# Patient Record
Sex: Female | Born: 1935 | Race: White | Hispanic: No | Smoking: Former smoker
Health system: Southern US, Community
[De-identification: ages and names within clinical notes are randomized; demographics above are authoritative.]

## PROBLEM LIST (undated history)

## (undated) DIAGNOSIS — K5730 Diverticulosis of large intestine without perforation or abscess without bleeding: Secondary | ICD-10-CM

## (undated) DIAGNOSIS — T4145XA Adverse effect of unspecified anesthetic, initial encounter: Secondary | ICD-10-CM

## (undated) DIAGNOSIS — E785 Hyperlipidemia, unspecified: Secondary | ICD-10-CM

## (undated) DIAGNOSIS — L719 Rosacea, unspecified: Secondary | ICD-10-CM

## (undated) DIAGNOSIS — C50911 Malignant neoplasm of unspecified site of right female breast: Secondary | ICD-10-CM

## (undated) DIAGNOSIS — G479 Sleep disorder, unspecified: Secondary | ICD-10-CM

## (undated) DIAGNOSIS — Z9889 Other specified postprocedural states: Secondary | ICD-10-CM

## (undated) DIAGNOSIS — K317 Polyp of stomach and duodenum: Secondary | ICD-10-CM

## (undated) DIAGNOSIS — M8580 Other specified disorders of bone density and structure, unspecified site: Secondary | ICD-10-CM

## (undated) DIAGNOSIS — K219 Gastro-esophageal reflux disease without esophagitis: Secondary | ICD-10-CM

## (undated) DIAGNOSIS — C439 Malignant melanoma of skin, unspecified: Secondary | ICD-10-CM

## (undated) DIAGNOSIS — K6389 Other specified diseases of intestine: Secondary | ICD-10-CM

## (undated) DIAGNOSIS — M818 Other osteoporosis without current pathological fracture: Secondary | ICD-10-CM

## (undated) DIAGNOSIS — D125 Benign neoplasm of sigmoid colon: Secondary | ICD-10-CM

## (undated) DIAGNOSIS — K5900 Constipation, unspecified: Secondary | ICD-10-CM

## (undated) DIAGNOSIS — T8859XA Other complications of anesthesia, initial encounter: Secondary | ICD-10-CM

## (undated) DIAGNOSIS — K5790 Diverticulosis of intestine, part unspecified, without perforation or abscess without bleeding: Secondary | ICD-10-CM

## (undated) DIAGNOSIS — Z170 Estrogen receptor positive status [ER+]: Secondary | ICD-10-CM

## (undated) DIAGNOSIS — G4730 Sleep apnea, unspecified: Secondary | ICD-10-CM

## (undated) DIAGNOSIS — M1990 Unspecified osteoarthritis, unspecified site: Secondary | ICD-10-CM

## (undated) DIAGNOSIS — T386X5A Adverse effect of antigonadotrophins, antiestrogens, antiandrogens, not elsewhere classified, initial encounter: Secondary | ICD-10-CM

## (undated) DIAGNOSIS — R12 Heartburn: Secondary | ICD-10-CM

## (undated) DIAGNOSIS — C801 Malignant (primary) neoplasm, unspecified: Secondary | ICD-10-CM

## (undated) DIAGNOSIS — G629 Polyneuropathy, unspecified: Secondary | ICD-10-CM

## (undated) DIAGNOSIS — R112 Nausea with vomiting, unspecified: Secondary | ICD-10-CM

## (undated) DIAGNOSIS — I10 Essential (primary) hypertension: Secondary | ICD-10-CM

## (undated) DIAGNOSIS — R351 Nocturia: Secondary | ICD-10-CM

## (undated) DIAGNOSIS — S72009A Fracture of unspecified part of neck of unspecified femur, initial encounter for closed fracture: Secondary | ICD-10-CM

## (undated) DIAGNOSIS — C186 Malignant neoplasm of descending colon: Secondary | ICD-10-CM

## (undated) DIAGNOSIS — C189 Malignant neoplasm of colon, unspecified: Secondary | ICD-10-CM

## (undated) HISTORY — DX: Adverse effect of antigonadotrophins, antiestrogens, antiandrogens, not elsewhere classified, initial encounter: T38.6X5A

## (undated) HISTORY — DX: Heartburn: R12

## (undated) HISTORY — DX: Benign neoplasm of sigmoid colon: D12.5

## (undated) HISTORY — DX: Malignant melanoma of skin, unspecified: C43.9

## (undated) HISTORY — DX: Malignant neoplasm of descending colon: C18.6

## (undated) HISTORY — PX: CHOLECYSTECTOMY: SHX55

## (undated) HISTORY — DX: Malignant neoplasm of colon, unspecified: C18.9

## (undated) HISTORY — DX: Constipation, unspecified: K59.00

## (undated) HISTORY — DX: Diverticulosis of intestine, part unspecified, without perforation or abscess without bleeding: K57.90

## (undated) HISTORY — DX: Diverticulosis of large intestine without perforation or abscess without bleeding: K57.30

## (undated) HISTORY — PX: BREAST SURGERY: SHX581

## (undated) HISTORY — PX: JOINT REPLACEMENT: SHX530

## (undated) HISTORY — DX: Other specified diseases of intestine: K63.89

## (undated) HISTORY — DX: Polyp of stomach and duodenum: K31.7

## (undated) HISTORY — DX: Estrogen receptor positive status (ER+): Z17.0

## (undated) HISTORY — DX: Malignant neoplasm of unspecified site of right female breast: C50.911

## (undated) HISTORY — DX: Other osteoporosis without current pathological fracture: M81.8

## (undated) HISTORY — DX: Essential (primary) hypertension: I10

## (undated) HISTORY — DX: Polyneuropathy, unspecified: G62.9

---
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
## 1971-06-01 HISTORY — PX: BACK SURGERY: SHX140

## 1996-05-31 HISTORY — PX: MASTECTOMY: SHX3

## 1999-06-01 HISTORY — PX: TOTAL KNEE ARTHROPLASTY: SHX125

## 2007-06-19 ENCOUNTER — Encounter: Admission: RE | Admit: 2007-06-19 | Discharge: 2007-06-19 | Payer: Self-pay | Admitting: Orthopedic Surgery

## 2007-12-18 ENCOUNTER — Encounter: Admission: RE | Admit: 2007-12-18 | Discharge: 2007-12-18 | Payer: Self-pay | Admitting: Orthopedic Surgery

## 2008-04-01 ENCOUNTER — Encounter: Admission: RE | Admit: 2008-04-01 | Discharge: 2008-04-01 | Payer: Self-pay | Admitting: Orthopedic Surgery

## 2008-05-31 HISTORY — PX: ABDOMINAL HYSTERECTOMY: SHX81

## 2008-05-31 HISTORY — PX: TOTAL HIP ARTHROPLASTY: SHX124

## 2008-08-12 ENCOUNTER — Inpatient Hospital Stay (HOSPITAL_COMMUNITY): Admission: RE | Admit: 2008-08-12 | Discharge: 2008-08-15 | Payer: Self-pay | Admitting: Orthopedic Surgery

## 2009-05-31 HISTORY — PX: CATARACT EXTRACTION: SUR2

## 2010-04-30 ENCOUNTER — Encounter: Payer: Self-pay | Admitting: Emergency Medicine

## 2010-04-30 ENCOUNTER — Inpatient Hospital Stay (HOSPITAL_COMMUNITY)
Admission: EM | Admit: 2010-04-30 | Discharge: 2010-05-01 | Payer: Self-pay | Source: Home / Self Care | Admitting: Emergency Medicine

## 2010-04-30 ENCOUNTER — Encounter (INDEPENDENT_AMBULATORY_CARE_PROVIDER_SITE_OTHER): Payer: Self-pay | Admitting: General Surgery

## 2010-05-31 HISTORY — PX: TUMOR REMOVAL: SHX12

## 2010-08-11 LAB — DIFFERENTIAL
Basophils Relative: 3 % — ABNORMAL HIGH (ref 0–1)
Lymphs Abs: 2.1 10*3/uL (ref 0.7–4.0)
Monocytes Absolute: 0.4 10*3/uL (ref 0.1–1.0)
Monocytes Relative: 7 % (ref 3–12)
Neutro Abs: 2.9 10*3/uL (ref 1.7–7.7)

## 2010-08-11 LAB — CBC
MCH: 28.3 pg (ref 26.0–34.0)
MCHC: 32.6 g/dL (ref 30.0–36.0)
MCHC: 33.7 g/dL (ref 30.0–36.0)
Platelets: 134 10*3/uL — ABNORMAL LOW (ref 150–400)
Platelets: 160 10*3/uL (ref 150–400)
RBC: 4.45 MIL/uL (ref 3.87–5.11)
RDW: 12.1 % (ref 11.5–15.5)
RDW: 13 % (ref 11.5–15.5)
WBC: 5.9 10*3/uL (ref 4.0–10.5)

## 2010-08-11 LAB — HEPATIC FUNCTION PANEL
Alkaline Phosphatase: 77 U/L (ref 39–117)
Bilirubin, Direct: 0.1 mg/dL (ref 0.0–0.3)
Indirect Bilirubin: 0.4 mg/dL (ref 0.3–0.9)
Total Bilirubin: 0.5 mg/dL (ref 0.3–1.2)
Total Protein: 6.4 g/dL (ref 6.0–8.3)

## 2010-08-11 LAB — URINALYSIS, ROUTINE W REFLEX MICROSCOPIC
Glucose, UA: NEGATIVE mg/dL
Nitrite: NEGATIVE
Specific Gravity, Urine: 1.02 (ref 1.005–1.030)
pH: 6 (ref 5.0–8.0)

## 2010-08-11 LAB — COMPREHENSIVE METABOLIC PANEL
Albumin: 4.4 g/dL (ref 3.5–5.2)
Alkaline Phosphatase: 118 U/L — ABNORMAL HIGH (ref 39–117)
BUN: 11 mg/dL (ref 6–23)
Calcium: 9.6 mg/dL (ref 8.4–10.5)
Potassium: 4.2 mEq/L (ref 3.5–5.1)
Sodium: 143 mEq/L (ref 135–145)
Total Protein: 7.9 g/dL (ref 6.0–8.3)

## 2010-08-11 LAB — URINE MICROSCOPIC-ADD ON

## 2010-08-11 LAB — CREATININE, SERUM: GFR calc Af Amer: >60 mL/min (ref >60)

## 2010-08-11 LAB — POTASSIUM: Potassium: 3.5 mEq/L (ref 3.5–5.1)

## 2010-09-10 LAB — BASIC METABOLIC PANEL
BUN: 6 mg/dL (ref 6–23)
CO2: 26 mEq/L (ref 19–32)
CO2: 27 mEq/L (ref 19–32)
Calcium: 8 mg/dL — ABNORMAL LOW (ref 8.4–10.5)
Calcium: 8.5 mg/dL (ref 8.4–10.5)
Chloride: 103 mEq/L (ref 96–112)
Chloride: 103 mEq/L (ref 96–112)
Chloride: 104 mEq/L (ref 96–112)
Creatinine, Ser: 0.61 mg/dL (ref 0.4–1.2)
Creatinine, Ser: 0.61 mg/dL (ref 0.4–1.2)
GFR calc Af Amer: >60 mL/min (ref >60)
GFR calc Af Amer: >60 mL/min (ref >60)
Glucose, Bld: 127 mg/dL — ABNORMAL HIGH (ref 70–99)
Glucose, Bld: 129 mg/dL — ABNORMAL HIGH (ref 70–99)
Glucose, Bld: 143 mg/dL — ABNORMAL HIGH (ref 70–99)
Potassium: 4 mEq/L (ref 3.5–5.1)
Potassium: 4.2 mEq/L (ref 3.5–5.1)
Sodium: 136 mEq/L (ref 135–145)

## 2010-09-10 LAB — URINALYSIS, ROUTINE W REFLEX MICROSCOPIC
Bilirubin Urine: NEGATIVE
Glucose, UA: NEGATIVE mg/dL
Hgb urine dipstick: NEGATIVE
Hgb urine dipstick: NEGATIVE
Ketones, ur: NEGATIVE mg/dL
Nitrite: NEGATIVE
Protein, ur: NEGATIVE mg/dL
Protein, ur: NEGATIVE mg/dL
Specific Gravity, Urine: 1.03 (ref 1.005–1.030)
Urobilinogen, UA: 1 mg/dL (ref 0.0–1.0)

## 2010-09-10 LAB — CBC
HCT: 25.1 % — ABNORMAL LOW (ref 36.0–46.0)
HCT: 31 % — ABNORMAL LOW (ref 36.0–46.0)
Hemoglobin: 10.5 g/dL — ABNORMAL LOW (ref 12.0–15.0)
Hemoglobin: 9 g/dL — ABNORMAL LOW (ref 12.0–15.0)
MCHC: 33.7 g/dL (ref 30.0–36.0)
MCHC: 33.8 g/dL (ref 30.0–36.0)
MCHC: 33.8 g/dL (ref 30.0–36.0)
MCV: 85.7 fL (ref 78.0–100.0)
MCV: 86.4 fL (ref 78.0–100.0)
MCV: 86.8 fL (ref 78.0–100.0)
Platelets: 129 10*3/uL — ABNORMAL LOW (ref 150–400)
Platelets: 149 10*3/uL — ABNORMAL LOW (ref 150–400)
RBC: 3.07 MIL/uL — ABNORMAL LOW (ref 3.87–5.11)
RBC: 3.61 MIL/uL — ABNORMAL LOW (ref 3.87–5.11)
RDW: 13.1 % (ref 11.5–15.5)
RDW: 13.4 % (ref 11.5–15.5)
RDW: 13.5 % (ref 11.5–15.5)
RDW: 13.1 % (ref 11.5–15.5)
WBC: 6.3 10*3/uL (ref 4.0–10.5)

## 2010-09-10 LAB — COMPREHENSIVE METABOLIC PANEL
ALT: 13 U/L (ref 0–35)
AST: 16 U/L (ref 0–37)
Albumin: 4 g/dL (ref 3.5–5.2)
Alkaline Phosphatase: 87 U/L (ref 39–117)
Chloride: 105 mEq/L (ref 96–112)
Potassium: 4 mEq/L (ref 3.5–5.1)
Sodium: 140 mEq/L (ref 135–145)
Total Protein: 7 g/dL (ref 6.0–8.3)

## 2010-09-10 LAB — ABO/RH: ABO/RH(D): O POS

## 2010-09-10 LAB — URINE MICROSCOPIC-ADD ON

## 2010-09-10 LAB — URINE CULTURE
Colony Count: NO GROWTH
Culture: NO GROWTH

## 2010-09-10 LAB — TYPE AND SCREEN: Antibody Screen: NEGATIVE

## 2010-09-10 LAB — PROTIME-INR: Prothrombin Time: 15.3 seconds — ABNORMAL HIGH (ref 11.6–15.2)

## 2010-10-13 NOTE — Discharge Summary (Signed)
Crystal Lee, Crystal Lee                ACCOUNT NO.:  [id number]   MEDICAL RECORD NO.:  [id number]          PATIENT TYPE:  INP   LOCATION:  1615                         FACILITY:  [facility]   PHYSICIAN:  Ollen Gross, M.D.    DATE OF BIRTH:  August 28, 1933   DATE OF ADMISSION:  08/12/2008  DATE OF DISCHARGE:  08/15/2008                               DISCHARGE SUMMARY   ADMITTING DIAGNOSES.:  1. Osteoarthritis right hip.  2. Shingles.  3. Cataracts.  4. Hypercholesterolemia.  5. Reflux disease.  6. History of urinary tract infections.  7. History of breast cancer.  8. Postmenopausal.  9. Childhood illnesses measles, mumps.   DISCHARGE DIAGNOSES:  1. Osteoarthritis right hip status post right total hip replacement      arthroplasty.  2. Postop acute blood loss anemia did not require transfusion.  3. Mild postop hyponatremia.  4. Shingles.  5. Cataracts.  6. Hypercholesterolemia.  7. Reflux disease.  8. History of urinary tract infections.  9. History of breast cancer.  10.Postmenopausal.  11.Childhood illnesses measles, mumps.   PROCEDURE:  On August 12, 2008, right total hip.  Surgeon Dr. Lequita Halt,  assistant Avel Peace PA-C.   ANESTHESIA:  General.   CONSULTATIONS:  None.   BRIEF HISTORY:  Crystal Lee is a 77 year old female with end-stage  arthritis right hip, progressive worsening pain dysfunction, failed  nonoperative management, now presents for a total hip arthroplasty.   LABORATORY DATA:  Preop CBC showed hemoglobin 13.9, hematocrit 41.3,  white cell count 6.3, platelets 149.  PT/INR 13.1 and 1.0 with PTT of  30.  Chem panel on admission, slightly elevated glucose of 126.  Remaining Chem panel all within normal limits.  Preop UA did show  moderate leukocytes, few epithelials, 7-10 white cells, few bacteria.  Serial CBCs were followed.  Hemoglobin dropped down to 10.5 then 9, last  H&H 8.5 and 25.1.  Serial protimes followed per Coumadin protocol.  Last  PT/INR 15.3 and  1.2.  Serial B mets followed.  Sodium did drop from 140-  134, came back up to 137.  Follow-up UA just showed trace leukocytes,  few squamous, only three to six white cells and few bacteria.  Culture  negative.  No growth.   X-RAYS:  Two-view chest preop August 06, 2008, no active disease right hip  film pelvis film on August 12, 2008, right hip replacement without  complicating feature.   EKG March 2010, normal sinus rhythm, left axis deviation, minimal  voltage criteria for LVH, unconfirmed.   HOSPITAL COURSE:  The patient was admitted to the [facility],  taken to OR, underwent above-stated procedure without complication.  The  patient tolerated seizure well, later transferred to the orthopedic  floor, started on PCA and p.o. analgesic pain control following surgery.  Given 12 hours postop IV antibiotics.  Had fair amount of pain through  the night, doing a little bit better on the morning of day one.  Encouraged p.o. medications.  Her main issues was nausea postop.  Could  have been the anesthesia or the PCA.  We discontinued the PCA  and  treated her symptoms of nausea that did resolve.  Likely it was from the  narcotic.  Hemoglobin looked good.  Started getting up, partial  weightbearing due to a total hip.  She had a borderline UTI, but we  rechecked a cath specimen postop.  Urine culture did prove to be  negative on that.  Started back on home medications.  She got up and  walked about 15-30 feet on day #1.  By day #2, she was doing a little  bit better, pain was under a little bit better control.  Nausea was  improved.  Hemoglobin was down to 9.  Started on some Nu-Iron.  Sodium  was a little low.  Felt to be due to a dilutional component.  Discontinue the fluids in her sodium came back up by postop day #3 to  137.  She was progressing well, walking over 100 feet on day #2 and by  day #3, she was doing well, tolerating her medications.  Hemoglobin was  low but she was  asymptomatic with this she was placed on iron  supplements and wanted to go home.   DISCHARGE/PLAN:  1. Discharged home on August 15, 2008.  2. Discharge diagnoses, please see above.  3. Discharge medications:  Nu-Iron, Coumadin, Lovenox for an      additional day at home, Vicodin for pain, Robaxin for spasm.  4. Diet as tolerated, low cholesterol.  5. Activity:  She is partial weightbearing to the right lower      extremity, 25-50% hip precautions, total hip protocol.  Home health      PT, home health nursing.  May start showering.  However, do not      submerge incision under water.  6. Follow-up in 2 weeks with Dr. Lequita Halt in the office.   DISPOSITION:  Home.   CONDITION ON DISCHARGE:  Slowly improving.      Alexzandrew L. Perkins, P.A.C.      Ollen Gross, M.D.  Electronically Signed    ALP/MEDQ  D:  08/15/2008  T:  08/15/2008  Job:  [id number]   cc:   Ollen Gross, M.D.  Fax: [phone]

## 2010-10-13 NOTE — Op Note (Signed)
NAMEERLA, BACCHI                ACCOUNT NO.:  [id number]   MEDICAL RECORD NO.:  [id number]          PATIENT TYPE:  INP   LOCATION:  0009                         FACILITY:  [facility]   PHYSICIAN:  Ollen Gross, M.D.    DATE OF BIRTH:  January 08, 1934   DATE OF PROCEDURE:  08/12/2008  DATE OF DISCHARGE:                               OPERATIVE REPORT   PREOPERATIVE DIAGNOSIS:  Osteoarthritis, right hip.   POSTOPERATIVE DIAGNOSIS:  Osteoarthritis, right hip.   PROCEDURE:  Right total hip arthroplasty.   SURGEON:  Ollen Gross, M.D.   ASSISTANT:  Avel Peace, PA-C.   ANESTHESIA:  General.   ESTIMATED BLOOD LOSS:  400.   DRAINS:  Hemovac x1.   COMPLICATIONS:  None.   CONDITION:  Stable to recovery.   BRIEF CLINICAL NOTE:  Ms. Vinluan is a 77 year old female who has end-  stage arthritis of the right hip with progressively worsening pain and  dysfunction.  She has failed nonoperative management and presents now  for right total hip arthroplasty.   PROCEDURE IN DETAIL:  After the successful administration of general  anesthetic, the patient was placed in the left lateral decubitus  position with the right side up and held with the hip positioner.  The  right lower extremity was isolated from her perineum with plastic drapes  and prepped and draped in the usual sterile fashion.  Extremity was  wrapped in Esmarch, knee flexed, tourniquet inflated to 300 mmHg.  Midline incision was made with a 10-blade through subcutaneous tissue to  the level of the fascia lata, which was incised in line with the skin  incision.  The sciatic nerve was palpated and protected and the short  rotators isolated from the femur.  Capsulectomy is performed and the hip  is dislocated.  The center of the femoral head is marked and a trial  prosthesis is placed, such that the center of the trial head corresponds  to the center of the native femoral head.  Osteotomy line is marked with  the femoral neck and  osteotomy made with an oscillating saw.  The  femoral head was removed and then the femur retracted anteriorly to gain  acetabular exposure.   Acetabular retractors were placed and labrum and osteophytes removed.  She had a very large hypertrophic torn labrum.  Once the labrum was  removed, then reaming starts at 45 mm, coursing increments of 2 to 55  mm.  A 56-mm Pinnacle acetabular shell is placed in anatomic position  and is not transfixed with any dome screws.  We had excellent purchase  with the cup itself.  A trial 36-mm neutral +4 liner was placed.   The femur was prepared with the canal finder and irrigation.  Axial  reaming is performed to 17.5 mm, proximal reaming with the 92F, and the  sleeve machined to a large.  A 92F large sleeve is placed with a 22 x 17  stem and a 36 +8 neck matching native anteversion.  A 36 +0 head is  placed, but it was a little bit too much laxity with that,  so I went to  a 36 +6 which had a nice stable reduction, with excellent stability  throughout.  She is stable to full extension, external rotation, 70  degrees flexion, 40 degrees adduction, 90 degrees internal rotation, and  90 degrees of flexion and 70 degrees of internal rotation.  By placing  the right leg on top of the left, it felt as though the leg lengths were  equal.  The trials were then removed and the permanent apex hole  eliminator was placed in the acetabular shell.  Permanent 36-mm neutral  +4 Marathon liner was placed.  On the femoral side I placed the  permanent 53F large sleeve with 22 by 17 stem and 36 +8 neck, matching  native anteversion.  A 36 +6 head is placed and the hip is reduced with  the same stability parameters.  By placing the right leg on top of the  left, I felt as though the leg lengths were equal.  The wound was then  copiously irrigated with saline solution and the short rotators  reattached to the femur through drill holes.  Fascia lata was closed  over a  Hemovac drain with interrupted #1 Vicryl, subcu closed with #1  and then #2-0 Vicryl and subcuticular running 4-0 Monocryl.  The  incision was then cleaned and dried and Steri-Strips and a bulky sterile  dressing applied.  A drain was hooked to suction and she was placed into  a knee immobilizer, awakened and transported to recovery in stable  condition.      Ollen Gross, M.D.  Electronically Signed     FA/MEDQ  D:  08/12/2008  T:  08/12/2008  Job:  [id number]

## 2010-10-16 NOTE — H&P (Signed)
Crystal Lee, Crystal Lee                ACCOUNT NO.:  [id number]   MEDICAL RECORD NO.:  [id number]          PATIENT TYPE:  INP   LOCATION:  NA                           FACILITY:  [facility]   PHYSICIAN:  Ollen Gross, M.D.    DATE OF BIRTH:  13-Apr-1934   DATE OF ADMISSION:  08/12/2008  DATE OF DISCHARGE:                              HISTORY & PHYSICAL   CHIEF COMPLAINT:  Right hip pain.   HISTORY OF PRESENT ILLNESS:  Patient is a 77 year old female who has  been seen by Dr. Lequita Halt for ongoing progressive right hip pain.  She is  known to have significant arthritis with progressive worsening pain and  dysfunction.  It was felt she would benefit from undergoing surgery.  Risks and benefits have been discussed and she elected to proceed with  surgery.   ALLERGIES:  NO KNOWN DRUG ALLERGIES.   INTOLERANCES:  1. DARVON CAUSES NAUSEA AND VOMITING.  2. MORPHINE CAUSES SICKNESS.  3. ADHESIVE BAND-AIDS CAUSE A LOCAL REACTION (PAPER TAPE IS OKAY).   CURRENT MEDICATIONS:  1. Pravastatin.  2. Patanol eye drops.  3. MetroGel.   PAST MEDICAL HISTORY:  1. Shingles.  2. Cataracts.  3. Hypercholesterolemia.  4. Reflux disease which she takes over-the-counter Nexium or Prilosec.  5. Urinary tract infections.  6. Breast cancer.  7. Postmenopausal.  8. Childhood illnesses of measles and mumps.   PAST SURGICAL HISTORY:  1. Appendectomy in 1947.  2. Back surgery, 1973.  3. Hysterectomy in 1999.  4. Breast cancer surgery which was a double mastectomy in 1998.  5. Knee replacement, 2001, was done in [facility].   SOCIAL HISTORY:  Married.  Retired.  Past smoker, quit in 1978.  No  alcohol.  Three children.  Does have a caregiver lined up.  She does  have a living will, healthcare power of attorney.  Two steps entering at  1-level home.   FAMILY HISTORY:  Son who died in an accident.   REVIEW OF SYSTEMS:  GENERAL:  No fevers, chills, or night sweats.  NEURO:  No seizure, syncope, or paralysis.   RESPIRATORY:  No shortness  of breath, productive cough, or hemoptysis.  CARDIOVASCULAR:  No chest  pain, angina, or orthopnea.  GI:  A little bit of constipation.  No  nausea, vomiting, or diarrhea.  GU:  A little bit of nocturia.  No  dysuria or hematuria.  Musculoskeletal:  Right hip.   PHYSICAL EXAM:  VITAL SIGNS:  Pulse 92.  Respirations 16.  She has had a  bilateral mastectomy so it is recommended that she use a leg cuff which  I do not have.  We will check her blood pressure on admission.  GENERAL:  A 74-year white female, well nourished, well developed,  overweight, obese, alert, oriented, and cooperative.  HEENT:  Normocephalic, atraumatic.  Pupils equal, round, and reactive.  EOMs intact.  NECK:  Supple.  CHEST:  Clear.  HEART:  Regular rate and rhythm.  No murmur.  S1 and S2 noted.  ABDOMEN:  Soft, protuberant abdomen.  Bowel sounds present.  RECTAL:  Not  done, not pertinent to present illness.  BREAST:  Not done, not pertinent to present illness.  GENITALIA:  Not done, not pertinent to present illness.  EXTREMITIES:  Right hip flexion 95, internal rotation 0, external  rotation 20, abduction 20.   IMPRESSION:  Osteoarthritis, right hip.   PLAN:  Patient admitted to [facility] to undergo a right total  hip arthroplasty.  Surgery will be performed by Dr. Ollen Gross.      Alexzandrew L. Perkins, P.A.C.      Ollen Gross, M.D.  Electronically Signed    ALP/MEDQ  D:  08/08/2008  T:  08/08/2008  Job:  [id number]

## 2012-11-15 ENCOUNTER — Emergency Department (HOSPITAL_BASED_OUTPATIENT_CLINIC_OR_DEPARTMENT_OTHER)
Admission: EM | Admit: 2012-11-15 | Discharge: 2012-11-15 | Disposition: A | Discharge status: Home / Self Care | Payer: Medicare Other | Attending: Emergency Medicine | Admitting: Emergency Medicine

## 2012-11-15 ENCOUNTER — Encounter (HOSPITAL_BASED_OUTPATIENT_CLINIC_OR_DEPARTMENT_OTHER): Payer: Self-pay | Admitting: *Deleted

## 2012-11-15 ENCOUNTER — Emergency Department (HOSPITAL_BASED_OUTPATIENT_CLINIC_OR_DEPARTMENT_OTHER): Payer: Medicare Other

## 2012-11-15 DIAGNOSIS — N390 Urinary tract infection, site not specified: Secondary | ICD-10-CM | POA: Insufficient documentation

## 2012-11-15 DIAGNOSIS — Z79899 Other long term (current) drug therapy: Secondary | ICD-10-CM | POA: Insufficient documentation

## 2012-11-15 DIAGNOSIS — Z9089 Acquired absence of other organs: Secondary | ICD-10-CM | POA: Insufficient documentation

## 2012-11-15 DIAGNOSIS — R109 Unspecified abdominal pain: Secondary | ICD-10-CM | POA: Insufficient documentation

## 2012-11-15 DIAGNOSIS — R1110 Vomiting, unspecified: Secondary | ICD-10-CM | POA: Insufficient documentation

## 2012-11-15 DIAGNOSIS — Z8739 Personal history of other diseases of the musculoskeletal system and connective tissue: Secondary | ICD-10-CM | POA: Insufficient documentation

## 2012-11-15 DIAGNOSIS — E785 Hyperlipidemia, unspecified: Secondary | ICD-10-CM | POA: Insufficient documentation

## 2012-11-15 DIAGNOSIS — R6883 Chills (without fever): Secondary | ICD-10-CM | POA: Insufficient documentation

## 2012-11-15 HISTORY — DX: Hyperlipidemia, unspecified: E78.5

## 2012-11-15 HISTORY — DX: Unspecified osteoarthritis, unspecified site: M19.90

## 2012-11-15 LAB — URINALYSIS, ROUTINE W REFLEX MICROSCOPIC
Hgb urine dipstick: NEGATIVE
Nitrite: NEGATIVE
Protein, ur: NEGATIVE mg/dL
Urobilinogen, UA: 0.2 mg/dL (ref 0.0–1.0)

## 2012-11-15 LAB — BASIC METABOLIC PANEL
Calcium: 10.2 mg/dL (ref 8.4–10.5)
Creatinine, Ser: 0.7 mg/dL (ref 0.50–1.10)
GFR calc Af Amer: >90 mL/min (ref >90)
GFR calc non Af Amer: 81 mL/min — ABNORMAL LOW (ref >90)

## 2012-11-15 LAB — CBC WITH DIFFERENTIAL/PLATELET
Basophils Absolute: 0 10*3/uL (ref 0.0–0.1)
Basophils Relative: 0 % (ref 0–1)
Eosinophils Relative: 6 % — ABNORMAL HIGH (ref 0–5)
HCT: 44.1 % (ref 36.0–46.0)
MCHC: 34 g/dL (ref 30.0–36.0)
MCV: 87.5 fL (ref 78.0–100.0)
Monocytes Absolute: 0.5 10*3/uL (ref 0.1–1.0)
Neutro Abs: 4 10*3/uL (ref 1.7–7.7)
RDW: 13 % (ref 11.5–15.5)

## 2012-11-15 LAB — URINE MICROSCOPIC-ADD ON

## 2012-11-15 LAB — TROPONIN I: Troponin I: <0.3 ng/mL (ref <0.30)

## 2012-11-15 MED ORDER — CEPHALEXIN 500 MG PO CAPS: 500.0000 mg | ORAL_CAPSULE | Freq: Two times a day (BID) | ORAL | Status: DC

## 2012-11-15 MED ORDER — SODIUM CHLORIDE 0.9 % IV BOLUS (SEPSIS)
250.0000 mL | Freq: Once | INTRAVENOUS | Status: AC
  Administered 2012-11-15: 250 mL via INTRAVENOUS

## 2012-11-15 MED ORDER — CEPHALEXIN 250 MG PO CAPS
500.0000 mg | ORAL_CAPSULE | Freq: Once | ORAL | Status: AC
  Administered 2012-11-15: 500 mg via ORAL
  Filled 2012-11-15: qty 2

## 2012-11-15 NOTE — ED Provider Notes (Signed)
History     CSN: [id number]  Arrival date & time 11/15/12  1131   First MD Initiated Contact with Patient 11/15/12 1205      Chief Complaint  Patient presents with  . Urinary Frequency  . Abdominal Pain    (Consider location/radiation/quality/duration/timing/severity/associated sxs/prior treatment) HPI Comments: Pt states that about one week ago her had multiple episodes of vomiting for about 24 hours:pt states that since then she has been having urinary symptoms and feeling run down:pt c/o lower abdominal pain consistent with previous uti  Patient is a 79 y.o. female presenting with frequency. The history is provided by the patient. No language interpreter was used.  Urinary Frequency This is a new problem. The current episode started today. The problem occurs constantly. The problem has been unchanged. Associated symptoms include abdominal pain, chills and vomiting. Nothing aggravates the symptoms. She has tried nothing for the symptoms.    Past Medical History  Diagnosis Date  . Arthritis   . Hyperlipidemia     Past Surgical History  Procedure Laterality Date  . Breast surgery    . Cholecystectomy    . Total hip arthroplasty    . Total knee revision      History reviewed. No pertinent family history.  History  Substance Use Topics  . Smoking status: Never Smoker   . Smokeless tobacco: Not on file  . Alcohol Use: No    OB History   Grav Para Term Preterm Abortions TAB SAB Ect Mult Living                  Review of Systems  Constitutional: Positive for chills.  Respiratory: Negative.   Gastrointestinal: Positive for vomiting and abdominal pain.  Genitourinary: Positive for frequency.    Allergies  Review of patient's allergies indicates no known allergies.  Home Medications   Current Outpatient Rx  Name  Route  Sig  Dispense  Refill  . simvastatin (ZOCOR) 10 MG tablet   Oral   Take 10 mg by mouth at bedtime.           BP 158/74  Pulse 91   Temp(Src) 97.8 F (36.6 C) (Oral)  Resp 24  SpO2 96%  Physical Exam  Nursing note and vitals reviewed. Constitutional: She is oriented to person, place, and time. She appears well-developed and well-nourished.  HENT:  Head: Normocephalic and atraumatic.  Eyes: Conjunctivae and EOM are normal.  Neck: Neck supple.  Cardiovascular: Normal rate and regular rhythm.   Pulmonary/Chest: Effort normal and breath sounds normal.  Abdominal: Soft. Bowel sounds are normal.  Suprapubic tenderness  Musculoskeletal: Normal range of motion.  Neurological: She is alert and oriented to person, place, and time.  Skin: Skin is warm and dry.  Psychiatric: She has a normal mood and affect.    ED Course  Procedures (including critical care time)  Labs Reviewed  URINALYSIS, ROUTINE W REFLEX MICROSCOPIC - Abnormal; Notable for the following:    APPearance CLOUDY (*)    Leukocytes, UA MODERATE (*)    All other components within normal limits  URINE MICROSCOPIC-ADD ON - Abnormal; Notable for the following:    Bacteria, UA FEW (*)    All other components within normal limits  CBC WITH DIFFERENTIAL - Abnormal; Notable for the following:    Eosinophils Relative 6 (*)    All other components within normal limits  BASIC METABOLIC PANEL - Abnormal; Notable for the following:    Glucose, Bld 110 (*)  GFR calc non Af Amer 81 (*)    All other components within normal limits  URINE CULTURE  TROPONIN I   Dg Chest 2 View  11/15/2012   *RADIOLOGY REPORT*  Clinical Data: Shortness of breath  CHEST - 2 VIEW  Comparison: 04/30/2010  Findings: The cardiac shadow is stable.  The lungs are well aerated without focal infiltrate or sizable effusion.  No bony abnormality is noted.  IMPRESSION: No acute abnormalities seen.   Original Report Authenticated By: Alcide Clever, M.D.    Date: 11/15/2012  Rate: 70  Rhythm: normal sinus rhythm  QRS Axis: normal  Intervals: normal  ST/T Wave abnormalities: normal   Conduction Disutrbances:none  Narrative Interpretation:   Old EKG Reviewed: unchanged    1. UTI (lower urinary tract infection)       MDM  Pt vitals stable:pt is okay to follow up for worsening symptoms:will treat for uti        Teressa Lower, NP 11/15/12 1408

## 2012-11-15 NOTE — ED Provider Notes (Signed)
Medical screening examination/treatment/procedure(s) were performed by non-physician practitioner and as supervising physician I was immediately available for consultation/collaboration.  Queenie Aufiero, MD 11/15/12 2055 

## 2012-11-15 NOTE — ED Notes (Signed)
From out of town lives in [place] has been feeling like she has a urinary infection frequent urination and feels washed out

## 2012-11-16 LAB — URINE CULTURE: Colony Count: NO GROWTH

## 2013-05-31 HISTORY — PX: GALLBLADDER SURGERY: SHX652

## 2014-05-31 HISTORY — PX: SKIN CANCER EXCISION: SHX779

## 2015-03-24 ENCOUNTER — Encounter: Payer: Self-pay | Admitting: Gastroenterology

## 2015-04-22 ENCOUNTER — Ambulatory Visit: Payer: Self-pay | Admitting: Orthopedic Surgery

## 2015-04-22 NOTE — Progress Notes (Signed)
Preoperative surgical orders have been place into the Epic hospital system for [facility] on 04/22/2015, 5:16 PM  by Mickel Crow for surgery on 05-09-2015.  Preop Total Hip - Anterior Approach orders including IV Tylenol, and IV Decadron as long as there are no contraindications to the above medications. Arlee Muslim, PA-C

## 2015-04-28 NOTE — Progress Notes (Signed)
Late entry for 04/23/15-  Unable to leave message at [phone] and rings-  [phone] states voice mail has not been set up      PST  [facility] Aima Mcwhirt 04/28/15  1130-  Same as the above Verified phone number with Evelena Asa at Dr Tresa Moore office.  Again tried  [phone] and voice mail had not been set up.  I notified Evelena Asa who stated patient has pre op with Dian Situ day before surgery and that patient lives in [place] and may have same day labs.

## 2015-05-01 NOTE — Progress Notes (Signed)
Late entry for 04/30/15  Nurse Gillian Shields was able to get through to pt who stated she was in [place] at the beauty shop and could come anytime for pre op labs and appt, just call her back to set this up.  This nurse tried x 3 on 04/30/15 afternoon and got same problem as before  Voice mail has not been set up.  I have tried x 4 today, beginning at 1000 and can not get past the same recording.  I just called W caton at [place] and informed her that we can not get it touch with patient to set up labs, and left message looks like will have to be same day labs as we cannot reach patient

## 2015-05-05 ENCOUNTER — Encounter (HOSPITAL_COMMUNITY): Payer: Self-pay

## 2015-05-05 ENCOUNTER — Encounter (HOSPITAL_COMMUNITY)
Admission: RE | Admit: 2015-05-05 | Discharge: 2015-05-05 | Disposition: A | Discharge status: Home / Self Care | Payer: Medicare Other | Source: Ambulatory Visit | Attending: Orthopedic Surgery | Admitting: Orthopedic Surgery

## 2015-05-05 DIAGNOSIS — Z01818 Encounter for other preprocedural examination: Secondary | ICD-10-CM | POA: Diagnosis not present

## 2015-05-05 DIAGNOSIS — Z7901 Long term (current) use of anticoagulants: Secondary | ICD-10-CM | POA: Diagnosis not present

## 2015-05-05 HISTORY — DX: Sleep apnea, unspecified: G47.30

## 2015-05-05 HISTORY — DX: Nocturia: R35.1

## 2015-05-05 HISTORY — DX: Sleep disorder, unspecified: G47.9

## 2015-05-05 HISTORY — DX: Other complications of anesthesia, initial encounter: T88.59XA

## 2015-05-05 HISTORY — DX: Nausea with vomiting, unspecified: R11.2

## 2015-05-05 HISTORY — DX: Rosacea, unspecified: L71.9

## 2015-05-05 HISTORY — DX: Malignant (primary) neoplasm, unspecified: C80.1

## 2015-05-05 HISTORY — DX: Other specified disorders of bone density and structure, unspecified site: M85.80

## 2015-05-05 HISTORY — DX: Adverse effect of unspecified anesthetic, initial encounter: T41.45XA

## 2015-05-05 HISTORY — DX: Gastro-esophageal reflux disease without esophagitis: K21.9

## 2015-05-05 HISTORY — DX: Fracture of unspecified part of neck of unspecified femur, initial encounter for closed fracture: S72.009A

## 2015-05-05 HISTORY — DX: Nausea with vomiting, unspecified: Z98.890

## 2015-05-05 LAB — URINALYSIS, ROUTINE W REFLEX MICROSCOPIC
Glucose, UA: NEGATIVE mg/dL
HGB URINE DIPSTICK: NEGATIVE
Ketones, ur: NEGATIVE mg/dL
NITRITE: NEGATIVE
PROTEIN: NEGATIVE mg/dL
SPECIFIC GRAVITY, URINE: 1.023 (ref 1.005–1.030)
pH: 5.5 (ref 5.0–8.0)

## 2015-05-05 LAB — COMPREHENSIVE METABOLIC PANEL
ALBUMIN: 3.9 g/dL (ref 3.5–5.0)
ALK PHOS: 146 U/L — AB (ref 38–126)
ALT: 25 U/L (ref 14–54)
AST: 30 U/L (ref 15–41)
Anion gap: 7 (ref 5–15)
BUN: 12 mg/dL (ref 6–20)
CALCIUM: 9.3 mg/dL (ref 8.9–10.3)
CO2: 29 mmol/L (ref 22–32)
CREATININE: 0.6 mg/dL (ref 0.44–1.00)
Chloride: 100 mmol/L — ABNORMAL LOW (ref 101–111)
GFR calc Af Amer: >60 mL/min (ref >60)
GFR calc non Af Amer: >60 mL/min (ref >60)
GLUCOSE: 91 mg/dL (ref 65–99)
Potassium: 3.9 mmol/L (ref 3.5–5.1)
SODIUM: 136 mmol/L (ref 135–145)
TOTAL PROTEIN: 7.5 g/dL (ref 6.5–8.1)
Total Bilirubin: 0.7 mg/dL (ref 0.3–1.2)

## 2015-05-05 LAB — CBC
HCT: 42.2 % (ref 36.0–46.0)
HEMOGLOBIN: 13.6 g/dL (ref 12.0–15.0)
MCH: 28.8 pg (ref 26.0–34.0)
MCHC: 32.2 g/dL (ref 30.0–36.0)
MCV: 89.2 fL (ref 78.0–100.0)
Platelets: 157 10*3/uL (ref 150–400)
RBC: 4.73 MIL/uL (ref 3.87–5.11)
RDW: 13.3 % (ref 11.5–15.5)
WBC: 6.5 10*3/uL (ref 4.0–10.5)

## 2015-05-05 LAB — SURGICAL PCR SCREEN
MRSA, PCR: NEGATIVE
STAPHYLOCOCCUS AUREUS: NEGATIVE

## 2015-05-05 LAB — URINE MICROSCOPIC-ADD ON: RBC / HPF: NONE SEEN RBC/hpf (ref 0–5)

## 2015-05-05 LAB — PROTIME-INR
INR: 0.99 (ref 0.00–1.49)
Prothrombin Time: 13.3 seconds (ref 11.6–15.2)

## 2015-05-05 LAB — APTT: APTT: 32 s (ref 24–37)

## 2015-05-05 NOTE — Patient Instructions (Addendum)
YOUR PROCEDURE IS SCHEDULED ON : 05/08/14  REPORT TO [facility] MAIN ENTRANCE FOLLOW SIGNS TO EAST ELEVATOR - GO TO 3rd FLOOR CHECK IN AT 3 EAST NURSES STATION (SHORT STAY) AT:  1:45 PM  CALL THIS NUMBER IF YOU HAVE PROBLEMS THE MORNING OF SURGERY [phone]  REMEMBER:ONLY 1 PER PERSON MAY GO TO SHORT STAY WITH YOU TO GET READY THE MORNING OF YOUR SURGERY  DO NOT EAT FOOD  AFTER MIDNIGHT  MAY HAVE CLEAR LIQUIDS UNTIL 9:45 AM  TAKE THESE MEDICINES THE MORNING OF SURGERY:  LIPITOR / MACROBID / HYDROCODONE  CLEAR LIQUID DIET  Foods Allowed                                                                     Foods Excluded  Coffee and tea, regular and decaf                             liquids that you cannot  Plain Jell-O in any flavor                                             see through such as: Fruit ices (not with fruit pulp)                                     milk, soups, orange juice  Iced Popsicles                                                           All solid food Carbonated beverages, regular and diet                                    Cranberry, grape and apple juices Sports drinks like Gatorade Lightly seasoned clear broth or consume(fat free) Sugar, honey syrup  _____________________________________________________________________    YOU MAY NOT HAVE ANY METAL ON YOUR BODY INCLUDING HAIR PINS AND PIERCING'S. DO NOT WEAR JEWELRY, MAKEUP, LOTIONS, POWDERS OR PERFUMES. DO NOT WEAR NAIL POLISH. DO NOT SHAVE 48 HRS PRIOR TO SURGERY. MEN MAY SHAVE FACE AND NECK.  DO NOT [facility]. [facility] IS NOT RESPONSIBLE FOR VALUABLES.  CONTACTS, DENTURES OR PARTIALS MAY NOT BE WORN TO SURGERY. LEAVE SUITCASE IN CAR. CAN BE BROUGHT TO ROOM AFTER SURGERY.  PATIENTS DISCHARGED THE DAY OF SURGERY WILL NOT BE ALLOWED TO DRIVE HOME.  PLEASE READ OVER THE FOLLOWING INSTRUCTION  SHEETS _________________________________________________________________________________                                          [facility] - PREPARING FOR SURGERY  Before surgery,  you can play an important role.  Because skin is not sterile, your skin needs to be as free of germs as possible.  You can reduce the number of germs on your skin by washing with CHG (chlorahexidine gluconate) soap before surgery.  CHG is an antiseptic cleaner which kills germs and bonds with the skin to continue killing germs even after washing. Please DO NOT use if you have an allergy to CHG or antibacterial soaps.  If your skin becomes reddened/irritated stop using the CHG and inform your nurse when you arrive at Short Stay. Do not shave (including legs and underarms) for at least 48 hours prior to the first CHG shower.  You may shave your face. Please follow these instructions carefully:   1.  Shower with CHG Soap the night before surgery and the  morning of Surgery.   2.  If you choose to wash your hair, wash your hair first as usual with your  normal  Shampoo.   3.  After you shampoo, rinse your hair and body thoroughly to remove the  shampoo.                                         4.  Use CHG as you would any other liquid soap.  You can apply chg directly  to the skin and wash . Gently wash with scrungie or clean wascloth    5.  Apply the CHG Soap to your body ONLY FROM THE NECK DOWN.   Do not use on open                           Wound or open sores. Avoid contact with eyes, ears mouth and genitals (private parts).                        Genitals (private parts) with your normal soap.              6.  Wash thoroughly, paying special attention to the area where your surgery  will be performed.   7.  Thoroughly rinse your body with warm water from the neck down.   8.  DO NOT shower/wash with your normal soap after using and rinsing off  the CHG Soap .                9.  Pat yourself dry with a clean  towel.             10.  Wear clean night clothes to bed after shower             11.  Place clean sheets on your bed the night of your first shower and do not  sleep with pets.  Day of Surgery : Do not apply any lotions/deodorants the morning of surgery.  Please wear clean clothes to the hospital/surgery center.  FAILURE TO FOLLOW THESE INSTRUCTIONS MAY RESULT IN THE CANCELLATION OF YOUR SURGERY    PATIENT SIGNATURE_________________________________  ______________________________________________________________________     Crystal Lee  An incentive spirometer is a tool that can help keep your lungs clear and active. This tool measures how well you are filling your lungs with each breath. Taking long deep breaths may help reverse or decrease the chance of developing breathing (pulmonary) problems (especially infection) following:  A long period of  time when you are unable to move or be active. BEFORE THE PROCEDURE   If the spirometer includes an indicator to show your best effort, your nurse or respiratory therapist will set it to a desired goal.  If possible, sit up straight or lean slightly forward. Try not to slouch.  Hold the incentive spirometer in an upright position. INSTRUCTIONS FOR USE   Sit on the edge of your bed if possible, or sit up as far as you can in bed or on a chair.  Hold the incentive spirometer in an upright position.  Breathe out normally.  Place the mouthpiece in your mouth and seal your lips tightly around it.  Breathe in slowly and as deeply as possible, raising the piston or the ball toward the top of the column.  Hold your breath for 3-5 seconds or for as long as possible. Allow the piston or ball to fall to the bottom of the column.  Remove the mouthpiece from your mouth and breathe out normally.  Rest for a few seconds and repeat Steps 1 through 7 at least 10 times every 1-2 hours when you are awake. Take your time and take a few  normal breaths between deep breaths.  The spirometer may include an indicator to show your best effort. Use the indicator as a goal to work toward during each repetition.  After each set of 10 deep breaths, practice coughing to be sure your lungs are clear. If you have an incision (the cut made at the time of surgery), support your incision when coughing by placing a pillow or rolled up towels firmly against it. Once you are able to get out of bed, walk around indoors and cough well. You may stop using the incentive spirometer when instructed by your caregiver.  RISKS AND COMPLICATIONS  Take your time so you do not get dizzy or light-headed.  If you are in pain, you may need to take or ask for pain medication before doing incentive spirometry. It is harder to take a deep breath if you are having pain. AFTER USE  Rest and breathe slowly and easily.  It can be helpful to keep track of a log of your progress. Your caregiver can provide you with a simple table to help with this. If you are using the spirometer at home, follow these instructions: [place] IF:   You are having difficultly using the spirometer.  You have trouble using the spirometer as often as instructed.  Your pain medication is not giving enough relief while using the spirometer.  You develop fever of 100.5 F (38.1 C) or higher. SEEK IMMEDIATE MEDICAL CARE IF:   You cough up bloody sputum that had not been present before.  You develop fever of 102 F (38.9 C) or greater.  You develop worsening pain at or near the incision site. MAKE SURE YOU:   Understand these instructions.  Will watch your condition.  Will get help right away if you are not doing well or get worse. Document Released: 09/27/2006 Document Revised: 08/09/2011 Document Reviewed: 11/28/2006 Exit[employer] Patient Information 2014 Exit[employer], [place].   ________________________________________________________________________  WHAT IS A BLOOD  TRANSFUSION? Blood Transfusion Information  A transfusion is the replacement of blood or some of its parts. Blood is made up of multiple cells which provide different functions.  Red blood cells carry oxygen and are used for blood loss replacement.  White blood cells fight against infection.  Platelets control bleeding.  Plasma helps clot blood.  Other blood products are available for specialized needs, such as hemophilia or other clotting disorders. BEFORE THE TRANSFUSION  Who gives blood for transfusions?   Healthy volunteers who are fully evaluated to make sure their blood is safe. This is blood bank blood. Transfusion therapy is the safest it has ever been in the practice of medicine. Before blood is taken from a donor, a complete history is taken to make sure that person has no history of diseases nor engages in risky social behavior (examples are intravenous drug use or sexual activity with multiple partners). The donor's travel history is screened to minimize risk of transmitting infections, such as malaria. The donated blood is tested for signs of infectious diseases, such as HIV and hepatitis. The blood is then tested to be sure it is compatible with you in order to minimize the chance of a transfusion reaction. If you or a relative donates blood, this is often done in anticipation of surgery and is not appropriate for emergency situations. It takes many days to process the donated blood. RISKS AND COMPLICATIONS Although transfusion therapy is very safe and saves many lives, the main dangers of transfusion include:   Getting an infectious disease.  Developing a transfusion reaction. This is an allergic reaction to something in the blood you were given. Every precaution is taken to prevent this. The decision to have a blood transfusion has been considered carefully by your caregiver before blood is given. Blood is not given unless the benefits outweigh the risks. AFTER THE  TRANSFUSION  Right after receiving a blood transfusion, you will usually feel much better and more energetic. This is especially true if your red blood cells have gotten low (anemic). The transfusion raises the level of the red blood cells which carry oxygen, and this usually causes an energy increase.  The nurse administering the transfusion will monitor you carefully for complications. HOME CARE INSTRUCTIONS  No special instructions are needed after a transfusion. You may find your energy is better. Speak with your caregiver about any limitations on activity for underlying diseases you may have. SEEK MEDICAL CARE IF:   Your condition is not improving after your transfusion.  You develop redness or irritation at the intravenous (IV) site. SEEK IMMEDIATE MEDICAL CARE IF:  Any of the following symptoms occur over the next 12 hours:  Shaking chills.  You have a temperature by mouth above 102 F (38.9 C), not controlled by medicine.  Chest, back, or muscle pain.  People around you feel you are not acting correctly or are confused.  Shortness of breath or difficulty breathing.  Dizziness and fainting.  You get a rash or develop hives.  You have a decrease in urine output.  Your urine turns a dark color or changes to pink, red, or brown. Any of the following symptoms occur over the next 10 days:  You have a temperature by mouth above 102 F (38.9 C), not controlled by medicine.  Shortness of breath.  Weakness after normal activity.  The white part of the eye turns yellow (jaundice).  You have a decrease in the amount of urine or are urinating less often.  Your urine turns a dark color or changes to pink, red, or brown. Document Released: 05/14/2000 Document Revised: 08/09/2011 Document Reviewed: 01/01/2008 [facility] Patient Information 2014 [place], [place].  _______________________________________________________________________

## 2015-05-05 NOTE — Progress Notes (Signed)
Abnormal UA faxed to Dr.Aluisio 

## 2015-05-08 ENCOUNTER — Ambulatory Visit: Payer: Self-pay | Admitting: Orthopedic Surgery

## 2015-05-08 NOTE — H&P (Signed)
Crystal Lee DOB: July 27, 1933 Married / Language: English / Race: White Female Date of Admission:  05/09/2015 CC:  Left Hip Pain History of Present Illness The patient is a 81 year old female who comes in for a preoperative History and Physical. The patient is scheduled for a left total hip arthroplasty (anterior) to be performed by Dr. Dione Plover. Aluisio, MD at [facility] on 05-09-2015. The patient is a 81 year old female who presented for follow up of their hip. The patient is being followed for their left hip pain and osteoarthritis. She was on vacation a while back and fell down on the toliet due to it sitting low. She has lateral hip and groin pain. Unable to sleep on lateral side. Symptoms reported include: pain (severe). The patient feels that they are doing poorly and report their pain level to be severe. Current treatment includes: pain medications. The following medication has been used for pain control: antiinflammatory medication and Hydrocodone. Due to continued pain, she was sent for an MRI.  She was found to have a real significant stress reaction in femoral head and femoral neck. She continued with significant pain despite conservative measures. She has got a bad problem going on with that big stress reaction and with the degenerative changes. She was heading toward a hip replacement. She wanted to go ahead and get the hip replaced. She is subsequently admitted for surgery. They have been treated conservatively in the past for the above stated problem and despite conservative measures, they continue to have progressive pain and severe functional limitations and dysfunction. They have failed non-operative management including home exercise, medications. It is felt that they would benefit from undergoing total joint replacement. Risks and benefits of the procedure have been discussed with the patient and they elect to proceed with surgery. There are no active contraindications to  surgery such as ongoing infection or rapidly progressive neurological disease.  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 father  Social History Living situation  live with spouse Illicit drug use  no Tobacco / smoke exposure  no Marital status  married Exercise  Exercises rarely; does running / walking Current work status  retired Children  3 Drug/Alcohol Rehab (Previously)  no Drug/Alcohol Rehab (Currently)  no Tobacco use  Former smoker. former smoker; smoke(d) less than 1/2 pack(s) per day Alcohol use  never consumed alcohol  Medication History Atorvastatin Calcium (20MG  Tablet, Oral) Active. Cyanocobalamin (1000MCG Tablet, Oral) Active. Fosamax Plus D (70-5600MG -UNIT Tablet, Oral) Active. Ointment for Rosacea Active. (unsure of name) Vitamin D (50000UNIT Capsule, Oral) Active. Hydrocodone-Acetaminophen (5-325MG  Tablet, Oral) Active. (only on occasion)  Past Surgical History Total Knee Replacement  right Total Hip Replacement  right Hysterectomy  partial (cancerous) Gallbladder Surgery  open Spinal Surgery  Mastectomy  left and right Cataract Surgery  left and right Arthroscopy of Knee  bilateral Colon Polyp Removal - Open  Colon Polyp Removal - Colonoscopy  Appendectomy  Review of Systems) General Not Present- Chills,  Fatigue, Fever, Memory Loss, Night Sweats, Weight Gain and Weight Loss. Skin Not Present- Eczema, Hives, Itching, Lesions and Rash. HEENT Not Present- Dentures, Double Vision, Headache, Hearing Loss, Tinnitus and Visual Loss. Respiratory Not Present- Allergies, Chronic Cough, Coughing up blood, Shortness of breath at rest and Shortness of breath with exertion. Cardiovascular Not Present- Chest Pain, Difficulty Breathing Lying Down, Murmur, Palpitations, Racing/skipping heartbeats and Swelling. Gastrointestinal Not Present- Abdominal Pain, Bloody Stool, Constipation, Diarrhea, Difficulty Swallowing, Heartburn, Jaundice, Loss of appetitie, Nausea and Vomiting. Female Genitourinary Present- Incontinence and Urinary frequency. Not Present- Blood in Urine, Discharge, Flank Pain, Painful Urination, Urgency, Urinary Retention, Urinating at Night and Weak urinary stream. Musculoskeletal Not Present- Back Pain, Joint Pain, Joint Swelling, Morning Stiffness, Muscle Pain, Muscle Weakness and Spasms. Neurological Not Present- Blackout spells, Difficulty with balance, Dizziness, Paralysis, Tremor and Weakness. Psychiatric Not Present- Insomnia.  Vitals Weight: 230 lb Height: 66in Weight was reported by patient. Height was reported by patient. Body Surface Area: 2.12 m Body Mass Index: 37.12 kg/m  BP: 148/78 (Sitting, Left Arm, Standard)  Physical Exam General Mental Status -Alert, cooperative and good historian. General Appearance-pleasant, Not in acute distress. Orientation-Oriented X3. Build & Nutrition-Well nourished and Well developed.  Head and Neck Head-normocephalic, atraumatic . Neck Global Assessment - supple, no bruit auscultated on the right, no bruit auscultated on the left.  Eye Vision-Wears corrective lenses. Pupil - Bilateral-Regular and Round. Motion - Bilateral-EOMI.  Chest and Lung Exam Auscultation Breath sounds - clear at anterior chest wall and  clear at posterior chest wall. Adventitious sounds - No Adventitious sounds.  Cardiovascular Auscultation Rhythm - Regular rate and rhythm. Heart Sounds - S1 WNL and S2 WNL. Murmurs & Other Heart Sounds - Auscultation of the heart reveals - No Murmurs.  Abdomen Palpation/Percussion Tenderness - Abdomen is non-tender to palpation. Rigidity (guarding) - Abdomen is soft. Auscultation Auscultation of the abdomen reveals - Bowel sounds normal.  Female Genitourinary Note: Not done, not pertinent to present illness   Musculoskeletal Note: On exam, she is in no distress. Evaluation of her left hip shows hip motion - full extension, further flexion to 95 degrees with pain in the anterior groin, internal rotation of 10 degrees with pain in the anterior groin, external rotation of 25 degrees with pain in the anterior groin. Sensation and circulation are intact.  IMAGING X-rays show osteoarthritic change in the hip. No overt evidence of fracture though with her weightbearing pain. Pain when she rolls over; I am concerned about a stress fracture.  Assessment & Plan Primary osteoarthritis of left hip (M16.12)  Note:Surgical Plans: Left Total Hip Replacement - Anterior Approach  Disposition: Home  PCP: Dr. Marcello Moores  Topical TXA - History of Breast Cancer  Anesthesia Issues: None  Comments: AVOID THE RIGHT ARM FOR BP'S AND IV'S  Signed electronically by Joelene Millin, III PA-C

## 2015-05-09 ENCOUNTER — Encounter (HOSPITAL_COMMUNITY): Payer: Self-pay | Admitting: *Deleted

## 2015-05-09 ENCOUNTER — Inpatient Hospital Stay (HOSPITAL_COMMUNITY): Payer: Medicare Other | Admitting: Anesthesiology

## 2015-05-09 ENCOUNTER — Inpatient Hospital Stay (HOSPITAL_COMMUNITY): Payer: Medicare Other

## 2015-05-09 ENCOUNTER — Encounter (HOSPITAL_COMMUNITY)
Admission: RE | Disposition: A | Discharge status: Home Health | Payer: Self-pay | Source: Ambulatory Visit | Attending: Orthopedic Surgery

## 2015-05-09 ENCOUNTER — Inpatient Hospital Stay (HOSPITAL_COMMUNITY)
Admission: RE | Admit: 2015-05-09 | Discharge: 2015-05-11 | DRG: 470 | Disposition: A | Discharge status: Home Health | Payer: Medicare Other | Source: Ambulatory Visit | Attending: Orthopedic Surgery | Admitting: Orthopedic Surgery

## 2015-05-09 DIAGNOSIS — Z853 Personal history of malignant neoplasm of breast: Secondary | ICD-10-CM | POA: Diagnosis not present

## 2015-05-09 DIAGNOSIS — Z96649 Presence of unspecified artificial hip joint: Secondary | ICD-10-CM

## 2015-05-09 DIAGNOSIS — M810 Age-related osteoporosis without current pathological fracture: Secondary | ICD-10-CM | POA: Diagnosis present

## 2015-05-09 DIAGNOSIS — M1612 Unilateral primary osteoarthritis, left hip: Principal | ICD-10-CM | POA: Diagnosis present

## 2015-05-09 DIAGNOSIS — Z01812 Encounter for preprocedural laboratory examination: Secondary | ICD-10-CM

## 2015-05-09 DIAGNOSIS — Z96641 Presence of right artificial hip joint: Secondary | ICD-10-CM | POA: Diagnosis present

## 2015-05-09 DIAGNOSIS — Z87891 Personal history of nicotine dependence: Secondary | ICD-10-CM

## 2015-05-09 DIAGNOSIS — E785 Hyperlipidemia, unspecified: Secondary | ICD-10-CM | POA: Diagnosis present

## 2015-05-09 DIAGNOSIS — K219 Gastro-esophageal reflux disease without esophagitis: Secondary | ICD-10-CM | POA: Diagnosis present

## 2015-05-09 DIAGNOSIS — R32 Unspecified urinary incontinence: Secondary | ICD-10-CM | POA: Diagnosis present

## 2015-05-09 DIAGNOSIS — Z96651 Presence of right artificial knee joint: Secondary | ICD-10-CM | POA: Diagnosis present

## 2015-05-09 DIAGNOSIS — G8929 Other chronic pain: Secondary | ICD-10-CM | POA: Diagnosis present

## 2015-05-09 DIAGNOSIS — M25552 Pain in left hip: Secondary | ICD-10-CM | POA: Diagnosis present

## 2015-05-09 DIAGNOSIS — Z79899 Other long term (current) drug therapy: Secondary | ICD-10-CM | POA: Diagnosis not present

## 2015-05-09 DIAGNOSIS — M169 Osteoarthritis of hip, unspecified: Secondary | ICD-10-CM

## 2015-05-09 DIAGNOSIS — G4730 Sleep apnea, unspecified: Secondary | ICD-10-CM | POA: Diagnosis present

## 2015-05-09 HISTORY — PX: TOTAL HIP ARTHROPLASTY: SHX124

## 2015-05-09 HISTORY — DX: Osteoarthritis of hip, unspecified: M16.9

## 2015-05-09 SURGERY — ARTHROPLASTY, HIP, TOTAL, ANTERIOR APPROACH
Anesthesia: Spinal | Site: Hip | Laterality: Left

## 2015-05-09 MED ORDER — PROPOFOL 10 MG/ML IV BOLUS
INTRAVENOUS | Status: DC | PRN
  Administered 2015-05-09 (×2): 20 mg via INTRAVENOUS

## 2015-05-09 MED ORDER — PROPOFOL 500 MG/50ML IV EMUL
INTRAVENOUS | Status: DC | PRN
  Administered 2015-05-09: 100 ug/kg/min via INTRAVENOUS

## 2015-05-09 MED ORDER — FLEET ENEMA 7-19 GM/118ML RE ENEM: 1.0000 | ENEMA | Freq: Once | RECTAL | Status: DC | PRN

## 2015-05-09 MED ORDER — TRAMADOL HCL 50 MG PO TABS
50.0000 mg | ORAL_TABLET | Freq: Four times a day (QID) | ORAL | Status: DC | PRN
Start: 2015-05-09 — End: 2015-05-11

## 2015-05-09 MED ORDER — ACETAMINOPHEN 500 MG PO TABS
1000.0000 mg | ORAL_TABLET | Freq: Four times a day (QID) | ORAL | Status: AC
  Administered 2015-05-09 – 2015-05-10 (×4): 1000 mg via ORAL
  Filled 2015-05-09 (×4): qty 2

## 2015-05-09 MED ORDER — PROPOFOL 10 MG/ML IV BOLUS
INTRAVENOUS | Status: AC
  Filled 2015-05-09: qty 20

## 2015-05-09 MED ORDER — ATORVASTATIN CALCIUM 20 MG PO TABS
20.0000 mg | ORAL_TABLET | Freq: Every day | ORAL | Status: DC
  Administered 2015-05-10 – 2015-05-11 (×2): 20 mg via ORAL
  Filled 2015-05-09 (×2): qty 1

## 2015-05-09 MED ORDER — ONDANSETRON HCL 4 MG/2ML IJ SOLN: 4.0000 mg | Freq: Four times a day (QID) | INTRAMUSCULAR | Status: DC | PRN

## 2015-05-09 MED ORDER — MIDAZOLAM HCL 2 MG/2ML IJ SOLN
INTRAMUSCULAR | Status: AC
  Filled 2015-05-09: qty 2

## 2015-05-09 MED ORDER — BISACODYL 10 MG RE SUPP: 10.0000 mg | Freq: Every day | RECTAL | Status: DC | PRN

## 2015-05-09 MED ORDER — MEPERIDINE HCL 50 MG/ML IJ SOLN
6.2500 mg | INTRAMUSCULAR | Status: DC | PRN
Start: 2015-05-09 — End: 2015-05-09

## 2015-05-09 MED ORDER — DEXAMETHASONE SODIUM PHOSPHATE 10 MG/ML IJ SOLN
10.0000 mg | Freq: Once | INTRAMUSCULAR | Status: AC
  Administered 2015-05-09: 10 mg via INTRAVENOUS

## 2015-05-09 MED ORDER — STERILE WATER FOR IRRIGATION IR SOLN
Status: DC | PRN
  Administered 2015-05-09: 1000 mL

## 2015-05-09 MED ORDER — PROMETHAZINE HCL 25 MG/ML IJ SOLN: 6.2500 mg | INTRAMUSCULAR | Status: DC | PRN

## 2015-05-09 MED ORDER — KCL IN DEXTROSE-NACL 20-5-0.9 MEQ/L-%-% IV SOLN
INTRAVENOUS | Status: DC
  Administered 2015-05-09: 20:00:00 via INTRAVENOUS
  Filled 2015-05-09 (×4): qty 1000

## 2015-05-09 MED ORDER — METHOCARBAMOL 1000 MG/10ML IJ SOLN
500.0000 mg | Freq: Four times a day (QID) | INTRAVENOUS | Status: DC | PRN
  Administered 2015-05-09: 500 mg via INTRAVENOUS
  Filled 2015-05-09 (×2): qty 5

## 2015-05-09 MED ORDER — METHOCARBAMOL 500 MG PO TABS
500.0000 mg | ORAL_TABLET | Freq: Four times a day (QID) | ORAL | Status: DC | PRN
  Administered 2015-05-10 (×2): 500 mg via ORAL
  Filled 2015-05-09 (×2): qty 1

## 2015-05-09 MED ORDER — RIVAROXABAN 10 MG PO TABS
10.0000 mg | ORAL_TABLET | Freq: Every day | ORAL | Status: DC
  Administered 2015-05-10 – 2015-05-11 (×2): 10 mg via ORAL
  Filled 2015-05-09 (×4): qty 1

## 2015-05-09 MED ORDER — PHENOL 1.4 % MT LIQD: 1.0000 | OROMUCOSAL | Status: DC | PRN

## 2015-05-09 MED ORDER — BUPIVACAINE HCL (PF) 0.25 % IJ SOLN
INTRAMUSCULAR | Status: AC
  Filled 2015-05-09: qty 30

## 2015-05-09 MED ORDER — DEXAMETHASONE SODIUM PHOSPHATE 10 MG/ML IJ SOLN
10.0000 mg | Freq: Once | INTRAMUSCULAR | Status: AC
  Administered 2015-05-10: 10 mg via INTRAVENOUS
  Filled 2015-05-09: qty 1

## 2015-05-09 MED ORDER — METOCLOPRAMIDE HCL 5 MG/ML IJ SOLN: 5.0000 mg | Freq: Three times a day (TID) | INTRAMUSCULAR | Status: DC | PRN

## 2015-05-09 MED ORDER — TRANEXAMIC ACID 1000 MG/10ML IV SOLN
2000.0000 mg | INTRAVENOUS | Status: DC | PRN
  Administered 2015-05-09: 2000 mg via TOPICAL

## 2015-05-09 MED ORDER — CEFAZOLIN SODIUM-DEXTROSE 2-3 GM-% IV SOLR
2.0000 g | Freq: Four times a day (QID) | INTRAVENOUS | Status: AC
  Administered 2015-05-09 – 2015-05-10 (×2): 2 g via INTRAVENOUS
  Filled 2015-05-09 (×3): qty 50

## 2015-05-09 MED ORDER — METOCLOPRAMIDE HCL 10 MG PO TABS: 5.0000 mg | ORAL_TABLET | Freq: Three times a day (TID) | ORAL | Status: DC | PRN

## 2015-05-09 MED ORDER — PROPOFOL 10 MG/ML IV BOLUS
INTRAVENOUS | Status: AC
  Filled 2015-05-09: qty 40

## 2015-05-09 MED ORDER — HYDROMORPHONE HCL 1 MG/ML IJ SOLN
INTRAMUSCULAR | Status: AC
Start: 2015-05-09 — End: 2015-05-10
  Filled 2015-05-09: qty 1

## 2015-05-09 MED ORDER — ONDANSETRON HCL 4 MG/2ML IJ SOLN
INTRAMUSCULAR | Status: DC | PRN
  Administered 2015-05-09: 4 mg via INTRAVENOUS

## 2015-05-09 MED ORDER — ONDANSETRON HCL 4 MG/2ML IJ SOLN
INTRAMUSCULAR | Status: AC
  Filled 2015-05-09: qty 2

## 2015-05-09 MED ORDER — ACETAMINOPHEN 325 MG PO TABS: 650.0000 mg | ORAL_TABLET | Freq: Four times a day (QID) | ORAL | Status: DC | PRN

## 2015-05-09 MED ORDER — 0.9 % SODIUM CHLORIDE (POUR BTL) OPTIME
TOPICAL | Status: DC | PRN
  Administered 2015-05-09: 1000 mL

## 2015-05-09 MED ORDER — HYDROMORPHONE HCL 1 MG/ML IJ SOLN
0.2500 mg | INTRAMUSCULAR | Status: DC | PRN
  Administered 2015-05-09 (×4): 0.5 mg via INTRAVENOUS

## 2015-05-09 MED ORDER — DIPHENHYDRAMINE HCL 12.5 MG/5ML PO ELIX: 12.5000 mg | ORAL_SOLUTION | ORAL | Status: DC | PRN

## 2015-05-09 MED ORDER — LACTATED RINGERS IV SOLN
INTRAVENOUS | Status: DC
  Administered 2015-05-09: 17:00:00 via INTRAVENOUS
  Administered 2015-05-09: 1000 mL via INTRAVENOUS

## 2015-05-09 MED ORDER — FENTANYL CITRATE (PF) 100 MCG/2ML IJ SOLN
INTRAMUSCULAR | Status: DC | PRN
  Administered 2015-05-09 (×2): 50 ug via INTRAVENOUS

## 2015-05-09 MED ORDER — FENTANYL CITRATE (PF) 100 MCG/2ML IJ SOLN
INTRAMUSCULAR | Status: AC
  Filled 2015-05-09: qty 2

## 2015-05-09 MED ORDER — PHENYLEPHRINE HCL 10 MG/ML IJ SOLN
INTRAMUSCULAR | Status: AC
  Filled 2015-05-09: qty 1

## 2015-05-09 MED ORDER — CEFAZOLIN SODIUM-DEXTROSE 2-3 GM-% IV SOLR
INTRAVENOUS | Status: AC
  Filled 2015-05-09: qty 50

## 2015-05-09 MED ORDER — MORPHINE SULFATE (PF) 2 MG/ML IV SOLN: 1.0000 mg | INTRAVENOUS | Status: DC | PRN

## 2015-05-09 MED ORDER — TRANEXAMIC ACID 1000 MG/10ML IV SOLN
2000.0000 mg | Freq: Once | INTRAVENOUS | Status: DC
  Filled 2015-05-09: qty 20

## 2015-05-09 MED ORDER — CHLORHEXIDINE GLUCONATE 4 % EX LIQD: 60.0000 mL | Freq: Once | CUTANEOUS | Status: DC

## 2015-05-09 MED ORDER — SODIUM CHLORIDE 0.9 % IV SOLN
10.0000 mg | INTRAVENOUS | Status: DC | PRN
  Administered 2015-05-09: 50 ug/min via INTRAVENOUS

## 2015-05-09 MED ORDER — OXYCODONE HCL 5 MG PO TABS
5.0000 mg | ORAL_TABLET | ORAL | Status: DC | PRN
  Administered 2015-05-09 – 2015-05-11 (×8): 10 mg via ORAL
  Filled 2015-05-09 (×8): qty 2

## 2015-05-09 MED ORDER — BUPIVACAINE HCL (PF) 0.25 % IJ SOLN
INTRAMUSCULAR | Status: DC | PRN
Start: 2015-05-09 — End: 2015-05-09
  Administered 2015-05-09: 20 mL

## 2015-05-09 MED ORDER — ONDANSETRON HCL 4 MG PO TABS: 4.0000 mg | ORAL_TABLET | Freq: Four times a day (QID) | ORAL | Status: DC | PRN

## 2015-05-09 MED ORDER — FENTANYL CITRATE (PF) 100 MCG/2ML IJ SOLN: 25.0000 ug | INTRAMUSCULAR | Status: DC | PRN

## 2015-05-09 MED ORDER — BUPIVACAINE IN DEXTROSE 0.75-8.25 % IT SOLN
INTRATHECAL | Status: DC | PRN
  Administered 2015-05-09: 2 mL via INTRATHECAL

## 2015-05-09 MED ORDER — ACETAMINOPHEN 10 MG/ML IV SOLN
INTRAVENOUS | Status: AC
  Filled 2015-05-09: qty 100

## 2015-05-09 MED ORDER — ACETAMINOPHEN 10 MG/ML IV SOLN
1000.0000 mg | Freq: Once | INTRAVENOUS | Status: AC
  Administered 2015-05-09: 1000 mg via INTRAVENOUS
  Filled 2015-05-09: qty 100

## 2015-05-09 MED ORDER — SODIUM CHLORIDE 0.9 % IV SOLN
INTRAVENOUS | Status: DC
Start: 2015-05-09 — End: 2015-05-09

## 2015-05-09 MED ORDER — MIDAZOLAM HCL 5 MG/5ML IJ SOLN
INTRAMUSCULAR | Status: DC | PRN
  Administered 2015-05-09: 2 mg via INTRAVENOUS

## 2015-05-09 MED ORDER — ACETAMINOPHEN 650 MG RE SUPP: 650.0000 mg | Freq: Four times a day (QID) | RECTAL | Status: DC | PRN

## 2015-05-09 MED ORDER — CEFAZOLIN SODIUM-DEXTROSE 2-3 GM-% IV SOLR
2.0000 g | INTRAVENOUS | Status: AC
Start: 2015-05-10 — End: 2015-05-09
  Administered 2015-05-09: 2 g via INTRAVENOUS

## 2015-05-09 MED ORDER — MENTHOL 3 MG MT LOZG: 1.0000 | LOZENGE | OROMUCOSAL | Status: DC | PRN

## 2015-05-09 MED ORDER — EPHEDRINE SULFATE 50 MG/ML IJ SOLN
INTRAMUSCULAR | Status: DC | PRN
  Administered 2015-05-09 (×2): 5 mg via INTRAVENOUS

## 2015-05-09 MED ORDER — DOCUSATE SODIUM 100 MG PO CAPS
100.0000 mg | ORAL_CAPSULE | Freq: Two times a day (BID) | ORAL | Status: DC
  Administered 2015-05-09 – 2015-05-11 (×4): 100 mg via ORAL

## 2015-05-09 MED ORDER — POLYETHYLENE GLYCOL 3350 17 G PO PACK: 17.0000 g | PACK | Freq: Every day | ORAL | Status: DC | PRN

## 2015-05-09 SURGICAL SUPPLY — 34 items
BAG DECANTER FOR FLEXI CONT (MISCELLANEOUS) ×3 IMPLANT
BAG ZIPLOCK 12X15 (MISCELLANEOUS) IMPLANT
BLADE SAG 18X100X1.27 (BLADE) ×3 IMPLANT
CAPT HIP TOTAL 2 ×3 IMPLANT
CLOSURE WOUND 1/2 X4 (GAUZE/BANDAGES/DRESSINGS) ×1
CLOTH BEACON ORANGE TIMEOUT ST (SAFETY) ×3 IMPLANT
COVER PERINEAL POST (MISCELLANEOUS) ×3 IMPLANT
DECANTER SPIKE VIAL GLASS SM (MISCELLANEOUS) ×3 IMPLANT
DRAPE STERI IOBAN 125X83 (DRAPES) ×3 IMPLANT
DRAPE U-SHAPE 47X51 STRL (DRAPES) ×6 IMPLANT
DRSG ADAPTIC 3X8 NADH LF (GAUZE/BANDAGES/DRESSINGS) ×3 IMPLANT
DRSG MEPILEX BORDER 4X4 (GAUZE/BANDAGES/DRESSINGS) ×3 IMPLANT
DRSG MEPILEX BORDER 4X8 (GAUZE/BANDAGES/DRESSINGS) ×3 IMPLANT
DURAPREP 26ML APPLICATOR (WOUND CARE) ×3 IMPLANT
ELECT REM PT RETURN 9FT ADLT (ELECTROSURGICAL) ×3
ELECTRODE REM PT RTRN 9FT ADLT (ELECTROSURGICAL) ×1 IMPLANT
EVACUATOR 1/8 PVC DRAIN (DRAIN) ×3 IMPLANT
GLOVE BIO SURGEON STRL SZ7.5 (GLOVE) ×3 IMPLANT
GLOVE BIO SURGEON STRL SZ8 (GLOVE) ×6 IMPLANT
GLOVE BIOGEL PI IND STRL 8 (GLOVE) ×2 IMPLANT
GLOVE BIOGEL PI INDICATOR 8 (GLOVE) ×4
GOWN STRL REUS W/TWL LRG LVL3 (GOWN DISPOSABLE) ×3 IMPLANT
GOWN STRL REUS W/TWL XL LVL3 (GOWN DISPOSABLE) ×3 IMPLANT
PACK ANTERIOR HIP CUSTOM (KITS) ×3 IMPLANT
STRIP CLOSURE SKIN 1/2X4 (GAUZE/BANDAGES/DRESSINGS) ×2 IMPLANT
SUT ETHIBOND NAB CT1 #1 30IN (SUTURE) ×3 IMPLANT
SUT MNCRL AB 4-0 PS2 18 (SUTURE) ×3 IMPLANT
SUT VIC AB 2-0 CT1 27 (SUTURE) ×4
SUT VIC AB 2-0 CT1 TAPERPNT 27 (SUTURE) ×2 IMPLANT
SUT VLOC 180 0 24IN GS25 (SUTURE) ×3 IMPLANT
SYR 50ML LL SCALE MARK (SYRINGE) ×3 IMPLANT
TRAY FOLEY W/METER SILVER 14FR (SET/KITS/TRAYS/PACK) ×3 IMPLANT
TRAY FOLEY W/METER SILVER 16FR (SET/KITS/TRAYS/PACK) IMPLANT
YANKAUER SUCT BULB TIP 10FT TU (MISCELLANEOUS) ×3 IMPLANT

## 2015-05-09 NOTE — Anesthesia Procedure Notes (Signed)
Spinal  Patient location during procedure: OR Staffing Anesthesiologist: Klay Sobotka Performed by: anesthesiologist  Preanesthetic Checklist Completed: patient identified, site marked, surgical consent, pre-op evaluation, timeout performed, IV checked, risks and benefits discussed and monitors and equipment checked Spinal Block Patient position: sitting Prep: Betadine Patient monitoring: heart rate, continuous pulse ox and blood pressure Approach: right paramedian Location: L2-3 Injection technique: single-shot Needle Needle type: Spinocan  Needle gauge: 22 G Needle length: 9 cm Additional Notes Expiration date of kit checked and confirmed. Patient tolerated procedure well, without complications.     

## 2015-05-09 NOTE — Transfer of Care (Signed)
Immediate Anesthesia Transfer of Care Note  Patient: Crystal Lee  Procedure(s) Performed: Procedure(s): LEFT TOTAL HIP ARTHROPLASTY ANTERIOR APPROACH (Left)  Patient Location: PACU  Anesthesia Type:Spinal  Level of Consciousness: awake and alert   Airway & Oxygen Therapy: Patient Spontanous Breathing and Patient connected to face mask oxygen  Post-op Assessment: Report given to RN and Post -op Vital signs reviewed and stable  Post vital signs: Reviewed and stable  Last Vitals:  Filed Vitals:   05/09/15 1344  BP: 142/67  Pulse: 73  Temp: 36.4 C  Resp: 18    Complications: No apparent anesthesia complications

## 2015-05-09 NOTE — Anesthesia Preprocedure Evaluation (Signed)
Anesthesia Evaluation  Patient identified by MRN, date of birth, ID band Patient awake    Reviewed: Allergy & Precautions, NPO status , Patient's Chart, lab work & pertinent test results  History of Anesthesia Complications (+) PONV  Airway Mallampati: II  TM Distance: >3 FB Neck ROM: Full    Dental no notable dental hx.    Pulmonary sleep apnea , former smoker,    Pulmonary exam normal breath sounds clear to auscultation       Cardiovascular negative cardio ROS Normal cardiovascular exam Rhythm:Regular Rate:Normal     Neuro/Psych negative neurological ROS  negative psych ROS   GI/Hepatic negative GI ROS, Neg liver ROS,   Endo/Other  negative endocrine ROS  Renal/GU negative Renal ROS  negative genitourinary   Musculoskeletal negative musculoskeletal ROS (+)   Abdominal   Peds negative pediatric ROS (+)  Hematology negative hematology ROS (+)   Anesthesia Other Findings   Reproductive/Obstetrics negative OB ROS                             Anesthesia Physical Anesthesia Plan  ASA: II  Anesthesia Plan: Spinal   Post-op Pain Management:    Induction:   Airway Management Planned: Simple Face Mask  Additional Equipment:   Intra-op Plan:   Post-operative Plan:   Informed Consent: I have reviewed the patients History and Physical, chart, labs and discussed the procedure including the risks, benefits and alternatives for the proposed anesthesia with the patient or authorized representative who has indicated his/her understanding and acceptance.   Dental advisory given  Plan Discussed with: CRNA  Anesthesia Plan Comments:         Anesthesia Quick Evaluation

## 2015-05-09 NOTE — Op Note (Signed)
OPERATIVE REPORT  PREOPERATIVE DIAGNOSIS: Osteoarthritis of the Left hip.   POSTOPERATIVE DIAGNOSIS: Osteoarthritis of the Left  hip.   PROCEDURE: Left total hip arthroplasty, anterior approach.   SURGEON: Gaynelle Arabian, MD   ASSISTANT: Arlee Muslim, PA-C  ANESTHESIA:  Spinal  ESTIMATED BLOOD LOSS:-550 ml    DRAINS: Hemovac x1.   COMPLICATIONS: None   CONDITION: PACU - hemodynamically stable.   BRIEF CLINICAL NOTE: Crystal Lee is a 81 y.o. female who has advanced end-  stage arthritis of her Left  hip with progressively worsening pain and  dysfunction.The patient has failed nonoperative management and presents for  total hip arthroplasty.   PROCEDURE IN DETAIL: After successful administration of spinal  anesthetic, the traction boots for the [facility] bed were placed on both  feet and the patient was placed onto the [facility] bed, boots placed into the leg  holders. The Left hip was then isolated from the perineum with plastic  drapes and prepped and draped in the usual sterile fashion. ASIS and  greater trochanter were marked and a oblique incision was made, starting  at about 1 cm lateral and 2 cm distal to the ASIS and coursing towards  the anterior cortex of the femur. The skin was cut with a 10 blade  through subcutaneous tissue to the level of the fascia overlying the  tensor fascia lata muscle. The fascia was then incised in line with the  incision at the junction of the anterior third and posterior 2/3rd. The  muscle was teased off the fascia and then the interval between the TFL  and the rectus was developed. The Hohmann retractor was then placed at  the top of the femoral neck over the capsule. The vessels overlying the  capsule were cauterized and the fat on top of the capsule was removed.  A Hohmann retractor was then placed anterior underneath the rectus  femoris to give exposure to the entire anterior capsule. A T-shaped  capsulotomy was performed. The  edges were tagged and the femoral head  was identified.       Osteophytes are removed off the superior acetabulum.  The femoral neck was then cut in situ with an oscillating saw. Traction  was then applied to the left lower extremity utilizing the [facility]  traction. The femoral head was then removed. Retractors were placed  around the acetabulum and then circumferential removal of the labrum was  performed. Osteophytes were also removed. Reaming starts at 47 mm to  medialize and  Increased in 2 mm increments to 53 mm. We reamed in  approximately 40 degrees of abduction, 20 degrees anteversion. A 54 mm  pinnacle acetabular shell was then impacted in anatomic position under  fluoroscopic guidance with excellent purchase. We did not need to place  any additional dome screws. A 36 mm neutral + 4 marathon liner was then  placed into the acetabular shell.       The femoral lift was then placed along the lateral aspect of the femur  just distal to the vastus ridge. The leg was  externally rotated and capsule  was stripped off the inferior aspect of the femoral neck down to the  level of the lesser trochanter, this was done with electrocautery. The femur was lifted after this was performed. The  leg was then placed and extended in adducted position to essentially delivering the femur. We also removed the capsule superiorly and the  piriformis from the piriformis  fossa to gain excellent exposure of the  proximal femur. Rongeur was used to remove some cancellous bone to get  into the lateral portion of the proximal femur for placement of the  initial starter reamer. The starter broaches was placed  the starter broach  and was shown to go down the center of the canal. Broaching  with the  Corail system was then performed starting at size 8, coursing  Up to size 15. A size 15 had excellent torsional and rotational  and axial stability. The trial high offset neck was then placed  with a 36 + 5 trial head.  The hip was then reduced. We confirmed that  the stem was in the canal both on AP and lateral x-rays. It also has excellent sizing. The hip was reduced with outstanding stability through full extension, full external rotation,  and then flexion in adduction internal rotation. AP pelvis was taken  and the leg lengths were measured and found to be exactly equal. Hip  was then dislocated again and the femoral head and neck removed. The  femoral broach was removed. Size 15 Corail stem with a high offset  neck was then impacted into the femur following native anteversion. Has  excellent purchase in the canal. Excellent torsional and rotational and  axial stability. It is confirmed to be in the canal on AP and lateral  fluoroscopic views. The 36 + 5 ceramic head was placed and the hip  reduced with outstanding stability. Again AP pelvis was taken and it  confirmed that the leg lengths were equal. The wound was then copiously  irrigated with saline solution and the capsule reattached and repaired  with Ethibond suture. 30 ml of .25% Bupivicaine injected into the capsule and into the edge of the tensor fascia lata as well as subcutaneous tissue. The fascia overlying the tensor fascia lata was  then closed with a running #1 V-Loc. Subcu was closed with interrupted  2-0 Vicryl and subcuticular running 4-0 Monocryl. Incision was cleaned  and dried. Steri-Strips and a bulky sterile dressing applied. Hemovac  drain was hooked to suction and then he was awakened and transported to  recovery in stable condition.        Please note that a surgical assistant was a medical necessity for this procedure to perform it in a safe and expeditious manner. Assistant was necessary to provide appropriate retraction of vital neurovascular structures and to prevent femoral fracture and allow for anatomic placement of the prosthesis.  Gaynelle Arabian, M.D.

## 2015-05-09 NOTE — Interval H&P Note (Signed)
History and Physical Interval Note:  05/09/2015 3:24 PM  Crystal Lee  has presented today for surgery, with the diagnosis of OA LEFT KNEE   The various methods of treatment have been discussed with the patient and family. After consideration of risks, benefits and other options for treatment, the patient has consented to  Procedure(s): LEFT TOTAL HIP ARTHROPLASTY ANTERIOR APPROACH (Left) as a surgical intervention .  The patient's history has been reviewed, patient examined, no change in status, stable for surgery.  I have reviewed the patient's chart and labs.  Questions were answered to the patient's satisfaction.     Gearlean Alf

## 2015-05-09 NOTE — Anesthesia Postprocedure Evaluation (Signed)
Anesthesia Post Note  Patient: Crystal Lee  Procedure(s) Performed: Procedure(s) (LRB): LEFT TOTAL HIP ARTHROPLASTY ANTERIOR APPROACH (Left)  Patient location during evaluation: PACU Anesthesia Type: Spinal Level of consciousness: oriented and awake and alert Pain management: pain level controlled Vital Signs Assessment: post-procedure vital signs reviewed and stable Respiratory status: spontaneous breathing, respiratory function stable and patient connected to nasal cannula oxygen Cardiovascular status: blood pressure returned to baseline and stable Postop Assessment: no headache and no backache Anesthetic complications: no    Last Vitals:  Filed Vitals:   05/09/15 1827 05/09/15 2008  BP: 123/40 121/61  Pulse: 69 69  Temp: 36.7 C 36.4 C  Resp: 15 16    Last Pain:  Filed Vitals:   05/09/15 2009  PainSc: 2                  Montez Hageman

## 2015-05-09 NOTE — H&P (View-Only) (Signed)
Crystal Lee DOB: Jun 05, 1933 Married / Language: English / Race: White Female Date of Admission:  05/09/2015 CC:  Left Hip Pain History of Present Illness The patient is a 81 year old female who comes in for a preoperative History and Physical. The patient is scheduled for a left total hip arthroplasty (anterior) to be performed by Dr. Dione Plover. Aluisio, MD at [facility] on 05-09-2015. The patient is a 81 year old female who presented for follow up of their hip. The patient is being followed for their left hip pain and osteoarthritis. She was on vacation a while back and fell down on the toliet due to it sitting low. She has lateral hip and groin pain. Unable to sleep on lateral side. Symptoms reported include: pain (severe). The patient feels that they are doing poorly and report their pain level to be severe. Current treatment includes: pain medications. The following medication has been used for pain control: antiinflammatory medication and Hydrocodone. Due to continued pain, she was sent for an MRI.  She was found to have a real significant stress reaction in femoral head and femoral neck. She continued with significant pain despite conservative measures. She has got a bad problem going on with that big stress reaction and with the degenerative changes. She was heading toward a hip replacement. She wanted to go ahead and get the hip replaced. She is subsequently admitted for surgery. They have been treated conservatively in the past for the above stated problem and despite conservative measures, they continue to have progressive pain and severe functional limitations and dysfunction. They have failed non-operative management including home exercise, medications. It is felt that they would benefit from undergoing total joint replacement. Risks and benefits of the procedure have been discussed with the patient and they elect to proceed with surgery. There are no active contraindications to  surgery such as ongoing infection or rapidly progressive neurological disease.  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 father  Social History Living situation  live with spouse Illicit drug use  no Tobacco / smoke exposure  no Marital status  married Exercise  Exercises rarely; does running / walking Current work status  retired Children  3 Drug/Alcohol Rehab (Previously)  no Drug/Alcohol Rehab (Currently)  no Tobacco use  Former smoker. former smoker; smoke(d) less than 1/2 pack(s) per day Alcohol use  never consumed alcohol  Medication History Atorvastatin Calcium (20MG  Tablet, Oral) Active. Cyanocobalamin (1000MCG Tablet, Oral) Active. Fosamax Plus D (70-5600MG -UNIT Tablet, Oral) Active. Ointment for Rosacea Active. (unsure of name) Vitamin D (50000UNIT Capsule, Oral) Active. Hydrocodone-Acetaminophen (5-325MG  Tablet, Oral) Active. (only on occasion)  Past Surgical History Total Knee Replacement  right Total Hip Replacement  right Hysterectomy  partial (cancerous) Gallbladder Surgery  open Spinal Surgery  Mastectomy  left and right Cataract Surgery  left and right Arthroscopy of Knee  bilateral Colon Polyp Removal - Open  Colon Polyp Removal - Colonoscopy  Appendectomy  Review of Systems) General Not Present- Chills,  Fatigue, Fever, Memory Loss, Night Sweats, Weight Gain and Weight Loss. Skin Not Present- Eczema, Hives, Itching, Lesions and Rash. HEENT Not Present- Dentures, Double Vision, Headache, Hearing Loss, Tinnitus and Visual Loss. Respiratory Not Present- Allergies, Chronic Cough, Coughing up blood, Shortness of breath at rest and Shortness of breath with exertion. Cardiovascular Not Present- Chest Pain, Difficulty Breathing Lying Down, Murmur, Palpitations, Racing/skipping heartbeats and Swelling. Gastrointestinal Not Present- Abdominal Pain, Bloody Stool, Constipation, Diarrhea, Difficulty Swallowing, Heartburn, Jaundice, Loss of appetitie, Nausea and Vomiting. Female Genitourinary Present- Incontinence and Urinary frequency. Not Present- Blood in Urine, Discharge, Flank Pain, Painful Urination, Urgency, Urinary Retention, Urinating at Night and Weak urinary stream. Musculoskeletal Not Present- Back Pain, Joint Pain, Joint Swelling, Morning Stiffness, Muscle Pain, Muscle Weakness and Spasms. Neurological Not Present- Blackout spells, Difficulty with balance, Dizziness, Paralysis, Tremor and Weakness. Psychiatric Not Present- Insomnia.  Vitals Weight: 230 lb Height: 66in Weight was reported by patient. Height was reported by patient. Body Surface Area: 2.12 m Body Mass Index: 37.12 kg/m  BP: 148/78 (Sitting, Left Arm, Standard)  Physical Exam General Mental Status -Alert, cooperative and good historian. General Appearance-pleasant, Not in acute distress. Orientation-Oriented X3. Build & Nutrition-Well nourished and Well developed.  Head and Neck Head-normocephalic, atraumatic . Neck Global Assessment - supple, no bruit auscultated on the right, no bruit auscultated on the left.  Eye Vision-Wears corrective lenses. Pupil - Bilateral-Regular and Round. Motion - Bilateral-EOMI.  Chest and Lung Exam Auscultation Breath sounds - clear at anterior chest wall and  clear at posterior chest wall. Adventitious sounds - No Adventitious sounds.  Cardiovascular Auscultation Rhythm - Regular rate and rhythm. Heart Sounds - S1 WNL and S2 WNL. Murmurs & Other Heart Sounds - Auscultation of the heart reveals - No Murmurs.  Abdomen Palpation/Percussion Tenderness - Abdomen is non-tender to palpation. Rigidity (guarding) - Abdomen is soft. Auscultation Auscultation of the abdomen reveals - Bowel sounds normal.  Female Genitourinary Note: Not done, not pertinent to present illness   Musculoskeletal Note: On exam, she is in no distress. Evaluation of her left hip shows hip motion - full extension, further flexion to 95 degrees with pain in the anterior groin, internal rotation of 10 degrees with pain in the anterior groin, external rotation of 25 degrees with pain in the anterior groin. Sensation and circulation are intact.  IMAGING X-rays show osteoarthritic change in the hip. No overt evidence of fracture though with her weightbearing pain. Pain when she rolls over; I am concerned about a stress fracture.  Assessment & Plan Primary osteoarthritis of left hip (M16.12)  Note:Surgical Plans: Left Total Hip Replacement - Anterior Approach  Disposition: Home  PCP: Dr. Marcello Moores  Topical TXA - History of Breast Cancer  Anesthesia Issues: None  Comments: AVOID THE RIGHT ARM FOR BP'S AND IV'S  Signed electronically by Joelene Millin, III PA-C

## 2015-05-10 LAB — BASIC METABOLIC PANEL
Anion gap: 7 (ref 5–15)
BUN: 12 mg/dL (ref 6–20)
CALCIUM: 8.5 mg/dL — AB (ref 8.9–10.3)
CO2: 24 mmol/L (ref 22–32)
CREATININE: 0.64 mg/dL (ref 0.44–1.00)
Chloride: 105 mmol/L (ref 101–111)
GFR calc non Af Amer: >60 mL/min (ref >60)
GLUCOSE: 156 mg/dL — AB (ref 65–99)
Potassium: 4.5 mmol/L (ref 3.5–5.1)
Sodium: 136 mmol/L (ref 135–145)

## 2015-05-10 LAB — CBC
HEMATOCRIT: 33.3 % — AB (ref 36.0–46.0)
Hemoglobin: 10.9 g/dL — ABNORMAL LOW (ref 12.0–15.0)
MCH: 28.5 pg (ref 26.0–34.0)
MCHC: 32.7 g/dL (ref 30.0–36.0)
MCV: 87.2 fL (ref 78.0–100.0)
Platelets: 134 10*3/uL — ABNORMAL LOW (ref 150–400)
RBC: 3.82 MIL/uL — ABNORMAL LOW (ref 3.87–5.11)
RDW: 13 % (ref 11.5–15.5)
WBC: 10.6 10*3/uL — ABNORMAL HIGH (ref 4.0–10.5)

## 2015-05-10 MED ORDER — RIVAROXABAN 10 MG PO TABS: 10.0000 mg | ORAL_TABLET | Freq: Every day | ORAL | Status: DC

## 2015-05-10 MED ORDER — METHOCARBAMOL 500 MG PO TABS: 500.0000 mg | ORAL_TABLET | Freq: Four times a day (QID) | ORAL | Status: DC | PRN

## 2015-05-10 MED ORDER — OXYCODONE HCL 5 MG PO TABS: 5.0000 mg | ORAL_TABLET | ORAL | Status: DC | PRN

## 2015-05-10 MED ORDER — TRAMADOL HCL 50 MG PO TABS: 50.0000 mg | ORAL_TABLET | Freq: Four times a day (QID) | ORAL | Status: DC | PRN

## 2015-05-10 NOTE — Progress Notes (Signed)
Physical Therapy Treatment Patient Details Name: Crystal Lee MRN: [id number] DOB: [birth year] Today's Date: 05-23-15    History of Present Illness 81 yo female s/p left total hip arthroplasty, anterior approach    PT Comments    Pt very motivated and making steady progress with mobility.    Follow Up Recommendations  Home health PT     Equipment Recommendations  None recommended by PT    Recommendations for Other Services OT consult     Precautions / Restrictions Precautions Precautions: Fall Restrictions Weight Bearing Restrictions: No LLE Weight Bearing: Weight bearing as tolerated    Mobility  Bed Mobility Overal bed mobility: Needs Assistance Bed Mobility: Sit to Supine       Sit to supine: Min assist;Mod assist   General bed mobility comments: cues for sequence and use of R LE to self assist  Transfers Overall transfer level: Needs assistance Equipment used: Rolling walker (2 wheeled) Transfers: Sit to/from Stand Sit to Stand: Min guard         General transfer comment: cues for safety, hand placement, and LLE management  Ambulation/Gait Ambulation/Gait assistance: Min assist;Min guard Ambulation Distance (Feet): 200 Feet Assistive device: Rolling walker (2 wheeled) Gait Pattern/deviations: Step-to pattern;Step-through pattern;Decreased step length - right;Decreased step length - left;Shuffle;Trunk flexed Gait velocity: decr Gait velocity interpretation: Below normal speed for age/gender General Gait Details: cues for sequence, posture and position from [employer]            Wheelchair Mobility    Modified Rankin (Stroke Patients Only)       Balance                                    Cognition Arousal/Alertness: Awake/alert Behavior During Therapy: WFL for tasks assessed/performed Overall Cognitive Status: Within Functional Limits for tasks assessed                      Exercises      General  Comments        Pertinent Vitals/Pain Pain Assessment: 0-10 Pain Score: 7  Pain Location: L hip Pain Descriptors / Indicators: Aching;Sore Pain Intervention(s): Limited activity within patient's tolerance;Monitored during session;Premedicated before session;Ice applied    Home Living                      Prior Function            PT Goals (current goals can now be found in the care plan section) Acute Rehab PT Goals Patient Stated Goal: be able to walk to bathroom on my own PT Goal Formulation: With patient Time For Goal Achievement: 05/14/15 Potential to Achieve Goals: Good Progress towards PT goals: Progressing toward goals    Frequency  7X/week    PT Plan Current plan remains appropriate    Co-evaluation             End of Session Equipment Utilized During Treatment: Gait belt Activity Tolerance: Patient tolerated treatment well Patient left: in bed;with call bell/phone within reach;with family/visitor present     Time: 1446-1510 PT Time Calculation (min) (ACUTE ONLY): 24 min  Charges:  $Gait Training: 23-37 mins                    G Codes:      Ozzy Bohlken 2015/05/23, 5:10 PM

## 2015-05-10 NOTE — Discharge Instructions (Signed)
° °Dr. Frank Aluisio °Total Joint Specialist °[facility] °[address] °[place], [place] [address] °[phone] ° °ANTERIOR APPROACH TOTAL HIP REPLACEMENT POSTOPERATIVE DIRECTIONS ° ° °Hip Rehabilitation, Guidelines Following Surgery  °The results of a hip operation are greatly improved after range of motion and muscle strengthening exercises. Follow all safety measures which are given to protect your hip. If any of these exercises cause increased pain or swelling in your joint, decrease the amount until you are comfortable again. Then slowly increase the exercises. Call your caregiver if you have problems or questions.  ° °HOME CARE INSTRUCTIONS  °Remove items at home which could result in a fall. This includes throw rugs or furniture in walking pathways.  °· ICE to the affected hip every three hours for 30 minutes at a time and then as needed for pain and swelling.  Continue to use ice on the hip for pain and swelling from surgery. You may notice swelling that will progress down to the foot and ankle.  This is normal after surgery.  Elevate the leg when you are not up walking on it.   °· Continue to use the breathing machine which will help keep your temperature down.  It is common for your temperature to cycle up and down following surgery, especially at night when you are not up moving around and exerting yourself.  The breathing machine keeps your lungs expanded and your temperature down. ° ° °DIET °You may resume your previous home diet once your are discharged from the hospital. ° °DRESSING / WOUND CARE / SHOWERING °You may start showering once you are discharged home but do not submerge the incision under water. Just pat the incision dry and apply a dry gauze dressing on daily. °Change the surgical dressing daily and reapply a dry dressing each time. ° °ACTIVITY °Walk with your walker as instructed. °Use walker as long as suggested by your caregivers. °Avoid periods of inactivity  such as sitting longer than an hour when not asleep. This helps prevent blood clots.  °You may resume a sexual relationship in one month or when given the OK by your doctor.  °You may return to work once you are cleared by your doctor.  °Do not drive a car for 6 weeks or until released by you surgeon.  °Do not drive while taking narcotics. ° °WEIGHT BEARING °Weight bearing as tolerated with assist device (walker, cane, etc) as directed, use it as long as suggested by your surgeon or therapist, typically at least 4-6 weeks. ° °POSTOPERATIVE CONSTIPATION PROTOCOL °Constipation - defined medically as fewer than three stools per week and severe constipation as less than one stool per week. ° °One of the most common issues patients have following surgery is constipation.  Even if you have a regular bowel pattern at home, your normal regimen is likely to be disrupted due to multiple reasons following surgery.  Combination of anesthesia, postoperative narcotics, change in appetite and fluid intake all can affect your bowels.  In order to avoid complications following surgery, here are some recommendations in order to help you during your recovery period. ° °Colace (docusate) - Pick up an over-the-counter form of Colace or another stool softener and take twice a day as long as you are requiring postoperative pain medications.  Take with a full glass of water daily.  If you experience loose stools or diarrhea, hold the colace until you stool forms back up.  If your symptoms do not get better within 1   week or if they get worse, check with your doctor. ° °Dulcolax (bisacodyl) - Pick up over-the-counter and take as directed by the product packaging as needed to assist with the movement of your bowels.  Take with a full glass of water.  Use this product as needed if not relieved by Colace only.  ° °MiraLax (polyethylene glycol) - Pick up over-the-counter to have on hand.  MiraLax is a solution that will increase the amount of  water in your bowels to assist with bowel movements.  Take as directed and can mix with a glass of water, juice, soda, coffee, or tea.  Take if you go more than two days without a movement. °Do not use MiraLax more than once per day. Call your doctor if you are still constipated or irregular after using this medication for 7 days in a row. ° °If you continue to have problems with postoperative constipation, please contact the office for further assistance and recommendations.  If you experience "the worst abdominal pain ever" or develop nausea or vomiting, please contact the office immediatly for further recommendations for treatment. ° °ITCHING ° If you experience itching with your medications, try taking only a single pain pill, or even half a pain pill at a time.  You can also use Benadryl over the counter for itching or also to help with sleep.  ° °TED HOSE STOCKINGS °Wear the elastic stockings on both legs for three weeks following surgery during the day but you may remove then at night for sleeping. ° °MEDICATIONS °See your medication summary on the “After Visit Summary” that the nursing staff will review with you prior to discharge.  You may have some home medications which will be placed on hold until you complete the course of blood thinner medication.  It is important for you to complete the blood thinner medication as prescribed by your surgeon.  Continue your approved medications as instructed at time of discharge. ° °PRECAUTIONS °If you experience chest pain or shortness of breath - call 911 immediately for transfer to the hospital emergency department.  °If you develop a fever greater that 101 F, purulent drainage from wound, increased redness or drainage from wound, foul odor from the wound/dressing, or calf pain - CONTACT YOUR SURGEON.   °                                                °FOLLOW-UP APPOINTMENTS °Make sure you keep all of your appointments after your operation with your surgeon and  caregivers. You should call the office at the above phone number and make an appointment for approximately two weeks after the date of your surgery or on the date instructed by your surgeon outlined in the "After Visit Summary". ° °RANGE OF MOTION AND STRENGTHENING EXERCISES  °These exercises are designed to help you keep full movement of your hip joint. Follow your caregiver's or physical therapist's instructions. Perform all exercises about fifteen times, three times per day or as directed. Exercise both hips, even if you have had only one joint replacement. These exercises can be done on a training (exercise) mat, on the floor, on a table or on a bed. Use whatever works the best and is most comfortable for you. Use music or television while you are exercising so that the exercises are a pleasant break in your day. This   will make your life better with the exercises acting as a break in routine you can look forward to.  °Lying on your back, slowly slide your foot toward your buttocks, raising your knee up off the floor. Then slowly slide your foot back down until your leg is straight again.  °Lying on your back spread your legs as far apart as you can without causing discomfort.  °Lying on your side, raise your upper leg and foot straight up from the floor as far as is comfortable. Slowly lower the leg and repeat.  °Lying on your back, tighten up the muscle in the front of your thigh (quadriceps muscles). You can do this by keeping your leg straight and trying to raise your heel off the floor. This helps strengthen the largest muscle supporting your knee.  °Lying on your back, tighten up the muscles of your buttocks both with the legs straight and with the knee bent at a comfortable angle while keeping your heel on the floor.  ° °IF YOU ARE TRANSFERRED TO A SKILLED REHAB FACILITY °If the patient is transferred to a skilled rehab facility following release from the hospital, a list of the current medications will be  sent to the facility for the patient to continue.  When discharged from the skilled rehab facility, please have the facility set up the patient's Home Health Physical Therapy prior to being released. Also, the skilled facility will be responsible for providing the patient with their medications at time of release from the facility to include their pain medication, the muscle relaxants, and their blood thinner medication. If the patient is still at the rehab facility at time of the two week follow up appointment, the skilled rehab facility will also need to assist the patient in arranging follow up appointment in our office and any transportation needs. ° °MAKE SURE YOU:  °Understand these instructions.  °Get help right away if you are not doing well or get worse.  ° ° °Pick up stool softner and laxative for home use following surgery while on pain medications. °Do not submerge incision under water. °Please use good hand washing techniques while changing dressing each day. °May shower starting three days after surgery. °Please use a clean towel to pat the incision dry following showers. °Continue to use ice for pain and swelling after surgery. °Do not use any lotions or creams on the incision until instructed by your surgeon. ° °Information on my medicine - XARELTO® (Rivaroxaban) ° °This medication education was reviewed with me or my healthcare representative as part of my discharge preparation.  The pharmacist that spoke with me during my hospital stay was:  Zariya Minner A, RPH ° °Why was Xarelto® prescribed for you? °Xarelto® was prescribed for you to reduce the risk of blood clots forming after orthopedic surgery. The medical term for these abnormal blood clots is venous thromboembolism (VTE). ° °What do you need to know about xarelto® ? °Take your Xarelto® ONCE DAILY at the same time every day. °You may take it either with or without food. ° °If you have difficulty swallowing the tablet whole, you may crush it  and mix in applesauce just prior to taking your dose. ° °Take Xarelto® exactly as prescribed by your doctor and DO NOT stop taking Xarelto® without talking to the doctor who prescribed the medication.  Stopping without other VTE prevention medication to take the place of Xarelto® may increase your risk of developing a clot. ° °After discharge, you should have regular   check-up appointments with your healthcare provider that is prescribing your Xarelto®.   ° °What do you do if you miss a dose? °If you miss a dose, take it as soon as you remember on the same day then continue your regularly scheduled once daily regimen the next day. Do not take two doses of Xarelto® on the same day.  ° °Important Safety Information °A possible side effect of Xarelto® is bleeding. You should call your healthcare provider right away if you experience any of the following: °? Bleeding from an injury or your nose that does not stop. °? Unusual colored urine (red or dark brown) or unusual colored stools (red or black). °? Unusual bruising for unknown reasons. °? A serious fall or if you hit your head (even if there is no bleeding). ° °Some medicines may interact with Xarelto® and might increase your risk of bleeding while on Xarelto®. To help avoid this, consult your healthcare provider or pharmacist prior to using any new prescription or non-prescription medications, including herbals, vitamins, non-steroidal anti-inflammatory drugs (NSAIDs) and supplements. ° °This website has more information on Xarelto®: www.xarelto.com. ° ° ° °

## 2015-05-10 NOTE — Care Management Note (Signed)
Case Management Note  Patient Details  Name: Crystal Lee MRN: [id number] Date of Birth: November 20, 1933  Subjective/Objective:      LEFT TOTAL HIP ARTHROPLASTY               Action/Plan: NCM spoke to pt and states Outpt PT has been arranged preoperatively by her physician. She has RW at home. Husband at home to assist with her care.   Expected Discharge Date:  05/11/2015               Expected Discharge Plan:  Home/Self Care  In-House Referral:  NA  Discharge planning Services  CM Consult  Post Acute Care Choice:  NA Choice offered to:  NA  DME Arranged:    DME Agency:  NA  HH Arranged:  NA HH Agency:     Status of Service:  Completed, signed off  Medicare Important Message Given:    Date Medicare IM Given:    Medicare IM give by:    Date Additional Medicare IM Given:    Additional Medicare Important Message give by:     If discussed at [place] of Stay Meetings, dates discussed:    Additional Comments:  Erenest Rasher, RN 05/10/2015, 6:09 PM

## 2015-05-10 NOTE — Evaluation (Signed)
Occupational Therapy Evaluation Patient Details Name: Crystal Lee MRN: [id number] DOB: 03-21-1934 Today's Date: 05/10/2015    History of Present Illness 81 yo female s/p left total hip arthroplasty, anterior approach   Clinical Impression   Patient presenting with decreased ADL and functional mobility independence. Patient independent to mod I PTA. Patient currently functioning at an overall supervision to mod assist level. Patient will benefit from acute OT to increase overall independence in the areas of ADLs, functional mobility, and overall safety in order to safely discharge home with husband.     Follow Up Recommendations  No OT follow up;Supervision - Intermittent    Equipment Recommendations  None recommended by OT    Recommendations for Other Services  None at this time    Precautions / Restrictions Precautions Precautions: Fall Restrictions Weight Bearing Restrictions: Yes LLE Weight Bearing: Weight bearing as tolerated    Mobility Bed Mobility General bed mobility comments: Pt found seated in recliner upon OT entering/exiting room  Transfers Overall transfer level: Needs assistance Equipment used: Rolling walker (2 wheeled) Transfers: Sit to/from Stand Sit to Stand: Min guard General transfer comment: cues for safety, hand placement, and LLE management    Balance Overall balance assessment: Needs assistance Sitting-balance support: No upper extremity supported;Feet supported Sitting balance-Leahy Scale: Good     Standing balance support: Bilateral upper extremity supported;During functional activity Standing balance-Leahy Scale: Fair    ADL Overall ADL's : Needs assistance/impaired Eating/Feeding: Set up;Sitting   Grooming: Set up;Sitting   Upper Body Bathing: Set up;Sitting   Lower Body Bathing: Moderate assistance;Sit to/from stand   Upper Body Dressing : Set up;Sitting   Lower Body Dressing: Moderate assistance;Sit to/from stand   Toilet  Transfer: Min guard;RW;Comfort height toilet;Ambulation       Tub/ Shower Transfer: Min guard;Shower seat;Ambulation;Anterior/posterior;Rolling walker;Walk-in shower   Functional mobility during ADLs: Min guard;Rolling walker;Cueing for safety       Vision Vision Assessment?: No apparent visual deficits          Pertinent Vitals/Pain Pain Assessment: Faces Faces Pain Scale: Hurts even more Pain Location: left hip Pain Descriptors / Indicators: Aching;Grimacing;Guarding;Sore Pain Intervention(s): Limited activity within patient's tolerance;Monitored during session;Repositioned     Hand Dominance Right   Extremity/Trunk Assessment Upper Extremity Assessment Upper Extremity Assessment: Overall WFL for tasks assessed   Lower Extremity Assessment Lower Extremity Assessment: Defer to PT evaluation   Cervical / Trunk Assessment Cervical / Trunk Assessment: Normal   Communication Communication Communication: No difficulties   Cognition Arousal/Alertness: Awake/alert Behavior During Therapy: WFL for tasks assessed/performed Overall Cognitive Status: Within Functional Limits for tasks assessed              Home Living Family/patient expects to be discharged to:: Private residence Living Arrangements: Spouse/significant other Available Help at Discharge: Family;Available 24 hours/day Type of Home: House Home Access: Stairs to enter [employer] of Steps: 2 Entrance Stairs-Rails: None Home Layout: One level     Bathroom Shower/Tub: Walk-in shower;Door   Bathroom Toilet: Handicapped height     Home Equipment: Environmental consultant - 2 wheels;Shower seat   Prior Functioning/Environment Level of Independence: Independent     OT Diagnosis: Generalized weakness;Acute pain   OT Problem List: Decreased strength;Decreased range of motion;Decreased activity tolerance;Impaired balance (sitting and/or standing);Decreased safety awareness;Decreased knowledge of use of DME or  AE;Pain   OT Treatment/Interventions: Self-care/ADL training;Therapeutic exercise;Energy conservation;DME and/or AE instruction;Therapeutic activities;Patient/family education;Balance training    OT Goals(Current goals can be found in the care plan section) Acute Rehab OT  Goals Patient Stated Goal: be able to walk to bathroom on my own OT Goal Formulation: With patient Time For Goal Achievement: 05/24/15 Potential to Achieve Goals: Good ADL Goals Pt Will Perform Grooming: with modified independence;standing Pt Will Perform Lower Body Bathing: with modified independence;sit to/from stand Pt Will Perform Lower Body Dressing: with modified independence;sit to/from stand Pt Will Transfer to Toilet: with modified independence;ambulating;bedside commode Pt Will Perform Tub/Shower Transfer: Shower transfer;ambulating;rolling walker;shower seat;with modified independence Additional ADL Goal #1: Pt will be mod I with functional mobility using RW  OT Frequency: Min 2X/week   Barriers to D/C: none known at this time   End of Session Equipment Utilized During Treatment: Gait belt;Rolling walker Nurse Communication: Mobility status  Activity Tolerance: Patient tolerated treatment well Patient left: in chair;with call bell/phone within reach;with chair alarm set   Time: [id number] OT Time Calculation (min): 23 min Charges:  OT General Charges $OT Visit: 1 Procedure OT Evaluation $Initial OT Evaluation Tier I: 1 Procedure OT Treatments $Self Care/Home Management : 8-22 mins  Preeti Winegardner , MS, OTR/L, CLT Pager: [phone]  05/10/2015, 11:34 AM

## 2015-05-10 NOTE — Progress Notes (Signed)
Patient continues to receive PRN analgesic therapy for post-operative site pain, localizable to the left lower extremity. Patient was encouraged routine use of the Incentive Spirometer device along with deep breathing exercises. Patient was instructed to use the device every  hour while awake at 10 breaths per session and coached to optimal performance. Subsequently, patient demonstrated proficiency in the use of the device. Patient adamantly declined urinary catheter removal this morning, despite explanation of the risks involved, opting instead to wait for the physical therapy evaluation prior to removal. Dr. Wynelle Link made aware of patient's declination at 07:10. No new orders received.

## 2015-05-10 NOTE — Progress Notes (Signed)
   Subjective: 1 Day Post-Op Procedure(s) (LRB): LEFT TOTAL HIP ARTHROPLASTY ANTERIOR APPROACH (Left) Patient reports pain as mild.   We will start therapy today.  Plan is to go Home after hospital stay.  Objective: Vital signs in last 24 hours: Temp:  [97.6 F (36.4 C)-98.3 F (36.8 C)] 97.9 F (36.6 C) (12/10 0657) Pulse Rate:  [62-81] 64 (12/10 0657) Resp:  [14-22] 14 (12/10 0657) BP: (107-142)/(40-74) 113/55 mmHg (12/10 0657) SpO2:  [95 %-100 %] 95 % (12/10 0657) Weight:  [106.595 kg (235 lb)] 106.595 kg (235 lb) (12/09 1421)  Intake/Output from previous day:  Intake/Output Summary (Last 24 hours) at 05/10/15 0728 Last data filed at 05/10/15 [id number]  Gross per 24 hour  Intake 2591.67 ml  Output   1930 ml  Net 661.67 ml    Intake/Output this shift: Total I/O In: 120 [P.O.:120] Out: -   Labs:  Recent Labs  05/10/15 0550  HGB 10.9*    Recent Labs  05/10/15 0550  WBC 10.6*  RBC 3.82*  HCT 33.3*  PLT 134*    Recent Labs  05/10/15 0550  NA 136  K 4.5  CL 105  CO2 24  BUN 12  CREATININE 0.64  GLUCOSE 156*  CALCIUM 8.5*   No results for input(s): LABPT, INR in the last 72 hours.  EXAM General - Patient is Alert, Appropriate and Disorganized Extremity - Neurologically intact Neurovascular intact No cellulitis present Compartment soft Dressing - dressing C/D/I Motor Function - intact, moving foot and toes well on exam.  Hemovac pulled without difficulty.  Past Medical History  Diagnosis Date  . Arthritis   . Hyperlipidemia   . Complication of anesthesia     N/V WITH MORPHINE  . PONV (postoperative nausea and vomiting)     PT STATES MORPHINE CAUSED N/V  . Osteopenia   . Fractured hip ([place])     LEFT - AUG 2016  . Rosacea   . GERD (gastroesophageal reflux disease)   . Nocturia   . Sleep apnea   . Difficulty sleeping   . Cancer ([place])     HX BREAST CANCER/ SKIN CANCER    Assessment/Plan: 1 Day Post-Op Procedure(s) (LRB): LEFT TOTAL  HIP ARTHROPLASTY ANTERIOR APPROACH (Left) Principal Problem:   OA (osteoarthritis) of hip   Advance diet Up with therapy D/C IV fluids Plan for discharge tomorrow  Will begin outpatient PT on Monday  DVT Prophylaxis - Xarelto Weight Bearing As Tolerated left Leg Hemovac Pulled Begin Therapy   Gearlean Alf

## 2015-05-10 NOTE — Evaluation (Signed)
Physical Therapy Evaluation Patient Details Name: Crystal Lee MRN: [id number] DOB: March 22, 1934 Today's Date: 05/10/2015   History of Present Illness  81 yo female s/p left total hip arthroplasty, anterior approach  Clinical Impression  Pt s/p L THR presents with decreased L LE strength/ROM and post op pain limiting functional mobility.  Pt should progress to dc home with family assist and HHPT follow up.    Follow Up Recommendations Home health PT    Equipment Recommendations  None recommended by PT    Recommendations for Other Services OT consult     Precautions / Restrictions Precautions Precautions: Fall Restrictions Weight Bearing Restrictions: No LLE Weight Bearing: Weight bearing as tolerated      Mobility  Bed Mobility Overal bed mobility: Needs Assistance Bed Mobility: Supine to Sit     Supine to sit: Min assist     General bed mobility comments: cues for sequence and use of R LE to self assist  Transfers Overall transfer level: Needs assistance Equipment used: Rolling walker (2 wheeled) Transfers: Sit to/from Stand Sit to Stand: Min assist         General transfer comment: cues for safety, hand placement, and LLE management  Ambulation/Gait Ambulation/Gait assistance: Min assist Ambulation Distance (Feet): 200 Feet Assistive device: Rolling walker (2 wheeled) Gait Pattern/deviations: Step-to pattern;Step-through pattern;Decreased step length - right;Decreased step length - left;Shuffle;Trunk flexed     General Gait Details: cues for sequence, posture and position from [employer]            Wheelchair Mobility    Modified Rankin (Stroke Patients Only)       Balance Overall balance assessment: Needs assistance Sitting-balance support: No upper extremity supported;Feet supported Sitting balance-Leahy Scale: Good     Standing balance support: Bilateral upper extremity supported;During functional activity Standing balance-Leahy  Scale: Fair                               Pertinent Vitals/Pain Pain Assessment: 0-10 Pain Score: 8  Faces Pain Scale: Hurts even more Pain Location: L hip Pain Descriptors / Indicators: Aching;Sore Pain Intervention(s): Limited activity within patient's tolerance;Monitored during session;Premedicated before session;Ice applied    Home Living Family/patient expects to be discharged to:: Private residence Living Arrangements: Spouse/significant other Available Help at Discharge: Family;Available 24 hours/day Type of Home: House Home Access: Stairs to enter Entrance Stairs-Rails: None Entrance Stairs-Number of Steps: 2 Home Layout: One level Home Equipment: Walker - 2 wheels;Shower seat      Prior Function Level of Independence: Independent;Independent with assistive device(s)               Hand Dominance   Dominant Hand: Right    Extremity/Trunk Assessment   Upper Extremity Assessment: Overall WFL for tasks assessed           Lower Extremity Assessment: LLE deficits/detail   LLE Deficits / Details: strength at hip 2+/5 with AAROM at hip to 80 flex and 15 abd  Cervical / Trunk Assessment: Normal  Communication   Communication: No difficulties  Cognition Arousal/Alertness: Awake/alert Behavior During Therapy: WFL for tasks assessed/performed Overall Cognitive Status: Within Functional Limits for tasks assessed                      General Comments      Exercises Total Joint Exercises Ankle Circles/Pumps: AROM;Both;15 reps;Supine Quad Sets: AROM;Both;10 reps;Supine Heel Slides: AAROM;Right;15 reps;Supine Hip ABduction/ADduction: AAROM;Right;10 reps;Supine  Assessment/Plan    PT Assessment Patient needs continued PT services  PT Diagnosis Difficulty walking   PT Problem List Decreased strength;Decreased range of motion;Decreased activity tolerance;Decreased mobility;Pain;Decreased knowledge of use of DME;Obesity  PT  Treatment Interventions DME instruction;Gait training;Stair training;Functional mobility training;Therapeutic activities;Therapeutic exercise;Patient/family education   PT Goals (Current goals can be found in the Care Plan section) Acute Rehab PT Goals Patient Stated Goal: be able to walk to bathroom on my own PT Goal Formulation: With patient Time For Goal Achievement: 05/14/15 Potential to Achieve Goals: Good    Frequency 7X/week   Barriers to discharge        Co-evaluation               End of Session Equipment Utilized During Treatment: Gait belt Activity Tolerance: Patient tolerated treatment well Patient left: in chair;with call bell/phone within reach Nurse Communication: Mobility status         Time: [id number] PT Time Calculation (min) (ACUTE ONLY): 27 min   Charges:   PT Evaluation $Initial PT Evaluation Tier I: 1 Procedure PT Treatments $Therapeutic Exercise: 8-22 mins   PT G Codes:        Finnley Lewis 05/22/15, 1:38 PM

## 2015-05-11 LAB — BASIC METABOLIC PANEL
Anion gap: 4 — ABNORMAL LOW (ref 5–15)
BUN: 10 mg/dL (ref 6–20)
CHLORIDE: 107 mmol/L (ref 101–111)
CO2: 26 mmol/L (ref 22–32)
CREATININE: 0.57 mg/dL (ref 0.44–1.00)
Calcium: 8.7 mg/dL — ABNORMAL LOW (ref 8.9–10.3)
Glucose, Bld: 114 mg/dL — ABNORMAL HIGH (ref 65–99)
POTASSIUM: 4.2 mmol/L (ref 3.5–5.1)
SODIUM: 137 mmol/L (ref 135–145)

## 2015-05-11 LAB — CBC
HCT: 31.6 % — ABNORMAL LOW (ref 36.0–46.0)
HEMOGLOBIN: 10.2 g/dL — AB (ref 12.0–15.0)
MCH: 28.3 pg (ref 26.0–34.0)
MCHC: 32.3 g/dL (ref 30.0–36.0)
MCV: 87.8 fL (ref 78.0–100.0)
PLATELETS: 147 10*3/uL — AB (ref 150–400)
RBC: 3.6 MIL/uL — AB (ref 3.87–5.11)
RDW: 13.1 % (ref 11.5–15.5)
WBC: 16.7 10*3/uL — ABNORMAL HIGH (ref 4.0–10.5)

## 2015-05-11 NOTE — Progress Notes (Cosign Needed)
Patient ID: Crystal Lee, female   DOB: 01-22-1934, 81 y.o.   MRN: [id number]    Subjective: 2 Days Post-Op Procedure(s) (LRB): LEFT TOTAL HIP ARTHROPLASTY ANTERIOR APPROACH (Left) Patient reports pain as 4 on 0-10 scale.   Denies CP or SOB.  Voiding without difficulty. Positive flatus. Objective: Vital signs in last 24 hours: Temp:  [98 F (36.7 C)-98.1 F (36.7 C)] 98.1 F (36.7 C) (12/11 0507) Pulse Rate:  [56-66] 65 (12/11 0507) Resp:  [16] 16 (12/11 0507) BP: (116-132)/(37-58) 116/52 mmHg (12/11 0507) SpO2:  [97 %] 97 % (12/11 0507)  Intake/Output from previous day: 12/10 0701 - 12/11 0700 In: 2160 [P.O.:840; I.V.:1320] Out: 650 [Urine:650] Intake/Output this shift:    Labs:  Recent Labs  05/10/15 0550 05/11/15 0532  HGB 10.9* 10.2*    Recent Labs  05/10/15 0550 05/11/15 0532  WBC 10.6* 16.7*  RBC 3.82* 3.60*  HCT 33.3* 31.6*  PLT 134* 147*    Recent Labs  05/10/15 0550 05/11/15 0532  NA 136 137  K 4.5 4.2  CL 105 107  CO2 24 26  BUN 12 10  CREATININE 0.64 0.57  GLUCOSE 156* 114*  CALCIUM 8.5* 8.7*   No results for input(s): LABPT, INR in the last 72 hours.  Physical Exam: Neurologically intact ABD soft Sensation intact distally Incision: no drainage Compartment soft  Assessment/Plan: 2 Days Post-Op Procedure(s) (LRB): LEFT TOTAL HIP ARTHROPLASTY ANTERIOR APPROACH (Left) Advance diet Up with therapy  After cleared by Pt consider DC  Ozie Lupe, Darla Lesches for Dr. Melina Schools [facility] [phone] 05/11/2015, 8:42 AM

## 2015-05-11 NOTE — Progress Notes (Signed)
Utilization review completed.  

## 2015-05-11 NOTE — Progress Notes (Signed)
Discharged from floor via w/c, spouse & belongings with pt. No changes in assessment. Terrisa Curfman  

## 2015-05-11 NOTE — Progress Notes (Signed)
Physical Therapy Treatment Patient Details Name: Crystal Lee MRN: [id number] DOB: 24-Sep-1933 Today's Date: 05/11/2015    History of Present Illness 81 yo female s/p left total hip arthroplasty, anterior approach    PT Comments    Pt progressing well with mobility and eager for dc home.  Reviewed stairs, therex and car transfers.  Follow Up Recommendations  Home health PT     Equipment Recommendations  None recommended by PT    Recommendations for Other Services OT consult     Precautions / Restrictions Precautions Precautions: Fall Restrictions Weight Bearing Restrictions: No LLE Weight Bearing: Weight bearing as tolerated    Mobility  Bed Mobility Overal bed mobility: Needs Assistance Bed Mobility: Supine to Sit     Supine to sit: Min guard     General bed mobility comments: cues for sequence and use of R LE to self assist  Transfers Overall transfer level: Needs assistance Equipment used: Rolling walker (2 wheeled) Transfers: Sit to/from Stand Sit to Stand: Min guard         General transfer comment: cues for safety, hand placement, and LLE management  Ambulation/Gait Ambulation/Gait assistance: Min guard;Supervision Ambulation Distance (Feet): 200 Feet Assistive device: Rolling walker (2 wheeled) Gait Pattern/deviations: Step-to pattern;Step-through pattern;Decreased step length - right;Decreased step length - left;Shuffle;Trunk flexed Gait velocity: decr   General Gait Details: min cues for, posture and position from RW   Stairs Stairs: Yes Stairs assistance: Min assist Stair Management: No rails;Step to pattern;Forwards;With walker Number of Stairs: 1 General stair comments: Single step 3x with RW and cues for placement of RW and feet  Wheelchair Mobility    Modified Rankin (Stroke Patients Only)       Balance                                    Cognition Arousal/Alertness: Awake/alert Behavior During Therapy: WFL  for tasks assessed/performed Overall Cognitive Status: Within Functional Limits for tasks assessed                      Exercises Total Joint Exercises Ankle Circles/Pumps: AROM;Both;15 reps;Supine Quad Sets: AROM;Both;10 reps;Supine Heel Slides: AAROM;Right;Supine;20 reps Hip ABduction/ADduction: AAROM;Right;Supine;15 reps    General Comments        Pertinent Vitals/Pain Pain Assessment: 0-10 Pain Score: 6  Pain Location: L hip and knee Pain Descriptors / Indicators: Aching;Sore Pain Intervention(s): Limited activity within patient's tolerance;Monitored during session;Premedicated before session;Ice applied    Home Living                      Prior Function            PT Goals (current goals can now be found in the care plan section) Acute Rehab PT Goals Patient Stated Goal: be able to walk to bathroom on my own PT Goal Formulation: With patient Time For Goal Achievement: 05/14/15 Potential to Achieve Goals: Good Progress towards PT goals: Progressing toward goals    Frequency  7X/week    PT Plan Current plan remains appropriate    Co-evaluation             End of Session   Activity Tolerance: Patient tolerated treatment well Patient left: in chair;with call bell/phone within reach     Time: 1005-1043 PT Time Calculation (min) (ACUTE ONLY): 38 min  Charges:  $Gait Training: 23-37 mins $Therapeutic Exercise: 8-22 mins  G Codes:      Crystal Lee 06/03/2015, 1:07 PM

## 2015-05-12 ENCOUNTER — Encounter (HOSPITAL_COMMUNITY): Payer: Self-pay | Admitting: Orthopedic Surgery

## 2015-05-12 LAB — TYPE AND SCREEN
ABO/RH(D): O POS
Antibody Screen: NEGATIVE

## 2015-05-12 NOTE — Discharge Summary (Signed)
Physician Discharge Summary  Patient ID: Crystal Lee MRN: [id number] DOB/AGE: Oct 01, 1933 81 y.o.  Admit date: 05/09/2015 Discharge date: 05/12/2015  Admission Diagnoses:  OA (osteoarthritis) of hip  Discharge Diagnoses:  Principal Problem:   OA (osteoarthritis) of hip   Past Medical History  Diagnosis Date  . Arthritis   . Hyperlipidemia   . Complication of anesthesia     N/V WITH MORPHINE  . PONV (postoperative nausea and vomiting)     PT STATES MORPHINE CAUSED N/V  . Osteopenia   . Fractured hip ([place])     LEFT - AUG 2016  . Rosacea   . GERD (gastroesophageal reflux disease)   . Nocturia   . Sleep apnea   . Difficulty sleeping   . Cancer ([place])     HX BREAST CANCER/ SKIN CANCER    Surgeries: Procedure(s): LEFT TOTAL HIP ARTHROPLASTY ANTERIOR APPROACH on 05/09/2015   Consultants (if any):    Discharged Condition: Improved  Hospital Course: Crystal Lee is an 81 y.o. female who was admitted 05/09/2015 with a diagnosis of OA (osteoarthritis) of hip and went to the operating room on 05/09/2015 and underwent the above named procedures.  Pt discharged on 05/11/15.   She was given perioperative antibiotics:  Anti-infectives    Start     Dose/Rate Route Frequency Ordered Stop   05/10/15 0600  ceFAZolin (ANCEF) IVPB 2 g/50 mL premix     2 g 100 mL/hr over 30 Minutes Intravenous On call to O.R. 05/09/15 1344 05/09/15 1551   05/09/15 2100  ceFAZolin (ANCEF) IVPB 2 g/50 mL premix     2 g 100 mL/hr over 30 Minutes Intravenous Every 6 hours 05/09/15 1830 05/10/15 0241    .  She was given sequential compression devices, early ambulation, and TED for DVT prophylaxis.  She benefited maximally from the hospital stay and there were no complications.    Recent vital signs:  Filed Vitals:   05/10/15 2330 05/11/15 0507  BP: 125/44 116/52  Pulse: 56 65  Temp:  98.1 F (36.7 C)  Resp:  16    Recent laboratory studies:  Lab Results  Component Value Date   HGB 10.2*  05/11/2015   HGB 10.9* 05/10/2015   HGB 13.6 05/05/2015   Lab Results  Component Value Date   WBC 16.7* 05/11/2015   PLT 147* 05/11/2015   Lab Results  Component Value Date   INR 0.99 05/05/2015   Lab Results  Component Value Date   NA 137 05/11/2015   K 4.2 05/11/2015   CL 107 05/11/2015   CO2 26 05/11/2015   BUN 10 05/11/2015   CREATININE 0.57 05/11/2015   GLUCOSE 114* 05/11/2015    Discharge Medications:     Medication List    STOP taking these medications        alendronate 70 MG tablet  Commonly known as:  FOSAMAX     HYDROcodone-acetaminophen 7.5-325 MG tablet  Commonly known as:  NORCO     vitamin B-12 1000 MCG tablet  Commonly known as:  CYANOCOBALAMIN     Vitamin D (Ergocalciferol) 50000 UNITS Caps capsule  Commonly known as:  DRISDOL      TAKE these medications        atorvastatin 20 MG tablet  Commonly known as:  LIPITOR  Take 20 mg by mouth daily.     methocarbamol 500 MG tablet  Commonly known as:  ROBAXIN  Take 1 tablet (500 mg total) by mouth every 6 (six) hours as needed  for muscle spasms.     nitrofurantoin (macrocrystal-monohydrate) 100 MG capsule  Commonly known as:  MACROBID  Take 100 mg by mouth daily.     oxyCODONE 5 MG immediate release tablet  Commonly known as:  Oxy IR/ROXICODONE  Take 1-2 tablets (5-10 mg total) by mouth every 3 (three) hours as needed for breakthrough pain.     rivaroxaban 10 MG Tabs tablet  Commonly known as:  XARELTO  Take 1 tablet (10 mg total) by mouth daily with breakfast.     traMADol 50 MG tablet  Commonly known as:  ULTRAM  Take 1-2 tablets (50-100 mg total) by mouth every 6 (six) hours as needed for moderate pain.        Diagnostic Studies: Dg Pelvis Portable  05/09/2015  CLINICAL DATA:  Postop left anterior hip replacement. EXAM: PORTABLE PELVIS 1-2 VIEWS COMPARISON:  None. FINDINGS: Single AP view demonstrates total left hip arthroplasty in expected alignment. No periprosthetic lucency.  Recent postsurgical change includes air in the soft tissues and drain in place. A right hip arthroplasty is partially included. IMPRESSION: Left hip arthroplasty without immediate postoperative complication. Electronically Signed   By: Jeb Levering M.D.   On: 05/09/2015 18:47   Dg C-arm 1-60 Min-no Report  05/09/2015  CLINICAL DATA: hip C-ARM 1-60 MINUTES Fluoroscopy was utilized by the requesting physician.  No radiographic interpretation.    Disposition: 06-Home-Health Care Svc        Follow-up Information    Follow up with Gearlean Alf, MD. Schedule an appointment as soon as possible for a visit on 05/23/2015.   Specialty:  Orthopedic Surgery   Why:  Call [phone] Monday to make the appointment   Contact information:   [address] [place] [address] [id number]        Signed: Valinda Hoar 05/12/2015, 9:21 AM

## 2015-06-03 DIAGNOSIS — M1612 Unilateral primary osteoarthritis, left hip: Secondary | ICD-10-CM | POA: Diagnosis not present

## 2015-06-05 DIAGNOSIS — M1612 Unilateral primary osteoarthritis, left hip: Secondary | ICD-10-CM | POA: Diagnosis not present

## 2015-06-09 DIAGNOSIS — M1612 Unilateral primary osteoarthritis, left hip: Secondary | ICD-10-CM | POA: Diagnosis not present

## 2015-06-11 DIAGNOSIS — M1612 Unilateral primary osteoarthritis, left hip: Secondary | ICD-10-CM | POA: Diagnosis not present

## 2015-06-13 DIAGNOSIS — M1612 Unilateral primary osteoarthritis, left hip: Secondary | ICD-10-CM | POA: Diagnosis not present

## 2015-06-16 DIAGNOSIS — M1612 Unilateral primary osteoarthritis, left hip: Secondary | ICD-10-CM | POA: Diagnosis not present

## 2015-06-17 DIAGNOSIS — Z471 Aftercare following joint replacement surgery: Secondary | ICD-10-CM | POA: Diagnosis not present

## 2015-06-17 DIAGNOSIS — Z96642 Presence of left artificial hip joint: Secondary | ICD-10-CM | POA: Diagnosis not present

## 2015-06-18 DIAGNOSIS — M1612 Unilateral primary osteoarthritis, left hip: Secondary | ICD-10-CM | POA: Diagnosis not present

## 2015-08-05 DIAGNOSIS — Z96642 Presence of left artificial hip joint: Secondary | ICD-10-CM | POA: Diagnosis not present

## 2015-08-05 DIAGNOSIS — Z471 Aftercare following joint replacement surgery: Secondary | ICD-10-CM | POA: Diagnosis not present

## 2015-08-06 ENCOUNTER — Encounter (HOSPITAL_COMMUNITY): Payer: Self-pay

## 2015-08-06 ENCOUNTER — Emergency Department (HOSPITAL_COMMUNITY)
Admission: EM | Admit: 2015-08-06 | Discharge: 2015-08-07 | Disposition: A | Discharge status: Home / Self Care | Payer: Medicare Other | Attending: Emergency Medicine | Admitting: Emergency Medicine

## 2015-08-06 ENCOUNTER — Emergency Department (HOSPITAL_COMMUNITY): Payer: Medicare Other

## 2015-08-06 DIAGNOSIS — Z8781 Personal history of (healed) traumatic fracture: Secondary | ICD-10-CM | POA: Diagnosis not present

## 2015-08-06 DIAGNOSIS — R0602 Shortness of breath: Secondary | ICD-10-CM | POA: Diagnosis not present

## 2015-08-06 DIAGNOSIS — Z4789 Encounter for other orthopedic aftercare: Secondary | ICD-10-CM | POA: Diagnosis not present

## 2015-08-06 DIAGNOSIS — J111 Influenza due to unidentified influenza virus with other respiratory manifestations: Secondary | ICD-10-CM

## 2015-08-06 DIAGNOSIS — K219 Gastro-esophageal reflux disease without esophagitis: Secondary | ICD-10-CM | POA: Diagnosis not present

## 2015-08-06 DIAGNOSIS — Z87891 Personal history of nicotine dependence: Secondary | ICD-10-CM | POA: Insufficient documentation

## 2015-08-06 DIAGNOSIS — M1990 Unspecified osteoarthritis, unspecified site: Secondary | ICD-10-CM | POA: Diagnosis not present

## 2015-08-06 DIAGNOSIS — Z872 Personal history of diseases of the skin and subcutaneous tissue: Secondary | ICD-10-CM | POA: Diagnosis not present

## 2015-08-06 DIAGNOSIS — Z79899 Other long term (current) drug therapy: Secondary | ICD-10-CM | POA: Diagnosis not present

## 2015-08-06 DIAGNOSIS — Z8669 Personal history of other diseases of the nervous system and sense organs: Secondary | ICD-10-CM | POA: Insufficient documentation

## 2015-08-06 DIAGNOSIS — E785 Hyperlipidemia, unspecified: Secondary | ICD-10-CM | POA: Diagnosis not present

## 2015-08-06 DIAGNOSIS — Z853 Personal history of malignant neoplasm of breast: Secondary | ICD-10-CM | POA: Insufficient documentation

## 2015-08-06 DIAGNOSIS — J09X1 Influenza due to identified novel influenza A virus with pneumonia: Secondary | ICD-10-CM | POA: Diagnosis not present

## 2015-08-06 DIAGNOSIS — R509 Fever, unspecified: Secondary | ICD-10-CM | POA: Diagnosis not present

## 2015-08-06 DIAGNOSIS — R05 Cough: Secondary | ICD-10-CM | POA: Diagnosis not present

## 2015-08-06 DIAGNOSIS — M65331 Trigger finger, right middle finger: Secondary | ICD-10-CM | POA: Diagnosis not present

## 2015-08-06 DIAGNOSIS — M65321 Trigger finger, right index finger: Secondary | ICD-10-CM | POA: Diagnosis not present

## 2015-08-06 LAB — CBC WITH DIFFERENTIAL/PLATELET
Basophils Absolute: 0 10*3/uL (ref 0.0–0.1)
Basophils Relative: 0 %
EOS PCT: 0 %
Eosinophils Absolute: 0 10*3/uL (ref 0.0–0.7)
HEMATOCRIT: 40.2 % (ref 36.0–46.0)
Hemoglobin: 13 g/dL (ref 12.0–15.0)
LYMPHS ABS: 2 10*3/uL (ref 0.7–4.0)
LYMPHS PCT: 43 %
MCH: 27.4 pg (ref 26.0–34.0)
MCHC: 32.3 g/dL (ref 30.0–36.0)
MCV: 84.8 fL (ref 78.0–100.0)
MONO ABS: 0.5 10*3/uL (ref 0.1–1.0)
MONOS PCT: 12 %
NEUTROS ABS: 2 10*3/uL (ref 1.7–7.7)
Neutrophils Relative %: 45 %
PLATELETS: 124 10*3/uL — AB (ref 150–400)
RBC: 4.74 MIL/uL (ref 3.87–5.11)
RDW: 13.7 % (ref 11.5–15.5)
WBC: 4.6 10*3/uL (ref 4.0–10.5)

## 2015-08-06 LAB — I-STAT CG4 LACTIC ACID, ED: Lactic Acid, Venous: 0.84 mmol/L (ref 0.5–2.0)

## 2015-08-06 LAB — BASIC METABOLIC PANEL
ANION GAP: 11 (ref 5–15)
BUN: 15 mg/dL (ref 6–20)
CHLORIDE: 105 mmol/L (ref 101–111)
CO2: 22 mmol/L (ref 22–32)
Calcium: 8.6 mg/dL — ABNORMAL LOW (ref 8.9–10.3)
Creatinine, Ser: 0.67 mg/dL (ref 0.44–1.00)
GFR calc Af Amer: >60 mL/min (ref >60)
GFR calc non Af Amer: >60 mL/min (ref >60)
GLUCOSE: 100 mg/dL — AB (ref 65–99)
Potassium: 3.6 mmol/L (ref 3.5–5.1)
Sodium: 138 mmol/L (ref 135–145)

## 2015-08-06 MED ORDER — OSELTAMIVIR PHOSPHATE 75 MG PO CAPS
75.0000 mg | ORAL_CAPSULE | Freq: Once | ORAL | Status: AC
  Administered 2015-08-06: 75 mg via ORAL
  Filled 2015-08-06: qty 1

## 2015-08-06 MED ORDER — SODIUM CHLORIDE 0.9 % IV BOLUS (SEPSIS)
500.0000 mL | Freq: Once | INTRAVENOUS | Status: AC
  Administered 2015-08-06: 500 mL via INTRAVENOUS

## 2015-08-06 MED ORDER — SODIUM CHLORIDE 0.9 % IV SOLN: INTRAVENOUS | Status: DC

## 2015-08-06 NOTE — ED Provider Notes (Signed)
CSN: [id number]     Arrival date & time 08/06/15  1615 History   First MD Ini Influenza and low risk, elderly female. No concerning influenzatiated Contact with Patient 08/06/15 2143     Chief Complaint  Patient presents with  . Cough  . Generalized Body Aches     (Consider location/radiation/quality/duration/timing/severity/associated sxs/prior Treatment) HPI   Crystal Lee is a 82 y.o. femaleWho presents for evaluation of cough, nonproductive, myalgia, fever, headache, nausea and vomiting, for several days. She was seen at an urgent care today, and diagnosed with pneumonia and tested positive for influenza A and B. She denies dizziness, weakness persistent headache, blurred vision, or near-syncope. There are no other known modifying factors.   Past Medical History  Diagnosis Date  . Arthritis   . Hyperlipidemia   . Complication of anesthesia     N/V WITH MORPHINE  . PONV (postoperative nausea and vomiting)     PT STATES MORPHINE CAUSED N/V  . Osteopenia   . Fractured hip ([name])     LEFT - AUG 2016  . Rosacea   . GERD (gastroesophageal reflux disease)   . Nocturia   . Sleep apnea   . Difficulty sleeping   . Cancer ([place])     HX BREAST CANCER/ SKIN CANCER   Past Surgical History  Procedure Laterality Date  . Breast surgery    . Cholecystectomy    . Total hip arthroplasty  2010    RIGHT  . Mastectomy  1998    BILATERAL   . Joint replacement      RT TOTAL HIP / RT TOTAL KNEE  . Total knee arthroplasty  2001  . Back surgery  1973  . Skin cancer excision  2016    RT SIDE OF NOSE  . Appendectomy  1949  . Tumor removal  2012    ABDOMINAL - NON CANCEROUS  . Total hip arthroplasty Left 05/09/2015    Procedure: LEFT TOTAL HIP ARTHROPLASTY ANTERIOR APPROACH;  Surgeon: Gaynelle Arabian, MD;  Location: [facility];  Service: Orthopedics;  Laterality: Left;   History reviewed. No pertinent family history. Social History  Substance Use Topics  . Smoking status: Former Smoker    Quit  date: 02/03/1976  . Smokeless tobacco: None  . Alcohol Use: No   OB History    No data available     Review of Systems  All other systems reviewed and are negative.     Allergies  Review of patient's allergies indicates no known allergies.  Home Medications   Prior to Admission medications   Medication Sig Start Date End Date Taking? Authorizing Provider  alendronate (FOSAMAX) 70 MG tablet Take 70 mg by mouth every Wednesday. 07/05/15  Yes Historical Provider, MD  atorvastatin (LIPITOR) 20 MG tablet Take 20 mg by mouth daily.   Yes Historical Provider, MD  HYDROcodone-acetaminophen (NORCO) 7.5-325 MG tablet Take 1 tablet by mouth every 6 (six) hours as needed. pain 07/29/15  Yes Historical Provider, MD  NEXIUM 40 MG capsule Take 40 mg by mouth daily as needed. Acid reflux/ heartburn 07/05/15  Yes Historical Provider, MD  nitrofurantoin, macrocrystal-monohydrate, (MACROBID) 100 MG capsule Take 100 mg by mouth daily as needed (prevent uti/ burning/ irritation of vaginal area).    Yes Historical Provider, MD  traMADol (ULTRAM) 50 MG tablet Take 1-2 tablets (50-100 mg total) by mouth every 6 (six) hours as needed for moderate pain. 05/10/15  Yes Gaynelle Arabian, MD  Vitamin D, Ergocalciferol, (DRISDOL) 50000 units CAPS capsule  Take 1 capsule by mouth every Tuesday. 07/05/15  Yes Historical Provider, MD  methocarbamol (ROBAXIN) 500 MG tablet Take 1 tablet (500 mg total) by mouth every 6 (six) hours as needed for muscle spasms. Patient not taking: Reported on 08/06/2015 05/10/15   Gaynelle Arabian, MD  oseltamivir (TAMIFLU) 75 MG capsule Take 1 capsule (75 mg total) by mouth every 12 (twelve) hours. 08/07/15   Daleen Bo, MD  oxyCODONE (OXY IR/ROXICODONE) 5 MG immediate release tablet Take 1-2 tablets (5-10 mg total) by mouth every 3 (three) hours as needed for breakthrough pain. Patient not taking: Reported on 08/06/2015 05/10/15   Gaynelle Arabian, MD  rivaroxaban (XARELTO) 10 MG TABS tablet Take 1 tablet  (10 mg total) by mouth daily with breakfast. Patient not taking: Reported on 08/06/2015 05/10/15   Gaynelle Arabian, MD   BP 138/74 mmHg  Pulse 82  Temp(Src) 98.9 F (37.2 C) (Oral)  Resp 18  SpO2 98% Physical Exam  Constitutional: She is oriented to person, place, and time. She appears well-developed and well-nourished.  HENT:  Head: Normocephalic and atraumatic.  Right Ear: External ear normal.  Left Ear: External ear normal.  Eyes: Conjunctivae and EOM are normal. Pupils are equal, round, and reactive to light.  Neck: Normal range of motion and phonation normal. Neck supple.  Cardiovascular: Normal rate, regular rhythm and normal heart sounds.   Pulmonary/Chest: Effort normal and breath sounds normal. She exhibits no bony tenderness.  Abdominal: Soft. There is no tenderness.  Musculoskeletal: Normal range of motion.  Neurological: She is alert and oriented to person, place, and time. No cranial nerve deficit or sensory deficit. She exhibits normal muscle tone. Coordination normal.  Skin: Skin is warm, dry and intact.  Psychiatric: She has a normal mood and affect. Her behavior is normal. Judgment and thought content normal.  Nursing note and vitals reviewed.   ED Course  Procedures (including critical care time)  Medications  sodium chloride 0.9 % bolus 500 mL (0 mLs Intravenous Stopped 08/07/15 0052)  oseltamivir (TAMIFLU) capsule 75 mg (75 mg Oral Given 08/06/15 2302)    No data found.   At D/C Reevaluation with update and discussion. After initial assessment and treatment, an updated evaluation reveals no further complaints. Findings discussed with patient, all questions answered. Mohsen Odenthal L    Labs Review Labs Reviewed  BASIC METABOLIC PANEL - Abnormal; Notable for the following:    Glucose, Bld 100 (*)    Calcium 8.6 (*)    All other components within normal limits  CBC WITH DIFFERENTIAL/PLATELET - Abnormal; Notable for the following:    Platelets 124 (*)    All  other components within normal limits  I-STAT CG4 LACTIC ACID, ED    Imaging Review No results found. I have personally reviewed and evaluated these images and lab results as part of my medical decision-making.   EKG Interpretation None      MDM   Final diagnoses:  Influenza    Influenza in low risk elderly patient. No concerning markers for impending vascular collapse or SBI.  Nursing Notes Reviewed/ Care Coordinated Applicable Imaging Reviewed Interpretation of Laboratory Data incorporated into ED treatment  The patient appears reasonably screened and/or stabilized for discharge and I doubt any other medical condition or other [facility] requiring further screening, evaluation, or treatment in the ED at this time prior to discharge.  Plan: Home Medications- Tamiflu; Home Treatments- rest, fluids; return here if the recommended treatment, does not improve the symptoms; Recommended follow up- PCP  prn     Daleen Bo, MD 08/09/15 2136

## 2015-08-06 NOTE — ED Notes (Addendum)
Pt with cough, body aches, ? Fever, headache, emesis,  Since Monday.  Went to a urgent care first today and sent here for dx pneumonia.

## 2015-08-06 NOTE — ED Notes (Addendum)
Pt unable to give specimen, given water and ginger ale.

## 2015-08-07 DIAGNOSIS — J111 Influenza due to unidentified influenza virus with other respiratory manifestations: Secondary | ICD-10-CM | POA: Diagnosis not present

## 2015-08-07 MED ORDER — OSELTAMIVIR PHOSPHATE 75 MG PO CAPS: 75.0000 mg | ORAL_CAPSULE | Freq: Two times a day (BID) | ORAL | Status: DC

## 2015-08-07 NOTE — Discharge Instructions (Signed)
Get plenty of rest, drink a lot of fluids. Use Tylenol or Motrin for pain and fever. Return here if needed, for problems.   Influenza, Adult Influenza ("the flu") is a viral infection of the respiratory tract. It occurs more often in winter months because people spend more time in close contact with one another. Influenza can make you feel very sick. Influenza easily spreads from person to person (contagious). CAUSES  Influenza is caused by a virus that infects the respiratory tract. You can catch the virus by breathing in droplets from an infected person's cough or sneeze. You can also catch the virus by touching something that was recently contaminated with the virus and then touching your mouth, nose, or eyes. RISKS AND COMPLICATIONS You may be at risk for a more severe case of influenza if you smoke cigarettes, have diabetes, have chronic heart disease (such as heart failure) or lung disease (such as asthma), or if you have a weakened immune system. Elderly people and pregnant women are also at risk for more serious infections. The most common problem of influenza is a lung infection (pneumonia). Sometimes, this problem can require emergency medical care and may be life threatening. SIGNS AND SYMPTOMS  Symptoms typically last 4 to 10 days and may include:  Fever.  Chills.  Headache, body aches, and muscle aches.  Sore throat.  Chest discomfort and cough.  Poor appetite.  Weakness or feeling tired.  Dizziness.  Nausea or vomiting. DIAGNOSIS  Diagnosis of influenza is often made based on your history and a physical exam. A nose or throat swab test can be done to confirm the diagnosis. TREATMENT  In mild cases, influenza goes away on its own. Treatment is directed at relieving symptoms. For more severe cases, your health care provider may prescribe antiviral medicines to shorten the sickness. Antibiotic medicines are not effective because the infection is caused by a virus, not by  bacteria. HOME CARE INSTRUCTIONS  Take medicines only as directed by your health care provider.  Use a cool mist humidifier to make breathing easier.  Get plenty of rest until your temperature returns to normal. This usually takes 3 to 4 days.  Drink enough fluid to keep your urine clear or pale yellow.  Cover yourmouth and nosewhen coughing or sneezing,and wash your handswellto prevent thevirusfrom spreading.  Stay homefromwork orschool untilthe fever is gonefor at least 9full day. PREVENTION  An annual influenza vaccination (flu shot) is the best way to avoid getting influenza. An annual flu shot is now routinely recommended for all adults in the [place] IF:  You experiencechest pain, yourcough worsens,or you producemore mucus.  Youhave nausea,vomiting, ordiarrhea.  Your fever returns or gets worse. SEEK IMMEDIATE MEDICAL CARE IF:  You havetrouble breathing, you become short of breath,or your skin ornails becomebluish.  You have severe painor stiffnessin the neck.  You develop a sudden headache, or pain in the face or ear.  You have nausea or vomiting that you cannot control. MAKE SURE YOU:   Understand these instructions.  Will watch your condition.  Will get help right away if you are not doing well or get worse.   This information is not intended to replace advice given to you by your health care provider. Make sure you discuss any questions you have with your health care provider.   Document Released: 05/14/2000 Document Revised: 06/07/2014 Document Reviewed: 08/16/2011 Elsevier Interactive Patient Education [employer].

## 2015-08-11 DIAGNOSIS — R05 Cough: Secondary | ICD-10-CM | POA: Diagnosis not present

## 2015-08-21 DIAGNOSIS — R05 Cough: Secondary | ICD-10-CM | POA: Diagnosis not present

## 2015-09-09 DIAGNOSIS — L309 Dermatitis, unspecified: Secondary | ICD-10-CM | POA: Diagnosis not present

## 2015-09-09 DIAGNOSIS — R1310 Dysphagia, unspecified: Secondary | ICD-10-CM | POA: Diagnosis not present

## 2015-09-09 DIAGNOSIS — L905 Scar conditions and fibrosis of skin: Secondary | ICD-10-CM | POA: Diagnosis not present

## 2015-09-09 DIAGNOSIS — L728 Other follicular cysts of the skin and subcutaneous tissue: Secondary | ICD-10-CM | POA: Diagnosis not present

## 2015-09-09 DIAGNOSIS — D485 Neoplasm of uncertain behavior of skin: Secondary | ICD-10-CM | POA: Diagnosis not present

## 2015-09-09 DIAGNOSIS — L570 Actinic keratosis: Secondary | ICD-10-CM | POA: Diagnosis not present

## 2015-09-22 DIAGNOSIS — L570 Actinic keratosis: Secondary | ICD-10-CM | POA: Diagnosis not present

## 2015-09-22 DIAGNOSIS — C44311 Basal cell carcinoma of skin of nose: Secondary | ICD-10-CM | POA: Diagnosis not present

## 2015-09-30 DIAGNOSIS — K2970 Gastritis, unspecified, without bleeding: Secondary | ICD-10-CM | POA: Diagnosis not present

## 2015-09-30 DIAGNOSIS — E784 Other hyperlipidemia: Secondary | ICD-10-CM | POA: Diagnosis not present

## 2015-09-30 DIAGNOSIS — M810 Age-related osteoporosis without current pathological fracture: Secondary | ICD-10-CM | POA: Diagnosis not present

## 2015-09-30 DIAGNOSIS — L821 Other seborrheic keratosis: Secondary | ICD-10-CM | POA: Diagnosis not present

## 2015-09-30 DIAGNOSIS — R300 Dysuria: Secondary | ICD-10-CM | POA: Diagnosis not present

## 2015-09-30 DIAGNOSIS — L820 Inflamed seborrheic keratosis: Secondary | ICD-10-CM | POA: Diagnosis not present

## 2015-10-02 DIAGNOSIS — Z86010 Personal history of colonic polyps: Secondary | ICD-10-CM | POA: Diagnosis not present

## 2015-10-02 DIAGNOSIS — K635 Polyp of colon: Secondary | ICD-10-CM | POA: Diagnosis not present

## 2015-10-02 DIAGNOSIS — D123 Benign neoplasm of transverse colon: Secondary | ICD-10-CM | POA: Diagnosis not present

## 2015-10-02 DIAGNOSIS — D124 Benign neoplasm of descending colon: Secondary | ICD-10-CM | POA: Diagnosis not present

## 2015-10-14 DIAGNOSIS — M65331 Trigger finger, right middle finger: Secondary | ICD-10-CM | POA: Diagnosis not present

## 2015-10-14 DIAGNOSIS — M65321 Trigger finger, right index finger: Secondary | ICD-10-CM | POA: Diagnosis not present

## 2015-10-19 DIAGNOSIS — R109 Unspecified abdominal pain: Secondary | ICD-10-CM | POA: Diagnosis not present

## 2015-10-19 DIAGNOSIS — R350 Frequency of micturition: Secondary | ICD-10-CM | POA: Diagnosis not present

## 2015-10-19 DIAGNOSIS — R300 Dysuria: Secondary | ICD-10-CM | POA: Diagnosis not present

## 2015-10-22 DIAGNOSIS — Z4789 Encounter for other orthopedic aftercare: Secondary | ICD-10-CM | POA: Diagnosis not present

## 2015-10-28 DIAGNOSIS — M65321 Trigger finger, right index finger: Secondary | ICD-10-CM | POA: Diagnosis not present

## 2015-10-28 DIAGNOSIS — M65331 Trigger finger, right middle finger: Secondary | ICD-10-CM | POA: Diagnosis not present

## 2015-10-31 DIAGNOSIS — Z961 Presence of intraocular lens: Secondary | ICD-10-CM | POA: Diagnosis not present

## 2015-10-31 DIAGNOSIS — H524 Presbyopia: Secondary | ICD-10-CM | POA: Diagnosis not present

## 2015-11-03 DIAGNOSIS — M65331 Trigger finger, right middle finger: Secondary | ICD-10-CM | POA: Diagnosis not present

## 2015-11-13 DIAGNOSIS — M65331 Trigger finger, right middle finger: Secondary | ICD-10-CM | POA: Diagnosis not present

## 2015-11-14 DIAGNOSIS — Z4789 Encounter for other orthopedic aftercare: Secondary | ICD-10-CM | POA: Diagnosis not present

## 2015-11-21 DIAGNOSIS — Z96642 Presence of left artificial hip joint: Secondary | ICD-10-CM | POA: Diagnosis not present

## 2015-11-21 DIAGNOSIS — M25552 Pain in left hip: Secondary | ICD-10-CM | POA: Diagnosis not present

## 2015-11-21 DIAGNOSIS — M7062 Trochanteric bursitis, left hip: Secondary | ICD-10-CM | POA: Diagnosis not present

## 2015-11-21 DIAGNOSIS — Z471 Aftercare following joint replacement surgery: Secondary | ICD-10-CM | POA: Diagnosis not present

## 2015-12-11 DIAGNOSIS — M4720 Other spondylosis with radiculopathy, site unspecified: Secondary | ICD-10-CM | POA: Diagnosis not present

## 2015-12-19 DIAGNOSIS — M4806 Spinal stenosis, lumbar region: Secondary | ICD-10-CM | POA: Diagnosis not present

## 2015-12-19 DIAGNOSIS — M5416 Radiculopathy, lumbar region: Secondary | ICD-10-CM | POA: Diagnosis not present

## 2015-12-19 DIAGNOSIS — M4720 Other spondylosis with radiculopathy, site unspecified: Secondary | ICD-10-CM | POA: Diagnosis not present

## 2015-12-19 DIAGNOSIS — M47816 Spondylosis without myelopathy or radiculopathy, lumbar region: Secondary | ICD-10-CM | POA: Diagnosis not present

## 2015-12-19 DIAGNOSIS — M5116 Intervertebral disc disorders with radiculopathy, lumbar region: Secondary | ICD-10-CM | POA: Diagnosis not present

## 2015-12-19 DIAGNOSIS — M6281 Muscle weakness (generalized): Secondary | ICD-10-CM | POA: Diagnosis not present

## 2015-12-19 DIAGNOSIS — R2689 Other abnormalities of gait and mobility: Secondary | ICD-10-CM | POA: Diagnosis not present

## 2015-12-19 DIAGNOSIS — M545 Low back pain: Secondary | ICD-10-CM | POA: Diagnosis not present

## 2015-12-23 DIAGNOSIS — M4720 Other spondylosis with radiculopathy, site unspecified: Secondary | ICD-10-CM | POA: Diagnosis not present

## 2015-12-23 DIAGNOSIS — M6281 Muscle weakness (generalized): Secondary | ICD-10-CM | POA: Diagnosis not present

## 2015-12-23 DIAGNOSIS — M545 Low back pain: Secondary | ICD-10-CM | POA: Diagnosis not present

## 2015-12-23 DIAGNOSIS — R2689 Other abnormalities of gait and mobility: Secondary | ICD-10-CM | POA: Diagnosis not present

## 2015-12-25 DIAGNOSIS — M4720 Other spondylosis with radiculopathy, site unspecified: Secondary | ICD-10-CM | POA: Diagnosis not present

## 2015-12-25 DIAGNOSIS — M545 Low back pain: Secondary | ICD-10-CM | POA: Diagnosis not present

## 2015-12-25 DIAGNOSIS — M6281 Muscle weakness (generalized): Secondary | ICD-10-CM | POA: Diagnosis not present

## 2015-12-25 DIAGNOSIS — R2689 Other abnormalities of gait and mobility: Secondary | ICD-10-CM | POA: Diagnosis not present

## 2015-12-29 DIAGNOSIS — M6281 Muscle weakness (generalized): Secondary | ICD-10-CM | POA: Diagnosis not present

## 2015-12-29 DIAGNOSIS — M4720 Other spondylosis with radiculopathy, site unspecified: Secondary | ICD-10-CM | POA: Diagnosis not present

## 2015-12-29 DIAGNOSIS — M545 Low back pain: Secondary | ICD-10-CM | POA: Diagnosis not present

## 2015-12-29 DIAGNOSIS — R2689 Other abnormalities of gait and mobility: Secondary | ICD-10-CM | POA: Diagnosis not present

## 2015-12-31 DIAGNOSIS — R2689 Other abnormalities of gait and mobility: Secondary | ICD-10-CM | POA: Diagnosis not present

## 2015-12-31 DIAGNOSIS — M4720 Other spondylosis with radiculopathy, site unspecified: Secondary | ICD-10-CM | POA: Diagnosis not present

## 2015-12-31 DIAGNOSIS — M545 Low back pain: Secondary | ICD-10-CM | POA: Diagnosis not present

## 2015-12-31 DIAGNOSIS — M6281 Muscle weakness (generalized): Secondary | ICD-10-CM | POA: Diagnosis not present

## 2016-01-05 DIAGNOSIS — M6281 Muscle weakness (generalized): Secondary | ICD-10-CM | POA: Diagnosis not present

## 2016-01-05 DIAGNOSIS — M4720 Other spondylosis with radiculopathy, site unspecified: Secondary | ICD-10-CM | POA: Diagnosis not present

## 2016-01-05 DIAGNOSIS — M545 Low back pain: Secondary | ICD-10-CM | POA: Diagnosis not present

## 2016-01-05 DIAGNOSIS — R2689 Other abnormalities of gait and mobility: Secondary | ICD-10-CM | POA: Diagnosis not present

## 2016-01-07 DIAGNOSIS — M545 Low back pain: Secondary | ICD-10-CM | POA: Diagnosis not present

## 2016-01-07 DIAGNOSIS — M4720 Other spondylosis with radiculopathy, site unspecified: Secondary | ICD-10-CM | POA: Diagnosis not present

## 2016-01-07 DIAGNOSIS — R2689 Other abnormalities of gait and mobility: Secondary | ICD-10-CM | POA: Diagnosis not present

## 2016-01-07 DIAGNOSIS — M6281 Muscle weakness (generalized): Secondary | ICD-10-CM | POA: Diagnosis not present

## 2016-01-12 DIAGNOSIS — M6281 Muscle weakness (generalized): Secondary | ICD-10-CM | POA: Diagnosis not present

## 2016-01-12 DIAGNOSIS — M545 Low back pain: Secondary | ICD-10-CM | POA: Diagnosis not present

## 2016-01-12 DIAGNOSIS — R2689 Other abnormalities of gait and mobility: Secondary | ICD-10-CM | POA: Diagnosis not present

## 2016-01-12 DIAGNOSIS — M4720 Other spondylosis with radiculopathy, site unspecified: Secondary | ICD-10-CM | POA: Diagnosis not present

## 2016-01-14 DIAGNOSIS — M6281 Muscle weakness (generalized): Secondary | ICD-10-CM | POA: Diagnosis not present

## 2016-01-14 DIAGNOSIS — M545 Low back pain: Secondary | ICD-10-CM | POA: Diagnosis not present

## 2016-01-14 DIAGNOSIS — R2689 Other abnormalities of gait and mobility: Secondary | ICD-10-CM | POA: Diagnosis not present

## 2016-01-14 DIAGNOSIS — M4720 Other spondylosis with radiculopathy, site unspecified: Secondary | ICD-10-CM | POA: Diagnosis not present

## 2016-03-09 DIAGNOSIS — L570 Actinic keratosis: Secondary | ICD-10-CM | POA: Diagnosis not present

## 2016-03-09 DIAGNOSIS — L814 Other melanin hyperpigmentation: Secondary | ICD-10-CM | POA: Diagnosis not present

## 2016-03-09 DIAGNOSIS — D485 Neoplasm of uncertain behavior of skin: Secondary | ICD-10-CM | POA: Diagnosis not present

## 2016-03-09 DIAGNOSIS — L720 Epidermal cyst: Secondary | ICD-10-CM | POA: Diagnosis not present

## 2016-03-09 DIAGNOSIS — Z85828 Personal history of other malignant neoplasm of skin: Secondary | ICD-10-CM | POA: Diagnosis not present

## 2016-03-09 DIAGNOSIS — L821 Other seborrheic keratosis: Secondary | ICD-10-CM | POA: Diagnosis not present

## 2016-03-10 DIAGNOSIS — C50919 Malignant neoplasm of unspecified site of unspecified female breast: Secondary | ICD-10-CM | POA: Diagnosis not present

## 2016-03-25 DIAGNOSIS — R935 Abnormal findings on diagnostic imaging of other abdominal regions, including retroperitoneum: Secondary | ICD-10-CM | POA: Diagnosis not present

## 2016-03-25 DIAGNOSIS — K5730 Diverticulosis of large intestine without perforation or abscess without bleeding: Secondary | ICD-10-CM | POA: Diagnosis not present

## 2016-03-25 DIAGNOSIS — K30 Functional dyspepsia: Secondary | ICD-10-CM | POA: Diagnosis not present

## 2016-04-01 DIAGNOSIS — L570 Actinic keratosis: Secondary | ICD-10-CM | POA: Diagnosis not present

## 2016-04-13 DIAGNOSIS — R935 Abnormal findings on diagnostic imaging of other abdominal regions, including retroperitoneum: Secondary | ICD-10-CM | POA: Diagnosis not present

## 2016-04-13 DIAGNOSIS — K30 Functional dyspepsia: Secondary | ICD-10-CM | POA: Diagnosis not present

## 2016-04-13 DIAGNOSIS — K5730 Diverticulosis of large intestine without perforation or abscess without bleeding: Secondary | ICD-10-CM | POA: Diagnosis not present

## 2016-04-20 DIAGNOSIS — K2950 Unspecified chronic gastritis without bleeding: Secondary | ICD-10-CM | POA: Diagnosis not present

## 2016-04-20 DIAGNOSIS — K449 Diaphragmatic hernia without obstruction or gangrene: Secondary | ICD-10-CM | POA: Diagnosis not present

## 2016-04-20 DIAGNOSIS — K219 Gastro-esophageal reflux disease without esophagitis: Secondary | ICD-10-CM | POA: Diagnosis not present

## 2016-05-13 DIAGNOSIS — Z471 Aftercare following joint replacement surgery: Secondary | ICD-10-CM | POA: Diagnosis not present

## 2016-05-13 DIAGNOSIS — Z96642 Presence of left artificial hip joint: Secondary | ICD-10-CM | POA: Diagnosis not present

## 2016-05-13 DIAGNOSIS — M25562 Pain in left knee: Secondary | ICD-10-CM | POA: Diagnosis not present

## 2016-05-13 DIAGNOSIS — G8929 Other chronic pain: Secondary | ICD-10-CM | POA: Diagnosis not present

## 2016-05-13 DIAGNOSIS — M7062 Trochanteric bursitis, left hip: Secondary | ICD-10-CM | POA: Diagnosis not present

## 2016-06-01 DIAGNOSIS — R935 Abnormal findings on diagnostic imaging of other abdominal regions, including retroperitoneum: Secondary | ICD-10-CM | POA: Diagnosis not present

## 2016-06-01 DIAGNOSIS — K30 Functional dyspepsia: Secondary | ICD-10-CM | POA: Diagnosis not present

## 2016-06-01 DIAGNOSIS — K5730 Diverticulosis of large intestine without perforation or abscess without bleeding: Secondary | ICD-10-CM | POA: Diagnosis not present

## 2016-07-05 DIAGNOSIS — R935 Abnormal findings on diagnostic imaging of other abdominal regions, including retroperitoneum: Secondary | ICD-10-CM | POA: Diagnosis not present

## 2016-07-05 DIAGNOSIS — K219 Gastro-esophageal reflux disease without esophagitis: Secondary | ICD-10-CM | POA: Diagnosis not present

## 2016-07-05 DIAGNOSIS — K5730 Diverticulosis of large intestine without perforation or abscess without bleeding: Secondary | ICD-10-CM | POA: Diagnosis not present

## 2016-07-05 DIAGNOSIS — K635 Polyp of colon: Secondary | ICD-10-CM | POA: Diagnosis not present

## 2016-08-20 DIAGNOSIS — D692 Other nonthrombocytopenic purpura: Secondary | ICD-10-CM | POA: Diagnosis not present

## 2016-08-20 DIAGNOSIS — L820 Inflamed seborrheic keratosis: Secondary | ICD-10-CM | POA: Diagnosis not present

## 2016-08-20 DIAGNOSIS — D2339 Other benign neoplasm of skin of other parts of face: Secondary | ICD-10-CM | POA: Diagnosis not present

## 2016-08-20 DIAGNOSIS — L814 Other melanin hyperpigmentation: Secondary | ICD-10-CM | POA: Diagnosis not present

## 2016-08-20 DIAGNOSIS — L821 Other seborrheic keratosis: Secondary | ICD-10-CM | POA: Diagnosis not present

## 2016-08-20 DIAGNOSIS — D485 Neoplasm of uncertain behavior of skin: Secondary | ICD-10-CM | POA: Diagnosis not present

## 2016-10-15 DIAGNOSIS — M1712 Unilateral primary osteoarthritis, left knee: Secondary | ICD-10-CM | POA: Diagnosis not present

## 2016-11-01 DIAGNOSIS — Z961 Presence of intraocular lens: Secondary | ICD-10-CM | POA: Diagnosis not present

## 2016-11-01 DIAGNOSIS — H52203 Unspecified astigmatism, bilateral: Secondary | ICD-10-CM | POA: Diagnosis not present

## 2016-11-19 DIAGNOSIS — M1712 Unilateral primary osteoarthritis, left knee: Secondary | ICD-10-CM | POA: Diagnosis not present

## 2016-11-26 DIAGNOSIS — M1712 Unilateral primary osteoarthritis, left knee: Secondary | ICD-10-CM | POA: Diagnosis not present

## 2016-12-03 DIAGNOSIS — M1712 Unilateral primary osteoarthritis, left knee: Secondary | ICD-10-CM | POA: Diagnosis not present

## 2016-12-31 DIAGNOSIS — M5416 Radiculopathy, lumbar region: Secondary | ICD-10-CM | POA: Diagnosis not present

## 2016-12-31 DIAGNOSIS — M48061 Spinal stenosis, lumbar region without neurogenic claudication: Secondary | ICD-10-CM | POA: Diagnosis not present

## 2016-12-31 DIAGNOSIS — M5136 Other intervertebral disc degeneration, lumbar region: Secondary | ICD-10-CM | POA: Diagnosis not present

## 2016-12-31 DIAGNOSIS — M4726 Other spondylosis with radiculopathy, lumbar region: Secondary | ICD-10-CM | POA: Diagnosis not present

## 2017-01-21 DIAGNOSIS — M1712 Unilateral primary osteoarthritis, left knee: Secondary | ICD-10-CM | POA: Diagnosis not present

## 2017-02-09 ENCOUNTER — Ambulatory Visit (INDEPENDENT_AMBULATORY_CARE_PROVIDER_SITE_OTHER): Payer: Medicare Other | Admitting: Neurology

## 2017-02-09 ENCOUNTER — Encounter: Payer: Self-pay | Admitting: Neurology

## 2017-02-09 DIAGNOSIS — M545 Low back pain, unspecified: Secondary | ICD-10-CM | POA: Insufficient documentation

## 2017-02-09 DIAGNOSIS — R269 Unspecified abnormalities of gait and mobility: Secondary | ICD-10-CM | POA: Diagnosis not present

## 2017-02-09 DIAGNOSIS — R202 Paresthesia of skin: Secondary | ICD-10-CM | POA: Diagnosis not present

## 2017-02-09 DIAGNOSIS — E559 Vitamin D deficiency, unspecified: Secondary | ICD-10-CM | POA: Diagnosis not present

## 2017-02-09 DIAGNOSIS — R799 Abnormal finding of blood chemistry, unspecified: Secondary | ICD-10-CM | POA: Diagnosis not present

## 2017-02-09 DIAGNOSIS — G8929 Other chronic pain: Secondary | ICD-10-CM | POA: Diagnosis not present

## 2017-02-09 HISTORY — DX: Unspecified abnormalities of gait and mobility: R26.9

## 2017-02-09 HISTORY — DX: Low back pain, unspecified: M54.50

## 2017-02-09 HISTORY — DX: Paresthesia of skin: R20.2

## 2017-02-09 MED ORDER — PREGABALIN 50 MG PO CAPS: 50.0000 mg | ORAL_CAPSULE | Freq: Four times a day (QID) | ORAL | 5 refills | Status: DC | PRN

## 2017-02-09 NOTE — Progress Notes (Signed)
PATIENT: Crystal Lee DOB: January 08, 1934  Chief Complaint  Patient presents with  . Peripheral Neuropathy    Reports burning sensations, pain and weakness in her bilateral legs,  Her symptoms have been present for 2-3 years and cause her difficulty when walking.  Gabapentin did not help.  She has been on hydrocodone and Lyrica in the past which was helpful.  She stopped the medications because the prescribing physician closed his office.  Marland Kitchen PCP    Hollace Kinnier, NP     HISTORICAL  Crystal Lee is 83 years old female, seen in refer by her primary care nurse practitioner Hollace Kinnier for evaluation of paresthesia at bilateral lower extremity.  I reviewed and summarized the referring note, she had a history of hypertension, breast cancer,s/p double mastectomy in 1998, but did not require chemotherapy, only took tamoxifen until 2001, hyperlipidemia, vitamin D deficiency, osteoarthritis, chronic constipation.  She began to notice bilateral plantar feet burning pain since 2015, gradual onset, slowly progressive, now she complains of burning sensation below knee level, getting worse bearing weight, walking for short distance, she also has chronic low back pain, gradually worsening mild unsteady gait,  She has chronic constipation, now with worsening urinary urgency, occasionally incontinence,  She moved from [place] to [place] in May 2018, previously was getting prescription of Lyrica, hydrocodone low dose, which has helped her symptoms, tried gabapentin was not effective  REVIEW OF SYSTEMS: Full 14 system review of systems performed and notable only for fatigue, swelling in legs, hearing loss, loss of vision, shortness of breath, incontinence, constipation, incontinence, feeling hot, joint pain, joint swelling, cramps, achy muscles, numbness, weakness, restless leg, decreased energy  ALLERGIES: No Known Allergies  HOME MEDICATIONS: Current Outpatient  Prescriptions  Medication Sig Dispense Refill  . atorvastatin (LIPITOR) 20 MG tablet Take 20 mg by mouth daily.    Marland Kitchen CALCIUM PO Take 1,200 mg by mouth daily.    Marland Kitchen LINZESS 290 MCG CAPS capsule Take 1 capsule by mouth daily.    Marland Kitchen losartan (COZAAR) 25 MG tablet Take 25 mg by mouth daily.    . nitrofurantoin, macrocrystal-monohydrate, (MACROBID) 100 MG capsule Take 100 mg by mouth daily as needed (prevent uti/ burning/ irritation of vaginal area).     . ondansetron (ZOFRAN-ODT) 4 MG disintegrating tablet as needed.    . pantoprazole (PROTONIX) 40 MG tablet Take 40 mg by mouth daily.    . Vitamin D, Ergocalciferol, (DRISDOL) 50000 units CAPS capsule Take 1 capsule by mouth every 7 (seven) days.      No current facility-administered medications for this visit.     PAST MEDICAL HISTORY: Past Medical History:  Diagnosis Date  . Arthritis   . Cancer (HCC)    HX BREAST CANCER/ SKIN CANCER  . Complication of anesthesia    N/V WITH MORPHINE  . Difficulty sleeping   . Fractured hip ([place])    LEFT - AUG 2016  . GERD (gastroesophageal reflux disease)   . Hyperlipidemia   . Hypertension   . Melanoma ([place])   . Neuropathy   . Nocturia   . Osteopenia   . PONV (postoperative nausea and vomiting)    PT STATES MORPHINE CAUSED N/V  . Rosacea   . Sleep apnea     PAST SURGICAL HISTORY: Past Surgical History:  Procedure Laterality Date  . APPENDECTOMY  1949  . [place]  . BREAST SURGERY    . CHOLECYSTECTOMY    . JOINT REPLACEMENT  RT TOTAL HIP / RT TOTAL KNEE  . MASTECTOMY  1998   BILATERAL   . SKIN CANCER EXCISION  2016   RT SIDE OF NOSE  . TOTAL HIP ARTHROPLASTY  2010   RIGHT  . TOTAL HIP ARTHROPLASTY Left 05/09/2015   Procedure: LEFT TOTAL HIP ARTHROPLASTY ANTERIOR APPROACH;  Surgeon: Gaynelle Arabian, MD;  Location: [facility];  Service: Orthopedics;  Laterality: Left;  . TOTAL KNEE ARTHROPLASTY  2001  . TUMOR REMOVAL  2012   ABDOMINAL - NON CANCEROUS    FAMILY  HISTORY: Family History  Problem Relation Age of Onset  . Other Mother        complications from flu  . Other Father        unsure of cause    SOCIAL HISTORY:  Social History   Social History  . Marital status: Married    Spouse name: N/A  . Number of children: 3  . Years of education: 16 years   Occupational History  . Retired    Social History Main Topics  . Smoking status: Former Smoker    Quit date: 02/03/1976  . Smokeless tobacco: Never Used  . Alcohol use No  . Drug use: No  . Sexual activity: Yes    Birth control/ protection: Post-menopausal   Other Topics Concern  . Not on file   Social History Narrative   Lives at home with husband.   Right-handed.   No caffeine use.     PHYSICAL EXAM   Vitals:   02/09/17 0949  BP: 124/87  Pulse: (!) 104  Weight: 236 lb 12 oz (107.4 kg)  Height: 5\' 6"  (1.676 m)    Not recorded      Body mass index is 38.21 kg/m.  PHYSICAL EXAMNIATION:  Gen: NAD, conversant, well nourised, obese, well groomed                     Cardiovascular: Regular rate rhythm, no peripheral edema, warm, nontender. Eyes: Conjunctivae clear without exudates or hemorrhage Neck: Supple, no carotid bruits. Pulmonary: Clear to auscultation bilaterally   NEUROLOGICAL EXAM:  MENTAL STATUS: Speech:    Speech is normal; fluent and spontaneous with normal comprehension.  Cognition:     Orientation to time, place and person     Normal recent and remote memory     Normal Attention span and concentration     Normal Language, naming, repeating,spontaneous speech     Fund of knowledge   CRANIAL NERVES: CN II: Visual fields are full to confrontation. Fundoscopic exam is normal with sharp discs and no vascular changes. Pupils are round equal and briskly reactive to light. CN III, IV, VI: extraocular movement are normal. No ptosis. CN V: Facial sensation is intact to pinprick in all 3 divisions bilaterally. Corneal responses are intact.  CN  VII: Face is symmetric with normal eye closure and smile. CN VIII: Hearing is normal to rubbing fingers CN IX, X: Palate elevates symmetrically. Phonation is normal. CN XI: Head turning and shoulder shrug are intact CN XII: Tongue is midline with normal movements and no atrophy.  MOTOR: There is no pronator drift of out-stretched arms. Muscle bulk and tone are normal. Muscle strength is normal.  REFLEXES: Reflexes are 2+ and symmetric at the biceps, triceps, knees, and Absent at ankles. Plantar responses are flexor.  SENSORY: Length dependent decreased to light touch, pinprick, and vibratory sensation at ankle level  COORDINATION: Rapid alternating movements and fine finger movements are intact.  There is no dysmetria on finger-to-nose and heel-knee-shin.    GAIT/STANCE: She needs assistance to get up from seated position, wide based, cautious, mildly unsteady Romberg is absent.   DIAGNOSTIC DATA (LABS, IMAGING, TESTING) - I reviewed patient records, labs, notes, testing and imaging myself where available.   ASSESSMENT AND PLAN  Crystal Lee is a 83 y.o. female   Bilateral lower extremity paresthesia, chronic low back pain, gait abnormality  Length dependent sensory changes, absent ankle reflexes  Differentiation diagnosis including peripheral neuropathy, lumbar radiculopathy  Laboratory evaluation for etiology  EMG nerve conduction study  MRI of lumbar    Marcial Pacas, M.D. Ph.D.  [facility] [address] [place], [place] [address] Ph: [phone] Fax: [phone]  CC: Hollace Kinnier, NP

## 2017-02-11 LAB — ANA W/REFLEX IF POSITIVE
Anti JO-1: <0.2 AI (ref 0.0–0.9)
Anti Nuclear Antibody(ANA): POSITIVE — AB
Chromatin Ab SerPl-aCnc: <0.2 AI (ref 0.0–0.9)
ENA SSA (RO) Ab: >8 AI — ABNORMAL HIGH (ref 0.0–0.9)
dsDNA Ab: 3 IU/mL (ref 0–9)

## 2017-02-11 LAB — IMMUNOFIXATION ELECTROPHORESIS
IgA/Immunoglobulin A, Serum: 95 mg/dL (ref 64–422)
IgG (Immunoglobin G), Serum: 1085 mg/dL (ref 700–1600)
IgM (Immunoglobulin M), Srm: 336 mg/dL — ABNORMAL HIGH (ref 26–217)
TOTAL PROTEIN: 6.8 g/dL (ref 6.0–8.5)

## 2017-02-11 LAB — VITAMIN B12: Vitamin B-12: 490 pg/mL (ref 232–1245)

## 2017-02-11 LAB — COMPREHENSIVE METABOLIC PANEL
A/G RATIO: 1.5 (ref 1.2–2.2)
ALBUMIN: 4.1 g/dL (ref 3.5–4.7)
ALT: 27 IU/L (ref 0–32)
AST: 27 IU/L (ref 0–40)
Alkaline Phosphatase: 80 IU/L (ref 39–117)
BILIRUBIN TOTAL: 0.6 mg/dL (ref 0.0–1.2)
BUN / CREAT RATIO: 20 (ref 12–28)
BUN: 13 mg/dL (ref 8–27)
CALCIUM: 9.5 mg/dL (ref 8.7–10.3)
CHLORIDE: 103 mmol/L (ref 96–106)
CO2: 25 mmol/L (ref 20–29)
Creatinine, Ser: 0.66 mg/dL (ref 0.57–1.00)
GFR, EST AFRICAN AMERICAN: 96 mL/min/{1.73_m2} (ref >59)
GFR, EST NON AFRICAN AMERICAN: 83 mL/min/{1.73_m2} (ref >59)
GLOBULIN, TOTAL: 2.7 g/dL (ref 1.5–4.5)
Glucose: 97 mg/dL (ref 65–99)
POTASSIUM: 4.3 mmol/L (ref 3.5–5.2)
SODIUM: 142 mmol/L (ref 134–144)

## 2017-02-11 LAB — CBC WITH DIFFERENTIAL/PLATELET
BASOS ABS: 0 10*3/uL (ref 0.0–0.2)
BASOS: 0 %
EOS (ABSOLUTE): 0.2 10*3/uL (ref 0.0–0.4)
EOS: 3 %
Hematocrit: 41.5 % (ref 34.0–46.6)
Hemoglobin: 13.6 g/dL (ref 11.1–15.9)
IMMATURE GRANS (ABS): 0 10*3/uL (ref 0.0–0.1)
Immature Granulocytes: 0 %
Lymphocytes Absolute: 1.9 10*3/uL (ref 0.7–3.1)
Lymphs: 33 %
MCH: 29.5 pg (ref 26.6–33.0)
MCHC: 32.8 g/dL (ref 31.5–35.7)
MCV: 90 fL (ref 79–97)
MONOCYTES: 9 %
MONOS ABS: 0.5 10*3/uL (ref 0.1–0.9)
Neutrophils Absolute: 3.1 10*3/uL (ref 1.4–7.0)
Neutrophils: 55 %
PLATELETS: 172 10*3/uL (ref 150–379)
RBC: 4.61 x10E6/uL (ref 3.77–5.28)
RDW: 14.4 % (ref 12.3–15.4)
WBC: 5.7 10*3/uL (ref 3.4–10.8)

## 2017-02-11 LAB — FERRITIN: Ferritin: 131 ng/mL (ref 15–150)

## 2017-02-11 LAB — VITAMIN D 25 HYDROXY (VIT D DEFICIENCY, FRACTURES): Vit D, 25-Hydroxy: 36.7 ng/mL (ref 30.0–100.0)

## 2017-02-11 LAB — TSH: TSH: 1.97 u[IU]/mL (ref 0.450–4.500)

## 2017-02-11 LAB — C-REACTIVE PROTEIN: CRP: 2.1 mg/L (ref 0.0–4.9)

## 2017-02-11 LAB — SEDIMENTATION RATE: Sed Rate: 6 mm/hr (ref 0–40)

## 2017-02-11 LAB — HGB A1C W/O EAG: Hgb A1c MFr Bld: 5.4 % (ref 4.8–5.6)

## 2017-02-11 LAB — FOLATE: Folate: 15.8 ng/mL (ref >3.0)

## 2017-02-11 LAB — CK: Total CK: 53 U/L (ref 24–173)

## 2017-02-11 LAB — RPR: RPR: NONREACTIVE

## 2017-02-14 ENCOUNTER — Telehealth: Payer: Self-pay | Admitting: Neurology

## 2017-02-14 NOTE — Telephone Encounter (Signed)
Attempted to reach on listed home number - mailbox full and unable to leave message.  Attempted to reach on mobile number - left message requesting a return call.  Per vo by Dr. Krista Blue, if patient is symptomatic w/ dry eyes/mouth, offer referral to rheumatology.

## 2017-02-14 NOTE — Telephone Encounter (Signed)
Please call patient laboratory evaluation showed evidence of positive ANA, with elevated SSA, above findings are consistent with Sjogren's disease, patient usually presented with dry eye, dry mouth, occasionally peripheral neuropathy.  There is also evidence of elevated IgM, An apparent polyclonal gammopathy: IgM. Kappa and lambda typing  appear increased.  Rest of the laboratory evaluation showed no significant abnormality  I will repeat some of the laboratory evaluation at her next follow-up visit.

## 2017-02-14 NOTE — Telephone Encounter (Signed)
Spoke to patient - she is aware of results and denies symptoms of dry eyes or dry mouth.  Per vo by Dr. Krista Blue, she will discuss further at her follow up.  She has a pending appt on 02/25/17.

## 2017-02-15 ENCOUNTER — Telehealth: Payer: Self-pay | Admitting: Neurology

## 2017-02-15 ENCOUNTER — Other Ambulatory Visit: Payer: Self-pay | Admitting: *Deleted

## 2017-02-15 DIAGNOSIS — R682 Dry mouth, unspecified: Secondary | ICD-10-CM

## 2017-02-15 DIAGNOSIS — R768 Other specified abnormal immunological findings in serum: Secondary | ICD-10-CM

## 2017-02-15 MED ORDER — DULOXETINE HCL 60 MG PO CPEP: 60.0000 mg | ORAL_CAPSULE | Freq: Every day | ORAL | 3 refills | Status: DC

## 2017-02-15 NOTE — Telephone Encounter (Signed)
Patient presented to the office dropping off prior auth forms for her Rx that need to be filled out. I will leave them on Michelle's desk. Also - patient stated she DOES have dry mouth in the evening. Best call back is [phone]

## 2017-02-15 NOTE — Telephone Encounter (Addendum)
Spoke to patient again - states she actually does have problems with dry mouth to the point that is causes her difficulty swallowing.  She would like to proceed with referral to rheumatology.  Referral placed in Epic.  Also, her insurance would not cover Lyrica without her trying gabapentin and duloxetine first.  She has failed gabapentin already.  Per vo by Dr. Krista Blue, offer duloxetine 60mg , daily to replace Lyrica.  Patient is agreeable to this plan.  She would like to pick up the prescription from our office because she is making a trip to [facility] and can get the medication filled for free.  The prescription has been placed up front for pick up.

## 2017-02-15 NOTE — Telephone Encounter (Signed)
See note on 02/14/17 for further information concerning her medication authorization.

## 2017-02-25 ENCOUNTER — Ambulatory Visit (INDEPENDENT_AMBULATORY_CARE_PROVIDER_SITE_OTHER): Payer: Medicare Other | Admitting: Neurology

## 2017-02-25 DIAGNOSIS — R269 Unspecified abnormalities of gait and mobility: Secondary | ICD-10-CM

## 2017-02-25 DIAGNOSIS — M545 Low back pain, unspecified: Secondary | ICD-10-CM

## 2017-02-25 DIAGNOSIS — R202 Paresthesia of skin: Secondary | ICD-10-CM

## 2017-02-25 DIAGNOSIS — G8929 Other chronic pain: Secondary | ICD-10-CM | POA: Diagnosis not present

## 2017-02-25 MED ORDER — DULOXETINE HCL 60 MG PO CPEP: 60.0000 mg | ORAL_CAPSULE | Freq: Every day | ORAL | 11 refills | Status: DC

## 2017-02-25 NOTE — Progress Notes (Signed)
PATIENT: Crystal Lee DOB: [birth year]  No chief complaint on file.    HISTORICAL  Haile Toppins is 83 years old female, seen in refer by her primary care nurse practitioner Hollace Kinnier for evaluation of paresthesia at bilateral lower extremity.  I reviewed and summarized the referring note, she had a history of hypertension, breast cancer,s/p double mastectomy in 1998, but did not require chemotherapy, only took tamoxifen until 2001, hyperlipidemia, vitamin D deficiency, osteoarthritis, chronic constipation.  She began to notice bilateral plantar feet burning pain since 2015, gradual onset, slowly progressive, now she complains of burning sensation below knee level, getting worse bearing weight, walking for short distance, she also has chronic low back pain, gradually worsening mild unsteady gait,  She has chronic constipation, now with worsening urinary urgency, occasionally incontinence,  She moved from [place] to [place] in May 2018, previously was getting prescription of Lyrica, hydrocodone low dose, which has helped her symptoms, tried gabapentin was not effective  Update September 20 eighth 2018: We have reviewed extensive laboratory evaluations, positive ANA, SSA, normal negative ferritin, X7D 5.4, CPK, folic acid, RPR, Z32 992, C-reactive protein, TSH, ferritin level 131, vitamin D 36.7, normal CBC, mild elevated IgM, polyclonal gammopathy, kappa and lambda typing appear increased, normal CMP  Insurance would not cover Lyrica, she has failed gabapentin in the past, and have called in Cymbalta,  MRI is pending, she will have rheumatology appointment soon  EMG nerve conduction study on February 25 2017 showed no significant large fiber peripheral neuropathy, but could not rule out possibility of small fiber neuropathy  REVIEW OF SYSTEMS: Full 14 system review of systems performed and notable only for fatigue, swelling in legs, hearing loss, loss of vision,  shortness of breath, incontinence, constipation, incontinence, feeling hot, joint pain, joint swelling, cramps, achy muscles, numbness, weakness, restless leg, decreased energy  ALLERGIES: No Known Allergies  HOME MEDICATIONS: Current Outpatient Prescriptions  Medication Sig Dispense Refill  . atorvastatin (LIPITOR) 20 MG tablet Take 20 mg by mouth daily.    Marland Kitchen CALCIUM PO Take 1,200 mg by mouth daily.    . DULoxetine (CYMBALTA) 60 MG capsule Take 1 capsule (60 mg total) by mouth daily. 90 capsule 3  . LINZESS 290 MCG CAPS capsule Take 1 capsule by mouth daily.    Marland Kitchen losartan (COZAAR) 25 MG tablet Take 25 mg by mouth daily.    . nitrofurantoin, macrocrystal-monohydrate, (MACROBID) 100 MG capsule Take 100 mg by mouth daily as needed (prevent uti/ burning/ irritation of vaginal area).     . ondansetron (ZOFRAN-ODT) 4 MG disintegrating tablet as needed.    . pantoprazole (PROTONIX) 40 MG tablet Take 40 mg by mouth daily.    . Vitamin D, Ergocalciferol, (DRISDOL) 50000 units CAPS capsule Take 1 capsule by mouth every 7 (seven) days.      No current facility-administered medications for this visit.     PAST MEDICAL HISTORY: Past Medical History:  Diagnosis Date  . Arthritis   . Cancer (HCC)    HX BREAST CANCER/ SKIN CANCER  . Complication of anesthesia    N/V WITH MORPHINE  . Difficulty sleeping   . Fractured hip ([place])    LEFT - AUG 2016  . GERD (gastroesophageal reflux disease)   . Hyperlipidemia   . Hypertension   . Melanoma ([place])   . Neuropathy   . Nocturia   . Osteopenia   . PONV (postoperative nausea and vomiting)    PT STATES MORPHINE CAUSED N/V  .  Rosacea   . Sleep apnea     PAST SURGICAL HISTORY: Past Surgical History:  Procedure Laterality Date  . APPENDECTOMY  1949  . [place]  . BREAST SURGERY    . CHOLECYSTECTOMY    . JOINT REPLACEMENT     RT TOTAL HIP / RT TOTAL KNEE  . MASTECTOMY  1998   BILATERAL   . SKIN CANCER EXCISION  2016   RT SIDE OF  NOSE  . TOTAL HIP ARTHROPLASTY  2010   RIGHT  . TOTAL HIP ARTHROPLASTY Left 05/09/2015   Procedure: LEFT TOTAL HIP ARTHROPLASTY ANTERIOR APPROACH;  Surgeon: Gaynelle Arabian, MD;  Location: [facility];  Service: Orthopedics;  Laterality: Left;  . TOTAL KNEE ARTHROPLASTY  2001  . TUMOR REMOVAL  2012   ABDOMINAL - NON CANCEROUS    FAMILY HISTORY: Family History  Problem Relation Age of Onset  . Other Mother        complications from flu  . Other Father        unsure of cause    SOCIAL HISTORY:  Social History   Social History  . Marital status: Married    Spouse name: N/A  . Number of children: 3  . Years of education: 16 years   Occupational History  . Retired    Social History Main Topics  . Smoking status: Former Smoker    Quit date: 02/03/1976  . Smokeless tobacco: Never Used  . Alcohol use No  . Drug use: No  . Sexual activity: Yes    Birth control/ protection: Post-menopausal   Other Topics Concern  . Not on file   Social History Narrative   Lives at home with husband.   Right-handed.   No caffeine use.     PHYSICAL EXAM   There were no vitals filed for this visit.  Not recorded      There is no height or weight on file to calculate BMI.  PHYSICAL EXAMNIATION:  Gen: NAD, conversant, well nourised, obese, well groomed                     Cardiovascular: Regular rate rhythm, no peripheral edema, warm, nontender. Eyes: Conjunctivae clear without exudates or hemorrhage Neck: Supple, no carotid bruits. Pulmonary: Clear to auscultation bilaterally   NEUROLOGICAL EXAM:  MENTAL STATUS: Speech:    Speech is normal; fluent and spontaneous with normal comprehension.  Cognition:     Orientation to time, place and person     Normal recent and remote memory     Normal Attention span and concentration     Normal Language, naming, repeating,spontaneous speech     Fund of knowledge   CRANIAL NERVES: CN II: Visual fields are full to confrontation. Fundoscopic  exam is normal with sharp discs and no vascular changes. Pupils are round equal and briskly reactive to light. CN III, IV, VI: extraocular movement are normal. No ptosis. CN V: Facial sensation is intact to pinprick in all 3 divisions bilaterally. Corneal responses are intact.  CN VII: Face is symmetric with normal eye closure and smile. CN VIII: Hearing is normal to rubbing fingers CN IX, X: Palate elevates symmetrically. Phonation is normal. CN XI: Head turning and shoulder shrug are intact CN XII: Tongue is midline with normal movements and no atrophy.  MOTOR: There is no pronator drift of out-stretched arms. Muscle bulk and tone are normal. Muscle strength is normal.  REFLEXES: Reflexes are 2+ and symmetric at the biceps, triceps,  knees, and Absent at ankles. Plantar responses are flexor.  SENSORY: Length dependent decreased to light touch, pinprick, and vibratory sensation at ankle level  COORDINATION: Rapid alternating movements and fine finger movements are intact. There is no dysmetria on finger-to-nose and heel-knee-shin.    GAIT/STANCE: She needs assistance to get up from seated position, wide based, cautious, mildly unsteady Romberg is absent.   DIAGNOSTIC DATA (LABS, IMAGING, TESTING) - I reviewed patient records, labs, notes, testing and imaging myself where available.   ASSESSMENT AND PLAN  Zelda Reames is a 83 y.o. female   Bilateral lower extremity paresthesia, chronic low back pain, gait abnormality  Length dependent sensory changes, absent ankle reflexes  No evidence of large fiber peripheral neuropathy on electrodiagnostic study on February 25 2017  Laboratory evaluation showed positive ANA, SSA, suggestive of Sjogren's disease, also elevated IgM,  MRI of lumbar is pending  Will try Cymbalta 60 mg daily,  She will receive rheumatologist,  If she remains symptomatic, will consider skin biopsy for evaluation of possible small fiber neuropathy    Marcial Pacas, M.D. Ph.D.  [facility] [address] [place], [place] [address] Ph: [phone] Fax: [phone]  CC: Hollace Kinnier, NP

## 2017-02-25 NOTE — Procedures (Signed)
Full Name: Crystal Lee Gender: Female MRN #: [id number] Date of Birth: [birth year]    Visit Date: 02/25/2017 08:03 Age: 83 Years 57 Months Old Examining Physician: Marcial Pacas, MD  Referring Physician: Krista Blue, MD History: 83 years old female, with few years history of progressive bilateral feet burning pain,  Summary of the test:  Nerve conduction study: Bilateral superficial peroneal sensory responses showed mildly decreased snap amplitude. Bilateral sural sensory responses were normal. Bilateral peroneal to EDB and tibial motor responses were normal.   Electromyography: Selective needle examinations were performed at bilateral lower extremity muscles and bilateral lumbar sacral paraspinal muscles. There is no significant abnormality found.   Conclusion: This is a normal study. There is no electrodiagnostic evidence of large fiber peripheral neuropathy, or lumbosacral radiculopathy.      ------------------------------- Marcial Pacas, M.D.  [facility] [place], [place] [address] Tel: [phone] Fax: [phone]        [facility]    Nerve / Sites Muscle Latency Ref. Amplitude Ref. Rel Amp Segments Distance Velocity Ref. Area    ms ms mV mV %  cm m/s m/s mVms  L Peroneal - EDB     Ankle EDB 4.6 ?6.5 3.7 ?2.0 100 Ankle - EDB 9   16.1     Fib head EDB 10.9  3.5  94.7 Fib head - Ankle 29 46 ?44 15.7     Pop fossa EDB 12.8  3.7  106 Pop fossa - Fib head 9 47 ?44 15.6         Pop fossa - Ankle      R Peroneal - EDB     Ankle EDB 4.7 ?6.5 3.3 ?2.0 100 Ankle - EDB 9   12.0     Fib head EDB 11.8  3.0  91.9 Fib head - Ankle 30 43 ?44 10.5     Pop fossa EDB 13.8  3.2  104 Pop fossa - Fib head 9 44 ?44 10.9         Pop fossa - Ankle      L Tibial - AH     Ankle AH 3.9 ?5.8 8.5 ?4.0 100 Ankle - AH 9   22.4     Pop fossa AH 13.1  5.6  66.5 Pop fossa - Ankle 37 40 ?41 17.2  R Tibial - AH     Ankle AH 3.6 ?5.8 6.0 ?4.0 100 Ankle - AH 9   18.4     Pop fossa  AH 12.9  2.1  34.3 Pop fossa - Ankle 37 40 ?41 6.6             SNC    Nerve / Sites Rec. Site Peak Lat Ref.  Amp Ref. Segments Distance    ms ms V V  cm  L Sural - Ankle (Calf)     Calf Ankle 3.9 ?4.4 6 ?6 Calf - Ankle 14  R Sural - Ankle (Calf)     Calf Ankle 3.6 ?4.4 7 ?6 Calf - Ankle 14  R Superficial peroneal - Ankle     Lat leg Ankle 3.8 ?4.4 3 ?6 Lat leg - Ankle 14  L Superficial peroneal - Ankle     Lat leg Ankle 4.3 ?4.4 4 ?6 Lat leg - Ankle 14             F  Wave    Nerve F Lat Ref.   ms ms  L Tibial - AH  52.1 ?56.0  R Tibial - AH 55.2 ?56.0         EMG full       EMG Summary Table    Spontaneous MUAP Recruitment  Muscle IA Fib PSW Fasc Other Amp Dur. Poly Pattern  L. Tibialis anterior Normal None None None _______ Normal Normal Normal Normal  L. Tibialis posterior Normal None None None _______ Normal Normal Normal Normal  L. Gastrocnemius (Medial head) Normal None None None _______ Normal Normal Normal Normal  L. Peroneus longus Normal None None None _______ Normal Normal Normal Normal  L. Abductor hallucis Normal None None None _______ Normal Normal Normal Normal  L. Vastus lateralis Normal None None None _______ Normal Normal Normal Normal  R. Tibialis anterior Normal None None None _______ Normal Normal Normal Normal  R. Tibialis posterior Normal None None None _______ Normal Normal Normal Normal  R. Gastrocnemius (Medial head) Normal None None None _______ Normal Normal Normal Normal  R. Vastus lateralis Normal None None None _______ Normal Normal Normal Normal  R. Lumbar paraspinals (mid) Normal None None None _______ Normal Normal Normal Normal  R. Lumbar paraspinals (low) Normal None None None _______ Normal Normal Normal Normal  L. Lumbar paraspinals (mid) Normal None None None _______ Normal Normal Normal Normal  L. Lumbar paraspinals (low) Normal None None None _______ Normal Normal Normal Normal

## 2017-03-01 ENCOUNTER — Ambulatory Visit
Admission: RE | Admit: 2017-03-01 | Discharge: 2017-03-01 | Disposition: A | Discharge status: Home / Self Care | Payer: Medicare Other | Source: Ambulatory Visit | Attending: Neurology | Admitting: Neurology

## 2017-03-01 ENCOUNTER — Other Ambulatory Visit: Payer: Self-pay | Admitting: Neurology

## 2017-03-01 DIAGNOSIS — Z135 Encounter for screening for eye and ear disorders: Secondary | ICD-10-CM | POA: Diagnosis not present

## 2017-03-01 DIAGNOSIS — M545 Low back pain: Secondary | ICD-10-CM

## 2017-03-01 DIAGNOSIS — R202 Paresthesia of skin: Secondary | ICD-10-CM

## 2017-03-01 DIAGNOSIS — Z77018 Contact with and (suspected) exposure to other hazardous metals: Secondary | ICD-10-CM

## 2017-03-01 DIAGNOSIS — R269 Unspecified abnormalities of gait and mobility: Secondary | ICD-10-CM

## 2017-03-01 DIAGNOSIS — G8929 Other chronic pain: Secondary | ICD-10-CM

## 2017-03-03 ENCOUNTER — Telehealth: Payer: Self-pay | Admitting: Neurology

## 2017-03-03 NOTE — Telephone Encounter (Signed)
Please call patient, MRI of the lumbar showed evidence of previous right laminectomy at L4-5, with facet hypertrophy, there was no evidence of spinal stenosis or foraminal narrowing  Evidence of bilateral SI joint arthropathy   IMPRESSION:  Abnormal MRI lumbar spine (without) demonstrating: 1. At L4-5: right laminectomy, facet hypertrophy, with no spinal stenosis or foraminal narrowing. 2. Lower lumbar paraspinal muscle atrophy. 3. SI joint arthropathy.

## 2017-03-03 NOTE — Telephone Encounter (Signed)
Spoke with patient and informed her that her MRI of the lumbar showed evidence of the previous right laminectomy at L4-5. Advised her there was no evidence of spinal stenosis or foraminal narrowing, good news. Advised her there is evidence of bilateral first sacral joint arthropathy or arthritis. She stated "in other words, I have old age changes". This RN confirmed that she does. She verbalized understanding, appreciation of call.

## 2017-03-21 DIAGNOSIS — L820 Inflamed seborrheic keratosis: Secondary | ICD-10-CM | POA: Diagnosis not present

## 2017-03-21 DIAGNOSIS — D1801 Hemangioma of skin and subcutaneous tissue: Secondary | ICD-10-CM | POA: Diagnosis not present

## 2017-03-21 DIAGNOSIS — L821 Other seborrheic keratosis: Secondary | ICD-10-CM | POA: Diagnosis not present

## 2017-03-21 DIAGNOSIS — D225 Melanocytic nevi of trunk: Secondary | ICD-10-CM | POA: Diagnosis not present

## 2017-04-19 DIAGNOSIS — E669 Obesity, unspecified: Secondary | ICD-10-CM | POA: Diagnosis not present

## 2017-04-19 DIAGNOSIS — Z6839 Body mass index (BMI) 39.0-39.9, adult: Secondary | ICD-10-CM | POA: Diagnosis not present

## 2017-04-19 DIAGNOSIS — R768 Other specified abnormal immunological findings in serum: Secondary | ICD-10-CM | POA: Diagnosis not present

## 2017-05-03 ENCOUNTER — Ambulatory Visit (INDEPENDENT_AMBULATORY_CARE_PROVIDER_SITE_OTHER): Payer: Medicare Other | Admitting: Neurology

## 2017-05-03 ENCOUNTER — Encounter: Payer: Self-pay | Admitting: Neurology

## 2017-05-03 VITALS — BP 137/72 | HR 81 | Ht 66.0 in | Wt 239.0 lb

## 2017-05-03 DIAGNOSIS — R202 Paresthesia of skin: Secondary | ICD-10-CM

## 2017-05-03 MED ORDER — PREGABALIN 50 MG PO CAPS: 50.0000 mg | ORAL_CAPSULE | Freq: Three times a day (TID) | ORAL | 5 refills | Status: DC

## 2017-05-03 NOTE — Progress Notes (Signed)
PATIENT: Crystal Lee DOB: May 24, 1934  Chief Complaint  Patient presents with  . Peripheral Neuropathy    She is currently taking duloxetine 60mg  daily with only mild improvement in symptoms. She would like to further review her MRI and NCV/EMG results.     HISTORICAL  Crystal Lee is 83 years old female, seen in refer by her primary care nurse practitioner Hollace Kinnier for evaluation of paresthesia at bilateral lower extremity.  I reviewed and summarized the referring note, she had a history of hypertension, breast cancer,s/p double mastectomy in 1998, but did not require chemotherapy, only took tamoxifen until 2001, hyperlipidemia, vitamin D deficiency, osteoarthritis, chronic constipation.  She began to notice bilateral plantar feet burning pain since 2015, gradual onset, slowly progressive, now she complains of burning sensation below knee level, getting worse bearing weight, walking for short distance, she also has chronic low back pain, gradually worsening mild unsteady gait,  She has chronic constipation, now with worsening urinary urgency, occasionally incontinence,  She moved from [place] to [place] in May 2018, previously was getting prescription of Lyrica, hydrocodone low dose, which has helped her symptoms, tried gabapentin was not effective  Update September 20 eighth 2018: We have reviewed extensive laboratory evaluations, positive ANA, SSA, normal negative ferritin, D7A 5.4, CPK, folic acid, RPR, J28 786, C-reactive protein, TSH, ferritin level 131, vitamin D 36.7, normal CBC, mild elevated IgM, polyclonal gammopathy, kappa and lambda typing appear increased, normal CMP  Insurance would not cover Lyrica, she has failed gabapentin in the past, and have called in Cymbalta,  MRI is pending, she will have rheumatology appointment soon  EMG nerve conduction study on February 25 2017 showed no significant large fiber peripheral neuropathy, but could not  rule out possibility of small fiber neuropathy  UPDATE May 03 2017: She now complains of worsening bilateral feet paresthesia, plantar surface,  left forearm intermittent deep achy pain at volar surface, she denies neck pain, gait difficulty due to feet pain, Cymbalta 60 mg every morning causing drowsiness  We have personally reviewed MRI of lumbar spine, multilevel degenerative changes no significant canal foraminal stenosis.  REVIEW OF SYSTEMS: Full 14 system review of systems performed and notable only for fatigue, swelling in legs, insomnia, frequent awakening, heat intolerance, incontinence of bladder, joint pain, back pain achy muscle walking difficulty  ALLERGIES: No Known Allergies  HOME MEDICATIONS: Current Outpatient Medications  Medication Sig Dispense Refill  . atorvastatin (LIPITOR) 20 MG tablet Take 20 mg by mouth daily.    Marland Kitchen CALCIUM PO Take 1,200 mg by mouth daily.    . DULoxetine (CYMBALTA) 60 MG capsule Take 1 capsule (60 mg total) by mouth daily. 30 capsule 11  . LINZESS 290 MCG CAPS capsule Take 1 capsule by mouth daily.    Marland Kitchen losartan (COZAAR) 25 MG tablet Take 25 mg by mouth daily.    . nitrofurantoin, macrocrystal-monohydrate, (MACROBID) 100 MG capsule Take 100 mg by mouth daily as needed (prevent uti/ burning/ irritation of vaginal area).     . ondansetron (ZOFRAN-ODT) 4 MG disintegrating tablet as needed.    . pantoprazole (PROTONIX) 40 MG tablet Take 40 mg by mouth daily.    . Vitamin D, Ergocalciferol, (DRISDOL) 50000 units CAPS capsule Take 1 capsule by mouth every 7 (seven) days.      No current facility-administered medications for this visit.     PAST MEDICAL HISTORY: Past Medical History:  Diagnosis Date  . Arthritis   . Cancer ([place])  HX BREAST CANCER/ SKIN CANCER  . Complication of anesthesia    N/V WITH MORPHINE  . Difficulty sleeping   . Fractured hip ([place])    LEFT - AUG 2016  . GERD (gastroesophageal reflux disease)   . Hyperlipidemia   .  Hypertension   . Melanoma ([place])   . Neuropathy   . Nocturia   . Osteopenia   . PONV (postoperative nausea and vomiting)    PT STATES MORPHINE CAUSED N/V  . Rosacea   . Sleep apnea     PAST SURGICAL HISTORY: Past Surgical History:  Procedure Laterality Date  . APPENDECTOMY  1949  . [place]  . BREAST SURGERY    . CHOLECYSTECTOMY    . JOINT REPLACEMENT     RT TOTAL HIP / RT TOTAL KNEE  . MASTECTOMY  1998   BILATERAL   . SKIN CANCER EXCISION  2016   RT SIDE OF NOSE  . TOTAL HIP ARTHROPLASTY  2010   RIGHT  . TOTAL HIP ARTHROPLASTY Left 05/09/2015   Procedure: LEFT TOTAL HIP ARTHROPLASTY ANTERIOR APPROACH;  Surgeon: Gaynelle Arabian, MD;  Location: [facility];  Service: Orthopedics;  Laterality: Left;  . TOTAL KNEE ARTHROPLASTY  2001  . TUMOR REMOVAL  2012   ABDOMINAL - NON CANCEROUS    FAMILY HISTORY: Family History  Problem Relation Age of Onset  . Other Mother        complications from flu  . Other Father        unsure of cause    SOCIAL HISTORY:  Social History   Socioeconomic History  . Marital status: Married    Spouse name: Not on file  . Number of children: 3  . Years of education: 16 years  . Highest education level: Not on file  Social Needs  . Financial resource strain: Not on file  . Food insecurity - worry: Not on file  . Food insecurity - inability: Not on file  . Transportation needs - medical: Not on file  . Transportation needs - non-medical: Not on file  Occupational History  . Occupation: Retired  Tobacco Use  . Smoking status: Former Smoker    Last attempt to quit: 02/03/1976    Years since quitting: 41.2  . Smokeless tobacco: Never Used  Substance and Sexual Activity  . Alcohol use: No  . Drug use: No  . Sexual activity: Yes    Birth control/protection: Post-menopausal  Other Topics Concern  . Not on file  Social History Narrative   Lives at home with husband.   Right-handed.   No caffeine use.     PHYSICAL EXAM     Vitals:   05/03/17 1128  BP: 137/72  Pulse: 81  Weight: 239 lb (108.4 kg)  Height: 5\' 6"  (1.676 m)    Not recorded      Body mass index is 38.58 kg/m.  PHYSICAL EXAMNIATION:  Gen: NAD, conversant, well nourised, obese, well groomed                     Cardiovascular: Regular rate rhythm, no peripheral edema, warm, nontender. Eyes: Conjunctivae clear without exudates or hemorrhage Neck: Supple, no carotid bruits. Pulmonary: Clear to auscultation bilaterally   NEUROLOGICAL EXAM:  MENTAL STATUS: Speech:    Speech is normal; fluent and spontaneous with normal comprehension.  Cognition:     Orientation to time, place and person     Normal recent and remote memory     Normal  Attention span and concentration     Normal Language, naming, repeating,spontaneous speech     Fund of knowledge   CRANIAL NERVES: CN II: Visual fields are full to confrontation. Fundoscopic exam is normal with sharp discs and no vascular changes. Pupils are round equal and briskly reactive to light. CN III, IV, VI: extraocular movement are normal. No ptosis. CN V: Facial sensation is intact to pinprick in all 3 divisions bilaterally. Corneal responses are intact.  CN VII: Face is symmetric with normal eye closure and smile. CN VIII: Hearing is normal to rubbing fingers CN IX, X: Palate elevates symmetrically. Phonation is normal. CN XI: Head turning and shoulder shrug are intact CN XII: Tongue is midline with normal movements and no atrophy.  MOTOR: There is no pronator drift of out-stretched arms. Muscle bulk and tone are normal. Muscle strength is normal.  REFLEXES: Reflexes are 2+ and symmetric at the biceps, triceps, knees, and Absent at ankles. Plantar responses are flexor.  SENSORY: Length dependent decreased to light touch, pinprick, and vibratory sensation at ankle level  COORDINATION: Rapid alternating movements and fine finger movements are intact. There is no dysmetria on  finger-to-nose and heel-knee-shin.    GAIT/STANCE: She needs push-up to get up from seated position, wide based, cautious, mildly unsteady Romberg is absent.   DIAGNOSTIC DATA (LABS, IMAGING, TESTING) - I reviewed patient records, labs, notes, testing and imaging myself where available.   ASSESSMENT AND PLAN  Crystal Lee is a 83 y.o. female   Bilateral lower extremity paresthesia, chronic low back pain, gait abnormality  Length dependent sensory changes, absent ankle reflexes  No evidence of large fiber peripheral neuropathy on electrodiagnostic study on February 25 2017  Laboratory evaluation showed positive ANA, SSA, suggestive of Sjogren's disease, she was seen by rheumatologist, no autoimmune disease based on current test,  I ordered a skin biopsy to rule out small fiber neuropathy   Cymbalta 60 mg daily, only provide mild symptoms relief,  She was on titrating dose of gabapentin in the past, without helping her symptoms,  Lyrica 50 mg 3 times daily works better for her  The other options are Trileptal, nortriptyline,   Marcial Pacas, M.D. Ph.D.  [facility] [address], [place], [place] [address] Ph: [phone] Fax: [phone]  CC: Hollace Kinnier, NP

## 2017-05-12 DIAGNOSIS — R109 Unspecified abdominal pain: Secondary | ICD-10-CM | POA: Diagnosis not present

## 2017-05-12 DIAGNOSIS — Q6100 Congenital renal cyst, unspecified: Secondary | ICD-10-CM | POA: Diagnosis not present

## 2017-05-12 DIAGNOSIS — K219 Gastro-esophageal reflux disease without esophagitis: Secondary | ICD-10-CM | POA: Diagnosis not present

## 2017-05-12 DIAGNOSIS — Z86010 Personal history of colonic polyps: Secondary | ICD-10-CM | POA: Diagnosis not present

## 2017-05-12 DIAGNOSIS — K5900 Constipation, unspecified: Secondary | ICD-10-CM | POA: Diagnosis not present

## 2017-05-16 DIAGNOSIS — M65331 Trigger finger, right middle finger: Secondary | ICD-10-CM | POA: Diagnosis not present

## 2017-05-16 DIAGNOSIS — M25541 Pain in joints of right hand: Secondary | ICD-10-CM | POA: Diagnosis not present

## 2017-06-10 DIAGNOSIS — M79641 Pain in right hand: Secondary | ICD-10-CM

## 2017-06-10 DIAGNOSIS — M6530 Trigger finger, unspecified finger: Secondary | ICD-10-CM | POA: Insufficient documentation

## 2017-06-10 DIAGNOSIS — M65341 Trigger finger, right ring finger: Secondary | ICD-10-CM | POA: Diagnosis not present

## 2017-06-10 DIAGNOSIS — M65331 Trigger finger, right middle finger: Secondary | ICD-10-CM | POA: Diagnosis not present

## 2017-06-10 HISTORY — DX: Trigger finger, unspecified finger: M65.30

## 2017-06-10 HISTORY — DX: Pain in right hand: M79.641

## 2017-06-18 ENCOUNTER — Emergency Department (HOSPITAL_BASED_OUTPATIENT_CLINIC_OR_DEPARTMENT_OTHER): Payer: Medicare Other

## 2017-06-18 ENCOUNTER — Other Ambulatory Visit: Payer: Self-pay

## 2017-06-18 ENCOUNTER — Emergency Department (HOSPITAL_BASED_OUTPATIENT_CLINIC_OR_DEPARTMENT_OTHER)
Admission: EM | Admit: 2017-06-18 | Discharge: 2017-06-18 | Disposition: A | Discharge status: Home / Self Care | Payer: Medicare Other | Attending: Emergency Medicine | Admitting: Emergency Medicine

## 2017-06-18 ENCOUNTER — Encounter (HOSPITAL_BASED_OUTPATIENT_CLINIC_OR_DEPARTMENT_OTHER): Payer: Self-pay | Admitting: *Deleted

## 2017-06-18 DIAGNOSIS — Z853 Personal history of malignant neoplasm of breast: Secondary | ICD-10-CM | POA: Diagnosis not present

## 2017-06-18 DIAGNOSIS — R531 Weakness: Secondary | ICD-10-CM | POA: Insufficient documentation

## 2017-06-18 DIAGNOSIS — R590 Localized enlarged lymph nodes: Secondary | ICD-10-CM | POA: Insufficient documentation

## 2017-06-18 DIAGNOSIS — Z87891 Personal history of nicotine dependence: Secondary | ICD-10-CM | POA: Insufficient documentation

## 2017-06-18 DIAGNOSIS — R5383 Other fatigue: Secondary | ICD-10-CM | POA: Insufficient documentation

## 2017-06-18 DIAGNOSIS — R079 Chest pain, unspecified: Secondary | ICD-10-CM | POA: Diagnosis not present

## 2017-06-18 DIAGNOSIS — R072 Precordial pain: Secondary | ICD-10-CM | POA: Insufficient documentation

## 2017-06-18 DIAGNOSIS — I10 Essential (primary) hypertension: Secondary | ICD-10-CM | POA: Insufficient documentation

## 2017-06-18 DIAGNOSIS — Z79899 Other long term (current) drug therapy: Secondary | ICD-10-CM | POA: Insufficient documentation

## 2017-06-18 DIAGNOSIS — Z96651 Presence of right artificial knee joint: Secondary | ICD-10-CM | POA: Diagnosis not present

## 2017-06-18 DIAGNOSIS — R0602 Shortness of breath: Secondary | ICD-10-CM | POA: Diagnosis not present

## 2017-06-18 DIAGNOSIS — Z96641 Presence of right artificial hip joint: Secondary | ICD-10-CM | POA: Diagnosis not present

## 2017-06-18 DIAGNOSIS — Z96642 Presence of left artificial hip joint: Secondary | ICD-10-CM | POA: Insufficient documentation

## 2017-06-18 DIAGNOSIS — I700 Atherosclerosis of aorta: Secondary | ICD-10-CM | POA: Diagnosis not present

## 2017-06-18 LAB — TROPONIN I

## 2017-06-18 LAB — CBC WITH DIFFERENTIAL/PLATELET
BASOS ABS: 0 10*3/uL (ref 0.0–0.1)
Basophils Relative: 0 %
EOS PCT: 6 %
Eosinophils Absolute: 0.3 10*3/uL (ref 0.0–0.7)
HEMATOCRIT: 42.7 % (ref 36.0–46.0)
Hemoglobin: 14.1 g/dL (ref 12.0–15.0)
LYMPHS ABS: 2.2 10*3/uL (ref 0.7–4.0)
LYMPHS PCT: 36 %
MCH: 28.9 pg (ref 26.0–34.0)
MCHC: 33 g/dL (ref 30.0–36.0)
MCV: 87.5 fL (ref 78.0–100.0)
Monocytes Absolute: 0.6 10*3/uL (ref 0.1–1.0)
Monocytes Relative: 9 %
NEUTROS ABS: 3 10*3/uL (ref 1.7–7.7)
Neutrophils Relative %: 49 %
PLATELETS: 160 10*3/uL (ref 150–400)
RBC: 4.88 MIL/uL (ref 3.87–5.11)
RDW: 13.5 % (ref 11.5–15.5)
WBC: 6 10*3/uL (ref 4.0–10.5)

## 2017-06-18 LAB — COMPREHENSIVE METABOLIC PANEL
ALK PHOS: 105 U/L (ref 38–126)
ALT: 17 U/L (ref 14–54)
AST: 25 U/L (ref 15–41)
Albumin: 4 g/dL (ref 3.5–5.0)
Anion gap: 8 (ref 5–15)
BILIRUBIN TOTAL: 0.8 mg/dL (ref 0.3–1.2)
BUN: 14 mg/dL (ref 6–20)
CALCIUM: 9.2 mg/dL (ref 8.9–10.3)
CO2: 27 mmol/L (ref 22–32)
CREATININE: 0.68 mg/dL (ref 0.44–1.00)
Chloride: 103 mmol/L (ref 101–111)
Glucose, Bld: 100 mg/dL — ABNORMAL HIGH (ref 65–99)
Potassium: 4 mmol/L (ref 3.5–5.1)
Sodium: 138 mmol/L (ref 135–145)
TOTAL PROTEIN: 7.8 g/dL (ref 6.5–8.1)

## 2017-06-18 MED ORDER — ONDANSETRON HCL 4 MG/2ML IJ SOLN
4.0000 mg | Freq: Once | INTRAMUSCULAR | Status: AC
  Administered 2017-06-18: 4 mg via INTRAVENOUS
  Filled 2017-06-18: qty 2

## 2017-06-18 MED ORDER — IOPAMIDOL (ISOVUE-370) INJECTION 76%
125.0000 mL | Freq: Once | INTRAVENOUS | Status: AC | PRN
  Administered 2017-06-18: 100 mL via INTRAVENOUS

## 2017-06-18 MED ORDER — SODIUM CHLORIDE 0.9 % IV SOLN
INTRAVENOUS | Status: DC
  Administered 2017-06-18: 09:00:00 via INTRAVENOUS

## 2017-06-18 NOTE — ED Notes (Signed)
ED Provider at bedside. 

## 2017-06-18 NOTE — Discharge Instructions (Signed)
Make an appointment to follow-up with hematology oncology for your past history of breast cancer and further follow-up.  And for further evaluation of the lymph nodes in the chest area.  Today's workup showed no acute cardiac problem or any acute pulmonary problem.  Continue to work on trying to find a primary care doctor in the area.

## 2017-06-18 NOTE — ED Provider Notes (Signed)
[facility] EMERGENCY DEPARTMENT Provider Note   CSN: [id number] Arrival date & time: 06/18/17  0753     History   Chief Complaint Chief Complaint  Patient presents with  . Chest Pain    HPI Crystal Lee is a 84 y.o. female.  Patient with complaint of left-sided chest pain and shortness of breath since yesterday.  Patient though has had some shortness of breath and feeling weak and tired now for several weeks.  Patient's had a past history of bilateral mastectomies due to breast cancer.  But has been cancer free.  Patient recently moved here from the [place] area.  Retired Nature conservation officer.  Has been getting care at [facility] she is been unable to establish a primary care provider here.      Past Medical History:  Diagnosis Date  . Arthritis   . Cancer (HCC)    HX BREAST CANCER/ SKIN CANCER  . Complication of anesthesia    N/V WITH MORPHINE  . Difficulty sleeping   . Fractured hip ([place])    LEFT - AUG 2016  . GERD (gastroesophageal reflux disease)   . Hyperlipidemia   . Hypertension   . Melanoma ([place])   . Neuropathy   . Nocturia   . Osteopenia   . PONV (postoperative nausea and vomiting)    PT STATES MORPHINE CAUSED N/V  . Rosacea   . Sleep apnea     Patient Active Problem List   Diagnosis Date Noted  . Paresthesia 02/09/2017  . Low back pain 02/09/2017  . Gait abnormality 02/09/2017  . OA (osteoarthritis) of hip 05/09/2015    Past Surgical History:  Procedure Laterality Date  . APPENDECTOMY  1949  . [place]  . BREAST SURGERY    . CHOLECYSTECTOMY    . JOINT REPLACEMENT     RT TOTAL HIP / RT TOTAL KNEE  . MASTECTOMY  1998   BILATERAL   . SKIN CANCER EXCISION  2016   RT SIDE OF NOSE  . TOTAL HIP ARTHROPLASTY  2010   RIGHT  . TOTAL HIP ARTHROPLASTY Left 05/09/2015   Procedure: LEFT TOTAL HIP ARTHROPLASTY ANTERIOR APPROACH;  Surgeon: Gaynelle Arabian, MD;  Location: [facility];  Service: Orthopedics;  Laterality: Left;  . TOTAL KNEE ARTHROPLASTY   2001  . TUMOR REMOVAL  2012   ABDOMINAL - NON CANCEROUS    OB History    No data available       Home Medications    Prior to Admission medications   Medication Sig Start Date End Date Taking? Authorizing Provider  atorvastatin (LIPITOR) 20 MG tablet Take 20 mg by mouth daily.    [provider]  CALCIUM PO Take 1,200 mg by mouth daily.    [provider]  DULoxetine (CYMBALTA) 60 MG capsule Take 1 capsule (60 mg total) by mouth daily. 02/25/17   Marcial Pacas, MD  LINZESS 290 MCG CAPS capsule Take 1 capsule by mouth daily. 01/01/17   [provider]  losartan (COZAAR) 25 MG tablet Take 25 mg by mouth daily. 12/26/16   [provider]  nitrofurantoin, macrocrystal-monohydrate, (MACROBID) 100 MG capsule Take 100 mg by mouth daily as needed (prevent uti/ burning/ irritation of vaginal area).     [provider]  ondansetron (ZOFRAN-ODT) 4 MG disintegrating tablet as needed. 01/01/17   [provider]  pantoprazole (PROTONIX) 40 MG tablet Take 40 mg by mouth daily. 12/26/16   [provider]  pregabalin (LYRICA) 50 MG capsule  Take 1 capsule (50 mg total) by mouth 3 (three) times daily. 05/03/17   Marcial Pacas, MD  Vitamin D, Ergocalciferol, (DRISDOL) 50000 units CAPS capsule Take 1 capsule by mouth every 7 (seven) days.  07/05/15   [provider]    Family History Family History  Problem Relation Age of Onset  . Other Mother        complications from flu  . Other Father        unsure of cause    Social History Social History   Tobacco Use  . Smoking status: Former Smoker    Last attempt to quit: 02/03/1976    Years since quitting: 41.4  . Smokeless tobacco: Never Used  Substance Use Topics  . Alcohol use: No  . Drug use: No     Allergies   Morphine and related   Review of Systems Review of Systems  Constitutional: Positive for fatigue. Negative for appetite change and fever.  HENT: Negative for congestion.     Eyes: Negative for redness.  Respiratory: Positive for shortness of breath.   Cardiovascular: Positive for chest pain and leg swelling.  Gastrointestinal: Negative for abdominal pain, diarrhea and vomiting.  Genitourinary: Negative for dysuria.  Musculoskeletal: Negative for back pain.  Skin: Negative for rash.  Neurological: Positive for weakness. Negative for syncope.  Hematological: Does not bruise/bleed easily.  Psychiatric/Behavioral: Negative for confusion.     Physical Exam Updated Vital Signs BP (!) 156/65 (BP Location: Left Arm)   Pulse 88   Temp 98 F (36.7 C)   Resp 18   Ht 1.676 m (5\' 6" )   Wt 106.6 kg (235 lb)   SpO2 100%   BMI 37.93 kg/m   Physical Exam  Constitutional: She is oriented to person, place, and time. She appears well-developed and well-nourished. No distress.  HENT:  Head: Normocephalic and atraumatic.  Mouth/Throat: Oropharynx is clear and moist.  Eyes: Conjunctivae and EOM are normal. Pupils are equal, round, and reactive to light.  Neck: Normal range of motion.  Cardiovascular: Normal rate and normal heart sounds.  Pulmonary/Chest: Effort normal and breath sounds normal. No respiratory distress. She has no wheezes. She exhibits no tenderness.  Abdominal: Soft. Bowel sounds are normal. There is no tenderness.  Musculoskeletal: Normal range of motion. She exhibits edema.  Neurological: She is alert and oriented to person, place, and time. No cranial nerve deficit or sensory deficit. She exhibits normal muscle tone. Coordination normal.  Skin: Skin is warm.  Nursing note and vitals reviewed.    ED Treatments / Results  Labs (all labs ordered are listed, but only abnormal results are displayed) Labs Reviewed  COMPREHENSIVE METABOLIC PANEL - Abnormal; Notable for the following components:      Result Value   Glucose, Bld 100 (*)    All other components within normal limits  CBC WITH DIFFERENTIAL/PLATELET  TROPONIN I  TROPONIN I     EKG  EKG Interpretation  Date/Time:  Saturday June 18 2017 08:05:17 EST Ventricular Rate:  83 PR Interval:    QRS Duration: 97 QT Interval:  397 QTC Calculation: 467 R Axis:   -18 Text Interpretation:  Sinus rhythm Borderline left axis deviation Borderline T wave abnormalities No significant change since last tracing Confirmed by Fredia Sorrow [id number]) on 06/18/2017 8:21:06 AM Also confirmed by Fredia Sorrow [phone]), editor Philomena Doheny [phone])  on 06/18/2017 8:31:15 AM       Radiology Dg Chest 2 View  Result Date: 06/18/2017 CLINICAL DATA:  Left-sided chest pain with shortness of breath EXAM: CHEST  2 VIEW COMPARISON:  08/06/2015 FINDINGS: History of bilateral mastectomy with postoperative axillae. There is no edema, consolidation, effusion, or pneumothorax. Normal heart size and mediastinal contours. IMPRESSION: No evidence of active disease. Electronically Signed   By: Monte Fantasia M.D.   On: 06/18/2017 08:42   Ct Angio Chest Pe W/cm &/or Wo Cm  Result Date: 06/18/2017 CLINICAL DATA:  Left anterior and lateral chest pain with shortness of breath and nausea for 1 week. History of breast cancer. Evaluate for pulmonary embolism. EXAM: CT ANGIOGRAPHY CHEST WITH CONTRAST TECHNIQUE: Multidetector CT imaging of the chest was performed using the standard protocol during bolus administration of intravenous contrast. Multiplanar CT image reconstructions and MIPs were obtained to evaluate the vascular anatomy. CONTRAST:  179mL ISOVUE-370 IOPAMIDOL (ISOVUE-370) INJECTION 76% COMPARISON:  Chest radiographs same date. FINDINGS: Cardiovascular: The pulmonary arteries are well opacified with contrast to the level of the subsegmental branches. There is no evidence of acute pulmonary embolism. There atherosclerosis of the aorta, great vessels and coronary arteries. There is limited opacification of the systemic vessels. No acute vascular findings are seen. The heart size is normal. There is no  pericardial effusion. Mediastinum/Nodes: Patient is status post right mastectomy and bilateral axillary node dissection. There are no enlarged axillary lymph nodes. However, there is an enlarged internal mammary node on the right, measuring 8 mm on image 39 of series 4. There are enlarged right pericardiac nodes measuring 10 mm on image 70 and a 13 mm on image 61. There is an 8 mm left paraesophageal node on image 58.No other enlarged mediastinal or hilar lymph nodes. The thyroid gland, trachea and esophagus demonstrate no significant findings. Lungs/Pleura: No pleural effusion or pneumothorax. Mild dependent atelectasis in both lungs. No confluent airspace opacity or suspicious pulmonary nodule. Upper abdomen: No highly suspicious findings within the visualized upper abdomen. There are mildly prominent lymph nodes in the porta hepatis, including a 14 mm portacaval node on image 99. Musculoskeletal/Chest wall: There is no chest wall mass or suspicious osseous finding. Review of the MIP images confirms the above findings. IMPRESSION: 1. No evidence of acute pulmonary embolism or other acute chest process. 2. Indeterminate prominent right internal mammary, mediastinal and upper abdominal lymph nodes. In this patient with a history of right breast cancer, nodal metastases cannot be excluded. Correlation with prior imaging would be helpful. In the absence of prior imaging, chest CT follow-up in 6 months recommended. 3. Mild dependent atelectasis at both lung bases. 4.  Aortic Atherosclerosis (ICD10-I70.0). Electronically Signed   By: Richardean Sale M.D.   On: 06/18/2017 10:19    Procedures Procedures (including critical care time)  Medications Ordered in ED Medications  0.9 %  sodium chloride infusion ( Intravenous Stopped 06/18/17 1153)  ondansetron (ZOFRAN) injection 4 mg (4 mg Intravenous Given 06/18/17 0909)  iopamidol (ISOVUE-370) 76 % injection 125 mL (100 mLs Intravenous Contrast Given 06/18/17 0941)      Initial Impression / Assessment and Plan / ED Course  I have reviewed the triage vital signs and the nursing notes.  Pertinent labs & imaging results that were available during my care of the patient were reviewed by me and considered in my medical decision making (see chart for details).    Extensive workup for patient's complaints without any acute findings.  CT angios chest no evidence of pulmonary embolus.  Troponins x2 were negative.  CT did show evidence of some mediastinal adenopathy and some  adenopathy in the upper part of the abdomen.  Follow-up for this will be important.  Patient given referral to hematology oncology here locally she needs follow-up for her history of breast cancer either way.  Patient also given some information on trying to down 5 primary care provider.  Patient reassured.  Patient stable for discharge home.   Final Clinical Impressions(s) / ED Diagnoses   Final diagnoses:  Precordial pain  SOB (shortness of breath)  Mediastinal adenopathy    ED Discharge Orders    None       Fredia Sorrow, MD 06/18/17 1558

## 2017-06-18 NOTE — ED Triage Notes (Signed)
Pt c/o left sided chest pain , SOb, nausea x 1 week

## 2017-06-20 ENCOUNTER — Encounter: Payer: Self-pay | Admitting: Hematology & Oncology

## 2017-07-04 ENCOUNTER — Encounter: Payer: Self-pay | Admitting: Hematology & Oncology

## 2017-07-04 ENCOUNTER — Inpatient Hospital Stay: Payer: Medicare Other | Attending: Hematology & Oncology | Admitting: Hematology & Oncology

## 2017-07-04 ENCOUNTER — Other Ambulatory Visit: Payer: Self-pay

## 2017-07-04 ENCOUNTER — Inpatient Hospital Stay: Payer: Medicare Other

## 2017-07-04 DIAGNOSIS — Z853 Personal history of malignant neoplasm of breast: Secondary | ICD-10-CM

## 2017-07-04 DIAGNOSIS — R590 Localized enlarged lymph nodes: Secondary | ICD-10-CM

## 2017-07-04 DIAGNOSIS — Z87891 Personal history of nicotine dependence: Secondary | ICD-10-CM

## 2017-07-04 DIAGNOSIS — T386X5A Adverse effect of antigonadotrophins, antiestrogens, antiandrogens, not elsewhere classified, initial encounter: Secondary | ICD-10-CM

## 2017-07-04 DIAGNOSIS — Z170 Estrogen receptor positive status [ER+]: Secondary | ICD-10-CM

## 2017-07-04 DIAGNOSIS — M818 Other osteoporosis without current pathological fracture: Secondary | ICD-10-CM

## 2017-07-04 DIAGNOSIS — C50911 Malignant neoplasm of unspecified site of right female breast: Secondary | ICD-10-CM

## 2017-07-04 HISTORY — DX: Malignant neoplasm of unspecified site of right female breast: C50.911

## 2017-07-04 HISTORY — DX: Other osteoporosis without current pathological fracture: M81.8

## 2017-07-04 LAB — CMP (CANCER CENTER ONLY)
ALBUMIN: 3.7 g/dL (ref 3.5–5.0)
ALK PHOS: 110 U/L (ref 40–150)
ALT: 19 U/L (ref 0–55)
AST: 28 U/L (ref 5–34)
Anion gap: 9 (ref 3–11)
BUN: 11 mg/dL (ref 7–26)
CALCIUM: 9.4 mg/dL (ref 8.4–10.4)
CHLORIDE: 103 mmol/L (ref 98–109)
CO2: 27 mmol/L (ref 22–29)
CREATININE: 0.77 mg/dL (ref 0.60–1.10)
GFR, Est AFR Am: >60 mL/min (ref >60)
GFR, Estimated: >60 mL/min (ref >60)
GLUCOSE: 108 mg/dL (ref 70–140)
Potassium: 4.3 mmol/L (ref 3.5–5.1)
SODIUM: 139 mmol/L (ref 136–145)
Total Bilirubin: 0.7 mg/dL (ref 0.2–1.2)
Total Protein: 7.4 g/dL (ref 6.4–8.3)

## 2017-07-04 LAB — CBC WITH DIFFERENTIAL (CANCER CENTER ONLY)
BASOS ABS: 0 10*3/uL (ref 0.0–0.1)
BASOS PCT: 0 %
EOS ABS: 0.3 10*3/uL (ref 0.0–0.5)
EOS PCT: 6 %
HCT: 41.4 % (ref 34.8–46.6)
HEMOGLOBIN: 13.4 g/dL (ref 11.6–15.9)
LYMPHS ABS: 2.2 10*3/uL (ref 0.9–3.3)
Lymphocytes Relative: 40 %
MCH: 28.7 pg (ref 26.0–34.0)
MCHC: 32.4 g/dL (ref 32.0–36.0)
MCV: 88.7 fL (ref 81.0–101.0)
Monocytes Absolute: 0.4 10*3/uL (ref 0.1–0.9)
Monocytes Relative: 8 %
NEUTROS PCT: 46 %
Neutro Abs: 2.5 10*3/uL (ref 1.5–6.5)
PLATELETS: 137 10*3/uL — AB (ref 145–400)
RBC: 4.67 MIL/uL (ref 3.70–5.32)
RDW: 13.5 % (ref 11.1–15.7)
WBC: 5.4 10*3/uL (ref 3.9–10.0)

## 2017-07-04 NOTE — Progress Notes (Signed)
Referral MD  Reason for Referral: Remote history of bilateral breast cancer-1998; incidentally found thoracic adenopathy  Chief Complaint  Patient presents with  . New Patient (Initial Visit)  : I was told that my cancer came back.  HPI: Ms. Hoerner is a very charming 84 year old gentleman female.  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  She and he had lived in many places.  They recently moved from [facility].  Apparently, back in 1998, she had breast cancer which was in the right breast.  She elected to undergo bilateral mastectomies.  She has had that they found breast cancer in both breasts.  From the notes from the [place] treatment center, she had stage IA (T1bN0M0) disease.  She had ER positive and PR positive tumor.  HER-2 status was unknown.  She was placed on tamoxifen.  She apparently was on this for less than 5 years.  She said that she was taken off it when she had knee surgery.  She never got put back on it.  She has been doing well.  Of note, she was a former smoker.  She smoked not even 20 pack years.  She stopped back in 1977.  She apparently was having some chest discomfort.  She went to the emergency room locally.  She had a CT angiogram done.  This showed incidentally found small thoracic lymph nodes.  The largest lymph node was in the right paracardiac area and measured 13 mm.  All the lymph nodes measured 8 mm, 10 mm, and there was a 14 mm portacaval node.  Unfortunately, there was no prior CT scan for comparison.  She has had no swollen lymph nodes.  She has had no weight loss or weight gain.  She has had no rashes.  She has had no change in bowel or bladder habits.  She has had no headache.  She was referred to the [place] [facility] for an evaluation.  There is no obvious history of cancer in her family.  Currently, her performance status is [name].   Past Medical History:  Diagnosis Date  . Arthritis   . Cancer (HCC)    HX BREAST CANCER/ SKIN  CANCER  . Complication of anesthesia    N/V WITH MORPHINE  . Difficulty sleeping   . Fractured hip ([place])    LEFT - AUG 2016  . GERD (gastroesophageal reflux disease)   . Hyperlipidemia   . Hypertension   . Melanoma ([place])   . Neuropathy   . Nocturia   . Osteopenia   . 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 PONV (postoperative nausea and vomiting)    PT STATES MORPHINE CAUSED N/V  . Rosacea   . Sleep apnea   . Stage 1 breast cancer, ER+, right ([place]) 07/04/2017  :  Past Surgical History:  Procedure Laterality Date  . APPENDECTOMY  1949  . [place]  . BREAST SURGERY    . CHOLECYSTECTOMY    . JOINT REPLACEMENT     RT TOTAL HIP / RT TOTAL KNEE  . MASTECTOMY  1998   BILATERAL   . SKIN CANCER EXCISION  2016   RT SIDE OF NOSE  . TOTAL HIP ARTHROPLASTY  2010   RIGHT  . TOTAL HIP ARTHROPLASTY Left 05/09/2015   Procedure: LEFT TOTAL HIP ARTHROPLASTY ANTERIOR APPROACH;  Surgeon: Gaynelle Arabian, MD;  Location: [facility];  Service: Orthopedics;  Laterality: Left;  . TOTAL KNEE ARTHROPLASTY  2001  .  TUMOR REMOVAL  2012   ABDOMINAL - NON CANCEROUS  :   Current Outpatient Medications:  .  atorvastatin (LIPITOR) 20 MG tablet, Take 20 mg by mouth daily., Disp: , Rfl:  .  CALCIUM PO, Take 1,200 mg by mouth daily., Disp: , Rfl:  .  Cholecalciferol (VITAMIN D) 2000 units tablet, Take 2,000 Units by mouth., Disp: , Rfl:  .  conjugated estrogens (PREMARIN) vaginal cream, Place 0.625 mg vaginally daily., Disp: , Rfl:  .  LINZESS 290 MCG CAPS capsule, Take 1 capsule by mouth daily., Disp: , Rfl:  .  losartan (COZAAR) 25 MG tablet, Take 25 mg by mouth daily., Disp: , Rfl:  .  mirabegron ER (MYRBETRIQ) 50 MG TB24 tablet, Take 50 mg by mouth daily., Disp: , Rfl:  .  ondansetron (ZOFRAN-ODT) 4 MG disintegrating tablet, as needed., Disp: , Rfl:  .  pantoprazole (PROTONIX) 40 MG tablet, Take 40 mg by mouth daily., Disp: , Rfl:  .  pregabalin (LYRICA) 50 MG capsule, Take 1 capsule  (50 mg total) by mouth 3 (three) times daily., Disp: 90 capsule, Rfl: 5:  :  Allergies  Allergen Reactions  . Morphine And Related Nausea And Vomiting  :  Family History  Problem Relation Age of Onset  . Other Mother        complications from flu  . Other Father        unsure of cause  :  Social History   Socioeconomic History  . Marital status: Married    Spouse name: Not on file  . Number of children: 3  . Years of education: 16 years  . Highest education level: Not on file  Social Needs  . Financial resource strain: Not on file  . Food insecurity - worry: Not on file  . Food insecurity - inability: Not on file  . Transportation needs - medical: Not on file  . Transportation needs - non-medical: Not on file  Occupational History  . Occupation: Retired  Tobacco Use  . Smoking status: Former Smoker    Last attempt to quit: 02/03/1976    Years since quitting: 41.4  . Smokeless tobacco: Never Used  Substance and Sexual Activity  . Alcohol use: No  . Drug use: No  . Sexual activity: Yes    Birth control/protection: Post-menopausal  Other Topics Concern  . Not on file  Social History Narrative   Lives at home with husband.   Right-handed.   No caffeine use.  :  Review of Systems  Constitutional: Negative.   HENT: Negative.   Eyes: Negative.   Respiratory: Negative.   Cardiovascular: Positive for chest pain.  Gastrointestinal: Negative.   Genitourinary: Negative.   Musculoskeletal: Negative.   Skin: Negative.   Neurological: Negative.   Endo/Heme/Allergies: Negative.   Psychiatric/Behavioral: Negative.      Exam: Well-developed and well-nourished white female in no obvious distress.  Vital signs show temperature of 98.1.  Pulse 98.  Blood pressure 122/61.  Weight is 239 pounds.  Head neck exam shows no ocular or oral lesions.  There are no palpable cervical or supraclavicular lymph nodes.  Lungs are clear bilaterally.  Cardiac exam regular rate and rhythm  with no murmurs, rubs or bruits.  Axillary exam shows no bilateral axillary adenopathy.  Chest wall exam shows bilateral mastectomies.  No chest wall nodules are noted.  There is no erythema or warmth on the chest wall.  Abdomen is soft.  She has good bowel sounds.  There is  no fluid wave.  There is no palpable liver or spleen tip.  Back exam shows the lumbar laminectomy scar.  She has no tenderness over the spine, ribs or hips.  Extremities shows no clubbing, cyanosis or edema.  Skin exam shows no rashes, ecchymoses or petechia.  Neurological exam shows no focal neurological deficits. '@IPVITALS'$ @   Recent Labs    07/04/17 1345  WBC 5.4  HCT 41.4  PLT 137*   Recent Labs    07/04/17 1345  NA 139  K 4.3  CL 103  CO2 27  GLUCOSE 108  BUN 11  CALCIUM 9.4    Blood smear review: None  Pathology: None    Assessment and Plan: Ms. Cuoco is a very charming 84 year old white female.  She has a remote history of stage I breast cancer.  I would think that the chance of this recurring would be less than 5%.  I would do the CT scan.  These lymph nodes are certainly quite small.  It is unknown how long she has had these.  It is possible these this might be chronic.  I think the best thing we can do is do a follow-up in 3 months.  I think these nodes are too small for a PET scan right now.  I spent about 45 minutes with Ms. Wickizer.  Over half the time was spent face-to-face.  I reviewed the CT scan with her.  I reviewed her labs with her.  I answered her questions.  I tried to reassure her.  We will get the CT scan the same day that I see her.  If these lymph nodes are larger, that she will clearly need to have undergo a biopsy so we can see what is going on.

## 2017-07-05 LAB — VITAMIN D 25 HYDROXY (VIT D DEFICIENCY, FRACTURES): Vit D, 25-Hydroxy: 34.3 ng/mL (ref 30.0–100.0)

## 2017-07-05 LAB — CANCER ANTIGEN 27.29: CAN 27.29: 16.9 U/mL (ref 0.0–38.6)

## 2017-08-02 ENCOUNTER — Ambulatory Visit (INDEPENDENT_AMBULATORY_CARE_PROVIDER_SITE_OTHER): Payer: Medicare Other | Admitting: Neurology

## 2017-08-02 ENCOUNTER — Encounter (INDEPENDENT_AMBULATORY_CARE_PROVIDER_SITE_OTHER): Payer: Self-pay

## 2017-08-02 ENCOUNTER — Encounter: Payer: Self-pay | Admitting: Neurology

## 2017-08-02 VITALS — BP 143/81 | HR 80 | Ht 66.0 in | Wt 241.0 lb

## 2017-08-02 DIAGNOSIS — M79604 Pain in right leg: Secondary | ICD-10-CM

## 2017-08-02 DIAGNOSIS — M79605 Pain in left leg: Secondary | ICD-10-CM | POA: Diagnosis not present

## 2017-08-02 HISTORY — DX: Pain in right leg: M79.604

## 2017-08-02 MED ORDER — HYDROCODONE-ACETAMINOPHEN 7.5-325 MG PO TABS: 1.0000 | ORAL_TABLET | Freq: Four times a day (QID) | ORAL | 0 refills | Status: DC | PRN

## 2017-08-02 MED ORDER — PREGABALIN 100 MG PO CAPS: 100.0000 mg | ORAL_CAPSULE | Freq: Three times a day (TID) | ORAL | 5 refills | Status: DC

## 2017-08-02 MED ORDER — NORTRIPTYLINE HCL 25 MG PO CAPS: 100.0000 mg | ORAL_CAPSULE | Freq: Every day | ORAL | 6 refills | Status: DC

## 2017-08-02 MED ORDER — NORTRIPTYLINE HCL 25 MG PO CAPS: 100.0000 mg | ORAL_CAPSULE | Freq: Every day | ORAL | 11 refills | Status: DC

## 2017-08-02 NOTE — Progress Notes (Signed)
PATIENT: Crystal Lee DOB: May 26, 1934  Chief Complaint  Patient presents with  . Numbness    Referring/PCP: Hollace Kinnier, NP. Pt is still having pain in her lower legs. Pt's husband had a stroke recently.     HISTORICAL  Crystal Lee is 84 years old female, seen in refer by her primary care nurse practitioner Hollace Kinnier for evaluation of paresthesia at bilateral lower extremity.  I reviewed and summarized the referring note, she had a history of hypertension, breast cancer,s/p double mastectomy in 1998, but did not require chemotherapy, only took tamoxifen until 2001, hyperlipidemia, vitamin D deficiency, osteoarthritis, chronic constipation.  She began to notice bilateral plantar feet burning pain since 2015, gradual onset, slowly progressive, now she complains of burning sensation below knee level, getting worse bearing weight, walking for short distance, she also has chronic low back pain, gradually worsening mild unsteady gait,  She has chronic constipation, now with worsening urinary urgency, occasionally incontinence,  She moved from [place] to [place] in May 2018, previously was getting prescription of Lyrica, hydrocodone low dose, which has helped her symptoms, tried gabapentin was not effective  Update September 20 eighth 2018: We have reviewed extensive laboratory evaluations, positive ANA, SSA, normal negative ferritin, Y8X 5.4, CPK, folic acid, RPR, K48 185, C-reactive protein, TSH, ferritin level 131, vitamin D 36.7, normal CBC, mild elevated IgM, polyclonal gammopathy, kappa and lambda typing appear increased, normal CMP  Insurance would not cover Lyrica, she has failed gabapentin in the past, and have called in Cymbalta,  MRI is pending, she will have rheumatology appointment soon  EMG nerve conduction study on February 25 2017 showed no significant large fiber peripheral neuropathy, but could not rule out possibility of small fiber  neuropathy  UPDATE May 03 2017: She now complains of worsening bilateral feet paresthesia, plantar surface,  left forearm intermittent deep achy pain at volar surface, she denies neck pain, gait difficulty due to feet pain, Cymbalta 60 mg every morning causing drowsiness  We have personally reviewed MRI of lumbar spine, multilevel degenerative changes no significant canal foraminal stenosis.  UPDATE August 02 2017: She complains of bilateral feet, leg pain, " as if my feet are going to break into two", especially after bearing weight,lyding down was helpful.  Her husband suffered a stroke has been in hospital since February 2019, she is depressed, has difficulty sleeping, she took her leftover hydrocodone 7.5 mg every night, which seems to help her, in addition, she is taking Lyrica 50 milligrams 3 times a day, with limited help  Lab evaluation showed normal Vit D 34, CMP, CBC  REVIEW OF SYSTEMS: Full 14 system review of systems performed and notable only for fatigue, swelling in legs, insomnia, frequent awakening, heat intolerance, incontinence of bladder, joint pain, back pain achy muscle walking difficulty  ALLERGIES: Allergies  Allergen Reactions  . Morphine And Related Nausea And Vomiting    HOME MEDICATIONS: Current Outpatient Medications  Medication Sig Dispense Refill  . atorvastatin (LIPITOR) 20 MG tablet Take 20 mg by mouth daily.    Marland Kitchen CALCIUM PO Take 1,200 mg by mouth daily.    . Cholecalciferol (VITAMIN D) 2000 units tablet Take 2,000 Units by mouth.    . conjugated estrogens (PREMARIN) vaginal cream Place 0.625 mg vaginally daily.    Marland Kitchen LINZESS 290 MCG CAPS capsule Take 1 capsule by mouth daily.    Marland Kitchen losartan (COZAAR) 25 MG tablet Take 25 mg by mouth daily.    . mirabegron ER (  MYRBETRIQ) 50 MG TB24 tablet Take 50 mg by mouth daily.    . ondansetron (ZOFRAN-ODT) 4 MG disintegrating tablet as needed.    . pantoprazole (PROTONIX) 40 MG tablet Take 40 mg by mouth daily.    .  pregabalin (LYRICA) 50 MG capsule Take 1 capsule (50 mg total) by mouth 3 (three) times daily. 90 capsule 5   No current facility-administered medications for this visit.     PAST MEDICAL HISTORY: Past Medical History:  Diagnosis Date  . Arthritis   . Cancer (HCC)    HX BREAST CANCER/ SKIN CANCER  . Complication of anesthesia    N/V WITH MORPHINE  . Difficulty sleeping   . Fractured hip ([place])    LEFT - AUG 2016  . GERD (gastroesophageal reflux disease)   . Hyperlipidemia   . Hypertension   . Melanoma ([place])   . Neuropathy   . Nocturia   . Osteopenia   . 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 PONV (postoperative nausea and vomiting)    PT STATES MORPHINE CAUSED N/V  . Rosacea   . Sleep apnea   . Stage 1 breast cancer, ER+, right ([name]) 07/04/2017    PAST SURGICAL HISTORY: Past Surgical History:  Procedure Laterality Date  . APPENDECTOMY  1949  . [place]  . BREAST SURGERY    . CHOLECYSTECTOMY    . JOINT REPLACEMENT     RT TOTAL HIP / RT TOTAL KNEE  . MASTECTOMY  1998   BILATERAL   . SKIN CANCER EXCISION  2016   RT SIDE OF NOSE  . TOTAL HIP ARTHROPLASTY  2010   RIGHT  . TOTAL HIP ARTHROPLASTY Left 05/09/2015   Procedure: LEFT TOTAL HIP ARTHROPLASTY ANTERIOR APPROACH;  Surgeon: Gaynelle Arabian, MD;  Location: [facility];  Service: Orthopedics;  Laterality: Left;  . TOTAL KNEE ARTHROPLASTY  2001  . TUMOR REMOVAL  2012   ABDOMINAL - NON CANCEROUS    FAMILY HISTORY: Family History  Problem Relation Age of Onset  . Other Mother        complications from flu  . Other Father        unsure of cause    SOCIAL HISTORY:  Social History   Socioeconomic History  . Marital status: Married    Spouse name: Not on file  . Number of children: 3  . Years of education: 16 years  . Highest education level: Not on file  Social Needs  . Financial resource strain: Not on file  . Food insecurity - worry: Not on file  . Food insecurity - inability: Not on  file  . Transportation needs - medical: Not on file  . Transportation needs - non-medical: Not on file  Occupational History  . Occupation: Retired  Tobacco Use  . Smoking status: Former Smoker    Last attempt to quit: 02/03/1976    Years since quitting: 41.5  . Smokeless tobacco: Never Used  Substance and Sexual Activity  . Alcohol use: No  . Drug use: No  . Sexual activity: Yes    Birth control/protection: Post-menopausal  Other Topics Concern  . Not on file  Social History Narrative   Lives at home with husband.   Right-handed.   No caffeine use.     PHYSICAL EXAM   Vitals:   08/02/17 1152  BP: (!) 143/81  Pulse: 80  Weight: 241 lb (109.3 kg)  Height: 5\' 6"  (1.676 m)    Not recorded  Body mass index is 38.9 kg/m.  PHYSICAL EXAMNIATION:  Gen: NAD, conversant, well nourised, obese, well groomed                     Cardiovascular: Regular rate rhythm, no peripheral edema, warm, nontender. Eyes: Conjunctivae clear without exudates or hemorrhage Neck: Supple, no carotid bruits. Pulmonary: Clear to auscultation bilaterally  Musculoskeletal: Tenderness of metatarsal joints upon deep palpitation  NEUROLOGICAL EXAM:  MENTAL STATUS: Speech:    Speech is normal; fluent and spontaneous with normal comprehension.  Cognition:     Orientation to time, place and person     Normal recent and remote memory     Normal Attention span and concentration     Normal Language, naming, repeating,spontaneous speech     Fund of knowledge   CRANIAL NERVES: CN II: Visual fields are full to confrontation. Fundoscopic exam is normal with sharp discs and no vascular changes. Pupils are round equal and briskly reactive to light. CN III, IV, VI: extraocular movement are normal. No ptosis. CN V: Facial sensation is intact to pinprick in all 3 divisions bilaterally. Corneal responses are intact.  CN VII: Face is symmetric with normal eye closure and smile. CN VIII: Hearing is normal  to rubbing fingers CN IX, X: Palate elevates symmetrically. Phonation is normal. CN XI: Head turning and shoulder shrug are intact CN XII: Tongue is midline with normal movements and no atrophy.  MOTOR: There is no pronator drift of out-stretched arms. Muscle bulk and tone are normal. Muscle strength is normal.  REFLEXES: Reflexes are 2+ and symmetric at the biceps, triceps, knees, and Absent at ankles. Plantar responses are flexor.  SENSORY: Length dependent decreased to light touch, pinprick, and vibratory sensation at ankle level  COORDINATION: Rapid alternating movements and fine finger movements are intact. There is no dysmetria on finger-to-nose and heel-knee-shin.    GAIT/STANCE: She needs push-up to get up from seated position, wide based, cautious, mildly unsteady Romberg is absent.   DIAGNOSTIC DATA (LABS, IMAGING, TESTING) - I reviewed patient records, labs, notes, testing and imaging myself where available.   ASSESSMENT AND PLAN  Kailei Cowens is a 84 y.o. female   Bilateral lower extremity paresthesia, chronic low back pain, gait abnormality  Length dependent sensory changes, absent ankle reflexes  Likely a combination of small fiber neuropathy, a component of musculoskeletal pain, she does have tenderness of metatarsal joints upon deep palpation,  No evidence of large fiber peripheral neuropathy on electrodiagnostic study on February 25 2017  Laboratory evaluation showed positive ANA, SSA, suggestive of Sjogren's disease, she was seen by rheumatologist, no autoimmune disease based on current test,  Cymbalta 60 mg daily, only provide mild symptoms relief,  She was on titrating dose of gabapentin in the past, without helping her symptoms,  Lyrica 50 mg 3 times daily works better for her, will increase Lyrica to 100 mg 3 times a day,  Add on nortriptyline to 25 mg, titrating slowly to 4 tablets every night  Also given a prescription of Vicodin 7.5/325 mg as needed, 60  tablets, refer her to pain management     Marcial Pacas, M.D. Ph.D.  [facility] [address] [place], [place] [address] Ph: [phone] Fax: [phone]  CC: Hollace Kinnier, NP

## 2017-08-19 DIAGNOSIS — M65331 Trigger finger, right middle finger: Secondary | ICD-10-CM | POA: Diagnosis not present

## 2017-08-19 DIAGNOSIS — M79641 Pain in right hand: Secondary | ICD-10-CM | POA: Diagnosis not present

## 2017-08-19 DIAGNOSIS — M65341 Trigger finger, right ring finger: Secondary | ICD-10-CM | POA: Diagnosis not present

## 2017-09-22 ENCOUNTER — Encounter (HOSPITAL_BASED_OUTPATIENT_CLINIC_OR_DEPARTMENT_OTHER): Payer: Self-pay

## 2017-09-22 ENCOUNTER — Inpatient Hospital Stay: Payer: Medicare Other

## 2017-09-22 ENCOUNTER — Inpatient Hospital Stay: Payer: Medicare Other | Attending: Hematology & Oncology | Admitting: Hematology & Oncology

## 2017-09-22 ENCOUNTER — Ambulatory Visit (HOSPITAL_BASED_OUTPATIENT_CLINIC_OR_DEPARTMENT_OTHER)
Admission: RE | Admit: 2017-09-22 | Discharge: 2017-09-22 | Disposition: A | Discharge status: Home / Self Care | Payer: Medicare Other | Source: Ambulatory Visit | Attending: Hematology & Oncology | Admitting: Hematology & Oncology

## 2017-09-22 VITALS — BP 125/54 | HR 72 | Temp 97.9°F | Resp 18

## 2017-09-22 DIAGNOSIS — Z170 Estrogen receptor positive status [ER+]: Secondary | ICD-10-CM

## 2017-09-22 DIAGNOSIS — I2510 Atherosclerotic heart disease of native coronary artery without angina pectoris: Secondary | ICD-10-CM | POA: Insufficient documentation

## 2017-09-22 DIAGNOSIS — M791 Myalgia, unspecified site: Secondary | ICD-10-CM | POA: Insufficient documentation

## 2017-09-22 DIAGNOSIS — Z9013 Acquired absence of bilateral breasts and nipples: Secondary | ICD-10-CM | POA: Diagnosis not present

## 2017-09-22 DIAGNOSIS — R590 Localized enlarged lymph nodes: Secondary | ICD-10-CM | POA: Insufficient documentation

## 2017-09-22 DIAGNOSIS — M7062 Trochanteric bursitis, left hip: Secondary | ICD-10-CM | POA: Diagnosis not present

## 2017-09-22 DIAGNOSIS — C50911 Malignant neoplasm of unspecified site of right female breast: Secondary | ICD-10-CM

## 2017-09-22 DIAGNOSIS — Z853 Personal history of malignant neoplasm of breast: Secondary | ICD-10-CM | POA: Insufficient documentation

## 2017-09-22 DIAGNOSIS — M25552 Pain in left hip: Secondary | ICD-10-CM | POA: Diagnosis not present

## 2017-09-22 DIAGNOSIS — R918 Other nonspecific abnormal finding of lung field: Secondary | ICD-10-CM | POA: Diagnosis not present

## 2017-09-22 DIAGNOSIS — I700 Atherosclerosis of aorta: Secondary | ICD-10-CM | POA: Diagnosis not present

## 2017-09-22 LAB — CMP (CANCER CENTER ONLY)
ALBUMIN: 3.5 g/dL (ref 3.5–5.0)
ALT: 45 U/L (ref 10–47)
AST: 47 U/L — AB (ref 11–38)
Alkaline Phosphatase: 109 U/L — ABNORMAL HIGH (ref 26–84)
Anion gap: 8 (ref 5–15)
BUN: 20 mg/dL (ref 7–22)
CO2: 32 mmol/L (ref 18–33)
Calcium: 9.9 mg/dL (ref 8.0–10.3)
Chloride: 105 mmol/L (ref 98–108)
Creatinine: 0.6 mg/dL (ref 0.60–1.20)
Glucose, Bld: 96 mg/dL (ref 73–118)
Potassium: 3.9 mmol/L (ref 3.3–4.7)
Sodium: 145 mmol/L (ref 128–145)
TOTAL PROTEIN: 7.4 g/dL (ref 6.4–8.1)
Total Bilirubin: 0.7 mg/dL (ref 0.2–1.6)

## 2017-09-22 LAB — CBC WITH DIFFERENTIAL (CANCER CENTER ONLY)
Basophils Absolute: 0 10*3/uL (ref 0.0–0.1)
Basophils Relative: 0 %
EOS ABS: 0.2 10*3/uL (ref 0.0–0.5)
EOS PCT: 4 %
HCT: 41.6 % (ref 34.8–46.6)
Hemoglobin: 13.5 g/dL (ref 11.6–15.9)
LYMPHS ABS: 1.6 10*3/uL (ref 0.9–3.3)
LYMPHS PCT: 33 %
MCH: 29.2 pg (ref 26.0–34.0)
MCHC: 32.5 g/dL (ref 32.0–36.0)
MCV: 90 fL (ref 81.0–101.0)
MONOS PCT: 8 %
Monocytes Absolute: 0.4 10*3/uL (ref 0.1–0.9)
Neutro Abs: 2.7 10*3/uL (ref 1.5–6.5)
Neutrophils Relative %: 55 %
PLATELETS: 152 10*3/uL (ref 145–400)
RBC: 4.62 MIL/uL (ref 3.70–5.32)
RDW: 13.4 % (ref 11.1–15.7)
WBC Count: 4.9 10*3/uL (ref 3.9–10.0)

## 2017-09-22 MED ORDER — IOPAMIDOL (ISOVUE-300) INJECTION 61%
100.0000 mL | Freq: Once | INTRAVENOUS | Status: AC | PRN
  Administered 2017-09-22: 80 mL via INTRAVENOUS

## 2017-09-22 NOTE — Progress Notes (Signed)
Hematology and Oncology Follow Up Visit  Crystal Lee [id number] 1934/01/01 84 y.o. 09/22/2017   Principle Diagnosis:   Bilateral stage Ia breast cancer-1998; thoracic adenopathy of undetermined significance  Current Therapy:    Surveillance     Interim History:  Crystal Lee is back for her second office visit.  We first saw her back in early February.  At that time, she was doing well.  However, she said that her husband had a stroke about a week or so later.  He was at [facility].  He sounds like he is paralyzed on the left side.  She is having a lot of pain in the left hip.  I think she has had this replaced before.  She does see a orthopedist.  She really needs to go back to see the orthopedist.  She has had no cough.  Is been no chest wall pain.  We did go ahead and do another CT scan of her chest.  This was done today.  Thankfully, the CT scan did not show any change in the thoracic and upper abdominal adenopathy.  Her last CA 27.29 was stable and normal at 17.  Her problem clearly is her left hip.  As although she is going to need to have surgery for this.  Her appetite is doing okay.  She has had no nausea or vomiting.  She has had no bleeding.  There is been no cough.  She has had no fever.  Overall, her performance status is [name].  Medications:  Current Outpatient Medications:  .  atorvastatin (LIPITOR) 20 MG tablet, Take 20 mg by mouth daily., Disp: , Rfl:  .  CALCIUM PO, Take 1,200 mg by mouth daily., Disp: , Rfl:  .  Cholecalciferol (VITAMIN D) 2000 units tablet, Take 2,000 Units by mouth., Disp: , Rfl:  .  conjugated estrogens (PREMARIN) vaginal cream, Place 0.625 mg vaginally daily., Disp: , Rfl:  .  LINZESS 290 MCG CAPS capsule, Take 1 capsule by mouth daily., Disp: , Rfl:  .  losartan (COZAAR) 25 MG tablet, Take 25 mg by mouth daily., Disp: , Rfl:  .  mirabegron ER (MYRBETRIQ) 50 MG TB24 tablet, Take 50 mg by mouth daily., Disp: , Rfl:  .  nortriptyline  (PAMELOR) 25 MG capsule, Take 4 capsules (100 mg total) by mouth at bedtime., Disp: 120 capsule, Rfl: 11 .  ondansetron (ZOFRAN-ODT) 4 MG disintegrating tablet, as needed., Disp: , Rfl:  .  pantoprazole (PROTONIX) 40 MG tablet, Take 40 mg by mouth daily., Disp: , Rfl:  .  pregabalin (LYRICA) 100 MG capsule, Take 1 capsule (100 mg total) by mouth 3 (three) times daily., Disp: 90 capsule, Rfl: 5  Allergies:  Allergies  Allergen Reactions  . Morphine And Related Nausea And Vomiting    Past Medical History, Surgical history, Social history, and Family History were reviewed and updated.  Review of Systems: Review of Systems  Constitutional: Negative.   HENT:  Negative.   Eyes: Negative.   Respiratory: Negative.   Cardiovascular: Negative.   Gastrointestinal: Negative.   Endocrine: Negative.   Genitourinary: Negative.    Musculoskeletal: Positive for arthralgias and myalgias.  Skin: Negative.   Neurological: Negative.   Hematological: Negative.   Psychiatric/Behavioral: Negative.     Physical Exam:  oral temperature is 97.9 F (36.6 C). Her blood pressure is 125/54 (abnormal) and her pulse is 72. Her respiration is 18 and oxygen saturation is 96%.   Wt Readings from Last 3 Encounters:  08/02/17 241 lb (109.3 kg)  07/04/17 239 lb (108.4 kg)  06/18/17 235 lb (106.6 kg)    Physical Exam  Constitutional: She is oriented to person, place, and time.  Her chest wall exam shows bilateral mastectomies.  They are well healed.  She has no erythema or warmth.  She has no bilateral axillary adenopathy.  HENT:  Head: Normocephalic and atraumatic.  Mouth/Throat: Oropharynx is clear and moist.  Eyes: Pupils are equal, round, and reactive to light. EOM are normal.  Neck: Normal range of motion.  Cardiovascular: Normal rate, regular rhythm and normal heart sounds.  Pulmonary/Chest: Effort normal and breath sounds normal.  Abdominal: Soft. Bowel sounds are normal.  Musculoskeletal: Normal  range of motion. She exhibits no edema, tenderness or deformity.  She has decreased range of motion of her hips.  Lymphadenopathy:    She has no cervical adenopathy.  Neurological: She is alert and oriented to person, place, and time.  Skin: Skin is warm and dry. No rash noted. No erythema.  Psychiatric: She has a normal mood and affect. Her behavior is normal. Judgment and thought content normal.  Vitals reviewed.    Lab Results  Component Value Date   WBC 4.9 09/22/2017   HGB 13.5 09/22/2017   HCT 41.6 09/22/2017   MCV 90.0 09/22/2017   PLT 152 09/22/2017     Chemistry      Component Value Date/Time   NA 145 09/22/2017 0926   NA 142 02/09/2017 1039   K 3.9 09/22/2017 0926   CL 105 09/22/2017 0926   CO2 32 09/22/2017 0926   BUN 20 09/22/2017 0926   BUN 13 02/09/2017 1039   CREATININE 0.60 09/22/2017 0926      Component Value Date/Time   CALCIUM 9.9 09/22/2017 0926   ALKPHOS 109 (H) 09/22/2017 0926   AST 47 (H) 09/22/2017 0926   ALT 45 09/22/2017 0926   BILITOT 0.7 09/22/2017 0926         Impression and Plan: Crystal Lee is an 84 year old white female with a past history of bilateral stage Ia breast cancer.  This was 20 years ago.  I just still cannot believe that there is any issues with breast cancer recurrence.  I cannot see anything on her CT scan that looks suspicious.  Nothing is growing.  The lymph nodes are small.  I think we can probably move her out to 3 months.  We will do another CT scan when we see her back.  If she needs surgery for her hips, I do not see a problem with this from an oncologic point of view.  Hopefully, her poor husband will get better.  She needs a family doctor.  We will see about making a referral down to Dr. Janett Billow [place].   [name], MD 4/25/201911:42 AM

## 2017-09-23 LAB — CANCER ANTIGEN 27.29: CA 27.29: 20.3 U/mL (ref 0.0–38.6)

## 2017-09-26 DIAGNOSIS — M65331 Trigger finger, right middle finger: Secondary | ICD-10-CM | POA: Diagnosis not present

## 2017-09-26 DIAGNOSIS — M79641 Pain in right hand: Secondary | ICD-10-CM | POA: Diagnosis not present

## 2017-09-26 DIAGNOSIS — M65341 Trigger finger, right ring finger: Secondary | ICD-10-CM | POA: Diagnosis not present

## 2017-09-30 ENCOUNTER — Telehealth: Payer: Self-pay | Admitting: *Deleted

## 2017-09-30 NOTE — Telephone Encounter (Signed)
Dr Marin Olp requesting that I give patient information about establishing care with a PCP, Dr Lorelei Pont.  [facility], in the same building as this office, confirmed that Dr Lorelei Pont is taking new patients.  Called patient and gave her the name of physician, the office phone number and their location. Patient was receptive to the information and stated that she would call.

## 2017-10-10 ENCOUNTER — Emergency Department (HOSPITAL_BASED_OUTPATIENT_CLINIC_OR_DEPARTMENT_OTHER): Payer: Medicare Other

## 2017-10-10 ENCOUNTER — Encounter (HOSPITAL_BASED_OUTPATIENT_CLINIC_OR_DEPARTMENT_OTHER): Payer: Self-pay | Admitting: Emergency Medicine

## 2017-10-10 ENCOUNTER — Other Ambulatory Visit: Payer: Self-pay

## 2017-10-10 ENCOUNTER — Emergency Department (HOSPITAL_BASED_OUTPATIENT_CLINIC_OR_DEPARTMENT_OTHER)
Admission: EM | Admit: 2017-10-10 | Discharge: 2017-10-10 | Disposition: A | Discharge status: Home / Self Care | Payer: Medicare Other | Attending: Emergency Medicine | Admitting: Emergency Medicine

## 2017-10-10 DIAGNOSIS — Z79899 Other long term (current) drug therapy: Secondary | ICD-10-CM | POA: Diagnosis not present

## 2017-10-10 DIAGNOSIS — M7989 Other specified soft tissue disorders: Secondary | ICD-10-CM | POA: Diagnosis not present

## 2017-10-10 DIAGNOSIS — Z853 Personal history of malignant neoplasm of breast: Secondary | ICD-10-CM | POA: Diagnosis not present

## 2017-10-10 DIAGNOSIS — S99912A Unspecified injury of left ankle, initial encounter: Secondary | ICD-10-CM | POA: Diagnosis not present

## 2017-10-10 DIAGNOSIS — M25572 Pain in left ankle and joints of left foot: Secondary | ICD-10-CM | POA: Diagnosis not present

## 2017-10-10 DIAGNOSIS — E785 Hyperlipidemia, unspecified: Secondary | ICD-10-CM | POA: Diagnosis not present

## 2017-10-10 DIAGNOSIS — M79672 Pain in left foot: Secondary | ICD-10-CM | POA: Diagnosis not present

## 2017-10-10 DIAGNOSIS — I10 Essential (primary) hypertension: Secondary | ICD-10-CM | POA: Diagnosis not present

## 2017-10-10 DIAGNOSIS — Z87891 Personal history of nicotine dependence: Secondary | ICD-10-CM | POA: Diagnosis not present

## 2017-10-10 MED ORDER — TRAMADOL HCL 50 MG PO TABS: 50.0000 mg | ORAL_TABLET | Freq: Two times a day (BID) | ORAL | 0 refills | Status: DC | PRN

## 2017-10-10 MED FILL — traMADol HCL 50 MG TABS: 50 | 4 days supply | Qty: 8 | Fill #0
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
## 2017-10-17 ENCOUNTER — Encounter: Payer: Medicare Other | Attending: Physical Medicine & Rehabilitation | Admitting: Physical Medicine & Rehabilitation

## 2017-10-17 ENCOUNTER — Other Ambulatory Visit: Payer: Self-pay

## 2017-10-17 ENCOUNTER — Encounter: Payer: Self-pay | Admitting: Physical Medicine & Rehabilitation

## 2017-10-17 VITALS — Ht 65.5 in | Wt 241.0 lb

## 2017-10-17 DIAGNOSIS — M79605 Pain in left leg: Secondary | ICD-10-CM | POA: Insufficient documentation

## 2017-10-17 DIAGNOSIS — Z8489 Family history of other specified conditions: Secondary | ICD-10-CM | POA: Insufficient documentation

## 2017-10-17 DIAGNOSIS — Z8673 Personal history of transient ischemic attack (TIA), and cerebral infarction without residual deficits: Secondary | ICD-10-CM | POA: Diagnosis not present

## 2017-10-17 DIAGNOSIS — G8929 Other chronic pain: Secondary | ICD-10-CM | POA: Diagnosis not present

## 2017-10-17 DIAGNOSIS — Z96643 Presence of artificial hip joint, bilateral: Secondary | ICD-10-CM | POA: Diagnosis not present

## 2017-10-17 DIAGNOSIS — E785 Hyperlipidemia, unspecified: Secondary | ICD-10-CM | POA: Insufficient documentation

## 2017-10-17 DIAGNOSIS — Z85820 Personal history of malignant melanoma of skin: Secondary | ICD-10-CM | POA: Diagnosis not present

## 2017-10-17 DIAGNOSIS — Z96651 Presence of right artificial knee joint: Secondary | ICD-10-CM

## 2017-10-17 DIAGNOSIS — M7918 Myalgia, other site: Secondary | ICD-10-CM | POA: Insufficient documentation

## 2017-10-17 DIAGNOSIS — Z853 Personal history of malignant neoplasm of breast: Secondary | ICD-10-CM | POA: Insufficient documentation

## 2017-10-17 DIAGNOSIS — M8580 Other specified disorders of bone density and structure, unspecified site: Secondary | ICD-10-CM | POA: Diagnosis not present

## 2017-10-17 DIAGNOSIS — Z9013 Acquired absence of bilateral breasts and nipples: Secondary | ICD-10-CM | POA: Insufficient documentation

## 2017-10-17 DIAGNOSIS — Z87891 Personal history of nicotine dependence: Secondary | ICD-10-CM | POA: Diagnosis not present

## 2017-10-17 DIAGNOSIS — G4730 Sleep apnea, unspecified: Secondary | ICD-10-CM | POA: Insufficient documentation

## 2017-10-17 DIAGNOSIS — M1712 Unilateral primary osteoarthritis, left knee: Secondary | ICD-10-CM

## 2017-10-17 DIAGNOSIS — Z9889 Other specified postprocedural states: Secondary | ICD-10-CM | POA: Insufficient documentation

## 2017-10-17 DIAGNOSIS — M79604 Pain in right leg: Secondary | ICD-10-CM | POA: Diagnosis not present

## 2017-10-17 DIAGNOSIS — R202 Paresthesia of skin: Secondary | ICD-10-CM | POA: Diagnosis not present

## 2017-10-17 DIAGNOSIS — I10 Essential (primary) hypertension: Secondary | ICD-10-CM | POA: Diagnosis not present

## 2017-10-17 DIAGNOSIS — Z9049 Acquired absence of other specified parts of digestive tract: Secondary | ICD-10-CM | POA: Diagnosis not present

## 2017-10-17 DIAGNOSIS — K219 Gastro-esophageal reflux disease without esophagitis: Secondary | ICD-10-CM | POA: Diagnosis not present

## 2017-10-17 HISTORY — DX: Presence of right artificial knee joint: Z96.651

## 2017-10-17 HISTORY — DX: Unilateral primary osteoarthritis, left knee: M17.12

## 2017-10-17 MED ORDER — DICLOFENAC SODIUM 1 % TD GEL: 1.0000 "application " | Freq: Three times a day (TID) | TRANSDERMAL | 4 refills | Status: DC

## 2017-10-17 MED ORDER — BACLOFEN 10 MG PO TABS: 5.0000 mg | ORAL_TABLET | Freq: Every evening | ORAL | 3 refills | Status: DC | PRN

## 2017-10-17 NOTE — Progress Notes (Signed)
Subjective:    Patient ID: Crystal Lee, female    DOB: 23-Dec-1933, 84 y.o.   MRN: [id number]  HPI   This is an initial visit for Crystal Lee.  She is a pleasant 84 year old female who moved to [place] from [place] in 2018, with a history of breast cancer followed by Dr. Burney Gauze.  Patient had a double mastectomy in 1998 but did not require chemotherapy.  She took tamoxifen up until 2001. She has a remote history of lumbar laminectomy from the 1970's as well.  She has had bilateral plantar foot pain since 2015 which is been slowly progressing to the point where she is having pain up to the knees now.  It has affected her ability to stand as well as walk.  The patient has been followed by Dr. Krista Blue, neurology, who felt that her dysesthesias were related to a combination of small fiber neuropathy musculoskeletal pain.  There is no evidence of large fiber peripheral neuropathy on nerve conduction studies performed in September of last year.  Autoimmune work-up has been negative.  Patient has been on Lyrica which is currently at 100 mg 3 times daily which provided some relief.  Additionally she has used nortriptyline up to 100 mg at nighttime but she stopped due to ineffectiveness.  She also recently was started on hydrocodone 7.5/325 4 for severe breakthrough pain which she took just at night.  Over the last 4 or 5 weeks she has developed cramping at night which may a last a few hours. Rubbing and walking seem to help the cramps.  An MRI from 03/01/2017 of lumbar spine was read as the following:   T12-L1: no spinal stenosis or foraminal narrowing  L1-2: no spinal stenosis or foraminal narrowing  L2-3: facet hypertrophy with no spinal stenosis or foraminal narrowing  L3-4: facet hypertrophy with no spinal stenosis or foraminal narrowing  L4-5: right laminectomy, facet hypertrophy, with no spinal stenosis or foraminal narrowing  L5-S1: no spinal stenosis or foraminal narrowing   She  also has a history of a right knee replacement and end-stage osteoarthritis of the left knee.    Last week her husband, who recently had a stroke, caught her left foot with his w/c which caused a soft tissue injury to her left foot. She has been using a lidoderm patch to help with the pain.  Pain Inventory Average Pain 9 Pain Right Now 8 My pain is sharp, burning and tingling  In the last 24 hours, has pain interfered with the following? General activity 7 Relation with others 0 Enjoyment of life 0 What TIME of day is your pain at its worst? n/a Sleep (in general) Poor  Pain is worse with: walking Pain improves with: rest Relief from Meds: n/a  Mobility walk with assistance use a cane use a walker ability to climb steps?  no do you drive?  yes  Function not employed: date last employed n/a I need assistance with the following:  household duties  Neuro/Psych bladder control problems weakness trouble walking  Prior Studies Any changes since last visit?  yes  Physicians involved in your care Any changes since last visit?  yes   Family History  Problem Relation Age of Onset  . Other Mother        complications from flu  . Other Father        unsure of cause   Social History   Socioeconomic History  . Marital status: Married    Spouse  name: Not on file  . Number of children: 3  . Years of education: 16 years  . Highest education level: Not on file  Occupational History  . Occupation: Retired  Scientific laboratory technician  . Financial resource strain: Not on file  . Food insecurity:    Worry: Not on file    Inability: Not on file  . Transportation needs:    Medical: Not on file    Non-medical: Not on file  Tobacco Use  . Smoking status: Former Smoker    Last attempt to quit: 02/03/1976    Years since quitting: 41.7  . Smokeless tobacco: Never Used  Substance and Sexual Activity  . Alcohol use: No  . Drug use: No  . Sexual activity: Yes    Birth  control/protection: Post-menopausal  Lifestyle  . Physical activity:    Days per week: Not on file    Minutes per session: Not on file  . Stress: Not on file  Relationships  . Social connections:    Talks on phone: Not on file    Gets together: Not on file    Attends religious service: Not on file    Active member of club or organization: Not on file    Attends meetings of clubs or organizations: Not on file    Relationship status: Not on file  Other Topics Concern  . Not on file  Social History Narrative   Lives at home with husband.   Right-handed.   No caffeine use.   Past Surgical History:  Procedure Laterality Date  . APPENDECTOMY  1949  . [place]  . BREAST SURGERY    . CHOLECYSTECTOMY    . JOINT REPLACEMENT     RT TOTAL HIP / RT TOTAL KNEE  . MASTECTOMY  1998   BILATERAL   . SKIN CANCER EXCISION  2016   RT SIDE OF NOSE  . TOTAL HIP ARTHROPLASTY  2010   RIGHT  . TOTAL HIP ARTHROPLASTY Left 05/09/2015   Procedure: LEFT TOTAL HIP ARTHROPLASTY ANTERIOR APPROACH;  Surgeon: Gaynelle Arabian, MD;  Location: [facility];  Service: Orthopedics;  Laterality: Left;  . TOTAL KNEE ARTHROPLASTY  2001  . TUMOR REMOVAL  2012   ABDOMINAL - NON CANCEROUS   Past Medical History:  Diagnosis Date  . Arthritis   . Cancer (HCC)    HX BREAST CANCER/ SKIN CANCER  . Complication of anesthesia    N/V WITH MORPHINE  . Difficulty sleeping   . Fractured hip ([place])    LEFT - AUG 2016  . GERD (gastroesophageal reflux disease)   . Hyperlipidemia   . Hypertension   . Melanoma ([place])   . Neuropathy   . Nocturia   . Osteopenia   . 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 PONV (postoperative nausea and vomiting)    PT STATES MORPHINE CAUSED N/V  . Rosacea   . Sleep apnea   . Stage 1 breast cancer, ER+, right ([place]) 07/04/2017   Ht 5' 5.5" (1.664 m)   Wt 241 lb (109.3 kg)   BMI 39.49 kg/m    Opioid Risk Score:   Fall Risk Score:  `1  Depression screen PHQ 2/9  No  flowsheet data found.  Review of Systems  Constitutional: Negative.   HENT: Negative.   Eyes: Negative.   Respiratory: Negative.   Cardiovascular: Negative.   Gastrointestinal: Positive for constipation.  Endocrine: Negative.   Genitourinary: Negative.   Musculoskeletal: Negative.   Skin: Negative.  Allergic/Immunologic: Negative.   Neurological: Negative.   Hematological: Negative.   Psychiatric/Behavioral: Negative.   All other systems reviewed and are negative.      Objective:   Physical Exam   General: Alert and oriented x 3, No apparent distress.  Obese HEENT: Head is normocephalic, atraumatic, PERRLA, EOMI, sclera anicteric, oral mucosa pink and moist, dentition intact, ext ear canals clear,  Neck: Supple without JVD or lymphadenopathy Heart: Reg rate and rhythm. No murmurs rubs or gallops Chest: CTA bilaterally without wheezes, rales, or rhonchi; no distress Abdomen: Soft, non-tender, non-distended, bowel sounds positive. Extremities: No clubbing, cyanosis. Pulses are 2+ at both the dorsalis pedis and tibialis posterior.  Skin is warm with normal color. Skin: Clean and intact without signs of breakdown Neuro: Pt is cognitively appropriate with normal insight, memory, and awareness. Cranial nerves 2-12 are intact. Sensory exam is notable for decreased light touch below the knees.  There was no discernible difference in sensation from the calfs to the feet.. Reflexes are 2+ in all 4's. Fine motor coordination is intact. No tremors. Motor function is grossly 5/5.  She ambulated with a rolling walker using a slightly wide-based, slow gait.  She appeared stable on her feet. Musculoskeletal: Patient has trace edema in either lower extremity left, more than right.  Mild left ankle pain with passive range of motion and weightbearing today.  She also has noticeable antalgia with weightbearing through the right knee.  Left knee with crepitus during range of motion.  No obvious back  pain with range of motion or ambulation.  Gross fairly easily from the exam table. Psych: Pt's affect is appropriate. Pt is cooperative        Assessment & Plan:  1. Chronic bilateral leg pain.  This appears to be primarily neuropathic in nature but I am fairly certain that there are other components to her pain including her knees.  She is already had one knee replacement.  The left knee as she states is "bone-on-bone".  I suspect she is dealing with some osteoarthritis in her ankles and feet as well.  I see no signs of vascular abnormality.   Plan: 1.  Trial of Voltaren gel to knees legs and feet 3 times daily 2.  Begin trial of baclofen 5 to 10 mg nightly for cramping, spasms and sleep. 3.  Consider trial of narcotic as well if the above prove ineffective. 4.  Continue Lyrica as prescribed. 5.  I will see the patient back in about 1 month's time.  Over 45 minutes was spent today with the patient in direct examination and counseling.

## 2017-10-17 NOTE — Patient Instructions (Signed)
PLEASE FEEL FREE TO CALL OUR OFFICE WITH ANY PROBLEMS OR QUESTIONS ([phone])      

## 2017-11-01 DIAGNOSIS — Z961 Presence of intraocular lens: Secondary | ICD-10-CM | POA: Diagnosis not present

## 2017-11-01 DIAGNOSIS — H52203 Unspecified astigmatism, bilateral: Secondary | ICD-10-CM | POA: Diagnosis not present

## 2017-11-10 DIAGNOSIS — S82392A Other fracture of lower end of left tibia, initial encounter for closed fracture: Secondary | ICD-10-CM | POA: Diagnosis not present

## 2017-11-15 ENCOUNTER — Ambulatory Visit: Payer: Medicare Other | Admitting: Physical Medicine & Rehabilitation

## 2017-11-16 ENCOUNTER — Encounter: Payer: Medicare Other | Admitting: Physical Medicine & Rehabilitation

## 2017-11-17 DIAGNOSIS — T148XXD Other injury of unspecified body region, subsequent encounter: Secondary | ICD-10-CM | POA: Diagnosis not present

## 2017-11-23 ENCOUNTER — Encounter: Payer: Medicare Other | Attending: Physical Medicine & Rehabilitation | Admitting: Physical Medicine & Rehabilitation

## 2017-11-23 ENCOUNTER — Encounter: Payer: Self-pay | Admitting: Physical Medicine & Rehabilitation

## 2017-11-23 VITALS — BP 138/79 | HR 85 | Ht 65.0 in | Wt 245.0 lb

## 2017-11-23 DIAGNOSIS — R202 Paresthesia of skin: Secondary | ICD-10-CM | POA: Diagnosis not present

## 2017-11-23 DIAGNOSIS — M7918 Myalgia, other site: Secondary | ICD-10-CM | POA: Diagnosis not present

## 2017-11-23 DIAGNOSIS — M8580 Other specified disorders of bone density and structure, unspecified site: Secondary | ICD-10-CM | POA: Diagnosis not present

## 2017-11-23 DIAGNOSIS — Z96651 Presence of right artificial knee joint: Secondary | ICD-10-CM | POA: Insufficient documentation

## 2017-11-23 DIAGNOSIS — G4730 Sleep apnea, unspecified: Secondary | ICD-10-CM | POA: Diagnosis not present

## 2017-11-23 DIAGNOSIS — G8929 Other chronic pain: Secondary | ICD-10-CM | POA: Diagnosis not present

## 2017-11-23 DIAGNOSIS — Z9013 Acquired absence of bilateral breasts and nipples: Secondary | ICD-10-CM | POA: Diagnosis not present

## 2017-11-23 DIAGNOSIS — Z87891 Personal history of nicotine dependence: Secondary | ICD-10-CM | POA: Insufficient documentation

## 2017-11-23 DIAGNOSIS — Z96643 Presence of artificial hip joint, bilateral: Secondary | ICD-10-CM | POA: Insufficient documentation

## 2017-11-23 DIAGNOSIS — Z853 Personal history of malignant neoplasm of breast: Secondary | ICD-10-CM | POA: Insufficient documentation

## 2017-11-23 DIAGNOSIS — I10 Essential (primary) hypertension: Secondary | ICD-10-CM | POA: Insufficient documentation

## 2017-11-23 DIAGNOSIS — Z85820 Personal history of malignant melanoma of skin: Secondary | ICD-10-CM | POA: Diagnosis not present

## 2017-11-23 DIAGNOSIS — M1712 Unilateral primary osteoarthritis, left knee: Secondary | ICD-10-CM

## 2017-11-23 DIAGNOSIS — M79605 Pain in left leg: Secondary | ICD-10-CM | POA: Insufficient documentation

## 2017-11-23 DIAGNOSIS — Z9049 Acquired absence of other specified parts of digestive tract: Secondary | ICD-10-CM | POA: Diagnosis not present

## 2017-11-23 DIAGNOSIS — Z9889 Other specified postprocedural states: Secondary | ICD-10-CM | POA: Diagnosis not present

## 2017-11-23 DIAGNOSIS — K219 Gastro-esophageal reflux disease without esophagitis: Secondary | ICD-10-CM | POA: Diagnosis not present

## 2017-11-23 DIAGNOSIS — M79604 Pain in right leg: Secondary | ICD-10-CM | POA: Diagnosis not present

## 2017-11-23 DIAGNOSIS — Z8673 Personal history of transient ischemic attack (TIA), and cerebral infarction without residual deficits: Secondary | ICD-10-CM | POA: Insufficient documentation

## 2017-11-23 DIAGNOSIS — E785 Hyperlipidemia, unspecified: Secondary | ICD-10-CM | POA: Diagnosis not present

## 2017-11-23 DIAGNOSIS — Z8489 Family history of other specified conditions: Secondary | ICD-10-CM | POA: Diagnosis not present

## 2017-11-23 MED ORDER — HYDROCODONE-ACETAMINOPHEN 5-325 MG PO TABS: 1.0000 | ORAL_TABLET | Freq: Three times a day (TID) | ORAL | 0 refills | Status: DC | PRN

## 2017-11-23 NOTE — Patient Instructions (Signed)
PLEASE FEEL FREE TO CALL OUR OFFICE WITH ANY PROBLEMS OR QUESTIONS ([phone])      

## 2017-11-23 NOTE — Progress Notes (Signed)
Subjective:    Patient ID: Crystal Lee, female    DOB: 26-Oct-1933, 84 y.o.   MRN: [id number]  HPI   Crystal Lee is here today in follow-up of her chronic pain.  At her last visit we initiated baclofen 5 to 10 mg as needed for cramps and spasms and she did find this helpful.  She is used the Voltaren gel on her legs but has not found a big change with her nerve pain.  She did find that it helped her left knee somewhat.  Her husband came home from the nursing home and ran over her foot with his wheelchair and apparently she sustained a fracture to her left ankle and now is in a walking boot.  The fracture combined with the stress of her husband being home after his stroke has been difficult for her.  Pain Inventory Average Pain 8 Pain Right Now 8 My pain is burning and aching  In the last 24 hours, has pain interfered with the following? General activity 7 Relation with others 0 Enjoyment of life 7 What TIME of day is your pain at its worst? evening Sleep (in general) Fair  Pain is worse with: walking, bending, sitting, inactivity and standing Pain improves with: rest Relief from Meds: .  Mobility use a cane use a walker do you drive?  yes  Function I need assistance with the following:  household duties  Neuro/Psych No problems in this area  Prior Studies Any changes since last visit?  no  Physicians involved in your care Any changes since last visit?  no   Family History  Problem Relation Age of Onset  . Other Mother        complications from flu  . Other Father        unsure of cause   Social History   Socioeconomic History  . Marital status: Married    Spouse name: Not on file  . Number of children: 3  . Years of education: 16 years  . Highest education level: Not on file  Occupational History  . Occupation: Retired  Scientific laboratory technician  . Financial resource strain: Not on file  . Food insecurity:    Worry: Not on file    Inability: Not on file  .  Transportation needs:    Medical: Not on file    Non-medical: Not on file  Tobacco Use  . Smoking status: Former Smoker    Last attempt to quit: 02/03/1976    Years since quitting: 41.8  . Smokeless tobacco: Never Used  Substance and Sexual Activity  . Alcohol use: No  . Drug use: No  . Sexual activity: Yes    Birth control/protection: Post-menopausal  Lifestyle  . Physical activity:    Days per week: Not on file    Minutes per session: Not on file  . Stress: Not on file  Relationships  . Social connections:    Talks on phone: Not on file    Gets together: Not on file    Attends religious service: Not on file    Active member of club or organization: Not on file    Attends meetings of clubs or organizations: Not on file    Relationship status: Not on file  Other Topics Concern  . Not on file  Social History Narrative   Lives at home with husband.   Right-handed.   No caffeine use.   Past Surgical History:  Procedure Laterality Date  . APPENDECTOMY  1949  .  [place]  . BREAST SURGERY    . CHOLECYSTECTOMY    . JOINT REPLACEMENT     RT TOTAL HIP / RT TOTAL KNEE  . MASTECTOMY  1998   BILATERAL   . SKIN CANCER EXCISION  2016   RT SIDE OF NOSE  . TOTAL HIP ARTHROPLASTY  2010   RIGHT  . TOTAL HIP ARTHROPLASTY Left 05/09/2015   Procedure: LEFT TOTAL HIP ARTHROPLASTY ANTERIOR APPROACH;  Surgeon: Crystal Arabian, MD;  Location: [facility];  Service: Orthopedics;  Laterality: Left;  . TOTAL KNEE ARTHROPLASTY  2001  . TUMOR REMOVAL  2012   ABDOMINAL - NON CANCEROUS   Past Medical History:  Diagnosis Date  . Arthritis   . Cancer (HCC)    HX BREAST CANCER/ SKIN CANCER  . Complication of anesthesia    N/V WITH MORPHINE  . Difficulty sleeping   . Fractured hip ([place])    LEFT - AUG 2016  . GERD (gastroesophageal reflux disease)   . Hyperlipidemia   . Hypertension   . Melanoma ([place])   . Neuropathy   . Nocturia   . Osteopenia   . Osteoporosis due to aromatase  inhibitor 07/04/2017  . PONV (postoperative nausea and vomiting)    PT STATES MORPHINE CAUSED N/V  . Rosacea   . Sleep apnea   . Stage 1 breast cancer, ER+, right ([place]) 07/04/2017   There were no vitals taken for this visit.  Opioid Risk Score:   Fall Risk Score:  `1  Depression screen PHQ 2/9  Depression screen PHQ 2/9 10/17/2017  Decreased Interest 0  Down, Depressed, Hopeless 0  PHQ - 2 Score 0  Altered sleeping 3  Tired, decreased energy 3  Change in appetite 0  Feeling bad or failure about yourself  0  Trouble concentrating 0  Moving slowly or fidgety/restless 0  Suicidal thoughts 0  PHQ-9 Score 6  Difficult doing work/chores Somewhat difficult     Review of Systems  Constitutional: Negative.   HENT: Negative.   Eyes: Negative.   Respiratory: Negative.   Cardiovascular: Positive for leg swelling.  Gastrointestinal: Positive for constipation.  Endocrine: Negative.   Genitourinary: Negative.   Musculoskeletal: Positive for arthralgias, gait problem and myalgias.  Skin: Negative.   Allergic/Immunologic: Negative.   Hematological: Negative.   Psychiatric/Behavioral: Negative.   All other systems reviewed and are negative.      Objective:   Physical Exam  General: No acute distress HEENT: EOMI, oral membranes moist Cards: reg rate  Chest: normal effort Abdomen: Soft, NT, ND Skin: dry, intact Extremities: no edema Neuro:  Decreased light touch from the toes to the calfs.  Motor exam eventually intact except for pain inhibition.  Cognitively appropriate. Musculoskeletal:  Ongoing antalgia in the left knee.  Patient with walking boot on left foot as well today.  Did not remove the boot. Psych:  Affect is appropriate        Assessment & Plan:  1. Chronic bilateral leg pain.  This appears to be primarily neuropathic in nature but I am fairly certain that there are other components to her pain including her knees.  She is already had one knee replacement.  The  left knee as she states is "bone-on-bone".  I suspect she is dealing with some osteoarthritis in her ankles and feet as well.  I see no signs of vascular abnormality. 2. Recent left foot fracture?  Plan: 1.  Continue Voltaren gel to knees legs and feet 3 times  daily 2.  Continue 10 mg nightly for cramping, spasms and sleep. 3.    Begin hydrocodone 5/325 one every 8 hours as needed #90.  CSA was signed today.  She has been on hydrocodone before and tolerated well.  I told her that we will attempt to keep this medication at a minimum moving forward. 4.  Continue Lyrica as prescribed.  Consider slight titration upward 5.    Nurse practitioner will see the patient back in about 1 month's time.    15 minutes was spent today with the patient in counseling and direct contact.

## 2017-11-30 DIAGNOSIS — S82392D Other fracture of lower end of left tibia, subsequent encounter for closed fracture with routine healing: Secondary | ICD-10-CM | POA: Diagnosis not present

## 2017-12-13 ENCOUNTER — Ambulatory Visit: Payer: Medicare Other | Admitting: Physical Medicine & Rehabilitation

## 2017-12-13 ENCOUNTER — Encounter

## 2017-12-14 DIAGNOSIS — S82392D Other fracture of lower end of left tibia, subsequent encounter for closed fracture with routine healing: Secondary | ICD-10-CM | POA: Diagnosis not present

## 2017-12-20 ENCOUNTER — Ambulatory Visit (HOSPITAL_BASED_OUTPATIENT_CLINIC_OR_DEPARTMENT_OTHER)
Admission: RE | Admit: 2017-12-20 | Discharge: 2017-12-20 | Disposition: A | Discharge status: Home / Self Care | Payer: Medicare Other | Source: Ambulatory Visit | Attending: Hematology & Oncology | Admitting: Hematology & Oncology

## 2017-12-20 ENCOUNTER — Encounter: Payer: Self-pay | Admitting: Family

## 2017-12-20 ENCOUNTER — Inpatient Hospital Stay: Payer: Medicare Other | Attending: Hematology & Oncology | Admitting: Family

## 2017-12-20 ENCOUNTER — Encounter (HOSPITAL_BASED_OUTPATIENT_CLINIC_OR_DEPARTMENT_OTHER): Payer: Self-pay

## 2017-12-20 ENCOUNTER — Inpatient Hospital Stay: Payer: Medicare Other

## 2017-12-20 ENCOUNTER — Other Ambulatory Visit: Payer: Self-pay

## 2017-12-20 VITALS — BP 147/51 | HR 74 | Temp 97.8°F | Resp 18 | Wt 238.8 lb

## 2017-12-20 DIAGNOSIS — C50911 Malignant neoplasm of unspecified site of right female breast: Secondary | ICD-10-CM

## 2017-12-20 DIAGNOSIS — I700 Atherosclerosis of aorta: Secondary | ICD-10-CM | POA: Insufficient documentation

## 2017-12-20 DIAGNOSIS — R590 Localized enlarged lymph nodes: Secondary | ICD-10-CM

## 2017-12-20 DIAGNOSIS — Z170 Estrogen receptor positive status [ER+]: Secondary | ICD-10-CM

## 2017-12-20 DIAGNOSIS — Z853 Personal history of malignant neoplasm of breast: Secondary | ICD-10-CM

## 2017-12-20 DIAGNOSIS — I2510 Atherosclerotic heart disease of native coronary artery without angina pectoris: Secondary | ICD-10-CM | POA: Insufficient documentation

## 2017-12-20 LAB — CMP (CANCER CENTER ONLY)
ALK PHOS: 108 U/L — AB (ref 26–84)
ALT: 61 U/L — AB (ref 10–47)
AST: 69 U/L — ABNORMAL HIGH (ref 11–38)
Albumin: 3.7 g/dL (ref 3.5–5.0)
Anion gap: 5 (ref 5–15)
BILIRUBIN TOTAL: 0.7 mg/dL (ref 0.2–1.6)
BUN: 15 mg/dL (ref 7–22)
CO2: 31 mmol/L (ref 18–33)
Calcium: 9.3 mg/dL (ref 8.0–10.3)
Chloride: 104 mmol/L (ref 98–108)
Creatinine: 0.7 mg/dL (ref 0.60–1.20)
Glucose, Bld: 105 mg/dL (ref 73–118)
POTASSIUM: 4.3 mmol/L (ref 3.3–4.7)
Sodium: 140 mmol/L (ref 128–145)
TOTAL PROTEIN: 7.4 g/dL (ref 6.4–8.1)

## 2017-12-20 LAB — CBC WITH DIFFERENTIAL (CANCER CENTER ONLY)
Basophils Absolute: 0 10*3/uL (ref 0.0–0.1)
Basophils Relative: 0 %
Eosinophils Absolute: 0.2 10*3/uL (ref 0.0–0.5)
Eosinophils Relative: 3 %
HEMATOCRIT: 42.8 % (ref 34.8–46.6)
Hemoglobin: 13.7 g/dL (ref 11.6–15.9)
LYMPHS ABS: 1.6 10*3/uL (ref 0.9–3.3)
LYMPHS PCT: 34 %
MCH: 28.7 pg (ref 26.0–34.0)
MCHC: 32 g/dL (ref 32.0–36.0)
MCV: 89.7 fL (ref 81.0–101.0)
MONOS PCT: 8 %
Monocytes Absolute: 0.4 10*3/uL (ref 0.1–0.9)
NEUTROS ABS: 2.5 10*3/uL (ref 1.5–6.5)
NEUTROS PCT: 55 %
Platelet Count: 137 10*3/uL — ABNORMAL LOW (ref 145–400)
RBC: 4.77 MIL/uL (ref 3.70–5.32)
RDW: 13.5 % (ref 11.1–15.7)
WBC Count: 4.6 10*3/uL (ref 3.9–10.0)

## 2017-12-20 MED ORDER — IOPAMIDOL (ISOVUE-300) INJECTION 61%
100.0000 mL | Freq: Once | INTRAVENOUS | Status: AC | PRN
  Administered 2017-12-20: 100 mL via INTRAVENOUS

## 2017-12-21 LAB — CANCER ANTIGEN 27.29: CA 27.29: 21.9 U/mL (ref 0.0–38.6)

## 2017-12-21 NOTE — Progress Notes (Signed)
Hematology and Oncology Follow Up Visit  Crystal Lee [id number] 26-Jun-1933 84 y.o. 12/21/2017   Principle Diagnosis:  Bilateral stage Ia breast cancer-1998; thoracic adenopathy of undetermined significance  Current Therapy:   Observation   Interim History:  Crystal Lee is here today for follow-up. CT today showed stable lower thoracic and upper abdominal denopathy as well as possible mild cirrhosis.  CA 27.29 is 21.9. Patient continues to do self breast checks at home and has not noted any changes.  No fever, chills, n/v, cough, rash, dizziness, SOB, chest pain, palpitations, abdominal pain or changes in bowel or bladder habits.  She still has some intermittent constipation and takes stool softeners as needed.  No swelling in her extremities.  She states that she has seen Dr. Ellene Route with ortho in the past and plans to follow-up with them regarding her back pain and numbness and tingling in her hands and feet. Her husband is in poor health and she has to do a lot of lifting and pulling to help him move. She thinks this may have hurt something in her back.  She states that with her husbands poor health she has gained a lot of weight due to stress eating.  She is staying well hydrated.  She has a new patient appointment with Dr. Edilia Bo in September.   ECOG Performance Status: 1 - Symptomatic but completely ambulatory  Medications:  Allergies as of 12/20/2017      Reactions   Morphine And Related Nausea And Vomiting      Medication List        Accurate as of 12/20/17 11:59 PM. Always use your most recent med list.          atorvastatin 20 MG tablet Commonly known as:  LIPITOR Take 20 mg by mouth daily.   baclofen 10 MG tablet Commonly known as:  LIORESAL Take 0.5-1 tablets (5-10 mg total) by mouth at bedtime as needed for muscle spasms.   CALCIUM PO Take 1,200 mg by mouth daily.   diclofenac sodium 1 % Gel Commonly known as:  VOLTAREN Apply 1 application topically 3  (three) times daily. To feet, ankles, and knees.   HYDROcodone-acetaminophen 5-325 MG tablet Commonly known as:  NORCO/VICODIN Take 1 tablet by mouth every 8 (eight) hours as needed for moderate pain.   LINZESS 290 MCG Caps capsule Generic drug:  linaclotide Take 1 capsule by mouth daily.   losartan 25 MG tablet Commonly known as:  COZAAR Take 25 mg by mouth daily.   ondansetron 4 MG disintegrating tablet Commonly known as:  ZOFRAN-ODT as needed.   pantoprazole 40 MG tablet Commonly known as:  PROTONIX Take 40 mg by mouth daily.   pregabalin 100 MG capsule Commonly known as:  LYRICA Take 1 capsule (100 mg total) by mouth 3 (three) times daily.   PREMARIN vaginal cream Generic drug:  conjugated estrogens Place 0.625 mg vaginally daily.   Vitamin D 2000 units tablet Take 2,000 Units by mouth.       Allergies:  Allergies  Allergen Reactions  . Morphine And Related Nausea And Vomiting    Past Medical History, Surgical history, Social history, and Family History were reviewed and updated.  Review of Systems: All other 10 point review of systems is negative.   Physical Exam:  weight is 238 lb 12.8 oz (108.3 kg). Her oral temperature is 97.8 F (36.6 C). Her blood pressure is 147/51 (abnormal) and her pulse is 74. Her respiration is 18 and oxygen saturation is  0% (abnormal).   Wt Readings from Last 3 Encounters:  12/20/17 238 lb 12.8 oz (108.3 kg)  11/23/17 245 lb (111.1 kg)  10/17/17 241 lb (109.3 kg)    Ocular: Sclerae unicteric, pupils equal, round and reactive to light Ear-nose-throat: Oropharynx clear, dentition fair Lymphatic: No cervical, supraclavicular or axillary adenopathy Lungs no rales or rhonchi, good excursion bilaterally Heart regular rate and rhythm, no murmur appreciated Abd soft, nontender, positive bowel sounds, no liver or spleen tip palpated on exam, no fluid wave  MSK no focal spinal tenderness, no joint edema Neuro: non-focal,  well-oriented, appropriate affect Breasts: Deferred this visit, no changes per patient  Lab Results  Component Value Date   WBC 4.6 12/20/2017   HGB 13.7 12/20/2017   HCT 42.8 12/20/2017   MCV 89.7 12/20/2017   PLT 137 (L) 12/20/2017   Lab Results  Component Value Date   FERRITIN 131 02/09/2017   Lab Results  Component Value Date   RBC 4.77 12/20/2017   No results found for: [name], [facility] Lab Results  Component Value Date   IGGSERUM 1,085 02/09/2017   IGMSERUM 336 (H) 02/09/2017   No results found for: [name], SPEI   Chemistry      Component Value Date/Time   NA 140 12/20/2017 0856   NA 142 02/09/2017 1039   K 4.3 12/20/2017 0856   CL 104 12/20/2017 0856   CO2 31 12/20/2017 0856   BUN 15 12/20/2017 0856   BUN 13 02/09/2017 1039   CREATININE 0.70 12/20/2017 0856      Component Value Date/Time   CALCIUM 9.3 12/20/2017 0856   ALKPHOS 108 (H) 12/20/2017 0856   AST 69 (H) 12/20/2017 0856   ALT 61 (H) 12/20/2017 0856   BILITOT 0.7 12/20/2017 0856      Impression and Plan: Crystal Lee is a very pleasant 84 yo caucasian female with history of bilateral stage Ia breast cancer diagnosed back in 1998. So far she has done well and there has been no evidence of recurrence.  She also has thoracic adenopathy which chest CT today noted as stable.  We will continue to follow along with her and see her back in another 3 months for follow-up, scan and lab.  She will contact our office with any questions or concerns. We can certainly see her sooner if need be.   Laverna Peace, NP 7/24/201910:13 AM

## 2017-12-23 ENCOUNTER — Encounter: Payer: Self-pay | Admitting: Registered Nurse

## 2017-12-23 ENCOUNTER — Encounter: Payer: Medicare Other | Attending: Physical Medicine & Rehabilitation | Admitting: Registered Nurse

## 2017-12-23 VITALS — BP 127/77 | HR 82 | Ht 66.0 in | Wt 236.0 lb

## 2017-12-23 DIAGNOSIS — Z853 Personal history of malignant neoplasm of breast: Secondary | ICD-10-CM | POA: Insufficient documentation

## 2017-12-23 DIAGNOSIS — E785 Hyperlipidemia, unspecified: Secondary | ICD-10-CM | POA: Insufficient documentation

## 2017-12-23 DIAGNOSIS — Z87891 Personal history of nicotine dependence: Secondary | ICD-10-CM | POA: Diagnosis not present

## 2017-12-23 DIAGNOSIS — Z8673 Personal history of transient ischemic attack (TIA), and cerebral infarction without residual deficits: Secondary | ICD-10-CM | POA: Diagnosis not present

## 2017-12-23 DIAGNOSIS — M7918 Myalgia, other site: Secondary | ICD-10-CM | POA: Insufficient documentation

## 2017-12-23 DIAGNOSIS — Z8489 Family history of other specified conditions: Secondary | ICD-10-CM | POA: Insufficient documentation

## 2017-12-23 DIAGNOSIS — G8929 Other chronic pain: Secondary | ICD-10-CM | POA: Diagnosis not present

## 2017-12-23 DIAGNOSIS — Z79899 Other long term (current) drug therapy: Secondary | ICD-10-CM | POA: Diagnosis not present

## 2017-12-23 DIAGNOSIS — Z9013 Acquired absence of bilateral breasts and nipples: Secondary | ICD-10-CM | POA: Insufficient documentation

## 2017-12-23 DIAGNOSIS — I10 Essential (primary) hypertension: Secondary | ICD-10-CM | POA: Diagnosis not present

## 2017-12-23 DIAGNOSIS — R202 Paresthesia of skin: Secondary | ICD-10-CM | POA: Diagnosis not present

## 2017-12-23 DIAGNOSIS — M8580 Other specified disorders of bone density and structure, unspecified site: Secondary | ICD-10-CM | POA: Insufficient documentation

## 2017-12-23 DIAGNOSIS — M1712 Unilateral primary osteoarthritis, left knee: Secondary | ICD-10-CM | POA: Diagnosis not present

## 2017-12-23 DIAGNOSIS — Z9049 Acquired absence of other specified parts of digestive tract: Secondary | ICD-10-CM | POA: Diagnosis not present

## 2017-12-23 DIAGNOSIS — M25552 Pain in left hip: Secondary | ICD-10-CM | POA: Diagnosis not present

## 2017-12-23 DIAGNOSIS — Z85820 Personal history of malignant melanoma of skin: Secondary | ICD-10-CM | POA: Diagnosis not present

## 2017-12-23 DIAGNOSIS — M2550 Pain in unspecified joint: Secondary | ICD-10-CM

## 2017-12-23 DIAGNOSIS — G4730 Sleep apnea, unspecified: Secondary | ICD-10-CM | POA: Diagnosis not present

## 2017-12-23 DIAGNOSIS — K219 Gastro-esophageal reflux disease without esophagitis: Secondary | ICD-10-CM | POA: Insufficient documentation

## 2017-12-23 DIAGNOSIS — Z96651 Presence of right artificial knee joint: Secondary | ICD-10-CM | POA: Insufficient documentation

## 2017-12-23 DIAGNOSIS — Z5181 Encounter for therapeutic drug level monitoring: Secondary | ICD-10-CM | POA: Diagnosis not present

## 2017-12-23 DIAGNOSIS — M79605 Pain in left leg: Secondary | ICD-10-CM

## 2017-12-23 DIAGNOSIS — Z9889 Other specified postprocedural states: Secondary | ICD-10-CM | POA: Diagnosis not present

## 2017-12-23 DIAGNOSIS — M7062 Trochanteric bursitis, left hip: Secondary | ICD-10-CM | POA: Diagnosis not present

## 2017-12-23 DIAGNOSIS — M79604 Pain in right leg: Secondary | ICD-10-CM | POA: Diagnosis not present

## 2017-12-23 DIAGNOSIS — Z96643 Presence of artificial hip joint, bilateral: Secondary | ICD-10-CM | POA: Insufficient documentation

## 2017-12-23 HISTORY — DX: Trochanteric bursitis, left hip: M70.62

## 2017-12-23 MED ORDER — HYDROCODONE-ACETAMINOPHEN 5-325 MG PO TABS: 1.0000 | ORAL_TABLET | Freq: Four times a day (QID) | ORAL | 0 refills | Status: DC | PRN

## 2017-12-23 NOTE — Progress Notes (Signed)
Subjective:    Patient ID: Crystal Lee, female    DOB: 06-Jun-1933, 84 y.o.   MRN: [id number]  HPI: [name] is a 84 year old female who returns for follow up appointment for chronic pain and medication refill. She states her pain is located in her left hip and lower extremities with tingling an burning. She rates her pain 8. Also reports increase intensity of hip pain, will increase hydrocodone tablets based on assessment she verbalizes understanding. Her current exercise regime is walking with walker.   Ms. Marton Morphine equivalent is 15.00 MME. Oral swab ordered today.   Pain Inventory Average Pain 8 Pain Right Now 8 My pain is burning  In the last 24 hours, has pain interfered with the following? General activity 7 Relation with others 7 Enjoyment of life 7 What TIME of day is your pain at its worst? evening Sleep (in general) Fair  Pain is worse with: walking, standing and some activites Pain improves with: rest and medication Relief from Meds: 7  Mobility walk with assistance use a cane use a walker ability to climb steps?  yes do you drive?  yes  Function retired  Neuro/Psych weakness tremor trouble walking  Prior Studies CT/MRI  Physicians involved in your care Any changes since last visit?  no   Family History  Problem Relation Age of Onset  . Other Mother        complications from flu  . Other Father        unsure of cause   Social History   Socioeconomic History  . Marital status: Married    Spouse name: Not on file  . Number of children: 3  . Years of education: 16 years  . Highest education level: Not on file  Occupational History  . Occupation: Retired  Scientific laboratory technician  . Financial resource strain: Not on file  . Food insecurity:    Worry: Not on file    Inability: Not on file  . Transportation needs:    Medical: Not on file    Non-medical: Not on file  Tobacco Use  . Smoking status: Former Smoker    Last attempt to  quit: 02/03/1976    Years since quitting: 41.9  . Smokeless tobacco: Never Used  Substance and Sexual Activity  . Alcohol use: No  . Drug use: No  . Sexual activity: Yes    Birth control/protection: Post-menopausal  Lifestyle  . Physical activity:    Days per week: Not on file    Minutes per session: Not on file  . Stress: Not on file  Relationships  . Social connections:    Talks on phone: Not on file    Gets together: Not on file    Attends religious service: Not on file    Active member of club or organization: Not on file    Attends meetings of clubs or organizations: Not on file    Relationship status: Not on file  Other Topics Concern  . Not on file  Social History Narrative   Lives at home with husband.   Right-handed.   No caffeine use.   Past Surgical History:  Procedure Laterality Date  . APPENDECTOMY  1949  . [place]  . BREAST SURGERY    . CHOLECYSTECTOMY    . JOINT REPLACEMENT     RT TOTAL HIP / RT TOTAL KNEE  . MASTECTOMY  1998   BILATERAL   . SKIN CANCER EXCISION  2016  RT SIDE OF NOSE  . TOTAL HIP ARTHROPLASTY  2010   RIGHT  . TOTAL HIP ARTHROPLASTY Left 05/09/2015   Procedure: LEFT TOTAL HIP ARTHROPLASTY ANTERIOR APPROACH;  Surgeon: Gaynelle Arabian, MD;  Location: [facility];  Service: Orthopedics;  Laterality: Left;  . TOTAL KNEE ARTHROPLASTY  2001  . TUMOR REMOVAL  2012   ABDOMINAL - NON CANCEROUS   Past Medical History:  Diagnosis Date  . Arthritis   . Cancer (HCC)    HX BREAST CANCER/ SKIN CANCER  . Complication of anesthesia    N/V WITH MORPHINE  . Difficulty sleeping   . Fractured hip ([place])    LEFT - AUG 2016  . GERD (gastroesophageal reflux disease)   . Hyperlipidemia   . Hypertension   . Melanoma ([place])   . Neuropathy   . Nocturia   . Osteopenia   . 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 PONV (postoperative nausea and vomiting)    PT STATES MORPHINE CAUSED N/V  . Rosacea   . Sleep apnea   . Stage 1 breast  cancer, ER+, right (HCC) 07/04/2017   BP 127/77   Pulse 82   Ht 5\' 6"  (1.676 m)   Wt 236 lb (107 kg)   SpO2 96%   BMI 38.09 kg/m   Opioid Risk Score:   Fall Risk Score:  `1  Depression screen PHQ 2/9  Depression screen PHQ 2/9 10/17/2017  Decreased Interest 0  Down, Depressed, Hopeless 0  PHQ - 2 Score 0  Altered sleeping 3  Tired, decreased energy 3  Change in appetite 0  Feeling bad or failure about yourself  0  Trouble concentrating 0  Moving slowly or fidgety/restless 0  Suicidal thoughts 0  PHQ-9 Score 6  Difficult doing work/chores Somewhat difficult     Review of Systems  Constitutional: Negative.   HENT: Negative.   Eyes: Negative.   Respiratory: Negative.   Cardiovascular: Negative.   Gastrointestinal: Negative.   Endocrine: Negative.   Genitourinary: Negative.   Musculoskeletal: Positive for arthralgias, gait problem, joint swelling and myalgias.  Skin: Negative.   Allergic/Immunologic: Negative.   Neurological: Positive for tremors and weakness.  Hematological: Negative.   Psychiatric/Behavioral: Negative.   All other systems reviewed and are negative.      Objective:   Physical Exam  Constitutional: She is oriented to person, place, and time. She appears well-developed and well-nourished.  HENT:  Head: Normocephalic and atraumatic.  Neck: Normal range of motion. Neck supple.  Cardiovascular: Normal rate and regular rhythm.  Pulmonary/Chest: Effort normal and breath sounds normal.  Musculoskeletal:  Normal Muscle Bulk and Muscle Testing Reveals: Upper Extremities: Full ROM and Muscle Strength 5/5 Left Greater Trochanter Tenderness Lower Extremities: Full ROM and Muscle Strength 5/5 Bilateral Lower Extremities Flexion Produces Pain into Bilateral Patella's Arises from Table slowly using walker for support Antalgic gait  Neurological: She is alert and oriented to person, place, and time.  Skin: Skin is warm and dry.  Psychiatric: She has a  normal mood and affect. Her behavior is normal.  Nursing note and vitals reviewed.         Assessment & Plan:  1. Chronic Bilateral Leg pain: Paresthesia: Continue Lyrica. Continue to Monitor.  2. Chronic Left Hip Pain: Continue current medication regimenm. Continue to monitor 3. Left Knee OA: Continue Voltaren Gel. Continue to monitor.  4. Polyarthralgia: Continue to alternate with heat and ice therapy. Continue current medication regime. Continue to monitor.  5. Chronic Pain Syndrome:  Refilled: Increased: Hydrocodone 5/325 mg one tablet every 6 hours as needed for moderate pain #120.   30 minutes of face to face patient care time was spent during this visit. All questions were encouraged and answered.  F/U in 1 month

## 2017-12-27 LAB — DRUG TOX MONITOR 1 W/CONF, ORAL FLD
AMPHETAMINES: NEGATIVE ng/mL (ref <10)
BARBITURATES: NEGATIVE ng/mL (ref <10)
BENZODIAZEPINES: NEGATIVE ng/mL (ref <0.50)
BUPRENORPHINE: NEGATIVE ng/mL (ref <0.10)
Cocaine: NEGATIVE ng/mL (ref <5.0)
Codeine: NEGATIVE ng/mL (ref <2.5)
Dihydrocodeine: 3.3 ng/mL — ABNORMAL HIGH (ref <2.5)
Fentanyl: NEGATIVE ng/mL (ref <0.10)
HYDROMORPHONE: NEGATIVE ng/mL (ref <2.5)
Heroin Metabolite: NEGATIVE ng/mL (ref <1.0)
Hydrocodone: 20.2 ng/mL — ABNORMAL HIGH (ref <2.5)
MARIJUANA: NEGATIVE ng/mL (ref <2.5)
MDMA: NEGATIVE ng/mL (ref <10)
MORPHINE: NEGATIVE ng/mL (ref <2.5)
Meprobamate: NEGATIVE ng/mL (ref <2.5)
Methadone: NEGATIVE ng/mL (ref <5.0)
NORHYDROCODONE: NEGATIVE ng/mL (ref <2.5)
Nicotine Metabolite: NEGATIVE ng/mL (ref <5.0)
Noroxycodone: NEGATIVE ng/mL (ref <2.5)
Opiates: POSITIVE ng/mL — AB (ref <2.5)
Oxycodone: NEGATIVE ng/mL (ref <2.5)
Oxymorphone: NEGATIVE ng/mL (ref <2.5)
Phencyclidine: NEGATIVE ng/mL (ref <10)
Tapentadol: NEGATIVE ng/mL (ref <5.0)
Tramadol: NEGATIVE ng/mL (ref <5.0)
Zolpidem: NEGATIVE ng/mL (ref <5.0)

## 2017-12-27 LAB — DRUG TOX ALC METAB W/CON, ORAL FLD: Alcohol Metabolite: NEGATIVE ng/mL (ref <25)

## 2017-12-28 ENCOUNTER — Telehealth: Payer: Self-pay | Admitting: *Deleted

## 2017-12-28 NOTE — Telephone Encounter (Signed)
Oral swab drug screen was consistent for prescribed medications.  ?

## 2018-01-05 DIAGNOSIS — G959 Disease of spinal cord, unspecified: Secondary | ICD-10-CM | POA: Diagnosis not present

## 2018-01-05 DIAGNOSIS — Z6838 Body mass index (BMI) 38.0-38.9, adult: Secondary | ICD-10-CM | POA: Diagnosis not present

## 2018-01-05 DIAGNOSIS — R030 Elevated blood-pressure reading, without diagnosis of hypertension: Secondary | ICD-10-CM | POA: Diagnosis not present

## 2018-01-09 ENCOUNTER — Other Ambulatory Visit: Payer: Self-pay | Admitting: Neurological Surgery

## 2018-01-09 DIAGNOSIS — G959 Disease of spinal cord, unspecified: Secondary | ICD-10-CM

## 2018-01-15 ENCOUNTER — Ambulatory Visit
Admission: RE | Admit: 2018-01-15 | Discharge: 2018-01-15 | Disposition: A | Discharge status: Home / Self Care | Payer: Medicare Other | Source: Ambulatory Visit | Attending: Neurological Surgery | Admitting: Neurological Surgery

## 2018-01-15 DIAGNOSIS — G959 Disease of spinal cord, unspecified: Secondary | ICD-10-CM

## 2018-01-15 DIAGNOSIS — M4802 Spinal stenosis, cervical region: Secondary | ICD-10-CM | POA: Diagnosis not present

## 2018-01-18 DIAGNOSIS — Z6837 Body mass index (BMI) 37.0-37.9, adult: Secondary | ICD-10-CM | POA: Diagnosis not present

## 2018-01-18 DIAGNOSIS — R030 Elevated blood-pressure reading, without diagnosis of hypertension: Secondary | ICD-10-CM | POA: Diagnosis not present

## 2018-01-18 DIAGNOSIS — R200 Anesthesia of skin: Secondary | ICD-10-CM | POA: Diagnosis not present

## 2018-01-20 ENCOUNTER — Encounter: Payer: Self-pay | Admitting: Registered Nurse

## 2018-01-20 ENCOUNTER — Encounter: Payer: Medicare Other | Attending: Physical Medicine & Rehabilitation | Admitting: Registered Nurse

## 2018-01-20 VITALS — BP 130/79 | HR 79 | Ht 66.0 in | Wt 235.0 lb

## 2018-01-20 DIAGNOSIS — Z853 Personal history of malignant neoplasm of breast: Secondary | ICD-10-CM | POA: Insufficient documentation

## 2018-01-20 DIAGNOSIS — R202 Paresthesia of skin: Secondary | ICD-10-CM | POA: Diagnosis not present

## 2018-01-20 DIAGNOSIS — M79605 Pain in left leg: Secondary | ICD-10-CM | POA: Diagnosis not present

## 2018-01-20 DIAGNOSIS — M1712 Unilateral primary osteoarthritis, left knee: Secondary | ICD-10-CM

## 2018-01-20 DIAGNOSIS — Z8489 Family history of other specified conditions: Secondary | ICD-10-CM | POA: Diagnosis not present

## 2018-01-20 DIAGNOSIS — Z9049 Acquired absence of other specified parts of digestive tract: Secondary | ICD-10-CM | POA: Insufficient documentation

## 2018-01-20 DIAGNOSIS — Z87891 Personal history of nicotine dependence: Secondary | ICD-10-CM | POA: Insufficient documentation

## 2018-01-20 DIAGNOSIS — Z96643 Presence of artificial hip joint, bilateral: Secondary | ICD-10-CM | POA: Insufficient documentation

## 2018-01-20 DIAGNOSIS — G4730 Sleep apnea, unspecified: Secondary | ICD-10-CM | POA: Diagnosis not present

## 2018-01-20 DIAGNOSIS — M8580 Other specified disorders of bone density and structure, unspecified site: Secondary | ICD-10-CM | POA: Diagnosis not present

## 2018-01-20 DIAGNOSIS — M7918 Myalgia, other site: Secondary | ICD-10-CM | POA: Insufficient documentation

## 2018-01-20 DIAGNOSIS — Z96642 Presence of left artificial hip joint: Secondary | ICD-10-CM | POA: Diagnosis not present

## 2018-01-20 DIAGNOSIS — Z8673 Personal history of transient ischemic attack (TIA), and cerebral infarction without residual deficits: Secondary | ICD-10-CM | POA: Diagnosis not present

## 2018-01-20 DIAGNOSIS — Z9889 Other specified postprocedural states: Secondary | ICD-10-CM | POA: Diagnosis not present

## 2018-01-20 DIAGNOSIS — Z96651 Presence of right artificial knee joint: Secondary | ICD-10-CM | POA: Insufficient documentation

## 2018-01-20 DIAGNOSIS — Z5181 Encounter for therapeutic drug level monitoring: Secondary | ICD-10-CM | POA: Diagnosis not present

## 2018-01-20 DIAGNOSIS — Z85820 Personal history of malignant melanoma of skin: Secondary | ICD-10-CM | POA: Insufficient documentation

## 2018-01-20 DIAGNOSIS — M25552 Pain in left hip: Secondary | ICD-10-CM | POA: Diagnosis not present

## 2018-01-20 DIAGNOSIS — K219 Gastro-esophageal reflux disease without esophagitis: Secondary | ICD-10-CM | POA: Insufficient documentation

## 2018-01-20 DIAGNOSIS — I10 Essential (primary) hypertension: Secondary | ICD-10-CM | POA: Diagnosis not present

## 2018-01-20 DIAGNOSIS — G8929 Other chronic pain: Secondary | ICD-10-CM

## 2018-01-20 DIAGNOSIS — Z9013 Acquired absence of bilateral breasts and nipples: Secondary | ICD-10-CM | POA: Insufficient documentation

## 2018-01-20 DIAGNOSIS — Z79899 Other long term (current) drug therapy: Secondary | ICD-10-CM | POA: Diagnosis not present

## 2018-01-20 DIAGNOSIS — M7062 Trochanteric bursitis, left hip: Secondary | ICD-10-CM | POA: Diagnosis not present

## 2018-01-20 DIAGNOSIS — E785 Hyperlipidemia, unspecified: Secondary | ICD-10-CM | POA: Insufficient documentation

## 2018-01-20 DIAGNOSIS — M79604 Pain in right leg: Secondary | ICD-10-CM

## 2018-01-20 MED ORDER — HYDROCODONE-ACETAMINOPHEN 5-325 MG PO TABS: 1.0000 | ORAL_TABLET | Freq: Four times a day (QID) | ORAL | 0 refills | Status: DC | PRN

## 2018-01-20 MED ORDER — BACLOFEN 10 MG PO TABS: 5.0000 mg | ORAL_TABLET | Freq: Every evening | ORAL | 3 refills | Status: DC | PRN

## 2018-01-20 NOTE — Progress Notes (Signed)
Subjective:    Patient ID: Crystal Lee, female    DOB: 1934/02/11, 84 y.o.   MRN: [id number]  HPI: [name] is a 84 year old female who returns for follow up appointment for chronic pain and medication refill. She states her pain is located in her lower back, left hip status post injection by Dr. Wynelle Link, and left knee pain. She rates her pain 8. Her current exercise regime is walking with walker or cane in her home she reports.   Ms. Swalley Morphine Equivalent is 20.00 MME. Last Oral Swab was Performed on 12/23/2017, it was consistent.   Pain Inventory Average Pain 6 Pain Right Now 8 My pain is burning  In the last 24 hours, has pain interfered with the following? General activity 7 Relation with others 7 Enjoyment of life 7 What TIME of day is your pain at its worst? evening Sleep (in general) Poor  Pain is worse with: walking and standing Pain improves with: rest and medication Relief from Meds: 6  Mobility use a cane use a walker do you drive?  no  Function retired  Neuro/Psych numbness trouble walking  Prior Studies Any changes since last visit?  no  Physicians involved in your care Any changes since last visit?  no   Family History  Problem Relation Age of Onset  . Other Mother        complications from flu  . Other Father        unsure of cause   Social History   Socioeconomic History  . Marital status: Married    Spouse name: Not on file  . Number of children: 3  . Years of education: 16 years  . Highest education level: Not on file  Occupational History  . Occupation: Retired  Scientific laboratory technician  . Financial resource strain: Not on file  . Food insecurity:    Worry: Not on file    Inability: Not on file  . Transportation needs:    Medical: Not on file    Non-medical: Not on file  Tobacco Use  . Smoking status: Former Smoker    Last attempt to quit: 02/03/1976    Years since quitting: 41.9  . Smokeless tobacco: Never Used  Substance  and Sexual Activity  . Alcohol use: No  . Drug use: No  . Sexual activity: Yes    Birth control/protection: Post-menopausal  Lifestyle  . Physical activity:    Days per week: Not on file    Minutes per session: Not on file  . Stress: Not on file  Relationships  . Social connections:    Talks on phone: Not on file    Gets together: Not on file    Attends religious service: Not on file    Active member of club or organization: Not on file    Attends meetings of clubs or organizations: Not on file    Relationship status: Not on file  Other Topics Concern  . Not on file  Social History Narrative   Lives at home with husband.   Right-handed.   No caffeine use.   Past Surgical History:  Procedure Laterality Date  . APPENDECTOMY  1949  . [place]  . BREAST SURGERY    . CHOLECYSTECTOMY    . JOINT REPLACEMENT     RT TOTAL HIP / RT TOTAL KNEE  . MASTECTOMY  1998   BILATERAL   . SKIN CANCER EXCISION  2016   RT SIDE OF  NOSE  . TOTAL HIP ARTHROPLASTY  2010   RIGHT  . TOTAL HIP ARTHROPLASTY Left 05/09/2015   Procedure: LEFT TOTAL HIP ARTHROPLASTY ANTERIOR APPROACH;  Surgeon: Gaynelle Arabian, MD;  Location: [facility];  Service: Orthopedics;  Laterality: Left;  . TOTAL KNEE ARTHROPLASTY  2001  . TUMOR REMOVAL  2012   ABDOMINAL - NON CANCEROUS   Past Medical History:  Diagnosis Date  . Arthritis   . Cancer (HCC)    HX BREAST CANCER/ SKIN CANCER  . Complication of anesthesia    N/V WITH MORPHINE  . Difficulty sleeping   . Fractured hip ([place])    LEFT - AUG 2016  . GERD (gastroesophageal reflux disease)   . Hyperlipidemia   . Hypertension   . Melanoma ([place])   . Neuropathy   . Nocturia   . Osteopenia   . 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 PONV (postoperative nausea and vomiting)    PT STATES MORPHINE CAUSED N/V  . Rosacea   . Sleep apnea   . Stage 1 breast cancer, ER+, right (HCC) 07/04/2017   BP 130/79   Pulse 79   Ht 5\' 6"  (1.676 m)   Wt 235 lb  (106.6 kg)   SpO2 95%   BMI 37.93 kg/m   Opioid Risk Score:   Fall Risk Score:  `1  Depression screen PHQ 2/9  Depression screen PHQ 2/9 10/17/2017  Decreased Interest 0  Down, Depressed, Hopeless 0  PHQ - 2 Score 0  Altered sleeping 3  Tired, decreased energy 3  Change in appetite 0  Feeling bad or failure about yourself  0  Trouble concentrating 0  Moving slowly or fidgety/restless 0  Suicidal thoughts 0  PHQ-9 Score 6  Difficult doing work/chores Somewhat difficult     Review of Systems  Constitutional: Negative.   HENT: Negative.   Eyes: Negative.   Respiratory: Negative.   Cardiovascular: Negative.   Gastrointestinal: Negative.   Endocrine: Negative.   Genitourinary: Negative.   Musculoskeletal: Positive for arthralgias, gait problem and myalgias.  Skin: Negative.   Allergic/Immunologic: Negative.   Neurological: Positive for numbness.  Hematological: Negative.   Psychiatric/Behavioral: Negative.   All other systems reviewed and are negative.      Objective:   Physical Exam  Constitutional: She is oriented to person, place, and time. She appears well-developed and well-nourished.  HENT:  Head: Normocephalic and atraumatic.  Neck: Normal range of motion. Neck supple.  Cardiovascular: Normal rate and regular rhythm.  Pulmonary/Chest: Effort normal and breath sounds normal.  Musculoskeletal:  Normal Muscle Bulk and Muscle Testing Reveals: Upper Extremities: Full ROM and Muscle Strength 5/5 Bilateral AC Joint Tenderness Left Greater Trochanter Tenderness Lower Extremities Decreased ROM and Muscle Strength 4/5 Bilateral Lower Extremities Flexion Produces Pain into Bilateral Lower Extremities Arises from Table Slowly using walker for support Narrow Based Gait  Neurological: She is alert and oriented to person, place, and time.  Skin: Skin is warm and dry.  Psychiatric: She has a normal mood and affect. Her behavior is normal.  Nursing note and vitals  reviewed.         Assessment & Plan:  1. Chronic Bilateral Leg pain: Paresthesia: Continue Lyrica. Continue to Monitor. 01/20/2018 2. Chronic Left Hip Pain: S/P Injection by Dr. Wynelle Link  She reports.Continue current medication regimenm. Continue to monitor. 01/20/2018 3. Left Knee OA: Continue Voltaren Gel. Continue to monitor. 01/20/2018. 4. Polyarthralgia: Continue to alternate with heat and ice therapy. Continue current medication regime. Continue  to monitor. 01/20/2018. 5. Chronic Pain Syndrome: Refilled:  Hydrocodone 5/325 mg one tablet every 6 hours as needed for moderate pain #120. 01/20/2018.  30 minutes of face to face patient care time was spent during this visit. All questions were encouraged and answered.  F/U in 1 month

## 2018-01-31 NOTE — Progress Notes (Addendum)
[facility] [place], [place], [place] [address] [phone]  Date:  02/02/2018   Name:  Crystal Lee   DOB:  01-12-1934   MRN:  [id number]  PCP:  Darreld Mclean, MD    Chief Complaint: New Patient (Initial Visit) (moved from [facility], moved here 1 year ago, would like to establish care ) and medication Refills (3 month supply on all medications, would like refill on GI cocktail)   History of Present Illness:  Crystal Lee is a 84 y.o. very pleasant female patient who presents with the following:  Here today as a new patient to establish primary care History of breast cancer, osteoporosis. S/p bilateral mastectomy  History of HTN and hyperlipidemia She cannot recall a recent dexa scan She has her immunization records at home and will bring these for me   They moved to this area from [facility] a couple of years ago [place] and they do have some more distant family here  Her husband was Nature conservation officer and they moved around quite a bit over his long career. They lived overseas in [place], [place], [place], [place], [place], [place]. She stayed at home and raised the children They had 3 children but one passed away in an MVA at age 22yo 39. They do have 5 grands.   Referred to see [place] by Dr. Marin Lee who is seeing her for breast cancer and LAD noted in her chest on CT scan- per their most recent note from July:  Impression and Plan: [name] is a very pleasant 84 yo caucasian female with history of bilateral stage Ia breast cancer diagnosed back in 1998. So far she has done well and there has been no evidence of recurrence.  She also has thoracic adenopathy which chest CT today noted as stable.  We will continue to follow along with her and see her back in another 3 months for follow-up, scan and lab.   Labs done in July Crystal Lee is her orthopedist  Her husband suffered a severe stroke  in February- they go to the [facility] in [place] for much of his care He is not doing that well   She was told that her ANA was high in the past - she is not quite sure if this was followed up or what it meant.  Will repeat for her today   She has peripheral neuropathy for years- her feet will burn and hurt She takes lyrica and also hydrocodone for her neuropathy  lyrica is 100 mg tid She is going to pain management for this med  However she still has a hard time sleeping - wonders what she might take for this   Reviewed NCCSR:  01/20/2018  1  01/20/2018  Hydrocodone-Acetamin 5-325 Mg  120.00 30 Crystal Lee  [id number]  [facility] ([id number])  1/1 20.00 MME Comm Ins  [place]  12/23/2017  1  12/23/2017  Hydrocodone-Acetamin 5-325 Mg  120.00 30 Crystal Lee  [id number]  [facility] ([id number])  1/1 20.00 MME Comm Ins  [place]  11/24/2017  1  11/23/2017  Hydrocodone-Acetamin 5-325 Mg  90.00 30 Crystal Lee  [id number]  Wal ([id number])  1/1 15.00 MME Comm Ins    10/10/2017  1  10/10/2017  Tramadol Hcl 50 Mg Tablet  8.00 4 Crystal Lee  [id number]  Med [phone])  1/1       She does get heartburn and has a  large bottle of GI cocktail with her that she would like for me to refill  She actually takes a dose of this med most nights She did try protonix but had SE, stopped using it  She gets good relief from the GI cocktail and I don't think it will be harmful for her to continue to use this  Patient Active Problem List   Diagnosis Date Noted  . Primary osteoarthritis of left knee 10/17/2017  . History of right knee joint replacement 10/17/2017  . Pain in both lower extremities 08/02/2017  . Stage 1 breast cancer, ER+, right ([place]) 07/04/2017  . 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 Paresthesia 02/09/2017  . Low back pain 02/09/2017  . Gait abnormality 02/09/2017  . OA (osteoarthritis) of hip 05/09/2015    Past Medical History:  Diagnosis Date  . Arthritis   . Cancer (HCC)    HX BREAST CANCER/ SKIN CANCER  . Complication of anesthesia    N/V WITH  MORPHINE  . Difficulty sleeping   . Fractured hip ([place])    LEFT - AUG 2016  . GERD (gastroesophageal reflux disease)   . Hyperlipidemia   . Hypertension   . Melanoma ([place])   . Neuropathy   . Nocturia   . Osteopenia   . 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 PONV (postoperative nausea and vomiting)    PT STATES MORPHINE CAUSED N/V  . Rosacea   . Sleep apnea   . Stage 1 breast cancer, ER+, right ([place]) 07/04/2017    Past Surgical History:  Procedure Laterality Date  . ABDOMINAL HYSTERECTOMY  2010  . APPENDECTOMY  1949  . [facility]  . BREAST SURGERY    . CATARACT EXTRACTION Bilateral 2011  . CHOLECYSTECTOMY    . GALLBLADDER SURGERY  2015  . JOINT REPLACEMENT     RT TOTAL HIP / RT TOTAL KNEE  . MASTECTOMY  1998   BILATERAL   . SKIN CANCER EXCISION  2016   RT SIDE OF NOSE  . TONSILLECTOMY  1953  . TOTAL HIP ARTHROPLASTY  2010   RIGHT  . TOTAL HIP ARTHROPLASTY Left 05/09/2015   Procedure: LEFT TOTAL HIP ARTHROPLASTY ANTERIOR APPROACH;  Surgeon: Crystal Arabian, MD;  Location: [facility];  Service: Orthopedics;  Laterality: Left;  . TOTAL KNEE ARTHROPLASTY  2001  . TUMOR REMOVAL  2012   ABDOMINAL - NON CANCEROUS    Social History   Tobacco Use  . Smoking status: Former Smoker    Last attempt to quit: 02/03/1976    Years since quitting: 42.0  . Smokeless tobacco: Never Used  Substance Use Topics  . Alcohol use: No  . Drug use: No    Family History  Problem Relation Age of Onset  . Other Mother        complications from flu  . Other Father        unsure of cause    Allergies  Allergen Reactions  . Morphine And Related Nausea And Vomiting    Medication list has been reviewed and updated.  Current Outpatient Medications on File Prior to Visit  Medication Sig Dispense Refill  . atorvastatin (LIPITOR) 20 MG tablet Take 20 mg by mouth daily.    . baclofen (LIORESAL) 10 MG tablet Take 0.5-1 tablets (5-10 mg total) by mouth at bedtime as needed for  muscle spasms. 90 tablet 3  . CALCIUM PO Take 1,200 mg by mouth daily.    . Cholecalciferol (VITAMIN  D) 2000 units tablet Take 2,000 Units by mouth.    . conjugated estrogens (PREMARIN) vaginal cream Place 0.625 mg vaginally daily.    . diclofenac sodium (VOLTAREN) 1 % GEL Apply 1 application topically 3 (three) times daily. To feet, ankles, and knees. 3 Tube 4  . HYDROcodone-acetaminophen (NORCO/VICODIN) 5-325 MG tablet Take 1 tablet by mouth every 6 (six) hours as needed for moderate pain. 120 tablet 0  . LINZESS 290 MCG CAPS capsule Take 1 capsule by mouth daily.    Marland Kitchen losartan (COZAAR) 25 MG tablet Take 25 mg by mouth daily.    . pregabalin (LYRICA) 100 MG capsule Take 1 capsule (100 mg total) by mouth 3 (three) times daily. 90 capsule 5  . ondansetron (ZOFRAN-ODT) 4 MG disintegrating tablet as needed.    . pantoprazole (PROTONIX) 40 MG tablet Take 40 mg by mouth daily.     No current facility-administered medications on file prior to visit.     Review of Systems:  As per HPI- otherwise negative.  Physical Examination: Vitals:   02/02/18 1100  BP: 120/62  Pulse: 72  Resp: 16  Temp: 98 F (36.7 C)  SpO2: 97%   Vitals:   02/02/18 1100  Weight: 234 lb (106.1 kg)  Height: 5\' 6"  (1.676 m)   Body mass index is 37.77 kg/m. Ideal Body Weight: Weight in (lb) to have BMI = 25: 154.6  GEN: WDWN, NAD, Non-toxic, A & O x 3, obese, looks well  HEENT: Atraumatic, Normocephalic. Neck supple. No masses, No LAD. Ears and Nose: No external deformity. CV: RRR, No M/G/R. No JVD. No thrill. No extra heart sounds. PULM: CTA B, no wheezes, crackles, rhonchi. No retractions. No resp. distress. No accessory muscle use. EXTR: No c/c/e NEURO Normal gait for pt, uses a rolling walker  PSYCH: Normally interactive. Conversant. Not depressed or anxious appearing.  Calm demeanor.    Assessment and Plan: Need for influenza vaccination - Plan: Flu vaccine HIGH DOSE PF (Fluzone High  dose)  Dyslipidemia - Plan: Lipid panel  Elevated antinuclear antibody (ANA) level - Plan: ANA  Gastroesophageal reflux disease without esophagitis - Plan: Alum & Mag Hydroxide-Simeth (GI COCKTAIL) SUSP suspension, DISCONTINUED: Alum & Mag Hydroxide-Simeth (GI COCKTAIL) SUSP suspension  Estrogen deficiency - Plan: DG Bone Density  Screening for osteoporosis - Plan: DG Bone Density  Establishing care today Lipid and ANA ordred for her today  Refilled her GI cocktail- she may take 10 ml up to BID.  She requests a 90 day supply from mail away pharm  Ordered bone density Flu shot given today   Signed Lamar Blinks, MD  Received her labs 9/9-  Results for orders placed or performed in visit on 02/02/18  Lipid panel  Result Value Ref Range   Cholesterol 121 0 - 200 mg/dL   Triglycerides 101.0 0.0 - 149.0 mg/dL   HDL 43.20 >39.00 mg/dL   VLDL 20.2 0.0 - 40.0 mg/dL   LDL Cholesterol 58 0 - 99 mg/dL   Total CHOL/HDL Ratio 3    NonHDL 78.18   ANA  Result Value Ref Range   Anti Nuclear Antibody(ANA) NEGATIVE NEGATIVE   Letter to pt

## 2018-02-02 ENCOUNTER — Ambulatory Visit (INDEPENDENT_AMBULATORY_CARE_PROVIDER_SITE_OTHER): Payer: Medicare Other | Admitting: Family Medicine

## 2018-02-02 ENCOUNTER — Encounter: Payer: Self-pay | Admitting: Family Medicine

## 2018-02-02 VITALS — BP 120/62 | HR 72 | Temp 98.0°F | Resp 16 | Ht 66.0 in | Wt 234.0 lb

## 2018-02-02 DIAGNOSIS — E785 Hyperlipidemia, unspecified: Secondary | ICD-10-CM

## 2018-02-02 DIAGNOSIS — Z13820 Encounter for screening for osteoporosis: Secondary | ICD-10-CM

## 2018-02-02 DIAGNOSIS — Z23 Encounter for immunization: Secondary | ICD-10-CM | POA: Diagnosis not present

## 2018-02-02 DIAGNOSIS — E2839 Other primary ovarian failure: Secondary | ICD-10-CM

## 2018-02-02 DIAGNOSIS — K219 Gastro-esophageal reflux disease without esophagitis: Secondary | ICD-10-CM

## 2018-02-02 DIAGNOSIS — R768 Other specified abnormal immunological findings in serum: Secondary | ICD-10-CM

## 2018-02-02 HISTORY — DX: Hyperlipidemia, unspecified: E78.5

## 2018-02-02 LAB — LIPID PANEL
CHOLESTEROL: 121 mg/dL (ref 0–200)
HDL: 43.2 mg/dL (ref >39.00)
LDL CALC: 58 mg/dL (ref 0–99)
NONHDL: 78.18
Total CHOL/HDL Ratio: 3
Triglycerides: 101 mg/dL (ref 0.0–149.0)
VLDL: 20.2 mg/dL (ref 0.0–40.0)

## 2018-02-02 MED ORDER — GI COCKTAIL ~~LOC~~: ORAL | 1 refills | Status: DC

## 2018-02-02 MED ORDER — GI COCKTAIL ~~LOC~~: 10.0000 mL | Freq: Two times a day (BID) | ORAL | 2 refills | Status: DC | PRN

## 2018-02-02 NOTE — Patient Instructions (Signed)
It was nice to see you today!  You got your flu shot  I will be in touch with your labs We sent in an rx for [facility]   I ordered a bone density scan for you to check for osteoporosis- you will get a call about this appointment  Try some OTC melatonin for sleep

## 2018-02-05 LAB — ANA: ANA: NEGATIVE

## 2018-02-06 DIAGNOSIS — M4802 Spinal stenosis, cervical region: Secondary | ICD-10-CM | POA: Diagnosis not present

## 2018-02-06 DIAGNOSIS — G5603 Carpal tunnel syndrome, bilateral upper limbs: Secondary | ICD-10-CM | POA: Diagnosis not present

## 2018-02-09 DIAGNOSIS — G5602 Carpal tunnel syndrome, left upper limb: Secondary | ICD-10-CM | POA: Diagnosis not present

## 2018-02-15 DIAGNOSIS — L600 Ingrowing nail: Secondary | ICD-10-CM | POA: Diagnosis not present

## 2018-02-15 DIAGNOSIS — M79675 Pain in left toe(s): Secondary | ICD-10-CM | POA: Diagnosis not present

## 2018-02-15 DIAGNOSIS — L603 Nail dystrophy: Secondary | ICD-10-CM | POA: Diagnosis not present

## 2018-02-17 DIAGNOSIS — M7062 Trochanteric bursitis, left hip: Secondary | ICD-10-CM | POA: Diagnosis not present

## 2018-02-20 ENCOUNTER — Encounter: Payer: Self-pay | Admitting: Registered Nurse

## 2018-02-20 ENCOUNTER — Other Ambulatory Visit: Payer: Self-pay

## 2018-02-20 ENCOUNTER — Encounter: Payer: Medicare Other | Attending: Physical Medicine & Rehabilitation | Admitting: Registered Nurse

## 2018-02-20 VITALS — BP 132/75 | HR 69 | Ht 66.0 in | Wt 238.0 lb

## 2018-02-20 DIAGNOSIS — M79604 Pain in right leg: Secondary | ICD-10-CM | POA: Diagnosis not present

## 2018-02-20 DIAGNOSIS — Z96651 Presence of right artificial knee joint: Secondary | ICD-10-CM | POA: Diagnosis not present

## 2018-02-20 DIAGNOSIS — Z9889 Other specified postprocedural states: Secondary | ICD-10-CM | POA: Diagnosis not present

## 2018-02-20 DIAGNOSIS — G894 Chronic pain syndrome: Secondary | ICD-10-CM | POA: Diagnosis not present

## 2018-02-20 DIAGNOSIS — M79605 Pain in left leg: Secondary | ICD-10-CM | POA: Insufficient documentation

## 2018-02-20 DIAGNOSIS — M7918 Myalgia, other site: Secondary | ICD-10-CM | POA: Diagnosis not present

## 2018-02-20 DIAGNOSIS — M25552 Pain in left hip: Secondary | ICD-10-CM

## 2018-02-20 DIAGNOSIS — R202 Paresthesia of skin: Secondary | ICD-10-CM

## 2018-02-20 DIAGNOSIS — Z85820 Personal history of malignant melanoma of skin: Secondary | ICD-10-CM | POA: Insufficient documentation

## 2018-02-20 DIAGNOSIS — M2550 Pain in unspecified joint: Secondary | ICD-10-CM | POA: Diagnosis not present

## 2018-02-20 DIAGNOSIS — Z87891 Personal history of nicotine dependence: Secondary | ICD-10-CM | POA: Insufficient documentation

## 2018-02-20 DIAGNOSIS — M8580 Other specified disorders of bone density and structure, unspecified site: Secondary | ICD-10-CM | POA: Diagnosis not present

## 2018-02-20 DIAGNOSIS — Z9049 Acquired absence of other specified parts of digestive tract: Secondary | ICD-10-CM | POA: Diagnosis not present

## 2018-02-20 DIAGNOSIS — Z5181 Encounter for therapeutic drug level monitoring: Secondary | ICD-10-CM | POA: Diagnosis not present

## 2018-02-20 DIAGNOSIS — E785 Hyperlipidemia, unspecified: Secondary | ICD-10-CM | POA: Diagnosis not present

## 2018-02-20 DIAGNOSIS — Z79899 Other long term (current) drug therapy: Secondary | ICD-10-CM | POA: Diagnosis not present

## 2018-02-20 DIAGNOSIS — K219 Gastro-esophageal reflux disease without esophagitis: Secondary | ICD-10-CM | POA: Diagnosis not present

## 2018-02-20 DIAGNOSIS — Z8673 Personal history of transient ischemic attack (TIA), and cerebral infarction without residual deficits: Secondary | ICD-10-CM | POA: Insufficient documentation

## 2018-02-20 DIAGNOSIS — Z853 Personal history of malignant neoplasm of breast: Secondary | ICD-10-CM | POA: Insufficient documentation

## 2018-02-20 DIAGNOSIS — M1712 Unilateral primary osteoarthritis, left knee: Secondary | ICD-10-CM | POA: Insufficient documentation

## 2018-02-20 DIAGNOSIS — G4730 Sleep apnea, unspecified: Secondary | ICD-10-CM | POA: Diagnosis not present

## 2018-02-20 DIAGNOSIS — M62838 Other muscle spasm: Secondary | ICD-10-CM

## 2018-02-20 DIAGNOSIS — Z8489 Family history of other specified conditions: Secondary | ICD-10-CM | POA: Diagnosis not present

## 2018-02-20 DIAGNOSIS — Z96643 Presence of artificial hip joint, bilateral: Secondary | ICD-10-CM | POA: Insufficient documentation

## 2018-02-20 DIAGNOSIS — M25531 Pain in right wrist: Secondary | ICD-10-CM | POA: Diagnosis not present

## 2018-02-20 DIAGNOSIS — G8929 Other chronic pain: Secondary | ICD-10-CM | POA: Diagnosis not present

## 2018-02-20 DIAGNOSIS — M25532 Pain in left wrist: Secondary | ICD-10-CM | POA: Diagnosis not present

## 2018-02-20 DIAGNOSIS — Z9013 Acquired absence of bilateral breasts and nipples: Secondary | ICD-10-CM | POA: Insufficient documentation

## 2018-02-20 DIAGNOSIS — I10 Essential (primary) hypertension: Secondary | ICD-10-CM | POA: Insufficient documentation

## 2018-02-20 MED ORDER — HYDROCODONE-ACETAMINOPHEN 5-325 MG PO TABS: 1.0000 | ORAL_TABLET | Freq: Four times a day (QID) | ORAL | 0 refills | Status: DC | PRN

## 2018-02-20 MED ORDER — PREGABALIN 100 MG PO CAPS: 100.0000 mg | ORAL_CAPSULE | Freq: Three times a day (TID) | ORAL | 1 refills | Status: DC

## 2018-02-20 MED ORDER — PREGABALIN 100 MG PO CAPS: 100.0000 mg | ORAL_CAPSULE | Freq: Three times a day (TID) | ORAL | 5 refills | Status: DC

## 2018-02-20 NOTE — Progress Notes (Signed)
Subjective:    Patient ID: Crystal Lee, female    DOB: 1933/06/06, 84 y.o.   MRN: [id number]  HPI: [name] is a 84 year old female who returns for follow up appointment for chronic pain and medication refill. She states her pain is locate in her bilateral wrist L>R reports she has Carpal Tunnel and will be having surgery with Dr. Ellene Route, lower back pain radiating into her lower extremities. Also reports generalized pain all over. She rates her pain 8. Her current exercise regime is walking and house hold chores.   [name] Equivalent is 20.00 MME. Last Oral Swab was Performed on 12/23/2017, it was consistent.    Pain Inventory Average Pain 7 Pain Right Now 8 My pain is burning and aching  In the last 24 hours, has pain interfered with the following? General activity 6 Relation with others 6 Enjoyment of life 6 What TIME of day is your pain at its worst? evening night Sleep (in general) Fair  Pain is worse with: walking and standing Pain improves with: rest and medication Relief from Meds: 6  Mobility walk with assistance use a cane use a walker ability to climb steps?  no do you drive?  yes Do you have any goals in this area?  yes  Function not employed: date last employed n/a retired I need assistance with the following:  household duties and shopping  Neuro/Psych numbness trouble walking  Prior Studies Any changes since last visit?  no  Physicians involved in your care Any changes since last visit?  no   Family History  Problem Relation Age of Onset  . Other Mother        complications from flu  . Other Father        unsure of cause   Social History   Socioeconomic History  . Marital status: Married    Spouse name: Not on file  . Number of children: 3  . Years of education: 16 years  . Highest education level: Not on file  Occupational History  . Occupation: Retired  Scientific laboratory technician  . Financial resource strain: Not on file  .  Food insecurity:    Worry: Not on file    Inability: Not on file  . Transportation needs:    Medical: Not on file    Non-medical: Not on file  Tobacco Use  . Smoking status: Former Smoker    Last attempt to quit: 02/03/1976    Years since quitting: 42.0  . Smokeless tobacco: Never Used  Substance and Sexual Activity  . Alcohol use: No  . Drug use: No  . Sexual activity: Yes    Birth control/protection: Post-menopausal  Lifestyle  . Physical activity:    Days per week: Not on file    Minutes per session: Not on file  . Stress: Not on file  Relationships  . Social connections:    Talks on phone: Not on file    Gets together: Not on file    Attends religious service: Not on file    Active member of club or organization: Not on file    Attends meetings of clubs or organizations: Not on file    Relationship status: Not on file  Other Topics Concern  . Not on file  Social History Narrative   Lives at home with husband.   Right-handed.   No caffeine use.   Past Surgical History:  Procedure Laterality Date  . ABDOMINAL HYSTERECTOMY  2010  .  APPENDECTOMY  1949  . [place]  . BREAST SURGERY    . CATARACT EXTRACTION Bilateral 2011  . CHOLECYSTECTOMY    . GALLBLADDER SURGERY  2015  . JOINT REPLACEMENT     RT TOTAL HIP / RT TOTAL KNEE  . MASTECTOMY  1998   BILATERAL   . SKIN CANCER EXCISION  2016   RT SIDE OF NOSE  . TONSILLECTOMY  1953  . TOTAL HIP ARTHROPLASTY  2010   RIGHT  . TOTAL HIP ARTHROPLASTY Left 05/09/2015   Procedure: LEFT TOTAL HIP ARTHROPLASTY ANTERIOR APPROACH;  Surgeon: Gaynelle Arabian, MD;  Location: [facility];  Service: Orthopedics;  Laterality: Left;  . TOTAL KNEE ARTHROPLASTY  2001  . TUMOR REMOVAL  2012   ABDOMINAL - NON CANCEROUS   Past Medical History:  Diagnosis Date  . Arthritis   . Cancer (HCC)    HX BREAST CANCER/ SKIN CANCER  . Complication of anesthesia    N/V WITH Lee  . Difficulty sleeping   . Fractured hip ([place])    LEFT -  AUG 2016  . GERD (gastroesophageal reflux disease)   . Hyperlipidemia   . Hypertension   . Melanoma ([place])   . Neuropathy   . Nocturia   . Osteopenia   . 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 PONV (postoperative nausea and vomiting)    PT STATES Lee CAUSED N/V  . Rosacea   . Sleep apnea   . Stage 1 breast cancer, ER+, right (HCC) 07/04/2017   BP 132/75   Pulse 69   Ht 5\' 6"  (1.676 m)   Wt 238 lb (108 kg)   SpO2 93%   BMI 38.41 kg/m   Opioid Risk Score:   Fall Risk Score:  `1  Depression screen PHQ 2/9  Depression screen [facility] 2/9 02/20/2018 10/17/2017  Decreased Interest 0 0  Down, Depressed, Hopeless 0 0  PHQ - 2 Score 0 0  Altered sleeping - 3  Tired, decreased energy - 3  Change in appetite - 0  Feeling bad or failure about yourself  - 0  Trouble concentrating - 0  Moving slowly or fidgety/restless - 0  Suicidal thoughts - 0  PHQ-9 Score - 6  Difficult doing work/chores - Somewhat difficult     Review of Systems  Constitutional: Negative.   HENT: Negative.   Eyes: Negative.   Respiratory: Negative.   Cardiovascular: Negative.   Gastrointestinal: Positive for constipation.  Endocrine: Negative.   Genitourinary: Negative.   Musculoskeletal: Negative.   Skin: Negative.   Allergic/Immunologic: Negative.   Neurological: Negative.   Hematological: Negative.   Psychiatric/Behavioral: Negative.   All other systems reviewed and are negative.      Objective:   Physical Exam  Constitutional: She is oriented to person, place, and time. She appears well-developed and well-nourished.  HENT:  Head: Normocephalic and atraumatic.  Neck: Normal range of motion. Neck supple.  Cardiovascular: Normal rate and regular rhythm.  Pulmonary/Chest: Effort normal and breath sounds normal.  Musculoskeletal:  Normal Muscle Bulk and Muscle Testing Reveals: Upper Extremities: Full ROM and Muscle Strength 4/5 Thoracic and Lumbar Hypersensitivity Lower  Extremities: Right: Full ROM and Muscle Strength 5/5 Left: Decreased ROM and Muscle Strength 5/5 Arises from chair slowly using walker for support Narrow Based Gait  Neurological: She is alert and oriented to person, place, and time.  Skin: Skin is warm and dry.  Psychiatric: She has a normal mood and affect. Her behavior is normal.  Nursing note and vitals reviewed.         Assessment & Plan:  1. Chronic Bilateral Leg pain: Paresthesia: Continue Lyrica. Continue to Monitor. 02/20/2018 2. Chronic Left Hip Pain: S/P Injection by Dr. Wynelle Link. Continue current medication regimenm. Continue to monitor. 02/20/2018 3. Left Knee OA: Continue Voltaren Gel. Continue to monitor. 02/20/2018. 4. Polyarthralgia: Continue to alternate with heat and ice therapy. Continue current medication regime. Continue to monitor. 02/20/2018. 5. Chronic Pain Syndrome: Refilled:  Hydrocodone 5/325 mg one tablet every 6 hours as needed for moderate pain #120. 02/20/2018.  30 minutesof face to face patient care time was spent during this visit. All questions were encouraged and answered.  F/U in 1 month

## 2018-02-27 DIAGNOSIS — L600 Ingrowing nail: Secondary | ICD-10-CM | POA: Diagnosis not present

## 2018-02-27 DIAGNOSIS — L603 Nail dystrophy: Secondary | ICD-10-CM | POA: Diagnosis not present

## 2018-02-27 DIAGNOSIS — M79675 Pain in left toe(s): Secondary | ICD-10-CM | POA: Diagnosis not present

## 2018-03-02 DIAGNOSIS — M1612 Unilateral primary osteoarthritis, left hip: Secondary | ICD-10-CM | POA: Diagnosis not present

## 2018-03-03 DIAGNOSIS — G5602 Carpal tunnel syndrome, left upper limb: Secondary | ICD-10-CM | POA: Diagnosis not present

## 2018-03-09 DIAGNOSIS — M7062 Trochanteric bursitis, left hip: Secondary | ICD-10-CM | POA: Diagnosis not present

## 2018-03-09 DIAGNOSIS — S32512A Fracture of superior rim of left pubis, initial encounter for closed fracture: Secondary | ICD-10-CM | POA: Diagnosis not present

## 2018-03-13 ENCOUNTER — Inpatient Hospital Stay: Payer: Medicare Other | Admitting: Hematology & Oncology

## 2018-03-13 ENCOUNTER — Inpatient Hospital Stay: Payer: Medicare Other | Attending: Hematology & Oncology

## 2018-03-13 ENCOUNTER — Encounter (HOSPITAL_BASED_OUTPATIENT_CLINIC_OR_DEPARTMENT_OTHER): Payer: Self-pay

## 2018-03-13 ENCOUNTER — Ambulatory Visit (HOSPITAL_BASED_OUTPATIENT_CLINIC_OR_DEPARTMENT_OTHER)
Admission: RE | Admit: 2018-03-13 | Discharge: 2018-03-13 | Disposition: A | Discharge status: Home / Self Care | Payer: Medicare Other | Source: Ambulatory Visit | Attending: Family | Admitting: Family

## 2018-03-13 DIAGNOSIS — I2510 Atherosclerotic heart disease of native coronary artery without angina pectoris: Secondary | ICD-10-CM | POA: Insufficient documentation

## 2018-03-13 DIAGNOSIS — I700 Atherosclerosis of aorta: Secondary | ICD-10-CM | POA: Insufficient documentation

## 2018-03-13 DIAGNOSIS — C50911 Malignant neoplasm of unspecified site of right female breast: Secondary | ICD-10-CM | POA: Insufficient documentation

## 2018-03-13 DIAGNOSIS — R599 Enlarged lymph nodes, unspecified: Secondary | ICD-10-CM | POA: Diagnosis not present

## 2018-03-13 DIAGNOSIS — Z170 Estrogen receptor positive status [ER+]: Secondary | ICD-10-CM | POA: Diagnosis not present

## 2018-03-13 DIAGNOSIS — R590 Localized enlarged lymph nodes: Secondary | ICD-10-CM | POA: Diagnosis not present

## 2018-03-13 DIAGNOSIS — Z853 Personal history of malignant neoplasm of breast: Secondary | ICD-10-CM | POA: Insufficient documentation

## 2018-03-13 LAB — CBC WITH DIFFERENTIAL (CANCER CENTER ONLY)
ABS IMMATURE GRANULOCYTES: 0.03 10*3/uL (ref 0.00–0.07)
BASOS ABS: 0 10*3/uL (ref 0.0–0.1)
BASOS PCT: 0 %
EOS ABS: 0.2 10*3/uL (ref 0.0–0.5)
Eosinophils Relative: 3 %
HCT: 41.5 % (ref 36.0–46.0)
Hemoglobin: 12.9 g/dL (ref 12.0–15.0)
IMMATURE GRANULOCYTES: 0 %
Lymphocytes Relative: 29 %
Lymphs Abs: 2.1 10*3/uL (ref 0.7–4.0)
MCH: 27.7 pg (ref 26.0–34.0)
MCHC: 31.1 g/dL (ref 30.0–36.0)
MCV: 89.2 fL (ref 80.0–100.0)
MONOS PCT: 7 %
Monocytes Absolute: 0.5 10*3/uL (ref 0.1–1.0)
NEUTROS ABS: 4.3 10*3/uL (ref 1.7–7.7)
NEUTROS PCT: 61 %
NRBC: 0 % (ref 0.0–0.2)
Platelet Count: 145 10*3/uL — ABNORMAL LOW (ref 150–400)
RBC: 4.65 MIL/uL (ref 3.87–5.11)
RDW: 13.7 % (ref 11.5–15.5)
WBC: 7.1 10*3/uL (ref 4.0–10.5)

## 2018-03-13 LAB — CMP (CANCER CENTER ONLY)
ALT: 47 U/L (ref 10–47)
ANION GAP: 0 — AB (ref 5–15)
AST: 39 U/L — AB (ref 11–38)
Albumin: 3.5 g/dL (ref 3.5–5.0)
Alkaline Phosphatase: 130 U/L — ABNORMAL HIGH (ref 26–84)
BUN: 18 mg/dL (ref 7–22)
CALCIUM: 9.8 mg/dL (ref 8.0–10.3)
CO2: 30 mmol/L (ref 18–33)
Chloride: 108 mmol/L (ref 98–108)
Creatinine: 0.5 mg/dL — ABNORMAL LOW (ref 0.60–1.20)
Glucose, Bld: 108 mg/dL (ref 73–118)
Potassium: 4 mmol/L (ref 3.3–4.7)
Sodium: 135 mmol/L (ref 128–145)
TOTAL PROTEIN: 6.8 g/dL (ref 6.4–8.1)
Total Bilirubin: 0.7 mg/dL (ref 0.2–1.6)

## 2018-03-13 MED ORDER — IOPAMIDOL (ISOVUE-300) INJECTION 61%
80.0000 mL | Freq: Once | INTRAVENOUS | Status: AC | PRN
  Administered 2018-03-13: 80 mL via INTRAVENOUS

## 2018-03-14 ENCOUNTER — Other Ambulatory Visit: Payer: Medicare Other

## 2018-03-14 ENCOUNTER — Ambulatory Visit: Payer: Medicare Other | Admitting: Hematology & Oncology

## 2018-03-14 LAB — CANCER ANTIGEN 27.29: CAN 27.29: 17.8 U/mL (ref 0.0–38.6)

## 2018-03-16 ENCOUNTER — Encounter: Payer: Medicare Other | Attending: Physical Medicine & Rehabilitation | Admitting: Registered Nurse

## 2018-03-16 ENCOUNTER — Encounter: Payer: Self-pay | Admitting: Registered Nurse

## 2018-03-16 VITALS — BP 137/79 | HR 68 | Ht 66.0 in | Wt 238.0 lb

## 2018-03-16 DIAGNOSIS — Z96651 Presence of right artificial knee joint: Secondary | ICD-10-CM | POA: Insufficient documentation

## 2018-03-16 DIAGNOSIS — Z85820 Personal history of malignant melanoma of skin: Secondary | ICD-10-CM | POA: Insufficient documentation

## 2018-03-16 DIAGNOSIS — Z9049 Acquired absence of other specified parts of digestive tract: Secondary | ICD-10-CM | POA: Diagnosis not present

## 2018-03-16 DIAGNOSIS — M7918 Myalgia, other site: Secondary | ICD-10-CM | POA: Insufficient documentation

## 2018-03-16 DIAGNOSIS — Z87891 Personal history of nicotine dependence: Secondary | ICD-10-CM | POA: Diagnosis not present

## 2018-03-16 DIAGNOSIS — Z853 Personal history of malignant neoplasm of breast: Secondary | ICD-10-CM | POA: Insufficient documentation

## 2018-03-16 DIAGNOSIS — M25532 Pain in left wrist: Secondary | ICD-10-CM

## 2018-03-16 DIAGNOSIS — M7061 Trochanteric bursitis, right hip: Secondary | ICD-10-CM | POA: Diagnosis not present

## 2018-03-16 DIAGNOSIS — I10 Essential (primary) hypertension: Secondary | ICD-10-CM | POA: Diagnosis not present

## 2018-03-16 DIAGNOSIS — Z9889 Other specified postprocedural states: Secondary | ICD-10-CM | POA: Insufficient documentation

## 2018-03-16 DIAGNOSIS — G4730 Sleep apnea, unspecified: Secondary | ICD-10-CM | POA: Diagnosis not present

## 2018-03-16 DIAGNOSIS — Z96643 Presence of artificial hip joint, bilateral: Secondary | ICD-10-CM | POA: Insufficient documentation

## 2018-03-16 DIAGNOSIS — M79604 Pain in right leg: Secondary | ICD-10-CM | POA: Insufficient documentation

## 2018-03-16 DIAGNOSIS — M5416 Radiculopathy, lumbar region: Secondary | ICD-10-CM

## 2018-03-16 DIAGNOSIS — Z9013 Acquired absence of bilateral breasts and nipples: Secondary | ICD-10-CM | POA: Diagnosis not present

## 2018-03-16 DIAGNOSIS — M8580 Other specified disorders of bone density and structure, unspecified site: Secondary | ICD-10-CM | POA: Diagnosis not present

## 2018-03-16 DIAGNOSIS — G8929 Other chronic pain: Secondary | ICD-10-CM | POA: Diagnosis not present

## 2018-03-16 DIAGNOSIS — K219 Gastro-esophageal reflux disease without esophagitis: Secondary | ICD-10-CM | POA: Insufficient documentation

## 2018-03-16 DIAGNOSIS — Z8489 Family history of other specified conditions: Secondary | ICD-10-CM | POA: Insufficient documentation

## 2018-03-16 DIAGNOSIS — M7062 Trochanteric bursitis, left hip: Secondary | ICD-10-CM | POA: Diagnosis not present

## 2018-03-16 DIAGNOSIS — R202 Paresthesia of skin: Secondary | ICD-10-CM | POA: Diagnosis not present

## 2018-03-16 DIAGNOSIS — E785 Hyperlipidemia, unspecified: Secondary | ICD-10-CM | POA: Insufficient documentation

## 2018-03-16 DIAGNOSIS — Z8673 Personal history of transient ischemic attack (TIA), and cerebral infarction without residual deficits: Secondary | ICD-10-CM | POA: Insufficient documentation

## 2018-03-16 DIAGNOSIS — M79605 Pain in left leg: Secondary | ICD-10-CM | POA: Insufficient documentation

## 2018-03-16 DIAGNOSIS — M1712 Unilateral primary osteoarthritis, left knee: Secondary | ICD-10-CM | POA: Diagnosis not present

## 2018-03-16 MED ORDER — HYDROCODONE-ACETAMINOPHEN 5-325 MG PO TABS: 1.0000 | ORAL_TABLET | Freq: Four times a day (QID) | ORAL | 0 refills | Status: DC | PRN

## 2018-03-16 NOTE — Progress Notes (Signed)
Subjective:    Patient ID: Crystal Lee, female    DOB: Sep 17, 1933, 84 y.o.   MRN: [id number]  HPI: Crystal Lee is a 84 year old female who returns for follow up appointment for chronic pain and medication refill. She states her pain is located in her left wrist, lower back radiating into her bilateral groin and bilateral lower extremities, also reports bilateral hip pain, left knee and left great toe pain.   She also states she was recently diagnosed with stress fracture, according to Dr. Wynelle Link note she was diagnosed with fracture of superior pubic ramus.  She rates her pain 7. Her current exercise regime is walking.   Also states on 03/03/2018 she had Carpal Tunnel Release Surgery on 03/03/2018 by Dr. Ellene Route. Dr. Ellene Route had wrote a prescription for [facility] 5mg /355 one tablet every 4-6 hrs #40. She has been using the hydrocodone every 4 hours, she brought in Dr. Ellene Route prescription, she was educated on the Narcotic Policy and call the office prior to any change to her current prescription. She verbalizes understanding. She will resume her current dose every 6 hours, she verbalizes understanding.   This provider placed a call to [facility] and spoke with Pharmacist regarding the above and instructed to fill prescription today.   Crystal Lee Morphine Equivalent is 20.00 MME. Last oral swab was performed on 12/23/2017, it was consistent.    Pain Inventory Average Pain 7 Pain Right Now 7 My pain is burning  In the last 24 hours, has pain interfered with the following? General activity 0 Relation with others 0 Enjoyment of life 0 What TIME of day is your pain at its worst? daytime, evening  Sleep (in general) Poor  Pain is worse with: walking, standing and some activites Pain improves with: rest and medication Relief from Meds: 6  Mobility walk with assistance use a cane use a walker ability to climb steps?  yes do you drive?  yes  Function not employed: date last employed  . I need assistance with the following:  household duties  Neuro/Psych bladder control problems numbness trouble walking  Prior Studies Any changes since last visit?  no  Physicians involved in your care Any changes since last visit?  no   Family History  Problem Relation Age of Onset  . Other Mother        complications from flu  . Other Father        unsure of cause   Social History   Socioeconomic History  . Marital status: Married    Spouse name: Not on file  . Number of children: 3  . Years of education: 16 years  . Highest education level: Not on file  Occupational History  . Occupation: Retired  Scientific laboratory technician  . Financial resource strain: Not on file  . Food insecurity:    Worry: Not on file    Inability: Not on file  . Transportation needs:    Medical: Not on file    Non-medical: Not on file  Tobacco Use  . Smoking status: Former Smoker    Last attempt to quit: 02/03/1976    Years since quitting: 42.1  . Smokeless tobacco: Never Used  Substance and Sexual Activity  . Alcohol use: No  . Drug use: No  . Sexual activity: Yes    Birth control/protection: Post-menopausal  Lifestyle  . Physical activity:    Days per week: Not on file    Minutes per session: Not on file  .  Stress: Not on file  Relationships  . Social connections:    Talks on phone: Not on file    Gets together: Not on file    Attends religious service: Not on file    Active member of club or organization: Not on file    Attends meetings of clubs or organizations: Not on file    Relationship status: Not on file  Other Topics Concern  . Not on file  Social History Narrative   Lives at home with husband.   Right-handed.   No caffeine use.   Past Surgical History:  Procedure Laterality Date  . ABDOMINAL HYSTERECTOMY  2010  . APPENDECTOMY  1949  . [place]  . BREAST SURGERY    . CATARACT EXTRACTION Bilateral 2011  . CHOLECYSTECTOMY    . GALLBLADDER SURGERY  2015  .  JOINT REPLACEMENT     RT TOTAL HIP / RT TOTAL KNEE  . MASTECTOMY  1998   BILATERAL   . SKIN CANCER EXCISION  2016   RT SIDE OF NOSE  . TONSILLECTOMY  1953  . TOTAL HIP ARTHROPLASTY  2010   RIGHT  . TOTAL HIP ARTHROPLASTY Left 05/09/2015   Procedure: LEFT TOTAL HIP ARTHROPLASTY ANTERIOR APPROACH;  Surgeon: Gaynelle Arabian, MD;  Location[facility];  Service: Orthopedics;  Laterality: Left;  . TOTAL KNEE ARTHROPLASTY  2001  . TUMOR REMOVAL  2012   ABDOMINAL - NON CANCEROUS   Past Medical History:  Diagnosis Date  . Arthritis   . Cancer (HCC)    HX BREAST CANCER/ SKIN CANCER  . Complication of anesthesia    N/V WITH MORPHINE  . Difficulty sleeping   . Fractured hip ([place])    LEFT - AUG 2016  . GERD (gastroesophageal reflux disease)   . Hyperlipidemia   . Hypertension   . Melanoma ([place])   . Neuropathy   . Nocturia   . Osteopenia   . 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 PONV (postoperative nausea and vomiting)    PT STATES MORPHINE CAUSED N/V  . Rosacea   . Sleep apnea   . Stage 1 breast cancer, ER+, right (HCC) 07/04/2017   BP 137/79   Pulse 68   Ht 5\' 6"  (1.676 m)   Wt 238 lb (108 kg)   SpO2 93%   BMI 38.41 kg/m   Opioid Risk Score:   Fall Risk Score:  `1  Depression screen PHQ 2/9  Depression screen [facility] 2/9 02/20/2018 10/17/2017  Decreased Interest 0 0  Down, Depressed, Hopeless 0 0  PHQ - 2 Score 0 0  Altered sleeping - 3  Tired, decreased energy - 3  Change in appetite - 0  Feeling bad or failure about yourself  - 0  Trouble concentrating - 0  Moving slowly or fidgety/restless - 0  Suicidal thoughts - 0  PHQ-9 Score - 6  Difficult doing work/chores - Somewhat difficult    Review of Systems  Constitutional: Negative.   HENT: Negative.   Eyes: Negative.   Respiratory: Negative.   Cardiovascular: Positive for leg swelling.  Gastrointestinal: Negative.   Endocrine: Negative.   Genitourinary: Positive for difficulty urinating.   Musculoskeletal: Positive for back pain.  Allergic/Immunologic: Negative.   Neurological: Positive for numbness.  Hematological: Negative.        Objective:   Physical Exam  Constitutional: She is oriented to person, place, and time. She appears well-developed and well-nourished.  HENT:  Head: Normocephalic and atraumatic.  Neck:  Normal range of motion. Neck supple.  Cardiovascular: Normal rate and regular rhythm.  Pulmonary/Chest: Effort normal and breath sounds normal.  Musculoskeletal:  Normal Muscle Bulk and Muscle Testing Reveals: Upper Extremities: Full ROM and Muscle Strength 5/5 Lumbar Paraspinal Tenderness: L-3-L-5 Bilateral Greater Trochanter tenderness Lower Extremities: Full ROM and Muscle Strength 5/5 Arises from chair slowly using walker for support Antalgic gait  Neurological: She is alert and oriented to person, place, and time.  Skin: Skin is warm and dry.  Psychiatric: She has a normal mood and affect. Her behavior is normal.  Nursing note and vitals reviewed.         Assessment & Plan:  1. Chronic Bilateral Leg pain: Paresthesia: Continue Lyrica. Continue to Monitor. 03/16/2018 2. Lumbar Radiculitis: Continue Lyrica. 03/16/2018 3. Acute Pain of Left Wrist/ S/P Carpal Tunnel Release on 03/03/2018 by Dr. Ellene Route. Dr. Ellene Route Following. 03/13/2018. 4. Fracture of superior pubic ramus.  Dr. Wynelle Link Following.  Continue to monitor. 03/16/2018. 3. Left Knee OA: Continue Voltaren Gel. Continue to monitor. 03/16/2018. 4. Polyarthralgia: Continue to alternate with heat and ice therapy. Continue current medication regime. Continue to monitor. 03/16/2018. 5. Chronic Pain Syndrome: Refilled:  Hydrocodone 5/325 mg one tablet every 6 hours as needed for moderate pain #120. 03/16/2018.  40 minutesof face to face patient care time was spent during this visit. All questions were encouraged and answered.  F/U in 1 month

## 2018-03-21 DIAGNOSIS — C44311 Basal cell carcinoma of skin of nose: Secondary | ICD-10-CM | POA: Diagnosis not present

## 2018-03-21 DIAGNOSIS — D225 Melanocytic nevi of trunk: Secondary | ICD-10-CM | POA: Diagnosis not present

## 2018-03-21 DIAGNOSIS — L821 Other seborrheic keratosis: Secondary | ICD-10-CM | POA: Diagnosis not present

## 2018-03-21 DIAGNOSIS — D1801 Hemangioma of skin and subcutaneous tissue: Secondary | ICD-10-CM | POA: Diagnosis not present

## 2018-03-21 DIAGNOSIS — D485 Neoplasm of uncertain behavior of skin: Secondary | ICD-10-CM | POA: Diagnosis not present

## 2018-03-22 ENCOUNTER — Ambulatory Visit: Payer: Medicare Other | Admitting: Registered Nurse

## 2018-04-10 ENCOUNTER — Ambulatory Visit: Payer: Medicare Other | Admitting: Hematology & Oncology

## 2018-04-10 ENCOUNTER — Other Ambulatory Visit: Payer: Self-pay

## 2018-04-10 ENCOUNTER — Inpatient Hospital Stay: Payer: Medicare Other

## 2018-04-10 ENCOUNTER — Encounter: Payer: Self-pay | Admitting: Registered Nurse

## 2018-04-10 ENCOUNTER — Encounter: Payer: Medicare Other | Attending: Physical Medicine & Rehabilitation | Admitting: Registered Nurse

## 2018-04-10 VITALS — BP 147/81 | HR 94 | Ht 66.0 in | Wt 241.6 lb

## 2018-04-10 DIAGNOSIS — K219 Gastro-esophageal reflux disease without esophagitis: Secondary | ICD-10-CM | POA: Insufficient documentation

## 2018-04-10 DIAGNOSIS — G894 Chronic pain syndrome: Secondary | ICD-10-CM | POA: Diagnosis not present

## 2018-04-10 DIAGNOSIS — Z5181 Encounter for therapeutic drug level monitoring: Secondary | ICD-10-CM

## 2018-04-10 DIAGNOSIS — M1712 Unilateral primary osteoarthritis, left knee: Secondary | ICD-10-CM

## 2018-04-10 DIAGNOSIS — Z85820 Personal history of malignant melanoma of skin: Secondary | ICD-10-CM | POA: Diagnosis not present

## 2018-04-10 DIAGNOSIS — Z96643 Presence of artificial hip joint, bilateral: Secondary | ICD-10-CM | POA: Diagnosis not present

## 2018-04-10 DIAGNOSIS — M79605 Pain in left leg: Secondary | ICD-10-CM | POA: Diagnosis not present

## 2018-04-10 DIAGNOSIS — M8580 Other specified disorders of bone density and structure, unspecified site: Secondary | ICD-10-CM | POA: Diagnosis not present

## 2018-04-10 DIAGNOSIS — Z8673 Personal history of transient ischemic attack (TIA), and cerebral infarction without residual deficits: Secondary | ICD-10-CM | POA: Insufficient documentation

## 2018-04-10 DIAGNOSIS — Z8489 Family history of other specified conditions: Secondary | ICD-10-CM | POA: Insufficient documentation

## 2018-04-10 DIAGNOSIS — Z853 Personal history of malignant neoplasm of breast: Secondary | ICD-10-CM | POA: Insufficient documentation

## 2018-04-10 DIAGNOSIS — Z87891 Personal history of nicotine dependence: Secondary | ICD-10-CM | POA: Diagnosis not present

## 2018-04-10 DIAGNOSIS — Z9013 Acquired absence of bilateral breasts and nipples: Secondary | ICD-10-CM | POA: Diagnosis not present

## 2018-04-10 DIAGNOSIS — I10 Essential (primary) hypertension: Secondary | ICD-10-CM | POA: Insufficient documentation

## 2018-04-10 DIAGNOSIS — M7061 Trochanteric bursitis, right hip: Secondary | ICD-10-CM | POA: Diagnosis not present

## 2018-04-10 DIAGNOSIS — M7062 Trochanteric bursitis, left hip: Secondary | ICD-10-CM

## 2018-04-10 DIAGNOSIS — G8929 Other chronic pain: Secondary | ICD-10-CM | POA: Diagnosis not present

## 2018-04-10 DIAGNOSIS — G4730 Sleep apnea, unspecified: Secondary | ICD-10-CM | POA: Diagnosis not present

## 2018-04-10 DIAGNOSIS — Z9889 Other specified postprocedural states: Secondary | ICD-10-CM | POA: Insufficient documentation

## 2018-04-10 DIAGNOSIS — Z79899 Other long term (current) drug therapy: Secondary | ICD-10-CM

## 2018-04-10 DIAGNOSIS — E785 Hyperlipidemia, unspecified: Secondary | ICD-10-CM | POA: Insufficient documentation

## 2018-04-10 DIAGNOSIS — M5416 Radiculopathy, lumbar region: Secondary | ICD-10-CM

## 2018-04-10 DIAGNOSIS — M62838 Other muscle spasm: Secondary | ICD-10-CM | POA: Diagnosis not present

## 2018-04-10 DIAGNOSIS — M2550 Pain in unspecified joint: Secondary | ICD-10-CM

## 2018-04-10 DIAGNOSIS — R202 Paresthesia of skin: Secondary | ICD-10-CM | POA: Diagnosis not present

## 2018-04-10 DIAGNOSIS — Z9049 Acquired absence of other specified parts of digestive tract: Secondary | ICD-10-CM | POA: Insufficient documentation

## 2018-04-10 DIAGNOSIS — M7918 Myalgia, other site: Secondary | ICD-10-CM | POA: Insufficient documentation

## 2018-04-10 DIAGNOSIS — M79604 Pain in right leg: Secondary | ICD-10-CM | POA: Insufficient documentation

## 2018-04-10 DIAGNOSIS — Z96651 Presence of right artificial knee joint: Secondary | ICD-10-CM | POA: Diagnosis not present

## 2018-04-10 MED ORDER — HYDROCODONE-ACETAMINOPHEN 7.5-325 MG PO TABS: 1.0000 | ORAL_TABLET | Freq: Four times a day (QID) | ORAL | 0 refills | Status: DC | PRN

## 2018-04-10 NOTE — Progress Notes (Signed)
Subjective:    Patient ID: Crystal Lee, female    DOB: 19-Apr-1934, 84 y.o.   MRN: [id number]  HPI: [name] is a 84 year old female who returns for follow up appointment for chronic pain and medication refill. She states she has been experiencing increase intensity of hip pain that radiating into her groins L>R, and bilateral knee pain. Also reports burning and tingling into her bilateral lower extremities. 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 This has caused her increase intensity of pain, she has spoken  with [facility] regarding providing an aide for her husband, she's still waiting she states.  [name] reports with the increase intensity of hip pain the hydrocodone only offers 4 hours of relief of her pain. Educated on the narcotic policy, she admits she had taken an extra hydrocodone when the pain was severe, she didn't call the office. Educated on self medicating, she realizes if this occurs again she will be discharged and verbalizes understanding. Also instructed to call office with increase intensity of pain, she verbalizes understanding. She has an appointment with her orthopedist Dr. Wynelle Lee she reports.   [name] Morphine Equivalent is 20.00 MME. Last Oral Swab was Performed on 12/23/2017 it was consistent.   Pain Inventory Average Pain 8 Pain Right Now 8 My pain is constant  In the last 24 hours, has pain interfered with the following? General activity 10 Relation with others 10 Enjoyment of life 10 What TIME of day is your pain at its worst? all Sleep (in general) Poor  Pain is worse with: walking and standing Pain improves with: rest and medication Relief from Meds: 5  Mobility use a walker ability to climb steps?  no do you drive?  yes  Function retired I need assistance with the following:  household duties and shopping  Neuro/Psych bladder control problems  Prior Studies Any changes since last visit?   no  Physicians involved in your care Any changes since last visit?  no Orthopedist Frank Alluisio   Family History  Problem Relation Age of Onset  . Other Mother        complications from flu  . Other Father        unsure of cause   Social History   Socioeconomic History  . Marital status: Married    Spouse name: Not on file  . Number of children: 3  . Years of education: 16 years  . Highest education level: Not on file  Occupational History  . Occupation: Retired  Scientific laboratory technician  . Financial resource strain: Not on file  . Food insecurity:    Worry: Not on file    Inability: Not on file  . Transportation needs:    Medical: Not on file    Non-medical: Not on file  Tobacco Use  . Smoking status: Former Smoker    Last attempt to quit: 02/03/1976    Years since quitting: 42.2  . Smokeless tobacco: Never Used  Substance and Sexual Activity  . Alcohol use: No  . Drug use: No  . Sexual activity: Yes    Birth control/protection: Post-menopausal  Lifestyle  . Physical activity:    Days per week: Not on file    Minutes per session: Not on file  . Stress: Not on file  Relationships  . Social connections:    Talks on phone: Not on file    Gets together: Not on file    Attends religious  service: Not on file    Active member of club or organization: Not on file    Attends meetings of clubs or organizations: Not on file    Relationship status: Not on file  Other Topics Concern  . Not on file  Social History Narrative   Lives at home with husband.   Right-handed.   No caffeine use.   Past Surgical History:  Procedure Laterality Date  . ABDOMINAL HYSTERECTOMY  2010  . APPENDECTOMY  1949  . [place]  . BREAST SURGERY    . CATARACT EXTRACTION Bilateral 2011  . CHOLECYSTECTOMY    . GALLBLADDER SURGERY  2015  . JOINT REPLACEMENT     RT TOTAL HIP / RT TOTAL KNEE  . MASTECTOMY  1998   BILATERAL   . SKIN CANCER EXCISION  2016   RT SIDE OF NOSE  .  TONSILLECTOMY  1953  . TOTAL HIP ARTHROPLASTY  2010   RIGHT  . TOTAL HIP ARTHROPLASTY Left 05/09/2015   Procedure: LEFT TOTAL HIP ARTHROPLASTY ANTERIOR APPROACH;  Surgeon: Gaynelle Arabian, MD;  Location: [facility];  Service: Orthopedics;  Laterality: Left;  . TOTAL KNEE ARTHROPLASTY  2001  . TUMOR REMOVAL  2012   ABDOMINAL - NON CANCEROUS   Past Medical History:  Diagnosis Date  . Arthritis   . Cancer (HCC)    HX BREAST CANCER/ SKIN CANCER  . Complication of anesthesia    N/V WITH MORPHINE  . Difficulty sleeping   . Fractured hip ([place])    LEFT - AUG 2016  . GERD (gastroesophageal reflux disease)   . Hyperlipidemia   . Hypertension   . Melanoma ([place])   . Neuropathy   . Nocturia   . Osteopenia   . 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 PONV (postoperative nausea and vomiting)    PT STATES MORPHINE CAUSED N/V  . Rosacea   . Sleep apnea   . Stage 1 breast cancer, ER+, right ([place]) 07/04/2017   BP (!) 147/81   Pulse 94   Ht 5\' 6"  (1.676 m) Comment: reported  Wt 241 lb 9.6 oz (109.6 kg)   SpO2 93%   BMI 39.00 kg/m   Opioid Risk Score:   Fall Risk Score:  `1  Depression screen PHQ 2/9  Depression screen [facility] 2/9 04/10/2018 02/20/2018 10/17/2017  Decreased Interest 0 0 0  Down, Depressed, Hopeless 0 0 0  PHQ - 2 Score 0 0 0  Altered sleeping - - 3  Tired, decreased energy - - 3  Change in appetite - - 0  Feeling bad or failure about yourself  - - 0  Trouble concentrating - - 0  Moving slowly or fidgety/restless - - 0  Suicidal thoughts - - 0  PHQ-9 Score - - 6  Difficult doing work/chores - - Somewhat difficult    Review of Systems  Constitutional: Negative.   HENT: Negative.   Eyes: Negative.   Respiratory: Negative.   Cardiovascular: Negative.   Gastrointestinal: Positive for constipation.  Endocrine: Negative.   Genitourinary:       Bladder control  Musculoskeletal: Negative.   Skin: Negative.   Allergic/Immunologic: Negative.   Neurological:  Negative.   Hematological: Negative.   Psychiatric/Behavioral: Negative.        Objective:   Physical Exam  Constitutional: She is oriented to person, place, and time. She appears well-developed and well-nourished.  HENT:  Head: Normocephalic and atraumatic.  Neck: Normal range of motion. Neck supple.  Cardiovascular: Normal rate and regular rhythm.  Pulmonary/Chest: Effort normal and breath sounds normal.  Musculoskeletal:  Normal Muscle Bulk and Muscle Testing Reveals:  Upper Extremities: Full ROM and Muscle Strength 5/5 Lumbar Paraspinal Tenderness: L-3-L-5 Bilateral Greater Trochanter Tenderness Lower Extremities: Decreased ROM and Muscle Strength 4/5 Bilateral Lower Extremities Flexion Produces Pain into Bilateral Lower Extremities Arises from chair slowly using walker for support Antalgic gait  Neurological: She is alert and oriented to person, place, and time.  Skin: Skin is warm and dry.  Psychiatric: She has a normal mood and affect. Her behavior is normal.  Nursing note and vitals reviewed.         Assessment & Plan:  1. Chronic Bilateral Leg pain: Paresthesia: Continue Lyrica. Continue to Monitor.04/10/2018 2. Lumbar Radiculitis: Continue Lyrica. 04/10/2018 3. Acute Pain of Left Wrist/ : No complaints Today: S/P Carpal Tunnel Release on 06/03/2017 by Dr. Ellene Route. Dr. Ellene Route Following. 04/10/2018. 4. Fracture of superior pubic ramus.  Dr. Wynelle Lee Following.  Continue to monitor. 04/10/2018. 3. Left Knee OA: Continue Voltaren Gel. No complaints today. Continue to monitor.04/10/2018. 4. Polyarthralgia: Continue to alternate with heat and ice therapy. Continue current medication regime. Continue to monitor.04/10/2018. 5. Chronic Pain Syndrome: Refilled: Increased: Hydrocodone 7.5/325 mg one tablet every 6 hours as needed for moderate pain #120.04/10/2018.  30 minutesof face to face patient care time was spent during this visit. All questions were encouraged and  answered.  F/U in 1 month

## 2018-04-10 NOTE — Patient Instructions (Addendum)
Hydrocodone Dosage was increased today: Only Take your medication 4 times a day ONLY!!!!!!!! If you have any problems or increase intensity of Pain Call Office: Don't take more medication without calling office.   Call Office Next week to evaluate medication change:   [phone]

## 2018-04-11 ENCOUNTER — Telehealth: Payer: Self-pay | Admitting: *Deleted

## 2018-04-14 ENCOUNTER — Ambulatory Visit (HOSPITAL_BASED_OUTPATIENT_CLINIC_OR_DEPARTMENT_OTHER)
Admission: RE | Admit: 2018-04-14 | Discharge: 2018-04-14 | Disposition: A | Discharge status: Home / Self Care | Payer: Medicare Other | Source: Ambulatory Visit | Attending: Family Medicine | Admitting: Family Medicine

## 2018-04-14 DIAGNOSIS — E2839 Other primary ovarian failure: Secondary | ICD-10-CM | POA: Insufficient documentation

## 2018-04-14 DIAGNOSIS — M8589 Other specified disorders of bone density and structure, multiple sites: Secondary | ICD-10-CM | POA: Insufficient documentation

## 2018-04-14 DIAGNOSIS — Z13820 Encounter for screening for osteoporosis: Secondary | ICD-10-CM | POA: Insufficient documentation

## 2018-04-14 DIAGNOSIS — Z780 Asymptomatic menopausal state: Secondary | ICD-10-CM | POA: Diagnosis not present

## 2018-04-15 ENCOUNTER — Encounter: Payer: Self-pay | Admitting: Family Medicine

## 2018-04-15 DIAGNOSIS — M8580 Other specified disorders of bone density and structure, unspecified site: Secondary | ICD-10-CM | POA: Insufficient documentation

## 2018-04-17 ENCOUNTER — Ambulatory Visit: Payer: Medicare Other | Admitting: Registered Nurse

## 2018-04-19 ENCOUNTER — Other Ambulatory Visit: Payer: Self-pay

## 2018-04-19 ENCOUNTER — Encounter: Payer: Self-pay | Admitting: Hematology & Oncology

## 2018-04-19 ENCOUNTER — Telehealth: Payer: Self-pay | Admitting: Hematology & Oncology

## 2018-04-19 ENCOUNTER — Inpatient Hospital Stay: Payer: Medicare Other | Attending: Hematology & Oncology | Admitting: Hematology & Oncology

## 2018-04-19 VITALS — BP 136/55 | HR 73 | Temp 98.1°F | Resp 17 | Wt 242.0 lb

## 2018-04-19 DIAGNOSIS — K219 Gastro-esophageal reflux disease without esophagitis: Secondary | ICD-10-CM

## 2018-04-19 DIAGNOSIS — C50911 Malignant neoplasm of unspecified site of right female breast: Secondary | ICD-10-CM

## 2018-04-19 DIAGNOSIS — M818 Other osteoporosis without current pathological fracture: Secondary | ICD-10-CM

## 2018-04-19 DIAGNOSIS — Z170 Estrogen receptor positive status [ER+]: Secondary | ICD-10-CM

## 2018-04-19 DIAGNOSIS — T386X5A Adverse effect of antigonadotrophins, antiestrogens, antiandrogens, not elsewhere classified, initial encounter: Secondary | ICD-10-CM

## 2018-04-19 DIAGNOSIS — Z853 Personal history of malignant neoplasm of breast: Secondary | ICD-10-CM | POA: Diagnosis not present

## 2018-04-19 MED ORDER — ERGOCALCIFEROL 1.25 MG (50000 UT) PO CAPS: 50000.0000 [IU] | ORAL_CAPSULE | ORAL | 6 refills | Status: DC

## 2018-04-19 NOTE — Progress Notes (Signed)
Hematology and Oncology Follow Up Visit  Crystal Lee [id number] 11-20-1933 84 y.o. 04/19/2018   Principle Diagnosis:  Bilateral stage Ia breast cancer-1998; thoracic adenopathy of undetermined significance  Current Therapy:   Observation   Interim History:  Crystal Lee is here today for follow-up.  She seems to be doing fairly well.  She now has a new family doctor that she is happy with.  She recently had a bone density test done.  I looked at the results.  It looks like it might be some osteopenia.  She is not taking vitamin D.  I will go ahead and send in weekly vitamin D for her.  She has been taking the weekly 50,000 unit dose but had been changed to a daily dose which she was not happy with.  More importantly, is that we did a CT scan.  This, thankfully, did not show any evidence of growth of lymph nodes.  There is no new areas of lymphadenopathy.  She apparently has renal cysts.  She apparently has seen urology in the past for this.  She really does not want to go back to [facility].  She wants to have a urologist up here.  I told her that I am sure that her family doctor will look into this.  She also needs to see a gastroenterologist.  Somehow, she thinks that I will be the one to refer her to all of these doctors.  I will see about referring her to the new gastroenterologist in our building.  She still has some back issues.  There is been no rashes.  She has had no fever.  Her husband still is not doing all that well.  Overall, her performance status is [name].     Medications:  Allergies as of 04/19/2018      Reactions   Morphine And Related Nausea And Vomiting      Medication List        Accurate as of 04/19/18  2:35 PM. Always use your most recent med list.          atorvastatin 20 MG tablet Commonly known as:  LIPITOR Take 20 mg by mouth daily.   baclofen 10 MG tablet Commonly known as:  LIORESAL Take 0.5-1 tablets (5-10 mg total) by mouth at  bedtime as needed for muscle spasms.   CALCIUM PO Take 1,200 mg by mouth daily.   diclofenac sodium 1 % Gel Commonly known as:  VOLTAREN Apply 1 application topically 3 (three) times daily. To feet, ankles, and knees.   gi cocktail Susp suspension Take 10 mLs by mouth 2 (two) times daily as needed for indigestion. Shake well.   HYDROcodone-acetaminophen 7.5-325 MG tablet Commonly known as:  NORCO Take 1 tablet by mouth every 6 (six) hours as needed for moderate pain. Change in Dosage: Please fill today   LINZESS 290 MCG Caps capsule Generic drug:  linaclotide Take 1 capsule by mouth daily.   losartan 25 MG tablet Commonly known as:  COZAAR Take 25 mg by mouth daily.   ondansetron 4 MG disintegrating tablet Commonly known as:  ZOFRAN-ODT as needed.   pregabalin 100 MG capsule Commonly known as:  LYRICA Take 1 capsule (100 mg total) by mouth 3 (three) times daily.   PREMARIN vaginal cream Generic drug:  conjugated estrogens Place 0.625 mg vaginally daily.   Vitamin D 50 MCG (2000 UT) tablet Take 2,000 Units by mouth.       Allergies:  Allergies  Allergen Reactions  .  Morphine And Related Nausea And Vomiting    Past Medical History, Surgical history, Social history, and Family History were reviewed and updated.  Review of Systems: All other 10 point review of systems is negative.   Physical Exam:  weight is 242 lb (109.8 kg). Her oral temperature is 98.1 F (36.7 C). Her blood pressure is 136/55 (abnormal) and her pulse is 73. Her respiration is 17 and oxygen saturation is 99%.   Wt Readings from Last 3 Encounters:  04/19/18 242 lb (109.8 kg)  04/10/18 241 lb 9.6 oz (109.6 kg)  03/16/18 238 lb (108 kg)    Ocular: Sclerae unicteric, pupils equal, round and reactive to light Ear-nose-throat: Oropharynx clear, dentition fair Lymphatic: No cervical, supraclavicular or axillary adenopathy Lungs no rales or rhonchi, good excursion bilaterally Heart regular  rate and rhythm, no murmur appreciated Abd soft, nontender, positive bowel sounds, no liver or spleen tip palpated on exam, no fluid wave  MSK no focal spinal tenderness, no joint edema Neuro: non-focal, well-oriented, appropriate affect Breasts: Deferred this visit, no changes per patient  Lab Results  Component Value Date   WBC 7.1 03/13/2018   HGB 12.9 03/13/2018   HCT 41.5 03/13/2018   MCV 89.2 03/13/2018   PLT 145 (L) 03/13/2018   Lab Results  Component Value Date   FERRITIN 131 02/09/2017   Lab Results  Component Value Date   RBC 4.65 03/13/2018   No results found for: [name], [facility] Lab Results  Component Value Date   IGGSERUM 1,085 02/09/2017   IGMSERUM 336 (H) 02/09/2017   No results found for: [name], SPEI   Chemistry      Component Value Date/Time   NA 135 03/13/2018 1358   NA 142 02/09/2017 1039   K 4.0 03/13/2018 1358   CL 108 03/13/2018 1358   CO2 30 03/13/2018 1358   BUN 18 03/13/2018 1358   BUN 13 02/09/2017 1039   CREATININE 0.50 (L) 03/13/2018 1358      Component Value Date/Time   CALCIUM 9.8 03/13/2018 1358   ALKPHOS 130 (H) 03/13/2018 1358   AST 39 (H) 03/13/2018 1358   ALT 47 03/13/2018 1358   BILITOT 0.7 03/13/2018 1358      Impression and Plan: Crystal Lee is a very pleasant 84 yo caucasian female with history of bilateral stage Ia breast cancer diagnosed back in 1998.   So far she has done well and there has been no evidence of recurrence.   I will set up another CT scan in 6 months.  I will make sure that we do both the chest and the abdomen and pelvis so that we will get the kidneys in.  Hopefully, the vitamin D will help her.  We will see about the gastroenterology referral.  I just do not see an issue with these lymph nodes.  I think that they are reactive.  They may be autoimmune.  I do not believe that they reflect breast cancer.  Her last CA  27.29 in October was 18.   [name], MD 11/20/20192:35 PM

## 2018-04-19 NOTE — Telephone Encounter (Signed)
Referral place through [place].  [facility] will contact patient to schedule appointment

## 2018-04-20 ENCOUNTER — Telehealth: Payer: Self-pay

## 2018-04-20 ENCOUNTER — Telehealth: Payer: Self-pay | Admitting: Hematology & Oncology

## 2018-04-20 ENCOUNTER — Ambulatory Visit: Payer: Medicare Other | Admitting: Registered Nurse

## 2018-04-20 DIAGNOSIS — M79642 Pain in left hand: Secondary | ICD-10-CM | POA: Insufficient documentation

## 2018-04-20 HISTORY — DX: Pain in left hand: M79.642

## 2018-04-20 NOTE — Telephone Encounter (Signed)
Appointments scheduled unable to [facility] for patient with change in her schedule for 09/2018. Letter/calendar mailed per 11/20 los

## 2018-04-20 NOTE — Telephone Encounter (Signed)
Patient called today for Crystal Lee in reguards to a medication check 1 week after previous appointment.  Call Office Next week to evaluate medication change:   [phone]

## 2018-04-20 NOTE — Telephone Encounter (Signed)
Return Crystal Lee call, no answer. Voicemail not set up, will await her return call.

## 2018-04-21 NOTE — Telephone Encounter (Signed)
Crystal Lee called  Today.  Returning phone call from yesterday. I contacted her. She reports medication change is helping a little better but not quit as good as she hoped.  She is taking as prescribed.  She reports Dr. Wynelle Link appt is December 5th at 9 AM. She follows up with Danella Sensing, ANP at 1 PM on the same day

## 2018-04-24 NOTE — Telephone Encounter (Signed)
Formal warning letter written and mailed for taking more medication than prescribed without permission from provider.

## 2018-04-26 ENCOUNTER — Encounter: Payer: Self-pay | Admitting: Gastroenterology

## 2018-05-02 ENCOUNTER — Ambulatory Visit: Payer: Medicare Other | Admitting: Gastroenterology

## 2018-05-04 ENCOUNTER — Encounter: Payer: Self-pay | Admitting: Registered Nurse

## 2018-05-04 ENCOUNTER — Encounter: Payer: Medicare Other | Attending: Physical Medicine & Rehabilitation | Admitting: Registered Nurse

## 2018-05-04 VITALS — BP 137/81 | HR 92 | Ht 66.0 in | Wt 235.0 lb

## 2018-05-04 DIAGNOSIS — G894 Chronic pain syndrome: Secondary | ICD-10-CM | POA: Diagnosis not present

## 2018-05-04 DIAGNOSIS — Z96651 Presence of right artificial knee joint: Secondary | ICD-10-CM | POA: Insufficient documentation

## 2018-05-04 DIAGNOSIS — M8580 Other specified disorders of bone density and structure, unspecified site: Secondary | ICD-10-CM | POA: Diagnosis not present

## 2018-05-04 DIAGNOSIS — Z79899 Other long term (current) drug therapy: Secondary | ICD-10-CM

## 2018-05-04 DIAGNOSIS — Z9013 Acquired absence of bilateral breasts and nipples: Secondary | ICD-10-CM | POA: Diagnosis not present

## 2018-05-04 DIAGNOSIS — Z8489 Family history of other specified conditions: Secondary | ICD-10-CM | POA: Diagnosis not present

## 2018-05-04 DIAGNOSIS — E785 Hyperlipidemia, unspecified: Secondary | ICD-10-CM | POA: Insufficient documentation

## 2018-05-04 DIAGNOSIS — M7918 Myalgia, other site: Secondary | ICD-10-CM | POA: Diagnosis not present

## 2018-05-04 DIAGNOSIS — Z9889 Other specified postprocedural states: Secondary | ICD-10-CM | POA: Diagnosis not present

## 2018-05-04 DIAGNOSIS — Z853 Personal history of malignant neoplasm of breast: Secondary | ICD-10-CM | POA: Diagnosis not present

## 2018-05-04 DIAGNOSIS — M62838 Other muscle spasm: Secondary | ICD-10-CM | POA: Diagnosis not present

## 2018-05-04 DIAGNOSIS — Z9049 Acquired absence of other specified parts of digestive tract: Secondary | ICD-10-CM | POA: Insufficient documentation

## 2018-05-04 DIAGNOSIS — M79605 Pain in left leg: Secondary | ICD-10-CM | POA: Diagnosis not present

## 2018-05-04 DIAGNOSIS — K219 Gastro-esophageal reflux disease without esophagitis: Secondary | ICD-10-CM | POA: Diagnosis not present

## 2018-05-04 DIAGNOSIS — Z85820 Personal history of malignant melanoma of skin: Secondary | ICD-10-CM | POA: Diagnosis not present

## 2018-05-04 DIAGNOSIS — G8929 Other chronic pain: Secondary | ICD-10-CM | POA: Insufficient documentation

## 2018-05-04 DIAGNOSIS — I10 Essential (primary) hypertension: Secondary | ICD-10-CM | POA: Diagnosis not present

## 2018-05-04 DIAGNOSIS — Z5181 Encounter for therapeutic drug level monitoring: Secondary | ICD-10-CM

## 2018-05-04 DIAGNOSIS — Z96643 Presence of artificial hip joint, bilateral: Secondary | ICD-10-CM | POA: Insufficient documentation

## 2018-05-04 DIAGNOSIS — Z8673 Personal history of transient ischemic attack (TIA), and cerebral infarction without residual deficits: Secondary | ICD-10-CM | POA: Diagnosis not present

## 2018-05-04 DIAGNOSIS — M5416 Radiculopathy, lumbar region: Secondary | ICD-10-CM | POA: Diagnosis not present

## 2018-05-04 DIAGNOSIS — R202 Paresthesia of skin: Secondary | ICD-10-CM

## 2018-05-04 DIAGNOSIS — G4730 Sleep apnea, unspecified: Secondary | ICD-10-CM | POA: Diagnosis not present

## 2018-05-04 DIAGNOSIS — M1712 Unilateral primary osteoarthritis, left knee: Secondary | ICD-10-CM

## 2018-05-04 DIAGNOSIS — Z87891 Personal history of nicotine dependence: Secondary | ICD-10-CM | POA: Insufficient documentation

## 2018-05-04 DIAGNOSIS — M79604 Pain in right leg: Secondary | ICD-10-CM | POA: Insufficient documentation

## 2018-05-04 DIAGNOSIS — S32512D Fracture of superior rim of left pubis, subsequent encounter for fracture with routine healing: Secondary | ICD-10-CM | POA: Diagnosis not present

## 2018-05-04 MED ORDER — HYDROCODONE-ACETAMINOPHEN 7.5-325 MG PO TABS: 1.0000 | ORAL_TABLET | Freq: Four times a day (QID) | ORAL | 0 refills | Status: DC | PRN

## 2018-05-04 MED ORDER — CYCLOBENZAPRINE HCL 5 MG PO TABS: ORAL_TABLET | ORAL | 0 refills | Status: DC

## 2018-05-04 NOTE — Progress Notes (Signed)
Subjective:    Patient ID: Crystal Lee, female    DOB: April 06, 1934, 84 y.o.   MRN: [id number]  HPI: Crystal Lee is a 84 y.o. female who returns for follow up appointment for chronic pain and medication refill. She states her pain is located in her lower back radiating into her bilateral hips and bilateral lower extremities with tingling and burning. She rates her pain 7. Her current exercise regime is walking.   Crystal Lee reports her husband has personal care aides and sitters who assist her with his care.She noticed on 04/30/18 when one of the sitter's left her home she couldn't find her hydrocodone bottle. She didn't call police or file a police report. She states she didn't have proof. We discuss the narcotic policy and if this occurs again to call the police, she was instructed to purchased a lock box. She verbalizes understanding. We will prescribe a  bridge prescription today, she verbalizes understanding.   Crystal Lee Morphine equivalent is 30.00 MME.  Last Oral Swab was Performed on 12/23/2017, it was consistent.   Pain Inventory Average Pain 7 Pain Right Now 7 My pain is sharp, burning and aching  In the last 24 hours, has pain interfered with the following? General activity 7 Relation with others 7 Enjoyment of life 5 What TIME of day is your pain at its worst? daytime Sleep (in general) Fair  Pain is worse with: walking, bending and standing Pain improves with: rest and medication Relief from Meds: 5  Mobility use a cane use a walker ability to climb steps?  no do you drive?  yes  Function retired  Neuro/Psych bladder control problems trouble walking  Prior Studies Any changes since last visit?  no  Physicians involved in your care Any changes since last visit?  no   Family History  Problem Relation Age of Onset  . Other Mother        complications from flu  . Other Father        unsure of cause   Social History   Socioeconomic History  .  Marital status: Married    Spouse name: Not on file  . Number of children: 3  . Years of education: 16 years  . Highest education level: Not on file  Occupational History  . Occupation: Retired  Scientific laboratory technician  . Financial resource strain: Not on file  . Food insecurity:    Worry: Not on file    Inability: Not on file  . Transportation needs:    Medical: Not on file    Non-medical: Not on file  Tobacco Use  . Smoking status: Former Smoker    Last attempt to quit: 02/03/1976    Years since quitting: 42.2  . Smokeless tobacco: Never Used  Substance and Sexual Activity  . Alcohol use: No  . Drug use: No  . Sexual activity: Yes    Birth control/protection: Post-menopausal  Lifestyle  . Physical activity:    Days per week: Not on file    Minutes per session: Not on file  . Stress: Not on file  Relationships  . Social connections:    Talks on phone: Not on file    Gets together: Not on file    Attends religious service: Not on file    Active member of club or organization: Not on file    Attends meetings of clubs or organizations: Not on file    Relationship status: Not on file  Other Topics Concern  .  Not on file  Social History Narrative   Lives at home with husband.   Right-handed.   No caffeine use.   Past Surgical History:  Procedure Laterality Date  . ABDOMINAL HYSTERECTOMY  2010  . APPENDECTOMY  1949  . [place]  . BREAST SURGERY    . CATARACT EXTRACTION Bilateral 2011  . CHOLECYSTECTOMY    . GALLBLADDER SURGERY  2015  . JOINT REPLACEMENT     RT TOTAL HIP / RT TOTAL KNEE  . MASTECTOMY  1998   BILATERAL   . SKIN CANCER EXCISION  2016   RT SIDE OF NOSE  . TONSILLECTOMY  1953  . TOTAL HIP ARTHROPLASTY  2010   RIGHT  . TOTAL HIP ARTHROPLASTY Left 05/09/2015   Procedure: LEFT TOTAL HIP ARTHROPLASTY ANTERIOR APPROACH;  Surgeon: Gaynelle Arabian, MD;  Location: [facility];  Service: Orthopedics;  Laterality: Left;  . TOTAL KNEE ARTHROPLASTY  2001  . TUMOR  REMOVAL  2012   ABDOMINAL - NON CANCEROUS   Past Medical History:  Diagnosis Date  . Arthritis   . Cancer (HCC)    HX BREAST CANCER/ SKIN CANCER  . Complication of anesthesia    N/V WITH MORPHINE  . Difficulty sleeping   . Fractured hip ([place])    LEFT - AUG 2016  . GERD (gastroesophageal reflux disease)   . Hyperlipidemia   . Hypertension   . Melanoma ([place])   . Neuropathy   . Nocturia   . Osteopenia   . 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 PONV (postoperative nausea and vomiting)    PT STATES MORPHINE CAUSED N/V  . Rosacea   . Sleep apnea   . Stage 1 breast cancer, ER+, right ([place]) 07/04/2017   There were no vitals taken for this visit.  Opioid Risk Score:   Fall Risk Score:  `1  Depression screen PHQ 2/9  Depression screen [facility] 2/9 04/10/2018 02/20/2018 10/17/2017  Decreased Interest 0 0 0  Down, Depressed, Hopeless 0 0 0  PHQ - 2 Score 0 0 0  Altered sleeping - - 3  Tired, decreased energy - - 3  Change in appetite - - 0  Feeling bad or failure about yourself  - - 0  Trouble concentrating - - 0  Moving slowly or fidgety/restless - - 0  Suicidal thoughts - - 0  PHQ-9 Score - - 6  Difficult doing work/chores - - Somewhat difficult     Review of Systems  Constitutional: Negative.   HENT: Negative.   Eyes: Negative.   Respiratory: Negative.   Cardiovascular: Negative.   Gastrointestinal: Positive for constipation.  Endocrine: Negative.   Genitourinary: Negative.   Musculoskeletal: Positive for arthralgias, gait problem and myalgias.  Skin: Negative.   Allergic/Immunologic: Negative.   Hematological: Negative.   Psychiatric/Behavioral: Negative.   All other systems reviewed and are negative.      Objective:   Physical Exam  Constitutional: She is oriented to person, place, and time. She appears well-developed and well-nourished.  HENT:  Head: Normocephalic and atraumatic.  Neck: Normal range of motion. Neck supple.  Cardiovascular:  Normal rate and regular rhythm.  Pulmonary/Chest: Effort normal and breath sounds normal.  Musculoskeletal:  Normal Muscle Bulk and Muscle Testing Reveals:  Upper Extremities: Full ROM and Muscle Strength 4/5  Lumbar Paraspinal Tenderness : L-3-L-5 Bilateral Greater Trochanter Tenderness L>R Lower Extremities: Decreased ROM and Muscle Strength 4/5  Left Lower Extremity Flexion Produces Pain into Left Lower Extremity Arises from  chair slowly using walker for support Antalgic Gait  Neurological: She is alert and oriented to person, place, and time.  Skin: Skin is warm and dry.  Psychiatric: She has a normal mood and affect. Her behavior is normal.  Nursing note and vitals reviewed.         Assessment & Plan:  1. Chronic Bilateral Leg pain: Paresthesia: Continue Lyrica. Continue to Monitor.05/04/2018 2. Lumbar Radiculitis: Continue Lyrica. 05/04/2018 3. Acute Pain of Left Wrist/ : No complaints Today: S/P Carpal Tunnel Release on 06/03/2017 by Dr. Ellene Route. Dr. Ellene Route Following. 05/04/2018. 4. Fracture of superior pubic ramus.Dr. AluisioFollowing.Continue to monitor. 05/04/2018. 3. Left Knee OA: Continue Voltaren Gel. No complaints today. Continue to monitor.05/04/2018. 4. Polyarthralgia: Continue to alternate with heat and ice therapy. Continue current medication regime. Continue to monitor.05/04/2018. 5. Chronic Pain Syndrome: Refilled:  Hydrocodone 7.5/325 mg one tablet every 6 hours as needed for moderate pain #20.05/04/2018.  30 minutesof face to face patient care time was spent during this visit. All questions were encouraged and answered.  F/U in 1 month

## 2018-05-08 ENCOUNTER — Encounter: Payer: Self-pay | Admitting: Gastroenterology

## 2018-05-08 ENCOUNTER — Ambulatory Visit (INDEPENDENT_AMBULATORY_CARE_PROVIDER_SITE_OTHER): Payer: Medicare Other | Admitting: Gastroenterology

## 2018-05-08 VITALS — BP 134/82 | HR 76 | Ht 66.0 in | Wt 241.0 lb

## 2018-05-08 DIAGNOSIS — Z860100 Personal history of colon polyps, unspecified: Secondary | ICD-10-CM

## 2018-05-08 DIAGNOSIS — R748 Abnormal levels of other serum enzymes: Secondary | ICD-10-CM

## 2018-05-08 DIAGNOSIS — K5900 Constipation, unspecified: Secondary | ICD-10-CM

## 2018-05-08 DIAGNOSIS — K219 Gastro-esophageal reflux disease without esophagitis: Secondary | ICD-10-CM | POA: Diagnosis not present

## 2018-05-08 MED ORDER — AMBULATORY NON FORMULARY MEDICATION: 10.0000 mL | 3 refills | Status: DC | PRN

## 2018-05-08 NOTE — Progress Notes (Signed)
Chief Complaint: GERD, Constipation   Referring Provider:     Lamar Blinks, MD    HPI:     Crystal Lee is a 84 y.o. female referred to the Gastroenterology Clinic for evaluation of reflux, constipation.  Recently moved from [place], [place] to [place], [place] approx 1 year ago. Previously followed by [facility] in [place] then by a few Psychiatric nurse in [place]. Last colonoscopy was approx 1 year ago and notable for a single polyp with recommendation to repeat in 5 years.   1) GERD: Index sxs of belching along with NCCP.  Has been present for over 20 years.  Has been evaluated in ER several times for these sxs with negative cardiac w/u. Had an EGD in the past (approx 2.5 years ago), but cannot recall results aside from hiatal hernia. Treated with Protonix and GI cocktail. She stopped Protonix due to lack of efficacy and treated with prn GI cocktail alone, which resolves sxs. Has also trialed omeprazole, esomeprazole, Zantac all without any improvement in sxs. No dysphagia or odynophagia. Sxs occur 2-3 times/week. Tried bland diet diet with some improvement. Worse with coffee, chocolate, spicy foods, tomato based sauces.   2) Constipation: Long standing history of constipation but exacerbated after recently started on calcium supplements after being treated for hypocalcemia at [facility]. Has been taking Linzess periodically for years, which she will take if sxs worsen.  Otherwise, can generally maintain regular bowel habits with dietary modifications.  Currently taking Linzess this week. Calcium now 9.8.  Unsure what it was at [facility] when started on calcium supplementation but lowest calcium noted in EMR was 8.0 in 2010 at 8.5 in 2016 otherwise has been generally mid nines since 2018.  Vit D 34 in Feb 2019.  She does have a history of osteoporosis along with recent pelvic fracture.  Started on vitamin D last month.   Past Medical History:    Diagnosis Date  . Arthritis   . Cancer (HCC)    HX BREAST CANCER/ SKIN CANCER  . Complication of anesthesia    N/V WITH MORPHINE  . Difficulty sleeping   . Fractured hip ([place])    LEFT - AUG 2016  . GERD (gastroesophageal reflux disease)   . Hyperlipidemia   . Hypertension   . Melanoma ([place])   . Neuropathy   . Nocturia   . Osteopenia   . 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 PONV (postoperative nausea and vomiting)    PT STATES MORPHINE CAUSED N/V  . Rosacea   . Sleep apnea   . Stage 1 breast cancer, ER+, right ([place]) 07/04/2017     Past Surgical History:  Procedure Laterality Date  . ABDOMINAL HYSTERECTOMY  2010  . APPENDECTOMY  1949  . [place]  . BREAST SURGERY    . CATARACT EXTRACTION Bilateral 2011  . CHOLECYSTECTOMY    . GALLBLADDER SURGERY  2015  . JOINT REPLACEMENT     RT TOTAL HIP / RT TOTAL KNEE  . MASTECTOMY  1998   BILATERAL   . SKIN CANCER EXCISION  2016   RT SIDE OF NOSE  . TONSILLECTOMY  1953  . TOTAL HIP ARTHROPLASTY  2010   RIGHT  . TOTAL HIP ARTHROPLASTY Left 05/09/2015   Procedure: LEFT TOTAL HIP ARTHROPLASTY ANTERIOR APPROACH;  Surgeon: Gaynelle Arabian, MD;  Location: [facility];  Service: Orthopedics;  Laterality:  Left;  . TOTAL KNEE ARTHROPLASTY  2001  . TUMOR REMOVAL  2012   ABDOMINAL - NON CANCEROUS   Family History  Problem Relation Age of Onset  . Other Mother        complications from flu  . Other Father        unsure of cause  . Colon cancer Neg Hx    Social History   Tobacco Use  . Smoking status: Former Smoker    Last attempt to quit: 02/03/1976    Years since quitting: 42.2  . Smokeless tobacco: Never Used  Substance Use Topics  . Alcohol use: No  . Drug use: No   Current Outpatient Medications  Medication Sig Dispense Refill  . Alum & Mag Hydroxide-Simeth (GI COCKTAIL) SUSP suspension Take 10 mLs by mouth 2 (two) times daily as needed for indigestion. Shake well. 900 mL 2  . atorvastatin (LIPITOR) 20  MG tablet Take 20 mg by mouth daily.    . baclofen (LIORESAL) 10 MG tablet Take 0.5-1 tablets (5-10 mg total) by mouth at bedtime as needed for muscle spasms. 90 tablet 3  . CALCIUM PO Take 1,200 mg by mouth daily.    . Cholecalciferol (VITAMIN D) 2000 units tablet Take 2,000 Units by mouth.    . conjugated estrogens (PREMARIN) vaginal cream Place 0.625 mg vaginally daily.    . cyclobenzaprine (FLEXERIL) 5 MG tablet 0.5  Tablet at bedtime as needed for muscle spasm 15 tablet 0  . diclofenac sodium (VOLTAREN) 1 % GEL Apply 1 application topically 3 (three) times daily. To feet, ankles, and knees. 3 Tube 4  . ergocalciferol (VITAMIN D2) 1.25 MG (50000 UT) capsule Take 1 capsule (50,000 Units total) by mouth once a week. 12 capsule 6  . HYDROcodone-acetaminophen (NORCO) 7.5-325 MG tablet Take 1 tablet by mouth every 6 (six) hours as needed for moderate pain. Change in Dosage: Please fill today 20 tablet 0  . LINZESS 290 MCG CAPS capsule Take 1 capsule by mouth daily.    Marland Kitchen losartan (COZAAR) 25 MG tablet Take 25 mg by mouth daily.    . ondansetron (ZOFRAN-ODT) 4 MG disintegrating tablet as needed.    . pregabalin (LYRICA) 100 MG capsule Take 1 capsule (100 mg total) by mouth 3 (three) times daily. 270 capsule 1   No current facility-administered medications for this visit.    Allergies  Allergen Reactions  . Morphine And Related Nausea And Vomiting     Review of Systems: All systems reviewed and negative except where noted in HPI.     Physical Exam:    Wt Readings from Last 3 Encounters:  05/08/18 241 lb (109.3 kg)  05/04/18 235 lb (106.6 kg)  04/19/18 242 lb (109.8 kg)    BP 134/82   Pulse 76   Ht '5\' 6"'$  (1.676 m)   Wt 241 lb (109.3 kg)   BMI 38.90 kg/m  Constitutional:  Pleasant, in no acute distress. Psychiatric: Normal mood and affect. Behavior is normal. EENT: Pupils normal.  Conjunctivae are normal. No scleral icterus. Neck supple. No cervical LAD. Cardiovascular: Normal  rate, regular rhythm. No edema Pulmonary/chest: Effort normal and breath sounds normal. No wheezing, rales or rhonchi. Abdominal: Soft, nondistended, nontender. Bowel sounds active throughout. There are no masses palpable. No hepatomegaly. Neurological: Alert and oriented to person place and time. Skin: Skin is warm and dry. No rashes noted.   ASSESSMENT AND PLAN;   Crystal Lee is a 84 y.o. female presenting with  1)  GERD: Patient with a longstanding history of reflux symptoms incompletely controlled with acid suppression therapy in the past.  Discussed trial of alternate acid suppression medication (i.e. Dexilant) versus further evaluation she opted for the latter. - Refer for esophageal manometry and pH/impedance testing off PPI therapy to differentiate reflux versus esophageal hypersensitivity versus functional chest pain  2) Constipation: Longstanding history of chronic constipation which seems to be generally well controlled with dietary modifications along with intermittent use of Linzess.  Was recently prescribed Linzess and currently using it without medication ADR.  Possible that her recent calcium supplementation has led to her more recent exacerbation in her chronic symptoms and will decrease. - Resume Linzess - Resume high fiber diet and increased fluid intake - Decrease Calcium supplement to triweekly. Repeat BMP in 2 weeks. If still normal, can consider d/c altogether with [facility] approval  3) Elevated ALP: 130 on recent panel in the setting of active/recent pelvic fractures. Otherwise normal TBili.  On review of prior labs, was 105 in 05/2017, 146 in 05/2015 and otherwise in the 80s. - Repeat in 6 months.  If still elevated, can consider GGT, alk phos fractionization and/or 5' nucleotidase and if elevated then MRCP to rule out biliary etiology  4) Hypocalcemia: Reports history of hypocalcemia diagnosed at [address] [place] and started on calcium supplementation.  Do not have access to his  labs currently.  Given recent normal calcium level will decrease supplementation given possible med ADR and will ultimately defer to [facility] for ongoing calcium.  Otherwise prescribed ergocalciferol by her Oncologist.  5) History of colon polyp: Reports history of polyp last colonoscopy with recommendation to repeat in 5 years. - Requested that she bring in copy of her prior records which she thinks she has at home for my review.  I spent a total of 45 minutes of face-to-face time with the patient. Greater than 50% of the time was spent counseling and coordinating care.    Lavena Bullion, DO, FACG  05/08/2018, 2:56 PM   Ennever, Rudell Cobb, MD

## 2018-05-08 NOTE — Patient Instructions (Addendum)
If you are age 82 or older, your body mass index should be between 23-30. Your Body mass index is 38.9 kg/m. If this is out of the aforementioned range listed, please consider follow up with your Primary Care Provider.  If you are age 58 or younger, your body mass index should be between 19-25. Your Body mass index is 38.9 kg/m. If this is out of the aformentioned range listed, please consider follow up with your Primary Care Provider.   Decrease calcium supplement to Monday-Wednesday-Friday  We have sent the following medications to your pharmacy for you to pick up at your convenience: GI Cocktail take 63mL every 4 hours as needed.  Stop taking your PPI medications 7 days prior to Manometry.  You have been scheduled for an esophageal manometry at [facility] on 06/12/2018 at 10:30am. Please arrive 30 minutes prior to your procedure for registration. You will need to go to outpatient registration (1st floor of the hospital) first. Make certain to bring your insurance cards as well as a complete list of medications.  Please remember the following:  1) Do not take any muscle relaxants, xanax (alprazolam) or ativan for 1 day prior to your test as well as the day of the test.  2) Nothing to eat or drink for 4 hours before your test.  3) Hold all diabetic medications/insulin the morning of the test. You may eat and take your medications after the test.  It will take at least 2 weeks to receive the results of this test from your physician. ------------------------------------------ ABOUT ESOPHAGEAL MANOMETRY Esophageal manometry (muh-NOM-uh-tree) is a test that gauges how well your esophagus works. Your esophagus is the long, muscular tube that connects your throat to your stomach. Esophageal manometry measures the rhythmic muscle contractions (peristalsis) that occur in your esophagus when you swallow. Esophageal manometry also measures the coordination and force exerted by the muscles  of your esophagus.  During esophageal manometry, a thin, flexible tube (catheter) that contains sensors is passed through your nose, down your esophagus and into your stomach. Esophageal manometry can be helpful in diagnosing some mostly uncommon disorders that affect your esophagus.  Why it's done Esophageal manometry is used to evaluate the movement (motility) of food through the esophagus and into the stomach. The test measures how well the circular bands of muscle (sphincters) at the top and bottom of your esophagus open and close, as well as the pressure, strength and pattern of the wave of esophageal muscle contractions that moves food along.  What you can expect Esophageal manometry is an outpatient procedure done without sedation. Most people tolerate it well. You may be asked to change into a hospital gown before the test starts.  During esophageal manometry  . While you are sitting up, a member of your health care team sprays your throat with a numbing medication or puts numbing gel in your nose or both.  . A catheter is guided through your nose into your esophagus. The catheter may be sheathed in a water-filled sleeve. It doesn't interfere with your breathing. However, your eyes may water, and you may gag. You may have a slight nosebleed from irritation.  . After the catheter is in place, you may be asked to lie on your back on an exam table, or you may be asked to remain seated.  . You then swallow small sips of water. As you do, a computer connected to the catheter records the pressure, strength and pattern of your esophageal muscle contractions.  Marland Kitchen  During the test, you'll be asked to breathe slowly and smoothly, remain as still as possible, and swallow only when you're asked to do so.  . A member of your health care team may move the catheter down into your stomach while the catheter continues its measurements.  . The catheter then is slowly withdrawn. The test usually lasts 20 to 30  minutes.  After esophageal manometry  When your esophageal manometry is complete, you may return to your normal activities  This test typically takes 30-45 minutes to complete. ________________________________________________________________________________

## 2018-05-09 ENCOUNTER — Telehealth: Payer: Self-pay | Admitting: Registered Nurse

## 2018-05-09 LAB — TOXASSURE SELECT,+ANTIDEPR,UR

## 2018-05-09 MED ORDER — HYDROCODONE-ACETAMINOPHEN 7.5-325 MG PO TABS: 1.0000 | ORAL_TABLET | Freq: Four times a day (QID) | ORAL | 0 refills | Status: DC | PRN

## 2018-05-09 NOTE — Telephone Encounter (Signed)
Hydrocodone prescription sent to pharmacy, the remainder of December tablets.  [name] was called on 05/08/2018, she is aware of the above.

## 2018-05-10 ENCOUNTER — Ambulatory Visit: Payer: Medicare Other | Admitting: Registered Nurse

## 2018-05-10 ENCOUNTER — Telehealth: Payer: Self-pay | Admitting: *Deleted
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
## 2018-05-10 NOTE — Telephone Encounter (Signed)
Urine drug screen is inconsistent. Her prescribed hydrocodone is present, but metabolites of oxycodone are present as well.  Because there is noroxycodone as well as the oxymorphone metabolites, this is a result of taking oxycodone. If there were no noroxycodone, then I would suspect she has taken Opana (oxymorphone).

## 2018-05-16 DIAGNOSIS — M654 Radial styloid tenosynovitis [de Quervain]: Secondary | ICD-10-CM

## 2018-05-16 HISTORY — DX: Radial styloid tenosynovitis (de quervain): M65.4

## 2018-05-17 ENCOUNTER — Ambulatory Visit: Payer: Medicare Other | Admitting: Registered Nurse

## 2018-05-17 DIAGNOSIS — M79642 Pain in left hand: Secondary | ICD-10-CM | POA: Diagnosis not present

## 2018-05-17 DIAGNOSIS — M654 Radial styloid tenosynovitis [de Quervain]: Secondary | ICD-10-CM | POA: Diagnosis not present

## 2018-05-19 ENCOUNTER — Ambulatory Visit: Payer: Medicare Other | Admitting: Registered Nurse

## 2018-05-22 ENCOUNTER — Other Ambulatory Visit: Payer: Medicare Other

## 2018-05-23 ENCOUNTER — Other Ambulatory Visit (INDEPENDENT_AMBULATORY_CARE_PROVIDER_SITE_OTHER): Payer: Medicare Other

## 2018-05-23 DIAGNOSIS — K219 Gastro-esophageal reflux disease without esophagitis: Secondary | ICD-10-CM

## 2018-05-23 DIAGNOSIS — K5900 Constipation, unspecified: Secondary | ICD-10-CM

## 2018-05-23 DIAGNOSIS — R748 Abnormal levels of other serum enzymes: Secondary | ICD-10-CM | POA: Diagnosis not present

## 2018-05-23 LAB — BASIC METABOLIC PANEL
BUN: 15 mg/dL (ref 6–23)
CO2: 29 meq/L (ref 19–32)
Calcium: 9.3 mg/dL (ref 8.4–10.5)
Chloride: 104 mEq/L (ref 96–112)
Creatinine, Ser: 0.56 mg/dL (ref 0.40–1.20)
GFR: 109.99 mL/min (ref >60.00)
Glucose, Bld: 100 mg/dL — ABNORMAL HIGH (ref 70–99)
Potassium: 4.4 mEq/L (ref 3.5–5.1)
Sodium: 139 mEq/L (ref 135–145)

## 2018-05-29 DIAGNOSIS — C44311 Basal cell carcinoma of skin of nose: Secondary | ICD-10-CM | POA: Diagnosis not present

## 2018-06-02 ENCOUNTER — Encounter: Payer: Self-pay | Admitting: Registered Nurse

## 2018-06-02 ENCOUNTER — Encounter: Payer: Medicare Other | Attending: Physical Medicine & Rehabilitation | Admitting: Registered Nurse

## 2018-06-02 VITALS — BP 121/77 | HR 89 | Ht 66.0 in | Wt 241.0 lb

## 2018-06-02 DIAGNOSIS — M1712 Unilateral primary osteoarthritis, left knee: Secondary | ICD-10-CM | POA: Diagnosis not present

## 2018-06-02 DIAGNOSIS — Z79891 Long term (current) use of opiate analgesic: Secondary | ICD-10-CM | POA: Diagnosis not present

## 2018-06-02 DIAGNOSIS — E785 Hyperlipidemia, unspecified: Secondary | ICD-10-CM | POA: Diagnosis not present

## 2018-06-02 DIAGNOSIS — K219 Gastro-esophageal reflux disease without esophagitis: Secondary | ICD-10-CM | POA: Insufficient documentation

## 2018-06-02 DIAGNOSIS — M7062 Trochanteric bursitis, left hip: Secondary | ICD-10-CM | POA: Diagnosis not present

## 2018-06-02 DIAGNOSIS — Z853 Personal history of malignant neoplasm of breast: Secondary | ICD-10-CM | POA: Diagnosis not present

## 2018-06-02 DIAGNOSIS — Z96643 Presence of artificial hip joint, bilateral: Secondary | ICD-10-CM | POA: Diagnosis not present

## 2018-06-02 DIAGNOSIS — Z9013 Acquired absence of bilateral breasts and nipples: Secondary | ICD-10-CM | POA: Insufficient documentation

## 2018-06-02 DIAGNOSIS — M7061 Trochanteric bursitis, right hip: Secondary | ICD-10-CM

## 2018-06-02 DIAGNOSIS — M8580 Other specified disorders of bone density and structure, unspecified site: Secondary | ICD-10-CM | POA: Insufficient documentation

## 2018-06-02 DIAGNOSIS — Z9889 Other specified postprocedural states: Secondary | ICD-10-CM | POA: Diagnosis not present

## 2018-06-02 DIAGNOSIS — Z8673 Personal history of transient ischemic attack (TIA), and cerebral infarction without residual deficits: Secondary | ICD-10-CM | POA: Diagnosis not present

## 2018-06-02 DIAGNOSIS — G894 Chronic pain syndrome: Secondary | ICD-10-CM

## 2018-06-02 DIAGNOSIS — Z8489 Family history of other specified conditions: Secondary | ICD-10-CM | POA: Diagnosis not present

## 2018-06-02 DIAGNOSIS — M7918 Myalgia, other site: Secondary | ICD-10-CM | POA: Diagnosis not present

## 2018-06-02 DIAGNOSIS — M79605 Pain in left leg: Secondary | ICD-10-CM | POA: Diagnosis not present

## 2018-06-02 DIAGNOSIS — M5416 Radiculopathy, lumbar region: Secondary | ICD-10-CM

## 2018-06-02 DIAGNOSIS — R202 Paresthesia of skin: Secondary | ICD-10-CM | POA: Diagnosis not present

## 2018-06-02 DIAGNOSIS — Z9049 Acquired absence of other specified parts of digestive tract: Secondary | ICD-10-CM | POA: Insufficient documentation

## 2018-06-02 DIAGNOSIS — M62838 Other muscle spasm: Secondary | ICD-10-CM

## 2018-06-02 DIAGNOSIS — M2550 Pain in unspecified joint: Secondary | ICD-10-CM | POA: Diagnosis not present

## 2018-06-02 DIAGNOSIS — Z5181 Encounter for therapeutic drug level monitoring: Secondary | ICD-10-CM

## 2018-06-02 DIAGNOSIS — Z96651 Presence of right artificial knee joint: Secondary | ICD-10-CM | POA: Diagnosis not present

## 2018-06-02 DIAGNOSIS — G4730 Sleep apnea, unspecified: Secondary | ICD-10-CM | POA: Diagnosis not present

## 2018-06-02 DIAGNOSIS — M79604 Pain in right leg: Secondary | ICD-10-CM | POA: Insufficient documentation

## 2018-06-02 DIAGNOSIS — I10 Essential (primary) hypertension: Secondary | ICD-10-CM | POA: Diagnosis not present

## 2018-06-02 DIAGNOSIS — G8929 Other chronic pain: Secondary | ICD-10-CM | POA: Insufficient documentation

## 2018-06-02 DIAGNOSIS — Z87891 Personal history of nicotine dependence: Secondary | ICD-10-CM | POA: Insufficient documentation

## 2018-06-02 DIAGNOSIS — Z85820 Personal history of malignant melanoma of skin: Secondary | ICD-10-CM | POA: Diagnosis not present

## 2018-06-02 LAB — UNLABELED: Test Ordered On Req: 93976

## 2018-06-02 MED ORDER — CYCLOBENZAPRINE HCL 5 MG PO TABS: ORAL_TABLET | ORAL | 2 refills | Status: DC

## 2018-06-02 MED ORDER — HYDROCODONE-ACETAMINOPHEN 7.5-325 MG PO TABS: 1.0000 | ORAL_TABLET | Freq: Four times a day (QID) | ORAL | 0 refills | Status: DC | PRN

## 2018-06-02 NOTE — Progress Notes (Signed)
Subjective:    Patient ID: Crystal Lee, female    DOB: 1933/06/16, 85 y.o.   MRN: [id number]  HPI: Crystal Lee is a 85 y.o. female who returns for follow up appointment for chronic pain and medication refill. She states herpain is located in her lower back radiating into her bilateral hips, bilateral lower extremities and left knee pain. She rates her pain 5.  Her current exercise regime is walking.  Ms. Rawles Morphine equivalent is 30.00 MME.  Also reports she left some of her Hydrocodone at home, she's visiting her husband at the rehabilitation Center. She was instructed to bring all medication to her scheduled appointments, she verbalizes understanding.   Last UDS was Performed on 05/14/2018, see note for detail. Oral Swab was Performed today.  Pain Inventory Average Pain 5 Pain Right Now 5 My pain is constant, burning, tingling and aching  In the last 24 hours, has pain interfered with the following? General activity 6 Relation with others 6 Enjoyment of life 6 What TIME of day is your pain at its worst? all Sleep (in general) Poor  Pain is worse with: walking, bending, standing and some activites Pain improves with: rest and medication Relief from Meds: 5  Mobility walk with assistance use a cane use a walker how many minutes can you walk? 10 ability to climb steps?  no do you drive?  yes  Function retired I need assistance with the following:  dressing and shopping  Neuro/Psych bladder control problems tingling  Prior Studies Any changes since last visit?  no bone scan x-rays CT/MRI  Physicians involved in your care Any changes since last visit?  no   Family History  Problem Relation Age of Onset  . Other Mother        complications from flu  . Other Father        unsure of cause  . Colon cancer Neg Hx    Social History   Socioeconomic History  . Marital status: Married    Spouse name: Not on file  . Number of children: 3  . Years of  education: 16 years  . Highest education level: Not on file  Occupational History  . Occupation: Retired  Scientific laboratory technician  . Financial resource strain: Not on file  . Food insecurity:    Worry: Not on file    Inability: Not on file  . Transportation needs:    Medical: Not on file    Non-medical: Not on file  Tobacco Use  . Smoking status: Former Smoker    Last attempt to quit: 02/03/1976    Years since quitting: 42.3  . Smokeless tobacco: Never Used  Substance and Sexual Activity  . Alcohol use: No  . Drug use: No  . Sexual activity: Yes    Birth control/protection: Post-menopausal  Lifestyle  . Physical activity:    Days per week: Not on file    Minutes per session: Not on file  . Stress: Not on file  Relationships  . Social connections:    Talks on phone: Not on file    Gets together: Not on file    Attends religious service: Not on file    Active member of club or organization: Not on file    Attends meetings of clubs or organizations: Not on file    Relationship status: Not on file  Other Topics Concern  . Not on file  Social History Narrative   Lives at home with husband.   Right-handed.  No caffeine use.   Past Surgical History:  Procedure Laterality Date  . ABDOMINAL HYSTERECTOMY  2010  . APPENDECTOMY  1949  . [place]  . BREAST SURGERY    . CATARACT EXTRACTION Bilateral 2011  . CHOLECYSTECTOMY    . GALLBLADDER SURGERY  2015  . JOINT REPLACEMENT     RT TOTAL HIP / RT TOTAL KNEE  . MASTECTOMY  1998   BILATERAL   . SKIN CANCER EXCISION  2016   RT SIDE OF NOSE  . TONSILLECTOMY  1953  . TOTAL HIP ARTHROPLASTY  2010   RIGHT  . TOTAL HIP ARTHROPLASTY Left 05/09/2015   Procedure: LEFT TOTAL HIP ARTHROPLASTY ANTERIOR APPROACH;  Surgeon: Gaynelle Arabian, MD;  Location: [facility];  Service: Orthopedics;  Laterality: Left;  . TOTAL KNEE ARTHROPLASTY  2001  . TUMOR REMOVAL  2012   ABDOMINAL - NON CANCEROUS   Past Medical History:  Diagnosis Date  .  Arthritis   . Cancer (HCC)    HX BREAST CANCER/ SKIN CANCER  . Complication of anesthesia    N/V WITH MORPHINE  . Difficulty sleeping   . Fractured hip ([place])    LEFT - AUG 2016  . GERD (gastroesophageal reflux disease)   . Hyperlipidemia   . Hypertension   . Melanoma ([place])   . Neuropathy   . Nocturia   . Osteopenia   . 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 PONV (postoperative nausea and vomiting)    PT STATES MORPHINE CAUSED N/V  . Rosacea   . Sleep apnea   . Stage 1 breast cancer, ER+, right ([place]) 07/04/2017   There were no vitals taken for this visit.  Opioid Risk Score:   Fall Risk Score:  `1  Depression screen PHQ 2/9  Depression screen [facility] 2/9 04/10/2018 02/20/2018 10/17/2017  Decreased Interest 0 0 0  Down, Depressed, Hopeless 0 0 0  PHQ - 2 Score 0 0 0  Altered sleeping - - 3  Tired, decreased energy - - 3  Change in appetite - - 0  Feeling bad or failure about yourself  - - 0  Trouble concentrating - - 0  Moving slowly or fidgety/restless - - 0  Suicidal thoughts - - 0  PHQ-9 Score - - 6  Difficult doing work/chores - - Somewhat difficult    Review of Systems  Constitutional: Negative.   HENT: Negative.   Eyes: Negative.   Respiratory: Negative.   Cardiovascular: Negative.   Gastrointestinal: Positive for constipation.  Endocrine: Negative.   Genitourinary: Positive for dyspareunia and dysuria.  Musculoskeletal: Negative.   Skin: Negative.   Allergic/Immunologic: Negative.   Neurological: Negative.   Hematological: Negative.   Psychiatric/Behavioral: Negative.   All other systems reviewed and are negative.      Objective:   Physical Exam Vitals signs and nursing note reviewed.  Constitutional:      Appearance: Normal appearance.  Neck:     Musculoskeletal: Normal range of motion and neck supple.  Cardiovascular:     Rate and Rhythm: Normal rate and regular rhythm.  Pulmonary:     Effort: Pulmonary effort is normal.     Breath  sounds: Normal breath sounds.  Musculoskeletal:     Comments: Normal Muscle Bulk and Muscle Testing Reveals:  Upper Extremities: Full ROM and Muscle Strength 5/5  Lumbar Paraspinal Tenderness: L-4-L-5 Bilateral Greater Trochanter Tenderness Lower Extremities: Full ROM and Muscle Strength 5/5 Arises from chair slowly using walker for support Narrow Based  Gait   Skin:    General: Skin is warm and dry.  Neurological:     Mental Status: She is alert and oriented to person, place, and time.  Psychiatric:        Mood and Affect: Mood normal.        Behavior: Behavior normal.           Assessment & Plan:  1. Chronic Bilateral Leg pain: Paresthesia: Continue Lyrica. Continue to Monitor.06/02/2018 2. Lumbar Radiculitis: Continue Lyrica. 06/02/2018 3. Acute Pain of Left Wrist/: No complaints Today:S/P Carpal Tunnel Release on 06/03/2017 by Dr. Ellene Route. Dr. Ellene Route Following. 06/02/2018. 4. Fracture of superior pubic ramus.Dr. AluisioFollowing.Continue to monitor. 06/02/2018. 3. Left Knee OA: Continue Voltaren Gel.Continue to monitor. Orthopedist following. 06/02/2018. 4. Polyarthralgia: Continue to alternate with heat and ice therapy. Continue current medication regime. Continue to monitor.06/02/2018. 5. Chronic Pain Syndrome: Refilled: Hydrocodone7.5/325 mg one tablet every 6 hours as needed for moderate pain #120.06/02/2018.  20 minutesof face to face patient care time was spent during this visit. All questions were encouraged and answered.  F/U in 1 month

## 2018-06-05 ENCOUNTER — Telehealth: Payer: Self-pay | Admitting: *Deleted

## 2018-06-05 NOTE — Telephone Encounter (Signed)
Calling Lysle Morales about an issue with the oral swab submitted on 06/02/18.  Use reference [id number]

## 2018-06-06 DIAGNOSIS — L570 Actinic keratosis: Secondary | ICD-10-CM | POA: Diagnosis not present

## 2018-06-06 DIAGNOSIS — Z85828 Personal history of other malignant neoplasm of skin: Secondary | ICD-10-CM | POA: Diagnosis not present

## 2018-06-06 NOTE — Telephone Encounter (Signed)
Called [facility] and faxed back proper information to test the oral swab taken.

## 2018-06-12 LAB — DRUG TOX MONITOR 1 W/CONF, ORAL FLD
Amphetamines: NEGATIVE ng/mL (ref <10)
Barbiturates: NEGATIVE ng/mL (ref <10)
Benzodiazepines: NEGATIVE ng/mL (ref <0.50)
Buprenorphine: NEGATIVE ng/mL (ref <0.10)
CODEINE: NEGATIVE ng/mL (ref <2.5)
Cocaine: NEGATIVE ng/mL (ref <5.0)
DIHYDROCODEINE: 13.1 ng/mL — AB (ref <2.5)
Fentanyl: NEGATIVE ng/mL (ref <0.10)
Heroin Metabolite: NEGATIVE ng/mL (ref <1.0)
Hydrocodone: 197.3 ng/mL — ABNORMAL HIGH (ref <2.5)
Hydromorphone: NEGATIVE ng/mL (ref <2.5)
MARIJUANA: NEGATIVE ng/mL (ref <2.5)
MDMA: NEGATIVE ng/mL (ref <10)
MEPROBAMATE: NEGATIVE ng/mL (ref <2.5)
Methadone: NEGATIVE ng/mL (ref <5.0)
Morphine: NEGATIVE ng/mL (ref <2.5)
Nicotine Metabolite: NEGATIVE ng/mL (ref <5.0)
Norhydrocodone: 7.3 ng/mL — ABNORMAL HIGH (ref <2.5)
Noroxycodone: NEGATIVE ng/mL (ref <2.5)
Opiates: POSITIVE ng/mL — AB (ref <2.5)
Oxycodone: NEGATIVE ng/mL (ref <2.5)
Oxymorphone: NEGATIVE ng/mL (ref <2.5)
Phencyclidine: NEGATIVE ng/mL (ref <10)
TRAMADOL: NEGATIVE ng/mL (ref <5.0)
Tapentadol: NEGATIVE ng/mL (ref <5.0)
Zolpidem: NEGATIVE ng/mL (ref <5.0)

## 2018-06-12 LAB — DRUG TOX ALC METAB W/CON, ORAL FLD: Alcohol Metabolite: NEGATIVE ng/mL (ref <25)

## 2018-06-12 LAB — PAT ID TIQ DOC

## 2018-06-13 ENCOUNTER — Telehealth: Payer: Self-pay | Admitting: *Deleted

## 2018-06-13 NOTE — Telephone Encounter (Signed)
Oral swab drug screen was consistent for prescribed medications.  ?

## 2018-06-14 DIAGNOSIS — G5603 Carpal tunnel syndrome, bilateral upper limbs: Secondary | ICD-10-CM | POA: Diagnosis not present

## 2018-06-14 DIAGNOSIS — M654 Radial styloid tenosynovitis [de Quervain]: Secondary | ICD-10-CM | POA: Diagnosis not present

## 2018-06-16 DIAGNOSIS — S32512D Fracture of superior rim of left pubis, subsequent encounter for fracture with routine healing: Secondary | ICD-10-CM | POA: Diagnosis not present

## 2018-06-16 DIAGNOSIS — M5137 Other intervertebral disc degeneration, lumbosacral region: Secondary | ICD-10-CM | POA: Diagnosis not present

## 2018-06-16 DIAGNOSIS — M7062 Trochanteric bursitis, left hip: Secondary | ICD-10-CM | POA: Diagnosis not present

## 2018-06-16 DIAGNOSIS — S32519A Fracture of superior rim of unspecified pubis, initial encounter for closed fracture: Secondary | ICD-10-CM | POA: Insufficient documentation

## 2018-06-16 HISTORY — DX: Fracture of superior rim of unspecified pubis, initial encounter for closed fracture: S32.519A

## 2018-06-20 ENCOUNTER — Telehealth: Payer: Self-pay

## 2018-06-20 NOTE — Telephone Encounter (Signed)
Pt called stating Dr. Reynaldo Minium put her on Methocarbamol

## 2018-06-21 ENCOUNTER — Ambulatory Visit (HOSPITAL_COMMUNITY)
Admission: RE | Admit: 2018-06-21 | Discharge: 2018-06-21 | Disposition: A | Discharge status: Home / Self Care | Payer: Medicare Other | Attending: Gastroenterology | Admitting: Gastroenterology

## 2018-06-21 ENCOUNTER — Encounter (HOSPITAL_COMMUNITY)
Admission: RE | Disposition: A | Discharge status: Home / Self Care | Payer: Self-pay | Source: Home / Self Care | Attending: Gastroenterology

## 2018-06-21 ENCOUNTER — Encounter (HOSPITAL_COMMUNITY): Payer: Self-pay | Admitting: Gastroenterology

## 2018-06-21 DIAGNOSIS — R079 Chest pain, unspecified: Secondary | ICD-10-CM | POA: Insufficient documentation

## 2018-06-21 DIAGNOSIS — R05 Cough: Secondary | ICD-10-CM | POA: Insufficient documentation

## 2018-06-21 DIAGNOSIS — R12 Heartburn: Secondary | ICD-10-CM | POA: Diagnosis not present

## 2018-06-21 DIAGNOSIS — K219 Gastro-esophageal reflux disease without esophagitis: Secondary | ICD-10-CM | POA: Diagnosis not present

## 2018-06-21 HISTORY — PX: ESOPHAGEAL MANOMETRY: SHX5429

## 2018-06-21 HISTORY — PX: PH IMPEDANCE STUDY: SHX5565

## 2018-06-21 HISTORY — PX: 24 HOUR PH STUDY: SHX5419

## 2018-06-21 SURGERY — MANOMETRY, ESOPHAGUS

## 2018-06-21 MED ORDER — LIDOCAINE VISCOUS HCL 2 % MT SOLN
OROMUCOSAL | Status: AC
  Filled 2018-06-21: qty 15

## 2018-06-21 SURGICAL SUPPLY — 2 items
FACESHIELD LNG OPTICON STERILE (SAFETY) IMPLANT
GLOVE BIO SURGEON STRL SZ8 (GLOVE) ×8 IMPLANT

## 2018-06-21 NOTE — Progress Notes (Signed)
Esophageal manometry performed per protocol.  Patient tolerated procedure without complications.  PH probe placed per protocol at 38cm.  Patient tolerated placement without complications.  Patient given instructions on how to use monitor and journal.  Patient verbalized understanding.  Patient to return tomorrow around 11:30.  Dr. Silverio Decamp to interpret results.

## 2018-06-23 DIAGNOSIS — H02102 Unspecified ectropion of right lower eyelid: Secondary | ICD-10-CM | POA: Diagnosis not present

## 2018-06-26 ENCOUNTER — Encounter (HOSPITAL_BASED_OUTPATIENT_CLINIC_OR_DEPARTMENT_OTHER): Payer: Self-pay | Admitting: Emergency Medicine

## 2018-06-26 ENCOUNTER — Emergency Department (HOSPITAL_BASED_OUTPATIENT_CLINIC_OR_DEPARTMENT_OTHER)
Admission: EM | Admit: 2018-06-26 | Discharge: 2018-06-26 | Disposition: A | Discharge status: Home / Self Care | Payer: Medicare Other | Attending: Emergency Medicine | Admitting: Emergency Medicine

## 2018-06-26 ENCOUNTER — Other Ambulatory Visit: Payer: Self-pay

## 2018-06-26 ENCOUNTER — Emergency Department (HOSPITAL_BASED_OUTPATIENT_CLINIC_OR_DEPARTMENT_OTHER): Payer: Medicare Other

## 2018-06-26 DIAGNOSIS — Z853 Personal history of malignant neoplasm of breast: Secondary | ICD-10-CM | POA: Diagnosis not present

## 2018-06-26 DIAGNOSIS — Z96651 Presence of right artificial knee joint: Secondary | ICD-10-CM | POA: Diagnosis not present

## 2018-06-26 DIAGNOSIS — Z79899 Other long term (current) drug therapy: Secondary | ICD-10-CM | POA: Diagnosis not present

## 2018-06-26 DIAGNOSIS — Z85828 Personal history of other malignant neoplasm of skin: Secondary | ICD-10-CM | POA: Diagnosis not present

## 2018-06-26 DIAGNOSIS — Z87891 Personal history of nicotine dependence: Secondary | ICD-10-CM | POA: Insufficient documentation

## 2018-06-26 DIAGNOSIS — R102 Pelvic and perineal pain: Secondary | ICD-10-CM

## 2018-06-26 DIAGNOSIS — I10 Essential (primary) hypertension: Secondary | ICD-10-CM | POA: Diagnosis not present

## 2018-06-26 DIAGNOSIS — Z96643 Presence of artificial hip joint, bilateral: Secondary | ICD-10-CM | POA: Diagnosis not present

## 2018-06-26 DIAGNOSIS — Z471 Aftercare following joint replacement surgery: Secondary | ICD-10-CM | POA: Diagnosis not present

## 2018-06-26 MED ORDER — HYDROMORPHONE HCL 1 MG/ML IJ SOLN
0.5000 mg | Freq: Once | INTRAMUSCULAR | Status: AC
  Administered 2018-06-26: 0.5 mg via INTRAMUSCULAR
  Filled 2018-06-26: qty 1

## 2018-06-26 NOTE — ED Notes (Signed)
ED Provider at bedside. 

## 2018-06-26 NOTE — ED Notes (Signed)
Patient transported to X-ray 

## 2018-06-26 NOTE — ED Provider Notes (Signed)
[facility] EMERGENCY DEPARTMENT Provider Note   CSN: [id number] Arrival date & time: 06/26/18  0859     History   Chief Complaint Chief Complaint  Patient presents with  . Hip Pain    HPI Crystal Lee is a 85 y.o. female.  Patient is a 85 year old female who presents with pelvic pain.  She reports that in June of this past year she was diagnosed with pelvic fractures.  She denies any fall at that time.  She was having trouble walking and had imaging which revealed pelvic fracture.  She is followed by Dr. Elmyra Ricks with [facility].  She reports that she did see him last week and was told that the fracture still have not healed.  She has chronic back pain as well as chronic pelvic pain.  She has chronic knee pain as well.  She is followed by pain management and takes hydrocodone 7.5 mg tablets for pain.  She also uses Voltaren gel and Lyrica.  She states that today she just does not feel like her pain is well managed.  She told me that she did contact her pain management doctor who advised her that she needs to follow-up with her doctor.  When I asked her who that office wanted her to follow-up with, she is unclear.  She denies any new injuries.  No recent falls.  She ambulates at home with a walker and sometimes a cane.  She drove herself to the emergency room today.     Past Medical History:  Diagnosis Date  . Arthritis   . Cancer (HCC)    HX BREAST CANCER/ SKIN CANCER  . Complication of anesthesia    N/V WITH MORPHINE  . Difficulty sleeping   . Fractured hip ([place])    LEFT - AUG 2016  . GERD (gastroesophageal reflux disease)   . Hyperlipidemia   . Hypertension   . Melanoma ([place])   . Neuropathy   . Nocturia   . Osteopenia   . 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 PONV (postoperative nausea and vomiting)    PT STATES MORPHINE CAUSED N/V  . Rosacea   . Sleep apnea   . Stage 1 breast cancer, ER+, right ([place]) 07/04/2017    Patient Active Problem List     Diagnosis Date Noted  . Osteopenia 04/15/2018  . Dyslipidemia 02/02/2018  . Gastroesophageal reflux disease without esophagitis 02/02/2018  . Primary osteoarthritis of left knee 10/17/2017  . History of right knee joint replacement 10/17/2017  . Pain in both lower extremities 08/02/2017  . Stage 1 breast cancer, ER+, right ([place]) 07/04/2017  . 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 Paresthesia 02/09/2017  . Low back pain 02/09/2017  . Gait abnormality 02/09/2017  . OA (osteoarthritis) of hip 05/09/2015    Past Surgical History:  Procedure Laterality Date  . [place] STUDY N/A 06/21/2018   Procedure: [place] STUDY;  Surgeon: Lavena Bullion, DO;  Location: [facility];  Service: Gastroenterology;  Laterality: N/A;  with impedance  . ABDOMINAL HYSTERECTOMY  2010  . APPENDECTOMY  1949  . [place]  . BREAST SURGERY    . CATARACT EXTRACTION Bilateral 2011  . CHOLECYSTECTOMY    . ESOPHAGEAL MANOMETRY N/A 06/21/2018   Procedure: ESOPHAGEAL MANOMETRY (EM);  Surgeon: Lavena Bullion, DO;  Location: [facility];  Service: Gastroenterology;  Laterality: N/A;  . GALLBLADDER SURGERY  2015  . JOINT REPLACEMENT     RT TOTAL HIP /  RT TOTAL KNEE  . MASTECTOMY  1998   BILATERAL   . [place] IMPEDANCE STUDY  06/21/2018   Procedure: [place] IMPEDANCE STUDY;  Surgeon: Lavena Bullion, DO;  Location: [facility];  Service: Gastroenterology;;  . SKIN CANCER EXCISION  2016   RT SIDE OF NOSE  . TONSILLECTOMY  1953  . TOTAL HIP ARTHROPLASTY  2010   RIGHT  . TOTAL HIP ARTHROPLASTY Left 05/09/2015   Procedure: LEFT TOTAL HIP ARTHROPLASTY ANTERIOR APPROACH;  Surgeon: Gaynelle Arabian, MD;  Location: [facility];  Service: Orthopedics;  Laterality: Left;  . TOTAL KNEE ARTHROPLASTY  2001  . TUMOR REMOVAL  2012   ABDOMINAL - NON CANCEROUS     OB History   No obstetric history on file.      Home Medications    Prior to Admission medications   Medication Sig Start Date End  Date Taking? Authorizing Provider  Alum & Mag Hydroxide-Simeth (GI COCKTAIL) SUSP suspension Take 10 mLs by mouth 2 (two) times daily as needed for indigestion. Shake well. 02/02/18   Copland, Gay Filler, MD  AMBULATORY NON FORMULARY MEDICATION Take 10 mLs by mouth every 4 (four) hours as needed. Medication Name: Viscous Lidocaine, Bentyl, Maalox (equal parts) Take 48mL by mouth everey 4 hours as needed. 05/08/18   Cirigliano, Vito V, DO  atorvastatin (LIPITOR) 20 MG tablet Take 20 mg by mouth daily.    [provider]  baclofen (LIORESAL) 10 MG tablet Take 0.5-1 tablets (5-10 mg total) by mouth at bedtime as needed for muscle spasms. 01/20/18   Bayard Hugger, NP  CALCIUM PO Take 1,200 mg by mouth daily.    [provider]  Cholecalciferol (VITAMIN D) 2000 units tablet Take 2,000 Units by mouth. 06/02/17   [provider]  conjugated estrogens (PREMARIN) vaginal cream Place 0.625 mg vaginally daily.    [provider]  cyclobenzaprine (FLEXERIL) 5 MG tablet 0.5  Tablet at bedtime as needed for muscle spasm 06/02/18   Danella Sensing L, NP  diclofenac sodium (VOLTAREN) 1 % GEL Apply 1 application topically 3 (three) times daily. To feet, ankles, and knees. 10/17/17   Meredith Staggers, MD  ergocalciferol (VITAMIN D2) 1.25 MG (50000 UT) capsule Take 1 capsule (50,000 Units total) by mouth once a week. 04/19/18   [name], MD  HYDROcodone-acetaminophen (NORCO) 7.5-325 MG tablet Take 1 tablet by mouth every 6 (six) hours as needed for moderate pain. 06/02/18   Bayard Hugger, NP  LINZESS 290 MCG CAPS capsule Take 1 capsule by mouth daily. 01/01/17   [provider]  losartan (COZAAR) 25 MG tablet Take 25 mg by mouth daily. 12/26/16   [provider]  ondansetron (ZOFRAN-ODT) 4 MG disintegrating tablet as needed. 01/01/17   [provider]  pregabalin (LYRICA) 100 MG capsule Take 1 capsule (100 mg total) by mouth 3 (three) times daily. 02/20/18    Bayard Hugger, NP    Family History Family History  Problem Relation Age of Onset  . Other Mother        complications from flu  . Other Father        unsure of cause  . Colon cancer Neg Hx     Social History Social History   Tobacco Use  . Smoking status: Former Smoker    Last attempt to quit: 02/03/1976    Years since quitting: 42.4  . Smokeless tobacco: Never Used  Substance Use Topics  . Alcohol use: No  .  Drug use: No     Allergies   Morphine and related   Review of Systems Review of Systems  Constitutional: Negative for fever.  Gastrointestinal: Negative for nausea and vomiting.  Musculoskeletal: Positive for arthralgias. Negative for back pain, joint swelling and neck pain.       Pelvic pain  Skin: Negative for wound.  Neurological: Negative for weakness, numbness and headaches.     Physical Exam Updated Vital Signs BP (!) 141/61 (BP Location: Right Arm)   Pulse 82   Temp 97.7 F (36.5 C) (Oral)   Resp 18   Ht 5\' 6"  (1.676 m)   Wt 106.6 kg   SpO2 96%   BMI 37.93 kg/m   Physical Exam Constitutional:      Appearance: She is well-developed.  HENT:     Head: Normocephalic and atraumatic.  Neck:     Musculoskeletal: Normal range of motion and neck supple.  Cardiovascular:     Rate and Rhythm: Normal rate.  Pulmonary:     Effort: Pulmonary effort is normal.  Musculoskeletal:        General: Tenderness present.     Comments: Patient has tenderness on palpation of the lower lumbar region as well as the pelvis.  She has nonpitting edema in her lower extremities.  No joint effusions are noted.  She has normal sensation and motor function in her lower extremities.  Pedal pulses are intact.  Skin:    General: Skin is warm and dry.  Neurological:     Mental Status: She is alert and oriented to person, place, and time.      ED Treatments / Results  Labs (all labs ordered are listed, but only abnormal results are displayed) Labs Reviewed - No  data to display  EKG None  Radiology Dg Pelvis 1-2 Views  Result Date: 06/26/2018 CLINICAL DATA:  Previous pelvic fracture EXAM: PELVIS - 1-2 VIEW COMPARISON:  05/09/2015 FINDINGS: Bilateral total hip arthroplasty are anatomically aligned. No breakage or loosening of the hardware. No acute fracture or dislocation. Degenerative changes in the lower lumbar spine. IMPRESSION: No acute bony pathology. Postoperative changes and degenerative changes are noted. Electronically Signed   By: Marybelle Killings M.D.   On: 06/26/2018 10:09    Procedures Procedures (including critical care time)  Medications Ordered in ED Medications  HYDROmorphone (DILAUDID) injection 0.5 mg (0.5 mg Intramuscular Given 06/26/18 0952)     Initial Impression / Assessment and Plan / ED Course  I have reviewed the triage vital signs and the nursing notes.  Pertinent labs & imaging results that were available during my care of the patient were reviewed by me and considered in my medical decision making (see chart for details).     Patient is a 85 year old female who presents with pain in her pelvic bones.  She currently is being treated for superior pubic ramus fracture.  She is on chronic pain medications and is followed by pain management.  She says that her pain got worse over the weekend.  She is feeling much better after an injection of Dilaudid.  Her x-ray does not show any acute findings.  She is neurologically intact.  She was discharged home in good condition.  She was encouraged to follow-up with her pain management doctor.  Return precautions were given.  Final Clinical Impressions(s) / ED Diagnoses   Final diagnoses:  Pelvic pain    ED Discharge Orders    None       Malvin Johns, MD  06/26/18 1043  

## 2018-06-26 NOTE — ED Triage Notes (Signed)
Reports pelvic fractures on July 23 of last year.  States she continues to have pain and was seen by Dr. Maureen Ralphs 1 week ago and fractures have not healed.  Reports pain worsened over the weekend.  C/o bilateral hip and pelvic pain.

## 2018-06-27 DIAGNOSIS — R12 Heartburn: Secondary | ICD-10-CM

## 2018-06-29 ENCOUNTER — Encounter: Payer: Self-pay | Admitting: Registered Nurse

## 2018-06-29 ENCOUNTER — Encounter (HOSPITAL_BASED_OUTPATIENT_CLINIC_OR_DEPARTMENT_OTHER): Payer: Medicare Other | Admitting: Registered Nurse

## 2018-06-29 VITALS — BP 134/78 | HR 67 | Ht 66.0 in | Wt 240.0 lb

## 2018-06-29 DIAGNOSIS — Z79899 Other long term (current) drug therapy: Secondary | ICD-10-CM

## 2018-06-29 DIAGNOSIS — M2550 Pain in unspecified joint: Secondary | ICD-10-CM

## 2018-06-29 DIAGNOSIS — Z5181 Encounter for therapeutic drug level monitoring: Secondary | ICD-10-CM

## 2018-06-29 DIAGNOSIS — M7918 Myalgia, other site: Secondary | ICD-10-CM | POA: Diagnosis not present

## 2018-06-29 DIAGNOSIS — Z96651 Presence of right artificial knee joint: Secondary | ICD-10-CM | POA: Diagnosis not present

## 2018-06-29 DIAGNOSIS — M79605 Pain in left leg: Secondary | ICD-10-CM | POA: Diagnosis not present

## 2018-06-29 DIAGNOSIS — M7062 Trochanteric bursitis, left hip: Secondary | ICD-10-CM | POA: Diagnosis not present

## 2018-06-29 DIAGNOSIS — Z853 Personal history of malignant neoplasm of breast: Secondary | ICD-10-CM | POA: Diagnosis not present

## 2018-06-29 DIAGNOSIS — M62838 Other muscle spasm: Secondary | ICD-10-CM | POA: Diagnosis not present

## 2018-06-29 DIAGNOSIS — M1712 Unilateral primary osteoarthritis, left knee: Secondary | ICD-10-CM | POA: Diagnosis not present

## 2018-06-29 DIAGNOSIS — M7061 Trochanteric bursitis, right hip: Secondary | ICD-10-CM

## 2018-06-29 DIAGNOSIS — M5416 Radiculopathy, lumbar region: Secondary | ICD-10-CM | POA: Diagnosis not present

## 2018-06-29 DIAGNOSIS — R202 Paresthesia of skin: Secondary | ICD-10-CM | POA: Diagnosis not present

## 2018-06-29 DIAGNOSIS — G8929 Other chronic pain: Secondary | ICD-10-CM | POA: Diagnosis not present

## 2018-06-29 DIAGNOSIS — M79604 Pain in right leg: Secondary | ICD-10-CM | POA: Diagnosis not present

## 2018-06-29 DIAGNOSIS — G894 Chronic pain syndrome: Secondary | ICD-10-CM

## 2018-06-29 NOTE — Progress Notes (Signed)
Subjective:    Patient ID: Crystal Lee, female    DOB: 29-Aug-1933, 85 y.o.   MRN: [id number]  HPI: Crystal Lee is a 85 y.o. female who returns for follow up appointment for chronic pain and medication refill.She states her  pain is located in her lower back radiating into her bilateral hips and bilateral lower extremities. She rates her pain 6. Her  current exercise regime is walking.   Crystal [facility] on 06/26/2018, she received Hydromorphone injection. Note was reviewed. .  Crystal Lee Morphine equivalent is 30.00  MME. Last Oral Swab was Performed on 06/02/2018, it was consistent.    Pain Inventory Average Pain 6 Pain Right Now 6 My pain is burning, tingling and aching  In the last 24 hours, has pain interfered with the following? General activity 7 Relation with others 7 Enjoyment of life 7 What TIME of day is your pain at its worst? evening Sleep (in general) Poor  Pain is worse with: walking, bending, standing and some activites Pain improves with: rest, heat/ice and medication Relief from Meds: 5  Mobility walk with assistance use a cane use a walker ability to climb steps?  no do you drive?  yes Do you have any goals in this area?  yes  Function not employed: date last employed . retired I need assistance with the following:  dressing and bathing Do you have any goals in this area?  yes  Neuro/Psych tingling trouble walking  Prior Studies Any changes since last visit?  no  Physicians involved in your care Any changes since last visit?  no   Family History  Problem Relation Age of Onset  . Other Mother        complications from flu  . Other Father        unsure of cause  . Colon cancer Neg Hx    Social History   Socioeconomic History  . Marital status: Married    Spouse name: Not on file  . Number of children: 3  . Years of education: 16 years  . Highest education level: Not on file  Occupational History   . Occupation: Retired  Scientific laboratory technician  . Financial resource strain: Not on file  . Food insecurity:    Worry: Not on file    Inability: Not on file  . Transportation needs:    Medical: Not on file    Non-medical: Not on file  Tobacco Use  . Smoking status: Former Smoker    Last attempt to quit: 02/03/1976    Years since quitting: 42.4  . Smokeless tobacco: Never Used  Substance and Sexual Activity  . Alcohol use: No  . Drug use: No  . Sexual activity: Yes    Birth control/protection: Post-menopausal  Lifestyle  . Physical activity:    Days per week: Not on file    Minutes per session: Not on file  . Stress: Not on file  Relationships  . Social connections:    Talks on phone: Not on file    Gets together: Not on file    Attends religious service: Not on file    Active member of club or organization: Not on file    Attends meetings of clubs or organizations: Not on file    Relationship status: Not on file  Other Topics Concern  . Not on file  Social History Narrative   Lives at home with husband.   Right-handed.   No caffeine  use.   Past Surgical History:  Procedure Laterality Date  . [place] STUDY N/A 06/21/2018   Procedure: [place] STUDY;  Surgeon: Lavena Bullion, DO;  Location: [facility];  Service: Gastroenterology;  Laterality: N/A;  with impedance  . ABDOMINAL HYSTERECTOMY  2010  . APPENDECTOMY  1949  . [place]  . BREAST SURGERY    . CATARACT EXTRACTION Bilateral 2011  . CHOLECYSTECTOMY    . ESOPHAGEAL MANOMETRY N/A 06/21/2018   Procedure: ESOPHAGEAL MANOMETRY (EM);  Surgeon: Lavena Bullion, DO;  Location: [facility];  Service: Gastroenterology;  Laterality: N/A;  . GALLBLADDER SURGERY  2015  . JOINT REPLACEMENT     RT TOTAL HIP / RT TOTAL KNEE  . MASTECTOMY  1998   BILATERAL   . [place] IMPEDANCE STUDY  06/21/2018   Procedure: [place] IMPEDANCE STUDY;  Surgeon: Lavena Bullion, DO;  Location: [facility];  Service: Gastroenterology;;  .  SKIN CANCER EXCISION  2016   RT SIDE OF NOSE  . TONSILLECTOMY  1953  . TOTAL HIP ARTHROPLASTY  2010   RIGHT  . TOTAL HIP ARTHROPLASTY Left 05/09/2015   Procedure: LEFT TOTAL HIP ARTHROPLASTY ANTERIOR APPROACH;  Surgeon: Gaynelle Arabian, MD;  Location: [facility];  Service: Orthopedics;  Laterality: Left;  . TOTAL KNEE ARTHROPLASTY  2001  . TUMOR REMOVAL  2012   ABDOMINAL - NON CANCEROUS   Past Medical History:  Diagnosis Date  . Arthritis   . Cancer (HCC)    HX BREAST CANCER/ SKIN CANCER  . Complication of anesthesia    N/V WITH MORPHINE  . Difficulty sleeping   . Fractured hip ([place])    LEFT - AUG 2016  . GERD (gastroesophageal reflux disease)   . Hyperlipidemia   . Hypertension   . Melanoma ([place])   . Neuropathy   . Nocturia   . Osteopenia   . 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 PONV (postoperative nausea and vomiting)    PT STATES MORPHINE CAUSED N/V  . Rosacea   . Sleep apnea   . Stage 1 breast cancer, ER+, right (HCC) 07/04/2017   BP 134/78   Pulse 67   Ht 5\' 6"  (1.676 m)   Wt 240 lb (108.9 kg)   SpO2 94%   BMI 38.74 kg/m   Opioid Risk Score:   Fall Risk Score:  `1  Depression screen PHQ 2/9  Depression screen [facility] 2/9 06/02/2018 04/10/2018 02/20/2018 10/17/2017  Decreased Interest 0 0 0 0  Down, Depressed, Hopeless 0 0 0 0  PHQ - 2 Score 0 0 0 0  Altered sleeping - - - 3  Tired, decreased energy - - - 3  Change in appetite - - - 0  Feeling bad or failure about yourself  - - - 0  Trouble concentrating - - - 0  Moving slowly or fidgety/restless - - - 0  Suicidal thoughts - - - 0  PHQ-9 Score - - - 6  Difficult doing work/chores - - - Somewhat difficult    Review of Systems  Constitutional: Negative.   HENT: Negative.   Eyes: Negative.   Respiratory: Negative.   Cardiovascular: Positive for leg swelling.  Gastrointestinal: Positive for constipation.  Endocrine: Negative.   Genitourinary: Negative.   Musculoskeletal: Positive for back pain and  gait problem.  Skin: Negative.   Allergic/Immunologic: Negative.   Neurological:       Tingling  Hematological: Negative.   Psychiatric/Behavioral: Negative.  Objective:   Physical Exam Vitals signs and nursing note reviewed.  Constitutional:      Appearance: Normal appearance.  Neck:     Musculoskeletal: Normal range of motion and neck supple.  Cardiovascular:     Rate and Rhythm: Normal rate and regular rhythm.     Pulses: Normal pulses.     Heart sounds: Normal heart sounds.  Pulmonary:     Effort: Pulmonary effort is normal.     Breath sounds: Normal breath sounds.  Musculoskeletal:     Comments: Normal Muscle Bulk and Muscle Testing Reveals:  Upper Extremities: Full ROM and Muscle Strength 5/5 , Lumbar Hypersensitivity Bilateral Greater Trochanter Tenderness  Lower Extremities: Full ROM and Muscle Strength 5/5 Arises from chair slowly using walker for support Narrow Based Gait   Skin:    General: Skin is warm and dry.  Neurological:     Mental Status: She is alert and oriented to person, place, and time.  Psychiatric:        Mood and Affect: Mood normal.        Behavior: Behavior normal.           Assessment & Plan:  1. Chronic Bilateral Leg pain: Paresthesia: Continue Lyrica. Continue to Monitor.06/29/2018 2. Lumbar Radiculitis: Continue Lyrica. 06/29/2018 3. Acute Pain of Left Wrist/: No complaints Today:S/P Carpal Tunnel Release on 06/03/2017 by Dr. Ellene Route. Dr. Ellene Route Following. 06/29/2018. 4. Fracture of superior pubic ramus.Dr. AluisioFollowing.Continue to monitor. 06/29/2018. 3. Left Knee OA: Continue Voltaren Gel.Continue to monitor. Orthopedist following. 06/29/2018. 4. Polyarthralgia: Continue to alternate with heat and ice therapy. Continue current medication regime. Continue to monitor.06/29/2018. 5. Chronic Pain Syndrome: Continue Hydrocodone7.5/325 mg one tablet every 6 hours as needed for moderate pain #120.06/02/2018.  20  minutesof face to face patient care time was spent during this visit. All questions were encouraged and answered.  F/U in 1 month

## 2018-06-30 ENCOUNTER — Encounter: Payer: Medicare Other | Admitting: Registered Nurse

## 2018-07-03 ENCOUNTER — Telehealth: Payer: Self-pay | Admitting: Registered Nurse

## 2018-07-03 ENCOUNTER — Encounter: Payer: Medicare Other | Admitting: Physical Medicine & Rehabilitation

## 2018-07-03 MED ORDER — HYDROCODONE-ACETAMINOPHEN 7.5-325 MG PO TABS: 1.0000 | ORAL_TABLET | Freq: Four times a day (QID) | ORAL | 0 refills | Status: DC | PRN

## 2018-07-03 NOTE — Telephone Encounter (Signed)
Hydrocodone e-scribed.

## 2018-07-04 ENCOUNTER — Encounter: Payer: Medicare Other | Admitting: Physical Medicine & Rehabilitation

## 2018-07-11 ENCOUNTER — Ambulatory Visit: Payer: Medicare Other | Admitting: Gastroenterology

## 2018-07-11 ENCOUNTER — Ambulatory Visit (INDEPENDENT_AMBULATORY_CARE_PROVIDER_SITE_OTHER): Payer: Medicare Other | Admitting: Gastroenterology

## 2018-07-11 ENCOUNTER — Encounter: Payer: Self-pay | Admitting: Gastroenterology

## 2018-07-11 ENCOUNTER — Other Ambulatory Visit (INDEPENDENT_AMBULATORY_CARE_PROVIDER_SITE_OTHER): Payer: Medicare Other

## 2018-07-11 VITALS — BP 142/74 | HR 88 | Ht 66.0 in | Wt 238.5 lb

## 2018-07-11 DIAGNOSIS — K5900 Constipation, unspecified: Secondary | ICD-10-CM | POA: Diagnosis not present

## 2018-07-11 DIAGNOSIS — K219 Gastro-esophageal reflux disease without esophagitis: Secondary | ICD-10-CM | POA: Diagnosis not present

## 2018-07-11 DIAGNOSIS — R1032 Left lower quadrant pain: Secondary | ICD-10-CM | POA: Diagnosis not present

## 2018-07-11 DIAGNOSIS — R748 Abnormal levels of other serum enzymes: Secondary | ICD-10-CM | POA: Diagnosis not present

## 2018-07-11 DIAGNOSIS — Z86010 Personal history of colonic polyps: Secondary | ICD-10-CM

## 2018-07-11 LAB — CBC WITH DIFFERENTIAL/PLATELET
Basophils Absolute: 0 10*3/uL (ref 0.0–0.1)
Basophils Relative: 0.2 % (ref 0.0–3.0)
EOS ABS: 0.1 10*3/uL (ref 0.0–0.7)
Eosinophils Relative: 2 % (ref 0.0–5.0)
HCT: 39 % (ref 36.0–46.0)
Hemoglobin: 12.8 g/dL (ref 12.0–15.0)
Lymphocytes Relative: 27.9 % (ref 12.0–46.0)
Lymphs Abs: 1.6 10*3/uL (ref 0.7–4.0)
MCHC: 32.8 g/dL (ref 30.0–36.0)
MCV: 87.4 fl (ref 78.0–100.0)
Monocytes Absolute: 0.5 10*3/uL (ref 0.1–1.0)
Monocytes Relative: 8.6 % (ref 3.0–12.0)
Neutro Abs: 3.5 10*3/uL (ref 1.4–7.7)
Neutrophils Relative %: 61.3 % (ref 43.0–77.0)
Platelets: 145 10*3/uL — ABNORMAL LOW (ref 150.0–400.0)
RBC: 4.47 Mil/uL (ref 3.87–5.11)
RDW: 14.1 % (ref 11.5–15.5)
WBC: 5.7 10*3/uL (ref 4.0–10.5)

## 2018-07-11 LAB — BASIC METABOLIC PANEL
BUN: 12 mg/dL (ref 6–23)
CO2: 29 mEq/L (ref 19–32)
Calcium: 9.3 mg/dL (ref 8.4–10.5)
Chloride: 102 mEq/L (ref 96–112)
Creatinine, Ser: 0.56 mg/dL (ref 0.40–1.20)
GFR: 103.45 mL/min (ref >60.00)
Glucose, Bld: 95 mg/dL (ref 70–99)
POTASSIUM: 4.1 meq/L (ref 3.5–5.1)
Sodium: 138 mEq/L (ref 135–145)

## 2018-07-11 NOTE — Patient Instructions (Signed)
If you are age 83 or older, your body mass index should be between 23-30. Your Body mass index is 38.49 kg/m. If this is out of the aforementioned range listed, please consider follow up with your Primary Care Provider.  If you are age 66 or younger, your body mass index should be between 19-25. Your Body mass index is 38.49 kg/m. If this is out of the aformentioned range listed, please consider follow up with your Primary Care Provider.   You have been scheduled [facility] CT [facility] [place] [place] [address] [address]   You are scheduled on 07/17/18 at 9:30am. You should arrive 15 minutes prior to your appointment time for registration. Please follow the written instructions below on the day of your exam:  WARNING: IF YOU ARE ALLERGIC TO IODINE/X-RAY DYE, PLEASE NOTIFY RADIOLOGY IMMEDIATELY AT [phone]! YOU WILL BE GIVEN A 13 HOUR PREMEDICATION PREP.  1) Do not eat or drink anything after 5:30am (4 hours prior to your test) 2) You have been given 2 bottles of oral contrast to drink. The solution may taste better if refrigerated, but do NOT add ice or any other liquid to this solution. Shake well before drinking.    Drink 1 bottle of contrast @ 7:30am (2 hours prior to your exam)  Drink 1 bottle of contrast @ 8:30am (1 hour prior to your exam)  You may take any medications as prescribed with a small amount of water, if necessary. If you take any of the following medications: METFORMIN, GLUCOPHAGE, GLUCOVANCE, AVANDAMET, RIOMET, FORTAMET, [place] MET, JANUMET, GLUMETZA or METAGLIP, you MAY be asked to HOLD this medication 48 hours AFTER the exam.  The purpose of you drinking the oral contrast is to aid in the visualization of your intestinal tract. The contrast solution may cause some diarrhea. Depending on your individual set of symptoms, you may also receive an intravenous injection of x-ray contrast/dye. Plan on  being at [facility] for 30 minutes or longer, depending on the type of exam you are having performed.  This test typically takes 30-45 minutes to complete.  If you have any questions regarding your exam or if you need to reschedule, you may call the CT department at [phone] between the hours of 8:00 am and 5:00 pm, Monday-Friday.  ________________________________________________________________________  Please call our office at [phone] to set up your 3 month follow up visit.  Please go to the lab on the 2nd floor [address] before you leave the office today.   It was a pleasure to see you today!  Vito Cirigliano, D.O.

## 2018-07-11 NOTE — Progress Notes (Signed)
P  Chief Complaint:    LLQ pain, GERD, chronic constipation  GI History:  1) GERD: Index sxs of belching along with NCCP.  Has been present for over 20 years.  Has been evaluated in ER several times for these sxs with negative cardiac w/u.  Previously treated with Protonix and GI cocktail. She stopped Protonix due to lack of efficacy and treated with prn GI cocktail alone, which resolves sxs. Has also trialed omeprazole, esomeprazole, Zantac all without any improvement in sxs. No dysphagia or odynophagia. Sxs occur 2-3 times/week. Tried bland diet diet with some improvement. Worse with coffee, chocolate, spicy foods, tomato based sauces.  EM and pH/impedance in 05/2018 both normal with JD score of 4.  2) Constipation: Long standing history of constipation. Has been taking Linzess periodically for years, which she will take if sxs worsen.  Otherwise, can generally maintain regular bowel habits with dietary modifications. Vit D 34 in Feb 2019.  She does have a history of osteoporosis along with recent pelvic fracture.  Started on vitamin D last month.  Constipation worsened with prior calcium supplement; has since stopped.  Endoscopic history: - Colonoscopy (09/2015, [place], [place]): 2 subcentimeter tubular adenomas.  Recommended repeat in 5 years for ongoing surveillance. -EGD (03/2015, [place], [place]): Non-H. pylori gastritis, gastric polyp.  HPI:     Patient is a 85 y.o. female presenting to the Gastroenterology Clinic for follow-up.  She was initially seen by me on 05/08/2018 for GERD and constipation.  GERD symptoms otherwise well controlled on current therapy as above.  Constipation controlled as well.  Today she states she has low back pain with radiation into LLQ, starting 3 weeks ago. Was evaluated in ER on 06/26/18 with unrevealing w/u. Xray negative for fx. No labs.  Was sent home with pain medication. Followed with PM&R on 1/30, with no change in therapy or evaluation.  Additionally, having  fecal urgency with "floating stools" over the last 2 to 3 weeks.  No BRBPR or melena. No f/c/n/v.  No fecal seepage or incontinence.  Reviewed rcent EM and pH/MII study results with her: EM normal. PH/MII normal with JD score of 4 and time pH <4 was 0.8% only. She states that she avoided eating during the study due to gag reflex from probe sitting in place. Since that time has had recurrence of index reflux sxs.   Review of systems:     No chest pain, no SOB, no fevers, no urinary sx   Past Medical History:  Diagnosis Date  . Arthritis   . Cancer (HCC)    HX BREAST CANCER/ SKIN CANCER  . Complication of anesthesia    N/V WITH MORPHINE  . Difficulty sleeping   . Fractured hip ([place])    LEFT - AUG 2016  . GERD (gastroesophageal reflux disease)   . Hyperlipidemia   . Hypertension   . Melanoma ([place])   . Neuropathy   . Nocturia   . Osteopenia   . 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 PONV (postoperative nausea and vomiting)    PT STATES MORPHINE CAUSED N/V  . Rosacea   . Sleep apnea   . Stage 1 breast cancer, ER+, right ([place]) 07/04/2017    Patient's surgical history, family medical history, social history, medications and allergies were all reviewed in Epic    Current Outpatient Medications  Medication Sig Dispense Refill  . Alum & Mag Hydroxide-Simeth (GI COCKTAIL) SUSP suspension Take 10 mLs by mouth 2 (two)  times daily as needed for indigestion. Shake well. 900 mL 2  . AMBULATORY NON FORMULARY MEDICATION Take 10 mLs by mouth every 4 (four) hours as needed. Medication Name: Viscous Lidocaine, Bentyl, Maalox (equal parts) Take 96m by mouth everey 4 hours as needed. 120 mL 3  . atorvastatin (LIPITOR) 20 MG tablet Take 20 mg by mouth daily.    . baclofen (LIORESAL) 10 MG tablet Take 0.5-1 tablets (5-10 mg total) by mouth at bedtime as needed for muscle spasms. 90 tablet 3  . CALCIUM PO Take 1,200 mg by mouth daily.    . Cholecalciferol (VITAMIN D) 2000 units tablet  Take 2,000 Units by mouth.    . cyclobenzaprine (FLEXERIL) 5 MG tablet 0.5  Tablet at bedtime as needed for muscle spasm 20 tablet 2  . diclofenac sodium (VOLTAREN) 1 % GEL Apply 1 application topically 3 (three) times daily. To feet, ankles, and knees. 3 Tube 4  . ergocalciferol (VITAMIN D2) 1.25 MG (50000 UT) capsule Take 1 capsule (50,000 Units total) by mouth once a week. 12 capsule 6  . HYDROcodone-acetaminophen (NORCO) 7.5-325 MG tablet Take 1 tablet by mouth every 6 (six) hours as needed for moderate pain. 120 tablet 0  . LINZESS 290 MCG CAPS capsule Take 1 capsule by mouth daily.    .Marland Kitchenlosartan (COZAAR) 25 MG tablet Take 25 mg by mouth daily.    . ondansetron (ZOFRAN-ODT) 4 MG disintegrating tablet as needed.    . pregabalin (LYRICA) 100 MG capsule Take 1 capsule (100 mg total) by mouth 3 (three) times daily. 270 capsule 1  . tobramycin-dexamethasone (TOBRADEX) ophthalmic solution tobramycin 0.3 %-dexamethasone 0.1 % eye drops,suspension  INSTILL 1 DROP INTO RIGHT EYE TWICE DAILY FOR 2 WEEKS     No current facility-administered medications for this visit.     Physical Exam:     BP (!) 142/74   Pulse 88   Ht '5\' 6"'$  (1.676 m)   Wt 238 lb 8 oz (108.2 kg)   BMI 38.49 kg/m   GENERAL:  Pleasant female in NAD PSYCH: : Cooperative, normal affect EENT:  conjunctiva pink, mucous membranes moist, neck supple without masses CARDIAC:  RRR, no murmur heard, no peripheral edema PULM: Normal respiratory effort, lungs CTA bilaterally, no wheezing ABDOMEN: Mild TTP in LLQ without rebound or guarding.  Nondistended, soft. No obvious masses, no hepatomegaly,  normal bowel sounds SKIN:  turgor, no lesions seen Musculoskeletal:  Normal muscle tone, normal strength NEURO: Alert and oriented x 3, no focal neurologic deficits   IMPRESSION and PLAN:    #1.  GERD: Index reflux symptoms currently controlled.  Had previously discussed trialing alternate acid suppression medication (i.e. Dexilant).   She like to hold off on new medication in favor of treating the more acute issues as below.  Otherwise recent pH/impedance study was normal with GAD score 4.  She states that she hardly ate over the duration of the study, which could certainly impact the quality of study.  Otherwise EM was normal.     #2.  LLQ pain: Recent onset lower back pain with radiation to the LLQ.  Does have some change in bowel habits associated with this.  Evaluate further as below:  - CT abdomen/pelvis with contrast - UA, CBC, BMP - For associated change in bowel habits, will check fecal elastase, fecal fat - If unrevealing, recommend close f/u with PCM - If GU etiology, referral to urology as appropriate  #3.  Constipation: Chronic constipation generally well controlled with  dietary modifications and intermittent use of Linzess.  -Resume Linzess -Resume high-fiber diet and increased fluid intake along with dietary modifications  #4. Elevated ALP: 130 on recent panel in the setting of active/recent pelvic fractures. Otherwise normal TBili.  On review of prior labs, was 105 in 05/2017, 146 in 05/2015 and otherwise in the 80s. - Repeat in 6 months.  If still elevated, can consider GGT, alk phos fractionization and/or 5' nucleotidase and if elevated then MRCP to rule out biliary etiology  #5.  History of Tubular Adenomas: Patient brought previous medical records with her today for review.  Colonoscopy in 2017 notable for 2 subcentimeter tubular adenomas with recommendation repeat 5 years for ongoing surveillance.  Can discuss utility of repeat colonoscopy for ongoing surveillance in 2022.  RTC in 3 months or sooner as needed  I spent a total of 25 minutes of face-to-face time with the patient. Greater than 50% of the time was spent counseling and coordinating care.        Lavena Bullion ,DO, FACG 07/11/2018, 10:37 AM

## 2018-07-12 ENCOUNTER — Other Ambulatory Visit: Payer: Medicare Other

## 2018-07-12 DIAGNOSIS — K219 Gastro-esophageal reflux disease without esophagitis: Secondary | ICD-10-CM

## 2018-07-12 DIAGNOSIS — R1032 Left lower quadrant pain: Secondary | ICD-10-CM | POA: Diagnosis not present

## 2018-07-12 DIAGNOSIS — K5900 Constipation, unspecified: Secondary | ICD-10-CM

## 2018-07-13 ENCOUNTER — Other Ambulatory Visit: Payer: Medicare Other

## 2018-07-13 DIAGNOSIS — R1032 Left lower quadrant pain: Secondary | ICD-10-CM | POA: Diagnosis not present

## 2018-07-13 DIAGNOSIS — K5900 Constipation, unspecified: Secondary | ICD-10-CM | POA: Diagnosis not present

## 2018-07-13 DIAGNOSIS — K219 Gastro-esophageal reflux disease without esophagitis: Secondary | ICD-10-CM

## 2018-07-13 LAB — URINALYSIS, ROUTINE W REFLEX MICROSCOPIC
Bilirubin Urine: NEGATIVE
Ketones, ur: NEGATIVE
Nitrite: POSITIVE — AB
Specific Gravity, Urine: 1.015 (ref 1.000–1.030)
Urine Glucose: NEGATIVE
Urobilinogen, UA: 0.2 (ref 0.0–1.0)
pH: 6 (ref 5.0–8.0)

## 2018-07-17 ENCOUNTER — Ambulatory Visit (HOSPITAL_BASED_OUTPATIENT_CLINIC_OR_DEPARTMENT_OTHER)
Admission: RE | Admit: 2018-07-17 | Discharge: 2018-07-17 | Disposition: A | Discharge status: Home / Self Care | Payer: Medicare Other | Source: Ambulatory Visit | Attending: Gastroenterology | Admitting: Gastroenterology

## 2018-07-17 ENCOUNTER — Encounter (HOSPITAL_BASED_OUTPATIENT_CLINIC_OR_DEPARTMENT_OTHER): Payer: Self-pay

## 2018-07-17 DIAGNOSIS — K5730 Diverticulosis of large intestine without perforation or abscess without bleeding: Secondary | ICD-10-CM | POA: Diagnosis not present

## 2018-07-17 DIAGNOSIS — R1032 Left lower quadrant pain: Secondary | ICD-10-CM | POA: Diagnosis not present

## 2018-07-17 DIAGNOSIS — K219 Gastro-esophageal reflux disease without esophagitis: Secondary | ICD-10-CM | POA: Diagnosis not present

## 2018-07-17 DIAGNOSIS — K5900 Constipation, unspecified: Secondary | ICD-10-CM

## 2018-07-17 MED ORDER — IOPAMIDOL (ISOVUE-300) INJECTION 61%
100.0000 mL | Freq: Once | INTRAVENOUS | Status: AC | PRN
  Administered 2018-07-17: 100 mL via INTRAVENOUS

## 2018-07-18 LAB — FECAL FAT, QUALITATIVE: FECAL FAT, QUALITATIVE: NORMAL

## 2018-07-20 LAB — PANCREATIC ELASTASE, FECAL: Pancreatic Elastase-1, Stool: 431 mcg/g

## 2018-07-21 ENCOUNTER — Other Ambulatory Visit: Payer: Self-pay

## 2018-07-21 MED ORDER — NITROFURANTOIN MONOHYD MACRO 100 MG PO CAPS: 100.0000 mg | ORAL_CAPSULE | Freq: Two times a day (BID) | ORAL | 0 refills | Status: DC

## 2018-07-21 NOTE — Progress Notes (Signed)
Notified patient of lab results. Sent in ABX to the pharmacy. Patient verbalized understanding of results and new order.

## 2018-07-31 ENCOUNTER — Encounter: Payer: Self-pay | Admitting: Physical Medicine & Rehabilitation

## 2018-07-31 ENCOUNTER — Encounter: Payer: Medicare Other | Attending: Physical Medicine & Rehabilitation | Admitting: Physical Medicine & Rehabilitation

## 2018-07-31 VITALS — BP 137/72 | HR 94 | Ht 66.0 in | Wt 237.0 lb

## 2018-07-31 DIAGNOSIS — K219 Gastro-esophageal reflux disease without esophagitis: Secondary | ICD-10-CM | POA: Diagnosis not present

## 2018-07-31 DIAGNOSIS — Z87891 Personal history of nicotine dependence: Secondary | ICD-10-CM | POA: Diagnosis not present

## 2018-07-31 DIAGNOSIS — M79604 Pain in right leg: Secondary | ICD-10-CM | POA: Insufficient documentation

## 2018-07-31 DIAGNOSIS — Z96651 Presence of right artificial knee joint: Secondary | ICD-10-CM | POA: Diagnosis not present

## 2018-07-31 DIAGNOSIS — I10 Essential (primary) hypertension: Secondary | ICD-10-CM | POA: Insufficient documentation

## 2018-07-31 DIAGNOSIS — Z8489 Family history of other specified conditions: Secondary | ICD-10-CM | POA: Insufficient documentation

## 2018-07-31 DIAGNOSIS — Z9013 Acquired absence of bilateral breasts and nipples: Secondary | ICD-10-CM | POA: Diagnosis not present

## 2018-07-31 DIAGNOSIS — Z8673 Personal history of transient ischemic attack (TIA), and cerebral infarction without residual deficits: Secondary | ICD-10-CM | POA: Diagnosis not present

## 2018-07-31 DIAGNOSIS — M1712 Unilateral primary osteoarthritis, left knee: Secondary | ICD-10-CM

## 2018-07-31 DIAGNOSIS — Z96643 Presence of artificial hip joint, bilateral: Secondary | ICD-10-CM | POA: Diagnosis not present

## 2018-07-31 DIAGNOSIS — G4730 Sleep apnea, unspecified: Secondary | ICD-10-CM | POA: Insufficient documentation

## 2018-07-31 DIAGNOSIS — F5101 Primary insomnia: Secondary | ICD-10-CM

## 2018-07-31 DIAGNOSIS — Z9889 Other specified postprocedural states: Secondary | ICD-10-CM | POA: Insufficient documentation

## 2018-07-31 DIAGNOSIS — M961 Postlaminectomy syndrome, not elsewhere classified: Secondary | ICD-10-CM | POA: Diagnosis not present

## 2018-07-31 DIAGNOSIS — E785 Hyperlipidemia, unspecified: Secondary | ICD-10-CM | POA: Insufficient documentation

## 2018-07-31 DIAGNOSIS — M79605 Pain in left leg: Secondary | ICD-10-CM | POA: Diagnosis not present

## 2018-07-31 DIAGNOSIS — Z9049 Acquired absence of other specified parts of digestive tract: Secondary | ICD-10-CM | POA: Diagnosis not present

## 2018-07-31 DIAGNOSIS — Z853 Personal history of malignant neoplasm of breast: Secondary | ICD-10-CM | POA: Insufficient documentation

## 2018-07-31 DIAGNOSIS — Z85820 Personal history of malignant melanoma of skin: Secondary | ICD-10-CM | POA: Insufficient documentation

## 2018-07-31 DIAGNOSIS — M8580 Other specified disorders of bone density and structure, unspecified site: Secondary | ICD-10-CM | POA: Insufficient documentation

## 2018-07-31 DIAGNOSIS — M7918 Myalgia, other site: Secondary | ICD-10-CM | POA: Diagnosis not present

## 2018-07-31 DIAGNOSIS — M5416 Radiculopathy, lumbar region: Secondary | ICD-10-CM

## 2018-07-31 DIAGNOSIS — G8929 Other chronic pain: Secondary | ICD-10-CM | POA: Insufficient documentation

## 2018-07-31 HISTORY — DX: Postlaminectomy syndrome, not elsewhere classified: M96.1

## 2018-07-31 MED ORDER — HYDROCODONE-ACETAMINOPHEN 7.5-325 MG PO TABS: 1.0000 | ORAL_TABLET | Freq: Four times a day (QID) | ORAL | 0 refills | Status: DC | PRN

## 2018-07-31 MED ORDER — TRAZODONE HCL 50 MG PO TABS: 25.0000 mg | ORAL_TABLET | Freq: Every day | ORAL | 2 refills | Status: DC

## 2018-07-31 NOTE — Patient Instructions (Signed)
PLEASE FEEL FREE TO CALL OUR OFFICE WITH ANY PROBLEMS OR QUESTIONS ([phone])      

## 2018-07-31 NOTE — Progress Notes (Signed)
Subjective:    Patient ID: Crystal Lee, female    DOB: 1933-07-26, 85 y.o.   MRN: [id number]  HPI   Crystal Lee is here in follow up of her chronic pain. Her husband just passed away in 08-13-22, and she is still grieving quite a bit. He had been ill for some time and had been in and out of the hospital.   Her pain levels have been an ongoing issue over this time and her sleep has been a problem as well. She can't seem to find a comfortable position in bed and ends up taking a lot of "naps" in her recliner. The baclofen helps with her night time cramps and somewhat with sleep, but doesn't do enough.   She maintains on norco 7.5 for pain control as well as lyrica 100mg  TID.   Pain Inventory Average Pain 6 Pain Right Now 6 My pain is sharp and burning  In the last 24 hours, has pain interfered with the following? General activity 7 Relation with others 0 Enjoyment of life 7 What TIME of day is your pain at its worst? all Sleep (in general) Poor  Pain is worse with: walking, bending and standing Pain improves with: rest, heat/ice and medication Relief from Meds: 4  Mobility use a cane use a walker ability to climb steps?  no do you drive?  yes use a wheelchair  Function retired I need assistance with the following:  dressing, household duties and shopping  Neuro/Psych trouble walking  Prior Studies Any changes since last visit?  no  Physicians involved in your care Any changes since last visit?  no   Family History  Problem Relation Age of Onset  . Other Mother        complications from flu  . Other Father        unsure of cause  . Colon cancer Neg Hx    Social History   Socioeconomic History  . Marital status: Married    Spouse name: Not on file  . Number of children: 3  . Years of education: 16 years  . Highest education level: Not on file  Occupational History  . Occupation: Retired  Scientific laboratory technician  . Financial resource strain: Not on file  . Food  insecurity:    Worry: Not on file    Inability: Not on file  . Transportation needs:    Medical: Not on file    Non-medical: Not on file  Tobacco Use  . Smoking status: Former Smoker    Last attempt to quit: 02/03/1976    Years since quitting: 42.5  . Smokeless tobacco: Never Used  Substance and Sexual Activity  . Alcohol use: No  . Drug use: No  . Sexual activity: Yes    Birth control/protection: Post-menopausal  Lifestyle  . Physical activity:    Days per week: Not on file    Minutes per session: Not on file  . Stress: Not on file  Relationships  . Social connections:    Talks on phone: Not on file    Gets together: Not on file    Attends religious service: Not on file    Active member of club or organization: Not on file    Attends meetings of clubs or organizations: Not on file    Relationship status: Not on file  Other Topics Concern  . Not on file  Social History Narrative   Lives at home with husband.   Right-handed.   No caffeine  use.   Past Surgical History:  Procedure Laterality Date  . [place] STUDY N/A 06/21/2018   Procedure: [place] STUDY;  Surgeon: Lavena Bullion, DO;  Location: [facility];  Service: Gastroenterology;  Laterality: N/A;  with impedance  . ABDOMINAL HYSTERECTOMY  2010  . APPENDECTOMY  1949  . [place]  . BREAST SURGERY    . CATARACT EXTRACTION Bilateral 2011  . CHOLECYSTECTOMY    . ESOPHAGEAL MANOMETRY N/A 06/21/2018   Procedure: ESOPHAGEAL MANOMETRY (EM);  Surgeon: Lavena Bullion, DO;  Location: [facility];  Service: Gastroenterology;  Laterality: N/A;  . GALLBLADDER SURGERY  2015  . JOINT REPLACEMENT     RT TOTAL HIP / RT TOTAL KNEE  . MASTECTOMY  1998   BILATERAL   . [place] IMPEDANCE STUDY  06/21/2018   Procedure: [place] IMPEDANCE STUDY;  Surgeon: Lavena Bullion, DO;  Location: [facility];  Service: Gastroenterology;;  . SKIN CANCER EXCISION  2016   RT SIDE OF NOSE  . TONSILLECTOMY  1953  . TOTAL HIP  ARTHROPLASTY  2010   RIGHT  . TOTAL HIP ARTHROPLASTY Left 05/09/2015   Procedure: LEFT TOTAL HIP ARTHROPLASTY ANTERIOR APPROACH;  Surgeon: Gaynelle Arabian, MD;  Location: [facility];  Service: Orthopedics;  Laterality: Left;  . TOTAL KNEE ARTHROPLASTY  2001  . TUMOR REMOVAL  2012   ABDOMINAL - NON CANCEROUS   Past Medical History:  Diagnosis Date  . Arthritis   . Cancer (HCC)    HX BREAST CANCER/ SKIN CANCER  . Complication of anesthesia    N/V WITH MORPHINE  . Difficulty sleeping   . Fractured hip ([place])    LEFT - AUG 2016  . GERD (gastroesophageal reflux disease)   . Hyperlipidemia   . Hypertension   . Melanoma ([place])   . Neuropathy   . Nocturia   . Osteopenia   . 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 PONV (postoperative nausea and vomiting)    PT STATES MORPHINE CAUSED N/V  . Rosacea   . Sleep apnea   . Stage 1 breast cancer, ER+, right (HCC) 07/04/2017   BP 137/72   Pulse 94   Ht 5\' 6"  (1.676 m)   Wt 237 lb (107.5 kg)   SpO2 93%   BMI 38.25 kg/m   Opioid Risk Score:   Fall Risk Score:  `1  Depression screen PHQ 2/9  Depression screen [facility] 2/9 06/02/2018 04/10/2018 02/20/2018 10/17/2017  Decreased Interest 0 0 0 0  Down, Depressed, Hopeless 0 0 0 0  PHQ - 2 Score 0 0 0 0  Altered sleeping - - - 3  Tired, decreased energy - - - 3  Change in appetite - - - 0  Feeling bad or failure about yourself  - - - 0  Trouble concentrating - - - 0  Moving slowly or fidgety/restless - - - 0  Suicidal thoughts - - - 0  PHQ-9 Score - - - 6  Difficult doing work/chores - - - Somewhat difficult    Review of Systems  Constitutional: Negative.   HENT: Negative.   Eyes: Negative.   Respiratory: Negative.   Cardiovascular: Negative.   Gastrointestinal: Negative.   Endocrine: Negative.   Genitourinary: Negative.   Musculoskeletal: Positive for arthralgias, back pain, gait problem and myalgias.  Skin: Negative.   Allergic/Immunologic: Negative.   Hematological:  Negative.   Psychiatric/Behavioral: Negative.   All other systems reviewed and are negative.  Objective:   Physical Exam General: No acute distress HEENT: EOMI, oral membranes moist Cards: reg rate  Chest: normal effort Abdomen: Soft, NT, ND Skin: dry, intact Extremities: no edema Neuro: Decreased light touch from the toes to the calfs.  Motor exam eventually intact except for pain inhibition.  Cognitively appropriate. Musculoskeletal: low back TTP, muscles taut on right more than left lumbar paraspinals. Very limited in ROM in all planes. Old surgical scar noted on back.  Psych: flat, tearful at times     Assessment & Plan:  1. Chronic bilateral leg pain. This appears to be primarily neuropathic in nature but I am fairly certain that there are other components to her pain including her knees. She is already had one knee replacement. The left knee as she states is "bone-on-bone". I suspect she is dealing with some osteoarthritis in her ankles and feet as well. I see no signs of vascular abnormality. 2. Lumbar post-laminectomy syndrome 3. Grieving reaction after the loss of her son.  Plan: 1. Continue Voltaren gel to knees legs and feet 3 times daily 2. Baclofen 10 mg nightly for cramping, spasms and sleep. 3.   Continue hydrocodone 5/325 one every 8 hours as needed #90.  CSA was signed today.  She has been on hydrocodone before and tolerated well.  I told her that we will attempt to keep this medication at a minimum moving forward. 4. Continue Lyrica as prescribed.    5.  For sleep, will begin low dose trazodone, 25-50mg  qhs. Hopefully this will help with her mood and pain. I did offer help with her grieving if she should choose.   I discussed with Crystal Tiner about how the stress/grief of her husband's illness and now his death can impact her from a pain level. I also reviewed that we have to be very careful regarding the doses and types of medications that we  use. She expressed an understanding. WE also discussed safety at home, and I suggested a tub seat might be a safer option for her in the shower.   Nurse practitioner will see the patient back in about 1 month's time.   15 minutes was spent today with the patient in counseling and direct contact.

## 2018-08-08 DIAGNOSIS — M659 Synovitis and tenosynovitis, unspecified: Secondary | ICD-10-CM | POA: Diagnosis not present

## 2018-08-08 DIAGNOSIS — M65842 Other synovitis and tenosynovitis, left hand: Secondary | ICD-10-CM | POA: Diagnosis not present

## 2018-08-17 DIAGNOSIS — Z5189 Encounter for other specified aftercare: Secondary | ICD-10-CM

## 2018-08-17 HISTORY — DX: Encounter for other specified aftercare: Z51.89

## 2018-08-31 ENCOUNTER — Other Ambulatory Visit: Payer: Self-pay

## 2018-08-31 ENCOUNTER — Encounter: Payer: Self-pay | Admitting: Registered Nurse

## 2018-08-31 ENCOUNTER — Encounter: Payer: Medicare Other | Attending: Physical Medicine & Rehabilitation | Admitting: Registered Nurse

## 2018-08-31 VITALS — Ht 66.0 in | Wt 237.0 lb

## 2018-08-31 DIAGNOSIS — M62838 Other muscle spasm: Secondary | ICD-10-CM

## 2018-08-31 DIAGNOSIS — M79605 Pain in left leg: Secondary | ICD-10-CM | POA: Insufficient documentation

## 2018-08-31 DIAGNOSIS — M961 Postlaminectomy syndrome, not elsewhere classified: Secondary | ICD-10-CM

## 2018-08-31 DIAGNOSIS — G894 Chronic pain syndrome: Secondary | ICD-10-CM | POA: Diagnosis not present

## 2018-08-31 DIAGNOSIS — R202 Paresthesia of skin: Secondary | ICD-10-CM | POA: Diagnosis not present

## 2018-08-31 DIAGNOSIS — M7918 Myalgia, other site: Secondary | ICD-10-CM | POA: Insufficient documentation

## 2018-08-31 DIAGNOSIS — Z5181 Encounter for therapeutic drug level monitoring: Secondary | ICD-10-CM

## 2018-08-31 DIAGNOSIS — M7061 Trochanteric bursitis, right hip: Secondary | ICD-10-CM | POA: Diagnosis not present

## 2018-08-31 DIAGNOSIS — M7062 Trochanteric bursitis, left hip: Secondary | ICD-10-CM | POA: Diagnosis not present

## 2018-08-31 DIAGNOSIS — I10 Essential (primary) hypertension: Secondary | ICD-10-CM | POA: Insufficient documentation

## 2018-08-31 DIAGNOSIS — G4730 Sleep apnea, unspecified: Secondary | ICD-10-CM | POA: Insufficient documentation

## 2018-08-31 DIAGNOSIS — K219 Gastro-esophageal reflux disease without esophagitis: Secondary | ICD-10-CM | POA: Insufficient documentation

## 2018-08-31 DIAGNOSIS — Z87891 Personal history of nicotine dependence: Secondary | ICD-10-CM | POA: Insufficient documentation

## 2018-08-31 DIAGNOSIS — Z79899 Other long term (current) drug therapy: Secondary | ICD-10-CM

## 2018-08-31 DIAGNOSIS — Z8489 Family history of other specified conditions: Secondary | ICD-10-CM | POA: Insufficient documentation

## 2018-08-31 DIAGNOSIS — M5416 Radiculopathy, lumbar region: Secondary | ICD-10-CM

## 2018-08-31 DIAGNOSIS — M2550 Pain in unspecified joint: Secondary | ICD-10-CM

## 2018-08-31 DIAGNOSIS — G8929 Other chronic pain: Secondary | ICD-10-CM | POA: Insufficient documentation

## 2018-08-31 DIAGNOSIS — Z9889 Other specified postprocedural states: Secondary | ICD-10-CM | POA: Insufficient documentation

## 2018-08-31 DIAGNOSIS — Z853 Personal history of malignant neoplasm of breast: Secondary | ICD-10-CM | POA: Insufficient documentation

## 2018-08-31 DIAGNOSIS — Z85820 Personal history of malignant melanoma of skin: Secondary | ICD-10-CM | POA: Insufficient documentation

## 2018-08-31 DIAGNOSIS — E785 Hyperlipidemia, unspecified: Secondary | ICD-10-CM | POA: Insufficient documentation

## 2018-08-31 DIAGNOSIS — M8580 Other specified disorders of bone density and structure, unspecified site: Secondary | ICD-10-CM | POA: Insufficient documentation

## 2018-08-31 DIAGNOSIS — M1712 Unilateral primary osteoarthritis, left knee: Secondary | ICD-10-CM | POA: Diagnosis not present

## 2018-08-31 DIAGNOSIS — Z8673 Personal history of transient ischemic attack (TIA), and cerebral infarction without residual deficits: Secondary | ICD-10-CM | POA: Insufficient documentation

## 2018-08-31 DIAGNOSIS — M79604 Pain in right leg: Secondary | ICD-10-CM | POA: Insufficient documentation

## 2018-08-31 DIAGNOSIS — Z9013 Acquired absence of bilateral breasts and nipples: Secondary | ICD-10-CM | POA: Insufficient documentation

## 2018-08-31 DIAGNOSIS — Z96651 Presence of right artificial knee joint: Secondary | ICD-10-CM | POA: Insufficient documentation

## 2018-08-31 DIAGNOSIS — Z9049 Acquired absence of other specified parts of digestive tract: Secondary | ICD-10-CM | POA: Insufficient documentation

## 2018-08-31 DIAGNOSIS — Z96643 Presence of artificial hip joint, bilateral: Secondary | ICD-10-CM | POA: Insufficient documentation

## 2018-08-31 MED ORDER — CYCLOBENZAPRINE HCL 5 MG PO TABS: ORAL_TABLET | ORAL | 2 refills | Status: DC

## 2018-08-31 MED ORDER — HYDROCODONE-ACETAMINOPHEN 7.5-325 MG PO TABS: 1.0000 | ORAL_TABLET | Freq: Four times a day (QID) | ORAL | 0 refills | Status: DC | PRN

## 2018-08-31 NOTE — Progress Notes (Signed)
Subjective:    Patient ID: Crystal Lee, female    DOB: 10/10/1933, 85 y.o.   MRN: [id number]  HPI: Crystal Lee is a 85 y.o. female her appointment was changed, due to national recommendations of social distancing due to [place] 19, an audio/video telehealth visit is felt to be most appropriate for this patient at this time.  See Chart message from today for the patient's consent to telehealth from [place].    She states her pain is located in her bilateral hands, lower back radiating into her lower extremities, bilateral hipa and also reports generalized pain all over. She rates her pain 5. Her current exercise regime is walking.   Crystal Lee Morphine equivalent is 30.00 MME. Last Oral Swab was Performed on 06/02/2018, it was consistent.   Crystal Lee asked the Health and History Questions.  I Crystal Lee verified that I am speaking with the correct person using two identifiers.  Pain Inventory Average Pain 5 Pain Right Now 5 My pain is constant, tingling and aching  In the last 24 hours, has pain interfered with the following? General activity 8 Relation with others 0 Enjoyment of life 0 What TIME of day is your pain at its worst? varies with activity Sleep (in general) Poor  Pain is worse with: walking and standing Pain improves with: medication Relief from Meds: 2  Mobility walk with assistance  Function retired  Neuro/Psych trouble walking  Prior Studies Any changes since last visit?  no  Physicians involved in your care Any changes since last visit?  no   Family History  Problem Relation Age of Onset  . Other Mother        complications from flu  . Other Father        unsure of cause  . Colon cancer Neg Hx    Social History   Socioeconomic History  . Marital status: Married    Spouse name: Not on file  . Number of children: 3  . Years of education: 16 years  . Highest education level: Not on file   Occupational History  . Occupation: Retired  Scientific laboratory technician  . Financial resource strain: Not on file  . Food insecurity:    Worry: Not on file    Inability: Not on file  . Transportation needs:    Medical: Not on file    Non-medical: Not on file  Tobacco Use  . Smoking status: Former Smoker    Last attempt to quit: 02/03/1976    Years since quitting: 42.6  . Smokeless tobacco: Never Used  Substance and Sexual Activity  . Alcohol use: No  . Drug use: No  . Sexual activity: Yes    Birth control/protection: Post-menopausal  Lifestyle  . Physical activity:    Days per week: Not on file    Minutes per session: Not on file  . Stress: Not on file  Relationships  . Social connections:    Talks on phone: Not on file    Gets together: Not on file    Attends religious service: Not on file    Active member of club or organization: Not on file    Attends meetings of clubs or organizations: Not on file    Relationship status: Not on file  Other Topics Concern  . Not on file  Social History Narrative   Lives at home with husband.   Right-handed.   No caffeine use.   Past Surgical History:  Procedure Laterality Date  . [place] STUDY N/A 06/21/2018   Procedure: [place] STUDY;  Surgeon: Crystal Bullion, DO;  [facility];  Service: Gastroenterology;  Laterality: N/A;  with impedance  . ABDOMINAL HYSTERECTOMY  2010  . APPENDECTOMY  1949  . [place]  . BREAST SURGERY    . CATARACT EXTRACTION Bilateral 2011  . CHOLECYSTECTOMY    . ESOPHAGEAL MANOMETRY N/A 06/21/2018   Procedure: ESOPHAGEAL MANOMETRY (EM);  Surgeon: Crystal Bullion, DO;  [facility];  Service: Gastroenterology;  Laterality: N/A;  . GALLBLADDER SURGERY  2015  . JOINT REPLACEMENT     RT TOTAL HIP / RT TOTAL KNEE  . MASTECTOMY  1998   BILATERAL   . [place] IMPEDANCE STUDY  06/21/2018   Procedure: [place] IMPEDANCE STUDY;  Surgeon: Crystal Bullion, DO;  [facility];  Service:  Gastroenterology;;  . SKIN CANCER EXCISION  2016   RT SIDE OF NOSE  . TONSILLECTOMY  1953  . TOTAL HIP ARTHROPLASTY  2010   RIGHT  . TOTAL HIP ARTHROPLASTY Left 05/09/2015   Procedure: LEFT TOTAL HIP ARTHROPLASTY ANTERIOR APPROACH;  Surgeon: Crystal Arabian, MD;  [facility];  Service: Orthopedics;  Laterality: Left;  . TOTAL KNEE ARTHROPLASTY  2001  . TUMOR REMOVAL  2012   ABDOMINAL - NON CANCEROUS   Past Medical History:  Diagnosis Date  . Arthritis   . Cancer (HCC)    HX BREAST CANCER/ SKIN CANCER  . Complication of anesthesia    N/V WITH MORPHINE  . Difficulty sleeping   . Fractured hip ([place])    LEFT - AUG 2016  . GERD (gastroesophageal reflux disease)   . Hyperlipidemia   . Hypertension   . Melanoma ([place])   . Neuropathy   . Nocturia   . Osteopenia   . 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 PONV (postoperative nausea and vomiting)    PT STATES MORPHINE CAUSED N/V  . Rosacea   . Sleep apnea   . Stage 1 breast cancer, ER+, right ([place]) 07/04/2017   There were no vitals taken for this visit.  Opioid Risk Score:   Fall Risk Score:  `1  Depression screen PHQ 2/9  Depression screen [facility] 2/9 06/02/2018 04/10/2018 02/20/2018 10/17/2017  Decreased Interest 0 0 0 0  Down, Depressed, Hopeless 0 0 0 0  PHQ - 2 Score 0 0 0 0  Altered sleeping - - - 3  Tired, decreased energy - - - 3  Change in appetite - - - 0  Feeling bad or failure about yourself  - - - 0  Trouble concentrating - - - 0  Moving slowly or fidgety/restless - - - 0  Suicidal thoughts - - - 0  PHQ-9 Score - - - 6  Difficult doing work/chores - - - Somewhat difficult    Review of Systems  Constitutional: Negative.   HENT: Negative.   Eyes: Negative.   Respiratory: Negative.   Cardiovascular: Negative.   Gastrointestinal: Negative.   Endocrine: Negative.   Genitourinary: Negative.   Musculoskeletal: Positive for back pain and gait problem.  Skin: Negative.   Allergic/Immunologic:  Negative.   Hematological: Negative.        Objective:   Physical Exam Vitals signs and nursing note reviewed.  Neurological:     Mental Status: She is oriented to person, place, and time.           Assessment & Plan:  1. Chronic Bilateral Leg pain: Paresthesia: Continue Lyrica. Continue to Monitor.08/31/2018 2. Lumbar Radiculitis: Continue Lyrica.08/31/2018 3. Pain of Left Wrist/: No complaints Today:S/P Carpal Tunnel Release on 06/03/2017 by Dr. Ellene Route. Dr. Ellene Route Following.08/31/2018. 4. Fracture of superior pubic ramus.Dr. AluisioFollowing.Continue to monitor. 08/31/2018. 3. Left Knee OA: Continue Voltaren Gel.Continue to monitor. Orthopedist following.08/31/2018. 4. Polyarthralgia: Continue to alternate with heat and ice therapy. Continue current medication regime. Continue to monitor.08/31/2018. 5. Chronic Pain Syndrome: Refilled:Hydrocodone7.5/325 mg one tablet every 6 hours as needed for moderate pain #120.08/31/2018.  Location of patient: Home Location of provider: Office  F/U in 1 month

## 2018-09-21 ENCOUNTER — Telehealth: Payer: Self-pay | Admitting: Family Medicine

## 2018-09-21 ENCOUNTER — Other Ambulatory Visit: Payer: Self-pay

## 2018-09-21 ENCOUNTER — Telehealth: Payer: Self-pay | Admitting: *Deleted

## 2018-09-21 MED ORDER — LOSARTAN POTASSIUM 25 MG PO TABS: 25.0000 mg | ORAL_TABLET | Freq: Every day | ORAL | 1 refills | Status: DC

## 2018-09-21 MED ORDER — ATORVASTATIN CALCIUM 20 MG PO TABS: 20.0000 mg | ORAL_TABLET | Freq: Every day | ORAL | 1 refills | Status: DC

## 2018-09-21 MED ORDER — PREGABALIN 100 MG PO CAPS: 100.0000 mg | ORAL_CAPSULE | Freq: Three times a day (TID) | ORAL | 1 refills | Status: DC

## 2018-09-21 NOTE — Telephone Encounter (Signed)
Copied from [place] [id number]. Topic: Quick Communication - Rx Refill/Question >> Sep 21, 2018 12:38 PM Leward Quan A wrote: Medication: losartan (COZAAR) 25 MG tablet, pregabalin (LYRICA) 100 MG capsule  Has the patient contacted their pharmacy? Yes.   (Agent: If no, request that the patient contact the pharmacy for the refill.) (Agent: If yes, when and what did the pharmacy advise?)  Preferred Pharmacy (with phone number or street name): [place], [place] [phone] (Phone) [phone] (Fax)    Agent: Please be advised that RX refills may take up to 3 business days. We ask that you follow-up with your pharmacy.

## 2018-09-21 NOTE — Telephone Encounter (Signed)
Refills have been sent, patient scheduled for Monday via telephone visit.

## 2018-09-21 NOTE — Telephone Encounter (Signed)
Patient calling to move her appointment up. She states she is having problems with flank pain related to her kidneys. I explained to the patient that this office treats her history of breast cancer and not anything related to her kidneys. When reviewing the last note by Dr Marin Olp, he recommends that the patient follow up with her PCP for a referral to urology due to renal cysts. Patient did not remember this, but agrees to call her PCP. She states she needs to see her anyway for medication refills. At this time will keep currently scheduled appointment with this office for 5/20 and patient will follow up with PCP.

## 2018-09-21 NOTE — Telephone Encounter (Signed)
Lets call her and set up a virtual or phone visit.  Maybe can do face time if she has an iphone?

## 2018-09-22 ENCOUNTER — Other Ambulatory Visit: Payer: Self-pay | Admitting: Family Medicine

## 2018-09-22 MED ORDER — PREGABALIN 100 MG PO CAPS: 100.0000 mg | ORAL_CAPSULE | Freq: Three times a day (TID) | ORAL | 1 refills | Status: DC

## 2018-09-24 NOTE — Progress Notes (Signed)
[place] at [facility] [address] [place], [place] [address] [phone]  Date:  09/25/2018   Name:  Crystal Lee   DOB:  1933-06-27   MRN:  [id number]  PCP:  Darreld Mclean, MD    Chief Complaint: No chief complaint on file.   History of Present Illness:  Crystal Lee is a 85 y.o. very pleasant female patient who presents with the following:  Telephone visit today for concern of back pain- I last saw this pt in September of 2019 to establish care Pt location is home Provider location is office Pt ID confirmed with name and DOB, consent given for virtual visit today   Moved from [place] a few years ago  History of breast cancer s/p bilateral mastectomy, HTN, hyperlipidemia, osteoporosis  Breast cancer dx 1998- she follows up with oncology and there has been no evidence of recurrence although she has had some LAD followed on serial CT scans Most recent CT 07/2018: IMPRESSION: 1. Sigmoid diverticula. Evaluation limited by streak artifact from hip replacements. No obvious changes of diverticulitis. 2. Narrowing of portions of the sigmoid colon and rectosigmoid region. This may be related to under distension however can not exclude underlying mass. 3. Similar appearance of upper abdominal and lower mediastinal adenopathy of indeterminate etiology. 4. Subtle changes of cirrhosis may be present as previously questioned. Appearance unchanged. Stable 3 mm hypodensity left lobe liver. 5.  Aortic Atherosclerosis (ICD10-I70.0). 6. New from 03/01/2017 MR is an L2 inferior endplate compression fracture with 30% loss of height. Superimposed prominent L2 inferior endplate Schmorl's node deformity. Also new from prior MR is a small Schmorl's node deformity superior endplate L4. Prominent degenerative changes L2-3 through L5-S1. Prior hip replacement bilaterally.  Takes lyrica and hydrocodone for peripheral neuropathy  Sees pain  management - Dr. Oval Linsey is the managing physician, Danella Sensing NP is her usual prescriber   Her husband died on Aug 11, 2022- this loss has been really hard on her   She notes that she is continuing to have pain in her back over the last year or so.  I reviewed pain management note from 3/2 - they did discuss her back pain a bit, she was noted to have lower back tenderness to palpation.  Thought to have post- laminectomy syndrome She has not been sick with a cough or fever   Patient Active Problem List   Diagnosis Date Noted  . Lumbar post-laminectomy syndrome 07/31/2018  . Heartburn   . Osteopenia 04/15/2018  . Dyslipidemia 02/02/2018  . Gastroesophageal reflux disease 02/02/2018  . Primary osteoarthritis of left knee 10/17/2017  . History of right knee joint replacement 10/17/2017  . Pain in both lower extremities 08/02/2017  . Stage 1 breast cancer, ER+, right ([place]) 07/04/2017  . 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 Paresthesia 02/09/2017  . Low back pain 02/09/2017  . Gait abnormality 02/09/2017  . OA (osteoarthritis) of hip 05/09/2015    Past Medical History:  Diagnosis Date  . Arthritis   . Cancer (HCC)    HX BREAST CANCER/ SKIN CANCER  . Complication of anesthesia    N/V WITH MORPHINE  . Difficulty sleeping   . Fractured hip ([place])    LEFT - AUG 2016  . GERD (gastroesophageal reflux disease)   . Hyperlipidemia   . Hypertension   . Melanoma ([place])   . Neuropathy   . Nocturia   . Osteopenia   . Osteoporosis due  to aromatase inhibitor 07/04/2017  . PONV (postoperative nausea and vomiting)    PT STATES MORPHINE CAUSED N/V  . Rosacea   . Sleep apnea   . Stage 1 breast cancer, ER+, right ([place]) 07/04/2017    Past Surgical History:  Procedure Laterality Date  . [place] STUDY N/A 06/21/2018   Procedure: [place] STUDY;  Surgeon: Lavena Bullion, DO;  Location: [facility];  Service: Gastroenterology;  Laterality: N/A;  with impedance  .  ABDOMINAL HYSTERECTOMY  2010  . APPENDECTOMY  1949  . [place]  . BREAST SURGERY    . CATARACT EXTRACTION Bilateral 2011  . CHOLECYSTECTOMY    . ESOPHAGEAL MANOMETRY N/A 06/21/2018   Procedure: ESOPHAGEAL MANOMETRY (EM);  Surgeon: Lavena Bullion, DO;  Location: [facility];  Service: Gastroenterology;  Laterality: N/A;  . GALLBLADDER SURGERY  2015  . JOINT REPLACEMENT     RT TOTAL HIP / RT TOTAL KNEE  . MASTECTOMY  1998   BILATERAL   . [place] IMPEDANCE STUDY  06/21/2018   Procedure: [place] IMPEDANCE STUDY;  Surgeon: Lavena Bullion, DO;  Location: [facility];  Service: Gastroenterology;;  . SKIN CANCER EXCISION  2016   RT SIDE OF NOSE  . TONSILLECTOMY  1953  . TOTAL HIP ARTHROPLASTY  2010   RIGHT  . TOTAL HIP ARTHROPLASTY Left 05/09/2015   Procedure: LEFT TOTAL HIP ARTHROPLASTY ANTERIOR APPROACH;  Surgeon: Gaynelle Arabian, MD;  Location: [facility];  Service: Orthopedics;  Laterality: Left;  . TOTAL KNEE ARTHROPLASTY  2001  . TUMOR REMOVAL  2012   ABDOMINAL - NON CANCEROUS    Social History   Tobacco Use  . Smoking status: Former Smoker    Last attempt to quit: 02/03/1976    Years since quitting: 42.6  . Smokeless tobacco: Never Used  Substance Use Topics  . Alcohol use: No  . Drug use: No    Family History  Problem Relation Age of Onset  . Other Mother        complications from flu  . Other Father        unsure of cause  . Colon cancer Neg Hx     Allergies  Allergen Reactions  . Morphine And Related Nausea And Vomiting    Medication list has been reviewed and updated.  Current Outpatient Medications on File Prior to Visit  Medication Sig Dispense Refill  . Alum & Mag Hydroxide-Simeth (GI COCKTAIL) SUSP suspension Take 10 mLs by mouth 2 (two) times daily as needed for indigestion. Shake well. 900 mL 2  . AMBULATORY NON FORMULARY MEDICATION Take 10 mLs by mouth every 4 (four) hours as needed. Medication Name: Viscous Lidocaine, Bentyl, Maalox (equal  parts) Take 44mL by mouth everey 4 hours as needed. 120 mL 3  . atorvastatin (LIPITOR) 20 MG tablet Take 1 tablet (20 mg total) by mouth daily. 90 tablet 1  . CALCIUM PO Take 1,200 mg by mouth daily.    . Cholecalciferol (VITAMIN D) 2000 units tablet Take 2,000 Units by mouth.    . cyclobenzaprine (FLEXERIL) 5 MG tablet 0.5  Tablet at bedtime as needed for muscle spasm 20 tablet 2  . diclofenac sodium (VOLTAREN) 1 % GEL Apply 1 application topically 3 (three) times daily. To feet, ankles, and knees. 3 Tube 4  . ergocalciferol (VITAMIN D2) 1.25 MG (50000 UT) capsule Take 1 capsule (50,000 Units total) by mouth once a week. 12 capsule 6  . HYDROcodone-acetaminophen (NORCO) 7.5-325 MG  tablet Take 1 tablet by mouth every 6 (six) hours as needed for moderate pain. 120 tablet 0  . LINZESS 290 MCG CAPS capsule Take 1 capsule by mouth daily.    Marland Kitchen losartan (COZAAR) 25 MG tablet Take 1 tablet (25 mg total) by mouth daily. 90 tablet 1  . ondansetron (ZOFRAN-ODT) 4 MG disintegrating tablet as needed.    . pregabalin (LYRICA) 100 MG capsule Take 1 capsule (100 mg total) by mouth 3 (three) times daily. 270 capsule 1  . tobramycin-dexamethasone (TOBRADEX) ophthalmic solution tobramycin 0.3 %-dexamethasone 0.1 % eye drops,suspension  INSTILL 1 DROP INTO RIGHT EYE TWICE DAILY FOR 2 WEEKS    . traZODone (DESYREL) 50 MG tablet Take 0.5-1 tablets (25-50 mg total) by mouth at bedtime. 30 tablet 2   No current facility-administered medications on file prior to visit.     Review of Systems:  As per HPI- otherwise negative.   Physical Examination: There were no vitals filed for this visit. There were no vitals filed for this visit. There is no height or weight on file to calculate BMI. Ideal Body Weight:    Spoke with pt- sounds her normal self  No cough, wheeze or distress is noted   Assessment and Plan: Lumbar post-laminectomy syndrome  Chronic low back pain, unspecified back pain laterality,  unspecified whether sciatica present  Spoke with pt for 8:30 today She is having chronic lower back pain Admits that she worries about breast cancer spreading to her bones She is not sick, and is willing to come in the office We planned to see her in person this coming Wednesday to further evaluate this issue   Signed Lamar Blinks, MD

## 2018-09-25 ENCOUNTER — Other Ambulatory Visit: Payer: Self-pay

## 2018-09-25 ENCOUNTER — Ambulatory Visit (INDEPENDENT_AMBULATORY_CARE_PROVIDER_SITE_OTHER): Payer: Medicare Other | Admitting: Family Medicine

## 2018-09-25 ENCOUNTER — Encounter: Payer: Self-pay | Admitting: Family Medicine

## 2018-09-25 DIAGNOSIS — M545 Low back pain, unspecified: Secondary | ICD-10-CM

## 2018-09-25 DIAGNOSIS — G8929 Other chronic pain: Secondary | ICD-10-CM | POA: Diagnosis not present

## 2018-09-25 DIAGNOSIS — M961 Postlaminectomy syndrome, not elsewhere classified: Secondary | ICD-10-CM | POA: Diagnosis not present

## 2018-09-26 NOTE — Progress Notes (Addendum)
[place] at [facility] [address] [place], [place] [address] [phone]  Date:  09/27/2018   Name:  Crystal Lee   DOB:  Nov 16, 1933   MRN:  [id number]  PCP:  Darreld Mclean, MD    Chief Complaint: Back Pain   History of Present Illness:  Crystal Lee is a 85 y.o. very pleasant female patient who presents with the following:  In person visit today for concern of back pain.  I spoke with her 2 days ago, and we planned to have her come in due to her concerns I last saw her in the office in September of last year, to establish care History of breast cancer status post bilateral mastectomy, osteoporosis, hypertension, hyperlipidemia. She is seeing Dr. Marin Olp for breast cancer and also lymphadenopathy noted on CT of her chest.  She has peripheral neuropathy, uses Lyrica and hydrocodone for this issue per pain management  She was seen at pain management on April 2 She was noted to have pain in her legs, which is thought to be primarily neuropathic but also due to end-stage knee arthritis She has lumbar postlaminectomy syndrome She notes that she saw her neurosurgeon, Dr. Ellene Route, last year.  I reviewed his note-however at that time they were really interested in a cervical issue.  They did not spend time on her lumbar spine  She also recently lost her husband-he died on 07/22/22 They had been married nearly 51 years Obviously his loss has been really tough on her  CT scan from February mentions a new since 2018 compression fracture at L2 and severe degenerative changes in the lumbar spine.  She also has several mentions of Schmorl's nodes  She notes pain in her back from the waist down-this is midline and also worse on the right She has had long term back pain She had back surgery (laminectomy we think) in 1973- did well following her operation for a while, then 20 years or so again it got worse again The pain is not  running down her legs Her legs do not feel weak or numb- but they do burn and tingle  No change in her bowel or bladder function She does tend to be constipated and to have frequent UTI   She would like referral to a urologist for her frequent UTI.  She would also like to consult with a cardiologist regarding her blood pressure management    Patient Active Problem List   Diagnosis Date Noted  . Lumbar post-laminectomy syndrome 07/31/2018  . Heartburn   . Osteopenia 04/15/2018  . Dyslipidemia 02/02/2018  . Gastroesophageal reflux disease 02/02/2018  . Primary osteoarthritis of left knee 10/17/2017  . History of right knee joint replacement 10/17/2017  . Pain in both lower extremities 08/02/2017  . Stage 1 breast cancer, ER+, right ([place]) 07/04/2017  . 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 Paresthesia 02/09/2017  . Low back pain 02/09/2017  . Gait abnormality 02/09/2017  . OA (osteoarthritis) of hip 05/09/2015    Past Medical History:  Diagnosis Date  . Arthritis   . Cancer (HCC)    HX BREAST CANCER/ SKIN CANCER  . Complication of anesthesia    N/V WITH MORPHINE  . Difficulty sleeping   . Fractured hip ([place])    LEFT - AUG 2016  . GERD (gastroesophageal reflux disease)   . Hyperlipidemia   . Hypertension   . Melanoma ([place])   . Neuropathy   .  Nocturia   . Osteopenia   . 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 PONV (postoperative nausea and vomiting)    PT STATES MORPHINE CAUSED N/V  . Rosacea   . Sleep apnea   . Stage 1 breast cancer, ER+, right ([place]) 07/04/2017    Past Surgical History:  Procedure Laterality Date  . [place] STUDY N/A 06/21/2018   Procedure: [place] STUDY;  Surgeon: Lavena Bullion, DO;  [facility];  Service: Gastroenterology;  Laterality: N/A;  with impedance  . ABDOMINAL HYSTERECTOMY  2010  . APPENDECTOMY  1949  . [place]  . BREAST SURGERY    . CATARACT EXTRACTION Bilateral 2011  .  CHOLECYSTECTOMY    . ESOPHAGEAL MANOMETRY N/A 06/21/2018   Procedure: ESOPHAGEAL MANOMETRY (EM);  Surgeon: Lavena Bullion, DO;  [facility];  Service: Gastroenterology;  Laterality: N/A;  . GALLBLADDER SURGERY  2015  . JOINT REPLACEMENT     RT TOTAL HIP / RT TOTAL KNEE  . MASTECTOMY  1998   BILATERAL   . [place] IMPEDANCE STUDY  06/21/2018   Procedure: [place] IMPEDANCE STUDY;  Surgeon: Lavena Bullion, DO;  [facility];  Service: Gastroenterology;;  . SKIN CANCER EXCISION  2016   RT SIDE OF NOSE  . TONSILLECTOMY  1953  . TOTAL HIP ARTHROPLASTY  2010   RIGHT  . TOTAL HIP ARTHROPLASTY Left 05/09/2015   Procedure: LEFT TOTAL HIP ARTHROPLASTY ANTERIOR APPROACH;  Surgeon: Gaynelle Arabian, MD;  Location: [facility];  Service: Orthopedics;  Laterality: Left;  . TOTAL KNEE ARTHROPLASTY  2001  . TUMOR REMOVAL  2012   ABDOMINAL - NON CANCEROUS    Social History   Tobacco Use  . Smoking status: Former Smoker    Last attempt to quit: 02/03/1976    Years since quitting: 42.6  . Smokeless tobacco: Never Used  Substance Use Topics  . Alcohol use: No  . Drug use: No    Family History  Problem Relation Age of Onset  . Other Mother        complications from flu  . Other Father        unsure of cause  . Colon cancer Neg Hx     Allergies  Allergen Reactions  . Morphine And Related Nausea And Vomiting    Medication list has been reviewed and updated.  Current Outpatient Medications on File Prior to Visit  Medication Sig Dispense Refill  . Alum & Mag Hydroxide-Simeth (GI COCKTAIL) SUSP suspension Take 10 mLs by mouth 2 (two) times daily as needed for indigestion. Shake well. 900 mL 2  . AMBULATORY NON FORMULARY MEDICATION Take 10 mLs by mouth every 4 (four) hours as needed. Medication Name: Viscous Lidocaine, Bentyl, Maalox (equal parts) Take 70mL by mouth everey 4 hours as needed. 120 mL 3  . atorvastatin (LIPITOR) 20 MG tablet Take 1 tablet (20 mg total) by mouth daily. 90  tablet 1  . CALCIUM PO Take 1,200 mg by mouth daily.    . Cholecalciferol (VITAMIN D) 2000 units tablet Take 2,000 Units by mouth.    . cyclobenzaprine (FLEXERIL) 5 MG tablet 0.5  Tablet at bedtime as needed for muscle spasm 20 tablet 2  . diclofenac sodium (VOLTAREN) 1 % GEL Apply 1 application topically 3 (three) times daily. To feet, ankles, and knees. 3 Tube 4  . ergocalciferol (VITAMIN D2) 1.25 MG (50000 UT) capsule Take 1 capsule (50,000 Units total) by mouth once a  week. 12 capsule 6  . HYDROcodone-acetaminophen (NORCO) 7.5-325 MG tablet Take 1 tablet by mouth every 6 (six) hours as needed for moderate pain. 120 tablet 0  . LINZESS 290 MCG CAPS capsule Take 1 capsule by mouth daily.    Marland Kitchen losartan (COZAAR) 25 MG tablet Take 1 tablet (25 mg total) by mouth daily. 90 tablet 1  . ondansetron (ZOFRAN-ODT) 4 MG disintegrating tablet as needed.    . pregabalin (LYRICA) 100 MG capsule Take 1 capsule (100 mg total) by mouth 3 (three) times daily. 270 capsule 1  . tobramycin-dexamethasone (TOBRADEX) ophthalmic solution tobramycin 0.3 %-dexamethasone 0.1 % eye drops,suspension  INSTILL 1 DROP INTO RIGHT EYE TWICE DAILY FOR 2 WEEKS    . traZODone (DESYREL) 50 MG tablet Take 0.5-1 tablets (25-50 mg total) by mouth at bedtime. 30 tablet 2   No current facility-administered medications on file prior to visit.     Review of Systems:  As per HPI- otherwise negative. No fever or chills, no chest pain or shortness of breath  Physical Examination: Vitals:   09/27/18 1310  BP: 128/74  Pulse: 68  Resp: 18  Temp: 97.7 F (36.5 C)  SpO2: 97%   Vitals:   09/27/18 1310  Weight: 232 lb (105.2 kg)  Height: 5\' 6"  (1.676 m)   Body mass index is 37.45 kg/m. Ideal Body Weight: Weight in (lb) to have BMI = 25: 154.6  GEN: WDWN, NAD, Non-toxic, A & O x 3, obese, looks well HEENT: Atraumatic, Normocephalic. Neck supple. No masses, No LAD. Ears and Nose: No external deformity. CV: RRR, No M/G/R. No  JVD. No thrill. No extra heart sounds. PULM: CTA B, no wheezes, crackles, rhonchi. No retractions. No resp. distress. No accessory muscle use. EXTR: No c/c/e NEURO normal gait for patient-slow, uses a cane PSYCH: Normally interactive. Conversant. Not depressed or anxious appearing.  Calm demeanor.  She notes nonspecific, diffuse tenderness throughout her lumbar back.  No point bony tenderness is appreciated.  Did not test lumbar flexion or extension due to concerns about patient balance.  While seated her bilateral leg strength is normal and equal, normal patellar DTR  Assessment and Plan: Chronic midline low back pain without sciatica - Plan: DG Lumbar Spine Complete  Closed compression fracture of L2 lumbar vertebra with delayed healing, subsequent encounter - Plan: DG Lumbar Spine Complete  Frequent UTI - Plan: Urine Culture, Ambulatory referral to Urology, CANCELED: Urine Culture  Essential hypertension - Plan: Ambulatory referral to Cardiology  Concern about worsening back pain.  She was noted to have a ?new L2 compression fracture on last CT scan.  I will get plain films of her back today, and will contact her pain management team about her care  She is going to bring in a urine sample for culture later- she is not able to urinate away from home per her report  Placed referral to urology and cardiology per her request  Signed Lamar Blinks, MD   I spoke with Stevenson Clinch, her NP at [facility] today about her back pain Let her know that I am getting x-ray of her spine to check on her lumbar compression fracture  They actually have a visit tomorrow already planned and will discuss her pain control at that time   Addendum 5/4-received her preliminary urine culture    MICRO NUMBER: [id number] P  SPECIMEN QUALITY: Adequate P  Sample Source URINE P  STATUS: PRELIMINARY P  ISOLATE 1: Pseudomonas aeruginosaAbnormal  P  Comment: Greater than 100,000  CFU/mL of Pseudomonas aeruginosa Isolate  failed to grow on susceptibility, test being repeated.    It does show Pseudomonas, although it failed to grow on preliminary sensitivity.  Culture is being repeated  We will start antibiotics- 3rd generation ceph, omnicef rx 300 BID for one week  Called to let her know but no answer, VM is not set up   addnd 5/5- urine culture did not grow on sensitivity profile Called and was able to connect She picked up abx Unusual urine culture result She will bring in a repeat culture in about 10 days to make sure cleared up   Dg Lumbar Spine Complete  Result Date: 09/27/2018 CLINICAL DATA:  Midline low back pain. Closed compression fracture L2 EXAM: LUMBAR SPINE - COMPLETE 4+ VIEW COMPARISON:  Lumbar MRI 03/01/2017, CT abdomen pelvis 07/17/2018 FINDINGS: Osteopenia. Mild dextroscoliosis. Normal alignment with straightening of the lumbar lordosis Compression fracture inferior endplate of L2 was not present in 2018 but is unchanged from the prior CT. This has a sclerotic margin and appears to be a healed fracture. Mild compression fracture superior endplate of L3 was not present on either prior study and has occurred since the CT of 07/17/2018. No other fracture. Disc degeneration and spurring L1-2, L2-3, L3-4, L4-5, and L5-S1. IMPRESSION: Mild fracture inferior endplate of L2 unchanged from the prior CT. Mild compression fracture superior endplate of L3 has occurred since the prior CT of 07/17/2018. Scoliosis and lumbar degenerative change. Electronically Signed   By: Franchot Gallo M.D.   On: 09/27/2018 14:25   Received her spine films as above-stable compression fracture at L2, but she now has a new compression fracture at L3 as well Call patient but no answer.  Will send her a copy of her x-rays with notes in the mail  I will send a message to Ms. Marcello Moores about her x-ray findings

## 2018-09-27 ENCOUNTER — Telehealth: Payer: Self-pay | Admitting: Registered Nurse

## 2018-09-27 ENCOUNTER — Encounter: Payer: Self-pay | Admitting: Family Medicine

## 2018-09-27 ENCOUNTER — Ambulatory Visit (INDEPENDENT_AMBULATORY_CARE_PROVIDER_SITE_OTHER): Payer: Medicare Other | Admitting: Family Medicine

## 2018-09-27 ENCOUNTER — Other Ambulatory Visit: Payer: Self-pay

## 2018-09-27 ENCOUNTER — Ambulatory Visit (HOSPITAL_BASED_OUTPATIENT_CLINIC_OR_DEPARTMENT_OTHER)
Admission: RE | Admit: 2018-09-27 | Discharge: 2018-09-27 | Disposition: A | Discharge status: Home / Self Care | Payer: Medicare Other | Source: Ambulatory Visit | Attending: Family Medicine | Admitting: Family Medicine

## 2018-09-27 VITALS — BP 128/74 | HR 68 | Temp 97.7°F | Resp 18 | Ht 66.0 in | Wt 232.0 lb

## 2018-09-27 DIAGNOSIS — I10 Essential (primary) hypertension: Secondary | ICD-10-CM

## 2018-09-27 DIAGNOSIS — S32020G Wedge compression fracture of second lumbar vertebra, subsequent encounter for fracture with delayed healing: Secondary | ICD-10-CM | POA: Diagnosis not present

## 2018-09-27 DIAGNOSIS — N390 Urinary tract infection, site not specified: Secondary | ICD-10-CM | POA: Diagnosis not present

## 2018-09-27 DIAGNOSIS — M545 Low back pain, unspecified: Secondary | ICD-10-CM

## 2018-09-27 DIAGNOSIS — G8929 Other chronic pain: Secondary | ICD-10-CM

## 2018-09-27 NOTE — Patient Instructions (Addendum)
You have a compression type fracture in your lumbar back.  This may be why your pain has been more intense. Please go to the ground floor, so we can do plain x-rays of your back.  I am waiting on a return call from your pain management doctor.  However, I think you actually have a phone visit with them tomorrow.  You pain regimen may need to be altered while your fracture heals

## 2018-09-27 NOTE — Telephone Encounter (Signed)
Return Dr. Lorelei Pont call, she states she seen  Crystal Lee today. Crystal Lee was complaining of increase intensity of lower back pain, she reviewed  CT Scan from 07/2018 it revealed : A new  Lumbar Compression fracture 30%. Today she order X-ray. Crystal Lee is scheduled for a telephone visit on 09/28/2018,  With this provider we will adjust her medication this month and evaluate monthly. She verbalizes understanding.

## 2018-09-28 ENCOUNTER — Encounter: Payer: Self-pay | Admitting: Registered Nurse

## 2018-09-28 ENCOUNTER — Encounter (HOSPITAL_BASED_OUTPATIENT_CLINIC_OR_DEPARTMENT_OTHER): Payer: Medicare Other | Admitting: Registered Nurse

## 2018-09-28 ENCOUNTER — Other Ambulatory Visit: Payer: Medicare Other

## 2018-09-28 VITALS — BP 128/74 | HR 68 | Ht 66.0 in | Wt 232.0 lb

## 2018-09-28 DIAGNOSIS — M7062 Trochanteric bursitis, left hip: Secondary | ICD-10-CM

## 2018-09-28 DIAGNOSIS — M2550 Pain in unspecified joint: Secondary | ICD-10-CM | POA: Diagnosis not present

## 2018-09-28 DIAGNOSIS — Z79899 Other long term (current) drug therapy: Secondary | ICD-10-CM

## 2018-09-28 DIAGNOSIS — M7061 Trochanteric bursitis, right hip: Secondary | ICD-10-CM

## 2018-09-28 DIAGNOSIS — M5416 Radiculopathy, lumbar region: Secondary | ICD-10-CM | POA: Diagnosis not present

## 2018-09-28 DIAGNOSIS — M1712 Unilateral primary osteoarthritis, left knee: Secondary | ICD-10-CM

## 2018-09-28 DIAGNOSIS — G894 Chronic pain syndrome: Secondary | ICD-10-CM | POA: Diagnosis not present

## 2018-09-28 DIAGNOSIS — M62838 Other muscle spasm: Secondary | ICD-10-CM | POA: Diagnosis not present

## 2018-09-28 DIAGNOSIS — S32000S Wedge compression fracture of unspecified lumbar vertebra, sequela: Secondary | ICD-10-CM

## 2018-09-28 DIAGNOSIS — Z5181 Encounter for therapeutic drug level monitoring: Secondary | ICD-10-CM

## 2018-09-28 DIAGNOSIS — M961 Postlaminectomy syndrome, not elsewhere classified: Secondary | ICD-10-CM | POA: Diagnosis not present

## 2018-09-28 DIAGNOSIS — R202 Paresthesia of skin: Secondary | ICD-10-CM | POA: Diagnosis not present

## 2018-09-28 DIAGNOSIS — N390 Urinary tract infection, site not specified: Secondary | ICD-10-CM

## 2018-09-28 MED ORDER — HYDROCODONE-ACETAMINOPHEN 10-325 MG PO TABS: 1.0000 | ORAL_TABLET | Freq: Four times a day (QID) | ORAL | 0 refills | Status: DC | PRN

## 2018-09-28 NOTE — Progress Notes (Signed)
Subjective:    Patient ID: Crystal Lee, female    DOB: [birth year], 85 y.o.   MRN: [id number]  HPI: Crystal Lee is a 85 y.o. female her appointment was changed, due to national recommendations of social distancing due to [place] 19, an audio/video telehealth visit is felt to be most appropriate for this patient at this time.  See Chart message from today for the patient's consent to telehealth from [place].     She states her pain is located in her mid- lower back, also reports her  lower back pain  Is radiating upward into her  Mid bacc (thoracic) mainly left side. Crystal Lee was seen by Dr. Lorelei Pont yesterday, Dr. Lorelei Pont called this provider regarding her compression fracture. Dr. Lorelei Pont ordered lumbar X-ray, results as follows  LUMBAR SPINE - COMPLETE 4+ VIEW  COMPARISON:  Lumbar MRI 03/01/2017, CT abdomen pelvis 07/17/2018  FINDINGS: Osteopenia. Mild dextroscoliosis. Normal alignment with straightening of the lumbar lordosis  Compression fracture inferior endplate of L2 was not present in 2018 but is unchanged from the prior CT. This has a sclerotic margin and appears to be a healed fracture.  Mild compression fracture superior endplate of L3 was not present on either prior study and has occurred since the CT of 07/17/2018. No other fracture.  Disc degeneration and spurring L1-2, L2-3, L3-4, L4-5, and L5-S1.  IMPRESSION: Mild fracture inferior endplate of L2 unchanged from the prior CT.  Mild compression fracture superior endplate of L3 has occurred since the prior CT of 07/17/2018.  Scoliosis and lumbar degenerative change.  We will increase Crystal Lee Hydrocodone to 10/325 mg one tablet every 6 hours #120, she verbalizes understanding. . Crystal Lee was instructed to rest and alternate with Heat and Ice Therapy, she prefers heat she states. Also had a back brace in the past she will try to find her back brace, she was  instructed to call office in two weeks to evaluate her pain and  medication management, she verbalizes understanding.   She rates her pain 7. Her current exercise regime is walking and performing stretching exercises.  Crystal Lee Morphine equivalent is 30.00 MME.  Last Oral Swab was Performed on 06/02/2018, it was consistent .   Crystal Lee CMA asked the Health and History Questions. This provider and Kennon Rounds verified that I am speaking with the correct person using two identifiers.   Pain Inventory Average Pain 8 Pain Right Now 7 My pain is intermittent, burning, tingling and aching  In the last 24 hours, has pain interfered with the following? General activity 7 Relation with others 7 Enjoyment of life 7 What TIME of day is your pain at its worst? varies Sleep (in general) Poor  Pain is worse with: walking, bending, standing and some activites Pain improves with: rest and medication Relief from Meds: 5  Mobility use a cane use a walker how many minutes can you walk? 5 ability to climb steps?  no do you drive?  yes  Function disabled: date disabled na I need assistance with the following:  meal prep, household duties and shopping  Neuro/Psych weakness tingling trouble walking  Prior Studies Any changes since last visit?  no x-rays  Physicians involved in your care Primary care Dr. Janett Billow Copland   Family History  Problem Relation Age of Onset   Other Mother        complications from flu   Other Father        unsure  of cause   Colon cancer Neg Hx    Social History   Socioeconomic History   Marital status: Married    Spouse name: Not on file   Number of children: 3   Years of education: 16 years   Highest education level: Not on file  Occupational History   Occupation: Retired  Scientist, product/process development strain: Not on file   Food insecurity:    Worry: Not on file    Inability: Not on file   Transportation needs:     Medical: Not on file    Non-medical: Not on file  Tobacco Use   Smoking status: Former Smoker    Last attempt to quit: 02/03/1976    Years since quitting: 42.6   Smokeless tobacco: Never Used  Substance and Sexual Activity   Alcohol use: No   Drug use: No   Sexual activity: Yes    Birth control/protection: Post-menopausal  Lifestyle   Physical activity:    Days per week: Not on file    Minutes per session: Not on file   Stress: Not on file  Relationships   Social connections:    Talks on phone: Not on file    Gets together: Not on file    Attends religious service: Not on file    Active member of club or organization: Not on file    Attends meetings of clubs or organizations: Not on file    Relationship status: Not on file  Other Topics Concern   Not on file  Social History Narrative   Lives at home with husband.   Right-handed.   No caffeine use.   Past Surgical History:  Procedure Laterality Date   75 HOUR [place] STUDY N/A 06/21/2018   Procedure: 24 HOUR [place] STUDY;  Surgeon: Lavena Bullion, DO;  [facility]: [facility];  Service: Gastroenterology;  Laterality: N/A;  with impedance   ABDOMINAL HYSTERECTOMY  2010   APPENDECTOMY  1949   BACK SURGERY  1973   BREAST SURGERY     CATARACT EXTRACTION Bilateral 2011   CHOLECYSTECTOMY     ESOPHAGEAL MANOMETRY N/A 06/21/2018   Procedure: ESOPHAGEAL MANOMETRY (EM);  Surgeon: Lavena Bullion, DO;  [facility];  Service: Gastroenterology;  Laterality: N/A;   GALLBLADDER SURGERY  2015   JOINT REPLACEMENT     RT TOTAL HIP / RT TOTAL KNEE   MASTECTOMY  1998   BILATERAL    [place] IMPEDANCE STUDY  06/21/2018   Procedure: [place] IMPEDANCE STUDY;  Surgeon: Lavena Bullion, DO;  [facility]: [facility];  Service: Gastroenterology;;   SKIN CANCER EXCISION  2016   RT SIDE OF NOSE   TONSILLECTOMY  1953   TOTAL HIP ARTHROPLASTY  2010   RIGHT   TOTAL HIP ARTHROPLASTY Left 05/09/2015   Procedure: LEFT TOTAL HIP  ARTHROPLASTY ANTERIOR APPROACH;  Surgeon: Gaynelle Arabian, MD;  [facility]: [facility];  Service: Orthopedics;  Laterality: Left;   TOTAL KNEE ARTHROPLASTY  2001   TUMOR REMOVAL  2012   ABDOMINAL - NON CANCEROUS   Past Medical History:  Diagnosis Date   Arthritis    Cancer ([place])    HX BREAST CANCER/ SKIN CANCER   Complication of anesthesia    N/V WITH MORPHINE   Difficulty sleeping    Fractured hip ([place])    LEFT - AUG 2016   GERD (gastroesophageal reflux disease)    Hyperlipidemia    Hypertension    Melanoma ([place])    Neuropathy  Nocturia    Osteopenia    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   PONV (postoperative nausea and vomiting)    PT STATES MORPHINE CAUSED N/V   Rosacea    Sleep apnea    Stage 1 breast cancer, ER+, right ([place]) 07/04/2017   BP 128/74 Comment: pt reported, virtual visit   Pulse 68 Comment: pt reported, virtual visit   Ht 5\' 6"  (1.676 m) Comment: pt reported, virtual visit   Wt 232 lb (105.2 kg) Comment: pt reported, virtual visit   BMI 37.45 kg/m   Opioid Risk Score:   Fall Risk Score:  `1  Depression screen PHQ 2/9  Depression screen [facility] 2/9 09/28/2018 06/02/2018 04/10/2018 02/20/2018 10/17/2017  Decreased Interest 0 0 0 0 0  Down, Depressed, Hopeless 0 0 0 0 0  PHQ - 2 Score 0 0 0 0 0  Altered sleeping - - - - 3  Tired, decreased energy - - - - 3  Change in appetite - - - - 0  Feeling bad or failure about yourself  - - - - 0  Trouble concentrating - - - - 0  Moving slowly or fidgety/restless - - - - 0  Suicidal thoughts - - - - 0  PHQ-9 Score - - - - 6  Difficult doing work/chores - - - - Somewhat difficult    Review of Systems  Constitutional: Negative.   HENT: Negative.   Eyes: Negative.   Respiratory: Negative.   Cardiovascular: Negative.   Gastrointestinal: Positive for constipation.  Endocrine: Negative.   Genitourinary: Negative.   Musculoskeletal: Positive for arthralgias and back pain.       Leg pain Foot  pain  Skin: Negative.   Allergic/Immunologic: Negative.   Neurological: Positive for weakness.  Hematological: Negative.   Psychiatric/Behavioral: Negative.   All other systems reviewed and are negative.      Objective:   Physical Exam Vitals signs and nursing note reviewed.  Musculoskeletal:     Comments: No Physical Exam: Virtual Visit  Neurological:     Mental Status: She is oriented to person, place, and time.           Assessment & Plan:  1. Chronic Bilateral Leg pain: Paresthesia: Continue Lyrica. Continue to Monitor.09/28/2018 2. Lumbar Radiculitis: Continue Lyrica.09/28/2018 3. Pain of Left Wrist/: No complaints Today:S/P Carpal Tunnel Release on 06/03/2017 by Dr. Ellene Route. Dr. Ellene Route Following.09/28/2018. 4. Fracture of superior pubic ramus.Dr. AluisioFollowing.Continue to monitor. 09/28/2018. 3. Left Knee OA: Continue Voltaren Gel.Continue to monitor. Orthopedist following.09/01/2018. 4. Polyarthralgia: Continue to alternate with heat and ice therapy. Continue current medication regime. Continue to monitor.09/28/2018. 5. Chronic Pain Syndrome: Refilled:Increase:Hydrocodone10/325 mg one tablet every 6 hours as needed for moderate pain #120.09/28/2018. 6. Lumbar Compression Fracture L2 and L3: PCP ordered X-rays. Continue with rest/ heat therapy, also instructed to wear her back brace if she's able to find it. She will call office if she's not able to find her back brace. Outpatient Physical therapy has restricted visits at this time. We will continue the above and will await Ms. Stai return call in two weeks. Medication Change: Hydrocodone to 10/325 mg one tablet every 6 hours. Instructed to call off ice in two weeks for evaluation. She verbalizes understanding.   Telephone Call  Location of patient: Home Location of provider: Office Total Time Spent: 15 Minutes   F/U in 1 month

## 2018-10-02 MED ORDER — CEFDINIR 300 MG PO CAPS: 300.0000 mg | ORAL_CAPSULE | Freq: Two times a day (BID) | ORAL | 0 refills | Status: DC

## 2018-10-02 NOTE — Addendum Note (Signed)
Addended by: Lamar Blinks C on: 10/02/2018 04:53 PM   Modules accepted: Orders

## 2018-10-03 LAB — URINE CULTURE
MICRO NUMBER:: 435504
SPECIMEN QUALITY:: ADEQUATE

## 2018-10-03 NOTE — Addendum Note (Signed)
Addended by: Lamar Blinks C on: 10/03/2018 01:06 PM   Modules accepted: Orders

## 2018-10-05 ENCOUNTER — Ambulatory Visit (INDEPENDENT_AMBULATORY_CARE_PROVIDER_SITE_OTHER): Payer: Medicare Other | Admitting: Family Medicine

## 2018-10-05 ENCOUNTER — Other Ambulatory Visit: Payer: Self-pay

## 2018-10-05 DIAGNOSIS — R198 Other specified symptoms and signs involving the digestive system and abdomen: Secondary | ICD-10-CM

## 2018-10-05 DIAGNOSIS — R1310 Dysphagia, unspecified: Secondary | ICD-10-CM

## 2018-10-05 DIAGNOSIS — R300 Dysuria: Secondary | ICD-10-CM

## 2018-10-05 MED ORDER — PHENAZOPYRIDINE HCL 100 MG PO TABS: 100.0000 mg | ORAL_TABLET | Freq: Three times a day (TID) | ORAL | 0 refills | Status: DC | PRN

## 2018-10-05 NOTE — Progress Notes (Signed)
[place] at [facility] [address], [place], [place] [address] [phone]  Date:  10/05/2018   Name:  Staley Budzinski   DOB:  1933/09/20   MRN:  [id number]  PCP:  Darreld Mclean, MD    Chief Complaint: No chief complaint on file.   History of Present Illness:  Valena Ivanov is a 85 y.o. very pleasant female patient who presents with the following:  Telephone visit today due to concern of throat irritation Patient location is home Provider location is office  I saw this patient on April 29 in the office, with concern of back pain We did a urine culture due to concern for frequent UTI.  It ended up growing Pseudomonas, so I prescribed Omnicef by mouth for 1 week However she is having a hard time swallowing the capsule as they are large.  Advised that she can open the capsule and put in applesauce, etc.  She did not know this, will try it   My nurse called her to today to ask about lyrica her Lyrica prescription- I got a form that this is not covered by her insurance.  However she does not want to continue to take this anyway.  She feels like it does not really help her She has been taking this for over a year.  I advised her to taper off this medication by taking it BID for one week and then daily for one week, then she can stop    Patient Active Problem List   Diagnosis Date Noted  . Lumbar post-laminectomy syndrome 07/31/2018  . Heartburn   . Osteopenia 04/15/2018  . Dyslipidemia 02/02/2018  . Gastroesophageal reflux disease 02/02/2018  . Primary osteoarthritis of left knee 10/17/2017  . History of right knee joint replacement 10/17/2017  . Pain in both lower extremities 08/02/2017  . Stage 1 breast cancer, ER+, right ([place]) 07/04/2017  . 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 Paresthesia 02/09/2017  . Low back pain 02/09/2017  . Gait abnormality 02/09/2017  . OA (osteoarthritis) of hip 05/09/2015     Past Medical History:  Diagnosis Date  . Arthritis   . Cancer ([facility])    HX BREAST CANCER/ SKIN CANCER  . Complication of anesthesia    N/V WITH MORPHINE  . Difficulty sleeping   . Fractured hip ([place])    LEFT - AUG 2016  . GERD (gastroesophageal reflux disease)   . Hyperlipidemia   . Hypertension   . Melanoma ([place])   . Neuropathy   . Nocturia   . Osteopenia   . 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 PONV (postoperative nausea and vomiting)    PT STATES MORPHINE CAUSED N/V  . Rosacea   . Sleep apnea   . Stage 1 breast cancer, ER+, right ([place]) 07/04/2017    Past Surgical History:  Procedure Laterality Date  . [place] STUDY N/A 06/21/2018   Procedure: [place] STUDY;  Surgeon: Lavena Bullion, DO;  [facility];  Service: Gastroenterology;  Laterality: N/A;  with impedance  . ABDOMINAL HYSTERECTOMY  2010  . APPENDECTOMY  1949  . [place]  . BREAST SURGERY    . CATARACT EXTRACTION Bilateral 2011  . CHOLECYSTECTOMY    . ESOPHAGEAL MANOMETRY N/A 06/21/2018   Procedure: ESOPHAGEAL MANOMETRY (EM);  Surgeon: Lavena Bullion, DO;  [facility];  Service: Gastroenterology;  Laterality: N/A;  . GALLBLADDER SURGERY  2015  . JOINT REPLACEMENT     RT TOTAL HIP / RT TOTAL KNEE  . MASTECTOMY  1998   BILATERAL   . [place] IMPEDANCE STUDY  06/21/2018   Procedure: [place] IMPEDANCE STUDY;  Surgeon: Lavena Bullion, DO;  [facility];  Service: Gastroenterology;;  . SKIN CANCER EXCISION  2016   RT SIDE OF NOSE  . TONSILLECTOMY  1953  . TOTAL HIP ARTHROPLASTY  2010   RIGHT  . TOTAL HIP ARTHROPLASTY Left 05/09/2015   Procedure: LEFT TOTAL HIP ARTHROPLASTY ANTERIOR APPROACH;  Surgeon: Gaynelle Arabian, MD;  [facility];  Service: Orthopedics;  Laterality: Left;  . TOTAL KNEE ARTHROPLASTY  2001  . TUMOR REMOVAL  2012   ABDOMINAL - NON CANCEROUS    Social History   Tobacco Use  . Smoking status: Former Smoker    Last attempt  to quit: 02/03/1976    Years since quitting: 42.6  . Smokeless tobacco: Never Used  Substance Use Topics  . Alcohol use: No  . Drug use: No    Family History  Problem Relation Age of Onset  . Other Mother        complications from flu  . Other Father        unsure of cause  . Colon cancer Neg Hx     Allergies  Allergen Reactions  . Morphine And Related Nausea And Vomiting    Medication list has been reviewed and updated.  Current Outpatient Medications on File Prior to Visit  Medication Sig Dispense Refill  . Alum & Mag Hydroxide-Simeth (GI COCKTAIL) SUSP suspension Take 10 mLs by mouth 2 (two) times daily as needed for indigestion. Shake well. 900 mL 2  . AMBULATORY NON FORMULARY MEDICATION Take 10 mLs by mouth every 4 (four) hours as needed. Medication Name: Viscous Lidocaine, Bentyl, Maalox (equal parts) Take 67mL by mouth everey 4 hours as needed. 120 mL 3  . atorvastatin (LIPITOR) 20 MG tablet Take 1 tablet (20 mg total) by mouth daily. 90 tablet 1  . CALCIUM PO Take 1,200 mg by mouth daily.    . cefdinir (OMNICEF) 300 MG capsule Take 1 capsule (300 mg total) by mouth 2 (two) times daily. 14 capsule 0  . Cholecalciferol (VITAMIN D) 2000 units tablet Take 2,000 Units by mouth.    . cyclobenzaprine (FLEXERIL) 5 MG tablet 0.5  Tablet at bedtime as needed for muscle spasm 20 tablet 2  . diclofenac sodium (VOLTAREN) 1 % GEL Apply 1 application topically 3 (three) times daily. To feet, ankles, and knees. 3 Tube 4  . ergocalciferol (VITAMIN D2) 1.25 MG (50000 UT) capsule Take 1 capsule (50,000 Units total) by mouth once a week. 12 capsule 6  . HYDROcodone-acetaminophen (NORCO) 10-325 MG tablet Take 1 tablet by mouth every 6 (six) hours as needed. 120 tablet 0  . LINZESS 290 MCG CAPS capsule Take 1 capsule by mouth daily.    Marland Kitchen losartan (COZAAR) 25 MG tablet Take 1 tablet (25 mg total) by mouth daily. 90 tablet 1  . ondansetron (ZOFRAN-ODT) 4 MG disintegrating tablet as needed.    .  pregabalin (LYRICA) 100 MG capsule Take 1 capsule (100 mg total) by mouth 3 (three) times daily. 270 capsule 1  . tobramycin-dexamethasone (TOBRADEX) ophthalmic solution tobramycin 0.3 %-dexamethasone 0.1 % eye drops,suspension  INSTILL 1 DROP INTO RIGHT EYE TWICE DAILY FOR 2 WEEKS    . traZODone (DESYREL) 50 MG tablet Take 0.5-1 tablets (25-50 mg total) by mouth at bedtime.  30 tablet 2   No current facility-administered medications on file prior to visit.     Review of Systems:  No fever chills  Physical Examination: There were no vitals filed for this visit. There were no vitals filed for this visit. There is no height or weight on file to calculate BMI. Ideal Body Weight:    Spoke with patient on phone, she sounds her normal self, no cough, wheezing, distress  Assessment and Plan: Difficulty swallowing pills  Dysuria - Plan: phenazopyridine (PYRIDIUM) 100 MG tablet  Telephone visit today for a couple of concerns Spoke with pt for 6 minutes She wishes to stop Lyrica, went over how to taper this medication Advised her that she may open her Omnicef capsules, since she has a hard time swallowing them She requests an rx for pyridium- she feels that the rx works better than OTC.  Sent in this Rx for her  She will let me know if she needs anything further Signed Lamar Blinks, MD

## 2018-10-11 ENCOUNTER — Inpatient Hospital Stay: Payer: Medicare Other | Attending: Hematology & Oncology

## 2018-10-11 ENCOUNTER — Other Ambulatory Visit: Payer: Medicare Other

## 2018-10-11 ENCOUNTER — Telehealth: Payer: Self-pay | Admitting: *Deleted

## 2018-10-11 ENCOUNTER — Other Ambulatory Visit: Payer: Self-pay

## 2018-10-11 ENCOUNTER — Ambulatory Visit (HOSPITAL_BASED_OUTPATIENT_CLINIC_OR_DEPARTMENT_OTHER)
Admission: RE | Admit: 2018-10-11 | Discharge: 2018-10-11 | Disposition: A | Discharge status: Home / Self Care | Payer: Medicare Other | Source: Ambulatory Visit | Attending: Hematology & Oncology | Admitting: Hematology & Oncology

## 2018-10-11 ENCOUNTER — Encounter (HOSPITAL_BASED_OUTPATIENT_CLINIC_OR_DEPARTMENT_OTHER): Payer: Self-pay

## 2018-10-11 DIAGNOSIS — Z170 Estrogen receptor positive status [ER+]: Secondary | ICD-10-CM | POA: Diagnosis not present

## 2018-10-11 DIAGNOSIS — M818 Other osteoporosis without current pathological fracture: Secondary | ICD-10-CM

## 2018-10-11 DIAGNOSIS — T386X5A Adverse effect of antigonadotrophins, antiestrogens, antiandrogens, not elsewhere classified, initial encounter: Secondary | ICD-10-CM | POA: Insufficient documentation

## 2018-10-11 DIAGNOSIS — C50911 Malignant neoplasm of unspecified site of right female breast: Secondary | ICD-10-CM | POA: Diagnosis not present

## 2018-10-11 DIAGNOSIS — S3210XA Unspecified fracture of sacrum, initial encounter for closed fracture: Secondary | ICD-10-CM | POA: Diagnosis not present

## 2018-10-11 DIAGNOSIS — Z853 Personal history of malignant neoplasm of breast: Secondary | ICD-10-CM | POA: Insufficient documentation

## 2018-10-11 DIAGNOSIS — N390 Urinary tract infection, site not specified: Secondary | ICD-10-CM | POA: Diagnosis not present

## 2018-10-11 LAB — CMP (CANCER CENTER ONLY)
ALT: 56 U/L — ABNORMAL HIGH (ref 0–44)
AST: 62 U/L — ABNORMAL HIGH (ref 15–41)
Albumin: 4.1 g/dL (ref 3.5–5.0)
Alkaline Phosphatase: 156 U/L — ABNORMAL HIGH (ref 38–126)
Anion gap: 7 (ref 5–15)
BUN: 16 mg/dL (ref 8–23)
CO2: 29 mmol/L (ref 22–32)
Calcium: 9.9 mg/dL (ref 8.9–10.3)
Chloride: 102 mmol/L (ref 98–111)
Creatinine: 0.77 mg/dL (ref 0.44–1.00)
GFR, Est AFR Am: >60 mL/min (ref >60)
GFR, Estimated: >60 mL/min (ref >60)
Glucose, Bld: 102 mg/dL — ABNORMAL HIGH (ref 70–99)
Potassium: 4.1 mmol/L (ref 3.5–5.1)
Sodium: 138 mmol/L (ref 135–145)
Total Bilirubin: 0.7 mg/dL (ref 0.3–1.2)
Total Protein: 7.4 g/dL (ref 6.5–8.1)

## 2018-10-11 LAB — CBC WITH DIFFERENTIAL (CANCER CENTER ONLY)
Abs Immature Granulocytes: 0.01 10*3/uL (ref 0.00–0.07)
Basophils Absolute: 0 10*3/uL (ref 0.0–0.1)
Basophils Relative: 0 %
Eosinophils Absolute: 0.3 10*3/uL (ref 0.0–0.5)
Eosinophils Relative: 4 %
HCT: 42.8 % (ref 36.0–46.0)
Hemoglobin: 13.5 g/dL (ref 12.0–15.0)
Immature Granulocytes: 0 %
Lymphocytes Relative: 34 %
Lymphs Abs: 2.3 10*3/uL (ref 0.7–4.0)
MCH: 28.6 pg (ref 26.0–34.0)
MCHC: 31.5 g/dL (ref 30.0–36.0)
MCV: 90.7 fL (ref 80.0–100.0)
Monocytes Absolute: 0.6 10*3/uL (ref 0.1–1.0)
Monocytes Relative: 8 %
Neutro Abs: 3.8 10*3/uL (ref 1.7–7.7)
Neutrophils Relative %: 54 %
Platelet Count: 148 10*3/uL — ABNORMAL LOW (ref 150–400)
RBC: 4.72 MIL/uL (ref 3.87–5.11)
RDW: 13.7 % (ref 11.5–15.5)
WBC Count: 7 10*3/uL (ref 4.0–10.5)
nRBC: 0 % (ref 0.0–0.2)

## 2018-10-11 MED ORDER — IOHEXOL 300 MG/ML  SOLN
100.0000 mL | Freq: Once | INTRAMUSCULAR | Status: AC | PRN
  Administered 2018-10-11: 13:00:00 100 mL via INTRAVENOUS

## 2018-10-11 NOTE — Telephone Encounter (Addendum)
Called patient with below information  ----- Message from [name], MD sent at 10/11/2018  2:52 PM EDT ----- Call - the CT scan looks pretty stable!!!  Laurey Arrow

## 2018-10-15 MED ORDER — CEPHALEXIN 500 MG PO CAPS: 500.0000 mg | ORAL_CAPSULE | Freq: Two times a day (BID) | ORAL | 0 refills | Status: DC

## 2018-10-16 ENCOUNTER — Telehealth: Payer: Self-pay | Admitting: Family Medicine

## 2018-10-16 DIAGNOSIS — N390 Urinary tract infection, site not specified: Secondary | ICD-10-CM

## 2018-10-16 LAB — URINE CULTURE
MICRO NUMBER:: 470679
SPECIMEN QUALITY:: ADEQUATE

## 2018-10-16 MED ORDER — CIPROFLOXACIN HCL 250 MG PO TABS: 250.0000 mg | ORAL_TABLET | Freq: Two times a day (BID) | ORAL | 0 refills | Status: DC

## 2018-10-16 NOTE — Telephone Encounter (Signed)
Called pt as urine culture final came back- pseudomonas  Will need to change antibiotic therapy to cipro  Her renal function is good Rx for Cipro 250 twice daily for 7 days Patient is aware, understands  Meds ordered this encounter  Medications  . ciprofloxacin (CIPRO) 250 MG tablet    Sig: Take 1 tablet (250 mg total) by mouth 2 (two) times daily.    Dispense:  14 tablet    Refill:  0

## 2018-10-18 ENCOUNTER — Ambulatory Visit: Payer: Medicare Other | Admitting: Hematology & Oncology

## 2018-10-18 ENCOUNTER — Inpatient Hospital Stay: Payer: Medicare Other | Admitting: Family

## 2018-10-18 ENCOUNTER — Other Ambulatory Visit: Payer: Medicare Other

## 2018-10-20 ENCOUNTER — Encounter: Payer: Medicare Other | Attending: Physical Medicine & Rehabilitation | Admitting: Registered Nurse

## 2018-10-20 ENCOUNTER — Encounter: Payer: Self-pay | Admitting: Registered Nurse

## 2018-10-20 ENCOUNTER — Other Ambulatory Visit: Payer: Self-pay

## 2018-10-20 VITALS — Ht 60.0 in | Wt 232.0 lb

## 2018-10-20 DIAGNOSIS — M8580 Other specified disorders of bone density and structure, unspecified site: Secondary | ICD-10-CM | POA: Insufficient documentation

## 2018-10-20 DIAGNOSIS — M5416 Radiculopathy, lumbar region: Secondary | ICD-10-CM | POA: Diagnosis not present

## 2018-10-20 DIAGNOSIS — S32000S Wedge compression fracture of unspecified lumbar vertebra, sequela: Secondary | ICD-10-CM

## 2018-10-20 DIAGNOSIS — Z9049 Acquired absence of other specified parts of digestive tract: Secondary | ICD-10-CM | POA: Insufficient documentation

## 2018-10-20 DIAGNOSIS — G894 Chronic pain syndrome: Secondary | ICD-10-CM

## 2018-10-20 DIAGNOSIS — Z853 Personal history of malignant neoplasm of breast: Secondary | ICD-10-CM | POA: Insufficient documentation

## 2018-10-20 DIAGNOSIS — M961 Postlaminectomy syndrome, not elsewhere classified: Secondary | ICD-10-CM | POA: Diagnosis not present

## 2018-10-20 DIAGNOSIS — M62838 Other muscle spasm: Secondary | ICD-10-CM

## 2018-10-20 DIAGNOSIS — M1712 Unilateral primary osteoarthritis, left knee: Secondary | ICD-10-CM | POA: Insufficient documentation

## 2018-10-20 DIAGNOSIS — R202 Paresthesia of skin: Secondary | ICD-10-CM | POA: Diagnosis not present

## 2018-10-20 DIAGNOSIS — G8929 Other chronic pain: Secondary | ICD-10-CM | POA: Insufficient documentation

## 2018-10-20 DIAGNOSIS — Z87891 Personal history of nicotine dependence: Secondary | ICD-10-CM | POA: Insufficient documentation

## 2018-10-20 DIAGNOSIS — M7061 Trochanteric bursitis, right hip: Secondary | ICD-10-CM | POA: Diagnosis not present

## 2018-10-20 DIAGNOSIS — M2550 Pain in unspecified joint: Secondary | ICD-10-CM

## 2018-10-20 DIAGNOSIS — Z8489 Family history of other specified conditions: Secondary | ICD-10-CM | POA: Insufficient documentation

## 2018-10-20 DIAGNOSIS — Z5181 Encounter for therapeutic drug level monitoring: Secondary | ICD-10-CM

## 2018-10-20 DIAGNOSIS — K219 Gastro-esophageal reflux disease without esophagitis: Secondary | ICD-10-CM | POA: Insufficient documentation

## 2018-10-20 DIAGNOSIS — Z9889 Other specified postprocedural states: Secondary | ICD-10-CM | POA: Insufficient documentation

## 2018-10-20 DIAGNOSIS — Z9013 Acquired absence of bilateral breasts and nipples: Secondary | ICD-10-CM | POA: Insufficient documentation

## 2018-10-20 DIAGNOSIS — Z8673 Personal history of transient ischemic attack (TIA), and cerebral infarction without residual deficits: Secondary | ICD-10-CM | POA: Insufficient documentation

## 2018-10-20 DIAGNOSIS — M7918 Myalgia, other site: Secondary | ICD-10-CM | POA: Insufficient documentation

## 2018-10-20 DIAGNOSIS — M79604 Pain in right leg: Secondary | ICD-10-CM | POA: Insufficient documentation

## 2018-10-20 DIAGNOSIS — M7062 Trochanteric bursitis, left hip: Secondary | ICD-10-CM

## 2018-10-20 DIAGNOSIS — Z96643 Presence of artificial hip joint, bilateral: Secondary | ICD-10-CM | POA: Insufficient documentation

## 2018-10-20 DIAGNOSIS — I10 Essential (primary) hypertension: Secondary | ICD-10-CM | POA: Insufficient documentation

## 2018-10-20 DIAGNOSIS — Z79899 Other long term (current) drug therapy: Secondary | ICD-10-CM

## 2018-10-20 DIAGNOSIS — E785 Hyperlipidemia, unspecified: Secondary | ICD-10-CM | POA: Insufficient documentation

## 2018-10-20 DIAGNOSIS — Z85820 Personal history of malignant melanoma of skin: Secondary | ICD-10-CM | POA: Insufficient documentation

## 2018-10-20 DIAGNOSIS — Z96651 Presence of right artificial knee joint: Secondary | ICD-10-CM | POA: Insufficient documentation

## 2018-10-20 DIAGNOSIS — M79605 Pain in left leg: Secondary | ICD-10-CM | POA: Insufficient documentation

## 2018-10-20 DIAGNOSIS — G4730 Sleep apnea, unspecified: Secondary | ICD-10-CM | POA: Insufficient documentation

## 2018-10-20 MED ORDER — CYCLOBENZAPRINE HCL 5 MG PO TABS: 5.0000 mg | ORAL_TABLET | Freq: Every evening | ORAL | 2 refills | Status: DC | PRN

## 2018-10-20 MED ORDER — HYDROCODONE-ACETAMINOPHEN 10-325 MG PO TABS: 1.0000 | ORAL_TABLET | Freq: Four times a day (QID) | ORAL | 0 refills | Status: DC | PRN

## 2018-10-20 NOTE — Progress Notes (Signed)
Subjective:    Patient ID: Crystal Lee, female    DOB: Mar 12, 1934, 85 y.o.   MRN: [id number]  HPI: Crystal Lee is a 85 y.o. female her appointment was changed, due to national recommendations of social distancing due to [place] 19, an audio/video telehealth visit is felt to be most appropriate for this patient at this time.  See Chart message from today for the patient's consent to telehealth from [place].     She states her pain is located in her lower back radiating into her lower extremities. She rates her pain 7. Her current exercise regime is walking.  Crystal Lee states she had a bout of nausea and vomiting last night and this morning around 10 am, she refuse to be evaluated at ED or urgent care, she was instructed to go to Urgent care or ED for evaluation if she develops N/V, she verbalizes understanding. She states she will take a Zofran today and denies any changes with her diet.   Crystal Lee Morphine equivalent is 40.00 MME. Last oral swab was performed on 06/02/2018, it was consistent.   Geryl Rankins CMA asked the Health and History Questions. This provider and Mancel Parsons  verified we were speaking with the correct person using two identifiers.   Pain Inventory Average Pain 7 Pain Right Now 7 My pain is intermittent, burning, tingling and aching  In the last 24 hours, has pain interfered with the following? General activity 7 Relation with others 7 Enjoyment of life 7 What TIME of day is your pain at its worst? varies Sleep (in general) Poor  Pain is worse with: walking, bending, standing and some activites Pain improves with: rest and medication Relief from Meds: 5  Mobility walk with assistance use a cane use a walker how many minutes can you walk? 5 ability to climb steps?  no do you drive?  no  Function disabled: date disabled .  Neuro/Psych weakness tingling trouble walking  Prior Studies Any changes  since last visit?  no  Physicians involved in your care Any changes since last visit?  no   Family History  Problem Relation Age of Onset  . Other Mother        complications from flu  . Other Father        unsure of cause  . Colon cancer Neg Hx    Social History   Socioeconomic History  . Marital status: Married    Spouse name: Not on file  . Number of children: 3  . Years of education: 16 years  . Highest education level: Not on file  Occupational History  . Occupation: Retired  Scientific laboratory technician  . Financial resource strain: Not on file  . Food insecurity:    Worry: Not on file    Inability: Not on file  . Transportation needs:    Medical: Not on file    Non-medical: Not on file  Tobacco Use  . Smoking status: Former Smoker    Last attempt to quit: 02/03/1976    Years since quitting: 42.7  . Smokeless tobacco: Never Used  Substance and Sexual Activity  . Alcohol use: No  . Drug use: No  . Sexual activity: Yes    Birth control/protection: Post-menopausal  Lifestyle  . Physical activity:    Days per week: Not on file    Minutes per session: Not on file  . Stress: Not on file  Relationships  . Social connections:  Talks on phone: Not on file    Gets together: Not on file    Attends religious service: Not on file    Active member of club or organization: Not on file    Attends meetings of clubs or organizations: Not on file    Relationship status: Not on file  Other Topics Concern  . Not on file  Social History Narrative   Lives at home with husband.   Right-handed.   No caffeine use.   Past Surgical History:  Procedure Laterality Date  . [place] STUDY N/A 06/21/2018   Procedure: [place] STUDY;  Surgeon: Lavena Bullion, DO;  [facility];  Service: Gastroenterology;  Laterality: N/A;  with impedance  . ABDOMINAL HYSTERECTOMY  2010  . APPENDECTOMY  1949  . [place]  . BREAST SURGERY    . CATARACT EXTRACTION Bilateral 2011  .  CHOLECYSTECTOMY    . ESOPHAGEAL MANOMETRY N/A 06/21/2018   Procedure: ESOPHAGEAL MANOMETRY (EM);  Surgeon: Lavena Bullion, DO;  [facility];  Service: Gastroenterology;  Laterality: N/A;  . GALLBLADDER SURGERY  2015  . JOINT REPLACEMENT     RT TOTAL HIP / RT TOTAL KNEE  . MASTECTOMY  1998   BILATERAL   . [place] IMPEDANCE STUDY  06/21/2018   Procedure: [place] IMPEDANCE STUDY;  Surgeon: Lavena Bullion, DO;  [facility];  Service: Gastroenterology;;  . SKIN CANCER EXCISION  2016   RT SIDE OF NOSE  . TONSILLECTOMY  1953  . TOTAL HIP ARTHROPLASTY  2010   RIGHT  . TOTAL HIP ARTHROPLASTY Left 05/09/2015   Procedure: LEFT TOTAL HIP ARTHROPLASTY ANTERIOR APPROACH;  Surgeon: Gaynelle Arabian, MD;  Location: [facility];  Service: Orthopedics;  Laterality: Left;  . TOTAL KNEE ARTHROPLASTY  2001  . TUMOR REMOVAL  2012   ABDOMINAL - NON CANCEROUS   Past Medical History:  Diagnosis Date  . Arthritis   . Cancer (HCC)    HX BREAST CANCER/ SKIN CANCER  . Complication of anesthesia    N/V WITH MORPHINE  . Difficulty sleeping   . Fractured hip ([place])    LEFT - AUG 2016  . GERD (gastroesophageal reflux disease)   . Hyperlipidemia   . Hypertension   . Melanoma ([place])   . Neuropathy   . Nocturia   . Osteopenia   . 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 PONV (postoperative nausea and vomiting)    PT STATES MORPHINE CAUSED N/V  . Rosacea   . Sleep apnea   . Stage 1 breast cancer, ER+, right ([place]) 07/04/2017   There were no vitals taken for this visit.  Opioid Risk Score:   Fall Risk Score:  `1  Depression screen PHQ 2/9  Depression screen [facility] 2/9 09/28/2018 06/02/2018 04/10/2018 02/20/2018 10/17/2017  Decreased Interest 0 0 0 0 0  Down, Depressed, Hopeless 0 0 0 0 0  PHQ - 2 Score 0 0 0 0 0  Altered sleeping - - - - 3  Tired, decreased energy - - - - 3  Change in appetite - - - - 0  Feeling bad or failure about yourself  - - - - 0  Trouble concentrating - - - - 0   Moving slowly or fidgety/restless - - - - 0  Suicidal thoughts - - - - 0  PHQ-9 Score - - - - 6  Difficult doing work/chores - - - - Somewhat difficult    Review  of Systems  Constitutional: Negative.   HENT: Negative.   Eyes: Negative.   Respiratory: Negative.   Cardiovascular: Negative.   Gastrointestinal: Negative.   Endocrine: Negative.   Genitourinary: Negative.   Musculoskeletal: Positive for back pain and gait problem.  Skin: Negative.   Allergic/Immunologic: Negative.   Neurological: Positive for weakness.       Tingling  Hematological: Negative.   Psychiatric/Behavioral: Negative.        Objective:   Physical Exam Vitals signs and nursing note reviewed.  Musculoskeletal:     Comments: No Physical Exam Performed: Virtual Visit  Neurological:     Mental Status: She is oriented to person, place, and time.           Assessment & Plan:  1. Chronic Bilateral Leg pain: Paresthesia: Continue Lyrica. Continue to Monitor.10/20/2018 2. Lumbar Radiculitis: Continue Lyrica.10/20/2018 3. Pain of Left Wrist/: No complaints Today:S/P Carpal Tunnel Release on 06/03/2017 by Dr. Ellene Route. Dr. Ellene Route Following.10/20/2018. 4. Fracture of superior pubic ramus.Dr. AluisioFollowing.Continue to monitor. 10/20/2018. 3. Left Knee OA: Continue Voltaren Gel.Continue to monitor. Orthopedist following.10/20/2018. 4. Polyarthralgia: Continue to alternate with heat and ice therapy. Continue current medication regime. Continue to monitor.10/20/2018. 5. Chronic Pain Syndrome:Refilled::Hydrocodone10/325 mg one tablet every 6 hours as needed for moderate pain #120.10/20/2018. 6. Lumbar Compression Fracture L2 and L3:  Continue with rest/ heat therapy. Continue to Monitor.10/20/2018 7. Muscle Spasm> Ms. Macchia reports she has been taking Flexeril 5 mg at HS without any adverse reaction. New prescription sent. She verbalizes understanding.   Telephone Call  Location of patient:  In her Home Location of provider: Office Established patient Time spent on call:15 Minutes

## 2018-10-30 ENCOUNTER — Other Ambulatory Visit: Payer: Self-pay

## 2018-10-30 ENCOUNTER — Inpatient Hospital Stay: Payer: Medicare Other | Attending: Hematology & Oncology | Admitting: Family

## 2018-10-30 ENCOUNTER — Encounter: Payer: Self-pay | Admitting: Family

## 2018-10-30 VITALS — BP 158/71 | HR 86 | Temp 98.6°F | Resp 16 | Wt 232.0 lb

## 2018-10-30 DIAGNOSIS — R740 Nonspecific elevation of levels of transaminase and lactic acid dehydrogenase [LDH]: Secondary | ICD-10-CM | POA: Insufficient documentation

## 2018-10-30 DIAGNOSIS — Z853 Personal history of malignant neoplasm of breast: Secondary | ICD-10-CM | POA: Diagnosis not present

## 2018-10-30 DIAGNOSIS — Z9013 Acquired absence of bilateral breasts and nipples: Secondary | ICD-10-CM | POA: Insufficient documentation

## 2018-10-30 DIAGNOSIS — E559 Vitamin D deficiency, unspecified: Secondary | ICD-10-CM

## 2018-10-30 DIAGNOSIS — R590 Localized enlarged lymph nodes: Secondary | ICD-10-CM

## 2018-10-30 DIAGNOSIS — C50911 Malignant neoplasm of unspecified site of right female breast: Secondary | ICD-10-CM

## 2018-10-30 DIAGNOSIS — G629 Polyneuropathy, unspecified: Secondary | ICD-10-CM | POA: Insufficient documentation

## 2018-10-30 NOTE — Progress Notes (Signed)
Hematology and Oncology Follow Up Visit  Crystal Lee [id number] 03/24/1934 85 y.o. 10/30/2018   Principle Diagnosis:  Bilateral stage Ia breast cancer-1998; thoracic adenopathy of undetermined significance  Current Therapy:   Observation   Interim History:  Crystal Lee is here today for follow-up. She has had a rough couple of months. Unfortunately, her husband passed away in Aug 03, 2022 and then she was treated for a recurrent UTI.  She states that she does not sleep well at night.  I reviewed her CT scans from last week with Dr. Marin Olp which showed stable mild adenopathy of the chest and upper abdomen.                                   Bilateral chest exam today was negative. She has had bilateral mastectomies. No mass, lesion or rash noted.  No fever, chills, n/v, cough, rash, dizziness, SOB, chest pain, palpitations, abdominal pain or changes in bowel or bladder habits.  She states that she has follow-up soon with Dr. Ellene Route to discuss potential interventions for the fractures in her lumbar spine.  The neuropathy in her lower extremities is stable and unchanged since her last visit. She has intermittent numbness and tingling in her hands which she attributes to carpal tunnel syndrome. No falls or syncopal episodes to report.  She ambulates mostly with a walker but also uses a can for support.  No lymphadenopathy noted on exam.  She states that she is eating but now that it is just her at home she has not been eating very healthy. She is staying hydrated and her weight has remained stable.   ECOG Performance Status: 1 - Symptomatic but completely ambulatory  Medications:  Allergies as of 10/30/2018      Reactions   Morphine And Related Nausea And Vomiting      Medication List       Accurate as of October 30, 2018  3:18 PM. If you have any questions, ask your nurse or doctor.        AMBULATORY NON FORMULARY MEDICATION Take 10 mLs by mouth every 4 (four) hours as needed. Medication  Name: Viscous Lidocaine, Bentyl, Maalox (equal parts) Take 33mL by mouth everey 4 hours as needed.   atorvastatin 20 MG tablet Commonly known as:  LIPITOR Take 1 tablet (20 mg total) by mouth daily.   CALCIUM PO Take 1,200 mg by mouth daily.   ciprofloxacin 250 MG tablet Commonly known as:  Cipro Take 1 tablet (250 mg total) by mouth 2 (two) times daily.   cyclobenzaprine 5 MG tablet Commonly known as:  FLEXERIL Take 1 tablet (5 mg total) by mouth at bedtime as needed for muscle spasms.   diclofenac sodium 1 % Gel Commonly known as:  VOLTAREN Apply 1 application topically 3 (three) times daily. To feet, ankles, and knees.   ergocalciferol 1.25 MG (50000 UT) capsule Commonly known as:  VITAMIN D2 Take 1 capsule (50,000 Units total) by mouth once a week.   gi cocktail Susp suspension Take 10 mLs by mouth 2 (two) times daily as needed for indigestion. Shake well.   HYDROcodone-acetaminophen 10-325 MG tablet Commonly known as:  NORCO Take 1 tablet by mouth every 6 (six) hours as needed.   Linzess 290 MCG Caps capsule Generic drug:  linaclotide Take 1 capsule by mouth daily.   losartan 25 MG tablet Commonly known as:  COZAAR Take 1 tablet (25 mg total)  by mouth daily.   ondansetron 4 MG disintegrating tablet Commonly known as:  ZOFRAN-ODT as needed.   phenazopyridine 100 MG tablet Commonly known as:  Pyridium Take 1 tablet (100 mg total) by mouth 3 (three) times daily as needed for pain. For bladder pain   pregabalin 100 MG capsule Commonly known as:  LYRICA Take 1 capsule (100 mg total) by mouth 3 (three) times daily.   tobramycin-dexamethasone ophthalmic solution Commonly known as:  TOBRADEX tobramycin 0.3 %-dexamethasone 0.1 % eye drops,suspension  INSTILL 1 DROP INTO RIGHT EYE TWICE DAILY FOR 2 WEEKS   traZODone 50 MG tablet Commonly known as:  DESYREL Take 0.5-1 tablets (25-50 mg total) by mouth at bedtime.   Vitamin D 50 MCG (2000 UT) tablet Take 2,000  Units by mouth.       Allergies:  Allergies  Allergen Reactions  . Morphine And Related Nausea And Vomiting    Past Medical History, Surgical history, Social history, and Family History were reviewed and updated.  Review of Systems: All other 10 point review of systems is negative.   Physical Exam:  weight is 232 lb (105.2 kg). Her oral temperature is 98.6 F (37 C). Her blood pressure is 158/71 (abnormal) and her pulse is 86. Her respiration is 16 and oxygen saturation is 99%.   Wt Readings from Last 3 Encounters:  10/30/18 232 lb (105.2 kg)  10/20/18 232 lb (105.2 kg)  09/28/18 232 lb (105.2 kg)    Ocular: Sclerae unicteric, pupils equal, round and reactive to light Ear-nose-throat: Oropharynx clear, dentition fair Lymphatic: No cervical or supraclavicular adenopathy Lungs no rales or rhonchi, good excursion bilaterally Heart regular rate and rhythm, no murmur appreciated Abd soft, nontender, positive bowel sounds, no liver or spleen tip palpated on exam, no fluid wave  MSK no focal spinal tenderness, no joint edema Neuro: non-focal, well-oriented, appropriate affect Breasts: Exam today was negative. No mass, lesion or rash noted.   Lab Results  Component Value Date   WBC 7.0 10/11/2018   HGB 13.5 10/11/2018   HCT 42.8 10/11/2018   MCV 90.7 10/11/2018   PLT 148 (L) 10/11/2018   Lab Results  Component Value Date   FERRITIN 131 02/09/2017   Lab Results  Component Value Date   RBC 4.72 10/11/2018   No results found for: [name] [facility] Lab Results  Component Value Date   IGGSERUM 1,085 02/09/2017   IGMSERUM 336 (H) 02/09/2017   No results found for: [name], SPEI   Chemistry      Component Value Date/Time   NA 138 10/11/2018 0855   NA 142 02/09/2017 1039   K 4.1 10/11/2018 0855   CL 102 10/11/2018 0855   CO2 29 10/11/2018 0855   BUN 16 10/11/2018 0855   BUN 13 02/09/2017  1039   CREATININE 0.77 10/11/2018 0855      Component Value Date/Time   CALCIUM 9.9 10/11/2018 0855   ALKPHOS 156 (H) 10/11/2018 0855   AST 62 (H) 10/11/2018 0855   ALT 56 (H) 10/11/2018 0855   BILITOT 0.7 10/11/2018 0855       Impression and Plan: Crystal Lee is a very pleasant 85 yo caucasian female with history of bilateral stage Ia breast cancer diagnosed back in 1998.  So far, she continues to do well.  CT scans showed stable mild adenopathy of the chest and upper abdomen.  We will plan to see her back in another 6 months and repeat  scans at that time. I did forward her labs work from last week to GI so they can review her elevated LFT's.  She will contact our office with any questions or concerns. We can certainly see her sooner if need be.   Laverna Peace, NP 6/1/20203:18 PM

## 2018-10-31 DIAGNOSIS — D485 Neoplasm of uncertain behavior of skin: Secondary | ICD-10-CM | POA: Diagnosis not present

## 2018-10-31 DIAGNOSIS — L820 Inflamed seborrheic keratosis: Secondary | ICD-10-CM | POA: Diagnosis not present

## 2018-10-31 NOTE — Progress Notes (Signed)
Mildly elevated AST/ALT, similar to prior studies. ALP is again mildly elevated. TBili normal. CBC normal. Previously thought d/t recent pelvic fractures. Given ongoing elevation and mildly elevated AST/ALT, please send for the following:  - Alkaline phosphatase fractionization, GGT, and 5' nucleotidase  - Can schedule virtual office f/u appt with me to review results of these labs - If primary biliary etiology, will discuss sending for MRCP

## 2018-11-03 DIAGNOSIS — S32030A Wedge compression fracture of third lumbar vertebra, initial encounter for closed fracture: Secondary | ICD-10-CM | POA: Diagnosis not present

## 2018-11-03 DIAGNOSIS — S32030G Wedge compression fracture of third lumbar vertebra, subsequent encounter for fracture with delayed healing: Secondary | ICD-10-CM | POA: Diagnosis not present

## 2018-11-07 DIAGNOSIS — S32030G Wedge compression fracture of third lumbar vertebra, subsequent encounter for fracture with delayed healing: Secondary | ICD-10-CM | POA: Diagnosis not present

## 2018-11-07 DIAGNOSIS — Y999 Unspecified external cause status: Secondary | ICD-10-CM | POA: Diagnosis not present

## 2018-11-07 DIAGNOSIS — Y939 Activity, unspecified: Secondary | ICD-10-CM | POA: Diagnosis not present

## 2018-11-07 DIAGNOSIS — X58XXXS Exposure to other specified factors, sequela: Secondary | ICD-10-CM | POA: Diagnosis not present

## 2018-11-07 DIAGNOSIS — Y929 Unspecified place or not applicable: Secondary | ICD-10-CM | POA: Diagnosis not present

## 2018-11-15 ENCOUNTER — Other Ambulatory Visit: Payer: Self-pay

## 2018-11-15 DIAGNOSIS — R7401 Elevation of levels of liver transaminase levels: Secondary | ICD-10-CM

## 2018-11-15 DIAGNOSIS — H16202 Unspecified keratoconjunctivitis, left eye: Secondary | ICD-10-CM | POA: Diagnosis not present

## 2018-11-15 DIAGNOSIS — R748 Abnormal levels of other serum enzymes: Secondary | ICD-10-CM

## 2018-11-16 ENCOUNTER — Other Ambulatory Visit (INDEPENDENT_AMBULATORY_CARE_PROVIDER_SITE_OTHER): Payer: Medicare Other

## 2018-11-16 DIAGNOSIS — R748 Abnormal levels of other serum enzymes: Secondary | ICD-10-CM | POA: Diagnosis not present

## 2018-11-16 DIAGNOSIS — R7401 Elevation of levels of liver transaminase levels: Secondary | ICD-10-CM

## 2018-11-16 DIAGNOSIS — R740 Nonspecific elevation of levels of transaminase and lactic acid dehydrogenase [LDH]: Secondary | ICD-10-CM

## 2018-11-16 LAB — GAMMA GT: GGT: 123 U/L — ABNORMAL HIGH (ref 7–51)

## 2018-11-20 LAB — ALKALINE PHOSPHATASE, ISOENZYMES
Alkaline Phosphatase: 121 IU/L — ABNORMAL HIGH (ref 39–117)
BONE FRACTION: 35 % (ref 14–68)
INTESTINAL FRAC.: 2 % (ref 0–18)
LIVER FRACTION: 63 % (ref 18–85)

## 2018-11-20 LAB — NUCLEOTIDASE, 5', BLOOD: 5-Nucleotidase: 15 IU/L — ABNORMAL HIGH (ref 0–10)

## 2018-11-21 DIAGNOSIS — N390 Urinary tract infection, site not specified: Secondary | ICD-10-CM | POA: Diagnosis not present

## 2018-11-21 DIAGNOSIS — N281 Cyst of kidney, acquired: Secondary | ICD-10-CM | POA: Diagnosis not present

## 2018-11-22 ENCOUNTER — Encounter: Payer: Medicare Other | Attending: Physical Medicine & Rehabilitation | Admitting: Registered Nurse

## 2018-11-22 ENCOUNTER — Encounter: Payer: Self-pay | Admitting: Registered Nurse

## 2018-11-22 ENCOUNTER — Other Ambulatory Visit: Payer: Self-pay

## 2018-11-22 VITALS — BP 134/81 | HR 78 | Temp 97.2°F | Ht 66.0 in | Wt 232.0 lb

## 2018-11-22 DIAGNOSIS — Z9889 Other specified postprocedural states: Secondary | ICD-10-CM | POA: Diagnosis not present

## 2018-11-22 DIAGNOSIS — Z9049 Acquired absence of other specified parts of digestive tract: Secondary | ICD-10-CM | POA: Insufficient documentation

## 2018-11-22 DIAGNOSIS — M2550 Pain in unspecified joint: Secondary | ICD-10-CM | POA: Diagnosis not present

## 2018-11-22 DIAGNOSIS — G4730 Sleep apnea, unspecified: Secondary | ICD-10-CM | POA: Diagnosis not present

## 2018-11-22 DIAGNOSIS — M7918 Myalgia, other site: Secondary | ICD-10-CM | POA: Diagnosis not present

## 2018-11-22 DIAGNOSIS — Z8489 Family history of other specified conditions: Secondary | ICD-10-CM | POA: Diagnosis not present

## 2018-11-22 DIAGNOSIS — Z853 Personal history of malignant neoplasm of breast: Secondary | ICD-10-CM | POA: Diagnosis not present

## 2018-11-22 DIAGNOSIS — M961 Postlaminectomy syndrome, not elsewhere classified: Secondary | ICD-10-CM | POA: Diagnosis not present

## 2018-11-22 DIAGNOSIS — Z85820 Personal history of malignant melanoma of skin: Secondary | ICD-10-CM | POA: Insufficient documentation

## 2018-11-22 DIAGNOSIS — Z8673 Personal history of transient ischemic attack (TIA), and cerebral infarction without residual deficits: Secondary | ICD-10-CM | POA: Insufficient documentation

## 2018-11-22 DIAGNOSIS — M8580 Other specified disorders of bone density and structure, unspecified site: Secondary | ICD-10-CM | POA: Insufficient documentation

## 2018-11-22 DIAGNOSIS — Z96643 Presence of artificial hip joint, bilateral: Secondary | ICD-10-CM | POA: Insufficient documentation

## 2018-11-22 DIAGNOSIS — R202 Paresthesia of skin: Secondary | ICD-10-CM

## 2018-11-22 DIAGNOSIS — M79605 Pain in left leg: Secondary | ICD-10-CM | POA: Diagnosis not present

## 2018-11-22 DIAGNOSIS — S32000S Wedge compression fracture of unspecified lumbar vertebra, sequela: Secondary | ICD-10-CM | POA: Diagnosis not present

## 2018-11-22 DIAGNOSIS — G8929 Other chronic pain: Secondary | ICD-10-CM | POA: Diagnosis not present

## 2018-11-22 DIAGNOSIS — Z87891 Personal history of nicotine dependence: Secondary | ICD-10-CM | POA: Insufficient documentation

## 2018-11-22 DIAGNOSIS — Z96651 Presence of right artificial knee joint: Secondary | ICD-10-CM | POA: Insufficient documentation

## 2018-11-22 DIAGNOSIS — Z79899 Other long term (current) drug therapy: Secondary | ICD-10-CM

## 2018-11-22 DIAGNOSIS — G894 Chronic pain syndrome: Secondary | ICD-10-CM

## 2018-11-22 DIAGNOSIS — E785 Hyperlipidemia, unspecified: Secondary | ICD-10-CM | POA: Insufficient documentation

## 2018-11-22 DIAGNOSIS — Z9013 Acquired absence of bilateral breasts and nipples: Secondary | ICD-10-CM | POA: Insufficient documentation

## 2018-11-22 DIAGNOSIS — M79604 Pain in right leg: Secondary | ICD-10-CM | POA: Insufficient documentation

## 2018-11-22 DIAGNOSIS — M1712 Unilateral primary osteoarthritis, left knee: Secondary | ICD-10-CM | POA: Diagnosis not present

## 2018-11-22 DIAGNOSIS — I10 Essential (primary) hypertension: Secondary | ICD-10-CM | POA: Insufficient documentation

## 2018-11-22 DIAGNOSIS — K219 Gastro-esophageal reflux disease without esophagitis: Secondary | ICD-10-CM | POA: Diagnosis not present

## 2018-11-22 DIAGNOSIS — Z5181 Encounter for therapeutic drug level monitoring: Secondary | ICD-10-CM | POA: Diagnosis not present

## 2018-11-22 MED ORDER — HYDROCODONE-ACETAMINOPHEN 10-325 MG PO TABS: 1.0000 | ORAL_TABLET | Freq: Four times a day (QID) | ORAL | 0 refills | Status: DC | PRN

## 2018-11-22 NOTE — Progress Notes (Signed)
Subjective:    Patient ID: Crystal Lee, female    DOB: [birth year], 85 y.o.   MRN: [id number]  HPI: Crystal Lee is a 85 y.o. female who returns for follow up appointment for chronic pain and medication refill. She states her pain is located in her lower back, bilateral lower extremities with tingling and burning and left knee. She rates her pain 6. Her current exercise regime is walking.  Crystal Lee Morphine equivalent is 40.00 MME.  Last Oral Swab was Performed on 06/02/2018, it was consistent.   Pain Inventory Average Pain 5 Pain Right Now 6 My pain is burning, tingling and aching  In the last 24 hours, has pain interfered with the following? General activity 6 Relation with others 0 Enjoyment of life 5 What TIME of day is your pain at its worst? varies Sleep (in general) NA  Pain is worse with: walking, bending, standing and some activites Pain improves with: rest, heat/ice and medication Relief from Meds: 7  Mobility use a cane use a walker ability to climb steps?  no do you drive?  yes  Function retired I need assistance with the following:  household duties and shopping  Neuro/Psych bladder control problems trouble walking  Prior Studies Any changes since last visit?  no  Physicians involved in your care Any changes since last visit?  no   Family History  Problem Relation Age of Onset  . Other Mother        complications from flu  . Other Father        unsure of cause  . Colon cancer Neg Hx    Social History   Socioeconomic History  . Marital status: Married    Spouse name: Not on file  . Number of children: 3  . Years of education: 16 years  . Highest education level: Not on file  Occupational History  . Occupation: Retired  Scientific laboratory technician  . Financial resource strain: Not on file  . Food insecurity    Worry: Not on file    Inability: Not on file  . Transportation needs    Medical: Not on file    Non-medical: Not on file  Tobacco Use  .  Smoking status: Former Smoker    Quit date: 02/03/1976    Years since quitting: 42.8  . Smokeless tobacco: Never Used  Substance and Sexual Activity  . Alcohol use: No  . Drug use: No  . Sexual activity: Yes    Birth control/protection: Post-menopausal  Lifestyle  . Physical activity    Days per week: Not on file    Minutes per session: Not on file  . Stress: Not on file  Relationships  . Social Herbalist on phone: Not on file    Gets together: Not on file    Attends religious service: Not on file    Active member of club or organization: Not on file    Attends meetings of clubs or organizations: Not on file    Relationship status: Not on file  Other Topics Concern  . Not on file  Social History Narrative   Lives at home with husband.   Right-handed.   No caffeine use.   Past Surgical History:  Procedure Laterality Date  . [place] STUDY N/A 06/21/2018   Procedure: [place] STUDY;  Surgeon: Lavena Bullion, DO;  [facility];  Service: Gastroenterology;  Laterality: N/A;  with impedance  . ABDOMINAL HYSTERECTOMY  2010  .  APPENDECTOMY  1949  . [place]  . BREAST SURGERY    . CATARACT EXTRACTION Bilateral 2011  . CHOLECYSTECTOMY    . ESOPHAGEAL MANOMETRY N/A 06/21/2018   Procedure: ESOPHAGEAL MANOMETRY (EM);  Surgeon: Lavena Bullion, DO;  [facility];  Service: Gastroenterology;  Laterality: N/A;  . GALLBLADDER SURGERY  2015  . JOINT REPLACEMENT     RT TOTAL HIP / RT TOTAL KNEE  . MASTECTOMY  1998   BILATERAL   . [place] IMPEDANCE STUDY  06/21/2018   Procedure: [place] IMPEDANCE STUDY;  Surgeon: Lavena Bullion, DO;  [facility];  Service: Gastroenterology;;  . SKIN CANCER EXCISION  2016   RT SIDE OF NOSE  . TONSILLECTOMY  1953  . TOTAL HIP ARTHROPLASTY  2010   RIGHT  . TOTAL HIP ARTHROPLASTY Left 05/09/2015   Procedure: LEFT TOTAL HIP ARTHROPLASTY ANTERIOR APPROACH;  Surgeon: Gaynelle Arabian, MD;  [facility];   Service: Orthopedics;  Laterality: Left;  . TOTAL KNEE ARTHROPLASTY  2001  . TUMOR REMOVAL  2012   ABDOMINAL - NON CANCEROUS   Past Medical History:  Diagnosis Date  . Arthritis   . Cancer (HCC)    HX BREAST CANCER/ SKIN CANCER  . Complication of anesthesia    N/V WITH MORPHINE  . Difficulty sleeping   . Fractured hip ([place])    LEFT - AUG 2016  . GERD (gastroesophageal reflux disease)   . Hyperlipidemia   . Hypertension   . Melanoma ([place])   . Neuropathy   . Nocturia   . Osteopenia   . 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 PONV (postoperative nausea and vomiting)    PT STATES MORPHINE CAUSED N/V  . Rosacea   . Sleep apnea   . Stage 1 breast cancer, ER+, right (HCC) 07/04/2017   BP 134/81   Pulse 78   Temp (!) 97.2 F (36.2 C)   Ht 5\' 6"  (1.676 m)   Wt 232 lb (105.2 kg)   SpO2 95%   BMI 37.45 kg/m   Opioid Risk Score:   Fall Risk Score:  `1  Depression screen PHQ 2/9  Depression screen [facility] 2/9 11/22/2018 09/28/2018 06/02/2018 04/10/2018 02/20/2018 10/17/2017  Decreased Interest 0 0 0 0 0 0  Down, Depressed, Hopeless 0 0 0 0 0 0  PHQ - 2 Score 0 0 0 0 0 0  Altered sleeping - - - - - 3  Tired, decreased energy - - - - - 3  Change in appetite - - - - - 0  Feeling bad or failure about yourself  - - - - - 0  Trouble concentrating - - - - - 0  Moving slowly or fidgety/restless - - - - - 0  Suicidal thoughts - - - - - 0  PHQ-9 Score - - - - - 6  Difficult doing work/chores - - - - - Somewhat difficult    Review of Systems  Constitutional: Negative.   HENT: Negative.   Eyes: Negative.   Respiratory: Negative.   Cardiovascular: Positive for leg swelling.  Gastrointestinal: Positive for constipation.  Endocrine: Negative.   Genitourinary:       Bladder control  Musculoskeletal: Positive for back pain and gait problem.  Skin: Negative.   Allergic/Immunologic: Negative.   Hematological: Negative.   Psychiatric/Behavioral: Negative.   All other systems  reviewed and are negative.      Objective:   Physical Exam Vitals signs and nursing note  reviewed.  Constitutional:      Appearance: Normal appearance.  Neck:     Musculoskeletal: Normal range of motion and neck supple.  Cardiovascular:     Rate and Rhythm: Normal rate and regular rhythm.     Pulses: Normal pulses.     Heart sounds: Normal heart sounds.  Pulmonary:     Effort: Pulmonary effort is normal.     Breath sounds: Normal breath sounds.  Musculoskeletal:     Comments: Normal Muscle Bulk and Muscle Testing Reveals:  Upper Extremities: Full ROM and Muscle Strength 5/5  Lumbar Paraspinal Tenderness: L-4-L-5 Lower Extremities: Full ROM and Muscle Strength 5/5 Arises from Table slowly using walker for support Narrow Based Gait   Skin:    General: Skin is warm and dry.  Neurological:     Mental Status: She is alert and oriented to person, place, and time.  Psychiatric:        Mood and Affect: Mood normal.        Behavior: Behavior normal.           Assessment & Plan:  1. Chronic Bilateral Leg pain: Paresthesia: Continue Lyrica. Continue to Monitor.11/22/2018 2. Paresthesia /Lumbar Radiculitis: Continue Lyrica.11/22/2018 3. Pain of Left Wrist/: No complaints Today:S/P Carpal Tunnel Release on 06/03/2017 by Dr. Ellene Route. Dr. Ellene Route Following.11/22/2018. 4. Fracture of superior pubic ramus.Dr. AluisioFollowing.Continue to monitor. 11/22/2018. 3. Left Knee OA: Continue Voltaren Gel.Continue to monitor. Orthopedist following.11/22/2018. 4. Polyarthralgia: Continue to alternate with heat and ice therapy. Continue current medication regime. Continue to monitor.11/22/2018. 5. Chronic Pain Syndrome:Refilled::Hydrocodone10/325 mg one tablet every 6 hours as needed for moderate pain #120.11/22/2018. 6. Lumbar Compression Fracture L2 and L3: Crystal Lee refused physical therapy at this time. Continue with rest/ heat therapy. Continue to Monitor.11/22/2018 7. Muscle  Spasm: Continue  Flexeril 5 mg at HS. Continue to monitor.   15 minutes of face to face patient care time was spent during this visit. All questions were encouraged and answered.  F/U in 1 month

## 2018-11-23 DIAGNOSIS — S32030A Wedge compression fracture of third lumbar vertebra, initial encounter for closed fracture: Secondary | ICD-10-CM | POA: Diagnosis not present

## 2018-11-24 ENCOUNTER — Other Ambulatory Visit: Payer: Self-pay

## 2018-11-24 ENCOUNTER — Telehealth (INDEPENDENT_AMBULATORY_CARE_PROVIDER_SITE_OTHER): Payer: Medicare Other | Admitting: Gastroenterology

## 2018-11-24 ENCOUNTER — Encounter: Payer: Self-pay | Admitting: Gastroenterology

## 2018-11-24 VITALS — Ht 66.0 in | Wt 232.0 lb

## 2018-11-24 DIAGNOSIS — Z86010 Personal history of colonic polyps: Secondary | ICD-10-CM

## 2018-11-24 DIAGNOSIS — K219 Gastro-esophageal reflux disease without esophagitis: Secondary | ICD-10-CM

## 2018-11-24 DIAGNOSIS — K5900 Constipation, unspecified: Secondary | ICD-10-CM | POA: Diagnosis not present

## 2018-11-24 DIAGNOSIS — R740 Nonspecific elevation of levels of transaminase and lactic acid dehydrogenase [LDH]: Secondary | ICD-10-CM | POA: Diagnosis not present

## 2018-11-24 DIAGNOSIS — R748 Abnormal levels of other serum enzymes: Secondary | ICD-10-CM | POA: Diagnosis not present

## 2018-11-24 DIAGNOSIS — R7401 Elevation of levels of liver transaminase levels: Secondary | ICD-10-CM

## 2018-11-24 MED ORDER — TRULANCE 3 MG PO TABS: 3.0000 mg | ORAL_TABLET | Freq: Every day | ORAL | 5 refills | Status: DC

## 2018-11-24 NOTE — Patient Instructions (Signed)
If you are age 83 or older, your body mass index should be between 23-30. Your Body mass index is 37.45 kg/m. If this is out of the aforementioned range listed, please consider follow up with your Primary Care Provider.  If you are age 80 or younger, your body mass index should be between 19-25. Your Body mass index is 37.45 kg/m. If this is out of the aformentioned range listed, please consider follow up with your Primary Care Provider.   To help prevent the possible spread of infection to our patients, communities, and staff; we will be implementing the following measures:  As of now we are not allowing any visitors/family members to accompany you to any upcoming appointments with [facility]. If you have any concerns about this please contact our office to discuss prior to the appointment.   We have sent the following medications to your pharmacy for you to pick up at your convenience: Trulance 3mg  daily  You have been scheduled for an MRCP at [facility] on 11/29/2018. Your appointment time is 8:00am. Please arrive 15 minutes prior to your appointment time for registration purposes. Please make certain not to have anything to eat or drink 6 hours prior to your test. In addition, if you have any metal in your body, have a pacemaker or defibrillator, please be sure to let your ordering physician know. This test typically takes 45 minutes to 1 hour to complete. Should you need to reschedule, please call [phone] to do so.  Please call our office at [phone] to set up your 3 month follow up visit.  It was a pleasure to see you today!  Vito Cirigliano, D.O.

## 2018-11-24 NOTE — Progress Notes (Signed)
Chief Complaint: Lab follow-up, GERD, constipation  GI History: 85 year old female who moved from [place], [place] to [place], [place], previously followed by [facility] at [facility] in [place] then by a few Psychiatric nurse in [place] for reflux and constipation, initially seen by me in 04/2017.  1) GERD: Index sxs of belching along with NCCP.  Has been present for over 20 years.  Has been evaluated in ER several times for these sxs with negative cardiac w/u. Had an EGD in the past (approx 2.5 years ago), but cannot recall results aside from hiatal hernia. Treated with Protonix and GI cocktail. She stopped Protonix due to lack of efficacy and treated with prn GI cocktail alone, which resolves sxs. Has also trialed omeprazole, esomeprazole, Zantac all without any improvement in sxs. No dysphagia or odynophagia. Sxs occur 2-3 times/week. Tried bland diet diet with some improvement. Worse with coffee, chocolate, spicy foods, tomato based sauces.   2) Constipation: Long standing history of constipation. Has been taking Linzess periodically for years, which she will take if sxs worsen.Otherwise, can generally maintain regular bowel habits with dietary modifications. Vit D 34 in 07/29/17.She does have a history of osteoporosis along with recent pelvic fracture. Started on vitamin D 2019.  Constipation worsened with prior calcium supplement; has since stopped.  3) Elevated ALP: Was 130 in 04/2017 in the setting of active/recent pelvic fractures.  Normal T bili.  Previous peak of 146 in 05/2015, otherwise largely in the 80s.  Repeat in 09/2018 was 156, then 121 10/2018.  5' nucleotidase elevated at 15 and elevated GGT at 123 in 10/2018.  Endoscopic history: - Colonoscopy (09/2015, [place], [place]): 2 subcentimeter tubular adenomas.  Recommended repeat in 5 years for ongoing surveillance. -EGD (03/2015, [place], [place]): Non-H. pylori gastritis, gastric polyp. - Esophageal Manometry  (2020): Normal - pH/impedance (2020): Normal with JD score of 4 and pH <4 was 0.8% (states that she avoided eating during the study due to gag reflex from probe in place)  HPI:    Due to current restrictions/limitations of in-office visits due to the COVID-19 pandemic, this scheduled clinical appointment was converted to a telehealth virtual consultation using telephone.  -Time of medical discussion: 20 minutes -The patient did consent to this virtual visit and is aware of possible charges through their insurance for this visit.  -Names of all parties present: Crystal Lee (patient), Gerrit Heck, DO, [facility] (physician) -Patient location: Home -Physician location: Office  Crystal Lee is a 85 y.o. female referred to the Gastroenterology Clinic for routine follow-up.  Was last seen by me in 2018-07-29.  At that time reflux and constipation were well controlled.  Did endorse low back pain with radiation into LLQ, with CT notable for L-spine fracture.  Did endorse fecal urgency with floating stools x2 to 3 weeks.  Normal pancreatic elastase and fecal fat.  Those symptoms have since resolved.  Today she states her constipation has worsened over the last 2-3 weeks. Was given Cipro for UTI in 09/2018. Also with Unicef in 08/2018 for UTI.  Did report 4 weeks ago had episode of n/v, which resolved within 24 hours. Has stopped taking Linzess due to n/v recently (was previously taking prn).   Unfortunately, her husband passed away in 07/29/2022.  Additionally, she has had another L spine fracture in the interim (follows with Dr. Ellene Route), then recurrent UTIs as above.  Repeat CT C/A/P demonstrated  adenopathy of the chest and upper abdomen, mostly stable from prior.  Was seen in the Hematology/oncology clinic earlier this month, with plan to repeat CT in 6 months or so.  Past medical history, past surgical history, social history, family history, medications, and allergies reviewed in the chart and with patient.     Past Medical History:  Diagnosis Date  . Arthritis   . Cancer (HCC)    HX BREAST CANCER/ SKIN CANCER  . Complication of anesthesia    N/V WITH MORPHINE  . Difficulty sleeping   . Fractured hip ([place])    LEFT - AUG 2016  . GERD (gastroesophageal reflux disease)   . Hyperlipidemia   . Hypertension   . Melanoma ([place])   . Neuropathy   . Nocturia   . Osteopenia   . 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 PONV (postoperative nausea and vomiting)    PT STATES MORPHINE CAUSED N/V  . Rosacea   . Sleep apnea   . Stage 1 breast cancer, ER+, right ([place]) 07/04/2017     Past Surgical History:  Procedure Laterality Date  . [place] STUDY N/A 06/21/2018   Procedure: [place] STUDY;  Surgeon: Lavena Bullion, DO;  Location: [facility];  Service: Gastroenterology;  Laterality: N/A;  with impedance  . ABDOMINAL HYSTERECTOMY  2010  . APPENDECTOMY  1949  . [place]  . BREAST SURGERY    . CATARACT EXTRACTION Bilateral 2011  . CHOLECYSTECTOMY    . ESOPHAGEAL MANOMETRY N/A 06/21/2018   Procedure: ESOPHAGEAL MANOMETRY (EM);  Surgeon: Lavena Bullion, DO;  [facility]: [facility];  Service: Gastroenterology;  Laterality: N/A;  . GALLBLADDER SURGERY  2015  . JOINT REPLACEMENT     RT TOTAL HIP / RT TOTAL KNEE  . MASTECTOMY  1998   BILATERAL   . [place] IMPEDANCE STUDY  06/21/2018   Procedure: [place] IMPEDANCE STUDY;  Surgeon: Lavena Bullion, DO;  Location: [facility];  Service: Gastroenterology;;  . SKIN CANCER EXCISION  2016   RT SIDE OF NOSE  . TONSILLECTOMY  1953  . TOTAL HIP ARTHROPLASTY  2010   RIGHT  . TOTAL HIP ARTHROPLASTY Left 05/09/2015   Procedure: LEFT TOTAL HIP ARTHROPLASTY ANTERIOR APPROACH;  Surgeon: Gaynelle Arabian, MD;  Location: [facility];  Service: Orthopedics;  Laterality: Left;  . TOTAL KNEE ARTHROPLASTY  2001  . TUMOR REMOVAL  2012   ABDOMINAL - NON CANCEROUS   Family History  Problem Relation Age of Onset  . Other Mother        complications  from flu  . Other Father        unsure of cause  . Colon cancer Neg Hx    Social History   Tobacco Use  . Smoking status: Former Smoker    Quit date: 02/03/1976    Years since quitting: 42.8  . Smokeless tobacco: Never Used  Substance Use Topics  . Alcohol use: No  . Drug use: No   Current Outpatient Medications  Medication Sig Dispense Refill  . Alum & Mag Hydroxide-Simeth (GI COCKTAIL) SUSP suspension Take 10 mLs by mouth 2 (two) times daily as needed for indigestion. Shake well. 900 mL 2  . AMBULATORY NON FORMULARY MEDICATION Take 10 mLs by mouth every 4 (four) hours as needed. Medication Name: Viscous Lidocaine, Bentyl, Maalox (equal parts) Take 80mL by mouth everey 4 hours as needed. 120 mL 3  . atorvastatin (LIPITOR) 20 MG tablet Take 1 tablet (20  mg total) by mouth daily. 90 tablet 1  . CALCIUM PO Take 1,200 mg by mouth daily.    . Cholecalciferol (VITAMIN D) 2000 units tablet Take 2,000 Units by mouth.    . cyclobenzaprine (FLEXERIL) 5 MG tablet Take 1 tablet (5 mg total) by mouth at bedtime as needed for muscle spasms. 30 tablet 2  . diclofenac sodium (VOLTAREN) 1 % GEL Apply 1 application topically 3 (three) times daily. To feet, ankles, and knees. 3 Tube 4  . HYDROcodone-acetaminophen (NORCO) 10-325 MG tablet Take 1 tablet by mouth every 6 (six) hours as needed. 120 tablet 0  . LINZESS 290 MCG CAPS capsule Take 1 capsule by mouth daily.    Marland Kitchen losartan (COZAAR) 25 MG tablet Take 1 tablet (25 mg total) by mouth daily. 90 tablet 1  . ondansetron (ZOFRAN-ODT) 4 MG disintegrating tablet as needed.    . pregabalin (LYRICA) 100 MG capsule Take 1 capsule (100 mg total) by mouth 3 (three) times daily. 270 capsule 1  . tobramycin-dexamethasone (TOBRADEX) ophthalmic solution tobramycin 0.3 %-dexamethasone 0.1 % eye drops,suspension  INSTILL 1 DROP INTO RIGHT EYE TWICE DAILY FOR 2 WEEKS    . traZODone (DESYREL) 50 MG tablet Take 0.5-1 tablets (25-50 mg total) by mouth at bedtime. 30  tablet 2   No current facility-administered medications for this visit.    Allergies  Allergen Reactions  . Morphine And Related Nausea And Vomiting     Review of Systems: All systems reviewed and negative except where noted in HPI.     Physical Exam:    Complete physical exam not completed due to the nature of this telehealth communication.   Gen: Awake, alert, and oriented, and well communicative. Psych: Pleasant, cooperative, normal speech, thought processing seemingly intact   ASSESSMENT AND PLAN;   #1.  GERD: Index reflux symptoms currently controlled.      #2.  Constipation: Chronic constipation which was previously controlled with dietary modifications and intermittent use of Linzess, however has since stopped the Linzess due to nausea/vomiting which time she was taking it.  -Stop Linzess -Start Trulance 3 mg daily.  Did discuss diarrhea with initial use of the medication.  Can monitor for elevations in AST/ALT (2%) -Resume high-fiber diet and increased fluid intake along with dietary modifications  #4. Elevated ALP: Suspect secondary to fractures, as she has a new L-spine fracture on recent imaging.  However given her upper abdominal adenopathy, heightened patient concerns, elevated GGT and 5' nucleotidase, she would very much like to proceed with MRCP to rule out primary biliary pathology.  Given objective data outlined above, feel this is a reasonable decision.  -MRCP  #5.  Upper abdominal adenopathy: -Stable from prior CT.  Follows with Oncology with plan for repeat CT in 6 months  #6.  History of Tubular Adenomas: Colonoscopy in 2017 notable for 2 subcentimeter tubular adenomas with recommendation repeat 5 years for ongoing surveillance.    Can consider repeat colonoscopy for ongoing surveillance in 2022.  #7.  Elevated AST/ALT: Mildly elevated AST/ALT at 62/56.  Have been intermittently elevated.  Otherwise preserved hepatic synthetic function.  MRCP  being ordered as above.  RTC in 3 months or sooner as needed  I spent over 15 minutes of time with the patient. Greater than 50% of the time was spent counseling and coordinating care.    Lavena Bullion, DO, FACG  11/24/2018, 2:04 PM   Cop[place], Gay Filler, MD

## 2018-11-28 ENCOUNTER — Other Ambulatory Visit (HOSPITAL_COMMUNITY): Payer: Self-pay | Admitting: Gastroenterology

## 2018-11-28 ENCOUNTER — Ambulatory Visit (HOSPITAL_COMMUNITY)
Admission: RE | Admit: 2018-11-28 | Discharge: 2018-11-28 | Disposition: A | Discharge status: Home / Self Care | Payer: Medicare Other | Source: Ambulatory Visit | Attending: Gastroenterology | Admitting: Gastroenterology

## 2018-11-28 ENCOUNTER — Other Ambulatory Visit: Payer: Self-pay

## 2018-11-28 DIAGNOSIS — K5900 Constipation, unspecified: Secondary | ICD-10-CM | POA: Diagnosis not present

## 2018-11-28 DIAGNOSIS — R109 Unspecified abdominal pain: Secondary | ICD-10-CM

## 2018-11-28 DIAGNOSIS — C50919 Malignant neoplasm of unspecified site of unspecified female breast: Secondary | ICD-10-CM | POA: Diagnosis not present

## 2018-11-28 DIAGNOSIS — K760 Fatty (change of) liver, not elsewhere classified: Secondary | ICD-10-CM | POA: Diagnosis not present

## 2018-11-28 DIAGNOSIS — R748 Abnormal levels of other serum enzymes: Secondary | ICD-10-CM | POA: Insufficient documentation

## 2018-11-28 DIAGNOSIS — Z86010 Personal history of colonic polyps: Secondary | ICD-10-CM | POA: Insufficient documentation

## 2018-11-28 DIAGNOSIS — K219 Gastro-esophageal reflux disease without esophagitis: Secondary | ICD-10-CM | POA: Insufficient documentation

## 2018-11-28 DIAGNOSIS — N281 Cyst of kidney, acquired: Secondary | ICD-10-CM | POA: Diagnosis not present

## 2018-11-28 MED ORDER — GADOBUTROL 1 MMOL/ML IV SOLN
10.0000 mL | Freq: Once | INTRAVENOUS | Status: AC | PRN
  Administered 2018-11-28: 10 mL via INTRAVENOUS

## 2018-11-29 ENCOUNTER — Ambulatory Visit (HOSPITAL_COMMUNITY): Admission: RE | Admit: 2018-11-29 | Payer: Medicare Other | Source: Ambulatory Visit

## 2018-12-21 ENCOUNTER — Encounter: Payer: Self-pay | Admitting: Registered Nurse

## 2018-12-21 ENCOUNTER — Encounter: Payer: Medicare Other | Attending: Physical Medicine & Rehabilitation | Admitting: Registered Nurse

## 2018-12-21 ENCOUNTER — Other Ambulatory Visit: Payer: Self-pay

## 2018-12-21 VITALS — BP 112/64 | HR 88 | Temp 97.5°F | Ht 66.0 in | Wt 231.0 lb

## 2018-12-21 DIAGNOSIS — M1712 Unilateral primary osteoarthritis, left knee: Secondary | ICD-10-CM | POA: Insufficient documentation

## 2018-12-21 DIAGNOSIS — Z8673 Personal history of transient ischemic attack (TIA), and cerebral infarction without residual deficits: Secondary | ICD-10-CM | POA: Insufficient documentation

## 2018-12-21 DIAGNOSIS — M8580 Other specified disorders of bone density and structure, unspecified site: Secondary | ICD-10-CM | POA: Insufficient documentation

## 2018-12-21 DIAGNOSIS — G8929 Other chronic pain: Secondary | ICD-10-CM | POA: Insufficient documentation

## 2018-12-21 DIAGNOSIS — K219 Gastro-esophageal reflux disease without esophagitis: Secondary | ICD-10-CM | POA: Insufficient documentation

## 2018-12-21 DIAGNOSIS — Z853 Personal history of malignant neoplasm of breast: Secondary | ICD-10-CM | POA: Diagnosis not present

## 2018-12-21 DIAGNOSIS — Z85820 Personal history of malignant melanoma of skin: Secondary | ICD-10-CM | POA: Diagnosis not present

## 2018-12-21 DIAGNOSIS — Z79899 Other long term (current) drug therapy: Secondary | ICD-10-CM | POA: Diagnosis not present

## 2018-12-21 DIAGNOSIS — I10 Essential (primary) hypertension: Secondary | ICD-10-CM | POA: Diagnosis not present

## 2018-12-21 DIAGNOSIS — M79605 Pain in left leg: Secondary | ICD-10-CM | POA: Diagnosis not present

## 2018-12-21 DIAGNOSIS — Z5181 Encounter for therapeutic drug level monitoring: Secondary | ICD-10-CM

## 2018-12-21 DIAGNOSIS — G4730 Sleep apnea, unspecified: Secondary | ICD-10-CM | POA: Diagnosis not present

## 2018-12-21 DIAGNOSIS — Z96651 Presence of right artificial knee joint: Secondary | ICD-10-CM | POA: Insufficient documentation

## 2018-12-21 DIAGNOSIS — Z9889 Other specified postprocedural states: Secondary | ICD-10-CM | POA: Diagnosis not present

## 2018-12-21 DIAGNOSIS — M7918 Myalgia, other site: Secondary | ICD-10-CM | POA: Insufficient documentation

## 2018-12-21 DIAGNOSIS — Z9049 Acquired absence of other specified parts of digestive tract: Secondary | ICD-10-CM | POA: Diagnosis not present

## 2018-12-21 DIAGNOSIS — Z96643 Presence of artificial hip joint, bilateral: Secondary | ICD-10-CM | POA: Insufficient documentation

## 2018-12-21 DIAGNOSIS — E785 Hyperlipidemia, unspecified: Secondary | ICD-10-CM | POA: Diagnosis not present

## 2018-12-21 DIAGNOSIS — M79604 Pain in right leg: Secondary | ICD-10-CM | POA: Insufficient documentation

## 2018-12-21 DIAGNOSIS — Z87891 Personal history of nicotine dependence: Secondary | ICD-10-CM | POA: Insufficient documentation

## 2018-12-21 DIAGNOSIS — Z8489 Family history of other specified conditions: Secondary | ICD-10-CM | POA: Insufficient documentation

## 2018-12-21 DIAGNOSIS — Z9013 Acquired absence of bilateral breasts and nipples: Secondary | ICD-10-CM | POA: Insufficient documentation

## 2018-12-21 MED ORDER — HYDROCODONE-ACETAMINOPHEN 10-325 MG PO TABS: 1.0000 | ORAL_TABLET | Freq: Four times a day (QID) | ORAL | 0 refills | Status: DC | PRN

## 2018-12-21 MED ORDER — CYCLOBENZAPRINE HCL 5 MG PO TABS: 5.0000 mg | ORAL_TABLET | Freq: Every evening | ORAL | 2 refills | Status: DC | PRN

## 2018-12-21 NOTE — Progress Notes (Signed)
Subjective:    Patient ID: Crystal Lee, female    DOB: 25-Aug-1933, 85 y.o.   MRN: [id number]  HPI: Crystal Lee is a 85 y.o. female who returns for follow up appointment for chronic pain and medication refill. She states her pain is located in her lower back, bilateral lower extremities and bilateral hips. She rates her pain 6. Her current exercise regime is walking.   Crystal Lee equivalent is 40.00  MME.  Last Oral Swab was performed on 06/02/2018, it was consistent.   Pain Inventory Average Pain 5 Pain Right Now 6 My pain is burning and aching  In the last 24 hours, has pain interfered with the following? General activity 7 Relation with others 0 Enjoyment of life 8 What TIME of day is your pain at its worst? evening Sleep (in general) Fair  Pain is worse with: walking and standing Pain improves with: rest, heat/ice and medication Relief from Meds: 4  Mobility use a cane use a walker ability to climb steps?  no do you drive?  yes  Function not employed: date last employed . retired I need assistance with the following:  household duties and shopping  Neuro/Psych trouble walking  Prior Studies Any changes since last visit?  no  Physicians involved in your care Any changes since last visit?  no   Family History  Problem Relation Age of Onset  . Other Mother        complications from flu  . Other Father        unsure of cause  . Colon cancer Neg Hx    Social History   Socioeconomic History  . Marital status: Married    Spouse name: Not on file  . Number of children: 3  . Years of education: 16 years  . Highest education level: Not on file  Occupational History  . Occupation: Retired  Scientific laboratory technician  . Financial resource strain: Not on file  . Food insecurity    Worry: Not on file    Inability: Not on file  . Transportation needs    Medical: Not on file    Non-medical: Not on file  Tobacco Use  . Smoking status: Former Smoker    Quit  date: 02/03/1976    Years since quitting: 42.9  . Smokeless tobacco: Never Used  Substance and Sexual Activity  . Alcohol use: No  . Drug use: No  . Sexual activity: Yes    Birth control/protection: Post-menopausal  Lifestyle  . Physical activity    Days per week: Not on file    Minutes per session: Not on file  . Stress: Not on file  Relationships  . Social Herbalist on phone: Not on file    Gets together: Not on file    Attends religious service: Not on file    Active member of club or organization: Not on file    Attends meetings of clubs or organizations: Not on file    Relationship status: Not on file  Other Topics Concern  . Not on file  Social History Narrative   Lives at home with husband.   Right-handed.   No caffeine use.   Past Surgical History:  Procedure Laterality Date  . [place] STUDY N/A 06/21/2018   Procedure: [place] STUDY;  Surgeon: Lavena Bullion, DO;  [facility];  Service: Gastroenterology;  Laterality: N/A;  with impedance  . ABDOMINAL HYSTERECTOMY  2010  . APPENDECTOMY  1949  . [place]  . BREAST SURGERY    . CATARACT EXTRACTION Bilateral 2011  . CHOLECYSTECTOMY    . ESOPHAGEAL MANOMETRY N/A 06/21/2018   Procedure: ESOPHAGEAL MANOMETRY (EM);  Surgeon: Lavena Bullion, DO;  [facility];  Service: Gastroenterology;  Laterality: N/A;  . GALLBLADDER SURGERY  2015  . JOINT REPLACEMENT     RT TOTAL HIP / RT TOTAL KNEE  . MASTECTOMY  1998   BILATERAL   . [place] IMPEDANCE STUDY  06/21/2018   Procedure: [place] IMPEDANCE STUDY;  Surgeon: Lavena Bullion, DO;  [facility];  Service: Gastroenterology;;  . SKIN CANCER EXCISION  2016   RT SIDE OF NOSE  . TONSILLECTOMY  1953  . TOTAL HIP ARTHROPLASTY  2010   RIGHT  . TOTAL HIP ARTHROPLASTY Left 05/09/2015   Procedure: LEFT TOTAL HIP ARTHROPLASTY ANTERIOR APPROACH;  Surgeon: Gaynelle Arabian, MD;  [facility]: [facility];  Service: Orthopedics;  Laterality:  Left;  . TOTAL KNEE ARTHROPLASTY  2001  . TUMOR REMOVAL  2012   ABDOMINAL - NON CANCEROUS   Past Medical History:  Diagnosis Date  . Arthritis   . Cancer (HCC)    HX BREAST CANCER/ SKIN CANCER  . Complication of anesthesia    N/V WITH Lee  . Difficulty sleeping   . Fractured hip ([place])    LEFT - AUG 2016  . GERD (gastroesophageal reflux disease)   . Hyperlipidemia   . Hypertension   . Melanoma ([place])   . Neuropathy   . Nocturia   . Osteopenia   . 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 PONV (postoperative nausea and vomiting)    PT STATES Lee CAUSED N/V  . Rosacea   . Sleep apnea   . Stage 1 breast cancer, ER+, right (HCC) 07/04/2017   BP 112/64   Pulse 88   Temp (!) 97.5 F (36.4 C)   Ht 5\' 6"  (1.676 m)   Wt 231 lb (104.8 kg)   SpO2 97%   BMI 37.28 kg/m   Opioid Risk Score:   Fall Risk Score:  `1  Depression screen PHQ 2/9  Depression screen [facility] 2/9 11/22/2018 09/28/2018 06/02/2018 04/10/2018 02/20/2018 10/17/2017  Decreased Interest 0 0 0 0 0 0  Down, Depressed, Hopeless 0 0 0 0 0 0  PHQ - 2 Score 0 0 0 0 0 0  Altered sleeping - - - - - 3  Tired, decreased energy - - - - - 3  Change in appetite - - - - - 0  Feeling bad or failure about yourself  - - - - - 0  Trouble concentrating - - - - - 0  Moving slowly or fidgety/restless - - - - - 0  Suicidal thoughts - - - - - 0  PHQ-9 Score - - - - - 6  Difficult doing work/chores - - - - - Somewhat difficult     Review of Systems  Constitutional: Negative.   HENT: Negative.   Eyes: Negative.   Respiratory: Negative.   Cardiovascular: Positive for leg swelling.  Gastrointestinal: Positive for constipation.  Endocrine: Negative.   Genitourinary: Negative.   Musculoskeletal: Positive for arthralgias, gait problem and myalgias.  Skin: Negative.   Allergic/Immunologic: Negative.   Hematological: Negative.   Psychiatric/Behavioral: Negative.   All other systems reviewed and are negative.       Objective:   Physical Exam Vitals signs and nursing note reviewed.  Constitutional:  Appearance: Normal appearance.  Neck:     Musculoskeletal: Normal range of motion.  Cardiovascular:     Rate and Rhythm: Normal rate and regular rhythm.     Pulses: Normal pulses.     Heart sounds: Normal heart sounds.  Pulmonary:     Effort: Pulmonary effort is normal.     Breath sounds: Normal breath sounds.  Musculoskeletal:     Comments: Normal Muscle Bulk and Muscle Testing Reveals:  Upper Extremities: Full ROM and Muscle Strength 5/5  Lumbar Paraspinal tenderness: L-3-L-5 Lower Extremities: Full ROM and Muscle Strength 5/5 Bilateral Lower Extremities Flexion Produces Pain into Bilateral Lower Extremities Arises from chair Slowly, using walker for support Narrow Based Gait   Skin:    General: Skin is warm and dry.  Neurological:     Mental Status: She is alert and oriented to person, place, and time.  Psychiatric:        Mood and Affect: Mood normal.        Behavior: Behavior normal.           Assessment & Plan:  1. Chronic Bilateral Leg pain: Paresthesia: Continue Lyrica. Continue to Monitor.12/21/2018 2. Paresthesia /Lumbar Radiculitis: Continue Lyrica.12/21/2018 3. Pain of Left Wrist/: No complaints Today:S/P Carpal Tunnel Release on 06/03/2017 by Dr. Ellene Route. Dr. Ellene Route Following.12/21/2018. 4. Fracture of superior pubic ramus.Dr. AluisioFollowing.Continue to monitor. 12/21/2018. 3. Left Knee OA: Continue Voltaren Gel.Continue to monitor. Orthopedist following.12/21/2018. 4. Polyarthralgia: Continue to alternate with heat and ice therapy. Continue current medication regime. Continue to monitor.12/21/2018. 5. Chronic Pain Syndrome:Refilled::Hydrocodone10/325 mg one tablet every 6 hours as needed for moderate pain #120.12/21/2018. 6. Lumbar Compression Fracture L2 and L3: Ms. Pebley refused physical therapy at this time. Continue with rest/ heat therapy.  Continue to Monitor.12/21/2018 7. Muscle Spasm: Continue  Flexeril 5 mg at HS. Continue to monitor. 12/21/2018  15 minutes of face to face patient care time was spent during this visit. All questions were encouraged and answered.  F/U in 1 month

## 2018-12-26 LAB — DRUG TOX MONITOR 1 W/CONF, ORAL FLD
Amphetamines: NEGATIVE ng/mL (ref <10)
Barbiturates: NEGATIVE ng/mL (ref <10)
Benzodiazepines: NEGATIVE ng/mL (ref <0.50)
Buprenorphine: NEGATIVE ng/mL (ref <0.10)
Cocaine: NEGATIVE ng/mL (ref <5.0)
Codeine: NEGATIVE ng/mL (ref <2.5)
Dihydrocodeine: 5.1 ng/mL — ABNORMAL HIGH (ref <2.5)
Fentanyl: NEGATIVE ng/mL (ref <0.10)
Heroin Metabolite: NEGATIVE ng/mL (ref <1.0)
Hydrocodone: 32.4 ng/mL — ABNORMAL HIGH (ref <2.5)
Hydromorphone: NEGATIVE ng/mL (ref <2.5)
MARIJUANA: NEGATIVE ng/mL (ref <2.5)
MDMA: NEGATIVE ng/mL (ref <10)
Meprobamate: NEGATIVE ng/mL (ref <2.5)
Methadone: NEGATIVE ng/mL (ref <5.0)
Morphine: NEGATIVE ng/mL (ref <2.5)
Nicotine Metabolite: NEGATIVE ng/mL (ref <5.0)
Norhydrocodone: 2.7 ng/mL — ABNORMAL HIGH (ref <2.5)
Noroxycodone: NEGATIVE ng/mL (ref <2.5)
Opiates: POSITIVE ng/mL — AB (ref <2.5)
Oxycodone: NEGATIVE ng/mL (ref <2.5)
Oxymorphone: NEGATIVE ng/mL (ref <2.5)
Phencyclidine: NEGATIVE ng/mL (ref <10)
Tapentadol: NEGATIVE ng/mL (ref <5.0)
Tramadol: NEGATIVE ng/mL (ref <5.0)
Zolpidem: NEGATIVE ng/mL (ref <5.0)

## 2018-12-26 LAB — DRUG TOX ALC METAB W/CON, ORAL FLD: Alcohol Metabolite: NEGATIVE ng/mL (ref <25)

## 2018-12-27 ENCOUNTER — Telehealth: Payer: Self-pay | Admitting: *Deleted

## 2018-12-27 NOTE — Telephone Encounter (Signed)
Oral swab drug screen was consistent for prescribed medications.  ?

## 2019-01-10 ENCOUNTER — Other Ambulatory Visit: Payer: Self-pay

## 2019-01-10 MED ORDER — ERGOCALCIFEROL 1.25 MG (50000 UT) PO CAPS: 50000.0000 [IU] | ORAL_CAPSULE | ORAL | 3 refills | Status: DC

## 2019-01-12 DIAGNOSIS — H01005 Unspecified blepharitis left lower eyelid: Secondary | ICD-10-CM | POA: Diagnosis not present

## 2019-01-12 DIAGNOSIS — H0015 Chalazion left lower eyelid: Secondary | ICD-10-CM | POA: Diagnosis not present

## 2019-01-12 DIAGNOSIS — H01002 Unspecified blepharitis right lower eyelid: Secondary | ICD-10-CM | POA: Diagnosis not present

## 2019-01-15 DIAGNOSIS — M65351 Trigger finger, right little finger: Secondary | ICD-10-CM | POA: Insufficient documentation

## 2019-01-15 DIAGNOSIS — G5601 Carpal tunnel syndrome, right upper limb: Secondary | ICD-10-CM

## 2019-01-15 DIAGNOSIS — M79641 Pain in right hand: Secondary | ICD-10-CM | POA: Diagnosis not present

## 2019-01-15 HISTORY — DX: Trigger finger, right little finger: M65.351

## 2019-01-15 HISTORY — DX: Carpal tunnel syndrome, right upper limb: G56.01

## 2019-01-19 IMAGING — MR MR CERVICAL SPINE W/O CM
4 of 5 series · 30 of 48 positions shown · non-contrast
Comparison: Cervical radiographs 12/23/2010

CLINICAL DATA: Cervical myelopathy. Numbness and pain both arms and
fingers. History of breast cancer 8477

EXAM:
MRI CERVICAL SPINE WITHOUT CONTRAST
TECHNIQUE: Multiplanar, multisequence MR imaging of the cervical spine was
performed. No intravenous contrast was administered.

[Series 3: (id) tse sag · sagittal · 3.0mm · 0.41mm/px · 6 of 13 slices shown]
[im 1/13]
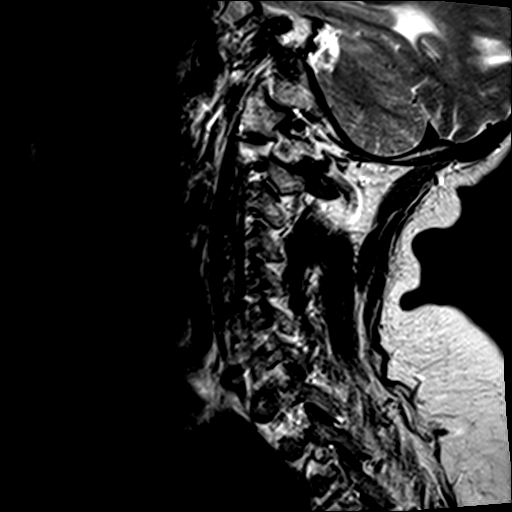
[im 3/13]
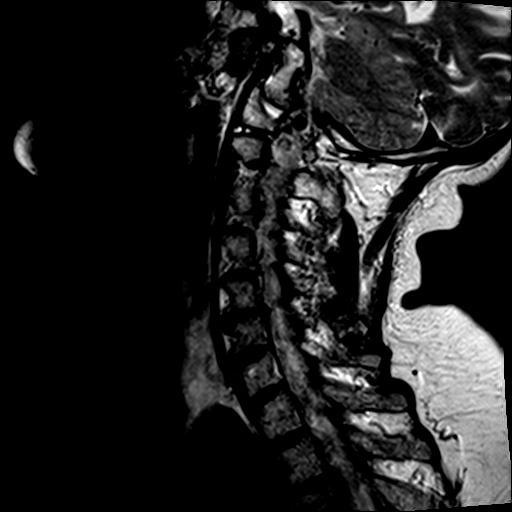
[im 5/13]
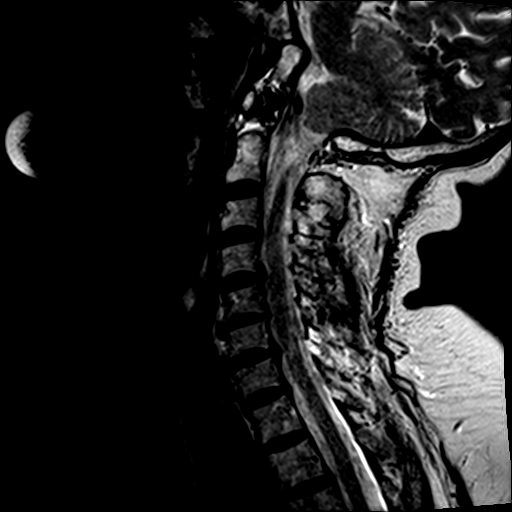
[im 8/13]
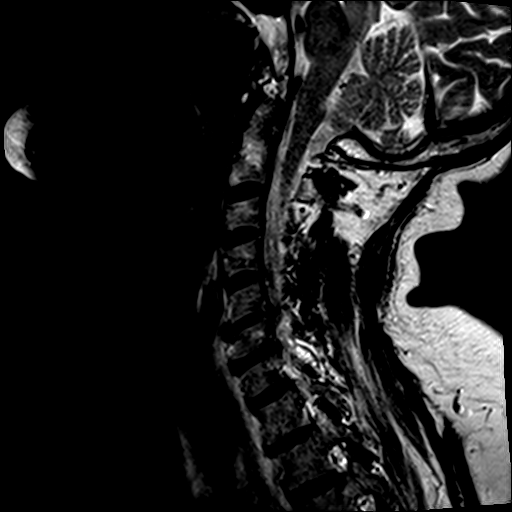
[im 10/13]
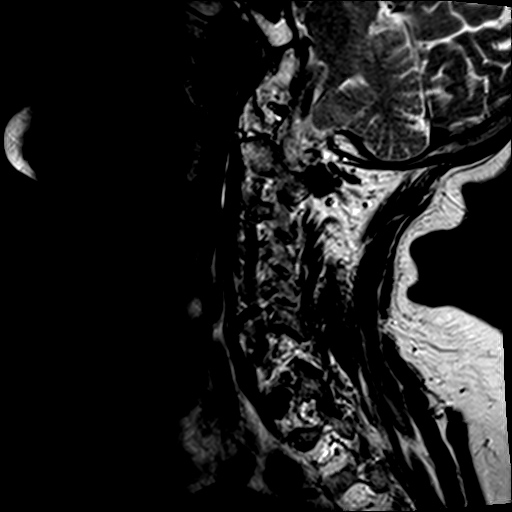
[im 13/13]
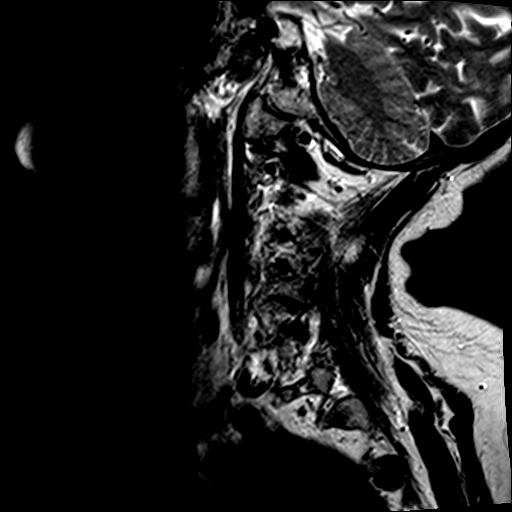

[Series 5: STIR · sagittal · 3.0mm · 0.82mm/px · 6 of 13 slices shown]
[im 1/13]
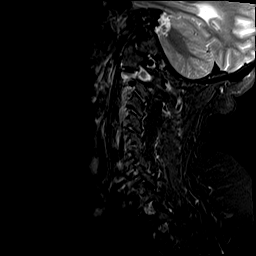
[im 3/13]
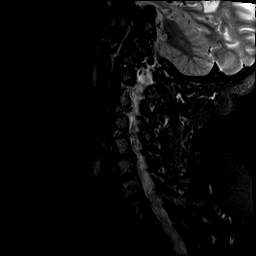
[im 5/13]
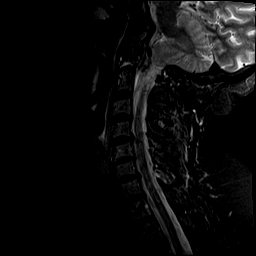
[im 8/13]
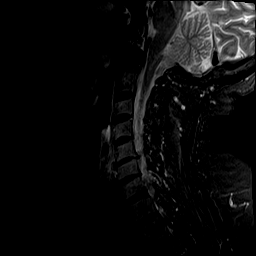
[im 10/13]
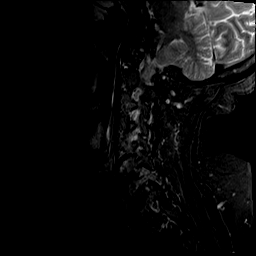
[im 13/13]
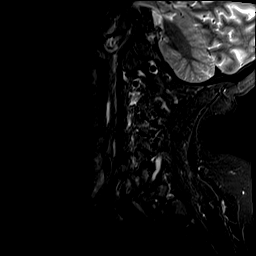

[Series 6: T2 · axial · 3.0mm · 0.39mm/px · z∈[-125,-15]mm · 9 of 34 slices shown (1 of 2)]
[im 1/34]
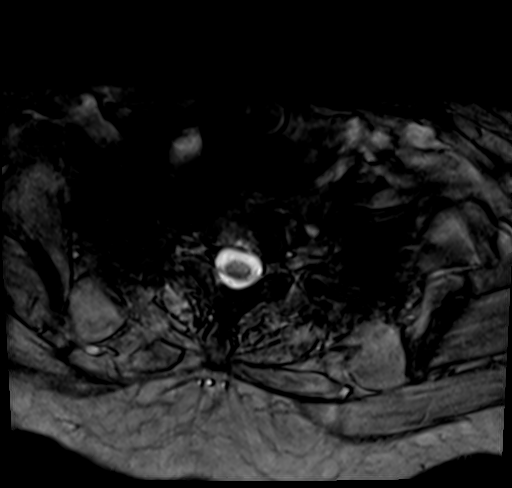
[im 5/34]
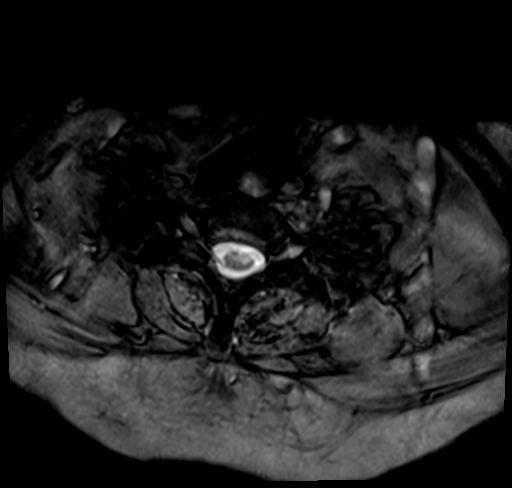
[im 10/34]
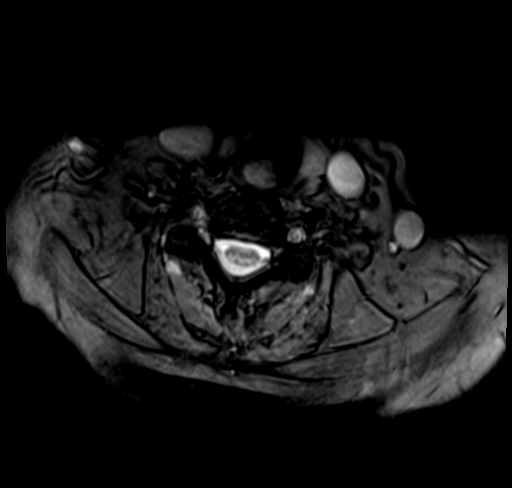
[im 15/34]
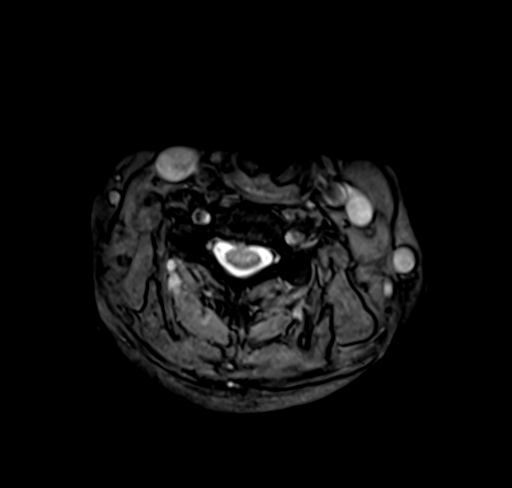
[im 17/34]
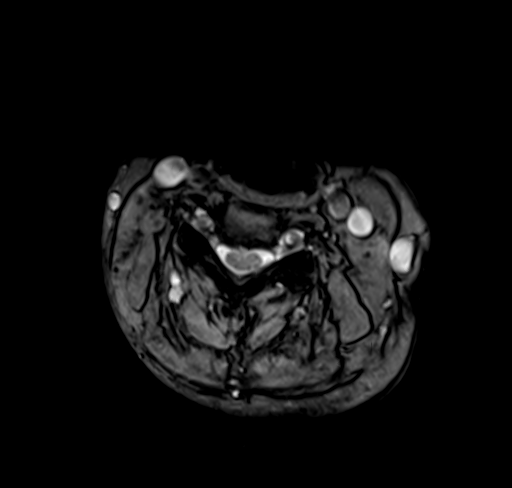
[im 19/34]
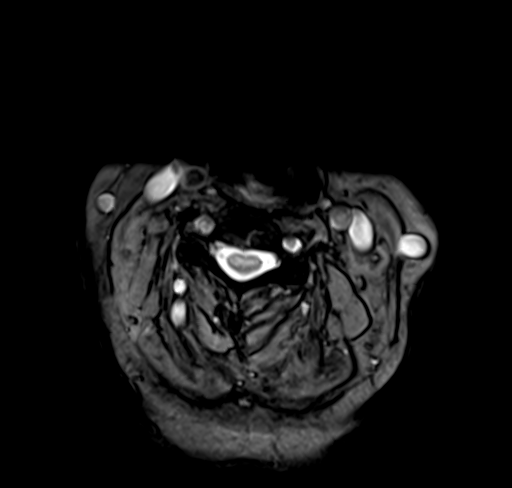
[im 24/34]
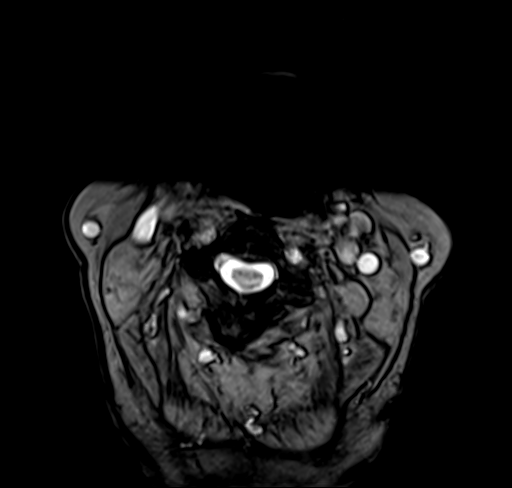
[im 29/34]
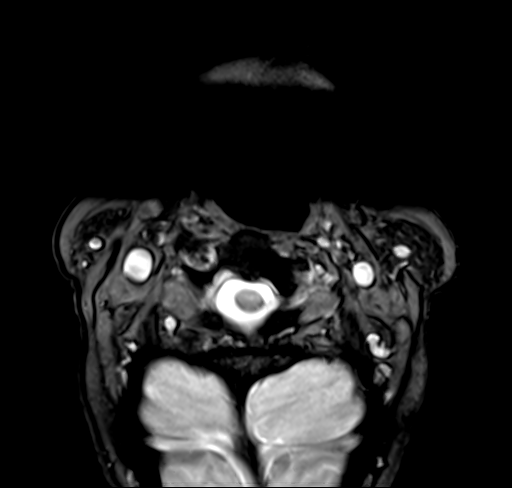
[im 34/34]
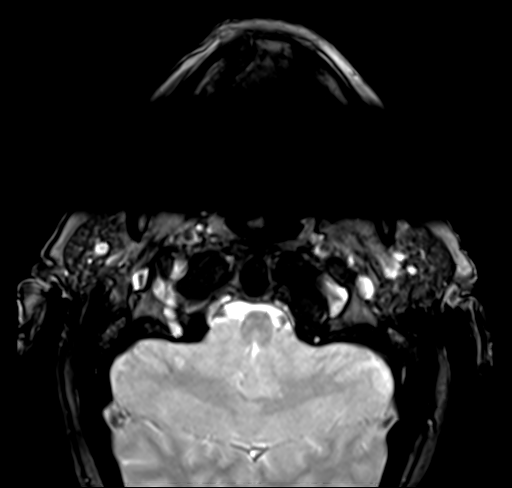

[Series 7: T2 · axial · 3.0mm · 0.78mm/px · z∈[-128,-18]mm · 9 of 34 slices shown (2 of 2)]
[im 1/34]
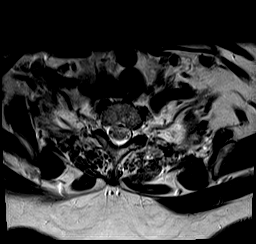
[im 5/34]
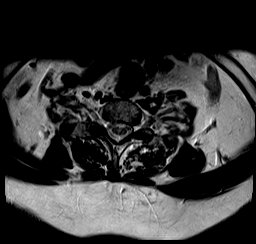
[im 10/34]
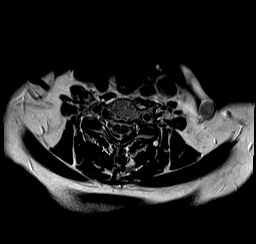
[im 15/34]
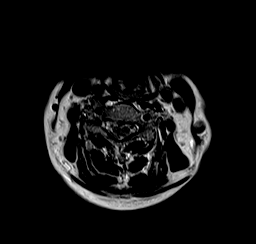
[im 17/34]
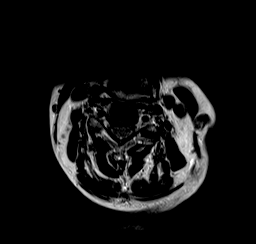
[im 19/34]
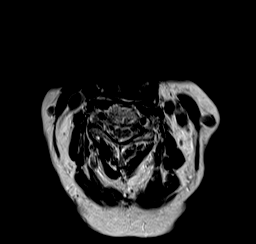
[im 24/34]
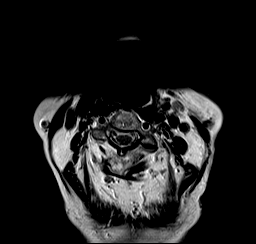
[im 29/34]
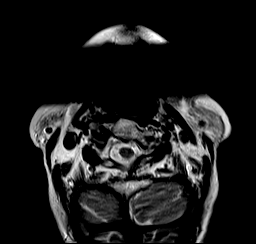
[im 34/34]
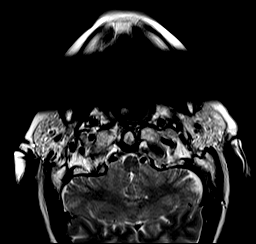

[30 of 48 positions shown; findings below may reference images not displayed]

FINDINGS: Alignment: Normal

Vertebrae: Normal bone marrow.  Negative for fracture or mass

Cord: Normal signal and morphology.  No cord compression.

Posterior Fossa, vertebral arteries, paraspinal tissues: Negative

Disc levels:

C1-2: Mild arthropathy

C2-3: Small central disc protrusion

C3-4: Small central disc protrusion

C4-5: Central disc protrusion with mild cord flattening and mild
spinal stenosis. Neural foramina patent.

C5-6: Disc degeneration and spondylosis. Moderate foraminal
narrowing bilaterally and mild spinal stenosis due to spurring.

C6-7: Small central disc protrusion without spinal or foraminal
stenosis

C7-T1: Negative
IMPRESSION: Mild spinal stenosis at C4-5 with central disc protrusion and cord
flattening

Mild spinal stenosis at C5-6 with moderate foraminal narrowing
bilaterally due to spurring

Small central disc protrusion C2-3, C3-4, C6-7

## 2019-01-22 ENCOUNTER — Other Ambulatory Visit: Payer: Self-pay | Admitting: *Deleted

## 2019-01-22 MED ORDER — LOSARTAN POTASSIUM 25 MG PO TABS: 25.0000 mg | ORAL_TABLET | Freq: Every day | ORAL | 1 refills | Status: DC

## 2019-01-22 MED ORDER — ATORVASTATIN CALCIUM 20 MG PO TABS: 20.0000 mg | ORAL_TABLET | Freq: Every day | ORAL | 1 refills | Status: DC

## 2019-01-24 ENCOUNTER — Encounter: Payer: Self-pay | Admitting: Registered Nurse

## 2019-01-24 ENCOUNTER — Encounter: Payer: Medicare Other | Attending: Physical Medicine & Rehabilitation | Admitting: Registered Nurse

## 2019-01-24 ENCOUNTER — Other Ambulatory Visit: Payer: Self-pay

## 2019-01-24 VITALS — BP 135/79 | HR 81 | Temp 98.3°F | Resp 16 | Ht 66.0 in | Wt 230.8 lb

## 2019-01-24 DIAGNOSIS — Z9049 Acquired absence of other specified parts of digestive tract: Secondary | ICD-10-CM | POA: Insufficient documentation

## 2019-01-24 DIAGNOSIS — I10 Essential (primary) hypertension: Secondary | ICD-10-CM | POA: Diagnosis not present

## 2019-01-24 DIAGNOSIS — M79604 Pain in right leg: Secondary | ICD-10-CM | POA: Diagnosis not present

## 2019-01-24 DIAGNOSIS — G894 Chronic pain syndrome: Secondary | ICD-10-CM

## 2019-01-24 DIAGNOSIS — Z8673 Personal history of transient ischemic attack (TIA), and cerebral infarction without residual deficits: Secondary | ICD-10-CM | POA: Diagnosis not present

## 2019-01-24 DIAGNOSIS — Z79899 Other long term (current) drug therapy: Secondary | ICD-10-CM | POA: Diagnosis not present

## 2019-01-24 DIAGNOSIS — R202 Paresthesia of skin: Secondary | ICD-10-CM | POA: Diagnosis not present

## 2019-01-24 DIAGNOSIS — S32000S Wedge compression fracture of unspecified lumbar vertebra, sequela: Secondary | ICD-10-CM

## 2019-01-24 DIAGNOSIS — M8580 Other specified disorders of bone density and structure, unspecified site: Secondary | ICD-10-CM | POA: Diagnosis not present

## 2019-01-24 DIAGNOSIS — Z9013 Acquired absence of bilateral breasts and nipples: Secondary | ICD-10-CM | POA: Diagnosis not present

## 2019-01-24 DIAGNOSIS — M961 Postlaminectomy syndrome, not elsewhere classified: Secondary | ICD-10-CM | POA: Diagnosis not present

## 2019-01-24 DIAGNOSIS — G4730 Sleep apnea, unspecified: Secondary | ICD-10-CM | POA: Insufficient documentation

## 2019-01-24 DIAGNOSIS — M7918 Myalgia, other site: Secondary | ICD-10-CM | POA: Insufficient documentation

## 2019-01-24 DIAGNOSIS — Z96651 Presence of right artificial knee joint: Secondary | ICD-10-CM

## 2019-01-24 DIAGNOSIS — M1712 Unilateral primary osteoarthritis, left knee: Secondary | ICD-10-CM

## 2019-01-24 DIAGNOSIS — Z85820 Personal history of malignant melanoma of skin: Secondary | ICD-10-CM | POA: Diagnosis not present

## 2019-01-24 DIAGNOSIS — Z9889 Other specified postprocedural states: Secondary | ICD-10-CM | POA: Insufficient documentation

## 2019-01-24 DIAGNOSIS — E785 Hyperlipidemia, unspecified: Secondary | ICD-10-CM | POA: Diagnosis not present

## 2019-01-24 DIAGNOSIS — Z8489 Family history of other specified conditions: Secondary | ICD-10-CM | POA: Insufficient documentation

## 2019-01-24 DIAGNOSIS — Z853 Personal history of malignant neoplasm of breast: Secondary | ICD-10-CM | POA: Insufficient documentation

## 2019-01-24 DIAGNOSIS — K219 Gastro-esophageal reflux disease without esophagitis: Secondary | ICD-10-CM | POA: Diagnosis not present

## 2019-01-24 DIAGNOSIS — G8929 Other chronic pain: Secondary | ICD-10-CM | POA: Diagnosis not present

## 2019-01-24 DIAGNOSIS — M79605 Pain in left leg: Secondary | ICD-10-CM | POA: Insufficient documentation

## 2019-01-24 DIAGNOSIS — Z87891 Personal history of nicotine dependence: Secondary | ICD-10-CM | POA: Diagnosis not present

## 2019-01-24 DIAGNOSIS — Z96643 Presence of artificial hip joint, bilateral: Secondary | ICD-10-CM | POA: Insufficient documentation

## 2019-01-24 DIAGNOSIS — Z5181 Encounter for therapeutic drug level monitoring: Secondary | ICD-10-CM

## 2019-01-24 DIAGNOSIS — M2550 Pain in unspecified joint: Secondary | ICD-10-CM

## 2019-01-24 MED ORDER — HYDROCODONE-ACETAMINOPHEN 10-325 MG PO TABS: 1.0000 | ORAL_TABLET | Freq: Four times a day (QID) | ORAL | 0 refills | Status: DC | PRN

## 2019-01-24 NOTE — Progress Notes (Signed)
Subjective:    Patient ID: Crystal Lee, female    DOB: 06-May-1934, 85 y.o.   MRN: [id number]  HPI: Crystal Lee is a 85 y.o. female who returns for follow up appointment for chronic pain and medication refill. She states her pain is located in her lower back, bilateral lower extremities and bilateral knees. She rates her  pain 6. Her  current exercise regime is walking.   Crystal Lee Morphine equivalent is 40.00  MME.  Last Oral Swab was Performed on 12/21/2018, it was consistent.   Pain Inventory Average Pain 8 Pain Right Now 6 My pain is burning, tingling and aching  In the last 24 hours, has pain interfered with the following? General activity 6 Relation with others 6 Enjoyment of life 6 What TIME of day is your pain at its worst? evening Sleep (in general) Fair  Pain is worse with: walking, bending, standing and some activites Pain improves with: rest, heat/ice and medication Relief from Meds: 4  Mobility use a cane use a walker ability to climb steps?  no do you drive?  yes  Function retired I need assistance with the following:  dressing, household duties and shopping  Neuro/Psych trouble walking  Prior Studies Any changes since last visit?  no  Physicians involved in your care Any changes since last visit?  no   Family History  Problem Relation Age of Onset  . Other Mother        complications from flu  . Other Father        unsure of cause  . Colon cancer Neg Hx    Social History   Socioeconomic History  . Marital status: Married    Spouse name: Not on file  . Number of children: 3  . Years of education: 16 years  . Highest education level: Not on file  Occupational History  . Occupation: Retired  Scientific laboratory technician  . Financial resource strain: Not on file  . Food insecurity    Worry: Not on file    Inability: Not on file  . Transportation needs    Medical: Not on file    Non-medical: Not on file  Tobacco Use  . Smoking status: Former  Smoker    Quit date: 02/03/1976    Years since quitting: 43.0  . Smokeless tobacco: Never Used  Substance and Sexual Activity  . Alcohol use: No  . Drug use: No  . Sexual activity: Yes    Birth control/protection: Post-menopausal  Lifestyle  . Physical activity    Days per week: Not on file    Minutes per session: Not on file  . Stress: Not on file  Relationships  . Social Herbalist on phone: Not on file    Gets together: Not on file    Attends religious service: Not on file    Active member of club or organization: Not on file    Attends meetings of clubs or organizations: Not on file    Relationship status: Not on file  Other Topics Concern  . Not on file  Social History Narrative   Lives at home with husband.   Right-handed.   No caffeine use.   Past Surgical History:  Procedure Laterality Date  . [place] STUDY N/A 06/21/2018   Procedure: [place] STUDY;  Surgeon: Lavena Bullion, DO;  [facility];  Service: Gastroenterology;  Laterality: N/A;  with impedance  . ABDOMINAL HYSTERECTOMY  2010  .  APPENDECTOMY  1949  . [place]  . BREAST SURGERY    . CATARACT EXTRACTION Bilateral 2011  . CHOLECYSTECTOMY    . ESOPHAGEAL MANOMETRY N/A 06/21/2018   Procedure: ESOPHAGEAL MANOMETRY (EM);  Surgeon: Lavena Bullion, DO;  [facility];  Service: Gastroenterology;  Laterality: N/A;  . GALLBLADDER SURGERY  2015  . JOINT REPLACEMENT     RT TOTAL HIP / RT TOTAL KNEE  . MASTECTOMY  1998   BILATERAL   . [place] IMPEDANCE STUDY  06/21/2018   Procedure: [place] IMPEDANCE STUDY;  Surgeon: Lavena Bullion, DO;  [facility];  Service: Gastroenterology;;  . SKIN CANCER EXCISION  2016   RT SIDE OF NOSE  . TONSILLECTOMY  1953  . TOTAL HIP ARTHROPLASTY  2010   RIGHT  . TOTAL HIP ARTHROPLASTY Left 05/09/2015   Procedure: LEFT TOTAL HIP ARTHROPLASTY ANTERIOR APPROACH;  Surgeon: Gaynelle Arabian, MD;  [facility];  Service: Orthopedics;   Laterality: Left;  . TOTAL KNEE ARTHROPLASTY  2001  . TUMOR REMOVAL  2012   ABDOMINAL - NON CANCEROUS   Past Medical History:  Diagnosis Date  . Arthritis   . Cancer (HCC)    HX BREAST CANCER/ SKIN CANCER  . Complication of anesthesia    N/V WITH MORPHINE  . Difficulty sleeping   . Fractured hip ([place])    LEFT - AUG 2016  . GERD (gastroesophageal reflux disease)   . Hyperlipidemia   . Hypertension   . Melanoma ([place])   . Neuropathy   . Nocturia   . Osteopenia   . 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 PONV (postoperative nausea and vomiting)    PT STATES MORPHINE CAUSED N/V  . Rosacea   . Sleep apnea   . Stage 1 breast cancer, ER+, right ([place]) 07/04/2017   There were no vitals taken for this visit.  Opioid Risk Score:   Fall Risk Score:  `1  Depression screen PHQ 2/9  Depression screen [facility] 2/9 11/22/2018 09/28/2018 06/02/2018 04/10/2018 02/20/2018 10/17/2017  Decreased Interest 0 0 0 0 0 0  Down, Depressed, Hopeless 0 0 0 0 0 0  PHQ - 2 Score 0 0 0 0 0 0  Altered sleeping - - - - - 3  Tired, decreased energy - - - - - 3  Change in appetite - - - - - 0  Feeling bad or failure about yourself  - - - - - 0  Trouble concentrating - - - - - 0  Moving slowly or fidgety/restless - - - - - 0  Suicidal thoughts - - - - - 0  PHQ-9 Score - - - - - 6  Difficult doing work/chores - - - - - Somewhat difficult     Review of Systems  Constitutional: Negative.   HENT: Negative.   Eyes: Negative.   Respiratory: Negative.   Cardiovascular: Positive for leg swelling.  Gastrointestinal: Positive for constipation.  Endocrine: Negative.   Genitourinary: Negative.   Musculoskeletal: Positive for back pain, gait problem and myalgias.  Skin: Negative.   Allergic/Immunologic: Negative.   Hematological: Negative.   Psychiatric/Behavioral: Negative.   All other systems reviewed and are negative.      Objective:   Physical Exam Vitals signs and nursing note reviewed.   Constitutional:      Appearance: Normal appearance.  Neck:     Musculoskeletal: Normal range of motion and neck supple.  Cardiovascular:     Rate and Rhythm:  Normal rate and regular rhythm.     Pulses: Normal pulses.     Heart sounds: Normal heart sounds.  Pulmonary:     Effort: Pulmonary effort is normal.     Breath sounds: Normal breath sounds.  Musculoskeletal:     Comments: Normal Muscle Bulk and Muscle Testing Reveals:  Upper Extremities: Full  ROM and Muscle Strength 5/5 Lumbar Paraspinal Tenderness: L-3-L-5 Lower Extremities: Full ROM and Muscle Strength 5/5 Bilateral Lower Extremities Flexion Produces Pain into Bilateral Patella's Arises from chair slowly using walker for support Narrow Based Gait   Skin:    General: Skin is warm and dry.  Neurological:     Mental Status: She is alert and oriented to person, place, and time.  Psychiatric:        Mood and Affect: Mood normal.        Behavior: Behavior normal.           Assessment & Plan:  1. Chronic Bilateral Leg pain: Paresthesia: Continue Lyrica. Continue to Monitor.01/24/2019 2.Paresthesia /Lumbar Radiculitis: Continue Lyrica.01/24/2019 3. Pain of Left Wrist/: No complaints Today:S/P Carpal Tunnel Release on 06/03/2017 by Dr. Ellene Route. Dr. Ellene Route Following.01/24/2019. 4. Fracture of superior pubic ramus.Dr. AluisioFollowing.Continue to monitor. 01/24/2019. 3. Bilateral Knee OA: Continue Voltaren Gel.Continue to monitor. Orthopedist following.01/24/2019. 4. Polyarthralgia: Continue to alternate with heat and ice therapy. Continue current medication regime. Continue to monitor.01/24/2019. 5. Chronic Pain Syndrome:Refilled::Hydrocodone10/325 mg one tablet every 6 hours as needed for moderate pain #120.01/24/2019. 6. Lumbar Compression Fracture L2 and L3: Crystal Lee refused physical therapy at this time.Continue with rest/ heat therapy. Continue to Monitor.01/24/2019 7. Muscle Spasm:  ContinueFlexeril 5 mg at HS. Continue to monitor. 01/24/2019  56minutes of face to face patient care time was spent during this visit. All questions were encouraged and answered.  F/U in 1 month

## 2019-01-29 DIAGNOSIS — H524 Presbyopia: Secondary | ICD-10-CM | POA: Diagnosis not present

## 2019-01-29 DIAGNOSIS — Z961 Presence of intraocular lens: Secondary | ICD-10-CM | POA: Diagnosis not present

## 2019-02-12 DIAGNOSIS — M79641 Pain in right hand: Secondary | ICD-10-CM | POA: Diagnosis not present

## 2019-02-12 DIAGNOSIS — M65351 Trigger finger, right little finger: Secondary | ICD-10-CM | POA: Diagnosis not present

## 2019-02-20 DIAGNOSIS — N3020 Other chronic cystitis without hematuria: Secondary | ICD-10-CM | POA: Diagnosis not present

## 2019-02-22 ENCOUNTER — Encounter: Payer: Medicare Other | Attending: Physical Medicine & Rehabilitation | Admitting: Registered Nurse

## 2019-02-22 ENCOUNTER — Other Ambulatory Visit: Payer: Self-pay

## 2019-02-22 ENCOUNTER — Encounter: Payer: Self-pay | Admitting: Registered Nurse

## 2019-02-22 VITALS — BP 148/81 | HR 71 | Temp 97.5°F | Ht 66.0 in | Wt 228.0 lb

## 2019-02-22 DIAGNOSIS — Z8489 Family history of other specified conditions: Secondary | ICD-10-CM | POA: Diagnosis not present

## 2019-02-22 DIAGNOSIS — G8929 Other chronic pain: Secondary | ICD-10-CM | POA: Diagnosis not present

## 2019-02-22 DIAGNOSIS — Z87891 Personal history of nicotine dependence: Secondary | ICD-10-CM | POA: Diagnosis not present

## 2019-02-22 DIAGNOSIS — M2550 Pain in unspecified joint: Secondary | ICD-10-CM

## 2019-02-22 DIAGNOSIS — Z9013 Acquired absence of bilateral breasts and nipples: Secondary | ICD-10-CM | POA: Diagnosis not present

## 2019-02-22 DIAGNOSIS — Z96651 Presence of right artificial knee joint: Secondary | ICD-10-CM | POA: Diagnosis not present

## 2019-02-22 DIAGNOSIS — G4730 Sleep apnea, unspecified: Secondary | ICD-10-CM | POA: Diagnosis not present

## 2019-02-22 DIAGNOSIS — R202 Paresthesia of skin: Secondary | ICD-10-CM

## 2019-02-22 DIAGNOSIS — Z9889 Other specified postprocedural states: Secondary | ICD-10-CM | POA: Diagnosis not present

## 2019-02-22 DIAGNOSIS — Z96643 Presence of artificial hip joint, bilateral: Secondary | ICD-10-CM | POA: Diagnosis not present

## 2019-02-22 DIAGNOSIS — M79604 Pain in right leg: Secondary | ICD-10-CM | POA: Diagnosis not present

## 2019-02-22 DIAGNOSIS — Z8673 Personal history of transient ischemic attack (TIA), and cerebral infarction without residual deficits: Secondary | ICD-10-CM | POA: Diagnosis not present

## 2019-02-22 DIAGNOSIS — E785 Hyperlipidemia, unspecified: Secondary | ICD-10-CM | POA: Diagnosis not present

## 2019-02-22 DIAGNOSIS — Z79899 Other long term (current) drug therapy: Secondary | ICD-10-CM

## 2019-02-22 DIAGNOSIS — I10 Essential (primary) hypertension: Secondary | ICD-10-CM | POA: Diagnosis not present

## 2019-02-22 DIAGNOSIS — Z853 Personal history of malignant neoplasm of breast: Secondary | ICD-10-CM | POA: Insufficient documentation

## 2019-02-22 DIAGNOSIS — Z9049 Acquired absence of other specified parts of digestive tract: Secondary | ICD-10-CM | POA: Insufficient documentation

## 2019-02-22 DIAGNOSIS — K219 Gastro-esophageal reflux disease without esophagitis: Secondary | ICD-10-CM | POA: Diagnosis not present

## 2019-02-22 DIAGNOSIS — M8580 Other specified disorders of bone density and structure, unspecified site: Secondary | ICD-10-CM | POA: Diagnosis not present

## 2019-02-22 DIAGNOSIS — G894 Chronic pain syndrome: Secondary | ICD-10-CM

## 2019-02-22 DIAGNOSIS — M7918 Myalgia, other site: Secondary | ICD-10-CM | POA: Diagnosis not present

## 2019-02-22 DIAGNOSIS — Z5181 Encounter for therapeutic drug level monitoring: Secondary | ICD-10-CM | POA: Diagnosis not present

## 2019-02-22 DIAGNOSIS — S32000S Wedge compression fracture of unspecified lumbar vertebra, sequela: Secondary | ICD-10-CM

## 2019-02-22 DIAGNOSIS — Z85820 Personal history of malignant melanoma of skin: Secondary | ICD-10-CM | POA: Insufficient documentation

## 2019-02-22 DIAGNOSIS — M961 Postlaminectomy syndrome, not elsewhere classified: Secondary | ICD-10-CM | POA: Diagnosis not present

## 2019-02-22 DIAGNOSIS — M1712 Unilateral primary osteoarthritis, left knee: Secondary | ICD-10-CM | POA: Diagnosis not present

## 2019-02-22 DIAGNOSIS — M79605 Pain in left leg: Secondary | ICD-10-CM | POA: Diagnosis not present

## 2019-02-22 MED ORDER — HYDROCODONE-ACETAMINOPHEN 10-325 MG PO TABS: 1.0000 | ORAL_TABLET | Freq: Four times a day (QID) | ORAL | 0 refills | Status: DC | PRN

## 2019-02-22 NOTE — Progress Notes (Signed)
Subjective:    Patient ID: Crystal Lee, female    DOB: [birth year], 85 y.o.   MRN: [id number]  HPI: Crystal Lee is a 85 y.o. female who returns for follow up appointment for chronic pain and medication refill. She states her pain is located in her lower back, bilateral lower extremities with tingling and burning, left knee and generalized joint pain. She rates her pain 6.  current exercise regime is walking and performing stretching exercises.  Ms. Marotte Morphine equivalent is 40.00 MME.  Last Oral Swab was Performed on 12/21/2018, it was consistent.   Pain Inventory Average Pain 6 Pain Right Now 6 My pain is sharp, burning and tingling  In the last 24 hours, has pain interfered with the following? General activity 7 Relation with others 2 Enjoyment of life 5 What TIME of day is your pain at its worst? evening, night Sleep (in general) Poor  Pain is worse with: walking, bending, standing and some activites Pain improves with: rest, heat/ice and medication Relief from Meds: 4  Mobility walk with assistance use a cane use a walker ability to climb steps?  no do you drive?  yes Do you have any goals in this area?  yes  Function retired I need assistance with the following:  household duties and shopping  Neuro/Psych bowel control problems trouble walking  Prior Studies Any changes since last visit?  no  Physicians involved in your care Any changes since last visit?  no   Family History  Problem Relation Age of Onset  . Other Mother        complications from flu  . Other Father        unsure of cause  . Colon cancer Neg Hx    Social History   Socioeconomic History  . Marital status: Married    Spouse name: Not on file  . Number of children: 3  . Years of education: 16 years  . Highest education level: Not on file  Occupational History  . Occupation: Retired  Scientific laboratory technician  . Financial resource strain: Not on file  . Food insecurity    Worry: Not on  file    Inability: Not on file  . Transportation needs    Medical: Not on file    Non-medical: Not on file  Tobacco Use  . Smoking status: Former Smoker    Quit date: 02/03/1976    Years since quitting: 43.0  . Smokeless tobacco: Never Used  Substance and Sexual Activity  . Alcohol use: No  . Drug use: No  . Sexual activity: Yes    Birth control/protection: Post-menopausal  Lifestyle  . Physical activity    Days per week: Not on file    Minutes per session: Not on file  . Stress: Not on file  Relationships  . Social Herbalist on phone: Not on file    Gets together: Not on file    Attends religious service: Not on file    Active member of club or organization: Not on file    Attends meetings of clubs or organizations: Not on file    Relationship status: Not on file  Other Topics Concern  . Not on file  Social History Narrative   Lives at home with husband.   Right-handed.   No caffeine use.   Past Surgical History:  Procedure Laterality Date  . [place] STUDY N/A 06/21/2018   Procedure: [place] STUDY;  Surgeon: Gerrit Heck  V, DO;  [facility];  Service: Gastroenterology;  Laterality: N/A;  with impedance  . ABDOMINAL HYSTERECTOMY  2010  . APPENDECTOMY  1949  . [place]  . BREAST SURGERY    . CATARACT EXTRACTION Bilateral 2011  . CHOLECYSTECTOMY    . ESOPHAGEAL MANOMETRY N/A 06/21/2018   Procedure: ESOPHAGEAL MANOMETRY (EM);  Surgeon: Lavena Bullion, DO;  [facility];  Service: Gastroenterology;  Laterality: N/A;  . GALLBLADDER SURGERY  2015  . JOINT REPLACEMENT     RT TOTAL HIP / RT TOTAL KNEE  . MASTECTOMY  1998   BILATERAL   . [place] IMPEDANCE STUDY  06/21/2018   Procedure: [place] IMPEDANCE STUDY;  Surgeon: Lavena Bullion, DO;  [facility];  Service: Gastroenterology;;  . SKIN CANCER EXCISION  2016   RT SIDE OF NOSE  . TONSILLECTOMY  1953  . TOTAL HIP ARTHROPLASTY  2010   RIGHT  . TOTAL HIP  ARTHROPLASTY Left 05/09/2015   Procedure: LEFT TOTAL HIP ARTHROPLASTY ANTERIOR APPROACH;  Surgeon: Gaynelle Arabian, MD;  [facility];  Service: Orthopedics;  Laterality: Left;  . TOTAL KNEE ARTHROPLASTY  2001  . TUMOR REMOVAL  2012   ABDOMINAL - NON CANCEROUS   Past Medical History:  Diagnosis Date  . Arthritis   . Cancer (HCC)    HX BREAST CANCER/ SKIN CANCER  . Complication of anesthesia    N/V WITH MORPHINE  . Difficulty sleeping   . Fractured hip ([place])    LEFT - AUG 2016  . GERD (gastroesophageal reflux disease)   . Hyperlipidemia   . Hypertension   . Melanoma ([place])   . Neuropathy   . Nocturia   . Osteopenia   . 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 PONV (postoperative nausea and vomiting)    PT STATES MORPHINE CAUSED N/V  . Rosacea   . Sleep apnea   . Stage 1 breast cancer, ER+, right (HCC) 07/04/2017   BP (!) 148/81   Pulse 71   Temp (!) 97.5 F (36.4 C)   Ht 5\' 6"  (1.676 m)   Wt 228 lb (103.4 kg)   SpO2 94%   BMI 36.80 kg/m   Opioid Risk Score:   Fall Risk Score:  `1  Depression screen PHQ 2/9  Depression screen [facility] 2/9 11/22/2018 09/28/2018 06/02/2018 04/10/2018 02/20/2018 10/17/2017  Decreased Interest 0 0 0 0 0 0  Down, Depressed, Hopeless 0 0 0 0 0 0  PHQ - 2 Score 0 0 0 0 0 0  Altered sleeping - - - - - 3  Tired, decreased energy - - - - - 3  Change in appetite - - - - - 0  Feeling bad or failure about yourself  - - - - - 0  Trouble concentrating - - - - - 0  Moving slowly or fidgety/restless - - - - - 0  Suicidal thoughts - - - - - 0  PHQ-9 Score - - - - - 6  Difficult doing work/chores - - - - - Somewhat difficult     Review of Systems  Constitutional: Negative.   HENT: Negative.   Eyes: Negative.   Respiratory: Negative.   Cardiovascular: Positive for leg swelling.  Gastrointestinal: Positive for constipation.  Endocrine: Negative.   Genitourinary: Positive for difficulty urinating.  Musculoskeletal: Positive for arthralgias,  back pain and gait problem.  Skin: Negative.   Allergic/Immunologic: Negative.   Neurological:       Tingling  Psychiatric/Behavioral: Negative.   All other systems reviewed and are negative.      Objective:   Physical Exam Vitals signs and nursing note reviewed.  Constitutional:      Appearance: Normal appearance.  Neck:     Musculoskeletal: Normal range of motion and neck supple.  Cardiovascular:     Rate and Rhythm: Normal rate and regular rhythm.     Pulses: Normal pulses.     Heart sounds: Normal heart sounds.  Pulmonary:     Effort: Pulmonary effort is normal.     Breath sounds: Normal breath sounds.  Musculoskeletal:     Comments: Normal Muscle Bulk and Muscle Testing Reveals:  Upper Extremities: Full ROM and Muscle Strength 5/5  Lumbar Paraspinal Tenderness: L-3-L-5 Lower Extremities: Full ROM and Muscle Strength 5/5 Arises from chair slowly using walker for support Antalgic Gait   Skin:    General: Skin is warm and dry.  Neurological:     Mental Status: She is alert and oriented to person, place, and time.  Psychiatric:        Mood and Affect: Mood normal.        Behavior: Behavior normal.           Assessment & Plan:  1. Chronic Bilateral Leg pain: Paresthesia: Continue Lyrica. Continue to Monitor.02/22/2019 2.Paresthesia Doreene Burke Radiculitis: Ms. Kubek states she has weaned herself off the Lyrica due to daytime drowsiness. We will continue to monitor.02/22/2019 3. Pain of Left Wrist/: No complaints Today:S/P Carpal Tunnel Release on 06/03/2017 by Dr. Ellene Route. Dr. Ellene Route Following.02/22/2019. 4. Fracture of superior pubic ramus.Dr. AluisioFollowing.Continue to monitor. 02/22/2019. 3. Bilateral Knee OA: Continue Voltaren Gel.Continue to monitor. Orthopedist following.02/22/2019. 4. Polyarthralgia: Continue to alternate with heat and ice therapy. Continue current medication regime. Continue to monitor.02/22/2019. 5. Chronic Pain  Syndrome:Refilled::Hydrocodone10/325 mg one tablet every 6 hours as needed for moderate pain #120.02/22/2019. 6. Lumbar Compression Fracture L2 and L3: Ms. Piske refused physical therapy at this time.Continue with rest/ heat therapy. Continue to Monitor.02/22/2019 7. Muscle Spasm: ContinueFlexeril 5 mg at HS. Continue to monitor.02/22/2019  76minutes of face to face patient care time was spent during this visit. All questions were encouraged and answered.  F/U in 1 month

## 2019-03-22 ENCOUNTER — Other Ambulatory Visit: Payer: Self-pay

## 2019-03-22 ENCOUNTER — Encounter: Payer: Self-pay | Admitting: Registered Nurse

## 2019-03-22 ENCOUNTER — Encounter: Payer: Medicare Other | Attending: Physical Medicine & Rehabilitation | Admitting: Registered Nurse

## 2019-03-22 VITALS — BP 121/77 | HR 85 | Temp 97.5°F | Ht 66.0 in | Wt 228.0 lb

## 2019-03-22 DIAGNOSIS — K219 Gastro-esophageal reflux disease without esophagitis: Secondary | ICD-10-CM | POA: Insufficient documentation

## 2019-03-22 DIAGNOSIS — M79605 Pain in left leg: Secondary | ICD-10-CM | POA: Insufficient documentation

## 2019-03-22 DIAGNOSIS — Z5181 Encounter for therapeutic drug level monitoring: Secondary | ICD-10-CM | POA: Diagnosis not present

## 2019-03-22 DIAGNOSIS — M62838 Other muscle spasm: Secondary | ICD-10-CM

## 2019-03-22 DIAGNOSIS — G894 Chronic pain syndrome: Secondary | ICD-10-CM | POA: Diagnosis not present

## 2019-03-22 DIAGNOSIS — E785 Hyperlipidemia, unspecified: Secondary | ICD-10-CM | POA: Insufficient documentation

## 2019-03-22 DIAGNOSIS — G4730 Sleep apnea, unspecified: Secondary | ICD-10-CM | POA: Insufficient documentation

## 2019-03-22 DIAGNOSIS — Z79899 Other long term (current) drug therapy: Secondary | ICD-10-CM

## 2019-03-22 DIAGNOSIS — Z9013 Acquired absence of bilateral breasts and nipples: Secondary | ICD-10-CM | POA: Diagnosis not present

## 2019-03-22 DIAGNOSIS — Z85820 Personal history of malignant melanoma of skin: Secondary | ICD-10-CM | POA: Insufficient documentation

## 2019-03-22 DIAGNOSIS — Z8673 Personal history of transient ischemic attack (TIA), and cerebral infarction without residual deficits: Secondary | ICD-10-CM | POA: Insufficient documentation

## 2019-03-22 DIAGNOSIS — G8929 Other chronic pain: Secondary | ICD-10-CM | POA: Insufficient documentation

## 2019-03-22 DIAGNOSIS — I10 Essential (primary) hypertension: Secondary | ICD-10-CM | POA: Insufficient documentation

## 2019-03-22 DIAGNOSIS — M79604 Pain in right leg: Secondary | ICD-10-CM | POA: Diagnosis not present

## 2019-03-22 DIAGNOSIS — Z9049 Acquired absence of other specified parts of digestive tract: Secondary | ICD-10-CM | POA: Insufficient documentation

## 2019-03-22 DIAGNOSIS — Z96643 Presence of artificial hip joint, bilateral: Secondary | ICD-10-CM | POA: Diagnosis not present

## 2019-03-22 DIAGNOSIS — M8580 Other specified disorders of bone density and structure, unspecified site: Secondary | ICD-10-CM | POA: Diagnosis not present

## 2019-03-22 DIAGNOSIS — Z87891 Personal history of nicotine dependence: Secondary | ICD-10-CM | POA: Diagnosis not present

## 2019-03-22 DIAGNOSIS — M1712 Unilateral primary osteoarthritis, left knee: Secondary | ICD-10-CM

## 2019-03-22 DIAGNOSIS — R202 Paresthesia of skin: Secondary | ICD-10-CM

## 2019-03-22 DIAGNOSIS — Z96651 Presence of right artificial knee joint: Secondary | ICD-10-CM | POA: Diagnosis not present

## 2019-03-22 DIAGNOSIS — M7918 Myalgia, other site: Secondary | ICD-10-CM | POA: Insufficient documentation

## 2019-03-22 DIAGNOSIS — Z9889 Other specified postprocedural states: Secondary | ICD-10-CM | POA: Insufficient documentation

## 2019-03-22 DIAGNOSIS — Z8489 Family history of other specified conditions: Secondary | ICD-10-CM | POA: Insufficient documentation

## 2019-03-22 DIAGNOSIS — Z853 Personal history of malignant neoplasm of breast: Secondary | ICD-10-CM | POA: Diagnosis not present

## 2019-03-22 DIAGNOSIS — M961 Postlaminectomy syndrome, not elsewhere classified: Secondary | ICD-10-CM

## 2019-03-22 DIAGNOSIS — S32000S Wedge compression fracture of unspecified lumbar vertebra, sequela: Secondary | ICD-10-CM

## 2019-03-22 MED ORDER — HYDROCODONE-ACETAMINOPHEN 10-325 MG PO TABS: 1.0000 | ORAL_TABLET | Freq: Four times a day (QID) | ORAL | 0 refills | Status: DC | PRN

## 2019-03-22 NOTE — Progress Notes (Signed)
Subjective:    Patient ID: Crystal Lee, female    DOB: 02/03/1934, 85 y.o.   MRN: [id number]  HPI: Crystal Lee is a 85 y.o. female who returns for follow up appointment for chronic pain and medication refill. She states her pain is located in her lower back pain, bilateral lower extremities with tingling and numbness and bilateral knee pain. She rates her pain 6. Her current exercise regime is walking.  Ms. Zarlengo Morphine equivalent is 40.00  MME.  Last Oral Swab was Performed on 12/21/2018, it was consistent.   Pain Inventory Average Pain 6 Pain Right Now 6 My pain is sharp, burning and tingling  In the last 24 hours, has pain interfered with the following? General activity 7 Relation with others 2 Enjoyment of life 5 What TIME of day is your pain at its worst? daytime Sleep (in general) Poor  Pain is worse with: walking, bending and standing Pain improves with: rest, heat/ice and medication Relief from Meds: 4  Mobility walk with assistance use a cane use a walker ability to climb steps?  no do you drive?  yes  Function retired I need assistance with the following:  household duties and shopping  Neuro/Psych bowel control problems tingling trouble walking  Prior Studies Any changes since last visit?  no  Physicians involved in your care Any changes since last visit?  no   Family History  Problem Relation Age of Onset  . Other Mother        complications from flu  . Other Father        unsure of cause  . Colon cancer Neg Hx    Social History   Socioeconomic History  . Marital status: Married    Spouse name: Not on file  . Number of children: 3  . Years of education: 16 years  . Highest education level: Not on file  Occupational History  . Occupation: Retired  Scientific laboratory technician  . Financial resource strain: Not on file  . Food insecurity    Worry: Not on file    Inability: Not on file  . Transportation needs    Medical: Not on file   Non-medical: Not on file  Tobacco Use  . Smoking status: Former Smoker    Quit date: 02/03/1976    Years since quitting: 43.1  . Smokeless tobacco: Never Used  Substance and Sexual Activity  . Alcohol use: No  . Drug use: No  . Sexual activity: Yes    Birth control/protection: Post-menopausal  Lifestyle  . Physical activity    Days per week: Not on file    Minutes per session: Not on file  . Stress: Not on file  Relationships  . Social Herbalist on phone: Not on file    Gets together: Not on file    Attends religious service: Not on file    Active member of club or organization: Not on file    Attends meetings of clubs or organizations: Not on file    Relationship status: Not on file  Other Topics Concern  . Not on file  Social History Narrative   Lives at home with husband.   Right-handed.   No caffeine use.   Past Surgical History:  Procedure Laterality Date  . [place] STUDY N/A 06/21/2018   Procedure: [place] STUDY;  Surgeon: Lavena Bullion, DO;  [facility];  Service: Gastroenterology;  Laterality: N/A;  with impedance  . ABDOMINAL  HYSTERECTOMY  2010  . APPENDECTOMY  1949  . [place]  . BREAST SURGERY    . CATARACT EXTRACTION Bilateral 2011  . CHOLECYSTECTOMY    . ESOPHAGEAL MANOMETRY N/A 06/21/2018   Procedure: ESOPHAGEAL MANOMETRY (EM);  Surgeon: Lavena Bullion, DO;  [facility];  Service: Gastroenterology;  Laterality: N/A;  . GALLBLADDER SURGERY  2015  . JOINT REPLACEMENT     RT TOTAL HIP / RT TOTAL KNEE  . MASTECTOMY  1998   BILATERAL   . [place] IMPEDANCE STUDY  06/21/2018   Procedure: [place] IMPEDANCE STUDY;  Surgeon: Lavena Bullion, DO;  [facility];  Service: Gastroenterology;;  . SKIN CANCER EXCISION  2016   RT SIDE OF NOSE  . TONSILLECTOMY  1953  . TOTAL HIP ARTHROPLASTY  2010   RIGHT  . TOTAL HIP ARTHROPLASTY Left 05/09/2015   Procedure: LEFT TOTAL HIP ARTHROPLASTY ANTERIOR APPROACH;   Surgeon: Gaynelle Arabian, MD;  [facility];  Service: Orthopedics;  Laterality: Left;  . TOTAL KNEE ARTHROPLASTY  2001  . TUMOR REMOVAL  2012   ABDOMINAL - NON CANCEROUS   Past Medical History:  Diagnosis Date  . Arthritis   . Cancer (HCC)    HX BREAST CANCER/ SKIN CANCER  . Complication of anesthesia    N/V WITH MORPHINE  . Difficulty sleeping   . Fractured hip ([place])    LEFT - AUG 2016  . GERD (gastroesophageal reflux disease)   . Hyperlipidemia   . Hypertension   . Melanoma ([place])   . Neuropathy   . Nocturia   . Osteopenia   . 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 PONV (postoperative nausea and vomiting)    PT STATES MORPHINE CAUSED N/V  . Rosacea   . Sleep apnea   . Stage 1 breast cancer, ER+, right (HCC) 07/04/2017   BP 121/77   Pulse 85   Temp (!) 97.5 F (36.4 C)   Ht 5\' 6"  (1.676 m)   Wt 228 lb (103.4 kg)   SpO2 95%   BMI 36.80 kg/m   Opioid Risk Score:   Fall Risk Score:  `1  Depression screen PHQ 2/9  Depression screen [facility] 2/9 11/22/2018 09/28/2018 06/02/2018 04/10/2018 02/20/2018 10/17/2017  Decreased Interest 0 0 0 0 0 0  Down, Depressed, Hopeless 0 0 0 0 0 0  PHQ - 2 Score 0 0 0 0 0 0  Altered sleeping - - - - - 3  Tired, decreased energy - - - - - 3  Change in appetite - - - - - 0  Feeling bad or failure about yourself  - - - - - 0  Trouble concentrating - - - - - 0  Moving slowly or fidgety/restless - - - - - 0  Suicidal thoughts - - - - - 0  PHQ-9 Score - - - - - 6  Difficult doing work/chores - - - - - Somewhat difficult     Review of Systems  Constitutional: Negative.   HENT: Negative.   Eyes: Negative.   Respiratory: Negative.   Cardiovascular: Positive for leg swelling.  Gastrointestinal: Positive for constipation.  Endocrine: Negative.   Genitourinary: Positive for difficulty urinating.  Musculoskeletal: Positive for arthralgias, back pain and gait problem.  Skin: Negative.   Allergic/Immunologic: Negative.    Neurological: Positive for numbness.  Hematological: Negative.   Psychiatric/Behavioral: Negative.   All other systems reviewed and are negative.      Objective:  Physical Exam Vitals signs and nursing note reviewed.  Constitutional:      Appearance: Normal appearance.  Neck:     Musculoskeletal: Normal range of motion and neck supple.  Cardiovascular:     Rate and Rhythm: Normal rate and regular rhythm.     Pulses: Normal pulses.     Heart sounds: Normal heart sounds.  Pulmonary:     Effort: Pulmonary effort is normal.     Breath sounds: Normal breath sounds.  Musculoskeletal:     Comments: Normal Muscle Bulk and Muscle Testing Reveals:  Upper Extremities: Full ROM and Muscle Strength 5/5  Lumbar Paraspinal Tenderness : L-4- L-5  Lower Extremities: Full ROM and Muscle Strength 5/5 Arises from chair with ease Narrow Based  Gait   Skin:    General: Skin is warm and dry.  Neurological:     Mental Status: She is alert and oriented to person, place, and time.  Psychiatric:        Mood and Affect: Mood normal.        Behavior: Behavior normal.           Assessment & Plan:  1. Chronic Bilateral Leg pain: Paresthesia: Continue Lyrica. Continue to Monitor.03/22/2019 2.Paresthesia Doreene Burke Radiculitis: Ms. Wolden states she has weaned herself off the Lyrica due to daytime drowsiness. We will continue to monitor.03/22/2019 3. Pain of Left Wrist/: No complaints Today:S/P Carpal Tunnel Release on 06/03/2017 by Dr. Ellene Route. Dr. Ellene Route Following.03/22/2019. 4. Fracture of superior pubic ramus.Dr. AluisioFollowing.Continue to monitor. 03/22/2019. 3.BilateralKnee OA: Continue Voltaren Gel.Continue to monitor. Orthopedist following.03/22/2019. 4. Polyarthralgia: Continue to alternate with heat and ice therapy. Continue current medication regime. Continue to monitor.03/22/2019. 5. Chronic Pain Syndrome:Refilled::Hydrocodone10/325 mg one tablet every 6 hours as needed  for moderate pain #120.03/22/2019. 6. Lumbar Compression Fracture L2 and L3: Ms. Cooner refused physical therapy at this time.Continue with rest/ heat therapy. Continue to Monitor.03/22/2019 7. Muscle Spasm: ContinueFlexeril 5 mg at HS. Continue to monitor.03/22/2019  60minutes of face to face patient care time was spent during this visit. All questions were encouraged and answered.  F/U in 1 month

## 2019-03-28 DIAGNOSIS — M654 Radial styloid tenosynovitis [de Quervain]: Secondary | ICD-10-CM | POA: Diagnosis not present

## 2019-03-28 DIAGNOSIS — M25531 Pain in right wrist: Secondary | ICD-10-CM

## 2019-03-28 DIAGNOSIS — M659 Synovitis and tenosynovitis, unspecified: Secondary | ICD-10-CM | POA: Insufficient documentation

## 2019-03-28 DIAGNOSIS — M65939 Unspecified synovitis and tenosynovitis, unspecified forearm: Secondary | ICD-10-CM | POA: Insufficient documentation

## 2019-03-28 HISTORY — DX: Pain in right wrist: M25.531

## 2019-03-28 HISTORY — DX: Unspecified synovitis and tenosynovitis, unspecified forearm: M65.939

## 2019-04-18 ENCOUNTER — Encounter: Payer: Self-pay | Admitting: Registered Nurse

## 2019-04-18 ENCOUNTER — Encounter: Payer: Medicare Other | Attending: Physical Medicine & Rehabilitation | Admitting: Registered Nurse

## 2019-04-18 ENCOUNTER — Other Ambulatory Visit: Payer: Self-pay

## 2019-04-18 VITALS — BP 131/80 | HR 81 | Temp 97.8°F | Ht 66.0 in | Wt 228.0 lb

## 2019-04-18 DIAGNOSIS — Z87891 Personal history of nicotine dependence: Secondary | ICD-10-CM | POA: Diagnosis not present

## 2019-04-18 DIAGNOSIS — Z96651 Presence of right artificial knee joint: Secondary | ICD-10-CM | POA: Diagnosis not present

## 2019-04-18 DIAGNOSIS — M62838 Other muscle spasm: Secondary | ICD-10-CM | POA: Diagnosis not present

## 2019-04-18 DIAGNOSIS — Z85820 Personal history of malignant melanoma of skin: Secondary | ICD-10-CM | POA: Diagnosis not present

## 2019-04-18 DIAGNOSIS — M7918 Myalgia, other site: Secondary | ICD-10-CM | POA: Insufficient documentation

## 2019-04-18 DIAGNOSIS — G894 Chronic pain syndrome: Secondary | ICD-10-CM

## 2019-04-18 DIAGNOSIS — Z9889 Other specified postprocedural states: Secondary | ICD-10-CM | POA: Diagnosis not present

## 2019-04-18 DIAGNOSIS — Z79899 Other long term (current) drug therapy: Secondary | ICD-10-CM | POA: Diagnosis not present

## 2019-04-18 DIAGNOSIS — M79605 Pain in left leg: Secondary | ICD-10-CM | POA: Insufficient documentation

## 2019-04-18 DIAGNOSIS — S32000S Wedge compression fracture of unspecified lumbar vertebra, sequela: Secondary | ICD-10-CM | POA: Diagnosis not present

## 2019-04-18 DIAGNOSIS — Z9013 Acquired absence of bilateral breasts and nipples: Secondary | ICD-10-CM | POA: Insufficient documentation

## 2019-04-18 DIAGNOSIS — G4730 Sleep apnea, unspecified: Secondary | ICD-10-CM | POA: Diagnosis not present

## 2019-04-18 DIAGNOSIS — Z8673 Personal history of transient ischemic attack (TIA), and cerebral infarction without residual deficits: Secondary | ICD-10-CM | POA: Insufficient documentation

## 2019-04-18 DIAGNOSIS — M961 Postlaminectomy syndrome, not elsewhere classified: Secondary | ICD-10-CM | POA: Diagnosis not present

## 2019-04-18 DIAGNOSIS — K219 Gastro-esophageal reflux disease without esophagitis: Secondary | ICD-10-CM | POA: Insufficient documentation

## 2019-04-18 DIAGNOSIS — R202 Paresthesia of skin: Secondary | ICD-10-CM

## 2019-04-18 DIAGNOSIS — Z853 Personal history of malignant neoplasm of breast: Secondary | ICD-10-CM | POA: Insufficient documentation

## 2019-04-18 DIAGNOSIS — M1712 Unilateral primary osteoarthritis, left knee: Secondary | ICD-10-CM | POA: Diagnosis not present

## 2019-04-18 DIAGNOSIS — M79604 Pain in right leg: Secondary | ICD-10-CM | POA: Diagnosis not present

## 2019-04-18 DIAGNOSIS — Z5181 Encounter for therapeutic drug level monitoring: Secondary | ICD-10-CM

## 2019-04-18 DIAGNOSIS — G8929 Other chronic pain: Secondary | ICD-10-CM | POA: Diagnosis not present

## 2019-04-18 DIAGNOSIS — E785 Hyperlipidemia, unspecified: Secondary | ICD-10-CM | POA: Diagnosis not present

## 2019-04-18 DIAGNOSIS — Z8489 Family history of other specified conditions: Secondary | ICD-10-CM | POA: Diagnosis not present

## 2019-04-18 DIAGNOSIS — Z9049 Acquired absence of other specified parts of digestive tract: Secondary | ICD-10-CM | POA: Insufficient documentation

## 2019-04-18 DIAGNOSIS — Z96643 Presence of artificial hip joint, bilateral: Secondary | ICD-10-CM | POA: Diagnosis not present

## 2019-04-18 DIAGNOSIS — M2550 Pain in unspecified joint: Secondary | ICD-10-CM

## 2019-04-18 DIAGNOSIS — I10 Essential (primary) hypertension: Secondary | ICD-10-CM | POA: Diagnosis not present

## 2019-04-18 DIAGNOSIS — M8580 Other specified disorders of bone density and structure, unspecified site: Secondary | ICD-10-CM | POA: Insufficient documentation

## 2019-04-18 MED ORDER — HYDROCODONE-ACETAMINOPHEN 10-325 MG PO TABS: 1.0000 | ORAL_TABLET | Freq: Four times a day (QID) | ORAL | 0 refills | Status: DC | PRN

## 2019-04-18 NOTE — Progress Notes (Signed)
Subjective:    Patient ID: Crystal Lee, female    DOB: 1933/06/13, 85 y.o.   MRN: [id number]  HPI: Crystal Lee is a 85 y.o. female who returns for follow up appointment for chronic pain and medication refill. She states her pain is located in her lower back and bilateral lower extremities with tingling and burning. She rates her  Pain 6. Her current exercise regime is walking with walker.   Mr. Hawkins Morphine equivalent is 40.00  MME.  Oral Swab was Performed today.   Pain Inventory Average Pain 6 Pain Right Now 6 My pain is sharp, burning, tingling and aching  In the last 24 hours, has pain interfered with the following? General activity 7 Relation with others 3 Enjoyment of life 5 What TIME of day is your pain at its worst? evening Sleep (in general) Poor  Pain is worse with: walking, bending, standing and some activites Pain improves with: rest, heat/ice, medication and TENS Relief from Meds: 4  Mobility walk with assistance use a cane use a walker ability to climb steps?  no do you drive?  yes  Function retired I need assistance with the following:  household duties and shopping  Neuro/Psych tingling trouble walking  Prior Studies Any changes since last visit?  no  Physicians involved in your care Any changes since last visit?  no   Family History  Problem Relation Age of Onset  . Other Mother        complications from flu  . Other Father        unsure of cause  . Colon cancer Neg Hx    Social History   Socioeconomic History  . Marital status: Married    Spouse name: Not on file  . Number of children: 3  . Years of education: 16 years  . Highest education level: Not on file  Occupational History  . Occupation: Retired  Scientific laboratory technician  . Financial resource strain: Not on file  . Food insecurity    Worry: Not on file    Inability: Not on file  . Transportation needs    Medical: Not on file    Non-medical: Not on file  Tobacco Use  .  Smoking status: Former Smoker    Quit date: 02/03/1976    Years since quitting: 43.2  . Smokeless tobacco: Never Used  Substance and Sexual Activity  . Alcohol use: No  . Drug use: No  . Sexual activity: Yes    Birth control/protection: Post-menopausal  Lifestyle  . Physical activity    Days per week: Not on file    Minutes per session: Not on file  . Stress: Not on file  Relationships  . Social Herbalist on phone: Not on file    Gets together: Not on file    Attends religious service: Not on file    Active member of club or organization: Not on file    Attends meetings of clubs or organizations: Not on file    Relationship status: Not on file  Other Topics Concern  . Not on file  Social History Narrative   Lives at home with husband.   Right-handed.   No caffeine use.   Past Surgical History:  Procedure Laterality Date  . [place] STUDY N/A 06/21/2018   Procedure: [place] STUDY;  Surgeon: Lavena Bullion, DO;  [facility];  Service: Gastroenterology;  Laterality: N/A;  with impedance  . ABDOMINAL HYSTERECTOMY  2010  . APPENDECTOMY  1949  . [place]  . BREAST SURGERY    . CATARACT EXTRACTION Bilateral 2011  . CHOLECYSTECTOMY    . ESOPHAGEAL MANOMETRY N/A 06/21/2018   Procedure: ESOPHAGEAL MANOMETRY (EM);  Surgeon: Lavena Bullion, DO;  [facility];  Service: Gastroenterology;  Laterality: N/A;  . GALLBLADDER SURGERY  2015  . JOINT REPLACEMENT     RT TOTAL HIP / RT TOTAL KNEE  . MASTECTOMY  1998   BILATERAL   . [place] IMPEDANCE STUDY  06/21/2018   Procedure: [place] IMPEDANCE STUDY;  Surgeon: Lavena Bullion, DO;  [facility];  Service: Gastroenterology;;  . SKIN CANCER EXCISION  2016   RT SIDE OF NOSE  . TONSILLECTOMY  1953  . TOTAL HIP ARTHROPLASTY  2010   RIGHT  . TOTAL HIP ARTHROPLASTY Left 05/09/2015   Procedure: LEFT TOTAL HIP ARTHROPLASTY ANTERIOR APPROACH;  Surgeon: Gaynelle Arabian, MD;  Location: [facility];   Service: Orthopedics;  Laterality: Left;  . TOTAL KNEE ARTHROPLASTY  2001  . TUMOR REMOVAL  2012   ABDOMINAL - NON CANCEROUS   Past Medical History:  Diagnosis Date  . Arthritis   . Cancer (HCC)    HX BREAST CANCER/ SKIN CANCER  . Complication of anesthesia    N/V WITH MORPHINE  . Difficulty sleeping   . Fractured hip ([place])    LEFT - AUG 2016  . GERD (gastroesophageal reflux disease)   . Hyperlipidemia   . Hypertension   . Melanoma ([place])   . Neuropathy   . Nocturia   . Osteopenia   . 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 PONV (postoperative nausea and vomiting)    PT STATES MORPHINE CAUSED N/V  . Rosacea   . Sleep apnea   . Stage 1 breast cancer, ER+, right ([place]) 07/04/2017   There were no vitals taken for this visit.  Opioid Risk Score:   Fall Risk Score:  `1  Depression screen PHQ 2/9  Depression screen [facility] 2/9 11/22/2018 09/28/2018 06/02/2018 04/10/2018 02/20/2018 10/17/2017  Decreased Interest 0 0 0 0 0 0  Down, Depressed, Hopeless 0 0 0 0 0 0  PHQ - 2 Score 0 0 0 0 0 0  Altered sleeping - - - - - 3  Tired, decreased energy - - - - - 3  Change in appetite - - - - - 0  Feeling bad or failure about yourself  - - - - - 0  Trouble concentrating - - - - - 0  Moving slowly or fidgety/restless - - - - - 0  Suicidal thoughts - - - - - 0  PHQ-9 Score - - - - - 6  Difficult doing work/chores - - - - - Somewhat difficult     Review of Systems  Constitutional: Negative.   HENT: Negative.   Eyes: Negative.   Respiratory: Negative.   Cardiovascular: Positive for leg swelling.  Gastrointestinal: Positive for constipation.  Endocrine: Negative.   Genitourinary: Positive for difficulty urinating.  Musculoskeletal: Positive for arthralgias, back pain and gait problem.  Skin: Negative.   Allergic/Immunologic: Negative.   Neurological: Positive for numbness.  Hematological: Negative.   Psychiatric/Behavioral: Negative.   All other systems reviewed and are  negative.      Objective:   Physical Exam Vitals signs and nursing note reviewed.  Constitutional:      Appearance: Normal appearance.  Neck:     Musculoskeletal: Normal range of motion and neck  supple.  Cardiovascular:     Rate and Rhythm: Normal rate and regular rhythm.     Pulses: Normal pulses.     Heart sounds: Normal heart sounds.  Pulmonary:     Effort: Pulmonary effort is normal.     Breath sounds: Normal breath sounds.  Musculoskeletal:     Comments: Normal Muscle Bulk and Muscle Testing Reveals:  Upper Extremities: Full ROM and Muscle Strength 5/5 Lumbar Paraspinal Tenderness: L-3-L-5 Lower Extremities: Full ROM and Muscle Strength 5/5 Right Lower Extremity Flexion Produces Pain into her Right popliteal fossa Left: Lower Extremity Flexion Produces Pain into Left Patella  Arises from chair slowly using walker for support Narrow Based   Gait   Skin:    General: Skin is warm and dry.  Neurological:     Mental Status: She is alert and oriented to person, place, and time.  Psychiatric:        Mood and Affect: Mood normal.        Behavior: Behavior normal.           Assessment & Plan:  1. Chronic Bilateral Leg pain: Paresthesia: Continue Lyrica. Continue to Monitor.04/18/2019 2.Paresthesia /Lumbar Radiculitis:Ms. Keckler states she has weaned herself off the Lyrica due to daytime drowsiness. We will continue to monitor.04/18/2019 3. Pain of Left Wrist/: No complaints Today:S/P Carpal Tunnel Release on 06/03/2017 by Dr. Ellene Route. Dr. Ellene Route Following.04/18/2019. 4. Fracture of superior pubic ramus.Dr. AluisioFollowing.Continue to monitor. 04/18/2019. 3.BilateralKnee OA: Continue Voltaren Gel.Continue to monitor. Orthopedist following.04/18/2019. 4. Polyarthralgia: Continue to alternate with heat and ice therapy. Continue current medication regime. Continue to monitor.04/18/2019. 5. Chronic Pain Syndrome:Refilled::Hydrocodone10/325 mg one tablet every  6 hours as needed for moderate pain #120.04/18/2019. 6. Lumbar Compression Fracture L2 and L3: Ms. Doughten refused physical therapy at this time.Continue with rest/ heat therapy. Continue to Monitor.04/18/2019 7. Muscle Spasm: ContinueFlexeril 5 mg at HS. Continue to monitor.04/18/2019  30minutes of face to face patient care time was spent during this visit. All questions were encouraged and answered.  F/U in 1 month

## 2019-04-20 ENCOUNTER — Other Ambulatory Visit: Payer: Self-pay

## 2019-04-20 ENCOUNTER — Ambulatory Visit (INDEPENDENT_AMBULATORY_CARE_PROVIDER_SITE_OTHER): Payer: Medicare Other | Admitting: Family Medicine

## 2019-04-20 ENCOUNTER — Other Ambulatory Visit (INDEPENDENT_AMBULATORY_CARE_PROVIDER_SITE_OTHER): Payer: Medicare Other

## 2019-04-20 DIAGNOSIS — N390 Urinary tract infection, site not specified: Secondary | ICD-10-CM

## 2019-04-20 HISTORY — DX: Urinary tract infection, site not specified: N39.0

## 2019-04-20 LAB — URINALYSIS, ROUTINE W REFLEX MICROSCOPIC
Bilirubin Urine: NEGATIVE
Ketones, ur: NEGATIVE
Nitrite: POSITIVE — AB
Specific Gravity, Urine: 1.01 (ref 1.000–1.030)
Total Protein, Urine: 30 — AB
Urine Glucose: NEGATIVE
Urobilinogen, UA: 2 — AB (ref 0.0–1.0)
pH: 6 (ref 5.0–8.0)

## 2019-04-20 MED ORDER — CIPROFLOXACIN HCL 250 MG PO TABS: 250.0000 mg | ORAL_TABLET | Freq: Two times a day (BID) | ORAL | 0 refills | Status: DC

## 2019-04-20 MED ORDER — PHENAZOPYRIDINE HCL 200 MG PO TABS: 200.0000 mg | ORAL_TABLET | Freq: Three times a day (TID) | ORAL | 1 refills | Status: DC | PRN

## 2019-04-20 NOTE — Assessment & Plan Note (Signed)
She has a history of recurrent UTI and these symptoms are the same as she always gets with urinary frequency, foul smelling and cloudy urine and abdominal pain. She reports good response to Ciprofloxacin in the past. Is started on Cipro 250 mg po bid, Pyridium 200 mg tid prn and probiotics, encouraged to increase fluid intake and she agrees to bring in a urine sample

## 2019-04-20 NOTE — Progress Notes (Signed)
Virtual Visit via phonw Note  I connected with [name] on 04/20/19 at  9:00 AM EST by a phone enabled telemedicine application and verified that I am speaking with the correct person using two identifiers.  Location: Patient: home Provider: home   I discussed the limitations of evaluation and management by telemedicine and the availability of in person appointments. The patient expressed understanding and agreed to proceed. Magdalene Molly, CMA was able to get the patient set up on a phone visit after being unable to set up a video visit   Subjective:    Patient ID: Crystal Lee, female    DOB: 05/21/1934, 85 y.o.   MRN: [id number]  No chief complaint on file.   HPI Patient is in today for evaluation of urinary tract symptoms she has developed over the past week. She is noting lower abdominal pain, urinary frequency, malodorous and cloudy urine. These symptoms are like symptoms she has had in the past. Denies CP/palp/SOB/HA/congestion/fevers/GI c/o. Taking meds as prescribed  Past Medical History:  Diagnosis Date  . Arthritis   . Cancer (HCC)    HX BREAST CANCER/ SKIN CANCER  . Complication of anesthesia    N/V WITH MORPHINE  . Difficulty sleeping   . Fractured hip ([place])    LEFT - AUG 2016  . GERD (gastroesophageal reflux disease)   . Hyperlipidemia   . Hypertension   . Melanoma ([place])   . Neuropathy   . Nocturia   . Osteopenia   . 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 PONV (postoperative nausea and vomiting)    PT STATES MORPHINE CAUSED N/V  . Rosacea   . Sleep apnea   . Stage 1 breast cancer, ER+, right ([place]) 07/04/2017    Past Surgical History:  Procedure Laterality Date  . [place] STUDY N/A 06/21/2018   Procedure: [place] STUDY;  Surgeon: Lavena Bullion, DO;  [facility];  Service: Gastroenterology;  Laterality: N/A;  with impedance  . ABDOMINAL HYSTERECTOMY  2010  . APPENDECTOMY  1949  . [place]  . BREAST SURGERY     . CATARACT EXTRACTION Bilateral 2011  . CHOLECYSTECTOMY    . ESOPHAGEAL MANOMETRY N/A 06/21/2018   Procedure: ESOPHAGEAL MANOMETRY (EM);  Surgeon: Lavena Bullion, DO;  [facility];  Service: Gastroenterology;  Laterality: N/A;  . GALLBLADDER SURGERY  2015  . JOINT REPLACEMENT     RT TOTAL HIP / RT TOTAL KNEE  . MASTECTOMY  1998   BILATERAL   . [place] IMPEDANCE STUDY  06/21/2018   Procedure: [place] IMPEDANCE STUDY;  Surgeon: Lavena Bullion, DO;  [facility];  Service: Gastroenterology;;  . SKIN CANCER EXCISION  2016   RT SIDE OF NOSE  . TONSILLECTOMY  1953  . TOTAL HIP ARTHROPLASTY  2010   RIGHT  . TOTAL HIP ARTHROPLASTY Left 05/09/2015   Procedure: LEFT TOTAL HIP ARTHROPLASTY ANTERIOR APPROACH;  Surgeon: Gaynelle Arabian, MD;  [facility];  Service: Orthopedics;  Laterality: Left;  . TOTAL KNEE ARTHROPLASTY  2001  . TUMOR REMOVAL  2012   ABDOMINAL - NON CANCEROUS    Family History  Problem Relation Age of Onset  . Other Mother        complications from flu  . Other Father        unsure of cause  . Colon cancer Neg Hx     Social History   Socioeconomic History  . Marital status: Married  Spouse name: Not on file  . Number of children: 3  . Years of education: 16 years  . Highest education level: Not on file  Occupational History  . Occupation: Retired  Scientific laboratory technician  . Financial resource strain: Not on file  . Food insecurity    Worry: Not on file    Inability: Not on file  . Transportation needs    Medical: Not on file    Non-medical: Not on file  Tobacco Use  . Smoking status: Former Smoker    Quit date: 02/03/1976    Years since quitting: 43.2  . Smokeless tobacco: Never Used  Substance and Sexual Activity  . Alcohol use: No  . Drug use: No  . Sexual activity: Yes    Birth control/protection: Post-menopausal  Lifestyle  . Physical activity    Days per week: Not on file    Minutes per session: Not on file  . Stress: Not on file   Relationships  . Social Herbalist on phone: Not on file    Gets together: Not on file    Attends religious service: Not on file    Active member of club or organization: Not on file    Attends meetings of clubs or organizations: Not on file    Relationship status: Not on file  . Intimate partner violence    Fear of current or ex partner: Not on file    Emotionally abused: Not on file    Physically abused: Not on file    Forced sexual activity: Not on file  Other Topics Concern  . Not on file  Social History Narrative   Lives at home with husband.   Right-handed.   No caffeine use.    Outpatient Medications Prior to Visit  Medication Sig Dispense Refill  . Alum & Mag Hydroxide-Simeth (GI COCKTAIL) SUSP suspension Take 10 mLs by mouth 2 (two) times daily as needed for indigestion. Shake well. 900 mL 2  . AMBULATORY NON FORMULARY MEDICATION Take 10 mLs by mouth every 4 (four) hours as needed. Medication Name: Viscous Lidocaine, Bentyl, Maalox (equal parts) Take 35mL by mouth everey 4 hours as needed. 120 mL 3  . atorvastatin (LIPITOR) 20 MG tablet Take 1 tablet (20 mg total) by mouth daily. 90 tablet 1  . CALCIUM PO Take 1,200 mg by mouth daily.    . Cholecalciferol (VITAMIN D) 2000 units tablet Take 2,000 Units by mouth.    . cyclobenzaprine (FLEXERIL) 5 MG tablet Take 1 tablet (5 mg total) by mouth at bedtime as needed for muscle spasms. Three Month Supply 90 tablet 2  . diclofenac sodium (VOLTAREN) 1 % GEL Apply 1 application topically 3 (three) times daily. To feet, ankles, and knees. 3 Tube 4  . ergocalciferol (VITAMIN D2) 1.25 MG (50000 UT) capsule Take 1 capsule (50,000 Units total) by mouth once a week. 12 capsule 3  . HYDROcodone-acetaminophen (NORCO) 10-325 MG tablet Take 1 tablet by mouth every 6 (six) hours as needed. 120 tablet 0  . LINZESS 290 MCG CAPS capsule Take 1 capsule by mouth daily.    Marland Kitchen losartan (COZAAR) 25 MG tablet Take 1 tablet (25 mg total) by  mouth daily. 90 tablet 1  . ondansetron (ZOFRAN-ODT) 4 MG disintegrating tablet as needed.    Marland Kitchen Plecanatide (TRULANCE) 3 MG TABS Take 3 mg by mouth daily. 90 tablet 5  . pregabalin (LYRICA) 100 MG capsule Take 1 capsule (100 mg total) by mouth 3 (three)  times daily. 270 capsule 1  . tobramycin-dexamethasone (TOBRADEX) ophthalmic solution tobramycin 0.3 %-dexamethasone 0.1 % eye drops,suspension  INSTILL 1 DROP INTO RIGHT EYE TWICE DAILY FOR 2 WEEKS    . traZODone (DESYREL) 50 MG tablet Take 0.5-1 tablets (25-50 mg total) by mouth at bedtime. 30 tablet 2   No facility-administered medications prior to visit.     Allergies  Allergen Reactions  . Morphine And Related Nausea And Vomiting    Review of Systems  Constitutional: Positive for malaise/fatigue. Negative for fever.  HENT: Negative for congestion.   Eyes: Negative for blurred vision.  Respiratory: Negative for shortness of breath.   Cardiovascular: Negative for chest pain, palpitations and leg swelling.  Gastrointestinal: Positive for abdominal pain. Negative for blood in stool and nausea.  Genitourinary: Positive for frequency. Negative for dysuria.  Musculoskeletal: Negative for falls.  Skin: Negative for rash.  Neurological: Negative for dizziness, loss of consciousness and headaches.  Endo/Heme/Allergies: Negative for environmental allergies.  Psychiatric/Behavioral: Negative for depression. The patient is not nervous/anxious.        Objective:    Physical Exam Constitutional:      Appearance: Normal appearance. She is not ill-appearing.  HENT:     Head: Normocephalic and atraumatic.  Eyes:     General:        Right eye: No discharge.        Left eye: No discharge.  Pulmonary:     Effort: Pulmonary effort is normal.  Neurological:     Mental Status: She is alert and oriented to person, place, and time.  Psychiatric:        Mood and Affect: Mood normal.        Behavior: Behavior normal.     There were no  vitals taken for this visit. Wt Readings from Last 3 Encounters:  04/18/19 228 lb (103.4 kg)  03/22/19 228 lb (103.4 kg)  02/22/19 228 lb (103.4 kg)    Diabetic Foot Exam - Simple   No data filed     Lab Results  Component Value Date   WBC 7.0 10/11/2018   HGB 13.5 10/11/2018   HCT 42.8 10/11/2018   PLT 148 (L) 10/11/2018   GLUCOSE 102 (H) 10/11/2018   CHOL 121 02/02/2018   TRIG 101.0 02/02/2018   HDL 43.20 02/02/2018   LDLCALC 58 02/02/2018   ALT 56 (H) 10/11/2018   AST 62 (H) 10/11/2018   NA 138 10/11/2018   K 4.1 10/11/2018   CL 102 10/11/2018   CREATININE 0.77 10/11/2018   BUN 16 10/11/2018   CO2 29 10/11/2018   TSH 1.970 02/09/2017   INR 0.99 05/05/2015   HGBA1C 5.4 02/09/2017    Lab Results  Component Value Date   TSH 1.970 02/09/2017   Lab Results  Component Value Date   WBC 7.0 10/11/2018   HGB 13.5 10/11/2018   HCT 42.8 10/11/2018   MCV 90.7 10/11/2018   PLT 148 (L) 10/11/2018   Lab Results  Component Value Date   NA 138 10/11/2018   K 4.1 10/11/2018   CO2 29 10/11/2018   GLUCOSE 102 (H) 10/11/2018   BUN 16 10/11/2018   CREATININE 0.77 10/11/2018   BILITOT 0.7 10/11/2018   ALKPHOS 121 (H) 11/16/2018   AST 62 (H) 10/11/2018   ALT 56 (H) 10/11/2018   PROT 7.4 10/11/2018   ALBUMIN 4.1 10/11/2018   CALCIUM 9.9 10/11/2018   ANIONGAP 7 10/11/2018   GFR 103.45 07/11/2018   Lab Results  Component  Value Date   CHOL 121 02/02/2018   Lab Results  Component Value Date   HDL 43.20 02/02/2018   Lab Results  Component Value Date   LDLCALC 58 02/02/2018   Lab Results  Component Value Date   TRIG 101.0 02/02/2018   Lab Results  Component Value Date   CHOLHDL 3 02/02/2018   Lab Results  Component Value Date   HGBA1C 5.4 02/09/2017       Assessment & Plan:   Problem List Items Addressed This Visit    Urinary tract infection - Primary    She has a history of recurrent UTI and these symptoms are the same as she always gets with  urinary frequency, foul smelling and cloudy urine and abdominal pain. She reports good response to Ciprofloxacin in the past. Is started on Cipro 250 mg po bid, Pyridium 200 mg tid prn and probiotics, encouraged to increase fluid intake and she agrees to bring in a urine sample      Relevant Medications   phenazopyridine (PYRIDIUM) 200 MG tablet   Other Relevant Orders   Urine culture      I am having Valine Evitts start on ciprofloxacin and phenazopyridine. I am also having her maintain her ondansetron, Linzess, CALCIUM PO, Vitamin D, diclofenac sodium, gi cocktail, AMBULATORY NON FORMULARY MEDICATION, tobramycin-dexamethasone, traZODone, pregabalin, Trulance, cyclobenzaprine, ergocalciferol, losartan, atorvastatin, and HYDROcodone-acetaminophen.  Meds ordered this encounter  Medications  . ciprofloxacin (CIPRO) 250 MG tablet    Sig: Take 1 tablet (250 mg total) by mouth 2 (two) times daily.    Dispense:  14 tablet    Refill:  0  . phenazopyridine (PYRIDIUM) 200 MG tablet    Sig: Take 1 tablet (200 mg total) by mouth 3 (three) times daily as needed for pain.    Dispense:  10 tablet    Refill:  1     I discussed the assessment and treatment plan with the patient. The patient was provided an opportunity to ask questions and all were answered. The patient agreed with the plan and demonstrated an understanding of the instructions.   The patient was advised to call back or seek an in-person evaluation if the symptoms worsen or if the condition fails to improve as anticipated.  I provided 15 minutes of non-face-to-face time during this encounter.   Penni Homans, MD

## 2019-04-22 LAB — URINE CULTURE
MICRO NUMBER:: 1124385
SPECIMEN QUALITY:: ADEQUATE

## 2019-04-23 MED ORDER — CEFDINIR 300 MG PO CAPS: 300.0000 mg | ORAL_CAPSULE | Freq: Two times a day (BID) | ORAL | 0 refills | Status: AC

## 2019-04-23 NOTE — Addendum Note (Signed)
Addended by: Magdalene Molly A on: 04/23/2019 01:02 PM   Modules accepted: Orders

## 2019-04-24 LAB — DRUG TOX MONITOR 1 W/CONF, ORAL FLD
Amphetamines: NEGATIVE ng/mL (ref <10)
Barbiturates: NEGATIVE ng/mL (ref <10)
Benzodiazepines: NEGATIVE ng/mL (ref <0.50)
Buprenorphine: NEGATIVE ng/mL (ref <0.10)
Cocaine: NEGATIVE ng/mL (ref <5.0)
Codeine: NEGATIVE ng/mL (ref <2.5)
Dihydrocodeine: 26.8 ng/mL — ABNORMAL HIGH (ref <2.5)
Fentanyl: NEGATIVE ng/mL (ref <0.10)
Heroin Metabolite: NEGATIVE ng/mL (ref <1.0)
Hydrocodone: 189.3 ng/mL — ABNORMAL HIGH (ref <2.5)
Hydromorphone: NEGATIVE ng/mL (ref <2.5)
MARIJUANA: NEGATIVE ng/mL (ref <2.5)
MDMA: NEGATIVE ng/mL (ref <10)
Meprobamate: NEGATIVE ng/mL (ref <2.5)
Methadone: NEGATIVE ng/mL (ref <5.0)
Morphine: NEGATIVE ng/mL (ref <2.5)
Nicotine Metabolite: NEGATIVE ng/mL (ref <5.0)
Norhydrocodone: 11.2 ng/mL — ABNORMAL HIGH (ref <2.5)
Noroxycodone: NEGATIVE ng/mL (ref <2.5)
Opiates: POSITIVE ng/mL — AB (ref <2.5)
Oxycodone: NEGATIVE ng/mL (ref <2.5)
Oxymorphone: NEGATIVE ng/mL (ref <2.5)
Phencyclidine: NEGATIVE ng/mL (ref <10)
Tapentadol: NEGATIVE ng/mL (ref <5.0)
Tramadol: NEGATIVE ng/mL (ref <5.0)
Zolpidem: NEGATIVE ng/mL (ref <5.0)

## 2019-04-24 LAB — DRUG TOX ALC METAB W/CON, ORAL FLD: Alcohol Metabolite: NEGATIVE ng/mL (ref <25)

## 2019-04-30 ENCOUNTER — Other Ambulatory Visit: Payer: Self-pay

## 2019-04-30 ENCOUNTER — Ambulatory Visit (HOSPITAL_BASED_OUTPATIENT_CLINIC_OR_DEPARTMENT_OTHER)
Admission: RE | Admit: 2019-04-30 | Discharge: 2019-04-30 | Disposition: A | Discharge status: Home / Self Care | Payer: Medicare Other | Source: Ambulatory Visit | Attending: Family | Admitting: Family

## 2019-04-30 ENCOUNTER — Telehealth: Payer: Self-pay | Admitting: Gastroenterology

## 2019-04-30 ENCOUNTER — Inpatient Hospital Stay: Payer: Medicare Other | Attending: Hematology & Oncology

## 2019-04-30 ENCOUNTER — Encounter: Payer: Self-pay | Admitting: Hematology & Oncology

## 2019-04-30 ENCOUNTER — Inpatient Hospital Stay (HOSPITAL_BASED_OUTPATIENT_CLINIC_OR_DEPARTMENT_OTHER): Payer: Medicare Other | Admitting: Hematology & Oncology

## 2019-04-30 ENCOUNTER — Telehealth: Payer: Self-pay

## 2019-04-30 VITALS — BP 140/69 | HR 82 | Temp 97.1°F | Resp 19 | Wt 234.0 lb

## 2019-04-30 DIAGNOSIS — R590 Localized enlarged lymph nodes: Secondary | ICD-10-CM

## 2019-04-30 DIAGNOSIS — K5900 Constipation, unspecified: Secondary | ICD-10-CM | POA: Diagnosis not present

## 2019-04-30 DIAGNOSIS — C50911 Malignant neoplasm of unspecified site of right female breast: Secondary | ICD-10-CM | POA: Diagnosis not present

## 2019-04-30 DIAGNOSIS — Z853 Personal history of malignant neoplasm of breast: Secondary | ICD-10-CM | POA: Diagnosis not present

## 2019-04-30 DIAGNOSIS — Z170 Estrogen receptor positive status [ER+]: Secondary | ICD-10-CM | POA: Diagnosis not present

## 2019-04-30 DIAGNOSIS — R7989 Other specified abnormal findings of blood chemistry: Secondary | ICD-10-CM | POA: Diagnosis not present

## 2019-04-30 DIAGNOSIS — Z885 Allergy status to narcotic agent status: Secondary | ICD-10-CM | POA: Insufficient documentation

## 2019-04-30 DIAGNOSIS — E559 Vitamin D deficiency, unspecified: Secondary | ICD-10-CM

## 2019-04-30 DIAGNOSIS — R599 Enlarged lymph nodes, unspecified: Secondary | ICD-10-CM | POA: Diagnosis not present

## 2019-04-30 LAB — CMP (CANCER CENTER ONLY)
ALT: 55 U/L — ABNORMAL HIGH (ref 0–44)
AST: 76 U/L — ABNORMAL HIGH (ref 15–41)
Albumin: 4.2 g/dL (ref 3.5–5.0)
Alkaline Phosphatase: 135 U/L — ABNORMAL HIGH (ref 38–126)
Anion gap: 5 (ref 5–15)
BUN: 12 mg/dL (ref 8–23)
CO2: 30 mmol/L (ref 22–32)
Calcium: 9.8 mg/dL (ref 8.9–10.3)
Chloride: 104 mmol/L (ref 98–111)
Creatinine: 0.7 mg/dL (ref 0.44–1.00)
GFR, Est AFR Am: >60 mL/min (ref >60)
GFR, Estimated: >60 mL/min (ref >60)
Glucose, Bld: 108 mg/dL — ABNORMAL HIGH (ref 70–99)
Potassium: 4.6 mmol/L (ref 3.5–5.1)
Sodium: 139 mmol/L (ref 135–145)
Total Bilirubin: 0.7 mg/dL (ref 0.3–1.2)
Total Protein: 7.3 g/dL (ref 6.5–8.1)

## 2019-04-30 LAB — CBC WITH DIFFERENTIAL (CANCER CENTER ONLY)
Abs Immature Granulocytes: 0.01 10*3/uL (ref 0.00–0.07)
Basophils Absolute: 0 10*3/uL (ref 0.0–0.1)
Basophils Relative: 0 %
Eosinophils Absolute: 0.3 10*3/uL (ref 0.0–0.5)
Eosinophils Relative: 5 %
HCT: 42.1 % (ref 36.0–46.0)
Hemoglobin: 13.4 g/dL (ref 12.0–15.0)
Immature Granulocytes: 0 %
Lymphocytes Relative: 37 %
Lymphs Abs: 1.9 10*3/uL (ref 0.7–4.0)
MCH: 29.1 pg (ref 26.0–34.0)
MCHC: 31.8 g/dL (ref 30.0–36.0)
MCV: 91.5 fL (ref 80.0–100.0)
Monocytes Absolute: 0.4 10*3/uL (ref 0.1–1.0)
Monocytes Relative: 8 %
Neutro Abs: 2.6 10*3/uL (ref 1.7–7.7)
Neutrophils Relative %: 50 %
Platelet Count: 119 10*3/uL — ABNORMAL LOW (ref 150–400)
RBC: 4.6 MIL/uL (ref 3.87–5.11)
RDW: 13.1 % (ref 11.5–15.5)
WBC Count: 5.3 10*3/uL (ref 4.0–10.5)
nRBC: 0 % (ref 0.0–0.2)

## 2019-04-30 LAB — VITAMIN D 25 HYDROXY (VIT D DEFICIENCY, FRACTURES): Vit D, 25-Hydroxy: 60.99 ng/mL (ref 30–100)

## 2019-04-30 MED ORDER — IOHEXOL 300 MG/ML  SOLN
100.0000 mL | Freq: Once | INTRAMUSCULAR | Status: AC | PRN
  Administered 2019-04-30: 100 mL via INTRAVENOUS

## 2019-04-30 NOTE — Telephone Encounter (Signed)
Spoke to patient to discus her constipation issues. She states that she continues to take Trulance as prescribed,but still struggles with constipation. She has a follow up appointment with dr Bryan Lemma in January.

## 2019-04-30 NOTE — Telephone Encounter (Signed)
Spoke to patient. Appointment scheduled for follow up constipation.

## 2019-04-30 NOTE — Telephone Encounter (Signed)
-----   Message from Sylvia, DO sent at 04/30/2019  1:00 PM EST ----- This patient was last seen by me in 10/2018.  Constipation was suboptimally controlled at that time with Linzess, so she was changed to Trulance.  Can you call and see if she has been taking this as prescribed and schedule her for follow-up appoint with me in the clinic?  Thank you. ----- Message ----- From: [name], MD Sent: 04/30/2019  12:42 PM EST To: [place]:  Can you please help [name]?  She is badly constipated.  She had elevated LFT's.  A recent CT scan did not show any obvious hepatic lesions.  Could this be a fatty liver??   Thanks for all you do!!  Hope you had a nice Thanksgiving with your family!!  Crystal Lee

## 2019-04-30 NOTE — Progress Notes (Signed)
Hematology and Oncology Follow Up Visit  Phoebie Fudge [id number] [birth year] 85 y.o. 04/30/2019   Principle Diagnosis:  Bilateral stage Ia breast cancer-1998; thoracic adenopathy of undetermined significance  Current Therapy:   Observation   Interim History:  Ms. Grennell is here today for follow-up.  She had a decent Thanksgiving.  She was with some of her family.  Again, with the coronavirus, she is still being very cautious.  It is still tough with respect to her husband passing.  He passed away in Aug 01, 2022.  She is still trying to deal with a lot of the legal issues.  Thankfully, she had a CT scan done today.  The CT scan was a follow-up on the adenopathy.  Everything looks very stable.  There is nothing that looks like there is growth.  There is nothing that looks like any malignancy.  Her liver function studies are elevated.  They have been elevated for a while.  I am not sure as to why they are elevated.  She may have a fatty liver.  She had no obvious lesions on the CT scan.  She is on atorvastatin.  I will know if this needs to be stopped.  She is also having problems with constipation.  She is on Linzess.,  Sure how much this is really helping.  She has had no fever.  She has occasional swelling of her left leg.  She has had no headache.  Overall, her performance status is [name].   Medications:  Allergies as of 04/30/2019      Reactions   Morphine And Related Nausea And Vomiting      Medication List       Accurate as of April 30, 2019 12:17 PM. If you have any questions, ask your nurse or doctor.        AMBULATORY NON FORMULARY MEDICATION Take 10 mLs by mouth every 4 (four) hours as needed. Medication Name: Viscous Lidocaine, Bentyl, Maalox (equal parts) Take 73mL by mouth everey 4 hours as needed.   atorvastatin 20 MG tablet Commonly known as: LIPITOR Take 1 tablet (20 mg total) by mouth daily.   CALCIUM PO Take 1,200 mg by mouth daily.   cefdinir  300 MG capsule Commonly known as: OMNICEF Take 1 capsule (300 mg total) by mouth 2 (two) times daily for 7 days.   ciprofloxacin 250 MG tablet Commonly known as: Cipro Take 1 tablet (250 mg total) by mouth 2 (two) times daily.   cyclobenzaprine 5 MG tablet Commonly known as: FLEXERIL Take 1 tablet (5 mg total) by mouth at bedtime as needed for muscle spasms. Three Month Supply   diclofenac sodium 1 % Gel Commonly known as: VOLTAREN Apply 1 application topically 3 (three) times daily. To feet, ankles, and knees.   ergocalciferol 1.25 MG (50000 UT) capsule Commonly known as: VITAMIN D2 Take 1 capsule (50,000 Units total) by mouth once a week.   gi cocktail Susp suspension Take 10 mLs by mouth 2 (two) times daily as needed for indigestion. Shake well.   HYDROcodone-acetaminophen 10-325 MG tablet Commonly known as: NORCO Take 1 tablet by mouth every 6 (six) hours as needed.   Linzess 290 MCG Caps capsule Generic drug: linaclotide Take 1 capsule by mouth daily.   losartan 25 MG tablet Commonly known as: COZAAR Take 1 tablet (25 mg total) by mouth daily.   ondansetron 4 MG disintegrating tablet Commonly known as: ZOFRAN-ODT as needed.   phenazopyridine 200 MG tablet Commonly known as: Pyridium Take  1 tablet (200 mg total) by mouth 3 (three) times daily as needed for pain.   pregabalin 100 MG capsule Commonly known as: LYRICA Take 1 capsule (100 mg total) by mouth 3 (three) times daily.   tobramycin-dexamethasone ophthalmic solution Commonly known as: TOBRADEX tobramycin 0.3 %-dexamethasone 0.1 % eye drops,suspension  INSTILL 1 DROP INTO RIGHT EYE TWICE DAILY FOR 2 WEEKS   traZODone 50 MG tablet Commonly known as: DESYREL Take 0.5-1 tablets (25-50 mg total) by mouth at bedtime.   Trulance 3 MG Tabs Generic drug: Plecanatide Take 3 mg by mouth daily.   Vitamin D 50 MCG (2000 UT) tablet Take 2,000 Units by mouth.       Allergies:  Allergies  Allergen  Reactions  . Morphine And Related Nausea And Vomiting    Past Medical History, Surgical history, Social history, and Family History were reviewed and updated.  Review of Systems: Review of Systems  Constitutional: Negative.   HENT: Negative.   Eyes: Negative.   Respiratory: Negative.   Cardiovascular: Positive for leg swelling.  Gastrointestinal: Positive for constipation.  Genitourinary: Negative.   Musculoskeletal: Negative.   Skin: Negative.   Neurological: Negative.   Endo/Heme/Allergies: Negative.   Psychiatric/Behavioral: Negative.      Physical Exam:  weight is 234 lb (106.1 kg). Her temporal temperature is 97.1 F (36.2 C) (abnormal). Her blood pressure is 140/69 and her pulse is 82. Her respiration is 19 and oxygen saturation is 96%.   Wt Readings from Last 3 Encounters:  04/30/19 234 lb (106.1 kg)  04/18/19 228 lb (103.4 kg)  03/22/19 228 lb (103.4 kg)    Physical Exam Vitals signs reviewed.  HENT:     Head: Normocephalic and atraumatic.  Eyes:     Pupils: Pupils are equal, round, and reactive to light.  Neck:     Musculoskeletal: Normal range of motion.  Cardiovascular:     Rate and Rhythm: Normal rate and regular rhythm.     Heart sounds: Normal heart sounds.  Pulmonary:     Effort: Pulmonary effort is normal.     Breath sounds: Normal breath sounds.  Abdominal:     General: Bowel sounds are normal.     Palpations: Abdomen is soft.  Musculoskeletal: Normal range of motion.        General: No tenderness or deformity.  Lymphadenopathy:     Cervical: No cervical adenopathy.  Skin:    General: Skin is warm and dry.     Findings: No erythema or rash.  Neurological:     Mental Status: She is alert and oriented to person, place, and time.  Psychiatric:        Behavior: Behavior normal.        Thought Content: Thought content normal.        Judgment: Judgment normal.      Lab Results  Component Value Date   WBC 5.3 04/30/2019   HGB 13.4  04/30/2019   HCT 42.1 04/30/2019   MCV 91.5 04/30/2019   PLT 119 (L) 04/30/2019   Lab Results  Component Value Date   FERRITIN 131 02/09/2017   Lab Results  Component Value Date   RBC 4.60 04/30/2019   No results found for: KPAFRELGTCHN, LAMBDASER, [facility] Lab Results  Component Value Date   IGGSERUM 1,085 02/09/2017   IGMSERUM 336 (H) 02/09/2017   No results found for: TOTALPROTELP, ALBUMINELP, A1GS, A2GS, BETS, BETA2SER, GAMS, MSPIKE, SPEI   Chemistry      Component Value Date/Time  NA 139 04/30/2019 0939   NA 142 02/09/2017 1039   K 4.6 04/30/2019 0939   CL 104 04/30/2019 0939   CO2 30 04/30/2019 0939   BUN 12 04/30/2019 0939   BUN 13 02/09/2017 1039   CREATININE 0.70 04/30/2019 0939      Component Value Date/Time   CALCIUM 9.8 04/30/2019 0939   ALKPHOS 135 (H) 04/30/2019 0939   AST 76 (H) 04/30/2019 0939   ALT 55 (H) 04/30/2019 0939   BILITOT 0.7 04/30/2019 0939       Impression and Plan: Ms. Balasubramanian is a very pleasant 85 yo caucasian female with history of bilateral stage Ia breast cancer diagnosed back in 1998.  I do not see any issues with respect to the breast cancer.  As far as these adenopathy is concerned, I really do not think that we really need to pursue another CT scan for about a year.  I am more worried about her liver studies.  I do not know if this is from a fatty liver.  I think gastroenterology can help [place] out with this along with the constipation.  We will still plan to get her back in 6 months.  Hopefully, she will be feeling a little better at that time.   [name], MD 11/30/202012:17 PM

## 2019-04-30 NOTE — Telephone Encounter (Signed)
Please review previous message. 

## 2019-05-01 ENCOUNTER — Telehealth: Payer: Self-pay | Admitting: Family Medicine

## 2019-05-01 ENCOUNTER — Other Ambulatory Visit: Payer: Medicare Other

## 2019-05-01 ENCOUNTER — Ambulatory Visit: Payer: Medicare Other | Admitting: Hematology & Oncology

## 2019-05-01 ENCOUNTER — Telehealth: Payer: Self-pay | Admitting: *Deleted

## 2019-05-01 LAB — CANCER ANTIGEN 27.29: CA 27.29: 23.3 U/mL (ref 0.0–38.6)

## 2019-05-01 MED ORDER — CEFDINIR 300 MG PO CAPS: 300.0000 mg | ORAL_CAPSULE | Freq: Two times a day (BID) | ORAL | 0 refills | Status: AC

## 2019-05-01 NOTE — Telephone Encounter (Signed)
Oral swab drug screen was consistent for prescribed medications.  ?

## 2019-05-01 NOTE — Telephone Encounter (Signed)
Spoke with patient and let her know that we sent in Cefdinir  Patient made aware and voiced her understanding

## 2019-05-01 NOTE — Telephone Encounter (Signed)
Pt stated she is still having frequent urination and pain. She has finished the medication prescribed by Dr. Charlett Blake for her UTI. She would like to know if she needs to be seen and give another sample or if something else can be sent to her pharmacy. Please advise. Requesting CB today.

## 2019-05-01 NOTE — Telephone Encounter (Signed)
Pt seen by Charlett Blake on 11/20 for same concern

## 2019-05-16 ENCOUNTER — Encounter: Payer: Self-pay | Admitting: Registered Nurse

## 2019-05-16 ENCOUNTER — Encounter: Payer: Medicare Other | Attending: Physical Medicine & Rehabilitation | Admitting: Registered Nurse

## 2019-05-16 ENCOUNTER — Other Ambulatory Visit: Payer: Self-pay

## 2019-05-16 ENCOUNTER — Ambulatory Visit: Payer: Medicare Other | Admitting: Registered Nurse

## 2019-05-16 VITALS — Ht 66.0 in | Wt 230.0 lb

## 2019-05-16 DIAGNOSIS — M170 Bilateral primary osteoarthritis of knee: Secondary | ICD-10-CM

## 2019-05-16 DIAGNOSIS — S32000S Wedge compression fracture of unspecified lumbar vertebra, sequela: Secondary | ICD-10-CM

## 2019-05-16 DIAGNOSIS — M79604 Pain in right leg: Secondary | ICD-10-CM | POA: Diagnosis not present

## 2019-05-16 DIAGNOSIS — M7918 Myalgia, other site: Secondary | ICD-10-CM | POA: Insufficient documentation

## 2019-05-16 DIAGNOSIS — M961 Postlaminectomy syndrome, not elsewhere classified: Secondary | ICD-10-CM

## 2019-05-16 DIAGNOSIS — M79605 Pain in left leg: Secondary | ICD-10-CM | POA: Insufficient documentation

## 2019-05-16 DIAGNOSIS — M2550 Pain in unspecified joint: Secondary | ICD-10-CM

## 2019-05-16 DIAGNOSIS — M4856XD Collapsed vertebra, not elsewhere classified, lumbar region, subsequent encounter for fracture with routine healing: Secondary | ICD-10-CM

## 2019-05-16 DIAGNOSIS — K219 Gastro-esophageal reflux disease without esophagitis: Secondary | ICD-10-CM | POA: Insufficient documentation

## 2019-05-16 DIAGNOSIS — M62838 Other muscle spasm: Secondary | ICD-10-CM

## 2019-05-16 DIAGNOSIS — I10 Essential (primary) hypertension: Secondary | ICD-10-CM | POA: Insufficient documentation

## 2019-05-16 DIAGNOSIS — Z8673 Personal history of transient ischemic attack (TIA), and cerebral infarction without residual deficits: Secondary | ICD-10-CM | POA: Insufficient documentation

## 2019-05-16 DIAGNOSIS — M1712 Unilateral primary osteoarthritis, left knee: Secondary | ICD-10-CM | POA: Insufficient documentation

## 2019-05-16 DIAGNOSIS — Z853 Personal history of malignant neoplasm of breast: Secondary | ICD-10-CM | POA: Insufficient documentation

## 2019-05-16 DIAGNOSIS — R202 Paresthesia of skin: Secondary | ICD-10-CM | POA: Diagnosis not present

## 2019-05-16 DIAGNOSIS — Z96643 Presence of artificial hip joint, bilateral: Secondary | ICD-10-CM | POA: Insufficient documentation

## 2019-05-16 DIAGNOSIS — G4730 Sleep apnea, unspecified: Secondary | ICD-10-CM | POA: Insufficient documentation

## 2019-05-16 DIAGNOSIS — Z96651 Presence of right artificial knee joint: Secondary | ICD-10-CM

## 2019-05-16 DIAGNOSIS — G8929 Other chronic pain: Secondary | ICD-10-CM | POA: Insufficient documentation

## 2019-05-16 DIAGNOSIS — X58XXXD Exposure to other specified factors, subsequent encounter: Secondary | ICD-10-CM

## 2019-05-16 DIAGNOSIS — E785 Hyperlipidemia, unspecified: Secondary | ICD-10-CM | POA: Insufficient documentation

## 2019-05-16 DIAGNOSIS — M25532 Pain in left wrist: Secondary | ICD-10-CM | POA: Diagnosis not present

## 2019-05-16 DIAGNOSIS — S32519D Fracture of superior rim of unspecified pubis, subsequent encounter for fracture with routine healing: Secondary | ICD-10-CM

## 2019-05-16 DIAGNOSIS — Z9889 Other specified postprocedural states: Secondary | ICD-10-CM | POA: Insufficient documentation

## 2019-05-16 DIAGNOSIS — Z9049 Acquired absence of other specified parts of digestive tract: Secondary | ICD-10-CM | POA: Insufficient documentation

## 2019-05-16 DIAGNOSIS — M8580 Other specified disorders of bone density and structure, unspecified site: Secondary | ICD-10-CM | POA: Insufficient documentation

## 2019-05-16 DIAGNOSIS — Z79899 Other long term (current) drug therapy: Secondary | ICD-10-CM

## 2019-05-16 DIAGNOSIS — Z8489 Family history of other specified conditions: Secondary | ICD-10-CM | POA: Insufficient documentation

## 2019-05-16 DIAGNOSIS — M5416 Radiculopathy, lumbar region: Secondary | ICD-10-CM

## 2019-05-16 DIAGNOSIS — Z9013 Acquired absence of bilateral breasts and nipples: Secondary | ICD-10-CM | POA: Insufficient documentation

## 2019-05-16 DIAGNOSIS — G894 Chronic pain syndrome: Secondary | ICD-10-CM

## 2019-05-16 DIAGNOSIS — Z87891 Personal history of nicotine dependence: Secondary | ICD-10-CM | POA: Insufficient documentation

## 2019-05-16 DIAGNOSIS — Z85820 Personal history of malignant melanoma of skin: Secondary | ICD-10-CM | POA: Insufficient documentation

## 2019-05-16 DIAGNOSIS — Z5181 Encounter for therapeutic drug level monitoring: Secondary | ICD-10-CM

## 2019-05-16 MED ORDER — HYDROCODONE-ACETAMINOPHEN 10-325 MG PO TABS: 1.0000 | ORAL_TABLET | Freq: Four times a day (QID) | ORAL | 0 refills | Status: DC | PRN
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
## 2019-06-06 ENCOUNTER — Encounter: Payer: Self-pay | Admitting: Gastroenterology

## 2019-06-06 ENCOUNTER — Ambulatory Visit (INDEPENDENT_AMBULATORY_CARE_PROVIDER_SITE_OTHER): Payer: Medicare Other | Admitting: Gastroenterology

## 2019-06-06 ENCOUNTER — Other Ambulatory Visit: Payer: Self-pay

## 2019-06-06 VITALS — BP 138/74 | HR 84 | Temp 97.7°F | Ht 66.0 in | Wt 228.1 lb

## 2019-06-06 DIAGNOSIS — R748 Abnormal levels of other serum enzymes: Secondary | ICD-10-CM

## 2019-06-06 DIAGNOSIS — R7401 Elevation of levels of liver transaminase levels: Secondary | ICD-10-CM | POA: Diagnosis not present

## 2019-06-06 DIAGNOSIS — K5900 Constipation, unspecified: Secondary | ICD-10-CM | POA: Diagnosis not present

## 2019-06-06 DIAGNOSIS — Z86010 Personal history of colonic polyps: Secondary | ICD-10-CM | POA: Diagnosis not present

## 2019-06-06 DIAGNOSIS — D696 Thrombocytopenia, unspecified: Secondary | ICD-10-CM | POA: Diagnosis not present

## 2019-06-06 NOTE — Progress Notes (Signed)
P  Chief Complaint:    Constipation, fatty liver  GI History:  86 year old female who moved from [place], [place] to [place], [place], previously followed by[facility] at [facility] in [place] then by a few Psychiatric nurse in [place] for reflux and constipation, initially seen by me in 04/2017.  1) GERD: Index sxs of belching along with NCCP.Has been present for over 20 years. Has been evaluated in ER several times for these sxs with negative cardiac w/u. Had an EGD in the past (approx 2.5 years ago), but cannot recall results aside from hiatal hernia. Treated with Protonix and GI cocktail. She stopped Protonix due to lack of efficacy and treated with prn GI cocktail alone, which resolves sxs. Has also trialed omeprazole, esomeprazole, Zantac all without any improvement in sxs. No dysphagia or odynophagia. Sxs occur 2-3 times/week. Tried bland diet diet with some improvement. Worse with coffee, chocolate, spicy foods, tomato based sauces.   2) Constipation: Long standing history of constipation. Has been taking Linzess periodically for years.  Was taking prn along with dietary modifications.  Symptoms worsened in 2020, and changed to Trulance, which initially worked, then lost efficacy.  Has since reverted back to Linzess.She does have a history of osteoporosis along with recent pelvic fracture. Started on vitamin D 2019.Constipation worsened with prior calcium supplement; has since stopped.  3) Elevated ALP: Was 130 in 04/2017 in the setting of active/recent pelvic fractures. Normal T bili.  Previous peak of 146 in 05/2015, otherwise largely in the 80s.  Repeat in 09/2018 was 156, then 121 10/2018.  5' nucleotidase elevated at 15 and elevated GGT at 123 in 10/2018.  MRCP 10/2018 with hepatic steatosis, stable lymphadenopathy, normal bile ducts.  4) Elevated AST/ALT: AST/ALT normal in 07/2017.  Slight uptrend since then to 62/56 in 09/2018 and 76/55 in 04/2019.  Normal  albumin.  No INR.  PLT had been in the 140s, more recently 119 in 04/2019.  Endoscopic history: -Colonoscopy (09/2015, [place], [place]): 2subcentimeter tubular adenomas. Recommended repeat in 5 years for ongoing surveillance. -EGD (03/2015, [place], [place]): Non-H. pylori gastritis, gastric polyp. - Esophageal Manometry (2020): Normal - pH/impedance (2020): Normal with JD score of 4 and pH <4 was 0.8% (states that she avoided eating during the study due to gag reflex from probe in place)  HPI:    Patient is a 86 y.o. female presenting to the Gastroenterology Clinic for follow-up.  Was last seen by me (telehealth) in 10/2018.  At that time constipation had recurred in the setting of antibiotics for UTI (Cipro in 09/2018, unit 11/2018).  L-spine fracture in 07/2018.  Was only taking Linzess intermittently, then stopped due to nausea/vomiting.  Started Trulance which worked initially, then lost efficacy.  Has since reverted back to Linzess, with improvement, along with prune juice.  However, constipation has continued to worsen.  Using suppositories now.  Increased straining and trouble initiating BM.  Does have intermittent scant BRB on tissue paper.  Fluid intake variable on a day-to-day basis.  Elevated ALP (156) in 09/2018, in the setting of L-spine fractures, but also elevated GGT and 5' nucleotidase.  MRCP was normal.    AST/ALT was 62/56 in 09/2018, and intermittently elevated in the past.  Slight uptrend to 76/55 in 04/2019.  Normal albumin, but PLT down trended to 119.  Otherwise normal CBC.  Was recently seen in the Hematology clinic with consideration for statin induced hepatopathy.    Repeat CT C/A/P in 04/2019 demonstrates  lobular hepatic contour is with fissural widening and mild heterogeneity.  No duct dilation.  Spleen at ULN size.  Follows in the Hematology/Oncology clinic regarding adenopathy-repeat 1 year.   Review of systems:     No chest pain, no SOB, no fevers, no urinary sx   Past  Medical History:  Diagnosis Date  . Arthritis   . Cancer (HCC)    HX BREAST CANCER/ SKIN CANCER  . Complication of anesthesia    N/V WITH MORPHINE  . Difficulty sleeping   . Fractured hip ([place])    LEFT - AUG 2016  . GERD (gastroesophageal reflux disease)   . Hyperlipidemia   . Hypertension   . Melanoma ([place])   . Neuropathy   . Nocturia   . Osteopenia   . 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 PONV (postoperative nausea and vomiting)    PT STATES MORPHINE CAUSED N/V  . Rosacea   . Sleep apnea   . Stage 1 breast cancer, ER+, right ([place]) 07/04/2017    Patient's surgical history, family medical history, social history, medications and allergies were all reviewed in Epic    Current Outpatient Medications  Medication Sig Dispense Refill  . AMBULATORY NON FORMULARY MEDICATION Take 10 mLs by mouth every 4 (four) hours as needed. Medication Name: Viscous Lidocaine, Bentyl, Maalox (equal parts) Take 21mL by mouth everey 4 hours as needed. 120 mL 3  . atorvastatin (LIPITOR) 20 MG tablet Take 1 tablet (20 mg total) by mouth daily. 90 tablet 1  . CALCIUM PO Take 1,200 mg by mouth daily.    . Cholecalciferol (VITAMIN D) 2000 units tablet Take 2,000 Units by mouth.    . cyclobenzaprine (FLEXERIL) 5 MG tablet Take 1 tablet (5 mg total) by mouth at bedtime as needed for muscle spasms. Three Month Supply 90 tablet 2  . HYDROcodone-acetaminophen (NORCO) 10-325 MG tablet Take 1 tablet by mouth every 6 (six) hours as needed. 120 tablet 0  . LINZESS 290 MCG CAPS capsule Take 1 capsule by mouth daily.    Marland Kitchen losartan (COZAAR) 25 MG tablet Take 1 tablet (25 mg total) by mouth daily. 90 tablet 1  . phenazopyridine (PYRIDIUM) 200 MG tablet Take 1 tablet (200 mg total) by mouth 3 (three) times daily as needed for pain. 10 tablet 1  . ergocalciferol (VITAMIN D2) 1.25 MG (50000 UT) capsule Take 1 capsule (50,000 Units total) by mouth once a week. (Patient not taking: Reported on 06/06/2019) 12  capsule 3  . ondansetron (ZOFRAN-ODT) 4 MG disintegrating tablet as needed.    Marland Kitchen Plecanatide (TRULANCE) 3 MG TABS Take 3 mg by mouth daily. (Patient not taking: Reported on 06/06/2019) 90 tablet 5  . tobramycin-dexamethasone (TOBRADEX) ophthalmic solution tobramycin 0.3 %-dexamethasone 0.1 % eye drops,suspension  INSTILL 1 DROP INTO RIGHT EYE TWICE DAILY FOR 2 WEEKS     No current facility-administered medications for this visit.    Physical Exam:     BP 138/74   Pulse 84   Temp 97.7 F (36.5 C)   Ht 5\' 6"  (1.676 m)   Wt 228 lb 2 oz (103.5 kg)   BMI 36.82 kg/m   GENERAL:  Pleasant female in NAD PSYCH: : Cooperative, normal affect EENT:  conjunctiva pink, mucous membranes moist, neck supple without masses SKIN:  turgor, no lesions seen Rectal: Exam deferred by patient today   IMPRESSION and PLAN:     1) Constipation: -Has been on multiple agents in the past.  Description in clinic  today seems more consistent with Pelvic Floor Dyssynergia however -Referral for Anorectal Manometry.  And if consistent with Pelvic Floor Dyssynergia, referral to PT for biofeedback -Can continue current medication regimen for now -Encouraged adequate hydration  2) Hematochezia: -Suspect related to benign anal rectal disease from exacerbation of constipation (i.e. hemorrhoids).  Rectal exam deferred today -Depending on evaluation as above, and if ongoing symptoms, may require colonoscopy with evaluation for internal hemorrhoids and banding as appropriate  3) Elevated AST/ALT: -AST/ALT has slightly up trended over the last year or so.  Could be statin induced hepatopathy.  Decrease in platelets to 119 somewhat suspicious for impaired hepatic synthetic function.  Albumin otherwise normal.  Will check INR.  If elevated, plan for liver biopsy - Lipitor ok for now, but if >3x's ULN, will need to stop statin/change therapy  4) Elevated Alkaline Phosphatase: -Mildly elevated ALP in the setting of  fractures.  Has had extensive evaluation, to include MRCP, which is otherwise unrevealing  5) Upper abdominal adenopathy: -Stable from prior imaging.  Follows in the Oncology clinic with plan for repeat CT in 1 year  6) History of tubular adenomas: Colonoscopy in 2017 notable for 2 subcentimeter tubular adenomas with recommendation repeat 5 years for ongoing surveillance.   Can consider repeat colonoscopy for ongoing surveillance in 2022.  7) Thrombocytopenia: -Evaluating for hepatic etiology as above -If liver work-up otherwise unrevealing, can discuss with Hematologist  - RTC in 3 months  I spent 30-39 minutes of time, including in depth chart review, independent review of results as outlined above, communicating results with the patient directly, face-to-face time with the patient, coordinating care, and ordering studies and medications as appropriate. Greater than 50% of the time was spent counseling and coordinating care.         [place] ,DO, FACG 06/06/2019, 1:42 PM

## 2019-06-06 NOTE — Patient Instructions (Signed)
It has been recommended to you by your physician that you have a(n) Anorectal Manometry completed. Per your request, we did not schedule the procedure(s) today. Please contact our office at [phone] should you decide to have the procedure completed. You will be scheduled for a pre-visit and procedure at that time.  Your provider has requested that you go to the basement level for lab work at [facility] in [place]  [id number]. Press "B" on the elevator. The lab is located at the first door on the left as you exit the elevator.  Return to office in 3 months  It was a pleasure to see you today!  Vito Cirigliano, D.O.

## 2019-06-12 DIAGNOSIS — L304 Erythema intertrigo: Secondary | ICD-10-CM | POA: Diagnosis not present

## 2019-06-12 DIAGNOSIS — L905 Scar conditions and fibrosis of skin: Secondary | ICD-10-CM | POA: Diagnosis not present

## 2019-06-12 DIAGNOSIS — S0031XA Abrasion of nose, initial encounter: Secondary | ICD-10-CM | POA: Diagnosis not present

## 2019-06-12 DIAGNOSIS — L821 Other seborrheic keratosis: Secondary | ICD-10-CM | POA: Diagnosis not present

## 2019-06-12 DIAGNOSIS — L814 Other melanin hyperpigmentation: Secondary | ICD-10-CM | POA: Diagnosis not present

## 2019-06-12 DIAGNOSIS — B078 Other viral warts: Secondary | ICD-10-CM | POA: Diagnosis not present

## 2019-06-12 DIAGNOSIS — D1801 Hemangioma of skin and subcutaneous tissue: Secondary | ICD-10-CM | POA: Diagnosis not present

## 2019-06-12 DIAGNOSIS — L820 Inflamed seborrheic keratosis: Secondary | ICD-10-CM | POA: Diagnosis not present

## 2019-06-12 DIAGNOSIS — D225 Melanocytic nevi of trunk: Secondary | ICD-10-CM | POA: Diagnosis not present

## 2019-06-12 DIAGNOSIS — Z85828 Personal history of other malignant neoplasm of skin: Secondary | ICD-10-CM | POA: Diagnosis not present

## 2019-06-13 ENCOUNTER — Encounter: Payer: Medicare Other | Attending: Physical Medicine & Rehabilitation | Admitting: Registered Nurse

## 2019-06-13 ENCOUNTER — Other Ambulatory Visit: Payer: Self-pay

## 2019-06-13 ENCOUNTER — Encounter: Payer: Self-pay | Admitting: Registered Nurse

## 2019-06-13 VITALS — BP 130/79 | HR 84 | Temp 97.2°F | Ht 66.0 in | Wt 229.0 lb

## 2019-06-13 DIAGNOSIS — Z96643 Presence of artificial hip joint, bilateral: Secondary | ICD-10-CM | POA: Insufficient documentation

## 2019-06-13 DIAGNOSIS — Z9049 Acquired absence of other specified parts of digestive tract: Secondary | ICD-10-CM | POA: Diagnosis not present

## 2019-06-13 DIAGNOSIS — M62838 Other muscle spasm: Secondary | ICD-10-CM | POA: Diagnosis not present

## 2019-06-13 DIAGNOSIS — S32000S Wedge compression fracture of unspecified lumbar vertebra, sequela: Secondary | ICD-10-CM | POA: Diagnosis not present

## 2019-06-13 DIAGNOSIS — E785 Hyperlipidemia, unspecified: Secondary | ICD-10-CM | POA: Diagnosis not present

## 2019-06-13 DIAGNOSIS — M8580 Other specified disorders of bone density and structure, unspecified site: Secondary | ICD-10-CM | POA: Insufficient documentation

## 2019-06-13 DIAGNOSIS — M1712 Unilateral primary osteoarthritis, left knee: Secondary | ICD-10-CM | POA: Insufficient documentation

## 2019-06-13 DIAGNOSIS — Z5181 Encounter for therapeutic drug level monitoring: Secondary | ICD-10-CM | POA: Diagnosis not present

## 2019-06-13 DIAGNOSIS — Z85820 Personal history of malignant melanoma of skin: Secondary | ICD-10-CM | POA: Insufficient documentation

## 2019-06-13 DIAGNOSIS — R202 Paresthesia of skin: Secondary | ICD-10-CM

## 2019-06-13 DIAGNOSIS — M7918 Myalgia, other site: Secondary | ICD-10-CM | POA: Insufficient documentation

## 2019-06-13 DIAGNOSIS — Z8489 Family history of other specified conditions: Secondary | ICD-10-CM | POA: Diagnosis not present

## 2019-06-13 DIAGNOSIS — G4730 Sleep apnea, unspecified: Secondary | ICD-10-CM | POA: Insufficient documentation

## 2019-06-13 DIAGNOSIS — M170 Bilateral primary osteoarthritis of knee: Secondary | ICD-10-CM | POA: Diagnosis not present

## 2019-06-13 DIAGNOSIS — Z87891 Personal history of nicotine dependence: Secondary | ICD-10-CM | POA: Diagnosis not present

## 2019-06-13 DIAGNOSIS — M2550 Pain in unspecified joint: Secondary | ICD-10-CM | POA: Diagnosis not present

## 2019-06-13 DIAGNOSIS — Z9889 Other specified postprocedural states: Secondary | ICD-10-CM | POA: Insufficient documentation

## 2019-06-13 DIAGNOSIS — G894 Chronic pain syndrome: Secondary | ICD-10-CM

## 2019-06-13 DIAGNOSIS — I10 Essential (primary) hypertension: Secondary | ICD-10-CM | POA: Insufficient documentation

## 2019-06-13 DIAGNOSIS — M79605 Pain in left leg: Secondary | ICD-10-CM | POA: Insufficient documentation

## 2019-06-13 DIAGNOSIS — Z8673 Personal history of transient ischemic attack (TIA), and cerebral infarction without residual deficits: Secondary | ICD-10-CM | POA: Insufficient documentation

## 2019-06-13 DIAGNOSIS — M79604 Pain in right leg: Secondary | ICD-10-CM | POA: Insufficient documentation

## 2019-06-13 DIAGNOSIS — Z96651 Presence of right artificial knee joint: Secondary | ICD-10-CM | POA: Insufficient documentation

## 2019-06-13 DIAGNOSIS — M961 Postlaminectomy syndrome, not elsewhere classified: Secondary | ICD-10-CM

## 2019-06-13 DIAGNOSIS — K219 Gastro-esophageal reflux disease without esophagitis: Secondary | ICD-10-CM | POA: Diagnosis not present

## 2019-06-13 DIAGNOSIS — Z853 Personal history of malignant neoplasm of breast: Secondary | ICD-10-CM | POA: Diagnosis not present

## 2019-06-13 DIAGNOSIS — Z79899 Other long term (current) drug therapy: Secondary | ICD-10-CM | POA: Diagnosis not present

## 2019-06-13 DIAGNOSIS — Z9013 Acquired absence of bilateral breasts and nipples: Secondary | ICD-10-CM | POA: Diagnosis not present

## 2019-06-13 DIAGNOSIS — G8929 Other chronic pain: Secondary | ICD-10-CM | POA: Insufficient documentation

## 2019-06-13 MED ORDER — HYDROCODONE-ACETAMINOPHEN 10-325 MG PO TABS: 1.0000 | ORAL_TABLET | Freq: Four times a day (QID) | ORAL | 0 refills | Status: DC | PRN

## 2019-06-13 NOTE — Progress Notes (Signed)
Subjective:    Patient ID: Crystal Lee, female    DOB: 1933/07/07, 86 y.o.   MRN: [id number]  HPI: Tahiri Kofoed is a 86 y.o. female who returns for follow up appointment for chronic pain and medication refill. She states her pain is located in her lower back, bilateral lower extremities with tingling and numbness and bilateral knee pain. Also reports joint pain all over. She rates her pain 5. Her current exercise regime is walking and performing stretching exercises.  Ms. Lueke Morphine equivalent is 40.00  MME.  Last Oral Swab was Performed on 04/18/2019, it was consistent.   Pain Inventory Average Pain 5 Pain Right Now 5 My pain is burning, tingling and aching  In the last 24 hours, has pain interfered with the following? General activity 6 Relation with others 0 Enjoyment of life 7 What TIME of day is your pain at its worst? night Sleep (in general) Poor  Pain is worse with: walking, bending and standing Pain improves with: rest and medication Relief from Meds: 4  Mobility use a cane use a walker ability to climb steps?  no do you drive?  yes  Function I need assistance with the following:  household duties and shopping  Neuro/Psych trouble walking  Prior Studies Any changes since last visit?  no  Physicians involved in your care Any changes since last visit?  no   Family History  Problem Relation Age of Onset  . Other Mother        complications from flu  . Other Father        unsure of cause  . Colon cancer Neg Hx    Social History   Socioeconomic History  . Marital status: Married    Spouse name: Not on file  . Number of children: 3  . Years of education: 16 years  . Highest education level: Not on file  Occupational History  . Occupation: Retired  Tobacco Use  . Smoking status: Former Smoker    Quit date: 02/03/1976    Years since quitting: 43.3  . Smokeless tobacco: Never Used  Substance and Sexual Activity  . Alcohol use: No  . Drug use:  No  . Sexual activity: Yes    Birth control/protection: Post-menopausal  Other Topics Concern  . Not on file  Social History Narrative   Lives at home with husband.   Right-handed.   No caffeine use.   Social Determinants of Health   Financial Resource Strain:   . Difficulty of Paying Living Expenses: Not on file  Food Insecurity:   . Worried About Charity fundraiser in the Last Year: Not on file  . Ran Out of Food in the Last Year: Not on file  Transportation Needs:   . Lack of Transportation (Medical): Not on file  . Lack of Transportation (Non-Medical): Not on file  Physical Activity:   . Days of Exercise per Week: Not on file  . Minutes of Exercise per Session: Not on file  Stress:   . Feeling of Stress : Not on file  Social Connections:   . Frequency of Communication with Friends and Family: Not on file  . Frequency of Social Gatherings with Friends and Family: Not on file  . Attends Religious Services: Not on file  . Active Member of Clubs or Organizations: Not on file  . Attends Archivist Meetings: Not on file  . Marital Status: Not on file   Past Surgical History:  Procedure Laterality  Date  . 57 HOUR [place] STUDY N/A 06/21/2018   Procedure: 24 HOUR [place] STUDY;  Surgeon: Lavena Bullion, DO;  Location: [facility];  Service: Gastroenterology;  Laterality: N/A;  with impedance  . ABDOMINAL HYSTERECTOMY  2010  . APPENDECTOMY  1949  . [place]  . BREAST SURGERY    . CATARACT EXTRACTION Bilateral 2011  . CHOLECYSTECTOMY    . ESOPHAGEAL MANOMETRY N/A 06/21/2018   Procedure: ESOPHAGEAL MANOMETRY (EM);  Surgeon: Lavena Bullion, DO;  Location: [facility];  Service: Gastroenterology;  Laterality: N/A;  . GALLBLADDER SURGERY  2015  . JOINT REPLACEMENT     RT TOTAL HIP / RT TOTAL KNEE  . MASTECTOMY  1998   BILATERAL   . [place] IMPEDANCE STUDY  06/21/2018   Procedure: [place] IMPEDANCE STUDY;  Surgeon: Lavena Bullion, DO;  Location: [facility];   Service: Gastroenterology;;  . SKIN CANCER EXCISION  2016   RT SIDE OF NOSE  . TONSILLECTOMY  1953  . TOTAL HIP ARTHROPLASTY  2010   RIGHT  . TOTAL HIP ARTHROPLASTY Left 05/09/2015   Procedure: LEFT TOTAL HIP ARTHROPLASTY ANTERIOR APPROACH;  Surgeon: Gaynelle Arabian, MD;  Location: [facility];  Service: Orthopedics;  Laterality: Left;  . TOTAL KNEE ARTHROPLASTY  2001  . TUMOR REMOVAL  2012   ABDOMINAL - NON CANCEROUS   Past Medical History:  Diagnosis Date  . Arthritis   . Cancer (HCC)    HX BREAST CANCER/ SKIN CANCER  . Complication of anesthesia    N/V WITH MORPHINE  . Difficulty sleeping   . Fractured hip ([place])    LEFT - AUG 2016  . GERD (gastroesophageal reflux disease)   . Hyperlipidemia   . Hypertension   . Melanoma ([place])   . Neuropathy   . Nocturia   . Osteopenia   . 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 PONV (postoperative nausea and vomiting)    PT STATES MORPHINE CAUSED N/V  . Rosacea   . Sleep apnea   . Stage 1 breast cancer, ER+, right ([place]) 07/04/2017   There were no vitals taken for this visit.  Opioid Risk Score:   Fall Risk Score:  `1  Depression screen PHQ 2/9  Depression screen [facility] 2/9 11/22/2018 09/28/2018 06/02/2018 04/10/2018 02/20/2018 10/17/2017  Decreased Interest 0 0 0 0 0 0  Down, Depressed, Hopeless 0 0 0 0 0 0  PHQ - 2 Score 0 0 0 0 0 0  Altered sleeping - - - - - 3  Tired, decreased energy - - - - - 3  Change in appetite - - - - - 0  Feeling bad or failure about yourself  - - - - - 0  Trouble concentrating - - - - - 0  Moving slowly or fidgety/restless - - - - - 0  Suicidal thoughts - - - - - 0  PHQ-9 Score - - - - - 6  Difficult doing work/chores - - - - - Somewhat difficult     Review of Systems  Constitutional: Negative.   HENT: Negative.   Eyes: Negative.   Respiratory: Negative.   Cardiovascular: Positive for leg swelling.  Gastrointestinal: Negative.   Endocrine: Negative.   Genitourinary: Negative.     Musculoskeletal: Positive for arthralgias, back pain and gait problem.  Skin: Negative.   Allergic/Immunologic: Negative.   Hematological: Negative.   Psychiatric/Behavioral: Negative.   All other systems reviewed and are negative.      Objective:  Physical Exam Vitals and nursing note reviewed.  Constitutional:      Appearance: Normal appearance.  Cardiovascular:     Rate and Rhythm: Normal rate and regular rhythm.     Pulses: Normal pulses.     Heart sounds: Normal heart sounds.  Pulmonary:     Effort: Pulmonary effort is normal.     Breath sounds: Normal breath sounds.  Musculoskeletal:     Cervical back: Normal range of motion and neck supple.     Comments: Normal Muscle Bulk and Muscle Testing Reveals:  Upper Extremities: Full ROM and Muscle Strength 5/5  Lumbar Paraspinal Tenderness: L-4-L-5 Lower Extremities: Full ROM and Muscle Strength 5/5 Arises from chair slowly using walker for support Narrow Based Gait   Skin:    General: Skin is warm and dry.  Neurological:     Mental Status: She is alert and oriented to person, place, and time.  Psychiatric:        Mood and Affect: Mood normal.        Behavior: Behavior normal.           Assessment & Plan:  1. Chronic Bilateral Leg pain: Paresthesia: Continue to Monitor.06/13/2019 2.Paresthesia /Lumbar Radiculitis:Ms. Defreece states she has weaned herself off the Lyrica due to daytime drowsiness. We will continue to monitor.06/13/2019 3. Pain of Left Wrist/: No complaints today. :S/P Carpal Tunnel Release on 06/03/2017 by Dr. Ellene Route. Dr. Ellene Route Following.06/13/2019. 4. Fracture of superior pubic ramus.Dr. AluisioFollowing.Continue to monitor.06/13/2019. 3.BilateralKnee OA: Continue Voltaren Gel.Continue to monitor. Orthopedist following.06/13/2019. 4. Polyarthralgia: Continue to alternate with heat and ice therapy. Continue current medication regime. Continue to monitor.06/13/2019. 5. Chronic Pain  Syndrome:Refilled::Hydrocodone10/325 mg one tablet every 6 hours as needed for moderate pain #120.06/13/2019. 6. Lumbar Compression Fracture L2 and L3: Ms. Hetzel refused physical therapy.Continue with rest/ heat therapy. Continue to Monitor.06/13/2019. 7. Muscle Spasm: ContinueFlexeril 5 mg at HS. Continue to monitor.06/13/2019.  Telephone Call Location of patient:Home Location of provider: Office Total Time Spent:10 Minutes   F/U in 1 month

## 2019-06-18 DIAGNOSIS — Z4789 Encounter for other orthopedic aftercare: Secondary | ICD-10-CM

## 2019-06-18 DIAGNOSIS — G5601 Carpal tunnel syndrome, right upper limb: Secondary | ICD-10-CM | POA: Diagnosis not present

## 2019-06-18 HISTORY — DX: Encounter for other orthopedic aftercare: Z47.89

## 2019-06-19 ENCOUNTER — Telehealth: Payer: Self-pay

## 2019-06-19 NOTE — Telephone Encounter (Signed)
Spoke to patient who wanted me to contact her following her wrist surgery to to schedule an anal rectal manometry. Spoke to [place] in scheduling who stated all elective manometries are being put on hold due to rising cases of Covid-19. I will notify patient next month with an update on scheduling. Patient voiced understanding.

## 2019-06-29 ENCOUNTER — Other Ambulatory Visit: Payer: Self-pay | Admitting: Registered Nurse

## 2019-06-29 ENCOUNTER — Ambulatory Visit (INDEPENDENT_AMBULATORY_CARE_PROVIDER_SITE_OTHER): Payer: Medicare Other | Admitting: Family Medicine

## 2019-06-29 ENCOUNTER — Other Ambulatory Visit: Payer: Self-pay

## 2019-06-29 ENCOUNTER — Other Ambulatory Visit: Payer: Self-pay | Admitting: Family Medicine

## 2019-06-29 ENCOUNTER — Encounter: Payer: Self-pay | Admitting: Family Medicine

## 2019-06-29 VITALS — BP 138/78 | HR 87 | Temp 97.7°F | Resp 18 | Ht 66.0 in | Wt 230.2 lb

## 2019-06-29 DIAGNOSIS — Z23 Encounter for immunization: Secondary | ICD-10-CM | POA: Diagnosis not present

## 2019-06-29 DIAGNOSIS — N390 Urinary tract infection, site not specified: Secondary | ICD-10-CM

## 2019-06-29 LAB — POCT URINALYSIS DIP (MANUAL ENTRY)
Bilirubin, UA: NEGATIVE
Glucose, UA: NEGATIVE mg/dL
Ketones, POC UA: NEGATIVE mg/dL
Nitrite, UA: POSITIVE — AB
Spec Grav, UA: 1.025 (ref 1.010–1.025)
Urobilinogen, UA: 0.2 E.U./dL
pH, UA: 5.5 (ref 5.0–8.0)

## 2019-06-29 MED ORDER — AMOXICILLIN-POT CLAVULANATE 875-125 MG PO TABS: 1.0000 | ORAL_TABLET | Freq: Two times a day (BID) | ORAL | 0 refills | Status: DC

## 2019-06-29 MED ORDER — CEFTRIAXONE SODIUM 1 G IJ SOLR
1.0000 g | Freq: Once | INTRAMUSCULAR | Status: AC
  Administered 2019-06-29: 17:00:00 1 g via INTRAMUSCULAR

## 2019-06-29 NOTE — Progress Notes (Signed)
Patient ID: Crystal Lee, female    DOB: 10-28-1933  Age: 86 y.o. MRN: [id number]    Subjective:  Subjective  HPI Crystal Lee presents for urinary frequency and frequent uti.    Review of Systems  Constitutional: Negative for appetite change, diaphoresis, fatigue and unexpected weight change.  Eyes: Negative for pain, redness and visual disturbance.  Respiratory: Negative for cough, chest tightness, shortness of breath and wheezing.   Cardiovascular: Negative for chest pain, palpitations and leg swelling.  Endocrine: Negative for cold intolerance, heat intolerance, polydipsia, polyphagia and polyuria.  Genitourinary: Positive for dysuria, frequency and urgency. Negative for decreased urine volume, difficulty urinating and vaginal pain.  Neurological: Negative for dizziness, light-headedness, numbness and headaches.    History Past Medical History:  Diagnosis Date  . Arthritis   . Cancer (HCC)    HX BREAST CANCER/ SKIN CANCER  . Complication of anesthesia    N/V WITH MORPHINE  . Difficulty sleeping   . Fractured hip ([place])    LEFT - AUG 2016  . GERD (gastroesophageal reflux disease)   . Hyperlipidemia   . Hypertension   . Melanoma ([place])   . Neuropathy   . Nocturia   . Osteopenia   . 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 PONV (postoperative nausea and vomiting)    PT STATES MORPHINE CAUSED N/V  . Rosacea   . Sleep apnea   . Stage 1 breast cancer, ER+, right ([place]) 07/04/2017    She has a past surgical history that includes Breast surgery; Cholecystectomy; Total hip arthroplasty (2010); Mastectomy (1998); Joint replacement; Total knee arthroplasty (2001); Back surgery (1973); Skin cancer excision (2016); Appendectomy (1949); Tumor removal (2012); Total hip arthroplasty (Left, 05/09/2015); Abdominal hysterectomy (2010); Gallbladder surgery (2015); Tonsillectomy (1953); Cataract extraction (Bilateral, 2011); Esophageal manometry (N/A, 06/21/2018); 24 hour ph study (N/A,  06/21/2018); and PH impedance study (06/21/2018).   Her family history includes Other in her father and mother.She reports that she quit smoking about 43 years ago. She has never used smokeless tobacco. She reports that she does not drink alcohol or use drugs.  Current Outpatient Medications on File Prior to Visit  Medication Sig Dispense Refill  . AMBULATORY NON FORMULARY MEDICATION Take 10 mLs by mouth every 4 (four) hours as needed. Medication Name: Viscous Lidocaine, Bentyl, Maalox (equal parts) Take 63mL by mouth everey 4 hours as needed. 120 mL 3  . atorvastatin (LIPITOR) 20 MG tablet Take 1 tablet (20 mg total) by mouth daily. 90 tablet 1  . CALCIUM PO Take 1,200 mg by mouth daily.    . Cholecalciferol (VITAMIN D) 2000 units tablet Take 2,000 Units by mouth.    . cyclobenzaprine (FLEXERIL) 5 MG tablet Take 1 tablet (5 mg total) by mouth at bedtime as needed for muscle spasms. Three Month Supply 90 tablet 2  . ergocalciferol (VITAMIN D2) 1.25 MG (50000 UT) capsule Take 1 capsule (50,000 Units total) by mouth once a week. 12 capsule 3  . HYDROcodone-acetaminophen (NORCO) 10-325 MG tablet Take 1 tablet by mouth every 6 (six) hours as needed. 120 tablet 0  . LINZESS 290 MCG CAPS capsule Take 1 capsule by mouth daily.    Marland Kitchen losartan (COZAAR) 25 MG tablet Take 1 tablet (25 mg total) by mouth daily. 90 tablet 1  . ondansetron (ZOFRAN-ODT) 4 MG disintegrating tablet as needed.    . phenazopyridine (PYRIDIUM) 200 MG tablet Take 1 tablet (200 mg total) by mouth 3 (three) times daily as needed for pain. 10 tablet  1  . Plecanatide (TRULANCE) 3 MG TABS Take 3 mg by mouth daily. 90 tablet 5  . tobramycin-dexamethasone (TOBRADEX) ophthalmic solution tobramycin 0.3 %-dexamethasone 0.1 % eye drops,suspension  INSTILL 1 DROP INTO RIGHT EYE TWICE DAILY FOR 2 WEEKS    . cephALEXin (KEFLEX) 500 MG capsule Take 500 mg by mouth 4 (four) times daily.     No current facility-administered medications on file prior  to visit.     Objective:  Objective  Physical Exam Vitals and nursing note reviewed.  Constitutional:      Appearance: She is well-developed.  HENT:     Head: Normocephalic and atraumatic.  Eyes:     Conjunctiva/sclera: Conjunctivae normal.  Neck:     Thyroid: No thyromegaly.     Vascular: No carotid bruit or JVD.  Cardiovascular:     Rate and Rhythm: Normal rate and regular rhythm.     Heart sounds: Normal heart sounds. No murmur.  Pulmonary:     Effort: Pulmonary effort is normal. No respiratory distress.     Breath sounds: Normal breath sounds. No wheezing or rales.  Chest:     Chest wall: No tenderness.  Musculoskeletal:     Cervical back: Normal range of motion and neck supple.  Neurological:     Mental Status: She is alert and oriented to person, place, and time.    BP 138/78 (BP Location: Left Arm, Patient Position: Sitting, Cuff Size: Normal)   Pulse 87   Temp 97.7 F (36.5 C) (Temporal)   Resp 18   Ht 5\' 6"  (1.676 m)   Wt 230 lb 3.2 oz (104.4 kg)   SpO2 96%   BMI 37.16 kg/m  Wt Readings from Last 3 Encounters:  06/29/19 230 lb 3.2 oz (104.4 kg)  06/13/19 229 lb (103.9 kg)  06/06/19 228 lb 2 oz (103.5 kg)     Lab Results  Component Value Date   WBC 5.3 04/30/2019   HGB 13.4 04/30/2019   HCT 42.1 04/30/2019   PLT 119 (L) 04/30/2019   GLUCOSE 108 (H) 04/30/2019   CHOL 121 02/02/2018   TRIG 101.0 02/02/2018   HDL 43.20 02/02/2018   LDLCALC 58 02/02/2018   ALT 55 (H) 04/30/2019   AST 76 (H) 04/30/2019   NA 139 04/30/2019   K 4.6 04/30/2019   CL 104 04/30/2019   CREATININE 0.70 04/30/2019   BUN 12 04/30/2019   CO2 30 04/30/2019   TSH 1.970 02/09/2017   INR 0.99 05/05/2015   HGBA1C 5.4 02/09/2017   UA--+ leuk and nitrites  CT Chest W Contrast  Result Date: 04/30/2019 CLINICAL DATA:  Adenopathy, follow-up study. History of breast cancer status post bilateral mastectomy. EXAM: CT CHEST, ABDOMEN, AND PELVIS WITH CONTRAST TECHNIQUE:  Multidetector CT imaging of the chest, abdomen and pelvis was performed following the standard protocol during bolus administration of intravenous contrast. CONTRAST:  169mL OMNIPAQUE IOHEXOL 300 MG/ML  SOLN COMPARISON:  07/17/2018, 10/11/2018 FINDINGS: CT CHEST FINDINGS Cardiovascular: Calcified atherosclerotic changes throughout the thoracic aorta. No signs of aneurysm. No signs of acute aortic finding. Heart size is stable without pericardial effusion. Central pulmonary arteries are unremarkable. Mediastinum/Nodes: Pre pericardial and juxta pericardial lymph nodes in the chest largest along the anterior heart measures 1.4 cm in short axis (image 49, series 2) previously 1.3 cm. Along the right heart border a 1.5 cm lymph node (image 48, series 2) previously 1.5 cm. Other small, scattered lymph nodes in the chest including a left peribronchial lymph node at  nearly a cm are unchanged. Thoracic inlet structures are unremarkable. Esophagus is normal. Right internal mammary lymph node measuring 11 mm is unchanged dating back to January of 2019. Lungs/Pleura: No suspicious mass, nodule, consolidation or evidence of pleural effusion. There is basilar atelectasis. Airways are patent. Musculoskeletal: Signs of bilateral mastectomy and axillary dissection are unchanged. No chest wall mass. CT ABDOMEN PELVIS FINDINGS Hepatobiliary: Lobular hepatic contours with fissural widening and mild heterogeneity, no suspicious focal hepatic lesion to suggest hepatic metastatic disease. Post cholecystectomy without biliary ductal dilation. Pancreas: Unremarkable. No pancreatic ductal dilatation or surrounding inflammatory changes. Spleen: Normal in size without focal abnormality. Splenic size is however at upper limits of normal. Adrenals/Urinary Tract: Normal adrenal glands. Kidneys are unremarkable. Stomach/Bowel: No sign of acute gastrointestinal process. Stool fills much of the colon. Vascular/Lymphatic: Scattered calcified and  noncalcified plaque throughout the abdominal aorta. Signs of upper abdominal nodal enlargement about the celiac, largest approximately 1.9 cm in short axis previously approximately 1.8 cm (image 71, series 2) no other moderate-sized periportal lymph node (image 67, series 2 1.5 cm short axis, previously 1.8 cm. Portacaval nodal enlargement is unchanged. No new enlarged lymph nodes. No signs of pelvic lymphadenopathy. Reproductive: Post hysterectomy. Other: No abdominal wall hernia or abnormality. No abdominopelvic ascites. Musculoskeletal: No acute bone finding or destructive bone process. Signs of previous left vertebroplasty at the L3 level similar vertebral body height compared to prior exam. Signs of bilateral hip arthroplasty. IMPRESSION: 1. Lymph nodes within 1-2 mm of measurements back April of 2019 remain nonspecific. Lobular hepatic contours and borderline splenomegaly raising the question of background liver disease which could explain at least the upper abdominal lymph nodes and may sometimes be associated with juxta diaphragmatic nodal enlargement. Low-grade lymphoproliferative disorder is also considered, attention on follow-up or PET scan evaluation may be helpful as warranted. 2. Right internal mammary lymph nodes stable since 06/18/2017, close attention on follow-up given the patient's history of breast cancer. This is categorized separately as nodal enlargement in this area would be atypical for other processes mentioned above. 3. No new enlarged lymph nodes in the chest, abdomen or pelvis. 4. Aortic atherosclerosis. Based on nodular hepatic contour and changes above, correlate with any clinical or laboratory evidence of liver disease. 5. Signs of bilateral mastectomy and axillary dissection. 6. Signs of previous left vertebroplasty at the L3 level. Aortic Atherosclerosis (ICD10-I70.0). Electronically Signed   By: Zetta Bills M.D.   On: 04/30/2019 11:27   CT Abdomen Pelvis W Contrast  Result  Date: 04/30/2019 CLINICAL DATA:  Adenopathy, follow-up study. History of breast cancer status post bilateral mastectomy. EXAM: CT CHEST, ABDOMEN, AND PELVIS WITH CONTRAST TECHNIQUE: Multidetector CT imaging of the chest, abdomen and pelvis was performed following the standard protocol during bolus administration of intravenous contrast. CONTRAST:  143mL OMNIPAQUE IOHEXOL 300 MG/ML  SOLN COMPARISON:  07/17/2018, 10/11/2018 FINDINGS: CT CHEST FINDINGS Cardiovascular: Calcified atherosclerotic changes throughout the thoracic aorta. No signs of aneurysm. No signs of acute aortic finding. Heart size is stable without pericardial effusion. Central pulmonary arteries are unremarkable. Mediastinum/Nodes: Pre pericardial and juxta pericardial lymph nodes in the chest largest along the anterior heart measures 1.4 cm in short axis (image 49, series 2) previously 1.3 cm. Along the right heart border a 1.5 cm lymph node (image 48, series 2) previously 1.5 cm. Other small, scattered lymph nodes in the chest including a left peribronchial lymph node at nearly a cm are unchanged. Thoracic inlet structures are unremarkable. Esophagus is normal. Right internal  mammary lymph node measuring 11 mm is unchanged dating back to January of 2019. Lungs/Pleura: No suspicious mass, nodule, consolidation or evidence of pleural effusion. There is basilar atelectasis. Airways are patent. Musculoskeletal: Signs of bilateral mastectomy and axillary dissection are unchanged. No chest wall mass. CT ABDOMEN PELVIS FINDINGS Hepatobiliary: Lobular hepatic contours with fissural widening and mild heterogeneity, no suspicious focal hepatic lesion to suggest hepatic metastatic disease. Post cholecystectomy without biliary ductal dilation. Pancreas: Unremarkable. No pancreatic ductal dilatation or surrounding inflammatory changes. Spleen: Normal in size without focal abnormality. Splenic size is however at upper limits of normal. Adrenals/Urinary Tract:  Normal adrenal glands. Kidneys are unremarkable. Stomach/Bowel: No sign of acute gastrointestinal process. Stool fills much of the colon. Vascular/Lymphatic: Scattered calcified and noncalcified plaque throughout the abdominal aorta. Signs of upper abdominal nodal enlargement about the celiac, largest approximately 1.9 cm in short axis previously approximately 1.8 cm (image 71, series 2) no other moderate-sized periportal lymph node (image 67, series 2 1.5 cm short axis, previously 1.8 cm. Portacaval nodal enlargement is unchanged. No new enlarged lymph nodes. No signs of pelvic lymphadenopathy. Reproductive: Post hysterectomy. Other: No abdominal wall hernia or abnormality. No abdominopelvic ascites. Musculoskeletal: No acute bone finding or destructive bone process. Signs of previous left vertebroplasty at the L3 level similar vertebral body height compared to prior exam. Signs of bilateral hip arthroplasty. IMPRESSION: 1. Lymph nodes within 1-2 mm of measurements back April of 2019 remain nonspecific. Lobular hepatic contours and borderline splenomegaly raising the question of background liver disease which could explain at least the upper abdominal lymph nodes and may sometimes be associated with juxta diaphragmatic nodal enlargement. Low-grade lymphoproliferative disorder is also considered, attention on follow-up or PET scan evaluation may be helpful as warranted. 2. Right internal mammary lymph nodes stable since 06/18/2017, close attention on follow-up given the patient's history of breast cancer. This is categorized separately as nodal enlargement in this area would be atypical for other processes mentioned above. 3. No new enlarged lymph nodes in the chest, abdomen or pelvis. 4. Aortic atherosclerosis. Based on nodular hepatic contour and changes above, correlate with any clinical or laboratory evidence of liver disease. 5. Signs of bilateral mastectomy and axillary dissection. 6. Signs of previous left  vertebroplasty at the L3 level. Aortic Atherosclerosis (ICD10-I70.0). Electronically Signed   By: Zetta Bills M.D.   On: 04/30/2019 11:27     Assessment & Plan:  Plan  I am having Crystal Lee start on amoxicillin-clavulanate. I am also having her maintain her ondansetron, Linzess, CALCIUM PO, Vitamin D, AMBULATORY NON FORMULARY MEDICATION, tobramycin-dexamethasone, Trulance, cyclobenzaprine, ergocalciferol, losartan, atorvastatin, phenazopyridine, cephALEXin, and HYDROcodone-acetaminophen. We will continue to administer cefTRIAXone.  Meds ordered this encounter  Medications  . amoxicillin-clavulanate (AUGMENTIN) 875-125 MG tablet    Sig: Take 1 tablet by mouth 2 (two) times daily.    Dispense:  20 tablet    Refill:  0  . cefTRIAXone (ROCEPHIN) injection 1 g    Problem List Items Addressed This Visit    None    Visit Diagnoses    Need for influenza vaccination    -  Primary   Relevant Orders   Flu Vaccine QUAD High Dose(Fluad)   Frequent UTI       Relevant Medications   amoxicillin-clavulanate (AUGMENTIN) 875-125 MG tablet   cefTRIAXone (ROCEPHIN) injection 1 g   Other Relevant Orders   POCT urinalysis dipstick (Completed)   Urine Culture    last culture was resistant to cipro ---- augmentin sent  Rocephin IM given x1 rto 2 weeks to recheck urine and f/u pcp  Follow-up: Return in about 2 weeks (around 07/13/2019), or if symptoms worsen or fail to improve, for f/u pcp and urine.  Ann Held, DO

## 2019-06-29 NOTE — Patient Instructions (Signed)
COVID-19 Vaccine Information can be found at: [email or web address] For questions related to vaccine distribution or appointments, please email [email or web address] or call [phone].  Go to [email or web address] to get put on the waiting list or make an appointment

## 2019-07-01 LAB — URINE CULTURE
MICRO NUMBER:: 10096377
SPECIMEN QUALITY:: ADEQUATE

## 2019-07-02 DIAGNOSIS — M79641 Pain in right hand: Secondary | ICD-10-CM | POA: Diagnosis not present

## 2019-07-03 ENCOUNTER — Telehealth: Payer: Self-pay | Admitting: Family Medicine

## 2019-07-03 NOTE — Telephone Encounter (Signed)
Patient states that medication prescribed by DR Carollee Herter is not working.    amoxicillin-clavulanate (AUGMENTIN) 875-125 MG tablet [id number]    Wants to know if she can get someone to prescribe something else.   Patient would also like some more pain meds   phenazopyridine (PYRIDIUM) 200 MG tablet [id number]    Pharmacy Preferred in [place] in [place]

## 2019-07-04 MED ORDER — PHENAZOPYRIDINE HCL 200 MG PO TABS: 200.0000 mg | ORAL_TABLET | Freq: Three times a day (TID) | ORAL | 1 refills | Status: DC | PRN

## 2019-07-04 MED ORDER — NITROFURANTOIN MONOHYD MACRO 100 MG PO CAPS: 100.0000 mg | ORAL_CAPSULE | Freq: Two times a day (BID) | ORAL | 0 refills | Status: DC

## 2019-07-04 NOTE — Addendum Note (Signed)
Addended by: Sanda Linger on: 07/04/2019 09:45 AM   Modules accepted: Orders

## 2019-07-04 NOTE — Telephone Encounter (Signed)
Requesting: [facility] Contract:  UDS: Last OV: 01//29/21 w/ [place] Next OV: 07/11/19  Last Refill: 06/29/19, #10--1 RF Database:   Please advise

## 2019-07-04 NOTE — Telephone Encounter (Signed)
Send in macrobid 1 po bid x 7 days  I leave the pain meds for dr copland

## 2019-07-04 NOTE — Telephone Encounter (Signed)
See below. Please advise.  

## 2019-07-04 NOTE — Addendum Note (Signed)
Addended by: Lamar Blinks C on: 07/04/2019 11:58 AM   Modules accepted: Orders

## 2019-07-09 NOTE — Progress Notes (Addendum)
[facility] [employer] [place], [place], [place] [address] [phone]  Date:  07/11/2019   Name:  Crystal Lee   DOB:  [birth year]   MRN:  [id number]  PCP:  Darreld Mclean, MD    Chief Complaint: Urinary Tract Infection (follow up)   History of Present Illness:  Crystal Lee is a 86 y.o. very pleasant female patient who presents with the following:  Here today for short-term follow-up visit History of breast cancer status post bilateral mastectomy, osteoporosis, hypertension, hyperlipidemia, postlaminectomy syndrome She was seen by my partner Dr. Etter Sjogren on January 29 with potential UTI, started on Augmentin She then contacted [place] on February 2 with concern that Augmentin was not working, we changed to [employer].  She finished this prescription entirely  Urine culture did indeed show an E. coli UTI with intermediate resistance to Augmentin, susceptible to nitrofurantoin  She now notes that she continues to feel tired, otherwise she feels generally okay Her husband died just about a year ago  She is eating and drinking ok No fever No belly pain She notes that she tends to have frequent UTI, would like to bring in a urine culture to make sure she is cleared  Can suggest pneumonia booster-however patient brought in her complete immunization card, she is actually up-to-date on pneumonia and tetanus Routine lab work done in November-LFTs elevated.  This is being followed by Dr Bryan Lemma  I last saw her in April 2020 She is going to physical medicine rehab with [facility] for musculoskeletal pain issues They are treating her with hydrocodone and Flexeril at bedtime  06/13/2019  1   06/13/2019  Hydrocodone-Acetamin 10-325 MG  120.00  30 Eu Tho   [id number]   [place] ([id number])   0  40.00 MME  Comm Ins   [place]  05/16/2019  1   05/16/2019  Hydrocodone-Acetamin 10-325 MG  120.00  30 Eu Tho   [id number]   Cro ([id number])   0  40.00 MME  Comm Ins   [place]   04/18/2019  1   04/18/2019  Hydrocodone-Acetamin 10-325 MG  120.00  30 Eu Tho   [id number]   Cro ([id number])   0  40.00 MME  Comm Ins   [place]  03/22/2019  1   03/22/2019  Hydrocodone-Acetamin 10-325 MG  120.00  30 Eu Tho   [id number]   Cro ([id number])   0  40.00 MME  Comm Ins   [place]  02/22/2019  1   02/22/2019  Hydrocodone-Acetamin 10-325 MG  120.00  30 Eu Tho   [id number]   Cro (6538)   0  40.00 MME  Comm Ins   [place]  01/24/2019  1   01/24/2019  Hydrocodone-Acetamin 10-325 MG  120.00  30 Eu Tho   [id number]   Cro (6538)   0  40.00 MME  Comm Ins   [place]  12/21/2018  1   12/21/2018  Hydrocodone-Acetamin 10-325 MG  120.00  30 Eu Tho   [id number]   Cro (6538)   0  40.00 MME  Comm Ins   [place]  11/29/2018  1   11/22/2018  Hydrocodone-Acetamin 10-325 MG  120.00  30 Eu Tho   [id number]   Wal (2511)   0  40.00 MME  Comm Ins   [place]  10/28/2018  1   10/20/2018  Hydrocodone-Acetamin 10-325 MG  120.00  30 Eu Tho   [id number]   Wal (2511)   0  40.00 MME  Comm Ins   [place]  09/28/2018  1   09/28/2018  Hydrocodone-Acetamin 10-325 MG  120.00  30 Eu Tho   [id number]   Wal (2511)   0  40.00 MME  Comm Ins   [place]  08/31/2018  1   08/31/2018  Hydrocodone-Acetamin 7.5-325  120.00  30 Eu Tho   [id number]   Wal (2511)   0  30.00 MME       Patient Active Problem List   Diagnosis Date Noted  . Urinary tract infection 04/20/2019  . Lumbar post-laminectomy syndrome 07/31/2018  . Heartburn   . Osteopenia 04/15/2018  . Dyslipidemia 02/02/2018  . Gastroesophageal reflux disease 02/02/2018  . Primary osteoarthritis of left knee 10/17/2017  . History of right knee joint replacement 10/17/2017  . Pain in both lower extremities 08/02/2017  . Stage 1 breast cancer, ER+, right ([place]) 07/04/2017  . 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 Paresthesia 02/09/2017  . Low back pain 02/09/2017  . Gait abnormality 02/09/2017  . OA (osteoarthritis) of hip 05/09/2015    Past Medical History:  Diagnosis Date  . Arthritis   . Cancer (HCC)    HX BREAST CANCER/ SKIN CANCER  .  Complication of anesthesia    N/V WITH MORPHINE  . Difficulty sleeping   . Fractured hip ([place])    LEFT - AUG 2016  . GERD (gastroesophageal reflux disease)   . Hyperlipidemia   . Hypertension   . Melanoma ([place])   . Neuropathy   . Nocturia   . Osteopenia   . 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 PONV (postoperative nausea and vomiting)    PT STATES MORPHINE CAUSED N/V  . Rosacea   . Sleep apnea   . Stage 1 breast cancer, ER+, right ([place]) 07/04/2017    Past Surgical History:  Procedure Laterality Date  . [place] STUDY N/A 06/21/2018   Procedure: [place] STUDY;  Surgeon: Lavena Bullion, DO;  [facility]: [facility];  Service: Gastroenterology;  Laterality: N/A;  with impedance  . ABDOMINAL HYSTERECTOMY  2010  . APPENDECTOMY  1949  . [place]  . BREAST SURGERY    . CATARACT EXTRACTION Bilateral 2011  . CHOLECYSTECTOMY    . ESOPHAGEAL MANOMETRY N/A 06/21/2018   Procedure: ESOPHAGEAL MANOMETRY (EM);  Surgeon: Lavena Bullion, DO;  [facility];  Service: Gastroenterology;  Laterality: N/A;  . GALLBLADDER SURGERY  2015  . JOINT REPLACEMENT     RT TOTAL HIP / RT TOTAL KNEE  . MASTECTOMY  1998   BILATERAL   . [place] IMPEDANCE STUDY  06/21/2018   Procedure: [place] IMPEDANCE STUDY;  Surgeon: Lavena Bullion, DO;  [facility]: [facility];  Service: Gastroenterology;;  . SKIN CANCER EXCISION  2016   RT SIDE OF NOSE  . TONSILLECTOMY  1953  . TOTAL HIP ARTHROPLASTY  2010   RIGHT  . TOTAL HIP ARTHROPLASTY Left 05/09/2015   Procedure: LEFT TOTAL HIP ARTHROPLASTY ANTERIOR APPROACH;  Surgeon: Gaynelle Arabian, MD;  Location: [facility];  Service: Orthopedics;  Laterality: Left;  . TOTAL KNEE ARTHROPLASTY  2001  . TUMOR REMOVAL  2012   ABDOMINAL - NON CANCEROUS    Social History   Tobacco Use  . Smoking status: Former Smoker    Quit date: 02/03/1976    Years since quitting: 43.4  . Smokeless tobacco: Never Used  Substance Use Topics  . Alcohol use: No   . Drug use: No  Family History  Problem Relation Age of Onset  . Other Mother        complications from flu  . Other Father        unsure of cause  . Colon cancer Neg Hx     Allergies  Allergen Reactions  . Adhesive [Tape]   . Morphine And Related Nausea And Vomiting    Medication list has been reviewed and updated.  Current Outpatient Medications on File Prior to Visit  Medication Sig Dispense Refill  . AMBULATORY NON FORMULARY MEDICATION Take 10 mLs by mouth every 4 (four) hours as needed. Medication Name: Viscous Lidocaine, Bentyl, Maalox (equal parts) Take 33mL by mouth everey 4 hours as needed. 120 mL 3  . atorvastatin (LIPITOR) 20 MG tablet Take 1 tablet (20 mg total) by mouth daily. 90 tablet 1  . CALCIUM PO Take 1,200 mg by mouth daily.    . Cholecalciferol (VITAMIN D) 2000 units tablet Take 2,000 Units by mouth.    . cyclobenzaprine (FLEXERIL) 5 MG tablet TAKE ONE TABLET BY MOUTH AT BEDTIME AS NEEDED FOR MUSCLE SPASMS 90 tablet 2  . ergocalciferol (VITAMIN D2) 1.25 MG (50000 UT) capsule Take 1 capsule (50,000 Units total) by mouth once a week. 12 capsule 3  . HYDROcodone-acetaminophen (NORCO) 10-325 MG tablet Take 1 tablet by mouth every 6 (six) hours as needed. 120 tablet 0  . LINZESS 290 MCG CAPS capsule Take 1 capsule by mouth daily.    Marland Kitchen losartan (COZAAR) 25 MG tablet Take 1 tablet (25 mg total) by mouth daily. 90 tablet 1  . ondansetron (ZOFRAN-ODT) 4 MG disintegrating tablet as needed.    Marland Kitchen Plecanatide (TRULANCE) 3 MG TABS Take 3 mg by mouth daily. 90 tablet 5  . tobramycin-dexamethasone (TOBRADEX) ophthalmic solution tobramycin 0.3 %-dexamethasone 0.1 % eye drops,suspension  INSTILL 1 DROP INTO RIGHT EYE TWICE DAILY FOR 2 WEEKS     No current facility-administered medications on file prior to visit.    Review of Systems:  As per HPI- otherwise negative.   Physical Examination: Vitals:   07/11/19 1300  BP: 124/60  Pulse: 86  Resp: 17  Temp: (!)  96.3 F (35.7 C)  SpO2: 98%   Vitals:   07/11/19 1300  Weight: 230 lb (104.3 kg)  Height: 5\' 6"  (1.676 m)   Body mass index is 37.12 kg/m. Ideal Body Weight: Weight in (lb) to have BMI = 25: 154.6  GEN: no acute distress. Obese, otherwise looks well  HEENT: Atraumatic, Normocephalic.  Ears and Nose: No external deformity. CV: RRR, No M/G/R. No JVD. No thrill. No extra heart sounds. PULM: CTA B, no wheezes, crackles, rhonchi. No retractions. No resp. distress. No accessory muscle use. ABD: S, NT, ND, +BS. No rebound. No HSM.  Belly is benign EXTR: No c/c/e, normal gait for age PSYCH: Normally interactive. Conversant.  Uses walker    Assessment and Plan: Dyslipidemia - Plan: Ambulatory referral to Cardiology, atorvastatin (LIPITOR) 20 MG tablet, Lipid panel  Essential hypertension - Plan: losartan (COZAAR) 25 MG tablet  Rosacea - Plan: metroNIDAZOLE (METROGEL) 1 % gel  Frequent UTI - Plan: Urine Culture  Here today to follow-up after recent urinary tract infection.  Patient will bring [place] a urine culture from home-She is not able to urinate outside of her home  She had requested a referral to cardiology in spring 2020, this was delayed and then forgotten due to COVID-19.  I replaced referral today Continue Lipitor Blood pressure under good control, continue  losartan Refilled her metrogel used for rosacea  Will plan further follow- up pending labs. Moderate medical decision making today   tThis visit occurred during the SARS-CoV-2 public health emergency.  Safety protocols were in place, including screening questions prior to the visit, additional usage of staff PPE, and extensive cleaning of exam room while observing appropriate contact time as indicated for disinfecting solutions.    Signed Lamar Blinks, MD  Received her labs as below, letter to patient She is taking Lipitor Results for orders placed or performed in visit on 07/11/19  Lipid panel  Result Value Ref  Range   Cholesterol 105 0 - 200 mg/dL   Triglycerides 153.0 (H) 0.0 - 149.0 mg/dL   HDL 32.40 (L) >39.00 mg/dL   VLDL 30.6 0.0 - 40.0 mg/dL   LDL Cholesterol 42 0 - 99 mg/dL   Total CHOL/HDL Ratio 3    NonHDL 72.51    Received urine culture- negative for infection 2/12 Called but no answer and could not [facility]  Results for orders placed or performed in visit on 07/11/19  Urine Culture   Specimen: Urine  Result Value Ref Range   MICRO NUMBER: [id number]    SPECIMEN QUALITY: Adequate    Sample Source NOT GIVEN    STATUS: FINAL    ISOLATE 1:      Growth of mixed flora was isolated, suggesting probable contamination. No further testing will be performed. If clinically indicated, recollection using a method to minimize contamination, with prompt transfer to Urine Culture Transport Tube, is  recommended.   Lipid panel  Result Value Ref Range   Cholesterol 105 0 - 200 mg/dL   Triglycerides 153.0 (H) 0.0 - 149.0 mg/dL   HDL 32.40 (L) >39.00 mg/dL   VLDL 30.6 0.0 - 40.0 mg/dL   LDL Cholesterol 42 0 - 99 mg/dL   Total CHOL/HDL Ratio 3    NonHDL 72.51

## 2019-07-11 ENCOUNTER — Encounter: Payer: Self-pay | Admitting: Family Medicine

## 2019-07-11 ENCOUNTER — Other Ambulatory Visit: Payer: Self-pay

## 2019-07-11 ENCOUNTER — Ambulatory Visit (INDEPENDENT_AMBULATORY_CARE_PROVIDER_SITE_OTHER): Payer: Medicare Other | Admitting: Family Medicine

## 2019-07-11 VITALS — BP 124/60 | HR 86 | Temp 96.3°F | Resp 17 | Ht 66.0 in | Wt 230.0 lb

## 2019-07-11 DIAGNOSIS — E785 Hyperlipidemia, unspecified: Secondary | ICD-10-CM | POA: Diagnosis not present

## 2019-07-11 DIAGNOSIS — L719 Rosacea, unspecified: Secondary | ICD-10-CM

## 2019-07-11 DIAGNOSIS — N390 Urinary tract infection, site not specified: Secondary | ICD-10-CM

## 2019-07-11 DIAGNOSIS — I10 Essential (primary) hypertension: Secondary | ICD-10-CM | POA: Diagnosis not present

## 2019-07-11 LAB — LIPID PANEL
Cholesterol: 105 mg/dL (ref 0–200)
HDL: 32.4 mg/dL — ABNORMAL LOW (ref >39.00)
LDL Cholesterol: 42 mg/dL (ref 0–99)
NonHDL: 72.51
Total CHOL/HDL Ratio: 3
Triglycerides: 153 mg/dL — ABNORMAL HIGH (ref 0.0–149.0)
VLDL: 30.6 mg/dL (ref 0.0–40.0)

## 2019-07-11 MED ORDER — METRONIDAZOLE 1 % EX GEL: Freq: Every day | CUTANEOUS | 2 refills | Status: DC

## 2019-07-11 MED ORDER — LOSARTAN POTASSIUM 25 MG PO TABS: 25.0000 mg | ORAL_TABLET | Freq: Every day | ORAL | 3 refills | Status: DC

## 2019-07-11 MED ORDER — ATORVASTATIN CALCIUM 20 MG PO TABS: 20.0000 mg | ORAL_TABLET | Freq: Every day | ORAL | 3 refills | Status: DC

## 2019-07-11 NOTE — Patient Instructions (Signed)
It was good to see you again today I ordered a urine culture for you if you will being in a sample Please stop at the lab for a cholesterol panel today I refilled medications and re-did cardiology referral for you today

## 2019-07-12 ENCOUNTER — Other Ambulatory Visit: Payer: Self-pay

## 2019-07-12 ENCOUNTER — Encounter: Payer: Self-pay | Admitting: Registered Nurse

## 2019-07-12 ENCOUNTER — Encounter: Payer: Medicare Other | Attending: Physical Medicine & Rehabilitation | Admitting: Registered Nurse

## 2019-07-12 VITALS — BP 124/60 | HR 86 | Ht 66.0 in | Wt 230.0 lb

## 2019-07-12 DIAGNOSIS — M8580 Other specified disorders of bone density and structure, unspecified site: Secondary | ICD-10-CM | POA: Insufficient documentation

## 2019-07-12 DIAGNOSIS — I10 Essential (primary) hypertension: Secondary | ICD-10-CM | POA: Insufficient documentation

## 2019-07-12 DIAGNOSIS — Z5181 Encounter for therapeutic drug level monitoring: Secondary | ICD-10-CM | POA: Diagnosis not present

## 2019-07-12 DIAGNOSIS — M961 Postlaminectomy syndrome, not elsewhere classified: Secondary | ICD-10-CM | POA: Diagnosis not present

## 2019-07-12 DIAGNOSIS — Z8489 Family history of other specified conditions: Secondary | ICD-10-CM | POA: Insufficient documentation

## 2019-07-12 DIAGNOSIS — M62838 Other muscle spasm: Secondary | ICD-10-CM

## 2019-07-12 DIAGNOSIS — M79604 Pain in right leg: Secondary | ICD-10-CM | POA: Insufficient documentation

## 2019-07-12 DIAGNOSIS — Z96651 Presence of right artificial knee joint: Secondary | ICD-10-CM | POA: Insufficient documentation

## 2019-07-12 DIAGNOSIS — Z87891 Personal history of nicotine dependence: Secondary | ICD-10-CM | POA: Insufficient documentation

## 2019-07-12 DIAGNOSIS — M2550 Pain in unspecified joint: Secondary | ICD-10-CM | POA: Diagnosis not present

## 2019-07-12 DIAGNOSIS — S32000S Wedge compression fracture of unspecified lumbar vertebra, sequela: Secondary | ICD-10-CM | POA: Diagnosis not present

## 2019-07-12 DIAGNOSIS — Z96643 Presence of artificial hip joint, bilateral: Secondary | ICD-10-CM | POA: Insufficient documentation

## 2019-07-12 DIAGNOSIS — Z8673 Personal history of transient ischemic attack (TIA), and cerebral infarction without residual deficits: Secondary | ICD-10-CM | POA: Insufficient documentation

## 2019-07-12 DIAGNOSIS — R202 Paresthesia of skin: Secondary | ICD-10-CM | POA: Diagnosis not present

## 2019-07-12 DIAGNOSIS — Z79899 Other long term (current) drug therapy: Secondary | ICD-10-CM | POA: Insufficient documentation

## 2019-07-12 DIAGNOSIS — Z85820 Personal history of malignant melanoma of skin: Secondary | ICD-10-CM | POA: Insufficient documentation

## 2019-07-12 DIAGNOSIS — M79605 Pain in left leg: Secondary | ICD-10-CM | POA: Insufficient documentation

## 2019-07-12 DIAGNOSIS — G894 Chronic pain syndrome: Secondary | ICD-10-CM

## 2019-07-12 DIAGNOSIS — Z853 Personal history of malignant neoplasm of breast: Secondary | ICD-10-CM | POA: Insufficient documentation

## 2019-07-12 DIAGNOSIS — E785 Hyperlipidemia, unspecified: Secondary | ICD-10-CM | POA: Insufficient documentation

## 2019-07-12 DIAGNOSIS — K219 Gastro-esophageal reflux disease without esophagitis: Secondary | ICD-10-CM | POA: Insufficient documentation

## 2019-07-12 DIAGNOSIS — G4730 Sleep apnea, unspecified: Secondary | ICD-10-CM | POA: Insufficient documentation

## 2019-07-12 DIAGNOSIS — M170 Bilateral primary osteoarthritis of knee: Secondary | ICD-10-CM | POA: Diagnosis not present

## 2019-07-12 DIAGNOSIS — Z9889 Other specified postprocedural states: Secondary | ICD-10-CM | POA: Insufficient documentation

## 2019-07-12 DIAGNOSIS — M7918 Myalgia, other site: Secondary | ICD-10-CM | POA: Insufficient documentation

## 2019-07-12 DIAGNOSIS — G8929 Other chronic pain: Secondary | ICD-10-CM | POA: Insufficient documentation

## 2019-07-12 DIAGNOSIS — Z9013 Acquired absence of bilateral breasts and nipples: Secondary | ICD-10-CM | POA: Insufficient documentation

## 2019-07-12 DIAGNOSIS — M1712 Unilateral primary osteoarthritis, left knee: Secondary | ICD-10-CM | POA: Insufficient documentation

## 2019-07-12 DIAGNOSIS — Z9049 Acquired absence of other specified parts of digestive tract: Secondary | ICD-10-CM | POA: Insufficient documentation

## 2019-07-12 LAB — URINE CULTURE
MICRO NUMBER:: 10137520
SPECIMEN QUALITY:: ADEQUATE

## 2019-07-12 MED ORDER — HYDROCODONE-ACETAMINOPHEN 10-325 MG PO TABS: 1.0000 | ORAL_TABLET | Freq: Four times a day (QID) | ORAL | 0 refills | Status: DC | PRN

## 2019-07-12 NOTE — Progress Notes (Signed)
Subjective:    Patient ID: Crystal Lee, female    DOB: 1934-04-17, 86 y.o.   MRN: [id number]  HPI Haizley Leiker is a 86 y.o. female whose appointment was changed to a virtual office visit to reduce the risk of exposure to the COVID-19 virus and to help Ms. Bou remain healthy and safe. The virtual visit will also provide continuity of care. Ms. Coltrain agrees with virtual visit and verbalizes understanding.  She states her pain is located in her lower back, bilateral lower extremities and bilateral knee pain. She also reports generalized joint pain all over. She rates her pain 5. Her current exercise regime is walking.  Ms. Garma Morphine equivalent is  40.00 MME.  Last Oral Swab was Performed on 04/18/2019, it was consistent.    Pain Inventory Average Pain 7 Pain Right Now 5 My pain is constant, burning and aching  In the last 24 hours, has pain interfered with the following? General activity 8 Relation with others 3 Enjoyment of life 9 What TIME of day is your pain at its worst? evening Sleep (in general) Fair  Pain is worse with: walking and standing Pain improves with: rest and medication Relief from Meds: 7  Mobility walk with assistance use a cane use a walker do you drive?  yes  Function retired  Neuro/Psych bowel control problems numbness tremor trouble walking  Prior Studies Any changes since last visit?  no  Physicians involved in your care Any changes since last visit?  no   Family History  Problem Relation Age of Onset  . Other Mother        complications from flu  . Other Father        unsure of cause  . Colon cancer Neg Hx    Social History   Socioeconomic History  . Marital status: Married    Spouse name: Not on file  . Number of children: 3  . Years of education: 16 years  . Highest education level: Not on file  Occupational History  . Occupation: Retired  Tobacco Use  . Smoking status: Former Smoker    Quit date: 02/03/1976   Years since quitting: 43.4  . Smokeless tobacco: Never Used  Substance and Sexual Activity  . Alcohol use: No  . Drug use: No  . Sexual activity: Yes    Birth control/protection: Post-menopausal  Other Topics Concern  . Not on file  Social History Narrative   Lives at home with husband.   Right-handed.   No caffeine use.   Social Determinants of Health   Financial Resource Strain:   . Difficulty of Paying Living Expenses: Not on file  Food Insecurity:   . Worried About Charity fundraiser in the Last Year: Not on file  . Ran Out of Food in the Last Year: Not on file  Transportation Needs:   . Lack of Transportation (Medical): Not on file  . Lack of Transportation (Non-Medical): Not on file  Physical Activity:   . Days of Exercise per Week: Not on file  . Minutes of Exercise per Session: Not on file  Stress:   . Feeling of Stress : Not on file  Social Connections:   . Frequency of Communication with Friends and Family: Not on file  . Frequency of Social Gatherings with Friends and Family: Not on file  . Attends Religious Services: Not on file  . Active Member of Clubs or Organizations: Not on file  . Attends Archivist  Meetings: Not on file  . Marital Status: Not on file   Past Surgical History:  Procedure Laterality Date  . [place] STUDY N/A 06/21/2018   Procedure: [place] STUDY;  Surgeon: Lavena Bullion, DO;  [facility];  Service: Gastroenterology;  Laterality: N/A;  with impedance  . ABDOMINAL HYSTERECTOMY  2010  . APPENDECTOMY  1949  . [place]  . BREAST SURGERY    . CATARACT EXTRACTION Bilateral 2011  . CHOLECYSTECTOMY    . ESOPHAGEAL MANOMETRY N/A 06/21/2018   Procedure: ESOPHAGEAL MANOMETRY (EM);  Surgeon: Lavena Bullion, DO;  [facility];  Service: Gastroenterology;  Laterality: N/A;  . GALLBLADDER SURGERY  2015  . JOINT REPLACEMENT     RT TOTAL HIP / RT TOTAL KNEE  . MASTECTOMY  1998   BILATERAL   .  [place] IMPEDANCE STUDY  06/21/2018   Procedure: [place] IMPEDANCE STUDY;  Surgeon: Lavena Bullion, DO;  [facility];  Service: Gastroenterology;;  . SKIN CANCER EXCISION  2016   RT SIDE OF NOSE  . TONSILLECTOMY  1953  . TOTAL HIP ARTHROPLASTY  2010   RIGHT  . TOTAL HIP ARTHROPLASTY Left 05/09/2015   Procedure: LEFT TOTAL HIP ARTHROPLASTY ANTERIOR APPROACH;  Surgeon: Gaynelle Arabian, MD;  [facility];  Service: Orthopedics;  Laterality: Left;  . TOTAL KNEE ARTHROPLASTY  2001  . TUMOR REMOVAL  2012   ABDOMINAL - NON CANCEROUS   Past Medical History:  Diagnosis Date  . Arthritis   . Cancer (HCC)    HX BREAST CANCER/ SKIN CANCER  . Complication of anesthesia    N/V WITH MORPHINE  . Difficulty sleeping   . Fractured hip ([place])    LEFT - AUG 2016  . GERD (gastroesophageal reflux disease)   . Hyperlipidemia   . Hypertension   . Melanoma ([place])   . Neuropathy   . Nocturia   . Osteopenia   . 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 PONV (postoperative nausea and vomiting)    PT STATES MORPHINE CAUSED N/V  . Rosacea   . Sleep apnea   . Stage 1 breast cancer, ER+, right ([place]) 07/04/2017   Ht 5\' 6"  (1.676 m)   Wt 230 lb (104.3 kg)   BMI 37.12 kg/m   Opioid Risk Score:   Fall Risk Score:  `1  Depression screen PHQ 2/9  Depression screen [facility] 2/9 11/22/2018 09/28/2018 06/02/2018 04/10/2018 02/20/2018 10/17/2017  Decreased Interest 0 0 0 0 0 0  Down, Depressed, Hopeless 0 0 0 0 0 0  PHQ - 2 Score 0 0 0 0 0 0  Altered sleeping - - - - - 3  Tired, decreased energy - - - - - 3  Change in appetite - - - - - 0  Feeling bad or failure about yourself  - - - - - 0  Trouble concentrating - - - - - 0  Moving slowly or fidgety/restless - - - - - 0  Suicidal thoughts - - - - - 0  PHQ-9 Score - - - - - 6  Difficult doing work/chores - - - - - Somewhat difficult    Review of Systems  Constitutional: Negative.   HENT: Negative.   Eyes: Negative.   Respiratory: Negative.     Gastrointestinal: Positive for constipation.  Endocrine: Negative.   Genitourinary: Negative.   Musculoskeletal: Positive for arthralgias, back pain, gait problem and myalgias.  Skin: Negative.   Allergic/Immunologic:  Negative.   Neurological: Positive for tremors, weakness and numbness.       Tingling   Hematological: Negative.   Psychiatric/Behavioral: Negative.        Objective:   Physical Exam Vitals and nursing note reviewed.  Musculoskeletal:     Comments: No Physical Exam Performed: Virtual Visit           Assessment & Plan:  1. Chronic Bilateral Leg pain: Paresthesia: Continue to Monitor.07/12/2019 2.Paresthesia /Lumbar Radiculitis:Ms. Despres states she has weaned herself off the Lyrica due to daytime drowsiness. We will continue to monitor.07/12/2019 3. Pain of Left Wrist/: No complaints today. :S/P Carpal Tunnel Release on 06/03/2017 by Dr. Ellene Route. Dr. Ellene Route Following.07/12/2019. 4. Fracture of superior pubic ramus.Dr. AluisioFollowing.Continue to monitor.07/12/2019. 3.BilateralKnee OA: Continue Voltaren Gel.Continue to monitor. Orthopedist following.07/12/2019. 4. Polyarthralgia: Continue to alternate with heat and ice therapy. Continue current medication regime. Continue to monitor.07/12/2019. 5. Chronic Pain Syndrome:Refilled::Hydrocodone10/325 mg one tablet every 6 hours as needed for moderate pain #120.07/12/2019. 6. Lumbar Compression Fracture L2 and L3: Ms. Holland refused physical therapy.Continue with rest/ heat therapy. Continue to Monitor.07/12/2019. 7. Muscle Spasm: ContinueFlexeril 5 mg at HS. Continue to monitor.07/12/2019.  Tele-Health Visit Telephone Call Established patient Location of patient:Home Location of provider: Office Total Time Spent:10Minutes

## 2019-07-12 NOTE — Progress Notes (Signed)
Cardiology Office Note:    Date:  07/13/2019   ID:  Crystal Lee, DOB 02/15/1934, MRN [id number]  PCP:  Darreld Mclean, MD  Cardiologist:  Shirlee More, MD   Referring MD: Darreld Mclean, MD  ASSESSMENT:    1. Heart failure, type unknown ([place])   2. Essential hypertension   3. Dyslipidemia   4. Abnormal liver function test    PLAN:    In order of problems listed above:  1. Clinically with edema shortness of breath hypertensive heart disease she appears to have diastolic heart failure.  Will check labs including renal function proBNP to echocardiogram for systolic diastolic function initiate therapy with a low-dose loop diuretic and potassium and reassess in the office. 2. Stable BP at target continue ARB renal function potassium 3. We will continue her statin but I am concerned about liver function abnormalities of progressive will need to disrupt therapy and a statin for a period of time to see if she improves. 4. Recheck liver function today  Next appointment in 4 weeks   Medication Adjustments/Labs and Tests Ordered: Current medicines are reviewed at length with the patient today.  Concerns regarding medicines are outlined above.  Orders Placed This Encounter  Procedures  . Comprehensive metabolic panel  . Pro b natriuretic peptide (BNP)[facility])  . EKG 12-Lead  . ECHOCARDIOGRAM COMPLETE   Meds ordered this encounter  Medications  . furosemide (LASIX) 20 MG tablet    Sig: Take 1 tablet (20 mg total) by mouth daily.    Dispense:  90 tablet    Refill:  3  . potassium chloride (KLOR-CON) 10 MEQ tablet    Sig: Take 1 tablet (10 mEq total) by mouth daily.    Dispense:  90 tablet    Refill:  3     Chief Complaint  Patient presents with  . Shortness of Breath    History of Present Illness:    Crystal Lee is a 86 y.o. female with hypertension hyperlipidemia and breast cancer who is being seen today for the evaluation of  hyperlipidemia at the request of Copland, Gay Filler, MD. The referral since she is here for hyperlipidemia the patient tells me she has had a edema shortness of breath was have been seen earlier in 2020 and deferred due to COVID-19.  Also been noted to have abnormal liver function test on a statin and I will recheck them today  She has aortic atherosclerosis on CT chest 04/30/2019 that I personally reviewed. IMPRESSION: 1. Lymph nodes within 1-2 mm of measurements back April of 2019 remain nonspecific. Lobular hepatic contours and borderline splenomegaly raising the question of background liver disease which could explain at least the upper abdominal lymph nodes and may sometimes be associated with juxta diaphragmatic nodal enlargement. Low-grade lymphoproliferative disorder is also considered, attention on follow-up or PET scan evaluation may be helpful as warranted. 2. Right internal mammary lymph nodes stable since 06/18/2017, close attention on follow-up given the patient's history of breast cancer. This is categorized separately as nodal enlargement in this area would be atypical for other processes mentioned above. 3. No new enlarged lymph nodes in the chest, abdomen or pelvis. 4. Aortic atherosclerosis. Based on nodular hepatic contour and changes above, correlate with any clinical or laboratory evidence of liver disease. 5. Signs of bilateral mastectomy and axillary dissection. 6. Signs of previous left vertebroplasty at the L3 level.  Is on a statin and her lipid profile is ideal labs reviewed from  PCP office with  abnormal liver function tests.   She is originally from [place] reminds me a great deal of my mother-in-law recently passed.  She has had a history of lower extremity swelling shortness of breath with activities indoors and outdoors and sleeps with her head of the bed elevated on 2 pillows but will quite tell me she has orthopnea.  The symptoms are progressive and she is  worried she has heart disease she has longstanding hypertension but no history of congenital rheumatic heart disease atrial fibrillation or CAD.  She does not add salt to her diet.  Clinically she appears to have heart failure start on a low dose of loop diuretic potassium recheck a comprehensive panel for renal function potassium and liver function test and a proBNP level.  I discussed the need for an echocardiogram and she agrees and will be performed.  See her back in the office in 4 weeks in follow-up.  If EF is reduced she will need guideline directed therapy. Past Medical History:  Diagnosis Date  . Arthritis   . Cancer (HCC)    HX BREAST CANCER/ SKIN CANCER  . Complication of anesthesia    N/V WITH MORPHINE  . Difficulty sleeping   . Fractured hip ([place])    LEFT - AUG 2016  . GERD (gastroesophageal reflux disease)   . Hyperlipidemia   . Hypertension   . Melanoma ([place])   . Neuropathy   . Nocturia   . Osteopenia   . 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 PONV (postoperative nausea and vomiting)    PT STATES MORPHINE CAUSED N/V  . Rosacea   . Sleep apnea   . Stage 1 breast cancer, ER+, right ([place]) 07/04/2017    Past Surgical History:  Procedure Laterality Date  . [place] STUDY N/A 06/21/2018   Procedure: [place] STUDY;  Surgeon: Lavena Bullion, DO;  Location: [facility];  Service: Gastroenterology;  Laterality: N/A;  with impedance  . ABDOMINAL HYSTERECTOMY  2010  . APPENDECTOMY  1949  . [place]  . BREAST SURGERY    . CATARACT EXTRACTION Bilateral 2011  . CHOLECYSTECTOMY    . ESOPHAGEAL MANOMETRY N/A 06/21/2018   Procedure: ESOPHAGEAL MANOMETRY (EM);  Surgeon: Lavena Bullion, DO;  Location: [facility];  Service: Gastroenterology;  Laterality: N/A;  . GALLBLADDER SURGERY  2015  . JOINT REPLACEMENT     RT TOTAL HIP / RT TOTAL KNEE  . MASTECTOMY  1998   BILATERAL   . [place] IMPEDANCE STUDY  06/21/2018   Procedure: [place] IMPEDANCE STUDY;   Surgeon: Lavena Bullion, DO;  Location: [facility];  Service: Gastroenterology;;  . SKIN CANCER EXCISION  2016   RT SIDE OF NOSE  . TONSILLECTOMY  1953  . TOTAL HIP ARTHROPLASTY  2010   RIGHT  . TOTAL HIP ARTHROPLASTY Left 05/09/2015   Procedure: LEFT TOTAL HIP ARTHROPLASTY ANTERIOR APPROACH;  Surgeon: Gaynelle Arabian, MD;  Location: [facility];  Service: Orthopedics;  Laterality: Left;  . TOTAL KNEE ARTHROPLASTY  2001  . TUMOR REMOVAL  2012   ABDOMINAL - NON CANCEROUS    Current Medications: Current Meds  Medication Sig  . AMBULATORY NON FORMULARY MEDICATION Take 10 mLs by mouth every 4 (four) hours as needed. Medication Name: Viscous Lidocaine, Bentyl, Maalox (equal parts) Take 26mL by mouth everey 4 hours as needed.  Marland Kitchen atorvastatin (LIPITOR) 20 MG tablet Take 1 tablet (20 mg total) by mouth daily.  Marland Kitchen  CALCIUM PO Take 1,200 mg by mouth daily.  . Cholecalciferol (VITAMIN D) 2000 units tablet Take 2,000 Units by mouth.  . cyclobenzaprine (FLEXERIL) 5 MG tablet TAKE ONE TABLET BY MOUTH AT BEDTIME AS NEEDED FOR MUSCLE SPASMS  . ergocalciferol (VITAMIN D2) 1.25 MG (50000 UT) capsule Take 1 capsule (50,000 Units total) by mouth once a week.  Marland Kitchen HYDROcodone-acetaminophen (NORCO) 10-325 MG tablet Take 1 tablet by mouth every 6 (six) hours as needed.  Marland Kitchen losartan (COZAAR) 25 MG tablet Take 1 tablet (25 mg total) by mouth daily.  . metroNIDAZOLE (METROGEL) 1 % gel Apply topically daily. Use as needed for rosacea  . ondansetron (ZOFRAN-ODT) 4 MG disintegrating tablet as needed.  Marland Kitchen Plecanatide (TRULANCE) 3 MG TABS Take 3 mg by mouth daily.  . [DISCONTINUED] LINZESS 290 MCG CAPS capsule Take 1 capsule by mouth daily.     Allergies:   Adhesive [tape] and Morphine and related   Social History   Socioeconomic History  . Marital status: Married    Spouse name: Not on file  . Number of children: 3  . Years of education: 16 years  . Highest education level: Not on file  Occupational History  .  Occupation: Retired  Tobacco Use  . Smoking status: Former Smoker    Quit date: 02/03/1976    Years since quitting: 43.4  . Smokeless tobacco: Never Used  Substance and Sexual Activity  . Alcohol use: No  . Drug use: No  . Sexual activity: Yes    Birth control/protection: Post-menopausal  Other Topics Concern  . Not on file  Social History Narrative   Lives at home with husband.   Right-handed.   No caffeine use.   Social Determinants of Health   Financial Resource Strain:   . Difficulty of Paying Living Expenses: Not on file  Food Insecurity:   . Worried About Charity fundraiser in the Last Year: Not on file  . Ran Out of Food in the Last Year: Not on file  Transportation Needs:   . Lack of Transportation (Medical): Not on file  . Lack of Transportation (Non-Medical): Not on file  Physical Activity:   . Days of Exercise per Week: Not on file  . Minutes of Exercise per Session: Not on file  Stress:   . Feeling of Stress : Not on file  Social Connections:   . Frequency of Communication with Friends and Family: Not on file  . Frequency of Social Gatherings with Friends and Family: Not on file  . Attends Religious Services: Not on file  . Active Member of Clubs or Organizations: Not on file  . Attends Archivist Meetings: Not on file  . Marital Status: Not on file     Family History: The patient's family history includes Other in her father and mother. There is no history of Colon cancer.  ROS:   Review of Systems  Constitution: Negative.  HENT: Negative.   Eyes: Negative.   Cardiovascular: Positive for dyspnea on exertion, leg swelling and orthopnea.  Respiratory: Positive for shortness of breath.   Endocrine: Negative.   Hematologic/Lymphatic: Negative.   Skin: Negative.   Musculoskeletal: Negative.   Gastrointestinal: Negative.   Genitourinary: Negative.   Neurological: Negative.   Psychiatric/Behavioral: Negative.   Allergic/Immunologic: Negative.     Please see the history of present illness.     All other systems reviewed and are negative.  EKGs/Labs/Other Studies Reviewed:    The following studies were reviewed today:  EKG:  EKG is  ordered today.  The ekg ordered today is personally reviewed and demonstrates sinus rhythm left axis deviation 1 PVC nonspecific T waves  Recent Labs: 04/30/2019: ALT 55; BUN 12; Creatinine 0.70; Hemoglobin 13.4; Platelet Count 119; Potassium 4.6; Sodium 139  Recent Lipid Panel    Component Value Date/Time   CHOL 105 07/11/2019 1326   TRIG 153.0 (H) 07/11/2019 1326   HDL 32.40 (L) 07/11/2019 1326   CHOLHDL 3 07/11/2019 1326   VLDL 30.6 07/11/2019 1326   LDLCALC 42 07/11/2019 1326    Physical Exam:    VS:  BP 128/66   Pulse 79   Temp (!) 97.3 F (36.3 C)   Ht 5\' 6"  (1.676 m)   Wt 229 lb (103.9 kg)   SpO2 95%   BMI 36.96 kg/m     Wt Readings from Last 3 Encounters:  07/13/19 229 lb (103.9 kg)  07/12/19 230 lb (104.3 kg)  07/11/19 230 lb (104.3 kg)     GEN:  Well nourished, well developed in no acute distress HEENT: Normal NECK: No JVD; No carotid bruits LYMPHATICS: No lymphadenopathy CARDIAC: RRR, no murmurs, rubs, gallops RESPIRATORY:  Clear to auscultation without rales, wheezing or rhonchi  ABDOMEN: Soft, non-tender, non-distended MUSCULOSKELETAL:  No edema; No deformity  SKIN: Warm and dry NEUROLOGIC:  Alert and oriented x 3 PSYCHIATRIC:  Normal affect     Signed, Shirlee More, MD  07/13/2019 11:49 AM    [place]

## 2019-07-13 ENCOUNTER — Other Ambulatory Visit: Payer: Self-pay

## 2019-07-13 ENCOUNTER — Encounter: Payer: Self-pay | Admitting: Cardiology

## 2019-07-13 ENCOUNTER — Ambulatory Visit (INDEPENDENT_AMBULATORY_CARE_PROVIDER_SITE_OTHER): Payer: Medicare Other | Admitting: Cardiology

## 2019-07-13 ENCOUNTER — Other Ambulatory Visit: Payer: Self-pay | Admitting: Gastroenterology

## 2019-07-13 ENCOUNTER — Telehealth: Payer: Self-pay | Admitting: Gastroenterology

## 2019-07-13 VITALS — BP 128/66 | HR 79 | Temp 97.3°F | Ht 66.0 in | Wt 229.0 lb

## 2019-07-13 DIAGNOSIS — R945 Abnormal results of liver function studies: Secondary | ICD-10-CM

## 2019-07-13 DIAGNOSIS — I509 Heart failure, unspecified: Secondary | ICD-10-CM | POA: Diagnosis not present

## 2019-07-13 DIAGNOSIS — E785 Hyperlipidemia, unspecified: Secondary | ICD-10-CM | POA: Diagnosis not present

## 2019-07-13 DIAGNOSIS — I10 Essential (primary) hypertension: Secondary | ICD-10-CM

## 2019-07-13 DIAGNOSIS — R7989 Other specified abnormal findings of blood chemistry: Secondary | ICD-10-CM

## 2019-07-13 MED ORDER — LINZESS 290 MCG PO CAPS: 290.0000 ug | ORAL_CAPSULE | Freq: Every day | ORAL | 3 refills | Status: DC

## 2019-07-13 MED ORDER — FUROSEMIDE 20 MG PO TABS: 20.0000 mg | ORAL_TABLET | Freq: Every day | ORAL | 3 refills | Status: DC

## 2019-07-13 MED ORDER — POTASSIUM CHLORIDE ER 10 MEQ PO TBCR: 10.0000 meq | EXTENDED_RELEASE_TABLET | Freq: Every day | ORAL | 3 refills | Status: DC

## 2019-07-13 NOTE — Telephone Encounter (Signed)
Sent Linzess to [facility]

## 2019-07-13 NOTE — Patient Instructions (Signed)
Medication Instructions:  Your physician has recommended you make the following change in your medication:  1.  START Lasix 20 mg daily 2.  START Potassium 10 meq daily  *If you need a refill on your cardiac medications before your next appointment, please call your pharmacy*  Lab Work: TODAY:  CMP & PRO BNP  If you have labs (blood work) drawn today and your tests are completely normal, you will receive your results only by: Marland Kitchen MyChart Message (if you have MyChart) OR . A paper copy in the mail If you have any lab test that is abnormal or we need to change your treatment, we will call you to review the results.  Testing/Procedures: Your physician has requested that you have an echocardiogram. Echocardiography is a painless test that uses sound waves to create images of your heart. It provides your doctor with information about the size and shape of your heart and how well your heart's chambers and valves are working. This procedure takes approximately one hour. There are no restrictions for this procedure.    Follow-Up: At [facility], you and your health needs are our priority.  As part of our continuing mission to provide you with exceptional heart care, we have created designated Provider Care Teams.  These Care Teams include your primary Cardiologist (physician) and Advanced Practice Providers (APPs -  Physician Assistants and Nurse Practitioners) who all work together to provide you with the care you need, when you need it.  Your next appointment:   4 week(s)  The format for your next appointment:   In Person  Provider:   Shirlee More, MD  Other Instructions  Echocardiogram An echocardiogram is a procedure that uses painless sound waves (ultrasound) to produce an image of the heart. Images from an echocardiogram can provide important information about:  Signs of coronary artery disease (CAD).  Aneurysm detection. An aneurysm is a weak or damaged part of an artery wall  that bulges out from the normal force of blood pumping through the body.  Heart size and shape. Changes in the size or shape of the heart can be associated with certain conditions, including heart failure, aneurysm, and CAD.  Heart muscle function.  Heart valve function.  Signs of a past heart attack.  Fluid buildup around the heart.  Thickening of the heart muscle.  A tumor or infectious growth around the heart valves. Tell a health care provider about:  Any allergies you have.  All medicines you are taking, including vitamins, herbs, eye drops, creams, and over-the-counter medicines.  Any blood disorders you have.  Any surgeries you have had.  Any medical conditions you have.  Whether you are pregnant or may be pregnant. What are the risks? Generally, this is a safe procedure. However, problems may occur, including:  Allergic reaction to dye (contrast) that may be used during the procedure. What happens before the procedure? No specific preparation is needed. You may eat and drink normally. What happens during the procedure?   An IV tube may be inserted into one of your veins.  You may receive contrast through this tube. A contrast is an injection that improves the quality of the pictures from your heart.  A gel will be applied to your chest.  A wand-like tool (transducer) will be moved over your chest. The gel will help to transmit the sound waves from the transducer.  The sound waves will harmlessly bounce off of your heart to allow the heart images to be captured  in real-time motion. The images will be recorded on a computer. The procedure may vary among health care providers and hospitals. What happens after the procedure?  You may return to your normal, everyday life, including diet, activities, and medicines, unless your health care provider tells you not to do that. Summary  An echocardiogram is a procedure that uses painless sound waves (ultrasound) to  produce an image of the heart.  Images from an echocardiogram can provide important information about the size and shape of your heart, heart muscle function, heart valve function, and fluid buildup around your heart.  You do not need to do anything to prepare before this procedure. You may eat and drink normally.  After the echocardiogram is completed, you may return to your normal, everyday life, unless your health care provider tells you not to do that. This information is not intended to replace advice given to you by your health care provider. Make sure you discuss any questions you have with your health care provider. Document Revised: 09/07/2018 Document Reviewed: 06/19/2016 Elsevier Patient Education  [place].

## 2019-07-14 LAB — PRO B NATRIURETIC PEPTIDE: NT-Pro BNP: 74 pg/mL (ref 0–738)

## 2019-07-24 ENCOUNTER — Ambulatory Visit (HOSPITAL_BASED_OUTPATIENT_CLINIC_OR_DEPARTMENT_OTHER)
Admission: RE | Admit: 2019-07-24 | Discharge: 2019-07-24 | Disposition: A | Discharge status: Home / Self Care | Payer: Medicare Other | Source: Ambulatory Visit | Attending: Cardiology | Admitting: Cardiology

## 2019-07-24 DIAGNOSIS — E785 Hyperlipidemia, unspecified: Secondary | ICD-10-CM | POA: Insufficient documentation

## 2019-07-24 DIAGNOSIS — I110 Hypertensive heart disease with heart failure: Secondary | ICD-10-CM | POA: Insufficient documentation

## 2019-07-24 DIAGNOSIS — I509 Heart failure, unspecified: Secondary | ICD-10-CM | POA: Insufficient documentation

## 2019-07-24 NOTE — Progress Notes (Signed)
  Echocardiogram 2D Echocardiogram has been performed.  Cardell Peach 07/24/2019, 9:31 AM

## 2019-07-25 ENCOUNTER — Telehealth: Payer: Self-pay

## 2019-07-25 NOTE — Telephone Encounter (Signed)
Attempted to call patient, no answer and unable to leave a message as her voicemail box has not been set up yet.

## 2019-07-25 NOTE — Telephone Encounter (Signed)
-----   Message from Richardo Priest, MD sent at 07/25/2019  7:31 AM EST ----- Normal or stable result  Results heart muscle function is normal not uncommon with fluid retention and I would continue her diuretic

## 2019-08-01 ENCOUNTER — Telehealth: Payer: Self-pay | Admitting: Cardiology

## 2019-08-01 NOTE — Telephone Encounter (Signed)
Follow Up:     Pt is returning call from yesterday, concerning her Echo results.

## 2019-08-01 NOTE — Telephone Encounter (Signed)
Attempted to call patient. No answer, unable to leave a message.

## 2019-08-02 NOTE — Telephone Encounter (Signed)
I tried calling patient, no answer, no VM.  08/02/19

## 2019-08-06 NOTE — Telephone Encounter (Signed)
I mailed Echo results 08/06/19

## 2019-08-07 DIAGNOSIS — N3020 Other chronic cystitis without hematuria: Secondary | ICD-10-CM | POA: Diagnosis not present

## 2019-08-09 ENCOUNTER — Other Ambulatory Visit: Payer: Self-pay

## 2019-08-09 ENCOUNTER — Encounter: Payer: Medicare Other | Attending: Physical Medicine & Rehabilitation | Admitting: Registered Nurse

## 2019-08-09 ENCOUNTER — Encounter: Payer: Self-pay | Admitting: Registered Nurse

## 2019-08-09 VITALS — BP 128/75 | HR 79 | Temp 97.9°F | Ht 66.0 in | Wt 229.0 lb

## 2019-08-09 DIAGNOSIS — M8580 Other specified disorders of bone density and structure, unspecified site: Secondary | ICD-10-CM | POA: Insufficient documentation

## 2019-08-09 DIAGNOSIS — Z9013 Acquired absence of bilateral breasts and nipples: Secondary | ICD-10-CM | POA: Diagnosis not present

## 2019-08-09 DIAGNOSIS — M1712 Unilateral primary osteoarthritis, left knee: Secondary | ICD-10-CM | POA: Insufficient documentation

## 2019-08-09 DIAGNOSIS — Z87891 Personal history of nicotine dependence: Secondary | ICD-10-CM | POA: Diagnosis not present

## 2019-08-09 DIAGNOSIS — M961 Postlaminectomy syndrome, not elsewhere classified: Secondary | ICD-10-CM | POA: Diagnosis not present

## 2019-08-09 DIAGNOSIS — Z5181 Encounter for therapeutic drug level monitoring: Secondary | ICD-10-CM | POA: Diagnosis not present

## 2019-08-09 DIAGNOSIS — G4730 Sleep apnea, unspecified: Secondary | ICD-10-CM | POA: Insufficient documentation

## 2019-08-09 DIAGNOSIS — Z853 Personal history of malignant neoplasm of breast: Secondary | ICD-10-CM | POA: Insufficient documentation

## 2019-08-09 DIAGNOSIS — M79605 Pain in left leg: Secondary | ICD-10-CM | POA: Diagnosis not present

## 2019-08-09 DIAGNOSIS — Z96643 Presence of artificial hip joint, bilateral: Secondary | ICD-10-CM | POA: Insufficient documentation

## 2019-08-09 DIAGNOSIS — R202 Paresthesia of skin: Secondary | ICD-10-CM

## 2019-08-09 DIAGNOSIS — Z85820 Personal history of malignant melanoma of skin: Secondary | ICD-10-CM | POA: Diagnosis not present

## 2019-08-09 DIAGNOSIS — Z8489 Family history of other specified conditions: Secondary | ICD-10-CM | POA: Diagnosis not present

## 2019-08-09 DIAGNOSIS — Z79899 Other long term (current) drug therapy: Secondary | ICD-10-CM | POA: Diagnosis not present

## 2019-08-09 DIAGNOSIS — M170 Bilateral primary osteoarthritis of knee: Secondary | ICD-10-CM

## 2019-08-09 DIAGNOSIS — H5712 Ocular pain, left eye: Secondary | ICD-10-CM | POA: Diagnosis not present

## 2019-08-09 DIAGNOSIS — Z96651 Presence of right artificial knee joint: Secondary | ICD-10-CM | POA: Insufficient documentation

## 2019-08-09 DIAGNOSIS — I10 Essential (primary) hypertension: Secondary | ICD-10-CM | POA: Diagnosis not present

## 2019-08-09 DIAGNOSIS — Z8673 Personal history of transient ischemic attack (TIA), and cerebral infarction without residual deficits: Secondary | ICD-10-CM | POA: Diagnosis not present

## 2019-08-09 DIAGNOSIS — Z9889 Other specified postprocedural states: Secondary | ICD-10-CM | POA: Diagnosis not present

## 2019-08-09 DIAGNOSIS — M7918 Myalgia, other site: Secondary | ICD-10-CM | POA: Insufficient documentation

## 2019-08-09 DIAGNOSIS — G894 Chronic pain syndrome: Secondary | ICD-10-CM | POA: Diagnosis not present

## 2019-08-09 DIAGNOSIS — M79604 Pain in right leg: Secondary | ICD-10-CM | POA: Diagnosis not present

## 2019-08-09 DIAGNOSIS — E785 Hyperlipidemia, unspecified: Secondary | ICD-10-CM | POA: Insufficient documentation

## 2019-08-09 DIAGNOSIS — Z9049 Acquired absence of other specified parts of digestive tract: Secondary | ICD-10-CM | POA: Diagnosis not present

## 2019-08-09 DIAGNOSIS — S32000S Wedge compression fracture of unspecified lumbar vertebra, sequela: Secondary | ICD-10-CM

## 2019-08-09 DIAGNOSIS — K219 Gastro-esophageal reflux disease without esophagitis: Secondary | ICD-10-CM | POA: Insufficient documentation

## 2019-08-09 DIAGNOSIS — G8929 Other chronic pain: Secondary | ICD-10-CM | POA: Insufficient documentation

## 2019-08-09 DIAGNOSIS — M2550 Pain in unspecified joint: Secondary | ICD-10-CM

## 2019-08-09 DIAGNOSIS — M62838 Other muscle spasm: Secondary | ICD-10-CM | POA: Diagnosis not present

## 2019-08-09 MED ORDER — HYDROCODONE-ACETAMINOPHEN 10-325 MG PO TABS: 1.0000 | ORAL_TABLET | Freq: Four times a day (QID) | ORAL | 0 refills | Status: DC | PRN

## 2019-08-09 NOTE — Progress Notes (Signed)
Subjective:    Patient ID: Crystal Lee, female    DOB: 08-28-1933, 86 y.o.   MRN: [id number]  HPI: Crystal Lee is a 86 y.o. female who returns for follow up appointment for chronic pain and medication refill. She states her pain is located in her mid- lower back pain, bilateral lower extremities and reports she has generalized pain all over. Also reports she has left eye pain no drainage noted, onset was a year ago and resolved. The pain has return for the last three months, she was instructed to call her ophthalmologist, she verbalizes understanding.  She rates her pain 5. Her current exercise regime is walking.  Crystal Lee Morphine equivalent is 40.00  MME.  Oral Swab was Performed Today.   Pain Inventory Average Pain 6 Pain Right Now 5 My pain is sharp, burning, tingling and aching  In the last 24 hours, has pain interfered with the following? General activity 6 Relation with others 2 Enjoyment of life 4 What TIME of day is your pain at its worst? evening Sleep (in general) Poor  Pain is worse with: walking, bending, standing and some activites Pain improves with: rest, heat/ice and medication Relief from Meds: .  Mobility walk with assistance use a cane use a walker ability to climb steps?  no do you drive?  yes  Function retired I need assistance with the following:  household duties and shopping  Neuro/Psych trouble walking  Prior Studies Any changes since last visit?  no  Physicians involved in your care Any changes since last visit?  no   Family History  Problem Relation Age of Onset  . Other Mother        complications from flu  . Other Father        unsure of cause  . Colon cancer Neg Hx    Social History   Socioeconomic History  . Marital status: Married    Spouse name: Not on file  . Number of children: 3  . Years of education: 16 years  . Highest education level: Not on file  Occupational History  . Occupation: Retired  Tobacco Use  .  Smoking status: Former Smoker    Quit date: 02/03/1976    Years since quitting: 43.5  . Smokeless tobacco: Never Used  Substance and Sexual Activity  . Alcohol use: No  . Drug use: No  . Sexual activity: Yes    Birth control/protection: Post-menopausal  Other Topics Concern  . Not on file  Social History Narrative   Lives at home with husband.   Right-handed.   No caffeine use.   Social Determinants of Health   Financial Resource Strain:   . Difficulty of Paying Living Expenses:   Food Insecurity:   . Worried About Charity fundraiser in the Last Year:   . Arboriculturist in the Last Year:   Transportation Needs:   . Film/video editor (Medical):   Marland Kitchen Lack of Transportation (Non-Medical):   Physical Activity:   . Days of Exercise per Week:   . Minutes of Exercise per Session:   Stress:   . Feeling of Stress :   Social Connections:   . Frequency of Communication with Friends and Family:   . Frequency of Social Gatherings with Friends and Family:   . Attends Religious Services:   . Active Member of Clubs or Organizations:   . Attends Archivist Meetings:   Marland Kitchen Marital Status:    Past Surgical  History:  Procedure Laterality Date  . [place] STUDY N/A 06/21/2018   Procedure: [place] STUDY;  Surgeon: Lavena Bullion, DO;  Location: [facility];  Service: Gastroenterology;  Laterality: N/A;  with impedance  . ABDOMINAL HYSTERECTOMY  2010  . APPENDECTOMY  1949  . [place]  . BREAST SURGERY    . CATARACT EXTRACTION Bilateral 2011  . CHOLECYSTECTOMY    . ESOPHAGEAL MANOMETRY N/A 06/21/2018   Procedure: ESOPHAGEAL MANOMETRY (EM);  Surgeon: Lavena Bullion, DO;  [facility];  Service: Gastroenterology;  Laterality: N/A;  . GALLBLADDER SURGERY  2015  . JOINT REPLACEMENT     RT TOTAL HIP / RT TOTAL KNEE  . MASTECTOMY  1998   BILATERAL   . [place] IMPEDANCE STUDY  06/21/2018   Procedure: [place] IMPEDANCE STUDY;  Surgeon: Lavena Bullion, DO;   [facility]: [facility];  Service: Gastroenterology;;  . SKIN CANCER EXCISION  2016   RT SIDE OF NOSE  . TONSILLECTOMY  1953  . TOTAL HIP ARTHROPLASTY  2010   RIGHT  . TOTAL HIP ARTHROPLASTY Left 05/09/2015   Procedure: LEFT TOTAL HIP ARTHROPLASTY ANTERIOR APPROACH;  Surgeon: Gaynelle Arabian, MD;  Location: [facility];  Service: Orthopedics;  Laterality: Left;  . TOTAL KNEE ARTHROPLASTY  2001  . TUMOR REMOVAL  2012   ABDOMINAL - NON CANCEROUS   Past Medical History:  Diagnosis Date  . Arthritis   . Cancer (HCC)    HX BREAST CANCER/ SKIN CANCER  . Complication of anesthesia    N/V WITH MORPHINE  . Difficulty sleeping   . Fractured hip ([place])    LEFT - AUG 2016  . GERD (gastroesophageal reflux disease)   . Hyperlipidemia   . Hypertension   . Melanoma ([place])   . Neuropathy   . Nocturia   . Osteopenia   . 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 PONV (postoperative nausea and vomiting)    PT STATES MORPHINE CAUSED N/V  . Rosacea   . Sleep apnea   . Stage 1 breast cancer, ER+, right (HCC) 07/04/2017   BP 128/75   Pulse 79   Temp 97.9 F (36.6 C)   Ht 5\' 6"  (1.676 m)   Wt 229 lb (103.9 kg)   SpO2 93%   BMI 36.96 kg/m   Opioid Risk Score:   Fall Risk Score:  `1  Depression screen PHQ 2/9  Depression screen [facility] 2/9 11/22/2018 09/28/2018 06/02/2018 04/10/2018 02/20/2018 10/17/2017  Decreased Interest 0 0 0 0 0 0  Down, Depressed, Hopeless 0 0 0 0 0 0  PHQ - 2 Score 0 0 0 0 0 0  Altered sleeping - - - - - 3  Tired, decreased energy - - - - - 3  Change in appetite - - - - - 0  Feeling bad or failure about yourself  - - - - - 0  Trouble concentrating - - - - - 0  Moving slowly or fidgety/restless - - - - - 0  Suicidal thoughts - - - - - 0  PHQ-9 Score - - - - - 6  Difficult doing work/chores - - - - - Somewhat difficult     Review of Systems  Constitutional: Negative.   HENT: Negative.   Eyes: Negative.   Respiratory: Negative.   Cardiovascular: Positive for leg  swelling.  Gastrointestinal: Positive for constipation.  Endocrine: Negative.   Genitourinary: Negative.   Musculoskeletal: Positive for arthralgias, back pain  and gait problem.  Skin: Negative.   Allergic/Immunologic: Negative.   Hematological: Negative.   Psychiatric/Behavioral: Negative.   All other systems reviewed and are negative.      Objective:   Physical Exam Vitals and nursing note reviewed.  Constitutional:      Appearance: Normal appearance.  Cardiovascular:     Rate and Rhythm: Normal rate and regular rhythm.     Pulses: Normal pulses.     Heart sounds: Normal heart sounds.  Pulmonary:     Effort: Pulmonary effort is normal.     Breath sounds: Normal breath sounds.  Musculoskeletal:     Cervical back: Normal range of motion and neck supple.     Comments: Normal Muscle Bulk and Muscle Testing Reveals:  Upper Extremities: Full ROM and Muscle Strength 5/5 , Lumbar Paraspinal Tenderness: L-3-L-5 Lower Extremities: Full ROM and Muscle Strength 5/5 Arises from Chair Slowly Narrow Based  Gait   Skin:    General: Skin is warm and dry.  Neurological:     Mental Status: She is alert and oriented to person, place, and time.  Psychiatric:        Mood and Affect: Mood normal.        Behavior: Behavior normal.           Assessment & Plan:  1. Chronic Bilateral Leg pain: Paresthesia: Continue to Monitor.08/09/2019 2.Paresthesia /Lumbar Radiculitis:Ms. Shovlin reported she weaned herself off the Lyrica due to daytime drowsiness. We will continue to monitor.08/09/2019 3. Pain of Left Wrist/:No complaints today.:S/P Carpal Tunnel Release on 06/03/2017 by Dr. Ellene Route. Dr. Ellene Route Following.08/09/2019. 4. Fracture of superior pubic ramus.Dr. AluisioFollowing.Continue to monitor.08/09/2019. 3.BilateralKnee OA: Continue Voltaren Gel.Continue to monitor. Orthopedist following.08/09/2019. 4. Polyarthralgia: Continue to alternate with heat and ice therapy.  Continue current medication regime. Continue to monitor.08/09/2019. 5. Chronic Pain Syndrome:Refilled::Hydrocodone10/325 mg one tablet every 6 hours as needed for moderate pain #120.08/09/2019. 6. Lumbar Compression Fracture L2 and L3: Ms. Fredrick refused physical therapy.Continue with rest/ heat therapy. Continue to Monitor.08/09/2019. 7. Muscle Spasm: ContinueFlexeril 5 mg at HS. Continue to monitor.08/09/2019.  F/U in 1 Month

## 2019-08-10 ENCOUNTER — Ambulatory Visit (INDEPENDENT_AMBULATORY_CARE_PROVIDER_SITE_OTHER): Payer: Medicare Other | Admitting: Cardiology

## 2019-08-10 ENCOUNTER — Encounter: Payer: Self-pay | Admitting: Cardiology

## 2019-08-10 VITALS — BP 128/64 | HR 77 | Temp 98.0°F | Ht 66.0 in | Wt 229.0 lb

## 2019-08-10 DIAGNOSIS — E785 Hyperlipidemia, unspecified: Secondary | ICD-10-CM | POA: Diagnosis not present

## 2019-08-10 DIAGNOSIS — R0602 Shortness of breath: Secondary | ICD-10-CM

## 2019-08-10 DIAGNOSIS — I10 Essential (primary) hypertension: Secondary | ICD-10-CM | POA: Diagnosis not present

## 2019-08-10 NOTE — Patient Instructions (Signed)
Medication Instructions:  Your physician recommends that you continue on your current medications as directed. Please refer to the Current Medication list given to you today.  *If you need a refill on your cardiac medications before your next appointment, please call your pharmacy*   Lab Work: BMET & TSH  If you have labs (blood work) drawn today and your tests are completely normal, you will receive your results only by: Marland Kitchen MyChart Message (if you have MyChart) OR . A paper copy in the mail If you have any lab test that is abnormal or we need to change your treatment, we will call you to review the results.   Testing/Procedures: None ordered   Follow-Up: At [facility], you and your health needs are our priority.  As part of our continuing mission to provide you with exceptional heart care, we have created designated Provider Care Teams.  These Care Teams include your primary Cardiologist (physician) and Advanced Practice Providers (APPs -  Physician Assistants and Nurse Practitioners) who all work together to provide you with the care you need, when you need it.  We recommend signing up for the patient portal called "MyChart".  Sign up information is provided on this After Visit Summary.  MyChart is used to connect with patients for Virtual Visits (Telemedicine).  Patients are able to view lab/test results, encounter notes, upcoming appointments, etc.  Non-urgent messages can be sent to your provider as well.   To learn more about what you can do with MyChart, go to [email or web address].    Your next appointment:   6 month(s)  The format for your next appointment:   In Person  Provider:   Shirlee More, MD  Other Instructions

## 2019-08-10 NOTE — Progress Notes (Signed)
Cardiology Office Note:    Date:  08/10/2019   ID:  Crystal Lee, DOB 09/23/1933, MRN [id number]  PCP:  Darreld Mclean, MD  Cardiologist:  Shirlee More, MD    Referring MD: Darreld Mclean, MD    ASSESSMENT:    1. Shortness of breath   2. Essential hypertension   3. Dyslipidemia    PLAN:    In order of problems listed above:  1. Improved continue low-dose diuretic in my opinion she has hypertensive heart disease with heart failure and some of the phenotypes have very low BNP levels.  With her malaise and fatigue we need to check labs including TSH BMP and continue her current statin.   Next appointment: 6 months   Medication Adjustments/Labs and Tests Ordered: Current medicines are reviewed at length with the patient today.  Concerns regarding medicines are outlined above.  No orders of the defined types were placed in this encounter.  No orders of the defined types were placed in this encounter.   Chief Complaint  Patient presents with  . Follow-up  . Shortness of Breath    History of Present Illness:    Crystal Lee is a 86 y.o. female with a hx of hypertension hyperlipidemia and breast cancer    last seen 07/12/2018.  At that time she had complaints of shortness of breath and peripheral edema and was initiated on a diuretic Compliance with diet, lifestyle and medications: Yes  Some ways better less edema shortness of breath but her malaise and fatigue is worsened.  No chest pain orthopnea palpitation or syncope.  Her last TSH was September 2018 normal and she has a hypothyroid appearance.  She is at risk for hyponatremia hypokalemia and will check BMP today along with TSH.  For now would continue her diuretic and hypertension is controlled   Ref Range & Units 4 wk ago  NT-Pro BNP 0 - 738 pg/mL 74     Echo 07/24/2019: 1. Left ventricular ejection fraction, by estimation, is 60 to 65%. The  left ventricle has normal function. The left ventricle has no  regional  wall motion abnormalities. There is mild left ventricular hypertrophy.  Left ventricular diastolic parameters  were normal.  2. The mitral valve is normal in structure and function. No evidence of  mitral valve regurgitation. No evidence of mitral stenosis.  3. The aortic valve is normal in structure and function. Aortic valve  regurgitation is not visualized. No aortic stenosis is present.  4. There is mild dilatation of the ascending aorta measuring 38 mm.  5. The inferior vena cava is normal in size with greater than 50%  respiratory variability, suggesting right atrial pressure of 3 mmHg. Past Medical History:  Diagnosis Date  . Arthritis   . Cancer (HCC)    HX BREAST CANCER/ SKIN CANCER  . Complication of anesthesia    N/V WITH MORPHINE  . Difficulty sleeping   . Fractured hip ([place])    LEFT - AUG 2016  . GERD (gastroesophageal reflux disease)   . Hyperlipidemia   . Hypertension   . Melanoma ([place])   . Neuropathy   . Nocturia   . Osteopenia   . 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 PONV (postoperative nausea and vomiting)    PT STATES MORPHINE CAUSED N/V  . Rosacea   . Sleep apnea   . Stage 1 breast cancer, ER+, right ([place]) 07/04/2017    Past Surgical History:  Procedure Laterality Date  . 24  HOUR [place] STUDY N/A 06/21/2018   Procedure: 24 HOUR [place] STUDY;  Surgeon: Lavena Bullion, DO;  [facility];  Service: Gastroenterology;  Laterality: N/A;  with impedance  . ABDOMINAL HYSTERECTOMY  2010  . APPENDECTOMY  1949  . [place]  . BREAST SURGERY    . CATARACT EXTRACTION Bilateral 2011  . CHOLECYSTECTOMY    . ESOPHAGEAL MANOMETRY N/A 06/21/2018   Procedure: ESOPHAGEAL MANOMETRY (EM);  Surgeon: Lavena Bullion, DO;  [facility];  Service: Gastroenterology;  Laterality: N/A;  . GALLBLADDER SURGERY  2015  . JOINT REPLACEMENT     RT TOTAL HIP / RT TOTAL KNEE  . MASTECTOMY  1998   BILATERAL   . [place] IMPEDANCE STUDY   06/21/2018   Procedure: [place] IMPEDANCE STUDY;  Surgeon: Lavena Bullion, DO;  [facility];  Service: Gastroenterology;;  . SKIN CANCER EXCISION  2016   RT SIDE OF NOSE  . TONSILLECTOMY  1953  . TOTAL HIP ARTHROPLASTY  2010   RIGHT  . TOTAL HIP ARTHROPLASTY Left 05/09/2015   Procedure: LEFT TOTAL HIP ARTHROPLASTY ANTERIOR APPROACH;  Surgeon: Gaynelle Arabian, MD;  [facility];  Service: Orthopedics;  Laterality: Left;  . TOTAL KNEE ARTHROPLASTY  2001  . TUMOR REMOVAL  2012   ABDOMINAL - NON CANCEROUS    Current Medications: Current Meds  Medication Sig  . AMBULATORY NON FORMULARY MEDICATION Take 10 mLs by mouth every 4 (four) hours as needed. Medication Name: Viscous Lidocaine, Bentyl, Maalox (equal parts) Take 31mL by mouth everey 4 hours as needed.  Marland Kitchen atorvastatin (LIPITOR) 20 MG tablet Take 1 tablet (20 mg total) by mouth daily.  Marland Kitchen CALCIUM PO Take 1,200 mg by mouth daily.  . Cholecalciferol (VITAMIN D) 2000 units tablet Take 2,000 Units by mouth.  . cyclobenzaprine (FLEXERIL) 5 MG tablet TAKE ONE TABLET BY MOUTH AT BEDTIME AS NEEDED FOR MUSCLE SPASMS  . ergocalciferol (VITAMIN D2) 1.25 MG (50000 UT) capsule Take 1 capsule (50,000 Units total) by mouth once a week.  . furosemide (LASIX) 20 MG tablet Take 1 tablet (20 mg total) by mouth daily.  Marland Kitchen HYDROcodone-acetaminophen (NORCO) 10-325 MG tablet Take 1 tablet by mouth every 6 (six) hours as needed.  Marland Kitchen LINZESS 290 MCG CAPS capsule Take 1 capsule (290 mcg total) by mouth daily.  Marland Kitchen losartan (COZAAR) 25 MG tablet Take 1 tablet (25 mg total) by mouth daily.  . metroNIDAZOLE (METROGEL) 1 % gel Apply topically daily. Use as needed for rosacea  . ondansetron (ZOFRAN-ODT) 4 MG disintegrating tablet as needed.  Marland Kitchen Plecanatide (TRULANCE) 3 MG TABS Take 3 mg by mouth daily.  . potassium chloride (KLOR-CON) 10 MEQ tablet Take 1 tablet (10 mEq total) by mouth daily.     Allergies:   Adhesive [tape] and Morphine and related   Social  History   Socioeconomic History  . Marital status: Married    Spouse name: Not on file  . Number of children: 3  . Years of education: 16 years  . Highest education level: Not on file  Occupational History  . Occupation: Retired  Tobacco Use  . Smoking status: Former Smoker    Quit date: 02/03/1976    Years since quitting: 43.5  . Smokeless tobacco: Never Used  Substance and Sexual Activity  . Alcohol use: No  . Drug use: No  . Sexual activity: Yes    Birth control/protection: Post-menopausal  Other Topics Concern  . Not on file  Social  History Narrative   Lives at home with husband.   Right-handed.   No caffeine use.   Social Determinants of Health   Financial Resource Strain:   . Difficulty of Paying Living Expenses:   Food Insecurity:   . Worried About Charity fundraiser in the Last Year:   . Arboriculturist in the Last Year:   Transportation Needs:   . Film/video editor (Medical):   Marland Kitchen Lack of Transportation (Non-Medical):   Physical Activity:   . Days of Exercise per Week:   . Minutes of Exercise per Session:   Stress:   . Feeling of Stress :   Social Connections:   . Frequency of Communication with Friends and Family:   . Frequency of Social Gatherings with Friends and Family:   . Attends Religious Services:   . Active Member of Clubs or Organizations:   . Attends Archivist Meetings:   Marland Kitchen Marital Status:      Family History: The patient's family history includes Other in her father and mother. There is no history of Colon cancer. ROS:   Please see the history of present illness.    All other systems reviewed and are negative.  EKGs/Labs/Other Studies Reviewed:    The following studies were reviewed today:    Recent Labs: 04/30/2019: ALT 55; BUN 12; Creatinine 0.70; Hemoglobin 13.4; Platelet Count 119; Potassium 4.6; Sodium 139 07/13/2019: NT-Pro BNP 74  Recent Lipid Panel    Component Value Date/Time   CHOL 105 07/11/2019 1326    TRIG 153.0 (H) 07/11/2019 1326   HDL 32.40 (L) 07/11/2019 1326   CHOLHDL 3 07/11/2019 1326   VLDL 30.6 07/11/2019 1326   LDLCALC 42 07/11/2019 1326    Physical Exam:    VS:  BP 128/64   Pulse 77   Temp 98 F (36.7 C)   Ht 5\' 6"  (1.676 m)   Wt 229 lb (103.9 kg)   SpO2 95%   BMI 36.96 kg/m     Wt Readings from Last 3 Encounters:  08/10/19 229 lb (103.9 kg)  08/09/19 229 lb (103.9 kg)  07/13/19 229 lb (103.9 kg)     GEN: She has a somewhat sallow appearance loss of eyebrows looks hypothyroid well nourished, well developed in no acute distress HEENT: Normal NECK: No JVD; No carotid bruits LYMPHATICS: No lymphadenopathy CARDIAC: RRR, no murmurs, rubs, gallops RESPIRATORY:  Clear to auscultation without rales, wheezing or rhonchi  ABDOMEN: Soft, non-tender, non-distended MUSCULOSKELETAL:  No edema; No deformity  SKIN: Warm and dry NEUROLOGIC:  Alert and oriented x 3 PSYCHIATRIC:  Normal affect    Signed, Shirlee More, MD  08/10/2019 2:38 PM    [place]

## 2019-08-11 LAB — BASIC METABOLIC PANEL
BUN/Creatinine Ratio: 24 (ref 12–28)
BUN: 16 mg/dL (ref 8–27)
CO2: 25 mmol/L (ref 20–29)
Calcium: 9.5 mg/dL (ref 8.7–10.3)
Chloride: 103 mmol/L (ref 96–106)
Creatinine, Ser: 0.68 mg/dL (ref 0.57–1.00)
GFR calc Af Amer: 94 mL/min/{1.73_m2} (ref >59)
GFR calc non Af Amer: 81 mL/min/{1.73_m2} (ref >59)
Glucose: 91 mg/dL (ref 65–99)
Potassium: 4.5 mmol/L (ref 3.5–5.2)
Sodium: 140 mmol/L (ref 134–144)

## 2019-08-11 LAB — DRUG TOX MONITOR 1 W/CONF, ORAL FLD
Amphetamines: NEGATIVE ng/mL (ref <10)
Barbiturates: NEGATIVE ng/mL (ref <10)
Benzodiazepines: NEGATIVE ng/mL (ref <0.50)
Buprenorphine: NEGATIVE ng/mL (ref <0.10)
Cocaine: NEGATIVE ng/mL (ref <5.0)
Codeine: NEGATIVE ng/mL (ref <2.5)
Dihydrocodeine: 37 ng/mL — ABNORMAL HIGH (ref <2.5)
Fentanyl: NEGATIVE ng/mL (ref <0.10)
Heroin Metabolite: NEGATIVE ng/mL (ref <1.0)
Hydrocodone: 225.1 ng/mL — ABNORMAL HIGH (ref <2.5)
Hydromorphone: NEGATIVE ng/mL (ref <2.5)
MARIJUANA: NEGATIVE ng/mL (ref <2.5)
MDMA: NEGATIVE ng/mL (ref <10)
Meprobamate: NEGATIVE ng/mL (ref <2.5)
Methadone: NEGATIVE ng/mL (ref <5.0)
Morphine: NEGATIVE ng/mL (ref <2.5)
Nicotine Metabolite: NEGATIVE ng/mL (ref <5.0)
Norhydrocodone: 14.6 ng/mL — ABNORMAL HIGH (ref <2.5)
Noroxycodone: NEGATIVE ng/mL (ref <2.5)
Opiates: POSITIVE ng/mL — AB (ref <2.5)
Oxycodone: NEGATIVE ng/mL (ref <2.5)
Oxymorphone: NEGATIVE ng/mL (ref <2.5)
Phencyclidine: NEGATIVE ng/mL (ref <10)
Tapentadol: NEGATIVE ng/mL (ref <5.0)
Tramadol: NEGATIVE ng/mL (ref <5.0)
Zolpidem: NEGATIVE ng/mL (ref <5.0)

## 2019-08-11 LAB — TSH: TSH: 2.54 u[IU]/mL (ref 0.450–4.500)

## 2019-08-11 LAB — DRUG TOX ALC METAB W/CON, ORAL FLD: Alcohol Metabolite: NEGATIVE ng/mL (ref <25)

## 2019-08-17 ENCOUNTER — Telehealth: Payer: Self-pay

## 2019-08-17 NOTE — Telephone Encounter (Signed)
UDS RESULTS CONSISTENT WITH MEDICATIONS ON FILE  

## 2019-08-23 ENCOUNTER — Other Ambulatory Visit: Payer: Self-pay | Admitting: Gastroenterology

## 2019-08-23 ENCOUNTER — Telehealth: Payer: Self-pay | Admitting: Gastroenterology

## 2019-08-23 DIAGNOSIS — K5900 Constipation, unspecified: Secondary | ICD-10-CM

## 2019-08-23 DIAGNOSIS — M6289 Other specified disorders of muscle: Secondary | ICD-10-CM

## 2019-08-23 NOTE — Telephone Encounter (Signed)
Spoke to patient to inform her that her ARM has been scheduled for 09/19/19 at 1230pm. Instructions have been mailed to her. She will bring proof of Covid vaccine with her at the time of appointment. All questions answered. Patient voiced understanding.

## 2019-09-14 DIAGNOSIS — H1033 Unspecified acute conjunctivitis, bilateral: Secondary | ICD-10-CM | POA: Diagnosis not present

## 2019-09-17 ENCOUNTER — Other Ambulatory Visit: Payer: Self-pay

## 2019-09-17 ENCOUNTER — Encounter: Payer: Medicare Other | Attending: Physical Medicine & Rehabilitation | Admitting: Registered Nurse

## 2019-09-17 ENCOUNTER — Encounter: Payer: Self-pay | Admitting: Registered Nurse

## 2019-09-17 VITALS — BP 147/81 | HR 87 | Temp 97.5°F | Ht 66.0 in | Wt 229.0 lb

## 2019-09-17 DIAGNOSIS — G4730 Sleep apnea, unspecified: Secondary | ICD-10-CM | POA: Insufficient documentation

## 2019-09-17 DIAGNOSIS — M1712 Unilateral primary osteoarthritis, left knee: Secondary | ICD-10-CM | POA: Diagnosis not present

## 2019-09-17 DIAGNOSIS — G8929 Other chronic pain: Secondary | ICD-10-CM | POA: Insufficient documentation

## 2019-09-17 DIAGNOSIS — M7061 Trochanteric bursitis, right hip: Secondary | ICD-10-CM

## 2019-09-17 DIAGNOSIS — M79604 Pain in right leg: Secondary | ICD-10-CM | POA: Diagnosis not present

## 2019-09-17 DIAGNOSIS — G894 Chronic pain syndrome: Secondary | ICD-10-CM | POA: Diagnosis not present

## 2019-09-17 DIAGNOSIS — Z9013 Acquired absence of bilateral breasts and nipples: Secondary | ICD-10-CM | POA: Diagnosis not present

## 2019-09-17 DIAGNOSIS — I10 Essential (primary) hypertension: Secondary | ICD-10-CM | POA: Insufficient documentation

## 2019-09-17 DIAGNOSIS — Z8489 Family history of other specified conditions: Secondary | ICD-10-CM | POA: Diagnosis not present

## 2019-09-17 DIAGNOSIS — Z79899 Other long term (current) drug therapy: Secondary | ICD-10-CM

## 2019-09-17 DIAGNOSIS — Z96643 Presence of artificial hip joint, bilateral: Secondary | ICD-10-CM | POA: Diagnosis not present

## 2019-09-17 DIAGNOSIS — S32000S Wedge compression fracture of unspecified lumbar vertebra, sequela: Secondary | ICD-10-CM

## 2019-09-17 DIAGNOSIS — Z85820 Personal history of malignant melanoma of skin: Secondary | ICD-10-CM | POA: Diagnosis not present

## 2019-09-17 DIAGNOSIS — Z87891 Personal history of nicotine dependence: Secondary | ICD-10-CM | POA: Diagnosis not present

## 2019-09-17 DIAGNOSIS — Z96651 Presence of right artificial knee joint: Secondary | ICD-10-CM | POA: Diagnosis not present

## 2019-09-17 DIAGNOSIS — Z5181 Encounter for therapeutic drug level monitoring: Secondary | ICD-10-CM

## 2019-09-17 DIAGNOSIS — M8580 Other specified disorders of bone density and structure, unspecified site: Secondary | ICD-10-CM | POA: Diagnosis not present

## 2019-09-17 DIAGNOSIS — R202 Paresthesia of skin: Secondary | ICD-10-CM | POA: Diagnosis not present

## 2019-09-17 DIAGNOSIS — E785 Hyperlipidemia, unspecified: Secondary | ICD-10-CM | POA: Diagnosis not present

## 2019-09-17 DIAGNOSIS — M961 Postlaminectomy syndrome, not elsewhere classified: Secondary | ICD-10-CM

## 2019-09-17 DIAGNOSIS — M7918 Myalgia, other site: Secondary | ICD-10-CM | POA: Insufficient documentation

## 2019-09-17 DIAGNOSIS — M170 Bilateral primary osteoarthritis of knee: Secondary | ICD-10-CM | POA: Diagnosis not present

## 2019-09-17 DIAGNOSIS — Z853 Personal history of malignant neoplasm of breast: Secondary | ICD-10-CM | POA: Insufficient documentation

## 2019-09-17 DIAGNOSIS — Z8673 Personal history of transient ischemic attack (TIA), and cerebral infarction without residual deficits: Secondary | ICD-10-CM | POA: Diagnosis not present

## 2019-09-17 DIAGNOSIS — K219 Gastro-esophageal reflux disease without esophagitis: Secondary | ICD-10-CM | POA: Insufficient documentation

## 2019-09-17 DIAGNOSIS — Z9889 Other specified postprocedural states: Secondary | ICD-10-CM | POA: Insufficient documentation

## 2019-09-17 DIAGNOSIS — M2550 Pain in unspecified joint: Secondary | ICD-10-CM

## 2019-09-17 DIAGNOSIS — M7062 Trochanteric bursitis, left hip: Secondary | ICD-10-CM | POA: Diagnosis not present

## 2019-09-17 DIAGNOSIS — Z9049 Acquired absence of other specified parts of digestive tract: Secondary | ICD-10-CM | POA: Diagnosis not present

## 2019-09-17 DIAGNOSIS — M79605 Pain in left leg: Secondary | ICD-10-CM | POA: Insufficient documentation

## 2019-09-17 MED ORDER — HYDROCODONE-ACETAMINOPHEN 10-325 MG PO TABS: 1.0000 | ORAL_TABLET | Freq: Four times a day (QID) | ORAL | 0 refills | Status: DC | PRN

## 2019-09-17 MED ORDER — CYCLOBENZAPRINE HCL 5 MG PO TABS: ORAL_TABLET | ORAL | 2 refills | Status: DC

## 2019-09-17 NOTE — Progress Notes (Signed)
Subjective:    Patient ID: Crystal Lee, female    DOB: February 28, 1934, 86 y.o.   MRN: [id number]  HPI: Crystal Lee is a 86 y.o. female who returns for follow up appointment for chronic pain and medication refill. She states her pain is located in her lower back, bilateral hips, lower extremities and bilateral knee pain. Also reports generalized joint pain all over. She rates her pain 5. Her current exercise regime is walking.   Crystal Lee reports she has been diagnosed with bilateral conjunctivitis opthalmology following.   Crystal Lee Morphine equivalent is 40.00 MME.  Last Oral Swab was Performed on 08/09/2019, it was consistent.    Pain Inventory Average Pain 5 Pain Right Now 5 My pain is burning, stabbing, tingling and aching  In the last 24 hours, has pain interfered with the following? General activity 0 Relation with others 3 Enjoyment of life 4 What TIME of day is your pain at its worst? daytime Sleep (in general) Fair  Pain is worse with: walking, bending, standing and some activites Pain improves with: rest, heat/ice and medication Relief from Meds: 5  Mobility walk with assistance use a cane use a walker ability to climb steps?  no do you drive?  yes Do you have any goals in this area?  yes  Function I need assistance with the following:  household duties and shopping  Neuro/Psych bowel control problems trouble walking  Prior Studies Any changes since last visit?  no  Physicians involved in your care Any changes since last visit?  no   Family History  Problem Relation Age of Onset  . Other Mother        complications from flu  . Other Father        unsure of cause  . Colon cancer Neg Hx    Social History   Socioeconomic History  . Marital status: Married    Spouse name: Not on file  . Number of children: 3  . Years of education: 16 years  . Highest education level: Not on file  Occupational History  . Occupation: Retired  Tobacco Use  .  Smoking status: Former Smoker    Quit date: 02/03/1976    Years since quitting: 43.6  . Smokeless tobacco: Never Used  Substance and Sexual Activity  . Alcohol use: No  . Drug use: No  . Sexual activity: Yes    Birth control/protection: Post-menopausal  Other Topics Concern  . Not on file  Social History Narrative   Lives at home with Crystal Lee.   Right-handed.   No caffeine use.   Social Determinants of Health   Financial Resource Strain:   . Difficulty of Paying Living Expenses:   Food Insecurity:   . Worried About Charity fundraiser in the Last Year:   . Arboriculturist in the Last Year:   Transportation Needs:   . Film/video editor (Medical):   Marland Kitchen Lack of Transportation (Non-Medical):   Physical Activity:   . Days of Exercise per Week:   . Minutes of Exercise per Session:   Stress:   . Feeling of Stress :   Social Connections:   . Frequency of Communication with Friends and Family:   . Frequency of Social Gatherings with Friends and Family:   . Attends Religious Services:   . Active Member of Clubs or Organizations:   . Attends Archivist Meetings:   Marland Kitchen Marital Status:    Past Surgical History:  Procedure  Laterality Date  . [place] STUDY N/A 06/21/2018   Procedure: [place] STUDY;  Surgeon: Lavena Bullion, DO;  [facility];  Service: Gastroenterology;  Laterality: N/A;  with impedance  . ABDOMINAL HYSTERECTOMY  2010  . APPENDECTOMY  1949  . [place]  . BREAST SURGERY    . CATARACT EXTRACTION Bilateral 2011  . CHOLECYSTECTOMY    . ESOPHAGEAL MANOMETRY N/A 06/21/2018   Procedure: ESOPHAGEAL MANOMETRY (EM);  Surgeon: Lavena Bullion, DO;  [facility];  Service: Gastroenterology;  Laterality: N/A;  . GALLBLADDER SURGERY  2015  . JOINT REPLACEMENT     RT TOTAL HIP / RT TOTAL KNEE  . MASTECTOMY  1998   BILATERAL   . [place] IMPEDANCE STUDY  06/21/2018   Procedure: [place] IMPEDANCE STUDY;  Surgeon: Lavena Bullion, DO;   [facility];  Service: Gastroenterology;;  . SKIN CANCER EXCISION  2016   RT SIDE OF NOSE  . TONSILLECTOMY  1953  . TOTAL HIP ARTHROPLASTY  2010   RIGHT  . TOTAL HIP ARTHROPLASTY Left 05/09/2015   Procedure: LEFT TOTAL HIP ARTHROPLASTY ANTERIOR APPROACH;  Surgeon: Gaynelle Arabian, MD;  [facility];  Service: Orthopedics;  Laterality: Left;  . TOTAL KNEE ARTHROPLASTY  2001  . TUMOR REMOVAL  2012   ABDOMINAL - NON CANCEROUS   Past Medical History:  Diagnosis Date  . Arthritis   . Cancer (HCC)    HX BREAST CANCER/ SKIN CANCER  . Complication of anesthesia    N/V WITH MORPHINE  . Difficulty sleeping   . Fractured hip ([place])    LEFT - AUG 2016  . GERD (gastroesophageal reflux disease)   . Hyperlipidemia   . Hypertension   . Melanoma ([place])   . Neuropathy   . Nocturia   . Osteopenia   . 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 PONV (postoperative nausea and vomiting)    PT STATES MORPHINE CAUSED N/V  . Rosacea   . Sleep apnea   . Stage 1 breast cancer, ER+, right (HCC) 07/04/2017   BP (!) 147/81   Pulse 87   Temp (!) 97.5 F (36.4 C)   Ht 5\' 6"  (1.676 m)   Wt 229 lb (103.9 kg)   SpO2 94%   BMI 36.96 kg/m   Opioid Risk Score:   Fall Risk Score:  `1  Depression screen PHQ 2/9  Depression screen [facility] 2/9 11/22/2018 09/28/2018 06/02/2018 04/10/2018 02/20/2018 10/17/2017  Decreased Interest 0 0 0 0 0 0  Down, Depressed, Hopeless 0 0 0 0 0 0  PHQ - 2 Score 0 0 0 0 0 0  Altered sleeping - - - - - 3  Tired, decreased energy - - - - - 3  Change in appetite - - - - - 0  Feeling bad or failure about yourself  - - - - - 0  Trouble concentrating - - - - - 0  Moving slowly or fidgety/restless - - - - - 0  Suicidal thoughts - - - - - 0  PHQ-9 Score - - - - - 6  Difficult doing work/chores - - - - - Somewhat difficult    Review of Systems  Constitutional: Negative.   HENT: Negative.   Eyes: Negative.   Respiratory: Negative.   Cardiovascular: Negative.     Gastrointestinal: Positive for constipation.  Endocrine: Negative.   Genitourinary: Negative.   Musculoskeletal: Positive for gait problem.  Skin: Negative.  Allergic/Immunologic: Negative.   Hematological: Negative.   Psychiatric/Behavioral: Negative.   All other systems reviewed and are negative.      Objective:   Physical Exam Vitals and nursing note reviewed.  Constitutional:      Appearance: Normal appearance.  Cardiovascular:     Rate and Rhythm: Normal rate and regular rhythm.     Pulses: Normal pulses.     Heart sounds: Normal heart sounds.  Pulmonary:     Effort: Pulmonary effort is normal.     Breath sounds: Normal breath sounds.  Musculoskeletal:     Cervical back: Normal range of motion and neck supple.     Comments: Normal Muscle Bulk and Muscle Testing Reveals:  Upper Extremities: Full ROM and Muscle Strength 5/5 Lumbar Paraspinal Tenderness: L-3-L-5 Bilateral Greater Trochanter Tenderness Lower Extremities: Full ROM and Muscle Strength 5/5 Arises from chair slowly using walker for support Narrow Based Gait   Skin:    General: Skin is warm and dry.  Neurological:     Mental Status: She is alert and oriented to person, place, and time.  Psychiatric:        Mood and Affect: Mood normal.        Behavior: Behavior normal.           Assessment & Plan:  1. Chronic Bilateral Leg pain: Paresthesia: Continue to Monitor.09/17/2019 2.Paresthesia /Lumbar Radiculitis:Ms. Voeks has weaned herself off the Lyrica due to daytime drowsiness. We will continue to monitor.09/17/2019 3. Pain of Left Wrist/:No complaints today.:S/P Carpal Tunnel Release on 06/03/2017 by Dr. Ellene Route. Dr. Ellene Route Following.09/17/2019. 4. Fracture of superior pubic ramus.Dr. AluisioFollowing.Continue to monitor.09/17/2019. 3.BilateralKnee OA: Continue Voltaren Gel.Continue to monitor. Orthopedist following.09/17/2019. 4. Polyarthralgia: Continue to alternate with heat and  ice therapy. Continue current medication regime. Continue to monitor.09/17/2019. 5. Chronic Pain Syndrome:Refilled::Hydrocodone10/325 mg one tablet every 6 hours as needed for moderate pain #120.09/17/2019. 6. Lumbar Compression Fracture L2 and L3: Ms. Marut refused physical therapy.Continue with rest/ heat therapy. Continue to Monitor.09/17/2019. 7. Muscle Spasm: ContinueFlexeril 5 mg at HS. Continue to monitor.09/17/2019.  F/U in 1 Month   15 minutes of face to face patient care time was spent during this visit. All questions were encouraged and answered.

## 2019-09-18 ENCOUNTER — Other Ambulatory Visit: Payer: Self-pay | Admitting: *Deleted

## 2019-09-18 ENCOUNTER — Other Ambulatory Visit (HOSPITAL_COMMUNITY)
Admission: RE | Admit: 2019-09-18 | Discharge: 2019-09-18 | Disposition: A | Discharge status: Home / Self Care | Payer: Medicare Other | Source: Ambulatory Visit | Attending: Gastroenterology | Admitting: Gastroenterology

## 2019-09-18 DIAGNOSIS — Z20822 Contact with and (suspected) exposure to covid-19: Secondary | ICD-10-CM | POA: Diagnosis not present

## 2019-09-18 DIAGNOSIS — Z01812 Encounter for preprocedural laboratory examination: Secondary | ICD-10-CM | POA: Insufficient documentation

## 2019-09-18 LAB — SARS CORONAVIRUS 2 (TAT 6-24 HRS): SARS Coronavirus 2: NEGATIVE

## 2019-09-18 MED ORDER — CYCLOBENZAPRINE HCL 5 MG PO TABS: ORAL_TABLET | ORAL | 0 refills | Status: DC

## 2019-09-19 ENCOUNTER — Ambulatory Visit (HOSPITAL_COMMUNITY)
Admission: RE | Admit: 2019-09-19 | Discharge: 2019-09-19 | Disposition: A | Discharge status: Home / Self Care | Payer: Medicare Other | Attending: Gastroenterology | Admitting: Gastroenterology

## 2019-09-19 ENCOUNTER — Encounter (HOSPITAL_COMMUNITY)
Admission: RE | Disposition: A | Discharge status: Home / Self Care | Payer: Self-pay | Source: Home / Self Care | Attending: Gastroenterology

## 2019-09-19 DIAGNOSIS — K5900 Constipation, unspecified: Secondary | ICD-10-CM

## 2019-09-19 HISTORY — PX: ANAL RECTAL MANOMETRY: SHX6358

## 2019-09-19 SURGERY — MANOMETRY, ANORECTAL

## 2019-09-19 NOTE — Progress Notes (Signed)
Anorectal manometry done per protocol.  Patient was able to expel 50 cc balloon in 10 seconds.  Patient tolerated well.  Report sent to Dr. Harl Bowie.

## 2019-09-21 ENCOUNTER — Encounter: Payer: Self-pay | Admitting: *Deleted

## 2019-09-21 DIAGNOSIS — H1033 Unspecified acute conjunctivitis, bilateral: Secondary | ICD-10-CM | POA: Diagnosis not present

## 2019-09-28 DIAGNOSIS — H1033 Unspecified acute conjunctivitis, bilateral: Secondary | ICD-10-CM | POA: Diagnosis not present

## 2019-10-01 DIAGNOSIS — H1033 Unspecified acute conjunctivitis, bilateral: Secondary | ICD-10-CM | POA: Diagnosis not present

## 2019-10-02 DIAGNOSIS — K5900 Constipation, unspecified: Secondary | ICD-10-CM

## 2019-10-04 DIAGNOSIS — H1033 Unspecified acute conjunctivitis, bilateral: Secondary | ICD-10-CM | POA: Diagnosis not present

## 2019-10-10 DIAGNOSIS — H1033 Unspecified acute conjunctivitis, bilateral: Secondary | ICD-10-CM | POA: Diagnosis not present

## 2019-10-22 ENCOUNTER — Inpatient Hospital Stay: Payer: Medicare Other | Attending: Hematology & Oncology

## 2019-10-22 ENCOUNTER — Inpatient Hospital Stay (HOSPITAL_BASED_OUTPATIENT_CLINIC_OR_DEPARTMENT_OTHER): Payer: Medicare Other | Admitting: Hematology & Oncology

## 2019-10-22 ENCOUNTER — Encounter: Payer: Self-pay | Admitting: Hematology & Oncology

## 2019-10-22 ENCOUNTER — Other Ambulatory Visit: Payer: Self-pay

## 2019-10-22 ENCOUNTER — Telehealth: Payer: Self-pay | Admitting: Hematology & Oncology

## 2019-10-22 VITALS — BP 141/72 | HR 89 | Temp 96.6°F | Resp 18 | Wt 229.0 lb

## 2019-10-22 DIAGNOSIS — Z170 Estrogen receptor positive status [ER+]: Secondary | ICD-10-CM | POA: Diagnosis not present

## 2019-10-22 DIAGNOSIS — Z885 Allergy status to narcotic agent status: Secondary | ICD-10-CM | POA: Diagnosis not present

## 2019-10-22 DIAGNOSIS — Z853 Personal history of malignant neoplasm of breast: Secondary | ICD-10-CM | POA: Diagnosis not present

## 2019-10-22 DIAGNOSIS — C50911 Malignant neoplasm of unspecified site of right female breast: Secondary | ICD-10-CM | POA: Diagnosis not present

## 2019-10-22 DIAGNOSIS — R590 Localized enlarged lymph nodes: Secondary | ICD-10-CM | POA: Insufficient documentation

## 2019-10-22 DIAGNOSIS — I509 Heart failure, unspecified: Secondary | ICD-10-CM

## 2019-10-22 DIAGNOSIS — M545 Low back pain, unspecified: Secondary | ICD-10-CM

## 2019-10-22 DIAGNOSIS — R945 Abnormal results of liver function studies: Secondary | ICD-10-CM | POA: Insufficient documentation

## 2019-10-22 DIAGNOSIS — Z888 Allergy status to other drugs, medicaments and biological substances status: Secondary | ICD-10-CM | POA: Diagnosis not present

## 2019-10-22 LAB — CMP (CANCER CENTER ONLY)
ALT: 49 U/L — ABNORMAL HIGH (ref 0–44)
AST: 62 U/L — ABNORMAL HIGH (ref 15–41)
Albumin: 4 g/dL (ref 3.5–5.0)
Alkaline Phosphatase: 143 U/L — ABNORMAL HIGH (ref 38–126)
Anion gap: 8 (ref 5–15)
BUN: 14 mg/dL (ref 8–23)
CO2: 28 mmol/L (ref 22–32)
Calcium: 10.1 mg/dL (ref 8.9–10.3)
Chloride: 104 mmol/L (ref 98–111)
Creatinine: 0.73 mg/dL (ref 0.44–1.00)
GFR, Est AFR Am: >60 mL/min (ref >60)
GFR, Estimated: >60 mL/min (ref >60)
Glucose, Bld: 129 mg/dL — ABNORMAL HIGH (ref 70–99)
Potassium: 4.3 mmol/L (ref 3.5–5.1)
Sodium: 140 mmol/L (ref 135–145)
Total Bilirubin: 0.6 mg/dL (ref 0.3–1.2)
Total Protein: 7.6 g/dL (ref 6.5–8.1)

## 2019-10-22 LAB — CBC WITH DIFFERENTIAL (CANCER CENTER ONLY)
Abs Immature Granulocytes: 0.03 10*3/uL (ref 0.00–0.07)
Basophils Absolute: 0.1 10*3/uL (ref 0.0–0.1)
Basophils Relative: 1 %
Eosinophils Absolute: 0.3 10*3/uL (ref 0.0–0.5)
Eosinophils Relative: 4 %
HCT: 42.1 % (ref 36.0–46.0)
Hemoglobin: 13.5 g/dL (ref 12.0–15.0)
Immature Granulocytes: 0 %
Lymphocytes Relative: 32 %
Lymphs Abs: 2.2 10*3/uL (ref 0.7–4.0)
MCH: 29.7 pg (ref 26.0–34.0)
MCHC: 32.1 g/dL (ref 30.0–36.0)
MCV: 92.7 fL (ref 80.0–100.0)
Monocytes Absolute: 0.5 10*3/uL (ref 0.1–1.0)
Monocytes Relative: 7 %
Neutro Abs: 3.9 10*3/uL (ref 1.7–7.7)
Neutrophils Relative %: 56 %
Platelet Count: 118 10*3/uL — ABNORMAL LOW (ref 150–400)
RBC: 4.54 MIL/uL (ref 3.87–5.11)
RDW: 13.9 % (ref 11.5–15.5)
WBC Count: 7 10*3/uL (ref 4.0–10.5)
nRBC: 0 % (ref 0.0–0.2)

## 2019-10-22 NOTE — Progress Notes (Signed)
Hematology and Oncology Follow Up Visit  Crystal Lee [id number] 09-29-1933 86 y.o. 10/22/2019   Principle Diagnosis:  Bilateral stage Ia breast cancer-1998; thoracic adenopathy of undetermined significance  Current Therapy:   Observation   Interim History:  Crystal Lee is here today for follow-up.  Unfortunately, her problem right now is that she has onset of lower back discomfort.  This is happened over the past 4 weeks.  She does not have any trauma..  She has had a past history of back surgery.  There is no bowel or bladder incontinence.  There is no radicular pain down her legs.  I do believe this is going to be arthritic in away.  She may have spinal stenosis.  Given the history of breast cancer, we probably do need to do an MRI so we can check out to make sure there is no evidence of spread.  She also has had persistently elevated liver function studies.  I have to believe this is from the Lipitor that she is taking.  Her liver tests have been slightly elevated for the past 6 months.  They have not gotten any worse.  She still is dealing with her husband's death.  This was a little over a year ago.  I just feel bad that she is under stress right now.  Overall, her performance status is [name].   Medications:  Allergies as of 10/22/2019      Reactions   Adhesive [tape]    Morphine And Related Nausea And Vomiting      Medication List       Accurate as of Oct 22, 2019  2:31 PM. If you have any questions, ask your nurse or doctor.        AMBULATORY NON FORMULARY MEDICATION Take 10 mLs by mouth every 4 (four) hours as needed. Medication Name: Viscous Lidocaine, Bentyl, Maalox (equal parts) Take 50mL by mouth everey 4 hours as needed.   atorvastatin 20 MG tablet Commonly known as: LIPITOR Take 1 tablet (20 mg total) by mouth daily.   CALCIUM PO Take 1,200 mg by mouth daily.   cyclobenzaprine 5 MG tablet Commonly known as: FLEXERIL TAKE ONE TABLET BY MOUTH AT  BEDTIME AS NEEDED FOR MUSCLE SPASMS   ergocalciferol 1.25 MG (50000 UT) capsule Commonly known as: VITAMIN D2 Take 1 capsule (50,000 Units total) by mouth once a week.   furosemide 20 MG tablet Commonly known as: LASIX Take 1 tablet (20 mg total) by mouth daily.   HYDROcodone-acetaminophen 10-325 MG tablet Commonly known as: NORCO Take 1 tablet by mouth every 6 (six) hours as needed.   Linzess 290 MCG Caps capsule Generic drug: linaclotide Take 1 capsule (290 mcg total) by mouth daily.   losartan 25 MG tablet Commonly known as: COZAAR Take 1 tablet (25 mg total) by mouth daily.   metroNIDAZOLE 1 % gel Commonly known as: Metrogel Apply topically daily. Use as needed for rosacea   ondansetron 4 MG disintegrating tablet Commonly known as: ZOFRAN-ODT as needed.   potassium chloride 10 MEQ tablet Commonly known as: KLOR-CON Take 1 tablet (10 mEq total) by mouth daily.   Trulance 3 MG Tabs Generic drug: Plecanatide Take 3 mg by mouth daily.   Vitamin D 50 MCG (2000 UT) tablet Take 2,000 Units by mouth.       Allergies:  Allergies  Allergen Reactions  . Adhesive [Tape]   . Morphine And Related Nausea And Vomiting    Past Medical History, Surgical history, Social  history, and Family History were reviewed and updated.  Review of Systems: Review of Systems  Constitutional: Negative.   HENT: Negative.   Eyes: Negative.   Respiratory: Negative.   Cardiovascular: Positive for leg swelling.  Gastrointestinal: Positive for constipation.  Genitourinary: Negative.   Musculoskeletal: Negative.   Skin: Negative.   Neurological: Negative.   Endo/Heme/Allergies: Negative.   Psychiatric/Behavioral: Negative.      Physical Exam:  vitals were not taken for this visit.   Wt Readings from Last 3 Encounters:  09/17/19 229 lb (103.9 kg)  08/10/19 229 lb (103.9 kg)  08/09/19 229 lb (103.9 kg)    Physical Exam Vitals reviewed.  HENT:     Head: Normocephalic and  atraumatic.  Eyes:     Pupils: Pupils are equal, round, and reactive to light.  Cardiovascular:     Rate and Rhythm: Normal rate and regular rhythm.     Heart sounds: Normal heart sounds.  Pulmonary:     Effort: Pulmonary effort is normal.     Breath sounds: Normal breath sounds.  Abdominal:     General: Bowel sounds are normal.     Palpations: Abdomen is soft.  Musculoskeletal:        General: No tenderness or deformity. Normal range of motion.     Cervical back: Normal range of motion.  Lymphadenopathy:     Cervical: No cervical adenopathy.  Skin:    General: Skin is warm and dry.     Findings: No erythema or rash.  Neurological:     Mental Status: She is alert and oriented to person, place, and time.  Psychiatric:        Behavior: Behavior normal.        Thought Content: Thought content normal.        Judgment: Judgment normal.      Lab Results  Component Value Date   WBC 7.0 10/22/2019   HGB 13.5 10/22/2019   HCT 42.1 10/22/2019   MCV 92.7 10/22/2019   PLT 118 (L) 10/22/2019   Lab Results  Component Value Date   FERRITIN 131 02/09/2017   Lab Results  Component Value Date   RBC 4.54 10/22/2019   No results found for: [name] Lab Results  Component Value Date   IGGSERUM 1,085 02/09/2017   IGMSERUM 336 (H) 02/09/2017   No results found for: [name], SPEI   Chemistry      Component Value Date/Time   NA 140 08/10/2019 1453   K 4.5 08/10/2019 1453   CL 103 08/10/2019 1453   CO2 25 08/10/2019 1453   BUN 16 08/10/2019 1453   CREATININE 0.68 08/10/2019 1453   CREATININE 0.70 04/30/2019 0939      Component Value Date/Time   CALCIUM 9.5 08/10/2019 1453   ALKPHOS 135 (H) 04/30/2019 0939   AST 76 (H) 04/30/2019 0939   ALT 55 (H) 04/30/2019 0939   BILITOT 0.7 04/30/2019 0939       Impression and Plan: Crystal Lee is a very pleasant 86 yo caucasian female with  history of bilateral stage Ia breast cancer diagnosed back in 1998.  I do not see any issues with respect to the breast cancer.  Again, we will get an MRI of her back.  I had to believe this is not anything from her cancer.  She had stage I breast cancer back in 1998.  I think the chance of this coming back is probably can be less  than 5%.  If there is nothing on the MRI that looks like malignancy, then she probably needs to go back to see her neurosurgeon or see her family doctor.  She is on Lipitor.  She probably could stop the Lipitor for a little bit and see if the liver function studies improve.  I will have to plan to see her back in another 6 weeks just so that we can follow-up with everything.Marland Kitchen   [name], MD 5/24/20212:31 PM

## 2019-10-22 NOTE — Telephone Encounter (Signed)
Appointments scheduled calendar printed & mailed per 5/24 los 

## 2019-10-23 ENCOUNTER — Encounter: Payer: Medicare Other | Attending: Physical Medicine & Rehabilitation | Admitting: Registered Nurse

## 2019-10-23 ENCOUNTER — Other Ambulatory Visit: Payer: Self-pay

## 2019-10-23 ENCOUNTER — Encounter: Payer: Self-pay | Admitting: Registered Nurse

## 2019-10-23 VITALS — BP 145/82 | HR 102 | Temp 97.0°F | Ht 66.0 in | Wt 229.0 lb

## 2019-10-23 DIAGNOSIS — M961 Postlaminectomy syndrome, not elsewhere classified: Secondary | ICD-10-CM

## 2019-10-23 DIAGNOSIS — Z87891 Personal history of nicotine dependence: Secondary | ICD-10-CM | POA: Insufficient documentation

## 2019-10-23 DIAGNOSIS — M7061 Trochanteric bursitis, right hip: Secondary | ICD-10-CM | POA: Diagnosis not present

## 2019-10-23 DIAGNOSIS — S32000S Wedge compression fracture of unspecified lumbar vertebra, sequela: Secondary | ICD-10-CM

## 2019-10-23 DIAGNOSIS — Z5181 Encounter for therapeutic drug level monitoring: Secondary | ICD-10-CM | POA: Diagnosis not present

## 2019-10-23 DIAGNOSIS — M7918 Myalgia, other site: Secondary | ICD-10-CM | POA: Insufficient documentation

## 2019-10-23 DIAGNOSIS — Z8489 Family history of other specified conditions: Secondary | ICD-10-CM | POA: Diagnosis not present

## 2019-10-23 DIAGNOSIS — Z9889 Other specified postprocedural states: Secondary | ICD-10-CM | POA: Diagnosis not present

## 2019-10-23 DIAGNOSIS — Z853 Personal history of malignant neoplasm of breast: Secondary | ICD-10-CM | POA: Diagnosis not present

## 2019-10-23 DIAGNOSIS — M1712 Unilateral primary osteoarthritis, left knee: Secondary | ICD-10-CM | POA: Diagnosis not present

## 2019-10-23 DIAGNOSIS — M2550 Pain in unspecified joint: Secondary | ICD-10-CM

## 2019-10-23 DIAGNOSIS — Z96643 Presence of artificial hip joint, bilateral: Secondary | ICD-10-CM | POA: Insufficient documentation

## 2019-10-23 DIAGNOSIS — Z85820 Personal history of malignant melanoma of skin: Secondary | ICD-10-CM | POA: Diagnosis not present

## 2019-10-23 DIAGNOSIS — R202 Paresthesia of skin: Secondary | ICD-10-CM

## 2019-10-23 DIAGNOSIS — M7062 Trochanteric bursitis, left hip: Secondary | ICD-10-CM | POA: Diagnosis not present

## 2019-10-23 DIAGNOSIS — G894 Chronic pain syndrome: Secondary | ICD-10-CM

## 2019-10-23 DIAGNOSIS — Z9013 Acquired absence of bilateral breasts and nipples: Secondary | ICD-10-CM | POA: Insufficient documentation

## 2019-10-23 DIAGNOSIS — Z8673 Personal history of transient ischemic attack (TIA), and cerebral infarction without residual deficits: Secondary | ICD-10-CM | POA: Diagnosis not present

## 2019-10-23 DIAGNOSIS — Z9049 Acquired absence of other specified parts of digestive tract: Secondary | ICD-10-CM | POA: Insufficient documentation

## 2019-10-23 DIAGNOSIS — Z79899 Other long term (current) drug therapy: Secondary | ICD-10-CM | POA: Insufficient documentation

## 2019-10-23 DIAGNOSIS — G8929 Other chronic pain: Secondary | ICD-10-CM | POA: Insufficient documentation

## 2019-10-23 DIAGNOSIS — M170 Bilateral primary osteoarthritis of knee: Secondary | ICD-10-CM | POA: Diagnosis not present

## 2019-10-23 DIAGNOSIS — I10 Essential (primary) hypertension: Secondary | ICD-10-CM | POA: Diagnosis not present

## 2019-10-23 DIAGNOSIS — Z96651 Presence of right artificial knee joint: Secondary | ICD-10-CM | POA: Diagnosis not present

## 2019-10-23 DIAGNOSIS — K219 Gastro-esophageal reflux disease without esophagitis: Secondary | ICD-10-CM | POA: Diagnosis not present

## 2019-10-23 DIAGNOSIS — M8580 Other specified disorders of bone density and structure, unspecified site: Secondary | ICD-10-CM | POA: Insufficient documentation

## 2019-10-23 DIAGNOSIS — M79604 Pain in right leg: Secondary | ICD-10-CM | POA: Insufficient documentation

## 2019-10-23 DIAGNOSIS — M79605 Pain in left leg: Secondary | ICD-10-CM | POA: Diagnosis not present

## 2019-10-23 DIAGNOSIS — E785 Hyperlipidemia, unspecified: Secondary | ICD-10-CM | POA: Insufficient documentation

## 2019-10-23 DIAGNOSIS — G4730 Sleep apnea, unspecified: Secondary | ICD-10-CM | POA: Insufficient documentation

## 2019-10-23 LAB — LACTATE DEHYDROGENASE: LDH: 204 U/L — ABNORMAL HIGH (ref 98–192)

## 2019-10-23 LAB — CANCER ANTIGEN 27.29: CA 27.29: 22.4 U/mL (ref 0.0–38.6)

## 2019-10-23 MED ORDER — HYDROCODONE-ACETAMINOPHEN 10-325 MG PO TABS: 1.0000 | ORAL_TABLET | Freq: Four times a day (QID) | ORAL | 0 refills | Status: DC | PRN

## 2019-10-23 NOTE — Progress Notes (Signed)
Subjective:    Patient ID: Crystal Lee, female    DOB: 04/15/1934, 86 y.o.   MRN: [id number]  HPI: Crystal Lee is a 86 y.o. female who returns for follow up appointment for chronic pain and medication refill. She states her pain is located in her lower back, bilateral lower extremities and bilateral hip pain. Also reports generalized joint pain. She rates her pain 5. Her current exercise regime is walking.  Ms.  Morphine equivalent is 40.00  MME.  Last Oral Swab was Performed on 08/09/2019, it was consistent.    Pain Inventory Average Pain 5 Pain Right Now 5 My pain is burning, tingling and aching  In the last 24 hours, has pain interfered with the following? General activity 7 Relation with others 0 Enjoyment of life 6 What TIME of day is your pain at its worst? daytime, evening  Sleep (in general) Poor  Pain is worse with: walking, bending, standing and some activites Pain improves with: rest, heat/ice and medication Relief from Meds: 8  Mobility walk with assistance use a cane use a walker ability to climb steps?  no do you drive?  yes Do you have any goals in this area?  yes  Function retired I need assistance with the following:  household duties and shopping  Neuro/Psych trouble walking  Prior Studies Any changes since last visit?  no  Physicians involved in your care Any changes since last visit?  no   Family History  Problem Relation Age of Onset  . Other Mother        complications from flu  . Other Father        unsure of cause  . Colon cancer Neg Hx    Social History   Socioeconomic History  . Marital status: Widowed    Spouse name: Not on file  . Number of children: 3  . Years of education: 16 years  . Highest education level: Not on file  Occupational History  . Occupation: Retired  Tobacco Use  . Smoking status: Former Smoker    Quit date: 02/03/1976    Years since quitting: 43.7  . Smokeless tobacco: Never Used  Substance  and Sexual Activity  . Alcohol use: No  . Drug use: No  . Sexual activity: Yes    Birth control/protection: Post-menopausal  Other Topics Concern  . Not on file  Social History Narrative   Lives at home with husband.   Right-handed.   No caffeine use.   Social Determinants of Health   Financial Resource Strain:   . Difficulty of Paying Living Expenses:   Food Insecurity:   . Worried About Charity fundraiser in the Last Year:   . Arboriculturist in the Last Year:   Transportation Needs:   . Film/video editor (Medical):   Marland Kitchen Lack of Transportation (Non-Medical):   Physical Activity:   . Days of Exercise per Week:   . Minutes of Exercise per Session:   Stress:   . Feeling of Stress :   Social Connections:   . Frequency of Communication with Friends and Family:   . Frequency of Social Gatherings with Friends and Family:   . Attends Religious Services:   . Active Member of Clubs or Organizations:   . Attends Archivist Meetings:   Marland Kitchen Marital Status:    Past Surgical History:  Procedure Laterality Date  . [place] STUDY N/A 06/21/2018   Procedure: 24 HOUR PH STUDY;  Surgeon: Lavena Bullion, DO;  [facility];  Service: Gastroenterology;  Laterality: N/A;  with impedance  . ABDOMINAL HYSTERECTOMY  2010  . A[place] RECTAL MANOMETRY N/A 09/19/2019   Procedure: ANO RECTAL MANOMETRY;  Surgeon: Mauri Pole, MD;  [facility];  Service: Endoscopy;  Laterality: N/A;  . APPENDECTOMY  1949  . [place]  . BREAST SURGERY    . CATARACT EXTRACTION Bilateral 2011  . CHOLECYSTECTOMY    . ESOPHAGEAL MANOMETRY N/A 06/21/2018   Procedure: ESOPHAGEAL MANOMETRY (EM);  Surgeon: Lavena Bullion, DO;  [facility];  Service: Gastroenterology;  Laterality: N/A;  . GALLBLADDER SURGERY  2015  . JOINT REPLACEMENT     RT TOTAL HIP / RT TOTAL KNEE  . MASTECTOMY  1998   BILATERAL   . [place] IMPEDANCE STUDY  06/21/2018   Procedure: [place]  IMPEDANCE STUDY;  Surgeon: Lavena Bullion, DO;  [facility];  Service: Gastroenterology;;  . SKIN CANCER EXCISION  2016   RT SIDE OF NOSE  . TONSILLECTOMY  1953  . TOTAL HIP ARTHROPLASTY  2010   RIGHT  . TOTAL HIP ARTHROPLASTY Left 05/09/2015   Procedure: LEFT TOTAL HIP ARTHROPLASTY ANTERIOR APPROACH;  Surgeon: Gaynelle Arabian, MD;  [facility];  Service: Orthopedics;  Laterality: Left;  . TOTAL KNEE ARTHROPLASTY  2001  . TUMOR REMOVAL  2012   ABDOMINAL - NON CANCEROUS   Past Medical History:  Diagnosis Date  . Arthritis   . Cancer (HCC)    HX BREAST CANCER/ SKIN CANCER  . Complication of anesthesia    N/V WITH MORPHINE  . Difficulty sleeping   . Fractured hip ([place])    LEFT - AUG 2016  . GERD (gastroesophageal reflux disease)   . Hyperlipidemia   . Hypertension   . Melanoma ([place])   . Neuropathy   . Nocturia   . Osteopenia   . 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 PONV (postoperative nausea and vomiting)    PT STATES MORPHINE CAUSED N/V  . Rosacea   . Sleep apnea   . Stage 1 breast cancer, ER+, right (HCC) 07/04/2017   Temp (!) 97 F (36.1 C)   Ht 5\' 6"  (1.676 m)   Wt 229 lb (103.9 kg)   BMI 36.96 kg/m   Opioid Risk Score:   Fall Risk Score:  `1  Depression screen PHQ 2/9  Depression screen [facility] 2/9 11/22/2018 09/28/2018 06/02/2018 04/10/2018 02/20/2018 10/17/2017  Decreased Interest 0 0 0 0 0 0  Down, Depressed, Hopeless 0 0 0 0 0 0  PHQ - 2 Score 0 0 0 0 0 0  Altered sleeping - - - - - 3  Tired, decreased energy - - - - - 3  Change in appetite - - - - - 0  Feeling bad or failure about yourself  - - - - - 0  Trouble concentrating - - - - - 0  Moving slowly or fidgety/restless - - - - - 0  Suicidal thoughts - - - - - 0  PHQ-9 Score - - - - - 6  Difficult doing work/chores - - - - - Somewhat difficult    Review of Systems  Constitutional: Negative.   HENT: Negative.   Eyes: Negative.   Respiratory: Negative.   Cardiovascular:  Positive for leg swelling.  Gastrointestinal: Positive for constipation.  Endocrine: Negative.   Genitourinary: Negative.   Musculoskeletal: Positive for back pain and gait problem.  Allergic/Immunologic:  Negative.   Hematological: Negative.   Psychiatric/Behavioral: Negative.   All other systems reviewed and are negative.      Objective:   Physical Exam Vitals and nursing note reviewed.  Constitutional:      Appearance: Normal appearance.  Cardiovascular:     Rate and Rhythm: Normal rate and regular rhythm.     Pulses: Normal pulses.     Heart sounds: Normal heart sounds.  Pulmonary:     Effort: Pulmonary effort is normal.     Breath sounds: Normal breath sounds.  Musculoskeletal:     Cervical back: Normal range of motion and neck supple.     Comments: Normal Muscle Bulk and Muscle Testing Reveals:  Upper Extremities: Full ROM and Muscle Strength 5/5 , Lumbar Paraspinal Tenderness: L-3-L-5 Lower Extremities: Full ROM and Muscle Strength 5/5 Arises from chair slowly using walker for support Narrow Based  Gait   Skin:    General: Skin is warm and dry.  Neurological:     Mental Status: She is alert and oriented to person, place, and time.  Psychiatric:        Mood and Affect: Mood normal.        Behavior: Behavior normal.           Assessment & Plan:  1. Chronic Bilateral Leg pain: Paresthesia: Continue to Monitor.10/23/2019 2.Paresthesia /Lumbar Radiculitis:Ms. Harrellhas weaned herself off the Lyrica due to daytime drowsiness. We will continue to monitor.10/23/2019 3. Pain of Left Wrist/:No complaints today.:S/P Carpal Tunnel Release on 06/03/2017 by Dr. Ellene Route. Dr. Ellene Route Following.10/23/2019. 4. Fracture of superior pubic ramus.Dr. AluisioFollowing.Continue to monitor.10/23/2019. 5.BilateralKnee OA: Continue Voltaren Gel.Continue to monitor. Orthopedist following.10/23/2019. 6. Polyarthralgia: Continue to alternate with heat and ice therapy.  Continue current medication regime. Continue to monitor.10/23/2019. 7. Chronic Pain Syndrome:Refilled::Hydrocodone10/325 mg one tablet every 6 hours as needed for moderate pain #120.10/23/2019. 8. Lumbar Compression Fracture L2 and L3: Ms. Buehrle refused physical therapy.Continue with rest/ heat therapy. Continue to Monitor.10/23/2019. 9. Muscle Spasm: ContinueFlexeril 5 mg at HS. Continue to monitor.10/23/2019.  F/U in 1 Month  15 minutes of face to face patient care time was spent during this visit. All questions were encouraged and answered.

## 2019-10-24 DIAGNOSIS — H1033 Unspecified acute conjunctivitis, bilateral: Secondary | ICD-10-CM | POA: Diagnosis not present

## 2019-11-08 DIAGNOSIS — M25531 Pain in right wrist: Secondary | ICD-10-CM | POA: Diagnosis not present

## 2019-11-08 DIAGNOSIS — M654 Radial styloid tenosynovitis [de Quervain]: Secondary | ICD-10-CM | POA: Diagnosis not present

## 2019-11-08 DIAGNOSIS — Z4789 Encounter for other orthopedic aftercare: Secondary | ICD-10-CM | POA: Diagnosis not present

## 2019-11-22 ENCOUNTER — Other Ambulatory Visit: Payer: Self-pay

## 2019-11-22 ENCOUNTER — Encounter: Payer: Medicare Other | Attending: Physical Medicine & Rehabilitation | Admitting: Registered Nurse

## 2019-11-22 ENCOUNTER — Encounter: Payer: Self-pay | Admitting: Registered Nurse

## 2019-11-22 VITALS — BP 150/78 | HR 84 | Temp 97.5°F | Ht 66.0 in | Wt 229.6 lb

## 2019-11-22 DIAGNOSIS — Z9889 Other specified postprocedural states: Secondary | ICD-10-CM | POA: Diagnosis not present

## 2019-11-22 DIAGNOSIS — M8580 Other specified disorders of bone density and structure, unspecified site: Secondary | ICD-10-CM | POA: Diagnosis not present

## 2019-11-22 DIAGNOSIS — M961 Postlaminectomy syndrome, not elsewhere classified: Secondary | ICD-10-CM

## 2019-11-22 DIAGNOSIS — Z760 Encounter for issue of repeat prescription: Secondary | ICD-10-CM | POA: Insufficient documentation

## 2019-11-22 DIAGNOSIS — M1712 Unilateral primary osteoarthritis, left knee: Secondary | ICD-10-CM | POA: Insufficient documentation

## 2019-11-22 DIAGNOSIS — M7918 Myalgia, other site: Secondary | ICD-10-CM | POA: Insufficient documentation

## 2019-11-22 DIAGNOSIS — M2550 Pain in unspecified joint: Secondary | ICD-10-CM

## 2019-11-22 DIAGNOSIS — I10 Essential (primary) hypertension: Secondary | ICD-10-CM | POA: Diagnosis not present

## 2019-11-22 DIAGNOSIS — Z96643 Presence of artificial hip joint, bilateral: Secondary | ICD-10-CM | POA: Insufficient documentation

## 2019-11-22 DIAGNOSIS — M79605 Pain in left leg: Secondary | ICD-10-CM | POA: Insufficient documentation

## 2019-11-22 DIAGNOSIS — Z8489 Family history of other specified conditions: Secondary | ICD-10-CM | POA: Insufficient documentation

## 2019-11-22 DIAGNOSIS — Z8673 Personal history of transient ischemic attack (TIA), and cerebral infarction without residual deficits: Secondary | ICD-10-CM | POA: Diagnosis not present

## 2019-11-22 DIAGNOSIS — Z853 Personal history of malignant neoplasm of breast: Secondary | ICD-10-CM | POA: Diagnosis not present

## 2019-11-22 DIAGNOSIS — Z79899 Other long term (current) drug therapy: Secondary | ICD-10-CM | POA: Insufficient documentation

## 2019-11-22 DIAGNOSIS — S32000S Wedge compression fracture of unspecified lumbar vertebra, sequela: Secondary | ICD-10-CM | POA: Diagnosis not present

## 2019-11-22 DIAGNOSIS — R202 Paresthesia of skin: Secondary | ICD-10-CM | POA: Diagnosis not present

## 2019-11-22 DIAGNOSIS — K219 Gastro-esophageal reflux disease without esophagitis: Secondary | ICD-10-CM | POA: Insufficient documentation

## 2019-11-22 DIAGNOSIS — Z85820 Personal history of malignant melanoma of skin: Secondary | ICD-10-CM | POA: Insufficient documentation

## 2019-11-22 DIAGNOSIS — M7062 Trochanteric bursitis, left hip: Secondary | ICD-10-CM

## 2019-11-22 DIAGNOSIS — M79604 Pain in right leg: Secondary | ICD-10-CM | POA: Diagnosis not present

## 2019-11-22 DIAGNOSIS — G894 Chronic pain syndrome: Secondary | ICD-10-CM | POA: Diagnosis not present

## 2019-11-22 DIAGNOSIS — E785 Hyperlipidemia, unspecified: Secondary | ICD-10-CM | POA: Diagnosis not present

## 2019-11-22 DIAGNOSIS — Z96651 Presence of right artificial knee joint: Secondary | ICD-10-CM | POA: Diagnosis not present

## 2019-11-22 DIAGNOSIS — Z9049 Acquired absence of other specified parts of digestive tract: Secondary | ICD-10-CM | POA: Insufficient documentation

## 2019-11-22 DIAGNOSIS — M4856XA Collapsed vertebra, not elsewhere classified, lumbar region, initial encounter for fracture: Secondary | ICD-10-CM | POA: Insufficient documentation

## 2019-11-22 DIAGNOSIS — M7061 Trochanteric bursitis, right hip: Secondary | ICD-10-CM

## 2019-11-22 DIAGNOSIS — M62838 Other muscle spasm: Secondary | ICD-10-CM | POA: Insufficient documentation

## 2019-11-22 DIAGNOSIS — M170 Bilateral primary osteoarthritis of knee: Secondary | ICD-10-CM

## 2019-11-22 DIAGNOSIS — Z9013 Acquired absence of bilateral breasts and nipples: Secondary | ICD-10-CM | POA: Diagnosis not present

## 2019-11-22 DIAGNOSIS — Z87891 Personal history of nicotine dependence: Secondary | ICD-10-CM | POA: Diagnosis not present

## 2019-11-22 DIAGNOSIS — G4730 Sleep apnea, unspecified: Secondary | ICD-10-CM | POA: Diagnosis not present

## 2019-11-22 DIAGNOSIS — M5416 Radiculopathy, lumbar region: Secondary | ICD-10-CM | POA: Insufficient documentation

## 2019-11-22 DIAGNOSIS — Z5181 Encounter for therapeutic drug level monitoring: Secondary | ICD-10-CM | POA: Diagnosis not present

## 2019-11-22 DIAGNOSIS — M545 Low back pain: Secondary | ICD-10-CM | POA: Diagnosis not present

## 2019-11-22 MED ORDER — HYDROCODONE-ACETAMINOPHEN 10-325 MG PO TABS: 1.0000 | ORAL_TABLET | Freq: Four times a day (QID) | ORAL | 0 refills | Status: DC | PRN

## 2019-11-22 NOTE — Progress Notes (Signed)
Subjective:    Patient ID: Crystal Lee, female    DOB: [birth year], 86 y.o.   MRN: [id number]  HPI: Crystal Lee is a 86 y.o. female who returns for follow up appointment for chronic pain and medication refill. She states her pain is located in her mid- lower back pain, bilateral hip pain and bilateral lower extremities. Also reports generalized joint pain. She rates her pain 6. Her current exercise regime is walking and performing stretching exercises.  Crystal Lee is 40.00  MME.  Oral Swab was Performed Today.   Pain Inventory Average Pain 5 Pain Right Now 6 My pain is burning, stabbing and aching  In the last 24 hours, has pain interfered with the following? General activity 6 Relation with others 7 Enjoyment of life 0 What TIME of day is your pain at its worst? night Sleep (in general) Poor  Pain is worse with: walking, bending and standing Pain improves with: rest, heat/ice and medication Relief from Meds: 6  Mobility walk with assistance use a cane use a walker ability to climb steps?  no do you drive?  yes  Function retired I need assistance with the following:  household duties and shopping  Neuro/Psych trouble walking  Prior Studies Any changes since last visit?  no  Physicians involved in your care Any changes since last visit?  no   Family History  Problem Relation Age of Onset  . Other Mother        complications from flu  . Other Father        unsure of cause  . Colon cancer Neg Hx    Social History   Socioeconomic History  . Marital status: Widowed    Spouse name: Not on file  . Number of children: 3  . Years of education: 16 years  . Highest education level: Not on file  Occupational History  . Occupation: Retired  Tobacco Use  . Smoking status: Former Smoker    Quit date: 02/03/1976    Years since quitting: 43.8  . Smokeless tobacco: Never Used  Vaping Use  . Vaping Use: Never used  Substance and Sexual Activity   . Alcohol use: No  . Drug use: No  . Sexual activity: Yes    Birth control/protection: Post-menopausal  Other Topics Concern  . Not on file  Social History Narrative   Lives at home with husband.   Right-handed.   No caffeine use.   Social Determinants of Health   Financial Resource Strain:   . Difficulty of Paying Living Expenses:   Food Insecurity:   . Worried About Charity fundraiser in the Last Year:   . Arboriculturist in the Last Year:   Transportation Needs:   . Film/video editor (Medical):   Marland Kitchen Lack of Transportation (Non-Medical):   Physical Activity:   . Days of Exercise per Week:   . Minutes of Exercise per Session:   Stress:   . Feeling of Stress :   Social Connections:   . Frequency of Communication with Friends and Family:   . Frequency of Social Gatherings with Friends and Family:   . Attends Religious Services:   . Active Member of Clubs or Organizations:   . Attends Archivist Meetings:   Marland Kitchen Marital Status:    Past Surgical History:  Procedure Laterality Date  . [name] STUDY N/A 06/21/2018   Procedure: [place] STUDY;  Surgeon: Lavena Bullion, DO;  Location: [facility];  Service: Gastroenterology;  Laterality: N/A;  with impedance  . ABDOMINAL HYSTERECTOMY  2010  . ANAL RECTAL MANOMETRY N/A 09/19/2019   Procedure: ANO RECTAL MANOMETRY;  Surgeon: Mauri Pole, MD;  Location: [facility];  Service: Endoscopy;  Laterality: N/A;  . APPENDECTOMY  1949  . [place]  . BREAST SURGERY    . CATARACT EXTRACTION Bilateral 2011  . CHOLECYSTECTOMY    . ESOPHAGEAL MANOMETRY N/A 06/21/2018   Procedure: ESOPHAGEAL MANOMETRY (EM);  Surgeon: Lavena Bullion, DO;  Location: [facility];  Service: Gastroenterology;  Laterality: N/A;  . GALLBLADDER SURGERY  2015  . JOINT REPLACEMENT     RT TOTAL HIP / RT TOTAL KNEE  . MASTECTOMY  1998   BILATERAL   . [place] IMPEDANCE STUDY  06/21/2018   Procedure: [place] IMPEDANCE STUDY;  Surgeon:  Lavena Bullion, DO;  Location: [facility];  Service: Gastroenterology;;  . SKIN CANCER EXCISION  2016   RT SIDE OF NOSE  . TONSILLECTOMY  1953  . TOTAL HIP ARTHROPLASTY  2010   RIGHT  . TOTAL HIP ARTHROPLASTY Left 05/09/2015   Procedure: LEFT TOTAL HIP ARTHROPLASTY ANTERIOR APPROACH;  Surgeon: Gaynelle Arabian, MD;  Location: [facility];  Service: Orthopedics;  Laterality: Left;  . TOTAL KNEE ARTHROPLASTY  2001  . TUMOR REMOVAL  2012   ABDOMINAL - NON CANCEROUS   Past Medical History:  Diagnosis Date  . Arthritis   . Cancer (HCC)    HX BREAST CANCER/ SKIN CANCER  . Complication of anesthesia    N/V WITH MORPHINE  . Difficulty sleeping   . Fractured hip ([place])    LEFT - AUG 2016  . GERD (gastroesophageal reflux disease)   . Hyperlipidemia   . Hypertension   . Melanoma ([place])   . Neuropathy   . Nocturia   . Osteopenia   . 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 PONV (postoperative nausea and vomiting)    PT STATES MORPHINE CAUSED N/V  . Rosacea   . Sleep apnea   . Stage 1 breast cancer, ER+, right (HCC) 07/04/2017   BP (!) 150/78   Pulse 84   Temp (!) 97.5 F (36.4 C)   Ht 5\' 6"  (1.676 m)   Wt 229 lb 9.6 oz (104.1 kg)   SpO2 96%   BMI 37.06 kg/m   Opioid Risk Score:   Fall Risk Score:  `1  Depression screen PHQ 2/9  Depression screen [facility] 2/9 11/22/2018 09/28/2018 06/02/2018 04/10/2018 02/20/2018 10/17/2017  Decreased Interest 0 0 0 0 0 0  Down, Depressed, Hopeless 0 0 0 0 0 0  PHQ - 2 Score 0 0 0 0 0 0  Altered sleeping - - - - - 3  Tired, decreased energy - - - - - 3  Change in appetite - - - - - 0  Feeling bad or failure about yourself  - - - - - 0  Trouble concentrating - - - - - 0  Moving slowly or fidgety/restless - - - - - 0  Suicidal thoughts - - - - - 0  PHQ-9 Score - - - - - 6  Difficult doing work/chores - - - - - Somewhat difficult     Review of Systems  Constitutional: Negative.   HENT: Negative.   Eyes: Negative.   Respiratory:  Negative.   Cardiovascular: Positive for leg swelling.  Gastrointestinal: Positive for constipation.  Endocrine: Negative.   Genitourinary: Negative.  Musculoskeletal: Positive for gait problem.  Skin: Negative.   Allergic/Immunologic: Negative.   Hematological: Negative.   Psychiatric/Behavioral: Negative.   All other systems reviewed and are negative.      Objective:   Physical Exam Vitals and nursing note reviewed.  Constitutional:      Appearance: Normal appearance.  Cardiovascular:     Rate and Rhythm: Normal rate and regular rhythm.     Pulses: Normal pulses.     Heart sounds: Normal heart sounds.  Pulmonary:     Effort: Pulmonary effort is normal.     Breath sounds: Normal breath sounds.  Musculoskeletal:     Cervical back: Normal range of motion and neck supple.     Comments: Normal Muscle Bulk and Muscle Testing Reveals:  Upper Extremities: Full ROM and Muscle Strength 5/5 Lumbar Paraspinal Tenderness: L-4-L-5 Greater Trochanteric Tenderness Lower Extremities: Full ROM and Muscle Strength 5/5  Arises from Table slowly using walker for support Narrow Based Gait   Skin:    General: Skin is warm and dry.  Neurological:     Mental Status: She is alert and oriented to person, place, and time.  Psychiatric:        Mood and Affect: Mood normal.        Behavior: Behavior normal.           Assessment & Plan:  1. Chronic Bilateral Leg pain: Paresthesia: Continue to Monitor.11/22/2019 2.Paresthesia /Lumbar Radiculitis:Ms. Harrellhasweaned herself off the Lyrica due to daytime drowsiness. We will continue to monitor.11/22/2019 3. Pain of Left Wrist/:No complaints today.:S/P Carpal Tunnel Release on 06/03/2017 by Dr. Ellene Route. Dr. Ellene Route Following.11/22/2019. 4. Fracture of superior pubic ramus.Dr. AluisioFollowing.Continue to monitor.11/22/2019. 5.BilateralKnee OA: Continue Voltaren Gel.Continue to monitor. Orthopedist following.11/22/2019. 6.  Polyarthralgia: Continue to alternate with heat and ice therapy. Continue current medication regime. Continue to monitor.11/22/2019. 7. Chronic Pain Syndrome:Refilled::Hydrocodone10/325 mg one tablet every 6 hours as needed for moderate pain #120.11/22/2019. 8. Lumbar Compression Fracture L2 and L3: Crystal Lee refused physical therapy.Continue with rest/ heat therapy. Continue to Monitor.11/22/2019. 9. Muscle Spasm: ContinueFlexeril 5 mg at HS. Continue to monitor.10/23/2019.  F/U in 1 Month  57minutes of face to face patient care time was spent during this visit. All questions were encouraged and answered.

## 2019-11-23 ENCOUNTER — Ambulatory Visit
Admission: RE | Admit: 2019-11-23 | Discharge: 2019-11-23 | Disposition: A | Discharge status: Home / Self Care | Payer: Medicare Other | Source: Ambulatory Visit | Attending: Hematology & Oncology | Admitting: Hematology & Oncology

## 2019-11-23 DIAGNOSIS — M545 Low back pain, unspecified: Secondary | ICD-10-CM

## 2019-11-23 MED ORDER — GADOBENATE DIMEGLUMINE 529 MG/ML IV SOLN
20.0000 mL | Freq: Once | INTRAVENOUS | Status: AC | PRN
  Administered 2019-11-23: 20 mL via INTRAVENOUS

## 2019-11-26 ENCOUNTER — Encounter: Payer: Self-pay | Admitting: *Deleted

## 2019-11-27 LAB — DRUG TOX MONITOR 1 W/CONF, ORAL FLD
Amphetamines: NEGATIVE ng/mL (ref <10)
Barbiturates: NEGATIVE ng/mL (ref <10)
Benzodiazepines: NEGATIVE ng/mL (ref <0.50)
Buprenorphine: NEGATIVE ng/mL (ref <0.10)
Cocaine: NEGATIVE ng/mL (ref <5.0)
Codeine: NEGATIVE ng/mL (ref <2.5)
Dihydrocodeine: 16.2 ng/mL — ABNORMAL HIGH (ref <2.5)
Fentanyl: NEGATIVE ng/mL (ref <0.10)
Heroin Metabolite: NEGATIVE ng/mL (ref <1.0)
Hydrocodone: 240.3 ng/mL — ABNORMAL HIGH (ref <2.5)
Hydromorphone: NEGATIVE ng/mL (ref <2.5)
MARIJUANA: NEGATIVE ng/mL (ref <2.5)
MDMA: NEGATIVE ng/mL (ref <10)
Meprobamate: NEGATIVE ng/mL (ref <2.5)
Methadone: NEGATIVE ng/mL (ref <5.0)
Morphine: NEGATIVE ng/mL (ref <2.5)
Nicotine Metabolite: NEGATIVE ng/mL (ref <5.0)
Norhydrocodone: 9 ng/mL — ABNORMAL HIGH (ref <2.5)
Noroxycodone: NEGATIVE ng/mL (ref <2.5)
Opiates: POSITIVE ng/mL — AB (ref <2.5)
Oxycodone: NEGATIVE ng/mL (ref <2.5)
Oxymorphone: NEGATIVE ng/mL (ref <2.5)
Phencyclidine: NEGATIVE ng/mL (ref <10)
Tapentadol: NEGATIVE ng/mL (ref <5.0)
Tramadol: NEGATIVE ng/mL (ref <5.0)
Zolpidem: NEGATIVE ng/mL (ref <5.0)

## 2019-11-27 LAB — DRUG TOX ALC METAB W/CON, ORAL FLD: Alcohol Metabolite: NEGATIVE ng/mL (ref <25)

## 2019-12-05 ENCOUNTER — Inpatient Hospital Stay: Payer: Medicare Other

## 2019-12-05 ENCOUNTER — Inpatient Hospital Stay: Payer: Medicare Other | Attending: Hematology & Oncology

## 2019-12-05 ENCOUNTER — Inpatient Hospital Stay (HOSPITAL_BASED_OUTPATIENT_CLINIC_OR_DEPARTMENT_OTHER): Payer: Medicare Other | Admitting: Hematology & Oncology

## 2019-12-05 ENCOUNTER — Encounter: Payer: Self-pay | Admitting: Hematology & Oncology

## 2019-12-05 ENCOUNTER — Other Ambulatory Visit: Payer: Self-pay

## 2019-12-05 ENCOUNTER — Telehealth: Payer: Self-pay | Admitting: Hematology & Oncology

## 2019-12-05 VITALS — BP 135/67 | HR 84 | Temp 98.1°F | Resp 18 | Wt 228.8 lb

## 2019-12-05 DIAGNOSIS — R945 Abnormal results of liver function studies: Secondary | ICD-10-CM | POA: Insufficient documentation

## 2019-12-05 DIAGNOSIS — E785 Hyperlipidemia, unspecified: Secondary | ICD-10-CM | POA: Diagnosis not present

## 2019-12-05 DIAGNOSIS — M4856XA Collapsed vertebra, not elsewhere classified, lumbar region, initial encounter for fracture: Secondary | ICD-10-CM | POA: Diagnosis not present

## 2019-12-05 DIAGNOSIS — Z853 Personal history of malignant neoplasm of breast: Secondary | ICD-10-CM | POA: Diagnosis not present

## 2019-12-05 DIAGNOSIS — Z79899 Other long term (current) drug therapy: Secondary | ICD-10-CM | POA: Insufficient documentation

## 2019-12-05 DIAGNOSIS — M545 Low back pain, unspecified: Secondary | ICD-10-CM

## 2019-12-05 DIAGNOSIS — M810 Age-related osteoporosis without current pathological fracture: Secondary | ICD-10-CM | POA: Diagnosis not present

## 2019-12-05 DIAGNOSIS — Z170 Estrogen receptor positive status [ER+]: Secondary | ICD-10-CM | POA: Diagnosis not present

## 2019-12-05 DIAGNOSIS — C50911 Malignant neoplasm of unspecified site of right female breast: Secondary | ICD-10-CM

## 2019-12-05 LAB — CBC WITH DIFFERENTIAL (CANCER CENTER ONLY)
Abs Immature Granulocytes: 0.01 10*3/uL (ref 0.00–0.07)
Basophils Absolute: 0 10*3/uL (ref 0.0–0.1)
Basophils Relative: 0 %
Eosinophils Absolute: 0.3 10*3/uL (ref 0.0–0.5)
Eosinophils Relative: 4 %
HCT: 41.8 % (ref 36.0–46.0)
Hemoglobin: 13.4 g/dL (ref 12.0–15.0)
Immature Granulocytes: 0 %
Lymphocytes Relative: 39 %
Lymphs Abs: 2.4 10*3/uL (ref 0.7–4.0)
MCH: 29.5 pg (ref 26.0–34.0)
MCHC: 32.1 g/dL (ref 30.0–36.0)
MCV: 91.9 fL (ref 80.0–100.0)
Monocytes Absolute: 0.5 10*3/uL (ref 0.1–1.0)
Monocytes Relative: 8 %
Neutro Abs: 3 10*3/uL (ref 1.7–7.7)
Neutrophils Relative %: 49 %
Platelet Count: 115 10*3/uL — ABNORMAL LOW (ref 150–400)
RBC: 4.55 MIL/uL (ref 3.87–5.11)
RDW: 13.2 % (ref 11.5–15.5)
WBC Count: 6.1 10*3/uL (ref 4.0–10.5)
nRBC: 0 % (ref 0.0–0.2)

## 2019-12-05 LAB — CMP (CANCER CENTER ONLY)
ALT: 28 U/L (ref 0–44)
AST: 32 U/L (ref 15–41)
Albumin: 4.1 g/dL (ref 3.5–5.0)
Alkaline Phosphatase: 88 U/L (ref 38–126)
Anion gap: 5 (ref 5–15)
BUN: 15 mg/dL (ref 8–23)
CO2: 31 mmol/L (ref 22–32)
Calcium: 10.3 mg/dL (ref 8.9–10.3)
Chloride: 103 mmol/L (ref 98–111)
Creatinine: 0.74 mg/dL (ref 0.44–1.00)
GFR, Est AFR Am: >60 mL/min (ref >60)
GFR, Estimated: >60 mL/min (ref >60)
Glucose, Bld: 111 mg/dL — ABNORMAL HIGH (ref 70–99)
Potassium: 5.3 mmol/L — ABNORMAL HIGH (ref 3.5–5.1)
Sodium: 139 mmol/L (ref 135–145)
Total Bilirubin: 0.6 mg/dL (ref 0.3–1.2)
Total Protein: 7.1 g/dL (ref 6.5–8.1)

## 2019-12-05 LAB — LIPID PANEL
Cholesterol: 175 mg/dL (ref 0–200)
HDL: 36 mg/dL — ABNORMAL LOW (ref >40)
LDL Cholesterol: 102 mg/dL — ABNORMAL HIGH (ref 0–99)
Total CHOL/HDL Ratio: 4.9 RATIO
Triglycerides: 184 mg/dL — ABNORMAL HIGH (ref <150)
VLDL: 37 mg/dL (ref 0–40)

## 2019-12-05 MED ORDER — ERGOCALCIFEROL 1.25 MG (50000 UT) PO CAPS: 50000.0000 [IU] | ORAL_CAPSULE | ORAL | 3 refills | Status: DC

## 2019-12-05 NOTE — Telephone Encounter (Signed)
Appointments scheduled calendar printed per 7/7 los °

## 2019-12-05 NOTE — Progress Notes (Signed)
Hematology and Oncology Follow Up Visit  Crystal Lee [id number] [birth year] 86 y.o. 12/05/2019   Principle Diagnosis:  Bilateral stage Ia breast cancer-1998; thoracic adenopathy of undetermined significance Osteoporosis-aromatase inhibitor induced  Current Therapy:   Zometa 4 mg IV q. year   Interim History:  Crystal Lee is here today for follow-up.  We did do an MRI of her back.  This is of the lumbar spine.  The MRI did not show any malignancy.  However she does have the compression fractures.  She has had past kyphoplasty at L3.  She has seen Dr. Kristeen Miss of neurosurgery before.  She will go back and see him and see if he cannot help with the pain.  Otherwise she is doing okay.  She has had no problems with cough or shortness of breath.  She has had no nausea or vomiting.  Is been no issues with fever.  She has had no problems with the coronavirus.  We did get her off her statin drug because of elevated liver function studies.  Her liver function studies have improved.  However, her cholesterol is back up.  Her cholesterol/HDL ratio is now 4.9.  I left a message for her a family doctor, Dr. Lorelei Pont, to see if a new cholesterol drug could be given to her.  Currently, her performance status is probably [name].     Medications:  Allergies as of 12/05/2019      Reactions   Adhesive [tape]    Morphine And Related Nausea And Vomiting      Medication List       Accurate as of December 05, 2019  1:35 PM. If you have any questions, ask your nurse or doctor.        AMBULATORY NON FORMULARY MEDICATION Take 10 mLs by mouth every 4 (four) hours as needed. Medication Name: Viscous Lidocaine, Bentyl, Maalox (equal parts) Take 72mL by mouth everey 4 hours as needed.   atorvastatin 20 MG tablet Commonly known as: LIPITOR Take 1 tablet (20 mg total) by mouth daily.   CALCIUM PO Take 1,200 mg by mouth daily.   cyclobenzaprine 5 MG tablet Commonly known as: FLEXERIL TAKE ONE TABLET BY  MOUTH AT BEDTIME AS NEEDED FOR MUSCLE SPASMS   ergocalciferol 1.25 MG (50000 UT) capsule Commonly known as: VITAMIN D2 Take 1 capsule (50,000 Units total) by mouth once a week.   furosemide 20 MG tablet Commonly known as: LASIX Take 1 tablet (20 mg total) by mouth daily.   HYDROcodone-acetaminophen 10-325 MG tablet Commonly known as: NORCO Take 1 tablet by mouth every 6 (six) hours as needed.   Linzess 290 MCG Caps capsule Generic drug: linaclotide Take 1 capsule (290 mcg total) by mouth daily.   losartan 25 MG tablet Commonly known as: COZAAR Take 1 tablet (25 mg total) by mouth daily.   metroNIDAZOLE 1 % gel Commonly known as: Metrogel Apply topically daily. Use as needed for rosacea   ondansetron 4 MG disintegrating tablet Commonly known as: ZOFRAN-ODT as needed.   potassium chloride 10 MEQ tablet Commonly known as: KLOR-CON Take 1 tablet (10 mEq total) by mouth daily.   Trulance 3 MG Tabs Generic drug: Plecanatide Take 3 mg by mouth daily.   Vitamin D 50 MCG (2000 UT) tablet Take 2,000 Units by mouth.       Allergies:  Allergies  Allergen Reactions  . Adhesive [Tape]   . Morphine And Related Nausea And Vomiting    Past Medical History, Surgical history,  Social history, and Family History were reviewed and updated.  Review of Systems: Review of Systems  Constitutional: Negative.   HENT: Negative.   Eyes: Negative.   Respiratory: Negative.   Cardiovascular: Positive for leg swelling.  Gastrointestinal: Positive for constipation.  Genitourinary: Negative.   Musculoskeletal: Negative.   Skin: Negative.   Neurological: Negative.   Endo/Heme/Allergies: Negative.   Psychiatric/Behavioral: Negative.      Physical Exam:  weight is 228 lb 12 oz (103.8 kg). Her oral temperature is 98.1 F (36.7 C). Her blood pressure is 135/67 and her pulse is 84. Her respiration is 18 and oxygen saturation is 100%.   Wt Readings from Last 3 Encounters:  12/05/19  228 lb 12 oz (103.8 kg)  11/22/19 229 lb 9.6 oz (104.1 kg)  10/23/19 229 lb (103.9 kg)    Physical Exam Vitals reviewed.  HENT:     Head: Normocephalic and atraumatic.  Eyes:     Pupils: Pupils are equal, round, and reactive to light.  Cardiovascular:     Rate and Rhythm: Normal rate and regular rhythm.     Heart sounds: Normal heart sounds.  Pulmonary:     Effort: Pulmonary effort is normal.     Breath sounds: Normal breath sounds.  Abdominal:     General: Bowel sounds are normal.     Palpations: Abdomen is soft.  Musculoskeletal:        General: No tenderness or deformity. Normal range of motion.     Cervical back: Normal range of motion.  Lymphadenopathy:     Cervical: No cervical adenopathy.  Skin:    General: Skin is warm and dry.     Findings: No erythema or rash.  Neurological:     Mental Status: She is alert and oriented to person, place, and time.  Psychiatric:        Behavior: Behavior normal.        Thought Content: Thought content normal.        Judgment: Judgment normal.      Lab Results  Component Value Date   WBC 6.1 12/05/2019   HGB 13.4 12/05/2019   HCT 41.8 12/05/2019   MCV 91.9 12/05/2019   PLT 115 (L) 12/05/2019   Lab Results  Component Value Date   FERRITIN 131 02/09/2017   Lab Results  Component Value Date   RBC 4.55 12/05/2019   No results found for: [name] [facility] Lab Results  Component Value Date   IGGSERUM 1,085 02/09/2017   IGMSERUM 336 (H) 02/09/2017   No results found for: [name], SPEI   Chemistry      Component Value Date/Time   NA 139 12/05/2019 1251   NA 140 08/10/2019 1453   K 5.3 (H) 12/05/2019 1251   CL 103 12/05/2019 1251   CO2 31 12/05/2019 1251   BUN 15 12/05/2019 1251   BUN 16 08/10/2019 1453   CREATININE 0.74 12/05/2019 1251      Component Value Date/Time   CALCIUM 10.3 12/05/2019 1251   ALKPHOS 88 12/05/2019 1251   AST  32 12/05/2019 1251   ALT 28 12/05/2019 1251   BILITOT 0.6 12/05/2019 1251       Impression and Plan: Crystal Lee is a very pleasant 86 yo caucasian female with history of bilateral stage Ia breast cancer diagnosed back in 1998.  I do not see any issues with respect to the breast cancer.  I just hope that the back can be  fixed.  At least, some of the pain could be improved.  For right now, we will just plan to get her back to see [place] in 4 months.  I feel very confident about this.  I forgot to mention that she like to have Reclast/Zometa given in our office.  She does have osteoporosis.  Once we get the approval for the Zometa, we will have her come back for this.[name], MD 7/7/20211:35 PM

## 2019-12-06 ENCOUNTER — Telehealth: Payer: Self-pay | Admitting: *Deleted

## 2019-12-06 NOTE — Telephone Encounter (Signed)
As noted below by Dr. Marin Olp, I informed the patient that her cholesterol is back up. We will let Dr. Edilia Bo know. She verbalized understanding.

## 2019-12-06 NOTE — Telephone Encounter (Signed)
-----   Message from Crystal Napoleon, MD sent at 12/05/2019  5:14 PM EDT ----- Call - the cholesterol is back up. I will let Dr. Lorelei Pont know!  Laurey Arrow

## 2019-12-07 ENCOUNTER — Encounter: Payer: Self-pay | Admitting: Family Medicine

## 2019-12-07 ENCOUNTER — Telehealth: Payer: Self-pay | Admitting: *Deleted

## 2019-12-07 NOTE — Telephone Encounter (Signed)
Oral swab drug screen was consistent for prescribed medications.  ?

## 2019-12-10 DIAGNOSIS — B078 Other viral warts: Secondary | ICD-10-CM | POA: Diagnosis not present

## 2019-12-10 DIAGNOSIS — Z85828 Personal history of other malignant neoplasm of skin: Secondary | ICD-10-CM | POA: Diagnosis not present

## 2019-12-10 DIAGNOSIS — D225 Melanocytic nevi of trunk: Secondary | ICD-10-CM | POA: Diagnosis not present

## 2019-12-10 DIAGNOSIS — L905 Scar conditions and fibrosis of skin: Secondary | ICD-10-CM | POA: Diagnosis not present

## 2019-12-10 DIAGNOSIS — D229 Melanocytic nevi, unspecified: Secondary | ICD-10-CM | POA: Diagnosis not present

## 2019-12-10 DIAGNOSIS — D1801 Hemangioma of skin and subcutaneous tissue: Secondary | ICD-10-CM | POA: Diagnosis not present

## 2019-12-10 DIAGNOSIS — L821 Other seborrheic keratosis: Secondary | ICD-10-CM | POA: Diagnosis not present

## 2019-12-10 DIAGNOSIS — L570 Actinic keratosis: Secondary | ICD-10-CM | POA: Diagnosis not present

## 2019-12-10 DIAGNOSIS — L218 Other seborrheic dermatitis: Secondary | ICD-10-CM | POA: Diagnosis not present

## 2019-12-11 ENCOUNTER — Inpatient Hospital Stay: Payer: Medicare Other

## 2019-12-11 ENCOUNTER — Telehealth: Payer: Self-pay | Admitting: *Deleted

## 2019-12-11 ENCOUNTER — Other Ambulatory Visit: Payer: Self-pay

## 2019-12-11 VITALS — BP 132/61 | HR 80 | Temp 98.8°F | Resp 18

## 2019-12-11 DIAGNOSIS — T386X5A Adverse effect of antigonadotrophins, antiestrogens, antiandrogens, not elsewhere classified, initial encounter: Secondary | ICD-10-CM

## 2019-12-11 DIAGNOSIS — Z79899 Other long term (current) drug therapy: Secondary | ICD-10-CM | POA: Diagnosis not present

## 2019-12-11 DIAGNOSIS — M8589 Other specified disorders of bone density and structure, multiple sites: Secondary | ICD-10-CM

## 2019-12-11 DIAGNOSIS — M4856XA Collapsed vertebra, not elsewhere classified, lumbar region, initial encounter for fracture: Secondary | ICD-10-CM | POA: Diagnosis not present

## 2019-12-11 DIAGNOSIS — R945 Abnormal results of liver function studies: Secondary | ICD-10-CM | POA: Diagnosis not present

## 2019-12-11 DIAGNOSIS — M810 Age-related osteoporosis without current pathological fracture: Secondary | ICD-10-CM | POA: Diagnosis not present

## 2019-12-11 DIAGNOSIS — Z853 Personal history of malignant neoplasm of breast: Secondary | ICD-10-CM | POA: Diagnosis not present

## 2019-12-11 MED ORDER — SODIUM CHLORIDE 0.9 % IV SOLN
Freq: Once | INTRAVENOUS | Status: AC
  Filled 2019-12-11: qty 250

## 2019-12-11 MED ORDER — POTASSIUM CHLORIDE ER 10 MEQ PO TBCR: 10.0000 meq | EXTENDED_RELEASE_TABLET | Freq: Every day | ORAL | 3 refills | Status: DC

## 2019-12-11 MED ORDER — ZOLEDRONIC ACID 4 MG/100ML IV SOLN
4.0000 mg | Freq: Once | INTRAVENOUS | Status: AC
  Administered 2019-12-11: 4 mg via INTRAVENOUS
  Filled 2019-12-11: qty 100

## 2019-12-11 NOTE — Patient Instructions (Signed)
Zoledronic Acid injection (Hypercalcemia, Oncology) What is this medicine? ZOLEDRONIC ACID (ZOE le dron ik AS id) lowers the amount of calcium loss from bone. It is used to treat too much calcium in your blood from cancer. It is also used to prevent complications of cancer that has spread to the bone. This medicine may be used for other purposes; ask your health care provider or pharmacist if you have questions. COMMON BRAND NAME(S): Zometa What should I tell my health care provider before I take this medicine? They need to know if you have any of these conditions:  aspirin-sensitive asthma  cancer, especially if you are receiving medicines used to treat cancer  dental disease or wear dentures  infection  kidney disease  receiving corticosteroids like dexamethasone or prednisone  an unusual or allergic reaction to zoledronic acid, other medicines, foods, dyes, or preservatives  pregnant or trying to get pregnant  breast-feeding How should I use this medicine? This medicine is for infusion into a vein. It is given by a health care professional in a hospital or clinic setting. Talk to your pediatrician regarding the use of this medicine in children. Special care may be needed. Overdosage: If you think you have taken too much of this medicine contact a poison control center or emergency room at once. NOTE: This medicine is only for you. Do not share this medicine with others. What if I miss a dose? It is important not to miss your dose. Call your doctor or health care professional if you are unable to keep an appointment. What may interact with this medicine?  certain antibiotics given by injection  NSAIDs, medicines for pain and inflammation, like ibuprofen or naproxen  some diuretics like bumetanide, furosemide  teriparatide  thalidomide This list may not describe all possible interactions. Give your health care provider a list of all the medicines, herbs, non-prescription  drugs, or dietary supplements you use. Also tell them if you smoke, drink alcohol, or use illegal drugs. Some items may interact with your medicine. What should I watch for while using this medicine? Visit your doctor or health care professional for regular checkups. It may be some time before you see the benefit from this medicine. Do not stop taking your medicine unless your doctor tells you to. Your doctor may order blood tests or other tests to see how you are doing. Women should inform their doctor if they wish to become pregnant or think they might be pregnant. There is a potential for serious side effects to an unborn child. Talk to your health care professional or pharmacist for more information. You should make sure that you get enough calcium and vitamin D while you are taking this medicine. Discuss the foods you eat and the vitamins you take with your health care professional. Some people who take this medicine have severe bone, joint, and/or muscle pain. This medicine may also increase your risk for jaw problems or a broken thigh bone. Tell your doctor right away if you have severe pain in your jaw, bones, joints, or muscles. Tell your doctor if you have any pain that does not go away or that gets worse. Tell your dentist and dental surgeon that you are taking this medicine. You should not have major dental surgery while on this medicine. See your dentist to have a dental exam and fix any dental problems before starting this medicine. Take good care of your teeth while on this medicine. Make sure you see your dentist for regular follow-up   appointments. What side effects may I notice from receiving this medicine? Side effects that you should report to your doctor or health care professional as soon as possible:  allergic reactions like skin rash, itching or hives, swelling of the face, lips, or tongue  anxiety, confusion, or depression  breathing problems  changes in vision  eye  pain  feeling faint or lightheaded, falls  jaw pain, especially after dental work  mouth sores  muscle cramps, stiffness, or weakness  redness, blistering, peeling or loosening of the skin, including inside the mouth  trouble passing urine or change in the amount of urine Side effects that usually do not require medical attention (report to your doctor or health care professional if they continue or are bothersome):  bone, joint, or muscle pain  constipation  diarrhea  fever  hair loss  irritation at site where injected  loss of appetite  nausea, vomiting  stomach upset  trouble sleeping  trouble swallowing  weak or tired This list may not describe all possible side effects. Call your doctor for medical advice about side effects. You may report side effects to FDA at 1-800-FDA-1088. Where should I keep my medicine? This drug is given in a hospital or clinic and will not be stored at home. NOTE: This sheet is a summary. It may not cover all possible information. If you have questions about this medicine, talk to your doctor, pharmacist, or health care provider.  2020 Elsevier/Gold Standard (2013-10-13 14:19:39)  

## 2019-12-11 NOTE — Telephone Encounter (Signed)
Rx refill sent to pharmacy. 

## 2019-12-12 DIAGNOSIS — M25531 Pain in right wrist: Secondary | ICD-10-CM | POA: Diagnosis not present

## 2019-12-12 DIAGNOSIS — M654 Radial styloid tenosynovitis [de Quervain]: Secondary | ICD-10-CM | POA: Diagnosis not present

## 2019-12-24 ENCOUNTER — Encounter: Payer: Self-pay | Admitting: Registered Nurse

## 2019-12-24 ENCOUNTER — Other Ambulatory Visit: Payer: Self-pay

## 2019-12-24 ENCOUNTER — Encounter: Payer: Medicare Other | Attending: Physical Medicine & Rehabilitation | Admitting: Registered Nurse

## 2019-12-24 VITALS — BP 148/76 | HR 75 | Temp 98.8°F | Ht 66.0 in | Wt 229.0 lb

## 2019-12-24 DIAGNOSIS — M5416 Radiculopathy, lumbar region: Secondary | ICD-10-CM | POA: Insufficient documentation

## 2019-12-24 DIAGNOSIS — Z9013 Acquired absence of bilateral breasts and nipples: Secondary | ICD-10-CM | POA: Insufficient documentation

## 2019-12-24 DIAGNOSIS — Z9889 Other specified postprocedural states: Secondary | ICD-10-CM | POA: Diagnosis not present

## 2019-12-24 DIAGNOSIS — M7062 Trochanteric bursitis, left hip: Secondary | ICD-10-CM | POA: Diagnosis not present

## 2019-12-24 DIAGNOSIS — Z5181 Encounter for therapeutic drug level monitoring: Secondary | ICD-10-CM | POA: Diagnosis not present

## 2019-12-24 DIAGNOSIS — M7061 Trochanteric bursitis, right hip: Secondary | ICD-10-CM | POA: Diagnosis not present

## 2019-12-24 DIAGNOSIS — I10 Essential (primary) hypertension: Secondary | ICD-10-CM | POA: Insufficient documentation

## 2019-12-24 DIAGNOSIS — M545 Low back pain: Secondary | ICD-10-CM | POA: Insufficient documentation

## 2019-12-24 DIAGNOSIS — Z760 Encounter for issue of repeat prescription: Secondary | ICD-10-CM | POA: Insufficient documentation

## 2019-12-24 DIAGNOSIS — Z9049 Acquired absence of other specified parts of digestive tract: Secondary | ICD-10-CM | POA: Insufficient documentation

## 2019-12-24 DIAGNOSIS — M4856XA Collapsed vertebra, not elsewhere classified, lumbar region, initial encounter for fracture: Secondary | ICD-10-CM | POA: Insufficient documentation

## 2019-12-24 DIAGNOSIS — Z8489 Family history of other specified conditions: Secondary | ICD-10-CM | POA: Diagnosis not present

## 2019-12-24 DIAGNOSIS — M79605 Pain in left leg: Secondary | ICD-10-CM | POA: Insufficient documentation

## 2019-12-24 DIAGNOSIS — Z85820 Personal history of malignant melanoma of skin: Secondary | ICD-10-CM | POA: Insufficient documentation

## 2019-12-24 DIAGNOSIS — M79604 Pain in right leg: Secondary | ICD-10-CM | POA: Diagnosis not present

## 2019-12-24 DIAGNOSIS — Z96643 Presence of artificial hip joint, bilateral: Secondary | ICD-10-CM | POA: Insufficient documentation

## 2019-12-24 DIAGNOSIS — M1712 Unilateral primary osteoarthritis, left knee: Secondary | ICD-10-CM | POA: Insufficient documentation

## 2019-12-24 DIAGNOSIS — M7918 Myalgia, other site: Secondary | ICD-10-CM | POA: Insufficient documentation

## 2019-12-24 DIAGNOSIS — K219 Gastro-esophageal reflux disease without esophagitis: Secondary | ICD-10-CM | POA: Insufficient documentation

## 2019-12-24 DIAGNOSIS — G4730 Sleep apnea, unspecified: Secondary | ICD-10-CM | POA: Insufficient documentation

## 2019-12-24 DIAGNOSIS — M8580 Other specified disorders of bone density and structure, unspecified site: Secondary | ICD-10-CM | POA: Insufficient documentation

## 2019-12-24 DIAGNOSIS — M170 Bilateral primary osteoarthritis of knee: Secondary | ICD-10-CM

## 2019-12-24 DIAGNOSIS — G894 Chronic pain syndrome: Secondary | ICD-10-CM | POA: Diagnosis not present

## 2019-12-24 DIAGNOSIS — Z87891 Personal history of nicotine dependence: Secondary | ICD-10-CM | POA: Insufficient documentation

## 2019-12-24 DIAGNOSIS — E785 Hyperlipidemia, unspecified: Secondary | ICD-10-CM | POA: Diagnosis not present

## 2019-12-24 DIAGNOSIS — Z79899 Other long term (current) drug therapy: Secondary | ICD-10-CM | POA: Insufficient documentation

## 2019-12-24 DIAGNOSIS — Z853 Personal history of malignant neoplasm of breast: Secondary | ICD-10-CM | POA: Insufficient documentation

## 2019-12-24 DIAGNOSIS — M2550 Pain in unspecified joint: Secondary | ICD-10-CM

## 2019-12-24 DIAGNOSIS — Z96651 Presence of right artificial knee joint: Secondary | ICD-10-CM | POA: Insufficient documentation

## 2019-12-24 DIAGNOSIS — R202 Paresthesia of skin: Secondary | ICD-10-CM | POA: Diagnosis not present

## 2019-12-24 DIAGNOSIS — M961 Postlaminectomy syndrome, not elsewhere classified: Secondary | ICD-10-CM

## 2019-12-24 DIAGNOSIS — S32000S Wedge compression fracture of unspecified lumbar vertebra, sequela: Secondary | ICD-10-CM

## 2019-12-24 DIAGNOSIS — Z8673 Personal history of transient ischemic attack (TIA), and cerebral infarction without residual deficits: Secondary | ICD-10-CM | POA: Insufficient documentation

## 2019-12-24 DIAGNOSIS — M62838 Other muscle spasm: Secondary | ICD-10-CM | POA: Insufficient documentation

## 2019-12-24 MED ORDER — HYDROCODONE-ACETAMINOPHEN 10-325 MG PO TABS: 1.0000 | ORAL_TABLET | Freq: Four times a day (QID) | ORAL | 0 refills | Status: DC | PRN

## 2019-12-24 NOTE — Progress Notes (Signed)
Subjective:    Patient ID: Crystal Lee, female    DOB: 1934/01/13, 86 y.o.   MRN: [id number]  HPI: Crystal Lee is a 86 y.o. female who returns for follow up appointment for chronic pain and medication refill. She states her pain is located in her lower back, bilateral hips, bilateral lower extremities and bilateral knee pain. Also reports generalized joint pain. She rates her pain 5. Her current exercise regime is walking and performing stretching exercises.  Crystal Lee is 40.00 MME.    Last Oral Swab was Performed on 11/22/2019, it was consistent.   Pain Inventory Average Pain 5 Pain Right Now 5 My pain is constant, burning, tingling and aching  In the last 24 hours, has pain interfered with the following? General activity 5 Relation with others Patient cannot answer. Enjoyment of life Patient cannot answer. What TIME of day is your pain at its worst? evening Sleep (in general) Good, Fair & Poor  Pain is worse with: bending and standing Pain improves with: rest, heat/ice and medication Relief from Meds: Good to Fair  Mobility walk with assistance use a cane use a walker how many minutes can you walk? 5 mins ability to climb steps?  no do you drive?  yes  Function retired I need assistance with the following:  bathing, meal prep, household duties and shopping Do you have any goals in this area?  yes  Neuro/Psych bowel control problems weakness numbness tingling trouble walking  Prior Studies Any changes since last visit?  yes CT/MRI  Physicians involved in your care Any changes since last visit?  no   Family History  Problem Relation Age of Onset  . Other Mother        complications from flu  . Other Father        unsure of cause  . Colon cancer Neg Hx    Social History   Socioeconomic History  . Marital status: Widowed    Spouse name: Not on file  . Number of children: 3  . Years of education: 16 years  . Highest  education level: Not on file  Occupational History  . Occupation: Retired  Tobacco Use  . Smoking status: Former Smoker    Quit date: 02/03/1976    Years since quitting: 43.9  . Smokeless tobacco: Never Used  Vaping Use  . Vaping Use: Never used  Substance and Sexual Activity  . Alcohol use: No  . Drug use: No  . Sexual activity: Yes    Birth control/protection: Post-menopausal  Other Topics Concern  . Not on file  Social History Narrative   Lives at home with husband.   Right-handed.   No caffeine use.   Social Determinants of Health   Financial Resource Strain:   . Difficulty of Paying Living Expenses:   Food Insecurity:   . Worried About Charity fundraiser in the Last Year:   . Arboriculturist in the Last Year:   Transportation Needs:   . Film/video editor (Medical):   Marland Kitchen Lack of Transportation (Non-Medical):   Physical Activity:   . Days of Exercise per Week:   . Minutes of Exercise per Session:   Stress:   . Feeling of Stress :   Social Connections:   . Frequency of Communication with Friends and Family:   . Frequency of Social Gatherings with Friends and Family:   . Attends Religious Services:   . Active Member of Clubs or Organizations:   .  Attends Archivist Meetings:   Marland Kitchen Marital Status:    Past Surgical History:  Procedure Laterality Date  . [place] STUDY N/A 06/21/2018   Procedure: [place] STUDY;  Surgeon: Lavena Bullion, DO;  Location: [facility];  Service: Gastroenterology;  Laterality: N/A;  with impedance  . ABDOMINAL HYSTERECTOMY  2010  . ANAL RECTAL MANOMETRY N/A 09/19/2019   Procedure: ANO RECTAL MANOMETRY;  Surgeon: Mauri Pole, MD;  Location: [facility];  Service: Endoscopy;  Laterality: N/A;  . APPENDECTOMY  1949  . [place]  . BREAST SURGERY    . CATARACT EXTRACTION Bilateral 2011  . CHOLECYSTECTOMY    . ESOPHAGEAL MANOMETRY N/A 06/21/2018   Procedure: ESOPHAGEAL MANOMETRY (EM);  Surgeon:  Lavena Bullion, DO;  [facility];  Service: Gastroenterology;  Laterality: N/A;  . GALLBLADDER SURGERY  2015  . JOINT REPLACEMENT     RT TOTAL HIP / RT TOTAL KNEE  . MASTECTOMY  1998   BILATERAL   . [place] IMPEDANCE STUDY  06/21/2018   Procedure: [place] IMPEDANCE STUDY;  Surgeon: Lavena Bullion, DO;  [facility]: [facility];  Service: Gastroenterology;;  . SKIN CANCER EXCISION  2016   RT SIDE OF NOSE  . TONSILLECTOMY  1953  . TOTAL HIP ARTHROPLASTY  2010   RIGHT  . TOTAL HIP ARTHROPLASTY Left 05/09/2015   Procedure: LEFT TOTAL HIP ARTHROPLASTY ANTERIOR APPROACH;  Surgeon: Gaynelle Arabian, MD;  Location: [facility];  Service: Orthopedics;  Laterality: Left;  . TOTAL KNEE ARTHROPLASTY  2001  . TUMOR REMOVAL  2012   ABDOMINAL - NON CANCEROUS   Past Medical History:  Diagnosis Date  . Arthritis   . Cancer (HCC)    HX BREAST CANCER/ SKIN CANCER  . Complication of anesthesia    N/V WITH MORPHINE  . Difficulty sleeping   . Fractured hip ([place])    LEFT - AUG 2016  . GERD (gastroesophageal reflux disease)   . Hyperlipidemia   . Hypertension   . Melanoma ([place])   . Neuropathy   . Nocturia   . Osteopenia   . 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 PONV (postoperative nausea and vomiting)    PT STATES MORPHINE CAUSED N/V  . Rosacea   . Sleep apnea   . Stage 1 breast cancer, ER+, right (HCC) 07/04/2017   BP (!) 148/76   Pulse 75   Temp 98.8 F (37.1 C)   Ht 5\' 6"  (1.676 m)   Wt (!) 229 lb (103.9 kg)   SpO2 95%   BMI 36.96 kg/m   Opioid Risk Score:   Fall Risk Score:  `1  Depression screen PHQ 2/9  Depression screen [facility] 2/9 12/24/2019 11/22/2018 09/28/2018 06/02/2018 04/10/2018 02/20/2018 10/17/2017  Decreased Interest 0 0 0 0 0 0 0  Down, Depressed, Hopeless 0 0 0 0 0 0 0  PHQ - 2 Score 0 0 0 0 0 0 0  Altered sleeping - - - - - - 3  Tired, decreased energy - - - - - - 3  Change in appetite - - - - - - 0  Feeling bad or failure about yourself  - - - - - - 0    Trouble concentrating - - - - - - 0  Moving slowly or fidgety/restless - - - - - - 0  Suicidal thoughts - - - - - - 0  PHQ-9 Score - - - - - - 6  Difficult doing work/chores - - - - - - Somewhat difficult   Review of Systems  Constitutional: Negative.   HENT: Negative.   Eyes: Negative.   Respiratory: Negative.   Cardiovascular: Positive for leg swelling.  Gastrointestinal: Positive for constipation.  Endocrine: Negative.   Genitourinary: Negative.   Musculoskeletal: Positive for back pain and gait problem.  Skin: Negative.   Allergic/Immunologic: Negative.   Neurological: Positive for weakness and numbness.  Hematological: Negative.   Psychiatric/Behavioral: Negative.   All other systems reviewed and are negative.      Objective:   Physical Exam Vitals and nursing note reviewed.  Constitutional:      Appearance: Normal appearance.  Cardiovascular:     Rate and Rhythm: Normal rate and regular rhythm.     Pulses: Normal pulses.     Heart sounds: Normal heart sounds.  Pulmonary:     Effort: Pulmonary effort is normal.     Breath sounds: Normal breath sounds.  Musculoskeletal:     Cervical back: Normal range of motion and neck supple.     Comments: Normal Muscle Bulk and Muscle Testing Reveals:  Upper Extremities: Full ROM and Muscle Strength 5/5 Lumbar Hypersensitivity Bilateral Greater Trochanteric Tenderness Lower Extremities: Full ROM and Muscle Strength 5/5 Arises from chair slowly using walker for support Narrow Based Gait   Skin:    General: Skin is warm and dry.  Neurological:     Mental Status: She is alert and oriented to person, place, and time.  Psychiatric:        Mood and Affect: Mood normal.        Behavior: Behavior normal.           Assessment & Plan:  1. Chronic Bilateral Leg pain: Paresthesia: Continue to Monitor.12/24/2019 2.Paresthesia /Lumbar Radiculitis:Crystal Lee due to daytime drowsiness. We will  continue to monitor.12/24/2019 3. Pain of Left Wrist/:No complaints today.:S/P Carpal Tunnel Release on 06/03/2017 by Dr. Ellene Route. Dr. Ellene Route Following.12/24/2019. 4. Fracture of superior pubic ramus.Dr. AluisioFollowing.Continue to monitor.12/24/2019. 5.BilateralKnee OA: Continue Voltaren Gel.Continue to monitor. Orthopedist following.12/24/2019. 6. Polyarthralgia: Continue to alternate with heat and ice Lee. Continue current medication regime. Continue to monitor.12/24/2019. 7. Chronic Pain Syndrome:Refilled::Hydrocodone10/325 mg one tablet every 6 hours as needed for moderate pain #120.12/24/2019. 8. Lumbar Compression Fracture L2 and L3: Crystal Lee.Continue with rest/ heat Lee. Continue to Monitor.12/24/2019. 9. Muscle Spasm: ContinueFlexeril 5 mg at HS. Continue to monitor.12/24/2019.  F/U in 1 Month  23minutes of face to face patient care time was spent during this visit. All questions were encouraged and answered.

## 2020-01-13 NOTE — Patient Instructions (Addendum)
It was great to see you again today! Please see me in about 6 months  We will try fenofibrate for your cholesterol I will set you up to see ENT about your taste change We will also arrange for doppler of your legs to check for blockage

## 2020-01-13 NOTE — Progress Notes (Signed)
[facility] [place], [place], [place] [address] [phone]  Date:  01/14/2020   Name:  Crystal Lee   DOB:  1933-06-14   MRN:  [id number]  PCP:  Darreld Mclean, MD    Chief Complaint: Medication Management (non statin medication)   History of Present Illness:  Crystal Lee is a 86 y.o. very pleasant female patient who presents with the following:  Patient here today to follow-up on medications Last seen by myself in February History of breast cancer status post bilateral mastectomy, osteoporosis, hypertension, hyperlipidemia, postlaminectomy syndrome  Covid series- done  Mammogram- s/p bilateral mastectomy  Colonoscopy up-to-date-due next year  She came off atorvastatin due to transaminiits on the suggestion of her oncologist, Dr. Marin Olp- Since stopping atorvastatin her liver function tests have normalized.  However, her lipids are no longer as favorable as previous Pt notes that she has poor sense of taste- started 2 years ago or so-she wonders why this is Her mouth feels very dry some of the time    Patient Active Problem List   Diagnosis Date Noted  . Constipation   . Encounter for orthopedic follow-up care 06/18/2019  . Urinary tract infection 04/20/2019  . Acute pain of right wrist 03/28/2019  . Tenosynovitis, wrist 03/28/2019  . Acquired trigger finger of right little finger 01/15/2019  . Carpal tunnel syndrome of right wrist 01/15/2019  . Aftercare 08/17/2018  . Lumbar post-laminectomy syndrome 07/31/2018  . Heartburn   . Fracture of superior pubic ramus ([place]) 06/16/2018  . Radial styloid tenosynovitis of left hand 05/16/2018  . Pain of left hand 04/20/2018  . Osteopenia 04/15/2018  . Dyslipidemia 02/02/2018  . Gastroesophageal reflux disease 02/02/2018  . Trochanteric bursitis of left hip 12/23/2017  . Primary osteoarthritis of left knee 10/17/2017  . History of right knee joint  replacement 10/17/2017  . Pain in both lower extremities 08/02/2017  . Stage 1 breast cancer, ER+, right ([place]) 07/04/2017  . 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 Pain in right hand 06/10/2017  . Trigger finger of right hand 06/10/2017  . Paresthesia 02/09/2017  . Low back pain 02/09/2017  . Gait abnormality 02/09/2017  . OA (osteoarthritis) of hip 05/09/2015    Past Medical History:  Diagnosis Date  . Arthritis   . Cancer (HCC)    HX BREAST CANCER/ SKIN CANCER  . Complication of anesthesia    N/V WITH MORPHINE  . Difficulty sleeping   . Fractured hip ([place])    LEFT - AUG 2016  . GERD (gastroesophageal reflux disease)   . Hyperlipidemia   . Hypertension   . Melanoma ([place])   . Neuropathy   . Nocturia   . Osteopenia   . 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 PONV (postoperative nausea and vomiting)    PT STATES MORPHINE CAUSED N/V  . Rosacea   . Sleep apnea   . Stage 1 breast cancer, ER+, right ([place]) 07/04/2017    Past Surgical History:  Procedure Laterality Date  . [place] STUDY N/A 06/21/2018   Procedure: [place] STUDY;  Surgeon: Lavena Bullion, DO;  [facility];  Service: Gastroenterology;  Laterality: N/A;  with impedance  . ABDOMINAL HYSTERECTOMY  2010  . ANAL RECTAL MANOMETRY N/A 09/19/2019   Procedure: ANO RECTAL MANOMETRY;  Surgeon: Mauri Pole, MD;  [facility];  Service: Endoscopy;  Laterality: N/A;  . APPENDECTOMY  1949  . [place]  . BREAST SURGERY    . CATARACT EXTRACTION Bilateral 2011  . CHOLECYSTECTOMY    . ESOPHAGEAL MANOMETRY N/A 06/21/2018   Procedure: ESOPHAGEAL MANOMETRY (EM);  Surgeon: Lavena Bullion, DO;  [facility];  Service: Gastroenterology;  Laterality: N/A;  . GALLBLADDER SURGERY  2015  . JOINT REPLACEMENT     RT TOTAL HIP / RT TOTAL KNEE  . MASTECTOMY  1998   BILATERAL   . [place] IMPEDANCE STUDY  06/21/2018   Procedure: [place] IMPEDANCE STUDY;  Surgeon:  Lavena Bullion, DO;  [facility];  Service: Gastroenterology;;  . SKIN CANCER EXCISION  2016   RT SIDE OF NOSE  . TONSILLECTOMY  1953  . TOTAL HIP ARTHROPLASTY  2010   RIGHT  . TOTAL HIP ARTHROPLASTY Left 05/09/2015   Procedure: LEFT TOTAL HIP ARTHROPLASTY ANTERIOR APPROACH;  Surgeon: Gaynelle Arabian, MD;  [facility];  Service: Orthopedics;  Laterality: Left;  . TOTAL KNEE ARTHROPLASTY  2001  . TUMOR REMOVAL  2012   ABDOMINAL - NON CANCEROUS    Social History   Tobacco Use  . Smoking status: Former Smoker    Quit date: 02/03/1976    Years since quitting: 43.9  . Smokeless tobacco: Never Used  Vaping Use  . Vaping Use: Never used  Substance Use Topics  . Alcohol use: No  . Drug use: No    Family History  Problem Relation Age of Onset  . Other Mother        complications from flu  . Other Father        unsure of cause  . Colon cancer Neg Hx     Allergies  Allergen Reactions  . Adhesive [Tape]   . Morphine And Related Nausea And Vomiting    Medication list has been reviewed and updated.  Current Outpatient Medications on File Prior to Visit  Medication Sig Dispense Refill  . CALCIUM PO Take 1,200 mg by mouth daily.    . cyclobenzaprine (FLEXERIL) 5 MG tablet TAKE ONE TABLET BY MOUTH AT BEDTIME AS NEEDED FOR MUSCLE SPASMS 270 tablet 0  . ergocalciferol (VITAMIN D2) 1.25 MG (50000 UT) capsule Take 1 capsule (50,000 Units total) by mouth once a week. 12 capsule 3  . fluorouracil (EFUDEX) 5 % cream fluorouracil 5 % topical cream    . HYDROcodone-acetaminophen (NORCO) 10-325 MG tablet Take 1 tablet by mouth every 6 (six) hours as needed. 120 tablet 0  . ketoconazole (NIZORAL) 2 % cream SMARTSIG:1 Application Topical 1 to 2 Times Daily    . losartan (COZAAR) 25 MG tablet Take 1 tablet (25 mg total) by mouth daily. 90 tablet 3  . metroNIDAZOLE (METROGEL) 1 % gel Apply topically daily. Use as needed for rosacea 60 g 2  . ondansetron (ZOFRAN-ODT) 4 MG  disintegrating tablet as needed.    Marland Kitchen Plecanatide (TRULANCE) 3 MG TABS Take 3 mg by mouth daily. 90 tablet 5  . potassium chloride (KLOR-CON) 10 MEQ tablet Take 1 tablet (10 mEq total) by mouth daily. 90 tablet 3  . furosemide (LASIX) 20 MG tablet Take 1 tablet (20 mg total) by mouth daily. 90 tablet 3   No current facility-administered medications on file prior to visit.    Review of Systems:  As per HPI- otherwise negative.   Physical Examination: Vitals:   01/14/20 1339  BP: 122/60  Pulse: 89  Resp: 17  SpO2: 98%   Vitals:   01/14/20 1339  Weight: 229 lb (103.9 kg)  Height: 5\' 6"  (1.676 m)   Body mass index is 36.96 kg/m. Ideal Body Weight: Weight in (lb) to have BMI = 25: 154.6  GEN: no acute distress.  Obese, looks well  HEENT: Atraumatic, Normocephalic. Bilateral TM wnl, oropharynx normal.  PEERL,EOMI.   Ears and Nose: No external deformity. CV: RRR, No M/G/R. No JVD. No thrill. No extra heart sounds. PULM: CTA B, no wheezes, crackles, rhonchi. No retractions. No resp. distress. No accessory muscle use. EXTR: No c/c/e PSYCH: Normally interactive. Conversant.    Assessment and Plan: Extremity atherosclerosis with intermittent claudication ([place]) - Plan: VAS [place] ABI WITH/WO TBI  Dyslipidemia - Plan: fenofibrate (TRICOR) 48 MG tablet  Loss of taste - Plan: Ambulatory referral to ENT  Discussed lipids with patient.  As she is now 86 years old, we discussed the fact that nonstatin antilipid therapy may not offer her significant survival or morbidity benefit.  However, she would really like to try treating her lipids.  We will have her try fenofibrate, we will see how she tolerates this She also has concern of lack of taste for about 2 years.  She otherwise feels well except for dry mouth-we will refer to ENT for opinion on her request Trachelle also wonders "when will do the Doppler to look for artery disease in my legs"-it sounds like this was done regularly when [place] was  [place]  She describes possible symptoms of claudication, but more leg pain when she is in bed at night.  We can certainly do ABI testing for her-I have ordered  Asked her to see me in about 6 months This visit occurred during the SARS-CoV-2 public health emergency.  Safety protocols were in place, including screening questions prior to the visit, additional usage of staff PPE, and extensive cleaning of exam room while observing appropriate contact time as indicated for disinfecting solutions.    Signed Lamar Blinks, MD

## 2020-01-14 ENCOUNTER — Encounter: Payer: Self-pay | Admitting: Family Medicine

## 2020-01-14 ENCOUNTER — Ambulatory Visit (INDEPENDENT_AMBULATORY_CARE_PROVIDER_SITE_OTHER): Payer: Medicare Other | Admitting: Family Medicine

## 2020-01-14 ENCOUNTER — Other Ambulatory Visit: Payer: Self-pay

## 2020-01-14 VITALS — BP 122/60 | HR 89 | Resp 17 | Ht 66.0 in | Wt 229.0 lb

## 2020-01-14 DIAGNOSIS — I70219 Atherosclerosis of native arteries of extremities with intermittent claudication, unspecified extremity: Secondary | ICD-10-CM | POA: Diagnosis not present

## 2020-01-14 DIAGNOSIS — E785 Hyperlipidemia, unspecified: Secondary | ICD-10-CM | POA: Diagnosis not present

## 2020-01-14 DIAGNOSIS — R432 Parageusia: Secondary | ICD-10-CM | POA: Diagnosis not present

## 2020-01-14 MED ORDER — FENOFIBRATE 48 MG PO TABS: 48.0000 mg | ORAL_TABLET | Freq: Every day | ORAL | 0 refills | Status: DC

## 2020-01-15 ENCOUNTER — Encounter: Payer: Self-pay | Admitting: Family Medicine

## 2020-01-15 ENCOUNTER — Ambulatory Visit (HOSPITAL_COMMUNITY)
Admission: RE | Admit: 2020-01-15 | Discharge: 2020-01-15 | Disposition: A | Discharge status: Home / Self Care | Payer: Medicare Other | Source: Ambulatory Visit | Attending: Family Medicine | Admitting: Family Medicine

## 2020-01-15 DIAGNOSIS — I70219 Atherosclerosis of native arteries of extremities with intermittent claudication, unspecified extremity: Secondary | ICD-10-CM

## 2020-01-25 ENCOUNTER — Telehealth: Payer: Self-pay | Admitting: Registered Nurse

## 2020-01-25 DIAGNOSIS — L603 Nail dystrophy: Secondary | ICD-10-CM | POA: Diagnosis not present

## 2020-01-25 DIAGNOSIS — M2022 Hallux rigidus, left foot: Secondary | ICD-10-CM | POA: Diagnosis not present

## 2020-01-25 MED ORDER — HYDROCODONE-ACETAMINOPHEN 10-325 MG PO TABS: 1.0000 | ORAL_TABLET | Freq: Four times a day (QID) | ORAL | 0 refills | Status: DC | PRN

## 2020-01-25 NOTE — Addendum Note (Signed)
Addended by: Bayard Hugger on: 01/25/2020 02:15 PM   Modules accepted: Orders

## 2020-01-25 NOTE — Telephone Encounter (Signed)
Ms Hoopes has been rescheduled, could you please call her and let her know when she can pick up her medication.  Thank you

## 2020-01-25 NOTE — Telephone Encounter (Signed)
PMP was Reviewed: Hydrocodone e-scribed. Ms. Launer is aware of the above.

## 2020-01-28 ENCOUNTER — Encounter: Payer: Medicare Other | Admitting: Registered Nurse

## 2020-01-28 ENCOUNTER — Other Ambulatory Visit: Payer: Self-pay

## 2020-01-28 DIAGNOSIS — E785 Hyperlipidemia, unspecified: Secondary | ICD-10-CM

## 2020-01-28 MED ORDER — FENOFIBRATE 48 MG PO TABS: 48.0000 mg | ORAL_TABLET | Freq: Every day | ORAL | 3 refills | Status: DC

## 2020-01-29 DIAGNOSIS — L570 Actinic keratosis: Secondary | ICD-10-CM | POA: Diagnosis not present

## 2020-01-29 DIAGNOSIS — L820 Inflamed seborrheic keratosis: Secondary | ICD-10-CM | POA: Diagnosis not present

## 2020-01-29 DIAGNOSIS — L720 Epidermal cyst: Secondary | ICD-10-CM | POA: Diagnosis not present

## 2020-01-31 ENCOUNTER — Ambulatory Visit: Payer: Medicare Other | Admitting: Registered Nurse

## 2020-02-05 DIAGNOSIS — N3020 Other chronic cystitis without hematuria: Secondary | ICD-10-CM | POA: Diagnosis not present

## 2020-02-06 DIAGNOSIS — H1033 Unspecified acute conjunctivitis, bilateral: Secondary | ICD-10-CM | POA: Diagnosis not present

## 2020-02-06 DIAGNOSIS — H52203 Unspecified astigmatism, bilateral: Secondary | ICD-10-CM | POA: Diagnosis not present

## 2020-02-06 DIAGNOSIS — H01002 Unspecified blepharitis right lower eyelid: Secondary | ICD-10-CM | POA: Diagnosis not present

## 2020-02-06 DIAGNOSIS — H01005 Unspecified blepharitis left lower eyelid: Secondary | ICD-10-CM | POA: Diagnosis not present

## 2020-02-08 ENCOUNTER — Other Ambulatory Visit: Payer: Self-pay

## 2020-02-08 ENCOUNTER — Encounter: Payer: Self-pay | Admitting: Registered Nurse

## 2020-02-08 ENCOUNTER — Encounter: Payer: Medicare Other | Attending: Physical Medicine & Rehabilitation | Admitting: Registered Nurse

## 2020-02-08 VITALS — Ht 66.0 in | Wt 229.0 lb

## 2020-02-08 DIAGNOSIS — I10 Essential (primary) hypertension: Secondary | ICD-10-CM | POA: Insufficient documentation

## 2020-02-08 DIAGNOSIS — R202 Paresthesia of skin: Secondary | ICD-10-CM | POA: Diagnosis not present

## 2020-02-08 DIAGNOSIS — M7918 Myalgia, other site: Secondary | ICD-10-CM | POA: Insufficient documentation

## 2020-02-08 DIAGNOSIS — Z9889 Other specified postprocedural states: Secondary | ICD-10-CM | POA: Insufficient documentation

## 2020-02-08 DIAGNOSIS — M4856XA Collapsed vertebra, not elsewhere classified, lumbar region, initial encounter for fracture: Secondary | ICD-10-CM | POA: Insufficient documentation

## 2020-02-08 DIAGNOSIS — M2550 Pain in unspecified joint: Secondary | ICD-10-CM | POA: Diagnosis not present

## 2020-02-08 DIAGNOSIS — E785 Hyperlipidemia, unspecified: Secondary | ICD-10-CM | POA: Insufficient documentation

## 2020-02-08 DIAGNOSIS — G894 Chronic pain syndrome: Secondary | ICD-10-CM

## 2020-02-08 DIAGNOSIS — Z853 Personal history of malignant neoplasm of breast: Secondary | ICD-10-CM | POA: Insufficient documentation

## 2020-02-08 DIAGNOSIS — M62838 Other muscle spasm: Secondary | ICD-10-CM

## 2020-02-08 DIAGNOSIS — M545 Low back pain: Secondary | ICD-10-CM | POA: Insufficient documentation

## 2020-02-08 DIAGNOSIS — I70219 Atherosclerosis of native arteries of extremities with intermittent claudication, unspecified extremity: Secondary | ICD-10-CM | POA: Diagnosis not present

## 2020-02-08 DIAGNOSIS — Z79899 Other long term (current) drug therapy: Secondary | ICD-10-CM | POA: Diagnosis not present

## 2020-02-08 DIAGNOSIS — M1712 Unilateral primary osteoarthritis, left knee: Secondary | ICD-10-CM | POA: Insufficient documentation

## 2020-02-08 DIAGNOSIS — S32000S Wedge compression fracture of unspecified lumbar vertebra, sequela: Secondary | ICD-10-CM | POA: Diagnosis not present

## 2020-02-08 DIAGNOSIS — Z5181 Encounter for therapeutic drug level monitoring: Secondary | ICD-10-CM

## 2020-02-08 DIAGNOSIS — Z85820 Personal history of malignant melanoma of skin: Secondary | ICD-10-CM | POA: Insufficient documentation

## 2020-02-08 DIAGNOSIS — Z9049 Acquired absence of other specified parts of digestive tract: Secondary | ICD-10-CM | POA: Insufficient documentation

## 2020-02-08 DIAGNOSIS — Z96651 Presence of right artificial knee joint: Secondary | ICD-10-CM | POA: Insufficient documentation

## 2020-02-08 DIAGNOSIS — Z8489 Family history of other specified conditions: Secondary | ICD-10-CM | POA: Insufficient documentation

## 2020-02-08 DIAGNOSIS — Z9013 Acquired absence of bilateral breasts and nipples: Secondary | ICD-10-CM | POA: Insufficient documentation

## 2020-02-08 DIAGNOSIS — Z87891 Personal history of nicotine dependence: Secondary | ICD-10-CM | POA: Insufficient documentation

## 2020-02-08 DIAGNOSIS — M170 Bilateral primary osteoarthritis of knee: Secondary | ICD-10-CM

## 2020-02-08 DIAGNOSIS — M79605 Pain in left leg: Secondary | ICD-10-CM | POA: Insufficient documentation

## 2020-02-08 DIAGNOSIS — M8580 Other specified disorders of bone density and structure, unspecified site: Secondary | ICD-10-CM | POA: Insufficient documentation

## 2020-02-08 DIAGNOSIS — M961 Postlaminectomy syndrome, not elsewhere classified: Secondary | ICD-10-CM

## 2020-02-08 DIAGNOSIS — K219 Gastro-esophageal reflux disease without esophagitis: Secondary | ICD-10-CM | POA: Insufficient documentation

## 2020-02-08 DIAGNOSIS — M79604 Pain in right leg: Secondary | ICD-10-CM | POA: Insufficient documentation

## 2020-02-08 DIAGNOSIS — Z8673 Personal history of transient ischemic attack (TIA), and cerebral infarction without residual deficits: Secondary | ICD-10-CM | POA: Insufficient documentation

## 2020-02-08 DIAGNOSIS — G4730 Sleep apnea, unspecified: Secondary | ICD-10-CM | POA: Insufficient documentation

## 2020-02-08 DIAGNOSIS — M5416 Radiculopathy, lumbar region: Secondary | ICD-10-CM | POA: Insufficient documentation

## 2020-02-08 DIAGNOSIS — Z760 Encounter for issue of repeat prescription: Secondary | ICD-10-CM | POA: Insufficient documentation

## 2020-02-08 DIAGNOSIS — Z96643 Presence of artificial hip joint, bilateral: Secondary | ICD-10-CM | POA: Insufficient documentation

## 2020-02-08 MED ORDER — HYDROCODONE-ACETAMINOPHEN 10-325 MG PO TABS: 1.0000 | ORAL_TABLET | Freq: Four times a day (QID) | ORAL | 0 refills | Status: DC | PRN

## 2020-02-08 NOTE — Progress Notes (Signed)
Subjective:    Patient ID: Crystal Lee, female    DOB: 1933/06/04, 86 y.o.   MRN: [id number]  HPI: Crystal Lee is a 86 y.o. female whose appointment was changed to a virtual office visit to reduce the risk of exposure to the COVID-19 virus and to help Crystal Lee remain healthy and safe. The tele-health  visit will also provide continuity of care. Crystal Lee agrees with tele-health and verbalizes understanding.  She states her pain is located in her mid- lower back, bilateral lower extremities with tingling and bilateral knee pain. Also reports generalized joint pain. She  rates her pain 5. Her current exercise regime is walking and performing stretching exercises.  [name] Morphine equivalent is 40.00 MME.    Her last Oral Swab was Performed on 11/22/2019, it was consistent.    Pain Inventory Average Pain 5 Pain Right Now 5 My pain is constant, sharp, burning, dull, stabbing, tingling and aching  In the last 24 hours, has pain interfered with the following? General activity 5 Relation with others 5 Enjoyment of life 5 What TIME of day is your pain at its worst? daytime, evening and night Sleep (in general) Fair  Pain is worse with: walking, bending, sitting, standing and some activites Pain improves with: rest and medication Relief from Meds: 6  Family History  Problem Relation Age of Onset  . Other Mother        complications from flu  . Other Father        unsure of cause  . Colon cancer Neg Hx    Social History   Socioeconomic History  . Marital status: Widowed    Spouse name: Not on file  . Number of children: 3  . Years of education: 16 years  . Highest education level: Not on file  Occupational History  . Occupation: Retired  Tobacco Use  . Smoking status: Former Smoker    Quit date: 02/03/1976    Years since quitting: 44.0  . Smokeless tobacco: Never Used  Vaping Use  . Vaping Use: Never used  Substance and Sexual Activity  . Alcohol use: No  .  Drug use: No  . Sexual activity: Yes    Birth control/protection: Post-menopausal  Other Topics Concern  . Not on file  Social History Narrative   Lives at home with husband.   Right-handed.   No caffeine use.   Social Determinants of Health   Financial Resource Strain:   . Difficulty of Paying Living Expenses: Not on file  Food Insecurity:   . Worried About Charity fundraiser in the Last Year: Not on file  . Ran Out of Food in the Last Year: Not on file  Transportation Needs:   . Lack of Transportation (Medical): Not on file  . Lack of Transportation (Non-Medical): Not on file  Physical Activity:   . Days of Exercise per Week: Not on file  . Minutes of Exercise per Session: Not on file  Stress:   . Feeling of Stress : Not on file  Social Connections:   . Frequency of Communication with Friends and Family: Not on file  . Frequency of Social Gatherings with Friends and Family: Not on file  . Attends Religious Services: Not on file  . Active Member of Clubs or Organizations: Not on file  . Attends Archivist Meetings: Not on file  . Marital Status: Not on file   Past Surgical History:  Procedure Laterality Date  . 24  HOUR [place] STUDY N/A 06/21/2018   Procedure: 24 HOUR PH STUDY;  Surgeon: Lavena Bullion, DO;  [facility]: [facility];  Service: Gastroenterology;  Laterality: N/A;  with impedance  . ABDOMINAL HYSTERECTOMY  2010  . ANAL RECTAL MANOMETRY N/A 09/19/2019   Procedure: ANO RECTAL MANOMETRY;  Surgeon: Mauri Pole, MD;  [facility]: [facility];  Service: Endoscopy;  Laterality: N/A;  . APPENDECTOMY  1949  . [place]  . BREAST SURGERY    . CATARACT EXTRACTION Bilateral 2011  . CHOLECYSTECTOMY    . ESOPHAGEAL MANOMETRY N/A 06/21/2018   Procedure: ESOPHAGEAL MANOMETRY (EM);  Surgeon: Lavena Bullion, DO;  [facility];  Service: Gastroenterology;  Laterality: N/A;  . GALLBLADDER SURGERY  2015  . JOINT REPLACEMENT     RT TOTAL  HIP / RT TOTAL KNEE  . MASTECTOMY  1998   BILATERAL   . [place] IMPEDANCE STUDY  06/21/2018   Procedure: [place] IMPEDANCE STUDY;  Surgeon: Lavena Bullion, DO;  [facility];  Service: Gastroenterology;;  . SKIN CANCER EXCISION  2016   RT SIDE OF NOSE  . TONSILLECTOMY  1953  . TOTAL HIP ARTHROPLASTY  2010   RIGHT  . TOTAL HIP ARTHROPLASTY Left 05/09/2015   Procedure: LEFT TOTAL HIP ARTHROPLASTY ANTERIOR APPROACH;  Surgeon: Gaynelle Arabian, MD;  [facility];  Service: Orthopedics;  Laterality: Left;  . TOTAL KNEE ARTHROPLASTY  2001  . TUMOR REMOVAL  2012   ABDOMINAL - NON CANCEROUS   Past Surgical History:  Procedure Laterality Date  . [place] STUDY N/A 06/21/2018   Procedure: [place] STUDY;  Surgeon: Lavena Bullion, DO;  [facility];  Service: Gastroenterology;  Laterality: N/A;  with impedance  . ABDOMINAL HYSTERECTOMY  2010  . ANAL RECTAL MANOMETRY N/A 09/19/2019   Procedure: ANO RECTAL MANOMETRY;  Surgeon: Mauri Pole, MD;  [facility];  Service: Endoscopy;  Laterality: N/A;  . APPENDECTOMY  1949  . [place]  . BREAST SURGERY    . CATARACT EXTRACTION Bilateral 2011  . CHOLECYSTECTOMY    . ESOPHAGEAL MANOMETRY N/A 06/21/2018   Procedure: ESOPHAGEAL MANOMETRY (EM);  Surgeon: Lavena Bullion, DO;  [facility];  Service: Gastroenterology;  Laterality: N/A;  . GALLBLADDER SURGERY  2015  . JOINT REPLACEMENT     RT TOTAL HIP / RT TOTAL KNEE  . MASTECTOMY  1998   BILATERAL   . [place] IMPEDANCE STUDY  06/21/2018   Procedure: [place] IMPEDANCE STUDY;  Surgeon: Lavena Bullion, DO;  [facility];  Service: Gastroenterology;;  . SKIN CANCER EXCISION  2016   RT SIDE OF NOSE  . TONSILLECTOMY  1953  . TOTAL HIP ARTHROPLASTY  2010   RIGHT  . TOTAL HIP ARTHROPLASTY Left 05/09/2015   Procedure: LEFT TOTAL HIP ARTHROPLASTY ANTERIOR APPROACH;  Surgeon: Gaynelle Arabian, MD;  [facility];  Service: Orthopedics;   Laterality: Left;  . TOTAL KNEE ARTHROPLASTY  2001  . TUMOR REMOVAL  2012   ABDOMINAL - NON CANCEROUS   Past Medical History:  Diagnosis Date  . Arthritis   . Cancer (HCC)    HX BREAST CANCER/ SKIN CANCER  . Complication of anesthesia    N/V WITH MORPHINE  . Difficulty sleeping   . Fractured hip ([place])    LEFT - AUG 2016  . GERD (gastroesophageal reflux disease)   . Hyperlipidemia   . Hypertension   . Melanoma ([place])   .  Neuropathy   . Nocturia   . Osteopenia   . 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 PONV (postoperative nausea and vomiting)    PT STATES MORPHINE CAUSED N/V  . Rosacea   . Sleep apnea   . Stage 1 breast cancer, ER+, right ([place]) 07/04/2017   There were no vitals taken for this visit.  Opioid Risk Score:   Fall Risk Score:  `1  Depression screen PHQ 2/9  Depression screen [facility] 2/9 12/24/2019 11/22/2018 09/28/2018 06/02/2018 04/10/2018 02/20/2018 10/17/2017  Decreased Interest 0 0 0 0 0 0 0  Down, Depressed, Hopeless 0 0 0 0 0 0 0  PHQ - 2 Score 0 0 0 0 0 0 0  Altered sleeping - - - - - - 3  Tired, decreased energy - - - - - - 3  Change in appetite - - - - - - 0  Feeling bad or failure about yourself  - - - - - - 0  Trouble concentrating - - - - - - 0  Moving slowly or fidgety/restless - - - - - - 0  Suicidal thoughts - - - - - - 0  PHQ-9 Score - - - - - - 6  Difficult doing work/chores - - - - - - Somewhat difficult    Review of Systems  Musculoskeletal: Positive for back pain.       Leg pain  All other systems reviewed and are negative.      Objective:   Physical Exam Vitals and nursing note reviewed.  Musculoskeletal:     Comments: No Physical Exam Performed: Tele-health visit           Assessment & Plan:  1. Chronic Bilateral Leg pain: Paresthesia: Continue to Monitor.02/08/2020 2.Paresthesia /Lumbar Radiculitis:Ms. Harrellhasweaned herself off the Lyrica due to daytime drowsiness. We will continue to  monitor.02/08/2020 3. Pain of Left Wrist/:No complaints today.:S/P Carpal Tunnel Release on 06/03/2017 by Dr. Ellene Route. Dr. Ellene Route Following.02/08/2020. 4. Fracture of superior pubic ramus.Dr. AluisioFollowing.Continue to monitor.02/08/2020. 5.BilateralKnee OA: Continue Voltaren Gel.Continue to monitor. Orthopedist following.02/08/2020. 6. Polyarthralgia: Continue to alternate with heat and ice therapy. Continue current medication regime. Continue to monitor.02/08/2020. 7. Chronic Pain Syndrome:Refilled::Hydrocodone10/325 mg one tablet every 6 hours as needed for moderate pain #120.02/08/2020. 8. Lumbar Compression Fracture L2 and L3: Ms. Renier refused physical therapy.Continue with rest/ heat therapy. Continue to Monitor.02/08/2020. 9. Muscle Spasm: ContinueFlexeril 5 mg at HS. Continue to monitor.02/08/2020.  F/U in 1 Month  Tele-Health Visit Telephone call Established Patient Location of Patient in Her Home Location of Provider in the Office Total Time Spent: 10 Minutes

## 2020-02-12 ENCOUNTER — Ambulatory Visit: Payer: Medicare Other | Admitting: Cardiology

## 2020-02-14 ENCOUNTER — Other Ambulatory Visit: Payer: Self-pay

## 2020-02-14 ENCOUNTER — Ambulatory Visit (INDEPENDENT_AMBULATORY_CARE_PROVIDER_SITE_OTHER): Payer: Medicare Other | Admitting: Otolaryngology

## 2020-02-14 ENCOUNTER — Encounter (INDEPENDENT_AMBULATORY_CARE_PROVIDER_SITE_OTHER): Payer: Self-pay | Admitting: Otolaryngology

## 2020-02-14 VITALS — Temp 93.4°F

## 2020-02-14 DIAGNOSIS — I70219 Atherosclerosis of native arteries of extremities with intermittent claudication, unspecified extremity: Secondary | ICD-10-CM | POA: Diagnosis not present

## 2020-02-14 DIAGNOSIS — R432 Parageusia: Secondary | ICD-10-CM | POA: Diagnosis not present

## 2020-02-14 NOTE — Progress Notes (Signed)
HPI: Crystal Lee is a 86 y.o. female who presents is referred by Dr. Lorelei Pont for evaluation of loss of taste that she initially noticed about 2 and half years ago.  On discussion with the patient she reports a sudden loss of sense of taste that she noticed about 2 and half years ago while she was under some stress with her husband being sick.  This apparently occurred rather suddenly with no other symptoms..  Weight has been stable over the past 2 years with no difficulty eating or swallowing.  She describes a salty taste associated with eating.  Past Medical History:  Diagnosis Date  . Arthritis   . Cancer (HCC)    HX BREAST CANCER/ SKIN CANCER  . Complication of anesthesia    N/V WITH MORPHINE  . Difficulty sleeping   . Fractured hip ([place])    LEFT - AUG 2016  . GERD (gastroesophageal reflux disease)   . Hyperlipidemia   . Hypertension   . Melanoma ([place])   . Neuropathy   . Nocturia   . Osteopenia   . 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 PONV (postoperative nausea and vomiting)    PT STATES MORPHINE CAUSED N/V  . Rosacea   . Sleep apnea   . Stage 1 breast cancer, ER+, right ([place]) 07/04/2017   Past Surgical History:  Procedure Laterality Date  . [place] STUDY N/A 06/21/2018   Procedure: [place] STUDY;  Surgeon: Lavena Bullion, DO;  [facility];  Service: Gastroenterology;  Laterality: N/A;  with impedance  . ABDOMINAL HYSTERECTOMY  2010  . ANAL RECTAL MANOMETRY N/A 09/19/2019   Procedure: ANO RECTAL MANOMETRY;  Surgeon: Mauri Pole, MD;  [facility];  Service: Endoscopy;  Laterality: N/A;  . APPENDECTOMY  1949  . [place]  . BREAST SURGERY    . CATARACT EXTRACTION Bilateral 2011  . CHOLECYSTECTOMY    . ESOPHAGEAL MANOMETRY N/A 06/21/2018   Procedure: ESOPHAGEAL MANOMETRY (EM);  Surgeon: Lavena Bullion, DO;  [facility];  Service: Gastroenterology;  Laterality: N/A;  . GALLBLADDER SURGERY  2015  .  JOINT REPLACEMENT     RT TOTAL HIP / RT TOTAL KNEE  . MASTECTOMY  1998   BILATERAL   . [place] IMPEDANCE STUDY  06/21/2018   Procedure: [place] IMPEDANCE STUDY;  Surgeon: Lavena Bullion, DO;  [facility];  Service: Gastroenterology;;  . SKIN CANCER EXCISION  2016   RT SIDE OF NOSE  . TONSILLECTOMY  1953  . TOTAL HIP ARTHROPLASTY  2010   RIGHT  . TOTAL HIP ARTHROPLASTY Left 05/09/2015   Procedure: LEFT TOTAL HIP ARTHROPLASTY ANTERIOR APPROACH;  Surgeon: Gaynelle Arabian, MD;  [facility];  Service: Orthopedics;  Laterality: Left;  . TOTAL KNEE ARTHROPLASTY  2001  . TUMOR REMOVAL  2012   ABDOMINAL - NON CANCEROUS   Social History   Socioeconomic History  . Marital status: Widowed    Spouse name: Not on file  . Number of children: 3  . Years of education: 16 years  . Highest education level: Not on file  Occupational History  . Occupation: Retired  Tobacco Use  . Smoking status: Former Smoker    Packs/day: 0.50    Years: 20.00    Pack years: 10.00    Quit date: 02/03/1976    Years since quitting: 44.0  . Smokeless tobacco: Never Used  Vaping Use  . Vaping Use: Never used  Substance and  Sexual Activity  . Alcohol use: No  . Drug use: No  . Sexual activity: Yes    Birth control/protection: Post-menopausal  Other Topics Concern  . Not on file  Social History Narrative   Lives at home with husband.   Right-handed.   No caffeine use.   Social Determinants of Health   Financial Resource Strain:   . Difficulty of Paying Living Expenses: Not on file  Food Insecurity:   . Worried About Charity fundraiser in the Last Year: Not on file  . Ran Out of Food in the Last Year: Not on file  Transportation Needs:   . Lack of Transportation (Medical): Not on file  . Lack of Transportation (Non-Medical): Not on file  Physical Activity:   . Days of Exercise per Week: Not on file  . Minutes of Exercise per Session: Not on file  Stress:   . Feeling of Stress : Not on file   Social Connections:   . Frequency of Communication with Friends and Family: Not on file  . Frequency of Social Gatherings with Friends and Family: Not on file  . Attends Religious Services: Not on file  . Active Member of Clubs or Organizations: Not on file  . Attends Archivist Meetings: Not on file  . Marital Status: Not on file   Family History  Problem Relation Age of Onset  . Other Mother        complications from flu  . Other Father        unsure of cause  . Colon cancer Neg Hx    Allergies  Allergen Reactions  . Adhesive [Tape]   . Morphine And Related Nausea And Vomiting   Prior to Admission medications   Medication Sig Start Date End Date Taking? Authorizing Provider  CALCIUM PO Take 1,200 mg by mouth daily.   Yes [provider]  cyclobenzaprine (FLEXERIL) 5 MG tablet TAKE ONE TABLET BY MOUTH AT BEDTIME AS NEEDED FOR MUSCLE SPASMS 09/18/19  Yes Bayard Hugger, NP  ergocalciferol (VITAMIN D2) 1.25 MG (50000 UT) capsule Take 1 capsule (50,000 Units total) by mouth once a week. 12/05/19  Yes [name], MD  fenofibrate (TRICOR) 48 MG tablet Take 1 tablet (48 mg total) by mouth daily. 01/28/20  Yes Copland, Gay Filler, MD  fluorouracil (EFUDEX) 5 % cream fluorouracil 5 % topical cream   Yes [provider]  HYDROcodone-acetaminophen (NORCO) 10-325 MG tablet Take 1 tablet by mouth every 6 (six) hours as needed. 02/08/20  Yes Bayard Hugger, NP  ketoconazole (NIZORAL) 2 % cream SMARTSIG:1 Application Topical 1 to 2 Times Daily 12/06/19  Yes [provider]  losartan (COZAAR) 25 MG tablet Take 1 tablet (25 mg total) by mouth daily. 07/11/19  Yes Copland, Gay Filler, MD  metroNIDAZOLE (METROGEL) 1 % gel Apply topically daily. Use as needed for rosacea 07/11/19  Yes Copland, Gay Filler, MD  ondansetron (ZOFRAN-ODT) 4 MG disintegrating tablet as needed. 01/01/17  Yes [provider]  Plecanatide (TRULANCE) 3 MG TABS Take 3 mg by mouth  daily. 11/24/18  Yes Cirigliano, Vito V, DO  potassium chloride (KLOR-CON) 10 MEQ tablet Take 1 tablet (10 mEq total) by mouth daily. 12/11/19 03/10/20 Yes Richardo Priest, MD  furosemide (LASIX) 20 MG tablet Take 1 tablet (20 mg total) by mouth daily. 07/13/19 10/11/19  Richardo Priest, MD     Positive ROS: Otherwise negative  All other systems have been reviewed and were  otherwise negative with the exception of those mentioned in the HPI and as above.  Physical Exam: Constitutional: Alert, well-appearing, no acute distress. Ears: External ears without lesions or tenderness. Ear canals are clear bilaterally with intact, clear TMs  Bilaterally. Nasal: External nose without lesions. Septum midline with mild rhinitis.  Both middle meatus regions are clear.. Clear nasal passages Oral: Lips and gums without lesions. Tongue and palate mucosa without lesions. Posterior oropharynx clear.  Oral mucosa is normal throughout.  Tongue is soft to palpation with no oral lesions noted and no tongue lesions noted.  Indirect laryngoscopy revealed a clear hypopharynx and larynx. Neck: No palpable adenopathy or masses Respiratory: Breathing comfortably  Skin: No facial/neck lesions or rash noted.  Procedures  Assessment: Dysgeusia  Plan: There is no specific treatment for this and discussed this with the patient.   Radene Journey, MD   CC:

## 2020-02-24 NOTE — Progress Notes (Signed)
Cardiology Office Note:    Date:  02/25/2020   ID:  Crystal Lee, DOB 1933/10/25, MRN [id number]  PCP:  Crystal Mclean, MD  Cardiologist:  Shirlee More, MD    Referring MD: Crystal Mclean, MD    ASSESSMENT:    1. Hypertensive heart disease with chronic diastolic congestive heart failure ([place])   2. Dyslipidemia   3. Malaise and fatigue    PLAN:    In order of problems listed above:  1. Improved continue her loop diuretic recheck renal function potassium and antihypertensive losartan. 2. Stable continue her nonstatin therapy, lipid profile 12/05/2019 was at target with triglyceride 184 HDL 36 cholesterol 175 LDL 102 3. Anemic thyroid normal I suspect this is a consequence of her chronic narcotic therapy   Next appointment: 6 months   Medication Adjustments/Labs and Tests Ordered: Current medicines are reviewed at length with the patient today.  Concerns regarding medicines are outlined above.  Orders Placed This Encounter  Procedures  . Pro b natriuretic peptide (BNP)  . Basic metabolic panel   No orders of the defined types were placed in this encounter.   Chief Complaint  Patient presents with  . Follow-up  . Congestive Heart Failure    History of Present Illness:    Nancye Lee is a 86 y.o. female with a hx of hypertension hyperlipidemia and a history of breast cancer.  Last seen 08/10/2019.  Her echocardiogram performed 07/24/2019 shows ejection fraction 60 to 65% with normal diastolic filling pattern.  She had no significant valvular abnormality present.  Performed 04/30/2019 showed aortic atherosclerosis and normal cardiac appearance there is letter atelectasis with pulmonary parents whether with otherwise normal.  She had shortness of breath was found to have hypertensive heart disease with heart failure and was continued on a diuretic.    Compliance with diet, lifestyle and medications: Yes  Unfortunately she is socially isolated and the distance from  family lives alone.  Her predominant complaint is she feels tired and she takes her narcotic 2-3 times a day nighttime 4 times daily for chronic no chest pain shortness of breath palpitation or syncope but still has intermittent edema despite her diuretic. Past Medical History:  Diagnosis Date  . Arthritis   . Cancer (HCC)    HX BREAST CANCER/ SKIN CANCER  . Complication of anesthesia    N/V WITH MORPHINE  . Difficulty sleeping   . Fractured hip ([place])    LEFT - AUG 2016  . GERD (gastroesophageal reflux disease)   . Hyperlipidemia   . Hypertension   . Melanoma ([place])   . Neuropathy   . Nocturia   . Osteopenia   . 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 PONV (postoperative nausea and vomiting)    PT STATES MORPHINE CAUSED N/V  . Rosacea   . Sleep apnea   . Stage 1 breast cancer, ER+, right ([place]) 07/04/2017    Past Surgical History:  Procedure Laterality Date  . [place] STUDY N/A 06/21/2018   Procedure: [place] STUDY;  Surgeon: Lavena Bullion, DO;  [facility];  Service: Gastroenterology;  Laterality: N/A;  with impedance  . ABDOMINAL HYSTERECTOMY  2010  . ANAL RECTAL MANOMETRY N/A 09/19/2019   Procedure: ANO RECTAL MANOMETRY;  Surgeon: Mauri Pole, MD;  [facility];  Service: Endoscopy;  Laterality: N/A;  . APPENDECTOMY  1949  . [place]  . BREAST SURGERY    . CATARACT EXTRACTION Bilateral 2011  .  CHOLECYSTECTOMY    . ESOPHAGEAL MANOMETRY N/A 06/21/2018   Procedure: ESOPHAGEAL MANOMETRY (EM);  Surgeon: Lavena Bullion, DO;  [facility];  Service: Gastroenterology;  Laterality: N/A;  . GALLBLADDER SURGERY  2015  . JOINT REPLACEMENT     RT TOTAL HIP / RT TOTAL KNEE  . MASTECTOMY  1998   BILATERAL   . [place] IMPEDANCE STUDY  06/21/2018   Procedure: [place] IMPEDANCE STUDY;  Surgeon: Lavena Bullion, DO;  [facility];  Service: Gastroenterology;;  . SKIN CANCER EXCISION  2016   RT SIDE OF NOSE  .  TONSILLECTOMY  1953  . TOTAL HIP ARTHROPLASTY  2010   RIGHT  . TOTAL HIP ARTHROPLASTY Left 05/09/2015   Procedure: LEFT TOTAL HIP ARTHROPLASTY ANTERIOR APPROACH;  Surgeon: Gaynelle Arabian, MD;  [facility];  Service: Orthopedics;  Laterality: Left;  . TOTAL KNEE ARTHROPLASTY  2001  . TUMOR REMOVAL  2012   ABDOMINAL - NON CANCEROUS    Current Medications: Current Meds  Medication Sig  . CALCIUM PO Take 1,200 mg by mouth daily.  . cyclobenzaprine (FLEXERIL) 5 MG tablet TAKE ONE TABLET BY MOUTH AT BEDTIME AS NEEDED FOR MUSCLE SPASMS  . doxycycline (VIBRAMYCIN) 100 MG capsule Take 100 mg by mouth in the morning and at bedtime.  . ergocalciferol (VITAMIN D2) 1.25 MG (50000 UT) capsule Take 1 capsule (50,000 Units total) by mouth once a week.  . fenofibrate (TRICOR) 48 MG tablet Take 1 tablet (48 mg total) by mouth daily.  . fluorouracil (EFUDEX) 5 % cream fluorouracil 5 % topical cream  . furosemide (LASIX) 20 MG tablet Take 1 tablet (20 mg total) by mouth daily.  Marland Kitchen HYDROcodone-acetaminophen (NORCO) 10-325 MG tablet Take 1 tablet by mouth every 6 (six) hours as needed.  Marland Kitchen ketoconazole (NIZORAL) 2 % cream SMARTSIG:1 Application Topical 1 to 2 Times Daily  . losartan (COZAAR) 25 MG tablet Take 1 tablet (25 mg total) by mouth daily.  . metroNIDAZOLE (METROGEL) 1 % gel Apply topically daily. Use as needed for rosacea  . ondansetron (ZOFRAN-ODT) 4 MG disintegrating tablet as needed.  . potassium chloride (KLOR-CON) 10 MEQ tablet Take 1 tablet (10 mEq total) by mouth daily.     Allergies:   Adhesive [tape] and Morphine and related   Social History   Socioeconomic History  . Marital status: Widowed    Spouse name: Not on file  . Number of children: 3  . Years of education: 16 years  . Highest education level: Not on file  Occupational History  . Occupation: Retired  Tobacco Use  . Smoking status: Former Smoker    Packs/day: 0.50    Years: 20.00    Pack years: 10.00    Quit date:  02/03/1976    Years since quitting: 44.0  . Smokeless tobacco: Never Used  Vaping Use  . Vaping Use: Never used  Substance and Sexual Activity  . Alcohol use: No  . Drug use: No  . Sexual activity: Yes    Birth control/protection: Post-menopausal  Other Topics Concern  . Not on file  Social History Narrative   Lives at home with husband.   Right-handed.   No caffeine use.   Social Determinants of Health   Financial Resource Strain:   . Difficulty of Paying Living Expenses: Not on file  Food Insecurity:   . Worried About Charity fundraiser in the Last Year: Not on file  . Ran Out of Food in the Last Year:  Not on file  Transportation Needs:   . Lack of Transportation (Medical): Not on file  . Lack of Transportation (Non-Medical): Not on file  Physical Activity:   . Days of Exercise per Week: Not on file  . Minutes of Exercise per Session: Not on file  Stress:   . Feeling of Stress : Not on file  Social Connections:   . Frequency of Communication with Friends and Family: Not on file  . Frequency of Social Gatherings with Friends and Family: Not on file  . Attends Religious Services: Not on file  . Active Member of Clubs or Organizations: Not on file  . Attends Archivist Meetings: Not on file  . Marital Status: Not on file     Family History: The patient's family history includes Other in her father and mother. There is no history of Colon cancer. ROS:   Please see the history of present illness.    All other systems reviewed and are negative.  EKGs/Labs/Other Studies Reviewed:    The following studies were reviewed today:  Recent Labs: 07/13/2019: NT-Pro BNP 74 08/10/2019: TSH 2.540 12/05/2019: ALT 28; BUN 15; Creatinine 0.74; Hemoglobin 13.4; Platelet Count 115; Potassium 5.3; Sodium 139  Recent Lipid Panel    Component Value Date/Time   CHOL 175 12/05/2019 1355   TRIG 184 (H) 12/05/2019 1355   HDL 36 (L) 12/05/2019 1355   CHOLHDL 4.9 12/05/2019 1355     VLDL 37 12/05/2019 1355   LDLCALC 102 (H) 12/05/2019 1355    Physical Exam:    VS:  BP (!) 144/68   Pulse 84   Ht 5\' 6"  (1.676 m)   Wt 227 lb 6.4 oz (103.1 kg)   SpO2 96%   BMI 36.70 kg/m     Wt Readings from Last 3 Encounters:  02/25/20 227 lb 6.4 oz (103.1 kg)  02/08/20 229 lb (103.9 kg)  01/14/20 229 lb (103.9 kg)     GEN:  Well nourished, well developed in no acute distress HEENT: Normal NECK: No JVD; No carotid bruits LYMPHATICS: No lymphadenopathy CARDIAC: RRR, no murmurs, rubs, gallops RESPIRATORY:  Clear to auscultation without rales, wheezing or rhonchi  ABDOMEN: Soft, non-tender, non-distended MUSCULOSKELETAL: Improved but still has 1+ bilateral at the ankle pitting bilateral edema; No deformity  SKIN: Warm and dry NEUROLOGIC:  Alert and oriented x 3 PSYCHIATRIC:  Normal affect    Signed, Shirlee More, MD  02/25/2020 4:24 PM    [facility]

## 2020-02-25 ENCOUNTER — Encounter: Payer: Self-pay | Admitting: Cardiology

## 2020-02-25 ENCOUNTER — Other Ambulatory Visit: Payer: Self-pay

## 2020-02-25 ENCOUNTER — Ambulatory Visit (INDEPENDENT_AMBULATORY_CARE_PROVIDER_SITE_OTHER): Payer: Medicare Other | Admitting: Cardiology

## 2020-02-25 VITALS — BP 144/68 | HR 84 | Ht 66.0 in | Wt 227.4 lb

## 2020-02-25 DIAGNOSIS — I10 Essential (primary) hypertension: Secondary | ICD-10-CM | POA: Diagnosis not present

## 2020-02-25 DIAGNOSIS — R5383 Other fatigue: Secondary | ICD-10-CM | POA: Diagnosis not present

## 2020-02-25 DIAGNOSIS — E785 Hyperlipidemia, unspecified: Secondary | ICD-10-CM | POA: Diagnosis not present

## 2020-02-25 DIAGNOSIS — R5381 Other malaise: Secondary | ICD-10-CM | POA: Diagnosis not present

## 2020-02-25 DIAGNOSIS — I5032 Chronic diastolic (congestive) heart failure: Secondary | ICD-10-CM | POA: Diagnosis not present

## 2020-02-25 DIAGNOSIS — I110 Hypertensive heart disease with heart failure: Secondary | ICD-10-CM | POA: Diagnosis not present

## 2020-02-25 NOTE — Patient Instructions (Signed)
Medication Instructions:  Your physician recommends that you continue on your current medications as directed. Please refer to the Current Medication list given to you today.  *If you need a refill on your cardiac medications before your next appointment, please call your pharmacy*   Lab Work: Your physician recommends that you return for lab work in: TODAY BMP, ProBNP If you have labs (blood work) drawn today and your tests are completely normal, you will receive your results only by: . MyChart Message (if you have MyChart) OR . A paper copy in the mail If you have any lab test that is abnormal or we need to change your treatment, we will call you to review the results.   Testing/Procedures: None   Follow-Up: At [facility], you and your health needs are our priority.  As part of our continuing mission to provide you with exceptional heart care, we have created designated Provider Care Teams.  These Care Teams include your primary Cardiologist (physician) and Advanced Practice Providers (APPs -  Physician Assistants and Nurse Practitioners) who all work together to provide you with the care you need, when you need it.  We recommend signing up for the patient portal called "MyChart".  Sign up information is provided on this After Visit Summary.  MyChart is used to connect with patients for Virtual Visits (Telemedicine).  Patients are able to view lab/test results, encounter notes, upcoming appointments, etc.  Non-urgent messages can be sent to your provider as well.   To learn more about what you can do with MyChart, go to [email or web address].    Your next appointment:   6 month(s)  The format for your next appointment:   In Person  Provider:   Brian Munley, MD   Other Instructions   

## 2020-02-26 LAB — BASIC METABOLIC PANEL
BUN/Creatinine Ratio: 16 (ref 12–28)
BUN: 11 mg/dL (ref 8–27)
CO2: 23 mmol/L (ref 20–29)
Calcium: 10.1 mg/dL (ref 8.7–10.3)
Chloride: 102 mmol/L (ref 96–106)
Creatinine, Ser: 0.68 mg/dL (ref 0.57–1.00)
GFR calc Af Amer: 93 mL/min/{1.73_m2} (ref >59)
GFR calc non Af Amer: 81 mL/min/{1.73_m2} (ref >59)
Glucose: 104 mg/dL — ABNORMAL HIGH (ref 65–99)
Potassium: 4.5 mmol/L (ref 3.5–5.2)
Sodium: 139 mmol/L (ref 134–144)

## 2020-02-26 LAB — PRO B NATRIURETIC PEPTIDE: NT-Pro BNP: 90 pg/mL (ref 0–738)

## 2020-03-20 ENCOUNTER — Encounter: Payer: Self-pay | Admitting: Registered Nurse

## 2020-03-20 ENCOUNTER — Other Ambulatory Visit: Payer: Self-pay

## 2020-03-20 ENCOUNTER — Encounter: Payer: Medicare Other | Attending: Physical Medicine & Rehabilitation | Admitting: Registered Nurse

## 2020-03-20 VITALS — BP 115/73 | HR 80 | Temp 98.3°F | Ht 66.0 in | Wt 226.4 lb

## 2020-03-20 DIAGNOSIS — Z79899 Other long term (current) drug therapy: Secondary | ICD-10-CM | POA: Diagnosis not present

## 2020-03-20 DIAGNOSIS — M961 Postlaminectomy syndrome, not elsewhere classified: Secondary | ICD-10-CM | POA: Diagnosis not present

## 2020-03-20 DIAGNOSIS — I70219 Atherosclerosis of native arteries of extremities with intermittent claudication, unspecified extremity: Secondary | ICD-10-CM | POA: Diagnosis not present

## 2020-03-20 DIAGNOSIS — Z5181 Encounter for therapeutic drug level monitoring: Secondary | ICD-10-CM | POA: Insufficient documentation

## 2020-03-20 DIAGNOSIS — G894 Chronic pain syndrome: Secondary | ICD-10-CM | POA: Insufficient documentation

## 2020-03-20 DIAGNOSIS — M170 Bilateral primary osteoarthritis of knee: Secondary | ICD-10-CM | POA: Insufficient documentation

## 2020-03-20 DIAGNOSIS — S32000S Wedge compression fracture of unspecified lumbar vertebra, sequela: Secondary | ICD-10-CM | POA: Diagnosis not present

## 2020-03-20 DIAGNOSIS — M2550 Pain in unspecified joint: Secondary | ICD-10-CM | POA: Insufficient documentation

## 2020-03-20 DIAGNOSIS — R202 Paresthesia of skin: Secondary | ICD-10-CM | POA: Insufficient documentation

## 2020-03-20 MED ORDER — HYDROCODONE-ACETAMINOPHEN 10-325 MG PO TABS: 1.0000 | ORAL_TABLET | Freq: Four times a day (QID) | ORAL | 0 refills | Status: DC | PRN

## 2020-03-20 NOTE — Progress Notes (Signed)
Subjective:    Patient ID: Crystal Lee, female    DOB: [birth year], 86 y.o.   MRN: [id number]  HPI: Crystal Lee is a 86 y.o. female who returns for follow up appointment for chronic pain and medication refill. She states her pain is located in her lower back, bilateral lower extremities with numbness and tingling. Also reports generalized joint pain and bilateral knee pain. Crystal Lee reports increase intensity of pain with walking and only able to walk short distances, we will send a referral for physical therapy for motorized wheelchair. She verbalizes understanding. She rates her pain 6. Her current exercise regime is walking short distances.   Crystal Lee Morphine equivalent is 40.00 MME.  Last Oral Swab was Performed on 11/22/2019, it was consistent.    Pain Inventory Average Pain 6 Pain Right Now 6 My pain is burning, tingling and aching  In the last 24 hours, has pain interfered with the following? General activity 7 Relation with others 0 Enjoyment of life 5 What TIME of day is your pain at its worst? daytime and evening Sleep (in general) Fair  Pain is worse with: walking, bending and standing Pain improves with: rest, heat/ice and medication Relief from Meds: 7  Family History  Problem Relation Age of Onset  . Other Mother        complications from flu  . Other Father        unsure of cause  . Colon cancer Neg Hx    Social History   Socioeconomic History  . Marital status: Widowed    Spouse name: Not on file  . Number of children: 3  . Years of education: 16 years  . Highest education level: Not on file  Occupational History  . Occupation: Retired  Tobacco Use  . Smoking status: Former Smoker    Packs/day: 0.50    Years: 20.00    Pack years: 10.00    Quit date: 02/03/1976    Years since quitting: 44.1  . Smokeless tobacco: Never Used  Vaping Use  . Vaping Use: Never used  Substance and Sexual Activity  . Alcohol use: No  . Drug use: No  . Sexual  activity: Yes    Birth control/protection: Post-menopausal  Other Topics Concern  . Not on file  Social History Narrative   Lives at home with husband.   Right-handed.   No caffeine use.   Social Determinants of Health   Financial Resource Strain:   . Difficulty of Paying Living Expenses: Not on file  Food Insecurity:   . Worried About Charity fundraiser in the Last Year: Not on file  . Ran Out of Food in the Last Year: Not on file  Transportation Needs:   . Lack of Transportation (Medical): Not on file  . Lack of Transportation (Non-Medical): Not on file  Physical Activity:   . Days of Exercise per Week: Not on file  . Minutes of Exercise per Session: Not on file  Stress:   . Feeling of Stress : Not on file  Social Connections:   . Frequency of Communication with Friends and Family: Not on file  . Frequency of Social Gatherings with Friends and Family: Not on file  . Attends Religious Services: Not on file  . Active Member of Clubs or Organizations: Not on file  . Attends Archivist Meetings: Not on file  . Marital Status: Not on file   Past Surgical History:  Procedure Laterality Date  . 24  HOUR [place] Walk STUDY N/A 06/21/2018   Procedure: 24 HOUR PH STUDY;  Surgeon: Lavena Bullion, DO;  [facility];  Service: Gastroenterology;  Laterality: N/A;  with impedance  . ABDOMINAL HYSTERECTOMY  2010  . ANAL RECTAL MANOMETRY N/A 09/19/2019   Procedure: ANO RECTAL MANOMETRY;  Surgeon: Mauri Pole, MD;  [facility];  Service: Endoscopy;  Laterality: N/A;  . APPENDECTOMY  1949  . [place]  . BREAST SURGERY    . CATARACT EXTRACTION Bilateral 2011  . CHOLECYSTECTOMY    . ESOPHAGEAL MANOMETRY N/A 06/21/2018   Procedure: ESOPHAGEAL MANOMETRY (EM);  Surgeon: Lavena Bullion, DO;  [facility];  Service: Gastroenterology;  Laterality: N/A;  . GALLBLADDER SURGERY  2015  . JOINT REPLACEMENT     RT TOTAL HIP / RT TOTAL KNEE  .  MASTECTOMY  1998   BILATERAL   . [place] IMPEDANCE STUDY  06/21/2018   Procedure: [place] IMPEDANCE STUDY;  Surgeon: Lavena Bullion, DO;  [facility];  Service: Gastroenterology;;  . SKIN CANCER EXCISION  2016   RT SIDE OF NOSE  . TONSILLECTOMY  1953  . TOTAL HIP ARTHROPLASTY  2010   RIGHT  . TOTAL HIP ARTHROPLASTY Left 05/09/2015   Procedure: LEFT TOTAL HIP ARTHROPLASTY ANTERIOR APPROACH;  Surgeon: Gaynelle Arabian, MD;  [facility];  Service: Orthopedics;  Laterality: Left;  . TOTAL KNEE ARTHROPLASTY  2001  . TUMOR REMOVAL  2012   ABDOMINAL - NON CANCEROUS   Past Surgical History:  Procedure Laterality Date  . [place] STUDY N/A 06/21/2018   Procedure: [place] STUDY;  Surgeon: Lavena Bullion, DO;  [facility];  Service: Gastroenterology;  Laterality: N/A;  with impedance  . ABDOMINAL HYSTERECTOMY  2010  . ANAL RECTAL MANOMETRY N/A 09/19/2019   Procedure: ANO RECTAL MANOMETRY;  Surgeon: Mauri Pole, MD;  [facility];  Service: Endoscopy;  Laterality: N/A;  . APPENDECTOMY  1949  . [place]  . BREAST SURGERY    . CATARACT EXTRACTION Bilateral 2011  . CHOLECYSTECTOMY    . ESOPHAGEAL MANOMETRY N/A 06/21/2018   Procedure: ESOPHAGEAL MANOMETRY (EM);  Surgeon: Lavena Bullion, DO;  [facility];  Service: Gastroenterology;  Laterality: N/A;  . GALLBLADDER SURGERY  2015  . JOINT REPLACEMENT     RT TOTAL HIP / RT TOTAL KNEE  . MASTECTOMY  1998   BILATERAL   . [place] IMPEDANCE STUDY  06/21/2018   Procedure: [place] IMPEDANCE STUDY;  Surgeon: Lavena Bullion, DO;  [facility];  Service: Gastroenterology;;  . SKIN CANCER EXCISION  2016   RT SIDE OF NOSE  . TONSILLECTOMY  1953  . TOTAL HIP ARTHROPLASTY  2010   RIGHT  . TOTAL HIP ARTHROPLASTY Left 05/09/2015   Procedure: LEFT TOTAL HIP ARTHROPLASTY ANTERIOR APPROACH;  Surgeon: Gaynelle Arabian, MD;  [facility];  Service: Orthopedics;  Laterality: Left;  . TOTAL KNEE  ARTHROPLASTY  2001  . TUMOR REMOVAL  2012   ABDOMINAL - NON CANCEROUS   Past Medical History:  Diagnosis Date  . Arthritis   . Cancer (HCC)    HX BREAST CANCER/ SKIN CANCER  . Complication of anesthesia    N/V WITH MORPHINE  . Difficulty sleeping   . Fractured hip ([place])    LEFT - AUG 2016  . GERD (gastroesophageal reflux disease)   . Hyperlipidemia   . Hypertension   . Melanoma ([place])   .  Neuropathy   . Nocturia   . Osteopenia   . 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 PONV (postoperative nausea and vomiting)    PT STATES MORPHINE CAUSED N/V  . Rosacea   . Sleep apnea   . Stage 1 breast cancer, ER+, right ([facility]) 07/04/2017   BP 115/73   Pulse 80   Temp 98.3 F (36.8 C)   Ht 5\' 6"  (1.676 m)   Wt 226 lb 6.4 oz (102.7 kg)   SpO2 96%   BMI 36.54 kg/m   Opioid Risk Score:   Fall Risk Score:  `1  Depression screen PHQ 2/9  Depression screen [facility] 2/9 12/24/2019 11/22/2018 09/28/2018 06/02/2018 04/10/2018 02/20/2018 10/17/2017  Decreased Interest 0 0 0 0 0 0 0  Down, Depressed, Hopeless 0 0 0 0 0 0 0  PHQ - 2 Score 0 0 0 0 0 0 0  Altered sleeping - - - - - - 3  Tired, decreased energy - - - - - - 3  Change in appetite - - - - - - 0  Feeling bad or failure about yourself  - - - - - - 0  Trouble concentrating - - - - - - 0  Moving slowly or fidgety/restless - - - - - - 0  Suicidal thoughts - - - - - - 0  PHQ-9 Score - - - - - - 6  Difficult doing work/chores - - - - - - Somewhat difficult    Review of Systems  Musculoskeletal: Positive for back pain and gait problem.  All other systems reviewed and are negative.      Objective:   Physical Exam Vitals and nursing note reviewed.  Constitutional:      Appearance: Normal appearance.  Cardiovascular:     Rate and Rhythm: Normal rate and regular rhythm.     Pulses: Normal pulses.     Heart sounds: Normal heart sounds.  Pulmonary:     Effort: Pulmonary effort is normal.     Breath sounds: Normal breath  sounds.  Musculoskeletal:     Cervical back: Normal range of motion and neck supple.     Comments: Normal Muscle Bulk and Muscle Testing Reveals:  Upper Extremities: Full ROM and Muscle Strength 5/5 Lumbar Paraspinal Tenderness: L-3-L-5 Lower Extremities: Full ROM and Muscle Strength 5/5 Arises from Chair slowly using walker for support Narrow Based Gait   Skin:    General: Skin is warm and dry.  Neurological:     Mental Status: She is alert and oriented to person, place, and time.  Psychiatric:        Mood and Affect: Mood normal.        Behavior: Behavior normal.           Assessment & Plan:  1. Chronic Bilateral Leg pain: Paresthesia: Continue to Monitor.03/20/2020 2.Paresthesia /Lumbar Radiculitis:Ms. Harrellhasweaned herself off the Lyrica due to daytime drowsiness. We will continue to monitor.03/20/2020 3. Pain of Left Wrist/:No complaints today.S/P Carpal Tunnel Release on 06/03/2017 by Dr. Ellene Route. Dr. Ellene Route Following.03/20/2020. 4. Fracture of superior pubic ramus.Dr. AluisioFollowing.Continue to monitor.03/20/2020. 5.BilateralKnee OA: Continue Voltaren Gel.Continue to monitor. Orthopedist following.03/20/2020. 6. Polyarthralgia: Continue to alternate with heat and ice therapy. Continue current medication regime. Continue to monitor.03/20/2020. 7. Chronic Pain Syndrome:Refilled::Hydrocodone10/325 mg one tablet every 6 hours as needed for moderate pain #120.03/20/2020. 8. Lumbar Compression Fracture L2 and L3: Crystal Lee refused physical therapy.Continue with rest/ heat therapy. Continue to Monitor.03/20/2020. 9. Muscle Spasm: ContinueFlexeril 5  mg at HS. Continue to monitor.03/20/2020.  F/U in 1 Month  20 minutes of face to face patient care time was spent during this visit. All questions were encouraged and answered.

## 2020-04-07 ENCOUNTER — Inpatient Hospital Stay (HOSPITAL_BASED_OUTPATIENT_CLINIC_OR_DEPARTMENT_OTHER): Payer: Medicare Other | Admitting: Hematology & Oncology

## 2020-04-07 ENCOUNTER — Encounter: Payer: Self-pay | Admitting: Hematology & Oncology

## 2020-04-07 ENCOUNTER — Other Ambulatory Visit: Payer: Self-pay

## 2020-04-07 ENCOUNTER — Inpatient Hospital Stay: Payer: Medicare Other | Attending: Hematology & Oncology

## 2020-04-07 VITALS — BP 141/59 | HR 80 | Temp 98.1°F | Resp 18 | Wt 228.0 lb

## 2020-04-07 DIAGNOSIS — Z170 Estrogen receptor positive status [ER+]: Secondary | ICD-10-CM

## 2020-04-07 DIAGNOSIS — E785 Hyperlipidemia, unspecified: Secondary | ICD-10-CM

## 2020-04-07 DIAGNOSIS — Z79899 Other long term (current) drug therapy: Secondary | ICD-10-CM | POA: Insufficient documentation

## 2020-04-07 DIAGNOSIS — I70219 Atherosclerosis of native arteries of extremities with intermittent claudication, unspecified extremity: Secondary | ICD-10-CM | POA: Diagnosis not present

## 2020-04-07 DIAGNOSIS — M5459 Other low back pain: Secondary | ICD-10-CM | POA: Insufficient documentation

## 2020-04-07 DIAGNOSIS — Z853 Personal history of malignant neoplasm of breast: Secondary | ICD-10-CM | POA: Diagnosis not present

## 2020-04-07 DIAGNOSIS — C50911 Malignant neoplasm of unspecified site of right female breast: Secondary | ICD-10-CM

## 2020-04-07 DIAGNOSIS — M818 Other osteoporosis without current pathological fracture: Secondary | ICD-10-CM | POA: Insufficient documentation

## 2020-04-07 DIAGNOSIS — M545 Low back pain, unspecified: Secondary | ICD-10-CM

## 2020-04-07 LAB — CBC WITH DIFFERENTIAL (CANCER CENTER ONLY)
Abs Immature Granulocytes: 0.02 10*3/uL (ref 0.00–0.07)
Basophils Absolute: 0 10*3/uL (ref 0.0–0.1)
Basophils Relative: 0 %
Eosinophils Absolute: 0.2 10*3/uL (ref 0.0–0.5)
Eosinophils Relative: 3 %
HCT: 41.5 % (ref 36.0–46.0)
Hemoglobin: 13.3 g/dL (ref 12.0–15.0)
Immature Granulocytes: 0 %
Lymphocytes Relative: 39 %
Lymphs Abs: 2.7 10*3/uL (ref 0.7–4.0)
MCH: 29 pg (ref 26.0–34.0)
MCHC: 32 g/dL (ref 30.0–36.0)
MCV: 90.6 fL (ref 80.0–100.0)
Monocytes Absolute: 0.6 10*3/uL (ref 0.1–1.0)
Monocytes Relative: 9 %
Neutro Abs: 3.3 10*3/uL (ref 1.7–7.7)
Neutrophils Relative %: 49 %
Platelet Count: 135 10*3/uL — ABNORMAL LOW (ref 150–400)
RBC: 4.58 MIL/uL (ref 3.87–5.11)
RDW: 13.1 % (ref 11.5–15.5)
WBC Count: 6.8 10*3/uL (ref 4.0–10.5)
nRBC: 0 % (ref 0.0–0.2)

## 2020-04-07 LAB — CMP (CANCER CENTER ONLY)
ALT: 38 U/L (ref 0–44)
AST: 59 U/L — ABNORMAL HIGH (ref 15–41)
Albumin: 4 g/dL (ref 3.5–5.0)
Alkaline Phosphatase: 113 U/L (ref 38–126)
Anion gap: 6 (ref 5–15)
BUN: 12 mg/dL (ref 8–23)
CO2: 30 mmol/L (ref 22–32)
Calcium: 10.1 mg/dL (ref 8.9–10.3)
Chloride: 102 mmol/L (ref 98–111)
Creatinine: 0.66 mg/dL (ref 0.44–1.00)
GFR, Estimated: >60 mL/min (ref >60)
Glucose, Bld: 92 mg/dL (ref 70–99)
Potassium: 4.4 mmol/L (ref 3.5–5.1)
Sodium: 138 mmol/L (ref 135–145)
Total Bilirubin: 0.6 mg/dL (ref 0.3–1.2)
Total Protein: 7.2 g/dL (ref 6.5–8.1)

## 2020-04-07 NOTE — Progress Notes (Signed)
Hematology and Oncology Follow Up Visit  Crystal Lee [id number] 08-12-1933 86 y.o. 04/07/2020   Principle Diagnosis:  Bilateral stage Ia breast cancer-1998; thoracic adenopathy of undetermined significance Osteoporosis-aromatase inhibitor induced  Current Therapy:   Zometa 4 mg IV q. Year -- next dose on 11/2020   Interim History:  [name] is here today for follow-up.  She still is bothered by lower back.  She has not seen Dr. Ellene Route.  He has seen her in the past.  She just does not think that anything can be done for her lower back.  She is also worried about her liver test.  Her liver tests they are okay.  She is on cholesterol medicine.  I suspect she is on a statin drug.  She has had no problems with cough or shortness of breath.  She has had no fever.  She has had no nausea or vomiting.  There is been a little bit of leg swelling.  She has had no diarrhea.  There may be a little bit of constipation.  Back in August, she had arterial studies of her lower legs.  Everything looked fine.  There is no evidence of arterial flow disease.  Currently, I would say performance status is ECOG 2.    Medications:  Allergies as of 04/07/2020      Reactions   Adhesive [tape]    Morphine And Related Nausea And Vomiting      Medication List       Accurate as of April 07, 2020  2:29 PM. If you have any questions, ask your nurse or doctor.        STOP taking these medications   doxycycline 100 MG capsule Commonly known as: VIBRAMYCIN Stopped by: [name], MD   Trulance 3 MG Tabs Generic drug: Plecanatide Stopped by: [name], MD     TAKE these medications   CALCIUM PO Take 1,200 mg by mouth daily.   cyclobenzaprine 5 MG tablet Commonly known as: FLEXERIL TAKE ONE TABLET BY MOUTH AT BEDTIME AS NEEDED FOR MUSCLE SPASMS   ergocalciferol 1.25 MG (50000 UT) capsule Commonly known as: VITAMIN D2 Take 1 capsule (50,000 Units total) by mouth once a week.    fenofibrate 48 MG tablet Commonly known as: Tricor Take 1 tablet (48 mg total) by mouth daily.   fluorouracil 5 % cream Commonly known as: EFUDEX fluorouracil 5 % topical cream   furosemide 20 MG tablet Commonly known as: LASIX Take 1 tablet (20 mg total) by mouth daily.   HYDROcodone-acetaminophen 10-325 MG tablet Commonly known as: NORCO Take 1 tablet by mouth every 6 (six) hours as needed.   ketoconazole 2 % cream Commonly known as: NIZORAL SMARTSIG:1 Application Topical 1 to 2 Times Daily   losartan 25 MG tablet Commonly known as: COZAAR Take 1 tablet (25 mg total) by mouth daily.   metroNIDAZOLE 1 % gel Commonly known as: Metrogel Apply topically daily. Use as needed for rosacea   ondansetron 4 MG disintegrating tablet Commonly known as: ZOFRAN-ODT as needed.   potassium chloride 10 MEQ tablet Commonly known as: KLOR-CON Take 1 tablet (10 mEq total) by mouth daily.       Allergies:  Allergies  Allergen Reactions   Adhesive [Tape]    Morphine And Related Nausea And Vomiting    Past Medical History, Surgical history, Social history, and Family History were reviewed and updated.  Review of Systems: Review of Systems  Constitutional: Negative.   HENT: Negative.  Eyes: Negative.   Respiratory: Negative.   Cardiovascular: Positive for leg swelling.  Gastrointestinal: Positive for constipation.  Genitourinary: Negative.   Musculoskeletal: Negative.   Skin: Negative.   Neurological: Negative.   Endo/Heme/Allergies: Negative.   Psychiatric/Behavioral: Negative.      Physical Exam:  weight is 228 lb (103.4 kg). Her oral temperature is 98.1 F (36.7 C). Her blood pressure is 141/59 (abnormal) and her pulse is 80. Her respiration is 18 and oxygen saturation is 95%.   Wt Readings from Last 3 Encounters:  04/07/20 228 lb (103.4 kg)  03/20/20 226 lb 6.4 oz (102.7 kg)  02/25/20 227 lb 6.4 oz (103.1 kg)    Physical Exam Vitals reviewed.  HENT:      Head: Normocephalic and atraumatic.  Eyes:     Pupils: Pupils are equal, round, and reactive to light.  Cardiovascular:     Rate and Rhythm: Normal rate and regular rhythm.     Heart sounds: Normal heart sounds.  Pulmonary:     Effort: Pulmonary effort is normal.     Breath sounds: Normal breath sounds.  Abdominal:     General: Bowel sounds are normal.     Palpations: Abdomen is soft.  Musculoskeletal:        General: No tenderness or deformity. Normal range of motion.     Cervical back: Normal range of motion.  Lymphadenopathy:     Cervical: No cervical adenopathy.  Skin:    General: Skin is warm and dry.     Findings: No erythema or rash.  Neurological:     Mental Status: She is alert and oriented to person, place, and time.  Psychiatric:        Behavior: Behavior normal.        Thought Content: Thought content normal.        Judgment: Judgment normal.      Lab Results  Component Value Date   WBC 6.8 04/07/2020   HGB 13.3 04/07/2020   HCT 41.5 04/07/2020   MCV 90.6 04/07/2020   PLT 135 (L) 04/07/2020   Lab Results  Component Value Date   FERRITIN 131 02/09/2017   Lab Results  Component Value Date   RBC 4.58 04/07/2020   No results found for: [name] [facility] Lab Results  Component Value Date   IGGSERUM 1,085 02/09/2017   IGMSERUM 336 (H) 02/09/2017   No results found for: Crystal Lee, SPEI   Chemistry      Component Value Date/Time   NA 138 04/07/2020 1331   NA 139 02/25/2020 1627   K 4.4 04/07/2020 1331   CL 102 04/07/2020 1331   CO2 30 04/07/2020 1331   BUN 12 04/07/2020 1331   BUN 11 02/25/2020 1627   CREATININE 0.66 04/07/2020 1331      Component Value Date/Time   CALCIUM 10.1 04/07/2020 1331   ALKPHOS 113 04/07/2020 1331   AST 59 (H) 04/07/2020 1331   ALT 38 04/07/2020 1331   BILITOT 0.6 04/07/2020 1331       Impression and Plan: [name] is a very pleasant  86 yo caucasian female with history of bilateral stage Ia breast cancer diagnosed back in 1998.  I do not see any issues with respect to the breast cancer.  I just hope that the back will feel better.  I did state that she is really limited by her back.  I would like to see her back in 3 months.  I  does want to make sure that everything seems to be moving in the right direction for her.  She is not due for the Zometa until July 2022.  We will check her lipid panel when we see her back.    [name], MD 11/8/20212:29 PM

## 2020-04-09 ENCOUNTER — Telehealth: Payer: Self-pay | Admitting: Registered Nurse

## 2020-04-09 DIAGNOSIS — H01005 Unspecified blepharitis left lower eyelid: Secondary | ICD-10-CM | POA: Diagnosis not present

## 2020-04-09 DIAGNOSIS — H01002 Unspecified blepharitis right lower eyelid: Secondary | ICD-10-CM | POA: Diagnosis not present

## 2020-04-09 NOTE — Telephone Encounter (Signed)
Received a fax from [facility] stating Crystal Lee walked into their facility wanting to pick out a scooter. This provider placed a Physical Therapy referral on 03/20/2020. Crystal Lee states no one has called her, this provider placed a call to [facility], they have her on the waiting list. This provider placed another call to Crystal Lee and explained the above, she verbalizes understanding.

## 2020-04-22 ENCOUNTER — Other Ambulatory Visit: Payer: Self-pay

## 2020-04-22 ENCOUNTER — Encounter: Payer: Self-pay | Admitting: Registered Nurse

## 2020-04-22 ENCOUNTER — Encounter: Payer: Medicare Other | Attending: Physical Medicine & Rehabilitation | Admitting: Registered Nurse

## 2020-04-22 VITALS — BP 134/76 | HR 72 | Temp 98.9°F | Ht 66.0 in | Wt 227.6 lb

## 2020-04-22 DIAGNOSIS — M7061 Trochanteric bursitis, right hip: Secondary | ICD-10-CM | POA: Diagnosis not present

## 2020-04-22 DIAGNOSIS — M2550 Pain in unspecified joint: Secondary | ICD-10-CM | POA: Insufficient documentation

## 2020-04-22 DIAGNOSIS — M62838 Other muscle spasm: Secondary | ICD-10-CM | POA: Insufficient documentation

## 2020-04-22 DIAGNOSIS — G894 Chronic pain syndrome: Secondary | ICD-10-CM

## 2020-04-22 DIAGNOSIS — S32000S Wedge compression fracture of unspecified lumbar vertebra, sequela: Secondary | ICD-10-CM | POA: Insufficient documentation

## 2020-04-22 DIAGNOSIS — M170 Bilateral primary osteoarthritis of knee: Secondary | ICD-10-CM | POA: Insufficient documentation

## 2020-04-22 DIAGNOSIS — M961 Postlaminectomy syndrome, not elsewhere classified: Secondary | ICD-10-CM | POA: Diagnosis not present

## 2020-04-22 DIAGNOSIS — Z5181 Encounter for therapeutic drug level monitoring: Secondary | ICD-10-CM

## 2020-04-22 DIAGNOSIS — R202 Paresthesia of skin: Secondary | ICD-10-CM | POA: Insufficient documentation

## 2020-04-22 DIAGNOSIS — I70219 Atherosclerosis of native arteries of extremities with intermittent claudication, unspecified extremity: Secondary | ICD-10-CM

## 2020-04-22 DIAGNOSIS — Z79899 Other long term (current) drug therapy: Secondary | ICD-10-CM | POA: Diagnosis not present

## 2020-04-22 DIAGNOSIS — M7062 Trochanteric bursitis, left hip: Secondary | ICD-10-CM

## 2020-04-22 MED ORDER — HYDROCODONE-ACETAMINOPHEN 10-325 MG PO TABS: 1.0000 | ORAL_TABLET | Freq: Four times a day (QID) | ORAL | 0 refills | Status: DC | PRN

## 2020-04-22 NOTE — Progress Notes (Signed)
Subjective:    Patient ID: Crystal Lee, female    DOB: [birth year], 86 y.o.   MRN: [id number]  HPI: Crystal Lee is a 86 y.o. female who returns for follow up appointment for chronic pain and medication refill. She states her pain is located in her mid- lower back, bilateral hips, bilateral lower extremities with tingling and bilateral feet pain with burning. Also reports bilateral knee pain and generalized joint pain. She rates her pain 5. Her current exercise regime is walking and performing stretching exercises.  [name] is 40.00 MME. Last Oral Swab was Performed on 11/22/2019,  It was consistent.   Pain Inventory Average Pain 5 Pain Right Now 5 My pain is sharp, burning and tingling  In the last 24 hours, has pain interfered with the following? General activity 7 Relation with others 7 Enjoyment of life 7 What TIME of day is your pain at its worst? evening and night Sleep (in general) Fair  Pain is worse with: walking, bending, standing and some activites Pain improves with: rest, heat/ice and medication Relief from Meds: 5  Family History  Problem Relation Age of Onset  . Other Mother        complications from flu  . Other Father        unsure of cause  . Colon cancer Neg Hx    Social History   Socioeconomic History  . Marital status: Widowed    Spouse name: Not on file  . Number of children: 3  . Years of education: 16 years  . Highest education level: Not on file  Occupational History  . Occupation: Retired  Tobacco Use  . Smoking status: Former Smoker    Packs/day: 0.50    Years: 20.00    Pack years: 10.00    Quit date: 02/03/1976    Years since quitting: 44.2  . Smokeless tobacco: Never Used  Vaping Use  . Vaping Use: Never used  Substance and Sexual Activity  . Alcohol use: No  . Drug use: No  . Sexual activity: Yes    Birth control/protection: Post-menopausal  Other Topics Concern  . Not on file  Social History Narrative    Lives at home with husband.   Right-handed.   No caffeine use.   Social Determinants of Health   Financial Resource Strain:   . Difficulty of Paying Living Expenses: Not on file  Food Insecurity:   . Worried About Charity fundraiser in the Last Year: Not on file  . Ran Out of Food in the Last Year: Not on file  Transportation Needs:   . Lack of Transportation (Medical): Not on file  . Lack of Transportation (Non-Medical): Not on file  Physical Activity:   . Days of Exercise per Week: Not on file  . Minutes of Exercise per Session: Not on file  Stress:   . Feeling of Stress : Not on file  Social Connections:   . Frequency of Communication with Friends and Family: Not on file  . Frequency of Social Gatherings with Friends and Family: Not on file  . Attends Religious Services: Not on file  . Active Member of Clubs or Organizations: Not on file  . Attends Archivist Meetings: Not on file  . Marital Status: Not on file   Past Surgical History:  Procedure Laterality Date  . [place] STUDY N/A 06/21/2018   Procedure: [place] STUDY;  Surgeon: Lavena Bullion, DO;  Location: [facility];  Service: Gastroenterology;  Laterality: N/A;  with impedance  . ABDOMINAL HYSTERECTOMY  2010  . ANAL RECTAL MANOMETRY N/A 09/19/2019   Procedure: ANO RECTAL MANOMETRY;  Surgeon: Mauri Pole, MD;  Location: [facility];  Service: Endoscopy;  Laterality: N/A;  . APPENDECTOMY  1949  . [place]  . BREAST SURGERY    . CATARACT EXTRACTION Bilateral 2011  . CHOLECYSTECTOMY    . ESOPHAGEAL MANOMETRY N/A 06/21/2018   Procedure: ESOPHAGEAL MANOMETRY (EM);  Surgeon: Lavena Bullion, DO;  Location: [facility];  Service: Gastroenterology;  Laterality: N/A;  . GALLBLADDER SURGERY  2015  . JOINT REPLACEMENT     RT TOTAL HIP / RT TOTAL KNEE  . MASTECTOMY  1998   BILATERAL   . [place] IMPEDANCE STUDY  06/21/2018   Procedure: [place] IMPEDANCE STUDY;  Surgeon: Lavena Bullion,  DO;  Location: [facility];  Service: Gastroenterology;;  . SKIN CANCER EXCISION  2016   RT SIDE OF NOSE  . TONSILLECTOMY  1953  . TOTAL HIP ARTHROPLASTY  2010   RIGHT  . TOTAL HIP ARTHROPLASTY Left 05/09/2015   Procedure: LEFT TOTAL HIP ARTHROPLASTY ANTERIOR APPROACH;  Surgeon: Gaynelle Arabian, MD;  Location: [facility];  Service: Orthopedics;  Laterality: Left;  . TOTAL KNEE ARTHROPLASTY  2001  . TUMOR REMOVAL  2012   ABDOMINAL - NON CANCEROUS   Past Surgical History:  Procedure Laterality Date  . [place] STUDY N/A 06/21/2018   Procedure: [place] STUDY;  Surgeon: Lavena Bullion, DO;  [facility];  Service: Gastroenterology;  Laterality: N/A;  with impedance  . ABDOMINAL HYSTERECTOMY  2010  . ANAL RECTAL MANOMETRY N/A 09/19/2019   Procedure: ANO RECTAL MANOMETRY;  Surgeon: Mauri Pole, MD;  Location: [facility];  Service: Endoscopy;  Laterality: N/A;  . APPENDECTOMY  1949  . [place]  . BREAST SURGERY    . CATARACT EXTRACTION Bilateral 2011  . CHOLECYSTECTOMY    . ESOPHAGEAL MANOMETRY N/A 06/21/2018   Procedure: ESOPHAGEAL MANOMETRY (EM);  Surgeon: Lavena Bullion, DO;  [facility];  Service: Gastroenterology;  Laterality: N/A;  . GALLBLADDER SURGERY  2015  . JOINT REPLACEMENT     RT TOTAL HIP / RT TOTAL KNEE  . MASTECTOMY  1998   BILATERAL   . [place] IMPEDANCE STUDY  06/21/2018   Procedure: [place] IMPEDANCE STUDY;  Surgeon: Lavena Bullion, DO;  Location[facility];  Service: Gastroenterology;;  . SKIN CANCER EXCISION  2016   RT SIDE OF NOSE  . TONSILLECTOMY  1953  . TOTAL HIP ARTHROPLASTY  2010   RIGHT  . TOTAL HIP ARTHROPLASTY Left 05/09/2015   Procedure: LEFT TOTAL HIP ARTHROPLASTY ANTERIOR APPROACH;  Surgeon: Gaynelle Arabian, MD;  Location: [facility];  Service: Orthopedics;  Laterality: Left;  . TOTAL KNEE ARTHROPLASTY  2001  . TUMOR REMOVAL  2012   ABDOMINAL - NON CANCEROUS   Past Medical History:  Diagnosis Date  . Arthritis     . Cancer (HCC)    HX BREAST CANCER/ SKIN CANCER  . Complication of anesthesia    N/V WITH Lee  . Difficulty sleeping   . Fractured hip ([place])    LEFT - AUG 2016  . GERD (gastroesophageal reflux disease)   . Hyperlipidemia   . Hypertension   . Melanoma ([place])   . Neuropathy   . Nocturia   . Osteopenia   . 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  PONV (postoperative nausea and vomiting)    PT STATES Lee CAUSED N/V  . Rosacea   . Sleep apnea   . Stage 1 breast cancer, ER+, right (HCC) 07/04/2017   BP 134/76   Pulse 72   Temp 98.9 F (37.2 C)   Ht 5\' 6"  (1.676 m)   Wt 227 lb 9.6 oz (103.2 kg)   SpO2 96%   BMI 36.74 kg/m   Opioid Risk Score:   Fall Risk Score:  `1  Depression screen PHQ 2/9  Depression screen [facility] 2/9 12/24/2019 11/22/2018 09/28/2018 06/02/2018 04/10/2018 02/20/2018 10/17/2017  Decreased Interest 0 0 0 0 0 0 0  Down, Depressed, Hopeless 0 0 0 0 0 0 0  PHQ - 2 Score 0 0 0 0 0 0 0  Altered sleeping - - - - - - 3  Tired, decreased energy - - - - - - 3  Change in appetite - - - - - - 0  Feeling bad or failure about yourself  - - - - - - 0  Trouble concentrating - - - - - - 0  Moving slowly or fidgety/restless - - - - - - 0  Suicidal thoughts - - - - - - 0  PHQ-9 Score - - - - - - 6  Difficult doing work/chores - - - - - - Somewhat difficult   Review of Systems  Musculoskeletal: Positive for back pain.       Leg pain  All other systems reviewed and are negative.      Objective:   Physical Exam Vitals and nursing note reviewed.  Constitutional:      Appearance: Normal appearance.  Cardiovascular:     Rate and Rhythm: Normal rate and regular rhythm.     Pulses: Normal pulses.     Heart sounds: Normal heart sounds.  Pulmonary:     Effort: Pulmonary effort is normal.     Breath sounds: Normal breath sounds.  Musculoskeletal:     Cervical back: Normal range of motion and neck supple.     Comments: Normal Muscle Bulk and Muscle  Testing Reveals:  Upper Extremities: Full ROM and Muscle Strength 5/5 Thoracic Paraspinal Tenderness: T-3-T-9  Lumbar Paraspinal Tenderness: L-3-L-5 Bilateral Greater Trochanter Tenderness Lower Extremities: Full ROM and Muscle Strength 5/5 Bilateral Lower Extremities Flexion Produces Pain into her Bilateral Lower Extremities Arises from Table Slowly using walker for support Narrow Based Gait   Skin:    General: Skin is warm and dry.  Neurological:     Mental Status: She is alert and oriented to person, place, and time.  Psychiatric:        Mood and Affect: Mood normal.        Behavior: Behavior normal.           Assessment & Plan:  1. Chronic Bilateral Leg pain: Paresthesia: Continue to Monitor.04/22/2020 2.Paresthesia /Lumbar Radiculitis:Ms. Harrellhasweaned herself off the Lyrica due to daytime drowsiness. We will continue to monitor.04/22/2020 3. Pain of Left Wrist/:No complaints today.S/P Carpal Tunnel Release on 06/03/2017 by Dr. Ellene Route. Dr. Ellene Route Following.04/22/2020. 4. Fracture of superior pubic ramus.Dr. AluisioFollowing.Continue to monitor.04/22/2020. 5.BilateralKnee OA: Continue Voltaren Gel.Continue to monitor. Orthopedist following.04/22/2020. 6. Polyarthralgia: Continue to alternate with heat and ice therapy. Continue current medication regime. Continue to monitor.04/22/2020. 7. Chronic Pain Syndrome:Refilled::Hydrocodone10/325 mg one tablet every 6 hours as needed for moderate pain #120.04/22/2020. 8. Lumbar Compression Fracture L2 and L3: Crystal Lee refused physical therapy.Continue with rest/ heat therapy. Continue to Monitor.04/22/2020. 9.  Muscle Spasm: ContinueFlexeril 5 mg at HS. Continue to monitor.04/22/2020.  F/U in 1 Month

## 2020-05-19 ENCOUNTER — Ambulatory Visit: Payer: Medicare Other | Admitting: Registered Nurse

## 2020-05-20 ENCOUNTER — Encounter: Payer: Self-pay | Admitting: Registered Nurse

## 2020-05-20 ENCOUNTER — Other Ambulatory Visit: Payer: Self-pay

## 2020-05-20 ENCOUNTER — Encounter: Payer: Medicare Other | Attending: Physical Medicine & Rehabilitation | Admitting: Registered Nurse

## 2020-05-20 VITALS — BP 127/77 | HR 94 | Temp 98.3°F | Ht 66.0 in | Wt 225.0 lb

## 2020-05-20 DIAGNOSIS — G894 Chronic pain syndrome: Secondary | ICD-10-CM | POA: Diagnosis not present

## 2020-05-20 DIAGNOSIS — M170 Bilateral primary osteoarthritis of knee: Secondary | ICD-10-CM | POA: Insufficient documentation

## 2020-05-20 DIAGNOSIS — S32000S Wedge compression fracture of unspecified lumbar vertebra, sequela: Secondary | ICD-10-CM | POA: Insufficient documentation

## 2020-05-20 DIAGNOSIS — M62838 Other muscle spasm: Secondary | ICD-10-CM | POA: Diagnosis not present

## 2020-05-20 DIAGNOSIS — R202 Paresthesia of skin: Secondary | ICD-10-CM | POA: Insufficient documentation

## 2020-05-20 DIAGNOSIS — Z79899 Other long term (current) drug therapy: Secondary | ICD-10-CM | POA: Insufficient documentation

## 2020-05-20 DIAGNOSIS — M961 Postlaminectomy syndrome, not elsewhere classified: Secondary | ICD-10-CM | POA: Diagnosis not present

## 2020-05-20 DIAGNOSIS — M7062 Trochanteric bursitis, left hip: Secondary | ICD-10-CM | POA: Diagnosis not present

## 2020-05-20 DIAGNOSIS — I70219 Atherosclerosis of native arteries of extremities with intermittent claudication, unspecified extremity: Secondary | ICD-10-CM

## 2020-05-20 DIAGNOSIS — M2550 Pain in unspecified joint: Secondary | ICD-10-CM | POA: Insufficient documentation

## 2020-05-20 DIAGNOSIS — Z5181 Encounter for therapeutic drug level monitoring: Secondary | ICD-10-CM | POA: Diagnosis not present

## 2020-05-20 DIAGNOSIS — M7061 Trochanteric bursitis, right hip: Secondary | ICD-10-CM | POA: Insufficient documentation

## 2020-05-20 MED ORDER — HYDROCODONE-ACETAMINOPHEN 10-325 MG PO TABS: 1.0000 | ORAL_TABLET | Freq: Four times a day (QID) | ORAL | 0 refills | Status: DC | PRN

## 2020-05-20 NOTE — Progress Notes (Signed)
Subjective:    Patient ID: Crystal Lee, female    DOB: 27-Aug-1933, 86 y.o.   MRN: [id number]  HPI: Crystal Lee is a 86 y.o. female who returns for follow up appointment for chronic pain and medication refill. She states her pain is located in her lower back, bilateral hips and bilateral lower extremities with numbness and tingling. Also reports bilateral knee pain and generalized joint pain. She rates her pain 5. Her current exercise regime is walking.  [name] Morphine equivalent is 40.00 MME.    Last Oral Swab was Performed on 11/22/2019, it was consistent.    Pain Inventory Average Pain 5 Pain Right Now 5 My pain is constant, burning, tingling and aching  In the last 24 hours, has pain interfered with the following? General activity 6 Relation with others 4 Enjoyment of life 4 What TIME of day is your pain at its worst? daytime Sleep (in general) Poor  Pain is worse with: walking, bending and standing Pain improves with: rest, medication and heat Relief from Meds: fair  Family History  Problem Relation Age of Onset  . Other Mother        complications from flu  . Other Father        unsure of cause  . Colon cancer Neg Hx    Social History   Socioeconomic History  . Marital status: Widowed    Spouse name: Not on file  . Number of children: 3  . Years of education: 16 years  . Highest education level: Not on file  Occupational History  . Occupation: Retired  Tobacco Use  . Smoking status: Former Smoker    Packs/day: 0.50    Years: 20.00    Pack years: 10.00    Quit date: 02/03/1976    Years since quitting: 44.3  . Smokeless tobacco: Never Used  Vaping Use  . Vaping Use: Never used  Substance and Sexual Activity  . Alcohol use: No  . Drug use: No  . Sexual activity: Yes    Birth control/protection: Post-menopausal  Other Topics Concern  . Not on file  Social History Narrative   Lives at home with husband.   Right-handed.   No caffeine use.    Social Determinants of Health   Financial Resource Strain: Not on file  Food Insecurity: Not on file  Transportation Needs: Not on file  Physical Activity: Not on file  Stress: Not on file  Social Connections: Not on file   Past Surgical History:  Procedure Laterality Date  . [place] STUDY N/A 06/21/2018   Procedure: [place] STUDY;  Surgeon: Lavena Bullion, DO;  Location: [facility];  Service: Gastroenterology;  Laterality: N/A;  with impedance  . ABDOMINAL HYSTERECTOMY  2010  . ANAL RECTAL MANOMETRY N/A 09/19/2019   Procedure: ANO RECTAL MANOMETRY;  Surgeon: Mauri Pole, MD;  Location: [facility];  Service: Endoscopy;  Laterality: N/A;  . APPENDECTOMY  1949  . [place]  . BREAST SURGERY    . CATARACT EXTRACTION Bilateral 2011  . CHOLECYSTECTOMY    . ESOPHAGEAL MANOMETRY N/A 06/21/2018   Procedure: ESOPHAGEAL MANOMETRY (EM);  Surgeon: Lavena Bullion, DO;  [facility];  Service: Gastroenterology;  Laterality: N/A;  . GALLBLADDER SURGERY  2015  . JOINT REPLACEMENT     RT TOTAL HIP / RT TOTAL KNEE  . MASTECTOMY  1998   BILATERAL   . [place] IMPEDANCE STUDY  06/21/2018   Procedure: [place]  IMPEDANCE STUDY;  Surgeon: Lavena Bullion, DO;  [facility];  Service: Gastroenterology;;  . SKIN CANCER EXCISION  2016   RT SIDE OF NOSE  . TONSILLECTOMY  1953  . TOTAL HIP ARTHROPLASTY  2010   RIGHT  . TOTAL HIP ARTHROPLASTY Left 05/09/2015   Procedure: LEFT TOTAL HIP ARTHROPLASTY ANTERIOR APPROACH;  Surgeon: Gaynelle Arabian, MD;  Location: [facility];  Service: Orthopedics;  Laterality: Left;  . TOTAL KNEE ARTHROPLASTY  2001  . TUMOR REMOVAL  2012   ABDOMINAL - NON CANCEROUS   Past Surgical History:  Procedure Laterality Date  . [place] STUDY N/A 06/21/2018   Procedure: [place] STUDY;  Surgeon: Lavena Bullion, DO;  [facility];  Service: Gastroenterology;  Laterality: N/A;  with impedance  . ABDOMINAL HYSTERECTOMY  2010  .  ANAL RECTAL MANOMETRY N/A 09/19/2019   Procedure: ANO RECTAL MANOMETRY;  Surgeon: Mauri Pole, MD;  [facility];  Service: Endoscopy;  Laterality: N/A;  . APPENDECTOMY  1949  . [place]  . BREAST SURGERY    . CATARACT EXTRACTION Bilateral 2011  . CHOLECYSTECTOMY    . ESOPHAGEAL MANOMETRY N/A 06/21/2018   Procedure: ESOPHAGEAL MANOMETRY (EM);  Surgeon: Lavena Bullion, DO;  [facility];  Service: Gastroenterology;  Laterality: N/A;  . GALLBLADDER SURGERY  2015  . JOINT REPLACEMENT     RT TOTAL HIP / RT TOTAL KNEE  . MASTECTOMY  1998   BILATERAL   . [place] IMPEDANCE STUDY  06/21/2018   Procedure: [place] IMPEDANCE STUDY;  Surgeon: Lavena Bullion, DO;  [facility];  Service: Gastroenterology;;  . SKIN CANCER EXCISION  2016   RT SIDE OF NOSE  . TONSILLECTOMY  1953  . TOTAL HIP ARTHROPLASTY  2010   RIGHT  . TOTAL HIP ARTHROPLASTY Left 05/09/2015   Procedure: LEFT TOTAL HIP ARTHROPLASTY ANTERIOR APPROACH;  Surgeon: Gaynelle Arabian, MD;  [facility];  Service: Orthopedics;  Laterality: Left;  . TOTAL KNEE ARTHROPLASTY  2001  . TUMOR REMOVAL  2012   ABDOMINAL - NON CANCEROUS   Past Medical History:  Diagnosis Date  . Arthritis   . Cancer (HCC)    HX BREAST CANCER/ SKIN CANCER  . Complication of anesthesia    N/V WITH MORPHINE  . Difficulty sleeping   . Fractured hip ([place])    LEFT - AUG 2016  . GERD (gastroesophageal reflux disease)   . Hyperlipidemia   . Hypertension   . Melanoma ([place])   . Neuropathy   . Nocturia   . Osteopenia   . 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 PONV (postoperative nausea and vomiting)    PT STATES MORPHINE CAUSED N/V  . Rosacea   . Sleep apnea   . Stage 1 breast cancer, ER+, right (HCC) 07/04/2017   BP 127/77   Pulse 94   Temp 98.3 F (36.8 C)   Ht 5\' 6"  (1.676 m)   Wt 225 lb (102.1 kg)   SpO2 97%   BMI 36.32 kg/m   Opioid Risk Score:   Fall Risk Score:  `1  Depression screen  PHQ 2/9  Depression screen [facility] 2/9 12/24/2019 11/22/2018 09/28/2018 06/02/2018 04/10/2018 02/20/2018 10/17/2017  Decreased Interest 0 0 0 0 0 0 0  Down, Depressed, Hopeless 0 0 0 0 0 0 0  PHQ - 2 Score 0 0 0 0 0 0 0  Altered sleeping - - - - - - 3  Tired, decreased energy - - - - - - 3  Change in appetite - - - - - - 0  Feeling bad or failure about yourself  - - - - - - 0  Trouble concentrating - - - - - - 0  Moving slowly or fidgety/restless - - - - - - 0  Suicidal thoughts - - - - - - 0  PHQ-9 Score - - - - - - 6  Difficult doing work/chores - - - - - - Somewhat difficult   Review of Systems  Musculoskeletal: Positive for arthralgias, back pain and gait problem.       Pain all over  All other systems reviewed and are negative.      Objective:   Physical Exam Vitals and nursing note reviewed.  Constitutional:      Appearance: Normal appearance.  Cardiovascular:     Rate and Rhythm: Normal rate and regular rhythm.     Pulses: Normal pulses.     Heart sounds: Normal heart sounds.  Pulmonary:     Effort: Pulmonary effort is normal.     Breath sounds: Normal breath sounds.  Musculoskeletal:     Cervical back: Normal range of motion and neck supple.     Comments: Normal Muscle Bulk and Muscle Testing Reveals:  Upper Extremities: Full ROM and Muscle Strength 5/5 Thoracic Paraspinal Tenderness: T-7-T-11 Lumbar Paraspinal Tenderness: L-3-L-5 Lower Extremities: Full ROM and Muscle Strength 5/5 Bilateral Lower Extremities Flexion Produces Pain into her Bilateral Patella's Arises from chair slowly using walker for support Narrow Based  Gait   Skin:    General: Skin is warm and dry.  Neurological:     Mental Status: She is alert and oriented to person, place, and time.  Psychiatric:        Mood and Affect: Mood normal.        Behavior: Behavior normal.           Assessment & Plan:  1. Chronic Bilateral Leg pain: Paresthesia: Continue to Monitor.05/20/2020 2.Paresthesia  /Lumbar Radiculitis:Ms. Harrellhasweaned herself off the Lyrica due to daytime drowsiness. We will continue to monitor.05/20/2020 3. Pain of Left Wrist/:No complaints today.S/P Carpal Tunnel Release on 06/03/2017 by Dr. Ellene Route. Dr. Ellene Route Following.05/20/2020. 4. Fracture of superior pubic ramus.Dr. AluisioFollowing.Continue to monitor.05/20/2020. 5.BilateralKnee OA: Continue Voltaren Gel.Continue to monitor. Orthopedist following.05/20/2020. 6. Polyarthralgia: Continue to alternate with heat and ice therapy. Continue current medication regime. Continue to monitor.05/20/2020. 7. Chronic Pain Syndrome:Refilled::Hydrocodone10/325 mg one tablet every 6 hours as needed for moderate pain #120.05/20/2020. 8. Lumbar Compression Fracture L2 and L3: Ms. Lottman refused physical therapy.Continue with rest/ heat therapy. Continue to Monitor.05/20/2020. 9. Muscle Spasm: ContinueFlexeril 5 mg at HS. Continue to monitor.05/20/2020.  F/U in 1 Month

## 2020-05-27 ENCOUNTER — Ambulatory Visit: Payer: Medicare Other | Attending: Registered Nurse | Admitting: Physical Therapy

## 2020-05-27 ENCOUNTER — Other Ambulatory Visit: Payer: Self-pay

## 2020-05-27 DIAGNOSIS — R2681 Unsteadiness on feet: Secondary | ICD-10-CM | POA: Insufficient documentation

## 2020-05-27 DIAGNOSIS — G8929 Other chronic pain: Secondary | ICD-10-CM | POA: Insufficient documentation

## 2020-05-27 DIAGNOSIS — M545 Low back pain, unspecified: Secondary | ICD-10-CM | POA: Insufficient documentation

## 2020-05-27 DIAGNOSIS — R262 Difficulty in walking, not elsewhere classified: Secondary | ICD-10-CM | POA: Diagnosis not present

## 2020-05-28 ENCOUNTER — Encounter: Payer: Self-pay | Admitting: Physical Therapy

## 2020-05-28 NOTE — Therapy (Addendum)
[facility] [address] [place], [place], [address] Phone: [phone]   Fax:  [phone]  Physical Therapy Evaluation  Patient Details  Name: Crystal Lee MRN: [id number] Date of Birth: August 11, 1933 Referring Provider (PT): Jacalyn Lefevre, NP   Encounter Date: 05/27/2020   PT End of Session - 05/28/20 1857     Visit Number 1    Authorization Type Medicare    Authorization Time Period 05-27-20 - 06-27-20    PT Start Time 1315    PT Stop Time 1408    PT Time Calculation (min) 53 min    Activity Tolerance Patient tolerated treatment well    Behavior During Therapy [facility] for tasks assessed/performed             Past Medical History:  Diagnosis Date   Arthritis    Cancer (HCC)    HX BREAST CANCER/ SKIN CANCER   Complication of anesthesia    N/V WITH MORPHINE   Difficulty sleeping    Fractured hip (HCC)    LEFT - AUG 2016   GERD (gastroesophageal reflux disease)    Hyperlipidemia    Hypertension    Melanoma (HCC)    Neuropathy    Nocturia    Osteopenia    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   PONV (postoperative nausea and vomiting)    PT STATES MORPHINE CAUSED N/V   Rosacea    Sleep apnea    Stage 1 breast cancer, ER+, right (HCC) 07/04/2017    Past Surgical History:  Procedure Laterality Date   39 HOUR PH STUDY N/A 06/21/2018   Procedure: 24 HOUR PH STUDY;  Surgeon: Shellia Cleverly, DO;  [facility];  Service: Gastroenterology;  Laterality: N/A;  with impedance   ABDOMINAL HYSTERECTOMY  2010   ANAL RECTAL MANOMETRY N/A 09/19/2019   Procedure: ANO RECTAL MANOMETRY;  Surgeon: Napoleon Form, MD;  [facility];  Service: Endoscopy;  Laterality: N/A;   APPENDECTOMY  1949   BACK SURGERY  1973   BREAST SURGERY     CATARACT EXTRACTION Bilateral 2011   CHOLECYSTECTOMY     ESOPHAGEAL MANOMETRY N/A 06/21/2018   Procedure: ESOPHAGEAL MANOMETRY (EM);  Surgeon:  Shellia Cleverly, DO;  [facility];  Service: Gastroenterology;  Laterality: N/A;   GALLBLADDER SURGERY  2015   JOINT REPLACEMENT     RT TOTAL HIP / RT TOTAL KNEE   MASTECTOMY  1998   BILATERAL    [place] IMPEDANCE STUDY  06/21/2018   Procedure: [place] IMPEDANCE STUDY;  Surgeon: Shellia Cleverly, DO;  [facility];  Service: Gastroenterology;;   SKIN CANCER EXCISION  2016   RT SIDE OF NOSE   TONSILLECTOMY  1953   TOTAL HIP ARTHROPLASTY  2010   RIGHT   TOTAL HIP ARTHROPLASTY Left 05/09/2015   Procedure: LEFT TOTAL HIP ARTHROPLASTY ANTERIOR APPROACH;  Surgeon: Ollen Gross, MD;  [facility];  Service: Orthopedics;  Laterality: Left;   TOTAL KNEE ARTHROPLASTY  2001   TUMOR REMOVAL  2012   ABDOMINAL - NON CANCEROUS    There were no vitals filed for this visit.    Subjective Assessment - 05/28/20 1846     Subjective Pt using rollator for assistance with ambulation - states she wants a scooter to use for mobility outside her home- states she does not want a power wheelchair    Patient Stated Goals obtain a scooter for mobility outside her home    Currently in Pain?  Yes    Pain Score 8     Pain Location Generalized    Pain Orientation Right;Left    Pain Descriptors / Indicators Aching;Discomfort    Pain Type Chronic pain                [facility] PT Assessment - 05/28/20 0001       Assessment   Medical Diagnosis Lumbar post-laminectomy syndrome; chronic pain syndrome:  h/o Lt THR    Referring Provider (PT) Danella Sensing, NP    Onset Date/Surgical Date --   Referral dated 03-20-20               LMN to be completed when quote has been received from vendor.  Plan to obtain scooter from [employer]        Objective measurements completed on examination: See above findings.                            Plan - 05/28/20 1858     Clinical Impression Statement Pt evaluated for motorized scooter (states she does not want a power  wheelchair); vendor Deberah Pelton, [employer] with [employer] present for eval; pt has chronic pain syndrome and is not able to amb. for prolonged distances nor able to stand for prolonged periods of time    Personal Factors and Comorbidities Comorbidity 2;Fitness;Time since onset of injury/illness/exacerbation    Comorbidities h/o Lt THR, post laminectomy syndrome, chronic pain syndrome    Examination-Activity Limitations Locomotion Level;Transfers;Squat;Stand;Stairs    Examination-Participation Restrictions Meal Prep;Cleaning;Community Activity;Shop    Stability/Clinical Decision Making Evolving/Moderate complexity    Clinical Decision Making Moderate    Rehab Potential --   eval only   PT Frequency One time visit    PT Treatment/Interventions Other (comment)   wheelchair management   Recommended Other Services obtain scooter from [employer]             Patient will benefit from skilled therapeutic intervention in order to improve the following deficits and impairments:  Difficulty walking,Pain,Decreased balance,Decreased strength  Visit Diagnosis: Chronic bilateral low back pain without sciatica - Plan: PT plan of care cert/re-cert  Difficulty in walking, not elsewhere classified - Plan: PT plan of care cert/re-cert  Unsteadiness on feet - Plan: PT plan of care cert/re-cert     Problem List Patient Active Problem List   Diagnosis Date Noted   Constipation    Encounter for orthopedic follow-up care 06/18/2019   Urinary tract infection 04/20/2019   Acute pain of right wrist 03/28/2019   Tenosynovitis, wrist 03/28/2019   Acquired trigger finger of right little finger 01/15/2019   Carpal tunnel syndrome of right wrist 01/15/2019   Aftercare 08/17/2018   Lumbar post-laminectomy syndrome 07/31/2018   Heartburn    Fracture of superior pubic ramus ([place]) 06/16/2018   Radial styloid tenosynovitis of left hand 05/16/2018   Pain of left hand 04/20/2018   Osteopenia 04/15/2018   Dyslipidemia  02/02/2018   Gastroesophageal reflux disease 02/02/2018   Trochanteric bursitis of left hip 12/23/2017   Primary osteoarthritis of left knee 10/17/2017   History of right knee joint replacement 10/17/2017   Pain in both lower extremities 08/02/2017   Stage 1 breast cancer, ER+, right ([place]) 07/04/2017   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 [place], [place], [address] Phone: [phone]   Fax:  [phone]  Name: Crystal Lee MRN: [id number] Date of Birth: 09-Oct-1933

## 2020-06-06 DIAGNOSIS — H1032 Unspecified acute conjunctivitis, left eye: Secondary | ICD-10-CM | POA: Diagnosis not present

## 2020-06-08 ENCOUNTER — Inpatient Hospital Stay (HOSPITAL_BASED_OUTPATIENT_CLINIC_OR_DEPARTMENT_OTHER)
Admission: EM | Admit: 2020-06-08 | Discharge: 2020-06-18 | DRG: 330 | Disposition: A | Discharge status: Home / Self Care | Payer: Medicare Other | Attending: Internal Medicine | Admitting: Internal Medicine

## 2020-06-08 ENCOUNTER — Other Ambulatory Visit: Payer: Self-pay

## 2020-06-08 ENCOUNTER — Emergency Department (HOSPITAL_BASED_OUTPATIENT_CLINIC_OR_DEPARTMENT_OTHER): Payer: Medicare Other

## 2020-06-08 ENCOUNTER — Observation Stay (HOSPITAL_COMMUNITY): Payer: Medicare Other

## 2020-06-08 ENCOUNTER — Encounter (HOSPITAL_BASED_OUTPATIENT_CLINIC_OR_DEPARTMENT_OTHER): Payer: Self-pay

## 2020-06-08 DIAGNOSIS — K922 Gastrointestinal hemorrhage, unspecified: Secondary | ICD-10-CM

## 2020-06-08 DIAGNOSIS — D696 Thrombocytopenia, unspecified: Secondary | ICD-10-CM | POA: Diagnosis present

## 2020-06-08 DIAGNOSIS — Z85820 Personal history of malignant melanoma of skin: Secondary | ICD-10-CM

## 2020-06-08 DIAGNOSIS — D125 Benign neoplasm of sigmoid colon: Secondary | ICD-10-CM

## 2020-06-08 DIAGNOSIS — K219 Gastro-esophageal reflux disease without esophagitis: Secondary | ICD-10-CM

## 2020-06-08 DIAGNOSIS — C186 Malignant neoplasm of descending colon: Secondary | ICD-10-CM | POA: Diagnosis not present

## 2020-06-08 DIAGNOSIS — Z9013 Acquired absence of bilateral breasts and nipples: Secondary | ICD-10-CM

## 2020-06-08 DIAGNOSIS — R0989 Other specified symptoms and signs involving the circulatory and respiratory systems: Secondary | ICD-10-CM | POA: Diagnosis not present

## 2020-06-08 DIAGNOSIS — K5730 Diverticulosis of large intestine without perforation or abscess without bleeding: Secondary | ICD-10-CM

## 2020-06-08 DIAGNOSIS — Z79899 Other long term (current) drug therapy: Secondary | ICD-10-CM

## 2020-06-08 DIAGNOSIS — Z9841 Cataract extraction status, right eye: Secondary | ICD-10-CM

## 2020-06-08 DIAGNOSIS — K760 Fatty (change of) liver, not elsewhere classified: Secondary | ICD-10-CM | POA: Diagnosis not present

## 2020-06-08 DIAGNOSIS — Z885 Allergy status to narcotic agent status: Secondary | ICD-10-CM

## 2020-06-08 DIAGNOSIS — K6389 Other specified diseases of intestine: Secondary | ICD-10-CM

## 2020-06-08 DIAGNOSIS — I10 Essential (primary) hypertension: Secondary | ICD-10-CM | POA: Diagnosis not present

## 2020-06-08 DIAGNOSIS — Z86010 Personal history of colonic polyps: Secondary | ICD-10-CM

## 2020-06-08 DIAGNOSIS — M1990 Unspecified osteoarthritis, unspecified site: Secondary | ICD-10-CM | POA: Diagnosis present

## 2020-06-08 DIAGNOSIS — K317 Polyp of stomach and duodenum: Secondary | ICD-10-CM

## 2020-06-08 DIAGNOSIS — D62 Acute posthemorrhagic anemia: Secondary | ICD-10-CM

## 2020-06-08 DIAGNOSIS — Z87891 Personal history of nicotine dependence: Secondary | ICD-10-CM

## 2020-06-08 DIAGNOSIS — Z853 Personal history of malignant neoplasm of breast: Secondary | ICD-10-CM | POA: Diagnosis not present

## 2020-06-08 DIAGNOSIS — I110 Hypertensive heart disease with heart failure: Secondary | ICD-10-CM | POA: Diagnosis not present

## 2020-06-08 DIAGNOSIS — Z90710 Acquired absence of both cervix and uterus: Secondary | ICD-10-CM

## 2020-06-08 DIAGNOSIS — Z96651 Presence of right artificial knee joint: Secondary | ICD-10-CM | POA: Diagnosis present

## 2020-06-08 DIAGNOSIS — K625 Hemorrhage of anus and rectum: Secondary | ICD-10-CM

## 2020-06-08 DIAGNOSIS — I701 Atherosclerosis of renal artery: Secondary | ICD-10-CM | POA: Diagnosis not present

## 2020-06-08 DIAGNOSIS — Z96643 Presence of artificial hip joint, bilateral: Secondary | ICD-10-CM | POA: Diagnosis present

## 2020-06-08 DIAGNOSIS — C772 Secondary and unspecified malignant neoplasm of intra-abdominal lymph nodes: Secondary | ICD-10-CM | POA: Diagnosis present

## 2020-06-08 DIAGNOSIS — I700 Atherosclerosis of aorta: Secondary | ICD-10-CM | POA: Diagnosis not present

## 2020-06-08 DIAGNOSIS — G4730 Sleep apnea, unspecified: Secondary | ICD-10-CM | POA: Diagnosis present

## 2020-06-08 DIAGNOSIS — Z20822 Contact with and (suspected) exposure to covid-19: Secondary | ICD-10-CM | POA: Diagnosis not present

## 2020-06-08 DIAGNOSIS — Z9842 Cataract extraction status, left eye: Secondary | ICD-10-CM | POA: Diagnosis not present

## 2020-06-08 DIAGNOSIS — G629 Polyneuropathy, unspecified: Secondary | ICD-10-CM | POA: Diagnosis present

## 2020-06-08 DIAGNOSIS — E785 Hyperlipidemia, unspecified: Secondary | ICD-10-CM | POA: Diagnosis present

## 2020-06-08 DIAGNOSIS — E876 Hypokalemia: Secondary | ICD-10-CM | POA: Diagnosis present

## 2020-06-08 DIAGNOSIS — I5032 Chronic diastolic (congestive) heart failure: Secondary | ICD-10-CM | POA: Diagnosis present

## 2020-06-08 DIAGNOSIS — L719 Rosacea, unspecified: Secondary | ICD-10-CM | POA: Diagnosis present

## 2020-06-08 DIAGNOSIS — M818 Other osteoporosis without current pathological fracture: Secondary | ICD-10-CM | POA: Diagnosis present

## 2020-06-08 DIAGNOSIS — Z9109 Other allergy status, other than to drugs and biological substances: Secondary | ICD-10-CM

## 2020-06-08 HISTORY — DX: Essential (primary) hypertension: I10

## 2020-06-08 HISTORY — DX: Acute posthemorrhagic anemia: D62

## 2020-06-08 HISTORY — DX: Gastrointestinal hemorrhage, unspecified: K92.2

## 2020-06-08 LAB — COMPREHENSIVE METABOLIC PANEL
ALT: 25 U/L (ref 0–44)
AST: 37 U/L (ref 15–41)
Albumin: 3.8 g/dL (ref 3.5–5.0)
Alkaline Phosphatase: 71 U/L (ref 38–126)
Anion gap: 10 (ref 5–15)
BUN: 14 mg/dL (ref 8–23)
CO2: 23 mmol/L (ref 22–32)
Calcium: 9 mg/dL (ref 8.9–10.3)
Chloride: 104 mmol/L (ref 98–111)
Creatinine, Ser: 0.61 mg/dL (ref 0.44–1.00)
GFR, Estimated: >60 mL/min (ref >60)
Glucose, Bld: 114 mg/dL — ABNORMAL HIGH (ref 70–99)
Potassium: 4.2 mmol/L (ref 3.5–5.1)
Sodium: 137 mmol/L (ref 135–145)
Total Bilirubin: 0.4 mg/dL (ref 0.3–1.2)
Total Protein: 7.2 g/dL (ref 6.5–8.1)

## 2020-06-08 LAB — URINALYSIS, MICROSCOPIC (REFLEX): RBC / HPF: NONE SEEN RBC/hpf (ref 0–5)

## 2020-06-08 LAB — URINALYSIS, ROUTINE W REFLEX MICROSCOPIC
Bilirubin Urine: NEGATIVE
Glucose, UA: NEGATIVE mg/dL
Hgb urine dipstick: NEGATIVE
Ketones, ur: NEGATIVE mg/dL
Nitrite: NEGATIVE
Protein, ur: NEGATIVE mg/dL
Specific Gravity, Urine: 1.02 (ref 1.005–1.030)
pH: 6 (ref 5.0–8.0)

## 2020-06-08 LAB — CBC
HCT: 36.4 % (ref 36.0–46.0)
HCT: 33 % — ABNORMAL LOW (ref 36.0–46.0)
Hemoglobin: 11.8 g/dL — ABNORMAL LOW (ref 12.0–15.0)
Hemoglobin: 10.8 g/dL — ABNORMAL LOW (ref 12.0–15.0)
MCH: 29 pg (ref 26.0–34.0)
MCH: 29.3 pg (ref 26.0–34.0)
MCHC: 32.4 g/dL (ref 30.0–36.0)
MCHC: 32.7 g/dL (ref 30.0–36.0)
MCV: 89.4 fL (ref 80.0–100.0)
MCV: 89.4 fL (ref 80.0–100.0)
Platelets: 161 10*3/uL (ref 150–400)
Platelets: 148 10*3/uL — ABNORMAL LOW (ref 150–400)
RBC: 4.07 MIL/uL (ref 3.87–5.11)
RBC: 3.69 MIL/uL — ABNORMAL LOW (ref 3.87–5.11)
RDW: 12.9 % (ref 11.5–15.5)
RDW: 12.9 % (ref 11.5–15.5)
WBC: 7.4 10*3/uL (ref 4.0–10.5)
WBC: 7 10*3/uL (ref 4.0–10.5)
nRBC: 0 % (ref 0.0–0.2)
nRBC: 0 % (ref 0.0–0.2)

## 2020-06-08 LAB — RESP PANEL BY RT-PCR (FLU A&B, COVID) ARPGX2
Influenza A by PCR: NEGATIVE
Influenza B by PCR: NEGATIVE
SARS Coronavirus 2 by RT PCR: NEGATIVE

## 2020-06-08 LAB — OCCULT BLOOD X 1 CARD TO LAB, STOOL: Fecal Occult Bld: POSITIVE — AB

## 2020-06-08 MED ORDER — SODIUM CHLORIDE 0.9 % IV SOLN: INTRAVENOUS | Status: DC

## 2020-06-08 MED ORDER — IOHEXOL 350 MG/ML SOLN
100.0000 mL | Freq: Once | INTRAVENOUS | Status: AC | PRN
  Administered 2020-06-08: 100 mL via INTRAVENOUS

## 2020-06-08 MED ORDER — ACETAMINOPHEN 650 MG RE SUPP: 650.0000 mg | Freq: Four times a day (QID) | RECTAL | Status: DC | PRN

## 2020-06-08 MED ORDER — HYDROCODONE-ACETAMINOPHEN 10-325 MG PO TABS
1.0000 | ORAL_TABLET | Freq: Once | ORAL | Status: AC
  Administered 2020-06-08: 1 via ORAL
  Filled 2020-06-08: qty 1

## 2020-06-08 MED ORDER — HYDROCODONE-ACETAMINOPHEN 10-325 MG PO TABS: 1.0000 | ORAL_TABLET | Freq: Four times a day (QID) | ORAL | Status: DC | PRN

## 2020-06-08 MED ORDER — PANTOPRAZOLE SODIUM 40 MG IV SOLR
40.0000 mg | INTRAVENOUS | Status: DC
  Administered 2020-06-08 – 2020-06-13 (×6): 40 mg via INTRAVENOUS
  Filled 2020-06-08 (×6): qty 40

## 2020-06-08 MED ORDER — ACETAMINOPHEN 325 MG PO TABS
650.0000 mg | ORAL_TABLET | Freq: Four times a day (QID) | ORAL | Status: DC | PRN
  Administered 2020-06-09: 650 mg via ORAL
  Filled 2020-06-08: qty 2

## 2020-06-08 NOTE — ED Notes (Signed)
Patient transported to CT 

## 2020-06-08 NOTE — H&P (Addendum)
History and Physical    Emmery Seiler KDT:[id number] DOB: 12/30/1933 DOA: 06/08/2020  PCP: Darreld Mclean, MD Patient coming from: [place]  Chief Complaint: Rectal bleeding  HPI: Crystal Lee is a 87 y.o. female with medical history significant of hypertension, hyperlipidemia, history of stage I breast cancer in remission, history of melanoma, GERD, osteoporosis, colon polyps presented to the ED with complaints of bright red blood per rectum.  Patient states last night after she had a bowel movement when she wiped she noticed a lot of blood on the tissue.  When she looked into the toilet bowl it was full of bright red blood.  This has not happened to her before.  States since then she has continued to notice bright red blood from her rectum.  She has had some mild lower abdominal discomfort.  She has not vomited any blood.  She does not take aspirin or any other blood thinner at home.  She does not take Motrin, ibuprofen, or any NSAIDs as they upset her stomach.  Denies fevers, chills, cough, shortness of breath, or chest pain.  Reports chronic urinary urgency but denies dysuria.  ED Course: Slightly tachycardic but not hypotensive.  Maroon-colored stool noted on exam.  FOBT positive.  WBC 7.4, hemoglobin 11.8 (was above 13 two months ago), platelet count 161K.  Sodium 137, potassium 4.2, chloride 104, bicarb 23, BUN 14, creatinine 0.6, glucose 114.  Screening SARS-CoV-2 PCR test and influenza panel both negative.  UA with negative nitrite, trace amount of leukocytes, 6-10 WBCs, and many bacteria.  ED provider discussed the case with Dr. Hilarie Fredrickson from [facility] who recommended admission and CT angiogram of abdomen and pelvis.  CT angiogram of abdomen and pelvis showing a focal area of inflammation or infiltrative neoplasm involving the distal transverse colon.  Further evaluation with colonoscopy is recommended.  Sigmoid diverticulosis.  Review of Systems:  All systems reviewed and  apart from history of presenting illness, are negative.  Past Medical History:  Diagnosis Date  . Arthritis   . Cancer (HCC)    HX BREAST CANCER/ SKIN CANCER  . Complication of anesthesia    N/V WITH MORPHINE  . Difficulty sleeping   . Fractured hip ([place])    LEFT - AUG 2016  . GERD (gastroesophageal reflux disease)   . Hyperlipidemia   . Hypertension   . Melanoma ([place])   . Neuropathy   . Nocturia   . Osteopenia   . 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 PONV (postoperative nausea and vomiting)    PT STATES MORPHINE CAUSED N/V  . Rosacea   . Sleep apnea   . Stage 1 breast cancer, ER+, right ([place]) 07/04/2017    Past Surgical History:  Procedure Laterality Date  . [place] STUDY N/A 06/21/2018   Procedure: [place] STUDY;  Surgeon: Lavena Bullion, DO;  Location: [facility];  Service: Gastroenterology;  Laterality: N/A;  with impedance  . ABDOMINAL HYSTERECTOMY  2010  . ANAL RECTAL MANOMETRY N/A 09/19/2019   Procedure: ANO RECTAL MANOMETRY;  Surgeon: Mauri Pole, MD;  Location: [facility];  Service: Endoscopy;  Laterality: N/A;  . APPENDECTOMY  1949  . [place]  . BREAST SURGERY    . CATARACT EXTRACTION Bilateral 2011  . CHOLECYSTECTOMY    . ESOPHAGEAL MANOMETRY N/A 06/21/2018   Procedure: ESOPHAGEAL MANOMETRY (EM);  Surgeon: Lavena Bullion, DO;  Location: [facility];  Service: Gastroenterology;  Laterality: N/A;  .  GALLBLADDER SURGERY  2015  . JOINT REPLACEMENT     RT TOTAL HIP / RT TOTAL KNEE  . MASTECTOMY  1998   BILATERAL   . [place] IMPEDANCE STUDY  06/21/2018   Procedure: [place] IMPEDANCE STUDY;  Surgeon: Lavena Bullion, DO;  Location: [facility];  Service: Gastroenterology;;  . SKIN CANCER EXCISION  2016   RT SIDE OF NOSE  . TONSILLECTOMY  1953  . TOTAL HIP ARTHROPLASTY  2010   RIGHT  . TOTAL HIP ARTHROPLASTY Left 05/09/2015   Procedure: LEFT TOTAL HIP ARTHROPLASTY ANTERIOR APPROACH;  Surgeon: Gaynelle Arabian, MD;   Location: [facility];  Service: Orthopedics;  Laterality: Left;  . TOTAL KNEE ARTHROPLASTY  2001  . TUMOR REMOVAL  2012   ABDOMINAL - NON CANCEROUS     reports that she quit smoking about 44 years ago. She has a 10.00 pack-year smoking history. She has never used smokeless tobacco. She reports that she does not drink alcohol and does not use drugs.  Allergies  Allergen Reactions  . Adhesive [Tape]   . Denture Adhesive   . Morphine And Related Nausea And Vomiting    Family History  Problem Relation Age of Onset  . Other Mother        complications from flu  . Other Father        unsure of cause  . Colon cancer Neg Hx     Prior to Admission medications   Medication Sig Start Date End Date Taking? Authorizing Provider  CALCIUM PO Take 1,200 mg by mouth daily.    [provider]  cyclobenzaprine (FLEXERIL) 5 MG tablet TAKE ONE TABLET BY MOUTH AT BEDTIME AS NEEDED FOR MUSCLE SPASMS 09/18/19   Bayard Hugger, NP  doxycycline (VIBRAMYCIN) 100 MG capsule Take by mouth. 05/19/20   [provider]  ergocalciferol (VITAMIN D2) 1.25 MG (50000 UT) capsule Take 1 capsule (50,000 Units total) by mouth once a week. 12/05/19   [name], MD  fenofibrate (TRICOR) 48 MG tablet Take 1 tablet (48 mg total) by mouth daily. 01/28/20   Copland, Gay Filler, MD  fluorouracil (EFUDEX) 5 % cream fluorouracil 5 % topical cream    [provider]  furosemide (LASIX) 20 MG tablet Take 1 tablet (20 mg total) by mouth daily. 07/13/19 02/25/20  Richardo Priest, MD  HYDROcodone-acetaminophen (NORCO) 10-325 MG tablet Take 1 tablet by mouth every 6 (six) hours as needed. 05/20/20   Bayard Hugger, NP  ketoconazole (NIZORAL) 2 % cream SMARTSIG:1 Application Topical 1 to 2 Times Daily 12/06/19   [provider]  losartan (COZAAR) 25 MG tablet Take 1 tablet (25 mg total) by mouth daily. 07/11/19   Copland, Gay Filler, MD  metroNIDAZOLE (METROGEL) 1 % gel Apply topically daily. Use as  needed for rosacea 07/11/19   Copland, Gay Filler, MD  neomycin-polymyxin b-dexamethasone (MAXITROL) 3.5-10000-0.1 SUSP  05/19/20   [provider]  ondansetron (ZOFRAN-ODT) 4 MG disintegrating tablet as needed. 01/01/17   [provider]  potassium chloride (KLOR-CON) 10 MEQ tablet Take 1 tablet (10 mEq total) by mouth daily. 12/11/19 03/10/20  Richardo Priest, MD  prednisoLONE acetate (PRED FORTE) 1 % ophthalmic suspension  05/19/20   [provider]    Physical Exam: Vitals:   06/08/20 1355 06/08/20 1518 06/08/20 1829 06/08/20 1935  BP: (!) 144/79 131/72 140/62 (!) 141/46  Pulse: (!) 119 (!) 108 78 80  Resp: 19 (!) 25 (!) 25 20  Temp:  98.3 F (36.8 C) 97.8 F (36.6 C)  TempSrc:   Oral Oral  SpO2: 100% 100% 97% 97%  Weight:    99.9 kg  Height:    5\' 6"  (1.676 m)    Physical Exam Constitutional:      General: She is not in acute distress. HENT:     Head: Normocephalic and atraumatic.  Eyes:     Extraocular Movements: Extraocular movements intact.  Cardiovascular:     Rate and Rhythm: Normal rate and regular rhythm.     Pulses: Normal pulses.  Pulmonary:     Effort: Pulmonary effort is normal. No respiratory distress.     Breath sounds: Rales present. No wheezing.     Comments: Mild bibasilar rales Abdominal:     General: Bowel sounds are normal. There is no distension.     Palpations: Abdomen is soft.     Tenderness: There is no abdominal tenderness. There is no guarding or rebound.  Musculoskeletal:        General: No swelling or tenderness.     Cervical back: Normal range of motion and neck supple.  Skin:    General: Skin is warm and dry.  Neurological:     General: No focal deficit present.     Mental Status: She is alert and oriented to person, place, and time.     Labs on Admission: I have personally reviewed following labs and imaging studies  CBC: Recent Labs  Lab 06/08/20 1211 06/08/20 1717  WBC 7.4 7.0  HGB 11.8* 10.8*  HCT  36.4 33.0*  MCV 89.4 89.4  PLT 161 245*   Basic Metabolic Panel: Recent Labs  Lab 06/08/20 1211  NA 137  K 4.2  CL 104  CO2 23  GLUCOSE 114*  BUN 14  CREATININE 0.61  CALCIUM 9.0   GFR: Estimated Creatinine Clearance: 62.4 mL/min (by C-G formula based on SCr of 0.61 mg/dL). Liver Function Tests: Recent Labs  Lab 06/08/20 1211  AST 37  ALT 25  ALKPHOS 71  BILITOT 0.4  PROT 7.2  ALBUMIN 3.8   No results for input(s): LIPASE, AMYLASE in the last 168 hours. No results for input(s): AMMONIA in the last 168 hours. Coagulation Profile: No results for input(s): INR, PROTIME in the last 168 hours. Cardiac Enzymes: No results for input(s): CKTOTAL, CKMB, CKMBINDEX, TROPONINI in the last 168 hours. BNP (last 3 results) Recent Labs    07/13/19 1019 02/25/20 1627  PROBNP 74 90   HbA1C: No results for input(s): HGBA1C in the last 72 hours. CBG: No results for input(s): GLUCAP in the last 168 hours. Lipid Profile: No results for input(s): CHOL, HDL, LDLCALC, TRIG, CHOLHDL, LDLDIRECT in the last 72 hours. Thyroid Function Tests: No results for input(s): TSH, T4TOTAL, FREET4, T3FREE, THYROIDAB in the last 72 hours. Anemia Panel: No results for input(s): VITAMINB12, FOLATE, FERRITIN, TIBC, IRON, RETICCTPCT in the last 72 hours. Urine analysis:    Component Value Date/Time   COLORURINE YELLOW 06/08/2020 [place] 06/08/2020 1653   LABSPEC 1.020 06/08/2020 1653   PHURINE 6.0 06/08/2020 1653   GLUCOSEU NEGATIVE 06/08/2020 1653   GLUCOSEU NEGATIVE 04/20/2019 1017   HGBUR NEGATIVE 06/08/2020 [place] 06/08/2020 1653   BILIRUBINUR negative 06/29/2019 [place] 06/08/2020 1653   PROTEINUR NEGATIVE 06/08/2020 1653   UROBILINOGEN 0.2 06/29/2019 1615   UROBILINOGEN 2.0 (A) 04/20/2019 1017   NITRITE NEGATIVE 06/08/2020 1653   LEUKOCYTESUR TRACE (A) 06/08/2020 1653  Radiological Exams on Admission: No results  found.  EKG: Independently reviewed.  Sinus rhythm.  Assessment/Plan Principal Problem:   Lower GI bleed Active Problems:   GERD (gastroesophageal reflux disease)   Acute blood loss anemia   HTN (hypertension)   Acute blood loss anemia secondary to suspected acute lower GI bleed: Patient is presenting with complaints of bright red blood per rectum.  She is not on any antiplatelet agents or anticoagulation.  Has maroon-colored stool on exam.  FOBT positive.  Hemoglobin 11.8, was above 13 two months ago.  Colonoscopy done in 2017 revealed 3 sessile polyps (tubular adenoma) in the descending and transverse colon which were removed. CT angiogram of abdomen and pelvis done today showing a focal area of inflammation or infiltrative neoplasm involving the distal transverse colon.  Further evaluation with colonoscopy is recommended.  Sigmoid diverticulosis.  Currently hemodynamically stable.  Repeat labs showing hemoglobin 10.8. -Type and screen, serial H&H.  Transfuse if there is a significant drop in hemoglobin on repeat labs or symptomatic.  Check anemia panel.  I spoke to Dr. Rush Landmark, GI will consult in the morning.  He is recommending clear liquid diet for now. -Has mild bibasilar rales on exam, no hypoxia or signs of respiratory distress. CXR ordered, it no signs of volume overload, continue IVF.   Abnormal urinalysis: UA with negative nitrite, trace amount of leukocytes, 6-10 WBCs, and many bacteria.  Patient reports chronic urinary urgency but denies dysuria. -Hold antibiotics at this time given no fever or leukocytosis.  Treat if urine culture is suggestive of UTI.  Hypertension -Hold antihypertensives at this time.  GERD -Continue PPI  DVT prophylaxis: SCDs Code Status: Patient wishes to be full code. Family Communication: No family available at this time. Disposition Plan: Status is: Observation  The patient remains OBS appropriate and will d/c before 2 midnights.  Dispo: The  patient is from: Home              Anticipated d/c is to: Home              Anticipated d/c date is: 2 days              Patient currently is not medically stable to d/c.  The medical decision making on this patient was of high complexity and the patient is at high risk for clinical deterioration, therefore this is a level 3 visit.  Shela Leff MD [facility]  If 7PM-7AM, please contact night-coverage [email or web address]  06/08/2020, 8:21 PM

## 2020-06-08 NOTE — Care Plan (Signed)
Was called by Dr. Ronnald Nian, EDP at bedside at [facility] to admit patient [facility], 87 year old lady history of hypertension, history of sessile polyps, hyperlipidemia, history of melanoma, history of stage I breast cancer presented to the ED with bright red blood per rectum.  Per EDP and noted maroon stools on presentation.  Patient also noted in the room wiping and blood still noted.  Hemoglobin on presentation.  Hemoglobin was 13.3 04/07/2020.  Patient noted to be hemodynamically stable.  Patient's primary gastroenterologist is Dr. Bryan Lemma of [facility].  EDP stated spoke with Dr. Luis Abed GI who advised of admission, CT angiogram of abdomen and pelvis.  Per GI they will consult when patient arrives. Patient accepted to telemetry bed at [facility].  COVID-19 PCR pending.  No charge.

## 2020-06-08 NOTE — Plan of Care (Signed)
  Problem: Education: Goal: Knowledge of condition and prescribed therapy will improve Outcome: Progressing   Problem: Physical Regulation: Goal: Complications related to the disease process, condition or treatment will be avoided or minimized Outcome: Progressing   

## 2020-06-08 NOTE — ED Notes (Signed)
Pt endorses  Dark rectal bleeding that started last night. Pt Wearing pad, changing  Every 2 hours.

## 2020-06-08 NOTE — ED Notes (Signed)
Hemo occult card at bedside

## 2020-06-08 NOTE — ED Notes (Signed)
Pt changing to gown

## 2020-06-08 NOTE — ED Notes (Signed)
Spoke with patients daughter Almyra Free, informed that pt being transferred to [facility]

## 2020-06-08 NOTE — ED Notes (Signed)
Pt requested washcloths to clean self

## 2020-06-08 NOTE — ED Notes (Signed)
Pt lying flat for orthostatics 

## 2020-06-08 NOTE — ED Provider Notes (Signed)
[facility] EMERGENCY DEPARTMENT Provider Note   CSN: [id number] Arrival date & time: 06/08/20  1058     History Chief Complaint  Patient presents with  . Rectal Bleeding    Crystal Lee is a 87 y.o. female.  The history is provided by the patient.  Rectal Bleeding Quality:  Bright red Amount:  Moderate Timing:  Intermittent Chronicity:  New Context comment:  Polyps Relieved by:  Nothing Worsened by:  Nothing Associated symptoms: no abdominal pain, no dizziness, no fever, no loss of consciousness and no vomiting        Past Medical History:  Diagnosis Date  . Arthritis   . Cancer (HCC)    HX BREAST CANCER/ SKIN CANCER  . Complication of anesthesia    N/V WITH MORPHINE  . Difficulty sleeping   . Fractured hip ([place])    LEFT - AUG 2016  . GERD (gastroesophageal reflux disease)   . Hyperlipidemia   . Hypertension   . Melanoma ([place])   . Neuropathy   . Nocturia   . Osteopenia   . 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 PONV (postoperative nausea and vomiting)    PT STATES MORPHINE CAUSED N/V  . Rosacea   . Sleep apnea   . Stage 1 breast cancer, ER+, right ([place]) 07/04/2017    Patient Active Problem List   Diagnosis Date Noted  . BRBPR (bright red blood per rectum) 06/08/2020  . Constipation   . Encounter for orthopedic follow-up care 06/18/2019  . Urinary tract infection 04/20/2019  . Acute pain of right wrist 03/28/2019  . Tenosynovitis, wrist 03/28/2019  . Acquired trigger finger of right little finger 01/15/2019  . Carpal tunnel syndrome of right wrist 01/15/2019  . Aftercare 08/17/2018  . Lumbar post-laminectomy syndrome 07/31/2018  . Heartburn   . Fracture of superior pubic ramus ([place]) 06/16/2018  . Radial styloid tenosynovitis of left hand 05/16/2018  . Pain of left hand 04/20/2018  . Osteopenia 04/15/2018  . Dyslipidemia 02/02/2018  . Gastroesophageal reflux disease 02/02/2018  . Trochanteric bursitis of left hip 12/23/2017   . Primary osteoarthritis of left knee 10/17/2017  . History of right knee joint replacement 10/17/2017  . Pain in both lower extremities 08/02/2017  . Stage 1 breast cancer, ER+, right ([place]) 07/04/2017  . 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 Pain in right hand 06/10/2017  . Trigger finger of right hand 06/10/2017  . Paresthesia 02/09/2017  . Low back pain 02/09/2017  . Gait abnormality 02/09/2017  . OA (osteoarthritis) of hip 05/09/2015    Past Surgical History:  Procedure Laterality Date  . [place] STUDY N/A 06/21/2018   Procedure: [place] STUDY;  Surgeon: Lavena Bullion, DO;  Location: [facility];  Service: Gastroenterology;  Laterality: N/A;  with impedance  . ABDOMINAL HYSTERECTOMY  2010  . ANAL RECTAL MANOMETRY N/A 09/19/2019   Procedure: ANO RECTAL MANOMETRY;  Surgeon: Mauri Pole, MD;  Location: [facility];  Service: Endoscopy;  Laterality: N/A;  . APPENDECTOMY  1949  . [place]  . BREAST SURGERY    . CATARACT EXTRACTION Bilateral 2011  . CHOLECYSTECTOMY    . ESOPHAGEAL MANOMETRY N/A 06/21/2018   Procedure: ESOPHAGEAL MANOMETRY (EM);  Surgeon: Lavena Bullion, DO;  Location: [facility];  Service: Gastroenterology;  Laterality: N/A;  . GALLBLADDER SURGERY  2015  . JOINT REPLACEMENT     RT TOTAL HIP / RT TOTAL KNEE  . MASTECTOMY  1998  BILATERAL   . [place] IMPEDANCE STUDY  06/21/2018   Procedure: [place] IMPEDANCE STUDY;  Surgeon: Lavena Bullion, DO;  Location: [facility];  Service: Gastroenterology;;  . SKIN CANCER EXCISION  2016   RT SIDE OF NOSE  . TONSILLECTOMY  1953  . TOTAL HIP ARTHROPLASTY  2010   RIGHT  . TOTAL HIP ARTHROPLASTY Left 05/09/2015   Procedure: LEFT TOTAL HIP ARTHROPLASTY ANTERIOR APPROACH;  Surgeon: Gaynelle Arabian, MD;  Location: [facility];  Service: Orthopedics;  Laterality: Left;  . TOTAL KNEE ARTHROPLASTY  2001  . TUMOR REMOVAL  2012   ABDOMINAL - NON CANCEROUS     OB History   No obstetric  history on file.     Family History  Problem Relation Age of Onset  . Other Mother        complications from flu  . Other Father        unsure of cause  . Colon cancer Neg Hx     Social History   Tobacco Use  . Smoking status: Former Smoker    Packs/day: 0.50    Years: 20.00    Pack years: 10.00    Quit date: 02/03/1976    Years since quitting: 44.3  . Smokeless tobacco: Never Used  Vaping Use  . Vaping Use: Never used  Substance Use Topics  . Alcohol use: No  . Drug use: No    Home Medications Prior to Admission medications   Medication Sig Start Date End Date Taking? Authorizing Provider  CALCIUM PO Take 1,200 mg by mouth daily.    [provider]  cyclobenzaprine (FLEXERIL) 5 MG tablet TAKE ONE TABLET BY MOUTH AT BEDTIME AS NEEDED FOR MUSCLE SPASMS 09/18/19   Bayard Hugger, NP  doxycycline (VIBRAMYCIN) 100 MG capsule Take by mouth. 05/19/20   [provider]  ergocalciferol (VITAMIN D2) 1.25 MG (50000 UT) capsule Take 1 capsule (50,000 Units total) by mouth once a week. 12/05/19   [name], MD  fenofibrate (TRICOR) 48 MG tablet Take 1 tablet (48 mg total) by mouth daily. 01/28/20   Copland, Gay Filler, MD  fluorouracil (EFUDEX) 5 % cream fluorouracil 5 % topical cream    [provider]  furosemide (LASIX) 20 MG tablet Take 1 tablet (20 mg total) by mouth daily. 07/13/19 02/25/20  Richardo Priest, MD  HYDROcodone-acetaminophen (NORCO) 10-325 MG tablet Take 1 tablet by mouth every 6 (six) hours as needed. 05/20/20   Bayard Hugger, NP  ketoconazole (NIZORAL) 2 % cream SMARTSIG:1 Application Topical 1 to 2 Times Daily 12/06/19   [provider]  losartan (COZAAR) 25 MG tablet Take 1 tablet (25 mg total) by mouth daily. 07/11/19   Copland, Gay Filler, MD  metroNIDAZOLE (METROGEL) 1 % gel Apply topically daily. Use as needed for rosacea 07/11/19   Copland, Gay Filler, MD  neomycin-polymyxin b-dexamethasone (MAXITROL) 3.5-10000-0.1 SUSP   05/19/20   [provider]  ondansetron (ZOFRAN-ODT) 4 MG disintegrating tablet as needed. 01/01/17   [provider]  potassium chloride (KLOR-CON) 10 MEQ tablet Take 1 tablet (10 mEq total) by mouth daily. 12/11/19 03/10/20  Richardo Priest, MD  prednisoLONE acetate (PRED FORTE) 1 % ophthalmic suspension  05/19/20   [provider]    Allergies    Adhesive [tape], Denture adhesive, and Morphine and related  Review of Systems   Review of Systems  Constitutional: Negative for chills and fever.  HENT: Negative for ear pain and sore throat.  Eyes: Negative for pain and visual disturbance.  Respiratory: Negative for cough and shortness of breath.   Cardiovascular: Negative for chest pain and palpitations.  Gastrointestinal: Positive for anal bleeding, blood in stool and hematochezia. Negative for abdominal pain and vomiting.  Genitourinary: Negative for dysuria and hematuria.  Musculoskeletal: Negative for arthralgias and back pain.  Skin: Negative for color change and rash.  Neurological: Negative for dizziness, seizures, loss of consciousness and syncope.  All other systems reviewed and are negative.   Physical Exam Updated Vital Signs  ED Triage Vitals  Enc Vitals Group     BP 06/08/20 1129 (!) 147/68     Pulse Rate 06/08/20 1129 (!) 104     Resp 06/08/20 1129 18     Temp 06/08/20 1129 98.2 F (36.8 C)     Temp Source 06/08/20 1129 Oral     SpO2 06/08/20 1129 98 %     Weight 06/08/20 1131 215 lb (97.5 kg)     Height 06/08/20 1131 5\' 6"  (1.676 m)     Head Circumference --      Peak Flow --      Pain Score 06/08/20 1130 5     Pain Loc --      Pain Edu? --      Excl. in [place]? --     Physical Exam Vitals and nursing note reviewed.  Constitutional:      General: She is not in acute distress.    Appearance: She is well-developed and well-nourished. She is not ill-appearing.  HENT:     Head: Normocephalic and atraumatic.     Nose: Nose normal.      Mouth/Throat:     Mouth: Mucous membranes are moist.  Eyes:     Extraocular Movements: Extraocular movements intact.     Conjunctiva/sclera: Conjunctivae normal.     Pupils: Pupils are equal, round, and reactive to light.  Cardiovascular:     Rate and Rhythm: Normal rate and regular rhythm.     Pulses: Normal pulses.     Heart sounds: Normal heart sounds. No murmur heard.   Pulmonary:     Effort: Pulmonary effort is normal. No respiratory distress.     Breath sounds: Normal breath sounds.  Abdominal:     Palpations: Abdomen is soft.     Tenderness: There is no abdominal tenderness.  Genitourinary:    Rectum: Guaiac result positive (maroon stool).  Musculoskeletal:        General: No edema.     Cervical back: Neck supple.  Skin:    General: Skin is warm and dry.     Capillary Refill: Capillary refill takes less than 2 seconds.  Neurological:     General: No focal deficit present.     Mental Status: She is alert.  Psychiatric:        Mood and Affect: Mood and affect and mood normal.     ED Results / Procedures / Treatments   Labs (all labs ordered are listed, but only abnormal results are displayed) Labs Reviewed  COMPREHENSIVE METABOLIC PANEL - Abnormal; Notable for the following components:      Result Value   Glucose, Bld 114 (*)    All other components within normal limits  CBC - Abnormal; Notable for the following components:   Hemoglobin 11.8 (*)    All other components within normal limits  RESP PANEL BY RT-PCR (FLU A&B, COVID) ARPGX2  OCCULT BLOOD X 1 CARD TO LAB, STOOL  URINALYSIS, ROUTINE  W REFLEX MICROSCOPIC  CBC    EKG EKG Interpretation  Date/Time:  Sunday June 08 2020 16:37:47 EST Ventricular Rate:  81 PR Interval:    QRS Duration: 95 QT Interval:  381 QTC Calculation: 443 R Axis:   26 Text Interpretation: Sinus rhythm Confirmed by Lennice Sites [phone]) on 06/08/2020 5:08:52 PM   Radiology No results found.  Procedures .Critical  Care Performed by: Lennice Sites, DO Authorized by: Lennice Sites, DO   Critical care provider statement:    Critical care time (minutes):  35   Critical care was necessary to treat or prevent imminent or life-threatening deterioration of the following conditions:  Circulatory failure   Critical care was time spent personally by me on the following activities:  Blood draw for specimens, development of treatment plan with patient or surrogate, discussions with consultants, discussions with primary provider, evaluation of patient's response to treatment, examination of patient, obtaining history from patient or surrogate, ordering and performing treatments and interventions, ordering and review of laboratory studies, ordering and review of radiographic studies, re-evaluation of patient's condition, review of old charts and pulse oximetry   I assumed direction of critical care for this patient from another provider in my specialty: no     (including critical care time)  Medications Ordered in ED Medications  pantoprazole (PROTONIX) injection 40 mg (40 mg Intravenous Given 06/08/20 1718)  iohexol (OMNIPAQUE) 350 MG/ML injection 100 mL (100 mLs Intravenous Contrast Given 06/08/20 1626)    ED Course  I have reviewed the triage vital signs and the nursing notes.  Pertinent labs & imaging results that were available during my care of the patient were reviewed by me and considered in my medical decision making (see chart for details).    MDM Rules/Calculators/A&P                          Crystal Lee is an 87 year old female with history of high cholesterol who presents to the ED with rectal bleeding.  History of adematous polyps.  Bright red blood per rectum starting at 4 AM.  Has not had much larger bowel movements or heavy clot passing since then.  But still having more maroon color stools when she is having bowel movements today.  Has maroon color stool on exam.  No rectal tenderness or abdominal  tenderness but has some low back pain.  Talked with Dr. Hilarie Fredrickson with [facility] as patient follows with their group.  No history of diverticulitis.  Could be a diverticular bleed or hemorrhoid bleed or a polyp bleed.  Initial hemoglobin is 11.8.  This is down about a gram and a half from about 3 months ago.  We will get a CT angio abdomen and pelvis to see if there is any source of bleeding that could be amenable to IR.  Right now she appears hemodynamically stable.  She was given a dose of IV Protonix.  Repeat hemoglobin has been sent for.  To be admitted to medicine for further care.  CTA showed focal area of inflammation versus possible neoplasm in the distal transverse colon.  Colonoscopy is recommended.  Otherwise no obvious source of bleeding.  Patient remains hemodynamically stable.  To be admitted to medicine for further care.  This chart was dictated using voice recognition software.  Despite best efforts to proofread,  errors can occur which can change the documentation meaning.    Final Clinical Impression(s) / ED Diagnoses Final diagnoses:  Rectal bleeding  Gastrointestinal hemorrhage, unspecified gastrointestinal hemorrhage type    Rx / DC Orders ED Discharge Orders    None       Lennice Sites, DO 06/08/20 1738

## 2020-06-08 NOTE — ED Notes (Signed)
Security updated regarding patients car-[employer], Silver parked in handicapped

## 2020-06-08 NOTE — Progress Notes (Signed)
Patient arrived from [facility] around [phone]. Pt has no c/o any pain or discomfort at this time, pt oriented to room, call light etc. Admission doctor is notified.

## 2020-06-08 NOTE — ED Triage Notes (Signed)
Pt states she woke up during the night and had rectal bleeding. Denied stool, states it was only blood. Denies blood thinner use. Complains of lower back pain now radiating to abdomen. States feels SOB and lightheaded. States has hemmorhoids but doesn't thinks its related.

## 2020-06-09 DIAGNOSIS — K760 Fatty (change of) liver, not elsewhere classified: Secondary | ICD-10-CM | POA: Diagnosis present

## 2020-06-09 DIAGNOSIS — Z86010 Personal history of colonic polyps: Secondary | ICD-10-CM | POA: Diagnosis not present

## 2020-06-09 DIAGNOSIS — K635 Polyp of colon: Secondary | ICD-10-CM | POA: Diagnosis not present

## 2020-06-09 DIAGNOSIS — M818 Other osteoporosis without current pathological fracture: Secondary | ICD-10-CM | POA: Diagnosis present

## 2020-06-09 DIAGNOSIS — Z85820 Personal history of malignant melanoma of skin: Secondary | ICD-10-CM | POA: Diagnosis not present

## 2020-06-09 DIAGNOSIS — Z96643 Presence of artificial hip joint, bilateral: Secondary | ICD-10-CM | POA: Diagnosis present

## 2020-06-09 DIAGNOSIS — E876 Hypokalemia: Secondary | ICD-10-CM | POA: Diagnosis not present

## 2020-06-09 DIAGNOSIS — Z9013 Acquired absence of bilateral breasts and nipples: Secondary | ICD-10-CM | POA: Diagnosis not present

## 2020-06-09 DIAGNOSIS — E785 Hyperlipidemia, unspecified: Secondary | ICD-10-CM | POA: Diagnosis present

## 2020-06-09 DIAGNOSIS — I110 Hypertensive heart disease with heart failure: Secondary | ICD-10-CM | POA: Diagnosis not present

## 2020-06-09 DIAGNOSIS — C772 Secondary and unspecified malignant neoplasm of intra-abdominal lymph nodes: Secondary | ICD-10-CM | POA: Diagnosis not present

## 2020-06-09 DIAGNOSIS — D696 Thrombocytopenia, unspecified: Secondary | ICD-10-CM | POA: Diagnosis not present

## 2020-06-09 DIAGNOSIS — K219 Gastro-esophageal reflux disease without esophagitis: Secondary | ICD-10-CM

## 2020-06-09 DIAGNOSIS — Z90710 Acquired absence of both cervix and uterus: Secondary | ICD-10-CM | POA: Diagnosis not present

## 2020-06-09 DIAGNOSIS — I2510 Atherosclerotic heart disease of native coronary artery without angina pectoris: Secondary | ICD-10-CM | POA: Diagnosis not present

## 2020-06-09 DIAGNOSIS — K922 Gastrointestinal hemorrhage, unspecified: Secondary | ICD-10-CM | POA: Diagnosis present

## 2020-06-09 DIAGNOSIS — Z9842 Cataract extraction status, left eye: Secondary | ICD-10-CM | POA: Diagnosis not present

## 2020-06-09 DIAGNOSIS — I10 Essential (primary) hypertension: Secondary | ICD-10-CM | POA: Diagnosis not present

## 2020-06-09 DIAGNOSIS — Z20822 Contact with and (suspected) exposure to covid-19: Secondary | ICD-10-CM | POA: Diagnosis not present

## 2020-06-09 DIAGNOSIS — Z853 Personal history of malignant neoplasm of breast: Secondary | ICD-10-CM | POA: Diagnosis not present

## 2020-06-09 DIAGNOSIS — J9811 Atelectasis: Secondary | ICD-10-CM | POA: Diagnosis not present

## 2020-06-09 DIAGNOSIS — I5032 Chronic diastolic (congestive) heart failure: Secondary | ICD-10-CM | POA: Diagnosis not present

## 2020-06-09 DIAGNOSIS — Z87891 Personal history of nicotine dependence: Secondary | ICD-10-CM | POA: Diagnosis not present

## 2020-06-09 DIAGNOSIS — K5730 Diverticulosis of large intestine without perforation or abscess without bleeding: Secondary | ICD-10-CM | POA: Diagnosis not present

## 2020-06-09 DIAGNOSIS — D62 Acute posthemorrhagic anemia: Secondary | ICD-10-CM | POA: Diagnosis not present

## 2020-06-09 DIAGNOSIS — Z96651 Presence of right artificial knee joint: Secondary | ICD-10-CM | POA: Diagnosis present

## 2020-06-09 DIAGNOSIS — K317 Polyp of stomach and duodenum: Secondary | ICD-10-CM | POA: Diagnosis not present

## 2020-06-09 DIAGNOSIS — I700 Atherosclerosis of aorta: Secondary | ICD-10-CM | POA: Diagnosis not present

## 2020-06-09 DIAGNOSIS — K625 Hemorrhage of anus and rectum: Secondary | ICD-10-CM | POA: Diagnosis not present

## 2020-06-09 DIAGNOSIS — R970 Elevated carcinoembryonic antigen [CEA]: Secondary | ICD-10-CM | POA: Diagnosis not present

## 2020-06-09 DIAGNOSIS — C186 Malignant neoplasm of descending colon: Secondary | ICD-10-CM | POA: Diagnosis not present

## 2020-06-09 DIAGNOSIS — D125 Benign neoplasm of sigmoid colon: Secondary | ICD-10-CM | POA: Diagnosis not present

## 2020-06-09 DIAGNOSIS — C184 Malignant neoplasm of transverse colon: Secondary | ICD-10-CM | POA: Diagnosis not present

## 2020-06-09 DIAGNOSIS — K629 Disease of anus and rectum, unspecified: Secondary | ICD-10-CM | POA: Diagnosis not present

## 2020-06-09 DIAGNOSIS — K6389 Other specified diseases of intestine: Secondary | ICD-10-CM | POA: Diagnosis not present

## 2020-06-09 DIAGNOSIS — K5731 Diverticulosis of large intestine without perforation or abscess with bleeding: Secondary | ICD-10-CM | POA: Diagnosis not present

## 2020-06-09 DIAGNOSIS — Z9841 Cataract extraction status, right eye: Secondary | ICD-10-CM | POA: Diagnosis not present

## 2020-06-09 DIAGNOSIS — C189 Malignant neoplasm of colon, unspecified: Secondary | ICD-10-CM | POA: Diagnosis not present

## 2020-06-09 HISTORY — DX: Gastrointestinal hemorrhage, unspecified: K92.2

## 2020-06-09 LAB — RETICULOCYTES
Immature Retic Fract: 11.4 % (ref 2.3–15.9)
RBC.: 3.54 MIL/uL — ABNORMAL LOW (ref 3.87–5.11)
Retic Count, Absolute: 56.3 10*3/uL (ref 19.0–186.0)
Retic Ct Pct: 1.6 % (ref 0.4–3.1)

## 2020-06-09 LAB — FOLATE: Folate: >24.8 ng/mL (ref >5.9)

## 2020-06-09 LAB — FERRITIN: Ferritin: 45 ng/mL (ref 11–307)

## 2020-06-09 LAB — TYPE AND SCREEN
ABO/RH(D): O POS
Antibody Screen: NEGATIVE

## 2020-06-09 LAB — IRON AND TIBC
Iron: 49 ug/dL (ref 28–170)
Saturation Ratios: 15 % (ref 10.4–31.8)
TIBC: 320 ug/dL (ref 250–450)
UIBC: 271 ug/dL

## 2020-06-09 LAB — VITAMIN B12: Vitamin B-12: 659 pg/mL (ref 180–914)

## 2020-06-09 LAB — HEMOGLOBIN AND HEMATOCRIT, BLOOD
HCT: 31.6 % — ABNORMAL LOW (ref 36.0–46.0)
HCT: 31.4 % — ABNORMAL LOW (ref 36.0–46.0)
Hemoglobin: 10.2 g/dL — ABNORMAL LOW (ref 12.0–15.0)
Hemoglobin: 10.1 g/dL — ABNORMAL LOW (ref 12.0–15.0)

## 2020-06-09 MED ORDER — SODIUM CHLORIDE 0.9 % IV SOLN: INTRAVENOUS | Status: AC

## 2020-06-09 MED ORDER — HYDROCODONE-ACETAMINOPHEN 5-325 MG PO TABS
1.0000 | ORAL_TABLET | Freq: Four times a day (QID) | ORAL | Status: DC | PRN
  Administered 2020-06-09 – 2020-06-11 (×9): 1 via ORAL
  Filled 2020-06-09 (×9): qty 1

## 2020-06-09 MED ORDER — PEG-KCL-NACL-NASULF-NA ASC-C 100 G PO SOLR
0.5000 | ORAL | Status: AC
  Administered 2020-06-09 – 2020-06-10 (×2): 100 g via ORAL
  Filled 2020-06-09: qty 1

## 2020-06-09 MED ORDER — BISACODYL 5 MG PO TBEC
10.0000 mg | DELAYED_RELEASE_TABLET | Freq: Once | ORAL | Status: AC
  Administered 2020-06-09: 10 mg via ORAL
  Filled 2020-06-09: qty 2

## 2020-06-09 MED ORDER — PEG-KCL-NACL-NASULF-NA ASC-C 100 G PO SOLR
0.5000 | Freq: Two times a day (BID) | ORAL | Status: DC
  Filled 2020-06-09: qty 1

## 2020-06-09 MED ORDER — BISACODYL 5 MG PO TBEC
10.0000 mg | DELAYED_RELEASE_TABLET | Freq: Once | ORAL | Status: AC
  Administered 2020-06-10: 10 mg via ORAL
  Filled 2020-06-09: qty 2

## 2020-06-09 MED ORDER — ONDANSETRON HCL 4 MG/2ML IJ SOLN
4.0000 mg | Freq: Three times a day (TID) | INTRAMUSCULAR | Status: DC | PRN
  Administered 2020-06-09: 22:00:00 4 mg via INTRAVENOUS
  Filled 2020-06-09: qty 2

## 2020-06-09 NOTE — Progress Notes (Signed)
PROGRESS NOTE    Crystal Lee  EXH:[id number] DOB: [birth year] DOA: 06/08/2020 PCP: Darreld Mclean, MD   Brief Narrative: Crystal Lee is a 87 y.o. female with medical history significant of hypertension, hyperlipidemia, history of stage I breast cancer in remission, history of melanoma, GERD, osteoporosis, colon polyps. Patient presented secondary to rectal bleeding concerning for a lower GI bleed. GI consulted.   Assessment & Plan:   Principal Problem:   Lower GI bleed Active Problems:   GERD (gastroesophageal reflux disease)   Acute blood loss anemia   HTN (hypertension)   Acute lower GI bleeding   Hematochezia Patient with sigmoid diverticulosis on imaging which may be etiology of bleeding. GI consulted on admission and plan endoscopy evaluation on 1/11. -CBC daily  Acute blood loss anemia Patient's baseline hemoglobin is around 13. Hemoglobin on admission of 11.8 with continued drop. Hemoglobin of 10.1 today. Secondary to above.  Colonic inflammation/infiltrate Located in the distal transverse colon concerning for inflammation vs possible neoplasm. -Colonoscopy per GI  GERD -Continue Protonix  Primary hypertension Slightly uncontrolled. Patient is on losartan and Lasix as an outpatient.  Abnormal urinalysis Urine culture obtained on admission -Await urine culture    DVT prophylaxis: SCDs secondary to active GI bleeding Code Status:   Code Status: Full Code Family Communication: None at bedside Disposition Plan: Discharge home likely in 2-4 days pending GI evaluation/management   Consultants:   [facility]  Procedures:   None  Antimicrobials:  None    Subjective: Continued hematochezia. No other concerns.  Objective: Vitals:   06/09/20 0028 06/09/20 0426 06/09/20 0913 06/09/20 1515  BP: (!) 113/44 (!) 159/67 (!) 132/55 (!) 134/58  Pulse: 79 77 75 78  Resp: 18 18 (!) 23 16  Temp: 98.2 F (36.8 C) 97.8 F (36.6 C) 97.9 F  (36.6 C) 98.3 F (36.8 C)  TempSrc: Oral Oral Oral Oral  SpO2: 95% 96% 96% 97%  Weight:      Height:        Intake/Output Summary (Last 24 hours) at 06/09/2020 1550 Last data filed at 06/09/2020 1412 Gross per 24 hour  Intake -  Output 1000 ml  Net -1000 ml   Filed Weights   06/08/20 1131 06/08/20 1935  Weight: 97.5 kg 99.9 kg    Examination:  General exam: Appears calm and comfortable  Respiratory system: Clear to auscultation. Respiratory effort normal. Cardiovascular system: S1 & S2 heard, RRR. No murmurs, rubs, gallops or clicks. Gastrointestinal system: Abdomen is nondistended, soft and mildly tender in lower quadrants. No organomegaly or masses felt. Normal bowel sounds heard. Central nervous system: Alert and oriented. No focal neurological deficits. Musculoskeletal: No edema. No calf tenderness Skin: No cyanosis. No rashes Psychiatry: Judgement and insight appear normal. Mood & affect appropriate.     Data Reviewed: I have personally reviewed following labs and imaging studies  CBC Lab Results  Component Value Date   WBC 7.0 06/08/2020   RBC 3.54 (L) 06/09/2020   HGB 10.1 (L) 06/09/2020   HCT 31.4 (L) 06/09/2020   MCV 89.4 06/08/2020   MCH 29.3 06/08/2020   PLT 148 (L) 06/08/2020   MCHC 32.7 06/08/2020   RDW 12.9 06/08/2020   LYMPHSABS 2.7 04/07/2020   MONOABS 0.6 04/07/2020   EOSABS 0.2 04/07/2020   BASOSABS 0.0 38/02/1750     Last metabolic panel Lab Results  Component Value Date   NA 137 06/08/2020   K 4.2 06/08/2020   CL 104 06/08/2020   CO2 23  06/08/2020   BUN 14 06/08/2020   CREATININE 0.61 06/08/2020   GLUCOSE 114 (H) 06/08/2020   GFRNONAA >60 06/08/2020   GFRAA 93 02/25/2020   CALCIUM 9.0 06/08/2020   PROT 7.2 06/08/2020   ALBUMIN 3.8 06/08/2020   LABGLOB 2.7 02/09/2017   AGRATIO 1.5 02/09/2017   BILITOT 0.4 06/08/2020   ALKPHOS 71 06/08/2020   AST 37 06/08/2020   ALT 25 06/08/2020   ANIONGAP 10 06/08/2020    CBG (last 3)   No results for input(s): GLUCAP in the last 72 hours.   GFR: Estimated Creatinine Clearance: 62.4 mL/min (by C-G formula based on SCr of 0.61 mg/dL).  Coagulation Profile: No results for input(s): INR, PROTIME in the last 168 hours.  Recent Results (from the past 240 hour(s))  Resp Panel by RT-PCR (Flu A&B, Covid)     Status: None   Collection Time: 06/08/20  4:53 PM   Specimen: Nasopharyngeal(NP) swabs in vial transport medium  Result Value Ref Range Status   SARS Coronavirus 2 by RT PCR NEGATIVE NEGATIVE Final    Comment: (NOTE) SARS-CoV-2 target nucleic acids are NOT DETECTED.  The SARS-CoV-2 RNA is generally detectable in upper respiratory specimens during the acute phase of infection. The lowest concentration of SARS-CoV-2 viral copies this assay can detect is 138 copies/mL. A negative result does not preclude SARS-Cov-2 infection and should not be used as the sole basis for treatment or other patient management decisions. A negative result may occur with  improper specimen collection/handling, submission of specimen other than nasopharyngeal swab, presence of viral mutation(s) within the areas targeted by this assay, and inadequate number of viral copies(<138 copies/mL). A negative result must be combined with clinical observations, patient history, and epidemiological information. The expected result is Negative.  Fact Sheet for Patients:  EntrepreneurPulse.com.au  Fact Sheet for Healthcare Providers:  IncredibleEmployment.be  This test is no t yet approved or cleared by the [place] FDA and  has been authorized for detection and/or diagnosis of SARS-CoV-2 by FDA under an Emergency Use Authorization (EUA). This EUA will remain  in effect (meaning this test can be used) for the duration of the COVID-19 declaration under Section 564(b)(1) of the Act, 21 U.S.C.section 360bbb-3(b)(1), unless the authorization is terminated  or  revoked sooner.       Influenza A by PCR NEGATIVE NEGATIVE Final   Influenza B by PCR NEGATIVE NEGATIVE Final    Comment: (NOTE) The Xpert Xpress SARS-CoV-2/FLU/RSV plus assay is intended as an aid in the diagnosis of influenza from Nasopharyngeal swab specimens and should not be used as a sole basis for treatment. Nasal washings and aspirates are unacceptable for Xpert Xpress SARS-CoV-2/FLU/RSV testing.  Fact Sheet for Patients: EntrepreneurPulse.com.au  Fact Sheet for Healthcare Providers: IncredibleEmployment.be  This test is not yet approved or cleared by the [place] FDA and has been authorized for detection and/or diagnosis of SARS-CoV-2 by FDA under an Emergency Use Authorization (EUA). This EUA will remain in effect (meaning this test can be used) for the duration of the COVID-19 declaration under Section 564(b)(1) of the Act, 21 U.S.C. section 360bbb-3(b)(1), unless the authorization is terminated or revoked.  Performed at [facility], [address], [place], [place] [address]         Radiology Studies: DG CHEST PORT 1 VIEW  Result Date: 06/08/2020 CLINICAL DATA:  [place] EXAM: PORTABLE CHEST 1 VIEW COMPARISON:  06/18/2017 FINDINGS: Heart and mediastinal contours are within normal limits. No focal opacities or  effusions. No acute bony abnormality. Aortic atherosclerosis IMPRESSION: No active disease. Electronically Signed   By: Rolm Baptise M.D.   On: 06/08/2020 21:24   CT Angio Abd/Pel W and/or Wo Contrast  Result Date: 06/08/2020 CLINICAL DATA:  87 year old female with GI bleed. EXAM: CTA ABDOMEN AND PELVIS WITHOUT AND WITH CONTRAST TECHNIQUE: Multidetector CT imaging of the abdomen and pelvis was performed using the standard protocol during bolus administration of intravenous contrast. Multiplanar reconstructed images and MIPs were obtained and reviewed to evaluate the vascular anatomy. CONTRAST:  155mL OMNIPAQUE  IOHEXOL 350 MG/ML SOLN COMPARISON:  CT of the chest abdomen pelvis dated 04/30/2019. FINDINGS: VASCULAR Aorta: Moderate atherosclerotic calcification of the aorta. No aneurysmal dilatation or dissection. No periaortic fluid collection or hematoma. Celiac: Patent without evidence of aneurysm, dissection, vasculitis or significant stenosis. SMA: Atherosclerotic calcification of the origin of the SMA. The SMA is patent. Renals: Atherosclerotic calcification of the origin of the renal arteries. The renal arteries are patent. IMA: Patent without evidence of aneurysm, dissection, vasculitis or significant stenosis. Inflow: Patent without evidence of aneurysm, dissection, vasculitis or significant stenosis. Proximal Outflow: Bilateral common femoral and visualized portions of the superficial and profunda femoral arteries are patent without evidence of aneurysm, dissection, vasculitis or significant stenosis. Veins: The IVC is unremarkable. The SMV, splenic vein, and main portal vein are patent. No portal venous gas. Review of the MIP images confirms the above findings. NON-VASCULAR Lower chest: The visualized lung bases are clear. There is coronary vascular calcification. No intra-abdominal free air or free fluid. Hepatobiliary: The liver is unremarkable. No intrahepatic biliary dilatation. Cholecystectomy. Retained calcified stone noted in the central CBD. Pancreas: Unremarkable. No pancreatic ductal dilatation or surrounding inflammatory changes. Spleen: Normal in size without focal abnormality. Adrenals/Urinary Tract: The adrenal glands unremarkable. Probable small bilateral parapelvic cyst third the kidneys, visualized ureters, and urinary bladder appear unremarkable. Stomach/Bowel: There is moderate amount of stool throughout the colon. There is sigmoid diverticulosis without active inflammatory changes. There is a focal area of wall thickening and mild haziness involving approximately 4.5 cm segment of the distal  transverse colon (coronal 28/6 and axial 52/10). This may represent a focal area of inflammation but concerning for underlying infiltrative neoplasm. Further evaluation with colonoscopy is recommended. There is no bowel obstruction. The appendix is not visualized with certainty. No inflammatory changes identified in the right lower quadrant. Lymphatic: Several mildly rounded and enlarged lymph node adjacent to the inflamed segment of distal transverse colon. The larger lymph node measures approximately 11 mm in short axis. Reproductive: Hysterectomy. No adnexal masses. Other: None Musculoskeletal: Osteopenia with degenerative changes of the spine. Bilateral total hip arthroplasties. Partially visualized breast prostheses. No acute osseous pathology. L3 vertebroplasty. IMPRESSION: 1. Focal area of inflammation or infiltrative neoplasm involving distal transverse colon. Further evaluation with colonoscopy is recommended. 2. Sigmoid diverticulosis.  No bowel obstruction. 3. Aortic Atherosclerosis (ICD10-I70.0). Electronically Signed   By: Anner Crete M.D.   On: 06/08/2020 17:30        Scheduled Meds: . bisacodyl  10 mg Oral Once  . [START ON 06/10/2020] bisacodyl  10 mg Oral Once  . pantoprazole (PROTONIX) IV  40 mg Intravenous Q24H  . peg 3350 powder  0.5 kit Oral 2 times per day on Mon   Continuous Infusions:   LOS: 0 days     Cordelia Poche, MD [facility] 06/09/2020, 3:50 PM  If 7PM-7AM, please contact night-coverage [email or web address]

## 2020-06-09 NOTE — Consult Note (Signed)
Referring Provider: Dr. Cordelia Poche  Primary Care Physician:  Lorelei Pont Gay Filler, MD Primary Gastroenterologist:  Dr. Bryan Lemma   Reason for Consultation:  Hematochezia   HPI: Crystal Lee is a 87 y.o. female with a past medical history of arthritis, osteoporosis, hypertension, diastolic CHF with EF 60 -22% per ECHO 08/10/2019, hyperlipidemia, sleep apnea, melanoma, breast cancer s/p bilateral mastectomy 1998, thoracic adenopathy of undetermined significance,  hepatic steatosis, GERD and tubular adenomatous colon polyps. Past cholecystectomy, total hysterectomy, appendectomy, total right hip replacement 2010 and left hip replacement 2016 and total right knee replacement 2001.   She presented to [facility] ED 06/09/2019 due to having bright red blood per the rectum which started at 4am. She passed a large amount of bright red blood with soft stool x 3 episodes. She also noted having lower abdominal pain which started 3 to 4 days ago. No ASA or other NSAID use. No fever. In the ED, her Hg level was 11.8 (base line Hg 13.3 on 04/07/2020). Rectal exam by the ED physician  showed maroon colored stool. An abdominal/pelvic CT angiogram showed a focal area of inflammation versus possible neoplasm in the distal transverse colon. She was transferred to [facility] for further GI evaluation.   Currently, she continue to have generalized lower abdominal pain. She passed 2 small BMs with less red blood over night. She saw a small amount of bright red blood on the toilet tissue around 6am after urinating. While I was examining the patient, she went to the bathroom and passed a small amount of brown stool with a small amount of darker red blood.  She received Tylenol earlier this morning without significant pain relief. She received Hydrocodone yesterday evening with adequate pain relief. She remains on clear liquids. She is hemodynamically stable. No prior history of GI bleeding. She underwent a  colonoscopy 09/2015 while living in [place], [place] and 2 tubular adenomatous polyps were removed. No family history of colorectal cancer.  She complains of having intermittent acid reflux.  She has a diminished sense of taste for several months with an increased bitterness in her mouth.  No dysphagia.  No upper abdominal pain.  She takes Tums as needed.  She wishes to have an EGD and colonoscopy during this hospital admission.  She underwent an EGD in 2016 which showed gastritis and a gastric polyp.  No evidence of H. pylori.  No family history of colorectal cancer.  Her husband passed away almost 2 years ago.  She has 2 daughters, 1 daughter lives in [place] and the other daughter lives in [place].  Laboratory studies 06/08/2020: Sodium 137.  Potassium 4.2.  Glucose 114.  BUN 14.  Creatinine 0.61.  Calcium 9.0.  Alk phos 71.  Albumin 3.8.  AST 37.  ALT 25.  Total bili 0.4.  WBC 7.4.  Hemoglobin 11.8.  Hematocrit 36.4.  MCV 89.4.  Platelet 161.  Influenza A and B negative.  SARS coronavirus 2 negative.  FOBT positive  06/09/2020: Iron 49.  Ferritin 45.  B12 659.  Folate > 24.8.  Hemoglobin 10.1.  Hematocrit 31.4.  Abd/Pelvic CT Angiogram 06/08/2020:  1. There is moderate amount of stool throughout the colon. There is sigmoid diverticulosis without active inflammatory changes. Focal area of inflammation or infiltrative neoplasm involving distal transverse colon. Further evaluation with colonoscopy is recommended. 2. Sigmoid diverticulosis.  No bowel obstruction. 3. Aortic Atherosclerosis   Chest x-ray 06/08/2020: No active disease  Echo 07/24/2019: 1. Left ventricular  ejection fraction, by estimation, is 60 to 65%. The left ventricle has normal function. The left ventricle has no regional wall motion abnormalities. There is mild left ventricular hypertrophy. Left ventricular diastolic parameters were normal. 2. The mitral valve is normal in structure and function. No evidence of mitral valve  regurgitation. No evidence of mitral stenosis. 3. The aortic valve is normal in structure and function. Aortic valve regurgitation is not visualized. No aortic stenosis is present. 4. There is mild dilatation of the ascending aorta measuring 38 mm. 5. The inferior vena cava is normal in size with greater than 50% respiratory variability, suggesting right atrial pressure of 3 mmHg.  Endoscopic History: - Colonoscopy (09/2015, [place], [place]): 2 subcentimeter tubular adenomas.  Recommended repeat in 5 years for ongoing surveillance. -EGD (03/2015, [place], [place]): Non-H. pylori gastritis, gastric polyp.   Past Medical History:  Diagnosis Date  . Arthritis   . Cancer (HCC)    HX BREAST CANCER/ SKIN CANCER  . Complication of anesthesia    N/V WITH MORPHINE  . Difficulty sleeping   . Fractured hip ([place])    LEFT - AUG 2016  . GERD (gastroesophageal reflux disease)   . Hyperlipidemia   . Hypertension   . Melanoma ([place])   . Neuropathy   . Nocturia   . Osteopenia   . 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 PONV (postoperative nausea and vomiting)    PT STATES MORPHINE CAUSED N/V  . Rosacea   . Sleep apnea   . Stage 1 breast cancer, ER+, right ([place]) 07/04/2017    Past Surgical History:  Procedure Laterality Date  . [place] STUDY N/A 06/21/2018   Procedure: [place] STUDY;  Surgeon: Lavena Bullion, DO;  Location: [facility];  Service: Gastroenterology;  Laterality: N/A;  with impedance  . ABDOMINAL HYSTERECTOMY  2010  . ANAL RECTAL MANOMETRY N/A 09/19/2019   Procedure: ANO RECTAL MANOMETRY;  Surgeon: Mauri Pole, MD;  Location: [facility];  Service: Endoscopy;  Laterality: N/A;  . APPENDECTOMY  1949  . [place]  . BREAST SURGERY    . CATARACT EXTRACTION Bilateral 2011  . CHOLECYSTECTOMY    . ESOPHAGEAL MANOMETRY N/A 06/21/2018   Procedure: ESOPHAGEAL MANOMETRY (EM);  Surgeon: Lavena Bullion, DO;  Location: [facility];  Service:  Gastroenterology;  Laterality: N/A;  . GALLBLADDER SURGERY  2015  . JOINT REPLACEMENT     RT TOTAL HIP / RT TOTAL KNEE  . MASTECTOMY  1998   BILATERAL   . [place] IMPEDANCE STUDY  06/21/2018   Procedure: [place] IMPEDANCE STUDY;  Surgeon: Lavena Bullion, DO;  Location: [facility];  Service: Gastroenterology;;  . SKIN CANCER EXCISION  2016   RT SIDE OF NOSE  . TONSILLECTOMY  1953  . TOTAL HIP ARTHROPLASTY  2010   RIGHT  . TOTAL HIP ARTHROPLASTY Left 05/09/2015   Procedure: LEFT TOTAL HIP ARTHROPLASTY ANTERIOR APPROACH;  Surgeon: Gaynelle Arabian, MD;  Location: [facility];  Service: Orthopedics;  Laterality: Left;  . TOTAL KNEE ARTHROPLASTY  2001  . TUMOR REMOVAL  2012   ABDOMINAL - NON CANCEROUS    Prior to Admission medications   Medication Sig Start Date End Date Taking? Authorizing Provider  cyclobenzaprine (FLEXERIL) 5 MG tablet TAKE ONE TABLET BY MOUTH AT BEDTIME AS NEEDED FOR MUSCLE SPASMS Patient taking differently: Take 5 mg by mouth at bedtime. 09/18/19  Yes Bayard Hugger, NP  fenofibrate (TRICOR) 48 MG tablet Take  1 tablet (48 mg total) by mouth daily. 01/28/20  Yes Copland, Jessica C, MD  HYDROcodone-acetaminophen (NORCO) 10-325 MG tablet Take 1 tablet by mouth every 6 (six) hours as needed. Patient taking differently: Take 1 tablet by mouth every 6 (six) hours as needed for moderate pain. 05/20/20  Yes Thomas, Eunice L, NP  losartan (COZAAR) 25 MG tablet Take 1 tablet (25 mg total) by mouth daily. 07/11/19  Yes Copland, Jessica C, MD  Multiple Vitamins-Minerals (MULTIVITAMIN ADULTS) TABS Take 1 tablet by mouth daily.   Yes [provider]  ergocalciferol (VITAMIN D2) 1.25 MG (50000 UT) capsule Take 1 capsule (50,000 Units total) by mouth once a week. Patient not taking: No sig reported 12/05/19   Ennever, Peter R, MD  furosemide (LASIX) 20 MG tablet Take 1 tablet (20 mg total) by mouth daily. 07/13/19 02/25/20  Munley, Brian J, MD  metroNIDAZOLE (METROGEL) 1 % gel Apply topically  daily. Use as needed for rosacea Patient not taking: No sig reported 07/11/19   Copland, Jessica C, MD  moxifloxacin (VIGAMOX) 0.5 % ophthalmic solution  06/07/20   [provider]  potassium chloride (KLOR-CON) 10 MEQ tablet Take 1 tablet (10 mEq total) by mouth daily. 12/11/19 03/10/20  Munley, Brian J, MD    Current Facility-Administered Medications  Medication Dose Route Frequency Provider Last Rate Last Admin  . 0.9 %  sodium chloride infusion   Intravenous Continuous Rathore, Vasundhra, MD 125 mL/hr at 06/09/20 0055 New Bag at 06/09/20 0055  . acetaminophen (TYLENOL) tablet 650 mg  650 mg Oral Q6H PRN Rathore, Vasundhra, MD   650 mg at 06/09/20 0735   Or  . acetaminophen (TYLENOL) suppository 650 mg  650 mg Rectal Q6H PRN Rathore, Vasundhra, MD      . pantoprazole (PROTONIX) injection 40 mg  40 mg Intravenous Q24H Rathore, Vasundhra, MD   40 mg at 06/08/20 1718    Allergies as of 06/08/2020 - Review Complete 06/08/2020  Allergen Reaction Noted  . Darvon [propoxyphene] Nausea And Vomiting and Palpitations 06/08/2020  . Adhesive [tape]  06/06/2019  . Denture adhesive  05/20/2020  . Morphine and related Nausea And Vomiting 06/18/2017    Family History  Problem Relation Age of Onset  . Other Mother        complications from flu  . Other Father        unsure of cause  . Colon cancer Neg Hx     Social History   Socioeconomic History  . Marital status: Widowed    Spouse name: Not on file  . Number of children: 3  . Years of education: 16 years  . Highest education level: Not on file  Occupational History  . Occupation: Retired  Tobacco Use  . Smoking status: Former Smoker    Packs/day: 0.50    Years: 20.00    Pack years: 10.00    Quit date: 02/03/1976    Years since quitting: 44.3  . Smokeless tobacco: Never Used  Vaping Use  . Vaping Use: Never used  Substance and Sexual Activity  . Alcohol use: No  . Drug use: No  . Sexual activity: Yes    Birth  control/protection: Post-menopausal  Other Topics Concern  . Not on file  Social History Narrative   Lives at home with husband.   Right-handed.   No caffeine use.   Social Determinants of Health   Financial Resource Strain: Not on file  Food Insecurity: Not on file  Transportation Needs: Not on   file  Physical Activity: Not on file  Stress: Not on file  Social Connections: Not on file  Intimate Partner Violence: Not on file    Review of Systems: Gen: Denies fever, sweats or chills. No weight loss.  CV: Denies chest pain, palpitations or edema. Resp: Denies cough, shortness of breath of hemoptysis.  GI: See HPI GU : Denies urinary burning, blood in urine, increased urinary frequency or incontinence. MS: Arthritis, knee pain. Derm: Denies rash, itchiness, skin lesions or unhealing ulcers. Psych: Denies depression, anxiety memory loss. Heme: Denies easy bruising, bleeding. Neuro:  Denies headaches, dizziness or paresthesias. Endo:  Denies any problems with DM, thyroid or adrenal function.  Physical Exam: Vital signs in last 24 hours: Temp:  [97.8 F (36.6 C)-98.3 F (36.8 C)] 97.8 F (36.6 C) (01/10 0426) Pulse Rate:  [77-119] 77 (01/10 0426) Resp:  [18-25] 18 (01/10 0426) BP: (113-159)/(44-79) 159/67 (01/10 0426) SpO2:  [95 %-100 %] 96 % (01/10 0426) Weight:  [97.5 kg-99.9 kg] 99.9 kg (01/09 1935)   General:  Alert, well-developed, well-nourished, pleasant and cooperative in NAD. Head:  Normocephalic and atraumatic. Eyes:  No scleral icterus. Conjunctiva pink. Ears:  Normal auditory acuity. Nose:  No deformity, discharge or lesions. Mouth: Scattered missing dentition no ulcers or lesions.  Neck:  Supple. No lymphadenopathy or thyromegaly.  Lungs: Breath sounds clear throughout Heart: Regular rate and rhythm, no murmurs. Abdomen: Soft, nondistended.  Mild tenderness throughout the lower abdomen without rebound or guarding. Rectal: Maroon stool in the rectum per the  ED physician. Musculoskeletal:  Symmetrical without gross deformities.  Pulses:  Normal pulses noted. Extremities:  Bilateral LE edema, LLE > RLE (patient stated edema L > R is chronic). Negative Homan's sign. Neurologic:  Alert and  oriented x4. No focal deficits.  Skin:  Intact without significant lesions or rashes. Psych:  Alert and cooperative. Normal mood and affect.  Intake/Output from previous day: No intake/output data recorded. Intake/Output this shift: No intake/output data recorded.  Lab Results: Recent Labs    06/08/20 1211 06/08/20 1717 06/09/20 0033  WBC 7.4 7.0  --   HGB 11.8* 10.8* 10.2*  HCT 36.4 33.0* 31.6*  PLT 161 148*  --    BMET Recent Labs    06/08/20 1211  NA 137  K 4.2  CL 104  CO2 23  GLUCOSE 114*  BUN 14  CREATININE 0.61  CALCIUM 9.0   LFT Recent Labs    06/08/20 1211  PROT 7.2  ALBUMIN 3.8  AST 37  ALT 25  ALKPHOS 71  BILITOT 0.4   PT/INR No results for input(s): LABPROT, INR in the last 72 hours. Hepatitis Panel No results for input(s): HEPBSAG, HCVAB, HEPAIGM, HEPBIGM in the last 72 hours.    Studies/Results: DG CHEST PORT 1 VIEW  Result Date: 06/08/2020 CLINICAL DATA:  [place] EXAM: PORTABLE CHEST 1 VIEW COMPARISON:  06/18/2017 FINDINGS: Heart and mediastinal contours are within normal limits. No focal opacities or effusions. No acute bony abnormality. Aortic atherosclerosis IMPRESSION: No active disease. Electronically Signed   By: Rolm Baptise M.D.   On: 06/08/2020 21:24   CT Angio Abd/Pel W and/or Wo Contrast  Result Date: 06/08/2020 CLINICAL DATA:  87 year old female with GI bleed. EXAM: CTA ABDOMEN AND PELVIS WITHOUT AND WITH CONTRAST TECHNIQUE: Multidetector CT imaging of the abdomen and pelvis was performed using the standard protocol during bolus administration of intravenous contrast. Multiplanar reconstructed images and MIPs were obtained and reviewed to evaluate the vascular anatomy. CONTRAST:  144m  OMNIPAQUE IOHEXOL  350 MG/ML SOLN COMPARISON:  CT of the chest abdomen pelvis dated 04/30/2019. FINDINGS: VASCULAR Aorta: Moderate atherosclerotic calcification of the aorta. No aneurysmal dilatation or dissection. No periaortic fluid collection or hematoma. Celiac: Patent without evidence of aneurysm, dissection, vasculitis or significant stenosis. SMA: Atherosclerotic calcification of the origin of the SMA. The SMA is patent. Renals: Atherosclerotic calcification of the origin of the renal arteries. The renal arteries are patent. IMA: Patent without evidence of aneurysm, dissection, vasculitis or significant stenosis. Inflow: Patent without evidence of aneurysm, dissection, vasculitis or significant stenosis. Proximal Outflow: Bilateral common femoral and visualized portions of the superficial and profunda femoral arteries are patent without evidence of aneurysm, dissection, vasculitis or significant stenosis. Veins: The IVC is unremarkable. The SMV, splenic vein, and main portal vein are patent. No portal venous gas. Review of the MIP images confirms the above findings. NON-VASCULAR Lower chest: The visualized lung bases are clear. There is coronary vascular calcification. No intra-abdominal free air or free fluid. Hepatobiliary: The liver is unremarkable. No intrahepatic biliary dilatation. Cholecystectomy. Retained calcified stone noted in the central CBD. Pancreas: Unremarkable. No pancreatic ductal dilatation or surrounding inflammatory changes. Spleen: Normal in size without focal abnormality. Adrenals/Urinary Tract: The adrenal glands unremarkable. Probable small bilateral parapelvic cyst third the kidneys, visualized ureters, and urinary bladder appear unremarkable. Stomach/Bowel: There is moderate amount of stool throughout the colon. There is sigmoid diverticulosis without active inflammatory changes. There is a focal area of wall thickening and mild haziness involving approximately 4.5 cm segment of the distal transverse  colon (coronal 28/6 and axial 52/10). This may represent a focal area of inflammation but concerning for underlying infiltrative neoplasm. Further evaluation with colonoscopy is recommended. There is no bowel obstruction. The appendix is not visualized with certainty. No inflammatory changes identified in the right lower quadrant. Lymphatic: Several mildly rounded and enlarged lymph node adjacent to the inflamed segment of distal transverse colon. The larger lymph node measures approximately 11 mm in short axis. Reproductive: Hysterectomy. No adnexal masses. Other: None Musculoskeletal: Osteopenia with degenerative changes of the spine. Bilateral total hip arthroplasties. Partially visualized breast prostheses. No acute osseous pathology. L3 vertebroplasty. IMPRESSION: 1. Focal area of inflammation or infiltrative neoplasm involving distal transverse colon. Further evaluation with colonoscopy is recommended. 2. Sigmoid diverticulosis.  No bowel obstruction. 3. Aortic Atherosclerosis (ICD10-I70.0). Electronically Signed   By: Arash  Radparvar M.D.   On: 06/08/2020 17:30    IMPRESSION/PLAN:  1.  86-year-old female admitted to the hospital with lower abdominal pain, hematochezia and anemia.  No prior history of GI bleed.  Hg 11.8 -> 10.2 -> 10.1 (baseine Hg 13,3).  Abdominal/pelvic CT angiogram 06/08/2020 showed a focal area of inflammation/infiltrative neoplasm involving distal transverse colon. -Monitor H&H closely -Transfuse for hemoglobin less than 7 -Clear liquid diet -EGD and colonoscopy most likely tomorrow, await further recommendations per Dr. Cirigliano -Pain management per the hospitalist, patient requesting Hydrocodone.  -Continue pantoprazole 40 mg IV QD  2.  History of adenomatous colon polyps -See plan in  #1  3.  History of GERD. -See plan in  #1  4.  Remote history of breast cancer  5.  History of diastolic CHF. LV EF 60 - 65% per ECO 07/2019   M Kennedy-Smith  06/09/2020,  0:846AM      

## 2020-06-10 ENCOUNTER — Encounter (HOSPITAL_COMMUNITY): Payer: Self-pay | Admitting: Family Medicine

## 2020-06-10 ENCOUNTER — Encounter (HOSPITAL_COMMUNITY)
Admission: EM | Disposition: A | Discharge status: Home / Self Care | Payer: Self-pay | Source: Home / Self Care | Attending: Internal Medicine

## 2020-06-10 ENCOUNTER — Inpatient Hospital Stay (HOSPITAL_COMMUNITY): Payer: Medicare Other

## 2020-06-10 ENCOUNTER — Inpatient Hospital Stay (HOSPITAL_COMMUNITY): Payer: Medicare Other | Admitting: Certified Registered Nurse Anesthetist

## 2020-06-10 DIAGNOSIS — K922 Gastrointestinal hemorrhage, unspecified: Secondary | ICD-10-CM | POA: Diagnosis not present

## 2020-06-10 DIAGNOSIS — K6389 Other specified diseases of intestine: Secondary | ICD-10-CM

## 2020-06-10 DIAGNOSIS — K317 Polyp of stomach and duodenum: Secondary | ICD-10-CM

## 2020-06-10 DIAGNOSIS — D62 Acute posthemorrhagic anemia: Secondary | ICD-10-CM | POA: Diagnosis not present

## 2020-06-10 DIAGNOSIS — D125 Benign neoplasm of sigmoid colon: Secondary | ICD-10-CM

## 2020-06-10 DIAGNOSIS — K5730 Diverticulosis of large intestine without perforation or abscess without bleeding: Secondary | ICD-10-CM

## 2020-06-10 DIAGNOSIS — K219 Gastro-esophageal reflux disease without esophagitis: Secondary | ICD-10-CM | POA: Diagnosis not present

## 2020-06-10 DIAGNOSIS — I10 Essential (primary) hypertension: Secondary | ICD-10-CM | POA: Diagnosis not present

## 2020-06-10 HISTORY — PX: SUBMUCOSAL TATTOO INJECTION: SHX6856

## 2020-06-10 HISTORY — PX: POLYPECTOMY: SHX5525

## 2020-06-10 HISTORY — PX: ESOPHAGOGASTRODUODENOSCOPY (EGD) WITH PROPOFOL: SHX5813

## 2020-06-10 HISTORY — PX: COLONOSCOPY WITH PROPOFOL: SHX5780

## 2020-06-10 HISTORY — PX: BIOPSY: SHX5522

## 2020-06-10 LAB — URINE CULTURE: Culture: <10000 — AB

## 2020-06-10 LAB — CBC
HCT: 37.4 % (ref 36.0–46.0)
Hemoglobin: 11.8 g/dL — ABNORMAL LOW (ref 12.0–15.0)
MCH: 29.4 pg (ref 26.0–34.0)
MCHC: 31.6 g/dL (ref 30.0–36.0)
MCV: 93 fL (ref 80.0–100.0)
Platelets: 170 10*3/uL (ref 150–400)
RBC: 4.02 MIL/uL (ref 3.87–5.11)
RDW: 13 % (ref 11.5–15.5)
WBC: 7.5 10*3/uL (ref 4.0–10.5)
nRBC: 0 % (ref 0.0–0.2)

## 2020-06-10 LAB — BASIC METABOLIC PANEL
Anion gap: 10 (ref 5–15)
BUN: 8 mg/dL (ref 8–23)
CO2: 19 mmol/L — ABNORMAL LOW (ref 22–32)
Calcium: 8.9 mg/dL (ref 8.9–10.3)
Chloride: 115 mmol/L — ABNORMAL HIGH (ref 98–111)
Creatinine, Ser: 0.67 mg/dL (ref 0.44–1.00)
GFR, Estimated: >60 mL/min (ref >60)
Glucose, Bld: 106 mg/dL — ABNORMAL HIGH (ref 70–99)
Potassium: 3.4 mmol/L — ABNORMAL LOW (ref 3.5–5.1)
Sodium: 144 mmol/L (ref 135–145)

## 2020-06-10 SURGERY — COLONOSCOPY WITH PROPOFOL
Anesthesia: Monitor Anesthesia Care

## 2020-06-10 MED ORDER — ONDANSETRON HCL 4 MG/2ML IJ SOLN
INTRAMUSCULAR | Status: DC | PRN
  Administered 2020-06-10: 4 mg via INTRAVENOUS

## 2020-06-10 MED ORDER — LIDOCAINE 2% (20 MG/ML) 5 ML SYRINGE
INTRAMUSCULAR | Status: DC | PRN
  Administered 2020-06-10: 40 mg via INTRAVENOUS

## 2020-06-10 MED ORDER — PROPOFOL 10 MG/ML IV BOLUS
INTRAVENOUS | Status: DC | PRN
  Administered 2020-06-10: 10 mg via INTRAVENOUS
  Administered 2020-06-10: 40 mg via INTRAVENOUS

## 2020-06-10 MED ORDER — PROPOFOL 10 MG/ML IV BOLUS
INTRAVENOUS | Status: AC
  Filled 2020-06-10: qty 20

## 2020-06-10 MED ORDER — SPOT INK MARKER SYRINGE KIT
PACK | SUBMUCOSAL | Status: AC
  Filled 2020-06-10: qty 5

## 2020-06-10 MED ORDER — SPOT INK MARKER SYRINGE KIT
PACK | SUBMUCOSAL | Status: DC | PRN
  Administered 2020-06-10: 4 mL via SUBMUCOSAL

## 2020-06-10 MED ORDER — SODIUM CHLORIDE 0.9 % IV SOLN: INTRAVENOUS | Status: DC

## 2020-06-10 MED ORDER — PROPOFOL 500 MG/50ML IV EMUL
INTRAVENOUS | Status: DC | PRN
  Administered 2020-06-10: 135 ug/kg/min via INTRAVENOUS

## 2020-06-10 MED ORDER — PROPOFOL 1000 MG/100ML IV EMUL
INTRAVENOUS | Status: AC
  Filled 2020-06-10: qty 100

## 2020-06-10 MED ORDER — IOHEXOL 300 MG/ML  SOLN
75.0000 mL | Freq: Once | INTRAMUSCULAR | Status: AC | PRN
  Administered 2020-06-10: 75 mL via INTRAVENOUS

## 2020-06-10 MED ORDER — LACTATED RINGERS IV SOLN: INTRAVENOUS | Status: DC

## 2020-06-10 SURGICAL SUPPLY — 25 items

## 2020-06-10 NOTE — Progress Notes (Signed)
[facility] Progress Note  CC:  Hematochezia   Subjective:  She completed 2 doses of Movi prep and Dulcolax. She stated passing black tar colored solid stool then dark watery diarrhea yesterday evening and yellow water per the rectum this morning. She continues to have lower abdominal pain which is well controlled on Hydrocodone. No N/V. No CP or SOB. No family at the bedside.   Objective:  Vital signs in last 24 hours: Temp:  [97.9 F (36.6 C)-98.3 F (36.8 C)] 98.3 F (36.8 C) (01/11 0520) Pulse Rate:  [70-78] 70 (01/11 0520) Resp:  [16-23] 18 (01/11 0520) BP: (132-154)/(55-68) 154/68 (01/11 0520) SpO2:  [96 %-97 %] 97 % (01/11 0520) Last BM Date: 06/09/20   General:   Alert 87 year old female in NAD. Heart: RRR, no murmur.  Pulm: Breath sounds clear throughout.  Abdomen: Soft, mild tenderness central lower abdomen, LLQ < RLQ without rebound or guarding. + BS x 4 quads. RLQ scar intact. Extremities:  Trace LE edema. Neurologic:  Alert and  oriented x4;  grossly normal neurologically. Psych:  Alert and cooperative. Normal mood and affect.  Intake/Output from previous day: 01/10 0701 - 01/11 0700 In: 1622.2 [I.V.:1622.2] Out: 1800 [[id number]] Intake/Output this shift: No intake/output data recorded.  Lab Results: Recent Labs    06/08/20 1211 06/08/20 1717 06/09/20 0033 06/09/20 0723 06/10/20 0551  WBC 7.4 7.0  --   --  7.5  HGB 11.8* 10.8* 10.2* 10.1* 11.8*  HCT 36.4 33.0* 31.6* 31.4* 37.4  PLT 161 148*  --   --  170   BMET Recent Labs    06/08/20 1211 06/10/20 0551  NA 137 144  K 4.2 3.4*  CL 104 115*  CO2 23 19*  GLUCOSE 114* 106*  BUN 14 8  CREATININE 0.61 0.67  CALCIUM 9.0 8.9   LFT Recent Labs    06/08/20 1211  PROT 7.2  ALBUMIN 3.8  AST 37  ALT 25  ALKPHOS 71  BILITOT 0.4   PT/INR No results for input(s): LABPROT, INR in the last 72 hours. Hepatitis Panel No results for input(s): HEPBSAG, HCVAB, HEPAIGM, HEPBIGM in  the last 72 hours.  DG CHEST PORT 1 VIEW  Result Date: 06/08/2020 CLINICAL DATA:  Rales EXAM: PORTABLE CHEST 1 VIEW COMPARISON:  06/18/2017 FINDINGS: Heart and mediastinal contours are within normal limits. No focal opacities or effusions. No acute bony abnormality. Aortic atherosclerosis IMPRESSION: No active disease. Electronically Signed   By: Rolm Baptise M.D.   On: 06/08/2020 21:24   CT Angio Abd/Pel W and/or Wo Contrast  Result Date: 06/08/2020 CLINICAL DATA:  87 year old female with GI bleed. EXAM: CTA ABDOMEN AND PELVIS WITHOUT AND WITH CONTRAST TECHNIQUE: Multidetector CT imaging of the abdomen and pelvis was performed using the standard protocol during bolus administration of intravenous contrast. Multiplanar reconstructed images and MIPs were obtained and reviewed to evaluate the vascular anatomy. CONTRAST:  127mL OMNIPAQUE IOHEXOL 350 MG/ML SOLN COMPARISON:  CT of the chest abdomen pelvis dated 04/30/2019. FINDINGS: VASCULAR Aorta: Moderate atherosclerotic calcification of the aorta. No aneurysmal dilatation or dissection. No periaortic fluid collection or hematoma. Celiac: Patent without evidence of aneurysm, dissection, vasculitis or significant stenosis. SMA: Atherosclerotic calcification of the origin of the SMA. The SMA is patent. Renals: Atherosclerotic calcification of the origin of the renal arteries. The renal arteries are patent. IMA: Patent without evidence of aneurysm, dissection, vasculitis or significant stenosis. Inflow: Patent without evidence of aneurysm, dissection, vasculitis or significant stenosis. Proximal  Outflow: Bilateral common femoral and visualized portions of the superficial and profunda femoral arteries are patent without evidence of aneurysm, dissection, vasculitis or significant stenosis. Veins: The IVC is unremarkable. The SMV, splenic vein, and main portal vein are patent. No portal venous gas. Review of the MIP images confirms the above findings. NON-VASCULAR  Lower chest: The visualized lung bases are clear. There is coronary vascular calcification. No intra-abdominal free air or free fluid. Hepatobiliary: The liver is unremarkable. No intrahepatic biliary dilatation. Cholecystectomy. Retained calcified stone noted in the central CBD. Pancreas: Unremarkable. No pancreatic ductal dilatation or surrounding inflammatory changes. Spleen: Normal in size without focal abnormality. Adrenals/Urinary Tract: The adrenal glands unremarkable. Probable small bilateral parapelvic cyst third the kidneys, visualized ureters, and urinary bladder appear unremarkable. Stomach/Bowel: There is moderate amount of stool throughout the colon. There is sigmoid diverticulosis without active inflammatory changes. There is a focal area of wall thickening and mild haziness involving approximately 4.5 cm segment of the distal transverse colon (coronal 28/6 and axial 52/10). This may represent a focal area of inflammation but concerning for underlying infiltrative neoplasm. Further evaluation with colonoscopy is recommended. There is no bowel obstruction. The appendix is not visualized with certainty. No inflammatory changes identified in the right lower quadrant. Lymphatic: Several mildly rounded and enlarged lymph node adjacent to the inflamed segment of distal transverse colon. The larger lymph node measures approximately 11 mm in short axis. Reproductive: Hysterectomy. No adnexal masses. Other: None Musculoskeletal: Osteopenia with degenerative changes of the spine. Bilateral total hip arthroplasties. Partially visualized breast prostheses. No acute osseous pathology. L3 vertebroplasty. IMPRESSION: 1. Focal area of inflammation or infiltrative neoplasm involving distal transverse colon. Further evaluation with colonoscopy is recommended. 2. Sigmoid diverticulosis.  No bowel obstruction. 3. Aortic Atherosclerosis (ICD10-I70.0). Electronically Signed   By: Anner Crete M.D.   On: 06/08/2020  17:30    Assessment / Plan:  56.  87 year old female admitted to the hospital with lower abdominal pain, hematochezia and anemia.  No prior history of GI bleed.  Hg 11.8 -> 10.2 -> 10.1 (baseine Hg 13,3).  Today Hg 11.8. WBC 7.5. Abdominal/pelvic CT angiogram 06/08/2020 showed a focal area of inflammation/infiltrative neoplasm involving distal transverse colon, sigmoid diverticulosis without evidence of diverticulitis. She reported passing dark black solid stool to watery diarrhea without bright red rectal bleeding after completing the bowel prep. She is afebrile. Hemodynamically stable.  -Proceed with EGD and colonoscopy at 1pm today as scheduled -Monitor H&H closely -Transfuse for hemoglobin less than 7 -NPO -NS IV @ 50cc/hr -Pain management per the hospitalist -Continue pantoprazole 40 mg IV QD  2.  History of adenomatous colon polyps -See plan in  #1  3.  History of GERD. -See plan in  #1  4.  Remote history of breast cancer  5.  History of diastolic CHF. LV EF 60 - 65% per ECHO 07/2019   Principal Problem:   Lower GI bleed Active Problems:   GERD (gastroesophageal reflux disease)   Acute blood loss anemia   HTN (hypertension)   Acute lower GI bleeding     LOS: 1 day   Noralyn Pick  06/10/2020, 8:29 AM

## 2020-06-10 NOTE — Progress Notes (Signed)
PROGRESS NOTE    Crystal Lee  NWG:[id number] DOB: 1933-09-13 DOA: 06/08/2020 PCP: Darreld Mclean, MD   Brief Narrative: Crystal Lee is a 87 y.o. female with medical history significant of hypertension, hyperlipidemia, history of stage I breast cancer in remission, history of melanoma, GERD, osteoporosis, colon polyps. Patient presented secondary to rectal bleeding concerning for a lower GI bleed. GI consulted.   Assessment & Plan:   Principal Problem:   Lower GI bleed Active Problems:   GERD (gastroesophageal reflux disease)   Acute blood loss anemia   HTN (hypertension)   Acute lower GI bleeding   Gastric polyps   Colonic mass   Diverticulosis of colon without hemorrhage   Adenomatous polyp of sigmoid colon   Hematochezia Patient with sigmoid diverticulosis on imaging which may be etiology of bleeding. GI consulted on admission and plan endoscopy evaluation today. No recurrent GI bleeding overnight. -CBC daily -GI recommendations: EGD/Colonoscopy today  Acute blood loss anemia Patient's baseline hemoglobin is around 13. Hemoglobin on admission of 11.8 with continued drop. Hemoglobin of 11.8 today. Secondary to above. Stable.  Colonic inflammation/infiltrate Located in the distal transverse colon concerning for inflammation vs possible neoplasm. -Colonoscopy per GI as mentioned above  GERD -Continue Protonix  Primary hypertension Slightly uncontrolled. Patient is on losartan and Lasix as an outpatient.  Abnormal urinalysis Urine culture obtained on admission. Urine culture with insignificant growth. No significant symptoms concerning for UTI.    DVT prophylaxis: SCDs secondary to active GI bleeding Code Status:   Code Status: Full Code Family Communication: None at bedside Disposition Plan: Discharge home likely in 1-2 days pending GI recommendations   Consultants:   [facility]  Procedures:   None  Antimicrobials:  None     Subjective: No issues with bowel prep. Bowel movements cleared.  Objective: Vitals:   06/09/20 0913 06/09/20 1515 06/10/20 0520 06/10/20 1237  BP: (!) 132/55 (!) 134/58 (!) 154/68 (!) 177/54  Pulse: 75 78 70 76  Resp: (!) 23 16 18  (!) 21  Temp: 97.9 F (36.6 C) 98.3 F (36.8 C) 98.3 F (36.8 C) 98.7 F (37.1 C)  TempSrc: Oral Oral Oral Oral  SpO2: 96% 97% 97% 100%  Weight:      Height:        Intake/Output Summary (Last 24 hours) at 06/10/2020 1436 Last data filed at 06/10/2020 1329 Gross per 24 hour  Intake 2022.16 ml  Output 400 ml  Net 1622.16 ml   Filed Weights   06/08/20 1131 06/08/20 1935  Weight: 97.5 kg 99.9 kg    Examination:  General exam: Appears calm and comfortable Respiratory system: Clear to auscultation. Respiratory effort normal. Cardiovascular system: S1 & S2 heard, RRR. No murmurs, rubs, gallops or clicks. Gastrointestinal system: Abdomen is nondistended, soft and nontender. No organomegaly or masses felt. Normal bowel sounds heard. Central nervous system: Alert and oriented. No focal neurological deficits. Musculoskeletal: No edema. No calf tenderness Skin: No cyanosis. No rashes Psychiatry: Judgement and insight appear normal. Mood & affect appropriate.     Data Reviewed: I have personally reviewed following labs and imaging studies  CBC Lab Results  Component Value Date   WBC 7.5 06/10/2020   RBC 4.02 06/10/2020   HGB 11.8 (L) 06/10/2020   HCT 37.4 06/10/2020   MCV 93.0 06/10/2020   MCH 29.4 06/10/2020   PLT 170 06/10/2020   MCHC 31.6 06/10/2020   RDW 13.0 06/10/2020   LYMPHSABS 2.7 04/07/2020   MONOABS 0.6 04/07/2020   EOSABS  0.2 04/07/2020   BASOSABS 0.0 14/43/1540     Last metabolic panel Lab Results  Component Value Date   NA 144 06/10/2020   K 3.4 (L) 06/10/2020   CL 115 (H) 06/10/2020   CO2 19 (L) 06/10/2020   BUN 8 06/10/2020   CREATININE 0.67 06/10/2020   GLUCOSE 106 (H) 06/10/2020   GFRNONAA >60 06/10/2020    GFRAA 93 02/25/2020   CALCIUM 8.9 06/10/2020   PROT 7.2 06/08/2020   ALBUMIN 3.8 06/08/2020   LABGLOB 2.7 02/09/2017   AGRATIO 1.5 02/09/2017   BILITOT 0.4 06/08/2020   ALKPHOS 71 06/08/2020   AST 37 06/08/2020   ALT 25 06/08/2020   ANIONGAP 10 06/10/2020    CBG (last 3)  No results for input(s): GLUCAP in the last 72 hours.   GFR: Estimated Creatinine Clearance: 62.4 mL/min (by C-G formula based on SCr of 0.67 mg/dL).  Coagulation Profile: No results for input(s): INR, PROTIME in the last 168 hours.  Recent Results (from the past 240 hour(s))  Resp Panel by RT-PCR (Flu A&B, Covid)     Status: None   Collection Time: 06/08/20  4:53 PM   Specimen: Nasopharyngeal(NP) swabs in vial transport medium  Result Value Ref Range Status   SARS Coronavirus 2 by RT PCR NEGATIVE NEGATIVE Final    Comment: (NOTE) SARS-CoV-2 target nucleic acids are NOT DETECTED.  The SARS-CoV-2 RNA is generally detectable in upper respiratory specimens during the acute phase of infection. The lowest concentration of SARS-CoV-2 viral copies this assay can detect is 138 copies/mL. A negative result does not preclude SARS-Cov-2 infection and should not be used as the sole basis for treatment or other patient management decisions. A negative result may occur with  improper specimen collection/handling, submission of specimen other than nasopharyngeal swab, presence of viral mutation(s) within the areas targeted by this assay, and inadequate number of viral copies(<138 copies/mL). A negative result must be combined with clinical observations, patient history, and epidemiological information. The expected result is Negative.  Fact Sheet for Patients:  EntrepreneurPulse.com.au  Fact Sheet for Healthcare Providers:  IncredibleEmployment.be  This test is no t yet approved or cleared by the [place] FDA and  has been authorized for detection and/or diagnosis of  SARS-CoV-2 by FDA under an Emergency Use Authorization (EUA). This EUA will remain  in effect (meaning this test can be used) for the duration of the COVID-19 declaration under Section 564(b)(1) of the Act, 21 U.S.C.section 360bbb-3(b)(1), unless the authorization is terminated  or revoked sooner.       Influenza A by PCR NEGATIVE NEGATIVE Final   Influenza B by PCR NEGATIVE NEGATIVE Final    Comment: (NOTE) The Xpert Xpress SARS-CoV-2/FLU/RSV plus assay is intended as an aid in the diagnosis of influenza from Nasopharyngeal swab specimens and should not be used as a sole basis for treatment. Nasal washings and aspirates are unacceptable for Xpert Xpress SARS-CoV-2/FLU/RSV testing.  Fact Sheet for Patients: EntrepreneurPulse.com.au  Fact Sheet for Healthcare Providers: IncredibleEmployment.be  This test is not yet approved or cleared by the [place] FDA and has been authorized for detection and/or diagnosis of SARS-CoV-2 by FDA under an Emergency Use Authorization (EUA). This EUA will remain in effect (meaning this test can be used) for the duration of the COVID-19 declaration under Section 564(b)(1) of the Act, 21 U.S.C. section 360bbb-3(b)(1), unless the authorization is terminated or revoked.  Performed at [facility], [address], [place], [place] [address]  Culture, Urine     Status: Abnormal   Collection Time: 06/08/20  8:18 PM   Specimen: Urine, Clean Catch  Result Value Ref Range Status   Specimen Description   Final    URINE, CLEAN CATCH Performed at [facility], [place] [address] [place], [place] [address]    Special Requests   Final    NONE Performed at [facility], [place] [address], [place], [place] [address]    Culture (A)  Final    <10,000 COLONIES/mL INSIGNIFICANT GROWTH Performed at [place] [address] [place], [place] [address]    Report  Status 06/10/2020 FINAL  Final        Radiology Studies: DG CHEST PORT 1 VIEW  Result Date: 06/08/2020 CLINICAL DATA:  Rales EXAM: PORTABLE CHEST 1 VIEW COMPARISON:  06/18/2017 FINDINGS: Heart and mediastinal contours are within normal limits. No focal opacities or effusions. No acute bony abnormality. Aortic atherosclerosis IMPRESSION: No active disease. Electronically Signed   By: Rolm Baptise M.D.   On: 06/08/2020 21:24   CT Angio Abd/Pel W and/or Wo Contrast  Result Date: 06/08/2020 CLINICAL DATA:  87 year old female with GI bleed. EXAM: CTA ABDOMEN AND PELVIS WITHOUT AND WITH CONTRAST TECHNIQUE: Multidetector CT imaging of the abdomen and pelvis was performed using the standard protocol during bolus administration of intravenous contrast. Multiplanar reconstructed images and MIPs were obtained and reviewed to evaluate the vascular anatomy. CONTRAST:  155mL OMNIPAQUE IOHEXOL 350 MG/ML SOLN COMPARISON:  CT of the chest abdomen pelvis dated 04/30/2019. FINDINGS: VASCULAR Aorta: Moderate atherosclerotic calcification of the aorta. No aneurysmal dilatation or dissection. No periaortic fluid collection or hematoma. Celiac: Patent without evidence of aneurysm, dissection, vasculitis or significant stenosis. SMA: Atherosclerotic calcification of the origin of the SMA. The SMA is patent. Renals: Atherosclerotic calcification of the origin of the renal arteries. The renal arteries are patent. IMA: Patent without evidence of aneurysm, dissection, vasculitis or significant stenosis. Inflow: Patent without evidence of aneurysm, dissection, vasculitis or significant stenosis. Proximal Outflow: Bilateral common femoral and visualized portions of the superficial and profunda femoral arteries are patent without evidence of aneurysm, dissection, vasculitis or significant stenosis. Veins: The IVC is unremarkable. The SMV, splenic vein, and main portal vein are patent. No portal venous gas. Review of the MIP images  confirms the above findings. NON-VASCULAR Lower chest: The visualized lung bases are clear. There is coronary vascular calcification. No intra-abdominal free air or free fluid. Hepatobiliary: The liver is unremarkable. No intrahepatic biliary dilatation. Cholecystectomy. Retained calcified stone noted in the central CBD. Pancreas: Unremarkable. No pancreatic ductal dilatation or surrounding inflammatory changes. Spleen: Normal in size without focal abnormality. Adrenals/Urinary Tract: The adrenal glands unremarkable. Probable small bilateral parapelvic cyst third the kidneys, visualized ureters, and urinary bladder appear unremarkable. Stomach/Bowel: There is moderate amount of stool throughout the colon. There is sigmoid diverticulosis without active inflammatory changes. There is a focal area of wall thickening and mild haziness involving approximately 4.5 cm segment of the distal transverse colon (coronal 28/6 and axial 52/10). This may represent a focal area of inflammation but concerning for underlying infiltrative neoplasm. Further evaluation with colonoscopy is recommended. There is no bowel obstruction. The appendix is not visualized with certainty. No inflammatory changes identified in the right lower quadrant. Lymphatic: Several mildly rounded and enlarged lymph node adjacent to the inflamed segment of distal transverse colon. The larger lymph node measures approximately 11 mm in short axis. Reproductive: Hysterectomy. No adnexal masses. Other: None  Musculoskeletal: Osteopenia with degenerative changes of the spine. Bilateral total hip arthroplasties. Partially visualized breast prostheses. No acute osseous pathology. L3 vertebroplasty. IMPRESSION: 1. Focal area of inflammation or infiltrative neoplasm involving distal transverse colon. Further evaluation with colonoscopy is recommended. 2. Sigmoid diverticulosis.  No bowel obstruction. 3. Aortic Atherosclerosis (ICD10-I70.0). Electronically Signed   By:  Anner Crete M.D.   On: 06/08/2020 17:30        Scheduled Meds: . [MAR Hold] pantoprazole (PROTONIX) IV  40 mg Intravenous Q24H   Continuous Infusions: . sodium chloride 50 mL/hr at 06/10/20 1010  . lactated ringers       LOS: 1 day     Cordelia Poche, MD [facility] 06/10/2020, 2:36 PM  If 7PM-7AM, please contact night-coverage [email or web address]

## 2020-06-10 NOTE — Transfer of Care (Signed)
Immediate Anesthesia Transfer of Care Note  Patient: Crystal Lee  Procedure(s) Performed: COLONOSCOPY WITH PROPOFOL (N/A ) ESOPHAGOGASTRODUODENOSCOPY (EGD) WITH PROPOFOL (N/A ) BIOPSY SUBMUCOSAL TATTOO INJECTION POLYPECTOMY  Patient Location: PACU and Endoscopy Unit  Anesthesia Type:MAC  Level of Consciousness: awake, alert  and oriented  Airway & Oxygen Therapy: Patient Spontanous Breathing and Patient connected to face mask oxygen  Post-op Assessment: Report given to RN and Post -op Vital signs reviewed and stable  Post vital signs: Reviewed and stable  Last Vitals:  Vitals Value Taken Time  BP    Temp    Pulse 67 06/10/20 1439  Resp    SpO2 100 % 06/10/20 1439  Vitals shown include unvalidated device data.  Last Pain:  Vitals:   06/10/20 1237  TempSrc: Oral  PainSc: 0-No pain         Complications: No complications documented.

## 2020-06-10 NOTE — H&P (View-Only) (Signed)
[facility] Progress Note  CC:  Hematochezia   Subjective:  She completed 2 doses of Movi prep and Dulcolax. She stated passing black tar colored solid stool then dark watery diarrhea yesterday evening and yellow water per the rectum this morning. She continues to have lower abdominal pain which is well controlled on Hydrocodone. No N/V. No CP or SOB. No family at the bedside.   Objective:  Vital signs in last 24 hours: Temp:  [97.9 F (36.6 C)-98.3 F (36.8 C)] 98.3 F (36.8 C) (01/11 0520) Pulse Rate:  [70-78] 70 (01/11 0520) Resp:  [16-23] 18 (01/11 0520) BP: (132-154)/(55-68) 154/68 (01/11 0520) SpO2:  [96 %-97 %] 97 % (01/11 0520) Last BM Date: 06/09/20   General:   Alert 87 year old female in NAD. Heart: RRR, no murmur.  Pulm: Breath sounds clear throughout.  Abdomen: Soft, mild tenderness central lower abdomen, LLQ < RLQ without rebound or guarding. + BS x 4 quads. RLQ scar intact. Extremities:  Trace LE edema. Neurologic:  Alert and  oriented x4;  grossly normal neurologically. Psych:  Alert and cooperative. Normal mood and affect.  Intake/Output from previous day: 01/10 0701 - 01/11 0700 In: 1622.2 [I.V.:1622.2] Out: 1800 [[id number]] Intake/Output this shift: No intake/output data recorded.  Lab Results: Recent Labs    06/08/20 1211 06/08/20 1717 06/09/20 0033 06/09/20 0723 06/10/20 0551  WBC 7.4 7.0  --   --  7.5  HGB 11.8* 10.8* 10.2* 10.1* 11.8*  HCT 36.4 33.0* 31.6* 31.4* 37.4  PLT 161 148*  --   --  170   BMET Recent Labs    06/08/20 1211 06/10/20 0551  NA 137 144  K 4.2 3.4*  CL 104 115*  CO2 23 19*  GLUCOSE 114* 106*  BUN 14 8  CREATININE 0.61 0.67  CALCIUM 9.0 8.9   LFT Recent Labs    06/08/20 1211  PROT 7.2  ALBUMIN 3.8  AST 37  ALT 25  ALKPHOS 71  BILITOT 0.4   PT/INR No results for input(s): LABPROT, INR in the last 72 hours. Hepatitis Panel No results for input(s): HEPBSAG, HCVAB, HEPAIGM, HEPBIGM in  the last 72 hours.  DG CHEST PORT 1 VIEW  Result Date: 06/08/2020 CLINICAL DATA:  [place] EXAM: PORTABLE CHEST 1 VIEW COMPARISON:  06/18/2017 FINDINGS: Heart and mediastinal contours are within normal limits. No focal opacities or effusions. No acute bony abnormality. Aortic atherosclerosis IMPRESSION: No active disease. Electronically Signed   By: Rolm Baptise M.D.   On: 06/08/2020 21:24   CT Angio Abd/Pel W and/or Wo Contrast  Result Date: 06/08/2020 CLINICAL DATA:  87 year old female with GI bleed. EXAM: CTA ABDOMEN AND PELVIS WITHOUT AND WITH CONTRAST TECHNIQUE: Multidetector CT imaging of the abdomen and pelvis was performed using the standard protocol during bolus administration of intravenous contrast. Multiplanar reconstructed images and MIPs were obtained and reviewed to evaluate the vascular anatomy. CONTRAST:  127mL OMNIPAQUE IOHEXOL 350 MG/ML SOLN COMPARISON:  CT of the chest abdomen pelvis dated 04/30/2019. FINDINGS: VASCULAR Aorta: Moderate atherosclerotic calcification of the aorta. No aneurysmal dilatation or dissection. No periaortic fluid collection or hematoma. Celiac: Patent without evidence of aneurysm, dissection, vasculitis or significant stenosis. SMA: Atherosclerotic calcification of the origin of the SMA. The SMA is patent. Renals: Atherosclerotic calcification of the origin of the renal arteries. The renal arteries are patent. IMA: Patent without evidence of aneurysm, dissection, vasculitis or significant stenosis. Inflow: Patent without evidence of aneurysm, dissection, vasculitis or significant stenosis. Proximal  Outflow: Bilateral common femoral and visualized portions of the superficial and profunda femoral arteries are patent without evidence of aneurysm, dissection, vasculitis or significant stenosis. Veins: The IVC is unremarkable. The SMV, splenic vein, and main portal vein are patent. No portal venous gas. Review of the MIP images confirms the above findings. NON-VASCULAR  Lower chest: The visualized lung bases are clear. There is coronary vascular calcification. No intra-abdominal free air or free fluid. Hepatobiliary: The liver is unremarkable. No intrahepatic biliary dilatation. Cholecystectomy. Retained calcified stone noted in the central CBD. Pancreas: Unremarkable. No pancreatic ductal dilatation or surrounding inflammatory changes. Spleen: Normal in size without focal abnormality. Adrenals/Urinary Tract: The adrenal glands unremarkable. Probable small bilateral parapelvic cyst third the kidneys, visualized ureters, and urinary bladder appear unremarkable. Stomach/Bowel: There is moderate amount of stool throughout the colon. There is sigmoid diverticulosis without active inflammatory changes. There is a focal area of wall thickening and mild haziness involving approximately 4.5 cm segment of the distal transverse colon (coronal 28/6 and axial 52/10). This may represent a focal area of inflammation but concerning for underlying infiltrative neoplasm. Further evaluation with colonoscopy is recommended. There is no bowel obstruction. The appendix is not visualized with certainty. No inflammatory changes identified in the right lower quadrant. Lymphatic: Several mildly rounded and enlarged lymph node adjacent to the inflamed segment of distal transverse colon. The larger lymph node measures approximately 11 mm in short axis. Reproductive: Hysterectomy. No adnexal masses. Other: None Musculoskeletal: Osteopenia with degenerative changes of the spine. Bilateral total hip arthroplasties. Partially visualized breast prostheses. No acute osseous pathology. L3 vertebroplasty. IMPRESSION: 1. Focal area of inflammation or infiltrative neoplasm involving distal transverse colon. Further evaluation with colonoscopy is recommended. 2. Sigmoid diverticulosis.  No bowel obstruction. 3. Aortic Atherosclerosis (ICD10-I70.0). Electronically Signed   By: Arash  Radparvar M.D.   On: 06/08/2020  17:30    Assessment / Plan:  1.  86-year-old female admitted to the hospital with lower abdominal pain, hematochezia and anemia.  No prior history of GI bleed.  Hg 11.8 -> 10.2 -> 10.1 (baseine Hg 13,3).  Today Hg 11.8. WBC 7.5. Abdominal/pelvic CT angiogram 06/08/2020 showed a focal area of inflammation/infiltrative neoplasm involving distal transverse colon, sigmoid diverticulosis without evidence of diverticulitis. She reported passing dark black solid stool to watery diarrhea without bright red rectal bleeding after completing the bowel prep. She is afebrile. Hemodynamically stable.  -Proceed with EGD and colonoscopy at 1pm today as scheduled -Monitor H&H closely -Transfuse for hemoglobin less than 7 -NPO -NS IV @ 50cc/hr -Pain management per the hospitalist -Continue pantoprazole 40 mg IV QD  2.  History of adenomatous colon polyps -See plan in  #1  3.  History of GERD. -See plan in  #1  4.  Remote history of breast cancer  5.  History of diastolic CHF. LV EF 60 - 65% per ECHO 07/2019   Principal Problem:   Lower GI bleed Active Problems:   GERD (gastroesophageal reflux disease)   Acute blood loss anemia   HTN (hypertension)   Acute lower GI bleeding     LOS: 1 day   Briston Lax M Kennedy-Smith  06/10/2020, 8:29 AM     

## 2020-06-10 NOTE — Interval H&P Note (Signed)
History and Physical Interval Note:  06/10/2020 12:54 PM  Crystal Lee  has presented today for surgery, with the diagnosis of GI bleed, anemia, abnormal abdominal/pelvic CT angiogram,  GERD.  The various methods of treatment have been discussed with the patient and family. After consideration of risks, benefits and other options for treatment, the patient has consented to  Procedure(s): COLONOSCOPY WITH PROPOFOL (N/A) ESOPHAGOGASTRODUODENOSCOPY (EGD) WITH PROPOFOL (N/A) as a surgical intervention.  The patient's history has been reviewed, patient examined, no change in status, stable for surgery.  I have reviewed the patient's chart and labs.  Questions were answered to the patient's satisfaction.     [name] [name]

## 2020-06-10 NOTE — Anesthesia Preprocedure Evaluation (Signed)
Anesthesia Evaluation  Patient identified by MRN, date of birth, ID band Patient awake    Reviewed: Allergy & Precautions, NPO status , Patient's Chart, lab work & pertinent test results  History of Anesthesia Complications (+) PONV and history of anesthetic complications  Airway Mallampati: II  TM Distance: >3 FB Neck ROM: Full    Dental  (+) Teeth Intact   Pulmonary sleep apnea , former smoker,    Pulmonary exam normal        Cardiovascular hypertension, Pt. on medications  Rhythm:Regular Rate:Normal     Neuro/Psych  Neuromuscular disease    GI/Hepatic GERD  ,  Endo/Other    Renal/GU      Musculoskeletal  (+) Arthritis ,   Abdominal Normal abdominal exam  (+)   Peds  Hematology   Anesthesia Other Findings   Reproductive/Obstetrics                             Anesthesia Physical Anesthesia Plan  ASA: III  Anesthesia Plan: MAC   Post-op Pain Management:    Induction: Intravenous  PONV Risk Score and Plan: 0 and Propofol infusion  Airway Management Planned: Natural Airway and Simple Face Mask  Additional Equipment: None  Intra-op Plan:   Post-operative Plan:   Informed Consent: I have reviewed the patients History and Physical, chart, labs and discussed the procedure including the risks, benefits and alternatives for the proposed anesthesia with the patient or authorized representative who has indicated his/her understanding and acceptance.       Plan Discussed with: CRNA  Anesthesia Plan Comments: (Echo:  1. Left ventricular ejection fraction, by estimation, is 60 to 65%. The  left ventricle has normal function. The left ventricle has no regional  wall motion abnormalities. There is mild left ventricular hypertrophy.  Left ventricular diastolic parameters  were normal.  2. The mitral valve is normal in structure and function. No evidence of  mitral valve  regurgitation. No evidence of mitral stenosis.  3. The aortic valve is normal in structure and function. Aortic valve  regurgitation is not visualized. No aortic stenosis is present.  4. There is mild dilatation of the ascending aorta measuring 38 mm.  5. The inferior vena cava is normal in size with greater than 50%  respiratory variability, suggesting right atrial pressure of 3 mmHg. )        Anesthesia Quick Evaluation

## 2020-06-10 NOTE — Anesthesia Procedure Notes (Signed)
Procedure Name: MAC Date/Time: 06/10/2020 1:29 PM Performed by: Maxwell Caul, CRNA Pre-anesthesia Checklist: Patient identified, Emergency Drugs available, Suction available and Patient being monitored Oxygen Delivery Method: Simple face mask

## 2020-06-10 NOTE — Op Note (Signed)
[facility] Patient Name: Crystal Lee Procedure Date: 06/10/2020 MRN: [id number] Attending MD: Gerrit Heck , MD Date of Birth: [birth year] CSN: [id number] Age: 87 Admit Type: Inpatient Procedure:                Colonoscopy Indications:              Lower abdominal pain, Hematochezia, Acute post                            hemorrhagic anemia, Abnormal CT of the GI tract                           87 yo female presents with acute onset                            hematochezia, described as BRB initially followed                            by darker blood. Admission evaluation notable for                            Hgb 11.8 from baseline ~13, with otherwise normal                            iron indices. CTA on admission was without active                            bleeding, but did demonstrate focal area of                            inflammation vs neoplasm in the distal transverse                            colon. Providers:                Gerrit Heck, MD, Cleda Daub, RN, Elspeth Cho Tech., Technician, Virgia Land, CRNA Referring MD:              Medicines:                Monitored Anesthesia Care Complications:            No immediate complications. Estimated Blood Loss:     Estimated blood loss was minimal. Procedure:                Pre-Anesthesia Assessment:                           - Prior to the procedure, a History and Physical                            was performed, and patient medications and                            allergies were  reviewed. The patient's tolerance of                            previous anesthesia was also reviewed. The risks                            and benefits of the procedure and the sedation                            options and risks were discussed with the patient.                            All questions were answered, and informed consent                            was obtained. Prior  Anticoagulants: The patient has                            taken no previous anticoagulant or antiplatelet                            agents. ASA Grade Assessment: III - A patient with                            severe systemic disease. After reviewing the risks                            and benefits, the patient was deemed in                            satisfactory condition to undergo the procedure.                           After obtaining informed consent, the colonoscope                            was passed under direct vision. Throughout the                            procedure, the patient's blood pressure, pulse, and                            oxygen saturations were monitored continuously. The                            [id number]-[id number] ([id number] Olympus pediatric colonscope                            was introduced through the anus and advanced to the                            the cecum, identified by appendiceal orifice and  ileocecal valve. The colonoscopy was technically                            difficult and complex due to significant looping.                            Successful completion of the procedure was aided by                            applying abdominal pressure. The patient tolerated                            the procedure well. The quality of the bowel                            preparation was good. The ileocecal valve,                            appendiceal orifice, and rectum were photographed. Scope In: 1:53:38 PM Scope Out: 2:31:50 PM Scope Withdrawal Time: 0 hours 20 minutes 29 seconds  Total Procedure Duration: 0 hours 38 minutes 12 seconds  Findings:      The perianal and digital rectal examinations were normal.      A 6 mm polyp was found in the sigmoid colon. The polyp was sessile. The       polyp was removed with a cold snare. Resection and retrieval were       complete. Estimated blood loss was minimal.      A  frond-like/villous and ulcerated partially obstructing large mass was       found in the proximal descending colon, located 60 cm from the anal       verge. The mass was partially circumferential (involving two-thirds of       the lumen circumference). The mass measured four cm in length. No       bleeding was present. This was biopsied with a cold forceps for       histology. Area 2-3 cm proximal and distal to the mass was tattooed with       an injection on both the mesenteric and anti80mesenteric sides. In total,       4 mL of Spot (carbon black) injected.      A few small-mouthed diverticula were found in the sigmoid colon.      The sigmoid colon and ascending colon revealed significantly excessive       looping. Advancing the scope required applying abdominal pressure.      The retroflexed view of the distal rectum and anal verge was normal and       showed no anal or rectal abnormalities. Impression:               - One 6 mm polyp in the sigmoid colon, removed with                            a cold snare. Resected and retrieved.                           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 Biopsied. Tattooed.                           - Diverticulosis in the sigmoid colon.                           - There was significant looping of the colon.                           - The distal rectum and anal verge are normal on                            retroflexion view. Moderate Sedation:      Not Applicable - Patient had care per Anesthesia. Recommendation:           - Return patient to hospital ward for ongoing care.                           - Clear liquid diet today.                           - Continue present medications.                           - Await pathology results.                           - Perform CT scan (computed tomography) of the                            chest with contrast on this admission to complete                             cancer staging.                           - Send CEA.                           - Consult Surgical service today.                           - Consult Oncology service today. Procedure Code(s):        --- Professional ---                           [phone], Colonoscopy, flexible; with removal of                            tumor(s), polyp(s), or other lesion(s) by snare                            technique                           [id number], Colonoscopy, flexible; with directed  submucosal injection(s), any substance                           [id number], 48, Colonoscopy, flexible; with biopsy,                            single or multiple Diagnosis Code(s):        --- Professional ---                           K63.5, Polyp of colon                           C18.6, Malignant neoplasm of descending colon                           K56.690, Other partial intestinal obstruction                           R10.30, Lower abdominal pain, unspecified                           K92.1, Melena (includes Hematochezia)                           D62, Acute posthemorrhagic anemia                           K57.30, Diverticulosis of large intestine without                            perforation or abscess without bleeding                           R93.3, Abnormal findings on diagnostic imaging of                            other parts of digestive tract CPT copyright 2019 [employer]. All rights reserved. The codes documented in this report are preliminary and upon coder review may  be revised to meet current compliance requirements. Gerrit Heck, MD 06/10/2020 2:55:47 PM Number of Addenda: 0

## 2020-06-10 NOTE — Consult Note (Incomplete)
Consult Note  [name] [birth year]  [id number].    Requesting MD: Dr. Gerrit Heck Chief Complaint/Reason for Consult: GI bleeding with descending colon mass  HPI:  Patient is an 87 year old female who presented to the [facility] with complaint of BRBPR on 06/08/20. She reported noting significant blood on paper and in toilet after BM on 1/8. She continued to have this until she came to the ED. She underwent colonoscopy 1/11 that identified a malignant partially obstructing tumor in the proximal descending colon, a 36mm polyp from the sigmoid colon and sigmoid diverticulosis. CEA 6.8. Chest CT showed a slight interval decreased in mediastinal lymph nodes, no new adenopathy.   No hx of GI bleeding previously. She has not had changes in her BM's before 1/8. She notes some lower abdominal pain worse on the left side when reaching above her head for the last 5 weeks that resolves at rest. No current abdominal pain, n/v. She note some unintentional weight loss over the last 2 years since her husband past, but reports this is due to decreased appetite. She is passing flatus. No BM since colonoscopy.   PMH otherwise significant for stage I breast cancer in remission, hx of melanoma, HTN, HLD, GERD, osteoporosis. Past abdominal surgery includes appendectomy, laparoscopic cholecystectomy, abdominal hysterectomy. No blood thinning medications.   Patient lives at home alone. She has a daughter, 3 grandchildren and 5 great grandchildren that live in [place]. She uses a Programmer, multimedia at baseline. She notes prior hx of tobacco use.   ROS: Review of Systems  Constitutional: Positive for weight loss. Negative for chills and fever.  Gastrointestinal: Positive for abdominal pain. Negative for nausea and vomiting.  Genitourinary: Negative for dysuria.  Musculoskeletal: Negative for back pain.  Psychiatric/Behavioral: Negative for substance abuse.  All other systems reviewed and are negative.   Family  History  Problem Relation Age of Onset  . Other Mother        complications from flu  . Other Father        unsure of cause  . Colon cancer Neg Hx     Past Medical History:  Diagnosis Date  . Arthritis   . Cancer (HCC)    HX BREAST CANCER/ SKIN CANCER  . Complication of anesthesia    N/V WITH MORPHINE  . Difficulty sleeping   . Fractured hip ([place])    LEFT - AUG 2016  . GERD (gastroesophageal reflux disease)   . Hyperlipidemia   . Hypertension   . Melanoma ([place])   . Neuropathy   . Nocturia   . Osteopenia   . 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 PONV (postoperative nausea and vomiting)    PT STATES MORPHINE CAUSED N/V  . Rosacea   . Sleep apnea   . Stage 1 breast cancer, ER+, right ([place]) 07/04/2017    Past Surgical History:  Procedure Laterality Date  . [place] STUDY N/A 06/21/2018   Procedure: [place] STUDY;  Surgeon: Lavena Bullion, DO;  Location: [facility];  Service: Gastroenterology;  Laterality: N/A;  with impedance  . ABDOMINAL HYSTERECTOMY  2010  . ANAL RECTAL MANOMETRY N/A 09/19/2019   Procedure: ANO RECTAL MANOMETRY;  Surgeon: Mauri Pole, MD;  Location: [facility];  Service: Endoscopy;  Laterality: N/A;  . APPENDECTOMY  1949  . [place]  . BREAST SURGERY    . CATARACT EXTRACTION Bilateral 2011  . CHOLECYSTECTOMY    . ESOPHAGEAL MANOMETRY  N/A 06/21/2018   Procedure: ESOPHAGEAL MANOMETRY (EM);  Surgeon: Lavena Bullion, DO;  Location: [facility];  Service: Gastroenterology;  Laterality: N/A;  . GALLBLADDER SURGERY  2015  . JOINT REPLACEMENT     RT TOTAL HIP / RT TOTAL KNEE  . MASTECTOMY  1998   BILATERAL   . [place] IMPEDANCE STUDY  06/21/2018   Procedure: [place] IMPEDANCE STUDY;  Surgeon: Lavena Bullion, DO;  Location: [facility];  Service: Gastroenterology;;  . SKIN CANCER EXCISION  2016   RT SIDE OF NOSE  . TONSILLECTOMY  1953  . TOTAL HIP ARTHROPLASTY  2010   RIGHT  . TOTAL HIP ARTHROPLASTY Left  05/09/2015   Procedure: LEFT TOTAL HIP ARTHROPLASTY ANTERIOR APPROACH;  Surgeon: Gaynelle Arabian, MD;  Location: [facility];  Service: Orthopedics;  Laterality: Left;  . TOTAL KNEE ARTHROPLASTY  2001  . TUMOR REMOVAL  2012   ABDOMINAL - NON CANCEROUS    Social History:  reports that she quit smoking about 44 years ago. She has a 10.00 pack-year smoking history. She has never used smokeless tobacco. She reports that she does not drink alcohol and does not use drugs.  Allergies:  Allergies  Allergen Reactions  . Darvon [Propoxyphene] Nausea And Vomiting and Palpitations    Darvocet Causes Sweats  . Adhesive [Tape]   . Denture Adhesive   . Morphine And Related Nausea And Vomiting    Medications Prior to Admission  Medication Sig Dispense Refill  . cyclobenzaprine (FLEXERIL) 5 MG tablet TAKE ONE TABLET BY MOUTH AT BEDTIME AS NEEDED FOR MUSCLE SPASMS (Patient taking differently: Take 5 mg by mouth at bedtime.) 270 tablet 0  . fenofibrate (TRICOR) 48 MG tablet Take 1 tablet (48 mg total) by mouth daily. 90 tablet 3  . HYDROcodone-acetaminophen (NORCO) 10-325 MG tablet Take 1 tablet by mouth every 6 (six) hours as needed. (Patient taking differently: Take 1 tablet by mouth every 6 (six) hours as needed for moderate pain.) 120 tablet 0  . losartan (COZAAR) 25 MG tablet Take 1 tablet (25 mg total) by mouth daily. 90 tablet 3  . Multiple Vitamins-Minerals (MULTIVITAMIN ADULTS) TABS Take 1 tablet by mouth daily.    . ergocalciferol (VITAMIN D2) 1.25 MG (50000 UT) capsule Take 1 capsule (50,000 Units total) by mouth once a week. (Patient not taking: No sig reported) 12 capsule 3  . furosemide (LASIX) 20 MG tablet Take 1 tablet (20 mg total) by mouth daily. 90 tablet 3  . metroNIDAZOLE (METROGEL) 1 % gel Apply topically daily. Use as needed for rosacea (Patient not taking: No sig reported) 60 g 2  . moxifloxacin (VIGAMOX) 0.5 % ophthalmic solution     . potassium chloride (KLOR-CON) 10 MEQ tablet Take 1  tablet (10 mEq total) by mouth daily. 90 tablet 3    Blood pressure (!) 165/59, pulse 76, temperature 98.7 F (37.1 C), temperature source Axillary, resp. rate 19, height 5\' 6"  (1.676 m), weight 99.9 kg, SpO2 98 %. Physical Exam:  General: pleasant, WD, WN elderly female who is laying in bed in NAD HEENT: head is normocephalic, atraumatic.  Sclera are noninjected.  PERRL.  Ears and nose without any masses or lesions.  Mouth is pink and moist Heart: regular, rate, and rhythm.  Normal s1,s2. No obvious murmurs, gallops, or rubs noted.  Palpable radial and pedal pulses bilaterally Lungs: CTAB, no wheezes, rhonchi, or rales noted.  Respiratory effort nonlabored Abd: soft, ND, mild lower abdominal tenderness worse on the LLQ without peritonitis. +  BS, no masses, hernias, or organomegaly. Prior scars well healed. MS: all 4 extremities are symmetrical with no cyanosis, clubbing, or edema. Skin: warm and dry with no masses, lesions, or rashes Neuro: Cranial nerves 2-12 grossly intact, speech is normal, MAE's. Follows commands. Gait not assessed. Psych: A&Ox4 with an appropriate affect.   Results for orders placed or performed during the hospital encounter of 06/08/20 (from the past 48 hour(s))  Occult blood card to lab, stool     Status: Abnormal   Collection Time: 06/08/20  4:53 PM  Result Value Ref Range   Fecal Occult Bld POSITIVE (A) NEGATIVE    Comment: Performed at [facility], [address] [place], [place] [address]  Resp Panel by RT-PCR (Flu A&B, Covid)     Status: None   Collection Time: 06/08/20  4:53 PM   Specimen: Nasopharyngeal(NP) swabs in vial transport medium  Result Value Ref Range   SARS Coronavirus 2 by RT PCR NEGATIVE NEGATIVE    Comment: (NOTE) SARS-CoV-2 target nucleic acids are NOT DETECTED.  The SARS-CoV-2 RNA is generally detectable in upper respiratory specimens during the acute phase of infection. The lowest concentration of SARS-CoV-2 viral copies  this assay can detect is 138 copies/mL. A negative result does not preclude SARS-Cov-2 infection and should not be used as the sole basis for treatment or other patient management decisions. A negative result may occur with  improper specimen collection/handling, submission of specimen other than nasopharyngeal swab, presence of viral mutation(s) within the areas targeted by this assay, and inadequate number of viral copies(<138 copies/mL). A negative result must be combined with clinical observations, patient history, and epidemiological information. The expected result is Negative.  Fact Sheet for Patients:  EntrepreneurPulse.com.au  Fact Sheet for Healthcare Providers:  IncredibleEmployment.be  This test is no t yet approved or cleared by the [place] FDA and  has been authorized for detection and/or diagnosis of SARS-CoV-2 by FDA under an Emergency Use Authorization (EUA). This EUA will remain  in effect (meaning this test can be used) for the duration of the COVID-19 declaration under Section 564(b)(1) of the Act, 21 U.S.C.section 360bbb-3(b)(1), unless the authorization is terminated  or revoked sooner.       Influenza A by PCR NEGATIVE NEGATIVE   Influenza B by PCR NEGATIVE NEGATIVE    Comment: (NOTE) The Xpert Xpress SARS-CoV-2/FLU/RSV plus assay is intended as an aid in the diagnosis of influenza from Nasopharyngeal swab specimens and should not be used as a sole basis for treatment. Nasal washings and aspirates are unacceptable for Xpert Xpress SARS-CoV-2/FLU/RSV testing.  Fact Sheet for Patients: EntrepreneurPulse.com.au  Fact Sheet for Healthcare Providers: IncredibleEmployment.be  This test is not yet approved or cleared by the [place] FDA and has been authorized for detection and/or diagnosis of SARS-CoV-2 by FDA under an Emergency Use Authorization (EUA). This EUA will  remain in effect (meaning this test can be used) for the duration of the COVID-19 declaration under Section 564(b)(1) of the Act, 21 U.S.C. section 360bbb-3(b)(1), unless the authorization is terminated or revoked.  Performed at [facility], [address] [place], [place] [address]   Urinalysis, Routine w reflex microscopic Urine, Clean Catch     Status: Abnormal   Collection Time: 06/08/20  4:53 PM  Result Value Ref Range   Color, Urine YELLOW YELLOW   APPearance CLEAR CLEAR   Specific Gravity, Urine 1.020 1.005 - 1.030   pH 6.0 5.0 - 8.0  Glucose, UA NEGATIVE NEGATIVE mg/dL   Hgb urine dipstick NEGATIVE NEGATIVE   Bilirubin Urine NEGATIVE NEGATIVE   Ketones, ur NEGATIVE NEGATIVE mg/dL   Protein, ur NEGATIVE NEGATIVE mg/dL   Nitrite NEGATIVE NEGATIVE   Leukocytes,Ua TRACE (A) NEGATIVE    Comment: Performed at [facility], [address] [place], [place] [address]  Urinalysis, Microscopic (reflex)     Status: Abnormal   Collection Time: 06/08/20  4:53 PM  Result Value Ref Range   RBC / HPF NONE SEEN 0 - 5 RBC/hpf   WBC, UA 6-10 0 - 5 WBC/hpf   Bacteria, UA MANY (A) NONE SEEN   Squamous Epithelial / LPF 0-5 0 - 5   Mucus PRESENT     Comment: Performed at [facility], [address] [place], [place] [address]  CBC     Status: Abnormal   Collection Time: 06/08/20  5:17 PM  Result Value Ref Range   WBC 7.0 4.0 - 10.5 K/uL   RBC 3.69 (L) 3.87 - 5.11 MIL/uL   Hemoglobin 10.8 (L) 12.0 - 15.0 g/dL   HCT 33.0 (L) 36.0 - 46.0 %   MCV 89.4 80.0 - 100.0 fL   MCH 29.3 26.0 - 34.0 pg   MCHC 32.7 30.0 - 36.0 g/dL   RDW 12.9 11.5 - 15.5 %   Platelets 148 (L) 150 - 400 K/uL   nRBC 0.0 0.0 - 0.2 %    Comment: Performed at [facility], [address] [place], [place] [address]  Culture, Urine     Status: Abnormal   Collection Time: 06/08/20  8:18 PM   Specimen: Urine, Clean Catch  Result Value Ref Range   Specimen Description       URINE, CLEAN CATCH Performed at [facility], [place] [address], [place], [place] [address]    Special Requests      NONE Performed at [facility], [place] [address] [place], [place] [address]    Culture (A)     <10,000 COLONIES/mL INSIGNIFICANT GROWTH Performed at [place] [address] [place], [place] [address]    Report Status 06/10/2020 FINAL   Vitamin B12     Status: None   Collection Time: 06/09/20 12:33 AM  Result Value Ref Range   Vitamin B-12 659 180 - 914 pg/mL    Comment: (NOTE) This assay is not validated for testing neonatal or myeloproliferative syndrome specimens for Vitamin B12 levels. Performed at [facility], [place] [address], [place], [place] [address]   Folate     Status: None   Collection Time: 06/09/20 12:33 AM  Result Value Ref Range   Folate >24.8 >5.9 ng/mL    Comment: Performed at [facility], [place] [address] [place], [place] [address]  Iron and TIBC     Status: None   Collection Time: 06/09/20 12:33 AM  Result Value Ref Range   Iron 49 28 - 170 ug/dL   TIBC 320 250 - 450 ug/dL   Saturation Ratios 15 10.4 - 31.8 %   UIBC 271 ug/dL    Comment: Performed at [facility], [place] [address], [place], [place] [address]  Ferritin     Status: None   Collection Time: 06/09/20 12:33 AM  Result Value Ref Range   Ferritin 45 11 - 307 ng/mL    Comment: Performed at [facility], [address] [place],  [place] [address]  Reticulocytes     Status: Abnormal   Collection Time: 06/09/20 12:33 AM  Result Value Ref Range   Retic Ct Pct 1.6 0.4 - 3.1 %   RBC. 3.54 (L) 3.87 - 5.11 MIL/uL   Retic Count, Absolute 56.3 19.0 - 186.0 K/uL   Immature Retic Fract 11.4 2.3 - 15.9 %    Comment: Performed at [facility], [place] [address], [place], [place] [address]  Hemoglobin and hematocrit, blood     Status: Abnormal   Collection  Time: 06/09/20 12:33 AM  Result Value Ref Range   Hemoglobin 10.2 (L) 12.0 - 15.0 g/dL   HCT 31.6 (L) 36.0 - 46.0 %    Comment: Performed at [facility], [place] [address] [place], [place] [address]  Type and screen [place]     Status: None   Collection Time: 06/09/20 12:33 AM  Result Value Ref Range   ABO/RH(D) O POS    Antibody Screen NEG    Sample Expiration      06/12/2020,2359 Performed at [facility], [place] [address], [place], [place] [address]   Hemoglobin and hematocrit, blood     Status: Abnormal   Collection Time: 06/09/20  7:23 AM  Result Value Ref Range   Hemoglobin 10.1 (L) 12.0 - 15.0 g/dL   HCT 31.4 (L) 36.0 - 46.0 %    Comment: Performed at [facility], [place] [address] [place], [place] [id number]  Basic metabolic panel Once     Status: Abnormal   Collection Time: 06/10/20  5:51 AM  Result Value Ref Range   Sodium 144 135 - 145 mmol/L   Potassium 3.4 (L) 3.5 - 5.1 mmol/L   Chloride 115 (H) 98 - 111 mmol/L   CO2 19 (L) 22 - 32 mmol/L   Glucose, Bld 106 (H) 70 - 99 mg/dL    Comment: Glucose reference range applies only to samples taken after fasting for at least 8 hours.   BUN 8 8 - 23 mg/dL   Creatinine, Ser 0.67 0.44 - 1.00 mg/dL   Calcium 8.9 8.9 - 10.3 mg/dL   GFR, Estimated >60 >60 mL/min    Comment: (NOTE) Calculated using the CKD-EPI Creatinine Equation (2021)    Anion gap 10 5 - 15    Comment: Performed at [facility], [place] [address] [place], [place] [address]  CBC     Status: Abnormal   Collection Time: 06/10/20  5:51 AM  Result Value Ref Range   WBC 7.5 4.0 - 10.5 K/uL   RBC 4.02 3.87 - 5.11 MIL/uL   Hemoglobin 11.8 (L) 12.0 - 15.0 g/dL   HCT 37.4 36.0 - 46.0 %   MCV 93.0 80.0 - 100.0 fL   MCH 29.4 26.0 - 34.0 pg   MCHC 31.6 30.0 - 36.0 g/dL   RDW 13.0 11.5 - 15.5 %   Platelets 170 150 - 400 K/uL   nRBC 0.0 0.0 - 0.2 %    Comment: Performed at  [facility], [place] [address] [place], [place] [address]   DG CHEST PORT 1 VIEW  Result Date: 06/08/2020 CLINICAL DATA:  [place] EXAM: PORTABLE CHEST 1 VIEW COMPARISON:  06/18/2017 FINDINGS: Heart and mediastinal contours are within normal limits. No focal opacities or effusions. No acute bony abnormality. Aortic atherosclerosis IMPRESSION: No active disease. Electronically Signed   By: Rolm Baptise M.D.   On: 06/08/2020 21:24   CT  Angio Abd/Pel W and/or Wo Contrast  Result Date: 06/08/2020 CLINICAL DATA:  87 year old female with GI bleed. EXAM: CTA ABDOMEN AND PELVIS WITHOUT AND WITH CONTRAST TECHNIQUE: Multidetector CT imaging of the abdomen and pelvis was performed using the standard protocol during bolus administration of intravenous contrast. Multiplanar reconstructed images and MIPs were obtained and reviewed to evaluate the vascular anatomy. CONTRAST:  183mL OMNIPAQUE IOHEXOL 350 MG/ML SOLN COMPARISON:  CT of the chest abdomen pelvis dated 04/30/2019. FINDINGS: VASCULAR Aorta: Moderate atherosclerotic calcification of the aorta. No aneurysmal dilatation or dissection. No periaortic fluid collection or hematoma. Celiac: Patent without evidence of aneurysm, dissection, vasculitis or significant stenosis. SMA: Atherosclerotic calcification of the origin of the SMA. The SMA is patent. Renals: Atherosclerotic calcification of the origin of the renal arteries. The renal arteries are patent. IMA: Patent without evidence of aneurysm, dissection, vasculitis or significant stenosis. Inflow: Patent without evidence of aneurysm, dissection, vasculitis or significant stenosis. Proximal Outflow: Bilateral common femoral and visualized portions of the superficial and profunda femoral arteries are patent without evidence of aneurysm, dissection, vasculitis or significant stenosis. Veins: The IVC is unremarkable. The SMV, splenic vein, and main portal vein are patent. No portal venous gas. Review of the  MIP images confirms the above findings. NON-VASCULAR Lower chest: The visualized lung bases are clear. There is coronary vascular calcification. No intra-abdominal free air or free fluid. Hepatobiliary: The liver is unremarkable. No intrahepatic biliary dilatation. Cholecystectomy. Retained calcified stone noted in the central CBD. Pancreas: Unremarkable. No pancreatic ductal dilatation or surrounding inflammatory changes. Spleen: Normal in size without focal abnormality. Adrenals/Urinary Tract: The adrenal glands unremarkable. Probable small bilateral parapelvic cyst third the kidneys, visualized ureters, and urinary bladder appear unremarkable. Stomach/Bowel: There is moderate amount of stool throughout the colon. There is sigmoid diverticulosis without active inflammatory changes. There is a focal area of wall thickening and mild haziness involving approximately 4.5 cm segment of the distal transverse colon (coronal 28/6 and axial 52/10). This may represent a focal area of inflammation but concerning for underlying infiltrative neoplasm. Further evaluation with colonoscopy is recommended. There is no bowel obstruction. The appendix is not visualized with certainty. No inflammatory changes identified in the right lower quadrant. Lymphatic: Several mildly rounded and enlarged lymph node adjacent to the inflamed segment of distal transverse colon. The larger lymph node measures approximately 11 mm in short axis. Reproductive: Hysterectomy. No adnexal masses. Other: None Musculoskeletal: Osteopenia with degenerative changes of the spine. Bilateral total hip arthroplasties. Partially visualized breast prostheses. No acute osseous pathology. L3 vertebroplasty. IMPRESSION: 1. Focal area of inflammation or infiltrative neoplasm involving distal transverse colon. Further evaluation with colonoscopy is recommended. 2. Sigmoid diverticulosis.  No bowel obstruction. 3. Aortic Atherosclerosis (ICD10-I70.0). Electronically  Signed   By: Anner Crete M.D.   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  - CEA 6.8.  - Chest CT showed a slight interval decreased in mediastinal lymph nodes, no new adenopathy - Dr. Marin Olp of oncology has seen her today. Believes she would be a good candidate for surgical resection.  - We discussed surgery as an inpatient vs awaiting final biopsy results and having her follow up with a colorectal surgeon as an outpatient. Patient  states she would like surgery during this admission.  -The anatomy and physiology of the GI tract was discussed at length with the patient. The planned procedure and material risks including, but not limited to: anesthesia (MI, CVA, death, prolonged intubation) pain, bleeding, infection, damage to surrounding structures (blood vessels/nerves/viscus/organs/ureter) and need for a stoma. Additionally, we discussed typical postoperative expectations and the recovery process. We discussed the possibility of post-operative PNA, IAA and leak from anastomosis. We discussed the possibility that she may need a SNF for a period of time depending on how she does with PT after surgery. The patient's questions were answered to their satisfaction, they voiced understanding and elected to proceed with surgery.  - Will plan for Laparoscopic Assisted Partial Colectomy tomorrow vs Friday depending on OR availability   FEN: Keep on CLD. NPO at midnight  VTE: SCDs, chemical prophylaxis on hold for GI bleed ID - Ancef periop Foley - None Follow-Up: TBD  Jillyn Ledger, [facility] 06/11/2020, 11:31 AM Please see Amion for pager number during day hours  7:00am-4:30pm

## 2020-06-10 NOTE — Op Note (Signed)
[facility] Patient Name: Crystal Lee Procedure Date: 06/10/2020 MRN: [id number] Attending MD: Gerrit Heck , MD Date of Birth: [birth year] CSN: [id number] Age: 87 Admit Type: Inpatient Procedure:                Upper GI endoscopy Indications:              Lower abdominal pain, Acute post hemorrhagic                            anemia, Hematochezia Providers:                Gerrit Heck, MD, Cleda Daub, RN, Elspeth Cho Tech., Technician, Virgia Land, CRNA Referring MD:              Medicines:                Monitored Anesthesia Care Complications:            No immediate complications. Estimated Blood Loss:     Estimated blood loss was minimal. Procedure:                Pre-Anesthesia Assessment:                           - Prior to the procedure, a History and Physical                            was performed, and patient medications and                            allergies were reviewed. The patient's tolerance of                            previous anesthesia was also reviewed. The risks                            and benefits of the procedure and the sedation                            options and risks were discussed with the patient.                            All questions were answered, and informed consent                            was obtained. Prior Anticoagulants: The patient has                            taken no previous anticoagulant or antiplatelet                            agents. ASA Grade Assessment: III - A patient with  severe systemic disease. After reviewing the risks                            and benefits, the patient was deemed in                            satisfactory condition to undergo the procedure.                           After obtaining informed consent, the endoscope was                            passed under direct vision. Throughout the                             procedure, the patient's blood pressure, pulse, and                            oxygen saturations were monitored continuously. The                            [id number] [id number]) was introduced through the                            mouth, and advanced to the second part of duodenum.                            The upper GI endoscopy was accomplished without                            difficulty. The patient tolerated the procedure                            well. Scope In: Scope Out: Findings:      The examined esophagus was normal.      Multiple small sessile polyps with no bleeding and no stigmata of recent       bleeding were found in the gastric fundus and in the gastric body.       Several of these polyps were removed with a cold biopsy forceps for       histologic representative evaluation. Resection and retrieval were       complete. Estimated blood loss was minimal.      The incisura, gastric antrum and pylorus were normal.      The examined duodenum was normal. Impression:               - Normal esophagus.                           - Multiple gastric polyps. Resected and retrieved.                           - Normal incisura, antrum and pylorus.                           - Normal examined  duodenum.                           - No areas of active bleeding or high grade                            stigmata of bleeding on this study. Moderate Sedation:      Not Applicable - Patient had care per Anesthesia. Recommendation:           - Will follow-up on pathology results.                           - Perform a colonoscopy today. Additional                            recommendations pending colonoscopy findings. Procedure Code(s):        --- Professional ---                           [phone], Esophagogastroduodenoscopy, flexible,                            transoral; with biopsy, single or multiple Diagnosis Code(s):        --- Professional ---                           K31.7, Polyp of  stomach and duodenum                           R10.30, Lower abdominal pain, unspecified                           D62, Acute posthemorrhagic anemia                           K92.1, Melena (includes Hematochezia) CPT copyright 2019 American Medical Association. All rights reserved. The codes documented in this report are preliminary and upon coder review may  be revised to meet current compliance requirements. Gerrit Heck, MD 06/10/2020 2:41:34 PM Number of Addenda: 0

## 2020-06-11 ENCOUNTER — Telehealth: Payer: Self-pay | Admitting: Gastroenterology

## 2020-06-11 DIAGNOSIS — D62 Acute posthemorrhagic anemia: Secondary | ICD-10-CM | POA: Diagnosis not present

## 2020-06-11 DIAGNOSIS — K922 Gastrointestinal hemorrhage, unspecified: Secondary | ICD-10-CM

## 2020-06-11 DIAGNOSIS — K629 Disease of anus and rectum, unspecified: Secondary | ICD-10-CM | POA: Diagnosis not present

## 2020-06-11 DIAGNOSIS — Z853 Personal history of malignant neoplasm of breast: Secondary | ICD-10-CM | POA: Diagnosis not present

## 2020-06-11 DIAGNOSIS — R970 Elevated carcinoembryonic antigen [CEA]: Secondary | ICD-10-CM

## 2020-06-11 DIAGNOSIS — I10 Essential (primary) hypertension: Secondary | ICD-10-CM | POA: Diagnosis not present

## 2020-06-11 DIAGNOSIS — Z85820 Personal history of malignant melanoma of skin: Secondary | ICD-10-CM

## 2020-06-11 DIAGNOSIS — C186 Malignant neoplasm of descending colon: Secondary | ICD-10-CM

## 2020-06-11 DIAGNOSIS — Z87891 Personal history of nicotine dependence: Secondary | ICD-10-CM

## 2020-06-11 DIAGNOSIS — K6389 Other specified diseases of intestine: Secondary | ICD-10-CM

## 2020-06-11 DIAGNOSIS — K219 Gastro-esophageal reflux disease without esophagitis: Secondary | ICD-10-CM | POA: Diagnosis not present

## 2020-06-11 LAB — CBC
HCT: 31.2 % — ABNORMAL LOW (ref 36.0–46.0)
Hemoglobin: 9.8 g/dL — ABNORMAL LOW (ref 12.0–15.0)
MCH: 29.7 pg (ref 26.0–34.0)
MCHC: 31.4 g/dL (ref 30.0–36.0)
MCV: 94.5 fL (ref 80.0–100.0)
Platelets: 131 10*3/uL — ABNORMAL LOW (ref 150–400)
RBC: 3.3 MIL/uL — ABNORMAL LOW (ref 3.87–5.11)
RDW: 13 % (ref 11.5–15.5)
WBC: 7.7 10*3/uL (ref 4.0–10.5)
nRBC: 0 % (ref 0.0–0.2)

## 2020-06-11 LAB — BASIC METABOLIC PANEL
Anion gap: 8 (ref 5–15)
BUN: 5 mg/dL — ABNORMAL LOW (ref 8–23)
CO2: 20 mmol/L — ABNORMAL LOW (ref 22–32)
Calcium: 8 mg/dL — ABNORMAL LOW (ref 8.9–10.3)
Chloride: 114 mmol/L — ABNORMAL HIGH (ref 98–111)
Creatinine, Ser: 0.55 mg/dL (ref 0.44–1.00)
GFR, Estimated: >60 mL/min (ref >60)
Glucose, Bld: 97 mg/dL (ref 70–99)
Potassium: 3 mmol/L — ABNORMAL LOW (ref 3.5–5.1)
Sodium: 142 mmol/L (ref 135–145)

## 2020-06-11 LAB — CEA: CEA: 6.8 ng/mL — ABNORMAL HIGH (ref 0.0–4.7)

## 2020-06-11 LAB — SURGICAL PATHOLOGY

## 2020-06-11 MED ORDER — CHLORHEXIDINE GLUCONATE CLOTH 2 % EX PADS
6.0000 | MEDICATED_PAD | Freq: Once | CUTANEOUS | Status: AC
  Administered 2020-06-12: 6 via TOPICAL

## 2020-06-11 MED ORDER — SODIUM CHLORIDE 0.9 % IV SOLN: INTRAVENOUS | Status: DC

## 2020-06-11 MED ORDER — ENSURE PRE-SURGERY PO LIQD
296.0000 mL | Freq: Once | ORAL | Status: DC
  Filled 2020-06-11: qty 296

## 2020-06-11 MED ORDER — FENOFIBRATE 54 MG PO TABS
54.0000 mg | ORAL_TABLET | Freq: Every day | ORAL | Status: DC
  Administered 2020-06-11 – 2020-06-18 (×7): 54 mg via ORAL
  Filled 2020-06-11 (×8): qty 1

## 2020-06-11 MED ORDER — ALVIMOPAN 12 MG PO CAPS
12.0000 mg | ORAL_CAPSULE | ORAL | Status: AC
  Administered 2020-06-12: 12 mg via ORAL
  Filled 2020-06-11: qty 1

## 2020-06-11 MED ORDER — POTASSIUM CHLORIDE CRYS ER 20 MEQ PO TBCR
40.0000 meq | EXTENDED_RELEASE_TABLET | ORAL | Status: AC
  Administered 2020-06-11 (×2): 40 meq via ORAL
  Filled 2020-06-11 (×2): qty 2

## 2020-06-11 MED ORDER — ENOXAPARIN SODIUM 40 MG/0.4ML ~~LOC~~ SOLN
40.0000 mg | SUBCUTANEOUS | Status: AC
  Administered 2020-06-12: 40 mg via SUBCUTANEOUS
  Filled 2020-06-11: qty 0.4

## 2020-06-11 MED ORDER — LOSARTAN POTASSIUM 50 MG PO TABS
25.0000 mg | ORAL_TABLET | Freq: Every day | ORAL | Status: DC
  Administered 2020-06-11 – 2020-06-18 (×7): 25 mg via ORAL
  Filled 2020-06-11 (×8): qty 1

## 2020-06-11 MED ORDER — CHLORHEXIDINE GLUCONATE CLOTH 2 % EX PADS
6.0000 | MEDICATED_PAD | Freq: Once | CUTANEOUS | Status: AC
  Administered 2020-06-11: 6 via TOPICAL

## 2020-06-11 MED ORDER — SODIUM CHLORIDE 0.9 % IV SOLN
2.0000 g | INTRAVENOUS | Status: AC
  Administered 2020-06-12: 2 g via INTRAVENOUS
  Filled 2020-06-11: qty 2

## 2020-06-11 MED ORDER — SODIUM CHLORIDE 0.9 % IV SOLN
510.0000 mg | Freq: Once | INTRAVENOUS | Status: AC
  Administered 2020-06-11: 510 mg via INTRAVENOUS
  Filled 2020-06-11: qty 510

## 2020-06-11 MED ORDER — ENSURE PRE-SURGERY PO LIQD
592.0000 mL | Freq: Once | ORAL | Status: AC
  Administered 2020-06-11: 592 mL via ORAL
  Filled 2020-06-11: qty 592

## 2020-06-11 NOTE — Anesthesia Postprocedure Evaluation (Signed)
Anesthesia Post Note  Patient: Crystal Lee  Procedure(s) Performed: COLONOSCOPY WITH PROPOFOL (N/A ) ESOPHAGOGASTRODUODENOSCOPY (EGD) WITH PROPOFOL (N/A ) BIOPSY SUBMUCOSAL TATTOO INJECTION POLYPECTOMY     Patient location during evaluation: PACU Anesthesia Type: MAC Level of consciousness: awake and alert Pain management: pain level controlled Vital Signs Assessment: post-procedure vital signs reviewed and stable Respiratory status: spontaneous breathing, nonlabored ventilation, respiratory function stable and patient connected to nasal cannula oxygen Cardiovascular status: stable and blood pressure returned to baseline Postop Assessment: no apparent nausea or vomiting Anesthetic complications: no   No complications documented.             Effie Berkshire

## 2020-06-11 NOTE — Progress Notes (Signed)
PROGRESS NOTE    Crystal Lee  IRC:[id number] DOB: 1934-05-21 DOA: 06/08/2020 PCP: Darreld Mclean, MD   Brief Narrative: Crystal Lee is a 87 y.o. female with medical history significant of hypertension, hyperlipidemia, history of stage I breast cancer in remission, history of melanoma, GERD, osteoporosis, colon polyps. Patient presented secondary to rectal bleeding concerning for a lower GI bleed. GI consulted.   Assessment & Plan:   Principal Problem:   Lower GI bleed Active Problems:   GERD (gastroesophageal reflux disease)   Acute blood loss anemia   HTN (hypertension)   Acute lower GI bleeding   Gastric polyps   Colonic mass   Diverticulosis of colon without hemorrhage   Adenomatous polyp of sigmoid colon   Hematochezia Patient with sigmoid diverticulosis on imaging which may be etiology of bleeding. GI consulted on admission and plan endoscopy evaluation today. No recurrent bleeding. Malignant mass and diverticulosis seen on colonoscopy. -CBC daily  Acute blood loss anemia Patient's baseline hemoglobin is around 13. Hemoglobin on admission of 11.8 with continued drop. Hemoglobin of 9.8 today. Secondary to above. -CBC as mentioned above  Malignant colonic mass Located in the distal transverse colon concerning for inflammation vs possible neoplasm. Colonoscopy performed on 1/11 significant for a malignant, partially obstructing descending colon mass. Biopsies obtained. General surgery and medical oncology consulted. CT chest obtained to complete cancer screen without remote masses identified. -GI recommendations: CEA, repeat colonoscopy in 1 year s/p resection -General surgery recommendations: plan for laparoscopic partial colectomy inpatient -Biopsy pending  GERD -Continue Protonix  Primary hypertension Slightly uncontrolled. Patient is on losartan and Lasix as an outpatient.  Abnormal urinalysis Urine culture obtained on admission. Urine culture with  insignificant growth. No significant symptoms concerning for UTI.  Remote history of right breast cancer Patient follows with Dr. Marin Olp   DVT prophylaxis: SCDs secondary to active GI bleeding Code Status:   Code Status: Full Code Family Communication: None at bedside Disposition Plan: Discharge home likely in several days pending general surgery recommendations   Consultants:   [facility]  General surgery  Medical oncology  Procedures:   COLONOSCOPY (06/10/2020) Impression:               - One 6 mm polyp in the sigmoid colon, removed with                            a cold snare. Resected and retrieved.                           - Malignant partially obstructing tumor in the                            proximal descending colon. Biopsied. Tattooed.                           - Diverticulosis in the sigmoid colon.                           - There was significant looping of the colon.                           - The distal rectum and anal verge are normal on  retroflexion view.  Recommendation:           - Return patient to hospital ward for ongoing care.                           - Clear liquid diet today.                           - Continue present medications.                           - Await pathology results.                           - Perform CT scan (computed tomography) of the                            chest with contrast on this admission to complete                            cancer staging.                           - Send CEA.                           - Consult Surgical service today.                           - Consult Oncology service today.  UPPER ENDOSCOPY (06/10/2020) Impression:               - Normal esophagus.                           - Multiple gastric polyps. Resected and retrieved.                           - Normal incisura, antrum and pylorus.                           - Normal examined duodenum.                            - No areas of active bleeding or high grade                            stigmata of bleeding on this study.  Recommendation:           - Will follow-up on pathology results.                           - Perform a colonoscopy today. Additional                            recommendations pending colonoscopy findings.  Antimicrobials:  None    Subjective: No issues overnight. Eager to have surgery.  Objective: Vitals:   06/10/20 1500 06/10/20 1510 06/10/20 2131 06/11/20 0603  BP: Marland Kitchen)  165/59 (!) 144/57 (!) 132/54 (!) 139/55  Pulse: 76 66 (!) 59 61  Resp: 19 20 14 16   Temp:   98.4 F (36.9 C) 97.9 F (36.6 C)  TempSrc:   Oral Oral  SpO2: 98% 99% 96% 98%  Weight:      Height:        Intake/Output Summary (Last 24 hours) at 06/11/2020 0734 Last data filed at 06/10/2020 1700 Gross per 24 hour  Intake 1700 ml  Output -  Net 1700 ml   Filed Weights   06/08/20 1131 06/08/20 1935  Weight: 97.5 kg 99.9 kg    Examination:  General exam: Appears calm and comfortable Respiratory system: Clear to auscultation. Respiratory effort normal. Cardiovascular system: S1 & S2 heard, RRR. No murmurs, rubs, gallops or clicks. Gastrointestinal system: Abdomen is nondistended, soft and nontender. No organomegaly or masses felt. Normal bowel sounds heard. Central nervous system: Alert and oriented. No focal neurological deficits. Musculoskeletal: No edema. No calf tenderness Skin: No cyanosis. No rashes Psychiatry: Judgement and insight appear normal. Mood & affect appropriate.     Data Reviewed: I have personally reviewed following labs and imaging studies  CBC Lab Results  Component Value Date   WBC 7.7 06/11/2020   RBC 3.30 (L) 06/11/2020   HGB 9.8 (L) 06/11/2020   HCT 31.2 (L) 06/11/2020   MCV 94.5 06/11/2020   MCH 29.7 06/11/2020   PLT 131 (L) 06/11/2020   MCHC 31.4 06/11/2020   RDW 13.0 06/11/2020   LYMPHSABS 2.7 04/07/2020   MONOABS 0.6 04/07/2020   EOSABS  0.2 04/07/2020   BASOSABS 0.0 0000000     Last metabolic panel Lab Results  Component Value Date   NA 142 06/11/2020   K 3.0 (L) 06/11/2020   CL 114 (H) 06/11/2020   CO2 20 (L) 06/11/2020   BUN 5 (L) 06/11/2020   CREATININE 0.55 06/11/2020   GLUCOSE 97 06/11/2020   GFRNONAA >60 06/11/2020   GFRAA 93 02/25/2020   CALCIUM 8.0 (L) 06/11/2020   PROT 7.2 06/08/2020   ALBUMIN 3.8 06/08/2020   LABGLOB 2.7 02/09/2017   AGRATIO 1.5 02/09/2017   BILITOT 0.4 06/08/2020   ALKPHOS 71 06/08/2020   AST 37 06/08/2020   ALT 25 06/08/2020   ANIONGAP 8 06/11/2020    CBG (last 3)  No results for input(s): GLUCAP in the last 72 hours.   GFR: Estimated Creatinine Clearance: 62.4 mL/min (by C-G formula based on SCr of 0.55 mg/dL).  Coagulation Profile: No results for input(s): INR, PROTIME in the last 168 hours.  Recent Results (from the past 240 hour(s))  Resp Panel by RT-PCR (Flu A&B, Covid)     Status: None   Collection Time: 06/08/20  4:53 PM   Specimen: Nasopharyngeal(NP) swabs in vial transport medium  Result Value Ref Range Status   SARS Coronavirus 2 by RT PCR NEGATIVE NEGATIVE Final    Comment: (NOTE) SARS-CoV-2 target nucleic acids are NOT DETECTED.  The SARS-CoV-2 RNA is generally detectable in upper respiratory specimens during the acute phase of infection. The lowest concentration of SARS-CoV-2 viral copies this assay can detect is 138 copies/mL. A negative result does not preclude SARS-Cov-2 infection and should not be used as the sole basis for treatment or other patient management decisions. A negative result may occur with  improper specimen collection/handling, submission of specimen other than nasopharyngeal swab, presence of viral mutation(s) within the areas targeted by this assay, and inadequate number of viral copies(<138 copies/mL). A negative result must  be combined with clinical observations, patient history, and epidemiological information. The  expected result is Negative.  Fact Sheet for Patients:  EntrepreneurPulse.com.au  Fact Sheet for Healthcare Providers:  IncredibleEmployment.be  This test is no t yet approved or cleared by the [place] FDA and  has been authorized for detection and/or diagnosis of SARS-CoV-2 by FDA under an Emergency Use Authorization (EUA). This EUA will remain  in effect (meaning this test can be used) for the duration of the COVID-19 declaration under Section 564(b)(1) of the Act, 21 U.S.C.section 360bbb-3(b)(1), unless the authorization is terminated  or revoked sooner.       Influenza A by PCR NEGATIVE NEGATIVE Final   Influenza B by PCR NEGATIVE NEGATIVE Final    Comment: (NOTE) The Xpert Xpress SARS-CoV-2/FLU/RSV plus assay is intended as an aid in the diagnosis of influenza from Nasopharyngeal swab specimens and should not be used as a sole basis for treatment. Nasal washings and aspirates are unacceptable for Xpert Xpress SARS-CoV-2/FLU/RSV testing.  Fact Sheet for Patients: EntrepreneurPulse.com.au  Fact Sheet for Healthcare Providers: IncredibleEmployment.be  This test is not yet approved or cleared by the [place] FDA and has been authorized for detection and/or diagnosis of SARS-CoV-2 by FDA under an Emergency Use Authorization (EUA). This EUA will remain in effect (meaning this test can be used) for the duration of the COVID-19 declaration under Section 564(b)(1) of the Act, 21 U.S.C. section 360bbb-3(b)(1), unless the authorization is terminated or revoked.  Performed at [facility], [address] [place], [place] [address]   Culture, Urine     Status: Abnormal   Collection Time: 06/08/20  8:18 PM   Specimen: Urine, Clean Catch  Result Value Ref Range Status   Specimen Description   Final    URINE, CLEAN CATCH Performed at [facility], [place] [address], [place], [place] [address]    Special Requests   Final    NONE Performed at [facility], [place] [address], [place], [place] [address]    Culture (A)  Final    <10,000 COLONIES/mL INSIGNIFICANT GROWTH Performed at [facility] [address], [place], [place] [address]    Report Status 06/10/2020 FINAL  Final        Radiology Studies: CT CHEST W CONTRAST  Result Date: 06/10/2020 CLINICAL DATA:  87 year old female with colorectal cancer. EXAM: CT CHEST WITH CONTRAST TECHNIQUE: Multidetector CT imaging of the chest was performed during intravenous contrast administration. CONTRAST:  22mL OMNIPAQUE IOHEXOL 300 MG/ML  SOLN COMPARISON:  CT dated 04/30/2019. FINDINGS: Cardiovascular: Top-normal cardiac size. No pericardial effusion. There is coronary vascular calcification. Moderate atherosclerotic calcification of the thoracic aorta. No aneurysmal dilatation or dissection. The origins of the great vessels of the aortic arch appear patent as visualized. No pulmonary artery embolus identified. Mediastinum/Nodes: 2 adjacent lymph nodes along the right pericardium/posterior cardiophrenic angle noted with the larger measuring 12 mm in short axis (previously 14 mm). A nodular density along the anterior right cardiophrenic angle measures 8 mm in short axis (previously 11 mm). Right anterior costophrenic lymph nodes measure 5 mm short axis (previously 7 mm). No new adenopathy. Bilateral axillary dissection clips. The esophagus and the thyroid gland are grossly unremarkable. No mediastinal fluid collection. Lungs/Pleura: Bibasilar linear atelectasis/scarring. No focal consolidation, pleural effusion, pneumothorax. The central airways are patent. Upper Abdomen: Cholecystectomy. Several upper abdominal and periportal lymph nodes similar or slightly decreased since the prior CT. Musculoskeletal: Osteopenia with degenerative changes  of the spine. No acute osseous pathology. IMPRESSION: 1. No  acute intrathoracic pathology. 2. Slight interval decrease in the size of the select lymph nodes since the prior CT. No new adenopathy in the chest. 3. Aortic Atherosclerosis (ICD10-I70.0). Electronically Signed   By: Anner Crete M.D.   On: 06/10/2020 18:20        Scheduled Meds: . pantoprazole (PROTONIX) IV  40 mg Intravenous Q24H   Continuous Infusions: . sodium chloride 50 mL/hr at 06/10/20 1010  . ferumoxytol       LOS: 2 days     Cordelia Poche, MD [facility] 06/11/2020, 7:34 AM  If 7PM-7AM, please contact night-coverage [email or web address]

## 2020-06-11 NOTE — Progress Notes (Signed)
[facility] Progress Note  CC:  Hematochezia  Subjective: She is fatigued this morning. She did not sleep well last night. She is having left mid to LLQ pain which is well controlled on Hydrocodone. She remains on a clear liquid diet. No N/V. No BM or hematochezia since completing bowel prep yesterday.   Objective:   EGD 06/10/2020: - Normal esophagus. - Multiple gastric polyps. Resected and retrieved. - Normal incisura, antrum and pylorus. - Normal examined duodenum. - No areas of active bleeding or high grade stigmata of bleeding on this study.  Colonoscopy 06/10/2020: - One 6 mm polyp in the sigmoid colon, removed with a cold snare. Resected and retrieved. - Malignant partially obstructing tumor in the proximal descending colon. Biopsied. Tattooed. - Diverticulosis in the sigmoid colon. - There was significant looping of the colon. - The distal rectum and anal verge are normal on retroflexion view.  Chest CT 06/10/2020: 1. No acute intrathoracic pathology. 2. Slight interval decrease in the size of the select lymph nodes since the prior CT. No new adenopathy in the chest. 3. Aortic Atherosclerosis    Vital signs in last 24 hours: Temp:  [97.9 F (36.6 C)-98.7 F (37.1 C)] 97.9 F (36.6 C) (01/12 0603) Pulse Rate:  [59-80] 61 (01/12 0603) Resp:  [14-26] 16 (01/12 0603) BP: (132-177)/(49-59) 139/55 (01/12 0603) SpO2:  [96 %-100 %] 98 % (01/12 0603) Last BM Date: 06/10/20 General:   Alert 87 year old female in NAD. Heart: RRR, no murmur.  Pulm:  Breath sounds clear throughout.  Abdomen: Soft, nondistended. Mild left mid to LLQ tenderness without rebound of guarding.  Extremities:  LEs with trace edema.  Neurologic:  Alert and  oriented x4;  grossly normal neurologically. Psych:  Alert and cooperative. Normal mood and affect.  Intake/Output from previous day: 01/11 0701 - 01/12 0700 In: 1700 [I.V.:1700] Out: -  Intake/Output this shift: No  intake/output data recorded.  Lab Results: Recent Labs    06/08/20 1717 06/09/20 0033 06/09/20 0723 06/10/20 0551 06/11/20 0557  WBC 7.0  --   --  7.5 7.7  HGB 10.8*   < > 10.1* 11.8* 9.8*  HCT 33.0*   < > 31.4* 37.4 31.2*  PLT 148*  --   --  170 131*   < > = values in this interval not displayed.   BMET Recent Labs    06/08/20 1211 06/10/20 0551 06/11/20 0557  NA 137 144 142  K 4.2 3.4* 3.0*  CL 104 115* 114*  CO2 23 19* 20*  GLUCOSE 114* 106* 97  BUN 14 8 5*  CREATININE 0.61 0.67 0.55  CALCIUM 9.0 8.9 8.0*   LFT Recent Labs    06/08/20 1211  PROT 7.2  ALBUMIN 3.8  AST 37  ALT 25  ALKPHOS 71  BILITOT 0.4   PT/INR No results for input(s): LABPROT, INR in the last 72 hours. Hepatitis Panel No results for input(s): HEPBSAG, HCVAB, HEPAIGM, HEPBIGM in the last 72 hours.  CT CHEST W CONTRAST  Result Date: 06/10/2020 CLINICAL DATA:  87 year old female with colorectal cancer. EXAM: CT CHEST WITH CONTRAST TECHNIQUE: Multidetector CT imaging of the chest was performed during intravenous contrast administration. CONTRAST:  87mL OMNIPAQUE IOHEXOL 300 MG/ML  SOLN COMPARISON:  CT dated 04/30/2019. FINDINGS: Cardiovascular: Top-normal cardiac size. No pericardial effusion. There is coronary vascular calcification. Moderate atherosclerotic calcification of the thoracic aorta. No aneurysmal dilatation or dissection. The origins of the great vessels of the aortic arch appear  patent as visualized. No pulmonary artery embolus identified. Mediastinum/Nodes: 2 adjacent lymph nodes along the right pericardium/posterior cardiophrenic angle noted with the larger measuring 12 mm in short axis (previously 14 mm). A nodular density along the anterior right cardiophrenic angle measures 8 mm in short axis (previously 11 mm). Right anterior costophrenic lymph nodes measure 5 mm short axis (previously 7 mm). No new adenopathy. Bilateral axillary dissection clips. The esophagus and the thyroid  gland are grossly unremarkable. No mediastinal fluid collection. Lungs/Pleura: Bibasilar linear atelectasis/scarring. No focal consolidation, pleural effusion, pneumothorax. The central airways are patent. Upper Abdomen: Cholecystectomy. Several upper abdominal and periportal lymph nodes similar or slightly decreased since the prior CT. Musculoskeletal: Osteopenia with degenerative changes of the spine. No acute osseous pathology. IMPRESSION: 1. No acute intrathoracic pathology. 2. Slight interval decrease in the size of the select lymph nodes since the prior CT. No new adenopathy in the chest. 3. Aortic Atherosclerosis (ICD10-I70.0). Electronically Signed   By: Anner Crete M.D.   On: 06/10/2020 18:20    Assessment / Plan:  24. 87 year old female admitted to the hospital with lower abdominal pain, hematochezia and anemia.No prior history of GI bleed. Hg 11.8 -> 10.2 -> 10 -> 11.8 (baseine Hg 13,3).Today Hg 9.8. Abdominal/pelvic CT angiogram 06/08/2020 showed a focal area of inflammation/infiltrative neoplasm involving distal transverse colon, sigmoid diverticulosis without evidence of diverticulitis. Colonoscopy 1/11 identified a malignant partially obstructing tumor in the proximal descending colon, a 54mm polyp from the sigmoid colon and sigmoid diverticulosis. CEA 6.8. Chest CT showed a slight interval decreased in mediastinal lymph nodes, no new adenopathy. She is having mild left mid to LLQ pain this morning. No N/V. She is afebrile and remains hemodynamically stable. -Surgery consult requested, will need colon resection  -Oncology following, await pathology report  -Clear liquid diet  -Pain management per the hospitalist  - Feraheme IV today per oncology   2. History of adenomatous colon polyps -See plan in#1  3. History of GERD. EGD 06/10/2020 showed fundic gland polyps otherwise appeared normal.  -Change Pantoprazole to 40mg  po QD  4. Remote history of breast cancer. Remote  history of melanoma.   5. History of diastolic CHF. LV EF 60 - 65% per ECHO 07/2019  6. Hypokalemia. K+ 3.0. -KCL replacement per the hospitalist   Further recommendations per Dr. Bryan Lemma      Principal Problem:   Lower GI bleed Active Problems:   GERD (gastroesophageal reflux disease)   Acute blood loss anemia   HTN (hypertension)   Acute lower GI bleeding   Gastric polyps   Colonic mass   Diverticulosis of colon without hemorrhage   Adenomatous polyp of sigmoid colon     LOS: 2 days   Crystal Lee  06/11/2020, 8:08 AM

## 2020-06-11 NOTE — Consult Note (Signed)
Referral MD  Reason for Referral: Mass in the transverse:-GI bleeding; history of stage Ia breast cancer in 1998  Chief Complaint  Patient presents with  . Rectal Bleeding  : I had bleeding.  HPI: Ms. Kovich is well-known to me.  She is a very charming 87 year old white female.  I have been seeing her for a few years.  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  There is also a remote history of melanoma.  She has had chronic but stable adenopathy in the mediastinum.  She apparently came in with GI bleeding.  She had no abdominal pain.  She had no weight loss.  She had no change in bowel or bladder habits.  She ultimately underwent a colonoscopy.  This was done by Dr. Bryan Lemma.  He found a mass in the distal transverse colon.  This was biopsied.  Pathology results are not back yet.  When she came in, her white cell count 7.4.  Hemoglobin 11.8.  Platelet count 161,000.  MCV was 89.  She was admitted on 9 January.  On the 10th, her hemoglobin was 10.2.  Today, her white count 7.7.  Hemoglobin 9.8 and platelet count 131,000.  Her iron studies show saturation of 15% with an iron of 49 and ferritin of 45.  She did have a CT angiogram done of the abdomen and pelvis.  This was done on 06/08/2020.  There was a focal area of wall thickening involving distal transverse colon.  This measured 4.5 cm.  There was some enlarged lymph nodes around the area of thickening.  There is no evidence of hepatic abnormalities.  She had a CT of the chest on 06/10/2020.  This showed some decreased but stable overall lymphadenopathy in the mediastinum.  There is no pulmonary nodules.  I think she has been seen by surgery but no final recommendation has been made.  She looks good.  I think that she would be a good candidate for surgical resection.  She has had no fever.  She has had no cough.  There is been no leg swelling..  She does feel tired.  I suspect she probably is going to  need some IV iron.  Overall, her performance status is [name].    Past Medical History:  Diagnosis Date  . Arthritis   . Cancer (HCC)    HX BREAST CANCER/ SKIN CANCER  . Complication of anesthesia    N/V WITH MORPHINE  . Difficulty sleeping   . Fractured hip ([place])    LEFT - AUG 2016  . GERD (gastroesophageal reflux disease)   . Hyperlipidemia   . Hypertension   . Melanoma ([place])   . Neuropathy   . Nocturia   . Osteopenia   . 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 PONV (postoperative nausea and vomiting)    PT STATES MORPHINE CAUSED N/V  . Rosacea   . Sleep apnea   . Stage 1 breast cancer, ER+, right ([place]) 07/04/2017  :  Past Surgical History:  Procedure Laterality Date  . [place] STUDY N/A 06/21/2018   Procedure: [place] STUDY;  Surgeon: Lavena Bullion, DO;  Location: [facility];  Service: Gastroenterology;  Laterality: N/A;  with impedance  . ABDOMINAL HYSTERECTOMY  2010  . ANAL RECTAL MANOMETRY N/A 09/19/2019   Procedure: ANO RECTAL MANOMETRY;  Surgeon: Mauri Pole, MD;  Location: [facility];  Service: Endoscopy;  Laterality: N/A;  . APPENDECTOMY  1949  . [place]  . BREAST SURGERY    . CATARACT EXTRACTION Bilateral 2011  . CHOLECYSTECTOMY    . ESOPHAGEAL MANOMETRY N/A 06/21/2018   Procedure: ESOPHAGEAL MANOMETRY (EM);  Surgeon: Lavena Bullion, DO;  Location: [facility];  Service: Gastroenterology;  Laterality: N/A;  . GALLBLADDER SURGERY  2015  . JOINT REPLACEMENT     RT TOTAL HIP / RT TOTAL KNEE  . MASTECTOMY  1998   BILATERAL   . [place] IMPEDANCE STUDY  06/21/2018   Procedure: [place] IMPEDANCE STUDY;  Surgeon: Lavena Bullion, DO;  Location: [facility];  Service: Gastroenterology;;  . SKIN CANCER EXCISION  2016   RT SIDE OF NOSE  . TONSILLECTOMY  1953  . TOTAL HIP ARTHROPLASTY  2010   RIGHT  . TOTAL HIP ARTHROPLASTY Left 05/09/2015   Procedure: LEFT TOTAL HIP ARTHROPLASTY ANTERIOR APPROACH;  Surgeon: Gaynelle Arabian,  MD;  Location: [facility];  Service: Orthopedics;  Laterality: Left;  . TOTAL KNEE ARTHROPLASTY  2001  . TUMOR REMOVAL  2012   ABDOMINAL - NON CANCEROUS  :   Current Facility-Administered Medications:  .  0.9 %  sodium chloride infusion, , Intravenous, Continuous, Cirigliano, Vito V, DO, Last Rate: 50 mL/hr at 06/10/20 1010, New Bag at 06/10/20 1010 .  acetaminophen (TYLENOL) tablet 650 mg, 650 mg, Oral, Q6H PRN, 650 mg at 06/09/20 0735 **OR** acetaminophen (TYLENOL) suppository 650 mg, 650 mg, Rectal, Q6H PRN, Cirigliano, Vito V, DO .  HYDROcodone-acetaminophen (NORCO/VICODIN) 5-325 MG per tablet 1 tablet, 1 tablet, Oral, Q6H PRN, Cirigliano, Vito V, DO, 1 tablet at 06/10/20 2246 .  ondansetron (ZOFRAN) injection 4 mg, 4 mg, Intravenous, Q8H PRN, Cirigliano, Vito V, DO, 4 mg at 06/09/20 2157 .  pantoprazole (PROTONIX) injection 40 mg, 40 mg, Intravenous, Q24H, Cirigliano, Vito V, DO, 40 mg at 06/10/20 1540:  . pantoprazole (PROTONIX) IV  40 mg Intravenous Q24H  :  Allergies  Allergen Reactions  . Darvon [Propoxyphene] Nausea And Vomiting and Palpitations    Darvocet Causes Sweats  . Adhesive [Tape]   . Denture Adhesive   . Morphine And Related Nausea And Vomiting  :  Family History  Problem Relation Age of Onset  . Other Mother        complications from flu  . Other Father        unsure of cause  . Colon cancer Neg Hx   :  Social History   Socioeconomic History  . Marital status: Widowed    Spouse name: Not on file  . Number of children: 3  . Years of education: 16 years  . Highest education level: Not on file  Occupational History  . Occupation: Retired  Tobacco Use  . Smoking status: Former Smoker    Packs/day: 0.50    Years: 20.00    Pack years: 10.00    Quit date: 02/03/1976    Years since quitting: 44.3  . Smokeless tobacco: Never Used  Vaping Use  . Vaping Use: Never used  Substance and Sexual Activity  . Alcohol use: No  . Drug use: No  . Sexual activity:  Yes    Birth control/protection: Post-menopausal  Other Topics Concern  . Not on file  Social History Narrative   Lives at home with husband.   Right-handed.   No caffeine use.   Social Determinants of Health   Financial Resource Strain: Not on file  Food Insecurity: Not on file  Transportation Needs: Not on file  Physical Activity: Not on file  Stress: Not on file  Social Connections: Not on file  Intimate Partner Violence: Not on file  :  Review of Systems  Constitutional: Positive for malaise/fatigue.  HENT: Negative.   Eyes: Negative.   Respiratory: Negative.   Cardiovascular: Negative.   Gastrointestinal: Positive for blood in stool.  Genitourinary: Negative.   Musculoskeletal: Positive for back pain.  Skin: Negative.   Neurological: Negative.   Endo/Heme/Allergies: Negative.   Psychiatric/Behavioral: Negative.      Exam:  Physical Exam Vitals reviewed.  HENT:     Head: Normocephalic and atraumatic.     Mouth/Throat:     Mouth: Oropharynx is clear and moist.  Eyes:     Extraocular Movements: EOM normal.     Pupils: Pupils are equal, round, and reactive to light.  Cardiovascular:     Rate and Rhythm: Normal rate and regular rhythm.     Heart sounds: Normal heart sounds.  Pulmonary:     Effort: Pulmonary effort is normal.     Breath sounds: Normal breath sounds.  Abdominal:     General: Bowel sounds are normal.     Palpations: Abdomen is soft.  Musculoskeletal:        General: No tenderness, deformity or edema. Normal range of motion.     Cervical back: Normal range of motion.  Lymphadenopathy:     Cervical: No cervical adenopathy.  Skin:    General: Skin is warm and dry.     Findings: No erythema or rash.  Neurological:     Mental Status: She is alert and oriented to person, place, and time.  Psychiatric:        Mood and Affect: Mood and affect normal.        Behavior: Behavior normal.        Thought Content: Thought content normal.         Judgment: Judgment normal.      Patient Vitals for the past 24 hrs:  BP Temp Temp src Pulse Resp SpO2  06/11/20 0603 (!) 139/55 97.9 F (36.6 C) Oral 61 16 98 %  06/10/20 2131 (!) 132/54 98.4 F (36.9 C) Oral (!) 59 14 96 %  06/10/20 1510 (!) 144/57 -- -- 66 20 99 %  06/10/20 1500 (!) 165/59 -- -- 76 19 98 %  06/10/20 1450 (!) 137/53 -- -- 80 (!) 26 99 %  06/10/20 1440 (!) 136/49 98.7 F (37.1 C) Axillary 67 17 100 %  06/10/20 1237 (!) 177/54 98.7 F (37.1 C) Oral 76 (!) 21 100 %     Recent Labs    06/10/20 0551 06/11/20 0557  WBC 7.5 7.7  HGB 11.8* 9.8*  HCT 37.4 31.2*  PLT 170 131*   Recent Labs    06/10/20 0551 06/11/20 0557  NA 144 142  K 3.4* 3.0*  CL 115* 114*  CO2 19* 20*  GLUCOSE 106* 97  BUN 8 5*  CREATININE 0.67 0.55  CALCIUM 8.9 8.0*    Blood smear review: None  Pathology: Pending    Assessment and Plan: Ms. 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  This really should not be an issue at this point.  I suspect that she has a new primary malignancy.  Looks like this is going to be colon cancer.  We do not have the pathology back yet.  I do not see any problems with her  having surgery.  I think she would be a good candidate for surgical resection.  Given that she is 87 years old, I cannot imagine that she would need any adjuvant therapy.  Hopefully, the swollen lymph nodes that appear to be around the area of abnormality on the CT scan are just reactive.  I noted that her CEA was 6.8.  This is minimally elevated.  She will need some IV iron.  I think this would be a good idea for her and would help make her feel a little bit better.  I appreciate the outstanding care that she is getting from everybody up on 6 E.  I know that she is getting the best care and she is very appreciative of all of the staff's concern and compassion.  Lattie Haw, MD  Romans 8:28

## 2020-06-11 NOTE — Telephone Encounter (Signed)
Provided Amber with Dr Iver Nestle pager number for Dr Melina Copa to reach him.

## 2020-06-12 ENCOUNTER — Inpatient Hospital Stay (HOSPITAL_COMMUNITY): Payer: Medicare Other | Admitting: Certified Registered Nurse Anesthetist

## 2020-06-12 ENCOUNTER — Encounter (HOSPITAL_COMMUNITY): Payer: Self-pay | Admitting: Family Medicine

## 2020-06-12 ENCOUNTER — Other Ambulatory Visit: Payer: Self-pay

## 2020-06-12 ENCOUNTER — Encounter (HOSPITAL_COMMUNITY)
Admission: EM | Disposition: A | Discharge status: Home / Self Care | Payer: Self-pay | Source: Home / Self Care | Attending: Internal Medicine

## 2020-06-12 DIAGNOSIS — K629 Disease of anus and rectum, unspecified: Secondary | ICD-10-CM | POA: Diagnosis not present

## 2020-06-12 DIAGNOSIS — R970 Elevated carcinoembryonic antigen [CEA]: Secondary | ICD-10-CM | POA: Diagnosis not present

## 2020-06-12 DIAGNOSIS — K922 Gastrointestinal hemorrhage, unspecified: Secondary | ICD-10-CM | POA: Diagnosis not present

## 2020-06-12 DIAGNOSIS — Z853 Personal history of malignant neoplasm of breast: Secondary | ICD-10-CM | POA: Diagnosis not present

## 2020-06-12 HISTORY — PX: COLON RESECTION: SHX5231

## 2020-06-12 LAB — CBC
HCT: 30.4 % — ABNORMAL LOW (ref 36.0–46.0)
Hemoglobin: 9.6 g/dL — ABNORMAL LOW (ref 12.0–15.0)
MCH: 29.1 pg (ref 26.0–34.0)
MCHC: 31.6 g/dL (ref 30.0–36.0)
MCV: 92.1 fL (ref 80.0–100.0)
Platelets: 130 10*3/uL — ABNORMAL LOW (ref 150–400)
RBC: 3.3 MIL/uL — ABNORMAL LOW (ref 3.87–5.11)
RDW: 13.2 % (ref 11.5–15.5)
WBC: 5.3 10*3/uL (ref 4.0–10.5)
nRBC: 0 % (ref 0.0–0.2)

## 2020-06-12 LAB — COMPREHENSIVE METABOLIC PANEL
ALT: 16 U/L (ref 0–44)
AST: 21 U/L (ref 15–41)
Albumin: 3 g/dL — ABNORMAL LOW (ref 3.5–5.0)
Alkaline Phosphatase: 41 U/L (ref 38–126)
Anion gap: 9 (ref 5–15)
BUN: <5 mg/dL — ABNORMAL LOW (ref 8–23)
CO2: 19 mmol/L — ABNORMAL LOW (ref 22–32)
Calcium: 8.2 mg/dL — ABNORMAL LOW (ref 8.9–10.3)
Chloride: 112 mmol/L — ABNORMAL HIGH (ref 98–111)
Creatinine, Ser: 0.54 mg/dL (ref 0.44–1.00)
GFR, Estimated: >60 mL/min (ref >60)
Glucose, Bld: 92 mg/dL (ref 70–99)
Potassium: 3.6 mmol/L (ref 3.5–5.1)
Sodium: 140 mmol/L (ref 135–145)
Total Bilirubin: 0.7 mg/dL (ref 0.3–1.2)
Total Protein: 5.4 g/dL — ABNORMAL LOW (ref 6.5–8.1)

## 2020-06-12 SURGERY — COLON RESECTION LAPAROSCOPIC
Anesthesia: General

## 2020-06-12 MED ORDER — PHENYLEPHRINE 40 MCG/ML (10ML) SYRINGE FOR IV PUSH (FOR BLOOD PRESSURE SUPPORT)
PREFILLED_SYRINGE | INTRAVENOUS | Status: AC
  Filled 2020-06-12: qty 10

## 2020-06-12 MED ORDER — 0.9 % SODIUM CHLORIDE (POUR BTL) OPTIME
TOPICAL | Status: DC | PRN
  Administered 2020-06-12: 1000 mL

## 2020-06-12 MED ORDER — HYDROMORPHONE HCL 1 MG/ML IJ SOLN
INTRAMUSCULAR | Status: AC
  Filled 2020-06-12: qty 1

## 2020-06-12 MED ORDER — DROPERIDOL 2.5 MG/ML IJ SOLN
INTRAMUSCULAR | Status: DC | PRN
Start: 2020-06-12 — End: 2020-06-12
  Administered 2020-06-12: .625 mg via INTRAVENOUS

## 2020-06-12 MED ORDER — LABETALOL HCL 5 MG/ML IV SOLN
INTRAVENOUS | Status: AC
  Filled 2020-06-12: qty 4

## 2020-06-12 MED ORDER — OXYCODONE HCL 5 MG/5ML PO SOLN: 5.0000 mg | Freq: Once | ORAL | Status: DC | PRN

## 2020-06-12 MED ORDER — ACETAMINOPHEN 10 MG/ML IV SOLN
1000.0000 mg | Freq: Once | INTRAVENOUS | Status: AC
  Administered 2020-06-12: 1000 mg via INTRAVENOUS

## 2020-06-12 MED ORDER — LIDOCAINE HCL (PF) 2 % IJ SOLN
INTRAMUSCULAR | Status: AC
  Filled 2020-06-12: qty 5

## 2020-06-12 MED ORDER — DEXAMETHASONE SODIUM PHOSPHATE 4 MG/ML IJ SOLN
INTRAMUSCULAR | Status: DC | PRN
  Administered 2020-06-12: 5 mg via INTRAVENOUS

## 2020-06-12 MED ORDER — CHLORHEXIDINE GLUCONATE CLOTH 2 % EX PADS
6.0000 | MEDICATED_PAD | Freq: Every day | CUTANEOUS | Status: DC
  Administered 2020-06-13 – 2020-06-18 (×6): 6 via TOPICAL

## 2020-06-12 MED ORDER — PROPOFOL 10 MG/ML IV BOLUS
INTRAVENOUS | Status: DC | PRN
  Administered 2020-06-12: 100 mg via INTRAVENOUS
  Administered 2020-06-12: 50 mg via INTRAVENOUS

## 2020-06-12 MED ORDER — OXYCODONE HCL 5 MG PO TABS: 5.0000 mg | ORAL_TABLET | Freq: Once | ORAL | Status: DC | PRN

## 2020-06-12 MED ORDER — BUPIVACAINE HCL (PF) 0.5 % IJ SOLN
INTRAMUSCULAR | Status: DC | PRN
  Administered 2020-06-12: 30 mL

## 2020-06-12 MED ORDER — ONDANSETRON HCL 4 MG/2ML IJ SOLN
4.0000 mg | Freq: Four times a day (QID) | INTRAMUSCULAR | Status: DC | PRN
  Administered 2020-06-13 – 2020-06-18 (×4): 4 mg via INTRAVENOUS
  Filled 2020-06-12 (×4): qty 2

## 2020-06-12 MED ORDER — DROPERIDOL 2.5 MG/ML IJ SOLN
INTRAMUSCULAR | Status: AC
  Filled 2020-06-12: qty 2

## 2020-06-12 MED ORDER — HYDROMORPHONE HCL 1 MG/ML IJ SOLN: 0.5000 mg | INTRAMUSCULAR | Status: DC | PRN

## 2020-06-12 MED ORDER — ENSURE SURGERY PO LIQD
237.0000 mL | Freq: Two times a day (BID) | ORAL | Status: DC
  Administered 2020-06-13 – 2020-06-18 (×8): 237 mL via ORAL
  Filled 2020-06-12 (×12): qty 237

## 2020-06-12 MED ORDER — FENTANYL CITRATE (PF) 100 MCG/2ML IJ SOLN
INTRAMUSCULAR | Status: DC | PRN
  Administered 2020-06-12 (×3): 50 ug via INTRAVENOUS
  Administered 2020-06-12: 25 ug via INTRAVENOUS
  Administered 2020-06-12 (×2): 50 ug via INTRAVENOUS
  Administered 2020-06-12: 25 ug via INTRAVENOUS

## 2020-06-12 MED ORDER — DEXAMETHASONE SODIUM PHOSPHATE 10 MG/ML IJ SOLN
INTRAMUSCULAR | Status: AC
  Filled 2020-06-12: qty 1

## 2020-06-12 MED ORDER — PROMETHAZINE HCL 25 MG/ML IJ SOLN: 6.2500 mg | INTRAMUSCULAR | Status: DC | PRN

## 2020-06-12 MED ORDER — BUPIVACAINE HCL (PF) 0.5 % IJ SOLN
INTRAMUSCULAR | Status: AC
  Filled 2020-06-12: qty 30

## 2020-06-12 MED ORDER — GABAPENTIN 300 MG PO CAPS
300.0000 mg | ORAL_CAPSULE | Freq: Two times a day (BID) | ORAL | Status: DC
  Administered 2020-06-12 – 2020-06-18 (×12): 300 mg via ORAL
  Filled 2020-06-12 (×12): qty 1

## 2020-06-12 MED ORDER — CELECOXIB 200 MG PO CAPS
200.0000 mg | ORAL_CAPSULE | Freq: Two times a day (BID) | ORAL | Status: DC
  Administered 2020-06-12 – 2020-06-18 (×12): 200 mg via ORAL
  Filled 2020-06-12 (×12): qty 1

## 2020-06-12 MED ORDER — LIDOCAINE 2% (20 MG/ML) 5 ML SYRINGE
INTRAMUSCULAR | Status: DC | PRN
  Administered 2020-06-12: 80 mg via INTRAVENOUS

## 2020-06-12 MED ORDER — ENOXAPARIN SODIUM 40 MG/0.4ML ~~LOC~~ SOLN
40.0000 mg | SUBCUTANEOUS | Status: DC
  Administered 2020-06-13 – 2020-06-18 (×6): 40 mg via SUBCUTANEOUS
  Filled 2020-06-12 (×6): qty 0.4

## 2020-06-12 MED ORDER — ACETAMINOPHEN 325 MG PO TABS: 650.0000 mg | ORAL_TABLET | Freq: Four times a day (QID) | ORAL | Status: DC | PRN

## 2020-06-12 MED ORDER — ROCURONIUM BROMIDE 10 MG/ML (PF) SYRINGE
PREFILLED_SYRINGE | INTRAVENOUS | Status: AC
  Filled 2020-06-12: qty 10

## 2020-06-12 MED ORDER — DIPHENHYDRAMINE HCL 50 MG/ML IJ SOLN: 12.5000 mg | Freq: Four times a day (QID) | INTRAMUSCULAR | Status: DC | PRN

## 2020-06-12 MED ORDER — POTASSIUM CHLORIDE IN NACL 20-0.9 MEQ/L-% IV SOLN
INTRAVENOUS | Status: DC
  Filled 2020-06-12 (×6): qty 1000

## 2020-06-12 MED ORDER — FENTANYL CITRATE (PF) 100 MCG/2ML IJ SOLN: 50.0000 ug | INTRAMUSCULAR | Status: DC | PRN

## 2020-06-12 MED ORDER — BUPIVACAINE LIPOSOME 1.3 % IJ SUSP
20.0000 mL | Freq: Once | INTRAMUSCULAR | Status: AC
  Administered 2020-06-12: 20 mL
  Filled 2020-06-12: qty 20

## 2020-06-12 MED ORDER — ROCURONIUM BROMIDE 10 MG/ML (PF) SYRINGE
PREFILLED_SYRINGE | INTRAVENOUS | Status: DC | PRN
  Administered 2020-06-12: 100 mg via INTRAVENOUS

## 2020-06-12 MED ORDER — SUGAMMADEX SODIUM 500 MG/5ML IV SOLN
INTRAVENOUS | Status: AC
  Filled 2020-06-12: qty 5

## 2020-06-12 MED ORDER — DIPHENHYDRAMINE HCL 12.5 MG/5ML PO ELIX: 12.5000 mg | ORAL_SOLUTION | Freq: Four times a day (QID) | ORAL | Status: DC | PRN

## 2020-06-12 MED ORDER — FENTANYL CITRATE (PF) 100 MCG/2ML IJ SOLN
INTRAMUSCULAR | Status: AC
  Filled 2020-06-12: qty 2

## 2020-06-12 MED ORDER — ACETAMINOPHEN 10 MG/ML IV SOLN
INTRAVENOUS | Status: AC
  Filled 2020-06-12: qty 100

## 2020-06-12 MED ORDER — LACTATED RINGERS IV SOLN: INTRAVENOUS | Status: DC

## 2020-06-12 MED ORDER — ALVIMOPAN 12 MG PO CAPS
12.0000 mg | ORAL_CAPSULE | Freq: Two times a day (BID) | ORAL | Status: DC
  Administered 2020-06-13 – 2020-06-15 (×5): 12 mg via ORAL
  Filled 2020-06-12 (×7): qty 1

## 2020-06-12 MED ORDER — SUGAMMADEX SODIUM 200 MG/2ML IV SOLN
INTRAVENOUS | Status: DC | PRN
  Administered 2020-06-12: 300 mg via INTRAVENOUS

## 2020-06-12 MED ORDER — ONDANSETRON HCL 4 MG/2ML IJ SOLN
INTRAMUSCULAR | Status: DC | PRN
  Administered 2020-06-12: 4 mg via INTRAVENOUS

## 2020-06-12 MED ORDER — HYDRALAZINE HCL 20 MG/ML IJ SOLN
INTRAMUSCULAR | Status: DC | PRN
Start: 2020-06-12 — End: 2020-06-12
  Administered 2020-06-12 (×2): 5 mg via INTRAVENOUS

## 2020-06-12 MED ORDER — ONDANSETRON HCL 4 MG PO TABS: 4.0000 mg | ORAL_TABLET | Freq: Four times a day (QID) | ORAL | Status: DC | PRN

## 2020-06-12 MED ORDER — LACTATED RINGERS IR SOLN
Status: DC | PRN
  Administered 2020-06-12: 1000 mL

## 2020-06-12 MED ORDER — ONDANSETRON HCL 4 MG/2ML IJ SOLN
INTRAMUSCULAR | Status: AC
  Filled 2020-06-12: qty 2

## 2020-06-12 MED ORDER — HYDROMORPHONE HCL 1 MG/ML IJ SOLN
0.2500 mg | INTRAMUSCULAR | Status: DC | PRN
  Administered 2020-06-12 (×5): 0.5 mg via INTRAVENOUS

## 2020-06-12 MED ORDER — LABETALOL HCL 5 MG/ML IV SOLN
10.0000 mg | Freq: Once | INTRAVENOUS | Status: AC
  Administered 2020-06-12: 10 mg via INTRAVENOUS

## 2020-06-12 MED ORDER — HYDROCODONE-ACETAMINOPHEN 5-325 MG PO TABS
1.0000 | ORAL_TABLET | ORAL | Status: DC | PRN
  Administered 2020-06-12 – 2020-06-18 (×20): 2 via ORAL
  Filled 2020-06-12 (×11): qty 2
  Filled 2020-06-12: qty 1
  Filled 2020-06-12 (×4): qty 2
  Filled 2020-06-12: qty 1
  Filled 2020-06-12 (×4): qty 2

## 2020-06-12 SURGICAL SUPPLY — 60 items
APPLIER CLIP 5 13 M/L LIGAMAX5 (MISCELLANEOUS)
APPLIER CLIP ROT 10 11.4 M/L (STAPLE)
BLADE EXTENDED COATED 6.5IN (ELECTRODE) IMPLANT
BLADE HEX COATED 2.75 (ELECTRODE) ×2 IMPLANT
CABLE HIGH FREQUENCY MONO STRZ (ELECTRODE) IMPLANT
CELLS DAT CNTRL 66122 CELL SVR (MISCELLANEOUS) ×1 IMPLANT
CLIP APPLIE 5 13 M/L LIGAMAX5 (MISCELLANEOUS) IMPLANT
CLIP APPLIE ROT 10 11.4 M/L (STAPLE) IMPLANT
COVER WAND RF STERILE (DRAPES) IMPLANT
DECANTER SPIKE VIAL GLASS SM (MISCELLANEOUS) ×2 IMPLANT
DERMABOND ADVANCED (GAUZE/BANDAGES/DRESSINGS) ×1
DERMABOND ADVANCED .7 DNX12 (GAUZE/BANDAGES/DRESSINGS) ×1 IMPLANT
DRAIN CHANNEL 19F RND (DRAIN) IMPLANT
DRAPE LAPAROSCOPIC ABDOMINAL (DRAPES) ×2 IMPLANT
DRSG OPSITE POSTOP 4X10 (GAUZE/BANDAGES/DRESSINGS) IMPLANT
DRSG OPSITE POSTOP 4X6 (GAUZE/BANDAGES/DRESSINGS) ×2 IMPLANT
DRSG OPSITE POSTOP 4X8 (GAUZE/BANDAGES/DRESSINGS) IMPLANT
ELECT REM PT RETURN 15FT ADLT (MISCELLANEOUS) ×2 IMPLANT
EVACUATOR DRAINAGE 10X20 100CC (DRAIN) IMPLANT
EVACUATOR SILICONE 100CC (DRAIN)
GAUZE SPONGE 4X4 12PLY STRL (GAUZE/BANDAGES/DRESSINGS) ×2 IMPLANT
GLOVE BIO SURGEON STRL SZ7.5 (GLOVE) ×2 IMPLANT
GOWN STRL REUS W/TWL LRG LVL3 (GOWN DISPOSABLE) ×2 IMPLANT
GOWN STRL REUS W/TWL XL LVL3 (GOWN DISPOSABLE) ×4 IMPLANT
KIT TURNOVER KIT A (KITS) IMPLANT
LEGGING LITHOTOMY PAIR STRL (DRAPES) IMPLANT
LIGASURE IMPACT 36 18CM CVD LR (INSTRUMENTS) ×2 IMPLANT
NS IRRIG 1000ML POUR BTL (IV SOLUTION) ×4 IMPLANT
PACK COLON (CUSTOM PROCEDURE TRAY) ×4 IMPLANT
PENCIL SMOKE EVACUATOR (MISCELLANEOUS) IMPLANT
PROTECTOR NERVE ULNAR (MISCELLANEOUS) IMPLANT
RTRCTR WOUND ALEXIS 18CM MED (MISCELLANEOUS) ×2
SCISSORS LAP 5X35 DISP (ENDOMECHANICALS) ×2 IMPLANT
SEALER TISSUE G2 CVD JAW 45CM (ENDOMECHANICALS) IMPLANT
SET IRRIG TUBING LAPAROSCOPIC (IRRIGATION / IRRIGATOR) IMPLANT
SET TUBE SMOKE EVAC HIGH FLOW (TUBING) ×2 IMPLANT
SHEARS HARMONIC ACE PLUS 36CM (ENDOMECHANICALS) ×2 IMPLANT
SPONGE LAP 18X18 RF (DISPOSABLE) IMPLANT
STAPLER VISISTAT 35W (STAPLE) ×2 IMPLANT
SURGILUBE 2OZ TUBE FLIPTOP (MISCELLANEOUS) IMPLANT
SUT PDS AB 1 CTX 36 (SUTURE) IMPLANT
SUT PDS AB 1 TP1 96 (SUTURE) IMPLANT
SUT PROLENE 0 CT 2 (SUTURE) IMPLANT
SUT PROLENE 2 0 KS (SUTURE) IMPLANT
SUT SILK 2 0 (SUTURE) ×1
SUT SILK 2 0 SH CR/8 (SUTURE) ×2 IMPLANT
SUT SILK 2-0 18XBRD TIE 12 (SUTURE) ×1 IMPLANT
SUT SILK 3 0 (SUTURE) ×1
SUT SILK 3 0 SH CR/8 (SUTURE) ×2 IMPLANT
SUT SILK 3-0 18XBRD TIE 12 (SUTURE) ×1 IMPLANT
SUT VIC AB 2-0 SH 18 (SUTURE) IMPLANT
SUT VICRYL 2 0 18  UND BR (SUTURE)
SUT VICRYL 2 0 18 UND BR (SUTURE) IMPLANT
SYS LAPSCP GELPORT 120MM (MISCELLANEOUS)
SYSTEM LAPSCP GELPORT 120MM (MISCELLANEOUS) IMPLANT
TROCAR XCEL BLUNT TIP 100MML (ENDOMECHANICALS) IMPLANT
TROCAR XCEL NON-BLD 11X100MML (ENDOMECHANICALS) IMPLANT
TROCAR XCEL UNIV SLVE 11M 100M (ENDOMECHANICALS) IMPLANT
TUBING CONNECTING 10 (TUBING) ×4 IMPLANT
YANKAUER SUCT BULB TIP NO VENT (SUCTIONS) ×2 IMPLANT

## 2020-06-12 NOTE — Op Note (Signed)
Surgeon: Dr. [name]  Assistants: Dr. Berniece Salines --resident, assisting   Anesthesia: General endotracheal anesthesia  ASA Class: 3  Operation: Laparoscopic assisted transverse colectomy   Indication: 87 year-old woman with colonic mass near splenic flexure presents for partial colectomy.   Findings: - Mid transverse colon mass with proximal and distal tattoos  - Thickened associated mesentery with numerous enlarged, hard lymph nodes  - Nodular liver - No carcinomatosis  Operative details: The patient was placed supine on the operating table and general endotracheal anesthesia was induced. A Foley was placed. The abdomen and perineum were prepped and the abdomen was draped. Following a timeout, a longitudinal incision was made inferior to the umbilicus and Hasson technique used to access the peritoneal cavity. A purse-string 0-Vicryl stitch was placed. A Hasson port was placed and laparoscopy revealed no carcinomatosis or gross liver lesions. Two additional 5 mm ports were placed (epigastrium, RLQ) and the omentum was mobilized, revealing the tattoos and mass in the mid-transverse colon. A harmonic device was used to divide the omentum superior to the hepatic flexure to mobilize the hepatic flexure. Given the mobile transverse colon, we then elected to create a mini laparotomy and proceed with transverse colectomy.   We made a 4 cm longitudinal midline laparotomy in the epigastrium. The peritoneum was entered and a wound protector placed. The transverse colon was exteriorized and found to be well mobilized. We divided some of the omentum overlying the transverse colon. We made a window in the mesentery ~5 cm proximal to the proximal tattoo and divided the colon with a linear stapler. We then similarly divided the colon about 5 cm distal to the distal tattoo with a linear stapler. We used a Ligasure device to divide the mesentery. The transverse colon was then handed off for  permanent pathology.   We then turned to reconstruction. The anti-mesenteric portions of the remaining ends of the colon were aligned. A 3-0 silk stay stitch was placed to align the ends of the colon. Electrocautery was used to make a colotomy in either end of the colon along the antimesenteric surface. A linear stapler was used to fashion the common enterotomy. We then used a TA stapler to close the common enterotomy, completing the side-to-side functional end-to-end colocolonic anastomosis. The ends of the anastomosis were dunked with 3-0 silk Lembert stitches, and the mesenteric defect was closed with interrupted stitches.   The abdomen was irrigated. Hemostasis was ensured. The fascia of the laparotomy was closed with two 1-0 PDS stitches in running fashion. The previously placed purse string was tied down to close the fascia of the infraumbilical incision. Skin was closed with running 4-0 monocryl. Skin glue was applied. The patient was extubated and transferred to the recovery room.   Complications: None  Disposition: PACU  Specimen: Transverse colon

## 2020-06-12 NOTE — Progress Notes (Signed)
Looks like Ms. Letendre will go to surgery today.  I appreciate Dr. Trevor Mace help on this.  It does not sound like this should be much of a problem with surgery.  It should be done robotically.  I cannot see any reason why she would not come through surgery in good shape.  She feels well.  She did receive iron yesterday.  She has not eaten all that much.  We will just have to await the findings with surgery.  I would highly doubt that she is going to require any adjuvant therapy given her age.  Lattie Haw, MD

## 2020-06-12 NOTE — Anesthesia Postprocedure Evaluation (Signed)
Anesthesia Post Note  Patient: Crystal Lee  Procedure(s) Performed: LAPAROSCOPIC COLON RESECTION (N/A )     Patient location during evaluation: PACU Anesthesia Type: General Level of consciousness: awake and alert Pain management: pain level controlled Vital Signs Assessment: post-procedure vital signs reviewed and stable Respiratory status: spontaneous breathing, nonlabored ventilation and respiratory function stable Cardiovascular status: blood pressure returned to baseline and stable Postop Assessment: no apparent nausea or vomiting Anesthetic complications: no   No complications documented.  Last Vitals:  Vitals:   06/12/20 1300 06/12/20 1315  BP: (!) 160/70 (!) 159/75  Pulse: (!) 103 (!) 105  Resp: 10 12  Temp:    SpO2: 94% 95%    Last Pain:  Vitals:   06/12/20 1245  TempSrc:   PainSc: Asleep                 Lynda Rainwater

## 2020-06-12 NOTE — Progress Notes (Addendum)
Gastroenterology Progress Note  CC:  Hematochezia, obstructing tumor proximal descending colon per colonoscopy   Subjective: She is s/p laparoscopic assisted transverse colectomy performed earlier this morning. She is awake. No N/V. She is having some abdominal soreness which is expected at this point. No significant abdominal pain. Her daughter is at the bedside.    Objective:   EGD 06/10/2020: - Normal esophagus. - Multiple gastric polyps. Resected and retrieved. - Normal incisura, antrum and pylorus. - Normal examined duodenum. - No areas of active bleeding or high grade stigmata of bleeding on this study.  Colonoscopy 06/10/2020: - One 6 mm polyp in the sigmoid colon, removed with a cold snare. Resected and retrieved. - Malignant partially obstructing tumor in the proximal descending colon. Biopsied. Tattooed. - Diverticulosis in the sigmoid colon. - There was significant looping of the colon. - The distal rectum and anal verge are normal on retroflexion view.  Chest CT 06/10/2020: 1. No acute intrathoracic pathology. 2. Slight interval decrease in the size of the select lymph nodes since the prior CT. No new adenopathy in the chest. 3. Aortic Atherosclerosis   Abdominal/pelvic CT angiogram 06/08/2020:  1. Focal area of inflammation or infiltrative neoplasm involving distal transverse colon. Further evaluation with colonoscopy is recommended. 2. Sigmoid diverticulosis.  No bowel obstruction. 3. Aortic Atherosclerosis    Vital signs in last 24 hours: Temp:  [97.8 F (36.6 C)-98.6 F (37 C)] 98.6 F (37 C) (01/13 0542) Pulse Rate:  [65-67] 65 (01/13 0542) Resp:  [18-20] 18 (01/13 0542) BP: (109-138)/(54-61) 109/54 (01/13 0542) SpO2:  [94 %-98 %] 94 % (01/13 0542) Last BM Date: 06/10/20 General:   Alert in NAD. Heart: RRR, no murmur.  Pulm:  Breath sounds clear.  Abdomen: Soft, mild tenderness post op.  Extremities:  No edema. Neurologic:  Alert  and  oriented x4. Speech is clear moves all extremities.  Psych:  Alert and cooperative. Normal mood and affect.  Intake/Output from previous day: 01/12 0701 - 01/13 0700 In: 579 [P.O.:560; I.V.:19] Out: -  Intake/Output this shift: No intake/output data recorded.  Lab Results: Recent Labs    06/10/20 0551 06/11/20 0557 06/12/20 0627  WBC 7.5 7.7 5.3  HGB 11.8* 9.8* 9.6*  HCT 37.4 31.2* 30.4*  PLT 170 131* 130*   BMET Recent Labs    06/10/20 0551 06/11/20 0557  NA 144 142  K 3.4* 3.0*  CL 115* 114*  CO2 19* 20*  GLUCOSE 106* 97  BUN 8 5*  CREATININE 0.67 0.55  CALCIUM 8.9 8.0*   LFT No results for input(s): PROT, ALBUMIN, AST, ALT, ALKPHOS, BILITOT, BILIDIR, IBILI in the last 72 hours. PT/INR No results for input(s): LABPROT, INR in the last 72 hours. Hepatitis Panel No results for input(s): HEPBSAG, HCVAB, HEPAIGM, HEPBIGM in the last 72 hours.  CT CHEST W CONTRAST  Result Date: 06/10/2020 CLINICAL DATA:  87 year old female with colorectal cancer. EXAM: CT CHEST WITH CONTRAST TECHNIQUE: Multidetector CT imaging of the chest was performed during intravenous contrast administration. CONTRAST:  44mL OMNIPAQUE IOHEXOL 300 MG/ML  SOLN COMPARISON:  CT dated 04/30/2019. FINDINGS: Cardiovascular: Top-normal cardiac size. No pericardial effusion. There is coronary vascular calcification. Moderate atherosclerotic calcification of the thoracic aorta. No aneurysmal dilatation or dissection. The origins of the great vessels of the aortic arch appear patent as visualized. No pulmonary artery embolus identified. Mediastinum/Nodes: 2 adjacent lymph nodes along the right pericardium/posterior cardiophrenic angle noted with the larger measuring 12 mm in short axis (previously  14 mm). A nodular density along the anterior right cardiophrenic angle measures 8 mm in short axis (previously 11 mm). Right anterior costophrenic lymph nodes measure 5 mm short axis (previously 7 mm). No new  adenopathy. Bilateral axillary dissection clips. The esophagus and the thyroid gland are grossly unremarkable. No mediastinal fluid collection. Lungs/Pleura: Bibasilar linear atelectasis/scarring. No focal consolidation, pleural effusion, pneumothorax. The central airways are patent. Upper Abdomen: Cholecystectomy. Several upper abdominal and periportal lymph nodes similar or slightly decreased since the prior CT. Musculoskeletal: Osteopenia with degenerative changes of the spine. No acute osseous pathology. IMPRESSION: 1. No acute intrathoracic pathology. 2. Slight interval decrease in the size of the select lymph nodes since the prior CT. No new adenopathy in the chest. 3. Aortic Atherosclerosis (ICD10-I70.0). Electronically Signed   By: Anner Crete M.D.   On: 06/10/2020 18:20    Assessment / Plan:  54. 87 year old female admitted to the hospital with lower abdominal pain, hematochezia and anemia.No prior history of GI bleed. Admission Hg 11.8 (baseine Hg 13,3).Feraheme x 1 on 1/12. Today Hg 9.6.  Abdominal/pelvic CT angiogram 06/08/2020 showed a focal area of inflammation/infiltrative neoplasm involving distal transverse colon, sigmoid diverticulosis without evidence of diverticulitis. Colonoscopy 1/11 identified a malignant partially obstructing tumor in the proximal descending colon, a 83mm polyp from the sigmoid colon and sigmoid diverticulosis. Pathology report confirmed adenocarcinoma. CEA 6.8. Chest CT showed a slight interval decreased in mediastinal lymph nodes, no new adenopathy. She is s/p laparoscopic assisted transverse colectomy.She is afebrile and hemodynamically stable.  -Pain management per the hospitalist/surgical team  -Repeat colonoscopy 1 year after resection  -Further recommendations per Dr. Bryan Lemma   2. History of adenomatous colon polyps per colonoscopy 09/2015  3. History of GERD. EGD 06/10/2020 showed fundic gland polyps otherwise appeared normal.  -Continue PPI  IV QD, switch to po prior to discharge   4. Remote history of breast cancer. Remote history of melanoma.   5. History of diastolic CHF. LV EF 60 - 65% per ECHO 07/2019     Principal Problem:   Lower GI bleed Active Problems:   GERD (gastroesophageal reflux disease)   Acute blood loss anemia   HTN (hypertension)   Acute lower GI bleeding   Gastric polyps   Colonic mass   Diverticulosis of colon without hemorrhage   Adenomatous polyp of sigmoid colon   Malignant neoplasm of descending colon ([place])     LOS: 3 days   Noralyn Pick  06/12/2020, 2:29PM

## 2020-06-12 NOTE — Transfer of Care (Signed)
Immediate Anesthesia Transfer of Care Note  Patient: Crystal Lee  Procedure(s) Performed: LAPAROSCOPIC COLON RESECTION (N/A )  Patient Location: PACU  Anesthesia Type:General  Level of Consciousness: awake, alert , oriented and patient cooperative  Airway & Oxygen Therapy: Patient Spontanous Breathing and Patient connected to face mask  Post-op Assessment: Report given to RN and Post -op Vital signs reviewed and stable  Post vital signs: Reviewed and stable  Last Vitals:  Vitals Value Taken Time  BP 158/70 06/12/20 1145  Temp    Pulse 108 06/12/20 1148  Resp 18 06/12/20 1148  SpO2 91 % 06/12/20 1148  Vitals shown include unvalidated device data.  Last Pain:  Vitals:   06/12/20 0821  TempSrc:   PainSc: 5       Patients Stated Pain Goal: 0 (98/11/91 4782)  Complications: No complications documented.

## 2020-06-12 NOTE — Anesthesia Procedure Notes (Signed)
Procedure Name: Intubation Date/Time: 06/12/2020 9:45 AM Performed by: Claudia Desanctis, CRNA Pre-anesthesia Checklist: Patient identified, Emergency Drugs available, Suction available and Patient being monitored Patient Re-evaluated:Patient Re-evaluated prior to induction Oxygen Delivery Method: Circle system utilized Preoxygenation: Pre-oxygenation with 100% oxygen Induction Type: IV induction Ventilation: Mask ventilation without difficulty Laryngoscope Size: 2 and Miller Grade View: Grade I Tube type: Oral Tube size: 7.0 mm Number of attempts: 1 Airway Equipment and Method: Stylet Placement Confirmation: ETT inserted through vocal cords under direct vision,  positive ETCO2 and breath sounds checked- equal and bilateral Secured at: 21 cm Tube secured with: Tape Dental Injury: Teeth and Oropharynx as per pre-operative assessment

## 2020-06-12 NOTE — Progress Notes (Signed)
PROGRESS NOTE    Crystal Lee  GYI:[id number] DOB: 11/21/1933 DOA: 06/08/2020 PCP: Darreld Mclean, MD    Chief Complaint  Patient presents with  . Rectal Bleeding    Brief Narrative: 87 year old lady with prior history of hypertension hyperlipidemia stage I breast cancer, melanoma GERD, osteoporosis, colon polyps presents to ED with rectal bleeding.  GI consulted patient underwent colonoscopy and was found to have a mass at the splenic flexure suspicious for malignancy.  General surgery consulted and she is scheduled for laparoscopic assisted partial colectomy.  Assessment & Plan:   Principal Problem:   Lower GI bleed Active Problems:   GERD (gastroesophageal reflux disease)   Acute blood loss anemia   HTN (hypertension)   Acute lower GI bleeding   Gastric polyps   Colonic mass   Diverticulosis of colon without hemorrhage   Adenomatous polyp of sigmoid colon   Malignant neoplasm of descending colon (HCC)   Lower GI bleed/hematochezia with prior history of sigmoid diverticulosis.  GI consulted underwent EGD and colonoscopy was found to have a malignant mass at the splenic flexure of the colon. Biopsy results showed adenocarcinoma. General surgery consulted and she is scheduled for laparoscopic partial colectomy.    Malignant mass in the colon at the splenic flexure Scheduled for surgery today and oncology on board.    Acute blood loss anemia Patient baseline hemoglobin around 13 admitted with a hemoglobin of 11.8, dropped to hemoglobin of 9.6. Transfuse to keep hemoglobin greater than 8.    Mild thrombocytopenia:  Continue to monitor.    Hypertension:  Well controlled.    H/o diverticulosis.  No bleeding overnight.     Hypokalemia:  Replaced.    GERD:  Stable on PPI .   DVT prophylaxis: Lovenox  code Status: Full code Family Communication: none at bedside.  Disposition:   Status is: Inpatient  Remains inpatient appropriate because:Ongoing  diagnostic testing needed not appropriate for outpatient work up, IV treatments appropriate due to intensity of illness or inability to take PO and Inpatient level of care appropriate due to severity of illness   Dispo: The patient is from: Home              Anticipated d/c is to: pending.               Anticipated d/c date is: 3 days              Patient currently is not medically stable to d/c.       Consultants:   General surgery.    Procedures:  Scheduled for laparoscopic assisted partial colectomy   Antimicrobials: none.    Subjective: No new complaints.   Objective: Vitals:   06/11/20 0603 06/11/20 1600 06/11/20 2155 06/12/20 0542  BP: (!) 139/55 (!) 138/56 127/61 (!) 109/54  Pulse: 61  67 65  Resp: 16  20 18   Temp: 97.9 F (36.6 C) 97.8 F (36.6 C) 98.4 F (36.9 C) 98.6 F (37 C)  TempSrc: Oral Oral Oral Oral  SpO2: 98% 98% 96% 94%  Weight:      Height:        Intake/Output Summary (Last 24 hours) at 06/12/2020 0740 Last data filed at 06/11/2020 1831 Gross per 24 hour  Intake 579 ml  Output -  Net 579 ml   Filed Weights   06/08/20 1131 06/08/20 1935  Weight: 97.5 kg 99.9 kg    Examination:  General exam: Appears calm and comfortable  Respiratory system: Clear to auscultation.  Respiratory effort normal. Cardiovascular system: S1 & S2 heard, RRR.  No pedal edema. Gastrointestinal system: Abdomen is nondistended, soft and nontender.  Normal bowel sounds heard. Central nervous system: Alert and oriented. No focal neurological deficits. Extremities: Symmetric 5 x 5 power. Skin: No rashes, lesions or ulcers Psychiatry:  Mood & affect appropriate.     Data Reviewed: I have personally reviewed following labs and imaging studies  CBC: Recent Labs  Lab 06/08/20 1211 06/08/20 1717 06/09/20 0033 06/09/20 0723 06/10/20 0551 06/11/20 0557 06/12/20 0627  WBC 7.4 7.0  --   --  7.5 7.7 5.3  HGB 11.8* 10.8* 10.2* 10.1* 11.8* 9.8* 9.6*  HCT 36.4  33.0* 31.6* 31.4* 37.4 31.2* 30.4*  MCV 89.4 89.4  --   --  93.0 94.5 92.1  PLT 161 148*  --   --  170 131* 130*    Basic Metabolic Panel: Recent Labs  Lab 06/08/20 1211 06/10/20 0551 06/11/20 0557 06/12/20 0627  NA 137 144 142 140  K 4.2 3.4* 3.0* 3.6  CL 104 115* 114* 112*  CO2 23 19* 20* 19*  GLUCOSE 114* 106* 97 92  BUN 14 8 5* <5*  CREATININE 0.61 0.67 0.55 0.54  CALCIUM 9.0 8.9 8.0* 8.2*    GFR: Estimated Creatinine Clearance: 62.4 mL/min (by C-G formula based on SCr of 0.54 mg/dL).  Liver Function Tests: Recent Labs  Lab 06/08/20 1211 06/12/20 0627  AST 37 21  ALT 25 16  ALKPHOS 71 41  BILITOT 0.4 0.7  PROT 7.2 5.4*  ALBUMIN 3.8 3.0*    CBG: No results for input(s): GLUCAP in the last 168 hours.   Recent Results (from the past 240 hour(s))  Resp Panel by RT-PCR (Flu A&B, Covid)     Status: None   Collection Time: 06/08/20  4:53 PM   Specimen: Nasopharyngeal(NP) swabs in vial transport medium  Result Value Ref Range Status   SARS Coronavirus 2 by RT PCR NEGATIVE NEGATIVE Final    Comment: (NOTE) SARS-CoV-2 target nucleic acids are NOT DETECTED.  The SARS-CoV-2 RNA is generally detectable in upper respiratory specimens during the acute phase of infection. The lowest concentration of SARS-CoV-2 viral copies this assay can detect is 138 copies/mL. A negative result does not preclude SARS-Cov-2 infection and should not be used as the sole basis for treatment or other patient management decisions. A negative result may occur with  improper specimen collection/handling, submission of specimen other than nasopharyngeal swab, presence of viral mutation(s) within the areas targeted by this assay, and inadequate number of viral copies(<138 copies/mL). A negative result must be combined with clinical observations, patient history, and epidemiological information. The expected result is Negative.  Fact Sheet for Patients:   EntrepreneurPulse.com.au  Fact Sheet for Healthcare Providers:  IncredibleEmployment.be  This test is no t yet approved or cleared by the [place] FDA and  has been authorized for detection and/or diagnosis of SARS-CoV-2 by FDA under an Emergency Use Authorization (EUA). This EUA will remain  in effect (meaning this test can be used) for the duration of the COVID-19 declaration under Section 564(b)(1) of the Act, 21 U.S.C.section 360bbb-3(b)(1), unless the authorization is terminated  or revoked sooner.       Influenza A by PCR NEGATIVE NEGATIVE Final   Influenza B by PCR NEGATIVE NEGATIVE Final    Comment: (NOTE) The Xpert Xpress SARS-CoV-2/FLU/RSV plus assay is intended as an aid in the diagnosis of influenza from Nasopharyngeal swab specimens and should not be used  as a sole basis for treatment. Nasal washings and aspirates are unacceptable for Xpert Xpress SARS-CoV-2/FLU/RSV testing.  Fact Sheet for Patients: EntrepreneurPulse.com.au  Fact Sheet for Healthcare Providers: IncredibleEmployment.be  This test is not yet approved or cleared by the [place] FDA and has been authorized for detection and/or diagnosis of SARS-CoV-2 by FDA under an Emergency Use Authorization (EUA). This EUA will remain in effect (meaning this test can be used) for the duration of the COVID-19 declaration under Section 564(b)(1) of the Act, 21 U.S.C. section 360bbb-3(b)(1), unless the authorization is terminated or revoked.  Performed at [facility], [address] [place], [place] [address]   Culture, Urine     Status: Abnormal   Collection Time: 06/08/20  8:18 PM   Specimen: Urine, Clean Catch  Result Value Ref Range Status   Specimen Description   Final    URINE, CLEAN CATCH Performed at [facility], [place] [address] [place], [place] [address]    Special Requests   Final     NONE Performed at [facility], [place] [address] [place], [place] [address]    Culture (A)  Final    <10,000 COLONIES/mL INSIGNIFICANT GROWTH Performed at [place] [address], [place], [place] [address]    Report Status 06/10/2020 FINAL  Final         Radiology Studies: CT CHEST W CONTRAST  Result Date: 06/10/2020 CLINICAL DATA:  87 year old female with colorectal cancer. EXAM: CT CHEST WITH CONTRAST TECHNIQUE: Multidetector CT imaging of the chest was performed during intravenous contrast administration. CONTRAST:  31mL OMNIPAQUE IOHEXOL 300 MG/ML  SOLN COMPARISON:  CT dated 04/30/2019. FINDINGS: Cardiovascular: Top-normal cardiac size. No pericardial effusion. There is coronary vascular calcification. Moderate atherosclerotic calcification of the thoracic aorta. No aneurysmal dilatation or dissection. The origins of the great vessels of the aortic arch appear patent as visualized. No pulmonary artery embolus identified. Mediastinum/Nodes: 2 adjacent lymph nodes along the right pericardium/posterior cardiophrenic angle noted with the larger measuring 12 mm in short axis (previously 14 mm). A nodular density along the anterior right cardiophrenic angle measures 8 mm in short axis (previously 11 mm). Right anterior costophrenic lymph nodes measure 5 mm short axis (previously 7 mm). No new adenopathy. Bilateral axillary dissection clips. The esophagus and the thyroid gland are grossly unremarkable. No mediastinal fluid collection. Lungs/Pleura: Bibasilar linear atelectasis/scarring. No focal consolidation, pleural effusion, pneumothorax. The central airways are patent. Upper Abdomen: Cholecystectomy. Several upper abdominal and periportal lymph nodes similar or slightly decreased since the prior CT. Musculoskeletal: Osteopenia with degenerative changes of the spine. No acute osseous pathology. IMPRESSION: 1. No acute intrathoracic pathology. 2. Slight interval decrease  in the size of the select lymph nodes since the prior CT. No new adenopathy in the chest. 3. Aortic Atherosclerosis (ICD10-I70.0). Electronically Signed   By: Anner Crete M.D.   On: 06/10/2020 18:20        Scheduled Meds: . alvimopan  12 mg Oral On Call to OR  . enoxaparin (LOVENOX) injection  40 mg Subcutaneous On Call to OR  . feeding supplement  296 mL Oral Once  . fenofibrate  54 mg Oral Daily  . losartan  25 mg Oral Daily  . pantoprazole (PROTONIX) IV  40 mg Intravenous Q24H   Continuous Infusions: . sodium chloride 20 mL/hr at 06/11/20 1607  . cefoTEtan (CEFOTAN) IV       LOS: 3 days  Hosie Poisson, MD [facility]   To contact the attending provider between 7A-7P or the covering provider during after hours 7P-7A, please log into the web site [email or web address] and access using universal [place] password for that web site. If you do not have the password, please call the hospital operator.  06/12/2020, 7:40 AM

## 2020-06-12 NOTE — Anesthesia Preprocedure Evaluation (Signed)
Anesthesia Evaluation  Patient identified by MRN, date of birth, ID band Patient awake    Reviewed: Allergy & Precautions, NPO status , Patient's Chart, lab work & pertinent test results  History of Anesthesia Complications (+) PONV and history of anesthetic complications  Airway Mallampati: II  TM Distance: >3 FB Neck ROM: Full    Dental  (+) Teeth Intact   Pulmonary sleep apnea , former smoker,    Pulmonary exam normal        Cardiovascular hypertension, Pt. on medications  Rhythm:Regular Rate:Normal     Neuro/Psych  Neuromuscular disease    GI/Hepatic GERD  ,  Endo/Other    Renal/GU      Musculoskeletal  (+) Arthritis ,   Abdominal Normal abdominal exam  (+)   Peds  Hematology   Anesthesia Other Findings   Reproductive/Obstetrics                             Anesthesia Physical  Anesthesia Plan  ASA: III  Anesthesia Plan: General   Post-op Pain Management:    Induction: Intravenous  PONV Risk Score and Plan: 4 or greater and Ondansetron, Dexamethasone, Midazolam, Droperidol and Treatment may vary due to age or medical condition  Airway Management Planned: Oral ETT  Additional Equipment: None  Intra-op Plan:   Post-operative Plan: Extubation in OR  Informed Consent: I have reviewed the patients History and Physical, chart, labs and discussed the procedure including the risks, benefits and alternatives for the proposed anesthesia with the patient or authorized representative who has indicated his/her understanding and acceptance.       Plan Discussed with: CRNA  Anesthesia Plan Comments: (Echo:  1. Left ventricular ejection fraction, by estimation, is 60 to 65%. The  left ventricle has normal function. The left ventricle has no regional  wall motion abnormalities. There is mild left ventricular hypertrophy.  Left ventricular diastolic parameters  were normal.   2. The mitral valve is normal in structure and function. No evidence of  mitral valve regurgitation. No evidence of mitral stenosis.  3. The aortic valve is normal in structure and function. Aortic valve  regurgitation is not visualized. No aortic stenosis is present.  4. There is mild dilatation of the ascending aorta measuring 38 mm.  5. The inferior vena cava is normal in size with greater than 50%  respiratory variability, suggesting right atrial pressure of 3 mmHg. )        Anesthesia Quick Evaluation

## 2020-06-12 NOTE — Progress Notes (Signed)
Patient ID: Crystal Lee, female   DOB: 01/27/1934, 87 y.o.   MRN: [id number]   Pre Procedure note for inpatients:   Crystal Lee has been scheduled for Procedure(s): LAPAROSCOPIC COLON RESECTION (N/A) today. The various methods of treatment have been discussed with the patient. After consideration of the risks, benefits and treatment options the patient has consented to the planned procedure.   The patient has been seen and labs reviewed. There are no changes in the patient's condition to prevent proceeding with the planned procedure today.  Recent labs:  Lab Results  Component Value Date   WBC 5.3 06/12/2020   HGB 9.6 (L) 06/12/2020   HCT 30.4 (L) 06/12/2020   PLT 130 (L) 06/12/2020   GLUCOSE 92 06/12/2020   CHOL 175 12/05/2019   TRIG 184 (H) 12/05/2019   HDL 36 (L) 12/05/2019   LDLCALC 102 (H) 12/05/2019   ALT 16 06/12/2020   AST 21 06/12/2020   NA 140 06/12/2020   K 3.6 06/12/2020   CL 112 (H) 06/12/2020   CREATININE 0.54 06/12/2020   BUN <5 (L) 06/12/2020   CO2 19 (L) 06/12/2020   TSH 2.540 08/10/2019   INR 0.99 05/05/2015   HGBA1C 5.4 02/09/2017    [name], MD 06/12/2020 8:55 AM

## 2020-06-13 ENCOUNTER — Encounter (HOSPITAL_COMMUNITY): Payer: Self-pay | Admitting: Surgery

## 2020-06-13 DIAGNOSIS — C186 Malignant neoplasm of descending colon: Secondary | ICD-10-CM

## 2020-06-13 DIAGNOSIS — K922 Gastrointestinal hemorrhage, unspecified: Secondary | ICD-10-CM | POA: Diagnosis not present

## 2020-06-13 LAB — BASIC METABOLIC PANEL
Anion gap: 5 (ref 5–15)
BUN: <5 mg/dL — ABNORMAL LOW (ref 8–23)
CO2: 23 mmol/L (ref 22–32)
Calcium: 8 mg/dL — ABNORMAL LOW (ref 8.9–10.3)
Chloride: 112 mmol/L — ABNORMAL HIGH (ref 98–111)
Creatinine, Ser: 0.52 mg/dL (ref 0.44–1.00)
GFR, Estimated: >60 mL/min (ref >60)
Glucose, Bld: 117 mg/dL — ABNORMAL HIGH (ref 70–99)
Potassium: 3.4 mmol/L — ABNORMAL LOW (ref 3.5–5.1)
Sodium: 140 mmol/L (ref 135–145)

## 2020-06-13 LAB — CBC
HCT: 29.5 % — ABNORMAL LOW (ref 36.0–46.0)
Hemoglobin: 9.5 g/dL — ABNORMAL LOW (ref 12.0–15.0)
MCH: 29.3 pg (ref 26.0–34.0)
MCHC: 32.2 g/dL (ref 30.0–36.0)
MCV: 91 fL (ref 80.0–100.0)
Platelets: 141 10*3/uL — ABNORMAL LOW (ref 150–400)
RBC: 3.24 MIL/uL — ABNORMAL LOW (ref 3.87–5.11)
RDW: 13.5 % (ref 11.5–15.5)
WBC: 9.2 10*3/uL (ref 4.0–10.5)
nRBC: 0 % (ref 0.0–0.2)

## 2020-06-13 NOTE — Progress Notes (Signed)
PROGRESS NOTE    Crystal Lee  KGM:[id number] DOB: Jul 07, 1933 DOA: 06/08/2020 PCP: Darreld Mclean, MD    Chief Complaint  Patient presents with  . Rectal Bleeding    Brief Narrative: 87 year old lady with prior history of hypertension hyperlipidemia stage I breast cancer, melanoma GERD, osteoporosis, colon polyps presents to ED with rectal bleeding.  GI consulted patient underwent colonoscopy and was found to have a mass at the splenic flexure suspicious for malignancy.  General surgery consulted and she is underwent laparoscopic assisted partial colectomy on 06/12/20. No new complaints this am except for emesis earlier this am.   Assessment & Plan:   Principal Problem:   Lower GI bleed Active Problems:   GERD (gastroesophageal reflux disease)   Acute blood loss anemia   HTN (hypertension)   Acute lower GI bleeding   Gastric polyps   Colonic mass   Diverticulosis of colon without hemorrhage   Adenomatous polyp of sigmoid colon   Malignant neoplasm of descending colon (HCC)   Lower GI bleed/hematochezia with prior history of sigmoid diverticulosis.  GI consulted underwent EGD and colonoscopy was found to have a malignant mass at the splenic flexure of the colon. Biopsy results showed adenocarcinoma. General surgery consulted and she underwent laparoscopic partial colectomy. She was found to have mid transverse colon mass, with thickened mesentry with numerous enlarged LN, nodular liver.     Malignant mass in the colon at the splenic flexure  she underwent laparoscopic partial colectomy on 06/12/20. She was found to have mid transverse colon mass, with thickened mesentry with numerous enlarged LN, nodular liver.      Acute blood loss anemia Patient baseline hemoglobin around 13 admitted with a hemoglobin of 11.8, dropped to hemoglobin of 9.5. Transfuse to keep hemoglobin greater than 8.    Mild thrombocytopenia:  Improving platelet count.  Continue to monitor.     Hypertension:  Well controlled.    H/o diverticulosis.  No bleeding overnight.     Hypokalemia:  Replaced. Repeat BMP in am.    GERD:  Stable on PPI .   DVT prophylaxis: Lovenox  code Status: Full code Family Communication: none at bedside.  Disposition:   Status is: Inpatient  Remains inpatient appropriate because:Ongoing diagnostic testing needed not appropriate for outpatient work up, IV treatments appropriate due to intensity of illness or inability to take PO and Inpatient level of care appropriate due to severity of illness   Dispo: The patient is from: Home              Anticipated d/c is to: pending.               Anticipated d/c date is: 3 days              Patient currently is not medically stable to d/c.       Consultants:   General surgery.    Procedures:  Scheduled for laparoscopic assisted partial colectomy   Antimicrobials: none.    Subjective: Some nausea, 1 episode of vomiting.   Objective: Vitals:   06/13/20 0155 06/13/20 0500 06/13/20 0630 06/13/20 1339  BP: (!) 130/57  (!) 138/55 (!) 118/53  Pulse: 77  70 79  Resp: 14  14 17   Temp: 98.6 F (37 C)  97.9 F (36.6 C) 98.7 F (37.1 C)  TempSrc: Oral  Oral Oral  SpO2: 92%  93% 96%  Weight:  101.5 kg    Height:        Intake/Output Summary (Last  24 hours) at 06/13/2020 1511 Last data filed at 06/13/2020 0941 Gross per 24 hour  Intake 1415.06 ml  Output 1200 ml  Net 215.06 ml   Filed Weights   06/08/20 1131 06/08/20 1935 06/13/20 0500  Weight: 97.5 kg 99.9 kg 101.5 kg    Examination:  General exam: Alert and comfortable Respiratory system: Clear to auscultation bilaterally, no wheezing or rhonchi Cardiovascular system: S1-S2 heard, regular rate rhythm, no JVD, no pedal edema Gastrointestinal system: Abdomen is soft, non distended, mildly tender, bowel sounds minimal Central nervous system: Alert and oriented Extremities: no pedal edema. . Skin: no rashes seen.   Psychiatry:  Mood is appropriate.      Data Reviewed: I have personally reviewed following labs and imaging studies  CBC: Recent Labs  Lab 06/08/20 1717 06/09/20 0033 06/09/20 0723 06/10/20 0551 06/11/20 0557 06/12/20 0627 06/13/20 0617  WBC 7.0  --   --  7.5 7.7 5.3 9.2  HGB 10.8*   < > 10.1* 11.8* 9.8* 9.6* 9.5*  HCT 33.0*   < > 31.4* 37.4 31.2* 30.4* 29.5*  MCV 89.4  --   --  93.0 94.5 92.1 91.0  PLT 148*  --   --  170 131* 130* 141*   < > = values in this interval not displayed.    Basic Metabolic Panel: Recent Labs  Lab 06/08/20 1211 06/10/20 0551 06/11/20 0557 06/12/20 0627 06/13/20 0617  NA 137 144 142 140 140  K 4.2 3.4* 3.0* 3.6 3.4*  CL 104 115* 114* 112* 112*  CO2 23 19* 20* 19* 23  GLUCOSE 114* 106* 97 92 117*  BUN 14 8 5* <5* <5*  CREATININE 0.61 0.67 0.55 0.54 0.52  CALCIUM 9.0 8.9 8.0* 8.2* 8.0*    GFR: Estimated Creatinine Clearance: 63 mL/min (by C-G formula based on SCr of 0.52 mg/dL).  Liver Function Tests: Recent Labs  Lab 06/08/20 1211 06/12/20 0627  AST 37 21  ALT 25 16  ALKPHOS 71 41  BILITOT 0.4 0.7  PROT 7.2 5.4*  ALBUMIN 3.8 3.0*    CBG: No results for input(s): GLUCAP in the last 168 hours.   Recent Results (from the past 240 hour(s))  Resp Panel by RT-PCR (Flu A&B, Covid)     Status: None   Collection Time: 06/08/20  4:53 PM   Specimen: Nasopharyngeal(NP) swabs in vial transport medium  Result Value Ref Range Status   SARS Coronavirus 2 by RT PCR NEGATIVE NEGATIVE Final    Comment: (NOTE) SARS-CoV-2 target nucleic acids are NOT DETECTED.  The SARS-CoV-2 RNA is generally detectable in upper respiratory specimens during the acute phase of infection. The lowest concentration of SARS-CoV-2 viral copies this assay can detect is 138 copies/mL. A negative result does not preclude SARS-Cov-2 infection and should not be used as the sole basis for treatment or other patient management decisions. A negative result may  occur with  improper specimen collection/handling, submission of specimen other than nasopharyngeal swab, presence of viral mutation(s) within the areas targeted by this assay, and inadequate number of viral copies(<138 copies/mL). A negative result must be combined with clinical observations, patient history, and epidemiological information. The expected result is Negative.  Fact Sheet for Patients:  EntrepreneurPulse.com.au  Fact Sheet for Healthcare Providers:  IncredibleEmployment.be  This test is no t yet approved or cleared by the [place] FDA and  has been authorized for detection and/or diagnosis of SARS-CoV-2 by FDA under an Emergency Use Authorization (EUA). This EUA will remain  in effect (meaning this test can be used) for the duration of the COVID-19 declaration under Section 564(b)(1) of the Act, 21 U.S.C.section 360bbb-3(b)(1), unless the authorization is terminated  or revoked sooner.       Influenza A by PCR NEGATIVE NEGATIVE Final   Influenza B by PCR NEGATIVE NEGATIVE Final    Comment: (NOTE) The Xpert Xpress SARS-CoV-2/FLU/RSV plus assay is intended as an aid in the diagnosis of influenza from Nasopharyngeal swab specimens and should not be used as a sole basis for treatment. Nasal washings and aspirates are unacceptable for Xpert Xpress SARS-CoV-2/FLU/RSV testing.  Fact Sheet for Patients: EntrepreneurPulse.com.au  Fact Sheet for Healthcare Providers: IncredibleEmployment.be  This test is not yet approved or cleared by the [place] FDA and has been authorized for detection and/or diagnosis of SARS-CoV-2 by FDA under an Emergency Use Authorization (EUA). This EUA will remain in effect (meaning this test can be used) for the duration of the COVID-19 declaration under Section 564(b)(1) of the Act, 21 U.S.C. section 360bbb-3(b)(1), unless the authorization is terminated  or revoked.  Performed at [facility], [address] [place], [place] [address]   Culture, Urine     Status: Abnormal   Collection Time: 06/08/20  8:18 PM   Specimen: Urine, Clean Catch  Result Value Ref Range Status   Specimen Description   Final    URINE, CLEAN CATCH Performed at [facility], [place] [address], [place], [place] [address]    Special Requests   Final    NONE Performed at [facility], [place] [address], [place], [place] [address]    Culture (A)  Final    <10,000 COLONIES/mL INSIGNIFICANT GROWTH Performed at [place] [address] [place],  [address]    Report Status 06/10/2020 FINAL  Final         Radiology Studies: No results found.      Scheduled Meds: . alvimopan  12 mg Oral BID  . celecoxib  200 mg Oral BID  . Chlorhexidine Gluconate Cloth  6 each Topical Daily  . enoxaparin (LOVENOX) injection  40 mg Subcutaneous Q24H  . feeding supplement  237 mL Oral BID BM  . fenofibrate  54 mg Oral Daily  . gabapentin  300 mg Oral BID  . losartan  25 mg Oral Daily  . pantoprazole (PROTONIX) IV  40 mg Intravenous Q24H   Continuous Infusions: . 0.9 % NaCl with KCl 20 mEq / L 75 mL/hr at 06/13/20 0328     LOS: 4 days        Hosie Poisson, MD [facility]   To contact the attending provider between 7A-7P or the covering provider during after hours 7P-7A, please log into the web site [email or web address] and access using universal [place] password for that web site. If you do not have the password, please call the hospital operator.  06/13/2020, 3:11 PM

## 2020-06-13 NOTE — Progress Notes (Signed)
1 Day Post-Op   Subjective/Chief Complaint: One episode of emesis at 4 am this morning. Feels better now Minimal pain   Objective: Vital signs in last 24 hours: Temp:  [97.8 F (36.6 C)-98.7 F (37.1 C)] 97.9 F (36.6 C) (01/14 0630) Pulse Rate:  [70-112] 70 (01/14 0630) Resp:  [10-22] 14 (01/14 0630) BP: (130-168)/(52-84) 138/55 (01/14 0630) SpO2:  [92 %-98 %] 93 % (01/14 0630) Weight:  [101.5 kg] 101.5 kg (01/14 0500) Last BM Date: 06/10/20  Intake/Output from previous day: 01/13 0701 - 01/14 0700 In: 2043.1 [I.V.:1943.1; IV Piggyback:100] Out: 0973 [Urine:1450; Blood:20] Intake/Output this shift: No intake/output data recorded.  Exam: Awake and alert Abdomen soft, non distended, minimally tender Incision clean  Lab Results:  Recent Labs    06/12/20 0627 06/13/20 0617  WBC 5.3 9.2  HGB 9.6* 9.5*  HCT 30.4* 29.5*  PLT 130* 141*   BMET Recent Labs    06/11/20 0557 06/12/20 0627  NA 142 140  K 3.0* 3.6  CL 114* 112*  CO2 20* 19*  GLUCOSE 97 92  BUN 5* <5*  CREATININE 0.55 0.54  CALCIUM 8.0* 8.2*   PT/INR No results for input(s): LABPROT, INR in the last 72 hours. ABG No results for input(s): PHART, HCO3 in the last 72 hours.  Invalid input(s): PCO2, PO2  Studies/Results: No results found.  Anti-infectives: Anti-infectives (From admission, onward)   Start     Dose/Rate Route Frequency Ordered Stop   06/12/20 0600  cefoTEtan (CEFOTAN) 2 g in sodium chloride 0.9 % 100 mL IVPB        2 g 200 mL/hr over 30 Minutes Intravenous On call to O.R. 06/11/20 1417 06/12/20 1002      Assessment/Plan: Colon cancer  S/p Lap assisted transverse colectomy on 1/13  POD#1  Keep on clear liquids given emesis Ambulate Foley out     LOS: 4 days    [name] 06/13/2020

## 2020-06-13 NOTE — Progress Notes (Signed)
Patient ambulated 3 times this shift for about 10 minutes each ambulation session. Patient has had no complaints of nauseas and no vomiting this shift. Patient is belching and passing gas from rectum. No complete bowel movement as of yet, just smear. Will continue to follow current treatment plan and update as necessary.

## 2020-06-13 NOTE — Care Management Important Message (Signed)
Important Message  Patient Details IM Letter given to the Patient. Name: Crystal Lee MRN: [id number] Date of Birth: [birth year]   Medicare Important Message Given:  Yes     Kerin Salen 06/13/2020, 11:05 AM

## 2020-06-13 NOTE — Progress Notes (Signed)
  Crystal Lee got through her surgery quite nicely.  I very much appreciate the outstanding care from the our surgery service.  The pathology report will be out probably early next week.  Hopefully, the hard lymph nodes that were found are going to be reactive.  She has some vomiting this morning.  When I listened to her abdomen this morning, I really cannot pick up any bowel sounds.  I told her this is to be expected.  I think she is on some liquids.  She is walking.  The nursing staff on [address] are doing a great job with her.  She has had no fever.  There is no cough.  She has had no shortness of breath.  All of her vital signs are stable.  Blood pressure is 138/55.  Temperature 97.9.  Pulse is 70.  Her lungs are clear bilaterally.  Cardiac exam regular rate and rhythm.  Her abdomen is soft.  She has a laparoscopy scars.  They are healing.  She has absent bowel sounds.  There is no guarding or rebound tenderness.  Extremity shows no clubbing, cyanosis or edema.  Neurological exam is nonfocal.  I would expect that Crystal Lee will be in the hospital over the weekend.  Hopefully, she will start having some flatus.  She is walking.  This will help.  Again, she is getting outstanding care from all the staff up on [address].   Lattie Haw, MD  Job 5:19

## 2020-06-13 NOTE — Progress Notes (Signed)
Patient had episode of emesis throughout night. Belching but not passing gas. Minimal bowel sounds. Medicated with zofran and patient reported relief.  Patient ambulated in hall 1 time. Tolerated well. No other complaints or signs of distress noted. Patient laying in bed resting quietly.

## 2020-06-14 DIAGNOSIS — K922 Gastrointestinal hemorrhage, unspecified: Secondary | ICD-10-CM | POA: Diagnosis not present

## 2020-06-14 DIAGNOSIS — C186 Malignant neoplasm of descending colon: Secondary | ICD-10-CM | POA: Diagnosis not present

## 2020-06-14 LAB — BASIC METABOLIC PANEL
Anion gap: 7 (ref 5–15)
BUN: 5 mg/dL — ABNORMAL LOW (ref 8–23)
CO2: 22 mmol/L (ref 22–32)
Calcium: 8.1 mg/dL — ABNORMAL LOW (ref 8.9–10.3)
Chloride: 112 mmol/L — ABNORMAL HIGH (ref 98–111)
Creatinine, Ser: 0.58 mg/dL (ref 0.44–1.00)
GFR, Estimated: >60 mL/min (ref >60)
Glucose, Bld: 124 mg/dL — ABNORMAL HIGH (ref 70–99)
Potassium: 3.5 mmol/L (ref 3.5–5.1)
Sodium: 141 mmol/L (ref 135–145)

## 2020-06-14 MED ORDER — PANTOPRAZOLE SODIUM 40 MG PO TBEC
40.0000 mg | DELAYED_RELEASE_TABLET | Freq: Every day | ORAL | Status: DC
  Administered 2020-06-14 – 2020-06-16 (×3): 40 mg via ORAL
  Filled 2020-06-14 (×3): qty 1

## 2020-06-14 NOTE — Progress Notes (Signed)

## 2020-06-14 NOTE — Progress Notes (Signed)
Overall, Crystal Lee is doing well.  She still has not had any flatus.  She still has some nausea when she lies down.  I do hear more bowel sounds although they are decreased.  There is no complaints of abdominal pain.  There is no bleeding.  She has had no cough or shortness of breath.  She is using her incentive spirometer.  There are no labs yet today.  Yesterday, her hemoglobin was 9.5.  There is no pathology back yet on the colonic resection.  Pathology on the colonoscopy does show adenocarcinoma.  All of her vital signs are stable.  Blood pressure 140/62.  Temperature 98.2.  Again, she, in my mind, is progressing as I expected.  The bowel sounds are a little bit better.  Hopefully, she will get to increase her diet a little bit.  I know that she is walking.  Hopefully, she will be able to walk a little bit more this weekend.  I know the staff upon [address] are doing a fantastic job with her.  I appreciate all of their compassion.  Lattie Haw, MD  Hebrews 12:12

## 2020-06-14 NOTE — Progress Notes (Signed)
2 Days Post-Op   Subjective/Chief Complaint: Had some nausea last night after full liquids, better this morning No flatus yet   Objective: Vital signs in last 24 hours: Temp:  [98.2 F (36.8 C)-99.2 F (37.3 C)] 98.2 F (36.8 C) (01/15 0535) Pulse Rate:  [79-91] 79 (01/15 0535) Resp:  [14-17] 14 (01/15 0535) BP: (118-140)/(52-62) 140/62 (01/15 0535) SpO2:  [94 %-96 %] 94 % (01/15 0535) Last BM Date: 06/10/20  Intake/Output from previous day: 01/14 0701 - 01/15 0700 In: 472 [P.O.:472] Out: -  Intake/Output this shift: No intake/output data recorded.  Exam: Awake and alert Abdomen soft, dressing dry  Lab Results:  Recent Labs    06/12/20 0627 06/13/20 0617  WBC 5.3 9.2  HGB 9.6* 9.5*  HCT 30.4* 29.5*  PLT 130* 141*   BMET Recent Labs    06/12/20 0627 06/13/20 0617  NA 140 140  K 3.6 3.4*  CL 112* 112*  CO2 19* 23  GLUCOSE 92 117*  BUN <5* <5*  CREATININE 0.54 0.52  CALCIUM 8.2* 8.0*   PT/INR No results for input(s): LABPROT, INR in the last 72 hours. ABG No results for input(s): PHART, HCO3 in the last 72 hours.  Invalid input(s): PCO2, PO2  Studies/Results: No results found.  Anti-infectives: Anti-infectives (From admission, onward)   Start     Dose/Rate Route Frequency Ordered Stop   06/12/20 0600  cefoTEtan (CEFOTAN) 2 g in sodium chloride 0.9 % 100 mL IVPB        2 g 200 mL/hr over 30 Minutes Intravenous On call to O.R. 06/11/20 1417 06/12/20 1002      Assessment/Plan: s/p Procedure(s): LAPAROSCOPIC COLON RESECTION (N/A)  Keep of full liquids today Decrease IVF Ambulate Awaiting final path  LOS: 5 days    [name] 06/14/2020

## 2020-06-14 NOTE — Progress Notes (Signed)
PROGRESS NOTE    Crystal Lee  PFX:[id number] DOB: 1933-08-21 DOA: 06/08/2020 PCP: Darreld Mclean, MD    Chief Complaint  Patient presents with  . Rectal Bleeding    Brief Narrative: 87 year old lady with prior history of hypertension hyperlipidemia stage I breast cancer, melanoma GERD, osteoporosis, colon polyps presents to ED with rectal bleeding.  GI consulted patient underwent colonoscopy and was found to have a mass at the splenic flexure suspicious for malignancy.  General surgery consulted and she is underwent laparoscopic assisted partial colectomy on 06/12/20. No new complaints this am except for emesis earlier this am.   Assessment & Plan:   Principal Problem:   Lower GI bleed Active Problems:   GERD (gastroesophageal reflux disease)   Acute blood loss anemia   HTN (hypertension)   Acute lower GI bleeding   Gastric polyps   Colonic mass   Diverticulosis of colon without hemorrhage   Adenomatous polyp of sigmoid colon   Malignant neoplasm of descending colon (HCC)   Lower GI bleed/hematochezia with prior history of sigmoid diverticulosis.   GI consulted underwent EGD and colonoscopy was found to have a malignant mass at the splenic flexure of the colon. Biopsy results showed adenocarcinoma. General surgery consulted and she underwent laparoscopic partial colectomy on 06/12/20. She was found to have mid transverse colon mass, with thickened mesentry with numerous enlarged LN, nodular liver.  She was started on clear liquid diet and advanced to full liquids and able to tolerate. Very nauseated last night and earlier this am,     Malignant mass in the colon at the splenic flexure  she underwent laparoscopic partial colectomy on 06/12/20. She was found to have mid transverse colon mass, with thickened mesentry with numerous enlarged LN, nodular liver.  Oncology on board, further recommendations to follow.    Acute blood loss anemia Patient baseline hemoglobin around  13 admitted with a hemoglobin of 11.8, dropped to hemoglobin of 9.5. no new labs today. Repeat CBC in am.  Transfuse to keep hemoglobin greater than 8.    Mild thrombocytopenia:  Improving platelet count.  Continue to monitor.    Hypertension:  Well controlled.    H/o diverticulosis.  No bleeding overnight.     Hypokalemia:  Replaced. Bmp today.    GERD:  Stable on PPI .   DVT prophylaxis: Lovenox  code Status: Full code Family Communication: none at bedside.  Disposition:   Status is: Inpatient  Remains inpatient appropriate because:Ongoing diagnostic testing needed not appropriate for outpatient work up, IV treatments appropriate due to intensity of illness or inability to take PO and Inpatient level of care appropriate due to severity of illness   Dispo: The patient is from: Home              Anticipated d/c is to: pending.               Anticipated d/c date is: 3 days              Patient currently is not medically stable to d/c.       Consultants:   General surgery.    Procedures:  Scheduled for laparoscopic assisted partial colectomy   Antimicrobials: none.    Subjective: Alert, comfortable. Was able to flatus this morning.   Objective: Vitals:   06/13/20 0630 06/13/20 1339 06/13/20 2109 06/14/20 0535  BP: (!) 138/55 (!) 118/53 (!) 128/52 140/62  Pulse: 70 79 91 79  Resp: 14 17 14 14   Temp: 97.9  F (36.6 C) 98.7 F (37.1 C) 99.2 F (37.3 C) 98.2 F (36.8 C)  TempSrc: Oral Oral Oral Oral  SpO2: 93% 96% 95% 94%  Weight:      Height:       No intake or output data in the 24 hours ending 06/14/20 1406 Filed Weights   06/08/20 1131 06/08/20 1935 06/13/20 0500  Weight: 97.5 kg 99.9 kg 101.5 kg    Examination:  General exam: Alert and comfortable.  Respiratory system: air entry fair, no wheezing heard.  Cardiovascular system: S1-S2 heard, RRR no JVD, no pedal edema.  Gastrointestinal system: Abdomen is soft, non tender non  distended bowel sounds good.  Central nervous system: alert and oriented, non focal.  Extremities: No pedal edema.  Skin: No rashes seen.  Psychiatry: Mood is appropriate.     Data Reviewed: I have personally reviewed following labs and imaging studies  CBC: Recent Labs  Lab 06/08/20 1717 06/09/20 0033 06/09/20 0723 06/10/20 0551 06/11/20 0557 06/12/20 0627 06/13/20 0617  WBC 7.0  --   --  7.5 7.7 5.3 9.2  HGB 10.8*   < > 10.1* 11.8* 9.8* 9.6* 9.5*  HCT 33.0*   < > 31.4* 37.4 31.2* 30.4* 29.5*  MCV 89.4  --   --  93.0 94.5 92.1 91.0  PLT 148*  --   --  170 131* 130* 141*   < > = values in this interval not displayed.    Basic Metabolic Panel: Recent Labs  Lab 06/08/20 1211 06/10/20 0551 06/11/20 0557 06/12/20 0627 06/13/20 0617  NA 137 144 142 140 140  K 4.2 3.4* 3.0* 3.6 3.4*  CL 104 115* 114* 112* 112*  CO2 23 19* 20* 19* 23  GLUCOSE 114* 106* 97 92 117*  BUN 14 8 5* <5* <5*  CREATININE 0.61 0.67 0.55 0.54 0.52  CALCIUM 9.0 8.9 8.0* 8.2* 8.0*    GFR: Estimated Creatinine Clearance: 63 mL/min (by C-G formula based on SCr of 0.52 mg/dL).  Liver Function Tests: Recent Labs  Lab 06/08/20 1211 06/12/20 0627  AST 37 21  ALT 25 16  ALKPHOS 71 41  BILITOT 0.4 0.7  PROT 7.2 5.4*  ALBUMIN 3.8 3.0*    CBG: No results for input(s): GLUCAP in the last 168 hours.   Recent Results (from the past 240 hour(s))  Resp Panel by RT-PCR (Flu A&B, Covid)     Status: None   Collection Time: 06/08/20  4:53 PM   Specimen: Nasopharyngeal(NP) swabs in vial transport medium  Result Value Ref Range Status   SARS Coronavirus 2 by RT PCR NEGATIVE NEGATIVE Final    Comment: (NOTE) SARS-CoV-2 target nucleic acids are NOT DETECTED.  The SARS-CoV-2 RNA is generally detectable in upper respiratory specimens during the acute phase of infection. The lowest concentration of SARS-CoV-2 viral copies this assay can detect is 138 copies/mL. A negative result does not preclude  SARS-Cov-2 infection and should not be used as the sole basis for treatment or other patient management decisions. A negative result may occur with  improper specimen collection/handling, submission of specimen other than nasopharyngeal swab, presence of viral mutation(s) within the areas targeted by this assay, and inadequate number of viral copies(<138 copies/mL). A negative result must be combined with clinical observations, patient history, and epidemiological information. The expected result is Negative.  Fact Sheet for Patients:  EntrepreneurPulse.com.au  Fact Sheet for Healthcare Providers:  IncredibleEmployment.be  This test is no t yet approved or cleared by the [place]  FDA and  has been authorized for detection and/or diagnosis of SARS-CoV-2 by FDA under an Emergency Use Authorization (EUA). This EUA will remain  in effect (meaning this test can be used) for the duration of the COVID-19 declaration under Section 564(b)(1) of the Act, 21 U.S.C.section 360bbb-3(b)(1), unless the authorization is terminated  or revoked sooner.       Influenza A by PCR NEGATIVE NEGATIVE Final   Influenza B by PCR NEGATIVE NEGATIVE Final    Comment: (NOTE) The Xpert Xpress SARS-CoV-2/FLU/RSV plus assay is intended as an aid in the diagnosis of influenza from Nasopharyngeal swab specimens and should not be used as a sole basis for treatment. Nasal washings and aspirates are unacceptable for Xpert Xpress SARS-CoV-2/FLU/RSV testing.  Fact Sheet for Patients: EntrepreneurPulse.com.au  Fact Sheet for Healthcare Providers: IncredibleEmployment.be  This test is not yet approved or cleared by the [place] FDA and has been authorized for detection and/or diagnosis of SARS-CoV-2 by FDA under an Emergency Use Authorization (EUA). This EUA will remain in effect (meaning this test can be used) for the duration of  the COVID-19 declaration under Section 564(b)(1) of the Act, 21 U.S.C. section 360bbb-3(b)(1), unless the authorization is terminated or revoked.  Performed at [facility], [address] [place], [place] [address]   Culture, Urine     Status: Abnormal   Collection Time: 06/08/20  8:18 PM   Specimen: Urine, Clean Catch  Result Value Ref Range Status   Specimen Description   Final    URINE, CLEAN CATCH Performed at [facility], [place] [address] [place], [place] [address]    Special Requests   Final    NONE Performed at [facility], [place] [address], [place], [place] [address]    Culture (A)  Final    <10,000 COLONIES/mL INSIGNIFICANT GROWTH Performed at [place] [address] [place],  [address]    Report Status 06/10/2020 FINAL  Final         Radiology Studies: No results found.      Scheduled Meds: . alvimopan  12 mg Oral BID  . celecoxib  200 mg Oral BID  . Chlorhexidine Gluconate Cloth  6 each Topical Daily  . enoxaparin (LOVENOX) injection  40 mg Subcutaneous Q24H  . feeding supplement  237 mL Oral BID BM  . fenofibrate  54 mg Oral Daily  . gabapentin  300 mg Oral BID  . losartan  25 mg Oral Daily  . pantoprazole  40 mg Oral QHS   Continuous Infusions:    LOS: 5 days        Hosie Poisson, MD [facility]   To contact the attending provider between 7A-7P or the covering provider during after hours 7P-7A, please log into the web site [email or web address] and access using universal [place] password for that web site. If you do not have the password, please call the hospital operator.  06/14/2020, 2:06 PM

## 2020-06-15 DIAGNOSIS — K922 Gastrointestinal hemorrhage, unspecified: Secondary | ICD-10-CM | POA: Diagnosis not present

## 2020-06-15 DIAGNOSIS — C186 Malignant neoplasm of descending colon: Secondary | ICD-10-CM | POA: Diagnosis not present

## 2020-06-15 LAB — BASIC METABOLIC PANEL
Anion gap: 7 (ref 5–15)
BUN: 7 mg/dL — ABNORMAL LOW (ref 8–23)
CO2: 24 mmol/L (ref 22–32)
Calcium: 8.6 mg/dL — ABNORMAL LOW (ref 8.9–10.3)
Chloride: 110 mmol/L (ref 98–111)
Creatinine, Ser: 0.49 mg/dL (ref 0.44–1.00)
GFR, Estimated: >60 mL/min (ref >60)
Glucose, Bld: 100 mg/dL — ABNORMAL HIGH (ref 70–99)
Potassium: 3.6 mmol/L (ref 3.5–5.1)
Sodium: 141 mmol/L (ref 135–145)

## 2020-06-15 LAB — CBC
HCT: 27.7 % — ABNORMAL LOW (ref 36.0–46.0)
Hemoglobin: 8.7 g/dL — ABNORMAL LOW (ref 12.0–15.0)
MCH: 29.1 pg (ref 26.0–34.0)
MCHC: 31.4 g/dL (ref 30.0–36.0)
MCV: 92.6 fL (ref 80.0–100.0)
Platelets: 144 10*3/uL — ABNORMAL LOW (ref 150–400)
RBC: 2.99 MIL/uL — ABNORMAL LOW (ref 3.87–5.11)
RDW: 14.3 % (ref 11.5–15.5)
WBC: 6.2 10*3/uL (ref 4.0–10.5)
nRBC: 0 % (ref 0.0–0.2)

## 2020-06-15 NOTE — Progress Notes (Signed)
PROGRESS NOTE    Crystal Lee  RUE:[id number] DOB: [birth year] DOA: 06/08/2020 PCP: Darreld Mclean, MD    Chief Complaint  Patient presents with  . Rectal Bleeding    Brief Narrative: 87 year old lady with prior history of hypertension hyperlipidemia stage I breast cancer, melanoma GERD, osteoporosis, colon polyps presents to ED with rectal bleeding.  GI consulted patient underwent colonoscopy and was found to have a mass at the splenic flexure suspicious for malignancy.  General surgery consulted and she is underwent laparoscopic assisted partial colectomy on 06/12/20. She was started on clear liquids and advanced to full liquid diet. Pt seen and examined, pt reports nausea, little flatus, no bowel movement yet. Pt up and walking around.   Assessment & Plan:   Principal Problem:   Lower GI bleed Active Problems:   GERD (gastroesophageal reflux disease)   Acute blood loss anemia   HTN (hypertension)   Acute lower GI bleeding   Gastric polyps   Colonic mass   Diverticulosis of colon without hemorrhage   Adenomatous polyp of sigmoid colon   Malignant neoplasm of descending colon (HCC)   Lower GI bleed/hematochezia with prior history of sigmoid diverticulosis.   GI consulted underwent EGD and colonoscopy was found to have a malignant mass at the splenic flexure of the colon. Biopsy results showed adenocarcinoma. General surgery consulted and she underwent laparoscopic partial colectomy on 06/12/20. She was found to have mid transverse colon mass, with thickened mesentry with numerous enlarged LN, nodular liver.  She was started on clear liquid diet and advanced to full liquids and able to tolerate. Pt still nauseated occasionally.     Malignant mass in the colon at the splenic flexure  she underwent laparoscopic partial colectomy on 06/12/20. She was found to have mid transverse colon mass, with thickened mesentry with numerous enlarged LN, nodular liver.  Oncology on board,  further recommendations to follow.    Acute blood loss anemia Patient baseline hemoglobin around 13 admitted with a hemoglobin of 11.8, dropped to hemoglobin of 9.5 to 8.7.  Transfuse to keep hemoglobin greater than 8.    Mild thrombocytopenia:  Improving platelet count. No signs of bleeding.  Continue to monitor.    Hypertension:  Well controlled.    H/o diverticulosis.  No bleeding overnight.     Hypokalemia:  Replaced.    GERD:  Stable on PPI .   DVT prophylaxis: Lovenox  code Status: Full code Family Communication: none at bedside.  Disposition:   Status is: Inpatient  Remains inpatient appropriate because:Ongoing diagnostic testing needed not appropriate for outpatient work up, IV treatments appropriate due to intensity of illness or inability to take PO and Inpatient level of care appropriate due to severity of illness   Dispo: The patient is from: Home              Anticipated d/c is to: pending.               Anticipated d/c date is: 3 days              Patient currently is not medically stable to d/c.       Consultants:   General surgery.    Procedures:  Scheduled for laparoscopic assisted partial colectomy   Antimicrobials: none.    Subjective: Nauseated, no vomiting.   Objective: Vitals:   06/14/20 1459 06/14/20 2045 06/15/20 0545 06/15/20 1501  BP: (!) 169/71 (!) 157/69 (!) 128/55 (!) 147/59  Pulse: 85 84 70 73  Resp:  19 16 14  (!) 21  Temp: 98.1 F (36.7 C) (!) 97.4 F (36.3 C) 97.9 F (36.6 C) 98.6 F (37 C)  TempSrc: Oral Oral Oral Oral  SpO2: 97% 98% 96% 97%  Weight:      Height:        Intake/Output Summary (Last 24 hours) at 06/15/2020 1601 Last data filed at 06/15/2020 1225 Gross per 24 hour  Intake 960 ml  Output --  Net 960 ml   Filed Weights   06/08/20 1131 06/08/20 1935 06/13/20 0500  Weight: 97.5 kg 99.9 kg 101.5 kg    Examination:  General exam: alert and comfortable.  Respiratory system: air entry  fair, no wheezing or rhonchi.  Cardiovascular system: S1S2, RRR, no JVD,.  Gastrointestinal system: Abdomen is soft , mild tenderness, non distended,  bowel sounds good Central nervous system: Alert and oriented.  Extremities: no pedal edema.  Skin: No rashes seen.  Psychiatry: mood is appropriate.     Data Reviewed: I have personally reviewed following labs and imaging studies  CBC: Recent Labs  Lab 06/10/20 0551 06/11/20 0557 06/12/20 0627 06/13/20 0617 06/15/20 0555  WBC 7.5 7.7 5.3 9.2 6.2  HGB 11.8* 9.8* 9.6* 9.5* 8.7*  HCT 37.4 31.2* 30.4* 29.5* 27.7*  MCV 93.0 94.5 92.1 91.0 92.6  PLT 170 131* 130* 141* 144*    Basic Metabolic Panel: Recent Labs  Lab 06/11/20 0557 06/12/20 0627 06/13/20 0617 06/14/20 1442 06/15/20 0555  NA 142 140 140 141 141  K 3.0* 3.6 3.4* 3.5 3.6  CL 114* 112* 112* 112* 110  CO2 20* 19* 23 22 24   GLUCOSE 97 92 117* 124* 100*  BUN 5* <5* <5* 5* 7*  CREATININE 0.55 0.54 0.52 0.58 0.49  CALCIUM 8.0* 8.2* 8.0* 8.1* 8.6*    GFR: Estimated Creatinine Clearance: 63 mL/min (by C-G formula based on SCr of 0.49 mg/dL).  Liver Function Tests: Recent Labs  Lab 06/12/20 0627  AST 21  ALT 16  ALKPHOS 41  BILITOT 0.7  PROT 5.4*  ALBUMIN 3.0*    CBG: No results for input(s): GLUCAP in the last 168 hours.   Recent Results (from the past 240 hour(s))  Resp Panel by RT-PCR (Flu A&B, Covid)     Status: None   Collection Time: 06/08/20  4:53 PM   Specimen: Nasopharyngeal(NP) swabs in vial transport medium  Result Value Ref Range Status   SARS Coronavirus 2 by RT PCR NEGATIVE NEGATIVE Final    Comment: (NOTE) SARS-CoV-2 target nucleic acids are NOT DETECTED.  The SARS-CoV-2 RNA is generally detectable in upper respiratory specimens during the acute phase of infection. The lowest concentration of SARS-CoV-2 viral copies this assay can detect is 138 copies/mL. A negative result does not preclude SARS-Cov-2 infection and should not be  used as the sole basis for treatment or other patient management decisions. A negative result may occur with  improper specimen collection/handling, submission of specimen other than nasopharyngeal swab, presence of viral mutation(s) within the areas targeted by this assay, and inadequate number of viral copies(<138 copies/mL). A negative result must be combined with clinical observations, patient history, and epidemiological information. The expected result is Negative.  Fact Sheet for Patients:  EntrepreneurPulse.com.au  Fact Sheet for Healthcare Providers:  IncredibleEmployment.be  This test is no t yet approved or cleared by the [place] FDA and  has been authorized for detection and/or diagnosis of SARS-CoV-2 by FDA under an Emergency Use Authorization (EUA). This EUA will remain  in  effect (meaning this test can be used) for the duration of the COVID-19 declaration under Section 564(b)(1) of the Act, 21 U.S.C.section 360bbb-3(b)(1), unless the authorization is terminated  or revoked sooner.       Influenza A by PCR NEGATIVE NEGATIVE Final   Influenza B by PCR NEGATIVE NEGATIVE Final    Comment: (NOTE) The Xpert Xpress SARS-CoV-2/FLU/RSV plus assay is intended as an aid in the diagnosis of influenza from Nasopharyngeal swab specimens and should not be used as a sole basis for treatment. Nasal washings and aspirates are unacceptable for Xpert Xpress SARS-CoV-2/FLU/RSV testing.  Fact Sheet for Patients: EntrepreneurPulse.com.au  Fact Sheet for Healthcare Providers: IncredibleEmployment.be  This test is not yet approved or cleared by the [place] FDA and has been authorized for detection and/or diagnosis of SARS-CoV-2 by FDA under an Emergency Use Authorization (EUA). This EUA will remain in effect (meaning this test can be used) for the duration of the COVID-19 declaration under Section  564(b)(1) of the Act, 21 U.S.C. section 360bbb-3(b)(1), unless the authorization is terminated or revoked.  Performed at [facility], [address] [place], [place] [address]   Culture, Urine     Status: Abnormal   Collection Time: 06/08/20  8:18 PM   Specimen: Urine, Clean Catch  Result Value Ref Range Status   Specimen Description   Final    URINE, CLEAN CATCH Performed at [facility], [place] [address], [place], [place] [address]    Special Requests   Final    NONE Performed at [facility], [place] [address], [place], [place] [address]    Culture (A)  Final    <10,000 COLONIES/mL INSIGNIFICANT GROWTH Performed at [place] [address] [place], [place] [address]    Report Status 06/10/2020 FINAL  Final         Radiology Studies: No results found.      Scheduled Meds: . alvimopan  12 mg Oral BID  . celecoxib  200 mg Oral BID  . Chlorhexidine Gluconate Cloth  6 each Topical Daily  . enoxaparin (LOVENOX) injection  40 mg Subcutaneous Q24H  . feeding supplement  237 mL Oral BID BM  . fenofibrate  54 mg Oral Daily  . gabapentin  300 mg Oral BID  . losartan  25 mg Oral Daily  . pantoprazole  40 mg Oral QHS   Continuous Infusions:    LOS: 6 days        Hosie Poisson, MD [facility]   To contact the attending provider between 7A-7P or the covering provider during after hours 7P-7A, please log into the web site [email or web address] and access using universal [place] password for that web site. If you do not have the password, please call the hospital operator.  06/15/2020, 4:01 PM

## 2020-06-15 NOTE — Progress Notes (Signed)
3 Days Post-Op   Subjective/Chief Complaint: Passed flatus but no bm Still with slight nausea at night  On fulls   Objective: Vital signs in last 24 hours: Temp:  [97.4 F (36.3 C)-98.1 F (36.7 C)] 97.9 F (36.6 C) (01/16 0545) Pulse Rate:  [70-85] 70 (01/16 0545) Resp:  [14-19] 14 (01/16 0545) BP: (128-169)/(55-71) 128/55 (01/16 0545) SpO2:  [96 %-98 %] 96 % (01/16 0545) Last BM Date: 06/10/20  Intake/Output from previous day: 01/15 0701 - 01/16 0700 In: 480 [P.O.:480] Out: -  Intake/Output this shift: No intake/output data recorded.  Exam: Awake and alert Abdomen soft, almost non-tender, incisions clean  Lab Results:  Recent Labs    06/13/20 0617 06/15/20 0555  WBC 9.2 6.2  HGB 9.5* 8.7*  HCT 29.5* 27.7*  PLT 141* 144*   BMET Recent Labs    06/14/20 1442 06/15/20 0555  NA 141 141  K 3.5 3.6  CL 112* 110  CO2 22 24  GLUCOSE 124* 100*  BUN 5* 7*  CREATININE 0.58 0.49  CALCIUM 8.1* 8.6*   PT/INR No results for input(s): LABPROT, INR in the last 72 hours. ABG No results for input(s): PHART, HCO3 in the last 72 hours.  Invalid input(s): PCO2, PO2  Studies/Results: No results found.  Anti-infectives: Anti-infectives (From admission, onward)   Start     Dose/Rate Route Frequency Ordered Stop   06/12/20 0600  cefoTEtan (CEFOTAN) 2 g in sodium chloride 0.9 % 100 mL IVPB        2 g 200 mL/hr over 30 Minutes Intravenous On call to O.R. 06/11/20 1417 06/12/20 1002      Assessment/Plan: s/p Procedure(s): LAPAROSCOPIC COLON RESECTION (N/A)  Keep on fulls today Ambulate Decrease IVF Repeat Hgb in the morning.  Drifting a little bit Awaiting final path   LOS: 6 days    [name] 06/15/2020

## 2020-06-16 ENCOUNTER — Inpatient Hospital Stay: Payer: Medicare Other

## 2020-06-16 ENCOUNTER — Inpatient Hospital Stay: Payer: Medicare Other | Admitting: Hematology & Oncology

## 2020-06-16 DIAGNOSIS — K922 Gastrointestinal hemorrhage, unspecified: Secondary | ICD-10-CM | POA: Diagnosis not present

## 2020-06-16 DIAGNOSIS — C186 Malignant neoplasm of descending colon: Secondary | ICD-10-CM | POA: Diagnosis not present

## 2020-06-16 LAB — CBC
HCT: 30 % — ABNORMAL LOW (ref 36.0–46.0)
Hemoglobin: 9.3 g/dL — ABNORMAL LOW (ref 12.0–15.0)
MCH: 29.3 pg (ref 26.0–34.0)
MCHC: 31 g/dL (ref 30.0–36.0)
MCV: 94.6 fL (ref 80.0–100.0)
Platelets: 154 10*3/uL (ref 150–400)
RBC: 3.17 MIL/uL — ABNORMAL LOW (ref 3.87–5.11)
RDW: 14.5 % (ref 11.5–15.5)
WBC: 5.3 10*3/uL (ref 4.0–10.5)
nRBC: 0 % (ref 0.0–0.2)

## 2020-06-16 MED ORDER — CALCIUM CARBONATE ANTACID 500 MG PO CHEW
1.0000 | CHEWABLE_TABLET | Freq: Three times a day (TID) | ORAL | Status: DC
  Administered 2020-06-16 – 2020-06-18 (×4): 200 mg via ORAL
  Filled 2020-06-16 (×4): qty 1

## 2020-06-16 NOTE — Evaluation (Signed)
Occupational Therapy Evaluation Patient Details Name: Crystal Lee MRN: [id number] DOB: 1933/11/04 Today's Date: 06/16/2020    History of Present Illness 87 yo female presenting to ED with rectal bleeding and lower abdominal pain. angio abdomen and pelvis to see if there is any source of bleeding that could be amenable to IR. S/p colonoscopy and upper GI endoscopy on 06/10/20. PMH including cancer, arthritis, L hipfx (2016), HTN, neuropathy, R TKA, R THA, and L THA.   Clinical Impression   PTA, pt was living alone and was independent for ADLs, IADLs, and driving; uses RW or rollator as needed. Reports her daughter checks in regularly. Currently, pt performing ADLs and functional mobility at Mod I level and using RW. Provided education on compensatory techniques for LB ADLs, bed mobility, toileting, and shower transfer; pt demonstrated and verbalized understanding. Assisting pt in ordering meals for today as diet order changed this morning. Answered all pt questions. Recommend dc home once medically stable per physician. All acute OT needs met and will sign off. Thank you.    Follow Up Recommendations  No OT follow up;Supervision - Intermittent;Other (comment) [facility] RN)    Equipment Recommendations  None recommended by OT;Other (comment) (Declined shower chair)    Recommendations for Other Services       Precautions / Restrictions Precautions Precautions: None      Mobility Bed Mobility Overal bed mobility: Modified Independent                  Transfers Overall transfer level: Modified independent Equipment used: Rolling walker (2 wheeled)             General transfer comment: Used of RW. no physical A    Balance Overall balance assessment: Needs assistance Sitting-balance support: No upper extremity supported;Feet supported Sitting balance-Leahy Scale: Good     Standing balance support: No upper extremity supported;During functional activity Standing  balance-Leahy Scale: Good                             ADL either performed or assessed with clinical judgement   ADL Overall ADL's : Modified independent                                       General ADL Comments: Pt performing ADLs and functional mobility at Mod I level with increased time and use of DME. Pt reporting discomfort and pain at abdomen with forward flexsion. Educating pt on compensatory techniques such as using reacher for donning pants. Pt verbalizing understanding and describing how she plans to perform ADLs and mobility. Reports her daughter can check in some but not often.     Vision Baseline Vision/History: Wears glasses Wears Glasses: Reading only Patient Visual Report: No change from baseline       Perception     Praxis      Pertinent Vitals/Pain Pain Assessment: Faces Faces Pain Scale: Hurts even more Pain Location: Abdomen Pain Descriptors / Indicators: Constant;Discomfort;Grimacing Pain Intervention(s): Monitored during session;Limited activity within patient's tolerance;Repositioned     Hand Dominance Right   Extremity/Trunk Assessment Upper Extremity Assessment Upper Extremity Assessment: Overall WFL for tasks assessed   Lower Extremity Assessment Lower Extremity Assessment: Overall WFL for tasks assessed;LLE deficits/detail LLE Deficits / Details: L knee pain at baseline   Cervical / Trunk Assessment Cervical / Trunk Assessment: Other exceptions;Kyphotic Cervical / Trunk  Exceptions: forward flexion at shoulder   Communication Communication Communication: No difficulties   Cognition Arousal/Alertness: Awake/alert Behavior During Therapy: WFL for tasks assessed/performed Overall Cognitive Status: Within Functional Limits for tasks assessed                                     General Comments  Pt has been walking in hallway since day after sx    Exercises     Shoulder Instructions      Home  Living Family/patient expects to be discharged to:: Private residence Living Arrangements: Alone Available Help at Discharge: Family;Available PRN/intermittently Type of Home: House Home Access: Level entry     Home Layout: One level     Bathroom Shower/Tub: Occupational psychologist: Handicapped height (BSC over)     Home Equipment: Walker - 2 wheels;Walker - 4 wheels;Bedside commode;Grab bars - tub/shower;Hand held shower head;Adaptive equipment Adaptive Equipment: Reacher        Prior Functioning/Environment Level of Independence: Independent with assistive device(s)        Comments: Uses a RW/rollator as needed. Performs ADLs, IADLs, and drives. Daughter will checkin and assist with household chores or grocery shopping        OT Problem List: Decreased range of motion;Decreased knowledge of use of DME or AE;Decreased knowledge of precautions;Pain      OT Treatment/Interventions:      OT Goals(Current goals can be found in the care plan section) Acute Rehab OT Goals Patient Stated Goal: Go home OT Goal Formulation: All assessment and education complete, DC therapy  OT Frequency:     Barriers to D/C:            Co-evaluation              AM-PAC OT "6 Clicks" Daily Activity     Outcome Measure Help from another person eating meals?: None Help from another person taking care of personal grooming?: None Help from another person toileting, which includes using toliet, bedpan, or urinal?: None Help from another person bathing (including washing, rinsing, drying)?: None Help from another person to put on and taking off regular upper body clothing?: None Help from another person to put on and taking off regular lower body clothing?: None 6 Click Score: 24   End of Session Equipment Utilized During Treatment: Rolling walker Nurse Communication: Mobility status  Activity Tolerance: Patient tolerated treatment well Patient left: in bed;with call  bell/phone within reach (EOB)  OT Visit Diagnosis: Unsteadiness on feet (R26.81);Pain Pain - part of body:  (Stomach)                Time: 8144-8185 OT Time Calculation (min): 28 min Charges:  OT General Charges $OT Visit: 1 Visit OT Evaluation $OT Eval Moderate Complexity: 1 Mod OT Treatments $Self Care/Home Management : 8-22 mins  Kirstyn Lean MSOT, OTR/L Acute Rehab Pager: [phone] Office: [place] 06/16/2020, 10:08 AM

## 2020-06-16 NOTE — Progress Notes (Signed)
PT Note  Patient Details Name: Crystal Lee MRN: [id number] DOB: 08-06-1933        chart reviewed and spoke with pt at bedside. Pt was up brushing teeth and the sink, doing well in the room. Pt stated she has already walked in hallway many times during the day with RW which she also has at home. No steps to enter, tolerating mobility and bed mobility well. All other items covered by OT this morning. No Pt needs at this time, to sign off.    Clide Dales 06/16/2020, 11:20 AM  Gatha Mayer, PT, MPT Acute Rehabilitation Services Office: [phone] Pager: [phone] 06/16/2020

## 2020-06-16 NOTE — Progress Notes (Signed)
PROGRESS NOTE    Crystal Lee  [id number] DOB: 12-31-1933 DOA: 06/08/2020 PCP: Darreld Mclean, MD    Chief Complaint  Patient presents with   Rectal Bleeding    Brief Narrative: 87 year old lady with prior history of hypertension hyperlipidemia stage I breast cancer, melanoma GERD, osteoporosis, colon polyps presents to ED with rectal bleeding.  GI consulted patient underwent colonoscopy and was found to have a mass at the splenic flexure suspicious for malignancy.  General surgery consulted and she is underwent laparoscopic assisted partial colectomy on 06/12/20. She was started on clear liquids and advanced to soft diet. Pt had BM yesterday.    Assessment & Plan:   Principal Problem:   Lower GI bleed Active Problems:   GERD (gastroesophageal reflux disease)   Acute blood loss anemia   HTN (hypertension)   Acute lower GI bleeding   Gastric polyps   Colonic mass   Diverticulosis of colon without hemorrhage   Adenomatous polyp of sigmoid colon   Malignant neoplasm of descending colon (HCC)   Lower GI bleed/hematochezia with prior history of sigmoid diverticulosis.   GI consulted underwent EGD and colonoscopy was found to have a malignant mass at the splenic flexure of the colon. Biopsy results showed adenocarcinoma. General surgery consulted and she underwent laparoscopic partial colectomy on 06/12/20. She was found to have mid transverse colon mass, with thickened mesentry with numerous enlarged LN, nodular liver.  She was started on clear liquid diet and advanced to  Soft diet. She had BM today.  Possibly discharge on Wednesday.     Malignant mass in the colon at the splenic flexure  she underwent laparoscopic partial colectomy on 06/12/20. She was found to have mid transverse colon mass, with thickened mesentry with numerous enlarged LN, nodular liver.  Oncology on board, further recommendations to follow.    Acute blood loss anemia Patient baseline hemoglobin  around 13 admitted with a hemoglobin of 11.8, dropped to hemoglobin of 9.5 to 9.3 Transfuse to keep hemoglobin greater than 8.    Mild thrombocytopenia:  Improving platelet count. No signs of bleeding.  Continue to monitor.    Hypertension:  Well controlled.    H/o diverticulosis.  No bleeding overnight.     Hypokalemia:  Replaced.    GERD:  Stable on PPI .   DVT prophylaxis: Lovenox  code Status: Full code Family Communication: none at bedside.  Disposition:   Status is: Inpatient  Remains inpatient appropriate because:Ongoing diagnostic testing needed not appropriate for outpatient work up, IV treatments appropriate due to intensity of illness or inability to take PO and Inpatient level of care appropriate due to severity of illness   Dispo: The patient is from: Home              Anticipated d/c is to: Home              Anticipated d/c date is: 2 days              Patient currently is not medically stable to d/c.       Consultants:   General surgery.    Procedures:  Scheduled for laparoscopic assisted partial colectomy   Antimicrobials: none.    Subjective: Alert and comfortable. Reflux symptoms.   Objective: Vitals:   06/15/20 1501 06/15/20 2101 06/16/20 0603 06/16/20 1411  BP: (!) 147/59 (!) 157/60 (!) 162/81 (!) 156/61  Pulse: 73 80 73 84  Resp: (!) 21 20 19 17   Temp: 98.6 F (37 C) 98.5  F (36.9 C) 98.6 F (37 C) 98.4 F (36.9 C)  TempSrc: Oral Oral  Oral  SpO2: 97% 98% 94% 97%  Weight:      Height:        Intake/Output Summary (Last 24 hours) at 06/16/2020 1807 Last data filed at 06/16/2020 1300 Gross per 24 hour  Intake 910 ml  Output --  Net 910 ml   Filed Weights   06/08/20 1131 06/08/20 1935 06/13/20 0500  Weight: 97.5 kg 99.9 kg 101.5 kg    Examination:  General exam: Alert and comfortable.  Respiratory system: Air entry fair, no wheezing heard.  Cardiovascular system: S1S2, RRR, no JVD,.  Gastrointestinal system:  Abdomen is soft, non tender non distended bowel sounds wnl.  Central nervous system: alert and oriented.  Extremities: no pedal edema.  Skin: no rashes seen.  Psychiatry: mood is appropriate.     Data Reviewed: I have personally reviewed following labs and imaging studies  CBC: Recent Labs  Lab 06/11/20 0557 06/12/20 0627 06/13/20 0617 06/15/20 0555 06/16/20 0558  WBC 7.7 5.3 9.2 6.2 5.3  HGB 9.8* 9.6* 9.5* 8.7* 9.3*  HCT 31.2* 30.4* 29.5* 27.7* 30.0*  MCV 94.5 92.1 91.0 92.6 94.6  PLT 131* 130* 141* 144* 607    Basic Metabolic Panel: Recent Labs  Lab 06/11/20 0557 06/12/20 0627 06/13/20 0617 06/14/20 1442 06/15/20 0555  NA 142 140 140 141 141  K 3.0* 3.6 3.4* 3.5 3.6  CL 114* 112* 112* 112* 110  CO2 20* 19* 23 22 24   GLUCOSE 97 92 117* 124* 100*  BUN 5* <5* <5* 5* 7*  CREATININE 0.55 0.54 0.52 0.58 0.49  CALCIUM 8.0* 8.2* 8.0* 8.1* 8.6*    GFR: Estimated Creatinine Clearance: 63 mL/min (by C-G formula based on SCr of 0.49 mg/dL).  Liver Function Tests: Recent Labs  Lab 06/12/20 0627  AST 21  ALT 16  ALKPHOS 41  BILITOT 0.7  PROT 5.4*  ALBUMIN 3.0*    CBG: No results for input(s): GLUCAP in the last 168 hours.   Recent Results (from the past 240 hour(s))  Resp Panel by RT-PCR (Flu A&B, Covid)     Status: None   Collection Time: 06/08/20  4:53 PM   Specimen: Nasopharyngeal(NP) swabs in vial transport medium  Result Value Ref Range Status   SARS Coronavirus 2 by RT PCR NEGATIVE NEGATIVE Final    Comment: (NOTE) SARS-CoV-2 target nucleic acids are NOT DETECTED.  The SARS-CoV-2 RNA is generally detectable in upper respiratory specimens during the acute phase of infection. The lowest concentration of SARS-CoV-2 viral copies this assay can detect is 138 copies/mL. A negative result does not preclude SARS-Cov-2 infection and should not be used as the sole basis for treatment or other patient management decisions. A negative result may occur with   improper specimen collection/handling, submission of specimen other than nasopharyngeal swab, presence of viral mutation(s) within the areas targeted by this assay, and inadequate number of viral copies(<138 copies/mL). A negative result must be combined with clinical observations, patient history, and epidemiological information. The expected result is Negative.  Fact Sheet for Patients:  EntrepreneurPulse.com.au  Fact Sheet for Healthcare Providers:  IncredibleEmployment.be  This test is no t yet approved or cleared by the [place] FDA and  has been authorized for detection and/or diagnosis of SARS-CoV-2 by FDA under an Emergency Use Authorization (EUA). This EUA will remain  in effect (meaning this test can be used) for the duration of the COVID-19 declaration under  Section 564(b)(1) of the Act, 21 U.S.C.section 360bbb-3(b)(1), unless the authorization is terminated  or revoked sooner.       Influenza A by PCR NEGATIVE NEGATIVE Final   Influenza B by PCR NEGATIVE NEGATIVE Final    Comment: (NOTE) The Xpert Xpress SARS-CoV-2/FLU/RSV plus assay is intended as an aid in the diagnosis of influenza from Nasopharyngeal swab specimens and should not be used as a sole basis for treatment. Nasal washings and aspirates are unacceptable for Xpert Xpress SARS-CoV-2/FLU/RSV testing.  Fact Sheet for Patients: EntrepreneurPulse.com.au  Fact Sheet for Healthcare Providers: IncredibleEmployment.be  This test is not yet approved or cleared by the [place] FDA and has been authorized for detection and/or diagnosis of SARS-CoV-2 by FDA under an Emergency Use Authorization (EUA). This EUA will remain in effect (meaning this test can be used) for the duration of the COVID-19 declaration under Section 564(b)(1) of the Act, 21 U.S.C. section 360bbb-3(b)(1), unless the authorization is terminated  or revoked.  Performed at [facility], [address] [place], [place] [address]   Culture, Urine     Status: Abnormal   Collection Time: 06/08/20  8:18 PM   Specimen: Urine, Clean Catch  Result Value Ref Range Status   Specimen Description   Final    URINE, CLEAN CATCH Performed at [facility], [place] [address], [place], [place] [address]    Special Requests   Final    NONE Performed at [facility], [place] [address] [place], [place] [address]    Culture (A)  Final    <10,000 COLONIES/mL INSIGNIFICANT GROWTH Performed at [place] [address] [place], [place] [address]    Report Status 06/10/2020 FINAL  Final         Radiology Studies: No results found.      Scheduled Meds:  calcium carbonate  1 tablet Oral TID WC   celecoxib  200 mg Oral BID   Chlorhexidine Gluconate Cloth  6 each Topical Daily   enoxaparin (LOVENOX) injection  40 mg Subcutaneous Q24H   feeding supplement  237 mL Oral BID BM   fenofibrate  54 mg Oral Daily   gabapentin  300 mg Oral BID   losartan  25 mg Oral Daily   pantoprazole  40 mg Oral QHS   Continuous Infusions:    LOS: 7 days        Hosie Poisson, MD [facility]   To contact the attending provider between 7A-7P or the covering provider during after hours 7P-7A, please log into the web site [email or web address] and access using universal [place] password for that web site. If you do not have the password, please call the hospital operator.  06/16/2020, 6:07 PM

## 2020-06-16 NOTE — Progress Notes (Signed)
Pharmacy Brief Note - Alvimopan (Entereg)  The standing order set for alvimopan (Entereg) now includes an automatic order to discontinue the drug after the patient has had a bowel movement. The change was approved by the [place] and the Medical Executive Committee.   This patient has had bowel movements documented by nursing. Therefore, alvimopan has been discontinued. If there are questions, please contact the pharmacy at [phone].   Thank you-  Dia Sitter, PharmD, BCPS 06/16/2020 7:22 AM

## 2020-06-16 NOTE — Progress Notes (Signed)
4 Days Post-Op    CC: GI bleed with descending colon mass  Subjective: Patient looks pretty good this AM.  She says her stool was soft and informed stool.  She says she is getting around the room.  She has not had any OT or PT evaluation.  She wants to go home by her self.  Her daughter lives in [place].  She is tolerating a soft diet her incisions look fine.  Objective: Vital signs in last 24 hours: Temp:  [98.5 F (36.9 C)-98.6 F (37 C)] 98.6 F (37 C) (01/17 0603) Pulse Rate:  [73-80] 73 (01/17 0603) Resp:  [19-21] 19 (01/17 0603) BP: (147-162)/(59-81) 162/81 (01/17 0603) SpO2:  [94 %-98 %] 94 % (01/17 0603) Last BM Date: 06/15/20 720 p.o. No IV intake recorded. Voided x4 BM x1 Afebrile vital signs are stable. K+ 3.6 yesterday. H/H 9.3/30 06/16/2020   Intake/Output from previous day: 01/16 0701 - 01/17 0700 In: 720 [P.O.:720] Out: -  Intake/Output this shift: No intake/output data recorded.  General appearance: alert, cooperative and no distress Resp: clear to auscultation bilaterally and She is moving 1200 on incentive spirometry. GI: Soft, sore, incision/port sites look fine.  Tolerating full liquids, positive BM.  Lab Results:  Recent Labs    06/15/20 0555 06/16/20 0558  WBC 6.2 5.3  HGB 8.7* 9.3*  HCT 27.7* 30.0*  PLT 144* 154    BMET Recent Labs    06/14/20 1442 06/15/20 0555  NA 141 141  K 3.5 3.6  CL 112* 110  CO2 22 24  GLUCOSE 124* 100*  BUN 5* 7*  CREATININE 0.58 0.49  CALCIUM 8.1* 8.6*   PT/INR No results for input(s): LABPROT, INR in the last 72 hours.  Recent Labs  Lab 06/12/20 0627  AST 21  ALT 16  ALKPHOS 41  BILITOT 0.7  PROT 5.4*  ALBUMIN 3.0*     Lipase     Component Value Date/Time   LIPASE 151 04/30/2010 1126     Prior to Admission medications   Medication Sig Start Date End Date Taking? Authorizing Provider  cyclobenzaprine (FLEXERIL) 5 MG tablet TAKE ONE TABLET BY MOUTH AT BEDTIME AS NEEDED FOR MUSCLE  SPASMS Patient taking differently: Take 5 mg by mouth at bedtime. 09/18/19  Yes Bayard Hugger, NP  fenofibrate (TRICOR) 48 MG tablet Take 1 tablet (48 mg total) by mouth daily. 01/28/20  Yes Copland, Gay Filler, MD  HYDROcodone-acetaminophen (NORCO) 10-325 MG tablet Take 1 tablet by mouth every 6 (six) hours as needed. Patient taking differently: Take 1 tablet by mouth every 6 (six) hours as needed for moderate pain. 05/20/20  Yes Bayard Hugger, NP  losartan (COZAAR) 25 MG tablet Take 1 tablet (25 mg total) by mouth daily. 07/11/19  Yes Copland, Gay Filler, MD  Multiple Vitamins-Minerals (MULTIVITAMIN ADULTS) TABS Take 1 tablet by mouth daily.   Yes [provider]  ergocalciferol (VITAMIN D2) 1.25 MG (50000 UT) capsule Take 1 capsule (50,000 Units total) by mouth once a week. Patient not taking: No sig reported 12/05/19   [name], MD  furosemide (LASIX) 20 MG tablet Take 1 tablet (20 mg total) by mouth daily. 07/13/19 02/25/20  Richardo Priest, MD  metroNIDAZOLE (METROGEL) 1 % gel Apply topically daily. Use as needed for rosacea Patient not taking: No sig reported 07/11/19   Copland, Gay Filler, MD  moxifloxacin (VIGAMOX) 0.5 % ophthalmic solution  06/07/20   [provider]  potassium chloride (KLOR-CON) 10 MEQ  tablet Take 1 tablet (10 mEq total) by mouth daily. 12/11/19 03/10/20  Richardo Priest, MD    Medications: . celecoxib  200 mg Oral BID  . Chlorhexidine Gluconate Cloth  6 each Topical Daily  . enoxaparin (LOVENOX) injection  40 mg Subcutaneous Q24H  . feeding supplement  237 mL Oral BID BM  . fenofibrate  54 mg Oral Daily  . gabapentin  300 mg Oral BID  . losartan  25 mg Oral Daily  . pantoprazole  40 mg Oral QHS     Assessment/Plan Hx GI bleed Hx diverticulosis GERD Anemia Hypertension Hx stage I breast cancer, Hx melanoma Hyperlipidemia Osteoporosis Thrombocytopenia  - Platelets 131>> 144>> 154K  Mid transverse colon mass COLON, DESCENDING,  BIOPSY: - Adenocarcinoma  Laparoscopic assisted transverse colectomy, 06/12/2020, DR. [name] POD #4  FEN: Full liquids ID: Pain: Celebrex 200 mg x 2, Neurontin 300 mg x 2, hydrocodone 5/325 2 tablets x 2 DVT: Lovenox Follow-up: Dr. Ninfa Linden    Plan: Advance her to a soft diet.  TOC evaluation, OT and PT.  If she tolerates a soft diet continues to have bowel movements and is mobilizing safely she should be able to go home in the next 24 to 48 hours.    LOS: 7 days    Crystal Lee 06/16/2020 Please see Amion

## 2020-06-17 DIAGNOSIS — C186 Malignant neoplasm of descending colon: Secondary | ICD-10-CM | POA: Diagnosis not present

## 2020-06-17 DIAGNOSIS — K922 Gastrointestinal hemorrhage, unspecified: Secondary | ICD-10-CM | POA: Diagnosis not present

## 2020-06-17 DIAGNOSIS — D62 Acute posthemorrhagic anemia: Secondary | ICD-10-CM | POA: Diagnosis not present

## 2020-06-17 LAB — URINALYSIS, ROUTINE W REFLEX MICROSCOPIC
Bilirubin Urine: NEGATIVE
Glucose, UA: NEGATIVE mg/dL
Hgb urine dipstick: NEGATIVE
Ketones, ur: NEGATIVE mg/dL
Nitrite: NEGATIVE
Protein, ur: NEGATIVE mg/dL
Specific Gravity, Urine: 1.011 (ref 1.005–1.030)
pH: 7 (ref 5.0–8.0)

## 2020-06-17 MED ORDER — GABAPENTIN 300 MG PO CAPS: 300.0000 mg | ORAL_CAPSULE | Freq: Two times a day (BID) | ORAL | 1 refills | Status: DC

## 2020-06-17 MED ORDER — ENSURE SURGERY PO LIQD: 237.0000 mL | Freq: Two times a day (BID) | ORAL | 0 refills | Status: DC

## 2020-06-17 MED ORDER — PANTOPRAZOLE SODIUM 40 MG PO TBEC
40.0000 mg | DELAYED_RELEASE_TABLET | Freq: Two times a day (BID) | ORAL | Status: DC
  Administered 2020-06-17 – 2020-06-18 (×3): 40 mg via ORAL
  Filled 2020-06-17 (×3): qty 1

## 2020-06-17 MED ORDER — PANTOPRAZOLE SODIUM 40 MG PO TBEC: 40.0000 mg | DELAYED_RELEASE_TABLET | Freq: Two times a day (BID) | ORAL | 0 refills | Status: DC

## 2020-06-17 MED ORDER — PANTOPRAZOLE SODIUM 40 MG PO TBEC: 40.0000 mg | DELAYED_RELEASE_TABLET | Freq: Every day | ORAL | Status: DC

## 2020-06-17 NOTE — Care Management Important Message (Signed)
Important Message  Patient Details IM Letter given to the Patient. Name: Crystal Lee MRN: [id number] Date of Birth: 1933-07-26   Medicare Important Message Given:  Yes     Kerin Salen 06/17/2020, 10:22 AM

## 2020-06-17 NOTE — Progress Notes (Addendum)
5 Days Post-Op  Subjective: CC: Doing well. Tolerating soft diet without increased abdominal pain or emesis. No nausea this am. Passing flatus. Loose BM yesterday. Some soreness around incisions that are well controlled. Mobilizing. Cleared by PT/OT yesterday for no f/u. OT recommending intermittent supervision. Her daughter lives in [place]. We discussed her path from her surgeries.   Objective: Vital signs in last 24 hours: Temp:  [98.3 F (36.8 C)-98.6 F (37 C)] 98.3 F (36.8 C) (01/18 0527) Pulse Rate:  [75-84] 75 (01/18 0527) Resp:  [14-17] 14 (01/18 0527) BP: (150-157)/(61-80) 150/63 (01/18 0527) SpO2:  [94 %-97 %] 94 % (01/18 0527) Last BM Date: 06/16/20  Intake/Output from previous day: 01/17 0701 - 01/18 0700 In: 1145 [P.O.:1145] Out: -  Intake/Output this shift: No intake/output data recorded.  PE: Gen:  Alert, NAD, pleasant Card:  RRR Pulm:  CTAB, rate and effort normal Abd: Soft, ND, appropriately tender around incisions, +BS. Incisions with glue intact appears well and are without drainage, bleeding, or signs of infection Ext:  No LE pitting edema  Psych: A&Ox3  Skin: no rashes noted, warm and dry   Lab Results:  Recent Labs    06/15/20 0555 06/16/20 0558  WBC 6.2 5.3  HGB 8.7* 9.3*  HCT 27.7* 30.0*  PLT 144* 154   BMET Recent Labs    06/14/20 1442 06/15/20 0555  NA 141 141  K 3.5 3.6  CL 112* 110  CO2 22 24  GLUCOSE 124* 100*  BUN 5* 7*  CREATININE 0.58 0.49  CALCIUM 8.1* 8.6*   PT/INR No results for input(s): LABPROT, INR in the last 72 hours. CMP     Component Value Date/Time   NA 141 06/15/2020 0555   NA 139 02/25/2020 1627   K 3.6 06/15/2020 0555   CL 110 06/15/2020 0555   CO2 24 06/15/2020 0555   GLUCOSE 100 (H) 06/15/2020 0555   BUN 7 (L) 06/15/2020 0555   BUN 11 02/25/2020 1627   CREATININE 0.49 06/15/2020 0555   CREATININE 0.66 04/07/2020 1331   CALCIUM 8.6 (L) 06/15/2020 0555   PROT 5.4 (L) 06/12/2020 0627    PROT 6.8 02/09/2017 1039   ALBUMIN 3.0 (L) 06/12/2020 0627   ALBUMIN 4.1 02/09/2017 1039   AST 21 06/12/2020 0627   AST 59 (H) 04/07/2020 1331   ALT 16 06/12/2020 0627   ALT 38 04/07/2020 1331   ALKPHOS 41 06/12/2020 0627   BILITOT 0.7 06/12/2020 0627   BILITOT 0.6 04/07/2020 1331   GFRNONAA >60 06/15/2020 0555   GFRNONAA >60 04/07/2020 1331   GFRAA 93 02/25/2020 1627   GFRAA >60 12/05/2019 1251   Lipase     Component Value Date/Time   LIPASE 151 04/30/2010 1126       Studies/Results: No results found.  Anti-infectives: Anti-infectives (From admission, onward)   Start     Dose/Rate Route Frequency Ordered Stop   06/12/20 0600  cefoTEtan (CEFOTAN) 2 g in sodium chloride 0.9 % 100 mL IVPB        2 g 200 mL/hr over 30 Minutes Intravenous On call to O.R. 06/11/20 1417 06/12/20 1002       Assessment/Plan Hx GI bleed Hx diverticulosis GERD Anemia Hypertension Hx stage I breast cancer, Hx melanoma Hyperlipidemia Osteoporosis  Mid transverse colon mass - Adenocarcinoma  S/p Laparoscopic assisted transverse colectomy, 06/12/2020, DR. [name]  - POD #5 - Path as noted below (invasive adenocarcinoma, pT4b, pN1b). Discussed with patient  - Dr. Marin Olp has  already seen inpatient. Will need follow up with his office - I have sent for follow up with our office - Cleared by PT/OT yesterday for no f/u. OT recommending intermittent supervision. Her daughter lives in [place]. - The patient is voiding well, tolerating diet, ambulating well, pain well controlled, vital signs stable, incisions c/d/i, having bowel function and felt stable for discharge home. Will reach out to [facility].  FEN: Soft diet  ID: Cefotetan periop DVT: SCDs, Lovenox Follow-up: Dr. Ninfa Linden, Dr. Marin Olp    Path  A. COLON, TRANSVERSE, RESECTION:  - Invasive adenocarcinoma, 4.5 cm.  - Carcinoma invades pericolonic connective tissue and into attached  omentum.  - Margins not involved.  -  Metastatic carcinoma in three of fourteen lymph nodes (3/14).  - See oncology table.    LOS: 8 days    Jillyn Ledger , [facility] Surgery 06/17/2020, 8:48 AM Please see Amion for pager number during day hours 7:00am-4:30pm

## 2020-06-17 NOTE — Discharge Summary (Signed)
Physician Discharge Summary  Estefanie Brenizer [id number] DOB: [birth year] DOA: 06/08/2020  PCP: Darreld Mclean, MD  Admit date: 06/08/2020 Discharge date: 06/18/2020  Admitted From: Home.  Disposition: Home.   Recommendations for Outpatient Follow-up:  1. Follow up with PCP in 1-2 weeks 2. Please obtain BMP/CBC in one week Please follow up with oncology as recommended.   Discharge Condition: stable.  CODE STATUS:full code.  Diet recommendation: Heart Healthy    Brief/Interim Summary: 88 year old lady with prior history of hypertension hyperlipidemia stage I breast cancer, melanoma GERD, osteoporosis, colon polyps presents to ED with rectal bleeding.  GI consulted patient underwent colonoscopy and was found to have a mass at the splenic flexure suspicious for malignancy.  General surgery consulted and she is underwent laparoscopic assisted partial colectomy on 06/12/20. She was started on clear liquids and advanced to soft diet. Pathology showed invasive adenocarcinoma with involvement of lymph nodes 3/14.   Discharge Diagnoses:  Principal Problem:   Lower GI bleed Active Problems:   GERD (gastroesophageal reflux disease)   Acute blood loss anemia   HTN (hypertension)   Acute lower GI bleeding   Gastric polyps   Colonic mass   Diverticulosis of colon without hemorrhage   Adenomatous polyp of sigmoid colon   Malignant neoplasm of descending colon (HCC)   Lower GI bleed/hematochezia with prior history of sigmoid diverticulosis.   GI consulted underwent EGD and colonoscopy was found to have a malignant mass at the splenic flexure of the colon. Biopsy results showed adenocarcinoma. General surgery consulted and she underwent laparoscopic partial colectomy on 06/12/20. She was found to have mid transverse colon mass, with thickened mesentry with numerous enlarged LN, nodular liver.  She was started on clear liquid diet and advanced to  Soft diet. She had 2 bowel movements yesterday.  Pathology showed invasive adenocarcinoma with involvement of lymph nodes 3/14.    Malignant mass in the colon at the splenic flexure  she underwent laparoscopic partial colectomy on 06/12/20. She was found to have mid transverse colon mass, with thickened mesentry with numerous enlarged LN, nodular liver.  Oncology on board, further recommendations to follow.   Pathology showed invasive adenocarcinoma with involvement of lymph nodes 3/14.    Acute blood loss anemia Patient baseline hemoglobin around 13 admitted with a hemoglobin of 11.8, dropped to hemoglobin of 9.5 to 9.3 Transfuse to keep hemoglobin greater than 8.    Mild thrombocytopenia:  Improving platelet count. No signs of bleeding.  Continue to monitor.    Hypertension:  Better controlled.    H/o diverticulosis.  No bleeding overnight.     Hypokalemia:  Replaced.    GERD:  Stable on PPI .  Frequent Urinary:  UA is negative for UTI.    Discharge Instructions  Discharge Instructions    Diet - low sodium heart healthy   Complete by: As directed    Increase activity slowly   Complete by: As directed    No wound care   Complete by: As directed      Allergies as of 06/17/2020      Reactions   Darvon [propoxyphene] Nausea And Vomiting, Palpitations   Darvocet Causes Sweats   Adhesive [tape]    Morphine And Related Nausea And Vomiting      Medication List    STOP taking these medications   cyclobenzaprine 5 MG tablet Commonly known as: FLEXERIL   ergocalciferol 1.25 MG (50000 UT) capsule Commonly known as: VITAMIN D2   furosemide 20 MG tablet Commonly  known as: LASIX   metroNIDAZOLE 1 % gel Commonly known as: Metrogel   potassium chloride 10 MEQ tablet Commonly known as: KLOR-CON     TAKE these medications   feeding supplement Liqd Take 237 mLs by mouth 2 (two) times daily between meals.   fenofibrate 48 MG tablet Commonly known as: Tricor Take 1 tablet (48 mg  total) by mouth daily.   gabapentin 300 MG capsule Commonly known as: NEURONTIN Take 1 capsule (300 mg total) by mouth 2 (two) times daily.   HYDROcodone-acetaminophen 10-325 MG tablet Commonly known as: NORCO Take 1 tablet by mouth every 6 (six) hours as needed. What changed: reasons to take this   losartan 25 MG tablet Commonly known as: COZAAR Take 1 tablet (25 mg total) by mouth daily.   moxifloxacin 0.5 % ophthalmic solution Commonly known as: VIGAMOX   Multivitamin Adults Tabs Take 1 tablet by mouth daily.   pantoprazole 40 MG tablet Commonly known as: PROTONIX Take 1 tablet (40 mg total) by mouth 2 (two) times daily.       Follow-up Information    Copland, Gay Filler, MD Follow up.   Specialty: Family Medicine Contact information: [address] [place] [address] [phone]        [name], MD. Schedule an appointment as soon as possible for a visit in 3 week(s).   Specialty: General Surgery Why: Please call our office to confirm your appointment time. We are working hard to make this for you. Please bring a copy of your photo ID and insurance card. Please arrive 30 minutes prior to your appointment.  Contact information: [address] [place] [address] [phone]        [name], MD. Schedule an appointment as soon as possible for a visit.   Specialty: Oncology Contact information: [place] [address] [phone]              Allergies  Allergen Reactions  . Darvon [Propoxyphene] Nausea And Vomiting and Palpitations    Darvocet Causes Sweats  . Adhesive [Tape]   . Morphine And Related Nausea And Vomiting    Consultations:  General surgery   Oncology.    Procedures/Studies: CT CHEST W CONTRAST  Result Date: 06/10/2020 CLINICAL DATA:  87 year old female with colorectal cancer. EXAM: CT CHEST WITH CONTRAST TECHNIQUE: Multidetector CT imaging of  the chest was performed during intravenous contrast administration. CONTRAST:  6mL OMNIPAQUE IOHEXOL 300 MG/ML  SOLN COMPARISON:  CT dated 04/30/2019. FINDINGS: Cardiovascular: Top-normal cardiac size. No pericardial effusion. There is coronary vascular calcification. Moderate atherosclerotic calcification of the thoracic aorta. No aneurysmal dilatation or dissection. The origins of the great vessels of the aortic arch appear patent as visualized. No pulmonary artery embolus identified. Mediastinum/Nodes: 2 adjacent lymph nodes along the right pericardium/posterior cardiophrenic angle noted with the larger measuring 12 mm in short axis (previously 14 mm). A nodular density along the anterior right cardiophrenic angle measures 8 mm in short axis (previously 11 mm). Right anterior costophrenic lymph nodes measure 5 mm short axis (previously 7 mm). No new adenopathy. Bilateral axillary dissection clips. The esophagus and the thyroid gland are grossly unremarkable. No mediastinal fluid collection. Lungs/Pleura: Bibasilar linear atelectasis/scarring. No focal consolidation, pleural effusion, pneumothorax. The central airways are patent. Upper Abdomen: Cholecystectomy. Several upper abdominal and periportal lymph nodes similar or slightly decreased since the prior CT. Musculoskeletal: Osteopenia with degenerative changes of the spine. No  acute osseous pathology. IMPRESSION: 1. No acute intrathoracic pathology. 2. Slight interval decrease in the size of the select lymph nodes since the prior CT. No new adenopathy in the chest. 3. Aortic Atherosclerosis (ICD10-I70.0). Electronically Signed   By: Anner Crete M.D.   On: 06/10/2020 18:20   DG CHEST PORT 1 VIEW  Result Date: 06/08/2020 CLINICAL DATA:  [place] EXAM: PORTABLE CHEST 1 VIEW COMPARISON:  06/18/2017 FINDINGS: Heart and mediastinal contours are within normal limits. No focal opacities or effusions. No acute bony abnormality. Aortic atherosclerosis IMPRESSION:  No active disease. Electronically Signed   By: Rolm Baptise M.D.   On: 06/08/2020 21:24   CT Angio Abd/Pel W and/or Wo Contrast  Result Date: 06/08/2020 CLINICAL DATA:  87 year old female with GI bleed. EXAM: CTA ABDOMEN AND PELVIS WITHOUT AND WITH CONTRAST TECHNIQUE: Multidetector CT imaging of the abdomen and pelvis was performed using the standard protocol during bolus administration of intravenous contrast. Multiplanar reconstructed images and MIPs were obtained and reviewed to evaluate the vascular anatomy. CONTRAST:  176mL OMNIPAQUE IOHEXOL 350 MG/ML SOLN COMPARISON:  CT of the chest abdomen pelvis dated 04/30/2019. FINDINGS: VASCULAR Aorta: Moderate atherosclerotic calcification of the aorta. No aneurysmal dilatation or dissection. No periaortic fluid collection or hematoma. Celiac: Patent without evidence of aneurysm, dissection, vasculitis or significant stenosis. SMA: Atherosclerotic calcification of the origin of the SMA. The SMA is patent. Renals: Atherosclerotic calcification of the origin of the renal arteries. The renal arteries are patent. IMA: Patent without evidence of aneurysm, dissection, vasculitis or significant stenosis. Inflow: Patent without evidence of aneurysm, dissection, vasculitis or significant stenosis. Proximal Outflow: Bilateral common femoral and visualized portions of the superficial and profunda femoral arteries are patent without evidence of aneurysm, dissection, vasculitis or significant stenosis. Veins: The IVC is unremarkable. The SMV, splenic vein, and main portal vein are patent. No portal venous gas. Review of the MIP images confirms the above findings. NON-VASCULAR Lower chest: The visualized lung bases are clear. There is coronary vascular calcification. No intra-abdominal free air or free fluid. Hepatobiliary: The liver is unremarkable. No intrahepatic biliary dilatation. Cholecystectomy. Retained calcified stone noted in the central CBD. Pancreas: Unremarkable. No  pancreatic ductal dilatation or surrounding inflammatory changes. Spleen: Normal in size without focal abnormality. Adrenals/Urinary Tract: The adrenal glands unremarkable. Probable small bilateral parapelvic cyst third the kidneys, visualized ureters, and urinary bladder appear unremarkable. Stomach/Bowel: There is moderate amount of stool throughout the colon. There is sigmoid diverticulosis without active inflammatory changes. There is a focal area of wall thickening and mild haziness involving approximately 4.5 cm segment of the distal transverse colon (coronal 28/6 and axial 52/10). This may represent a focal area of inflammation but concerning for underlying infiltrative neoplasm. Further evaluation with colonoscopy is recommended. There is no bowel obstruction. The appendix is not visualized with certainty. No inflammatory changes identified in the right lower quadrant. Lymphatic: Several mildly rounded and enlarged lymph node adjacent to the inflamed segment of distal transverse colon. The larger lymph node measures approximately 11 mm in short axis. Reproductive: Hysterectomy. No adnexal masses. Other: None Musculoskeletal: Osteopenia with degenerative changes of the spine. Bilateral total hip arthroplasties. Partially visualized breast prostheses. No acute osseous pathology. L3 vertebroplasty. IMPRESSION: 1. Focal area of inflammation or infiltrative neoplasm involving distal transverse colon. Further evaluation with colonoscopy is recommended. 2. Sigmoid diverticulosis.  No bowel obstruction. 3. Aortic Atherosclerosis (ICD10-I70.0). Electronically Signed   By: Anner Crete M.D.   On: 06/08/2020 17:30      Subjective: No  new complaints.   Discharge Exam: Vitals:   06/16/20 2045 06/17/20 0527  BP: (!) 157/80 (!) 150/63  Pulse: 81 75  Resp: 16 14  Temp: 98.6 F (37 C) 98.3 F (36.8 C)  SpO2: 96% 94%   Vitals:   06/16/20 0603 06/16/20 1411 06/16/20 2045 06/17/20 0527  BP: (!) 162/81  (!) 156/61 (!) 157/80 (!) 150/63  Pulse: 73 84 81 75  Resp: 19 17 16 14   Temp: 98.6 F (37 C) 98.4 F (36.9 C) 98.6 F (37 C) 98.3 F (36.8 C)  TempSrc:  Oral Oral Oral  SpO2: 94% 97% 96% 94%  Weight:      Height:        General: Pt is alert, awake, not in acute distress Cardiovascular: RRR, S1/S2 +, no rubs, no gallops Respiratory: CTA bilaterally, no wheezing, no rhonchi Abdominal: Soft, NT, ND, bowel sounds + Extremities: no edema, no cyanosis    The results of significant diagnostics from this hospitalization (including imaging, microbiology, ancillary and laboratory) are listed below for reference.     Microbiology: Recent Results (from the past 240 hour(s))  Resp Panel by RT-PCR (Flu A&B, Covid)     Status: None   Collection Time: 06/08/20  4:53 PM   Specimen: Nasopharyngeal(NP) swabs in vial transport medium  Result Value Ref Range Status   SARS Coronavirus 2 by RT PCR NEGATIVE NEGATIVE Final    Comment: (NOTE) SARS-CoV-2 target nucleic acids are NOT DETECTED.  The SARS-CoV-2 RNA is generally detectable in upper respiratory specimens during the acute phase of infection. The lowest concentration of SARS-CoV-2 viral copies this assay can detect is 138 copies/mL. A negative result does not preclude SARS-Cov-2 infection and should not be used as the sole basis for treatment or other patient management decisions. A negative result may occur with  improper specimen collection/handling, submission of specimen other than nasopharyngeal swab, presence of viral mutation(s) within the areas targeted by this assay, and inadequate number of viral copies(<138 copies/mL). A negative result must be combined with clinical observations, patient history, and epidemiological information. The expected result is Negative.  Fact Sheet for Patients:  EntrepreneurPulse.com.au  Fact Sheet for Healthcare Providers:  IncredibleEmployment.be  This  test is no t yet approved or cleared by the [place] FDA and  has been authorized for detection and/or diagnosis of SARS-CoV-2 by FDA under an Emergency Use Authorization (EUA). This EUA will remain  in effect (meaning this test can be used) for the duration of the COVID-19 declaration under Section 564(b)(1) of the Act, 21 U.S.C.section 360bbb-3(b)(1), unless the authorization is terminated  or revoked sooner.       Influenza A by PCR NEGATIVE NEGATIVE Final   Influenza B by PCR NEGATIVE NEGATIVE Final    Comment: (NOTE) The Xpert Xpress SARS-CoV-2/FLU/RSV plus assay is intended as an aid in the diagnosis of influenza from Nasopharyngeal swab specimens and should not be used as a sole basis for treatment. Nasal washings and aspirates are unacceptable for Xpert Xpress SARS-CoV-2/FLU/RSV testing.  Fact Sheet for Patients: EntrepreneurPulse.com.au  Fact Sheet for Healthcare Providers: IncredibleEmployment.be  This test is not yet approved or cleared by the [place] FDA and has been authorized for detection and/or diagnosis of SARS-CoV-2 by FDA under an Emergency Use Authorization (EUA). This EUA will remain in effect (meaning this test can be used) for the duration of the COVID-19 declaration under Section 564(b)(1) of the Act, 21 U.S.C. section 360bbb-3(b)(1), unless the authorization is terminated or revoked.  Performed at [facility], [address] [place], [place] [address]   Culture, Urine     Status: Abnormal   Collection Time: 06/08/20  8:18 PM   Specimen: Urine, Clean Catch  Result Value Ref Range Status   Specimen Description   Final    URINE, CLEAN CATCH Performed at [facility], [place] [address] [place], [place] [address]    Special Requests   Final    NONE Performed at [facility], [place] [address], [place], [place] [address]    Culture (A)  Final    <10,000  COLONIES/mL INSIGNIFICANT GROWTH Performed at [facility] [address] [place], [place] [address]    Report Status 06/10/2020 FINAL  Final     Labs: BNP (last 3 results) No results for input(s): BNP in the last 8760 hours. Basic Metabolic Panel: Recent Labs  Lab 06/11/20 0557 06/12/20 0627 06/13/20 0617 06/14/20 1442 06/15/20 0555  NA 142 140 140 141 141  K 3.0* 3.6 3.4* 3.5 3.6  CL 114* 112* 112* 112* 110  CO2 20* 19* 23 22 24   GLUCOSE 97 92 117* 124* 100*  BUN 5* <5* <5* 5* 7*  CREATININE 0.55 0.54 0.52 0.58 0.49  CALCIUM 8.0* 8.2* 8.0* 8.1* 8.6*   Liver Function Tests: Recent Labs  Lab 06/12/20 0627  AST 21  ALT 16  ALKPHOS 41  BILITOT 0.7  PROT 5.4*  ALBUMIN 3.0*   No results for input(s): LIPASE, AMYLASE in the last 168 hours. No results for input(s): AMMONIA in the last 168 hours. CBC: Recent Labs  Lab 06/11/20 0557 06/12/20 0627 06/13/20 0617 06/15/20 0555 06/16/20 0558  WBC 7.7 5.3 9.2 6.2 5.3  HGB 9.8* 9.6* 9.5* 8.7* 9.3*  HCT 31.2* 30.4* 29.5* 27.7* 30.0*  MCV 94.5 92.1 91.0 92.6 94.6  PLT 131* 130* 141* 144* 154   Cardiac Enzymes: No results for input(s): CKTOTAL, CKMB, CKMBINDEX, TROPONINI in the last 168 hours. BNP: Invalid input(s): POCBNP CBG: No results for input(s): GLUCAP in the last 168 hours. D-Dimer No results for input(s): DDIMER in the last 72 hours. Hgb A1c No results for input(s): HGBA1C in the last 72 hours. Lipid Profile No results for input(s): CHOL, HDL, LDLCALC, TRIG, CHOLHDL, LDLDIRECT in the last 72 hours. Thyroid function studies No results for input(s): TSH, T4TOTAL, T3FREE, THYROIDAB in the last 72 hours.  Invalid input(s): FREET3 Anemia work up No results for input(s): VITAMINB12, FOLATE, FERRITIN, TIBC, IRON, RETICCTPCT in the last 72 hours. Urinalysis    Component Value Date/Time   COLORURINE YELLOW 06/17/2020 0808   APPEARANCEUR CLEAR 06/17/2020 0808   LABSPEC 1.011 06/17/2020 0808   PHURINE  7.0 06/17/2020 0808   GLUCOSEU NEGATIVE 06/17/2020 0808   GLUCOSEU NEGATIVE 04/20/2019 1017   HGBUR NEGATIVE 06/17/2020 0808   BILIRUBINUR NEGATIVE 06/17/2020 0808   BILIRUBINUR negative 06/29/2019 1615   KETONESUR NEGATIVE 06/17/2020 0808   PROTEINUR NEGATIVE 06/17/2020 0808   UROBILINOGEN 0.2 06/29/2019 1615   UROBILINOGEN 2.0 (A) 04/20/2019 1017   NITRITE NEGATIVE 06/17/2020 0808   LEUKOCYTESUR TRACE (A) 06/17/2020 0808   Sepsis Labs Invalid input(s): PROCALCITONIN,  WBC,  LACTICIDVEN Microbiology Recent Results (from the past 240 hour(s))  Resp Panel by RT-PCR (Flu A&B, Covid)     Status: None   Collection Time: 06/08/20  4:53 PM   Specimen: Nasopharyngeal(NP) swabs in vial transport medium  Result Value Ref Range Status   SARS Coronavirus 2 by RT PCR  NEGATIVE NEGATIVE Final    Comment: (NOTE) SARS-CoV-2 target nucleic acids are NOT DETECTED.  The SARS-CoV-2 RNA is generally detectable in upper respiratory specimens during the acute phase of infection. The lowest concentration of SARS-CoV-2 viral copies this assay can detect is 138 copies/mL. A negative result does not preclude SARS-Cov-2 infection and should not be used as the sole basis for treatment or other patient management decisions. A negative result may occur with  improper specimen collection/handling, submission of specimen other than nasopharyngeal swab, presence of viral mutation(s) within the areas targeted by this assay, and inadequate number of viral copies(<138 copies/mL). A negative result must be combined with clinical observations, patient history, and epidemiological information. The expected result is Negative.  Fact Sheet for Patients:  EntrepreneurPulse.com.au  Fact Sheet for Healthcare Providers:  IncredibleEmployment.be  This test is no t yet approved or cleared by the [place] FDA and  has been authorized for detection and/or diagnosis of SARS-CoV-2  by FDA under an Emergency Use Authorization (EUA). This EUA will remain  in effect (meaning this test can be used) for the duration of the COVID-19 declaration under Section 564(b)(1) of the Act, 21 U.S.C.section 360bbb-3(b)(1), unless the authorization is terminated  or revoked sooner.       Influenza A by PCR NEGATIVE NEGATIVE Final   Influenza B by PCR NEGATIVE NEGATIVE Final    Comment: (NOTE) The Xpert Xpress SARS-CoV-2/FLU/RSV plus assay is intended as an aid in the diagnosis of influenza from Nasopharyngeal swab specimens and should not be used as a sole basis for treatment. Nasal washings and aspirates are unacceptable for Xpert Xpress SARS-CoV-2/FLU/RSV testing.  Fact Sheet for Patients: EntrepreneurPulse.com.au  Fact Sheet for Healthcare Providers: IncredibleEmployment.be  This test is not yet approved or cleared by the [place] FDA and has been authorized for detection and/or diagnosis of SARS-CoV-2 by FDA under an Emergency Use Authorization (EUA). This EUA will remain in effect (meaning this test can be used) for the duration of the COVID-19 declaration under Section 564(b)(1) of the Act, 21 U.S.C. section 360bbb-3(b)(1), unless the authorization is terminated or revoked.  Performed at [facility], [address] [place], [place] [address]   Culture, Urine     Status: Abnormal   Collection Time: 06/08/20  8:18 PM   Specimen: Urine, Clean Catch  Result Value Ref Range Status   Specimen Description   Final    URINE, CLEAN CATCH Performed at [facility], [place] [address], [place], [place] [address]    Special Requests   Final    NONE Performed at [facility], [place] [address] [place], [place] [address]    Culture (A)  Final    <10,000 COLONIES/mL INSIGNIFICANT GROWTH Performed at [facility] [address], [place], [place] [address]    Report Status  06/10/2020 FINAL  Final     Time coordinating discharge: 33 minutes.  SIGNED:   Hosie Poisson, MD  [facility]

## 2020-06-17 NOTE — Progress Notes (Signed)
Looks like the pathology report is back.  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  This is a poorly differentiated.  Margins are negative.  3/14 lymph nodes were positive.  There is lymphovascular space invasion.  This is more extensive than I would have thought.  She has a significant risk for recurrence.  As such, I think were probably going to have to think about some kind of adjuvant therapy.  I am not sure that she is a candidate for FOLFOX.  We probably will consider Xeloda which is oral therapy.  We will await the results of the MSI and MMR studies.  I will also try to send off BRAF analysis.  She is eating a little bit better.  She still has a little bit of nausea.  She is out of bed.  I think she has had bowel movement.  There is more abdominal sounds when I listen to her belly.  There is no fever.  She is getting Lovenox injections.  I wonder if she will need Lovenox as an outpatient for couple weeks given that she does have an active malignancy.  It will be interesting to see what her CEA level will be in about a month or so.  Hopefully, she will be able to go home tomorrow.  We will follow-up with her as an outpatient in about 3 weeks.  By then, we will have what we need with respect to deciding about adjuvant therapy.  Lattie Haw, MD  Hebrews 11:1

## 2020-06-17 NOTE — Progress Notes (Signed)
While rounding on unit Chaplain rcvd referral to Mrs Dimitroff who has received some difficult-to-hear news today and could maybe use some comfort and care at this time.  Mrs. Waren did welcome a visit. Chaplain initiated a relationship of care and concern.  Chaplain offered ministry of presence while listening to Mrs. Haltiwanger share her story.  She was very sad but stoic in the telling of it.  As time went on she displayed her resilience and her resolve to go home.  She described all the resources she has in place to help her so she doesn't have to go to rehab upon discharge.  We had a faith talk too and prayed together.    Chaplain and pt agreed to another visit tomorrow morning.  [place]

## 2020-06-18 ENCOUNTER — Other Ambulatory Visit: Payer: Self-pay | Admitting: Hematology & Oncology

## 2020-06-18 LAB — SURGICAL PATHOLOGY

## 2020-06-18 NOTE — Discharge Instructions (Signed)

## 2020-06-18 NOTE — Progress Notes (Signed)
87 year old female with a history of hypertension, hyperlipidemia, colon polyps presents with rectal bleeding.  Colonoscopy was done which showed mass at splenic flexure suspicious for malignancy.  General surgery was consulted she underwent laparoscopic-assisted partial colectomy on 06/12/2020.  She is tolerating soft diet.  General surgery has signed off.  Pathology showed invasive adenocarcinoma with involvement of lymph nodes 3/14.  Oncology has been consulted, patient to follow-up Dr. Marin Olp as outpatient Patient complains of nausea after eating this morning, will give 1 dose of Zofran x1. Patient is cleared for discharge as per general surgery.

## 2020-06-18 NOTE — Progress Notes (Signed)
6 Days Post-Op  Subjective: CC: Doing well. Tolerating soft diet without increased abdominal pain or emesis. Passing flatus. BM this Am. Some soreness around incisions that are well controlled. Mobilizing.   Objective: Vital signs in last 24 hours: Temp:  [97.6 F (36.4 C)-98.5 F (36.9 C)] 97.6 F (36.4 C) (01/19 0450) Pulse Rate:  [79-95] 95 (01/19 0450) Resp:  [16-18] 18 (01/19 0450) BP: (132-152)/(58-66) 132/66 (01/19 0450) SpO2:  [95 %-98 %] 98 % (01/19 0450) Last BM Date: 06/16/20  Intake/Output from previous day: 01/18 0701 - 01/19 0700 In: 720 [P.O.:720] Out: -  Intake/Output this shift: No intake/output data recorded.  PE: Gen:  Alert, NAD, pleasant Card:  RRR Pulm:  CTAB, rate and effort normal Abd: Soft, ND, appropriately tender around incisions, +BS. Incisions with glue intact appears well and are without drainage, bleeding, or signs of infection Ext:  No LE pitting edema  Psych: A&Ox3  Skin: no rashes noted, warm and dry  Lab Results:  Recent Labs    06/16/20 0558  WBC 5.3  HGB 9.3*  HCT 30.0*  PLT 154   BMET No results for input(s): NA, K, CL, CO2, GLUCOSE, BUN, CREATININE, CALCIUM in the last 72 hours. PT/INR No results for input(s): LABPROT, INR in the last 72 hours. CMP     Component Value Date/Time   NA 141 06/15/2020 0555   NA 139 02/25/2020 1627   K 3.6 06/15/2020 0555   CL 110 06/15/2020 0555   CO2 24 06/15/2020 0555   GLUCOSE 100 (H) 06/15/2020 0555   BUN 7 (L) 06/15/2020 0555   BUN 11 02/25/2020 1627   CREATININE 0.49 06/15/2020 0555   CREATININE 0.66 04/07/2020 1331   CALCIUM 8.6 (L) 06/15/2020 0555   PROT 5.4 (L) 06/12/2020 0627   PROT 6.8 02/09/2017 1039   ALBUMIN 3.0 (L) 06/12/2020 0627   ALBUMIN 4.1 02/09/2017 1039   AST 21 06/12/2020 0627   AST 59 (H) 04/07/2020 1331   ALT 16 06/12/2020 0627   ALT 38 04/07/2020 1331   ALKPHOS 41 06/12/2020 0627   BILITOT 0.7 06/12/2020 0627   BILITOT 0.6 04/07/2020 1331    GFRNONAA >60 06/15/2020 0555   GFRNONAA >60 04/07/2020 1331   GFRAA 93 02/25/2020 1627   GFRAA >60 12/05/2019 1251   Lipase     Component Value Date/Time   LIPASE 151 04/30/2010 1126       Studies/Results: No results found.  Anti-infectives: Anti-infectives (From admission, onward)   Start     Dose/Rate Route Frequency Ordered Stop   06/12/20 0600  cefoTEtan (CEFOTAN) 2 g in sodium chloride 0.9 % 100 mL IVPB        2 g 200 mL/hr over 30 Minutes Intravenous On call to O.R. 06/11/20 1417 06/12/20 1002       Assessment/Plan Hx GI bleed Hx diverticulosis GERD Anemia Hypertension Hx stage I breast cancer, Hx melanoma Hyperlipidemia Osteoporosis  Mid transverse colon mass - Adenocarcinoma  S/p Laparoscopic assisted transverse colectomy, 06/12/2020, DR. [name]  - POD #6 - Path as noted below (invasive adenocarcinoma, pT4b, pN1b). Discussed with patient yesterday - Dr. Marin Olp has already seen inpatient. Will need follow up with his office - Follow up arranged with our office. - Cleared by PT/OT for no f/u. OT recommending intermittent supervision. Her daughter lives in [place]. - The patient is voiding well, tolerating diet, ambulating well, pain well controlled, vital signs stable, incisions c/d/i, having bowel function and felt stable for discharge home  from a surgery standpoint.  FEN: Soft diet  ID: Cefotetan periop DVT: SCDs, Lovenox Follow-up: Dr. Ninfa Linden, Dr. Marin Olp   Path  A. COLON, TRANSVERSE, RESECTION:  - Invasive adenocarcinoma, 4.5 cm.  - Carcinoma invades pericolonic connective tissue and into attached  omentum.  - Margins not involved.  - Metastatic carcinoma in three of fourteen lymph nodes (3/14).  - See oncology table.    LOS: 9 days    Jillyn Ledger , [facility] 06/18/2020, 8:19 AM Please see Amion for pager number during day hours 7:00am-4:30pm

## 2020-06-19 ENCOUNTER — Telehealth: Payer: Self-pay

## 2020-06-19 ENCOUNTER — Encounter: Payer: Medicare Other | Admitting: Registered Nurse

## 2020-06-19 NOTE — Telephone Encounter (Signed)
Transition Care Management Follow-up Telephone Call  Date of discharge and from where: 06/18/20-[facility]  How have you been since you were released from the hospital? Doing OK-Hanging in there  Any questions or concerns? No  Items Reviewed:  Did the pt receive and understand the discharge instructions provided? Yes   Medications obtained and verified? Yes   Other? Yes   Any new allergies since your discharge? No   Dietary orders reviewed? Yes  Do you have support at home? Yes   Home Care and Equipment/Supplies: Were home health services ordered? no If so, what is the name of the agency? n/a  Has the agency set up a time to come to the patient's home? not applicable Were any new equipment or medical supplies ordered?  No What is the name of the medical supply agency? n/a Were you able to get the supplies/equipment? not applicable Do you have any questions related to the use of the equipment or supplies? N/A  Functional Questionnaire: (I = Independent and D = Dependent) ADLs: I  Bathing/Dressing- I  Meal Prep- I  Eating- I  Maintaining continence- I  Transferring/Ambulation- I  Managing Meds- I  Follow up appointments reviewed:   PCP Hospital f/u appt confirmed? Yes  Scheduled to see Dr. Lorelei Pont on 06/25/20 @ 2:00.  [facility] f/u appt confirmed? Yes  Scheduled to see Dr.Vito  Cirigliano on 07/07/20 @ 2:00.  Are transportation arrangements needed? No   If their condition worsens, is the pt aware to call PCP or go to the Emergency Dept.? Yes  Was the patient provided with contact information for the PCP's office or ED? Yes  Was to pt encouraged to call back with questions or concerns? Yes

## 2020-06-22 NOTE — Progress Notes (Addendum)
[facility] [address], [address] [place], [place] [address] [phone]  Date:  06/25/2020   Name:  Crystal Lee   DOB:  1934/04/14   MRN:  [id number]  PCP:  Pearline Cables, MD    Chief Complaint: Hospitalization Follow-up (Ensure caused vomiting, still experiencing nausea, ) and Constipation (/)   History of Present Illness:  Crystal Lee is a 87 y.o. very pleasant female patient who presents with the following:  History of breast cancer status post bilateral mastectomy, osteoporosis, hypertension, hyperlipidemia, postlaminectomy syndrome Following up today- last seen by myself in August Recent admission to the hospital for lower GI bleed-unfortunately during this evaluation she was diagnosed with colon cancer.  She underwent a surgical resection and is now back home  Admit date: 06/08/2020 Discharge date: 06/18/2020 Recommendations for Outpatient Follow-up:  1. Follow up with PCP in 1-2 weeks 2. Please obtain BMP/CBC in one week Please follow up with oncology as recommended.   Brief/Interim Summary: 87 year old lady with prior history of hypertension hyperlipidemia stage I breast cancer, melanoma GERD, osteoporosis, colon polyps presents to ED with rectal bleeding. GI consulted patient underwent colonoscopy and was found to have a mass at the splenic flexure suspicious for malignancy. General surgery consulted and she is underwent laparoscopic assisted partial colectomy on 06/12/20. She was started on clear liquids and advanced to soft diet. Pathology showed invasive adenocarcinoma with involvement of lymph nodes 3/14.   Discharge Diagnoses:  Principal Problem:   Lower GI bleed Active Problems:   GERD (gastroesophageal reflux disease)   Acute blood loss anemia   HTN (hypertension)   Acute lower GI bleeding   Gastric polyps   Colonic mass   Diverticulosis of colon without hemorrhage   Adenomatous polyp of sigmoid  colon   Malignant neoplasm of descending colon (HCC)  Lower GI bleed/hematochezia with prior history of sigmoid diverticulosis.  GI consulted underwent EGD and colonoscopy was found to have a malignant mass at the splenic flexure of the colon. Biopsy results showed adenocarcinoma. General surgery consulted and she underwent laparoscopic partial colectomy on 06/12/20. She was found to have mid transverse colon mass, with thickened mesentry with numerous enlarged LN, nodular liver.  She was started on clear liquid diet and advanced toSoft diet. She had 2 bowel movements yesterday. Pathology showed invasive adenocarcinoma with involvement of lymph nodes 3/14.  Malignant mass in the colon at the splenic flexure she underwent laparoscopic partial colectomy on 06/12/20. She was found to have mid transverse colon mass, with thickened mesentry with numerous enlarged LN, nodular liver.  Oncology on board, further recommendations to follow.   Pathology showed invasive adenocarcinoma with involvement of lymph nodes 3/14.  Acute blood loss anemia Patient baseline hemoglobin around 13 admitted with a hemoglobin of 11.8, dropped to hemoglobin of 9.5to 9.3 Transfuse to keep hemoglobin greater than 8.  Mild thrombocytopenia:  Improving platelet count. No signs of bleeding.  Continue to monitor.   Hypertension:  Better controlled.   H/o diverticulosis.  No bleeding overnight.   Hypokalemia:  Replaced.   GERD:  Stable on PPI .  Frequent Urinary:  UA is negative for UTI.    covid 19 booster- encouraged her to do asap  Flu vaccine - pt declines  She is home, her daughter is with her and her out for now Pt states she is feeling nauseated and having difficulty eating She was started on ensure recently but could not tolerate  She had tried some zofran but it did not seem to help -she wonders about using Phenergan She is trying to eat and drink as much as she can No diarrhea- she  notes constipation   She is concerned because her oncology appointment is not for another month-she is seeing GI in about 10 days  Wt Readings from Last 3 Encounters:  06/25/20 216 lb (98 kg)  06/23/20 220 lb 14.4 oz (100.2 kg)  06/13/20 223 lb 12.3 oz (101.5 kg)    Patient Active Problem List   Diagnosis Date Noted  . Malignant neoplasm of descending colon (HCC)   . Gastric polyps   . Colonic mass   . Diverticulosis of colon without hemorrhage   . Adenomatous polyp of sigmoid colon   . Acute lower GI bleeding 06/09/2020  . Lower GI bleed 06/08/2020  . Acute blood loss anemia 06/08/2020  . HTN (hypertension) 06/08/2020  . Constipation   . Encounter for orthopedic follow-up care 06/18/2019  . Urinary tract infection 04/20/2019  . Acute pain of right wrist 03/28/2019  . Tenosynovitis, wrist 03/28/2019  . Acquired trigger finger of right little finger 01/15/2019  . Carpal tunnel syndrome of right wrist 01/15/2019  . Aftercare 08/17/2018  . Lumbar post-laminectomy syndrome 07/31/2018  . Heartburn   . Fracture of superior pubic ramus (HCC) 06/16/2018  . Radial styloid tenosynovitis of left hand 05/16/2018  . Pain of left hand 04/20/2018  . Osteopenia 04/15/2018  . Dyslipidemia 02/02/2018  . GERD (gastroesophageal reflux disease) 02/02/2018  . Trochanteric bursitis of left hip 12/23/2017  . Primary osteoarthritis of left knee 10/17/2017  . History of right knee joint replacement 10/17/2017  . Pain in both lower extremities 08/02/2017  . Stage 1 breast cancer, ER+, right (HCC) 07/04/2017  . 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 Pain in right hand 06/10/2017  . Trigger finger of right hand 06/10/2017  . Paresthesia 02/09/2017  . Low back pain 02/09/2017  . Gait abnormality 02/09/2017  . OA (osteoarthritis) of hip 05/09/2015    Past Medical History:  Diagnosis Date  . Arthritis   . Cancer (HCC)    HX BREAST CANCER/ SKIN CANCER  . Complication of  anesthesia    N/V WITH MORPHINE  . Difficulty sleeping   . Fractured hip (HCC)    LEFT - AUG 2016  . GERD (gastroesophageal reflux disease)   . Hyperlipidemia   . Hypertension   . Melanoma (HCC)   . Neuropathy   . Nocturia   . Osteopenia   . 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 PONV (postoperative nausea and vomiting)    PT STATES MORPHINE CAUSED N/V  . Rosacea   . Sleep apnea   . Stage 1 breast cancer, ER+, right (HCC) 07/04/2017    Past Surgical History:  Procedure Laterality Date  . 24 HOUR PH STUDY N/A 06/21/2018   Procedure: 24 HOUR PH STUDY;  Surgeon: Shellia Cleverly, DO;  Location: [facility];  Service: Gastroenterology;  Laterality: N/A;  with impedance  . ABDOMINAL HYSTERECTOMY  2010  . ANAL RECTAL MANOMETRY N/A 09/19/2019   Procedure: ANO RECTAL MANOMETRY;  Surgeon: Napoleon Form, MD;  Location: [facility];  Service: Endoscopy;  Laterality: N/A;  . APPENDECTOMY  1949  . BACK SURGERY  1973  . BIOPSY  06/10/2020   Procedure: BIOPSY;  Surgeon: Shellia Cleverly, DO;  Location: [facility];  Service: Gastroenterology;;  EGD and COLON  . BREAST SURGERY    .  CATARACT EXTRACTION Bilateral 2011  . CHOLECYSTECTOMY    . COLON RESECTION N/A 06/12/2020   Procedure: LAPAROSCOPIC COLON RESECTION;  Surgeon: Abigail Miyamoto, MD;  Location: [facility];  Service: General;  Laterality: N/A;  . COLONOSCOPY WITH PROPOFOL N/A 06/10/2020   Procedure: COLONOSCOPY WITH PROPOFOL;  Surgeon: Shellia Cleverly, DO;  Location: [facility];  Service: Gastroenterology;  Laterality: N/A;  . ESOPHAGEAL MANOMETRY N/A 06/21/2018   Procedure: ESOPHAGEAL MANOMETRY (EM);  Surgeon: Shellia Cleverly, DO;  Location: [facility];  Service: Gastroenterology;  Laterality: N/A;  . ESOPHAGOGASTRODUODENOSCOPY (EGD) WITH PROPOFOL N/A 06/10/2020   Procedure: ESOPHAGOGASTRODUODENOSCOPY (EGD) WITH PROPOFOL;  Surgeon: Shellia Cleverly, DO;  Location: [facility];  Service: Gastroenterology;   Laterality: N/A;  . GALLBLADDER SURGERY  2015  . JOINT REPLACEMENT     RT TOTAL HIP / RT TOTAL KNEE  . MASTECTOMY  1998   BILATERAL   . PH IMPEDANCE STUDY  06/21/2018   Procedure: PH IMPEDANCE STUDY;  Surgeon: Shellia Cleverly, DO;  Location: [facility];  Service: Gastroenterology;;  . POLYPECTOMY  06/10/2020   Procedure: POLYPECTOMY;  Surgeon: Shellia Cleverly, DO;  Location: [facility];  Service: Gastroenterology;;  . SKIN CANCER EXCISION  2016   RT SIDE OF NOSE  . SUBMUCOSAL TATTOO INJECTION  06/10/2020   Procedure: SUBMUCOSAL TATTOO INJECTION;  Surgeon: Shellia Cleverly, DO;  Location: [facility];  Service: Gastroenterology;;  . TONSILLECTOMY  1953  . TOTAL HIP ARTHROPLASTY  2010   RIGHT  . TOTAL HIP ARTHROPLASTY Left 05/09/2015   Procedure: LEFT TOTAL HIP ARTHROPLASTY ANTERIOR APPROACH;  Surgeon: Ollen Gross, MD;  Location: [facility];  Service: Orthopedics;  Laterality: Left;  . TOTAL KNEE ARTHROPLASTY  2001  . TUMOR REMOVAL  2012   ABDOMINAL - NON CANCEROUS    Social History   Tobacco Use  . Smoking status: Former Smoker    Packs/day: 0.50    Years: 20.00    Pack years: 10.00    Quit date: 02/03/1976    Years since quitting: 44.4  . Smokeless tobacco: Never Used  Vaping Use  . Vaping Use: Never used  Substance Use Topics  . Alcohol use: No  . Drug use: No    Family History  Problem Relation Age of Onset  . Other Mother        complications from flu  . Other Father        unsure of cause  . Colon cancer Neg Hx     Allergies  Allergen Reactions  . Darvon [Propoxyphene] Nausea And Vomiting and Palpitations    Darvocet Causes Sweats  . Adhesive [Tape]   . Morphine And Related Nausea And Vomiting    Medication list has been reviewed and updated.  Current Outpatient Medications on File Prior to Visit  Medication Sig Dispense Refill  . cyclobenzaprine (FLEXERIL) 5 MG tablet Take 1 tablet (5 mg total) by mouth at bedtime as needed for muscle spasms. 90  tablet 0  . feeding supplement (ENSURE SURGERY) LIQD Take 237 mLs by mouth 2 (two) times daily between meals. 14220 mL 0  . fenofibrate (TRICOR) 48 MG tablet Take 1 tablet (48 mg total) by mouth daily. 90 tablet 3  . gabapentin (NEURONTIN) 300 MG capsule Take 1 capsule (300 mg total) by mouth 2 (two) times daily. 60 capsule 1  . HYDROcodone-acetaminophen (NORCO) 10-325 MG tablet Take 1 tablet by mouth every 6 (six) hours as needed. 120 tablet 0  . losartan (COZAAR) 25 MG  tablet Take 1 tablet (25 mg total) by mouth daily. 90 tablet 3  . Multiple Vitamins-Minerals (MULTIVITAMIN ADULTS) TABS Take 1 tablet by mouth daily.    . pantoprazole (PROTONIX) 40 MG tablet Take 1 tablet (40 mg total) by mouth 2 (two) times daily. 60 tablet 0   No current facility-administered medications on file prior to visit.    Review of Systems:  As per HPI- otherwise negative.   Physical Examination: Vitals:   06/25/20 1359  BP: 132/70  Pulse: 89  Resp: 15  SpO2: 98%   Vitals:   06/25/20 1359  Weight: 216 lb (98 kg)  Height: 5\' 6"  (1.676 m)   Body mass index is 34.86 kg/m. Ideal Body Weight: Weight in (lb) to have BMI = 25: 154.6  GEN: no acute distress.  Obese, generally looks well HEENT: Atraumatic, Normocephalic.  Ears and Nose: No external deformity. CV: RRR, No M/G/R. No JVD. No thrill. No extra heart sounds. PULM: CTA B, no wheezes, crackles, rhonchi. No retractions. No resp. distress. No accessory muscle use. ABD: S, ND, +BS. No rebound. No HSM. EXTR: No c/c/e PSYCH: Normally interactive. Conversant.  She has 3 incisions physical on her abdomen, very mildly tender at incision sites as expected Pt has mild redness of superior vertical inciison on her abd wall -appears consistent with possible cellulitis  Assessment and Plan: Hospital discharge follow-up - Plan: Basic metabolic panel, CBC  Lower GI bleed - Plan: CBC  Nausea - Plan: promethazine (PHENERGAN) 12.5 MG tablet  Cellulitis of  abdominal wall - Plan: cephALEXin (KEFLEX) 500 MG capsule  Patient following up today from recent hospital admission with GI bleed and diagnosis of colon cancer.  She has follow-up arranged with GI and oncology.  I will reach out to her oncologist as per patient request to inquire about an earlier visit  Her main concern today is nausea and also vomiting.  Unfortunately Zofran has not helped.  I prescribed Phenergan, advised this may cause drowsiness  She may also be developing some mild cellulitis at incision site.  We will treat with Keflex for 5 days  Check CBC and BMP today  Encourage patient to try eating a bland diet and to hydrate as well as she is able Recommended a stool softener for constipation Asked her to see me in 3 to 4 months assuming her other follow-up is going well  This visit occurred during the SARS-CoV-2 public health emergency.  Safety protocols were in place, including screening questions prior to the visit, additional usage of staff PPE, and extensive cleaning of exam room while observing appropriate contact time as indicated for disinfecting solutions.     Signed Abbe Amsterdam, MD  Received her labs 1/27- message to pt  Results for orders placed or performed in visit on 06/25/20  Basic metabolic panel  Result Value Ref Range   Sodium 139 135 - 145 mEq/L   Potassium 4.1 3.5 - 5.1 mEq/L   Chloride 103 96 - 112 mEq/L   CO2 30 19 - 32 mEq/L   Glucose, Bld 106 (H) 70 - 99 mg/dL   BUN 9 6 - 23 mg/dL   Creatinine, Ser 7.82 0.40 - 1.20 mg/dL   GFR 95.62 >13.08 mL/min   Calcium 9.2 8.4 - 10.5 mg/dL  CBC  Result Value Ref Range   WBC 6.4 4.0 - 10.5 K/uL   RBC 4.29 3.87 - 5.11 Mil/uL   Platelets 254.0 150.0 - 400.0 K/uL   Hemoglobin 12.4 12.0 - 15.0  g/dL   HCT 16.1 09.6 - 04.5 %   MCV 88.9 78.0 - 100.0 fl   MCHC 32.5 30.0 - 36.0 g/dL   RDW 40.9 (H) 81.1 - 91.4 %

## 2020-06-23 ENCOUNTER — Encounter: Payer: Self-pay | Admitting: Registered Nurse

## 2020-06-23 ENCOUNTER — Encounter: Payer: Medicare Other | Attending: Physical Medicine & Rehabilitation | Admitting: Registered Nurse

## 2020-06-23 ENCOUNTER — Other Ambulatory Visit: Payer: Self-pay

## 2020-06-23 VITALS — BP 138/68 | HR 81 | Ht 66.0 in | Wt 220.9 lb

## 2020-06-23 DIAGNOSIS — M7062 Trochanteric bursitis, left hip: Secondary | ICD-10-CM | POA: Diagnosis not present

## 2020-06-23 DIAGNOSIS — M62838 Other muscle spasm: Secondary | ICD-10-CM | POA: Diagnosis not present

## 2020-06-23 DIAGNOSIS — G894 Chronic pain syndrome: Secondary | ICD-10-CM | POA: Diagnosis not present

## 2020-06-23 DIAGNOSIS — S32000S Wedge compression fracture of unspecified lumbar vertebra, sequela: Secondary | ICD-10-CM | POA: Diagnosis not present

## 2020-06-23 DIAGNOSIS — M2550 Pain in unspecified joint: Secondary | ICD-10-CM | POA: Diagnosis not present

## 2020-06-23 DIAGNOSIS — M170 Bilateral primary osteoarthritis of knee: Secondary | ICD-10-CM

## 2020-06-23 DIAGNOSIS — M961 Postlaminectomy syndrome, not elsewhere classified: Secondary | ICD-10-CM

## 2020-06-23 DIAGNOSIS — Z5181 Encounter for therapeutic drug level monitoring: Secondary | ICD-10-CM

## 2020-06-23 DIAGNOSIS — R202 Paresthesia of skin: Secondary | ICD-10-CM | POA: Diagnosis not present

## 2020-06-23 DIAGNOSIS — Z79899 Other long term (current) drug therapy: Secondary | ICD-10-CM | POA: Diagnosis not present

## 2020-06-23 DIAGNOSIS — M7061 Trochanteric bursitis, right hip: Secondary | ICD-10-CM

## 2020-06-23 MED ORDER — HYDROCODONE-ACETAMINOPHEN 10-325 MG PO TABS: 1.0000 | ORAL_TABLET | Freq: Four times a day (QID) | ORAL | 0 refills | Status: DC | PRN

## 2020-06-23 MED ORDER — CYCLOBENZAPRINE HCL 5 MG PO TABS
5.0000 mg | ORAL_TABLET | Freq: Every evening | ORAL | 0 refills | Status: DC | PRN
Start: 2020-06-23 — End: 2020-11-11

## 2020-06-23 NOTE — Progress Notes (Signed)
Subjective:    Patient ID: Crystal Lee, female    DOB: [birth year], 87 y.o.   MRN: [id number]  HPI: Crystal Lee is a 87 y.o. female whose appointment was changed to a telephone call, she was recently discharge from hospital with a diagnosis of Adenocarcinoma. Crystal Lee agrees with telephone visit and verbalizes understanding.She states her  pain is located in her lower back, bilateral lower extremities with numbness and tingling, bilateral hip pain and bilateral knee pain. Also reports generalized joint pain. She rates her pain 7. Her current exercise regime is walking with her walker. Crystal Lee reports a few days of nausea and vomiting when she was taking Ensure, she has stopped taking the ensure. She was instructed to F/U with her PCP, she verbalizes understanding.   Crystal Lee Morphine equivalent is 40.00 MME.    Last Oral Swab was Performed on 11/22/2019, it was consistent.   Crystal Lee was admitted to [facility] on 06/08/2020 with rectal bleeding. GI and General Surgery  was consulted. She underwent : See Below By Dr. Bryan Lemma COLONOSCOPY WITH PROPOFOL N/A Monitor Anesthesia Care  ESOPHAGOGASTRODUODENOSCOPY (EGD) WITH PROPOFOL N/A Monitor Anesthesia Care  BIOPSY    EGD and COLON    SUBMUCOSAL TATTOO INJECTION    POLYPECTOMY     She underwent : on 06/12/2020 by Dr Ninfa Linden. LAPAROSCOPIC COLON RESECTION  She was diagnosed with Adenocarcinoma.   She was discharged home on 06/18/2020. Emotional support given.   Pain Inventory Average Pain 7 Pain Right Now 7 My pain is constant, sharp, burning, dull, stabbing, tingling and aching  In the last 24 hours, has pain interfered with the following? General activity 8 Relation with others 0 Enjoyment of life 5 What TIME of day is your pain at its worst? morning , daytime, evening and night Sleep (in general) Poor  Pain is worse with: inactivity and some activites Pain improves with: medication Relief from Meds:  varies  Family History  Problem Relation Age of Onset  . Other Mother        complications from flu  . Other Father        unsure of cause  . Colon cancer Neg Hx    Social History   Socioeconomic History  . Marital status: Widowed    Spouse name: Not on file  . Number of children: 3  . Years of education: 16 years  . Highest education level: Not on file  Occupational History  . Occupation: Retired  Tobacco Use  . Smoking status: Former Smoker    Packs/day: 0.50    Years: 20.00    Pack years: 10.00    Quit date: 02/03/1976    Years since quitting: 44.4  . Smokeless tobacco: Never Used  Vaping Use  . Vaping Use: Never used  Substance and Sexual Activity  . Alcohol use: No  . Drug use: No  . Sexual activity: Yes    Birth control/protection: Post-menopausal  Other Topics Concern  . Not on file  Social History Narrative   Lives at home with husband.   Right-handed.   No caffeine use.   Social Determinants of Health   Financial Resource Strain: Not on file  Food Insecurity: Not on file  Transportation Needs: Not on file  Physical Activity: Not on file  Stress: Not on file  Social Connections: Not on file   Past Surgical History:  Procedure Laterality Date  . [place] STUDY N/A 06/21/2018   Procedure: [place]  STUDY;  Surgeon: Lavena Bullion, DO;  [facility];  Service: Gastroenterology;  Laterality: N/A;  with impedance  . ABDOMINAL HYSTERECTOMY  2010  . ANAL RECTAL MANOMETRY N/A 09/19/2019   Procedure: ANO RECTAL MANOMETRY;  Surgeon: Mauri Pole, MD;  [facility];  Service: Endoscopy;  Laterality: N/A;  . APPENDECTOMY  1949  . [place]  . BIOPSY  06/10/2020   Procedure: BIOPSY;  Surgeon: Lavena Bullion, DO;  [facility];  Service: Gastroenterology;;  EGD and COLON  . BREAST SURGERY    . CATARACT EXTRACTION Bilateral 2011  . CHOLECYSTECTOMY    . COLON RESECTION N/A 06/12/2020   Procedure: LAPAROSCOPIC  COLON RESECTION;  Surgeon: [name], MD;  Location: [facility];  Service: General;  Laterality: N/A;  . COLONOSCOPY WITH PROPOFOL N/A 06/10/2020   Procedure: COLONOSCOPY WITH PROPOFOL;  Surgeon: Lavena Bullion, DO;  [facility];  Service: Gastroenterology;  Laterality: N/A;  . ESOPHAGEAL MANOMETRY N/A 06/21/2018   Procedure: ESOPHAGEAL MANOMETRY (EM);  Surgeon: Lavena Bullion, DO;  [facility];  Service: Gastroenterology;  Laterality: N/A;  . ESOPHAGOGASTRODUODENOSCOPY (EGD) WITH PROPOFOL N/A 06/10/2020   Procedure: ESOPHAGOGASTRODUODENOSCOPY (EGD) WITH PROPOFOL;  Surgeon: Lavena Bullion, DO;  [facility];  Service: Gastroenterology;  Laterality: N/A;  . GALLBLADDER SURGERY  2015  . JOINT REPLACEMENT     RT TOTAL HIP / RT TOTAL KNEE  . MASTECTOMY  1998   BILATERAL   . [place] IMPEDANCE STUDY  06/21/2018   Procedure: [place] IMPEDANCE STUDY;  Surgeon: Lavena Bullion, DO;  [facility];  Service: Gastroenterology;;  . POLYPECTOMY  06/10/2020   Procedure: POLYPECTOMY;  Surgeon: Lavena Bullion, DO;  [facility];  Service: Gastroenterology;;  . SKIN CANCER EXCISION  2016   RT SIDE OF NOSE  . SUBMUCOSAL TATTOO INJECTION  06/10/2020   Procedure: SUBMUCOSAL TATTOO INJECTION;  Surgeon: Lavena Bullion, DO;  [facility];  Service: Gastroenterology;;  . TONSILLECTOMY  1953  . TOTAL HIP ARTHROPLASTY  2010   RIGHT  . TOTAL HIP ARTHROPLASTY Left 05/09/2015   Procedure: LEFT TOTAL HIP ARTHROPLASTY ANTERIOR APPROACH;  Surgeon: Gaynelle Arabian, MD;  [facility];  Service: Orthopedics;  Laterality: Left;  . TOTAL KNEE ARTHROPLASTY  2001  . TUMOR REMOVAL  2012   ABDOMINAL - NON CANCEROUS   Past Surgical History:  Procedure Laterality Date  . [place] Lee STUDY N/A 06/21/2018   Procedure: [place] STUDY;  Surgeon: Lavena Bullion, DO;  [facility];  Service: Gastroenterology;  Laterality: N/A;  with impedance  .  ABDOMINAL HYSTERECTOMY  2010  . ANAL RECTAL MANOMETRY N/A 09/19/2019   Procedure: ANO RECTAL MANOMETRY;  Surgeon: Mauri Pole, MD;  [facility];  Service: Endoscopy;  Laterality: N/A;  . APPENDECTOMY  1949  . Crystal Lee  . BIOPSY  06/10/2020   Procedure: BIOPSY;  Surgeon: Lavena Bullion, DO;  [facility];  Service: Gastroenterology;;  EGD and COLON  . BREAST SURGERY    . CATARACT EXTRACTION Bilateral 2011  . CHOLECYSTECTOMY    . COLON RESECTION N/A 06/12/2020   Procedure: LAPAROSCOPIC COLON RESECTION;  Surgeon: [name], MD;  [facility];  Service: General;  Laterality: N/A;  . COLONOSCOPY WITH PROPOFOL N/A 06/10/2020   Procedure: COLONOSCOPY WITH PROPOFOL;  Surgeon: Lavena Bullion, DO;  [facility];  Service: Gastroenterology;  Laterality: N/A;  . ESOPHAGEAL MANOMETRY  N/A 06/21/2018   Procedure: ESOPHAGEAL MANOMETRY (EM);  Surgeon: Lavena Bullion, DO;  [facility];  Service: Gastroenterology;  Laterality: N/A;  . ESOPHAGOGASTRODUODENOSCOPY (EGD) WITH PROPOFOL N/A 06/10/2020   Procedure: ESOPHAGOGASTRODUODENOSCOPY (EGD) WITH PROPOFOL;  Surgeon: Lavena Bullion, DO;  [facility];  Service: Gastroenterology;  Laterality: N/A;  . GALLBLADDER SURGERY  2015  . JOINT REPLACEMENT     RT TOTAL HIP / RT TOTAL KNEE  . MASTECTOMY  1998   BILATERAL   . [place] IMPEDANCE STUDY  06/21/2018   Procedure: [place] IMPEDANCE STUDY;  Surgeon: Lavena Bullion, DO;  [facility];  Service: Gastroenterology;;  . POLYPECTOMY  06/10/2020   Procedure: POLYPECTOMY;  Surgeon: Lavena Bullion, DO;  [facility];  Service: Gastroenterology;;  . SKIN CANCER EXCISION  2016   RT SIDE OF NOSE  . SUBMUCOSAL TATTOO INJECTION  06/10/2020   Procedure: SUBMUCOSAL TATTOO INJECTION;  Surgeon: Lavena Bullion, DO;  [facility];  Service: Gastroenterology;;  . TONSILLECTOMY  1953  . TOTAL HIP ARTHROPLASTY   2010   RIGHT  . TOTAL HIP ARTHROPLASTY Left 05/09/2015   Procedure: LEFT TOTAL HIP ARTHROPLASTY ANTERIOR APPROACH;  Surgeon: Gaynelle Arabian, MD;  [facility];  Service: Orthopedics;  Laterality: Left;  . TOTAL KNEE ARTHROPLASTY  2001  . TUMOR REMOVAL  2012   ABDOMINAL - NON CANCEROUS   Past Medical History:  Diagnosis Date  . Arthritis   . Cancer (HCC)    HX BREAST CANCER/ SKIN CANCER  . Complication of anesthesia    N/V WITH MORPHINE  . Difficulty sleeping   . Fractured hip ([place])    LEFT - AUG 2016  . GERD (gastroesophageal reflux disease)   . Hyperlipidemia   . Hypertension   . Melanoma ([place])   . Neuropathy   . Nocturia   . Osteopenia   . 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 PONV (postoperative nausea and vomiting)    PT STATES MORPHINE CAUSED N/V  . Rosacea   . Sleep apnea   . Stage 1 breast cancer, ER+, right (HCC) 07/04/2017   BP 138/68   Pulse 81   Ht 5\' 6"  (1.676 m)   Wt 220 lb 14.4 oz (100.2 kg)   BMI 35.65 kg/m   Opioid Risk Score:   Fall Risk Score:  `1  Depression screen PHQ 2/9  Depression screen [facility] 2/9 05/20/2020 12/24/2019 11/22/2018 09/28/2018 06/02/2018 04/10/2018 02/20/2018  Decreased Interest 0 0 0 0 0 0 0  Down, Depressed, Hopeless 0 0 0 0 0 0 0  PHQ - 2 Score 0 0 0 0 0 0 0  Altered sleeping - - - - - - -  Tired, decreased energy - - - - - - -  Change in appetite - - - - - - -  Feeling bad or failure about yourself  - - - - - - -  Trouble concentrating - - - - - - -  Moving slowly or fidgety/restless - - - - - - -  Suicidal thoughts - - - - - - -  PHQ-9 Score - - - - - - -  Difficult doing work/chores - - - - - - -    Review of Systems  Constitutional: Negative.   HENT: Negative.   Eyes: Negative.   Respiratory: Negative.   Cardiovascular: Negative.   Gastrointestinal: Positive for anal bleeding, nausea and vomiting.  Genitourinary: Negative.   Musculoskeletal: Positive for arthralgias, back  pain, myalgias, neck pain and  neck stiffness.  Skin: Negative.   Allergic/Immunologic: Negative.   Neurological: Negative.   Hematological: Negative.   Psychiatric/Behavioral: Negative.   All other systems reviewed and are negative.      Objective:   Physical Exam Vitals and nursing note reviewed.  Musculoskeletal:     Comments: No Physical Exam Performed: Telephone Visit           Assessment & Plan:  1. Chronic Bilateral Leg pain: Paresthesia: Continue to Monitor.06/23/2020 2.Paresthesia /Lumbar Radiculitis:Ms. Harrellhasweaned herself off the Lyrica due to daytime drowsiness. We will continue to monitor.06/23/2020 3. Pain of Left Wrist/:No complaints today.S/P Carpal Tunnel Release on 06/03/2017 by Dr. Ellene Route. Dr. Ellene Route Following.06/23/2020. 4. Fracture of superior pubic ramus.Dr. AluisioFollowing.Continue to monitor.06/23/2020. 5.BilateralKnee OA: Continue Voltaren Gel.Continue to monitor. Orthopedist following.06/23/2020 6. Polyarthralgia: Continue to alternate with heat and ice therapy. Continue current medication regime. Continue to monitor.06/23/2020. 7. Chronic Pain Syndrome:Refilled::Hydrocodone10/325 mg one tablet every 6 hours as needed for moderate pain #120.06/23/2020. 8. Lumbar Compression Fracture L2 and L3: Ms. Coda refused physical therapy.Continue with rest/ heat therapy. Continue to Monitor 06/23/2020. 9. Muscle Spasm: Ms. Mccommon asked for refill of her Flexeril, she stated she took her last Flexeril last night, without any complications. ContinueFlexeril 5 mg at HS. Continue to monitor.06/23/2020.  F/U in 1 Month Telephone Call Established Patient Location of Patient: In her Home Location of Provider: In the office Total Time Spent: 20 Minutes Supervising Provider: Dr Naaman Plummer

## 2020-06-24 ENCOUNTER — Telehealth: Payer: Self-pay | Admitting: Hematology & Oncology

## 2020-06-24 NOTE — Telephone Encounter (Signed)
called and spoke with patient regarding appointments scheduled per 1/24 sch msg

## 2020-06-24 NOTE — Patient Instructions (Incomplete)
It was good to see you again today- I will be in touch with your labs asap  I am sorry that you are dealing with this cancer For constipation- add docusate sodium (stool softener) OTC once daily For nausea, try the phenergan INSTEAD of zofran- do not take both. This may cause drowsiness Try to drink fluids, eat soft, bland foods as tolerated  I agree that your upper incision looks a bit infected- treat with keflex twice a day for 5 days only  If you are not doing ok- getting worse or not able to drink fluids and eat at least some- please contact me  Please see me in 3-4 months to check on how you are doing

## 2020-06-25 ENCOUNTER — Encounter: Payer: Self-pay | Admitting: Family Medicine

## 2020-06-25 ENCOUNTER — Other Ambulatory Visit: Payer: Self-pay

## 2020-06-25 ENCOUNTER — Ambulatory Visit (INDEPENDENT_AMBULATORY_CARE_PROVIDER_SITE_OTHER): Payer: Medicare Other | Admitting: Family Medicine

## 2020-06-25 VITALS — BP 132/70 | HR 89 | Resp 15 | Ht 66.0 in | Wt 216.0 lb

## 2020-06-25 DIAGNOSIS — L03311 Cellulitis of abdominal wall: Secondary | ICD-10-CM

## 2020-06-25 DIAGNOSIS — K922 Gastrointestinal hemorrhage, unspecified: Secondary | ICD-10-CM

## 2020-06-25 DIAGNOSIS — Z09 Encounter for follow-up examination after completed treatment for conditions other than malignant neoplasm: Secondary | ICD-10-CM

## 2020-06-25 DIAGNOSIS — R110 Nausea: Secondary | ICD-10-CM | POA: Diagnosis not present

## 2020-06-25 MED ORDER — PROMETHAZINE HCL 12.5 MG PO TABS: 12.5000 mg | ORAL_TABLET | Freq: Four times a day (QID) | ORAL | 0 refills | Status: DC | PRN

## 2020-06-25 MED ORDER — CEPHALEXIN 500 MG PO CAPS: 500.0000 mg | ORAL_CAPSULE | Freq: Two times a day (BID) | ORAL | 0 refills | Status: DC

## 2020-06-26 ENCOUNTER — Encounter: Payer: Self-pay | Admitting: Family Medicine

## 2020-06-26 LAB — BASIC METABOLIC PANEL
BUN: 9 mg/dL (ref 6–23)
CO2: 30 mEq/L (ref 19–32)
Calcium: 9.2 mg/dL (ref 8.4–10.5)
Chloride: 103 mEq/L (ref 96–112)
Creatinine, Ser: 0.64 mg/dL (ref 0.40–1.20)
GFR: 80.99 mL/min (ref >60.00)
Glucose, Bld: 106 mg/dL — ABNORMAL HIGH (ref 70–99)
Potassium: 4.1 mEq/L (ref 3.5–5.1)
Sodium: 139 mEq/L (ref 135–145)

## 2020-06-26 LAB — CBC
HCT: 38.1 % (ref 36.0–46.0)
Hemoglobin: 12.4 g/dL (ref 12.0–15.0)
MCHC: 32.5 g/dL (ref 30.0–36.0)
MCV: 88.9 fl (ref 78.0–100.0)
Platelets: 254 10*3/uL (ref 150.0–400.0)
RBC: 4.29 Mil/uL (ref 3.87–5.11)
RDW: 15.6 % — ABNORMAL HIGH (ref 11.5–15.5)
WBC: 6.4 10*3/uL (ref 4.0–10.5)

## 2020-06-30 ENCOUNTER — Telehealth: Payer: Self-pay | Admitting: Family Medicine

## 2020-06-30 DIAGNOSIS — R300 Dysuria: Secondary | ICD-10-CM

## 2020-06-30 NOTE — Telephone Encounter (Signed)
This is ok, orders in

## 2020-06-30 NOTE — Telephone Encounter (Signed)
Left message to return call for patient to schedule lab appt.

## 2020-06-30 NOTE — Telephone Encounter (Signed)
Patient is calling in reference to a urinary complaint, patient would like to bring in a specimen today. Patient states frequent bathroom trips last night. Patient has a dr appointment this afternoon @ 2pm in anoth office

## 2020-06-30 NOTE — Telephone Encounter (Signed)
Ok to bring sample? If so please place appropriate order.

## 2020-07-07 ENCOUNTER — Ambulatory Visit (INDEPENDENT_AMBULATORY_CARE_PROVIDER_SITE_OTHER): Payer: Medicare Other | Admitting: Gastroenterology

## 2020-07-07 ENCOUNTER — Encounter: Payer: Self-pay | Admitting: Gastroenterology

## 2020-07-07 VITALS — BP 142/78 | HR 110 | Ht 66.0 in | Wt 218.0 lb

## 2020-07-07 DIAGNOSIS — C189 Malignant neoplasm of colon, unspecified: Secondary | ICD-10-CM | POA: Diagnosis not present

## 2020-07-07 DIAGNOSIS — R110 Nausea: Secondary | ICD-10-CM

## 2020-07-07 DIAGNOSIS — K921 Melena: Secondary | ICD-10-CM | POA: Diagnosis not present

## 2020-07-07 DIAGNOSIS — K5900 Constipation, unspecified: Secondary | ICD-10-CM

## 2020-07-07 MED ORDER — NALOXEGOL OXALATE 12.5 MG PO TABS: 12.5000 mg | ORAL_TABLET | Freq: Every day | ORAL | 3 refills | Status: DC

## 2020-07-07 MED ORDER — PROMETHAZINE HCL 12.5 MG PO TABS: 12.5000 mg | ORAL_TABLET | Freq: Four times a day (QID) | ORAL | 3 refills | Status: DC | PRN

## 2020-07-07 NOTE — Progress Notes (Signed)
P  Chief Complaint:    Colon cancer, hospital follow-up, constipation, hematochezia  GI History: 87 year old female follows in the GI clinic with the following:  1) Colon cancer: Diagnosed during hospital admission in 05/2020 for hematochezia and acute blood loss anemia. 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  CEA 6.8.  MMR normal.  2) GERD: Index sxs of belching along with NCCP.Has been present for over 20 years. Has been evaluated in ER several times for these sxs with negative cardiac w/u. Has trialed pantoprazole, omeprazole, esomeprazole, Zantac all without any improvement in sxs. No dysphagia or odynophagia. Sxs occur 2-3 times/week. Tried bland diet diet with some improvement. Worse with coffee, chocolate, spicy foods, tomato based sauces.  pH study normal in 2020.  3) Constipation: Long standing history of constipation. Had trialed Trulance in 2020.  Otherwise, had been taking Linzess periodically for years.    4) Elevated ALP: Was 130 in 04/2017 in the setting of active/recent pelvic fractures. Normal T bili. Previous peak of 146 in 05/2015, otherwise largely in the 80s. Repeat in 09/2018 was 156, then 121 10/2018. 5'nucleotidase elevated at 15 and elevated GGT at 123 in 10/2018.  MRCP 10/2018 with hepatic steatosis, stable lymphadenopathy, normal bile ducts.   Endoscopic history: -Colonoscopy (09/2015, [place], [place]): 2subcentimeter tubular adenomas. Recommended repeat in 5 years for ongoing surveillance. -EGD (03/2015, [place], [place]): Non-H. pylori gastritis, gastric polyp. -Esophageal Manometry (2020): Normal -pH/impedance (2020): Normal with JD score of 4 and pH <4was 0.8% (states that she avoided eating during the study due to gag reflex from probe in place) -Colonoscopy  (05/2020): 6 mm sigmoid polyp, mass at splenic flexure/proximal descending colon (biopsy: Adenocarcinoma), sigmoid diverticulosis -EGD (05/2020): Benign fundic gland polyps otherwise normal  HPI:     Patient is a 87 y.o. female presenting to the Gastroenterology Clinic for hospital follow-up.  She was hospitalized 1/9-19, 2022 with hematochezia and acute blood loss anemia.  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  Planning on follow-up with Dr. Marin Olp in the Oncology clinic with consideration of adjuvant therapy. Scheduled for 07/25/20. F/u appt with Dr. Ninfa Linden at [facility] tomorrow.   Today, she states constipation has been worse since getting out of hospital. Started docusate and Senna, without much response. Takes Norco 3-4 times/day for incisional pain along with pre-existing back/leg pains (was taking opioids prior to admission). Occasional BRB on tissue paper.  Presents with daughter today.   Review of systems:     No chest pain, no SOB, no fevers, no urinary sx   Past Medical History:  Diagnosis Date  . Arthritis   . Cancer (HCC)    HX BREAST CANCER/ SKIN CANCER  . Complication of anesthesia    N/V WITH MORPHINE  . Difficulty sleeping   . Fractured hip ([place])    LEFT - AUG 2016  . GERD (gastroesophageal reflux disease)   . Hyperlipidemia   . Hypertension   . Melanoma ([place])   . Neuropathy   . Nocturia   . Osteopenia   . 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 PONV (postoperative nausea and vomiting)    PT STATES MORPHINE CAUSED N/V  . Rosacea   . Sleep apnea   .  Stage 1 breast cancer, ER+, right ([place]) 07/04/2017    Patient's surgical history, family medical history, social history, medications and allergies were all reviewed in Epic    Current Outpatient Medications   Medication Sig Dispense Refill  . cyclobenzaprine (FLEXERIL) 5 MG tablet Take 1 tablet (5 mg total) by mouth at bedtime as needed for muscle spasms. 90 tablet 0  . fenofibrate (TRICOR) 48 MG tablet Take 1 tablet (48 mg total) by mouth daily. 90 tablet 3  . HYDROcodone-acetaminophen (NORCO) 10-325 MG tablet Take 1 tablet by mouth every 6 (six) hours as needed. 120 tablet 0  . losartan (COZAAR) 25 MG tablet Take 1 tablet (25 mg total) by mouth daily. 90 tablet 3  . Multiple Vitamins-Minerals (MULTIVITAMIN ADULTS) TABS Take 1 tablet by mouth daily.    . promethazine (PHENERGAN) 12.5 MG tablet Take 1 tablet (12.5 mg total) by mouth every 6 (six) hours as needed for nausea or vomiting. 30 tablet 0   No current facility-administered medications for this visit.    Physical Exam:     BP (!) 142/78 (BP Location: Left Arm, Patient Position: Sitting, Cuff Size: Large)   Pulse (!) 110   Ht $R'5\' 6"'To$  (1.676 m)   Wt 218 lb (98.9 kg)   BMI 35.19 kg/m   GENERAL:  Pleasant female in NAD PSYCH: : Cooperative, normal affect ABDOMEN: Well-healing midline incision.  Nondistended, soft, nontender. No obvious masses, no hepatomegaly,  normal bowel sounds SKIN:  turgor, no lesions seen Musculoskeletal:  Normal muscle tone, normal strength NEURO: Alert and oriented x 3, no focal neurologic deficits Rectal: Exam deferred by patient.   IMPRESSION and PLAN:    1) Colon Cancer -87 year old female with recently diagnosed stage IIIc colon cancer, s/p surgical resection.  Unfortunately, 3/14 LN positive. -Has follow-up scheduled with Dr. Marin Olp later this month to discuss potential adjuvant chemotherapy -Has follow-up with Dr. Ninfa Linden at [facility] tomorrow -Repeat colonoscopy in 1 year for short interval surveillance  2) Chronic constipation -Longstanding history of chronic constipation, and has trialed multiple medications in the past.  Suspect underlying constipation with superimposed OIC. - Start Movantik.  Start at 12.5 mg and can uptitrate to 25 mg pending effect - Hold Senna and Colace within 3 days of starting Movantik, Dr. Reintroduce if needed -Increase water intake  3) Hematochezia -Intermittent scant BRBPR on tissue paper with straining to have BM since surgery.  Discussed DDx, to include benign hemorrhoidal symptoms vs relationship with recent surgery.  Does have history of diverticulosis, but clinical presentation seems inconsistent with diverticular bleed.  Patient declined rectal exam/anoscopy today -Aggressive management of underlying constipation as above.  If symptoms persist, requested that she come back to clinic for exam  4) Nausea without emesis - Phenergan 12.5 mg prn Q6H, #90, RF3  RTC in 2-3 months or sooner as needed  I spent 35 minutes of time, including in depth chart review, independent review of results as outlined above, communicating results with the patient directly, face-to-face time with the patient, coordinating care, and ordering studies and medications as appropriate, and documentation.            [place] ,DO, FACG 07/07/2020, 2:12 PM

## 2020-07-07 NOTE — Patient Instructions (Signed)
If you are age 85 or older, your body mass index should be between 23-30. Your Body mass index is 35.19 kg/m. If this is out of the aforementioned range listed, please consider follow up with your Primary Care Provider.  If you are age 60 or younger, your body mass index should be between 19-25. Your Body mass index is 35.19 kg/m. If this is out of the aformentioned range listed, please consider follow up with your Primary Care Provider.   We have sent the following medications to your pharmacy for you to pick up at your convenience: Phenergan  Movantik  Follow up 3 months   It was a pleasure to see you today!  Vito Cirigliano, D.O.

## 2020-07-17 ENCOUNTER — Telehealth: Payer: Self-pay | Admitting: Hematology & Oncology

## 2020-07-17 ENCOUNTER — Ambulatory Visit: Payer: Medicare Other | Admitting: Family Medicine

## 2020-07-17 ENCOUNTER — Other Ambulatory Visit: Payer: Self-pay

## 2020-07-17 ENCOUNTER — Inpatient Hospital Stay: Payer: Medicare Other | Attending: Hematology & Oncology

## 2020-07-17 ENCOUNTER — Inpatient Hospital Stay (HOSPITAL_BASED_OUTPATIENT_CLINIC_OR_DEPARTMENT_OTHER): Payer: Medicare Other | Admitting: Hematology & Oncology

## 2020-07-17 ENCOUNTER — Encounter: Payer: Self-pay | Admitting: Hematology & Oncology

## 2020-07-17 ENCOUNTER — Encounter: Payer: Self-pay | Admitting: *Deleted

## 2020-07-17 VITALS — BP 133/75 | HR 78 | Temp 98.1°F | Resp 18 | Ht 66.0 in | Wt 218.0 lb

## 2020-07-17 DIAGNOSIS — Z23 Encounter for immunization: Secondary | ICD-10-CM | POA: Insufficient documentation

## 2020-07-17 DIAGNOSIS — Z170 Estrogen receptor positive status [ER+]: Secondary | ICD-10-CM

## 2020-07-17 DIAGNOSIS — C184 Malignant neoplasm of transverse colon: Secondary | ICD-10-CM | POA: Diagnosis not present

## 2020-07-17 DIAGNOSIS — Z853 Personal history of malignant neoplasm of breast: Secondary | ICD-10-CM | POA: Diagnosis not present

## 2020-07-17 DIAGNOSIS — C50911 Malignant neoplasm of unspecified site of right female breast: Secondary | ICD-10-CM

## 2020-07-17 DIAGNOSIS — C186 Malignant neoplasm of descending colon: Secondary | ICD-10-CM | POA: Diagnosis not present

## 2020-07-17 DIAGNOSIS — Z79899 Other long term (current) drug therapy: Secondary | ICD-10-CM | POA: Diagnosis not present

## 2020-07-17 LAB — LIPID PANEL
Cholesterol: 144 mg/dL (ref 0–200)
HDL: 33 mg/dL — ABNORMAL LOW (ref >40)
LDL Cholesterol: 85 mg/dL (ref 0–99)
Total CHOL/HDL Ratio: 4.4 RATIO
Triglycerides: 131 mg/dL (ref <150)
VLDL: 26 mg/dL (ref 0–40)

## 2020-07-17 LAB — CBC WITH DIFFERENTIAL (CANCER CENTER ONLY)
Abs Immature Granulocytes: 0.01 10*3/uL (ref 0.00–0.07)
Basophils Absolute: 0 10*3/uL (ref 0.0–0.1)
Basophils Relative: 1 %
Eosinophils Absolute: 0.3 10*3/uL (ref 0.0–0.5)
Eosinophils Relative: 4 %
HCT: 39.7 % (ref 36.0–46.0)
Hemoglobin: 12.8 g/dL (ref 12.0–15.0)
Immature Granulocytes: 0 %
Lymphocytes Relative: 38 %
Lymphs Abs: 2.4 10*3/uL (ref 0.7–4.0)
MCH: 29.5 pg (ref 26.0–34.0)
MCHC: 32.2 g/dL (ref 30.0–36.0)
MCV: 91.5 fL (ref 80.0–100.0)
Monocytes Absolute: 0.5 10*3/uL (ref 0.1–1.0)
Monocytes Relative: 8 %
Neutro Abs: 3 10*3/uL (ref 1.7–7.7)
Neutrophils Relative %: 49 %
Platelet Count: 125 10*3/uL — ABNORMAL LOW (ref 150–400)
RBC: 4.34 MIL/uL (ref 3.87–5.11)
RDW: 13.5 % (ref 11.5–15.5)
WBC Count: 6.2 10*3/uL (ref 4.0–10.5)
nRBC: 0 % (ref 0.0–0.2)

## 2020-07-17 LAB — CMP (CANCER CENTER ONLY)
ALT: 17 U/L (ref 0–44)
AST: 28 U/L (ref 15–41)
Albumin: 4.2 g/dL (ref 3.5–5.0)
Alkaline Phosphatase: 83 U/L (ref 38–126)
Anion gap: 7 (ref 5–15)
BUN: 11 mg/dL (ref 8–23)
CO2: 27 mmol/L (ref 22–32)
Calcium: 9.9 mg/dL (ref 8.9–10.3)
Chloride: 105 mmol/L (ref 98–111)
Creatinine: 0.66 mg/dL (ref 0.44–1.00)
GFR, Estimated: >60 mL/min (ref >60)
Glucose, Bld: 113 mg/dL — ABNORMAL HIGH (ref 70–99)
Potassium: 4.5 mmol/L (ref 3.5–5.1)
Sodium: 139 mmol/L (ref 135–145)
Total Bilirubin: 0.5 mg/dL (ref 0.3–1.2)
Total Protein: 7.3 g/dL (ref 6.5–8.1)

## 2020-07-17 MED ORDER — CAPECITABINE 500 MG PO TABS: 1000.0000 mg/m2 | ORAL_TABLET | Freq: Two times a day (BID) | ORAL | 5 refills | Status: DC

## 2020-07-17 MED ORDER — INFLUENZA VAC SPLIT QUAD 0.5 ML IM SUSY
PREFILLED_SYRINGE | INTRAMUSCULAR | Status: AC
  Filled 2020-07-17: qty 0.5

## 2020-07-17 MED ORDER — INFLUENZA VAC SPLIT QUAD 0.5 ML IM SUSY
0.5000 mL | PREFILLED_SYRINGE | Freq: Once | INTRAMUSCULAR | Status: AC
  Administered 2020-07-17: 0.5 mL via INTRAMUSCULAR

## 2020-07-17 NOTE — Telephone Encounter (Signed)
Appointments scheduled calendar printed per 2/17 los 

## 2020-07-17 NOTE — Progress Notes (Signed)
Hematology and Oncology Follow Up Visit  Crystal Lee [id number] 08-19-1933 87 y.o. 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 Year -- next dose on 11/2020   Interim History:  Crystal Lee is here today for follow-up.  Unfortunately, we now have a new problem.  She now has colon cancer.  She has stage IIIb colon cancer.  She presented with GI bleeding.  She was found to have a colonic mass.  This was biopsied and found to be adenocarcinoma.  She subsequently underwent a partial colectomy.  This was done on June 14, 2020.  The pathology report ([id number]) showed a 4.5 cm invasive adenocarcinoma.  All margins were negative.  She had involvement of the attached omentum.  She had 3/14 lymph nodes that were positive.  She had molecular testing.  The tumor was MMR proficient.  She recovered nicely.  She feels well.  She has had no problems with nausea or vomiting.  She had a lot of when she was discharged.  This is improved.  She has had no issues with bleeding.  There is no problems with her bowels or bladder.  She has had some constipation.  She has had no cough or shortness of breath.  She has had no leg swelling.  She does have back issues.  This may be a little bit better.  I talked to her at length about any adjuvant therapy.  I know that she is 87 years old.  This definitely complicates the situation.  She is healed up incredibly well from her surgery.  Overall, her performance status right now is [name]. .  Medications:  Allergies as of 07/17/2020      Reactions   Darvon [propoxyphene] Nausea And Vomiting, Palpitations   Darvocet Causes Sweats   Adhesive [tape]    Lisinopril  Cough   Lisinopril- cough   Morphine And Related Nausea And Vomiting      Medication List       Accurate as of July 17, 2020  1:53 PM. If you have any questions, ask your nurse or doctor.        cyclobenzaprine 5 MG tablet Commonly known as: FLEXERIL Take 1 tablet (5 mg total) by mouth at bedtime as needed for muscle spasms.   fenofibrate 48 MG tablet Commonly known as: Tricor Take 1 tablet (48 mg total) by mouth daily.   HYDROcodone-acetaminophen 10-325 MG tablet Commonly known as: NORCO Take 1 tablet by mouth every 6 (six) hours as needed.   losartan 25 MG tablet Commonly known as: COZAAR Take 1 tablet (25 mg total) by mouth daily.   Multivitamin Adults Tabs Take 1 tablet by mouth daily.   naloxegol oxalate 12.5 MG Tabs tablet Commonly known as: MOVANTIK Take 1 tablet (12.5 mg total) by mouth daily.   promethazine 12.5 MG tablet Commonly known as: PHENERGAN Take 1 tablet (12.5 mg total) by mouth every 6 (six) hours as needed for nausea or vomiting.       Allergies:  Allergies  Allergen Reactions  . Darvon [Propoxyphene] Nausea And Vomiting and Palpitations    Darvocet Causes Sweats  . Adhesive [Tape]   . Lisinopril Cough    Lisinopril- cough   . Morphine And Related Nausea And  Vomiting    Past Medical History, Surgical history, Social history, and Family History were reviewed and updated.  Review of Systems: Review of Systems  Constitutional: Negative.   HENT: Negative.   Eyes: Negative.   Respiratory: Negative.   Cardiovascular: Positive for leg swelling.  Gastrointestinal: Positive for constipation.  Genitourinary: Negative.   Musculoskeletal: Negative.   Skin: Negative.   Neurological: Negative.   Endo/Heme/Allergies: Negative.   Psychiatric/Behavioral: Negative.      Physical Exam:  height is _0  (1.676 m) and weight is 218 lb (98.9 kg). Her oral temperature is 98.1 F (36.7 C). Her blood pressure is 133/75 and her pulse is 78.  Her respiration is 18 and oxygen saturation is 98%.   Wt Readings from Last 3 Encounters:  07/17/20 218 lb (98.9 kg)  07/07/20 218 lb (98.9 kg)  06/25/20 216 lb (98 kg)    Physical Exam Vitals reviewed.  HENT:     Head: Normocephalic and atraumatic.  Eyes:     Pupils: Pupils are equal, round, and reactive to light.  Cardiovascular:     Rate and Rhythm: Normal rate and regular rhythm.     Heart sounds: Normal heart sounds.  Pulmonary:     Effort: Pulmonary effort is normal.     Breath sounds: Normal breath sounds.  Abdominal:     General: Bowel sounds are normal.     Palpations: Abdomen is soft.     Comments: Abdominal exam shows a soft abdomen.  She has well-healed laparoscopy scar.  She has no fluid wave.  There is no guarding or rebound tenderness.  There is no palpable liver or spleen tip.  Musculoskeletal:        General: No tenderness or deformity. Normal range of motion.     Cervical back: Normal range of motion.  Lymphadenopathy:     Cervical: No cervical adenopathy.  Skin:    General: Skin is warm and dry.     Findings: No erythema or rash.  Neurological:     Mental Status: She is alert and oriented to person, place, and time.  Psychiatric:        Behavior: Behavior normal.        Thought Content: Thought content normal.        Judgment: Judgment normal.   Or sooner for Crystal Lee is again Lab Results  Component Value Date   WBC 6.2 07/17/2020   HGB 12.8 07/17/2020   HCT 39.7 07/17/2020   MCV 91.5 07/17/2020   PLT 125 (L) 07/17/2020   Lab Results  Component Value Date   FERRITIN 45 06/09/2020   IRON 49 06/09/2020   TIBC 320 06/09/2020   UIBC 271 06/09/2020   IRONPCTSAT 15 06/09/2020   Lab Results  Component Value Date   RETICCTPCT 1.6 06/09/2020   RBC 4.34 07/17/2020   No results found for: [name], [facility] Lab Results  Component Value Date   IGGSERUM 1,085 02/09/2017   IGMSERUM 336 (H) 02/09/2017   No results found for:  Ronnald Ramp, A1GS, Nelida Meuse, SPEI   Chemistry      Component Value Date/Time   NA 139 07/17/2020 1146   NA 139 02/25/2020 1627   K 4.5 07/17/2020 1146   CL 105 07/17/2020 1146   CO2 27 07/17/2020 1146   BUN 11 07/17/2020 1146   BUN 11 02/25/2020 1627   CREATININE 0.66 07/17/2020 1146      Component Value Date/Time   CALCIUM 9.9 07/17/2020 1146  ALKPHOS 83 07/17/2020 1146   AST 28 07/17/2020 1146   ALT 17 07/17/2020 1146   BILITOT 0.5 07/17/2020 1146       Impression and Plan: Ms. Constantine is a very pleasant 87 yo caucasian female with history of bilateral stage Ia breast cancer diagnosed back in 1998. She now has a new problem with respect to a locally advanced colon cancer.  I know this is controversial. She is 87 years old. As such, we really have to keep in mind the benefits of adjuvant therapy versus the risk.  Given that she has 3+ lymph nodes, I do think that it would be worthwhile to have her on adjuvant therapy. I would just treat her with Xeloda. I think this would be a reasonable option for her.  I talked to her about Xeloda. I went over the side effects of Xeloda with respect to diarrhea, sun sensitivity, palmar/plantar erythema, possible mouth sores. I think that she would be able to handle this. She is not going to get full dose Xeloda.  She is in agreement. She wants to be aggressive.  I think we can do 6 cycles of treatment with Xeloda.  We will have to get this all arranged. Hopefully we can get the Xeloda started in a week or 2.  I will plan to see her back in about a month.  I spent a good 45 minutes with her today talking to her about Xeloda and the use of adjuvant therapy for colon cancer.   [name], MD 2/17/20221:53 PM

## 2020-07-18 ENCOUNTER — Telehealth: Payer: Self-pay | Admitting: Pharmacy Technician

## 2020-07-18 ENCOUNTER — Other Ambulatory Visit: Payer: Self-pay | Admitting: Hematology & Oncology

## 2020-07-18 ENCOUNTER — Telehealth: Payer: Self-pay | Admitting: Pharmacist

## 2020-07-18 ENCOUNTER — Encounter: Payer: Self-pay | Admitting: *Deleted

## 2020-07-18 ENCOUNTER — Telehealth: Payer: Self-pay

## 2020-07-18 DIAGNOSIS — B0052 Herpesviral keratitis: Secondary | ICD-10-CM | POA: Diagnosis not present

## 2020-07-18 DIAGNOSIS — C186 Malignant neoplasm of descending colon: Secondary | ICD-10-CM

## 2020-07-18 LAB — CANCER ANTIGEN 27.29: CA 27.29: 20.6 U/mL (ref 0.0–38.6)

## 2020-07-18 MED ORDER — CAPECITABINE 500 MG PO TABS
1000.0000 mg/m2 | ORAL_TABLET | Freq: Two times a day (BID) | ORAL | 5 refills | Status: DC
Start: 2020-07-18 — End: 2020-07-18

## 2020-07-18 MED FILL — CAPECITABINE 500 MG TABLET: 500 | 14 days supply | Qty: 112 | Fill #0

## 2020-07-18 NOTE — Telephone Encounter (Signed)
Oral Oncology Patient Advocate Encounter  I spoke with Crystal Lee this afternoon to set up delivery of Xeloda.  Address verified for shipment.  Xeloda will be filled through [facility] and mailed 07/18/20 for delivery 2/19 or 2/21.    [place] will call 7-10 days before next refill is due to complete adherence call and set up delivery of medication.     [place] Patient [place] Phone [phone] Fax [phone] 07/18/2020 2:03 PM

## 2020-07-18 NOTE — Telephone Encounter (Signed)
Oral Oncology Pharmacist Encounter  Received new prescription for Xeloda (capecitabine) for the adjuvant treatment of stage IIIB colon cancer, planned duration of 6 cycles. Due to concerns about tolerability, MD is using capecitabine as monotherapy. Planned start 07/29/20  CMP from 07/17/20 assessed, no relevant lab abnormalities. Prescription dose and frequency assessed.   Current medication list in Epic reviewed, no DDIs with capecitabine identified.  Evaluated chart and no patient barriers to medication adherence identified.   Prescription has been e-scribed to the [facility] for benefits analysis and approval.  Oral Oncology Clinic will continue to follow for insurance authorization, copayment issues, initial counseling and start date.  Darl Pikes, PharmD, BCPS, BCOP, CPP Hematology/Oncology Clinical Pharmacist Practitioner [facility] [phone]  07/18/2020 9:31 AM

## 2020-07-18 NOTE — Telephone Encounter (Signed)
Oral Chemotherapy Pharmacist Encounter  Patient Education I spoke with patient for overview of new oral chemotherapy medication: Xeloda (capecitabine) for the adjuvant treatment of stage IIIB colon cancer, planned duration of 6 cycles. Due to concerns about tolerability, MD is using capecitabine as monotherapy. Planned start 07/22/20.   Pt is doing well. Counseled patient on administration, dosing, side effects, monitoring, drug-food interactions, safe handling, storage, and disposal. Patient will take 4 tablets (2,000 mg total) by mouth 2 (two) times daily after a meal. Take for 14 days, then hold for 7 days. Repeat every 21 days.  Side effects include but not limited to: diarrhea, N/V, fatigue, hand-foot syndrome, edema, decreased wbc.    Reviewed with patient importance of keeping a medication schedule and plan for any missed doses.  After discussion with patient no patient barriers to medication adherence identified.   Crystal Lee voiced understanding and appreciation. All questions answered. Medication handout and calendar provided.  Provided patient with [facility] phone number. Patient knows to call the office with questions or concerns. Oral Chemotherapy Navigation Clinic will continue to follow.  Patient agreed to treatment on 07/18/20, per MD documentation.  Darl Pikes, PharmD, BCPS, BCOP, CPP Hematology/Oncology Clinical Pharmacist Practitioner [facility] [phone]  07/18/2020 2:04 PM

## 2020-07-18 NOTE — Telephone Encounter (Signed)
Oral Oncology Patient Advocate Encounter  After completing a benefits investigation, prior authorization for Xeloda is not required at this time through [place].  Patient's copay is $14.00.  [place] Patient [place] Phone [phone] Fax [phone] 07/18/2020 9:35 AM

## 2020-07-18 NOTE — Progress Notes (Signed)
Xeloda has been approved and will be sent to patient early next week. Alyson reached out to the office since follow up is so late in March. Spoke to Dr Marin Olp and we will have patient come back for assessment mid-March instead. Message sent to scheduling.   Oncology Nurse Navigator Documentation  Oncology Nurse Navigator Flowsheets 07/18/2020  Confirmed Diagnosis Date -  Diagnosis Status -  Planned Course of Treatment -  Phase of Treatment -  Chemotherapy Pending- Reason: -  Navigator Follow Up Date: 08/13/2020  Navigator Follow Up Reason: Follow-up Appointment  Navigator Location [facility]  Navigator Encounter Type Telephone  Telephone Incoming Call;Medication Assistance  Patient Visit Type MedOnc  Treatment Phase Pre-Tx/Tx Discussion  Barriers/Navigation Needs Coordination of Care;Education;Anxiety;Transportation  Education -  Interventions Coordination of Care  Acuity Level 3-Moderate Needs (3-4 Barriers Identified)  Coordination of Care Appts  Education Method -  Time Spent with Patient 30

## 2020-07-18 NOTE — Telephone Encounter (Signed)
Per 07/18/20 sch message please move 08/25/20 appt to sooner, called and lm with new appt times            Crystal Lee

## 2020-07-18 NOTE — Progress Notes (Signed)
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 Hanley Seamen patient "Your Patient Navigator" handout which explains my role, areas in which I am able to help, and all the contact information for myself and the office. Also gave patient MD and Navigator business card. Reviewed with patient the general overview of expected course after initial diagnosis and time frame for all steps to be completed.  New patient packet given to patient which includes: orientation to office and staff; campus directory; education on My Chart and Advance Directives; and patient centered education on colorectal cancer.   Patient completed visit with Dr. Marin Olp. She will begin Xeloda therapy and prescription was sent to [facility] [place].   Patient has a very limited support system. She has a child who is working on moving back to the area, but states this won't happen until this summer. She states she has no other friends who can help with transportation or support.   Patient understands all follow up procedures and expectations. They have my number to reach out for any further clarification or additional needs.   Oncology Nurse Navigator Documentation  Oncology Nurse Navigator Flowsheets 07/17/2020  Confirmed Diagnosis Date 06/12/2020  Diagnosis Status Confirmed Diagnosis Complete  Planned Course of Treatment Chemotherapy  Phase of Treatment Chemo  Chemotherapy Pending- Reason: Insurance Authorization  Navigator Follow Up Date: 07/22/2020  Navigator Follow Up Reason: Other:  Navigator Location [facility]  Navigator Encounter Type Initial MedOnc  Patient Visit Type MedOnc  Treatment Phase Pre-Tx/Tx Discussion  Barriers/Navigation Needs Coordination of Care;Education;Anxiety;Transportation  Education Other  Interventions Coordination of  Care;Education;Psycho-Social Support  Acuity Level 3-Moderate Needs (3-4 Barriers Identified)  Education Method Verbal;Written  Time Spent with Patient 86

## 2020-07-21 ENCOUNTER — Other Ambulatory Visit: Payer: Self-pay

## 2020-07-21 ENCOUNTER — Encounter: Payer: Medicare Other | Attending: Physical Medicine & Rehabilitation | Admitting: Registered Nurse

## 2020-07-21 ENCOUNTER — Encounter: Payer: Self-pay | Admitting: Registered Nurse

## 2020-07-21 VITALS — BP 169/91 | HR 90 | Ht 66.0 in | Wt 218.0 lb

## 2020-07-21 DIAGNOSIS — M7062 Trochanteric bursitis, left hip: Secondary | ICD-10-CM | POA: Diagnosis not present

## 2020-07-21 DIAGNOSIS — G894 Chronic pain syndrome: Secondary | ICD-10-CM | POA: Diagnosis not present

## 2020-07-21 DIAGNOSIS — M7061 Trochanteric bursitis, right hip: Secondary | ICD-10-CM | POA: Insufficient documentation

## 2020-07-21 DIAGNOSIS — R202 Paresthesia of skin: Secondary | ICD-10-CM | POA: Diagnosis not present

## 2020-07-21 DIAGNOSIS — M170 Bilateral primary osteoarthritis of knee: Secondary | ICD-10-CM | POA: Diagnosis not present

## 2020-07-21 DIAGNOSIS — M2550 Pain in unspecified joint: Secondary | ICD-10-CM | POA: Insufficient documentation

## 2020-07-21 DIAGNOSIS — S32000D Wedge compression fracture of unspecified lumbar vertebra, subsequent encounter for fracture with routine healing: Secondary | ICD-10-CM

## 2020-07-21 DIAGNOSIS — S32000S Wedge compression fracture of unspecified lumbar vertebra, sequela: Secondary | ICD-10-CM | POA: Insufficient documentation

## 2020-07-21 DIAGNOSIS — Z79899 Other long term (current) drug therapy: Secondary | ICD-10-CM | POA: Insufficient documentation

## 2020-07-21 DIAGNOSIS — H1820 Unspecified corneal edema: Secondary | ICD-10-CM | POA: Diagnosis not present

## 2020-07-21 DIAGNOSIS — Z5181 Encounter for therapeutic drug level monitoring: Secondary | ICD-10-CM | POA: Diagnosis not present

## 2020-07-21 DIAGNOSIS — B0052 Herpesviral keratitis: Secondary | ICD-10-CM | POA: Diagnosis not present

## 2020-07-21 DIAGNOSIS — M961 Postlaminectomy syndrome, not elsewhere classified: Secondary | ICD-10-CM | POA: Insufficient documentation

## 2020-07-21 MED ORDER — HYDROCODONE-ACETAMINOPHEN 10-325 MG PO TABS: 1.0000 | ORAL_TABLET | Freq: Four times a day (QID) | ORAL | 0 refills | Status: DC | PRN

## 2020-07-21 NOTE — Progress Notes (Signed)
Subjective:    Patient ID: Crystal Lee, female    DOB: [birth year], 87 y.o.   MRN: [id number]  HPI: Crystal Lee is a 87 y.o. female whose appointment is a telephone call today, she reports, she will begin chemotherapy on 07/22/2020, oncology following.  She states her pain is located in her lower back, bilateral lower extremities and reports generalized joint pain all over. She rates her pain 6. Her current exercise regime is walking in her home with walker.   Crystal Lee states "her chemotherapy will be for two weeks, off one week then on for two weeks for 6 months or longer".   Crystal Lee Morphine equivalent is 40.00 MME.  Last Oral Swab was Performed by 11/22/2019, it was consistent.    Pain Inventory Average Pain 6 Pain Right Now 6 My pain is intermittent, constant, dull and aching  In the last 24 hours, has pain interfered with the following? General activity 6 Relation with others 6 Enjoyment of life 6 What TIME of day is your pain at its worst? evening and night Sleep (in general) Fair  Pain is worse with: standing Pain improves with: rest, heat/ice and medication Relief from Meds: 5  Family History  Problem Relation Age of Onset  . Other Mother        complications from flu  . Other Father        unsure of cause  . Colon cancer Neg Hx    Social History   Socioeconomic History  . Marital status: Widowed    Spouse name: Not on file  . Number of children: 3  . Years of education: 16 years  . Highest education level: Not on file  Occupational History  . Occupation: Retired  Tobacco Use  . Smoking status: Former Smoker    Packs/day: 0.50    Years: 20.00    Pack years: 10.00    Quit date: 02/03/1976    Years since quitting: 44.4  . Smokeless tobacco: Never Used  Vaping Use  . Vaping Use: Never used  Substance and Sexual Activity  . Alcohol use: No  . Drug use: No  . Sexual activity: Yes    Birth control/protection: Post-menopausal  Other Topics Concern   . Not on file  Social History Narrative   Lives at home with husband.   Right-handed.   No caffeine use.   Social Determinants of Health   Financial Resource Strain: Not on file  Food Insecurity: Not on file  Transportation Needs: Not on file  Physical Activity: Not on file  Stress: Not on file  Social Connections: Not on file   Past Surgical History:  Procedure Laterality Date  . [place] STUDY N/A 06/21/2018   Procedure: [place] STUDY;  Surgeon: Lavena Bullion, DO;  Location: [facility];  Service: Gastroenterology;  Laterality: N/A;  with impedance  . ABDOMINAL HYSTERECTOMY  2010  . ANAL RECTAL MANOMETRY N/A 09/19/2019   Procedure: ANO RECTAL MANOMETRY;  Surgeon: Mauri Pole, MD;  [facility]: [facility];  Service: Endoscopy;  Laterality: N/A;  . APPENDECTOMY  1949  . [place]  . BIOPSY  06/10/2020   Procedure: BIOPSY;  Surgeon: Lavena Bullion, DO;  [facility];  Service: Gastroenterology;;  EGD and COLON  . BREAST SURGERY    . CATARACT EXTRACTION Bilateral 2011  . CHOLECYSTECTOMY    . COLON RESECTION N/A 06/12/2020   Procedure: LAPAROSCOPIC COLON RESECTION;  Surgeon: [name], MD;  [facility]: [facility];  Service: General;  Laterality: N/A;  . COLONOSCOPY WITH PROPOFOL N/A 06/10/2020   Procedure: COLONOSCOPY WITH PROPOFOL;  Surgeon: Lavena Bullion, DO;  [facility];  Service: Gastroenterology;  Laterality: N/A;  . ESOPHAGEAL MANOMETRY N/A 06/21/2018   Procedure: ESOPHAGEAL MANOMETRY (EM);  Surgeon: Lavena Bullion, DO;  [facility];  Service: Gastroenterology;  Laterality: N/A;  . ESOPHAGOGASTRODUODENOSCOPY (EGD) WITH PROPOFOL N/A 06/10/2020   Procedure: ESOPHAGOGASTRODUODENOSCOPY (EGD) WITH PROPOFOL;  Surgeon: Lavena Bullion, DO;  [facility];  Service: Gastroenterology;  Laterality: N/A;  . GALLBLADDER SURGERY  2015  . JOINT REPLACEMENT     RT TOTAL HIP / RT TOTAL KNEE  . MASTECTOMY   1998   BILATERAL   . [place] IMPEDANCE STUDY  06/21/2018   Procedure: [place] IMPEDANCE STUDY;  Surgeon: Lavena Bullion, DO;  [facility];  Service: Gastroenterology;;  . POLYPECTOMY  06/10/2020   Procedure: POLYPECTOMY;  Surgeon: Lavena Bullion, DO;  [facility];  Service: Gastroenterology;;  . SKIN CANCER EXCISION  2016   RT SIDE OF NOSE  . SUBMUCOSAL TATTOO INJECTION  06/10/2020   Procedure: SUBMUCOSAL TATTOO INJECTION;  Surgeon: Lavena Bullion, DO;  [facility];  Service: Gastroenterology;;  . TONSILLECTOMY  1953  . TOTAL HIP ARTHROPLASTY  2010   RIGHT  . TOTAL HIP ARTHROPLASTY Left 05/09/2015   Procedure: LEFT TOTAL HIP ARTHROPLASTY ANTERIOR APPROACH;  Surgeon: Gaynelle Arabian, MD;  [facility];  Service: Orthopedics;  Laterality: Left;  . TOTAL KNEE ARTHROPLASTY  2001  . TUMOR REMOVAL  2012   ABDOMINAL - NON CANCEROUS   Past Surgical History:  Procedure Laterality Date  . [place] STUDY N/A 06/21/2018   Procedure: [place] STUDY;  Surgeon: Lavena Bullion, DO;  [facility];  Service: Gastroenterology;  Laterality: N/A;  with impedance  . ABDOMINAL HYSTERECTOMY  2010  . ANAL RECTAL MANOMETRY N/A 09/19/2019   Procedure: ANO RECTAL MANOMETRY;  Surgeon: Mauri Pole, MD;  [facility];  Service: Endoscopy;  Laterality: N/A;  . APPENDECTOMY  1949  . [place]  . BIOPSY  06/10/2020   Procedure: BIOPSY;  Surgeon: Lavena Bullion, DO;  [facility];  Service: Gastroenterology;;  EGD and COLON  . BREAST SURGERY    . CATARACT EXTRACTION Bilateral 2011  . CHOLECYSTECTOMY    . COLON RESECTION N/A 06/12/2020   Procedure: LAPAROSCOPIC COLON RESECTION;  Surgeon: [name], MD;  [facility];  Service: General;  Laterality: N/A;  . COLONOSCOPY WITH PROPOFOL N/A 06/10/2020   Procedure: COLONOSCOPY WITH PROPOFOL;  Surgeon: Lavena Bullion, DO;  [facility];  Service:  Gastroenterology;  Laterality: N/A;  . ESOPHAGEAL MANOMETRY N/A 06/21/2018   Procedure: ESOPHAGEAL MANOMETRY (EM);  Surgeon: Lavena Bullion, DO;  [facility];  Service: Gastroenterology;  Laterality: N/A;  . ESOPHAGOGASTRODUODENOSCOPY (EGD) WITH PROPOFOL N/A 06/10/2020   Procedure: ESOPHAGOGASTRODUODENOSCOPY (EGD) WITH PROPOFOL;  Surgeon: Lavena Bullion, DO;  [facility];  Service: Gastroenterology;  Laterality: N/A;  . GALLBLADDER SURGERY  2015  . JOINT REPLACEMENT     RT TOTAL HIP / RT TOTAL KNEE  . MASTECTOMY  1998   BILATERAL   . [place] IMPEDANCE STUDY  06/21/2018   Procedure: [place] IMPEDANCE STUDY;  Surgeon: Lavena Bullion, DO;  [facility];  Service: Gastroenterology;;  . POLYPECTOMY  06/10/2020   Procedure: POLYPECTOMY;  Surgeon: Lavena Bullion, DO;  [facility];  Service: Gastroenterology;;  . SKIN CANCER EXCISION  2016   RT SIDE OF NOSE  . SUBMUCOSAL TATTOO INJECTION  06/10/2020   Procedure: SUBMUCOSAL TATTOO INJECTION;  Surgeon: Lavena Bullion, DO;  [facility];  Service: Gastroenterology;;  . TONSILLECTOMY  1953  . TOTAL HIP ARTHROPLASTY  2010   RIGHT  . TOTAL HIP ARTHROPLASTY Left 05/09/2015   Procedure: LEFT TOTAL HIP ARTHROPLASTY ANTERIOR APPROACH;  Surgeon: Gaynelle Arabian, MD;  [facility];  Service: Orthopedics;  Laterality: Left;  . TOTAL KNEE ARTHROPLASTY  2001  . TUMOR REMOVAL  2012   ABDOMINAL - NON CANCEROUS   Past Medical History:  Diagnosis Date  . Arthritis   . Cancer (HCC)    HX BREAST CANCER/ SKIN CANCER  . Complication of anesthesia    N/V WITH MORPHINE  . Difficulty sleeping   . Fractured hip ([place])    LEFT - AUG 2016  . GERD (gastroesophageal reflux disease)   . Hyperlipidemia   . Hypertension   . Melanoma ([place])   . Neuropathy   . Nocturia   . Osteopenia   . 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 PONV (postoperative nausea and vomiting)    PT STATES MORPHINE CAUSED  N/V  . Rosacea   . Sleep apnea   . Stage 1 breast cancer, ER+, right ([place]) 07/04/2017   BP (!) 169/91 Comment: pt reported, virtual visit  Pulse 90 Comment: pt reported, virtual visit  Ht 5\' 6"  (1.676 m)   Wt 218 lb (98.9 kg)   BMI 35.19 kg/m   Opioid Risk Score:   Fall Risk Score:  `1  Depression screen PHQ 2/9  Depression screen [facility] 2/9 05/20/2020 12/24/2019 11/22/2018 09/28/2018 06/02/2018 04/10/2018 02/20/2018  Decreased Interest 0 0 0 0 0 0 0  Down, Depressed, Hopeless 0 0 0 0 0 0 0  PHQ - 2 Score 0 0 0 0 0 0 0  Altered sleeping - - - - - - -  Tired, decreased energy - - - - - - -  Change in appetite - - - - - - -  Feeling bad or failure about yourself  - - - - - - -  Trouble concentrating - - - - - - -  Moving slowly or fidgety/restless - - - - - - -  Suicidal thoughts - - - - - - -  PHQ-9 Score - - - - - - -  Difficult doing work/chores - - - - - - -   Review of Systems  Musculoskeletal: Positive for gait problem and myalgias.  Neurological: Positive for weakness.  All other systems reviewed and are negative.      Objective:   Physical Exam Vitals and nursing note reviewed.  Musculoskeletal:     Comments: No Physical Exam Performed: Telephone Visit  Psychiatric:        Mood and Affect: Mood normal.        Behavior: Behavior normal.           Assessment & Plan:  1. Chronic Bilateral Leg pain: Paresthesia: Continue to Monitor.07/21/2020 2.Paresthesia /Lumbar Radiculitis:Ms. Harrellhasweaned herself off the Lyrica due to daytime drowsiness. We will continue to monitor.07/21/2020 3. Pain of Left Wrist/:No complaints today.S/P Carpal Tunnel Release on 06/03/2017 by Dr. Ellene Route. Dr. Ellene Route Following.07/21/2020. 4. Fracture of superior pubic ramus.Dr. AluisioFollowing.Continue to monitor.07/21/2020. 5.BilateralKnee OA: Continue Voltaren Gel.Continue to monitor. Orthopedist following.07/21/2020 6. Polyarthralgia: Continue to alternate with heat and  ice therapy.  Continue current medication regime. Continue to monitor.07/21/2020. 7. Chronic Pain Syndrome:Refilled::Hydrocodone10/325 mg one tablet every 6 hours as needed for moderate pain #120.07/21/2020. 8. Lumbar Compression Fracture L2 and L3: Ms. Orrison refused physical therapy.Continue with rest/ heat therapy. Continue to Monitor 07/21/2020. 9. Muscle Spasm: ContinueFlexeril 5 mg at HS. Continue to monitor.07/21/2020.  F/U in 1 Month Telephone Call Established Patient Location of Patient: In her Home Location of Provider: In the office Total Time Spent: 15 Minutes Supervising Provider: Dr Naaman Plummer

## 2020-07-22 ENCOUNTER — Encounter: Payer: Self-pay | Admitting: *Deleted

## 2020-07-22 NOTE — Progress Notes (Signed)
Patient has her Xeloda and wants to make sure she is supposed to start taking it today. Confirmed with her to start taking today. She then asked questions about side effects and what to expect. Side effects were reviewed with patient, but explained that each person responds differently, and we won't know exactly how she'll tolerate. Asked her to let us know if she experiences any side effects so that we can help with interventions. She agreed.  Patient then asked where to get the [place] Smooth cream for her hands and feet. Checked their website and informed patient that she should be able to find it at any of our local pharmacies.   Oncology Nurse Navigator Documentation  Oncology Nurse Navigator Flowsheets 07/22/2020  Confirmed Diagnosis Date -  Diagnosis Status -  Planned Course of Treatment -  Phase of Treatment Chemo  Chemotherapy Pending- Reason: -  Chemotherapy Actual Start Date: 07/22/2020  Navigator Follow Up Date: 08/13/2020  Navigator Follow Up Reason: Follow-up Appointment  Navigator Location [facility]  Navigator Encounter Type Telephone  Telephone Incoming Call;Medication Assistance;Education  Treatment Initiated Date 07/22/2020  Patient Visit Type MedOnc  Treatment Phase Pre-Tx/Tx Discussion  Barriers/Navigation Needs Coordination of Care;Education;Anxiety;Transportation  International aid/development worker for Biomedical scientist Treatment  Interventions Education;Medication Assistance;Psycho-Social Support  Acuity Level 3-Moderate Needs (3-4 Barriers Identified)  Coordination of Care -  Education Method Verbal;Teach-back  Time Spent with Patient 30

## 2020-07-25 ENCOUNTER — Ambulatory Visit: Payer: Medicare Other | Admitting: Hematology & Oncology

## 2020-07-25 ENCOUNTER — Other Ambulatory Visit: Payer: Medicare Other

## 2020-07-25 DIAGNOSIS — H1820 Unspecified corneal edema: Secondary | ICD-10-CM | POA: Diagnosis not present

## 2020-08-06 ENCOUNTER — Encounter: Payer: Self-pay | Admitting: *Deleted

## 2020-08-06 NOTE — Progress Notes (Signed)
Patient calling with some questions regarding her Xeloda.  She finished her first two weeks yesterday. She is ready for a refill. She states she tolerated her first cycle fairly well. She did report nausea and some bone pain. She didn't use the antiemetics for nausea control as she states she would lay down and the nausea would go away. She asks about hair loss and I explained that hair loss was unlikely, but she could experience some hair thinning.   She is due to start her next cycle on Tuesday, but her follow up with [place] is on Wednesday. I instructed her to delay the start of her next cycle until she is seen and assessed by Dr Marin Olp on Wednesday. She confirmed this direction.   Message sent to [facility] to notify them of patient's need for refill.   Oncology Nurse Navigator Documentation  Oncology Nurse Navigator Flowsheets 08/06/2020  Confirmed Diagnosis Date -  Diagnosis Status -  Planned Course of Treatment -  Phase of Treatment -  Chemotherapy Pending- Reason: -  Chemotherapy Actual Start Date: -  Navigator Follow Up Date: 08/13/2020  Navigator Follow Up Reason: Follow-up Appointment  Navigator Location [facility]  Navigator Encounter Type Telephone  Telephone Education;Medication Assistance;Incoming Call  Treatment Initiated Date -  Patient Visit Type MedOnc  Treatment Phase Active Tx  Barriers/Navigation Needs Coordination of Care;Education;Anxiety;Transportation  Education Pain/ Symptom Management  Interventions Education;Medication Assistance;Psycho-Social Support  Acuity Level 3-Moderate Needs (3-4 Barriers Identified)  Coordination of Care -  Education Method Verbal;Teach-back  Time Spent with Patient 30

## 2020-08-07 DIAGNOSIS — H1820 Unspecified corneal edema: Secondary | ICD-10-CM | POA: Diagnosis not present

## 2020-08-07 DIAGNOSIS — L821 Other seborrheic keratosis: Secondary | ICD-10-CM | POA: Diagnosis not present

## 2020-08-07 DIAGNOSIS — Z85828 Personal history of other malignant neoplasm of skin: Secondary | ICD-10-CM | POA: Diagnosis not present

## 2020-08-07 DIAGNOSIS — D2272 Melanocytic nevi of left lower limb, including hip: Secondary | ICD-10-CM | POA: Diagnosis not present

## 2020-08-07 DIAGNOSIS — L905 Scar conditions and fibrosis of skin: Secondary | ICD-10-CM | POA: Diagnosis not present

## 2020-08-07 DIAGNOSIS — D2271 Melanocytic nevi of right lower limb, including hip: Secondary | ICD-10-CM | POA: Diagnosis not present

## 2020-08-07 MED FILL — CAPECITABINE 500 MG TABLET: 500 | 21 days supply | Qty: 112 | Fill #1

## 2020-08-08 DIAGNOSIS — Z9889 Other specified postprocedural states: Secondary | ICD-10-CM | POA: Insufficient documentation

## 2020-08-08 DIAGNOSIS — R351 Nocturia: Secondary | ICD-10-CM | POA: Insufficient documentation

## 2020-08-08 DIAGNOSIS — C801 Malignant (primary) neoplasm, unspecified: Secondary | ICD-10-CM | POA: Insufficient documentation

## 2020-08-08 DIAGNOSIS — G4730 Sleep apnea, unspecified: Secondary | ICD-10-CM | POA: Insufficient documentation

## 2020-08-08 DIAGNOSIS — G479 Sleep disorder, unspecified: Secondary | ICD-10-CM | POA: Insufficient documentation

## 2020-08-08 DIAGNOSIS — R112 Nausea with vomiting, unspecified: Secondary | ICD-10-CM | POA: Insufficient documentation

## 2020-08-08 DIAGNOSIS — M1990 Unspecified osteoarthritis, unspecified site: Secondary | ICD-10-CM | POA: Insufficient documentation

## 2020-08-08 DIAGNOSIS — T8859XA Other complications of anesthesia, initial encounter: Secondary | ICD-10-CM | POA: Insufficient documentation

## 2020-08-08 DIAGNOSIS — L719 Rosacea, unspecified: Secondary | ICD-10-CM | POA: Insufficient documentation

## 2020-08-08 DIAGNOSIS — I10 Essential (primary) hypertension: Secondary | ICD-10-CM | POA: Insufficient documentation

## 2020-08-08 DIAGNOSIS — E785 Hyperlipidemia, unspecified: Secondary | ICD-10-CM | POA: Insufficient documentation

## 2020-08-08 DIAGNOSIS — C439 Malignant melanoma of skin, unspecified: Secondary | ICD-10-CM | POA: Insufficient documentation

## 2020-08-08 DIAGNOSIS — S72009A Fracture of unspecified part of neck of unspecified femur, initial encounter for closed fracture: Secondary | ICD-10-CM | POA: Insufficient documentation

## 2020-08-08 DIAGNOSIS — G629 Polyneuropathy, unspecified: Secondary | ICD-10-CM | POA: Insufficient documentation

## 2020-08-11 ENCOUNTER — Encounter: Payer: Medicare Other | Attending: Physical Medicine & Rehabilitation | Admitting: Registered Nurse

## 2020-08-11 ENCOUNTER — Other Ambulatory Visit: Payer: Self-pay

## 2020-08-11 ENCOUNTER — Encounter: Payer: Self-pay | Admitting: Registered Nurse

## 2020-08-11 VITALS — BP 147/83 | HR 80 | Temp 98.1°F | Ht 66.0 in | Wt 220.0 lb

## 2020-08-11 DIAGNOSIS — M7061 Trochanteric bursitis, right hip: Secondary | ICD-10-CM | POA: Diagnosis not present

## 2020-08-11 DIAGNOSIS — M62838 Other muscle spasm: Secondary | ICD-10-CM | POA: Diagnosis not present

## 2020-08-11 DIAGNOSIS — M2550 Pain in unspecified joint: Secondary | ICD-10-CM | POA: Diagnosis not present

## 2020-08-11 DIAGNOSIS — Z5181 Encounter for therapeutic drug level monitoring: Secondary | ICD-10-CM | POA: Diagnosis not present

## 2020-08-11 DIAGNOSIS — M961 Postlaminectomy syndrome, not elsewhere classified: Secondary | ICD-10-CM | POA: Diagnosis not present

## 2020-08-11 DIAGNOSIS — Z79899 Other long term (current) drug therapy: Secondary | ICD-10-CM | POA: Diagnosis not present

## 2020-08-11 DIAGNOSIS — M170 Bilateral primary osteoarthritis of knee: Secondary | ICD-10-CM | POA: Diagnosis not present

## 2020-08-11 DIAGNOSIS — S32000S Wedge compression fracture of unspecified lumbar vertebra, sequela: Secondary | ICD-10-CM | POA: Diagnosis not present

## 2020-08-11 DIAGNOSIS — G894 Chronic pain syndrome: Secondary | ICD-10-CM | POA: Insufficient documentation

## 2020-08-11 DIAGNOSIS — R202 Paresthesia of skin: Secondary | ICD-10-CM | POA: Insufficient documentation

## 2020-08-11 DIAGNOSIS — M7062 Trochanteric bursitis, left hip: Secondary | ICD-10-CM | POA: Insufficient documentation

## 2020-08-11 MED ORDER — HYDROCODONE-ACETAMINOPHEN 10-325 MG PO TABS: 1.0000 | ORAL_TABLET | Freq: Four times a day (QID) | ORAL | 0 refills | Status: DC | PRN

## 2020-08-11 NOTE — Progress Notes (Signed)
Subjective:    Patient ID: Crystal Lee, female    DOB: [birth year], 87 y.o.   MRN: [id number]  HPI : Crystal Lee is a 87 y.o. female who returns for follow up appointment for chronic pain and medication refill. She states her  pain is located in lower back, bilateral hips, bilateral lower extremities with tingling and numbness. Also reports generalized joint pain. She rates her pain 5. Her current exercise regime is walking with her walker.   [name] Morphine equivalent is 40.00  MME.   Oral Swab was Performed today.    Pain Inventory Average Pain 5 Pain Right Now 5 My pain is sharp, burning and aching  In the last 24 hours, has pain interfered with the following? General activity 5 Relation with others 3 Enjoyment of life 3 What TIME of day is your pain at its worst? varies Sleep (in general) NA  Pain is worse with: walking, standing and some activites Pain improves with: rest, heat/ice and medication Relief from Meds: 3  Family History  Problem Relation Age of Onset  . Other Mother        complications from flu  . Other Father        unsure of cause  . Colon cancer Neg Hx    Social History   Socioeconomic History  . Marital status: Widowed    Spouse name: Not on file  . Number of children: 3  . Years of education: 16 years  . Highest education level: Not on file  Occupational History  . Occupation: Retired  Tobacco Use  . Smoking status: Former Smoker    Packs/day: 0.50    Years: 20.00    Pack years: 10.00    Quit date: 02/03/1976    Years since quitting: 44.5  . Smokeless tobacco: Never Used  Vaping Use  . Vaping Use: Never used  Substance and Sexual Activity  . Alcohol use: No  . Drug use: No  . Sexual activity: Yes    Birth control/protection: Post-menopausal  Other Topics Concern  . Not on file  Social History Narrative   Lives at home with husband.   Right-handed.   No caffeine use.   Social Determinants of Health   Financial Resource  Strain: Not on file  Food Insecurity: Not on file  Transportation Needs: Not on file  Physical Activity: Not on file  Stress: Not on file  Social Connections: Not on file   Past Surgical History:  Procedure Laterality Date  . [place] STUDY N/A 06/21/2018   Procedure: [place] STUDY;  Surgeon: Lavena Bullion, DO;  [facility]: [facility];  Service: Gastroenterology;  Laterality: N/A;  with impedance  . ABDOMINAL HYSTERECTOMY  2010  . ANAL RECTAL MANOMETRY N/A 09/19/2019   Procedure: ANO RECTAL MANOMETRY;  Surgeon: Mauri Pole, MD;  [facility]: [facility];  Service: Endoscopy;  Laterality: N/A;  . APPENDECTOMY  1949  . [name]  . BIOPSY  06/10/2020   Procedure: BIOPSY;  Surgeon: Lavena Bullion, DO;  [facility];  Service: Gastroenterology;;  EGD and COLON  . BREAST SURGERY    . CATARACT EXTRACTION Bilateral 2011  . CHOLECYSTECTOMY    . COLON RESECTION N/A 06/12/2020   Procedure: LAPAROSCOPIC COLON RESECTION;  Surgeon: [name], MD;  [facility]: [facility];  Service: General;  Laterality: N/A;  . COLONOSCOPY WITH PROPOFOL N/A 06/10/2020   Procedure: COLONOSCOPY WITH PROPOFOL;  Surgeon: Lavena Bullion, DO;  [facility]:  [facility];  Service: Gastroenterology;  Laterality: N/A;  . ESOPHAGEAL MANOMETRY N/A 06/21/2018   Procedure: ESOPHAGEAL MANOMETRY (EM);  Surgeon: Lavena Bullion, DO;  [facility];  Service: Gastroenterology;  Laterality: N/A;  . ESOPHAGOGASTRODUODENOSCOPY (EGD) WITH PROPOFOL N/A 06/10/2020   Procedure: ESOPHAGOGASTRODUODENOSCOPY (EGD) WITH PROPOFOL;  Surgeon: Lavena Bullion, DO;  [facility];  Service: Gastroenterology;  Laterality: N/A;  . GALLBLADDER SURGERY  2015  . JOINT REPLACEMENT     RT TOTAL HIP / RT TOTAL KNEE  . MASTECTOMY  1998   BILATERAL   . [place] IMPEDANCE STUDY  06/21/2018   Procedure: [place] IMPEDANCE STUDY;  Surgeon: Lavena Bullion, DO;  [facility];  Service:  Gastroenterology;;  . POLYPECTOMY  06/10/2020   Procedure: POLYPECTOMY;  Surgeon: Lavena Bullion, DO;  [facility];  Service: Gastroenterology;;  . SKIN CANCER EXCISION  2016   RT SIDE OF NOSE  . SUBMUCOSAL TATTOO INJECTION  06/10/2020   Procedure: SUBMUCOSAL TATTOO INJECTION;  Surgeon: Lavena Bullion, DO;  [facility];  Service: Gastroenterology;;  . TONSILLECTOMY  1953  . TOTAL HIP ARTHROPLASTY  2010   RIGHT  . TOTAL HIP ARTHROPLASTY Left 05/09/2015   Procedure: LEFT TOTAL HIP ARTHROPLASTY ANTERIOR APPROACH;  Surgeon: Gaynelle Arabian, MD;  [facility];  Service: Orthopedics;  Laterality: Left;  . TOTAL KNEE ARTHROPLASTY  2001  . TUMOR REMOVAL  2012   ABDOMINAL - NON CANCEROUS   Past Surgical History:  Procedure Laterality Date  . [place] STUDY N/A 06/21/2018   Procedure: [place] STUDY;  Surgeon: Lavena Bullion, DO;  [facility];  Service: Gastroenterology;  Laterality: N/A;  with impedance  . ABDOMINAL HYSTERECTOMY  2010  . ANAL RECTAL MANOMETRY N/A 09/19/2019   Procedure: ANO RECTAL MANOMETRY;  Surgeon: Mauri Pole, MD;  [facility];  Service: Endoscopy;  Laterality: N/A;  . APPENDECTOMY  1949  . [place]  . BIOPSY  06/10/2020   Procedure: BIOPSY;  Surgeon: Lavena Bullion, DO;  [facility];  Service: Gastroenterology;;  EGD and COLON  . BREAST SURGERY    . CATARACT EXTRACTION Bilateral 2011  . CHOLECYSTECTOMY    . COLON RESECTION N/A 06/12/2020   Procedure: LAPAROSCOPIC COLON RESECTION;  Surgeon: [name], MD;  [facility];  Service: General;  Laterality: N/A;  . COLONOSCOPY WITH PROPOFOL N/A 06/10/2020   Procedure: COLONOSCOPY WITH PROPOFOL;  Surgeon: Lavena Bullion, DO;  [facility];  Service: Gastroenterology;  Laterality: N/A;  . ESOPHAGEAL MANOMETRY N/A 06/21/2018   Procedure: ESOPHAGEAL MANOMETRY (EM);  Surgeon: Lavena Bullion, DO;  [facility];  Service: Gastroenterology;  Laterality: N/A;  . ESOPHAGOGASTRODUODENOSCOPY (EGD) WITH PROPOFOL N/A 06/10/2020   Procedure: ESOPHAGOGASTRODUODENOSCOPY (EGD) WITH PROPOFOL;  Surgeon: Lavena Bullion, DO;  [facility];  Service: Gastroenterology;  Laterality: N/A;  . GALLBLADDER SURGERY  2015  . JOINT REPLACEMENT     RT TOTAL HIP / RT TOTAL KNEE  . MASTECTOMY  1998   BILATERAL   . [place] IMPEDANCE STUDY  06/21/2018   Procedure: [place] IMPEDANCE STUDY;  Surgeon: Lavena Bullion, DO;  [facility];  Service: Gastroenterology;;  . POLYPECTOMY  06/10/2020   Procedure: POLYPECTOMY;  Surgeon: Lavena Bullion, DO;  [facility];  Service: Gastroenterology;;  . SKIN CANCER EXCISION  2016   RT SIDE OF NOSE  . SUBMUCOSAL TATTOO INJECTION  06/10/2020   Procedure: SUBMUCOSAL  TATTOO INJECTION;  Surgeon: Lavena Bullion, DO;  [facility];  Service: Gastroenterology;;  . TONSILLECTOMY  1953  . TOTAL HIP ARTHROPLASTY  2010   RIGHT  . TOTAL HIP ARTHROPLASTY Left 05/09/2015   Procedure: LEFT TOTAL HIP ARTHROPLASTY ANTERIOR APPROACH;  Surgeon: Gaynelle Arabian, MD;  [facility];  Service: Orthopedics;  Laterality: Left;  . TOTAL KNEE ARTHROPLASTY  2001  . TUMOR REMOVAL  2012   ABDOMINAL - NON CANCEROUS   Past Medical History:  Diagnosis Date  . Arthritis   . Cancer (HCC)    HX BREAST CANCER/ SKIN CANCER  . Complication of anesthesia    N/V WITH MORPHINE  . Difficulty sleeping   . Fractured hip ([place])    LEFT - AUG 2016  . GERD (gastroesophageal reflux disease)   . Hyperlipidemia   . Hypertension   . Melanoma ([place])   . Neuropathy   . Nocturia   . Osteopenia   . 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 PONV (postoperative nausea and vomiting)    PT STATES MORPHINE CAUSED N/V  . Rosacea   . Sleep apnea   . Stage 1 breast cancer, ER+, right (HCC) 07/04/2017   BP (!) 147/83   Pulse 80   Temp 98.1 F (36.7 C)   Ht 5\' 6"  (1.676 m)    Wt 220 lb (99.8 kg)   SpO2 95%   BMI 35.51 kg/m   Opioid Risk Score:   Fall Risk Score:  `1  Depression screen PHQ 2/9  Depression screen [facility] 2/9 08/11/2020 05/20/2020 12/24/2019 11/22/2018 09/28/2018 06/02/2018 04/10/2018  Decreased Interest 0 0 0 0 0 0 0  Down, Depressed, Hopeless 0 0 0 0 0 0 0  PHQ - 2 Score 0 0 0 0 0 0 0  Altered sleeping - - - - - - -  Tired, decreased energy - - - - - - -  Change in appetite - - - - - - -  Feeling bad or failure about yourself  - - - - - - -  Trouble concentrating - - - - - - -  Moving slowly or fidgety/restless - - - - - - -  Suicidal thoughts - - - - - - -  PHQ-9 Score - - - - - - -  Difficult doing work/chores - - - - - - -    Review of Systems  Constitutional: Negative.   HENT: Negative.   Eyes: Negative.   Respiratory: Negative.   Cardiovascular: Negative.   Gastrointestinal: Negative.   Endocrine: Negative.   Musculoskeletal: Positive for back pain.  Skin: Negative.   Allergic/Immunologic: Negative.   Neurological: Negative.   Hematological: Negative.   Psychiatric/Behavioral: Negative.   All other systems reviewed and are negative.      Objective:   Physical Exam Vitals and nursing note reviewed.  Constitutional:      Appearance: Normal appearance.  Cardiovascular:     Rate and Rhythm: Normal rate and regular rhythm.     Pulses: Normal pulses.     Heart sounds: Normal heart sounds.  Pulmonary:     Effort: Pulmonary effort is normal.     Breath sounds: Normal breath sounds.  Musculoskeletal:     Cervical back: Normal range of motion and neck supple.     Comments: Normal Muscle Bulk and Muscle Testing Reveals:  Upper Extremities: Full ROM and Muscle Strength 5/5  Lumbar Paraspinal Tenderness: L-3-L-5 Bilateral Greater Trochanter Tenderness Lower Extremities: Full ROM  and Muscle Strength 5/5 Arises from chair slowly using cane for support Narrow Based  Gait   Skin:    General: Skin is warm and dry.   Neurological:     Mental Status: She is alert and oriented to person, place, and time.  Psychiatric:        Mood and Affect: Mood normal.        Behavior: Behavior normal.           Assessment & Plan:  1. Chronic Bilateral Leg pain: Paresthesia: Continue to Monitor.08/11/2020 2.Paresthesia /Lumbar Radiculitis:Ms. Harrellhasweaned herself off the Lyrica due to daytime drowsiness. We will continue to monitor.08/11/2020 3. Pain of Left Wrist/:No complaints today.S/P Carpal Tunnel Release on 06/03/2017 by Dr. Ellene Route. Dr. Ellene Route Following.08/11/2020. 4. Fracture of superior pubic ramus.Dr. AluisioFollowing.Continue to monitor.08/11/2020. 5.BilateralKnee OA: Continue Voltaren Gel.Continue to monitor. Orthopedist following.08/11/2020 6. Polyarthralgia: Continue to alternate with heat and ice therapy. Continue current medication regime. Continue to monitor.08/11/2020. 7. Chronic Pain Syndrome:Refilled::Hydrocodone10/325 mg one tablet every 6 hours as needed for moderate pain #120.08/11/2020. 8. Lumbar Compression Fracture L2 and L3: Ms. Delira refused physical therapy.Continue with rest/ heat therapy. Continue to Monitor03/14/2022. 9. Muscle Spasm:ContinueFlexeril 5 mg at HS. Continue to monitor.08/11/2020.  F/U in 1 Month

## 2020-08-13 ENCOUNTER — Inpatient Hospital Stay: Payer: Medicare Other | Attending: Hematology & Oncology

## 2020-08-13 ENCOUNTER — Other Ambulatory Visit: Payer: Self-pay

## 2020-08-13 ENCOUNTER — Inpatient Hospital Stay (HOSPITAL_BASED_OUTPATIENT_CLINIC_OR_DEPARTMENT_OTHER): Payer: Medicare Other | Admitting: Hematology & Oncology

## 2020-08-13 ENCOUNTER — Encounter: Payer: Self-pay | Admitting: Hematology & Oncology

## 2020-08-13 ENCOUNTER — Encounter: Payer: Self-pay | Admitting: *Deleted

## 2020-08-13 VITALS — BP 133/58 | HR 75 | Temp 98.0°F | Resp 18 | Ht 66.0 in | Wt 217.8 lb

## 2020-08-13 DIAGNOSIS — C186 Malignant neoplasm of descending colon: Secondary | ICD-10-CM | POA: Diagnosis not present

## 2020-08-13 DIAGNOSIS — Z853 Personal history of malignant neoplasm of breast: Secondary | ICD-10-CM | POA: Diagnosis not present

## 2020-08-13 DIAGNOSIS — Z79899 Other long term (current) drug therapy: Secondary | ICD-10-CM | POA: Insufficient documentation

## 2020-08-13 DIAGNOSIS — C184 Malignant neoplasm of transverse colon: Secondary | ICD-10-CM | POA: Diagnosis not present

## 2020-08-13 DIAGNOSIS — M810 Age-related osteoporosis without current pathological fracture: Secondary | ICD-10-CM | POA: Diagnosis not present

## 2020-08-13 DIAGNOSIS — K5900 Constipation, unspecified: Secondary | ICD-10-CM | POA: Diagnosis not present

## 2020-08-13 LAB — CBC WITH DIFFERENTIAL (CANCER CENTER ONLY)
Abs Immature Granulocytes: 0.01 10*3/uL (ref 0.00–0.07)
Basophils Absolute: 0 10*3/uL (ref 0.0–0.1)
Basophils Relative: 0 %
Eosinophils Absolute: 0.3 10*3/uL (ref 0.0–0.5)
Eosinophils Relative: 5 %
HCT: 39.7 % (ref 36.0–46.0)
Hemoglobin: 12.8 g/dL (ref 12.0–15.0)
Immature Granulocytes: 0 %
Lymphocytes Relative: 40 %
Lymphs Abs: 2.6 10*3/uL (ref 0.7–4.0)
MCH: 29.4 pg (ref 26.0–34.0)
MCHC: 32.2 g/dL (ref 30.0–36.0)
MCV: 91.1 fL (ref 80.0–100.0)
Monocytes Absolute: 0.7 10*3/uL (ref 0.1–1.0)
Monocytes Relative: 10 %
Neutro Abs: 2.9 10*3/uL (ref 1.7–7.7)
Neutrophils Relative %: 45 %
Platelet Count: 127 10*3/uL — ABNORMAL LOW (ref 150–400)
RBC: 4.36 MIL/uL (ref 3.87–5.11)
RDW: 15.9 % — ABNORMAL HIGH (ref 11.5–15.5)
WBC Count: 6.5 10*3/uL (ref 4.0–10.5)
nRBC: 0 % (ref 0.0–0.2)

## 2020-08-13 LAB — LACTATE DEHYDROGENASE: LDH: 171 U/L (ref 98–192)

## 2020-08-13 LAB — CMP (CANCER CENTER ONLY)
ALT: 18 U/L (ref 0–44)
AST: 32 U/L (ref 15–41)
Albumin: 4.2 g/dL (ref 3.5–5.0)
Alkaline Phosphatase: 128 U/L — ABNORMAL HIGH (ref 38–126)
Anion gap: 5 (ref 5–15)
BUN: 8 mg/dL (ref 8–23)
CO2: 28 mmol/L (ref 22–32)
Calcium: 9.7 mg/dL (ref 8.9–10.3)
Chloride: 105 mmol/L (ref 98–111)
Creatinine: 0.67 mg/dL (ref 0.44–1.00)
GFR, Estimated: >60 mL/min (ref >60)
Glucose, Bld: 103 mg/dL — ABNORMAL HIGH (ref 70–99)
Potassium: 4.6 mmol/L (ref 3.5–5.1)
Sodium: 138 mmol/L (ref 135–145)
Total Bilirubin: 0.4 mg/dL (ref 0.3–1.2)
Total Protein: 6.8 g/dL (ref 6.5–8.1)

## 2020-08-13 LAB — CEA (IN HOUSE-CHCC): CEA (CHCC-In House): 2.26 ng/mL (ref 0.00–5.00)

## 2020-08-13 NOTE — Progress Notes (Signed)
Oncology Nurse Navigator Documentation  Oncology Nurse Navigator Flowsheets 08/13/2020  Confirmed Diagnosis Date -  Diagnosis Status -  Planned Course of Treatment -  Phase of Treatment Chemo  Chemotherapy Pending- Reason: -  Chemotherapy Actual Start Date: -  Navigator Follow Up Date: 09/03/2020  Navigator Follow Up Reason: Follow-up Appointment  Navigator Location [facility]  Navigator Encounter Type Appt/Treatment Plan Review  Telephone -  Treatment Initiated Date -  Patient Visit Type MedOnc  Treatment Phase Active Tx  Barriers/Navigation Needs Coordination of Care;Education;Anxiety;Transportation  Education -  Interventions None Required  Acuity Level 2-Minimal Needs (1-2 Barriers Identified)  Coordination of Care -  Education Method -  Time Spent with Patient 15

## 2020-08-13 NOTE — Progress Notes (Signed)
Hematology and Oncology Follow Up Visit  Crystal Lee [id number] [birth year] 87 y.o. 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 Year -- next dose on 11/2020   Interim History:  Crystal Lee is here today for follow-up.  So far, she is doing okay.  She has had 1 cycle of the Xeloda.  She has had no problems with Xeloda.  She still has constipation.  She is on medication to help try to help with constipation.  She has had no mouth sores.  She has had no nausea or vomiting.  Her weight is holding stable.  There is been no rashes.  Her hands and feet are slightly red but no cracking or peeling of the skin.  She has had no issues with headache.  She has had no visual changes.  Overall, I would say her performance status is [name].    Medications:  Allergies as of 08/13/2020      Reactions   Darvon [propoxyphene] Nausea And Vomiting, Palpitations   Darvocet Causes Sweats   Adhesive [tape] Rash   Lisinopril Cough   Morphine And Related Nausea And Vomiting      Medication List       Accurate as of August 13, 2020 10:46 AM. If you have any questions, ask your nurse or doctor.        capecitabine 500 MG tablet Commonly known as: XELODA Take 4 tablets (2,000 mg total) by mouth 2 (two) times daily after a meal. Take for 14 days, then hold for 7 days. Repeat every 21 days.   cyclobenzaprine 5 MG tablet Commonly known as: FLEXERIL Take 1 tablet (5 mg total) by mouth at bedtime as needed for muscle spasms.   fenofibrate 48 MG tablet Commonly known as: Tricor Take 1 tablet (48 mg total) by mouth daily.   HYDROcodone-acetaminophen 10-325 MG tablet Commonly known as:  NORCO Take 1 tablet by mouth every 6 (six) hours as needed.   losartan 25 MG tablet Commonly known as: COZAAR Take 1 tablet (25 mg total) by mouth daily.   Multivitamin Adults Tabs Take 1 tablet by mouth daily.   naloxegol oxalate 12.5 MG Tabs tablet Commonly known as: MOVANTIK Take 1 tablet (12.5 mg total) by mouth daily.   promethazine 12.5 MG tablet Commonly known as: PHENERGAN Take 1 tablet (12.5 mg total) by mouth every 6 (six) hours as needed for nausea or vomiting.       Allergies:  Allergies  Allergen Reactions  . Darvon [Propoxyphene] Nausea And Vomiting and Palpitations    Darvocet Causes Sweats  . Adhesive [Tape] Rash  . Lisinopril Cough  . Morphine And Related Nausea And Vomiting    Past Medical History, Surgical history, Social history, and Family History were reviewed and updated.  Review of Systems: Review of Systems  Constitutional: Negative.   HENT: Negative.   Eyes: Negative.   Respiratory: Negative.   Cardiovascular: Positive for leg swelling.  Gastrointestinal: Positive for constipation.  Genitourinary: Negative.   Musculoskeletal: Negative.   Skin: Negative.   Neurological: Negative.   Endo/Heme/Allergies: Negative.   Psychiatric/Behavioral: Negative.      Physical Exam:  height is $RemoveB'5\' 6"'OTwYGhBw$  (1.676 m) and weight is 217 lb 12.8  oz (98.8 kg). Her oral temperature is 98 F (36.7 C). Her blood pressure is 133/58 (abnormal) and her pulse is 75. Her respiration is 18 and oxygen saturation is 98%.   Wt Readings from Last 3 Encounters:  08/13/20 217 lb 12.8 oz (98.8 kg)  08/11/20 220 lb (99.8 kg)  07/21/20 218 lb (98.9 kg)    Physical Exam Vitals reviewed.  HENT:     Head: Normocephalic and atraumatic.  Eyes:     Pupils: Pupils are equal, round, and reactive to light.  Cardiovascular:     Rate and Rhythm: Normal rate and regular rhythm.     Heart sounds: Normal heart sounds.  Pulmonary:     Effort: Pulmonary effort is normal.     Breath  sounds: Normal breath sounds.  Abdominal:     General: Bowel sounds are normal.     Palpations: Abdomen is soft.     Comments: Abdominal exam shows a soft abdomen.  She has well-healed laparoscopy scar.  She has no fluid wave.  There is no guarding or rebound tenderness.  There is no palpable liver or spleen tip.  Musculoskeletal:        General: No tenderness or deformity. Normal range of motion.     Cervical back: Normal range of motion.  Lymphadenopathy:     Cervical: No cervical adenopathy.  Skin:    General: Skin is warm and dry.     Findings: No erythema or rash.  Neurological:     Mental Status: She is alert and oriented to person, place, and time.  Psychiatric:        Behavior: Behavior normal.        Thought Content: Thought content normal.        Judgment: Judgment normal.   Or sooner for Crystal Lee is again Lab Results  Component Value Date   WBC 6.5 08/13/2020   HGB 12.8 08/13/2020   HCT 39.7 08/13/2020   MCV 91.1 08/13/2020   PLT 127 (L) 08/13/2020   Lab Results  Component Value Date   FERRITIN 45 06/09/2020   IRON 49 06/09/2020   TIBC 320 06/09/2020   UIBC 271 06/09/2020   IRONPCTSAT 15 06/09/2020   Lab Results  Component Value Date   RETICCTPCT 1.6 06/09/2020   RBC 4.36 08/13/2020   No results found for: Crystal Lee, [facility] Lab Results  Component Value Date   IGGSERUM 1,085 02/09/2017   IGMSERUM 336 (H) 02/09/2017   No results found for: Crystal Lee, [facility]I   Chemistry      Component Value Date/Time   NA 138 08/13/2020 0902   NA 139 02/25/2020 1627   K 4.6 08/13/2020 0902   CL 105 08/13/2020 0902   CO2 28 08/13/2020 0902   BUN 8 08/13/2020 0902   BUN 11 02/25/2020 1627   CREATININE 0.67 08/13/2020 0902      Component Value Date/Time   CALCIUM 9.7 08/13/2020 0902   ALKPHOS 128 (H) 08/13/2020 0902   AST 32 08/13/2020 0902   ALT 18 08/13/2020 0902   BILITOT 0.4 08/13/2020  0902       Impression and Plan: [name] is a very pleasant 87 yo caucasian female with history of bilateral stage Ia breast cancer diagnosed back in 1998. She now has a new problem with respect to a locally advanced colon cancer.  She has stage IIIb disease.  She had a 3+ lymph nodes.  We do have her on adjuvant  therapy with single agent Xeloda.  I thought this would be reasonable given her age and overall performance status.  She will go ahead and start her second cycle of Xeloda.  I am planning for a total of 6 cycles of Xeloda.  We will plan to get her back in 3 more weeks.  I probably would not plan for any scans until May or June.  [name], MD 3/16/202210:46 AM

## 2020-08-17 LAB — DRUG TOX MONITOR 1 W/CONF, ORAL FLD
Amphetamines: NEGATIVE ng/mL (ref <10)
Barbiturates: NEGATIVE ng/mL (ref <10)
Benzodiazepines: NEGATIVE ng/mL (ref <0.50)
Buprenorphine: NEGATIVE ng/mL (ref <0.10)
Cocaine: NEGATIVE ng/mL (ref <5.0)
Codeine: NEGATIVE ng/mL (ref <2.5)
Dihydrocodeine: 19.6 ng/mL — ABNORMAL HIGH (ref <2.5)
Fentanyl: NEGATIVE ng/mL (ref <0.10)
Heroin Metabolite: NEGATIVE ng/mL (ref <1.0)
Hydrocodone: >250 ng/mL — ABNORMAL HIGH (ref <2.5)
Hydromorphone: NEGATIVE ng/mL (ref <2.5)
MARIJUANA: NEGATIVE ng/mL (ref <2.5)
MDMA: NEGATIVE ng/mL (ref <10)
Meprobamate: NEGATIVE ng/mL (ref <2.5)
Methadone: NEGATIVE ng/mL (ref <5.0)
Morphine: NEGATIVE ng/mL (ref <2.5)
Nicotine Metabolite: NEGATIVE ng/mL (ref <5.0)
Norhydrocodone: 7.9 ng/mL — ABNORMAL HIGH (ref <2.5)
Noroxycodone: NEGATIVE ng/mL (ref <2.5)
Opiates: POSITIVE ng/mL — AB (ref <2.5)
Oxycodone: NEGATIVE ng/mL (ref <2.5)
Oxymorphone: NEGATIVE ng/mL (ref <2.5)
Phencyclidine: NEGATIVE ng/mL (ref <10)
Tapentadol: NEGATIVE ng/mL (ref <5.0)
Tramadol: NEGATIVE ng/mL (ref <5.0)
Zolpidem: NEGATIVE ng/mL (ref <5.0)

## 2020-08-17 LAB — DRUG TOX ALC METAB W/CON, ORAL FLD: Alcohol Metabolite: NEGATIVE ng/mL (ref <25)

## 2020-08-18 NOTE — Progress Notes (Unsigned)
Cardiology Office Note:    Date:  08/19/2020   ID:  Crystal Lee, DOB 1933/08/08, MRN [id number]  PCP:  Darreld Mclean, MD  Cardiologist:  Shirlee More, MD    Referring MD: Darreld Mclean, MD    ASSESSMENT:    1. Hypertensive heart disease with chronic diastolic congestive heart failure ([place])   2. Dyslipidemia    PLAN:    In order of problems listed above:  1. She is improved at this time no volume overload not on a loop diuretic I have told her that she can use her judgment take a dose of furosemide as needed for edema. 2. BP at target continue her ARB 3. Continue fenofibrate   Next appointment: 6 months   Medication Adjustments/Labs and Tests Ordered: Current medicines are reviewed at length with the patient today.  Concerns regarding medicines are outlined above.  No orders of the defined types were placed in this encounter.  No orders of the defined types were placed in this encounter.   Chief Complaint  Patient presents with  . Follow-up  . Congestive Heart Failure    History of Present Illness:    Crystal Lee is a 87 y.o. female with a hx of hypertensive heart disease with chronic diastolic heart failure and dyslipidemia.  She was last seen 02/25/2020. Compliance with diet, lifestyle and medications: Yes  Since she had laparoscopic colon resection she has not been on a loop diuretic. She has no shortness of breath or edema no chest pain palpitation or syncope. She follows with oncology for colorectal cancer being treated with Xeloda. Past Medical History:  Diagnosis Date  . Arthritis   . Cancer (HCC)    HX BREAST CANCER/ SKIN CANCER  . Complication of anesthesia    N/V WITH MORPHINE  . Difficulty sleeping   . Fractured hip ([place])    LEFT - AUG 2016  . GERD (gastroesophageal reflux disease)   . Hyperlipidemia   . Hypertension   . Melanoma ([place])   . Neuropathy   . Nocturia   . Osteopenia   . 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 PONV (postoperative nausea and vomiting)    PT STATES MORPHINE CAUSED N/V  . Rosacea   . Sleep apnea   . Stage 1 breast cancer, ER+, right ([place]) 07/04/2017    Past Surgical History:  Procedure Laterality Date  . [place] STUDY N/A 06/21/2018   Procedure: [place] STUDY;  Surgeon: Lavena Bullion, DO;  Location: [facility];  Service: Gastroenterology;  Laterality: N/A;  with impedance  . ABDOMINAL HYSTERECTOMY  2010  . ANAL RECTAL MANOMETRY N/A 09/19/2019   Procedure: ANO RECTAL MANOMETRY;  Surgeon: Mauri Pole, MD;  Location: [facility];  Service: Endoscopy;  Laterality: N/A;  . APPENDECTOMY  1949  . [place]  . BIOPSY  06/10/2020   Procedure: BIOPSY;  Surgeon: Lavena Bullion, DO;  Location: [facility];  Service: Gastroenterology;;  EGD and COLON  . BREAST SURGERY    . CATARACT EXTRACTION Bilateral 2011  . CHOLECYSTECTOMY    . COLON RESECTION N/A 06/12/2020   Procedure: LAPAROSCOPIC COLON RESECTION;  Surgeon: [name], MD;  Location: [facility];  Service: General;  Laterality: N/A;  . COLONOSCOPY WITH PROPOFOL N/A 06/10/2020   Procedure: COLONOSCOPY WITH PROPOFOL;  Surgeon: Lavena Bullion, DO;  [facility];  Service: Gastroenterology;  Laterality: N/A;  . ESOPHAGEAL MANOMETRY N/A 06/21/2018   Procedure: ESOPHAGEAL MANOMETRY (  EM);  Surgeon: Lavena Bullion, DO;  [facility];  Service: Gastroenterology;  Laterality: N/A;  . ESOPHAGOGASTRODUODENOSCOPY (EGD) WITH PROPOFOL N/A 06/10/2020   Procedure: ESOPHAGOGASTRODUODENOSCOPY (EGD) WITH PROPOFOL;  Surgeon: Lavena Bullion, DO;  [facility];  Service: Gastroenterology;  Laterality: N/A;  . GALLBLADDER SURGERY  2015  . JOINT REPLACEMENT     RT TOTAL HIP / RT TOTAL KNEE  . MASTECTOMY  1998   BILATERAL   . [place] IMPEDANCE STUDY  06/21/2018   Procedure: [place] IMPEDANCE STUDY;  Surgeon: Lavena Bullion, DO;  [facility];  Service: Gastroenterology;;  .  POLYPECTOMY  06/10/2020   Procedure: POLYPECTOMY;  Surgeon: Lavena Bullion, DO;  [facility];  Service: Gastroenterology;;  . SKIN CANCER EXCISION  2016   RT SIDE OF NOSE  . SUBMUCOSAL TATTOO INJECTION  06/10/2020   Procedure: SUBMUCOSAL TATTOO INJECTION;  Surgeon: Lavena Bullion, DO;  [facility];  Service: Gastroenterology;;  . TONSILLECTOMY  1953  . TOTAL HIP ARTHROPLASTY  2010   RIGHT  . TOTAL HIP ARTHROPLASTY Left 05/09/2015   Procedure: LEFT TOTAL HIP ARTHROPLASTY ANTERIOR APPROACH;  Surgeon: Gaynelle Arabian, MD;  Location: [facility];  Service: Orthopedics;  Laterality: Left;  . TOTAL KNEE ARTHROPLASTY  2001  . TUMOR REMOVAL  2012   ABDOMINAL - NON CANCEROUS    Current Medications: Current Meds  Medication Sig  . capecitabine (XELODA) 500 MG tablet Take 4 tablets (2,000 mg total) by mouth 2 (two) times daily after a meal. Take for 14 days, then hold for 7 days. Repeat every 21 days.  . cyclobenzaprine (FLEXERIL) 5 MG tablet Take 1 tablet (5 mg total) by mouth at bedtime as needed for muscle spasms.  . fenofibrate (TRICOR) 48 MG tablet Take 1 tablet (48 mg total) by mouth daily.  Marland Kitchen HYDROcodone-acetaminophen (NORCO) 10-325 MG tablet Take 1 tablet by mouth every 6 (six) hours as needed.  Marland Kitchen losartan (COZAAR) 25 MG tablet Take 1 tablet (25 mg total) by mouth daily.  . Multiple Vitamins-Minerals (MULTIVITAMIN ADULTS) TABS Take 1 tablet by mouth daily.  . naloxegol oxalate (MOVANTIK) 12.5 MG TABS tablet Take 1 tablet (12.5 mg total) by mouth daily.  . promethazine (PHENERGAN) 12.5 MG tablet Take 1 tablet (12.5 mg total) by mouth every 6 (six) hours as needed for nausea or vomiting.     Allergies:   Darvon [propoxyphene], Adhesive [tape], Lisinopril, and Morphine and related   Social History   Socioeconomic History  . Marital status: Widowed    Spouse name: Not on file  . Number of children: 3  . Years of education: 16 years  . Highest education level: Not on  file  Occupational History  . Occupation: Retired  Tobacco Use  . Smoking status: Former Smoker    Packs/day: 0.50    Years: 20.00    Pack years: 10.00    Quit date: 02/03/1976    Years since quitting: 44.5  . Smokeless tobacco: Never Used  Vaping Use  . Vaping Use: Never used  Substance and Sexual Activity  . Alcohol use: No  . Drug use: No  . Sexual activity: Yes    Birth control/protection: Post-menopausal  Other Topics Concern  . Not on file  Social History Narrative   Lives at home with husband.   Right-handed.   No caffeine use.   Social Determinants of Health   Financial Resource Strain: Not on file  Food Insecurity: Not on file  Transportation Needs:  Not on file  Physical Activity: Not on file  Stress: Not on file  Social Connections: Not on file     Family History: The patient's family history includes Other in her father and mother. There is no history of Colon cancer. ROS:   Please see the history of present illness.    All other systems reviewed and are negative.  EKGs/Labs/Other Studies Reviewed:    The following studies were reviewed today:    Recent Labs: 02/25/2020: NT-Pro BNP 90 08/13/2020: ALT 18; BUN 8; Creatinine 0.67; Hemoglobin 12.8; Platelet Count 127; Potassium 4.6; Sodium 138  Recent Lipid Panel    Component Value Date/Time   CHOL 144 07/17/2020 1146   TRIG 131 07/17/2020 1146   HDL 33 (L) 07/17/2020 1146   CHOLHDL 4.4 07/17/2020 1146   VLDL 26 07/17/2020 1146   LDLCALC 85 07/17/2020 1146    Physical Exam:    VS:  BP (!) 144/80   Pulse 70   Ht 5\' 6"  (1.676 m)   Wt 216 lb 1.3 oz (98 kg)   SpO2 97%   BMI 34.88 kg/m     Wt Readings from Last 3 Encounters:  08/19/20 216 lb 1.3 oz (98 kg)  08/13/20 217 lb 12.8 oz (98.8 kg)  08/11/20 220 lb (99.8 kg)     GEN:  Well nourished, well developed in no acute distress HEENT: Normal NECK: No JVD; No carotid bruits LYMPHATICS: No lymphadenopathy CARDIAC: RRR, no murmurs, rubs,  gallops RESPIRATORY:  Clear to auscultation without rales, wheezing or rhonchi  ABDOMEN: Soft, non-tender, non-distended MUSCULOSKELETAL:  No edema; No deformity  SKIN: Warm and dry NEUROLOGIC:  Alert and oriented x 3 PSYCHIATRIC:  Normal affect    Signed, Shirlee More, MD  08/19/2020 3:52 PM    [facility]

## 2020-08-19 ENCOUNTER — Ambulatory Visit (INDEPENDENT_AMBULATORY_CARE_PROVIDER_SITE_OTHER): Payer: Medicare Other | Admitting: Cardiology

## 2020-08-19 ENCOUNTER — Other Ambulatory Visit: Payer: Self-pay

## 2020-08-19 ENCOUNTER — Encounter: Payer: Self-pay | Admitting: Cardiology

## 2020-08-19 VITALS — BP 144/80 | HR 70 | Ht 66.0 in | Wt 216.1 lb

## 2020-08-19 DIAGNOSIS — E785 Hyperlipidemia, unspecified: Secondary | ICD-10-CM

## 2020-08-19 DIAGNOSIS — I5032 Chronic diastolic (congestive) heart failure: Secondary | ICD-10-CM

## 2020-08-19 DIAGNOSIS — I110 Hypertensive heart disease with heart failure: Secondary | ICD-10-CM | POA: Diagnosis not present

## 2020-08-19 NOTE — Patient Instructions (Signed)

## 2020-08-21 ENCOUNTER — Telehealth: Payer: Self-pay | Admitting: *Deleted

## 2020-08-21 NOTE — Telephone Encounter (Signed)
Oral swab drug screen was consistent for prescribed medications.  ?

## 2020-08-22 ENCOUNTER — Other Ambulatory Visit: Payer: Medicare Other

## 2020-08-22 ENCOUNTER — Ambulatory Visit: Payer: Medicare Other | Admitting: Hematology & Oncology

## 2020-08-26 ENCOUNTER — Other Ambulatory Visit (HOSPITAL_COMMUNITY): Payer: Self-pay

## 2020-08-29 MED FILL — CAPECITABINE 500 MG TABLET: 500 | 14 days supply | Qty: 112 | Fill #2

## 2020-09-03 ENCOUNTER — Inpatient Hospital Stay: Payer: Medicare Other | Attending: Hematology & Oncology

## 2020-09-03 ENCOUNTER — Other Ambulatory Visit: Payer: Self-pay

## 2020-09-03 ENCOUNTER — Encounter: Payer: Self-pay | Admitting: *Deleted

## 2020-09-03 ENCOUNTER — Encounter: Payer: Self-pay | Admitting: Hematology & Oncology

## 2020-09-03 ENCOUNTER — Inpatient Hospital Stay (HOSPITAL_BASED_OUTPATIENT_CLINIC_OR_DEPARTMENT_OTHER): Payer: Medicare Other | Admitting: Hematology & Oncology

## 2020-09-03 VITALS — BP 139/67 | HR 75 | Temp 98.0°F | Resp 20 | Wt 215.1 lb

## 2020-09-03 DIAGNOSIS — M810 Age-related osteoporosis without current pathological fracture: Secondary | ICD-10-CM | POA: Diagnosis not present

## 2020-09-03 DIAGNOSIS — C184 Malignant neoplasm of transverse colon: Secondary | ICD-10-CM | POA: Insufficient documentation

## 2020-09-03 DIAGNOSIS — H538 Other visual disturbances: Secondary | ICD-10-CM | POA: Diagnosis not present

## 2020-09-03 DIAGNOSIS — C186 Malignant neoplasm of descending colon: Secondary | ICD-10-CM | POA: Diagnosis not present

## 2020-09-03 DIAGNOSIS — Z853 Personal history of malignant neoplasm of breast: Secondary | ICD-10-CM | POA: Insufficient documentation

## 2020-09-03 DIAGNOSIS — Z79899 Other long term (current) drug therapy: Secondary | ICD-10-CM | POA: Diagnosis not present

## 2020-09-03 LAB — CBC WITH DIFFERENTIAL (CANCER CENTER ONLY)
Abs Immature Granulocytes: 0.01 K/uL (ref 0.00–0.07)
Basophils Absolute: 0 K/uL (ref 0.0–0.1)
Basophils Relative: 0 %
Eosinophils Absolute: 0.3 K/uL (ref 0.0–0.5)
Eosinophils Relative: 3 %
HCT: 39.5 % (ref 36.0–46.0)
Hemoglobin: 12.7 g/dL (ref 12.0–15.0)
Immature Granulocytes: 0 %
Lymphocytes Relative: 42 %
Lymphs Abs: 3.2 K/uL (ref 0.7–4.0)
MCH: 30 pg (ref 26.0–34.0)
MCHC: 32.2 g/dL (ref 30.0–36.0)
MCV: 93.4 fL (ref 80.0–100.0)
Monocytes Absolute: 0.6 K/uL (ref 0.1–1.0)
Monocytes Relative: 8 %
Neutro Abs: 3.5 K/uL (ref 1.7–7.7)
Neutrophils Relative %: 47 %
Platelet Count: 130 K/uL — ABNORMAL LOW (ref 150–400)
RBC: 4.23 MIL/uL (ref 3.87–5.11)
RDW: 17.7 % — ABNORMAL HIGH (ref 11.5–15.5)
WBC Count: 7.6 K/uL (ref 4.0–10.5)
nRBC: 0 % (ref 0.0–0.2)

## 2020-09-03 LAB — CMP (CANCER CENTER ONLY)
ALT: 14 U/L (ref 0–44)
AST: 20 U/L (ref 15–41)
Albumin: 4.2 g/dL (ref 3.5–5.0)
Alkaline Phosphatase: 92 U/L (ref 38–126)
Anion gap: 7 (ref 5–15)
BUN: 17 mg/dL (ref 8–23)
CO2: 29 mmol/L (ref 22–32)
Calcium: 10.4 mg/dL — ABNORMAL HIGH (ref 8.9–10.3)
Chloride: 102 mmol/L (ref 98–111)
Creatinine: 0.7 mg/dL (ref 0.44–1.00)
GFR, Estimated: >60 mL/min
Glucose, Bld: 101 mg/dL — ABNORMAL HIGH (ref 70–99)
Potassium: 4.3 mmol/L (ref 3.5–5.1)
Sodium: 138 mmol/L (ref 135–145)
Total Bilirubin: 0.5 mg/dL (ref 0.3–1.2)
Total Protein: 7 g/dL (ref 6.5–8.1)

## 2020-09-03 NOTE — Progress Notes (Signed)
Hematology and Oncology Follow Up Visit  Crystal Lee [id number] Aug 03, 1933 87 y.o. 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 Year -- next dose on 11/2020   Interim History:  [name] is here today for follow-up.  So far, she is doing okay.  She has had 2 cycles of Xeloda right now.  She tolerated this quite well.  She has had no problems with mouth sores.  She has had no problems with her skin although the skin on her face seems to be a little bit dry.  There is been no problems with diarrhea.  She is eating pretty well.  She is having no nausea or vomiting.  She has had no issues with leg swelling.  There is been no rashes.  Overall, I would say her performance status is [name].    Medications:  Allergies as of 09/03/2020      Reactions   Darvon [propoxyphene] Nausea And Vomiting, Palpitations   Darvocet Causes Sweats   Adhesive [tape] Rash   Lisinopril Cough   Morphine And Related Nausea And Vomiting      Medication List       Accurate as of September 03, 2020  4:10 PM. If you have any questions, ask your nurse or doctor.        capecitabine 500 MG tablet Commonly known as: XELODA TAKE 4 TABLETS BY MOUTH 2 TIMES DAILY AFTER A MEAL. TAKE FOR 14 DAYS, THEN HOLD FOR 7 DAYS. REPEAT EVERY 21 DAYS.   cyclobenzaprine 5 MG tablet Commonly known as: FLEXERIL Take 1 tablet (5 mg total) by mouth at bedtime as needed for muscle spasms.   fenofibrate 48 MG tablet Commonly known as: Tricor Take 1 tablet (48 mg total) by mouth daily.   HYDROcodone-acetaminophen 10-325 MG tablet Commonly known as: NORCO Take 1 tablet by mouth every 6 (six) hours as needed.   losartan 25  MG tablet Commonly known as: COZAAR Take 1 tablet (25 mg total) by mouth daily.   Multivitamin Adults Tabs Take 1 tablet by mouth daily.   naloxegol oxalate 12.5 MG Tabs tablet Commonly known as: MOVANTIK Take 1 tablet (12.5 mg total) by mouth daily.   promethazine 12.5 MG tablet Commonly known as: PHENERGAN Take 1 tablet (12.5 mg total) by mouth every 6 (six) hours as needed for nausea or vomiting.       Allergies:  Allergies  Allergen Reactions  . Darvon [Propoxyphene] Nausea And Vomiting and Palpitations    Darvocet Causes Sweats  . Adhesive [Tape] Rash  . Lisinopril Cough  . Morphine And Related Nausea And Vomiting    Past Medical History, Surgical history, Social history, and Family History were reviewed and updated.  Review of Systems: Review of Systems  Constitutional: Negative.   HENT: Negative.   Eyes: Negative.   Respiratory: Negative.   Cardiovascular: Positive for leg swelling.  Gastrointestinal: Positive for constipation.  Genitourinary: Negative.   Musculoskeletal: Negative.   Skin: Negative.   Neurological: Negative.   Endo/Heme/Allergies: Negative.   Psychiatric/Behavioral: Negative.      Physical Exam:  weight is 215 lb 1.9 oz (97.6 kg). Her oral temperature is 98 F (36.7 C). Her blood pressure is 139/67 and her  pulse is 75. Her respiration is 20 and oxygen saturation is 97%.   Wt Readings from Last 3 Encounters:  09/03/20 215 lb 1.9 oz (97.6 kg)  08/19/20 216 lb 1.3 oz (98 kg)  08/13/20 217 lb 12.8 oz (98.8 kg)    Physical Exam Vitals reviewed.  HENT:     Head: Normocephalic and atraumatic.  Eyes:     Pupils: Pupils are equal, round, and reactive to light.  Cardiovascular:     Rate and Rhythm: Normal rate and regular rhythm.     Heart sounds: Normal heart sounds.  Pulmonary:     Effort: Pulmonary effort is normal.     Breath sounds: Normal breath sounds.  Abdominal:     General: Bowel sounds are normal.     Palpations: Abdomen  is soft.     Comments: Abdominal exam shows a soft abdomen.  She has well-healed laparoscopy scar.  She has no fluid wave.  There is no guarding or rebound tenderness.  There is no palpable liver or spleen tip.  Musculoskeletal:        General: No tenderness or deformity. Normal range of motion.     Cervical back: Normal range of motion.  Lymphadenopathy:     Cervical: No cervical adenopathy.  Skin:    General: Skin is warm and dry.     Findings: No erythema or rash.  Neurological:     Mental Status: She is alert and oriented to person, place, and time.  Psychiatric:        Behavior: Behavior normal.        Thought Content: Thought content normal.        Judgment: Judgment normal.   Or sooner for Crystal Lee is again Lab Results  Component Value Date   WBC 7.6 09/03/2020   HGB 12.7 09/03/2020   HCT 39.5 09/03/2020   MCV 93.4 09/03/2020   PLT 130 (L) 09/03/2020   Lab Results  Component Value Date   FERRITIN 45 06/09/2020   IRON 49 06/09/2020   TIBC 320 06/09/2020   UIBC 271 06/09/2020   IRONPCTSAT 15 06/09/2020   Lab Results  Component Value Date   RETICCTPCT 1.6 06/09/2020   RBC 4.23 09/03/2020   No results found for: [facility], [facility] Lab Results  Component Value Date   IGGSERUM 1,085 02/09/2017   IGMSERUM 336 (H) 02/09/2017   No results found for: Crystal Lee, SPEI   Chemistry      Component Value Date/Time   NA 138 09/03/2020 1518   NA 139 02/25/2020 1627   K 4.3 09/03/2020 1518   CL 102 09/03/2020 1518   CO2 29 09/03/2020 1518   BUN 17 09/03/2020 1518   BUN 11 02/25/2020 1627   CREATININE 0.70 09/03/2020 1518      Component Value Date/Time   CALCIUM 10.4 (H) 09/03/2020 1518   ALKPHOS 92 09/03/2020 1518   AST 20 09/03/2020 1518   ALT 14 09/03/2020 1518   BILITOT 0.5 09/03/2020 1518       Impression and Plan: [name] is a very pleasant 87 yo caucasian female with history of  bilateral stage Ia breast cancer diagnosed back in 1998. She now has a new problem with respect to a locally advanced colon cancer.  She has stage IIIb disease.  She had a 3+ lymph nodes.  We do have her on adjuvant therapy with single agent Xeloda.  I thought this would be reasonable given her age  and overall performance status.  She will go ahead and start her third cycle of Xeloda.  I am planning for a total of 6 cycles of Xeloda.  We will plan to get her back in 3 more weeks.  I probably would not plan for any scans until May or June.  [name], MD 4/6/20224:10 PM

## 2020-09-04 LAB — CEA (IN HOUSE-CHCC): CEA (CHCC-In House): 2.19 ng/mL (ref 0.00–5.00)

## 2020-09-04 NOTE — Progress Notes (Signed)
Spoke with patient prior to her visit with MD. She is doing well but does have lots of questions about symptoms, timeline expectations, and how we are assessing her status. Answered all her questions to her satisfaction. Wrote many of my responses down so that she could take information with her. She wants to have a follow up with her GI physician. Message sent to that office about scheduling follow up.   Patient's daughter will be moving to the area this summer, and she is looking forward to this. Right now patient has little to no support in the area, so she verbalizes that having her daughter close will make her feel so much better.   Oncology Nurse Navigator Documentation  Oncology Nurse Navigator Flowsheets 09/03/2020  Confirmed Diagnosis Date -  Diagnosis Status -  Planned Course of Treatment -  Phase of Treatment -  Chemotherapy Pending- Reason: -  Chemotherapy Actual Start Date: -  Navigator Follow Up Date: 09/25/2020  Navigator Follow Up Reason: Follow-up Appointment  Navigator Location [facility]  Navigator Encounter Type Follow-up Appt  Telephone -  Treatment Initiated Date -  Patient Visit Type MedOnc  Treatment Phase Active Tx  Barriers/Navigation Needs Coordination of Care;Education;Anxiety;Transportation  Education -  Interventions Education;Psycho-Social Support  Acuity Level 2-Minimal Needs (1-2 Barriers Identified)  Coordination of Care -  Education Method Verbal;Teach-back;Written  Time Spent with Patient 45

## 2020-09-05 ENCOUNTER — Telehealth: Payer: Self-pay | Admitting: General Surgery

## 2020-09-05 NOTE — Telephone Encounter (Signed)
-----   Message from [place], DO sent at 09/05/2020  1:10 PM EDT ----- Regarding: RE: Follow Up Appointment Thanks Roselyn Reef. We will definitely get her set up with an appt to see me again. I will cc Olivia Mackie on this response to help out with future scheduling in the office.   Thanks!  Vito  ----- Message ----- From: Cordelia Poche, RN Sent: 09/05/2020  12:50 PM EDT To: Lavena Bullion, DO Subject: Follow Up Appointment                          Good afternoon,  I visited with this patient this week and she was wanting to know when she should be seeing you again. I read your note where you stated a 2-3 month follow up. She currently doesn't have anything scheduled with your office. Can you have your office call her to set up a follow up?  Also, for future scheduling needs, do you have a particular person I can reach out to so that I don't bother you?  Thank you  Engineer, water

## 2020-09-05 NOTE — Telephone Encounter (Signed)
I contacted Crystal Lee and scheduled her to see Dr Bryan Lemma on 10/09/2020 at 1:40pm.

## 2020-09-08 ENCOUNTER — Telehealth: Payer: Self-pay | Admitting: Hematology & Oncology

## 2020-09-08 NOTE — Telephone Encounter (Signed)
Appointments scheduled calendar printed & mailed per 4/7 los 

## 2020-09-15 ENCOUNTER — Other Ambulatory Visit: Payer: Self-pay

## 2020-09-15 ENCOUNTER — Encounter: Payer: Self-pay | Admitting: Registered Nurse

## 2020-09-15 ENCOUNTER — Encounter: Payer: Medicare Other | Attending: Physical Medicine & Rehabilitation | Admitting: Registered Nurse

## 2020-09-15 VITALS — BP 154/71 | HR 75 | Temp 97.9°F | Ht 66.0 in | Wt 216.0 lb

## 2020-09-15 DIAGNOSIS — M961 Postlaminectomy syndrome, not elsewhere classified: Secondary | ICD-10-CM | POA: Diagnosis not present

## 2020-09-15 DIAGNOSIS — M170 Bilateral primary osteoarthritis of knee: Secondary | ICD-10-CM | POA: Diagnosis not present

## 2020-09-15 DIAGNOSIS — S32000S Wedge compression fracture of unspecified lumbar vertebra, sequela: Secondary | ICD-10-CM | POA: Insufficient documentation

## 2020-09-15 DIAGNOSIS — G894 Chronic pain syndrome: Secondary | ICD-10-CM | POA: Diagnosis not present

## 2020-09-15 DIAGNOSIS — Z79899 Other long term (current) drug therapy: Secondary | ICD-10-CM | POA: Insufficient documentation

## 2020-09-15 DIAGNOSIS — M7061 Trochanteric bursitis, right hip: Secondary | ICD-10-CM | POA: Diagnosis not present

## 2020-09-15 DIAGNOSIS — R202 Paresthesia of skin: Secondary | ICD-10-CM | POA: Insufficient documentation

## 2020-09-15 DIAGNOSIS — M2550 Pain in unspecified joint: Secondary | ICD-10-CM | POA: Insufficient documentation

## 2020-09-15 DIAGNOSIS — Z5181 Encounter for therapeutic drug level monitoring: Secondary | ICD-10-CM | POA: Insufficient documentation

## 2020-09-15 DIAGNOSIS — M7062 Trochanteric bursitis, left hip: Secondary | ICD-10-CM | POA: Diagnosis not present

## 2020-09-15 MED ORDER — HYDROCODONE-ACETAMINOPHEN 10-325 MG PO TABS: 1.0000 | ORAL_TABLET | Freq: Four times a day (QID) | ORAL | 0 refills | Status: DC | PRN

## 2020-09-15 NOTE — Progress Notes (Signed)
Subjective:    Patient ID: Crystal Lee, female    DOB: 10-Jan-1934, 87 y.o.   MRN: [id number]  HPI: Crystal Lee is a 87 y.o. female who returns for follow up appointment for chronic pain and medication refill. She states her pain is located in her lower back, bilateral hips, and bilateral knee pain. Also reports generalized joint pain. She rates her pain 5. Her current exercise regime is walking short distances with her walker.  Crystal Lee Morphine equivalent is 40.00 MME.  Last Oral Swab was Performed on 08/11/2020, it was consistent.    Pain Inventory Average Pain 5 Pain Right Now 5 My pain is sharp, burning and aching  In the last 24 hours, has pain interfered with the following? General activity 5 Relation with others 3 Enjoyment of life 5 What TIME of day is your pain at its worst? daytime and evening Sleep (in general) Poor  Pain is worse with: walking, bending, standing and some activites Pain improves with: rest, heat/ice and medication Relief from Meds: 3  Family History  Problem Relation Age of Onset  . Other Mother        complications from flu  . Other Father        unsure of cause  . Colon cancer Neg Hx    Social History   Socioeconomic History  . Marital status: Widowed    Spouse name: Not on file  . Number of children: 3  . Years of education: 16 years  . Highest education level: Not on file  Occupational History  . Occupation: Retired  Tobacco Use  . Smoking status: Former Smoker    Packs/day: 0.50    Years: 20.00    Pack years: 10.00    Quit date: 02/03/1976    Years since quitting: 44.6  . Smokeless tobacco: Never Used  Vaping Use  . Vaping Use: Never used  Substance and Sexual Activity  . Alcohol use: No  . Drug use: No  . Sexual activity: Yes    Birth control/protection: Post-menopausal  Other Topics Concern  . Not on file  Social History Narrative   Lives at home with husband.   Right-handed.   No caffeine use.   Social  Determinants of Health   Financial Resource Strain: Not on file  Food Insecurity: Not on file  Transportation Needs: Not on file  Physical Activity: Not on file  Stress: Not on file  Social Connections: Not on file   Past Surgical History:  Procedure Laterality Date  . [place] STUDY N/A 06/21/2018   Procedure: [place] STUDY;  Surgeon: Lavena Bullion, DO;  [facility];  Service: Gastroenterology;  Laterality: N/A;  with impedance  . ABDOMINAL HYSTERECTOMY  2010  . ANAL RECTAL MANOMETRY N/A 09/19/2019   Procedure: ANO RECTAL MANOMETRY;  Surgeon: Mauri Pole, MD;  [facility];  Service: Endoscopy;  Laterality: N/A;  . APPENDECTOMY  1949  . [place]  . BIOPSY  06/10/2020   Procedure: BIOPSY;  Surgeon: Lavena Bullion, DO;  [facility];  Service: Gastroenterology;;  EGD and COLON  . BREAST SURGERY    . CATARACT EXTRACTION Bilateral 2011  . CHOLECYSTECTOMY    . COLON RESECTION N/A 06/12/2020   Procedure: LAPAROSCOPIC COLON RESECTION;  Surgeon: [name], MD;  [facility];  Service: General;  Laterality: N/A;  . COLONOSCOPY WITH PROPOFOL N/A 06/10/2020   Procedure: COLONOSCOPY WITH PROPOFOL;  Surgeon: Lavena Bullion,  DO;  [facility];  Service: Gastroenterology;  Laterality: N/A;  . ESOPHAGEAL MANOMETRY N/A 06/21/2018   Procedure: ESOPHAGEAL MANOMETRY (EM);  Surgeon: Lavena Bullion, DO;  [facility];  Service: Gastroenterology;  Laterality: N/A;  . ESOPHAGOGASTRODUODENOSCOPY (EGD) WITH PROPOFOL N/A 06/10/2020   Procedure: ESOPHAGOGASTRODUODENOSCOPY (EGD) WITH PROPOFOL;  Surgeon: Lavena Bullion, DO;  [facility];  Service: Gastroenterology;  Laterality: N/A;  . GALLBLADDER SURGERY  2015  . JOINT REPLACEMENT     RT TOTAL HIP / RT TOTAL KNEE  . MASTECTOMY  1998   BILATERAL   . [place] IMPEDANCE STUDY  06/21/2018   Procedure: [place] IMPEDANCE STUDY;  Surgeon: Lavena Bullion, DO;   [facility];  Service: Gastroenterology;;  . POLYPECTOMY  06/10/2020   Procedure: POLYPECTOMY;  Surgeon: Lavena Bullion, DO;  [facility];  Service: Gastroenterology;;  . SKIN CANCER EXCISION  2016   RT SIDE OF NOSE  . SUBMUCOSAL TATTOO INJECTION  06/10/2020   Procedure: SUBMUCOSAL TATTOO INJECTION;  Surgeon: Lavena Bullion, DO;  [facility];  Service: Gastroenterology;;  . TONSILLECTOMY  1953  . TOTAL HIP ARTHROPLASTY  2010   RIGHT  . TOTAL HIP ARTHROPLASTY Left 05/09/2015   Procedure: LEFT TOTAL HIP ARTHROPLASTY ANTERIOR APPROACH;  Surgeon: Gaynelle Arabian, MD;  [facility];  Service: Orthopedics;  Laterality: Left;  . TOTAL KNEE ARTHROPLASTY  2001  . TUMOR REMOVAL  2012   ABDOMINAL - NON CANCEROUS   Past Surgical History:  Procedure Laterality Date  . [place] STUDY N/A 06/21/2018   Procedure: [place] STUDY;  Surgeon: Lavena Bullion, DO;  [facility];  Service: Gastroenterology;  Laterality: N/A;  with impedance  . ABDOMINAL HYSTERECTOMY  2010  . ANAL RECTAL MANOMETRY N/A 09/19/2019   Procedure: ANO RECTAL MANOMETRY;  Surgeon: Mauri Pole, MD;  [facility];  Service: Endoscopy;  Laterality: N/A;  . APPENDECTOMY  1949  . [place]  . BIOPSY  06/10/2020   Procedure: BIOPSY;  Surgeon: Lavena Bullion, DO;  [facility];  Service: Gastroenterology;;  EGD and COLON  . BREAST SURGERY    . CATARACT EXTRACTION Bilateral 2011  . CHOLECYSTECTOMY    . COLON RESECTION N/A 06/12/2020   Procedure: LAPAROSCOPIC COLON RESECTION;  Surgeon: [name], MD;  [facility];  Service: General;  Laterality: N/A;  . COLONOSCOPY WITH PROPOFOL N/A 06/10/2020   Procedure: COLONOSCOPY WITH PROPOFOL;  Surgeon: Lavena Bullion, DO;  [facility];  Service: Gastroenterology;  Laterality: N/A;  . ESOPHAGEAL MANOMETRY N/A 06/21/2018   Procedure: ESOPHAGEAL MANOMETRY (EM);  Surgeon: Lavena Bullion, DO;  [facility];  Service: Gastroenterology;  Laterality: N/A;  . ESOPHAGOGASTRODUODENOSCOPY (EGD) WITH PROPOFOL N/A 06/10/2020   Procedure: ESOPHAGOGASTRODUODENOSCOPY (EGD) WITH PROPOFOL;  Surgeon: Lavena Bullion, DO;  [facility];  Service: Gastroenterology;  Laterality: N/A;  . GALLBLADDER SURGERY  2015  . JOINT REPLACEMENT     RT TOTAL HIP / RT TOTAL KNEE  . MASTECTOMY  1998   BILATERAL   . [place] IMPEDANCE STUDY  06/21/2018   Procedure: [place] IMPEDANCE STUDY;  Surgeon: Lavena Bullion, DO;  [facility];  Service: Gastroenterology;;  . POLYPECTOMY  06/10/2020   Procedure: POLYPECTOMY;  Surgeon: Lavena Bullion, DO;  [facility];  Service: Gastroenterology;;  . SKIN CANCER EXCISION  2016   RT SIDE OF NOSE  . SUBMUCOSAL TATTOO INJECTION  06/10/2020  Procedure: SUBMUCOSAL TATTOO INJECTION;  Surgeon: Lavena Bullion, DO;  [facility];  Service: Gastroenterology;;  . TONSILLECTOMY  1953  . TOTAL HIP ARTHROPLASTY  2010   RIGHT  . TOTAL HIP ARTHROPLASTY Left 05/09/2015   Procedure: LEFT TOTAL HIP ARTHROPLASTY ANTERIOR APPROACH;  Surgeon: Gaynelle Arabian, MD;  [facility];  Service: Orthopedics;  Laterality: Left;  . TOTAL KNEE ARTHROPLASTY  2001  . TUMOR REMOVAL  2012   ABDOMINAL - NON CANCEROUS   Past Medical History:  Diagnosis Date  . Arthritis   . Cancer (HCC)    HX BREAST CANCER/ SKIN CANCER  . Complication of anesthesia    N/V WITH MORPHINE  . Difficulty sleeping   . Fractured hip ([place])    LEFT - AUG 2016  . GERD (gastroesophageal reflux disease)   . Hyperlipidemia   . Hypertension   . Melanoma ([place])   . Neuropathy   . Nocturia   . Osteopenia   . 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 PONV (postoperative nausea and vomiting)    PT STATES MORPHINE CAUSED N/V  . Rosacea   . Sleep apnea   . Stage 1 breast cancer, ER+, right (HCC) 07/04/2017   Temp 97.9 F (36.6 C)   Ht 5\' 6"  (1.676 m)    Wt 216 lb (98 kg)   BMI 34.86 kg/m   Opioid Risk Score:   Fall Risk Score:  `1  Depression screen PHQ 2/9  Depression screen [facility] 2/9 08/11/2020 05/20/2020 12/24/2019 11/22/2018 09/28/2018 06/02/2018 04/10/2018  Decreased Interest 0 0 0 0 0 0 0  Down, Depressed, Hopeless 0 0 0 0 0 0 0  PHQ - 2 Score 0 0 0 0 0 0 0  Altered sleeping - - - - - - -  Tired, decreased energy - - - - - - -  Change in appetite - - - - - - -  Feeling bad or failure about yourself  - - - - - - -  Trouble concentrating - - - - - - -  Moving slowly or fidgety/restless - - - - - - -  Suicidal thoughts - - - - - - -  PHQ-9 Score - - - - - - -  Difficult doing work/chores - - - - - - -    Review of Systems  Constitutional: Negative.   HENT: Negative.   Eyes: Negative.   Respiratory: Negative.   Cardiovascular: Negative.   Gastrointestinal: Negative.   Endocrine: Negative.   Genitourinary: Negative.   Musculoskeletal: Positive for arthralgias, back pain and gait problem.  Skin: Negative.   Allergic/Immunologic: Negative.   Hematological: Negative.   Psychiatric/Behavioral: Negative.   All other systems reviewed and are negative.      Objective:   Physical Exam Vitals and nursing note reviewed.  Constitutional:      Appearance: Normal appearance.  Cardiovascular:     Rate and Rhythm: Normal rate and regular rhythm.     Pulses: Normal pulses.     Heart sounds: Normal heart sounds.  Pulmonary:     Effort: Pulmonary effort is normal.     Breath sounds: Normal breath sounds.  Musculoskeletal:     Cervical back: Normal range of motion and neck supple.     Comments: Normal Muscle Bulk and Muscle Testing Reveals:  Upper Extremities: Full ROM and Muscle Strength 5/5  Lumbar Paraspinal Tenderness: L-3-L-5 Lower Extremities: Full ROM and Muscle Strength 5/5 Arises from chair slowly using walker for  support Narrow Based Gait   Skin:    General: Skin is warm and dry.  Neurological:     Mental Status: She  is alert and oriented to person, place, and time.  Psychiatric:        Mood and Affect: Mood normal.        Behavior: Behavior normal.           Assessment & Plan:  1. Chronic Bilateral Leg pain: Paresthesia: Continue to Monitor.09/15/2020 2.Paresthesia /Lumbar Radiculitis:Ms. Harrellhasweaned herself off the Lyrica due to daytime drowsiness. We will continue to monitor.09/15/2020 3. Pain of Left Wrist/:No complaints today.S/P Carpal Tunnel Release on 06/03/2017 by Dr. Ellene Route. Dr. Ellene Route Following.09/15/2020. 4. Fracture of superior pubic ramus.Dr. AluisioFollowing.Continue to monitor.09/15/2020. 5.BilateralKnee OA: Continue Voltaren Gel.Continue to monitor. Orthopedist following.09/15/2020 6. Polyarthralgia: Continue to alternate with heat and ice therapy. Continue current medication regime. Continue to monitor.09/15/2020. 7. Chronic Pain Syndrome:Refilled::Hydrocodone10/325 mg one tablet every 6 hours as needed for moderate pain #120.09/15/2020. 8. Lumbar Compression Fracture L2 and L3: Ms. Terrance refused physical therapy.Continue with rest/ heat therapy. Continue to Monitor04/18/2022. 9. Muscle Spasm:ContinueFlexeril 5 mg at HS. Continue to monitor.09/15/2020.  F/U in 1 Month

## 2020-09-17 ENCOUNTER — Other Ambulatory Visit (HOSPITAL_COMMUNITY): Payer: Self-pay

## 2020-09-18 ENCOUNTER — Ambulatory Visit: Payer: Medicare Other | Attending: Internal Medicine

## 2020-09-18 ENCOUNTER — Other Ambulatory Visit (HOSPITAL_COMMUNITY): Payer: Self-pay

## 2020-09-18 DIAGNOSIS — Z23 Encounter for immunization: Secondary | ICD-10-CM

## 2020-09-18 MED FILL — Capecitabine Tab 500 MG: ORAL | 14 days supply | Qty: 112 | Fill #0 | Status: AC

## 2020-09-18 NOTE — Progress Notes (Signed)
   Covid-19 Vaccination Clinic  Name:  Camdyn Laden    MRN: [id number] DOB: 1933/07/03  09/18/2020  Ms. Tarazon was observed post Covid-19 immunization for 15 minutes without incident. She was provided with Vaccine Information Sheet and instruction to access the V-Safe system.   Ms. Defino was instructed to call 911 with any severe reactions post vaccine: Marland Kitchen Difficulty breathing  . Swelling of face and throat  . A fast heartbeat  . A bad rash all over body  . Dizziness and weakness   Immunizations Administered    Name Date Dose VIS Date Route   Moderna Covid-19 Booster Vaccine 09/18/2020 12:24 PM 0.25 mL 03/19/2020 Intramuscular   Manufacturer: Moderna   Lot: [id number]   [place]: [phone]

## 2020-09-19 NOTE — Progress Notes (Addendum)
[place] [facility] [place], [place], [place] [address] [phone]  Date:  09/22/2020   Name:  Crystal Lee   DOB:  February 22, 1934   MRN:  [id number]  PCP:  Darreld Mclean, MD    Chief Complaint: Hypertension, Hyperlipidemia (4 month follow up/), vaginal concern (Itching, burning, redness), and rhinorrhea   History of Present Illness:  Crystal Lee is a 87 y.o. very pleasant female patient who presents with the following:  Here today for a follow-up visit Last seen by myself in January of this year  History of breast cancer status post bilateral mastectomy, osteoporosis, hypertension, hyperlipidemia, postlaminectomy syndrome and diagnosed with colon cancer this past January   Last seen by oncology earlier his month - Dr Marin Olp Impression and Plan: [name] is a very pleasant 87 yo caucasian female with history of bilateral stage Ia breast cancer diagnosed back in 1998. She now has a new problem with respect to a locally advanced colon cancer.  She has stage IIIb disease.  She had a 3+ lymph nodes. We do have her on adjuvant therapy with single agent Xeloda.  I thought this would be reasonable given her age and overall performance status. She will go ahead and start her third cycle of Xeloda.  I am planning for a total of 6 cycles of Xeloda.  Also seen by PM&R earlier this month for her chronic leg pain She notes that she is feeling tired, but otherwise seems to be doing ok Her digestion is ok - she has been constipated recently  She did have a BM last night  Her GI has given her a stool softener suggestion   She notes some vaginal burning and irritation Itching, no discharge however  She did use diflucan in the past for similar sx  She also wonders if she has dryness, as the tissues are tight and unomfortable feeling She did use "premarin" in the past She is s/p hyst   She has some sort of eye infection- she is  seeing Dr Carolyn Stare at [facility] opthalmology  He last visit was maybe 6 weeks ago, she was not told to come back?  She notes that her sx involve a red eye that is "dreary looking," always the left eye.  She does show mild redness of the left lower lid.  I encouraged her to follow-up with her eye care professional  She always notes a runny nose as well, clear drainage.  This can be quite annoying No glaucoma history    Cardiology visit in March of this year  1. Hypertensive heart disease with chronic diastolic congestive heart failure ([place])   2. Dyslipidemia    PLAN:    In order of problems listed above: 1. She is improved at this time no volume overload not on a loop diuretic I have told her that she can use her judgment take a dose of furosemide as needed for edema. 2. BP at target continue her ARB 3. Continue fenofibrate  covid booster-encouraged her to have this done CMP and CBC on 4/6  Patient Active Problem List   Diagnosis Date Noted  . Sleep apnea   . Rosacea   . PONV (postoperative nausea and vomiting)   . Nocturia   . Neuropathy   . Melanoma ([place])   . Hypertension   . Hyperlipidemia   . Fractured hip ([place])   . Difficulty sleeping   . Complication of anesthesia   .  Cancer ([place])   . Arthritis   . Malignant neoplasm of descending colon ([place])   . Gastric polyps   . Colonic mass   . Diverticulosis of colon without hemorrhage   . Adenomatous polyp of sigmoid colon   . Acute lower GI bleeding 06/09/2020  . Lower GI bleed 06/08/2020  . Acute blood loss anemia 06/08/2020  . HTN (hypertension) 06/08/2020  . Constipation   . Encounter for orthopedic follow-up care 06/18/2019  . Urinary tract infection 04/20/2019  . Acute pain of right wrist 03/28/2019  . Tenosynovitis, wrist 03/28/2019  . Acquired trigger finger of right little finger 01/15/2019  . Carpal tunnel syndrome of right wrist 01/15/2019  . Aftercare 08/17/2018  . Lumbar post-laminectomy syndrome 07/31/2018  .  Heartburn   . Fracture of superior pubic ramus ([place]) 06/16/2018  . Radial styloid tenosynovitis of left hand 05/16/2018  . Pain of left hand 04/20/2018  . Osteopenia 04/15/2018  . Dyslipidemia 02/02/2018  . GERD (gastroesophageal reflux disease) 02/02/2018  . Trochanteric bursitis of left hip 12/23/2017  . Primary osteoarthritis of left knee 10/17/2017  . History of right knee joint replacement 10/17/2017  . Pain in both lower extremities 08/02/2017  . Stage 1 breast cancer, ER+, right ([place]) 07/04/2017  . 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 Pain in right hand 06/10/2017  . Trigger finger of right hand 06/10/2017  . Paresthesia 02/09/2017  . Low back pain 02/09/2017  . Gait abnormality 02/09/2017  . OA (osteoarthritis) of hip 05/09/2015    Past Medical History:  Diagnosis Date  . Arthritis   . Cancer (HCC)    HX BREAST CANCER/ SKIN CANCER  . Complication of anesthesia    N/V WITH MORPHINE  . Difficulty sleeping   . Fractured hip ([place])    LEFT - AUG 2016  . GERD (gastroesophageal reflux disease)   . Hyperlipidemia   . Hypertension   . Melanoma ([place])   . Neuropathy   . Nocturia   . Osteopenia   . 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 PONV (postoperative nausea and vomiting)    PT STATES MORPHINE CAUSED N/V  . Rosacea   . Sleep apnea   . Stage 1 breast cancer, ER+, right ([place]) 07/04/2017    Past Surgical History:  Procedure Laterality Date  . [place] STUDY N/A 06/21/2018   Procedure: [place] STUDY;  Surgeon: Lavena Bullion, DO;  [facility];  Service: Gastroenterology;  Laterality: N/A;  with impedance  . ABDOMINAL HYSTERECTOMY  2010  . ANAL RECTAL MANOMETRY N/A 09/19/2019   Procedure: ANO RECTAL MANOMETRY;  Surgeon: Mauri Pole, MD;  [facility];  Service: Endoscopy;  Laterality: N/A;  . APPENDECTOMY  1949  . [place]  . BIOPSY  06/10/2020   Procedure: BIOPSY;  Surgeon: Lavena Bullion, DO;  [facility];  Service: Gastroenterology;;  EGD and COLON  . BREAST SURGERY    . CATARACT EXTRACTION Bilateral 2011  . CHOLECYSTECTOMY    . COLON RESECTION N/A 06/12/2020   Procedure: LAPAROSCOPIC COLON RESECTION;  Surgeon: [name], MD;  [facility];  Service: General;  Laterality: N/A;  . COLONOSCOPY WITH PROPOFOL N/A 06/10/2020   Procedure: COLONOSCOPY WITH PROPOFOL;  Surgeon: Lavena Bullion, DO;  [facility];  Service: Gastroenterology;  Laterality: N/A;  . ESOPHAGEAL MANOMETRY N/A 06/21/2018   Procedure: ESOPHAGEAL MANOMETRY (EM);  Surgeon: Lavena Bullion, DO;  [facility];  Service: Gastroenterology;  Laterality: N/A;  . ESOPHAGOGASTRODUODENOSCOPY (EGD) WITH PROPOFOL N/A 06/10/2020   Procedure: ESOPHAGOGASTRODUODENOSCOPY (EGD) WITH PROPOFOL;  Surgeon: Lavena Bullion, DO;  [facility];  Service: Gastroenterology;  Laterality: N/A;  . GALLBLADDER SURGERY  2015  . JOINT REPLACEMENT     RT TOTAL HIP / RT TOTAL KNEE  . MASTECTOMY  1998   BILATERAL   . [place] IMPEDANCE STUDY  06/21/2018   Procedure: [place] IMPEDANCE STUDY;  Surgeon: Lavena Bullion, DO;  [facility];  Service: Gastroenterology;;  . POLYPECTOMY  06/10/2020   Procedure: POLYPECTOMY;  Surgeon: Lavena Bullion, DO;  [facility];  Service: Gastroenterology;;  . SKIN CANCER EXCISION  2016   RT SIDE OF NOSE  . SUBMUCOSAL TATTOO INJECTION  06/10/2020   Procedure: SUBMUCOSAL TATTOO INJECTION;  Surgeon: Lavena Bullion, DO;  [facility];  Service: Gastroenterology;;  . TONSILLECTOMY  1953  . TOTAL HIP ARTHROPLASTY  2010   RIGHT  . TOTAL HIP ARTHROPLASTY Left 05/09/2015   Procedure: LEFT TOTAL HIP ARTHROPLASTY ANTERIOR APPROACH;  Surgeon: Gaynelle Arabian, MD;  [facility];  Service: Orthopedics;  Laterality: Left;  . TOTAL KNEE ARTHROPLASTY  2001  . TUMOR REMOVAL  2012   ABDOMINAL - NON CANCEROUS    Social History    Tobacco Use  . Smoking status: Former Smoker    Packs/day: 0.50    Years: 20.00    Pack years: 10.00    Quit date: 02/03/1976    Years since quitting: 44.6  . Smokeless tobacco: Never Used  Vaping Use  . Vaping Use: Never used  Substance Use Topics  . Alcohol use: No  . Drug use: No    Family History  Problem Relation Age of Onset  . Other Mother        complications from flu  . Other Father        unsure of cause  . Colon cancer Neg Hx     Allergies  Allergen Reactions  . Darvon [Propoxyphene] Nausea And Vomiting and Palpitations    Darvocet Causes Sweats  . Adhesive [Tape] Rash  . Lisinopril Cough  . Morphine And Related Nausea And Vomiting    Medication list has been reviewed and updated.  Current Outpatient Medications on File Prior to Visit  Medication Sig Dispense Refill  . capecitabine (XELODA) 500 MG tablet TAKE 4 TABLETS BY MOUTH 2 TIMES DAILY AFTER A MEAL. TAKE FOR 14 DAYS, THEN HOLD FOR 7 DAYS. REPEAT EVERY 21 DAYS. 112 tablet 5  . cyclobenzaprine (FLEXERIL) 5 MG tablet Take 1 tablet (5 mg total) by mouth at bedtime as needed for muscle spasms. 90 tablet 0  . fenofibrate (TRICOR) 48 MG tablet Take 1 tablet (48 mg total) by mouth daily. 90 tablet 3  . HYDROcodone-acetaminophen (NORCO) 10-325 MG tablet Take 1 tablet by mouth every 6 (six) hours as needed. 120 tablet 0  . losartan (COZAAR) 25 MG tablet Take 1 tablet (25 mg total) by mouth daily. 90 tablet 3  . Multiple Vitamins-Minerals (MULTIVITAMIN ADULTS) TABS Take 1 tablet by mouth daily.    . naloxegol oxalate (MOVANTIK) 12.5 MG TABS tablet Take 1 tablet (12.5 mg total) by mouth daily. 90 tablet 3  . promethazine (PHENERGAN) 12.5 MG tablet Take 1 tablet (12.5 mg total) by mouth every 6 (six) hours as needed for nausea or vomiting. 90 tablet 3   No current facility-administered medications on file prior to visit.    Review of Systems:  As per HPI- otherwise negative.   Physical Examination: Vitals:    09/22/20 1301  BP: 120/72  Pulse: 63  Resp: 15  Temp: (!) 97.1 F (36.2 C)  SpO2: 98%   Vitals:   09/22/20 1301  Weight: 218 lb (98.9 kg)  Height: 5\' 6"  (1.676 m)   Body mass index is 35.19 kg/m. Ideal Body Weight: Weight in (lb) to have BMI = 25: 154.6  GEN: no acute distress.  Obese, looks well and her normal self  HEENT: Atraumatic, Normocephalic.    CV: RRR, No M/G/R. No JVD. No thrill. No extra heart sounds. PULM: CTA B, no wheezes, crackles, rhonchi. No retractions. No resp. distress. No accessory muscle use. ABD: S, NT, ND, +BS. No rebound. No HSM. EXTR: No c/c/e PSYCH: Normally interactive. Conversant.  Limited pelvic exam performed, no speculum used.  Exam is consistent with age-related vaginal dryness  Assessment and Plan: Vitamin D deficiency - Plan: VITAMIN D 25 Hydroxy (Vit-D Deficiency, Fractures)  Essential hypertension - Plan: losartan (COZAAR) 25 MG tablet  Dyslipidemia - Plan: fenofibrate (TRICOR) 48 MG tablet  Abnormal TSH - Plan: TSH  Vaginal itching - Plan: fluconazole (DIFLUCAN) 150 MG tablet, Cervicovaginal ancillary only( [place])  PND (post-nasal drip) - Plan: ipratropium (ATROVENT) 0.03 % nasal spray  Acquired hypothyroidism - Plan: TSH  Here today for follow-up visit.  She has noted vaginal itching and irritation.  I suspect this is related to age-related vaginal dryness, will touch base with her oncologist and assuming okay with him we will start her on a low-dose vaginal cream. We will also give her Diflucan now in case yeast, swab is  pending Follow-up on labs- Will plan further follow- up pending labs. Atrovent nasal to use as needed for nasal drainage Blood pressures under control   This visit occurred during the SARS-CoV-2 public health emergency.  Safety protocols were in place, including screening questions prior to the visit, additional usage of staff PPE, and extensive cleaning of exam room while observing appropriate contact  time as indicated for disinfecting solutions.    Signed Lamar Blinks, MD  I heard back from her oncologist, Dr. Princess Bruins to try topical vaginal estrogen  Will order for her- called to let her know   addnd 4/26- received labs as below, message to pt Results for orders placed or performed in visit on 09/22/20  VITAMIN D 25 Hydroxy (Vit-D Deficiency, Fractures)  Result Value Ref Range   VITD 34.79 30.00 - 100.00 ng/mL  TSH  Result Value Ref Range   TSH 1.95 0.35 - 4.50 uIU/mL

## 2020-09-19 NOTE — Patient Instructions (Addendum)
Good to see you again today!   I will be in touch with your labs as soon as possible I sent in prescriptions for your blood pressure medicine and cholesterol medicine to your mail away pharmacy We sent a Diflucan pill for possible yeast infection, and a nasal spray for your runny nose to your local pharmacy  Let me touch base with Dr. Marin Olp about using a vaginal estrogen for you for dryness

## 2020-09-22 ENCOUNTER — Other Ambulatory Visit (HOSPITAL_COMMUNITY): Payer: Self-pay

## 2020-09-22 ENCOUNTER — Other Ambulatory Visit: Payer: Self-pay

## 2020-09-22 ENCOUNTER — Ambulatory Visit (INDEPENDENT_AMBULATORY_CARE_PROVIDER_SITE_OTHER): Payer: Medicare Other | Admitting: Family Medicine

## 2020-09-22 ENCOUNTER — Other Ambulatory Visit (HOSPITAL_COMMUNITY)
Admission: RE | Admit: 2020-09-22 | Discharge: 2020-09-22 | Disposition: A | Discharge status: Home / Self Care | Payer: Medicare Other | Source: Ambulatory Visit | Attending: Family Medicine | Admitting: Family Medicine

## 2020-09-22 ENCOUNTER — Encounter: Payer: Self-pay | Admitting: Family Medicine

## 2020-09-22 ENCOUNTER — Other Ambulatory Visit (HOSPITAL_BASED_OUTPATIENT_CLINIC_OR_DEPARTMENT_OTHER): Payer: Self-pay

## 2020-09-22 VITALS — BP 120/72 | HR 63 | Temp 97.1°F | Resp 15 | Ht 66.0 in | Wt 218.0 lb

## 2020-09-22 DIAGNOSIS — N898 Other specified noninflammatory disorders of vagina: Secondary | ICD-10-CM

## 2020-09-22 DIAGNOSIS — I10 Essential (primary) hypertension: Secondary | ICD-10-CM

## 2020-09-22 DIAGNOSIS — R0982 Postnasal drip: Secondary | ICD-10-CM | POA: Diagnosis not present

## 2020-09-22 DIAGNOSIS — E785 Hyperlipidemia, unspecified: Secondary | ICD-10-CM | POA: Diagnosis not present

## 2020-09-22 DIAGNOSIS — E559 Vitamin D deficiency, unspecified: Secondary | ICD-10-CM | POA: Diagnosis not present

## 2020-09-22 DIAGNOSIS — E039 Hypothyroidism, unspecified: Secondary | ICD-10-CM

## 2020-09-22 DIAGNOSIS — R7989 Other specified abnormal findings of blood chemistry: Secondary | ICD-10-CM

## 2020-09-22 MED ORDER — FLUCONAZOLE 150 MG PO TABS: 150.0000 mg | ORAL_TABLET | Freq: Once | ORAL | 0 refills | Status: AC

## 2020-09-22 MED ORDER — FENOFIBRATE 48 MG PO TABS: 48.0000 mg | ORAL_TABLET | Freq: Every day | ORAL | 3 refills | Status: DC

## 2020-09-22 MED ORDER — LOSARTAN POTASSIUM 25 MG PO TABS: 25.0000 mg | ORAL_TABLET | Freq: Every day | ORAL | 3 refills | Status: DC

## 2020-09-22 MED ORDER — IPRATROPIUM BROMIDE 0.03 % NA SOLN: 2.0000 | Freq: Four times a day (QID) | NASAL | 12 refills | Status: DC | PRN

## 2020-09-22 MED ORDER — ESTRADIOL 0.1 MG/GM VA CREA: TOPICAL_CREAM | VAGINAL | 12 refills | Status: DC

## 2020-09-22 MED ORDER — MODERNA COVID-19 VACCINE 100 MCG/0.5ML IM SUSP
INTRAMUSCULAR | 0 refills | Status: DC
Start: 2020-09-18 — End: 2020-09-22
  Filled 2020-09-22: qty 0.25, 1d supply, fill #0

## 2020-09-22 NOTE — Addendum Note (Signed)
Addended by: Lamar Blinks C on: 09/22/2020 04:56 PM   Modules accepted: Orders

## 2020-09-23 ENCOUNTER — Encounter: Payer: Self-pay | Admitting: Family Medicine

## 2020-09-23 LAB — VITAMIN D 25 HYDROXY (VIT D DEFICIENCY, FRACTURES): VITD: 34.79 ng/mL (ref 30.00–100.00)

## 2020-09-23 LAB — TSH: TSH: 1.95 u[IU]/mL (ref 0.35–4.50)

## 2020-09-24 ENCOUNTER — Encounter: Payer: Self-pay | Admitting: Family Medicine

## 2020-09-24 LAB — CERVICOVAGINAL ANCILLARY ONLY
Bacterial Vaginitis (gardnerella): NEGATIVE
Candida Glabrata: NEGATIVE
Candida Vaginitis: NEGATIVE
Comment: NEGATIVE
Comment: NEGATIVE
Comment: NEGATIVE

## 2020-09-25 ENCOUNTER — Inpatient Hospital Stay: Payer: Medicare Other

## 2020-09-25 ENCOUNTER — Ambulatory Visit: Payer: Medicare Other | Admitting: Family Medicine

## 2020-09-25 ENCOUNTER — Inpatient Hospital Stay (HOSPITAL_BASED_OUTPATIENT_CLINIC_OR_DEPARTMENT_OTHER): Payer: Medicare Other | Admitting: Hematology & Oncology

## 2020-09-25 ENCOUNTER — Other Ambulatory Visit: Payer: Self-pay

## 2020-09-25 ENCOUNTER — Encounter: Payer: Self-pay | Admitting: *Deleted

## 2020-09-25 ENCOUNTER — Encounter: Payer: Self-pay | Admitting: Hematology & Oncology

## 2020-09-25 VITALS — BP 135/65 | HR 70 | Temp 98.2°F | Resp 20 | Wt 218.8 lb

## 2020-09-25 DIAGNOSIS — Z170 Estrogen receptor positive status [ER+]: Secondary | ICD-10-CM | POA: Diagnosis not present

## 2020-09-25 DIAGNOSIS — Z79899 Other long term (current) drug therapy: Secondary | ICD-10-CM | POA: Diagnosis not present

## 2020-09-25 DIAGNOSIS — C186 Malignant neoplasm of descending colon: Secondary | ICD-10-CM

## 2020-09-25 DIAGNOSIS — H16202 Unspecified keratoconjunctivitis, left eye: Secondary | ICD-10-CM

## 2020-09-25 DIAGNOSIS — M810 Age-related osteoporosis without current pathological fracture: Secondary | ICD-10-CM | POA: Diagnosis not present

## 2020-09-25 DIAGNOSIS — H538 Other visual disturbances: Secondary | ICD-10-CM | POA: Diagnosis not present

## 2020-09-25 DIAGNOSIS — C184 Malignant neoplasm of transverse colon: Secondary | ICD-10-CM | POA: Diagnosis not present

## 2020-09-25 DIAGNOSIS — C50911 Malignant neoplasm of unspecified site of right female breast: Secondary | ICD-10-CM | POA: Diagnosis not present

## 2020-09-25 DIAGNOSIS — Z853 Personal history of malignant neoplasm of breast: Secondary | ICD-10-CM | POA: Diagnosis not present

## 2020-09-25 LAB — CMP (CANCER CENTER ONLY)
ALT: 11 U/L (ref 0–44)
AST: 19 U/L (ref 15–41)
Albumin: 4.2 g/dL (ref 3.5–5.0)
Alkaline Phosphatase: 74 U/L (ref 38–126)
Anion gap: 5 (ref 5–15)
BUN: 14 mg/dL (ref 8–23)
CO2: 31 mmol/L (ref 22–32)
Calcium: 10.1 mg/dL (ref 8.9–10.3)
Chloride: 102 mmol/L (ref 98–111)
Creatinine: 0.64 mg/dL (ref 0.44–1.00)
GFR, Estimated: >60 mL/min (ref >60)
Glucose, Bld: 100 mg/dL — ABNORMAL HIGH (ref 70–99)
Potassium: 4.3 mmol/L (ref 3.5–5.1)
Sodium: 138 mmol/L (ref 135–145)
Total Bilirubin: 0.6 mg/dL (ref 0.3–1.2)
Total Protein: 6.9 g/dL (ref 6.5–8.1)

## 2020-09-25 LAB — CBC WITH DIFFERENTIAL (CANCER CENTER ONLY)
Abs Immature Granulocytes: 0.01 10*3/uL (ref 0.00–0.07)
Basophils Absolute: 0 10*3/uL (ref 0.0–0.1)
Basophils Relative: 0 %
Eosinophils Absolute: 0.4 10*3/uL (ref 0.0–0.5)
Eosinophils Relative: 6 %
HCT: 38.4 % (ref 36.0–46.0)
Hemoglobin: 12.5 g/dL (ref 12.0–15.0)
Immature Granulocytes: 0 %
Lymphocytes Relative: 36 %
Lymphs Abs: 2.4 10*3/uL (ref 0.7–4.0)
MCH: 31.3 pg (ref 26.0–34.0)
MCHC: 32.6 g/dL (ref 30.0–36.0)
MCV: 96 fL (ref 80.0–100.0)
Monocytes Absolute: 0.6 10*3/uL (ref 0.1–1.0)
Monocytes Relative: 10 %
Neutro Abs: 3.1 10*3/uL (ref 1.7–7.7)
Neutrophils Relative %: 48 %
Platelet Count: 108 10*3/uL — ABNORMAL LOW (ref 150–400)
RBC: 4 MIL/uL (ref 3.87–5.11)
RDW: 18.9 % — ABNORMAL HIGH (ref 11.5–15.5)
WBC Count: 6.5 10*3/uL (ref 4.0–10.5)
nRBC: 0 % (ref 0.0–0.2)

## 2020-09-25 NOTE — Progress Notes (Signed)
Hematology and Oncology Follow Up Visit  Crystal Lee [id number] 09/30/1933 87 y.o. 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 Year -- next dose on 11/2020   Interim History:  Ms. Boberg is here today for follow-up.  So far, she is doing okay.  No new problem she is having says she has a lot of irritation in the left eye.  She says she has had this for a year.  Not sure exactly what has been going on with this.  She is seeing ophthalmology.  Says she has an infection.  They are given her eyedrops.  This really has not helped.  She would like to see another ophthalmologist.  I am not sure how to go about doing this but we will see about making a referral.  I will think any of the irritation is from the Xeloda.  Only her left eye is involved.  She has had cataract surgery which was 12 years ago.  She has not noted any Discharge from the left eye.  Her vision is little bit blurred in the left eye.  There is been no issues with her hands or feet.  She has had no diarrhea.  She has had no nausea or vomiting.  She really is doing nicely with the Xeloda.  Overall, her performance status is [name].      Medications:  Allergies as of 09/25/2020      Reactions   Darvon [propoxyphene] Nausea And Vomiting, Palpitations   Darvocet Causes Sweats   Adhesive [tape] Rash   Lisinopril Cough   09/25/2020 Patient states she has never taken Lisinopril.   Morphine And Related Nausea And Vomiting      Medication List       Accurate as of September 25, 2020  3:25 PM. If you have any questions, ask your nurse or doctor.        STOP taking these medications   fluconazole 150 MG  tablet Commonly known as: DIFLUCAN Stopped by:  R , MD     TAKE these medications   capecitabine 500 MG tablet Commonly known as: XELODA TAKE 4 TABLETS BY MOUTH 2 TIMES DAILY AFTER A MEAL. TAKE FOR 14 DAYS, THEN HOLD FOR 7 DAYS. REPEAT EVERY 21 DAYS.   cyclobenzaprine 5 MG tablet Commonly known as: FLEXERIL Take 1 tablet (5 mg total) by mouth at bedtime as needed for muscle spasms.   estradiol 0.1 MG/GM vaginal cream Commonly known as: ESTRACE VAGINAL Apply 1gm vaginally 1-3x a week as needed for comfort   fenofibrate 48 MG tablet Commonly known as: Tricor Take 1 tablet (48 mg total) by mouth daily.   HYDROcodone-acetaminophen 10-325 MG tablet Commonly known as: NORCO Take 1 tablet by mouth every 6 (six) hours as needed.   ipratropium 0.03 % nasal spray Commonly known as: ATROVENT Place 2 sprays into both nostrils 4 (four) times daily as needed for rhinitis.   losartan 25 MG tablet Commonly known as: COZAAR Take 1 tablet (25 mg total) by mouth daily.   Multivitamin Adults Tabs Take 1 tablet by mouth daily.   naloxegol oxalate 12.5 MG Tabs tablet Commonly known as: MOVANTIK Take 1 tablet (12.5 mg total) by mouth daily.     promethazine 12.5 MG tablet Commonly known as: PHENERGAN Take 1 tablet (12.5 mg total) by mouth every 6 (six) hours as needed for nausea or vomiting.       Allergies:  Allergies  Allergen Reactions  . Darvon [Propoxyphene] Nausea And Vomiting and Palpitations    Darvocet Causes Sweats  . Adhesive [Tape] Rash  . Lisinopril Cough    09/25/2020 Patient states she has never taken Lisinopril.  . Morphine And Related Nausea And Vomiting    Past Medical History, Surgical history, Social history, and Family History were reviewed and updated.  Review of Systems: Review of Systems  Constitutional: Negative.   HENT: Negative.   Eyes: Negative.   Respiratory: Negative.   Cardiovascular: Positive for leg swelling.  Gastrointestinal:  Positive for constipation.  Genitourinary: Negative.   Musculoskeletal: Negative.   Skin: Negative.   Neurological: Negative.   Endo/Heme/Allergies: Negative.   Psychiatric/Behavioral: Negative.      Physical Exam:  weight is 218 lb 12.8 oz (99.2 kg). Her oral temperature is 98.2 F (36.8 C). Her blood pressure is 135/65 and her pulse is 70. Her respiration is 20 and oxygen saturation is 99%.   Wt Readings from Last 3 Encounters:  09/25/20 218 lb 12.8 oz (99.2 kg)  09/22/20 218 lb (98.9 kg)  09/15/20 216 lb (98 kg)    Physical Exam Vitals reviewed.  HENT:     Head: Normocephalic and atraumatic.  Eyes:     Pupils: Pupils are equal, round, and reactive to light.     Comments: Ocular exam shows irritation of the left eye.  This appears to be corneal irritation.  Pupil reacts appropriately.  There is no discharge.  Conjunctiva might be a little bit red.  She has good extraocular muscle movement.  Cardiovascular:     Rate and Rhythm: Normal rate and regular rhythm.     Heart sounds: Normal heart sounds.  Pulmonary:     Effort: Pulmonary effort is normal.     Breath sounds: Normal breath sounds.  Abdominal:     General: Bowel sounds are normal.     Palpations: Abdomen is soft.     Comments: Abdominal exam shows a soft abdomen.  She has well-healed laparoscopy scar.  She has no fluid wave.  There is no guarding or rebound tenderness.  There is no palpable liver or spleen tip.  Musculoskeletal:        General: No tenderness or deformity. Normal range of motion.     Cervical back: Normal range of motion.  Lymphadenopathy:     Cervical: No cervical adenopathy.  Skin:    General: Skin is warm and dry.     Findings: No erythema or rash.  Neurological:     Mental Status: She is alert and oriented to person, place, and time.  Psychiatric:        Behavior: Behavior normal.        Thought Content: Thought content normal.        Judgment: Judgment normal.   Or sooner for [place] is  again Lab Results  Component Value Date   WBC 6.5 09/25/2020   HGB 12.5 09/25/2020   HCT 38.4 09/25/2020   MCV 96.0 09/25/2020   PLT 108 (L) 09/25/2020   Lab Results  Component Value Date   FERRITIN 45 06/09/2020   IRON 49 06/09/2020   TIBC 320 06/09/2020   UIBC 271 06/09/2020   IRONPCTSAT 15 06/09/2020   Lab Results  Component Value Date   RETICCTPCT   1.6 06/09/2020   RBC 4.00 09/25/2020   No results found for: KPAFRELGTCHN, LAMBDASER, KAPLAMBRATIO Lab Results  Component Value Date   IGGSERUM 1,085 02/09/2017   IGMSERUM 336 (H) 02/09/2017   No results found for: TOTALPROTELP, ALBUMINELP, A1GS, A2GS, BETS, BETA2SER, GAMS, MSPIKE, SPEI   Chemistry      Component Value Date/Time   NA 138 09/25/2020 1415   NA 139 02/25/2020 1627   K 4.3 09/25/2020 1415   CL 102 09/25/2020 1415   CO2 31 09/25/2020 1415   BUN 14 09/25/2020 1415   BUN 11 02/25/2020 1627   CREATININE 0.64 09/25/2020 1415      Component Value Date/Time   CALCIUM 10.1 09/25/2020 1415   ALKPHOS 74 09/25/2020 1415   AST 19 09/25/2020 1415   ALT 11 09/25/2020 1415   BILITOT 0.6 09/25/2020 1415       Impression and Plan: Ms. Hileman is a very pleasant 86 yo caucasian female with history of bilateral stage Ia breast cancer diagnosed back in 1998. She now has a new problem with respect to a locally advanced colon cancer.  She has stage IIIb disease.  She had a 3+ lymph nodes.  We do have her on adjuvant therapy with single agent Xeloda.  I thought this would be reasonable given her age and overall performance status.  She will go ahead and start her4th cycle of Xeloda.  I am planning for a total of 6 cycles of Xeloda.  We also had to make sure that we take care of the ocular issue.  Again I do not believe this is anything related to the Xeloda since this is only 1 eye.  Looks like keratoconjunctivitis, or possibly keratitis.  We will plan to get her back in 3 more weeks.  I told her that after she  completes all of her Xeloda, we will then plan for follow-up scans.  This is how we would follow her along.   R , MD 4/28/20223:25 PM 

## 2020-09-25 NOTE — Progress Notes (Signed)
Oncology Nurse Navigator Documentation  Oncology Nurse Navigator Flowsheets 09/25/2020  Confirmed Diagnosis Date -  Diagnosis Status -  Planned Course of Treatment -  Phase of Treatment -  Chemotherapy Pending- Reason: -  Chemotherapy Actual Start Date: -  Navigator Follow Up Date: 10/16/2020  Navigator Follow Up Reason: Follow-up Appointment  Navigator Location [facility]  Navigator Encounter Type Appt/Treatment Plan Review  Telephone -  Treatment Initiated Date -  Patient Visit Type MedOnc  Treatment Phase Active Tx  Barriers/Navigation Needs Coordination of Care;Education;Anxiety;Transportation  Education -  Interventions None Required  Acuity Level 2-Minimal Needs (1-2 Barriers Identified)  Coordination of Care -  Education Method -  Time Spent with Patient 15

## 2020-09-26 DIAGNOSIS — H16102 Unspecified superficial keratitis, left eye: Secondary | ICD-10-CM | POA: Diagnosis not present

## 2020-09-26 DIAGNOSIS — H1870 Unspecified corneal deformity: Secondary | ICD-10-CM | POA: Diagnosis not present

## 2020-09-26 DIAGNOSIS — H1789 Other corneal scars and opacities: Secondary | ICD-10-CM | POA: Diagnosis not present

## 2020-09-26 LAB — CEA (IN HOUSE-CHCC): CEA (CHCC-In House): 2.4 ng/mL (ref 0.00–5.00)

## 2020-10-03 ENCOUNTER — Other Ambulatory Visit (HOSPITAL_BASED_OUTPATIENT_CLINIC_OR_DEPARTMENT_OTHER): Payer: Self-pay

## 2020-10-03 DIAGNOSIS — H1820 Unspecified corneal edema: Secondary | ICD-10-CM | POA: Diagnosis not present

## 2020-10-09 ENCOUNTER — Encounter: Payer: Self-pay | Admitting: Gastroenterology

## 2020-10-09 ENCOUNTER — Other Ambulatory Visit: Payer: Self-pay

## 2020-10-09 ENCOUNTER — Other Ambulatory Visit (HOSPITAL_COMMUNITY): Payer: Self-pay

## 2020-10-09 ENCOUNTER — Ambulatory Visit (INDEPENDENT_AMBULATORY_CARE_PROVIDER_SITE_OTHER): Payer: Medicare Other | Admitting: Gastroenterology

## 2020-10-09 VITALS — BP 140/68 | HR 84 | Ht 66.0 in | Wt 219.4 lb

## 2020-10-09 DIAGNOSIS — K649 Unspecified hemorrhoids: Secondary | ICD-10-CM

## 2020-10-09 DIAGNOSIS — C189 Malignant neoplasm of colon, unspecified: Secondary | ICD-10-CM

## 2020-10-09 DIAGNOSIS — K5900 Constipation, unspecified: Secondary | ICD-10-CM | POA: Diagnosis not present

## 2020-10-09 NOTE — Patient Instructions (Signed)
If you are age 85 or older, your body mass index should be between 23-30. Your Body mass index is 35.41 kg/m. If this is out of the aforementioned range listed, please consider follow up with your Primary Care Provider.  If you are age 87 or younger, your body mass index should be between 19-25. Your Body mass index is 35.41 kg/m. If this is out of the aformentioned range listed, please consider follow up with your Primary Care Provider.    Due to recent changes in healthcare laws, you may see the results of your imaging and laboratory studies on MyChart before your provider has had a chance to review them.  We understand that in some cases there may be results that are confusing or concerning to you. Not all laboratory results come back in the same time frame and the provider may be waiting for multiple results in order to interpret others.  Please give [place] 48 hours in order for your provider to thoroughly review all the results before contacting the office for clarification of your results.    Please follow up in 3 months, call to schedule your appointment [phone]  Thank you for choosing me and [facility].  Vito Cirigliano, D.O.

## 2020-10-09 NOTE — Progress Notes (Signed)
P  Chief Complaint:    Colon cancer follow-up  GI History: 87 year old female follows in the GI clinic with the following:  1) Colon cancer: Diagnosed during hospital admission in 05/2020 for hematochezia and acute blood loss anemia. 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  CEA 6.8.  MMR normal.  2) GERD: Index sxs of belching along with NCCP.Has been present for over 20 years. Has been evaluated in ER several times for these sxs with negative cardiac w/u. Has trialed pantoprazole, omeprazole, esomeprazole, Zantac all without any improvement in sxs. No dysphagia or odynophagia. Sxs occur 2-3 times/week. Tried bland diet diet with some improvement. Worse with coffee, chocolate, spicy foods, tomato based sauces.  pH study normal in 2020.  3) Constipation: Long standing history of constipation. Had trialed Trulance in 2020.  Otherwise, had been taking Linzess periodically for years.  Exacerbated by postoperative/malignancy pain medications and started on Movantik in 07/2020.  4) Elevated ALP: Ranges 121-156, but also in the setting of  pelvic fractures. Normal T bili. 5'nucleotidase elevated at 15 and elevated GGT at 123 in 10/2018.MRCP 10/2018 with hepatic steatosis, stable lymphadenopathy, normal bile ducts.   Endoscopic history: -Colonoscopy (09/2015, [place], [place]): 2subcentimeter tubular adenomas. Recommended repeat in 5 years for ongoing surveillance. -EGD (03/2015, [place], [place]): Non-H. pylori gastritis, gastric polyp. -Esophageal Manometry (2020): Normal -pH/impedance (2020): Normal with JD score of 4 and pH <4was 0.8% (states that she avoided eating during the study due to gag reflex from probe in place) -Colonoscopy (05/2020): 6 mm sigmoid polyp, mass at splenic  flexure/proximal descending colon (biopsy: Adenocarcinoma), sigmoid diverticulosis -EGD (05/2020): Benign fundic gland polyps otherwise normal  HPI:     Patient is a 87 y.o. female presenting to the Gastroenterology Clinic for follow-up.  Last seen by me on 07/07/2020.  Main issue at that time was constipation not responding to docusate and senna.  Was taking postoperative Norco at that time and started on Movantik, increase water intake, and can restart senna/Colace if needed.  Plan for repeat colonoscopy in 1 year for short interval surveillance (05/2021).  Was seen by Dr. Marin Olp in the Oncology clinic on 09/25/2020.  Currently on Xeloda, just completed cycle #4.  Aside from some LE neuropathy, no complaints today. Still with constipation, but somewhat better since starting Movantik. Tends to do worse with high fiber foods. Started probiotic. Still uses Senna, Miralax, Colace prn (alternates between these).   Review of systems:     No chest pain, no SOB, no fevers, no urinary sx   Past Medical History:  Diagnosis Date  . Arthritis   . Cancer (HCC)    HX BREAST CANCER/ SKIN CANCER  . Complication of anesthesia    N/V WITH MORPHINE  . Difficulty sleeping   . Fractured hip ([place])    LEFT - AUG 2016  . GERD (gastroesophageal reflux disease)   . Hyperlipidemia   . Hypertension   . Melanoma ([place])   . Neuropathy   . Nocturia   . Osteopenia   . 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 PONV (postoperative nausea and vomiting)    PT STATES MORPHINE CAUSED N/V  . Rosacea   . Sleep apnea   . Stage 1 breast cancer, ER+, right ([place]) 07/04/2017    Patient's surgical history, family medical history, social history, medications and allergies  were all reviewed in Epic    Current Outpatient Medications  Medication Sig Dispense Refill  . capecitabine (XELODA) 500 MG tablet TAKE 4 TABLETS BY MOUTH 2 TIMES DAILY AFTER A MEAL. TAKE FOR 14 DAYS, THEN HOLD FOR 7 DAYS. REPEAT EVERY 21 DAYS.  112 tablet 5  . cyclobenzaprine (FLEXERIL) 5 MG tablet Take 1 tablet (5 mg total) by mouth at bedtime as needed for muscle spasms. 90 tablet 0  . estradiol (ESTRACE VAGINAL) 0.1 MG/GM vaginal cream Apply 1gm vaginally 1-3x a week as needed for comfort 42.5 g 12  . fenofibrate (TRICOR) 48 MG tablet Take 1 tablet (48 mg total) by mouth daily. 90 tablet 3  . HYDROcodone-acetaminophen (NORCO) 10-325 MG tablet Take 1 tablet by mouth every 6 (six) hours as needed. 120 tablet 0  . ipratropium (ATROVENT) 0.03 % nasal spray Place 2 sprays into both nostrils 4 (four) times daily as needed for rhinitis. 30 mL 12  . losartan (COZAAR) 25 MG tablet Take 1 tablet (25 mg total) by mouth daily. 90 tablet 3  . Multiple Vitamins-Minerals (MULTIVITAMIN ADULTS) TABS Take 1 tablet by mouth daily.    . naloxegol oxalate (MOVANTIK) 12.5 MG TABS tablet Take 1 tablet (12.5 mg total) by mouth daily. 90 tablet 3  . neomycin-polymyxin b-dexamethasone (MAXITROL) 3.5-10000-0.1 SUSP Place 1 drop into the left eye daily as needed.    . promethazine (PHENERGAN) 12.5 MG tablet Take 1 tablet (12.5 mg total) by mouth every 6 (six) hours as needed for nausea or vomiting. 90 tablet 3  . valACYclovir (VALTREX) 1000 MG tablet Take 1,000 mg by mouth daily.     No current facility-administered medications for this visit.    Physical Exam:     BP 140/68   Pulse 84   Ht $R'5\' 6"'[place]$  (1.676 m)   Wt 219 lb 6 oz (99.5 kg)   BMI 35.41 kg/m   GENERAL:  Pleasant female in NAD PSYCH: : Cooperative, normal affect ABDOMEN:  Nondistended, soft, nontender. Normal bowel sounds SKIN:  turgor, no lesions seen Musculoskeletal:  Normal muscle tone, normal strength NEURO: Alert and oriented x 3, no focal neurologic deficits   IMPRESSION and PLAN:    1) Colon cancer 87 year old female with recently diagnosed stage IIIc colon cancer, s/p surgical resection.  Unfortunately, 3/14 LN positive, so currently being treated with Xeloda. - Continue  follow-up with Dr. 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  Discussed ARM vs referral to pelvic floor PT, but she would like to focus on cancer treatment for the time being and pick up on this evaluation after that is completed. - Continue Movantik.  Can uptitrate to 25 mg/day if needed - Continue senna and MiraLAX as needed - Continue adequate hydration  3) Internal hemorrhoids - Exacerbated with exacerbation of constipation - Discussed hemorrhoid banding.  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  Can then evaluate for clinical improvement of hemorrhoids and decide whether or not banding is appropriate at that time  - RTC in 3 months or sooner as needed     I spent 35 minutes of time, including in depth chart review, independent review of results as outlined above, communicating results with the patient directly,  face-to-face time with the patient, coordinating care, and ordering studies and medications as appropriate, and documentation.       Lavena Bullion ,DO, FACG 10/09/2020, 2:13 PM

## 2020-10-13 ENCOUNTER — Other Ambulatory Visit (HOSPITAL_COMMUNITY): Payer: Self-pay

## 2020-10-13 MED FILL — Capecitabine Tab 500 MG: ORAL | 14 days supply | Qty: 112 | Fill #1 | Status: AC

## 2020-10-16 ENCOUNTER — Telehealth: Payer: Self-pay

## 2020-10-16 ENCOUNTER — Inpatient Hospital Stay (HOSPITAL_BASED_OUTPATIENT_CLINIC_OR_DEPARTMENT_OTHER): Payer: Medicare Other | Admitting: Family

## 2020-10-16 ENCOUNTER — Encounter: Payer: Self-pay | Admitting: Family

## 2020-10-16 ENCOUNTER — Inpatient Hospital Stay: Payer: Medicare Other | Attending: Hematology & Oncology

## 2020-10-16 ENCOUNTER — Encounter: Payer: Self-pay | Admitting: *Deleted

## 2020-10-16 ENCOUNTER — Other Ambulatory Visit: Payer: Self-pay

## 2020-10-16 VITALS — BP 141/64 | HR 90 | Temp 97.9°F | Resp 20 | Ht 66.0 in | Wt 215.8 lb

## 2020-10-16 DIAGNOSIS — K591 Functional diarrhea: Secondary | ICD-10-CM | POA: Diagnosis not present

## 2020-10-16 DIAGNOSIS — R110 Nausea: Secondary | ICD-10-CM | POA: Diagnosis not present

## 2020-10-16 DIAGNOSIS — C186 Malignant neoplasm of descending colon: Secondary | ICD-10-CM | POA: Diagnosis not present

## 2020-10-16 DIAGNOSIS — R197 Diarrhea, unspecified: Secondary | ICD-10-CM | POA: Insufficient documentation

## 2020-10-16 DIAGNOSIS — Z853 Personal history of malignant neoplasm of breast: Secondary | ICD-10-CM | POA: Insufficient documentation

## 2020-10-16 DIAGNOSIS — C184 Malignant neoplasm of transverse colon: Secondary | ICD-10-CM | POA: Insufficient documentation

## 2020-10-16 DIAGNOSIS — M810 Age-related osteoporosis without current pathological fracture: Secondary | ICD-10-CM | POA: Insufficient documentation

## 2020-10-16 DIAGNOSIS — Z79899 Other long term (current) drug therapy: Secondary | ICD-10-CM | POA: Insufficient documentation

## 2020-10-16 LAB — CMP (CANCER CENTER ONLY)
ALT: 14 U/L (ref 0–44)
AST: 20 U/L (ref 15–41)
Albumin: 3.8 g/dL (ref 3.5–5.0)
Alkaline Phosphatase: 58 U/L (ref 38–126)
Anion gap: 7 (ref 5–15)
BUN: 12 mg/dL (ref 8–23)
CO2: 28 mmol/L (ref 22–32)
Calcium: 9.5 mg/dL (ref 8.9–10.3)
Chloride: 105 mmol/L (ref 98–111)
Creatinine: 0.65 mg/dL (ref 0.44–1.00)
GFR, Estimated: >60 mL/min (ref >60)
Glucose, Bld: 108 mg/dL — ABNORMAL HIGH (ref 70–99)
Potassium: 4 mmol/L (ref 3.5–5.1)
Sodium: 140 mmol/L (ref 135–145)
Total Bilirubin: 0.8 mg/dL (ref 0.3–1.2)
Total Protein: 6.3 g/dL — ABNORMAL LOW (ref 6.5–8.1)

## 2020-10-16 LAB — CBC WITH DIFFERENTIAL (CANCER CENTER ONLY)
Abs Immature Granulocytes: 0.01 10*3/uL (ref 0.00–0.07)
Basophils Absolute: 0 10*3/uL (ref 0.0–0.1)
Basophils Relative: 0 %
Eosinophils Absolute: 0.1 10*3/uL (ref 0.0–0.5)
Eosinophils Relative: 2 %
HCT: 38.1 % (ref 36.0–46.0)
Hemoglobin: 12.9 g/dL (ref 12.0–15.0)
Immature Granulocytes: 0 %
Lymphocytes Relative: 42 %
Lymphs Abs: 2.5 10*3/uL (ref 0.7–4.0)
MCH: 33.2 pg (ref 26.0–34.0)
MCHC: 33.9 g/dL (ref 30.0–36.0)
MCV: 98.2 fL (ref 80.0–100.0)
Monocytes Absolute: 0.6 10*3/uL (ref 0.1–1.0)
Monocytes Relative: 10 %
Neutro Abs: 2.7 10*3/uL (ref 1.7–7.7)
Neutrophils Relative %: 46 %
Platelet Count: 123 10*3/uL — ABNORMAL LOW (ref 150–400)
RBC: 3.88 MIL/uL (ref 3.87–5.11)
RDW: 19.9 % — ABNORMAL HIGH (ref 11.5–15.5)
WBC Count: 6 10*3/uL (ref 4.0–10.5)
nRBC: 0 % (ref 0.0–0.2)

## 2020-10-16 LAB — LACTATE DEHYDROGENASE: LDH: 154 U/L (ref 98–192)

## 2020-10-16 MED ORDER — DIPHENOXYLATE-ATROPINE 2.5-0.025 MG PO TABS: 1.0000 | ORAL_TABLET | Freq: Four times a day (QID) | ORAL | 0 refills | Status: DC | PRN

## 2020-10-16 NOTE — Progress Notes (Signed)
Visited with patient prior to her visit with NP. She has been doing very well until this passed Sunday when she started to experience diarrhea. She has been using imodium for control which has been mostly effective. Reviewed management with her and spoke about increasing her fluid intake. She knows to continue the imodium for symptoms control and to call the office if symptoms become unresponsive to imodium. Reviewed the possibility of needing lomotil and she asks that we go ahead and send the prescription in case she needs it.  Notified Laverna Peace NP of the above.   Oncology Nurse Navigator Documentation  Oncology Nurse Navigator Flowsheets 10/16/2020  Confirmed Diagnosis Date -  Diagnosis Status -  Planned Course of Treatment -  Phase of Treatment -  Chemotherapy Pending- Reason: -  Chemotherapy Actual Start Date: -  Navigator Follow Up Date: 11/07/2020  Navigator Follow Up Reason: Follow-up Appointment  Navigator Location [facility]  Navigator Encounter Type Follow-up Appt  Telephone -  Treatment Initiated Date -  Patient Visit Type MedOnc  Treatment Phase Active Tx  Barriers/Navigation Needs Coordination of Care;Education;Anxiety;Transportation  Education Pain/ Symptom Management  Interventions Education;Psycho-Social Support  Acuity Level 2-Minimal Needs (1-2 Barriers Identified)  Coordination of Care -  Education Method Verbal  Time Spent with Patient 30

## 2020-10-16 NOTE — Telephone Encounter (Signed)
appts made and printed for pt per 5/19 los, appt made with sarah as dr e has no avail appts

## 2020-10-16 NOTE — Progress Notes (Signed)
Hematology and Oncology Follow Up Visit  Kimya Mccahill [id number] [birth year] 87 y.o. 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 Year -- next dose on 11/2020   Interim History:  Ms. Bias is here today for follow-up. She started cycle 5 of Xeloda today. She states that starting this past Sunday she has had diarrhea. She notes that Imodium does seem to help.  She has had some mild nausea and no vomiting. Abdomen is soft but a little tender on palpation. She has good bowel sounds throughout.  No fever, chills, cough, rash, dizziness, SOB, chest pain, palpitations, abdominal pain or changes in bladder habits.  No swelling in her extremities at this time. She has numbness and tingling in her feet that she describes as stable/unchanged.  No falls or syncope.  She is still eating well despite the occasional nausea and diarrhea. She is also working hard to stay well hydrated. Her weight is 215 lbs.   ECOG Performance Status: 1 - Symptomatic but completely ambulatory  Medications:  Allergies as of 10/16/2020      Reactions   Darvon [propoxyphene] Nausea And Vomiting, Palpitations   Darvocet Causes Sweats   Adhesive [tape] Rash   Morphine And Related Nausea And Vomiting      Medication List       Accurate as of Oct 16, 2020  1:13 PM. If you have any questions, ask your nurse or doctor.        capecitabine 500 MG tablet Commonly known as: XELODA TAKE 4 TABLETS BY MOUTH 2 TIMES DAILY AFTER A MEAL. TAKE FOR 14 DAYS, THEN HOLD FOR 7 DAYS. REPEAT EVERY 21 DAYS.   cyclobenzaprine 5 MG tablet Commonly known as: FLEXERIL Take 1 tablet (5 mg total) by mouth at bedtime as  needed for muscle spasms.   estradiol 0.1 MG/GM vaginal cream Commonly known as: ESTRACE VAGINAL Apply 1gm vaginally 1-3x a week as needed for comfort   fenofibrate 48 MG tablet Commonly known as: Tricor Take 1 tablet (48 mg total) by mouth daily.   HYDROcodone-acetaminophen 10-325 MG tablet Commonly known as: NORCO Take 1 tablet by mouth every 6 (six) hours as needed.   ipratropium 0.03 % nasal spray Commonly known as: ATROVENT Place 2 sprays into both nostrils 4 (four) times daily as needed for rhinitis.   losartan 25 MG tablet Commonly known as: COZAAR Take 1 tablet (25 mg total) by mouth daily.   Multivitamin Adults Tabs Take 1 tablet by mouth daily.   naloxegol oxalate 12.5 MG Tabs tablet Commonly known as: MOVANTIK Take 1 tablet (12.5 mg total) by mouth daily.   neomycin-polymyxin b-dexamethasone 3.5-10000-0.1 Susp Commonly known as: MAXITROL Place 1 drop into the left eye daily as needed.   promethazine 12.5 MG tablet Commonly known as: PHENERGAN Take 1 tablet (12.5 mg total) by mouth every 6 (six) hours as needed for nausea or vomiting.   valACYclovir 1000 MG tablet Commonly known as: VALTREX Take 1,000 mg by mouth daily.       Allergies:  Allergies  Allergen Reactions  . Darvon [Propoxyphene] Nausea And Vomiting and Palpitations    Darvocet Causes Sweats  . Adhesive [Tape] Rash  .  Morphine And Related Nausea And Vomiting    Past Medical History, Surgical history, Social history, and Family History were reviewed and updated.  Review of Systems: All other 10 point review of systems is negative.   Physical Exam:  vitals were not taken for this visit.   Wt Readings from Last 3 Encounters:  10/09/20 219 lb 6 oz (99.5 kg)  09/25/20 218 lb 12.8 oz (99.2 kg)  09/22/20 218 lb (98.9 kg)    Ocular: Sclerae unicteric, pupils equal, round and reactive to light Ear-nose-throat: Oropharynx clear, dentition fair Lymphatic: No cervical or supraclavicular  adenopathy Lungs no rales or rhonchi, good excursion bilaterally Heart regular rate and rhythm, no murmur appreciated Abd soft, generalized tenderness, positive bowel sounds throughout all 4 quadrants MSK no focal spinal tenderness, no joint edema Neuro: non-focal, well-oriented, appropriate affect Breasts: Deferred   Lab Results  Component Value Date   WBC 6.0 10/16/2020   HGB 12.9 10/16/2020   HCT 38.1 10/16/2020   MCV 98.2 10/16/2020   PLT 123 (L) 10/16/2020   Lab Results  Component Value Date   FERRITIN 45 06/09/2020   IRON 49 06/09/2020   TIBC 320 06/09/2020   UIBC 271 06/09/2020   IRONPCTSAT 15 06/09/2020   Lab Results  Component Value Date   RETICCTPCT 1.6 06/09/2020   RBC 3.88 10/16/2020   No results found for: [name], [facility] Lab Results  Component Value Date   IGGSERUM 1,085 02/09/2017   IGMSERUM 336 (H) 02/09/2017   No results found for: [name], SPEI   Chemistry      Component Value Date/Time   NA 138 09/25/2020 1415   NA 139 02/25/2020 1627   K 4.3 09/25/2020 1415   CL 102 09/25/2020 1415   CO2 31 09/25/2020 1415   BUN 14 09/25/2020 1415   BUN 11 02/25/2020 1627   CREATININE 0.64 09/25/2020 1415      Component Value Date/Time   CALCIUM 10.1 09/25/2020 1415   ALKPHOS 74 09/25/2020 1415   AST 19 09/25/2020 1415   ALT 11 09/25/2020 1415   BILITOT 0.6 09/25/2020 1415       Impression and Plan: Ms. Volcy is a very pleasant 87 yo caucasian female with history of bilateral stage Ia breast cancer diagnosed back in 1998. She now has locally advanced colon cancer stage IIIb with 3+ lymph nodes. CEA last visit was 2.40.  She is doing fairly well on adjuvant Xeloda but has started having diarrhea this week. We discussed holding her Xeloda for a few days but she would prefer to keep taking as the imodium seems to be helping.  She is worried about the weekend and would like  to have something prescription strength for diarrhea if needed. Lomotil prescription was sent to her pharmacy.  We will do a follow-up call on Monday.  She was encouraged to contact our office with any questions or concerns and to call EMS in the event of an emergency.  Follow-up in 3 weeks.   Laverna Peace, NP 5/19/20221:13 PM

## 2020-10-17 LAB — CEA (IN HOUSE-CHCC): CEA (CHCC-In House): 2.15 ng/mL (ref 0.00–5.00)

## 2020-10-20 ENCOUNTER — Telehealth: Payer: Self-pay | Admitting: *Deleted

## 2020-10-20 NOTE — Telephone Encounter (Signed)
Called patient to check on status of diarrhea.  She is still having liquid stool every time she eats which is 5-6 times a day.  Taking Lomotil 4 times a day with out any success.  Dr Marin Olp notified.  Stop Xeloda until we see patient in office in a few weeks.  Patient understanding of this info and will stop medication.

## 2020-10-21 ENCOUNTER — Encounter: Payer: Medicare Other | Admitting: Registered Nurse

## 2020-10-21 ENCOUNTER — Telehealth: Payer: Self-pay | Admitting: Registered Nurse

## 2020-10-21 NOTE — Progress Notes (Deleted)
Subjective:    Patient ID: Crystal Lee, female    DOB: [birth year], 87 y.o.   MRN: [id number]  HPI  Pain Inventory Average Pain {NUMBERS; 0-10:5044} Pain Right Now {NUMBERS; 0-10:5044} My pain is {PAIN DESCRIPTION:21022940}  In the last 24 hours, has pain interfered with the following? General activity {NUMBERS; 0-10:5044} Relation with others {NUMBERS; 0-10:5044} Enjoyment of life {NUMBERS; 0-10:5044} What TIME of day is your pain at its worst? {time of day:24191} Sleep (in general) {BHH GOOD/FAIR/POOR:22877}  Pain is worse with: {ACTIVITIES:21022942} Pain improves with: {PAIN IMPROVES GEXB:28413244} Relief from Meds: {NUMBERS; 0-10:5044}  Family History  Problem Relation Age of Onset  . Other Mother        complications from flu  . Other Father        unsure of cause  . Colon cancer Neg Hx    Social History   Socioeconomic History  . Marital status: Widowed    Spouse name: Not on file  . Number of children: 3  . Years of education: 16 years  . Highest education level: Not on file  Occupational History  . Occupation: Retired  Tobacco Use  . Smoking status: Former Smoker    Packs/day: 0.50    Years: 20.00    Pack years: 10.00    Quit date: 02/03/1976    Years since quitting: 44.7  . Smokeless tobacco: Never Used  Vaping Use  . Vaping Use: Never used  Substance and Sexual Activity  . Alcohol use: No  . Drug use: No  . Sexual activity: Yes    Birth control/protection: Post-menopausal  Other Topics Concern  . Not on file  Social History Narrative   Lives at home with husband.   Right-handed.   No caffeine use.   Social Determinants of Health   Financial Resource Strain: Not on file  Food Insecurity: Not on file  Transportation Needs: Not on file  Physical Activity: Not on file  Stress: Not on file  Social Connections: Not on file   Past Surgical History:  Procedure Laterality Date  . [place] STUDY N/A 06/21/2018   Procedure: [place] STUDY;   Surgeon: Lavena Bullion, DO;  [facility]: [facility];  Service: Gastroenterology;  Laterality: N/A;  with impedance  . ABDOMINAL HYSTERECTOMY  2010  . ANAL RECTAL MANOMETRY N/A 09/19/2019   Procedure: ANO RECTAL MANOMETRY;  Surgeon: Mauri Pole, MD;  [facility]: [facility];  Service: Endoscopy;  Laterality: N/A;  . APPENDECTOMY  1949  . [place]  . BIOPSY  06/10/2020   Procedure: BIOPSY;  Surgeon: Lavena Bullion, DO;  [facility];  Service: Gastroenterology;;  EGD and COLON  . BREAST SURGERY    . CATARACT EXTRACTION Bilateral 2011  . CHOLECYSTECTOMY    . COLON RESECTION N/A 06/12/2020   Procedure: LAPAROSCOPIC COLON RESECTION;  Surgeon: [name], MD;  Location: [facility];  Service: General;  Laterality: N/A;  . COLONOSCOPY WITH PROPOFOL N/A 06/10/2020   Procedure: COLONOSCOPY WITH PROPOFOL;  Surgeon: Lavena Bullion, DO;  [facility];  Service: Gastroenterology;  Laterality: N/A;  . ESOPHAGEAL MANOMETRY N/A 06/21/2018   Procedure: ESOPHAGEAL MANOMETRY (EM);  Surgeon: Lavena Bullion, DO;  [facility];  Service: Gastroenterology;  Laterality: N/A;  . ESOPHAGOGASTRODUODENOSCOPY (EGD) WITH PROPOFOL N/A 06/10/2020   Procedure: ESOPHAGOGASTRODUODENOSCOPY (EGD) WITH PROPOFOL;  Surgeon: Lavena Bullion, DO;  [facility];  Service: Gastroenterology;  Laterality: N/A;  . GALLBLADDER SURGERY  2015  .  JOINT REPLACEMENT     RT TOTAL HIP / RT TOTAL KNEE  . MASTECTOMY  1998   BILATERAL   . [place] IMPEDANCE STUDY  06/21/2018   Procedure: [place] IMPEDANCE STUDY;  Surgeon: Lavena Bullion, DO;  [facility];  Service: Gastroenterology;;  . POLYPECTOMY  06/10/2020   Procedure: POLYPECTOMY;  Surgeon: Lavena Bullion, DO;  [facility];  Service: Gastroenterology;;  . SKIN CANCER EXCISION  2016   RT SIDE OF NOSE  . SUBMUCOSAL TATTOO INJECTION  06/10/2020   Procedure: SUBMUCOSAL TATTOO INJECTION;  Surgeon:  Lavena Bullion, DO;  [facility];  Service: Gastroenterology;;  . TONSILLECTOMY  1953  . TOTAL HIP ARTHROPLASTY  2010   RIGHT  . TOTAL HIP ARTHROPLASTY Left 05/09/2015   Procedure: LEFT TOTAL HIP ARTHROPLASTY ANTERIOR APPROACH;  Surgeon: Gaynelle Arabian, MD;  [facility];  Service: Orthopedics;  Laterality: Left;  . TOTAL KNEE ARTHROPLASTY  2001  . TUMOR REMOVAL  2012   ABDOMINAL - NON CANCEROUS   Past Surgical History:  Procedure Laterality Date  . [place] STUDY N/A 06/21/2018   Procedure: [place] STUDY;  Surgeon: Lavena Bullion, DO;  [facility];  Service: Gastroenterology;  Laterality: N/A;  with impedance  . ABDOMINAL HYSTERECTOMY  2010  . ANAL RECTAL MANOMETRY N/A 09/19/2019   Procedure: ANO RECTAL MANOMETRY;  Surgeon: Mauri Pole, MD;  [facility];  Service: Endoscopy;  Laterality: N/A;  . APPENDECTOMY  1949  . [place]  . BIOPSY  06/10/2020   Procedure: BIOPSY;  Surgeon: Lavena Bullion, DO;  [facility];  Service: Gastroenterology;;  EGD and COLON  . BREAST SURGERY    . CATARACT EXTRACTION Bilateral 2011  . CHOLECYSTECTOMY    . COLON RESECTION N/A 06/12/2020   Procedure: LAPAROSCOPIC COLON RESECTION;  Surgeon: [name], MD;  [facility];  Service: General;  Laterality: N/A;  . COLONOSCOPY WITH PROPOFOL N/A 06/10/2020   Procedure: COLONOSCOPY WITH PROPOFOL;  Surgeon: Lavena Bullion, DO;  [facility];  Service: Gastroenterology;  Laterality: N/A;  . ESOPHAGEAL MANOMETRY N/A 06/21/2018   Procedure: ESOPHAGEAL MANOMETRY (EM);  Surgeon: Lavena Bullion, DO;  [facility];  Service: Gastroenterology;  Laterality: N/A;  . ESOPHAGOGASTRODUODENOSCOPY (EGD) WITH PROPOFOL N/A 06/10/2020   Procedure: ESOPHAGOGASTRODUODENOSCOPY (EGD) WITH PROPOFOL;  Surgeon: Lavena Bullion, DO;  [facility];  Service: Gastroenterology;  Laterality: N/A;  . GALLBLADDER SURGERY   2015  . JOINT REPLACEMENT     RT TOTAL HIP / RT TOTAL KNEE  . MASTECTOMY  1998   BILATERAL   . [place] IMPEDANCE STUDY  06/21/2018   Procedure: [place] IMPEDANCE STUDY;  Surgeon: Lavena Bullion, DO;  [facility];  Service: Gastroenterology;;  . POLYPECTOMY  06/10/2020   Procedure: POLYPECTOMY;  Surgeon: Lavena Bullion, DO;  [facility];  Service: Gastroenterology;;  . SKIN CANCER EXCISION  2016   RT SIDE OF NOSE  . SUBMUCOSAL TATTOO INJECTION  06/10/2020   Procedure: SUBMUCOSAL TATTOO INJECTION;  Surgeon: Lavena Bullion, DO;  [facility];  Service: Gastroenterology;;  . TONSILLECTOMY  1953  . TOTAL HIP ARTHROPLASTY  2010   RIGHT  . TOTAL HIP ARTHROPLASTY Left 05/09/2015   Procedure: LEFT TOTAL HIP ARTHROPLASTY ANTERIOR APPROACH;  Surgeon: Gaynelle Arabian, MD;  [facility];  Service: Orthopedics;  Laterality: Left;  . TOTAL KNEE ARTHROPLASTY  2001  . TUMOR REMOVAL  2012  ABDOMINAL - NON CANCEROUS   Past Medical History:  Diagnosis Date  . Arthritis   . Cancer (HCC)    HX BREAST CANCER/ SKIN CANCER  . Complication of anesthesia    N/V WITH MORPHINE  . Difficulty sleeping   . Fractured hip ([place])    LEFT - AUG 2016  . GERD (gastroesophageal reflux disease)   . Hyperlipidemia   . Hypertension   . Melanoma ([place])   . Neuropathy   . Nocturia   . Osteopenia   . 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 PONV (postoperative nausea and vomiting)    PT STATES MORPHINE CAUSED N/V  . Rosacea   . Sleep apnea   . Stage 1 breast cancer, ER+, right ([place]) 07/04/2017   There were no vitals taken for this visit.  Opioid Risk Score:   Fall Risk Score:  `1  Depression screen PHQ 2/9  Depression screen [facility] 2/9 09/22/2020 08/11/2020 05/20/2020 12/24/2019 11/22/2018 09/28/2018 06/02/2018  Decreased Interest 0 0 0 0 0 0 0  Down, Depressed, Hopeless 0 0 0 0 0 0 0  PHQ - 2 Score 0 0 0 0 0 0 0  Altered sleeping - - - - - - -  Tired, decreased energy - -  - - - - -  Change in appetite - - - - - - -  Feeling bad or failure about yourself  - - - - - - -  Trouble concentrating - - - - - - -  Moving slowly or fidgety/restless - - - - - - -  Suicidal thoughts - - - - - - -  PHQ-9 Score - - - - - - -  Difficult doing work/chores - - - - - - -  Some recent data might be hidden      Review of Systems     Objective:   Physical Exam        Assessment & Plan:

## 2020-10-21 NOTE — Telephone Encounter (Signed)
Ms. Dearman cancelled her appointment today, not feeling well. PMP was reviewed, last Hydrocodone was filled on 10/15/2020. She was rescheduled for November 13, 2020.

## 2020-10-22 ENCOUNTER — Ambulatory Visit: Payer: Medicare Other | Admitting: Registered Nurse

## 2020-10-23 ENCOUNTER — Ambulatory Visit: Payer: Medicare Other | Admitting: Registered Nurse

## 2020-10-30 ENCOUNTER — Other Ambulatory Visit (HOSPITAL_COMMUNITY): Payer: Self-pay

## 2020-10-31 DIAGNOSIS — M549 Dorsalgia, unspecified: Secondary | ICD-10-CM | POA: Diagnosis not present

## 2020-11-03 ENCOUNTER — Other Ambulatory Visit: Payer: Self-pay

## 2020-11-03 DIAGNOSIS — H18593 Other hereditary corneal dystrophies, bilateral: Secondary | ICD-10-CM | POA: Diagnosis not present

## 2020-11-03 DIAGNOSIS — H1820 Unspecified corneal edema: Secondary | ICD-10-CM | POA: Diagnosis not present

## 2020-11-07 ENCOUNTER — Inpatient Hospital Stay: Payer: Medicare Other | Attending: Hematology & Oncology

## 2020-11-07 ENCOUNTER — Encounter: Payer: Self-pay | Admitting: Hematology & Oncology

## 2020-11-07 ENCOUNTER — Inpatient Hospital Stay (HOSPITAL_BASED_OUTPATIENT_CLINIC_OR_DEPARTMENT_OTHER): Payer: Medicare Other | Admitting: Family

## 2020-11-07 ENCOUNTER — Other Ambulatory Visit: Payer: Self-pay

## 2020-11-07 ENCOUNTER — Other Ambulatory Visit (HOSPITAL_COMMUNITY): Payer: Self-pay

## 2020-11-07 ENCOUNTER — Encounter: Payer: Self-pay | Admitting: *Deleted

## 2020-11-07 DIAGNOSIS — C50911 Malignant neoplasm of unspecified site of right female breast: Secondary | ICD-10-CM

## 2020-11-07 DIAGNOSIS — C184 Malignant neoplasm of transverse colon: Secondary | ICD-10-CM | POA: Insufficient documentation

## 2020-11-07 DIAGNOSIS — Z79899 Other long term (current) drug therapy: Secondary | ICD-10-CM | POA: Insufficient documentation

## 2020-11-07 DIAGNOSIS — C186 Malignant neoplasm of descending colon: Secondary | ICD-10-CM

## 2020-11-07 DIAGNOSIS — Z853 Personal history of malignant neoplasm of breast: Secondary | ICD-10-CM | POA: Insufficient documentation

## 2020-11-07 DIAGNOSIS — K591 Functional diarrhea: Secondary | ICD-10-CM

## 2020-11-07 DIAGNOSIS — Z170 Estrogen receptor positive status [ER+]: Secondary | ICD-10-CM | POA: Diagnosis not present

## 2020-11-07 DIAGNOSIS — M810 Age-related osteoporosis without current pathological fracture: Secondary | ICD-10-CM | POA: Insufficient documentation

## 2020-11-07 LAB — CBC WITH DIFFERENTIAL (CANCER CENTER ONLY)
Abs Immature Granulocytes: 0.02 10*3/uL (ref 0.00–0.07)
Basophils Absolute: 0 10*3/uL (ref 0.0–0.1)
Basophils Relative: 1 %
Eosinophils Absolute: 0.4 10*3/uL (ref 0.0–0.5)
Eosinophils Relative: 6 %
HCT: 39.1 % (ref 36.0–46.0)
Hemoglobin: 12.8 g/dL (ref 12.0–15.0)
Immature Granulocytes: 0 %
Lymphocytes Relative: 30 %
Lymphs Abs: 2 10*3/uL (ref 0.7–4.0)
MCH: 32.9 pg (ref 26.0–34.0)
MCHC: 32.7 g/dL (ref 30.0–36.0)
MCV: 100.5 fL — ABNORMAL HIGH (ref 80.0–100.0)
Monocytes Absolute: 0.5 10*3/uL (ref 0.1–1.0)
Monocytes Relative: 7 %
Neutro Abs: 3.7 10*3/uL (ref 1.7–7.7)
Neutrophils Relative %: 56 %
Platelet Count: 123 10*3/uL — ABNORMAL LOW (ref 150–400)
RBC: 3.89 MIL/uL (ref 3.87–5.11)
RDW: 15.9 % — ABNORMAL HIGH (ref 11.5–15.5)
WBC Count: 6.7 10*3/uL (ref 4.0–10.5)
nRBC: 0 % (ref 0.0–0.2)

## 2020-11-07 LAB — CMP (CANCER CENTER ONLY)
ALT: 16 U/L (ref 0–44)
AST: 21 U/L (ref 15–41)
Albumin: 3.5 g/dL (ref 3.5–5.0)
Alkaline Phosphatase: 101 U/L (ref 38–126)
Anion gap: 7 (ref 5–15)
BUN: 9 mg/dL (ref 8–23)
CO2: 28 mmol/L (ref 22–32)
Calcium: 9.4 mg/dL (ref 8.9–10.3)
Chloride: 104 mmol/L (ref 98–111)
Creatinine: 0.6 mg/dL (ref 0.44–1.00)
GFR, Estimated: >60 mL/min (ref >60)
Glucose, Bld: 100 mg/dL — ABNORMAL HIGH (ref 70–99)
Potassium: 3.7 mmol/L (ref 3.5–5.1)
Sodium: 139 mmol/L (ref 135–145)
Total Bilirubin: 0.7 mg/dL (ref 0.3–1.2)
Total Protein: 5.8 g/dL — ABNORMAL LOW (ref 6.5–8.1)

## 2020-11-07 LAB — LACTATE DEHYDROGENASE: LDH: 186 U/L (ref 98–192)

## 2020-11-07 NOTE — Progress Notes (Signed)
Oncology Nurse Navigator Documentation  Oncology Nurse Navigator Flowsheets 11/07/2020  Confirmed Diagnosis Date -  Diagnosis Status -  Planned Course of Treatment -  Phase of Treatment -  Chemotherapy Pending- Reason: -  Chemotherapy Actual Start Date: -  Navigator Follow Up Date: 12/02/2020  Navigator Follow Up Reason: Follow-up Appointment  Navigator Location [facility]  Navigator Encounter Type Appt/Treatment Plan Review  Telephone -  Treatment Initiated Date -  Patient Visit Type MedOnc  Treatment Phase Active Tx  Barriers/Navigation Needs Coordination of Care;Education;Anxiety;Transportation  Education -  Interventions None Required  Acuity -  Coordination of Care -  Education Method -  Time Spent with Patient 15

## 2020-11-07 NOTE — Progress Notes (Signed)
Hematology and Oncology Follow Up Visit  Crystal Lee [id number] Jun 02, 1933 87 y.o. 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 Year -- next dose on 11/2020   Interim History:  Crystal Lee is here today for follow-up. Her diarrhea has resolved. She stopped the Xeloda 3 weeks ago. She notes that her diarrhea finally stopped after she started drinking a glass of goat milk each day.  No blood loss noted. No bruising or petechiae.  She does note fatigue at times.  She had an episode of palpitations yesterday that resolved once she took a break to sit and rest. No other episodes to report.  She has occasional mild SOB with over exertion and takes a break to rest as needed.  No fever, chills, n/v, cough, rash, dizziness, chest pain, abdominal pain or changes in bowel or bladder habits.  She has occasional puffiness in her feet that waxes and wanes.  Pedal pulses are 2+. No redness or pitting edema noted.  No tenderness in her extremities.  Numbness and tingling in her feet is unchanged.  No falls or syncope to report.  Her appetite comes and goes. She is drinking a Boost daily for added protein. She is doing her best to stay well hydrated. Her weight is stable at 214 lbs.   ECOG Performance Status: 1 - Symptomatic but completely ambulatory  Medications:  Allergies as of 11/07/2020       Reactions   Darvon [propoxyphene] Nausea And Vomiting, Palpitations   Darvocet Causes Sweats   Adhesive [tape] Rash   Morphine And Related Nausea And Vomiting        Medication List        Accurate as of November 07, 2020  2:52 PM. If you have any questions, ask your nurse or  doctor.          capecitabine 500 MG tablet Commonly known as: XELODA TAKE 4 TABLETS BY MOUTH 2 TIMES DAILY AFTER A MEAL. TAKE FOR 14 DAYS, THEN HOLD FOR 7 DAYS. REPEAT EVERY 21 DAYS.   cyclobenzaprine 5 MG tablet Commonly known as: FLEXERIL Take 1 tablet (5 mg total) by mouth at bedtime as needed for muscle spasms.   diphenoxylate-atropine 2.5-0.025 MG tablet Commonly known as: LOMOTIL Take 1 tablet by mouth 4 (four) times daily as needed for diarrhea or loose stools.   estradiol 0.1 MG/GM vaginal cream Commonly known as: ESTRACE VAGINAL Apply 1gm vaginally 1-3x a week as needed for comfort   fenofibrate 48 MG tablet Commonly known as: Tricor Take 1 tablet (48 mg total) by mouth daily.   HYDROcodone-acetaminophen 10-325 MG tablet Commonly known as: NORCO Take 1 tablet by mouth every 6 (six) hours as needed.   Imodium A-D 2 MG tablet Generic drug: loperamide Take 2 mg by mouth 4 (four) times daily as needed for diarrhea or loose stools.   ipratropium 0.03 % nasal spray Commonly known as: ATROVENT Place 2 sprays into both nostrils 4 (four) times daily as needed for rhinitis.   losartan 25 MG tablet Commonly known as: COZAAR Take 1 tablet (25 mg total) by mouth daily.   Multivitamin Adults Tabs Take 1 tablet by mouth  daily.   naloxegol oxalate 12.5 MG Tabs tablet Commonly known as: MOVANTIK Take 1 tablet (12.5 mg total) by mouth daily.   neomycin-polymyxin b-dexamethasone 3.5-10000-0.1 Susp Commonly known as: MAXITROL Place 1 drop into the left eye daily as needed.   promethazine 12.5 MG tablet Commonly known as: PHENERGAN Take 1 tablet (12.5 mg total) by mouth every 6 (six) hours as needed for nausea or vomiting.   valACYclovir 1000 MG tablet Commonly known as: VALTREX Take 1,000 mg by mouth daily.        Allergies:  Allergies  Allergen Reactions   Darvon [Propoxyphene] Nausea And Vomiting and Palpitations    Darvocet Causes Sweats   Adhesive  [Tape] Rash   Morphine And Related Nausea And Vomiting    Past Medical History, Surgical history, Social history, and Family History were reviewed and updated.  Review of Systems: All other 10 point review of systems is negative.   Physical Exam:  vitals were not taken for this visit.   Wt Readings from Last 3 Encounters:  10/16/20 215 lb 12.8 oz (97.9 kg)  10/09/20 219 lb 6 oz (99.5 kg)  09/25/20 218 lb 12.8 oz (99.2 kg)    Ocular: Sclerae unicteric, pupils equal, round and reactive to light Ear-nose-throat: Oropharynx clear, dentition fair Lymphatic: No cervical or supraclavicular adenopathy Lungs no rales or rhonchi, good excursion bilaterally Heart regular rate and rhythm, no murmur appreciated Abd soft, nontender, positive bowel sounds MSK no focal spinal tenderness, no joint edema Neuro: non-focal, well-oriented, appropriate affect Breasts: Deferred   Lab Results  Component Value Date   WBC 6.7 11/07/2020   HGB 12.8 11/07/2020   HCT 39.1 11/07/2020   MCV 100.5 (H) 11/07/2020   PLT 123 (L) 11/07/2020   Lab Results  Component Value Date   FERRITIN 45 06/09/2020   IRON 49 06/09/2020   TIBC 320 06/09/2020   UIBC 271 06/09/2020   IRONPCTSAT 15 06/09/2020   Lab Results  Component Value Date   RETICCTPCT 1.6 06/09/2020   RBC 3.89 11/07/2020   No results found for: [name], [facility] Lab Results  Component Value Date   IGGSERUM 1,085 02/09/2017   IGMSERUM 336 (H) 02/09/2017   No results found for: [name], SPEI   Chemistry      Component Value Date/Time   NA 139 11/07/2020 1359   NA 139 02/25/2020 1627   K 3.7 11/07/2020 1359   CL 104 11/07/2020 1359   CO2 28 11/07/2020 1359   BUN 9 11/07/2020 1359   BUN 11 02/25/2020 1627   CREATININE 0.60 11/07/2020 1359      Component Value Date/Time   CALCIUM 9.4 11/07/2020 1359   ALKPHOS 101 11/07/2020 1359   AST 21 11/07/2020 1359    ALT 16 11/07/2020 1359   BILITOT 0.7 11/07/2020 1359       Impression and Plan:  Crystal Lee is a very pleasant 87 yo caucasian female with history of bilateral stage Ia breast cancer diagnosed back in 1998. She now has locally advanced colon cancer stage IIIb with 3+ lymph nodes. CEA at her last visit was 2.15.  She has been holding her Xeloda for 3 weeks due to diarrhea that has resolved.  I spoke with Dr. Marin Olp and we will have her restart her same dose of Xeloda but now take 10 days on and 10 days off. She verbalized understanding and agreement with the plan.  We will see her for follow-up in another  3 weeks.  She promises to contact our office with any questions or concerns. She will let us know if the diarrhea returns.   Laverna Peace, NP 6/10/20222:52 PM

## 2020-11-10 LAB — CEA (IN HOUSE-CHCC): CEA (CHCC-In House): 2.5 ng/mL (ref 0.00–5.00)

## 2020-11-11 ENCOUNTER — Other Ambulatory Visit: Payer: Self-pay

## 2020-11-11 ENCOUNTER — Encounter: Payer: Medicare Other | Attending: Physical Medicine & Rehabilitation | Admitting: Registered Nurse

## 2020-11-11 ENCOUNTER — Encounter: Payer: Self-pay | Admitting: Registered Nurse

## 2020-11-11 VITALS — BP 139/79 | HR 84 | Temp 98.1°F | Ht 66.0 in | Wt 213.4 lb

## 2020-11-11 DIAGNOSIS — G894 Chronic pain syndrome: Secondary | ICD-10-CM | POA: Diagnosis not present

## 2020-11-11 DIAGNOSIS — M170 Bilateral primary osteoarthritis of knee: Secondary | ICD-10-CM | POA: Diagnosis not present

## 2020-11-11 DIAGNOSIS — Z79899 Other long term (current) drug therapy: Secondary | ICD-10-CM | POA: Diagnosis not present

## 2020-11-11 DIAGNOSIS — M961 Postlaminectomy syndrome, not elsewhere classified: Secondary | ICD-10-CM | POA: Diagnosis not present

## 2020-11-11 DIAGNOSIS — R202 Paresthesia of skin: Secondary | ICD-10-CM | POA: Diagnosis not present

## 2020-11-11 DIAGNOSIS — M2550 Pain in unspecified joint: Secondary | ICD-10-CM

## 2020-11-11 DIAGNOSIS — Z5181 Encounter for therapeutic drug level monitoring: Secondary | ICD-10-CM

## 2020-11-11 DIAGNOSIS — M7061 Trochanteric bursitis, right hip: Secondary | ICD-10-CM | POA: Diagnosis not present

## 2020-11-11 DIAGNOSIS — M7062 Trochanteric bursitis, left hip: Secondary | ICD-10-CM | POA: Insufficient documentation

## 2020-11-11 DIAGNOSIS — S32000S Wedge compression fracture of unspecified lumbar vertebra, sequela: Secondary | ICD-10-CM

## 2020-11-11 MED ORDER — HYDROCODONE-ACETAMINOPHEN 10-325 MG PO TABS: 1.0000 | ORAL_TABLET | Freq: Four times a day (QID) | ORAL | 0 refills | Status: DC | PRN

## 2020-11-11 MED ORDER — CYCLOBENZAPRINE HCL 5 MG PO TABS: 5.0000 mg | ORAL_TABLET | Freq: Every evening | ORAL | 0 refills | Status: DC | PRN

## 2020-11-11 NOTE — Progress Notes (Signed)
Subjective:    Patient ID: Crystal Lee, female    DOB: 29-Aug-1933, 87 y.o.   MRN: [id number]  HPI: Crystal Lee is a 87 y.o. female who returns for follow up appointment for chronic pain and medication refill. She states her pain is located in her lower back pain, bilateral hips, lower extremities and bilateral feet. She rates her pain 5. Her current exercise regime is walking with walker.   Ms. Blucher Morphine equivalent is 40.00  MME.   Last oral swab was performed on 08/11/2020, it was consistent.     Pain Inventory Average Pain 5 Pain Right Now 5 My pain is burning, tingling, and aching  In the last 24 hours, has pain interfered with the following? General activity 6 Relation with others 3 Enjoyment of life 3 What TIME of day is your pain at its worst? varies Sleep (in general) NA  Pain is worse with: walking, standing, and some activites Pain improves with: rest, heat/ice, and medication Relief from Meds: 3  Family History  Problem Relation Age of Onset   Other Mother        complications from flu   Other Father        unsure of cause   Colon cancer Neg Hx    Social History   Socioeconomic History   Marital status: Widowed    Spouse name: Not on file   Number of children: 3   Years of education: 16 years   Highest education level: Not on file  Occupational History   Occupation: Retired  Tobacco Use   Smoking status: Former    Packs/day: 0.50    Years: 20.00    Pack years: 10.00    Types: Cigarettes    Quit date: 02/03/1976    Years since quitting: 44.8   Smokeless tobacco: Never  Vaping Use   Vaping Use: Never used  Substance and Sexual Activity   Alcohol use: No   Drug use: No   Sexual activity: Yes    Birth control/protection: Post-menopausal  Other Topics Concern   Not on file  Social History Narrative   Lives at home with husband.   Right-handed.   No caffeine use.   Social Determinants of Health   Financial Resource Strain: Not on file   Food Insecurity: Not on file  Transportation Needs: Not on file  Physical Activity: Not on file  Stress: Not on file  Social Connections: Not on file   Past Surgical History:  Procedure Laterality Date   67 HOUR [place] STUDY N/A 06/21/2018   Procedure: 24 HOUR [place];  Surgeon: Lavena Bullion, DO;  [facility];  Service: Gastroenterology;  Laterality: N/A;  with impedance   ABDOMINAL HYSTERECTOMY  2010   ANAL RECTAL MANOMETRY N/A 09/19/2019   Procedure: ANO RECTAL MANOMETRY;  Surgeon: Mauri Pole, MD;  [facility];  Service: Endoscopy;  Laterality: N/A;   [place]   BIOPSY  06/10/2020   Procedure: BIOPSY;  Surgeon: Lavena Bullion, DO;  [facility];  Service: Gastroenterology;;  EGD and COLON   BREAST SURGERY     CATARACT EXTRACTION Bilateral 2011   CHOLECYSTECTOMY     COLON RESECTION N/A 06/12/2020   Procedure: LAPAROSCOPIC COLON RESECTION;  Surgeon: [name], MD;  [facility];  Service: General;  Laterality: N/A;   COLONOSCOPY WITH PROPOFOL N/A 06/10/2020   Procedure: COLONOSCOPY WITH PROPOFOL;  Surgeon: Lavena Bullion, DO;  [facility];  Service: Gastroenterology;  Laterality: N/A;   ESOPHAGEAL MANOMETRY N/A 06/21/2018   Procedure: ESOPHAGEAL MANOMETRY (EM);  Surgeon: Lavena Bullion, DO;  [facility];  Service: Gastroenterology;  Laterality: N/A;   ESOPHAGOGASTRODUODENOSCOPY (EGD) WITH PROPOFOL N/A 06/10/2020   Procedure: ESOPHAGOGASTRODUODENOSCOPY (EGD) WITH PROPOFOL;  Surgeon: Lavena Bullion, DO;  [facility];  Service: Gastroenterology;  Laterality: N/A;   GALLBLADDER SURGERY  2015   JOINT REPLACEMENT     RT TOTAL HIP / RT TOTAL KNEE   MASTECTOMY  1998   BILATERAL    [place] IMPEDANCE STUDY  06/21/2018   Procedure: [place] IMPEDANCE STUDY;  Surgeon: Lavena Bullion, DO;  [facility];  Service: Gastroenterology;;   POLYPECTOMY  06/10/2020    Procedure: POLYPECTOMY;  Surgeon: Lavena Bullion, DO;  [facility];  Service: Gastroenterology;;   SKIN CANCER EXCISION  2016   RT SIDE OF NOSE   SUBMUCOSAL TATTOO INJECTION  06/10/2020   Procedure: SUBMUCOSAL TATTOO INJECTION;  Surgeon: Lavena Bullion, DO;  [facility];  Service: Gastroenterology;;   [place]  2010   RIGHT   TOTAL HIP ARTHROPLASTY Left 05/09/2015   Procedure: LEFT TOTAL HIP ARTHROPLASTY ANTERIOR APPROACH;  Surgeon: Gaynelle Arabian, MD;  [facility];  Service: Orthopedics;  Laterality: Left;   TOTAL KNEE ARTHROPLASTY  2001   TUMOR REMOVAL  2012   ABDOMINAL - NON CANCEROUS   Past Surgical History:  Procedure Laterality Date   4 HOUR [place] STUDY N/A 06/21/2018   Procedure: 24 HOUR [place] STUDY;  Surgeon: Lavena Bullion, DO;  [facility];  Service: Gastroenterology;  Laterality: N/A;  with impedance   ABDOMINAL HYSTERECTOMY  2010   ANAL RECTAL MANOMETRY N/A 09/19/2019   Procedure: ANO RECTAL MANOMETRY;  Surgeon: Mauri Pole, MD;  [facility];  Service: Endoscopy;  Laterality: N/A;   [place]   BIOPSY  06/10/2020   Procedure: BIOPSY;  Surgeon: Lavena Bullion, DO;  [facility];  Service: Gastroenterology;;  EGD and COLON   BREAST SURGERY     CATARACT EXTRACTION Bilateral 2011   CHOLECYSTECTOMY     COLON RESECTION N/A 06/12/2020   Procedure: LAPAROSCOPIC COLON RESECTION;  Surgeon: [name], MD;  [facility];  Service: General;  Laterality: N/A;   COLONOSCOPY WITH PROPOFOL N/A 06/10/2020   Procedure: COLONOSCOPY WITH PROPOFOL;  Surgeon: Lavena Bullion, DO;  [facility];  Service: Gastroenterology;  Laterality: N/A;   ESOPHAGEAL MANOMETRY N/A 06/21/2018   Procedure: ESOPHAGEAL MANOMETRY (EM);  Surgeon: Lavena Bullion, DO;  [facility];  Service: Gastroenterology;  Laterality: N/A;    ESOPHAGOGASTRODUODENOSCOPY (EGD) WITH PROPOFOL N/A 06/10/2020   Procedure: ESOPHAGOGASTRODUODENOSCOPY (EGD) WITH PROPOFOL;  Surgeon: Lavena Bullion, DO;  [facility];  Service: Gastroenterology;  Laterality: N/A;   GALLBLADDER SURGERY  2015   JOINT REPLACEMENT     RT TOTAL HIP / RT TOTAL KNEE   MASTECTOMY  1998   BILATERAL    [place] IMPEDANCE STUDY  06/21/2018   Procedure: [place] IMPEDANCE STUDY;  Surgeon: Lavena Bullion, DO;  [facility];  Service: Gastroenterology;;   POLYPECTOMY  06/10/2020   Procedure: POLYPECTOMY;  Surgeon: Lavena Bullion, DO;  [facility];  Service: Gastroenterology;;   SKIN CANCER EXCISION  2016   RT SIDE OF NOSE   SUBMUCOSAL TATTOO INJECTION  06/10/2020   Procedure:  SUBMUCOSAL TATTOO INJECTION;  Surgeon: Lavena Bullion, DO;  [facility];  Service: Gastroenterology;;   [place]  2010   RIGHT   TOTAL HIP ARTHROPLASTY Left 05/09/2015   Procedure: LEFT TOTAL HIP ARTHROPLASTY ANTERIOR APPROACH;  Surgeon: Gaynelle Arabian, MD;  [facility];  Service: Orthopedics;  Laterality: Left;   TOTAL KNEE ARTHROPLASTY  2001   TUMOR REMOVAL  2012   ABDOMINAL - NON CANCEROUS   Past Medical History:  Diagnosis Date   Arthritis    Cancer ([place])    HX BREAST CANCER/ SKIN CANCER   Complication of anesthesia    N/V WITH MORPHINE   Difficulty sleeping    Fractured hip ([place])    LEFT - AUG 2016   GERD (gastroesophageal reflux disease)    Hyperlipidemia    Hypertension    Melanoma ([place])    Neuropathy    Nocturia    Osteopenia    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   PONV (postoperative nausea and vomiting)    PT STATES MORPHINE CAUSED N/V   Rosacea    Sleep apnea    Stage 1 breast cancer, ER+, right ([place]) 07/04/2017   BP 139/79 (BP Location: Right Arm, Patient Position: Sitting, Cuff Size: Large)   Pulse 84   Temp 98.1 F (36.7 C) (Oral)   Ht 5\' 6"  (1.676 m)   Wt 213 lb 6.4  oz (96.8 kg)   SpO2 96%   BMI 34.44 kg/m   Opioid Risk Score:   Fall Risk Score:  `1  Depression screen PHQ 2/9  Depression screen [facility] 2/9 09/22/2020 08/11/2020 05/20/2020 12/24/2019 11/22/2018 09/28/2018 06/02/2018  Decreased Interest 0 0 0 0 0 0 0  Down, Depressed, Hopeless 0 0 0 0 0 0 0  PHQ - 2 Score 0 0 0 0 0 0 0  Altered sleeping - - - - - - -  Tired, decreased energy - - - - - - -  Change in appetite - - - - - - -  Feeling bad or failure about yourself  - - - - - - -  Trouble concentrating - - - - - - -  Moving slowly or fidgety/restless - - - - - - -  Suicidal thoughts - - - - - - -  PHQ-9 Score - - - - - - -  Difficult doing work/chores - - - - - - -  Some recent data might be hidden     Review of Systems  Constitutional: Negative.   HENT: Negative.    Eyes: Negative.   Respiratory: Negative.    Cardiovascular: Negative.   Gastrointestinal: Negative.   Endocrine: Negative.   Genitourinary: Negative.   Musculoskeletal:  Positive for back pain and gait problem.       Right and left leg pain  Allergic/Immunologic: Negative.   Hematological: Negative.   Psychiatric/Behavioral: Negative.        Objective:   Physical Exam Vitals and nursing note reviewed.  Constitutional:      Appearance: Normal appearance.  Cardiovascular:     Rate and Rhythm: Normal rate and regular rhythm.     Pulses: Normal pulses.     Heart sounds: Normal heart sounds.  Pulmonary:     Effort: Pulmonary effort is normal.     Breath sounds: Normal breath sounds.  Musculoskeletal:     Cervical back: Normal range of motion and neck supple.     Comments: Normal Muscle  Bulk and Muscle Testing Reveals:  Upper Extremities: Full ROM and Muscle Strength  5/5 Lumbar Paraspinal Tenderness: L-3-L-5 Lower Extremities : Full ROM and Muscle Strength 5/5 Arises from chair slowly using walker for support Antalgic Gait     Skin:    General: Skin is warm and dry.  Neurological:     Mental Status: She is  alert and oriented to person, place, and time.  Psychiatric:        Mood and Affect: Mood normal.        Behavior: Behavior normal.         Assessment & Plan:  1. Chronic Bilateral Leg pain: Paresthesia: Continue to Monitor. 11/11/2020 2. Paresthesia Doreene Burke Radiculitis: Ms. Souders has weaned herself off the Lyrica due to daytime drowsiness. We will continue to monitor. 11/11/2020 3. Pain of Left Wrist/ : No complaints today. S/P Carpal Tunnel Release on 06/03/2017 by Dr. Ellene Route. Dr. Ellene Route Following. 11/11/2020. 4. Fracture of superior pubic ramus.  Dr. Wynelle Link Following.  Continue to monitor. 11/11/2020. 5. Bilateral Knee OA: Continue Voltaren Gel.  Continue to monitor. Orthopedist following. 11/11/2020 6. Polyarthralgia: Continue to alternate with heat and ice therapy. Continue current medication regime. Continue to monitor. 11/11/2020. 7. Chronic Pain Syndrome: Refilled::Hydrocodone 10/325 mg one tablet every 6 hours as needed for moderate pain #120. [id number]. 8. Lumbar Compression Fracture L2 and L3:  Ms. Ocasio refused physical therapy. Continue with rest/ heat therapy. Continue to Monitor 11/11/2020. 9. Muscle Spasm: Continue  Flexeril 5 mg at HS. Continue to monitor. 11/11/2020.   F/U in 1 Month

## 2020-11-13 ENCOUNTER — Encounter: Payer: Medicare Other | Admitting: Registered Nurse

## 2020-11-18 DIAGNOSIS — H1820 Unspecified corneal edema: Secondary | ICD-10-CM | POA: Diagnosis not present

## 2020-11-19 ENCOUNTER — Other Ambulatory Visit (HOSPITAL_COMMUNITY): Payer: Self-pay

## 2020-11-19 MED FILL — Capecitabine Tab 500 MG: ORAL | 14 days supply | Qty: 112 | Fill #2 | Status: AC

## 2020-12-02 ENCOUNTER — Inpatient Hospital Stay: Payer: Medicare Other | Attending: Hematology & Oncology

## 2020-12-02 ENCOUNTER — Inpatient Hospital Stay: Payer: Medicare Other | Admitting: Family

## 2020-12-02 ENCOUNTER — Encounter: Payer: Self-pay | Admitting: *Deleted

## 2020-12-02 ENCOUNTER — Encounter: Payer: Self-pay | Admitting: Hematology & Oncology

## 2020-12-02 ENCOUNTER — Inpatient Hospital Stay (HOSPITAL_BASED_OUTPATIENT_CLINIC_OR_DEPARTMENT_OTHER): Payer: Medicare Other | Admitting: Hematology & Oncology

## 2020-12-02 ENCOUNTER — Other Ambulatory Visit: Payer: Self-pay

## 2020-12-02 ENCOUNTER — Inpatient Hospital Stay: Payer: Medicare Other

## 2020-12-02 VITALS — BP 140/57 | HR 73 | Temp 98.0°F | Resp 19 | Wt 209.0 lb

## 2020-12-02 DIAGNOSIS — C779 Secondary and unspecified malignant neoplasm of lymph node, unspecified: Secondary | ICD-10-CM | POA: Diagnosis not present

## 2020-12-02 DIAGNOSIS — Z79899 Other long term (current) drug therapy: Secondary | ICD-10-CM | POA: Diagnosis not present

## 2020-12-02 DIAGNOSIS — R197 Diarrhea, unspecified: Secondary | ICD-10-CM | POA: Diagnosis not present

## 2020-12-02 DIAGNOSIS — C186 Malignant neoplasm of descending colon: Secondary | ICD-10-CM | POA: Diagnosis not present

## 2020-12-02 DIAGNOSIS — Z853 Personal history of malignant neoplasm of breast: Secondary | ICD-10-CM | POA: Diagnosis not present

## 2020-12-02 DIAGNOSIS — M810 Age-related osteoporosis without current pathological fracture: Secondary | ICD-10-CM | POA: Insufficient documentation

## 2020-12-02 DIAGNOSIS — C184 Malignant neoplasm of transverse colon: Secondary | ICD-10-CM | POA: Diagnosis not present

## 2020-12-02 LAB — CBC WITH DIFFERENTIAL (CANCER CENTER ONLY)
Abs Immature Granulocytes: 0.01 10*3/uL (ref 0.00–0.07)
Basophils Absolute: 0 10*3/uL (ref 0.0–0.1)
Basophils Relative: 1 %
Eosinophils Absolute: 0.2 10*3/uL (ref 0.0–0.5)
Eosinophils Relative: 3 %
HCT: 41 % (ref 36.0–46.0)
Hemoglobin: 13.3 g/dL (ref 12.0–15.0)
Immature Granulocytes: 0 %
Lymphocytes Relative: 35 %
Lymphs Abs: 2.3 10*3/uL (ref 0.7–4.0)
MCH: 32.8 pg (ref 26.0–34.0)
MCHC: 32.4 g/dL (ref 30.0–36.0)
MCV: 101 fL — ABNORMAL HIGH (ref 80.0–100.0)
Monocytes Absolute: 0.5 10*3/uL (ref 0.1–1.0)
Monocytes Relative: 8 %
Neutro Abs: 3.4 10*3/uL (ref 1.7–7.7)
Neutrophils Relative %: 53 %
Platelet Count: 116 10*3/uL — ABNORMAL LOW (ref 150–400)
RBC: 4.06 MIL/uL (ref 3.87–5.11)
RDW: 15.1 % (ref 11.5–15.5)
WBC Count: 6.4 10*3/uL (ref 4.0–10.5)
nRBC: 0 % (ref 0.0–0.2)

## 2020-12-02 LAB — CMP (CANCER CENTER ONLY)
ALT: 13 U/L (ref 0–44)
AST: 22 U/L (ref 15–41)
Albumin: 4.2 g/dL (ref 3.5–5.0)
Alkaline Phosphatase: 74 U/L (ref 38–126)
Anion gap: 9 (ref 5–15)
BUN: 14 mg/dL (ref 8–23)
CO2: 27 mmol/L (ref 22–32)
Calcium: 10.2 mg/dL (ref 8.9–10.3)
Chloride: 104 mmol/L (ref 98–111)
Creatinine: 0.66 mg/dL (ref 0.44–1.00)
GFR, Estimated: >60 mL/min (ref >60)
Glucose, Bld: 110 mg/dL — ABNORMAL HIGH (ref 70–99)
Potassium: 4.1 mmol/L (ref 3.5–5.1)
Sodium: 140 mmol/L (ref 135–145)
Total Bilirubin: 0.7 mg/dL (ref 0.3–1.2)
Total Protein: 7 g/dL (ref 6.5–8.1)

## 2020-12-02 LAB — LACTATE DEHYDROGENASE: LDH: 174 U/L (ref 98–192)

## 2020-12-02 NOTE — Progress Notes (Signed)
Oncology Nurse Navigator Documentation  Oncology Nurse Navigator Flowsheets 12/02/2020  Confirmed Diagnosis Date -  Diagnosis Status -  Planned Course of Treatment -  Phase of Treatment -  Chemotherapy Pending- Reason: -  Chemotherapy Actual Start Date: -  Navigator Follow Up Date: 01/06/2021  Navigator Follow Up Reason: Follow-up Appointment  Navigator Location [facility]  Navigator Encounter Type Appt/Treatment Plan Review  Telephone -  Treatment Initiated Date -  Patient Visit Type MedOnc  Treatment Phase Active Tx  Barriers/Navigation Needs Coordination of Care;Education;Anxiety;Transportation  Education -  Interventions None Required  Acuity Level 2-Minimal Needs (1-2 Barriers Identified)  Coordination of Care -  Education Method -  Time Spent with Patient 15

## 2020-12-02 NOTE — Progress Notes (Signed)
Hematology and Oncology Follow Up Visit  Airel Magadan [id number] 1934/04/13 87 y.o. 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 Year -- next dose on 11/2020   Interim History:  Ms. Bhavsar is here today for follow-up.  She seems to be doing better.  She is having mild diarrhea with the Xeloda.  We adjusted her Xeloda dose.  She thinks that the key for her getting better was goat milk.  She said when she started drinking goat milk, she began to feel better..  She has 1 last cycle of Xeloda.  She is determined to finish this.  I applaud her for her enthusiasm and for motivation.  She had a very quiet July 4 holiday.  She was at home.  She has had no problems with mouth sores.  There has been no cough or shortness of breath.  She has had no nausea.  She is eating okay.  There is no leg swelling.  She has had no rashes.  Overall, her performance status is [name].    Medications:  Allergies as of 12/02/2020       Reactions   Darvon [propoxyphene] Nausea And Vomiting, Palpitations   Darvocet Causes Sweats   Adhesive [tape] Rash   Morphine And Related Nausea And Vomiting        Medication List        Accurate as of December 02, 2020  2:13 PM. If you have any questions, ask your nurse or doctor.          capecitabine 500 MG tablet Commonly known as: XELODA TAKE 4 TABLETS BY MOUTH 2 TIMES DAILY AFTER A MEAL. TAKE FOR 14 DAYS, THEN HOLD FOR 7 DAYS. REPEAT EVERY 21 DAYS.   cyclobenzaprine 5 MG tablet Commonly known as: FLEXERIL Take 1 tablet (5 mg total) by mouth at bedtime as needed for muscle spasms.   diphenoxylate-atropine 2.5-0.025 MG tablet Commonly known  as: LOMOTIL Take 1 tablet by mouth 4 (four) times daily as needed for diarrhea or loose stools.   estradiol 0.1 MG/GM vaginal cream Commonly known as: ESTRACE VAGINAL Apply 1gm vaginally 1-3x a week as needed for comfort   fenofibrate 48 MG tablet Commonly known as: Tricor Take 1 tablet (48 mg total) by mouth daily.   HYDROcodone-acetaminophen 10-325 MG tablet Commonly known as: NORCO Take 1 tablet by mouth every 6 (six) hours as needed.   ipratropium 0.03 % nasal spray Commonly known as: ATROVENT Place 2 sprays into both nostrils 4 (four) times daily as needed for rhinitis.   loperamide 2 MG tablet Commonly known as: IMODIUM A-D Take 2 mg by mouth 4 (four) times daily as needed for diarrhea or loose stools.   losartan 25 MG tablet Commonly known as: COZAAR Take 1 tablet (25 mg total) by mouth daily.   Multivitamin Adults Tabs Take 1 tablet by mouth daily.   naloxegol oxalate 12.5 MG Tabs tablet Commonly known as: MOVANTIK Take 1 tablet (12.5 mg total) by mouth daily.   neomycin-polymyxin b-dexamethasone 3.5-10000-0.1 Susp Commonly known as: MAXITROL Place 1 drop into the left eye daily as needed.   promethazine 12.5 MG tablet Commonly known as:  PHENERGAN Take 1 tablet (12.5 mg total) by mouth every 6 (six) hours as needed for nausea or vomiting.   valACYclovir 1000 MG tablet Commonly known as: VALTREX Take 1,000 mg by mouth daily.        Allergies:  Allergies  Allergen Reactions   Darvon [Propoxyphene] Nausea And Vomiting and Palpitations    Darvocet Causes Sweats   Adhesive [Tape] Rash   Morphine And Related Nausea And Vomiting    Past Medical History, Surgical history, Social history, and Family History were reviewed and updated.  Review of Systems: Review of Systems  Constitutional: Negative.   HENT: Negative.    Eyes: Negative.   Respiratory: Negative.    Cardiovascular: Negative.   Gastrointestinal: Negative.   Genitourinary: Negative.    Musculoskeletal: Negative.   Skin: Negative.   Neurological: Negative.   Endo/Heme/Allergies: Negative.   Psychiatric/Behavioral: Negative.      Physical Exam:  weight is 209 lb (94.8 kg). Her oral temperature is 98 F (36.7 C). Her blood pressure is 140/57 (abnormal) and her pulse is 73. Her respiration is 19 and oxygen saturation is 98%.   Wt Readings from Last 3 Encounters:  12/02/20 209 lb (94.8 kg)  11/11/20 213 lb 6.4 oz (96.8 kg)  10/16/20 215 lb 12.8 oz (97.9 kg)    Physical Exam Vitals reviewed.  HENT:     Head: Normocephalic and atraumatic.  Eyes:     Pupils: Pupils are equal, round, and reactive to light.  Cardiovascular:     Rate and Rhythm: Normal rate and regular rhythm.     Heart sounds: Normal heart sounds.  Pulmonary:     Effort: Pulmonary effort is normal.     Breath sounds: Normal breath sounds.  Abdominal:     General: Bowel sounds are normal.     Palpations: Abdomen is soft.  Musculoskeletal:        General: No tenderness or deformity. Normal range of motion.     Cervical back: Normal range of motion.  Lymphadenopathy:     Cervical: No cervical adenopathy.  Skin:    General: Skin is warm and dry.     Findings: No erythema or rash.  Neurological:     Mental Status: She is alert and oriented to person, place, and time.  Psychiatric:        Behavior: Behavior normal.        Thought Content: Thought content normal.        Judgment: Judgment normal.    Lab Results  Component Value Date   WBC 6.4 12/02/2020   HGB 13.3 12/02/2020   HCT 41.0 12/02/2020   MCV 101.0 (H) 12/02/2020   PLT 116 (L) 12/02/2020   Lab Results  Component Value Date   FERRITIN 45 06/09/2020   IRON 49 06/09/2020   TIBC 320 06/09/2020   UIBC 271 06/09/2020   IRONPCTSAT 15 06/09/2020   Lab Results  Component Value Date   RETICCTPCT 1.6 06/09/2020   RBC 4.06 12/02/2020   No results found for: [name], [facility] Lab Results  Component Value Date    IGGSERUM 1,085 02/09/2017   IGMSERUM 336 (H) 02/09/2017   No results found for: Ronnald Ramp, A1GS, Gilford Silvius, MSPIKE, SPEI   Chemistry      Component Value Date/Time   NA 140 12/02/2020 1308   NA 139 02/25/2020 1627   K 4.1 12/02/2020 1308   CL 104 12/02/2020 1308   CO2 27 12/02/2020 1308   BUN 14  12/02/2020 1308   BUN 11 02/25/2020 1627   CREATININE 0.66 12/02/2020 1308      Component Value Date/Time   CALCIUM 10.2 12/02/2020 1308   ALKPHOS 74 12/02/2020 1308   AST 22 12/02/2020 1308   ALT 13 12/02/2020 1308   BILITOT 0.7 12/02/2020 1308       Impression and Plan:  Ms. Lumb is a very pleasant 87 yo caucasian female with history of bilateral stage Ia breast cancer diagnosed back in 1998. She now has locally advanced colon cancer stage IIIb with 3+ lymph nodes.  She will go ahead and finish up the last cycle of Xeloda.  Overall, I think she is done very nicely.  Once she completes his sixth and final cycle of treatment, we will then follow-up with scans.  I told her that we would do scans the first year every 4 months.  After that every 6 months.  I am just happy that she is doing better.  I am happy that the goat milk is helping her out.  I know that she has had goat milk before.  This was during I think World War II.  She really hated it back then.  She says she would never drink again but found out that it was worthwhile handling the taste if her diarrhea improved.  I am just very impressed with her motivation.  She is so focused and so determined to be cancer.  I know that she is already being breast cancer.  I think that her chance of bleeding colon cancer is going to be quite good.  I would like to see her back in about 4 or 5 weeks.  I would probably plan for her first scan sometime in August.  [name], MD 7/5/20222:13 PM

## 2020-12-03 LAB — CEA (IN HOUSE-CHCC): CEA (CHCC-In House): 2.03 ng/mL (ref 0.00–5.00)

## 2020-12-08 ENCOUNTER — Encounter: Payer: Self-pay | Admitting: Hematology & Oncology

## 2020-12-08 ENCOUNTER — Other Ambulatory Visit (HOSPITAL_COMMUNITY): Payer: Self-pay

## 2020-12-09 ENCOUNTER — Encounter: Payer: Self-pay | Admitting: Registered Nurse

## 2020-12-09 ENCOUNTER — Other Ambulatory Visit: Payer: Self-pay

## 2020-12-09 ENCOUNTER — Encounter: Payer: Medicare Other | Attending: Physical Medicine & Rehabilitation | Admitting: Registered Nurse

## 2020-12-09 ENCOUNTER — Encounter: Payer: Self-pay | Admitting: Hematology & Oncology

## 2020-12-09 VITALS — BP 133/70 | HR 82 | Temp 98.0°F | Ht 66.0 in | Wt 207.2 lb

## 2020-12-09 DIAGNOSIS — M7581 Other shoulder lesions, right shoulder: Secondary | ICD-10-CM

## 2020-12-09 DIAGNOSIS — M778 Other enthesopathies, not elsewhere classified: Secondary | ICD-10-CM | POA: Diagnosis not present

## 2020-12-09 DIAGNOSIS — M961 Postlaminectomy syndrome, not elsewhere classified: Secondary | ICD-10-CM | POA: Diagnosis not present

## 2020-12-09 DIAGNOSIS — G894 Chronic pain syndrome: Secondary | ICD-10-CM | POA: Diagnosis not present

## 2020-12-09 DIAGNOSIS — Z79899 Other long term (current) drug therapy: Secondary | ICD-10-CM | POA: Diagnosis not present

## 2020-12-09 DIAGNOSIS — M2550 Pain in unspecified joint: Secondary | ICD-10-CM | POA: Diagnosis not present

## 2020-12-09 DIAGNOSIS — Z5181 Encounter for therapeutic drug level monitoring: Secondary | ICD-10-CM | POA: Diagnosis not present

## 2020-12-09 DIAGNOSIS — R202 Paresthesia of skin: Secondary | ICD-10-CM | POA: Diagnosis not present

## 2020-12-09 DIAGNOSIS — M7061 Trochanteric bursitis, right hip: Secondary | ICD-10-CM

## 2020-12-09 DIAGNOSIS — M7062 Trochanteric bursitis, left hip: Secondary | ICD-10-CM | POA: Insufficient documentation

## 2020-12-09 DIAGNOSIS — M170 Bilateral primary osteoarthritis of knee: Secondary | ICD-10-CM

## 2020-12-09 DIAGNOSIS — M62838 Other muscle spasm: Secondary | ICD-10-CM | POA: Diagnosis not present

## 2020-12-09 DIAGNOSIS — S32000S Wedge compression fracture of unspecified lumbar vertebra, sequela: Secondary | ICD-10-CM | POA: Diagnosis not present

## 2020-12-09 MED ORDER — HYDROCODONE-ACETAMINOPHEN 10-325 MG PO TABS: 1.0000 | ORAL_TABLET | Freq: Four times a day (QID) | ORAL | 0 refills | Status: DC | PRN

## 2020-12-09 NOTE — Progress Notes (Signed)
Subjective:    Patient ID: Crystal Lee, female    DOB: 04-04-1934, 87 y.o.   MRN: [id number]  HPI: Crystal Lee is a 87 y.o. female who returns for follow up appointment for chronic pain and medication refill. She states her pain is located in her right shoulder, lower back, bilateral hips and bilateral lower extremities. She rates her pain 5. Her current exercise regime is walking and performing stretching exercises.  Crystal Lee equivalent is 40.00  MME.  Oral Swab was Performed today.      Pain Inventory Average Pain 5 Pain Right Now 5 My pain is constant, sharp, burning, tingling, and aching  In the last 24 hours, has pain interfered with the following? General activity 0 Relation with others 0 Enjoyment of life 0 What TIME of day is your pain at its worst? morning , daytime, evening, and night Sleep (in general) Poor  Pain is worse with: walking, standing, and some activites Pain improves with: rest, medication, and HEAT Relief from Meds: 4  Family History  Problem Relation Age of Onset   Other Mother        complications from flu   Other Father        unsure of cause   Colon cancer Neg Hx    Social History   Socioeconomic History   Marital status: Widowed    Spouse name: Not on file   Number of children: 3   Years of education: 16 years   Highest education level: Not on file  Occupational History   Occupation: Retired  Tobacco Use   Smoking status: Former    Packs/day: 0.50    Years: 20.00    Pack years: 10.00    Types: Cigarettes    Quit date: 02/03/1976    Years since quitting: 44.8   Smokeless tobacco: Never  Vaping Use   Vaping Use: Never used  Substance and Sexual Activity   Alcohol use: No   Drug use: No   Sexual activity: Yes    Birth control/protection: Post-menopausal  Other Topics Concern   Not on file  Social History Narrative   Lives at home with husband.   Right-handed.   No caffeine use.   Social Determinants of Health    Financial Resource Strain: Not on file  Food Insecurity: Not on file  Transportation Needs: Not on file  Physical Activity: Not on file  Stress: Not on file  Social Connections: Not on file   Past Surgical History:  Procedure Laterality Date   51 HOUR [place] STUDY N/A 06/21/2018   Procedure: 24 HOUR [place];  Surgeon: Lavena Bullion, DO;  [facility];  Service: Gastroenterology;  Laterality: N/A;  with impedance   ABDOMINAL HYSTERECTOMY  2010   ANAL RECTAL MANOMETRY N/A 09/19/2019   Procedure: ANO RECTAL MANOMETRY;  Surgeon: Mauri Pole, MD;  [facility];  Service: Endoscopy;  Laterality: N/A;   [place]   BIOPSY  06/10/2020   Procedure: BIOPSY;  Surgeon: Lavena Bullion, DO;  [facility];  Service: Gastroenterology;;  EGD and COLON   BREAST SURGERY     CATARACT EXTRACTION Bilateral 2011   CHOLECYSTECTOMY     COLON RESECTION N/A 06/12/2020   Procedure: LAPAROSCOPIC COLON RESECTION;  Surgeon: [name], MD;  [facility];  Service: General;  Laterality: N/A;   COLONOSCOPY WITH PROPOFOL N/A 06/10/2020   Procedure: COLONOSCOPY WITH PROPOFOL;  Surgeon: Gerrit Heck  V, DO;  [facility];  Service: Gastroenterology;  Laterality: N/A;   ESOPHAGEAL MANOMETRY N/A 06/21/2018   Procedure: ESOPHAGEAL MANOMETRY (EM);  Surgeon: Lavena Bullion, DO;  [facility];  Service: Gastroenterology;  Laterality: N/A;   ESOPHAGOGASTRODUODENOSCOPY (EGD) WITH PROPOFOL N/A 06/10/2020   Procedure: ESOPHAGOGASTRODUODENOSCOPY (EGD) WITH PROPOFOL;  Surgeon: Lavena Bullion, DO;  [facility];  Service: Gastroenterology;  Laterality: N/A;   GALLBLADDER SURGERY  2015   JOINT REPLACEMENT     RT TOTAL HIP / RT TOTAL KNEE   MASTECTOMY  1998   BILATERAL    [place] IMPEDANCE STUDY  06/21/2018   Procedure: [place] IMPEDANCE STUDY;  Surgeon: Lavena Bullion, DO;  [facility];  Service:  Gastroenterology;;   POLYPECTOMY  06/10/2020   Procedure: POLYPECTOMY;  Surgeon: Lavena Bullion, DO;  [facility];  Service: Gastroenterology;;   SKIN CANCER EXCISION  2016   RT SIDE OF NOSE   SUBMUCOSAL TATTOO INJECTION  06/10/2020   Procedure: SUBMUCOSAL TATTOO INJECTION;  Surgeon: Lavena Bullion, DO;  [facility];  Service: Gastroenterology;;   [place]  2010   RIGHT   TOTAL HIP ARTHROPLASTY Left 05/09/2015   Procedure: LEFT TOTAL HIP ARTHROPLASTY ANTERIOR APPROACH;  Surgeon: Gaynelle Arabian, MD;  [facility];  Service: Orthopedics;  Laterality: Left;   TOTAL KNEE ARTHROPLASTY  2001   TUMOR REMOVAL  2012   ABDOMINAL - NON CANCEROUS   Past Surgical History:  Procedure Laterality Date   88 HOUR [place] STUDY N/A 06/21/2018   Procedure: 24 HOUR [place] STUDY;  Surgeon: Lavena Bullion, DO;  [facility];  Service: Gastroenterology;  Laterality: N/A;  with impedance   ABDOMINAL HYSTERECTOMY  2010   ANAL RECTAL MANOMETRY N/A 09/19/2019   Procedure: ANO RECTAL MANOMETRY;  Surgeon: Mauri Pole, MD;  [facility];  Service: Endoscopy;  Laterality: N/A;   [place]   BIOPSY  06/10/2020   Procedure: BIOPSY;  Surgeon: Lavena Bullion, DO;  [facility];  Service: Gastroenterology;;  EGD and COLON   BREAST SURGERY     CATARACT EXTRACTION Bilateral 2011   CHOLECYSTECTOMY     COLON RESECTION N/A 06/12/2020   Procedure: LAPAROSCOPIC COLON RESECTION;  Surgeon: [name], MD;  [facility];  Service: General;  Laterality: N/A;   COLONOSCOPY WITH PROPOFOL N/A 06/10/2020   Procedure: COLONOSCOPY WITH PROPOFOL;  Surgeon: Lavena Bullion, DO;  [facility];  Service: Gastroenterology;  Laterality: N/A;   ESOPHAGEAL MANOMETRY N/A 06/21/2018   Procedure: ESOPHAGEAL MANOMETRY (EM);  Surgeon: Lavena Bullion, DO;  [facility];  Service:  Gastroenterology;  Laterality: N/A;   ESOPHAGOGASTRODUODENOSCOPY (EGD) WITH PROPOFOL N/A 06/10/2020   Procedure: ESOPHAGOGASTRODUODENOSCOPY (EGD) WITH PROPOFOL;  Surgeon: Lavena Bullion, DO;  [facility];  Service: Gastroenterology;  Laterality: N/A;   GALLBLADDER SURGERY  2015   JOINT REPLACEMENT     RT TOTAL HIP / RT TOTAL KNEE   MASTECTOMY  1998   BILATERAL    [place] IMPEDANCE STUDY  06/21/2018   Procedure: [place] IMPEDANCE STUDY;  Surgeon: Lavena Bullion, DO;  [facility];  Service: Gastroenterology;;   POLYPECTOMY  06/10/2020   Procedure: POLYPECTOMY;  Surgeon: Lavena Bullion, DO;  [facility];  Service: Gastroenterology;;   SKIN CANCER EXCISION  2016   RT SIDE OF NOSE   SUBMUCOSAL TATTOO INJECTION  06/10/2020  Procedure: SUBMUCOSAL TATTOO INJECTION;  Surgeon: Lavena Bullion, DO;  [facility];  Service: Gastroenterology;;   [place]  2010   RIGHT   TOTAL HIP ARTHROPLASTY Left 05/09/2015   Procedure: LEFT TOTAL HIP ARTHROPLASTY ANTERIOR APPROACH;  Surgeon: Gaynelle Arabian, MD;  [facility];  Service: Orthopedics;  Laterality: Left;   TOTAL KNEE ARTHROPLASTY  2001   TUMOR REMOVAL  2012   ABDOMINAL - NON CANCEROUS   Past Medical History:  Diagnosis Date   Arthritis    Cancer ([place])    HX BREAST CANCER/ SKIN CANCER   Complication of anesthesia    N/V WITH Lee   Difficulty sleeping    Fractured hip ([place])    LEFT - AUG 2016   GERD (gastroesophageal reflux disease)    Hyperlipidemia    Hypertension    Melanoma ([place])    Neuropathy    Nocturia    Osteopenia    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   PONV (postoperative nausea and vomiting)    PT STATES Lee CAUSED N/V   Rosacea    Sleep apnea    Stage 1 breast cancer, ER+, right (HCC) 07/04/2017   BP 133/70   Pulse 82   Temp 98 F (36.7 C)   Ht 5\' 6"  (1.676 m)   Wt 207 lb 3.2 oz (94 kg)   SpO2 95%   BMI 33.44  kg/m   Opioid Risk Score:   Fall Risk Score:  `1  Depression screen PHQ 2/9  Depression screen [facility] 2/9 09/22/2020 08/11/2020 05/20/2020 12/24/2019 11/22/2018 09/28/2018 06/02/2018  Decreased Interest 0 0 0 0 0 0 0  Down, Depressed, Hopeless 0 0 0 0 0 0 0  PHQ - 2 Score 0 0 0 0 0 0 0  Altered sleeping - - - - - - -  Tired, decreased energy - - - - - - -  Change in appetite - - - - - - -  Feeling bad or failure about yourself  - - - - - - -  Trouble concentrating - - - - - - -  Moving slowly or fidgety/restless - - - - - - -  Suicidal thoughts - - - - - - -  PHQ-9 Score - - - - - - -  Difficult doing work/chores - - - - - - -  Some recent data might be hidden    Review of Systems  Musculoskeletal:  Positive for back pain.       Pain in the back & legs  All other systems reviewed and are negative.     Objective:   Physical Exam Vitals and nursing note reviewed.  Constitutional:      Appearance: Normal appearance.  Cardiovascular:     Rate and Rhythm: Normal rate and regular rhythm.     Pulses: Normal pulses.     Heart sounds: Normal heart sounds.  Pulmonary:     Effort: Pulmonary effort is normal.     Breath sounds: Normal breath sounds.  Musculoskeletal:     Cervical back: Normal range of motion and neck supple.     Comments: Normal Muscle Bulk and Muscle Testing Reveals:  Upper Extremities: Full ROM and Muscle Strength  5/5 Right AC Joint Tenderness  Lumbar Paraspinal Tenderness: L-4-L-5 Bilateral Greater Trochanter Tenderness Lower Extremities: Right: Full ROM and Muscle Strength 5/5 Left Lower Extremity: Decreased ROM and Muscle Strength 5/5 Left Lower Extremity Flexion Produces Pain into  her left Patella Arises from Table slowly using cane for support Narrow Based  Gait     Skin:    General: Skin is warm and dry.  Neurological:     Mental Status: She is alert and oriented to person, place, and time.  Psychiatric:        Mood and Affect: Mood normal.         Behavior: Behavior normal.         Assessment & Plan:  1. Chronic Bilateral Leg pain: Paresthesia: Continue to Monitor. 12/09/2020 2. Paresthesia Doreene Burke Radiculitis: Crystal Lee has weaned herself off the Lyrica due to daytime drowsiness. We will continue to monitor. 12/09/2020 3. Pain of Left Wrist/ : No complaints today. S/P Carpal Tunnel Release on 06/03/2017 by Dr. Ellene Route. Dr. Ellene Route Following. 12/09/2020. 4. Fracture of superior pubic ramus.  Dr. Wynelle Link Following.  Continue to monitor. 12/09/2020. 5. Bilateral Knee OA: Continue Voltaren Gel.  Continue to monitor. Orthopedist following. 12/09/2020 6. Polyarthralgia: Continue to alternate with heat and ice therapy. Continue current medication regime. Continue to monitor. 12/09/2020. 7. Chronic Pain Syndrome: Refilled::Hydrocodone 10/325 mg one tablet every 6 hours as needed for moderate pain #120. 12/09/2020. 8. Lumbar Compression Fracture L2 and L3:  Crystal Lee refused physical therapy. Continue with rest/ heat therapy. Continue to Monitor 12/09/2020. 9. Muscle Spasm: Continue  Flexeril 5 mg at HS. Continue to monitor. 12/09/2020. 10. Right Shoulder Tendonitis: Continue HEP as Tolerated. Alternate Ice and Heat Therapy.   F/U in 1 Month

## 2020-12-10 ENCOUNTER — Inpatient Hospital Stay: Payer: Medicare Other

## 2020-12-10 VITALS — BP 147/67 | HR 73 | Temp 98.0°F | Ht 66.0 in | Wt 207.1 lb

## 2020-12-10 DIAGNOSIS — M8589 Other specified disorders of bone density and structure, multiple sites: Secondary | ICD-10-CM

## 2020-12-10 DIAGNOSIS — M810 Age-related osteoporosis without current pathological fracture: Secondary | ICD-10-CM | POA: Diagnosis not present

## 2020-12-10 DIAGNOSIS — T386X5A Adverse effect of antigonadotrophins, antiestrogens, antiandrogens, not elsewhere classified, initial encounter: Secondary | ICD-10-CM

## 2020-12-10 DIAGNOSIS — R197 Diarrhea, unspecified: Secondary | ICD-10-CM | POA: Diagnosis not present

## 2020-12-10 DIAGNOSIS — C779 Secondary and unspecified malignant neoplasm of lymph node, unspecified: Secondary | ICD-10-CM | POA: Diagnosis not present

## 2020-12-10 DIAGNOSIS — C184 Malignant neoplasm of transverse colon: Secondary | ICD-10-CM | POA: Diagnosis not present

## 2020-12-10 DIAGNOSIS — Z853 Personal history of malignant neoplasm of breast: Secondary | ICD-10-CM | POA: Diagnosis not present

## 2020-12-10 DIAGNOSIS — M818 Other osteoporosis without current pathological fracture: Secondary | ICD-10-CM

## 2020-12-10 DIAGNOSIS — Z79899 Other long term (current) drug therapy: Secondary | ICD-10-CM | POA: Diagnosis not present

## 2020-12-10 MED ORDER — SODIUM CHLORIDE 0.9 % IV SOLN
Freq: Once | INTRAVENOUS | Status: AC
  Filled 2020-12-10: qty 250

## 2020-12-10 MED ORDER — ZOLEDRONIC ACID 4 MG/100ML IV SOLN
4.0000 mg | Freq: Once | INTRAVENOUS | Status: AC
  Administered 2020-12-10: 4 mg via INTRAVENOUS
  Filled 2020-12-10: qty 100

## 2020-12-10 NOTE — Patient Instructions (Signed)

## 2020-12-15 LAB — DRUG TOX MONITOR 1 W/CONF, ORAL FLD
Amphetamines: NEGATIVE ng/mL (ref <10)
Barbiturates: NEGATIVE ng/mL (ref <10)
Benzodiazepines: NEGATIVE ng/mL (ref <0.50)
Buprenorphine: NEGATIVE ng/mL (ref <0.10)
Cocaine: NEGATIVE ng/mL (ref <5.0)
Codeine: NEGATIVE ng/mL (ref <2.5)
Dihydrocodeine: 13.5 ng/mL — ABNORMAL HIGH (ref <2.5)
Fentanyl: NEGATIVE ng/mL (ref <0.10)
Heroin Metabolite: NEGATIVE ng/mL (ref <1.0)
Hydrocodone: 183.1 ng/mL — ABNORMAL HIGH (ref <2.5)
Hydromorphone: NEGATIVE ng/mL (ref <2.5)
MARIJUANA: NEGATIVE ng/mL (ref <2.5)
MDMA: NEGATIVE ng/mL (ref <10)
Meprobamate: NEGATIVE ng/mL (ref <2.5)
Methadone: NEGATIVE ng/mL (ref <5.0)
Morphine: NEGATIVE ng/mL (ref <2.5)
Nicotine Metabolite: NEGATIVE ng/mL (ref <5.0)
Norhydrocodone: 9.1 ng/mL — ABNORMAL HIGH (ref <2.5)
Noroxycodone: NEGATIVE ng/mL (ref <2.5)
Opiates: POSITIVE ng/mL — AB (ref <2.5)
Oxycodone: NEGATIVE ng/mL (ref <2.5)
Oxymorphone: NEGATIVE ng/mL (ref <2.5)
Phencyclidine: NEGATIVE ng/mL (ref <10)
Tapentadol: NEGATIVE ng/mL (ref <5.0)
Tramadol: NEGATIVE ng/mL (ref <5.0)
Zolpidem: NEGATIVE ng/mL (ref <5.0)

## 2020-12-15 LAB — DRUG TOX ALC METAB W/CON, ORAL FLD: Alcohol Metabolite: NEGATIVE ng/mL (ref <25)

## 2020-12-19 ENCOUNTER — Telehealth: Payer: Self-pay | Admitting: *Deleted

## 2020-12-19 DIAGNOSIS — H1820 Unspecified corneal edema: Secondary | ICD-10-CM | POA: Diagnosis not present

## 2020-12-19 NOTE — Telephone Encounter (Signed)
Oral swab drug screen was consistent for prescribed medications.  ?

## 2020-12-31 ENCOUNTER — Telehealth: Payer: Self-pay | Admitting: *Deleted

## 2020-12-31 NOTE — Telephone Encounter (Signed)
Calling to speak with Zella Ball because she was told to call her.

## 2020-12-31 NOTE — Telephone Encounter (Signed)
Spoke with [employer] : They will need a face to face evaluation and this provider will place a order for Physical Therapist to perform Motorized wheelchair evaluation. Crystal Lee is aware of the above and verbalize understanding.

## 2020-12-31 NOTE — Telephone Encounter (Signed)
Return Crystal Lee call,  She was inquiring about her Scooter from [employer]. [name] physical therapy evaluation was performed on 05/27/2020, note was reviewed.  Placed a call to [employer]

## 2021-01-01 ENCOUNTER — Telehealth: Payer: Self-pay | Admitting: Registered Nurse

## 2021-01-01 DIAGNOSIS — M961 Postlaminectomy syndrome, not elsewhere classified: Secondary | ICD-10-CM

## 2021-01-01 DIAGNOSIS — R202 Paresthesia of skin: Secondary | ICD-10-CM

## 2021-01-01 NOTE — Telephone Encounter (Signed)
Motorized Wheelchair Assessment with Physical Therapy Ordered today.

## 2021-01-05 ENCOUNTER — Telehealth: Payer: Self-pay | Admitting: Family Medicine

## 2021-01-05 ENCOUNTER — Other Ambulatory Visit (INDEPENDENT_AMBULATORY_CARE_PROVIDER_SITE_OTHER): Payer: Medicare Other

## 2021-01-05 ENCOUNTER — Telehealth: Payer: Self-pay

## 2021-01-05 ENCOUNTER — Other Ambulatory Visit: Payer: Self-pay

## 2021-01-05 DIAGNOSIS — R300 Dysuria: Secondary | ICD-10-CM

## 2021-01-05 DIAGNOSIS — R350 Frequency of micturition: Secondary | ICD-10-CM | POA: Diagnosis not present

## 2021-01-05 LAB — POCT URINALYSIS DIP (MANUAL ENTRY)
Bilirubin, UA: NEGATIVE
Glucose, UA: NEGATIVE mg/dL
Ketones, POC UA: NEGATIVE mg/dL
Nitrite, UA: POSITIVE — AB
Spec Grav, UA: 1.015 (ref 1.010–1.025)
Urobilinogen, UA: 0.2 E.U./dL
pH, UA: 6 (ref 5.0–8.0)

## 2021-01-05 MED ORDER — PHENAZOPYRIDINE HCL 100 MG PO TABS: 100.0000 mg | ORAL_TABLET | Freq: Three times a day (TID) | ORAL | 0 refills | Status: DC | PRN

## 2021-01-05 MED ORDER — CEPHALEXIN 500 MG PO CAPS: 500.0000 mg | ORAL_CAPSULE | Freq: Two times a day (BID) | ORAL | 0 refills | Status: DC

## 2021-01-05 NOTE — Telephone Encounter (Signed)
UA is suggestive of UTI Called pt- will rx abs while we await her culture  No fever or vomiting She feels "draggy" and has urinary frequency Sent in meds to her pharmacy  Meds ordered this encounter  Medications   cephALEXin (KEFLEX) 500 MG capsule    Sig: Take 1 capsule (500 mg total) by mouth 2 (two) times daily.    Dispense:  14 capsule    Refill:  0   phenazopyridine (PYRIDIUM) 100 MG tablet    Sig: Take 1 tablet (100 mg total) by mouth 3 (three) times daily as needed for pain.    Dispense:  30 tablet    Refill:  0

## 2021-01-05 NOTE — Telephone Encounter (Signed)
Hey, is ok to put in UA and culture for frequent urination and dysuria.

## 2021-01-05 NOTE — Telephone Encounter (Signed)
LVM to call and get scheduled for UA and culture

## 2021-01-05 NOTE — Telephone Encounter (Signed)
Fraser Din has already come and dropped off this morning

## 2021-01-06 ENCOUNTER — Encounter: Payer: Self-pay | Admitting: Hematology & Oncology

## 2021-01-06 ENCOUNTER — Inpatient Hospital Stay (HOSPITAL_BASED_OUTPATIENT_CLINIC_OR_DEPARTMENT_OTHER): Payer: Medicare Other | Admitting: Hematology & Oncology

## 2021-01-06 ENCOUNTER — Encounter (HOSPITAL_BASED_OUTPATIENT_CLINIC_OR_DEPARTMENT_OTHER): Payer: Medicare Other | Admitting: Registered Nurse

## 2021-01-06 ENCOUNTER — Encounter: Payer: Self-pay | Admitting: *Deleted

## 2021-01-06 ENCOUNTER — Inpatient Hospital Stay: Payer: Medicare Other | Attending: Hematology & Oncology

## 2021-01-06 ENCOUNTER — Other Ambulatory Visit: Payer: Self-pay

## 2021-01-06 ENCOUNTER — Encounter: Payer: Self-pay | Admitting: Registered Nurse

## 2021-01-06 VITALS — BP 138/76 | HR 75 | Temp 97.8°F | Ht 66.0 in | Wt 206.0 lb

## 2021-01-06 VITALS — BP 139/73 | HR 95 | Temp 97.7°F | Resp 20 | Ht 66.0 in | Wt 206.1 lb

## 2021-01-06 DIAGNOSIS — M961 Postlaminectomy syndrome, not elsewhere classified: Secondary | ICD-10-CM | POA: Insufficient documentation

## 2021-01-06 DIAGNOSIS — Z79899 Other long term (current) drug therapy: Secondary | ICD-10-CM | POA: Insufficient documentation

## 2021-01-06 DIAGNOSIS — G8929 Other chronic pain: Secondary | ICD-10-CM | POA: Diagnosis not present

## 2021-01-06 DIAGNOSIS — M7062 Trochanteric bursitis, left hip: Secondary | ICD-10-CM | POA: Insufficient documentation

## 2021-01-06 DIAGNOSIS — M170 Bilateral primary osteoarthritis of knee: Secondary | ICD-10-CM | POA: Insufficient documentation

## 2021-01-06 DIAGNOSIS — M79604 Pain in right leg: Secondary | ICD-10-CM | POA: Diagnosis not present

## 2021-01-06 DIAGNOSIS — S32000S Wedge compression fracture of unspecified lumbar vertebra, sequela: Secondary | ICD-10-CM

## 2021-01-06 DIAGNOSIS — Z85820 Personal history of malignant melanoma of skin: Secondary | ICD-10-CM | POA: Insufficient documentation

## 2021-01-06 DIAGNOSIS — Z853 Personal history of malignant neoplasm of breast: Secondary | ICD-10-CM | POA: Insufficient documentation

## 2021-01-06 DIAGNOSIS — M7061 Trochanteric bursitis, right hip: Secondary | ICD-10-CM

## 2021-01-06 DIAGNOSIS — M4856XA Collapsed vertebra, not elsewhere classified, lumbar region, initial encounter for fracture: Secondary | ICD-10-CM | POA: Insufficient documentation

## 2021-01-06 DIAGNOSIS — R202 Paresthesia of skin: Secondary | ICD-10-CM

## 2021-01-06 DIAGNOSIS — M25552 Pain in left hip: Secondary | ICD-10-CM | POA: Diagnosis not present

## 2021-01-06 DIAGNOSIS — C184 Malignant neoplasm of transverse colon: Secondary | ICD-10-CM | POA: Insufficient documentation

## 2021-01-06 DIAGNOSIS — C186 Malignant neoplasm of descending colon: Secondary | ICD-10-CM | POA: Diagnosis not present

## 2021-01-06 DIAGNOSIS — G894 Chronic pain syndrome: Secondary | ICD-10-CM | POA: Diagnosis not present

## 2021-01-06 DIAGNOSIS — M25562 Pain in left knee: Secondary | ICD-10-CM | POA: Diagnosis not present

## 2021-01-06 DIAGNOSIS — M25561 Pain in right knee: Secondary | ICD-10-CM | POA: Insufficient documentation

## 2021-01-06 DIAGNOSIS — M79605 Pain in left leg: Secondary | ICD-10-CM | POA: Diagnosis not present

## 2021-01-06 DIAGNOSIS — M545 Low back pain, unspecified: Secondary | ICD-10-CM | POA: Insufficient documentation

## 2021-01-06 DIAGNOSIS — Z5181 Encounter for therapeutic drug level monitoring: Secondary | ICD-10-CM | POA: Insufficient documentation

## 2021-01-06 DIAGNOSIS — M25551 Pain in right hip: Secondary | ICD-10-CM | POA: Diagnosis not present

## 2021-01-06 DIAGNOSIS — M5416 Radiculopathy, lumbar region: Secondary | ICD-10-CM | POA: Insufficient documentation

## 2021-01-06 DIAGNOSIS — M62838 Other muscle spasm: Secondary | ICD-10-CM | POA: Diagnosis not present

## 2021-01-06 LAB — CBC WITH DIFFERENTIAL (CANCER CENTER ONLY)
Abs Immature Granulocytes: 0.02 10*3/uL (ref 0.00–0.07)
Basophils Absolute: 0 10*3/uL (ref 0.0–0.1)
Basophils Relative: 0 %
Eosinophils Absolute: 0.5 10*3/uL (ref 0.0–0.5)
Eosinophils Relative: 7 %
HCT: 42.4 % (ref 36.0–46.0)
Hemoglobin: 13.7 g/dL (ref 12.0–15.0)
Immature Granulocytes: 0 %
Lymphocytes Relative: 29 %
Lymphs Abs: 2 10*3/uL (ref 0.7–4.0)
MCH: 32.1 pg (ref 26.0–34.0)
MCHC: 32.3 g/dL (ref 30.0–36.0)
MCV: 99.3 fL (ref 80.0–100.0)
Monocytes Absolute: 0.5 10*3/uL (ref 0.1–1.0)
Monocytes Relative: 7 %
Neutro Abs: 3.9 10*3/uL (ref 1.7–7.7)
Neutrophils Relative %: 57 %
Platelet Count: 127 10*3/uL — ABNORMAL LOW (ref 150–400)
RBC: 4.27 MIL/uL (ref 3.87–5.11)
RDW: 14.6 % (ref 11.5–15.5)
WBC Count: 6.9 10*3/uL (ref 4.0–10.5)
nRBC: 0 % (ref 0.0–0.2)

## 2021-01-06 LAB — CMP (CANCER CENTER ONLY)
ALT: 25 U/L (ref 0–44)
AST: 24 U/L (ref 15–41)
Albumin: 4 g/dL (ref 3.5–5.0)
Alkaline Phosphatase: 160 U/L — ABNORMAL HIGH (ref 38–126)
Anion gap: 7 (ref 5–15)
BUN: 9 mg/dL (ref 8–23)
CO2: 30 mmol/L (ref 22–32)
Calcium: 10 mg/dL (ref 8.9–10.3)
Chloride: 102 mmol/L (ref 98–111)
Creatinine: 0.66 mg/dL (ref 0.44–1.00)
GFR, Estimated: >60 mL/min (ref >60)
Glucose, Bld: 102 mg/dL — ABNORMAL HIGH (ref 70–99)
Potassium: 4.4 mmol/L (ref 3.5–5.1)
Sodium: 139 mmol/L (ref 135–145)
Total Bilirubin: 0.6 mg/dL (ref 0.3–1.2)
Total Protein: 7.2 g/dL (ref 6.5–8.1)

## 2021-01-06 LAB — LACTATE DEHYDROGENASE: LDH: 167 U/L (ref 98–192)

## 2021-01-06 MED ORDER — HYDROCODONE-ACETAMINOPHEN 10-325 MG PO TABS: 1.0000 | ORAL_TABLET | Freq: Four times a day (QID) | ORAL | 0 refills | Status: DC | PRN

## 2021-01-06 NOTE — Progress Notes (Signed)
Hematology and Oncology Follow Up Visit  Crystal Lee [id number] 29-Mar-1934 87 y.o. 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 Year -- next dose on 11/2021   Interim History:  Crystal Lee is here today for follow-up.  Crystal Lee has completed all of Crystal Lee chemotherapy in the adjuvant setting for colon cancer.  Crystal Lee had stage III colon cancer.  Crystal Lee had adjuvant Xeloda.  Crystal Lee did well with this.  Crystal Lee was having some problems with diarrhea.  Crystal Lee found that drinking goat's milk really helped Crystal Lee.  Crystal Lee has had no problems with cough or shortness of breath.  There has been no bleeding.  Crystal Lee has had no leg swelling.  There has been no rashes.  Crystal Lee last CEA level back in early July was 2.03.  Crystal Lee has had no fever.  There has been no bleeding.  Crystal Lee has had no obvious change in bowel or bladder habits.  Overall, Crystal Lee performance status is [name].  Medications:  Allergies as of 01/06/2021       Reactions   Darvon [propoxyphene] Nausea And Vomiting, Palpitations   Darvocet Causes Sweats   Adhesive [tape] Rash   Morphine And Related Nausea And Vomiting        Medication List        Accurate as of January 06, 2021 12:00 PM. If you have any questions, ask your nurse or doctor.          capecitabine 500 MG tablet Commonly known as: XELODA TAKE 4 TABLETS BY MOUTH 2 TIMES DAILY AFTER A MEAL. TAKE FOR 14 DAYS, THEN HOLD FOR 7 DAYS. REPEAT EVERY 21 DAYS.   cephALEXin 500 MG capsule Commonly known as: KEFLEX Take 1 capsule (500 mg total) by mouth 2 (two) times daily.   cyclobenzaprine 5 MG tablet Commonly known as: FLEXERIL Take 1 tablet (5 mg total) by  mouth at bedtime as needed for muscle spasms.   diphenoxylate-atropine 2.5-0.025 MG tablet Commonly known as: LOMOTIL Take 1 tablet by mouth 4 (four) times daily as needed for diarrhea or loose stools.   estradiol 0.1 MG/GM vaginal cream Commonly known as: ESTRACE VAGINAL Apply 1gm vaginally 1-3x a week as needed for comfort   fenofibrate 48 MG tablet Commonly known as: Tricor Take 1 tablet (48 mg total) by mouth daily.   HYDROcodone-acetaminophen 10-325 MG tablet Commonly known as: NORCO Take 1 tablet by mouth every 6 (six) hours as needed.   ipratropium 0.03 % nasal spray Commonly known as: ATROVENT Place 2 sprays into both nostrils 4 (four) times daily as needed for rhinitis.   loperamide 2 MG tablet Commonly known as: IMODIUM A-D Take 2 mg by mouth 4 (four) times daily as needed for diarrhea or loose stools.   losartan 25 MG tablet Commonly known as: COZAAR Take 1 tablet (25 mg total) by mouth daily.   Multivitamin Adults Tabs Take 1 tablet by mouth daily.   naloxegol oxalate 12.5 MG Tabs tablet Commonly known as: MOVANTIK Take 1 tablet (12.5 mg total) by mouth daily.   neomycin-polymyxin b-dexamethasone 3.5-10000-0.1 Susp Commonly known as: MAXITROL Place 1 drop into the left eye daily as  needed.   phenazopyridine 100 MG tablet Commonly known as: Pyridium Take 1 tablet (100 mg total) by mouth 3 (three) times daily as needed for pain.   promethazine 12.5 MG tablet Commonly known as: PHENERGAN Take 1 tablet (12.5 mg total) by mouth every 6 (six) hours as needed for nausea or vomiting.   valACYclovir 1000 MG tablet Commonly known as: VALTREX Take 1,000 mg by mouth daily.        Allergies:  Allergies  Allergen Reactions   Darvon [Propoxyphene] Nausea And Vomiting and Palpitations    Darvocet Causes Sweats   Adhesive [Tape] Rash   Morphine And Related Nausea And Vomiting    Past Medical History, Surgical history, Social history, and Family History  were reviewed and updated.  Review of Systems: Review of Systems  Constitutional: Negative.   HENT: Negative.    Eyes: Negative.   Respiratory: Negative.    Cardiovascular: Negative.   Gastrointestinal: Negative.   Genitourinary: Negative.   Musculoskeletal: Negative.   Skin: Negative.   Neurological: Negative.   Endo/Heme/Allergies: Negative.   Psychiatric/Behavioral: Negative.      Physical Exam:  height is $RemoveB'5\' 6"'VcIYkwUQ$  (1.676 m) and weight is 206 lb 1.9 oz (93.5 kg). Crystal Lee oral temperature is 97.7 F (36.5 C). Crystal Lee blood pressure is 139/73 and Crystal Lee pulse is 95. Crystal Lee respiration is 20 and oxygen saturation is 97%.   Wt Readings from Last 3 Encounters:  01/06/21 206 lb 1.9 oz (93.5 kg)  12/10/20 207 lb 1.9 oz (93.9 kg)  12/09/20 207 lb 3.2 oz (94 kg)    Physical Exam Vitals reviewed.  HENT:     Head: Normocephalic and atraumatic.  Eyes:     Pupils: Pupils are equal, round, and reactive to light.  Cardiovascular:     Rate and Rhythm: Normal rate and regular rhythm.     Heart sounds: Normal heart sounds.  Pulmonary:     Effort: Pulmonary effort is normal.     Breath sounds: Normal breath sounds.  Abdominal:     General: Bowel sounds are normal.     Palpations: Abdomen is soft.  Musculoskeletal:        General: No tenderness or deformity. Normal range of motion.     Cervical back: Normal range of motion.  Lymphadenopathy:     Cervical: No cervical adenopathy.  Skin:    General: Skin is warm and dry.     Findings: No erythema or rash.  Neurological:     Mental Status: Crystal Lee is alert and oriented to person, place, and time.  Psychiatric:        Behavior: Behavior normal.        Thought Content: Thought content normal.        Judgment: Judgment normal.    Lab Results  Component Value Date   WBC 6.9 01/06/2021   HGB 13.7 01/06/2021   HCT 42.4 01/06/2021   MCV 99.3 01/06/2021   PLT 127 (L) 01/06/2021   Lab Results  Component Value Date   FERRITIN 45 06/09/2020   IRON  49 06/09/2020   TIBC 320 06/09/2020   UIBC 271 06/09/2020   IRONPCTSAT 15 06/09/2020   Lab Results  Component Value Date   RETICCTPCT 1.6 06/09/2020   RBC 4.27 01/06/2021   No results found for: KPAFRELGTCHN, LAMBDASER, KAPLAMBRATIO Lab Results  Component Value Date   IGGSERUM 1,085 02/09/2017   IGMSERUM 336 (H) 02/09/2017   No results found for: TOTALPROTELP, ALBUMINELP, A1GS, A2GS, BETS, BETA2SER, [place], MSPIKE, SPEI  Chemistry      Component Value Date/Time   NA 139 01/06/2021 1135   NA 139 02/25/2020 1627   K 4.4 01/06/2021 1135   CL 102 01/06/2021 1135   CO2 30 01/06/2021 1135   BUN 9 01/06/2021 1135   BUN 11 02/25/2020 1627   CREATININE 0.66 01/06/2021 1135      Component Value Date/Time   CALCIUM 10.0 01/06/2021 1135   ALKPHOS 160 (H) 01/06/2021 1135   AST 24 01/06/2021 1135   ALT 25 01/06/2021 1135   BILITOT 0.6 01/06/2021 1135       Impression and Plan:  Crystal Lee is a very pleasant 87 yo caucasian female with history of bilateral stage Ia breast cancer diagnosed back in 1998. Crystal Lee now has locally advanced colon cancer stage IIIb with 3+ lymph nodes.  Crystal Lee will now go on a surveillance phase.  We will set Crystal Lee up with a CT scan in September.  We will do a CT scan every 4 months for the first year.   Even with the adjuvant Xeloda, there is still at risk of recurrence.  Again, Crystal Lee really did a good job with the Xeloda.  Very happy for Crystal Lee.  Crystal Lee showed a lot of toughness.  I will plan to have Crystal Lee come back in about 2 months.  Again we will get the CT scan in 6 weeks.   [name], MD 8/9/202212:00 PM

## 2021-01-06 NOTE — Progress Notes (Signed)
Patient needs a CT scan scheduled prior to her follow up appointment. Scheduled for 02/26/21.  Called patient and notified her of her appointment, including date, time and location. Reviewed the prep for CT CAP and informed her that she would need to pick up her oral contrast prior to her CT appointment. Also sent radiology information sheet with all the above information for education reinforcement.   Oncology Nurse Navigator Documentation  Oncology Nurse Navigator Flowsheets 01/06/2021  Confirmed Diagnosis Date -  Diagnosis Status -  Planned Course of Treatment -  Phase of Treatment -  Chemotherapy Pending- Reason: -  Chemotherapy Actual Start Date: -  Navigator Follow Up Date: 02/26/2021  Navigator Follow Up Reason: Scan Review  Navigator Location [facility]  Navigator Encounter Type Appt/Treatment Plan Review;Telephone  Telephone Appt Confirmation/Clarification;Education;Outgoing Call  Treatment Initiated Date -  Patient Visit Type MedOnc  Treatment Phase Active Tx  Barriers/Navigation Needs Coordination of Care;Education;Anxiety;Transportation  Education Other  Interventions Coordination of Care;Education;Psycho-Social Support  Acuity Level 2-Minimal Needs (1-2 Barriers Identified)  Coordination of Care Radiology  Education Method Verbal;Written  Time Spent with Patient 30

## 2021-01-06 NOTE — Progress Notes (Addendum)
Subjective:    Patient ID: Crystal Lee, female    DOB: [birth year], 87 y.o.   MRN: [id number]  HPI: Crystal Lee is a 87 y.o. female who is here for a Face to Face Power Wheelchair evaluation, I have placed a referral for Physical therapy to performed a wheelchair evaluation. Ms. Brando verbalizes understanding. The Power Wheelchair will be a great asset for Ms. Tsou. This will help her with mobility in her home, also improving her independence with activities of daily living, such as cooking, cleaning and laundry. 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  She states her pain is located in her lower back, bilateral lower extremities, bilateral hip and bilateral knee pain. She rates her pain 5. Her current exercise regime is walking and performing stretching exercises.  Ms. Swaggerty Morphine equivalent is 40.00  MME.   Last Oral Swab was Performed on 12/09/2020, it was consistent.    Pain Inventory Average Pain 5 Pain Right Now 5 My pain is sharp, burning, and aching  In the last 24 hours, has pain interfered with the following? General activity 5 Relation with others 3 Enjoyment of life 5 What TIME of day is your pain at its worst? evening and night Sleep (in general) Fair  Pain is worse with: walking, bending, standing, and some activites Pain improves with: rest, heat/ice, and medication Relief from Meds: 4  Family History  Problem Relation Age of Onset   Other Mother        complications from flu   Other Father        unsure of cause   Colon cancer Neg Hx    Social History   Socioeconomic History   Marital status: Widowed    Spouse name: Not on file   Number of children: 3   Years of education: 16 years    Highest education level: Not on file  Occupational History   Occupation: Retired  Tobacco Use   Smoking status: Former    Packs/day: 0.50    Years: 20.00    Pack years: 10.00    Types: Cigarettes    Quit date: 02/03/1976    Years since quitting: 44.9   Smokeless tobacco: Never  Vaping Use   Vaping Use: Never used  Substance and Sexual Activity   Alcohol use: No   Drug use: No   Sexual activity: Yes    Birth control/protection: Post-menopausal  Other Topics Concern   Not on file  Social History Narrative   Lives at home with husband.   Right-handed.   No caffeine use.   Social Determinants of Health   Financial Resource Strain: Not on file  Food Insecurity: Not on file  Transportation Needs: Not on file  Physical Activity: Not on file  Stress: Not on file  Social Connections: Not on file   Past Surgical History:  Procedure Laterality Date   69 HOUR [place] STUDY N/A 06/21/2018   Procedure: 24 HOUR [place];  Surgeon: Lavena Bullion, DO;  [facility]: [facility];  Service: Gastroenterology;  Laterality: N/A;  with impedance   ABDOMINAL HYSTERECTOMY  2010   ANAL RECTAL MANOMETRY N/A 09/19/2019   Procedure: ANO RECTAL MANOMETRY;  Surgeon: Mauri Pole, MD;  [facility]: [facility];  Service: Endoscopy;  Laterality: N/A;   APPENDECTOMY  1949  [place]   BIOPSY  06/10/2020   Procedure: BIOPSY;  Surgeon: Lavena Bullion, DO;  [facility];  Service: Gastroenterology;;  EGD and COLON   BREAST SURGERY     CATARACT EXTRACTION Bilateral 2011   CHOLECYSTECTOMY     COLON RESECTION N/A 06/12/2020   Procedure: LAPAROSCOPIC COLON RESECTION;  Surgeon: [name], MD;  [facility]: [facility];  Service: General;  Laterality: N/A;   COLONOSCOPY WITH PROPOFOL N/A 06/10/2020   Procedure: COLONOSCOPY WITH PROPOFOL;  Surgeon: Lavena Bullion, DO;  [facility];  Service: Gastroenterology;  Laterality: N/A;   ESOPHAGEAL MANOMETRY N/A 06/21/2018    Procedure: ESOPHAGEAL MANOMETRY (EM);  Surgeon: Lavena Bullion, DO;  [facility];  Service: Gastroenterology;  Laterality: N/A;   ESOPHAGOGASTRODUODENOSCOPY (EGD) WITH PROPOFOL N/A 06/10/2020   Procedure: ESOPHAGOGASTRODUODENOSCOPY (EGD) WITH PROPOFOL;  Surgeon: Lavena Bullion, DO;  [facility];  Service: Gastroenterology;  Laterality: N/A;   GALLBLADDER SURGERY  2015   JOINT REPLACEMENT     RT TOTAL HIP / RT TOTAL KNEE   MASTECTOMY  1998   BILATERAL    [place] IMPEDANCE STUDY  06/21/2018   Procedure: [place] IMPEDANCE STUDY;  Surgeon: Lavena Bullion, DO;  [facility];  Service: Gastroenterology;;   POLYPECTOMY  06/10/2020   Procedure: POLYPECTOMY;  Surgeon: Lavena Bullion, DO;  [facility];  Service: Gastroenterology;;   SKIN CANCER EXCISION  2016   RT SIDE OF NOSE   SUBMUCOSAL TATTOO INJECTION  06/10/2020   Procedure: SUBMUCOSAL TATTOO INJECTION;  Surgeon: Lavena Bullion, DO;  [facility];  Service: Gastroenterology;;   [place]  2010   RIGHT   TOTAL HIP ARTHROPLASTY Left 05/09/2015   Procedure: LEFT TOTAL HIP ARTHROPLASTY ANTERIOR APPROACH;  Surgeon: Gaynelle Arabian, MD;  [facility];  Service: Orthopedics;  Laterality: Left;   TOTAL KNEE ARTHROPLASTY  2001   TUMOR REMOVAL  2012   ABDOMINAL - NON CANCEROUS   Past Surgical History:  Procedure Laterality Date   36 HOUR [place] STUDY N/A 06/21/2018   Procedure: 24 HOUR [place] STUDY;  Surgeon: Lavena Bullion, DO;  [facility];  Service: Gastroenterology;  Laterality: N/A;  with impedance   ABDOMINAL HYSTERECTOMY  2010   ANAL RECTAL MANOMETRY N/A 09/19/2019   Procedure: ANO RECTAL MANOMETRY;  Surgeon: Mauri Pole, MD;  [facility];  Service: Endoscopy;  Laterality: N/A;   [place]   BIOPSY  06/10/2020   Procedure: BIOPSY;  Surgeon: Lavena Bullion, DO;  [facility];   Service: Gastroenterology;;  EGD and COLON   BREAST SURGERY     CATARACT EXTRACTION Bilateral 2011   CHOLECYSTECTOMY     COLON RESECTION N/A 06/12/2020   Procedure: LAPAROSCOPIC COLON RESECTION;  Surgeon: [name], MD;  [facility];  Service: General;  Laterality: N/A;   COLONOSCOPY WITH PROPOFOL N/A 06/10/2020   Procedure: COLONOSCOPY WITH PROPOFOL;  Surgeon: Lavena Bullion, DO;  [facility];  Service: Gastroenterology;  Laterality: N/A;   ESOPHAGEAL MANOMETRY N/A 06/21/2018   Procedure: ESOPHAGEAL MANOMETRY (EM);  Surgeon: Lavena Bullion, DO;  [facility];  Service: Gastroenterology;  Laterality: N/A;   ESOPHAGOGASTRODUODENOSCOPY (EGD) WITH PROPOFOL N/A 06/10/2020   Procedure: ESOPHAGOGASTRODUODENOSCOPY (EGD) WITH PROPOFOL;  Surgeon: Lavena Bullion, DO;  [facility];  Service: Gastroenterology;  Laterality: N/A;   GALLBLADDER SURGERY  2015  JOINT REPLACEMENT     RT TOTAL HIP / RT TOTAL KNEE   MASTECTOMY  1998   BILATERAL    [place] IMPEDANCE STUDY  06/21/2018   Procedure: [place] IMPEDANCE STUDY;  Surgeon: Lavena Bullion, DO;  [facility];  Service: Gastroenterology;;   POLYPECTOMY  06/10/2020   Procedure: POLYPECTOMY;  Surgeon: Lavena Bullion, DO;  [facility];  Service: Gastroenterology;;   SKIN CANCER EXCISION  2016   RT SIDE OF NOSE   SUBMUCOSAL TATTOO INJECTION  06/10/2020   Procedure: SUBMUCOSAL TATTOO INJECTION;  Surgeon: Lavena Bullion, DO;  [facility];  Service: Gastroenterology;;   [place]  2010   RIGHT   TOTAL HIP ARTHROPLASTY Left 05/09/2015   Procedure: LEFT TOTAL HIP ARTHROPLASTY ANTERIOR APPROACH;  Surgeon: Gaynelle Arabian, MD;  [facility];  Service: Orthopedics;  Laterality: Left;   TOTAL KNEE ARTHROPLASTY  2001   TUMOR REMOVAL  2012   ABDOMINAL - NON CANCEROUS   Past Medical History:  Diagnosis Date   Arthritis    Cancer ([place])    HX  BREAST CANCER/ SKIN CANCER   Complication of anesthesia    N/V WITH MORPHINE   Difficulty sleeping    Fractured hip ([place])    LEFT - AUG 2016   GERD (gastroesophageal reflux disease)    Hyperlipidemia    Hypertension    Melanoma ([place])    Neuropathy    Nocturia    Osteopenia    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 2/9 12/09/2020 09/22/2020 08/11/2020 05/20/2020 12/24/2019 11/22/2018 09/28/2018  Decreased Interest 0 0 0 0 0 0 0  Down, Depressed, Hopeless 0 0 0 0 0 0 0  PHQ - 2 Score 0 0 0 0 0 0 0  Altered sleeping - - - - - - -  Tired, decreased energy - - - - - - -  Change in appetite - - - - - - -  Feeling bad or failure about yourself  - - - - - - -  Trouble concentrating - - - - - - -  Moving slowly or fidgety/restless - - - - - - -  Suicidal thoughts - - - - - - -  PHQ-9 Score - - - - - - -  Difficult doing work/chores - - - - - - -  Some recent data might be hidden       Review of Systems  Constitutional: Negative.   HENT: Negative.    Eyes: Negative.   Respiratory: Negative.    Cardiovascular: Negative.   Gastrointestinal: Negative.   Endocrine: Negative.   Genitourinary: Negative.   Musculoskeletal:  Positive for back pain and gait problem.       Pain in both legs  Skin: Negative.   Allergic/Immunologic: Negative.   Hematological: Negative.   Psychiatric/Behavioral: Negative.        Objective:   Physical Exam Vitals  and nursing note reviewed.  Constitutional:      Appearance: Normal appearance.  Cardiovascular:     Rate and Rhythm: Normal rate and regular rhythm.      Pulses: Normal pulses.     Heart sounds: Normal heart sounds.  Pulmonary:     Effort: Pulmonary effort is normal.     Breath sounds: Normal breath sounds.  Musculoskeletal:     Cervical back: Normal range of motion and neck supple.     Comments: Normal Muscle Bulk and Muscle Testing Reveals: Upper Extremities: ROM and Muscle Strength Thoracic, Lumbar Lower Extremities Gait     Skin:    General: Skin is warm and dry.  Neurological:     Mental Status: She is alert and oriented to person, place, and time.  Psychiatric:        Mood and Affect: Mood normal.        Behavior: Behavior normal.          Assessment & Plan:  1. Chronic Bilateral Leg pain: Paresthesia: Continue to Monitor. 01/06/2021 2. Paresthesia Doreene Burke Radiculitis: Ms. Vannatter has weaned herself off the Lyrica due to daytime drowsiness. We will continue to monitor. 01/06/2021 3. Pain of Left Wrist/ : No complaints today. S/P Carpal Tunnel Release on 06/03/2017 by Dr. Ellene Route. Dr. Ellene Route Following. 01/06/2021. 4. Fracture of superior pubic ramus.  Dr. Wynelle Link Following.  Continue to monitor. 01/06/2021. 5. Bilateral Knee OA: Continue Voltaren Gel.  Continue to monitor. Orthopedist following. 01/06/2021 6. Polyarthralgia: Continue to alternate with heat and ice therapy. Continue current medication regime. Continue to monitor. 01/06/2021. 7. Chronic Pain Syndrome: Refilled::Hydrocodone 10/325 mg one tablet every 6 hours as needed for moderate pain #120. 01/06/2021. 8. Lumbar Compression Fracture L2 and L3:  Ms. Vesper refused physical therapy. Continue with rest/ heat therapy. Continue to Monitor 01/06/2021. 9. Muscle Spasm: Continue  Flexeril 5 mg at HS. Continue to monitor. 01/06/2021. 10. Right Shoulder Tendonitis: No complaints today. Continue HEP as Tolerated. Alternate Ice and Heat Therapy.    F/U in 1 Month

## 2021-01-07 LAB — URINE CULTURE
MICRO NUMBER:: 12213446
SPECIMEN QUALITY:: ADEQUATE

## 2021-01-07 LAB — CEA (IN HOUSE-CHCC): CEA (CHCC-In House): 2.03 ng/mL (ref 0.00–5.00)

## 2021-01-07 MED ORDER — SULFAMETHOXAZOLE-TRIMETHOPRIM 800-160 MG PO TABS: 1.0000 | ORAL_TABLET | Freq: Two times a day (BID) | ORAL | 0 refills | Status: DC

## 2021-01-07 MED ORDER — PHENAZOPYRIDINE HCL 200 MG PO TABS
200.0000 mg | ORAL_TABLET | Freq: Three times a day (TID) | ORAL | 0 refills | Status: DC | PRN
Start: 2021-01-07 — End: 2021-03-27

## 2021-01-07 NOTE — Telephone Encounter (Signed)
Received urine culture- resistant to keflex Called pt and let her know, change to septra She also requests more pyridium She will let me know if not working  Results for orders placed or performed in visit on 01/05/21  Urine Culture   Specimen: Urine  Result Value Ref Range   MICRO NUMBER: [id number]    SPECIMEN QUALITY: Adequate    Sample Source NOT GIVEN    STATUS: FINAL    ISOLATE 1: Klebsiella oxytoca (A)       Susceptibility   Klebsiella oxytoca - URINE CULTURE, REFLEX    AMOX/CLAVULANIC 4 Sensitive     AMPICILLIN >=32 Resistant     AMPICILLIN/SULBACTAM 8 Sensitive     CEFAZOLIN* 8 Resistant      * For uncomplicated UTI caused by E. coli, K. pneumoniae or P. mirabilis: Cefazolin is susceptible if MIC <32 mcg/mL and predicts susceptible to the oral agents cefaclor, cefdinir, cefpodoxime, cefprozil, cefuroxime, cephalexin and loracarbef.     CEFTAZIDIME <=1 Sensitive     CEFEPIME <=1 Sensitive     CEFTRIAXONE <=1 Sensitive     CIPROFLOXACIN <=0.25 Sensitive     LEVOFLOXACIN <=0.12 Sensitive     GENTAMICIN <=1 Sensitive     IMIPENEM <=0.25 Sensitive     NITROFURANTOIN 64 Intermediate     PIP/TAZO <=4 Sensitive     TOBRAMYCIN <=1 Sensitive     TRIMETH/SULFA* <=20 Sensitive      * For uncomplicated UTI caused by E. coli, K. pneumoniae or P. mirabilis: Cefazolin is susceptible if MIC <32 mcg/mL and predicts susceptible to the oral agents cefaclor, cefdinir, cefpodoxime, cefprozil, cefuroxime, cephalexin and loracarbef. Legend: S = Susceptible  I = Intermediate R = Resistant  NS = Not susceptible * = Not tested  NR = Not reported **NN = See antimicrobic comments   POCT urinalysis dipstick  Result Value Ref Range   Color, UA yellow yellow   Clarity, UA cloudy (A) clear   Glucose, UA negative negative mg/dL   Bilirubin, UA negative negative   Ketones, POC UA negative negative mg/dL   Spec Grav, UA 1.015 1.010 - 1.025   Blood, UA small (A) negative   pH, UA 6.0 5.0  - 8.0   Protein Ur, POC trace (A) negative mg/dL   Urobilinogen, UA 0.2 0.2 or 1.0 E.U./dL   Nitrite, UA Positive (A) Negative   Leukocytes, UA Large (3+) (A) Negative

## 2021-01-07 NOTE — Addendum Note (Signed)
Addended by: Lamar Blinks C on: 01/07/2021 02:53 PM   Modules accepted: Orders

## 2021-01-15 ENCOUNTER — Other Ambulatory Visit: Payer: Medicare Other

## 2021-01-15 ENCOUNTER — Telehealth: Payer: Self-pay | Admitting: Family Medicine

## 2021-01-15 ENCOUNTER — Other Ambulatory Visit: Payer: Self-pay

## 2021-01-15 DIAGNOSIS — R300 Dysuria: Secondary | ICD-10-CM | POA: Diagnosis not present

## 2021-01-15 MED ORDER — SULFAMETHOXAZOLE-TRIMETHOPRIM 800-160 MG PO TABS: 1.0000 | ORAL_TABLET | Freq: Two times a day (BID) | ORAL | 0 refills | Status: DC

## 2021-01-15 NOTE — Addendum Note (Signed)
Addended by: Jeronimo Greaves on: 01/15/2021 03:46 PM   Modules accepted: Orders

## 2021-01-15 NOTE — Telephone Encounter (Signed)
Ok- please give her a call back I sent in another rx for a week of septra to her pharmacy in [place]. Ideally would like to re-culture her urine prior to staring antibiotics again.  I gave her 5 days of septra last time which should be enough to treat the infection- it is possible this is a different bacteria.   If she is able to being in a urine sample for [place] to culture that would be great, if not we can start her on treatment.  I don't want to delay treatment in order to obtain culture   Thank you!

## 2021-01-15 NOTE — Telephone Encounter (Signed)
Spoke with patient. She will come in this afternoon to leave urine sample prior to starting treatment.

## 2021-01-15 NOTE — Telephone Encounter (Signed)
Patient states her UTI sxs has returned. She states the 2nd antibiotics that she was given the worked but she only had a few pills. She is requesting that she get more of those pills sent in.

## 2021-01-15 NOTE — Telephone Encounter (Signed)
Patient given bactrim 800-160 mg on 01/07/2021

## 2021-01-18 LAB — URINE CULTURE
MICRO NUMBER:: 12260806
SPECIMEN QUALITY:: ADEQUATE

## 2021-01-18 MED ORDER — AMOXICILLIN-POT CLAVULANATE 875-125 MG PO TABS: 1.0000 | ORAL_TABLET | Freq: Two times a day (BID) | ORAL | 0 refills | Status: DC

## 2021-01-18 NOTE — Addendum Note (Signed)
Addended by: Lamar Blinks C on: 01/18/2021 10:41 AM   Modules accepted: Orders

## 2021-01-18 NOTE — Progress Notes (Signed)
Received her urine culture as below  Results for orders placed or performed in visit on 01/15/21  Urine Culture   Specimen: Urine  Result Value Ref Range   MICRO NUMBER: [id number]    SPECIMEN QUALITY: Adequate    Sample Source URINE    STATUS: FINAL    ISOLATE 1: Escherichia coli (A)       Susceptibility   Escherichia coli - URINE CULTURE, REFLEX    AMOX/CLAVULANIC 8 Sensitive     AMPICILLIN >=32 Resistant     AMPICILLIN/SULBACTAM 16 Intermediate     CEFAZOLIN* <=4 Not Reportable      * For infections other than uncomplicated UTI caused by E. coli, K. pneumoniae or P. mirabilis: Cefazolin is resistant if MIC > or = 8 mcg/mL. (Distinguishing susceptible versus intermediate for isolates with MIC < or = 4 mcg/mL requires additional testing.) For uncomplicated UTI caused by E. coli, K. pneumoniae or P. mirabilis: Cefazolin is susceptible if MIC <32 mcg/mL and predicts susceptible to the oral agents cefaclor, cefdinir, cefpodoxime, cefprozil, cefuroxime, cephalexin and loracarbef.     CEFTAZIDIME <=1 Sensitive     CEFEPIME <=1 Sensitive     CEFTRIAXONE <=1 Sensitive     CIPROFLOXACIN 2 Resistant     LEVOFLOXACIN 4 Resistant     GENTAMICIN <=1 Sensitive     IMIPENEM <=0.25 Sensitive     NITROFURANTOIN <=16 Sensitive     PIP/TAZO <=4 Sensitive     TOBRAMYCIN <=1 Sensitive     TRIMETH/SULFA* >=320 Resistant      * For infections other than uncomplicated UTI caused by E. coli, K. pneumoniae or P. mirabilis: Cefazolin is resistant if MIC > or = 8 mcg/mL. (Distinguishing susceptible versus intermediate for isolates with MIC < or = 4 mcg/mL requires additional testing.) For uncomplicated UTI caused by E. coli, K. pneumoniae or P. mirabilis: Cefazolin is susceptible if MIC <32 mcg/mL and predicts susceptible to the oral agents cefaclor, cefdinir, cefpodoxime, cefprozil, cefuroxime, cephalexin and loracarbef. Legend: S = Susceptible  I = Intermediate R = Resistant  NS = Not  susceptible * = Not tested  NR = Not reported **NN = See antimicrobic comments    Urine culture 10 days prior grew klebsiella sensitive to septra - I refilled this med when she called in with recurrent sx on 8/18 while culture pending  New bacteria is resistant Called her- she has felt hot and her back hurts, has not checked her temp however She can use PCN- will treat with Augmentin  She can use [facility]- they open at [address] to start on abx asap, get 2 doses today However if she is not feeling well - weak, not responding rapidly to abx- go to ER.  She states understanding and agreement

## 2021-01-28 DIAGNOSIS — D485 Neoplasm of uncertain behavior of skin: Secondary | ICD-10-CM | POA: Diagnosis not present

## 2021-01-28 DIAGNOSIS — D225 Melanocytic nevi of trunk: Secondary | ICD-10-CM | POA: Diagnosis not present

## 2021-01-28 DIAGNOSIS — L905 Scar conditions and fibrosis of skin: Secondary | ICD-10-CM | POA: Diagnosis not present

## 2021-01-28 DIAGNOSIS — L304 Erythema intertrigo: Secondary | ICD-10-CM | POA: Diagnosis not present

## 2021-01-28 DIAGNOSIS — L821 Other seborrheic keratosis: Secondary | ICD-10-CM | POA: Diagnosis not present

## 2021-01-28 DIAGNOSIS — Z85828 Personal history of other malignant neoplasm of skin: Secondary | ICD-10-CM | POA: Diagnosis not present

## 2021-02-04 ENCOUNTER — Encounter: Payer: Medicare Other | Attending: Physical Medicine & Rehabilitation | Admitting: Registered Nurse

## 2021-02-04 ENCOUNTER — Other Ambulatory Visit: Payer: Self-pay

## 2021-02-04 ENCOUNTER — Encounter: Payer: Self-pay | Admitting: Registered Nurse

## 2021-02-04 VITALS — BP 147/74 | HR 75 | Temp 98.2°F | Ht 66.0 in | Wt 206.2 lb

## 2021-02-04 DIAGNOSIS — M2550 Pain in unspecified joint: Secondary | ICD-10-CM

## 2021-02-04 DIAGNOSIS — R202 Paresthesia of skin: Secondary | ICD-10-CM | POA: Diagnosis not present

## 2021-02-04 DIAGNOSIS — S32000S Wedge compression fracture of unspecified lumbar vertebra, sequela: Secondary | ICD-10-CM | POA: Diagnosis not present

## 2021-02-04 DIAGNOSIS — M961 Postlaminectomy syndrome, not elsewhere classified: Secondary | ICD-10-CM

## 2021-02-04 DIAGNOSIS — Z5181 Encounter for therapeutic drug level monitoring: Secondary | ICD-10-CM

## 2021-02-04 DIAGNOSIS — M7061 Trochanteric bursitis, right hip: Secondary | ICD-10-CM | POA: Diagnosis not present

## 2021-02-04 DIAGNOSIS — M170 Bilateral primary osteoarthritis of knee: Secondary | ICD-10-CM | POA: Diagnosis not present

## 2021-02-04 DIAGNOSIS — G894 Chronic pain syndrome: Secondary | ICD-10-CM

## 2021-02-04 DIAGNOSIS — M7062 Trochanteric bursitis, left hip: Secondary | ICD-10-CM | POA: Insufficient documentation

## 2021-02-04 DIAGNOSIS — Z79899 Other long term (current) drug therapy: Secondary | ICD-10-CM | POA: Diagnosis not present

## 2021-02-04 DIAGNOSIS — M62838 Other muscle spasm: Secondary | ICD-10-CM | POA: Diagnosis not present

## 2021-02-04 MED ORDER — HYDROCODONE-ACETAMINOPHEN 10-325 MG PO TABS: 1.0000 | ORAL_TABLET | Freq: Four times a day (QID) | ORAL | 0 refills | Status: DC | PRN

## 2021-02-04 NOTE — Patient Instructions (Signed)
Wheelchair Evaluation went to  Crystal Lee: Referral sent on 01/01/2021 [phone]

## 2021-02-04 NOTE — Progress Notes (Signed)
Subjective:    Patient ID: Crystal Lee, female    DOB: 1933/12/13, 87 y.o.   MRN: [id number]  HPI: Crystal Lee is a 87 y.o. female who returns for follow up appointment for chronic pain and medication refill. She states her  pain is located in her lower back, bilateral lower extremities with numbness, nilateral hip and bilateral knee pain. Also reports generalized joint pain. She rates her pain 5. Her current exercise regime is walking, she was encouraged to increase HEP using chair exercises. She verbalizes understanding. She refuses physical Therapy at this time. We will continue to monitor.   Crystal Lee is 40.00 MME.   Last Oral Swab was Performed on 12/09/2020, it was consistent.     Pain Inventory Average Pain 5 Pain Right Now 5 My pain is sharp, burning, and aching  In the last 24 hours, has pain interfered with the following? General activity 3 Relation with others 5 Enjoyment of life 4 What TIME of day is your pain at its worst? daytime, evening, and night Sleep (in general) Fair  Pain is worse with: walking, bending, standing, and some activites Pain improves with: rest, heat/ice, and medication Relief from Meds: 3  Family History  Problem Relation Age of Onset   Other Mother        complications from flu   Other Father        unsure of cause   Colon cancer Neg Hx    Social History   Socioeconomic History   Marital status: Widowed    Spouse name: Not on file   Number of children: 3   Years of education: 16 years   Highest education level: Not on file  Occupational History   Occupation: Retired  Tobacco Use   Smoking status: Former    Packs/day: 0.50    Years: 20.00    Pack years: 10.00    Types: Cigarettes    Quit date: 02/03/1976    Years since quitting: 45.0   Smokeless tobacco: Never  Vaping Use   Vaping Use: Never used  Substance and Sexual Activity   Alcohol use: No   Drug use: No   Sexual activity: Yes    Birth  control/protection: Post-menopausal  Other Topics Concern   Not on file  Social History Narrative   Lives at home with husband.   Right-handed.   No caffeine use.   Social Determinants of Health   Financial Resource Strain: Not on file  Food Insecurity: Not on file  Transportation Needs: Not on file  Physical Activity: Not on file  Stress: Not on file  Social Connections: Not on file   Past Surgical History:  Procedure Laterality Date   47 HOUR [place] STUDY N/A 06/21/2018   Procedure: 24 HOUR [place];  Surgeon: Lavena Bullion, DO;  [facility];  Service: Gastroenterology;  Laterality: N/A;  with impedance   ABDOMINAL HYSTERECTOMY  2010   ANAL RECTAL MANOMETRY N/A 09/19/2019   Procedure: ANO RECTAL MANOMETRY;  Surgeon: Mauri Pole, MD;  [facility];  Service: Endoscopy;  Laterality: N/A;   [place]   BIOPSY  06/10/2020   Procedure: BIOPSY;  Surgeon: Lavena Bullion, DO;  [facility];  Service: Gastroenterology;;  EGD and COLON   BREAST SURGERY     CATARACT EXTRACTION Bilateral 2011   CHOLECYSTECTOMY     COLON RESECTION N/A 06/12/2020   Procedure: LAPAROSCOPIC COLON RESECTION;  Surgeon: [name], MD;  [facility];  Service: General;  Laterality: N/A;   COLONOSCOPY WITH PROPOFOL N/A 06/10/2020   Procedure: COLONOSCOPY WITH PROPOFOL;  Surgeon: Lavena Bullion, DO;  [facility];  Service: Gastroenterology;  Laterality: N/A;   ESOPHAGEAL MANOMETRY N/A 06/21/2018   Procedure: ESOPHAGEAL MANOMETRY (EM);  Surgeon: Lavena Bullion, DO;  [facility];  Service: Gastroenterology;  Laterality: N/A;   ESOPHAGOGASTRODUODENOSCOPY (EGD) WITH PROPOFOL N/A 06/10/2020   Procedure: ESOPHAGOGASTRODUODENOSCOPY (EGD) WITH PROPOFOL;  Surgeon: Lavena Bullion, DO;  [facility];  Service: Gastroenterology;  Laterality: N/A;   GALLBLADDER SURGERY  2015   JOINT REPLACEMENT     RT  TOTAL HIP / RT TOTAL KNEE   MASTECTOMY  1998   BILATERAL    [place] IMPEDANCE STUDY  06/21/2018   Procedure: [place] IMPEDANCE STUDY;  Surgeon: Lavena Bullion, DO;  [facility];  Service: Gastroenterology;;   POLYPECTOMY  06/10/2020   Procedure: POLYPECTOMY;  Surgeon: Lavena Bullion, DO;  [facility];  Service: Gastroenterology;;   SKIN CANCER EXCISION  2016   RT SIDE OF NOSE   SUBMUCOSAL TATTOO INJECTION  06/10/2020   Procedure: SUBMUCOSAL TATTOO INJECTION;  Surgeon: Lavena Bullion, DO;  [facility];  Service: Gastroenterology;;   [place]  2010   RIGHT   TOTAL HIP ARTHROPLASTY Left 05/09/2015   Procedure: LEFT TOTAL HIP ARTHROPLASTY ANTERIOR APPROACH;  Surgeon: Gaynelle Arabian, MD;  [facility];  Service: Orthopedics;  Laterality: Left;   TOTAL KNEE ARTHROPLASTY  2001   TUMOR REMOVAL  2012   ABDOMINAL - NON CANCEROUS   Past Surgical History:  Procedure Laterality Date   12 HOUR [place] STUDY N/A 06/21/2018   Procedure: 24 HOUR [place] STUDY;  Surgeon: Lavena Bullion, DO;  [facility];  Service: Gastroenterology;  Laterality: N/A;  with impedance   ABDOMINAL HYSTERECTOMY  2010   ANAL RECTAL MANOMETRY N/A 09/19/2019   Procedure: ANO RECTAL MANOMETRY;  Surgeon: Mauri Pole, MD;  [facility];  Service: Endoscopy;  Laterality: N/A;   [place]   BIOPSY  06/10/2020   Procedure: BIOPSY;  Surgeon: Lavena Bullion, DO;  [facility];  Service: Gastroenterology;;  EGD and COLON   BREAST SURGERY     CATARACT EXTRACTION Bilateral 2011   CHOLECYSTECTOMY     COLON RESECTION N/A 06/12/2020   Procedure: LAPAROSCOPIC COLON RESECTION;  Surgeon: [name], MD;  [facility];  Service: General;  Laterality: N/A;   COLONOSCOPY WITH PROPOFOL N/A 06/10/2020   Procedure: COLONOSCOPY WITH PROPOFOL;  Surgeon: Lavena Bullion, DO;  [facility];   Service: Gastroenterology;  Laterality: N/A;   ESOPHAGEAL MANOMETRY N/A 06/21/2018   Procedure: ESOPHAGEAL MANOMETRY (EM);  Surgeon: Lavena Bullion, DO;  [facility];  Service: Gastroenterology;  Laterality: N/A;   ESOPHAGOGASTRODUODENOSCOPY (EGD) WITH PROPOFOL N/A 06/10/2020   Procedure: ESOPHAGOGASTRODUODENOSCOPY (EGD) WITH PROPOFOL;  Surgeon: Lavena Bullion, DO;  [facility];  Service: Gastroenterology;  Laterality: N/A;   GALLBLADDER SURGERY  2015   JOINT REPLACEMENT     RT TOTAL HIP / RT TOTAL KNEE   MASTECTOMY  1998   BILATERAL    [place] IMPEDANCE STUDY  06/21/2018   Procedure: [place] IMPEDANCE STUDY;  Surgeon: Lavena Bullion, DO;  [facility];  Service: Gastroenterology;;   POLYPECTOMY  06/10/2020   Procedure: POLYPECTOMY;  Surgeon:  Cirigliano, Vito V, DO;  [facility];  Service: Gastroenterology;;   SKIN CANCER EXCISION  2016   RT SIDE OF NOSE   SUBMUCOSAL TATTOO INJECTION  06/10/2020   Procedure: SUBMUCOSAL TATTOO INJECTION;  Surgeon: Lavena Bullion, DO;  [facility];  Service: Gastroenterology;;   [place]  2010   RIGHT   TOTAL HIP ARTHROPLASTY Left 05/09/2015   Procedure: LEFT TOTAL HIP ARTHROPLASTY ANTERIOR APPROACH;  Surgeon: Gaynelle Arabian, MD;  [facility];  Service: Orthopedics;  Laterality: Left;   TOTAL KNEE ARTHROPLASTY  2001   TUMOR REMOVAL  2012   ABDOMINAL - NON CANCEROUS   Past Medical History:  Diagnosis Date   Arthritis    Cancer ([place])    HX BREAST CANCER/ SKIN CANCER   Complication of anesthesia    N/V WITH MORPHINE   Difficulty sleeping    Fractured hip ([place])    LEFT - AUG 2016   GERD (gastroesophageal reflux disease)    Hyperlipidemia    Hypertension    Melanoma ([place])    Neuropathy    Nocturia    Osteopenia    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   PONV (postoperative nausea and vomiting)    PT STATES MORPHINE CAUSED N/V   Rosacea     Sleep apnea    Stage 1 breast cancer, ER+, right (HCC) 07/04/2017   BP (!) 147/74   Pulse 75   Temp 98.2 F (36.8 C)   Ht '5\' 6"'$  (1.676 m)   Wt 206 lb 3.2 oz (93.5 kg)   SpO2 96%   BMI 33.28 kg/m   Opioid Risk Score:   Fall Risk Score:  `1  Depression screen PHQ 2/9  Depression screen [facility] 2/9 02/04/2021 01/06/2021 01/06/2021 12/09/2020 09/22/2020 08/11/2020 05/20/2020  Decreased Interest 0 0 0 0 0 0 0  Down, Depressed, Hopeless 0 - 0 0 0 0 0  PHQ - 2 Score 0 0 0 0 0 0 0  Altered sleeping - - - - - - -  Tired, decreased energy - - - - - - -  Change in appetite - - - - - - -  Feeling bad or failure about yourself  - - - - - - -  Trouble concentrating - - - - - - -  Moving slowly or fidgety/restless - - - - - - -  Suicidal thoughts - - - - - - -  PHQ-9 Score - - - - - - -  Difficult doing work/chores - - - - - - -  Some recent data might be hidden     Review of Systems  Constitutional: Negative.   HENT: Negative.    Eyes: Negative.   Respiratory: Negative.    Cardiovascular: Negative.   Gastrointestinal: Negative.   Endocrine: Negative.   Genitourinary: Negative.   Musculoskeletal:  Positive for back pain.  Skin: Negative.   Allergic/Immunologic: Negative.   Neurological: Negative.   Hematological: Negative.   Psychiatric/Behavioral: Negative.    All other systems reviewed and are negative.     Objective:   Physical Exam Vitals and nursing note reviewed.  Constitutional:      Appearance: Normal appearance.  Cardiovascular:     Rate and Rhythm: Normal rate and regular rhythm.     Pulses: Normal pulses.     Heart sounds: Normal heart sounds.  Pulmonary:     Effort: Pulmonary effort is normal.     Breath sounds:  Normal breath sounds.  Musculoskeletal:     Cervical back: Normal range of motion and neck supple.     Comments: Normal Muscle Bulk and Muscle Testing Reveals:  Upper Extremities: Decreased ROM 90 Degrees and Muscle Strength  5/5 Lumbar Paraspinal Tenderness:  L-3-L-5 Lower Extremities : Right Decreased ROM and Muscle Strength 5/5 Right Lower Extremity Flexion Produces Pain into her Lumbar and Right Buttock Left Lower Extremity: Full ROM and Muscle Strength 5/5 Arises from chair slowly using walker for support Narrow Based Gait     Skin:    General: Skin is warm and dry.  Neurological:     Mental Status: She is alert and oriented to person, place, and time.  Psychiatric:        Mood and Affect: Mood normal.        Behavior: Behavior normal.         Assessment & Plan:  1. Chronic Bilateral Leg pain: Paresthesia: Continue to Monitor. 02/04/2021 2. Paresthesia Doreene Burke Radiculitis: Crystal Lee has weaned herself off the Lyrica due to daytime drowsiness. We will continue to monitor. 02/04/2021 3. Pain of Left Wrist/ : No complaints today. S/P Carpal Tunnel Release on 06/03/2017 by Dr. Ellene Route. Dr. Ellene Route Following. 02/04/2021. 4. Fracture of superior pubic ramus.  Dr. Wynelle Link Following.  Continue to monitor. 02/04/2021. 5. Bilateral Knee OA: Continue Voltaren Gel.  Continue to monitor. Orthopedist following. 02/04/2021 6. Polyarthralgia: Continue to alternate with heat and ice therapy. Continue current medication regime. Continue to monitor. 02/04/2021. 7. Chronic Pain Syndrome: Refilled::Hydrocodone 10/325 mg one tablet every 6 hours as needed for moderate pain #120. 02/04/2021. 8. Lumbar Compression Fracture L2 and L3:  Crystal Lee refused physical therapy. Continue with rest/ heat therapy. Continue to Monitor 02/04/2021. 9. Muscle Spasm: Continue  Flexeril 5 mg at HS. Continue to monitor. 02/04/2021. 10. Right Shoulder Tendonitis: No complaints today. Continue HEP as Tolerated. Alternate Ice and Heat Therapy. 02/04/2021   F/U in 1 Month

## 2021-02-23 ENCOUNTER — Ambulatory Visit: Payer: Medicare Other | Admitting: Cardiology

## 2021-02-23 DIAGNOSIS — H52203 Unspecified astigmatism, bilateral: Secondary | ICD-10-CM | POA: Diagnosis not present

## 2021-02-23 DIAGNOSIS — Z961 Presence of intraocular lens: Secondary | ICD-10-CM | POA: Diagnosis not present

## 2021-02-24 ENCOUNTER — Ambulatory Visit (INDEPENDENT_AMBULATORY_CARE_PROVIDER_SITE_OTHER): Payer: Medicare Other | Admitting: Cardiology

## 2021-02-24 ENCOUNTER — Other Ambulatory Visit: Payer: Self-pay

## 2021-02-24 ENCOUNTER — Encounter: Payer: Self-pay | Admitting: Cardiology

## 2021-02-24 VITALS — BP 132/72 | HR 68 | Ht 66.0 in | Wt 207.0 lb

## 2021-02-24 DIAGNOSIS — I5032 Chronic diastolic (congestive) heart failure: Secondary | ICD-10-CM | POA: Diagnosis not present

## 2021-02-24 DIAGNOSIS — I110 Hypertensive heart disease with heart failure: Secondary | ICD-10-CM

## 2021-02-24 DIAGNOSIS — E785 Hyperlipidemia, unspecified: Secondary | ICD-10-CM

## 2021-02-24 NOTE — Patient Instructions (Signed)

## 2021-02-24 NOTE — Progress Notes (Signed)
Cardiology Office Note:    Date:  02/24/2021   ID:  Crystal Lee, DOB 06-06-1933, MRN [id number]  PCP:  Darreld Mclean, MD  Cardiologist:  Shirlee More, MD    Referring MD: Darreld Mclean, MD    ASSESSMENT:    1. Hypertensive heart disease with chronic diastolic congestive heart failure ([place])   2. Dyslipidemia    PLAN:    In order of problems listed above:  She continues to do well BP is at target on low-dose ARB and at this time does not require a diuretic but has it at home if needed for edema and shortness of breath.  Continue current treatment Lipids at target continue her statin   Next appointment: At this time we will see her back in the future as needed   Medication Adjustments/Labs and Tests Ordered: Current medicines are reviewed at length with the patient today.  Concerns regarding medicines are outlined above.  No orders of the defined types were placed in this encounter.  No orders of the defined types were placed in this encounter.   Chief Complaint  Patient presents with   Follow-up   Congestive Heart Failure   Hypertension     History of Present Illness:    Crystal Lee is a 87 y.o. female with a hx of hypertensive heart disease with chronic diastolic heart failure and hyperlipidemia last seen 08/19/2020.  She sees oncology Dr. Marin Olp with stage IIIb advanced colon cancer with 3+ lymph nodes presently on surveillance phase  Compliance with diet, lifestyle and medications: Yes  She has a prescription for furosemide as needed has not needed it. She has multiple musculoskeletal complaints limitations uses a walker but is not having edema shortness of breath chest pain palpitation or syncope and will be seen by oncology later this afternoon. 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   PONV (postoperative nausea and vomiting)    PT STATES MORPHINE CAUSED N/V   Rosacea    Sleep apnea    Stage 1 breast cancer, ER+, right ([place]) 07/04/2017    Past Surgical History:  Procedure Laterality Date   22 HOUR [place] STUDY N/A 06/21/2018   Procedure: 24 HOUR [place] STUDY;  Surgeon: Lavena Bullion, DO;  [facility];  Service: Gastroenterology;  Laterality: N/A;  with impedance   ABDOMINAL HYSTERECTOMY  2010   ANAL RECTAL MANOMETRY N/A 09/19/2019   Procedure: ANO RECTAL MANOMETRY;  Surgeon: Mauri Pole, MD;  [facility];  Service: Endoscopy;  Laterality: N/A;   [place]   BIOPSY  06/10/2020   Procedure: BIOPSY;  Surgeon: Lavena Bullion, DO;  [facility];  Service: Gastroenterology;;  EGD and COLON   BREAST SURGERY     CATARACT EXTRACTION Bilateral 2011   CHOLECYSTECTOMY     COLON RESECTION N/A 06/12/2020   Procedure: LAPAROSCOPIC COLON RESECTION;  Surgeon: [name], MD;  [facility];  Service: General;  Laterality: N/A;   COLONOSCOPY  WITH PROPOFOL N/A 06/10/2020   Procedure: COLONOSCOPY WITH PROPOFOL;  Surgeon: Lavena Bullion, DO;  [facility];  Service: Gastroenterology;  Laterality: N/A;   ESOPHAGEAL MANOMETRY N/A 06/21/2018   Procedure: ESOPHAGEAL MANOMETRY (EM);  Surgeon: Lavena Bullion, DO;  [facility];  Service: Gastroenterology;  Laterality: N/A;   ESOPHAGOGASTRODUODENOSCOPY (EGD) WITH PROPOFOL N/A 06/10/2020   Procedure: ESOPHAGOGASTRODUODENOSCOPY (EGD) WITH PROPOFOL;  Surgeon: Lavena Bullion, DO;  [facility];  Service: Gastroenterology;  Laterality: N/A;   GALLBLADDER SURGERY  2015   JOINT REPLACEMENT     RT TOTAL HIP / RT TOTAL KNEE    MASTECTOMY  1998   BILATERAL    [place] IMPEDANCE STUDY  06/21/2018   Procedure: [place] IMPEDANCE STUDY;  Surgeon: Lavena Bullion, DO;  [facility];  Service: Gastroenterology;;   POLYPECTOMY  06/10/2020   Procedure: POLYPECTOMY;  Surgeon: Lavena Bullion, DO;  [facility];  Service: Gastroenterology;;   SKIN CANCER EXCISION  2016   RT SIDE OF NOSE   SUBMUCOSAL TATTOO INJECTION  06/10/2020   Procedure: SUBMUCOSAL TATTOO INJECTION;  Surgeon: Lavena Bullion, DO;  [facility];  Service: Gastroenterology;;   [place]  2010   RIGHT   TOTAL HIP ARTHROPLASTY Left 05/09/2015   Procedure: LEFT TOTAL HIP ARTHROPLASTY ANTERIOR APPROACH;  Surgeon: Gaynelle Arabian, MD;  [facility];  Service: Orthopedics;  Laterality: Left;   TOTAL KNEE ARTHROPLASTY  2001   TUMOR REMOVAL  2012   ABDOMINAL - NON CANCEROUS    Current Medications: Current Meds  Medication Sig   cyclobenzaprine (FLEXERIL) 5 MG tablet Take 1 tablet (5 mg total) by mouth at bedtime as needed for muscle spasms.   diphenoxylate-atropine (LOMOTIL) 2.5-0.025 MG tablet Take 1 tablet by mouth 4 (four) times daily as needed for diarrhea or loose stools.   estradiol (ESTRACE VAGINAL) 0.1 MG/GM vaginal cream Apply 1gm vaginally 1-3x a week as needed for comfort   fenofibrate (TRICOR) 48 MG tablet Take 1 tablet (48 mg total) by mouth daily.   HYDROcodone-acetaminophen (NORCO) 10-325 MG tablet Take 1 tablet by mouth every 6 (six) hours as needed. (Patient taking differently: Take 1 tablet by mouth every 6 (six) hours as needed for moderate pain or severe pain.)   ipratropium (ATROVENT) 0.03 % nasal spray Place 2 sprays into both nostrils 4 (four) times daily as needed for rhinitis.   loperamide (IMODIUM A-D) 2 MG tablet Take 2 mg by mouth 4 (four) times daily as needed for diarrhea or loose stools.   losartan (COZAAR) 25 MG tablet Take 1 tablet (25 mg total) by mouth daily.    Multiple Vitamins-Minerals (MULTIVITAMIN ADULTS) TABS Take 1 tablet by mouth daily.   naloxegol oxalate (MOVANTIK) 12.5 MG TABS tablet Take 1 tablet (12.5 mg total) by mouth daily.   neomycin-polymyxin b-dexamethasone (MAXITROL) 3.5-10000-0.1 SUSP Place 1 drop into the left eye daily as needed (dye eye).   phenazopyridine (PYRIDIUM) 200 MG tablet Take 1 tablet (200 mg total) by mouth 3 (three) times daily as needed for pain.   promethazine (PHENERGAN) 12.5 MG tablet Take 1 tablet (12.5 mg total) by mouth every 6 (six) hours as needed for nausea or vomiting.   valACYclovir (VALTREX) 1000 MG tablet Take 1,000 mg by mouth daily.     Allergies:   Darvon [propoxyphene], Adhesive [tape], and Morphine and related   Social History   Socioeconomic History   Marital status: Widowed  Spouse name: Not on file   Number of children: 3   Years of education: 16 years   Highest education level: Not on file  Occupational History   Occupation: Retired  Tobacco Use   Smoking status: Former    Packs/day: 0.50    Years: 20.00    Pack years: 10.00    Types: Cigarettes    Quit date: 02/03/1976    Years since quitting: 45.0   Smokeless tobacco: Never  Vaping Use   Vaping Use: Never used  Substance and Sexual Activity   Alcohol use: No   Drug use: No   Sexual activity: Yes    Birth control/protection: Post-menopausal  Other Topics Concern   Not on file  Social History Narrative   Lives at home with husband.   Right-handed.   No caffeine use.   Social Determinants of Health   Financial Resource Strain: Not on file  Food Insecurity: Not on file  Transportation Needs: Not on file  Physical Activity: Not on file  Stress: Not on file  Social Connections: Not on file     Family History: The patient's family history includes Other in her father and mother. There is no history of Colon cancer. ROS:   Please see the history of present illness.    All other systems reviewed and are  negative.  EKGs/Labs/Other Studies Reviewed:    The following studies were reviewed today:    Recent Labs: 02/25/2020: NT-Pro BNP 90 09/22/2020: TSH 1.95 01/06/2021: ALT 25; BUN 9; Creatinine 0.66; Hemoglobin 13.7; Platelet Count 127; Potassium 4.4; Sodium 139  Recent Lipid Panel    Component Value Date/Time   CHOL 144 07/17/2020 1146   TRIG 131 07/17/2020 1146   HDL 33 (L) 07/17/2020 1146   CHOLHDL 4.4 07/17/2020 1146   VLDL 26 07/17/2020 1146   LDLCALC 85 07/17/2020 1146    Physical Exam:    VS:  BP 132/72 (BP Location: Left Arm, Patient Position: Sitting, Cuff Size: Large)   Pulse 68   Ht 5\' 6"  (1.676 m)   Wt 207 lb 0.6 oz (93.9 kg)   SpO2 97%   BMI 33.42 kg/m     Wt Readings from Last 3 Encounters:  02/24/21 207 lb 0.6 oz (93.9 kg)  02/04/21 206 lb 3.2 oz (93.5 kg)  01/06/21 206 lb (93.4 kg)     GEN:  Well nourished, well developed in no acute distress HEENT: Normal NECK: No JVD; No carotid bruits LYMPHATICS: No lymphadenopathy CARDIAC: RRR, no murmurs, rubs, gallops RESPIRATORY:  Clear to auscultation without rales, wheezing or rhonchi  ABDOMEN: Soft, non-tender, non-distended MUSCULOSKELETAL:  No edema; No deformity  SKIN: Warm and dry NEUROLOGIC:  Alert and oriented x 3 PSYCHIATRIC:  Normal affect    Signed, Shirlee More, MD  02/24/2021 1:51 PM    [facility]

## 2021-02-25 ENCOUNTER — Other Ambulatory Visit: Payer: Self-pay | Admitting: *Deleted

## 2021-02-25 DIAGNOSIS — C186 Malignant neoplasm of descending colon: Secondary | ICD-10-CM

## 2021-02-26 ENCOUNTER — Ambulatory Visit (HOSPITAL_BASED_OUTPATIENT_CLINIC_OR_DEPARTMENT_OTHER)
Admission: RE | Admit: 2021-02-26 | Discharge: 2021-02-26 | Disposition: A | Discharge status: Home / Self Care | Payer: Medicare Other | Source: Ambulatory Visit | Attending: Hematology & Oncology | Admitting: Hematology & Oncology

## 2021-02-26 ENCOUNTER — Other Ambulatory Visit: Payer: Self-pay

## 2021-02-26 ENCOUNTER — Other Ambulatory Visit: Payer: Medicare Other

## 2021-02-26 ENCOUNTER — Inpatient Hospital Stay: Payer: Medicare Other | Attending: Hematology & Oncology

## 2021-02-26 ENCOUNTER — Telehealth: Payer: Self-pay | Admitting: Family Medicine

## 2021-02-26 DIAGNOSIS — C186 Malignant neoplasm of descending colon: Secondary | ICD-10-CM

## 2021-02-26 DIAGNOSIS — R300 Dysuria: Secondary | ICD-10-CM | POA: Diagnosis not present

## 2021-02-26 DIAGNOSIS — K7689 Other specified diseases of liver: Secondary | ICD-10-CM | POA: Diagnosis not present

## 2021-02-26 DIAGNOSIS — C189 Malignant neoplasm of colon, unspecified: Secondary | ICD-10-CM | POA: Diagnosis not present

## 2021-02-26 DIAGNOSIS — Z853 Personal history of malignant neoplasm of breast: Secondary | ICD-10-CM | POA: Diagnosis not present

## 2021-02-26 DIAGNOSIS — I700 Atherosclerosis of aorta: Secondary | ICD-10-CM | POA: Diagnosis not present

## 2021-02-26 DIAGNOSIS — K439 Ventral hernia without obstruction or gangrene: Secondary | ICD-10-CM | POA: Diagnosis not present

## 2021-02-26 DIAGNOSIS — K6389 Other specified diseases of intestine: Secondary | ICD-10-CM | POA: Diagnosis not present

## 2021-02-26 DIAGNOSIS — Z85038 Personal history of other malignant neoplasm of large intestine: Secondary | ICD-10-CM | POA: Insufficient documentation

## 2021-02-26 DIAGNOSIS — I2510 Atherosclerotic heart disease of native coronary artery without angina pectoris: Secondary | ICD-10-CM | POA: Diagnosis not present

## 2021-02-26 DIAGNOSIS — R590 Localized enlarged lymph nodes: Secondary | ICD-10-CM | POA: Diagnosis not present

## 2021-02-26 LAB — COMPREHENSIVE METABOLIC PANEL
ALT: 16 U/L (ref 0–44)
AST: 22 U/L (ref 15–41)
Albumin: 4.4 g/dL (ref 3.5–5.0)
Alkaline Phosphatase: 77 U/L (ref 38–126)
Anion gap: 8 (ref 5–15)
BUN: 9 mg/dL (ref 8–23)
CO2: 28 mmol/L (ref 22–32)
Calcium: 9.8 mg/dL (ref 8.9–10.3)
Chloride: 103 mmol/L (ref 98–111)
Creatinine, Ser: 0.68 mg/dL (ref 0.44–1.00)
GFR, Estimated: >60 mL/min (ref >60)
Glucose, Bld: 98 mg/dL (ref 70–99)
Potassium: 4.1 mmol/L (ref 3.5–5.1)
Sodium: 139 mmol/L (ref 135–145)
Total Bilirubin: 0.8 mg/dL (ref 0.3–1.2)
Total Protein: 7.6 g/dL (ref 6.5–8.1)

## 2021-02-26 LAB — CBC WITH DIFFERENTIAL (CANCER CENTER ONLY)
Abs Immature Granulocytes: 0.01 10*3/uL (ref 0.00–0.07)
Basophils Absolute: 0 10*3/uL (ref 0.0–0.1)
Basophils Relative: 0 %
Eosinophils Absolute: 0.2 10*3/uL (ref 0.0–0.5)
Eosinophils Relative: 3 %
HCT: 41.6 % (ref 36.0–46.0)
Hemoglobin: 13.8 g/dL (ref 12.0–15.0)
Immature Granulocytes: 0 %
Lymphocytes Relative: 36 %
Lymphs Abs: 2.5 10*3/uL (ref 0.7–4.0)
MCH: 30.4 pg (ref 26.0–34.0)
MCHC: 33.2 g/dL (ref 30.0–36.0)
MCV: 91.6 fL (ref 80.0–100.0)
Monocytes Absolute: 0.4 10*3/uL (ref 0.1–1.0)
Monocytes Relative: 6 %
Neutro Abs: 3.9 10*3/uL (ref 1.7–7.7)
Neutrophils Relative %: 55 %
Platelet Count: 129 10*3/uL — ABNORMAL LOW (ref 150–400)
RBC: 4.54 MIL/uL (ref 3.87–5.11)
RDW: 12.9 % (ref 11.5–15.5)
WBC Count: 7 10*3/uL (ref 4.0–10.5)
nRBC: 0 % (ref 0.0–0.2)

## 2021-02-26 LAB — LACTATE DEHYDROGENASE: LDH: 174 U/L (ref 98–192)

## 2021-02-26 LAB — CEA (IN HOUSE-CHCC): CEA (CHCC-In House): 1.82 ng/mL (ref 0.00–5.00)

## 2021-02-26 MED ORDER — AMOXICILLIN-POT CLAVULANATE 500-125 MG PO TABS: 1.0000 | ORAL_TABLET | Freq: Two times a day (BID) | ORAL | 0 refills | Status: DC

## 2021-02-26 MED ORDER — IOHEXOL 350 MG/ML SOLN
100.0000 mL | Freq: Once | INTRAVENOUS | Status: AC | PRN
  Administered 2021-02-26: 85 mL via INTRAVENOUS

## 2021-02-26 NOTE — Telephone Encounter (Signed)
Pt last treated for a UTI 01/05/21. Okay to just bring a urine?

## 2021-02-26 NOTE — Telephone Encounter (Signed)
Patient states she thinks she has another UTI, she would like to bring an urine sample to the lab. Please advice.

## 2021-02-26 NOTE — Telephone Encounter (Signed)
Ordered and collected.

## 2021-02-26 NOTE — Telephone Encounter (Signed)
Urine culture collected. Pt would like to go ahead and start on abx Rx called in  Meds ordered this encounter  Medications   amoxicillin-clavulanate (AUGMENTIN) 500-125 MG tablet    Sig: Take 1 tablet (500 mg total) by mouth 2 (two) times daily.    Dispense:  14 tablet    Refill:  0

## 2021-02-27 ENCOUNTER — Telehealth: Payer: Self-pay | Admitting: *Deleted

## 2021-02-27 ENCOUNTER — Encounter: Payer: Self-pay | Admitting: *Deleted

## 2021-02-27 NOTE — Telephone Encounter (Signed)
As noted below by Dr. Marin Olp, I informed the patient that the CT scan looks fantastic! There is no obvious evidence of any cancer.She verbalized understanding.

## 2021-02-27 NOTE — Telephone Encounter (Signed)
-----   Message from [name], MD sent at 02/27/2021  2:38 PM EDT ----- Please call and tell her that the CT scan looks fantastic.  There is no obvious evidence of any cancer.  Thanks.  Laurey Arrow

## 2021-02-27 NOTE — Progress Notes (Signed)
Oncology Nurse Navigator Documentation  Oncology Nurse Navigator Flowsheets 02/27/2021  Confirmed Diagnosis Date -  Diagnosis Status -  Planned Course of Treatment -  Phase of Treatment -  Chemotherapy Pending- Reason: -  Chemotherapy Actual Start Date: -  Navigator Follow Up Date: 03/09/2021  Navigator Follow Up Reason: Follow-up Appointment  Navigator Location [facility]  Navigator Encounter Type Scan Review  Telephone -  Treatment Initiated Date -  Patient Visit Type MedOnc  Treatment Phase Active Tx  Barriers/Navigation Needs Coordination of Care;Education;Anxiety;Transportation  Education -  Interventions None Required  Acuity Level 2-Minimal Needs (1-2 Barriers Identified)  Coordination of Care -  Education Method -  Time Spent with Patient 15

## 2021-02-28 LAB — URINE CULTURE
MICRO NUMBER:: 12439860
SPECIMEN QUALITY:: ADEQUATE

## 2021-03-05 ENCOUNTER — Other Ambulatory Visit: Payer: Self-pay

## 2021-03-05 ENCOUNTER — Encounter: Payer: Medicare Other | Attending: Physical Medicine & Rehabilitation | Admitting: Registered Nurse

## 2021-03-05 VITALS — BP 140/80 | HR 65 | Temp 98.5°F | Ht 66.0 in | Wt 207.4 lb

## 2021-03-05 DIAGNOSIS — S32000S Wedge compression fracture of unspecified lumbar vertebra, sequela: Secondary | ICD-10-CM

## 2021-03-05 DIAGNOSIS — Z79899 Other long term (current) drug therapy: Secondary | ICD-10-CM | POA: Diagnosis not present

## 2021-03-05 DIAGNOSIS — M7062 Trochanteric bursitis, left hip: Secondary | ICD-10-CM | POA: Diagnosis not present

## 2021-03-05 DIAGNOSIS — R202 Paresthesia of skin: Secondary | ICD-10-CM | POA: Diagnosis not present

## 2021-03-05 DIAGNOSIS — G894 Chronic pain syndrome: Secondary | ICD-10-CM

## 2021-03-05 DIAGNOSIS — Z5181 Encounter for therapeutic drug level monitoring: Secondary | ICD-10-CM

## 2021-03-05 DIAGNOSIS — M961 Postlaminectomy syndrome, not elsewhere classified: Secondary | ICD-10-CM | POA: Diagnosis not present

## 2021-03-05 DIAGNOSIS — M2550 Pain in unspecified joint: Secondary | ICD-10-CM

## 2021-03-05 DIAGNOSIS — M62838 Other muscle spasm: Secondary | ICD-10-CM

## 2021-03-05 DIAGNOSIS — M7061 Trochanteric bursitis, right hip: Secondary | ICD-10-CM

## 2021-03-05 DIAGNOSIS — M170 Bilateral primary osteoarthritis of knee: Secondary | ICD-10-CM | POA: Diagnosis not present

## 2021-03-05 MED ORDER — HYDROCODONE-ACETAMINOPHEN 10-325 MG PO TABS: 1.0000 | ORAL_TABLET | Freq: Four times a day (QID) | ORAL | 0 refills | Status: DC | PRN

## 2021-03-05 NOTE — Progress Notes (Signed)
Subjective:    Patient ID: Crystal Lee, female    DOB: [birth year], 87 y.o.   MRN: [id number]  HPI: Crystal Lee is a 87 y.o. female who returns for follow up appointment for chronic pain and medication refill. She states her pain is located in her lower back pain radiating into her bilateral hips and generalized joint pain. She rates her pain 4. Her current exercise regime is walking and performing stretching exercises.  Ms. Haertel Morphine equivalent is 40.00 MME.   Last Oral Swab was Performed on 12/09/2020, it was consistent.     Pain Inventory Average Pain 4 Pain Right Now 4 My pain is sharp, burning, and aching  In the last 24 hours, has pain interfered with the following? General activity 4 Relation with others 4 Enjoyment of life 4 What TIME of day is your pain at its worst? daytime, evening, and night Sleep (in general) Fair  Pain is worse with: walking, bending, standing, and some activites Pain improves with: rest, heat/ice, and medication Relief from Meds: 3  Family History  Problem Relation Age of Onset   Other Mother        complications from flu   Other Father        unsure of cause   Colon cancer Neg Hx    Social History   Socioeconomic History   Marital status: Widowed    Spouse name: Not on file   Number of children: 3   Years of education: 16 years   Highest education level: Not on file  Occupational History   Occupation: Retired  Tobacco Use   Smoking status: Former    Packs/day: 0.50    Years: 20.00    Pack years: 10.00    Types: Cigarettes    Quit date: 02/03/1976    Years since quitting: 45.1   Smokeless tobacco: Never  Vaping Use   Vaping Use: Never used  Substance and Sexual Activity   Alcohol use: No   Drug use: No   Sexual activity: Yes    Birth control/protection: Post-menopausal  Other Topics Concern   Not on file  Social History Narrative   Lives at home with husband.   Right-handed.   No caffeine use.   Social  Determinants of Health   Financial Resource Strain: Not on file  Food Insecurity: Not on file  Transportation Needs: Not on file  Physical Activity: Not on file  Stress: Not on file  Social Connections: Not on file   Past Surgical History:  Procedure Laterality Date   77 HOUR [place] STUDY N/A 06/21/2018   Procedure: 24 HOUR [place];  Surgeon: Lavena Bullion, DO;  [facility];  Service: Gastroenterology;  Laterality: N/A;  with impedance   ABDOMINAL HYSTERECTOMY  2010   ANAL RECTAL MANOMETRY N/A 09/19/2019   Procedure: ANO RECTAL MANOMETRY;  Surgeon: Mauri Pole, MD;  [facility];  Service: Endoscopy;  Laterality: N/A;   [place]   BIOPSY  06/10/2020   Procedure: BIOPSY;  Surgeon: Lavena Bullion, DO;  [facility];  Service: Gastroenterology;;  EGD and COLON   BREAST SURGERY     CATARACT EXTRACTION Bilateral 2011   CHOLECYSTECTOMY     COLON RESECTION N/A 06/12/2020   Procedure: LAPAROSCOPIC COLON RESECTION;  Surgeon: [name], MD;  [facility];  Service: General;  Laterality: N/A;   COLONOSCOPY WITH PROPOFOL N/A 06/10/2020   Procedure: COLONOSCOPY WITH PROPOFOL;  Surgeon: Lavena Bullion, DO;  [facility];  Service: Gastroenterology;  Laterality: N/A;   ESOPHAGEAL MANOMETRY N/A 06/21/2018   Procedure: ESOPHAGEAL MANOMETRY (EM);  Surgeon: Lavena Bullion, DO;  [facility];  Service: Gastroenterology;  Laterality: N/A;   ESOPHAGOGASTRODUODENOSCOPY (EGD) WITH PROPOFOL N/A 06/10/2020   Procedure: ESOPHAGOGASTRODUODENOSCOPY (EGD) WITH PROPOFOL;  Surgeon: Lavena Bullion, DO;  [facility];  Service: Gastroenterology;  Laterality: N/A;   GALLBLADDER SURGERY  2015   JOINT REPLACEMENT     RT TOTAL HIP / RT TOTAL KNEE   MASTECTOMY  1998   BILATERAL    [place] IMPEDANCE STUDY  06/21/2018   Procedure: [place] IMPEDANCE STUDY;  Surgeon: Lavena Bullion, DO;  [facility];  Service: Gastroenterology;;   POLYPECTOMY  06/10/2020   Procedure: POLYPECTOMY;  Surgeon: Lavena Bullion, DO;  [facility];  Service: Gastroenterology;;   SKIN CANCER EXCISION  2016   RT SIDE OF NOSE   SUBMUCOSAL TATTOO INJECTION  06/10/2020   Procedure: SUBMUCOSAL TATTOO INJECTION;  Surgeon: Lavena Bullion, DO;  [facility];  Service: Gastroenterology;;   [place]  2010   RIGHT   TOTAL HIP ARTHROPLASTY Left 05/09/2015   Procedure: LEFT TOTAL HIP ARTHROPLASTY ANTERIOR APPROACH;  Surgeon: Gaynelle Arabian, MD;  [facility];  Service: Orthopedics;  Laterality: Left;   TOTAL KNEE ARTHROPLASTY  2001   TUMOR REMOVAL  2012   ABDOMINAL - NON CANCEROUS   Past Surgical History:  Procedure Laterality Date   13 HOUR [place] STUDY N/A 06/21/2018   Procedure: 24 HOUR [place] STUDY;  Surgeon: Lavena Bullion, DO;  [facility];  Service: Gastroenterology;  Laterality: N/A;  with impedance   ABDOMINAL HYSTERECTOMY  2010   ANAL RECTAL MANOMETRY N/A 09/19/2019   Procedure: ANO RECTAL MANOMETRY;  Surgeon: Mauri Pole, MD;  [facility];  Service: Endoscopy;  Laterality: N/A;   [place]   BIOPSY  06/10/2020   Procedure: BIOPSY;  Surgeon: Lavena Bullion, DO;  [facility];  Service: Gastroenterology;;  EGD and COLON   BREAST SURGERY     CATARACT EXTRACTION Bilateral 2011   CHOLECYSTECTOMY     COLON RESECTION N/A 06/12/2020   Procedure: LAPAROSCOPIC COLON RESECTION;  Surgeon: [name], MD;  [facility];  Service: General;  Laterality: N/A;   COLONOSCOPY WITH PROPOFOL N/A 06/10/2020   Procedure: COLONOSCOPY WITH PROPOFOL;  Surgeon: Lavena Bullion, DO;  [facility];  Service: Gastroenterology;  Laterality: N/A;   ESOPHAGEAL MANOMETRY N/A 06/21/2018   Procedure: ESOPHAGEAL MANOMETRY (EM);  Surgeon: Lavena Bullion, DO;  [facility];  Service: Gastroenterology;  Laterality: N/A;   ESOPHAGOGASTRODUODENOSCOPY (EGD) WITH PROPOFOL N/A 06/10/2020   Procedure: ESOPHAGOGASTRODUODENOSCOPY (EGD) WITH PROPOFOL;  Surgeon: Lavena Bullion, DO;  [facility];  Service: Gastroenterology;  Laterality: N/A;   GALLBLADDER SURGERY  2015   JOINT REPLACEMENT     RT TOTAL HIP / RT TOTAL KNEE   MASTECTOMY  1998   BILATERAL    [place] IMPEDANCE STUDY  06/21/2018   Procedure: [place] IMPEDANCE STUDY;  Surgeon: Lavena Bullion, DO;  [facility];  Service: Gastroenterology;;   POLYPECTOMY  06/10/2020   Procedure: POLYPECTOMY;  Surgeon: Lavena Bullion, DO;  [facility];  Service: Gastroenterology;;   SKIN CANCER EXCISION  2016   RT SIDE OF NOSE   SUBMUCOSAL TATTOO  INJECTION  06/10/2020   Procedure: SUBMUCOSAL TATTOO INJECTION;  Surgeon: Lavena Bullion, DO;  [facility];  Service: Gastroenterology;;   TONSILLECTOMY  1953   TOTAL HIP ARTHROPLASTY  2010   RIGHT   TOTAL HIP ARTHROPLASTY Left 05/09/2015   Procedure: LEFT TOTAL HIP ARTHROPLASTY ANTERIOR APPROACH;  Surgeon: Gaynelle Arabian, MD;  [facility];  Service: Orthopedics;  Laterality: Left;   TOTAL KNEE ARTHROPLASTY  2001   TUMOR REMOVAL  2012   ABDOMINAL - NON CANCEROUS   Past Medical History:  Diagnosis Date   Arthritis    Cancer ([place])    HX BREAST CANCER/ SKIN CANCER   Complication of anesthesia    N/V WITH MORPHINE   Difficulty sleeping    Fractured hip ([place])    LEFT - AUG 2016   GERD (gastroesophageal reflux disease)    Hyperlipidemia    Hypertension    Melanoma ([place])    Neuropathy    Nocturia    Osteopenia    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   PONV (postoperative nausea and vomiting)    PT STATES MORPHINE CAUSED N/V   Rosacea    Sleep apnea    Stage 1 breast cancer, ER+, right (HCC) 07/04/2017   BP 140/80   Pulse 65   Temp 98.5 F (36.9 C) (Oral)   Ht 5\' 6"  (1.676 m)   Wt 207 lb 6.4 oz (94.1  kg)   SpO2 94%   BMI 33.48 kg/m   Opioid Risk Score:   Fall Risk Score:  `1  Depression screen PHQ 2/9  Depression screen [facility] 2/9 02/04/2021 01/06/2021 01/06/2021 12/09/2020 09/22/2020 08/11/2020 05/20/2020  Decreased Interest 0 0 0 0 0 0 0  Down, Depressed, Hopeless 0 - 0 0 0 0 0  PHQ - 2 Score 0 0 0 0 0 0 0  Altered sleeping - - - - - - -  Tired, decreased energy - - - - - - -  Change in appetite - - - - - - -  Feeling bad or failure about yourself  - - - - - - -  Trouble concentrating - - - - - - -  Moving slowly or fidgety/restless - - - - - - -  Suicidal thoughts - - - - - - -  PHQ-9 Score - - - - - - -  Difficult doing work/chores - - - - - - -  Some recent data might be hidden     Review of Systems  Musculoskeletal:  Positive for back pain.  All other systems reviewed and are negative.     Objective:   Physical Exam Vitals and nursing note reviewed.  Constitutional:      Appearance: Normal appearance.  Cardiovascular:     Rate and Rhythm: Normal rate and regular rhythm.     Pulses: Normal pulses.     Heart sounds: Normal heart sounds.  Pulmonary:     Effort: Pulmonary effort is normal.     Breath sounds: Normal breath sounds.  Musculoskeletal:     Cervical back: Normal range of motion and neck supple.     Comments: Normal Muscle Bulk and Muscle Testing Reveals:  Upper Extremities: Full ROM and Muscle Strength 5/5 Thoracic and Lumbar Hypersensitivity  Bilateral Greater Trochanter Tenderness Lower Extremities: Full ROM and Muscle Strength 5/5 Arises from chair slowly with Walker Narrow Based Gait     Skin:    General: Skin is warm and dry.  Neurological:  Mental Status: She is alert and oriented to person, place, and time.  Psychiatric:        Mood and Affect: Mood normal.        Behavior: Behavior normal.         Assessment & Plan:  1. Chronic Bilateral Leg pain: Paresthesia: Continue to Monitor. 03/05/2021 2. Paresthesia Doreene Burke Radiculitis: Ms.  Kobel has weaned herself off the Lyrica due to daytime drowsiness. We will continue to monitor. 03/05/2021 3. Pain of Left Wrist/ : No complaints today. S/P Carpal Tunnel Release on 06/03/2017 by Dr. Ellene Route. Dr. Ellene Route Following. 03/05/2021. 4. Fracture of superior pubic ramus.  Dr. Wynelle Link Following.  Continue to monitor. 03/05/2021. 5. Bilateral Knee OA: Continue Voltaren Gel.  Continue to monitor. Orthopedist following. 03/05/2021 6. Polyarthralgia: Continue to alternate with heat and ice therapy. Continue current medication regime. Continue to monitor. 03/05/2021. 7. Chronic Pain Syndrome: Refilled::Hydrocodone 10/325 mg one tablet every 6 hours as needed for moderate pain #120. 03/05/2021. 8. Lumbar Compression Fracture L2 and L3:  Ms. Mohl refused physical therapy. Continue with rest/ heat therapy. Continue to Monitor 03/05/2021. 9. Muscle Spasm: Continue  Flexeril 5 mg at HS. Continue to monitor. 03/05/2021. 10. Right Shoulder Tendonitis: No complaints today. Continue HEP as Tolerated. Alternate Ice and Heat Therapy. 03/05/2021   F/U in 1 Month

## 2021-03-09 ENCOUNTER — Encounter: Payer: Self-pay | Admitting: *Deleted

## 2021-03-09 ENCOUNTER — Inpatient Hospital Stay: Payer: Medicare Other

## 2021-03-09 ENCOUNTER — Other Ambulatory Visit: Payer: Self-pay

## 2021-03-09 ENCOUNTER — Telehealth: Payer: Self-pay | Admitting: Hematology & Oncology

## 2021-03-09 ENCOUNTER — Inpatient Hospital Stay: Payer: Medicare Other | Attending: Hematology & Oncology | Admitting: Hematology & Oncology

## 2021-03-09 ENCOUNTER — Other Ambulatory Visit: Payer: Medicare Other

## 2021-03-09 VITALS — BP 137/58 | HR 68 | Temp 98.3°F | Resp 18 | Wt 207.0 lb

## 2021-03-09 DIAGNOSIS — Z79899 Other long term (current) drug therapy: Secondary | ICD-10-CM | POA: Diagnosis not present

## 2021-03-09 DIAGNOSIS — K439 Ventral hernia without obstruction or gangrene: Secondary | ICD-10-CM | POA: Insufficient documentation

## 2021-03-09 DIAGNOSIS — Z87440 Personal history of urinary (tract) infections: Secondary | ICD-10-CM | POA: Insufficient documentation

## 2021-03-09 DIAGNOSIS — Z853 Personal history of malignant neoplasm of breast: Secondary | ICD-10-CM | POA: Diagnosis not present

## 2021-03-09 DIAGNOSIS — C186 Malignant neoplasm of descending colon: Secondary | ICD-10-CM | POA: Diagnosis not present

## 2021-03-09 DIAGNOSIS — M810 Age-related osteoporosis without current pathological fracture: Secondary | ICD-10-CM | POA: Diagnosis not present

## 2021-03-09 DIAGNOSIS — C184 Malignant neoplasm of transverse colon: Secondary | ICD-10-CM | POA: Diagnosis not present

## 2021-03-09 LAB — CMP (CANCER CENTER ONLY)
ALT: 14 U/L (ref 0–44)
AST: 20 U/L (ref 15–41)
Albumin: 4.2 g/dL (ref 3.5–5.0)
Alkaline Phosphatase: 76 U/L (ref 38–126)
Anion gap: 6 (ref 5–15)
BUN: 13 mg/dL (ref 8–23)
CO2: 28 mmol/L (ref 22–32)
Calcium: 10.2 mg/dL (ref 8.9–10.3)
Chloride: 105 mmol/L (ref 98–111)
Creatinine: 0.67 mg/dL (ref 0.44–1.00)
GFR, Estimated: >60 mL/min (ref >60)
Glucose, Bld: 117 mg/dL — ABNORMAL HIGH (ref 70–99)
Potassium: 4.2 mmol/L (ref 3.5–5.1)
Sodium: 139 mmol/L (ref 135–145)
Total Bilirubin: 0.6 mg/dL (ref 0.3–1.2)
Total Protein: 7.1 g/dL (ref 6.5–8.1)

## 2021-03-09 LAB — CBC WITH DIFFERENTIAL (CANCER CENTER ONLY)
Abs Immature Granulocytes: 0.02 10*3/uL (ref 0.00–0.07)
Basophils Absolute: 0 10*3/uL (ref 0.0–0.1)
Basophils Relative: 0 %
Eosinophils Absolute: 0.2 10*3/uL (ref 0.0–0.5)
Eosinophils Relative: 4 %
HCT: 40.1 % (ref 36.0–46.0)
Hemoglobin: 13.3 g/dL (ref 12.0–15.0)
Immature Granulocytes: 0 %
Lymphocytes Relative: 39 %
Lymphs Abs: 2.6 10*3/uL (ref 0.7–4.0)
MCH: 30.6 pg (ref 26.0–34.0)
MCHC: 33.2 g/dL (ref 30.0–36.0)
MCV: 92.2 fL (ref 80.0–100.0)
Monocytes Absolute: 0.4 10*3/uL (ref 0.1–1.0)
Monocytes Relative: 7 %
Neutro Abs: 3.3 10*3/uL (ref 1.7–7.7)
Neutrophils Relative %: 50 %
Platelet Count: 121 10*3/uL — ABNORMAL LOW (ref 150–400)
RBC: 4.35 MIL/uL (ref 3.87–5.11)
RDW: 12.8 % (ref 11.5–15.5)
WBC Count: 6.7 10*3/uL (ref 4.0–10.5)
nRBC: 0 % (ref 0.0–0.2)

## 2021-03-09 LAB — LACTATE DEHYDROGENASE: LDH: 170 U/L (ref 98–192)

## 2021-03-09 NOTE — Progress Notes (Signed)
Hematology and Oncology Follow Up Visit  Crystal Lee [id number] 05-06-1934 87 y.o. 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 Year -- next dose on 11/2021   Interim History:  [name] is here today for follow-up.  Her main problem has been her back.  She has lower back problems.  She says that she saw her neurosurgeon couple months ago.  She said that he did not think he could do anything for her.  She says she has a hard time standing up straight.  This is in the lower back.  It certainly sounds like arthritis.  She sees her family doctor in a couple weeks.  I am sure that she will be able to help with this issue.  We did go ahead and do a CT scan on her today.  There is no evidence of recurrent colon cancer on the CT scan.    Her last CEA level was 1.82.  She has urinary tract infection.  She had a UTI that was diagnosed back in late September.  She was found to have E. coli in the urine.  She has had no bleeding.  There is been no problems with diarrhea.  She does have a large ventral wall hernia.  I told her that she could certainly go back to see the surgeon to see if that needs to be repaired.  I think on her CT scan, this did not show the hernia but there is no loops of bowel in the hernia.  Overall, I would have said that her performance status for now is ECOG 2.    Medications:  Allergies as of 03/09/2021       Reactions   Darvon [propoxyphene] Nausea And Vomiting, Palpitations   Darvocet Causes Sweats   Adhesive [tape] Rash   Morphine And Related Nausea And Vomiting        Medication List        Accurate as of  March 09, 2021  2:59 PM. If you have any questions, ask your nurse or doctor.          amoxicillin-clavulanate 500-125 MG tablet Commonly known as: Augmentin Take 1 tablet (500 mg total) by mouth 2 (two) times daily.   cyclobenzaprine 5 MG tablet Commonly known as: FLEXERIL Take 1 tablet (5 mg total) by mouth at bedtime as needed for muscle spasms.   diphenoxylate-atropine 2.5-0.025 MG tablet Commonly known as: LOMOTIL Take 1 tablet by mouth 4 (four) times daily as needed for diarrhea or loose stools.   estradiol 0.1 MG/GM vaginal cream Commonly known as: ESTRACE VAGINAL Apply 1gm vaginally 1-3x a week as needed for comfort   fenofibrate 48 MG tablet Commonly known as: Tricor Take 1 tablet (48 mg total) by mouth daily.   HYDROcodone-acetaminophen 10-325 MG tablet Commonly known as: NORCO Take 1 tablet by mouth every 6 (six) hours as needed.   ipratropium 0.03 % nasal spray Commonly known as: ATROVENT Place 2 sprays into both nostrils 4 (four) times daily as needed for rhinitis.   loperamide 2 MG tablet Commonly known as: IMODIUM A-D Take 2 mg by mouth 4 (four) times  daily as needed for diarrhea or loose stools.   losartan 25 MG tablet Commonly known as: COZAAR Take 1 tablet (25 mg total) by mouth daily.   Multivitamin Adults Tabs Take 1 tablet by mouth daily.   naloxegol oxalate 12.5 MG Tabs tablet Commonly known as: MOVANTIK Take 1 tablet (12.5 mg total) by mouth daily.   neomycin-polymyxin b-dexamethasone 3.5-10000-0.1 Susp Commonly known as: MAXITROL Place 1 drop into the left eye daily as needed (dye eye).   phenazopyridine 200 MG tablet Commonly known as: Pyridium Take 1 tablet (200 mg total) by mouth 3 (three) times daily as needed for pain.   promethazine 12.5 MG tablet Commonly known as: PHENERGAN Take 1 tablet (12.5 mg total) by mouth every 6 (six) hours as needed for nausea or vomiting.   valACYclovir 1000 MG tablet Commonly known as:  VALTREX Take 1,000 mg by mouth daily.        Allergies:  Allergies  Allergen Reactions   Darvon [Propoxyphene] Nausea And Vomiting and Palpitations    Darvocet Causes Sweats   Adhesive [Tape] Rash   Morphine And Related Nausea And Vomiting    Past Medical History, Surgical history, Social history, and Family History were reviewed and updated.  Review of Systems: Review of Systems  Constitutional: Negative.   HENT: Negative.    Eyes: Negative.   Respiratory: Negative.    Cardiovascular: Negative.   Gastrointestinal: Negative.   Genitourinary: Negative.   Musculoskeletal: Negative.   Skin: Negative.   Neurological: Negative.   Endo/Heme/Allergies: Negative.   Psychiatric/Behavioral: Negative.      Physical Exam:  weight is 207 lb (93.9 kg). Her oral temperature is 98.3 F (36.8 C). Her blood pressure is 137/58 (abnormal) and her pulse is 68. Her respiration is 18 and oxygen saturation is 98%.   Wt Readings from Last 3 Encounters:  03/09/21 207 lb (93.9 kg)  03/05/21 207 lb 6.4 oz (94.1 kg)  02/24/21 207 lb 0.6 oz (93.9 kg)    Physical Exam Vitals reviewed.  HENT:     Head: Normocephalic and atraumatic.  Eyes:     Pupils: Pupils are equal, round, and reactive to light.  Cardiovascular:     Rate and Rhythm: Normal rate and regular rhythm.     Heart sounds: Normal heart sounds.  Pulmonary:     Effort: Pulmonary effort is normal.     Breath sounds: Normal breath sounds.  Abdominal:     General: Bowel sounds are normal.     Palpations: Abdomen is soft.  Musculoskeletal:        General: No tenderness or deformity. Normal range of motion.     Cervical back: Normal range of motion.  Lymphadenopathy:     Cervical: No cervical adenopathy.  Skin:    General: Skin is warm and dry.     Findings: No erythema or rash.  Neurological:     Mental Status: She is alert and oriented to person, place, and time.  Psychiatric:        Behavior: Behavior normal.         Thought Content: Thought content normal.        Judgment: Judgment normal.    Lab Results  Component Value Date   WBC 6.7 03/09/2021   HGB 13.3 03/09/2021   HCT 40.1 03/09/2021   MCV 92.2 03/09/2021   PLT 121 (L) 03/09/2021   Lab Results  Component Value Date   FERRITIN 45 06/09/2020   IRON 49 06/09/2020   TIBC 320  06/09/2020   UIBC 271 06/09/2020   IRONPCTSAT 15 06/09/2020   Lab Results  Component Value Date   RETICCTPCT 1.6 06/09/2020   RBC 4.35 03/09/2021   No results found for: [name], [facility] Lab Results  Component Value Date   IGGSERUM 1,085 02/09/2017   IGMSERUM 336 (H) 02/09/2017   No results found for: [name], SPEI   Chemistry      Component Value Date/Time   NA 139 03/09/2021 1256   NA 139 02/25/2020 1627   K 4.2 03/09/2021 1256   CL 105 03/09/2021 1256   CO2 28 03/09/2021 1256   BUN 13 03/09/2021 1256   BUN 11 02/25/2020 1627   CREATININE 0.67 03/09/2021 1256      Component Value Date/Time   CALCIUM 10.2 03/09/2021 1256   ALKPHOS 76 03/09/2021 1256   AST 20 03/09/2021 1256   ALT 14 03/09/2021 1256   BILITOT 0.6 03/09/2021 1256       Impression and Plan:  Ms. Surprenant is a very pleasant 87 yo caucasian female with history of bilateral stage Ia breast cancer diagnosed back in 1998. She now has locally advanced colon cancer stage IIIb with 3+ lymph nodes.  She completed all of her adjuvant therapy in July.  I really do not think that her colon cancer will come back.  Again, she did have 3 positive lymph nodes so this is always a concern about recurrence.  We will do another CT scan on her in January.  I think this would be reasonable.  Hopefully, her poor back will be able to be looked at and helped.  Again I am not sure what could be done about this if this is arthritis.  [name], MD 10/10/20222:59 PM

## 2021-03-09 NOTE — Telephone Encounter (Signed)
Scheduled appt per 10/10 LOS - mailed letter with appt date and time

## 2021-03-09 NOTE — Progress Notes (Signed)
Oncology Nurse Navigator Documentation  Oncology Nurse Navigator Flowsheets 03/09/2021  Confirmed Diagnosis Date -  Diagnosis Status -  Planned Course of Treatment -  Phase of Treatment -  Chemotherapy Pending- Reason: -  Chemotherapy Actual Start Date: -  Navigator Follow Up Date: 04/30/2021  Navigator Follow Up Reason: Appointment Review  Navigator Location [facility]  Navigator Encounter Type Follow-up Appt;Appt/Treatment Plan Review  Telephone -  Treatment Initiated Date -  Patient Visit Type MedOnc  Treatment Phase Active Tx  Barriers/Navigation Needs Coordination of Care;Education;Anxiety;Transportation  Education -  Interventions Psycho-Social Support  Acuity Level 2-Minimal Needs (1-2 Barriers Identified)  Coordination of Care -  Education Method -  Time Spent with Patient 30

## 2021-03-10 LAB — CEA (IN HOUSE-CHCC): CEA (CHCC-In House): 1.57 ng/mL (ref 0.00–5.00)

## 2021-03-11 ENCOUNTER — Encounter: Payer: Self-pay | Admitting: Registered Nurse

## 2021-03-23 ENCOUNTER — Other Ambulatory Visit (HOSPITAL_COMMUNITY): Payer: Self-pay

## 2021-03-24 NOTE — Patient Instructions (Signed)
It was good to see you again today!   

## 2021-03-24 NOTE — Progress Notes (Signed)
[facility] [address], [place], [place] [address] [phone]  Date:  03/25/2021   Name:  Crystal Lee   DOB:  20-Jul-1933   MRN:  [id number]  PCP:  Darreld Mclean, MD    Chief Complaint: 6 month follow up (Concerns/ questions: 1. Pharmacy changed to [facility]. 2. Insurance says she has to find a new PCP effective 03/23/21. 3. Pt does not think her UTI has cleared up. /Flu shot today: yes/)   History of Present Illness:  Crystal Lee is a 87 y.o. very pleasant female patient who presents with the following:  Patient seen today for periodic follow-up visit History of breast cancer status post bilateral mastectomy, osteoporosis, hypertension, hyperlipidemia, postlaminectomy syndrome and diagnosed with colon cancer this past January    Last seen by myself in April We treated her for possible UTI about 3 weeks ago; urine culture positive for E. Coli She is concerned about possible persistent UTI and would like to do a repeat urine culture.  However, she notes difficulty urinating outside of her home.  I have given her a sterile cup and she will collect a sample at home-bring in tomorrow  She notes that today around 11 am she was working on her computer- all of a sudden she felt like she could not read the words, and then she had a bad headache Visual disturbance lasted for 15- 20 minutes She could see the letters but they were "scrambled" She still has a bad headache-not worst headache of life but significant No other numbness or weakness in her body Her vision now seems normal No slurred speech noted but she was alone at the time.  She did not notice any difficulty swallowing   Her daughter drove her to her appt today    Seen by oncology, Dr. Marin Olp earlier this month: Impression and Plan:  Crystal Lee is a very pleasant 87 yo caucasian female with history of bilateral stage Ia breast cancer diagnosed back in  1998. She now has locally advanced colon cancer stage IIIb with 3+ lymph nodes. She completed all of her adjuvant therapy in July I really do not think that her colon cancer will come back.  Again, she did have 3 positive lymph nodes so this is always a concern about recurrence We will do another CT scan on her in January.  I think this would be reasonable.  She was also seen by PM&R earlier this month for her chronic back pain 1. Chronic Bilateral Leg pain: Paresthesia: Continue to Monitor. 03/05/2021 2. Paresthesia Doreene Burke Radiculitis: Crystal Lee has weaned herself off the Lyrica due to daytime drowsiness. We will continue to monitor. 03/05/2021 3. Pain of Left Wrist/ : No complaints today. S/P Carpal Tunnel Release on 06/03/2017 by Dr. Ellene Route. Dr. Ellene Route Following. 03/05/2021. 4. Fracture of superior pubic ramus.  Dr. Wynelle Link Following.  Continue to monitor. 03/05/2021. 5. Bilateral Knee OA: Continue Voltaren Gel.  Continue to monitor. Orthopedist following. 03/05/2021 6. Polyarthralgia: Continue to alternate with heat and ice therapy. Continue current medication regime. Continue to monitor. 03/05/2021. 7. Chronic Pain Syndrome: Refilled::Hydrocodone 10/325 mg one tablet every 6 hours as needed for moderate pain #120. 03/05/2021. 8. Lumbar Compression Fracture L2 and L3:  Crystal Lee refused physical therapy. Continue with rest/ heat therapy. Continue to Monitor 03/05/2021. 9. Muscle Spasm: Continue  Flexeril 5 mg at HS. Continue to monitor. 03/05/2021. 10. Right Shoulder Tendonitis: No complaints  today. Continue HEP as Tolerated. Alternate Ice and Heat Therapy. 03/05/2021    Shingles vaccine COVID new booster Flu shot Lab work done October 10 Patient Active Problem List   Diagnosis Date Noted   Sleep apnea    Rosacea    PONV (postoperative nausea and vomiting)    Nocturia    Neuropathy    Melanoma ([place])    Hypertension    Hyperlipidemia    Fractured hip ([place])    Difficulty  sleeping    Complication of anesthesia    Cancer ([place])    Arthritis    Malignant neoplasm of descending colon ([place])    Gastric polyps    Colonic mass    Diverticulosis of colon without hemorrhage    Adenomatous polyp of sigmoid colon    Acute lower GI bleeding 06/09/2020   Lower GI bleed 06/08/2020   Acute blood loss anemia 06/08/2020   HTN (hypertension) 06/08/2020   Constipation    Encounter for orthopedic follow-up care 06/18/2019   Urinary tract infection 04/20/2019   Acute pain of right wrist 03/28/2019   Tenosynovitis, wrist 03/28/2019   Acquired trigger finger of right little finger 01/15/2019   Carpal tunnel syndrome of right wrist 01/15/2019   Aftercare 08/17/2018   Lumbar post-laminectomy syndrome 07/31/2018   Heartburn    Fracture of superior pubic ramus ([place]) 06/16/2018   Radial styloid tenosynovitis of left hand 05/16/2018   Pain of left hand 04/20/2018   Osteopenia 04/15/2018   Dyslipidemia 02/02/2018   GERD (gastroesophageal reflux disease) 02/02/2018   Trochanteric bursitis of left hip 12/23/2017   Primary osteoarthritis of left knee 10/17/2017   History of right knee joint replacement 10/17/2017   Pain in both lower extremities 08/02/2017   Stage 1 breast cancer, ER+, right ([place]) 07/04/2017   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   Pain in right hand 06/10/2017   Trigger finger of right hand 06/10/2017   Paresthesia 02/09/2017   Low back pain 02/09/2017   Gait abnormality 02/09/2017   OA (osteoarthritis) of hip 05/09/2015    Past Medical History:  Diagnosis Date   Arthritis    Cancer ([place])    HX BREAST CANCER/ SKIN CANCER   Complication of anesthesia    N/V WITH MORPHINE   Difficulty sleeping    Fractured hip ([place])    LEFT - AUG 2016   GERD (gastroesophageal reflux disease)    Hyperlipidemia    Hypertension    Melanoma ([place])    Neuropathy    Nocturia    Osteopenia    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   PONV  (postoperative nausea and vomiting)    PT STATES MORPHINE CAUSED N/V   Rosacea    Sleep apnea    Stage 1 breast cancer, ER+, right ([place]) 07/04/2017    Past Surgical History:  Procedure Laterality Date   38 HOUR [place] STUDY N/A 06/21/2018   Procedure: 24 HOUR [place] STUDY;  Surgeon: Lavena Bullion, DO;  Location: [facility];  Service: Gastroenterology;  Laterality: N/A;  with impedance   ABDOMINAL HYSTERECTOMY  2010   ANAL RECTAL MANOMETRY N/A 09/19/2019   Procedure: ANO RECTAL MANOMETRY;  Surgeon: Mauri Pole, MD;  Location: [facility];  Service: Endoscopy;  Laterality: N/A;   [place]   BIOPSY  06/10/2020   Procedure: BIOPSY;  Surgeon: Lavena Bullion, DO;  Location: [facility];  Service: Gastroenterology;;  EGD and COLON   BREAST SURGERY  CATARACT EXTRACTION Bilateral 2011   CHOLECYSTECTOMY     COLON RESECTION N/A 06/12/2020   Procedure: LAPAROSCOPIC COLON RESECTION;  Surgeon: [name], MD;  Location: [facility];  Service: General;  Laterality: N/A;   COLONOSCOPY WITH PROPOFOL N/A 06/10/2020   Procedure: COLONOSCOPY WITH PROPOFOL;  Surgeon: Lavena Bullion, DO;  Location: [facility];  Service: Gastroenterology;  Laterality: N/A;   ESOPHAGEAL MANOMETRY N/A 06/21/2018   Procedure: ESOPHAGEAL MANOMETRY (EM);  Surgeon: Lavena Bullion, DO;  [facility]: [facility];  Service: Gastroenterology;  Laterality: N/A;   ESOPHAGOGASTRODUODENOSCOPY (EGD) WITH PROPOFOL N/A 06/10/2020   Procedure: ESOPHAGOGASTRODUODENOSCOPY (EGD) WITH PROPOFOL;  Surgeon: Lavena Bullion, DO;  [facility];  Service: Gastroenterology;  Laterality: N/A;   GALLBLADDER SURGERY  2015   JOINT REPLACEMENT     RT TOTAL HIP / RT TOTAL KNEE   MASTECTOMY  1998   BILATERAL    [place] IMPEDANCE STUDY  06/21/2018   Procedure: [place] IMPEDANCE STUDY;  Surgeon: Lavena Bullion, DO;  Location: [facility];  Service: Gastroenterology;;   POLYPECTOMY  06/10/2020    Procedure: POLYPECTOMY;  Surgeon: Lavena Bullion, DO;  [facility];  Service: Gastroenterology;;   SKIN CANCER EXCISION  2016   RT SIDE OF NOSE   SUBMUCOSAL TATTOO INJECTION  06/10/2020   Procedure: SUBMUCOSAL TATTOO INJECTION;  Surgeon: Lavena Bullion, DO;  Location: [facility];  Service: Gastroenterology;;   [place]  2010   RIGHT   TOTAL HIP ARTHROPLASTY Left 05/09/2015   Procedure: LEFT TOTAL HIP ARTHROPLASTY ANTERIOR APPROACH;  Surgeon: Gaynelle Arabian, MD;  Location: [facility];  Service: Orthopedics;  Laterality: Left;   TOTAL KNEE ARTHROPLASTY  2001   TUMOR REMOVAL  2012   ABDOMINAL - NON CANCEROUS    Social History   Tobacco Use   Smoking status: Former    Packs/day: 0.50    Years: 20.00    Pack years: 10.00    Types: Cigarettes    Quit date: 02/03/1976    Years since quitting: 45.1   Smokeless tobacco: Never  Vaping Use   Vaping Use: Never used  Substance Use Topics   Alcohol use: No   Drug use: No    Family History  Problem Relation Age of Onset   Other Mother        complications from flu   Other Father        unsure of cause   Colon cancer Neg Hx     Allergies  Allergen Reactions   Darvon [Propoxyphene] Nausea And Vomiting and Palpitations    Darvocet Causes Sweats   Adhesive [Tape] Rash   Morphine And Related Nausea And Vomiting    Medication list has been reviewed and updated.  Current Outpatient Medications on File Prior to Visit  Medication Sig Dispense Refill   amoxicillin-clavulanate (AUGMENTIN) 500-125 MG tablet Take 1 tablet (500 mg total) by mouth 2 (two) times daily. 14 tablet 0   cyclobenzaprine (FLEXERIL) 5 MG tablet Take 1 tablet (5 mg total) by mouth at bedtime as needed for muscle spasms. 90 tablet 0   diphenoxylate-atropine (LOMOTIL) 2.5-0.025 MG tablet Take 1 tablet by mouth 4 (four) times daily as needed for diarrhea or loose stools. 30 tablet 0   estradiol (ESTRACE VAGINAL) 0.1  MG/GM vaginal cream Apply 1gm vaginally 1-3x a week as needed for comfort 42.5 g 12   fenofibrate (TRICOR) 48 MG tablet Take 1 tablet (48 mg total) by mouth daily.  90 tablet 3   HYDROcodone-acetaminophen (NORCO) 10-325 MG tablet Take 1 tablet by mouth every 6 (six) hours as needed. 120 tablet 0   ipratropium (ATROVENT) 0.03 % nasal spray Place 2 sprays into both nostrils 4 (four) times daily as needed for rhinitis. 30 mL 12   loperamide (IMODIUM A-D) 2 MG tablet Take 2 mg by mouth 4 (four) times daily as needed for diarrhea or loose stools.     losartan (COZAAR) 25 MG tablet Take 1 tablet (25 mg total) by mouth daily. 90 tablet 3   Multiple Vitamins-Minerals (MULTIVITAMIN ADULTS) TABS Take 1 tablet by mouth daily.     naloxegol oxalate (MOVANTIK) 12.5 MG TABS tablet Take 1 tablet (12.5 mg total) by mouth daily. 90 tablet 3   neomycin-polymyxin b-dexamethasone (MAXITROL) 3.5-10000-0.1 SUSP Place 1 drop into the left eye daily as needed (dye eye).     phenazopyridine (PYRIDIUM) 200 MG tablet Take 1 tablet (200 mg total) by mouth 3 (three) times daily as needed for pain. 40 tablet 0   promethazine (PHENERGAN) 12.5 MG tablet Take 1 tablet (12.5 mg total) by mouth every 6 (six) hours as needed for nausea or vomiting. 90 tablet 3   valACYclovir (VALTREX) 1000 MG tablet Take 1,000 mg by mouth daily.     No current facility-administered medications on file prior to visit.    Review of Systems:  As per HPI- otherwise negative.   Physical Examination: Vitals:   03/25/21 1318  BP: 136/70  Pulse: 71  Resp: 18  Temp: 98.1 F (36.7 C)  SpO2: 98%   Vitals:   03/25/21 1318  Weight: 208 lb 12.8 oz (94.7 kg)  Height: 5\' 6"  (1.676 m)   Body mass index is 33.7 kg/m. Ideal Body Weight: Weight in (lb) to have BMI = 25: 154.6  GEN: no acute distress. Overweight, looks well and her normal self  HEENT: Atraumatic, Normocephalic.  Ears and Nose: No external deformity. CV: RRR, No M/G/R. No JVD. No  thrill. No extra heart sounds. PULM: CTA B, no wheezes, crackles, rhonchi. No retractions. No resp. distress. No accessory muscle use. ABD: S, NT, +BS. No rebound. No HSM.  Belly is distended per her baseline - henia resulting from colon resection January of this year  EXTR: No c/c/e PSYCH: Normally interactive. Conversant.  She is able to stand and walk slowly.  4/5 strength all extremities, equal bilaterally Normal facial movement.  Equal sensation of all extremities   Assessment and Plan: Visual disturbance  Frequent UTI - Plan: Ambulatory referral to Urogynecology, Urine Culture, CANCELED: Urine Culture  Dyslipidemia - Plan: Lipid panel  Acute nonintractable headache, unspecified headache type  Patient seen today for scheduled follow-up visit.  However, she notes an episode of vision change and severe headache which occurred about 2 hours prior to her appointment.  Vision change lasted 15 to 20 minutes, her headache is still persistent although not as severe.  She does have history of known metastatic colon cancer.  Discussed the situation in detail with patient and her daughter.  We are concerned about potentially serious cause of her symptoms including TIA, cancer mets, aneurysm.  Would like to have her seen in the emergency room for intracranial imaging.  Patient and family are aware that MRI is not available here at the med center and that transfer may be necessary.  However, they would like to start here as they feel most comfortable at this location.  The patient is not able to drive, her daughter  will provide her transportation home after evaluation  I am more than happy to help arrange any outpatient MRI scanning which neurology may feel necessary  Signed Lamar Blinks, MD  11 am lab appt tomorrow for urine culture and cholesterol We will also plan to get her flu shot tomorrow assuming she is discharged home

## 2021-03-25 ENCOUNTER — Ambulatory Visit (INDEPENDENT_AMBULATORY_CARE_PROVIDER_SITE_OTHER): Payer: Medicare Other | Admitting: Family Medicine

## 2021-03-25 ENCOUNTER — Telehealth: Payer: Self-pay | Admitting: Family Medicine

## 2021-03-25 ENCOUNTER — Observation Stay (HOSPITAL_BASED_OUTPATIENT_CLINIC_OR_DEPARTMENT_OTHER)
Admission: EM | Admit: 2021-03-25 | Discharge: 2021-03-27 | Disposition: A | Discharge status: Home / Self Care | Payer: Medicare Other | Attending: Internal Medicine | Admitting: Internal Medicine

## 2021-03-25 ENCOUNTER — Other Ambulatory Visit: Payer: Self-pay

## 2021-03-25 ENCOUNTER — Emergency Department (HOSPITAL_BASED_OUTPATIENT_CLINIC_OR_DEPARTMENT_OTHER): Payer: Medicare Other

## 2021-03-25 ENCOUNTER — Encounter (HOSPITAL_BASED_OUTPATIENT_CLINIC_OR_DEPARTMENT_OTHER): Payer: Self-pay | Admitting: Emergency Medicine

## 2021-03-25 VITALS — BP 136/70 | HR 71 | Temp 98.1°F | Resp 18 | Ht 66.0 in | Wt 208.8 lb

## 2021-03-25 DIAGNOSIS — Z85038 Personal history of other malignant neoplasm of large intestine: Secondary | ICD-10-CM | POA: Insufficient documentation

## 2021-03-25 DIAGNOSIS — R531 Weakness: Principal | ICD-10-CM | POA: Insufficient documentation

## 2021-03-25 DIAGNOSIS — N3000 Acute cystitis without hematuria: Secondary | ICD-10-CM | POA: Diagnosis not present

## 2021-03-25 DIAGNOSIS — R29810 Facial weakness: Secondary | ICD-10-CM | POA: Diagnosis not present

## 2021-03-25 DIAGNOSIS — Z79899 Other long term (current) drug therapy: Secondary | ICD-10-CM | POA: Insufficient documentation

## 2021-03-25 DIAGNOSIS — Z96651 Presence of right artificial knee joint: Secondary | ICD-10-CM | POA: Insufficient documentation

## 2021-03-25 DIAGNOSIS — I10 Essential (primary) hypertension: Secondary | ICD-10-CM | POA: Insufficient documentation

## 2021-03-25 DIAGNOSIS — Z7982 Long term (current) use of aspirin: Secondary | ICD-10-CM | POA: Diagnosis not present

## 2021-03-25 DIAGNOSIS — N390 Urinary tract infection, site not specified: Secondary | ICD-10-CM

## 2021-03-25 DIAGNOSIS — R4781 Slurred speech: Secondary | ICD-10-CM | POA: Diagnosis not present

## 2021-03-25 DIAGNOSIS — Z853 Personal history of malignant neoplasm of breast: Secondary | ICD-10-CM | POA: Diagnosis not present

## 2021-03-25 DIAGNOSIS — H538 Other visual disturbances: Secondary | ICD-10-CM | POA: Diagnosis not present

## 2021-03-25 DIAGNOSIS — R300 Dysuria: Secondary | ICD-10-CM

## 2021-03-25 DIAGNOSIS — E785 Hyperlipidemia, unspecified: Secondary | ICD-10-CM | POA: Diagnosis not present

## 2021-03-25 DIAGNOSIS — C186 Malignant neoplasm of descending colon: Secondary | ICD-10-CM | POA: Diagnosis present

## 2021-03-25 DIAGNOSIS — Z20822 Contact with and (suspected) exposure to covid-19: Secondary | ICD-10-CM | POA: Insufficient documentation

## 2021-03-25 DIAGNOSIS — H539 Unspecified visual disturbance: Secondary | ICD-10-CM | POA: Diagnosis not present

## 2021-03-25 DIAGNOSIS — Z96693 Finger-joint replacement, bilateral: Secondary | ICD-10-CM | POA: Diagnosis not present

## 2021-03-25 DIAGNOSIS — R519 Headache, unspecified: Secondary | ICD-10-CM

## 2021-03-25 DIAGNOSIS — R2990 Unspecified symptoms and signs involving the nervous system: Secondary | ICD-10-CM

## 2021-03-25 DIAGNOSIS — Z85828 Personal history of other malignant neoplasm of skin: Secondary | ICD-10-CM | POA: Diagnosis not present

## 2021-03-25 DIAGNOSIS — Z87891 Personal history of nicotine dependence: Secondary | ICD-10-CM | POA: Insufficient documentation

## 2021-03-25 DIAGNOSIS — R000 Tachycardia, unspecified: Secondary | ICD-10-CM | POA: Diagnosis not present

## 2021-03-25 DIAGNOSIS — G459 Transient cerebral ischemic attack, unspecified: Secondary | ICD-10-CM | POA: Diagnosis not present

## 2021-03-25 HISTORY — DX: Unspecified symptoms and signs involving the nervous system: R29.90

## 2021-03-25 LAB — RAPID URINE DRUG SCREEN, HOSP PERFORMED
Amphetamines: NOT DETECTED
Barbiturates: NOT DETECTED
Benzodiazepines: NOT DETECTED
Cocaine: NOT DETECTED
Opiates: POSITIVE — AB
Tetrahydrocannabinol: NOT DETECTED

## 2021-03-25 LAB — COMPREHENSIVE METABOLIC PANEL
ALT: 18 U/L (ref 0–44)
AST: 23 U/L (ref 15–41)
Albumin: 4.1 g/dL (ref 3.5–5.0)
Alkaline Phosphatase: 87 U/L (ref 38–126)
Anion gap: 8 (ref 5–15)
BUN: 14 mg/dL (ref 8–23)
CO2: 25 mmol/L (ref 22–32)
Calcium: 9.4 mg/dL (ref 8.9–10.3)
Chloride: 103 mmol/L (ref 98–111)
Creatinine, Ser: 0.59 mg/dL (ref 0.44–1.00)
GFR, Estimated: >60 mL/min (ref >60)
Glucose, Bld: 103 mg/dL — ABNORMAL HIGH (ref 70–99)
Potassium: 3.8 mmol/L (ref 3.5–5.1)
Sodium: 136 mmol/L (ref 135–145)
Total Bilirubin: 0.4 mg/dL (ref 0.3–1.2)
Total Protein: 7.9 g/dL (ref 6.5–8.1)

## 2021-03-25 LAB — URINALYSIS, ROUTINE W REFLEX MICROSCOPIC
Bilirubin Urine: NEGATIVE
Glucose, UA: NEGATIVE mg/dL
Ketones, ur: NEGATIVE mg/dL
Nitrite: POSITIVE — AB
Protein, ur: NEGATIVE mg/dL
Specific Gravity, Urine: 1.015 (ref 1.005–1.030)
pH: 6 (ref 5.0–8.0)

## 2021-03-25 LAB — CBC
HCT: 41 % (ref 36.0–46.0)
Hemoglobin: 13.4 g/dL (ref 12.0–15.0)
MCH: 29.8 pg (ref 26.0–34.0)
MCHC: 32.7 g/dL (ref 30.0–36.0)
MCV: 91.1 fL (ref 80.0–100.0)
Platelets: 141 10*3/uL — ABNORMAL LOW (ref 150–400)
RBC: 4.5 MIL/uL (ref 3.87–5.11)
RDW: 12.7 % (ref 11.5–15.5)
WBC: 6.8 10*3/uL (ref 4.0–10.5)
nRBC: 0 % (ref 0.0–0.2)

## 2021-03-25 LAB — URINALYSIS, MICROSCOPIC (REFLEX)

## 2021-03-25 LAB — PROTIME-INR
INR: 1 (ref 0.8–1.2)
Prothrombin Time: 12.7 seconds (ref 11.4–15.2)

## 2021-03-25 LAB — DIFFERENTIAL
Abs Immature Granulocytes: 0.05 10*3/uL (ref 0.00–0.07)
Basophils Absolute: 0 10*3/uL (ref 0.0–0.1)
Basophils Relative: 1 %
Eosinophils Absolute: 0.2 10*3/uL (ref 0.0–0.5)
Eosinophils Relative: 3 %
Immature Granulocytes: 1 %
Lymphocytes Relative: 32 %
Lymphs Abs: 2.2 10*3/uL (ref 0.7–4.0)
Monocytes Absolute: 0.5 10*3/uL (ref 0.1–1.0)
Monocytes Relative: 7 %
Neutro Abs: 3.8 10*3/uL (ref 1.7–7.7)
Neutrophils Relative %: 56 %

## 2021-03-25 LAB — RESP PANEL BY RT-PCR (FLU A&B, COVID) ARPGX2
Influenza A by PCR: NEGATIVE
Influenza B by PCR: NEGATIVE
SARS Coronavirus 2 by RT PCR: NEGATIVE

## 2021-03-25 LAB — APTT: aPTT: 30 seconds (ref 24–36)

## 2021-03-25 LAB — ETHANOL: Alcohol, Ethyl (B): <10 mg/dL (ref <10)

## 2021-03-25 LAB — CBG MONITORING, ED: Glucose-Capillary: 95 mg/dL (ref 70–99)

## 2021-03-25 MED ORDER — FENTANYL CITRATE PF 50 MCG/ML IJ SOSY
50.0000 ug | PREFILLED_SYRINGE | Freq: Once | INTRAMUSCULAR | Status: AC
  Administered 2021-03-25: 50 ug via INTRAVENOUS
  Filled 2021-03-25: qty 1

## 2021-03-25 MED ORDER — SODIUM CHLORIDE 0.9 % IV SOLN
1.0000 g | Freq: Once | INTRAVENOUS | Status: AC
  Administered 2021-03-25: 1 g via INTRAVENOUS
  Filled 2021-03-25: qty 10

## 2021-03-25 MED ORDER — HYDROCODONE-ACETAMINOPHEN 5-325 MG PO TABS
2.0000 | ORAL_TABLET | Freq: Once | ORAL | Status: AC
  Administered 2021-03-25: 2 via ORAL
  Filled 2021-03-25: qty 2

## 2021-03-25 NOTE — Telephone Encounter (Signed)
Called and spoke with her daughter Almyra Free.  Explained that her mom is being treated with Rocephin currently, urine culture is pending.  We will hold off on calling in additional treatment until we get her culture back

## 2021-03-25 NOTE — ED Notes (Signed)
Transported by [employer] 

## 2021-03-25 NOTE — Telephone Encounter (Signed)
Pt's daughter came to office to inform pt was seen at the ER and that pt was going to be admitted to the hospital to get her MRI done -pt's daughter states that it is for pt's safety due to wanting the hospital for pt to be observed. Pt did do urine lab work at ER and pt is wanting to know if provider is going to send meds for her UTI to please send meds to [facility] [place]. Please advise.

## 2021-03-25 NOTE — ED Notes (Signed)
FSBS 95

## 2021-03-25 NOTE — Progress Notes (Signed)
Received a phone call from Facility: [facility]  Requesting MD: Armandina Gemma Patient with h/o breast cancer (remote); HTN; HLD; OSA presenting with stroke-like symptoms.  Blurred vision, recurrent UTI symptoms.  PCP sent in, R facial droop.  CT negative.  Urine culture sensitive to Rocephin so given that. Plan of care: Observation for TIA/CVA, treatment of UTI. The patient will be accepted for admission to telemetry at [facility] when bed is available.    Nursing staff, Please call the [place] number at the top of Amion at the time of the patient's arrival so that the patient can be paged to the admitting physician.   Carlyon Shadow, M.D. [facility]

## 2021-03-25 NOTE — Telephone Encounter (Signed)
See below

## 2021-03-25 NOTE — ED Notes (Signed)
Report called to 2W

## 2021-03-25 NOTE — ED Triage Notes (Signed)
Headache, slurred speech, blurry vision onset 11am today.

## 2021-03-25 NOTE — ED Notes (Signed)
Lab informed urine Cx add on.

## 2021-03-25 NOTE — ED Provider Notes (Signed)
[facility] EMERGENCY DEPARTMENT Provider Note   CSN: [id number] Arrival date & time: 03/25/21  1355     History Chief Complaint  Patient presents with   Code Stroke    Crystal Lee is a 87 y.o. female with past medical history significant for metastatic cancer, hypertension, hyperlipidemia, chronic back pain, recent UTI who presents for evaluation of CODE STROKE.  Code stroke called on initial provider evaluation.  LKN 11 AM  Apparently was doing well until this morning.  Patient states she noted blurred vision in bilateral eyes, felt like the words were "scrambled" on the TV.  Noted to have some garbled speech as well as headache.  States she feels "weird" in the head.  Does not typically get headaches.  Denies any sudden onset thunderclap headache.  She has some generalized weakness however feels weaker on the right.  Recently complete antibiotics for UTI.  Was at PCP today for recheck of this as she is still having dysuria.  Unsure what meds previously taken. (Augmentin per chart review)  No fever, neck pain, neck stiffness, chest pain, abdominal pain, nausea, vomiting.  She walks with walker at baseline.  Rates her headache a 8/10.  Not taken anything for this.  Takes baby aspirin daily.   Of note had a scan 1 month ago which not show any new or progressive findings to suggest recurrent metastatic cancer. No current chemo, radiation.  No hx of HA, anticoagulation, falls. Hx of metastatic CA.  Seen by PCP today and sent down for further evaluation.  Denies additional aggrieving or alleviating factors.  History obtained from patient, daughter in room, past medical records.  No interpreter used.  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) 07/04/2017    Patient Active Problem List   Diagnosis Date Noted   Sleep apnea    Rosacea    PONV (postoperative nausea and vomiting)    Nocturia    Neuropathy    Melanoma ([place])    Hypertension    Hyperlipidemia    Fractured hip ([facility])    Difficulty sleeping    Complication of anesthesia    Cancer ([place])    Arthritis    Malignant neoplasm of descending colon ([place])    Gastric polyps    Colonic mass    Diverticulosis of colon without hemorrhage    Adenomatous polyp of sigmoid colon    Acute lower GI bleeding 06/09/2020   Lower GI bleed 06/08/2020   Acute blood loss anemia 06/08/2020   HTN (hypertension) 06/08/2020   Constipation    Encounter for orthopedic follow-up care 06/18/2019   Urinary tract infection 04/20/2019   Acute pain of right wrist 03/28/2019   Tenosynovitis, wrist 03/28/2019   Acquired trigger finger of right little finger 01/15/2019   Carpal tunnel syndrome  of right wrist 01/15/2019   Aftercare 08/17/2018   Lumbar post-laminectomy syndrome 07/31/2018   Heartburn    Fracture of superior pubic ramus ([place]) 06/16/2018   Radial styloid tenosynovitis of left hand 05/16/2018   Pain of left hand 04/20/2018   Osteopenia 04/15/2018   Dyslipidemia 02/02/2018   GERD (gastroesophageal reflux disease) 02/02/2018   Trochanteric bursitis of left hip 12/23/2017   Primary osteoarthritis of left knee 10/17/2017   History of right knee joint replacement 10/17/2017   Pain in both lower extremities 08/02/2017   Stage 1 breast cancer, ER+, right ([place]) 07/04/2017   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   Pain in right hand 06/10/2017   Trigger finger of right hand  06/10/2017   Paresthesia 02/09/2017   Low back pain 02/09/2017   Gait abnormality 02/09/2017   OA (osteoarthritis) of hip 05/09/2015    Past Surgical History:  Procedure Laterality Date   46 HOUR [place] STUDY N/A 06/21/2018   Procedure: 24 HOUR [place] STUDY;  Surgeon: Lavena Bullion, DO;  Location: [facility];  Service: Gastroenterology;  Laterality: N/A;  with impedance   ABDOMINAL HYSTERECTOMY  2010   ANAL RECTAL MANOMETRY N/A 09/19/2019   Procedure: ANO RECTAL MANOMETRY;  Surgeon: Mauri Pole, MD;  Location: [facility];  Service: Endoscopy;  Laterality: N/A;   [place]   BIOPSY  06/10/2020   Procedure: BIOPSY;  Surgeon: Lavena Bullion, DO;  Location: [facility];  Service: Gastroenterology;;  EGD and COLON   BREAST SURGERY     CATARACT EXTRACTION Bilateral 2011   CHOLECYSTECTOMY     COLON RESECTION N/A 06/12/2020   Procedure: LAPAROSCOPIC COLON RESECTION;  Surgeon: [name], MD;  Location: [facility];  Service: General;  Laterality: N/A;   COLONOSCOPY WITH PROPOFOL N/A 06/10/2020   Procedure: COLONOSCOPY WITH PROPOFOL;  Surgeon: Lavena Bullion, DO;  Location: [facility];  Service: Gastroenterology;  Laterality: N/A;   ESOPHAGEAL MANOMETRY N/A 06/21/2018   Procedure: ESOPHAGEAL MANOMETRY (EM);  Surgeon: Lavena Bullion, DO;  [facility];  Service: Gastroenterology;  Laterality: N/A;   ESOPHAGOGASTRODUODENOSCOPY (EGD) WITH PROPOFOL N/A 06/10/2020   Procedure: ESOPHAGOGASTRODUODENOSCOPY (EGD) WITH PROPOFOL;  Surgeon: Lavena Bullion, DO;  [facility];  Service: Gastroenterology;  Laterality: N/A;   GALLBLADDER SURGERY  2015   JOINT REPLACEMENT     RT TOTAL HIP / RT TOTAL KNEE   MASTECTOMY  1998   BILATERAL    [place] IMPEDANCE STUDY  06/21/2018   Procedure: [place] IMPEDANCE STUDY;  Surgeon: Lavena Bullion, DO;  [facility]: [facility];  Service: Gastroenterology;;   POLYPECTOMY  06/10/2020   Procedure:  POLYPECTOMY;  Surgeon: Lavena Bullion, DO;  [facility];  Service: Gastroenterology;;   SKIN CANCER EXCISION  2016   RT SIDE OF NOSE   SUBMUCOSAL TATTOO INJECTION  06/10/2020   Procedure: SUBMUCOSAL TATTOO INJECTION;  Surgeon: Lavena Bullion, DO;  [facility];  Service: Gastroenterology;;   [place]  2010   RIGHT   TOTAL HIP ARTHROPLASTY Left 05/09/2015   Procedure: LEFT TOTAL HIP ARTHROPLASTY ANTERIOR APPROACH;  Surgeon: Gaynelle Arabian, MD;  Location: [facility];  Service: Orthopedics;  Laterality: Left;   TOTAL KNEE ARTHROPLASTY  2001   TUMOR REMOVAL  2012   ABDOMINAL - NON CANCEROUS     OB History   No obstetric history on file.     Family History  Problem Relation  Age of Onset   Other Mother        complications from flu   Other Father        unsure of cause   Colon cancer Neg Hx     Social History   Tobacco Use   Smoking status: Former    Packs/day: 0.50    Years: 20.00    Pack years: 10.00    Types: Cigarettes    Quit date: 02/03/1976    Years since quitting: 45.1   Smokeless tobacco: Never  Vaping Use   Vaping Use: Never used  Substance Use Topics   Alcohol use: No   Drug use: No    Home Medications Prior to Admission medications   Medication Sig Start Date End Date Taking? Authorizing Provider  amoxicillin-clavulanate (AUGMENTIN) 500-125 MG tablet Take 1 tablet (500 mg total) by mouth 2 (two) times daily. 02/26/21   Copland, Gay Filler, MD  cyclobenzaprine (FLEXERIL) 5 MG tablet Take 1 tablet (5 mg total) by mouth at bedtime as needed for muscle spasms. 11/11/20   Bayard Hugger, NP  diphenoxylate-atropine (LOMOTIL) 2.5-0.025 MG tablet Take 1 tablet by mouth 4 (four) times daily as needed for diarrhea or loose stools. 10/16/20   Celso Amy, NP  estradiol (ESTRACE VAGINAL) 0.1 MG/GM vaginal cream Apply 1gm vaginally 1-3x a week as needed for comfort 09/22/20   Copland, Gay Filler, MD  fenofibrate  (TRICOR) 48 MG tablet Take 1 tablet (48 mg total) by mouth daily. 09/22/20   Copland, Gay Filler, MD  HYDROcodone-acetaminophen (NORCO) 10-325 MG tablet Take 1 tablet by mouth every 6 (six) hours as needed. 03/05/21   Bayard Hugger, NP  ipratropium (ATROVENT) 0.03 % nasal spray Place 2 sprays into both nostrils 4 (four) times daily as needed for rhinitis. 09/22/20   Copland, Gay Filler, MD  loperamide (IMODIUM A-D) 2 MG tablet Take 2 mg by mouth 4 (four) times daily as needed for diarrhea or loose stools. 10/12/20   [provider]  losartan (COZAAR) 25 MG tablet Take 1 tablet (25 mg total) by mouth daily. 09/22/20   Copland, Gay Filler, MD  Multiple Vitamins-Minerals (MULTIVITAMIN ADULTS) TABS Take 1 tablet by mouth daily.    [provider]  naloxegol oxalate (MOVANTIK) 12.5 MG TABS tablet Take 1 tablet (12.5 mg total) by mouth daily. 07/07/20   Cirigliano, Vito V, DO  neomycin-polymyxin b-dexamethasone (MAXITROL) 3.5-10000-0.1 SUSP Place 1 drop into the left eye daily as needed (dye eye). 09/26/20   [provider]  phenazopyridine (PYRIDIUM) 200 MG tablet Take 1 tablet (200 mg total) by mouth 3 (three) times daily as needed for pain. 01/07/21   Copland, Gay Filler, MD  promethazine (PHENERGAN) 12.5 MG tablet Take 1 tablet (12.5 mg total) by mouth every 6 (six) hours as needed for nausea or vomiting. 07/07/20   Cirigliano, Vito V, DO  valACYclovir (VALTREX) 1000 MG tablet Take 1,000 mg by mouth daily. 10/03/20   [provider]    Allergies    Darvon [propoxyphene], Adhesive [tape], and Morphine and related  Review of Systems   Review of Systems  Unable to perform ROS: Acuity of condition  Constitutional: Negative.   HENT: Negative.    Respiratory: Negative.    Cardiovascular: Negative.   Gastrointestinal: Negative.   Genitourinary: Negative.   Musculoskeletal: Negative.   Skin: Negative.   Neurological:  Positive for facial asymmetry, speech difficulty, weakness  and headaches. Negative for tremors, syncope and numbness.  All other systems  reviewed and are negative.  Physical Exam Updated Vital Signs BP (!) 141/124   Pulse 72   Resp 15   SpO2 98%   Physical Exam Physical Exam  Constitutional: Pt is oriented to person, place, and time. Pt appears well-developed and well-nourished. No distress.  HENT:  Head: Normocephalic and atraumatic.  Mouth/Throat: Oropharynx is clear and moist.  Eyes: Conjunctivae and EOM are normal. Pupils are equal, round, and reactive to light. No scleral icterus.  No horizontal, vertical or rotational nystagmus  Neck: Normal range of motion. Neck supple.  Full active and passive ROM without pain No midline or paraspinal tenderness No nuchal rigidity or meningeal signs  Cardiovascular: Normal rate, regular rhythm and intact distal pulses.   Pulmonary/Chest: Effort normal and breath sounds normal. No respiratory distress. Pt has no wheezes. No rales.  Abdominal: Soft. Bowel sounds are normal. Distension present, non tender. Hernia present. Musculoskeletal: Normal range of motion. No bony tenderness. Lower extremity edema to mid shin Bl. Lymphadenopathy:    No cervical adenopathy.  Neurological: Pt. is alert and oriented to person, place, and time. He has normal reflexes. No cranial nerve deficit.  Exhibits normal muscle tone. Coordination normal.  Mental Status:  Alert, oriented, thought content appropriate. Speech fluent without evidence of aphasia. Able to follow 2 step commands without difficulty.  Cranial Nerves:  II:  Peripheral visual fields grossly normal, pupils equal, round, reactive to light III,IV, VI: ptosis not present, extra-ocular motions intact bilaterally  V,VII: Slight right facial droop, facial light touch sensation equal VIII: hearing grossly normal bilaterally  IX,X: midline uvula rise  XI: bilateral shoulder shrug equal and strong XII: midline tongue extension  Motor:  Slight decreased  strength in right hand grip. 5/5 on left Sensory: Normal sensation in all extremities.  Cerebellar: normal finger-to-nose with bilateral upper extremities Gait: normal gait (cane)and balance CV: distal pulses palpable throughout   Skin: Skin is warm and dry. No rash noted. Pt is not diaphoretic.  Psychiatric: Pt has a normal mood and affect. Behavior is normal. Judgment and thought content normal.  Nursing note and vitals reviewed.  ED Results / Procedures / Treatments   Labs (all labs ordered are listed, but only abnormal results are displayed) Labs Reviewed  CBC - Abnormal; Notable for the following components:      Result Value   Platelets 141 (*)    All other components within normal limits  COMPREHENSIVE METABOLIC PANEL - Abnormal; Notable for the following components:   Glucose, Bld 103 (*)    All other components within normal limits  RAPID URINE DRUG SCREEN, HOSP PERFORMED - Abnormal; Notable for the following components:   Opiates POSITIVE (*)    All other components within normal limits  URINALYSIS, ROUTINE W REFLEX MICROSCOPIC - Abnormal; Notable for the following components:   APPearance CLOUDY (*)    Hgb urine dipstick SMALL (*)    Nitrite POSITIVE (*)    Leukocytes,Ua LARGE (*)    All other components within normal limits  URINALYSIS, MICROSCOPIC (REFLEX) - Abnormal; Notable for the following components:   Bacteria, UA MANY (*)    All other components within normal limits  RESP PANEL BY RT-PCR (FLU A&B, COVID) ARPGX2  URINE CULTURE  ETHANOL  PROTIME-INR  APTT  DIFFERENTIAL  CBG MONITORING, ED    EKG EKG Interpretation  Date/Time:  Wednesday March 25 2021 14:18:01 EDT Ventricular Rate:  101 PR Interval:  185 QRS Duration: 97 QT Interval:  337 QTC Calculation: 437 R  Axis:   -47 Text Interpretation: Sinus tachycardia Left anterior fascicular block Probable left ventricular hypertrophy Confirmed by Regan Lemming ([id number]) on 03/25/2021 2:49:35  PM  Radiology CT HEAD CODE STROKE WO CONTRAST  Result Date: 03/25/2021 CLINICAL DATA:  Code stroke. Neuro deficit, acute, stroke suspected. Slurred speech and right-sided weakness beginning 1100 hours. EXAM: CT HEAD WITHOUT CONTRAST TECHNIQUE: Contiguous axial images were obtained from the base of the skull through the vertex without intravenous contrast. COMPARISON:  None. FINDINGS: Brain: No evidence of accelerated atrophy. Minimal small vessel change of the cerebral hemispheric white matter. No sign of acute infarction, mass lesion, hemorrhage, hydrocephalus or extra-axial collection. Vascular: There is atherosclerotic calcification of the major vessels at the base of the brain. Skull: Negative Sinuses/Orbits: Clear/normal Other: None ASPECTS ([place] Stroke Program Early CT Score) - Ganglionic level infarction (caudate, lentiform nuclei, internal capsule, insula, M1-M3 cortex): 7 - Supraganglionic infarction (M4-M6 cortex): 3 Total score (0-10 with 10 being normal): 10 IMPRESSION: 1. No acute finding. Mild chronic small-vessel ischemic change of the white matter. 2. There is atherosclerotic calcification of the major vessels at the base of the brain. 3. ASPECTS is 10 4. These results were communicated to Dr. Quinn Axe at 2:32 pm on 03/25/2021 by text page via the [facility] messaging system. Electronically Signed   By: Nelson Chimes M.D.   On: 03/25/2021 14:33    Procedures .Critical Care Performed by: Nettie Elm, PA-C Authorized by: Nettie Elm, PA-C   Critical care provider statement:    Critical care time (minutes):  31   Critical care was necessary to treat or prevent imminent or life-threatening deterioration of the following conditions:  Circulatory failure and CNS failure or compromise   Critical care was time spent personally by me on the following activities:  Blood draw for specimens, development of treatment plan with patient or surrogate, discussions with consultants, discussions  with primary provider, evaluation of patient's response to treatment, examination of patient, obtaining history from patient or surrogate, pulse oximetry, re-evaluation of patient's condition, review of old charts, ordering and review of radiographic studies, ordering and review of laboratory studies and ordering and performing treatments and interventions   Care discussed with: admitting provider     Medications Ordered in ED Medications  fentaNYL (SUBLIMAZE) injection 50 mcg (has no administration in time range)  cefTRIAXone (ROCEPHIN) 1 g in sodium chloride 0.9 % 100 mL IVPB (has no administration in time range)    ED Course  I have reviewed the triage vital signs and the nursing notes.  Pertinent labs & imaging results that were available during my care of the patient were reviewed by me and considered in my medical decision making (see chart for details).  Here for evaluation headache, weakness and slurred speech.  Initial symptom onset 11 AM.  Code stroke called on arrival given within tPA window.  She possibly has a very minimal right facial droop with decreased grip to right hand.  No drift present.  Speech fluent.  Intact sensation.  Heart lungs clear.  Abdomen soft.  There is large hernia present however nontender without overlying skin changes.  Some lower extremity edema however denies chest pain, shortness of breath.  Patient states this is at baseline.  Does admit to a headache at onset of symptoms.  Denies any sudden onset" headache, traumatic injuries, syncope.   1449: CONSULT with  Dr. Quinn Axe with tele Neuro. Rec impatient admission for further WU. Patient took ASA today. No TNK needed.  Labs  and imaging personally reviewed and interpreted: CT head without acute abnormality UDS positive for opiates however known chronic pain CBC without leukocytosis, Hgb 84.7 Metabolic panel at 207, no other significant abnormality Ethanol neg UA positive for UTI. Reviewed prior culture.  Sensitive to Rocephin. Multiple resistance to Abx. Culture sent. EKG without ischemic changes   Discussed work-up with patient, daughter in room.  All questions answered.  Agreeable for admission for further stroke work-up.  CONSULT with Dr. Lorin Mercy with [facility] who agrees to evaluate patient for admission  Patient discussed with attending, Dr. Armandina Gemma who agrees with above treatment, plan and disposition  The patient appears reasonably stabilized for admission considering the current resources, flow, and capabilities available in the ED at this time, and I doubt any other [facility] requiring further screening and/or treatment in the ED prior to admission.     MDM Rules/Calculators/A&P                            Final Clinical Impression(s) / ED Diagnoses Final diagnoses:  Stroke-like symptoms  Acute nonintractable headache, unspecified headache type  Acute cystitis without hematuria    Rx / DC Orders ED Discharge Orders     None        Shilah Hefel A, PA-C 03/25/21 1539    Regan Lemming, MD 03/25/21 1702

## 2021-03-25 NOTE — Consult Note (Signed)
NEUROLOGY TELECONSULTATION NOTE   Date of service: March 25, 2021 Patient Name: Crystal Lee MRN:  [id number] DOB:  1933/10/17 Reason for consult: telestroke  Requesting Provider: Dr. Regan Lemming Consult Participants: myself, patient, bedside RN, telestroke RN Location of the provider: [facility] Location of the patient: [facility]  This consult was provided via telemedicine with 2-way video and audio communication. The patient/family was informed that care would be provided in this way and agreed to receive care in this manner.   _ _ _   _ __   _ __ _ _  __ __   _ __   __ _  History of Present Illness   87 yo woman with hx HL, HTN, melanoma, OSA, stage I breast cancer presented as stroke code to [facility] for R sided weakness, visual disturbance, and difficulty reading. LKW 1100 today. She was looking at her phone and then the words became jumbled and she could not make sense of them.  She was by herself does not know she was speaking abnormally.  She did call her daughter about 5 minutes later and her daughter said that speech was normal at that time.  Upon arrival to the ED stroke code was activated.  On my examination patient had a stroke scale of 2 for left upper quadrantanopia in her left eye only as well as right facial droop.  Otherwise she was neuro intact.  CT head showed no acute intracranial process.  TNKase was not administered due to rapidly improving symptoms.  Patient took an aspirin this morning but she does not typically take it.  Blood pressure well controlled in ED. She had headache with event initially but this has improved.   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   PONV (postoperative nausea and vomiting)    PT STATES MORPHINE CAUSED N/V   Rosacea    Sleep apnea    Stage 1 breast cancer, ER+, right ([place]) 07/04/2017   Past Surgical History:  Procedure Laterality Date   51 HOUR [facility] STUDY N/A 06/21/2018   Procedure: 24 HOUR [place] STUDY;  Surgeon: Lavena Bullion, DO;  Location: [facility];  Service: Gastroenterology;  Laterality: N/A;  with impedance   ABDOMINAL HYSTERECTOMY  2010   ANAL RECTAL MANOMETRY N/A 09/19/2019   Procedure: ANO RECTAL MANOMETRY;  Surgeon: Mauri Pole, MD;  Location: [facility];  Service: Endoscopy;  Laterality: N/A;   [place]   BIOPSY  06/10/2020   Procedure: BIOPSY;  Surgeon: Lavena Bullion, DO;  Location: [facility];  Service: Gastroenterology;;  EGD and COLON   BREAST SURGERY     CATARACT EXTRACTION Bilateral 2011   CHOLECYSTECTOMY     COLON RESECTION N/A 06/12/2020   Procedure: LAPAROSCOPIC COLON RESECTION;  Surgeon: [name], MD;  Location: [facility];  Service:  General;  Laterality: N/A;   COLONOSCOPY WITH PROPOFOL N/A 06/10/2020   Procedure: COLONOSCOPY WITH PROPOFOL;  Surgeon: Lavena Bullion, DO;  Location: [facility];  Service: Gastroenterology;  Laterality: N/A;   ESOPHAGEAL MANOMETRY N/A 06/21/2018   Procedure: ESOPHAGEAL MANOMETRY (EM);  Surgeon: Lavena Bullion, DO;  [facility]: [facility];  Service: Gastroenterology;  Laterality: N/A;   ESOPHAGOGASTRODUODENOSCOPY (EGD) WITH PROPOFOL N/A 06/10/2020   Procedure: ESOPHAGOGASTRODUODENOSCOPY (EGD) WITH PROPOFOL;  Surgeon: Lavena Bullion, DO;  [facility];  Service: Gastroenterology;  Laterality: N/A;   GALLBLADDER SURGERY  2015   JOINT REPLACEMENT     RT TOTAL HIP / RT TOTAL KNEE   MASTECTOMY   1998   BILATERAL    [place] IMPEDANCE STUDY  06/21/2018   Procedure: [place] IMPEDANCE STUDY;  Surgeon: Lavena Bullion, DO;  Location: [facility];  Service: Gastroenterology;;   POLYPECTOMY  06/10/2020   Procedure: POLYPECTOMY;  Surgeon: Lavena Bullion, DO;  Location: [facility];  Service: Gastroenterology;;   SKIN CANCER EXCISION  2016   RT SIDE OF NOSE   SUBMUCOSAL TATTOO INJECTION  06/10/2020   Procedure: SUBMUCOSAL TATTOO INJECTION;  Surgeon: Lavena Bullion, DO;  Location: [facility];  Service: Gastroenterology;;   [place]  2010   RIGHT   TOTAL HIP ARTHROPLASTY Left 05/09/2015   Procedure: LEFT TOTAL HIP ARTHROPLASTY ANTERIOR APPROACH;  Surgeon: Gaynelle Arabian, MD;  Location: [facility];  Service: Orthopedics;  Laterality: Left;   TOTAL KNEE ARTHROPLASTY  2001   TUMOR REMOVAL  2012   ABDOMINAL - NON CANCEROUS   Family History  Problem Relation Age of Onset   Other Mother        complications from flu   Other Father        unsure of cause   Colon cancer Neg Hx    Social History   Socioeconomic History   Marital status: Widowed    Spouse name: Not on file   Number of children: 3   Years of education: 16 years   Highest education level: Not on file  Occupational History   Occupation: Retired  Tobacco Use   Smoking status: Former    Packs/day: 0.50    Years: 20.00    Pack years: 10.00    Types: Cigarettes    Quit date: 02/03/1976    Years since quitting: 45.1   Smokeless tobacco: Never  Vaping Use   Vaping Use: Never used  Substance and Sexual Activity   Alcohol use: No   Drug use: No   Sexual activity: Yes    Birth control/protection: Post-menopausal  Other Topics Concern   Not on file  Social History Narrative   Lives at home with husband.   Right-handed.   No caffeine use.   Social Determinants of Health   Financial Resource Strain: Not on file  Food Insecurity: Not on file  Transportation Needs: Not on file   Physical Activity: Not on file  Stress: Not on file  Social Connections: Not on file   Allergies  Allergen Reactions   Darvon [Propoxyphene] Nausea And Vomiting and Palpitations    Darvocet Causes Sweats   Adhesive [Tape] Rash   Morphine And Related Nausea And Vomiting    Medications   (Not in a hospital admission)    No current facility-administered medications for this encounter.  Current Outpatient Medications:    amoxicillin-clavulanate (AUGMENTIN) 500-125 MG tablet, Take 1 tablet (500 mg total) by mouth 2 (two)  times daily., Disp: 14 tablet, Rfl: 0   cyclobenzaprine (FLEXERIL) 5 MG tablet, Take 1 tablet (5 mg total) by mouth at bedtime as needed for muscle spasms., Disp: 90 tablet, Rfl: 0   diphenoxylate-atropine (LOMOTIL) 2.5-0.025 MG tablet, Take 1 tablet by mouth 4 (four) times daily as needed for diarrhea or loose stools., Disp: 30 tablet, Rfl: 0   estradiol (ESTRACE VAGINAL) 0.1 MG/GM vaginal cream, Apply 1gm vaginally 1-3x a week as needed for comfort, Disp: 42.5 g, Rfl: 12   fenofibrate (TRICOR) 48 MG tablet, Take 1 tablet (48 mg total) by mouth daily., Disp: 90 tablet, Rfl: 3   HYDROcodone-acetaminophen (NORCO) 10-325 MG tablet, Take 1 tablet by mouth every 6 (six) hours as needed., Disp: 120 tablet, Rfl: 0   ipratropium (ATROVENT) 0.03 % nasal spray, Place 2 sprays into both nostrils 4 (four) times daily as needed for rhinitis., Disp: 30 mL, Rfl: 12   loperamide (IMODIUM A-D) 2 MG tablet, Take 2 mg by mouth 4 (four) times daily as needed for diarrhea or loose stools., Disp: , Rfl:    losartan (COZAAR) 25 MG tablet, Take 1 tablet (25 mg total) by mouth daily., Disp: 90 tablet, Rfl: 3   Multiple Vitamins-Minerals (MULTIVITAMIN ADULTS) TABS, Take 1 tablet by mouth daily., Disp: , Rfl:    naloxegol oxalate (MOVANTIK) 12.5 MG TABS tablet, Take 1 tablet (12.5 mg total) by mouth daily., Disp: 90 tablet, Rfl: 3   neomycin-polymyxin b-dexamethasone (MAXITROL) 3.5-10000-0.1 SUSP,  Place 1 drop into the left eye daily as needed (dye eye)., Disp: , Rfl:    phenazopyridine (PYRIDIUM) 200 MG tablet, Take 1 tablet (200 mg total) by mouth 3 (three) times daily as needed for pain., Disp: 40 tablet, Rfl: 0   promethazine (PHENERGAN) 12.5 MG tablet, Take 1 tablet (12.5 mg total) by mouth every 6 (six) hours as needed for nausea or vomiting., Disp: 90 tablet, Rfl: 3   valACYclovir (VALTREX) 1000 MG tablet, Take 1,000 mg by mouth daily., Disp: , Rfl:   Vitals   Vitals:   03/25/21 1444  BP: (!) 149/61  Pulse: 99  Resp: 15  SpO2: 96%     There is no height or weight on file to calculate BMI.  Physical Exam   Exam performed over telemedicine with 2-way video and audio communication and with assistance of bedside RN  Physical Exam Gen: A&O x4, NAD Resp: normal WOB CV: extremities appear well-perfused  Neuro: *MS: A&O x4. Follows multi-step commands.  *Speech: nondysarthric, no aphasia, able to name and repeat *CN: PERRL 79mm, EOMI, ?L upper quadrantopia in L eye only, sensation intact, mild R UMN facial droop, hearing intact to voice *Motor:   Normal bulk.  No tremor, rigidity or bradykinesia. No pronator drift. All extremities appear full-strength and symmetric. *Sensory: SILT. Symmetric. No double-simultaneous extinction.  *Coordination:  Finger-to-nose, heel-to-shin, rapid alternating motions were intact. *Reflexes:  UTA 2/2 tele-exam *Gait: deferred  NIHSS 2 for left upper quadrantanopia in her left eye only as well as right facial droop   Premorbid mRS = 1   Labs   CBC:  Recent Labs  Lab 03/25/21 1418  WBC 6.8  NEUTROABS 3.8  HGB 13.4  HCT 41.0  MCV 91.1  PLT 141*    Basic Metabolic Panel:  Lab Results  Component Value Date   NA 139 03/09/2021   K 4.2 03/09/2021   CO2 28 03/09/2021   GLUCOSE 117 (H) 03/09/2021   BUN 13 03/09/2021   CREATININE 0.67 03/09/2021  CALCIUM 10.2 03/09/2021   GFRNONAA >60 03/09/2021   GFRAA 93 02/25/2020    Lipid Panel:  Lab Results  Component Value Date   LDLCALC 85 07/17/2020   HgbA1c:  Lab Results  Component Value Date   HGBA1C 5.4 02/09/2017   Urine Drug Screen: No results found for: LABOPIA, COCAINSCRNUR, LABBENZ, AMPHETMU, THCU, LABBARB  Alcohol Level No results found for: ETH   Impression   87 yo woman with hx HL, HTN, melanoma, OSA, stage I breast cancer presented as stroke [facility] for R sided weakness, visual disturbance, and difficulty reading. Current deficits include visual disturbance and R facial droop. CT head NAICP. TNKase not administered 2/2 rapidly improving sx.  Recommendations   - Transfer to [facility] hospitalist service for stroke w/u; page neuro team there on arrival and they will follow - Permissive HTN x48 hrs from sx onset or until stroke ruled out by MRI goal BP <220/110. PRN labetalol or hydralazine if BP above these parameters. Avoid oral antihypertensives. - MRI brain wo contrast - CTA or MRA H&N - TTE  - Check A1c and LDL + add statin per guidelines - Continue ASA 81mg  daily - q4 hr neuro checks - STAT head CT for any change in neuro exam - Tele - PT/OT/SLP - Stroke education - Amb referral to neurology upon discharge   D/w Dr. Armandina Gemma EDP ______________________________________________________________________   Thank you for the opportunity to take part in the care of this patient. If you have any further questions, please contact the neurology consultation attending.  Signed,  Su Monks, MD [facility] [phone]  If 7pm- 7am, please page neurology on call as listed in [place].

## 2021-03-25 NOTE — ED Notes (Signed)
Report given to [facility]. 

## 2021-03-25 NOTE — ED Notes (Signed)
Telestroke contacted.

## 2021-03-25 NOTE — ED Notes (Signed)
Pt. To CT scan.

## 2021-03-26 ENCOUNTER — Observation Stay (HOSPITAL_COMMUNITY): Payer: Medicare Other

## 2021-03-26 ENCOUNTER — Observation Stay (HOSPITAL_BASED_OUTPATIENT_CLINIC_OR_DEPARTMENT_OTHER): Payer: Medicare Other

## 2021-03-26 ENCOUNTER — Other Ambulatory Visit: Payer: Medicare Other

## 2021-03-26 ENCOUNTER — Encounter (HOSPITAL_COMMUNITY): Payer: Self-pay | Admitting: Internal Medicine

## 2021-03-26 DIAGNOSIS — R479 Unspecified speech disturbances: Secondary | ICD-10-CM | POA: Diagnosis not present

## 2021-03-26 DIAGNOSIS — I680 Cerebral amyloid angiopathy: Secondary | ICD-10-CM | POA: Diagnosis not present

## 2021-03-26 DIAGNOSIS — I672 Cerebral atherosclerosis: Secondary | ICD-10-CM | POA: Diagnosis not present

## 2021-03-26 DIAGNOSIS — N3000 Acute cystitis without hematuria: Secondary | ICD-10-CM

## 2021-03-26 DIAGNOSIS — C186 Malignant neoplasm of descending colon: Secondary | ICD-10-CM | POA: Diagnosis not present

## 2021-03-26 DIAGNOSIS — R29818 Other symptoms and signs involving the nervous system: Secondary | ICD-10-CM | POA: Diagnosis not present

## 2021-03-26 DIAGNOSIS — G459 Transient cerebral ischemic attack, unspecified: Secondary | ICD-10-CM

## 2021-03-26 DIAGNOSIS — I159 Secondary hypertension, unspecified: Secondary | ICD-10-CM | POA: Diagnosis not present

## 2021-03-26 DIAGNOSIS — I10 Essential (primary) hypertension: Secondary | ICD-10-CM | POA: Diagnosis not present

## 2021-03-26 DIAGNOSIS — R2990 Unspecified symptoms and signs involving the nervous system: Secondary | ICD-10-CM | POA: Diagnosis not present

## 2021-03-26 DIAGNOSIS — I63233 Cerebral infarction due to unspecified occlusion or stenosis of bilateral carotid arteries: Secondary | ICD-10-CM | POA: Diagnosis not present

## 2021-03-26 DIAGNOSIS — R531 Weakness: Secondary | ICD-10-CM | POA: Diagnosis not present

## 2021-03-26 LAB — ECHOCARDIOGRAM COMPLETE
AR max vel: 2.38 cm2
AV Peak grad: 6.5 mmHg
Ao pk vel: 1.28 m/s
Area-P 1/2: 3.31 cm2
Calc EF: 57.3 %
Height: 66 in
S' Lateral: 4 cm
Single Plane A2C EF: 58.4 %
Single Plane A4C EF: 59 %
Weight: 3252.23 oz

## 2021-03-26 LAB — LIPID PANEL
Cholesterol: 144 mg/dL (ref 0–200)
HDL: 36 mg/dL — ABNORMAL LOW (ref >40)
LDL Cholesterol: 91 mg/dL (ref 0–99)
Total CHOL/HDL Ratio: 4 RATIO
Triglycerides: 83 mg/dL (ref <150)
VLDL: 17 mg/dL (ref 0–40)

## 2021-03-26 LAB — CBC
HCT: 38 % (ref 36.0–46.0)
Hemoglobin: 12.6 g/dL (ref 12.0–15.0)
MCH: 30.2 pg (ref 26.0–34.0)
MCHC: 33.2 g/dL (ref 30.0–36.0)
MCV: 91.1 fL (ref 80.0–100.0)
Platelets: 125 10*3/uL — ABNORMAL LOW (ref 150–400)
RBC: 4.17 MIL/uL (ref 3.87–5.11)
RDW: 12.6 % (ref 11.5–15.5)
WBC: 6.3 10*3/uL (ref 4.0–10.5)
nRBC: 0 % (ref 0.0–0.2)

## 2021-03-26 LAB — HEMOGLOBIN A1C
Hgb A1c MFr Bld: 5 % (ref 4.8–5.6)
Mean Plasma Glucose: 96.8 mg/dL

## 2021-03-26 LAB — CREATININE, SERUM
Creatinine, Ser: 0.72 mg/dL (ref 0.44–1.00)
GFR, Estimated: >60 mL/min (ref >60)

## 2021-03-26 MED ORDER — LOSARTAN POTASSIUM 25 MG PO TABS
25.0000 mg | ORAL_TABLET | Freq: Every day | ORAL | Status: DC
  Administered 2021-03-26 – 2021-03-27 (×2): 25 mg via ORAL
  Filled 2021-03-26 (×2): qty 1

## 2021-03-26 MED ORDER — ACETAMINOPHEN 160 MG/5ML PO SOLN: 650.0000 mg | ORAL | Status: DC | PRN

## 2021-03-26 MED ORDER — PROMETHAZINE HCL 25 MG PO TABS: 12.5000 mg | ORAL_TABLET | Freq: Four times a day (QID) | ORAL | Status: DC | PRN

## 2021-03-26 MED ORDER — ATORVASTATIN CALCIUM 40 MG PO TABS: 40.0000 mg | ORAL_TABLET | Freq: Every day | ORAL | Status: DC

## 2021-03-26 MED ORDER — ASPIRIN 325 MG PO TABS
325.0000 mg | ORAL_TABLET | Freq: Every day | ORAL | Status: DC
  Administered 2021-03-26: 325 mg via ORAL
  Filled 2021-03-26: qty 1

## 2021-03-26 MED ORDER — ACETAMINOPHEN 325 MG PO TABS: 650.0000 mg | ORAL_TABLET | ORAL | Status: DC | PRN

## 2021-03-26 MED ORDER — ASPIRIN 300 MG RE SUPP
300.0000 mg | Freq: Every day | RECTAL | Status: DC
  Filled 2021-03-26: qty 1

## 2021-03-26 MED ORDER — ATORVASTATIN CALCIUM 10 MG PO TABS
20.0000 mg | ORAL_TABLET | Freq: Every day | ORAL | Status: DC
  Administered 2021-03-27: 20 mg via ORAL
  Filled 2021-03-26: qty 2

## 2021-03-26 MED ORDER — HYDROCODONE-ACETAMINOPHEN 10-325 MG PO TABS
1.0000 | ORAL_TABLET | Freq: Four times a day (QID) | ORAL | Status: DC | PRN
  Administered 2021-03-26: 1 via ORAL
  Filled 2021-03-26 (×2): qty 1

## 2021-03-26 MED ORDER — ACETAMINOPHEN 650 MG RE SUPP: 650.0000 mg | RECTAL | Status: DC | PRN

## 2021-03-26 MED ORDER — STROKE: EARLY STAGES OF RECOVERY BOOK
Freq: Once | Status: AC
  Filled 2021-03-26: qty 1

## 2021-03-26 MED ORDER — ENOXAPARIN SODIUM 40 MG/0.4ML IJ SOSY
40.0000 mg | PREFILLED_SYRINGE | INTRAMUSCULAR | Status: DC
  Administered 2021-03-26 – 2021-03-27 (×2): 40 mg via SUBCUTANEOUS
  Filled 2021-03-26 (×2): qty 0.4

## 2021-03-26 MED ORDER — SODIUM CHLORIDE 0.9 % IV SOLN: INTRAVENOUS | Status: DC

## 2021-03-26 MED ORDER — ASPIRIN EC 81 MG PO TBEC
81.0000 mg | DELAYED_RELEASE_TABLET | Freq: Every day | ORAL | Status: DC
  Administered 2021-03-27: 81 mg via ORAL
  Filled 2021-03-26: qty 1

## 2021-03-26 MED ORDER — CYCLOBENZAPRINE HCL 10 MG PO TABS: 5.0000 mg | ORAL_TABLET | Freq: Every evening | ORAL | Status: DC | PRN

## 2021-03-26 MED ORDER — SODIUM CHLORIDE 0.9 % IV SOLN
1.0000 g | INTRAVENOUS | Status: DC
  Filled 2021-03-26: qty 10

## 2021-03-26 MED ORDER — HYDRALAZINE HCL 20 MG/ML IJ SOLN: 10.0000 mg | INTRAMUSCULAR | Status: DC | PRN

## 2021-03-26 MED ORDER — FENOFIBRATE 54 MG PO TABS
54.0000 mg | ORAL_TABLET | Freq: Every day | ORAL | Status: DC
  Administered 2021-03-26 – 2021-03-27 (×2): 54 mg via ORAL
  Filled 2021-03-26 (×3): qty 1

## 2021-03-26 MED ORDER — IOHEXOL 350 MG/ML SOLN
50.0000 mL | Freq: Once | INTRAVENOUS | Status: AC | PRN
  Administered 2021-03-26: 50 mL via INTRAVENOUS

## 2021-03-26 MED ORDER — ADULT MULTIVITAMIN W/MINERALS CH
1.0000 | ORAL_TABLET | Freq: Every day | ORAL | Status: DC
  Administered 2021-03-26 – 2021-03-27 (×2): 1 via ORAL
  Filled 2021-03-26 (×2): qty 1

## 2021-03-26 MED ORDER — SULFAMETHOXAZOLE-TRIMETHOPRIM 800-160 MG PO TABS
1.0000 | ORAL_TABLET | Freq: Two times a day (BID) | ORAL | Status: DC
  Administered 2021-03-26 – 2021-03-27 (×3): 1 via ORAL
  Filled 2021-03-26 (×3): qty 1

## 2021-03-26 NOTE — Progress Notes (Addendum)
STROKE TEAM PROGRESS NOTE   ATTENDING NOTE: I reviewed above note and agree with the assessment and plan. Pt was seen and examined.   87 year old female with history of hypertension, hyperlipidemia, OSA, breast cancer and colon cancer in remission admitted for reading and writing difficulty, and right facial droop.  Currently symptom resolved.  CT and MRI no acute stroke, however MRI concerning for amyloid angiopathy given numerous tiny scattered microhemorrhages throughout the cortex bilaterally, greatest in the parietal occipital lobes.  CTA head and neck unremarkable.  EF 55 to 60%.  LDL 91, A1c 5.0.  EEG normal.  On exam, patient neurologically intact, speech normal, no aphasia or dysarthria, follows simple commands, able to name and read and repeat.  Patient does report recent headache for the last several days, but currently no headache.  Etiology for patient symptoms not quite clear, DDx including TIA, complicated migraine, seizure or transient focal neurological episode (TFNE) associated with amyloid angiopathy.  Recommend aspirin 81 on discharge, no DAPT given concerning for amyloid angiopathy.  Added Lipitor 20, continue on discharge.  Recommend long-term BP less than 140 given concerning for amyloid angiopathy.  PT/OT no recommendation.  For detailed assessment and plan, please refer to above as I have made changes wherever appropriate.   Neurology will sign off. Please call with questions. Pt will follow up with Dr. Krista Blue at [facility] in about 4 weeks. Thanks for the consult.   Rosalin Hawking, MD PhD Stroke Neurology 03/26/2021 8:59 PM    INTERVAL HISTORY Patient is seen in her room with no family at the bedside.  She has been hemodynamically stable and is in no acute distress. She is getting ready for her EEG.  She states that yesterday, she was using her phone and noticed that all of the words were running together and that she was unable to read.   She proceeded to see her primary care  provider, who sent her to the ED.  Her symptoms resolved spontaneously upon presentation to the ED, and she states that she has no lingering deficits.  Of note, she states that over the past several weeks, she has been experiencing headaches localized to the top of her head.  Vitals:   03/25/21 2346 03/26/21 0314 03/26/21 0748 03/26/21 1132  BP:  126/68 (!) 148/50 (!) 143/56  Pulse: 64 65 65 64  Resp:  20 (!) 21 18  Temp: 97.9 F (36.6 C) 97.7 F (36.5 C)  97.9 F (36.6 C)  TempSrc: Oral Oral  Oral  SpO2:  96% 100% 95%  Weight: 92.2 kg     Height: 5\' 6"  (1.676 m)      CBC:  Recent Labs  Lab 03/25/21 1418 03/26/21 0242  WBC 6.8 6.3  NEUTROABS 3.8  --   HGB 13.4 12.6  HCT 41.0 38.0  MCV 91.1 91.1  PLT 141* 938*   Basic Metabolic Panel:  Recent Labs  Lab 03/25/21 1418 03/26/21 0242  NA 136  --   K 3.8  --   CL 103  --   CO2 25  --   GLUCOSE 103*  --   BUN 14  --   CREATININE 0.59 0.72  CALCIUM 9.4  --    Lipid Panel:  Recent Labs  Lab 03/26/21 0242  CHOL 144  TRIG 83  HDL 36*  CHOLHDL 4.0  VLDL 17  LDLCALC 91   HgbA1c:  Recent Labs  Lab 03/26/21 0242  HGBA1C 5.0   Urine Drug Screen:  Recent Labs  Lab 03/25/21 1418  LABOPIA POSITIVE*  COCAINSCRNUR NONE DETECTED  LABBENZ NONE DETECTED  AMPHETMU NONE DETECTED  THCU NONE DETECTED  LABBARB NONE DETECTED    Alcohol Level  Recent Labs  Lab 03/25/21 1418  ETH <10    IMAGING past 24 hours CT ANGIO HEAD W OR WO CONTRAST  Result Date: 03/26/2021 CLINICAL DATA:  Follow-up examination for stroke. EXAM: CT ANGIOGRAPHY HEAD AND NECK TECHNIQUE: Multidetector CT imaging of the head and neck was performed using the standard protocol during bolus administration of intravenous contrast. Multiplanar CT image reconstructions and MIPs were obtained to evaluate the vascular anatomy. Carotid stenosis measurements (when applicable) are obtained utilizing NASCET criteria, using the distal internal carotid diameter  as the denominator. CONTRAST:  75mL OMNIPAQUE IOHEXOL 350 MG/ML SOLN COMPARISON:  CT from 03/25/2021. FINDINGS: CT HEAD FINDINGS Brain: Cerebral volume within normal limits for age. Mild chronic small vessel ischemic disease. No acute intracranial hemorrhage. No visible acute large vessel territory infarct. No mass lesion, mass effect or midline shift. No hydrocephalus or extra-axial fluid collection. Vascular: No hyperdense vessel. Scattered vascular calcifications noted within the carotid siphons. Skull: No new finding. Sinuses: Clear. Orbits: No acute finding.  Senescent calcifications noted. Review of the MIP images confirms the above findings CTA NECK FINDINGS Aortic arch: Visualized aortic arch normal in caliber with normal branch pattern. Mild-to-moderate atheromatous change about the arch and origin of the great vessels without stenosis. Right carotid system: Right common and internal carotid arteries patent without stenosis or other acute vascular abnormality. Mild for age plaque about the right carotid bulb without stenosis. Left carotid system: Left common and internal carotid arteries patent without stenosis or other acute vascular abnormality. Mild for age plaque about the left carotid bulb without stenosis. Vertebral arteries: Both vertebral arteries arise from subclavian arteries. Atheromatous plaque about the origins of both vertebral arteries without significant stenosis. Vertebral arteries patent distally without stenosis or other acute vascular abnormality. Skeleton: No worrisome osseous lesions.  Mild for age spondylosis. Other neck: No other soft tissue abnormality within the neck. Upper chest: Visualized upper chest demonstrates no acute finding. Review of the MIP images confirms the above findings CTA HEAD FINDINGS Anterior circulation: Petrous segments patent bilaterally. Mild atheromatous plaque within the carotid siphons without significant stenosis. A1 segments widely patent. Normal  anterior communicating artery complex. Anterior cerebral arteries patent without stenosis. No M1 stenosis or occlusion. Distal MCA branches well perfused and symmetric. Posterior circulation: Vertebral arteries patent the vertebrobasilar junction without stenosis. Both PICA origins grossly patent and normal. Basilar patent without stenosis. Superior cerebellar arteries patent bilaterally. Both PCAs primarily supplied via the basilar well perfused or distal aspects. Venous sinuses: Grossly patent allowing for timing the contrast bolus. Anatomic variants: None significant. Review of the MIP images confirms the above findings IMPRESSION: 1. Negative CTA for large vessel occlusion. 2. Mild for age atheromatous change about the major arterial vasculature of the head and neck as above without hemodynamically significant or correctable stenosis. 3. No other new acute intracranial abnormality. Electronically Signed   By: Jeannine Boga M.D.   On: 03/26/2021 03:22   CT ANGIO NECK W OR WO CONTRAST  Result Date: 03/26/2021 CLINICAL DATA:  Follow-up examination for stroke. EXAM: CT ANGIOGRAPHY HEAD AND NECK TECHNIQUE: Multidetector CT imaging of the head and neck was performed using the standard protocol during bolus administration of intravenous contrast. Multiplanar CT image reconstructions and MIPs were obtained to evaluate the vascular anatomy. Carotid stenosis measurements (when applicable) are obtained  utilizing NASCET criteria, using the distal internal carotid diameter as the denominator. CONTRAST:  69mL OMNIPAQUE IOHEXOL 350 MG/ML SOLN COMPARISON:  CT from 03/25/2021. FINDINGS: CT HEAD FINDINGS Brain: Cerebral volume within normal limits for age. Mild chronic small vessel ischemic disease. No acute intracranial hemorrhage. No visible acute large vessel territory infarct. No mass lesion, mass effect or midline shift. No hydrocephalus or extra-axial fluid collection. Vascular: No hyperdense vessel. Scattered  vascular calcifications noted within the carotid siphons. Skull: No new finding. Sinuses: Clear. Orbits: No acute finding.  Senescent calcifications noted. Review of the MIP images confirms the above findings CTA NECK FINDINGS Aortic arch: Visualized aortic arch normal in caliber with normal branch pattern. Mild-to-moderate atheromatous change about the arch and origin of the great vessels without stenosis. Right carotid system: Right common and internal carotid arteries patent without stenosis or other acute vascular abnormality. Mild for age plaque about the right carotid bulb without stenosis. Left carotid system: Left common and internal carotid arteries patent without stenosis or other acute vascular abnormality. Mild for age plaque about the left carotid bulb without stenosis. Vertebral arteries: Both vertebral arteries arise from subclavian arteries. Atheromatous plaque about the origins of both vertebral arteries without significant stenosis. Vertebral arteries patent distally without stenosis or other acute vascular abnormality. Skeleton: No worrisome osseous lesions.  Mild for age spondylosis. Other neck: No other soft tissue abnormality within the neck. Upper chest: Visualized upper chest demonstrates no acute finding. Review of the MIP images confirms the above findings CTA HEAD FINDINGS Anterior circulation: Petrous segments patent bilaterally. Mild atheromatous plaque within the carotid siphons without significant stenosis. A1 segments widely patent. Normal anterior communicating artery complex. Anterior cerebral arteries patent without stenosis. No M1 stenosis or occlusion. Distal MCA branches well perfused and symmetric. Posterior circulation: Vertebral arteries patent the vertebrobasilar junction without stenosis. Both PICA origins grossly patent and normal. Basilar patent without stenosis. Superior cerebellar arteries patent bilaterally. Both PCAs primarily supplied via the basilar well perfused or  distal aspects. Venous sinuses: Grossly patent allowing for timing the contrast bolus. Anatomic variants: None significant. Review of the MIP images confirms the above findings IMPRESSION: 1. Negative CTA for large vessel occlusion. 2. Mild for age atheromatous change about the major arterial vasculature of the head and neck as above without hemodynamically significant or correctable stenosis. 3. No other new acute intracranial abnormality. Electronically Signed   By: Jeannine Boga M.D.   On: 03/26/2021 03:22   MR BRAIN WO CONTRAST  Result Date: 03/26/2021 CLINICAL DATA:  Neuro deficit, acute, stroke suspected EXAM: MRI HEAD WITHOUT CONTRAST TECHNIQUE: Multiplanar, multiecho pulse sequences of the brain and surrounding structures were obtained without intravenous contrast. COMPARISON:  CT head 03/25/2021. FINDINGS: Brain: No acute infarction, hemorrhage, hydrocephalus, extra-axial collection or mass lesion. Mild to moderate scattered T2 hyperintensities in the white matter, nonspecific but compatible with chronic microvascular ischemic disease. Numerous tiny scattered foci of susceptibility artifact throughout the cortex bilaterally, greatest in the parieto-occipital lobes. Vascular: Major arterial flow voids are maintained skull base. Skull and upper cervical spine: Normal marrow signal. Sinuses/Orbits: Clear sinuses.  Unremarkable orbits. Other: Trace bilateral mastoid fluid. IMPRESSION: 1. No evidence of acute intracranial abnormality, including infarct. 2. Numerous tiny scattered foci of susceptibility artifact throughout the cortex bilaterally, greatest in the parieto-occipital lobes. While nonspecific, findings could relate to prior microhemorrhages from amyloid angiopathy or prior microemboli. The distribution is atypical for hypertensive hemorrhages. Electronically Signed   By: Margaretha Sheffield M.D.   On: 03/26/2021 11:04  ECHOCARDIOGRAM COMPLETE  Result Date: 03/26/2021    ECHOCARDIOGRAM  REPORT   Patient Name:   KRISS PERLEBERG Date of Exam: 03/26/2021 Medical Rec #:  [id number]    Height:       66.0 in Accession #:    [id number]   Weight:       203.3 lb Date of Birth:  Dec 17, 1933    BSA:          2.014 m Patient Age:    58 years     BP:           149/60 mmHg Patient Gender: F            HR:           65 bpm. Exam Location:  Inpatient Procedure: 2D Echo, Cardiac Doppler and Color Doppler Indications:    Stroke  History:        Patient has prior history of Echocardiogram examinations. Risk                 Factors:Hypertension.  Sonographer:    Jyl Heinz Referring Phys: Morrill  1. Left ventricular ejection fraction, by estimation, is 55 to 60%. The left ventricle has normal function. The left ventricle has no regional wall motion abnormalities. Left ventricular diastolic parameters are indeterminate.  2. Right ventricular systolic function is normal. The right ventricular size is normal. There is mildly elevated pulmonary artery systolic pressure. The estimated right ventricular systolic pressure is 58.8 mmHg.  3. The mitral valve is normal in structure. Trivial mitral valve regurgitation. No evidence of mitral stenosis.  4. The aortic valve is tricuspid. Aortic valve regurgitation is not visualized. Mild aortic valve sclerosis is present, with no evidence of aortic valve stenosis.  5. The inferior vena cava is dilated in size with <50% respiratory variability, suggesting right atrial pressure of 15 mmHg. FINDINGS  Left Ventricle: Left ventricular ejection fraction, by estimation, is 55 to 60%. The left ventricle has normal function. The left ventricle has no regional wall motion abnormalities. The left ventricular internal cavity size was normal in size. There is  no left ventricular hypertrophy. Left ventricular diastolic parameters are indeterminate. Normal left ventricular filling pressure. Right Ventricle: The right ventricular size is normal. No increase in right  ventricular wall thickness. Right ventricular systolic function is normal. There is mildly elevated pulmonary artery systolic pressure. The tricuspid regurgitant velocity is 2.38  m/s, and with an assumed right atrial pressure of 15 mmHg, the estimated right ventricular systolic pressure is 50.2 mmHg. Left Atrium: Left atrial size was normal in size. Right Atrium: Right atrial size was normal in size. Pericardium: There is no evidence of pericardial effusion. Mitral Valve: The mitral valve is normal in structure. Mild mitral annular calcification. Trivial mitral valve regurgitation. No evidence of mitral valve stenosis. Tricuspid Valve: The tricuspid valve is normal in structure. Tricuspid valve regurgitation is trivial. No evidence of tricuspid stenosis. Aortic Valve: The aortic valve is tricuspid. Aortic valve regurgitation is not visualized. Mild aortic valve sclerosis is present, with no evidence of aortic valve stenosis. Aortic valve peak gradient measures 6.5 mmHg. Pulmonic Valve: The pulmonic valve was normal in structure. Pulmonic valve regurgitation is trivial. No evidence of pulmonic stenosis. Aorta: The aortic root is normal in size and structure. Venous: The inferior vena cava is dilated in size with less than 50% respiratory variability, suggesting right atrial pressure of 15 mmHg. IAS/Shunts: The interatrial septum appears to be lipomatous. No atrial level  shunt detected by color flow Doppler.  LEFT VENTRICLE PLAX 2D LVIDd:         5.20 cm     Diastology LVIDs:         4.00 cm     LV e' medial:    6.42 cm/s LV PW:         0.80 cm     LV E/e' medial:  13.2 LV IVS:        0.90 cm     LV e' lateral:   8.59 cm/s LVOT diam:     2.00 cm     LV E/e' lateral: 9.8 LV SV:         75 LV SV Index:   37 LVOT Area:     3.14 cm  LV Volumes (MOD) LV vol d, MOD A2C: 96.3 ml LV vol d, MOD A4C: 85.9 ml LV vol s, MOD A2C: 40.1 ml LV vol s, MOD A4C: 35.2 ml LV SV MOD A2C:     56.2 ml LV SV MOD A4C:     85.9 ml LV SV MOD  BP:      52.9 ml RIGHT VENTRICLE             IVC RV Basal diam:  3.50 cm     IVC diam: 2.70 cm RV S prime:     12.20 cm/s TAPSE (M-mode): 2.1 cm LEFT ATRIUM             Index        RIGHT ATRIUM           Index LA diam:        3.80 cm 1.89 cm/m   RA Area:     15.80 cm LA Vol (A2C):   46.0 ml 22.84 ml/m  RA Volume:   38.90 ml  19.32 ml/m LA Vol (A4C):   40.7 ml 20.21 ml/m LA Biplane Vol: 45.0 ml 22.35 ml/m  AORTIC VALVE AV Area (Vmax): 2.38 cm AV Vmax:        127.50 cm/s AV Peak Grad:   6.5 mmHg LVOT Vmax:      96.70 cm/s LVOT Vmean:     70.550 cm/s LVOT VTI:       0.238 m  AORTA Ao Root diam: 3.30 cm Ao Asc diam:  3.60 cm MITRAL VALVE               TRICUSPID VALVE MV Area (PHT): 3.31 cm    TR Peak grad:   22.7 mmHg MV Decel Time: 229 msec    TR Vmax:        238.00 cm/s MV E velocity: 84.60 cm/s MV A velocity: 77.80 cm/s  SHUNTS MV E/A ratio:  1.09        Systemic VTI:  0.24 m                            Systemic Diam: 2.00 cm Fransico Him MD Electronically signed by Fransico Him MD Signature Date/Time: 03/26/2021/9:05:31 AM    Final     PHYSICAL EXAM General: Patient is an alert, well-nourished female in no acute distress.   NEURO:  Mental Status: AA&Ox3  Speech/Language: speech is without dysarthria or aphasia.  Naming, repetition, fluency, and comprehension intact.  Able to read a paragraph normally.  Cranial Nerves:  II: PERRL.  III, IV, VI: EOMI. Eyelids elevate symmetrically.  V: Sensation is intact to light touch and symmetrical to face.  VII: Smile is symmetrical. Able to puff cheeks and raise eyebrows.  VIII: hearing intact to voice. IX, X: Palate elevates symmetrically. Phonation is normal.  LE:[id number] shrug 5/5. XII: tongue is midline without fasciculations. Motor: 5/5 strength to all muscle groups tested.  Tone: is normal and bulk is normal Sensation- Intact to light touch bilaterally.   Coordination: No drift.  Gait- deferred   ASSESSMENT/PLAN Ms. Leonette Tischer is a 87  y.o. female with history of HTN, HLD, sleep apnea, breast CA and colon CA presenting with inability to recognize written language or write and right sided facial droop  which resolved spontaneously.   TIA vs. Complicated migraine vs. TFNE CTA head & neck Negative CTA for large vessel occlusion. Mild for age atheromatous change about the major arterial vasculature of the head and neck without hemodynamically significant or correctable stenosis. MRI  No evidence of acute intracranial abnormality, including infarct. Numerous tiny scattered foci of susceptibility artifact throughout the cortex bilaterally, greatest in the parieto-occipital lobes. While nonspecific, findings could relate to prior microhemorrhages from amyloid angiopathy or prior microemboli. The distribution is atypical for hypertensive hemorrhages 2D Echo EF 55-60%, normal left ventricular function, normal left atrium, lipomatous interatrial septum with no atrial level shunt. LDL 91 HgbA1c 5.0 VTE prophylaxis - lovenox    Diet   Diet Heart Room service appropriate? Yes; Fluid consistency: Thin   No antithrombotic prior to admission, now on aspirin 81 mg daily. No DAPT given possible amyloid angiopathy on MRI  Therapy recommendations:  no PT/OT needs Disposition:  to home  Hypertension Home meds:  losartan 25 mg daily Stable Permissive hypertension (OK if < 220/120) but gradually normalize in 5-7 days Long-term BP goal normotensive  Hyperlipidemia Home meds:  fenofibrate 48 mg daily, 54 mg resumed in hospital LDL 91, goal < 70 Add atorvastatin 20 mg daily High intensity statin deferred due to advanced age Continue statin at discharge   Other Stroke Risk Factors Advanced Age >/= 15  Remote former cigarette smoker Obesity, Body mass index is 32.81 kg/m., BMI >/= 30 associated with increased stroke risk, recommend weight loss, diet and exercise as appropriate  Headaches of unknown etiology, possibly migraines Obstructive  sleep apnea  Other Active Problems History of breast and colon cancer In remission, follow up with outpatient oncologist.  Hospital day # 0    To contact Stroke Continuity provider, please refer to [email or web address] After hours, contact General Neurology

## 2021-03-26 NOTE — Evaluation (Signed)
Occupational Therapy Evaluation Patient Details Name: Crystal Lee MRN: [id number] DOB: 08-31-1933 Today's Date: 03/26/2021   History of Present Illness Pt is a 87 y/o admitted 10/26 with visual changes, R facial droop and R sided weakness. UA concerning for UTI. CT negative, MRI pending. PMH includes: arthritis, breast CA, HTN, osteopenia, sleep apnea, B total hip arthroplasty.   Clinical Impression   PTA patient reports completing ADLs, cooking and driving modified independently, using RW vs cane for mobility as needed.  She was admitted for above and presents near baseline level for ADLS, transfers and in room mobility using RW with assist only for line mgmt. Strength, coordination, vision and cognition appears baseline.  Based on performance today, no further OT needs have been identified and OT will sign off.      Recommendations for follow up therapy are one component of a multi-disciplinary discharge planning process, led by the attending physician.  Recommendations may be updated based on patient status, additional functional criteria and insurance authorization.   Follow Up Recommendations  No OT follow up    Assistance Recommended at Discharge Intermittent Supervision/Assistance  Functional Status Assessment     Equipment Recommendations  None recommended by OT    Recommendations for Other Services       Precautions / Restrictions Precautions Precautions: None Restrictions Weight Bearing Restrictions: No      Mobility Bed Mobility Overal bed mobility: Modified Independent                  Transfers Overall transfer level: Modified independent                        Balance Overall balance assessment: Mild deficits observed, not formally tested                                         ADL either performed or assessed with clinical judgement   ADL Overall ADL's : Modified independent;At baseline                                              Vision Baseline Vision/History: 1 Wears glasses Ability to See in Adequate Light: 0 Adequate Additional Comments: no deficits noted on brief visual assessment     Perception     Praxis      Pertinent Vitals/Pain Pain Assessment: 0-10 Pain Score: 3  Pain Location: generalized Pain Descriptors / Indicators: Discomfort Pain Intervention(s): Limited activity within patient's tolerance;Monitored during session;Repositioned     Hand Dominance Right   Extremity/Trunk Assessment Upper Extremity Assessment Upper Extremity Assessment: Overall WFL for tasks assessed   Lower Extremity Assessment Lower Extremity Assessment: Defer to PT evaluation       Communication Communication Communication: No difficulties   Cognition Arousal/Alertness: Awake/alert Behavior During Therapy: WFL for tasks assessed/performed Overall Cognitive Status: Within Functional Limits for tasks assessed                                 General Comments: pt reports some recent decreased STM, but appears [facility] during assessment     General Comments  VSS    Exercises     Shoulder Instructions      Home  Living Family/patient expects to be discharged to:: Private residence Living Arrangements: Alone Available Help at Discharge: Family;Available PRN/intermittently Type of Home: House Home Access: Level entry     Home Layout: One level     Bathroom Shower/Tub: Walk-in shower;Tub/shower unit   Bathroom Toilet: Handicapped height     Home Equipment: Conservation officer, nature (2 wheels);Rollator (4 wheels);Cane - single point;Shower seat;Grab bars - toilet;Grab bars - tub/shower;BSC          Prior Functioning/Environment Prior Level of Function : Independent/Modified Independent             Mobility Comments: "I just anything I can- a cane, rollator, walker" "I'm just cautious" ADLs Comments: reports independent with ADLs, cooking (family assists with  cleaning), driving        OT Problem List:        OT Treatment/Interventions:      OT Goals(Current goals can be found in the care plan section) Acute Rehab OT Goals Patient Stated Goal: to figure out what happenend OT Goal Formulation: With patient  OT Frequency:     Barriers to D/C:            Co-evaluation              AM-PAC OT "6 Clicks" Daily Activity     Outcome Measure Help from another person eating meals?: None Help from another person taking care of personal grooming?: None Help from another person toileting, which includes using toliet, bedpan, or urinal?: None Help from another person bathing (including washing, rinsing, drying)?: None Help from another person to put on and taking off regular upper body clothing?: None Help from another person to put on and taking off regular lower body clothing?: None 6 Click Score: 24   End of Session Equipment Utilized During Treatment: Rolling walker (2 wheels) Nurse Communication: Mobility status  Activity Tolerance: Patient tolerated treatment well Patient left: in bed;with call bell/phone within reach  OT Visit Diagnosis: Muscle weakness (generalized) (M62.81)                Time: 0354-6568 OT Time Calculation (min): 33 min Charges:  OT General Charges $OT Visit: 1 Visit OT Evaluation $OT Eval Low Complexity: 1 Low OT Treatments $Self Care/Home Management : 8-22 mins  Crystal Lee, OT Acute Rehabilitation Services Pager [phone] Office [phone]   Crystal Lee 03/26/2021, 10:05 AM

## 2021-03-26 NOTE — Progress Notes (Signed)
EEG done at bedside. Results pending. No skin breakdown noted.  

## 2021-03-26 NOTE — Evaluation (Signed)
Physical Therapy Evaluation Patient Details Name: Crystal Lee MRN: [id number] DOB: 17-May-1934 Today's Date: 03/26/2021  History of Present Illness  Pt is a 87 y/o admitted 10/26 with visual changes, R facial droop and R sided weakness. UA concerning for UTI. CT negative, MRI pending. PMH includes: arthritis, breast CA, HTN, osteopenia, sleep apnea, B total hip arthroplasty.  Clinical Impression  Pt is close to baseline functioning and should be safe at home with dtr supervising  PRN in the short-term . There are no further acute PT needs.  Will sign off at this time.        Recommendations for follow up therapy are one component of a multi-disciplinary discharge planning process, led by the attending physician.  Recommendations may be updated based on patient status, additional functional criteria and insurance authorization.  Follow Up Recommendations No PT follow up    Assistance Recommended at Discharge PRN  Functional Status Assessment Patient has had a recent decline in their functional status and/or demonstrates limited ability to make significant improvements in function in a reasonable and predictable amount of time  Equipment Recommendations  None recommended by PT    Recommendations for Other Services       Precautions / Restrictions Precautions Precautions: None      Mobility  Bed Mobility Overal bed mobility: Modified Independent                  Transfers Overall transfer level: Modified independent                      Ambulation/Gait Ambulation/Gait assistance: Modified independent (Device/Increase time) Gait Distance (Feet): 300 Feet Assistive device: Rolling walker (2 wheels) Gait Pattern/deviations: Step-through pattern   Gait velocity interpretation: <1.8 ft/sec, indicate of risk for recurrent falls General Gait Details: functional heel/toe gait pattern, steady with the RW, not overt deviation, drift etc.  Stairs             Wheelchair Mobility    Modified Rankin (Stroke Patients Only)       Balance Overall balance assessment: Mild deficits observed, not formally tested                                           Pertinent Vitals/Pain Pain Assessment: Faces Faces Pain Scale: Hurts a little bit Pain Location: generalized, HA Pain Descriptors / Indicators: Discomfort Pain Intervention(s): Monitored during session    Home Living Family/patient expects to be discharged to:: Private residence Living Arrangements: Alone Available Help at Discharge: Family;Available PRN/intermittently Type of Home: House Home Access: Level entry       Home Layout: One level Home Equipment: Conservation officer, nature (2 wheels);Rollator (4 wheels);Cane - single point;Shower seat;Grab bars - toilet;Grab bars - tub/shower;BSC      Prior Function Prior Level of Function : Independent/Modified Independent               ADLs Comments: reports independent with ADLs, cooking (family assists with cleaning), driving     Hand Dominance   Dominant Hand: Right    Extremity/Trunk Assessment   Upper Extremity Assessment Upper Extremity Assessment: Overall WFL for tasks assessed    Lower Extremity Assessment Lower Extremity Assessment: Overall WFL for tasks assessed (mild proximal weakness bil)       Communication   Communication: No difficulties  Cognition Arousal/Alertness: Awake/alert Behavior During Therapy: WFL for tasks assessed/performed Overall  Cognitive Status: Within Functional Limits for tasks assessed                                 General Comments: pt reports some recent decreased STM, but appears [facility] during assessment        General Comments General comments (skin integrity, edema, etc.): vss    Exercises     Assessment/Plan    PT Assessment Patient does not need any further PT services  PT Problem List         PT Treatment Interventions      PT Goals  (Current goals can be found in the Care Plan section)  Acute Rehab PT Goals Patient Stated Goal: keep being able to get out and about. PT Goal Formulation: All assessment and education complete, DC therapy    Frequency     Barriers to discharge        Co-evaluation               AM-PAC PT "6 Clicks" Mobility  Outcome Measure Help needed turning from your back to your side while in a flat bed without using bedrails?: None Help needed moving from lying on your back to sitting on the side of a flat bed without using bedrails?: None Help needed moving to and from a bed to a chair (including a wheelchair)?: None Help needed standing up from a chair using your arms (e.g., wheelchair or bedside chair)?: None Help needed to walk in hospital room?: None Help needed climbing 3-5 steps with a railing? : A Lot 6 Click Score: 22    End of Session   Activity Tolerance: Patient tolerated treatment well Patient left: in bed;with call bell/phone within reach Nurse Communication: Mobility status PT Visit Diagnosis: Other abnormalities of gait and mobility (R26.89);Unsteadiness on feet (R26.81)    Time: 2426-8341 PT Time Calculation (min) (ACUTE ONLY): 32 min   Charges:   PT Evaluation $PT Eval Moderate Complexity: 1 Mod PT Treatments $Gait Training: 8-22 mins        03/26/2021  Ginger Carne., PT Acute Rehabilitation Services [phone]  (pager) [phone]  (office)  Crystal Lee 03/26/2021, 12:25 PM

## 2021-03-26 NOTE — Plan of Care (Signed)
  Problem: Education: Goal: Knowledge of General Education information will improve Description: Including pain rating scale, medication(s)/side effects and non-pharmacologic comfort measures Outcome: Not Progressing   Problem: Health Behavior/Discharge Planning: Goal: Ability to manage health-related needs will improve Outcome: Not Progressing   Problem: Clinical Measurements: Goal: Ability to maintain clinical measurements within normal limits will improve Outcome: Not Progressing Goal: Will remain free from infection Outcome: Not Progressing Goal: Diagnostic test results will improve Outcome: Not Progressing Goal: Respiratory complications will improve Outcome: Not Progressing Goal: Cardiovascular complication will be avoided Outcome: Not Progressing   Problem: Activity: Goal: Risk for activity intolerance will decrease Outcome: Not Progressing   Problem: Nutrition: Goal: Adequate nutrition will be maintained Outcome: Not Progressing   Problem: Coping: Goal: Level of anxiety will decrease Outcome: Not Progressing   Problem: Elimination: Goal: Will not experience complications related to bowel motility Outcome: Not Progressing Goal: Will not experience complications related to urinary retention Outcome: Not Progressing   Problem: Pain Managment: Goal: General experience of comfort will improve Outcome: Not Progressing   Problem: Education: Goal: Knowledge of patient specific risk factors will improve (INDIVIDUALIZE FOR PATIENT) Outcome: Not Progressing Goal: Individualized Educational Video(s) Outcome: Not Progressing   Problem: Coping: Goal: Will identify appropriate support needs Outcome: Not Progressing

## 2021-03-26 NOTE — Procedures (Signed)
Patient Name: Jamaya Sleeth  MRN: [id number]  Epilepsy Attending: Lora Havens  Referring Physician/Provider: Dr Rosalin Hawking Date: 03/26/2021 Duration: 26.07 mins  Patient history: 87 yo F. She states that yesterday, she was using her phone and noticed that all of the words were running together and that she was unable to read. EEG to evaluate for seizure  Level of alertness: Awake, asleep  AEDs during EEG study: None  Technical aspects: This EEG study was done with scalp electrodes positioned according to the 10-20 International system of electrode placement. Electrical activity was acquired at a sampling rate of 500Hz  and reviewed with a high frequency filter of 70Hz  and a low frequency filter of 1Hz . EEG data were recorded continuously and digitally stored.   Description: The posterior dominant rhythm consists of 8-9 Hz activity of moderate voltage (25-35 uV) seen predominantly in posterior head regions, symmetric and reactive to eye opening and eye closing. Sleep was characterized by vertex waves, sleep spindles (12 to 14 Hz), maximal frontocentral region. Physiologic photic driving was seen during photic stimulation.  Hyperventilation was not performed.     IMPRESSION: This study is within normal limits. No seizures or epileptiform discharges were seen throughout the recording.  Wyman Meschke Barbra Sarks

## 2021-03-26 NOTE — H&P (Signed)
History and Physical    Crystal Lee VPX:[id number] DOB: 04-26-1934 DOA: 03/25/2021  PCP: Darreld Mclean, MD  Patient coming from: Home.  Chief Complaint: Visual disturbances.  HPI: Crystal Lee is a 87 y.o. female with history of hypertension, hyperlipidemia, sleep apnea, breast cancer and colon cancer presently in remission started experiencing difficulty reading right at around 11 AM.  The symptoms lasted for almost 20 minutes.  Patient's daughter came and checked on her and was brought to the ER.  There is also some concern for possible right facial droop and right sided weakness.  ED Course: In the ER neurologist evaluated and felt that patient had mild right facial droop with left upper quadrantanopia.  Recommended admission for further possible stroke work-up.  UA is concerning for UTI EKG shows sinus tachycardia.  COVID test was negative.  Patient was afebrile.  On my exam patient is able to move all extremities.  Review of Systems: As per HPI, rest all negative.   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   PONV (postoperative nausea and vomiting)    PT STATES MORPHINE CAUSED N/V   Rosacea    Sleep apnea    Stage 1 breast cancer, ER+, right ([place]) 07/04/2017    Past Surgical History:  Procedure Laterality Date   5 HOUR [place] STUDY N/A 06/21/2018   Procedure: 24 HOUR [place] STUDY;  Surgeon: Lavena Bullion, DO;  Location: [facility];  Service: Gastroenterology;  Laterality: N/A;  with impedance   ABDOMINAL HYSTERECTOMY  2010   ANAL RECTAL MANOMETRY N/A 09/19/2019   Procedure: ANO RECTAL MANOMETRY;  Surgeon:  Mauri Pole, MD;  Location[facility];  Service: Endoscopy;  Laterality: N/A;   [place]   BIOPSY  06/10/2020   Procedure: BIOPSY;  Surgeon: Lavena Bullion, DO;  Location[facility];  Service: Gastroenterology;;  EGD and COLON   BREAST SURGERY     CATARACT EXTRACTION Bilateral 2011   CHOLECYSTECTOMY     COLON RESECTION N/A 06/12/2020   Procedure: LAPAROSCOPIC COLON RESECTION;  Surgeon: [name], MD;  Location: [facility];  Service: General;  Laterality: N/A;   COLONOSCOPY WITH PROPOFOL N/A 06/10/2020   Procedure: COLONOSCOPY WITH PROPOFOL;  Surgeon: Lavena Bullion, DO;  [facility];  Service: Gastroenterology;  Laterality: N/A;   ESOPHAGEAL MANOMETRY N/A 06/21/2018   Procedure: ESOPHAGEAL MANOMETRY (EM);  Surgeon: Lavena Bullion, DO;  [facility];  Service: Gastroenterology;  Laterality: N/A;   ESOPHAGOGASTRODUODENOSCOPY (EGD) WITH PROPOFOL N/A 06/10/2020   Procedure: ESOPHAGOGASTRODUODENOSCOPY (EGD) WITH PROPOFOL;  Surgeon: Lavena Bullion, DO;  [facility];  Service: Gastroenterology;  Laterality: N/A;   GALLBLADDER SURGERY  2015   JOINT REPLACEMENT     RT TOTAL HIP / RT TOTAL KNEE   MASTECTOMY  1998   BILATERAL    [place] IMPEDANCE STUDY  06/21/2018   Procedure: [place] IMPEDANCE STUDY;  Surgeon: Lavena Bullion, DO;  [facility];  Service: Gastroenterology;;  POLYPECTOMY  06/10/2020   Procedure: POLYPECTOMY;  Surgeon: Lavena Bullion, DO;  [facility];  Service: Gastroenterology;;   SKIN CANCER EXCISION  2016   RT SIDE OF NOSE   SUBMUCOSAL TATTOO INJECTION  06/10/2020   Procedure: SUBMUCOSAL TATTOO INJECTION;  Surgeon: Lavena Bullion, DO;  [facility];  Service: Gastroenterology;;   [place]  2010   RIGHT   TOTAL HIP ARTHROPLASTY Left 05/09/2015   Procedure: LEFT TOTAL HIP ARTHROPLASTY ANTERIOR APPROACH;  Surgeon: Gaynelle Arabian, MD;  [facility];  Service: Orthopedics;  Laterality: Left;   TOTAL KNEE ARTHROPLASTY  2001   TUMOR REMOVAL  2012   ABDOMINAL - NON CANCEROUS     reports that she quit smoking about 45 years ago. Her smoking use included cigarettes. She has a 10.00 pack-year smoking history. She has never used smokeless tobacco. She reports that she does not drink alcohol and does not use drugs.  Allergies  Allergen Reactions   Darvon [Propoxyphene] Nausea And Vomiting and Palpitations    Darvocet Causes Sweats   Adhesive [Tape] Rash   Morphine And Related Nausea And Vomiting    Family History  Problem Relation Age of Onset   Other Mother        complications from flu   Other Father        unsure of cause   Colon cancer Neg Hx     Prior to Admission medications   Medication Sig Start Date End Date Taking? Authorizing Provider  amoxicillin-clavulanate (AUGMENTIN) 500-125 MG tablet Take 1 tablet (500 mg total) by mouth 2 (two) times daily. 02/26/21   Copland, Gay Filler, MD  cyclobenzaprine (FLEXERIL) 5 MG tablet Take 1 tablet (5 mg total) by mouth at bedtime as needed for muscle spasms. 11/11/20   Bayard Hugger, NP  diphenoxylate-atropine (LOMOTIL) 2.5-0.025 MG tablet Take 1 tablet by mouth 4 (four) times daily as needed for diarrhea or loose stools. 10/16/20   Celso Amy, NP  estradiol (ESTRACE VAGINAL) 0.1 MG/GM vaginal cream Apply 1gm vaginally 1-3x a week as needed for comfort 09/22/20   Copland, Gay Filler, MD  fenofibrate (TRICOR) 48 MG tablet Take 1 tablet (48 mg total) by mouth daily. 09/22/20   Copland, Gay Filler, MD  HYDROcodone-acetaminophen (NORCO) 10-325 MG tablet Take 1 tablet by mouth every 6 (six) hours as needed. 03/05/21   Bayard Hugger, NP  ipratropium (ATROVENT) 0.03 % nasal spray Place 2 sprays into both nostrils 4 (four) times daily as needed for rhinitis. 09/22/20   Copland, Gay Filler, MD  loperamide (IMODIUM A-D) 2 MG tablet Take 2 mg by mouth 4 (four) times  daily as needed for diarrhea or loose stools. 10/12/20   [provider]  losartan (COZAAR) 25 MG tablet Take 1 tablet (25 mg total) by mouth daily. 09/22/20   Copland, Gay Filler, MD  Multiple Vitamins-Minerals (MULTIVITAMIN ADULTS) TABS Take 1 tablet by mouth daily.    [provider]  naloxegol oxalate (MOVANTIK) 12.5 MG TABS tablet Take 1 tablet (12.5 mg total) by mouth daily. 07/07/20   Cirigliano, Vito V, DO  neomycin-polymyxin b-dexamethasone (MAXITROL) 3.5-10000-0.1 SUSP Place 1 drop into the left eye daily as needed (dye eye). 09/26/20   [provider]  phenazopyridine (PYRIDIUM) 200 MG tablet Take 1 tablet (200 mg total) by mouth 3 (three) times daily as needed for pain. 01/07/21   Copland, Gay Filler, MD  promethazine (PHENERGAN) 12.5 MG tablet  Take 1 tablet (12.5 mg total) by mouth every 6 (six) hours as needed for nausea or vomiting. 07/07/20   Cirigliano, Vito V, DO  valACYclovir (VALTREX) 1000 MG tablet Take 1,000 mg by mouth daily. 10/03/20   [provider]    Physical Exam: Constitutional: Moderately built and nourished. Vitals:   03/25/21 1730 03/25/21 1853 03/25/21 2155 03/25/21 2346  BP: 126/66 (!) 149/60 (!) 149/68   Pulse: 67 70 72 64  Resp: (!) 22 20 19    Temp:  98.4 F (36.9 C)  97.9 F (36.6 C)  TempSrc:  Oral  Oral  SpO2: 94% 99% 96%   Weight:    92.2 kg  Height:    5\' 6"  (1.676 m)   Eyes: Anicteric no pallor. ENMT: No discharge from the ears eyes nose and mouth. Neck: No mass felt.  No neck rigidity. Respiratory: No rhonchi or crepitations. Cardiovascular: S1-S2 heard. Abdomen: Soft nontender bowel sound present. Musculoskeletal: No edema. Skin: No rash. Neurologic: Alert awake oriented to time place and person.  Moving all extremities 5 x 5.  No facial asymmetry tongue is midline pupils equal reacting to light. Psychiatric: Appears normal.  Normal affect.   Labs on Admission: I have personally reviewed following labs and  imaging studies  CBC: Recent Labs  Lab 03/25/21 1418  WBC 6.8  NEUTROABS 3.8  HGB 13.4  HCT 41.0  MCV 91.1  PLT 378*   Basic Metabolic Panel: Recent Labs  Lab 03/25/21 1418  NA 136  K 3.8  CL 103  CO2 25  GLUCOSE 103*  BUN 14  CREATININE 0.59  CALCIUM 9.4   GFR: Estimated Creatinine Clearance: 58.8 mL/min (by C-G formula based on SCr of 0.59 mg/dL). Liver Function Tests: Recent Labs  Lab 03/25/21 1418  AST 23  ALT 18  ALKPHOS 87  BILITOT 0.4  PROT 7.9  ALBUMIN 4.1   No results for input(s): LIPASE, AMYLASE in the last 168 hours. No results for input(s): AMMONIA in the last 168 hours. Coagulation Profile: Recent Labs  Lab 03/25/21 1418  INR 1.0   Cardiac Enzymes: No results for input(s): CKTOTAL, CKMB, CKMBINDEX, TROPONINI in the last 168 hours. BNP (last 3 results) No results for input(s): PROBNP in the last 8760 hours. HbA1C: No results for input(s): HGBA1C in the last 72 hours. CBG: Recent Labs  Lab 03/25/21 1412  GLUCAP 95   Lipid Profile: No results for input(s): CHOL, HDL, LDLCALC, TRIG, CHOLHDL, LDLDIRECT in the last 72 hours. Thyroid Function Tests: No results for input(s): TSH, T4TOTAL, FREET4, T3FREE, THYROIDAB in the last 72 hours. Anemia Panel: No results for input(s): VITAMINB12, FOLATE, FERRITIN, TIBC, IRON, RETICCTPCT in the last 72 hours. Urine analysis:    Component Value Date/Time   COLORURINE YELLOW 03/25/2021 1418   APPEARANCEUR CLOUDY (A) 03/25/2021 1418   LABSPEC 1.015 03/25/2021 1418   PHURINE 6.0 03/25/2021 1418   GLUCOSEU NEGATIVE 03/25/2021 1418   GLUCOSEU NEGATIVE 04/20/2019 1017   HGBUR SMALL (A) 03/25/2021 1418   BILIRUBINUR NEGATIVE 03/25/2021 1418   BILIRUBINUR negative 01/05/2021 Lee 03/25/2021 1418   PROTEINUR NEGATIVE 03/25/2021 1418   UROBILINOGEN 0.2 01/05/2021 1439   UROBILINOGEN 2.0 (A) 04/20/2019 1017   NITRITE POSITIVE (A) 03/25/2021 1418   LEUKOCYTESUR LARGE (A) 03/25/2021  1418   Sepsis Labs: @LABRCNTIP (procalcitonin:4,lacticidven:4) ) Recent Results (from the past 240 hour(s))  Resp Panel by RT-PCR (Flu A&B, Covid) Nasopharyngeal Swab     Status: None   Collection Time: 03/25/21  2:34 PM   Specimen: Nasopharyngeal Swab; Nasopharyngeal(NP) swabs in vial transport medium  Result Value Ref Range Status   SARS Coronavirus 2 by RT PCR NEGATIVE NEGATIVE Final    Comment: (NOTE) SARS-CoV-2 target nucleic acids are NOT DETECTED.  The SARS-CoV-2 RNA is generally detectable in upper respiratory specimens during the acute phase of infection. The lowest concentration of SARS-CoV-2 viral copies this assay can detect is 138 copies/mL. A negative result does not preclude SARS-Cov-2 infection and should not be used as the sole basis for treatment or other patient management decisions. A negative result may occur with  improper specimen collection/handling, submission of specimen other than nasopharyngeal swab, presence of viral mutation(s) within the areas targeted by this assay, and inadequate number of viral copies(<138 copies/mL). A negative result must be combined with clinical observations, patient history, and epidemiological information. The expected result is Negative.  Fact Sheet for Patients:  EntrepreneurPulse.com.au  Fact Sheet for Healthcare Providers:  IncredibleEmployment.be  This test is no t yet approved or cleared by the [place] FDA and  has been authorized for detection and/or diagnosis of SARS-CoV-2 by FDA under an Emergency Use Authorization (EUA). This EUA will remain  in effect (meaning this test can be used) for the duration of the COVID-19 declaration under Section 564(b)(1) of the Act, 21 U.S.C.section 360bbb-3(b)(1), unless the authorization is terminated  or revoked sooner.       Influenza A by PCR NEGATIVE NEGATIVE Final   Influenza B by PCR NEGATIVE NEGATIVE Final    Comment:  (NOTE) The Xpert Xpress SARS-CoV-2/FLU/RSV plus assay is intended as an aid in the diagnosis of influenza from Nasopharyngeal swab specimens and should not be used as a sole basis for treatment. Nasal washings and aspirates are unacceptable for Xpert Xpress SARS-CoV-2/FLU/RSV testing.  Fact Sheet for Patients: EntrepreneurPulse.com.au  Fact Sheet for Healthcare Providers: IncredibleEmployment.be  This test is not yet approved or cleared by the [place] FDA and has been authorized for detection and/or diagnosis of SARS-CoV-2 by FDA under an Emergency Use Authorization (EUA). This EUA will remain in effect (meaning this test can be used) for the duration of the COVID-19 declaration under Section 564(b)(1) of the Act, 21 U.S.C. section 360bbb-3(b)(1), unless the authorization is terminated or revoked.  Performed at [facility], [address] [place], [place] [address]      Radiological Exams on Admission: CT HEAD CODE STROKE WO CONTRAST  Result Date: 03/25/2021 CLINICAL DATA:  Code stroke. Neuro deficit, acute, stroke suspected. Slurred speech and right-sided weakness beginning 1100 hours. EXAM: CT HEAD WITHOUT CONTRAST TECHNIQUE: Contiguous axial images were obtained from the base of the skull through the vertex without intravenous contrast. COMPARISON:  None. FINDINGS: Brain: No evidence of accelerated atrophy. Minimal small vessel change of the cerebral hemispheric white matter. No sign of acute infarction, mass lesion, hemorrhage, hydrocephalus or extra-axial collection. Vascular: There is atherosclerotic calcification of the major vessels at the base of the brain. Skull: Negative Sinuses/Orbits: Clear/normal Other: None ASPECTS ([place] Stroke Program Early CT Score) - Ganglionic level infarction (caudate, lentiform nuclei, internal capsule, insula, M1-M3 cortex): 7 - Supraganglionic infarction (M4-M6 cortex): 3 Total score (0-10  with 10 being normal): 10 IMPRESSION: 1. No acute finding. Mild chronic small-vessel ischemic change of the white matter. 2. There is atherosclerotic calcification of the major vessels at the base of the brain. 3. ASPECTS is 10 4. These results were communicated to Dr. Quinn Axe at 2:32 pm on 03/25/2021 by text  page via the [facility] messaging system. Electronically Signed   By: Nelson Chimes M.D.   On: 03/25/2021 14:33    EKG: Independently reviewed.  Sinus tachycardia.  Assessment/Plan Principal Problem:   Stroke-like symptoms Active Problems:   Malignant neoplasm of descending colon (HCC)   Hypertension    Possible stroke versus TIA appreciate neurology consult.  We will get CT angiogram of the head and neck 2D echo.  Patient did pass stroke swallow.  Presently on aspirin.  Check hemoglobin A1c and lipid panel.  Neurochecks.  Physical therapy consult.  Further recommendations per neurologist. Hypertension -allow for permissive hypertension.  Holding ARB.  As needed IV hydralazine for systolic more than 588. History of breast cancer and colon cancer in remission. Thrombocytopenia appears to be chronic. Hyperlipidemia Home medicine needs to be verified.   DVT prophylaxis: Lovenox. Code Status: Full code. Family Communication: Discussed with patient. Disposition Plan: Home. Consults called: Neurology. Admission status: Observation.   Rise Patience MD [facility] Pager [phone].  If 7PM-7AM, please contact night-coverage [email or web address] Password [email or web address]  03/26/2021, 1:36 AM

## 2021-03-26 NOTE — Progress Notes (Signed)
PROGRESS NOTE    Crystal Lee  LOV:[id number] DOB: May 31, 1934 DOA: 03/25/2021 PCP: Darreld Mclean, MD    Brief Narrative:  Mrs. Seat was admitted to the hospital with the working diagnosis of possible CVA/ TIA.   87 year old female past medical history for hypertension, dyslipidemia, sleep apnea, and breast /colon cancer who presented with visual disturbances.  She noticed difficulty reading around 11 AM on the day of her hospitalization, her daughter brought her to the ED.  On her initial physical examination she had mild right facial droop and left upper quadrantanopsia.  Her blood pressure was 126/66, heart rate 70, respirate 22, temperature 98.4, oxygen saturation 94%.  Her lungs were clear to auscultation bilaterally, heart S1-S2, present, rhythmic, soft abdomen, no lower extremity edema.  Sodium 136, potassium 3.8, chloride 103, bicarb 25, glucose 103, BUN 14, creatinine 0.59, white count 6.8, hemoglobin 13.4, hematocrit 41.0, platelets 141. SARS COVID-19 negative.  Urinalysis specific gravity 1.015, positive nitrates, 11-20 red cells, 21-50 white cells.  Head CT no acute findings.  Mild chronic small vessel ischemic change of the white matter.  Atherosclerotic calcification of the major vessels at the base of the brain.  EKG 101 bpm, left axis deviation, normal intervals, sinus rhythm, no significant ST segment or T wave changes.  Further work-up with CT angiography head and neck with negative CTA for large vessel occlusion.  Mild for age atheromatous change about the major arterial vasculature of the head and neck.  No hemodynamically significant or correctable stenosis.  Assessment & Plan:   Principal Problem:   Stroke-like symptoms Active Problems:   Malignant neoplasm of descending colon (HCC)   Hypertension   Acute CVA/ TIA. Neuro deficient has resolved this am. No further right facial droop. Strength preserved bilaterally.  Lipid profile with LDL 91, HDL 36,  total cholesterol 144 and triglycerides 83.  Echocardiogram with LV EF 55 to 88%, RV systolic function preserved. No significant valvular disease.   Plan to continue medical therapy with aspiring and resume losartan for blood pressure control. Continue fenofibrate and add atorvastatin 40 mg. Follow with brain MRI, PT, OT and neurology recommendations.    2. Urinary tract infection. Change to oral antibiotic therapy with bactrim for 3 days.   3. HTN. Continue blood pressure control with losartan.   4. Obesity class 1. Calculated BMI is 32,8    Patient continue to be at high risk for worsening or recurrent TIA   Status is: Observation  The patient remains OBS appropriate and will d/c before 2 midnights.  DVT prophylaxis: Enoxaparin   Code Status:   full  Family Communication:  no family at the bedside        Consultants:  Neurology     Subjective: Patient with no nausea or vomiting, her neurologic deficit has improved, patient able to read with no difficulty.   Objective: Vitals:   03/25/21 2155 03/25/21 2346 03/26/21 0314 03/26/21 0748  BP: (!) 149/68  126/68 (!) 148/50  Pulse: 72 64 65 65  Resp: 19  20 (!) 21  Temp:  97.9 F (36.6 C) 97.7 F (36.5 C)   TempSrc:  Oral Oral   SpO2: 96%  96% 100%  Weight:  92.2 kg    Height:  5\' 6"  (1.676 m)      Intake/Output Summary (Last 24 hours) at 03/26/2021 0920 Last data filed at 03/26/2021 0521 Gross per 24 hour  Intake 225 ml  Output 800 ml  Net -575 ml   [employer]  03/25/21 2346  Weight: 92.2 kg    Examination:   General: Not in pain or dyspnea, Neurology: Awake and alert, non focal  E ENT: no pallor, no icterus, oral mucosa moist Cardiovascular: No JVD. S1-S2 present, rhythmic, no gallops, rubs, or murmurs. No lower extremity edema. Pulmonary: positive breath sounds bilaterally, adequate air movement, no wheezing, rhonchi or rales. Gastrointestinal. Abdomen soft and non tender\ Skin. No  rashes Musculoskeletal: no joint deformities     Data Reviewed: I have personally reviewed following labs and imaging studies  CBC: Recent Labs  Lab 03/25/21 1418 03/26/21 0242  WBC 6.8 6.3  NEUTROABS 3.8  --   HGB 13.4 12.6  HCT 41.0 38.0  MCV 91.1 91.1  PLT 141* 379*   Basic Metabolic Panel: Recent Labs  Lab 03/25/21 1418 03/26/21 0242  NA 136  --   K 3.8  --   CL 103  --   CO2 25  --   GLUCOSE 103*  --   BUN 14  --   CREATININE 0.59 0.72  CALCIUM 9.4  --    GFR: Estimated Creatinine Clearance: 58.8 mL/min (by C-G formula based on SCr of 0.72 mg/dL). Liver Function Tests: Recent Labs  Lab 03/25/21 1418  AST 23  ALT 18  ALKPHOS 87  BILITOT 0.4  PROT 7.9  ALBUMIN 4.1   No results for input(s): LIPASE, AMYLASE in the last 168 hours. No results for input(s): AMMONIA in the last 168 hours. Coagulation Profile: Recent Labs  Lab 03/25/21 1418  INR 1.0   Cardiac Enzymes: No results for input(s): CKTOTAL, CKMB, CKMBINDEX, TROPONINI in the last 168 hours. BNP (last 3 results) No results for input(s): PROBNP in the last 8760 hours. HbA1C: Recent Labs    03/26/21 0242  HGBA1C 5.0   CBG: Recent Labs  Lab 03/25/21 1412  GLUCAP 95   Lipid Profile: Recent Labs    03/26/21 0242  CHOL 144  HDL 36*  LDLCALC 91  TRIG 83  CHOLHDL 4.0   Thyroid Function Tests: No results for input(s): TSH, T4TOTAL, FREET4, T3FREE, THYROIDAB in the last 72 hours. Anemia Panel: No results for input(s): VITAMINB12, FOLATE, FERRITIN, TIBC, IRON, RETICCTPCT in the last 72 hours.    Radiology Studies: I have reviewed all of the imaging during this hospital visit personally     Scheduled Meds:  aspirin  300 mg Rectal Daily   Or   aspirin  325 mg Oral Daily   enoxaparin (LOVENOX) injection  40 mg Subcutaneous Q24H   Continuous Infusions:  sodium chloride 75 mL/hr at 03/26/21 0341   cefTRIAXone (ROCEPHIN)  IV       LOS: 0 days        Crystal Lee Gerome Apley, MD

## 2021-03-26 NOTE — Progress Notes (Signed)
SLP Cancellation Note  Patient Details Name: Crystal Lee MRN: [id number] DOB: 09-14-1933   Cancelled treatment:       Reason Eval/Treat Not Completed: SLP screened, no needs identified, will sign off Pt did not present with any acute speech, language, or cognitive-linguistic deficits on admission, MRI was negative for acute changes, and no overt communication or cognitive-linguistic deficits were noted by physical therapy/occupational therapy. A formal evaluation does not appear to be clinically indicated at this time. SLP will sign off.  Demiya Magno I. Hardin Negus, [place], [place] Office number [phone] Pager [place] 03/26/2021, 2:11 PM

## 2021-03-27 DIAGNOSIS — R531 Weakness: Secondary | ICD-10-CM | POA: Diagnosis not present

## 2021-03-27 DIAGNOSIS — R2990 Unspecified symptoms and signs involving the nervous system: Secondary | ICD-10-CM | POA: Diagnosis not present

## 2021-03-27 LAB — BASIC METABOLIC PANEL
Anion gap: 5 (ref 5–15)
BUN: 12 mg/dL (ref 8–23)
CO2: 24 mmol/L (ref 22–32)
Calcium: 9.4 mg/dL (ref 8.9–10.3)
Chloride: 107 mmol/L (ref 98–111)
Creatinine, Ser: 0.68 mg/dL (ref 0.44–1.00)
GFR, Estimated: >60 mL/min (ref >60)
Glucose, Bld: 93 mg/dL (ref 70–99)
Potassium: 3.9 mmol/L (ref 3.5–5.1)
Sodium: 136 mmol/L (ref 135–145)

## 2021-03-27 LAB — URINE CULTURE: Culture: >=100000 — AB

## 2021-03-27 MED ORDER — ATORVASTATIN CALCIUM 20 MG PO TABS: 20.0000 mg | ORAL_TABLET | Freq: Every day | ORAL | 0 refills | Status: DC

## 2021-03-27 MED ORDER — CEPHALEXIN 500 MG PO CAPS: 500.0000 mg | ORAL_CAPSULE | Freq: Once | ORAL | Status: DC

## 2021-03-27 MED ORDER — SULFAMETHOXAZOLE-TRIMETHOPRIM 800-160 MG PO TABS: 1.0000 | ORAL_TABLET | Freq: Two times a day (BID) | ORAL | 0 refills | Status: DC

## 2021-03-27 MED ORDER — CEPHALEXIN 500 MG PO CAPS: 500.0000 mg | ORAL_CAPSULE | Freq: Two times a day (BID) | ORAL | 0 refills | Status: AC

## 2021-03-27 MED ORDER — PHENAZOPYRIDINE HCL 200 MG PO TABS: 200.0000 mg | ORAL_TABLET | Freq: Three times a day (TID) | ORAL | 0 refills | Status: DC | PRN

## 2021-03-27 MED ORDER — PHENAZOPYRIDINE HCL 100 MG PO TABS
100.0000 mg | ORAL_TABLET | Freq: Three times a day (TID) | ORAL | Status: DC
  Filled 2021-03-27: qty 1

## 2021-03-27 NOTE — Care Management Obs Status (Signed)
[place] NOTIFICATION   Patient Details  Name: Analise Glotfelty MRN: [id number] Date of Birth: 1933/11/04   Medicare Observation Status Notification Given:  Yes    Joanne Chars, LCSW 03/27/2021, 9:32 AM

## 2021-03-27 NOTE — Discharge Summary (Addendum)
Discharge Summary  Crystal Lee BTD:[id number] DOB: 28-Jan-1934  PCP: Darreld Mclean, MD  Admit date: 03/25/2021 Discharge date: 03/27/2021  Time spent: 58mins, more than 50% time spent on coordination of care. Case discussed  with neurology, pcp prior to discharge  Updated daughter over  the phone  Recommendations for Outpatient Follow-up:  F/u with PCP Dr Lorelei Pont on 11/2, pcp will follow up on final urine culture result, adjust abx if needed, pcp to refer patient to urogyeocolgist  F/u with neurology Dr Krista Blue  Addendum:  Urine culture came back later in the afternoon, it was ecoli that is  resistant to bactrim, but sensitive to cefazolin , new abx called to her pharmacy :keflex 500mg  bid x5 days  Daughter updated over the phone regarding abx changes   Discharge Diagnoses:  Active Hospital Problems   Diagnosis Date Noted   Stroke-like symptoms 03/25/2021   Hypertension    Malignant neoplasm of descending colon [facility])     Resolved Hospital Problems  No resolved problems to display.    Discharge Condition: stable  Diet recommendation: heart healthy  Filed Weights   03/25/21 2346  Weight: 92.2 kg    History of present illness: ( per admitting MDDr Hal Hope) Chief Complaint: Visual disturbances.   HPI: Crystal Lee is a 87 y.o. female with history of hypertension, hyperlipidemia, sleep apnea, breast cancer and colon cancer presently in remission started experiencing difficulty reading right at around 11 AM.  The symptoms lasted for almost 20 minutes.  Patient's daughter came and checked on her and was brought to the ER.  There is also some concern for possible right facial droop and right sided weakness.   ED Course: In the ER neurologist evaluated and felt that patient had mild right facial droop with left upper quadrantanopia.  Recommended admission for further possible stroke work-up.  UA is concerning for UTI EKG shows sinus tachycardia.  COVID test was negative.   Patient was afebrile.  On my exam patient is able to move all extremities.    Hospital Course:  Principal Problem:   Stroke-like symptoms Active Problems:   Malignant neoplasm of descending colon (HCC)   Hypertension  Visual disturbances MRI did not show acute CVA, did show amyloid angiopathy EEG no seizure seen by neurologist "Recommend aspirin 81 on discharge, no DAPT given concerning for amyloid angiopathy.  Added Lipitor 20, continue on discharge.  Recommend long-term BP less than 140 given concerning for amyloid angiopathy" F/u with neurology in 4weeks  HTN:  Continue home meds, long term bp goal, sbp less than 140 F/u with pcp  HLD, continue fenofibrate, started lipitor 20mg  daily per neurology recommendation Monitor lft, f/u with pcp  Frequent UTI She is currently having UTI with dysuria, urinary urgency and frequency, urine culture in process, she desires to go home, case discussed with pcp who is going to follow up on final urine culture result over the weekend, will discharge home on bactrim for now She also request pyridium , prescribed Patient and daughter both desires discharge today, they are aware of following up with pcp over the weekend for culture result,  I also advised patient seek medical care if she develop fever or does not feel well over the weekend.    Pm Addendum:  Urine culture came back later in the afternoon, it was ecoli that is  resistant to bactrim, but sensitive to cefazolin , new abx called to her pharmacy :keflex 500mg  bid x5 days  Daughter updated over the phone regarding  abx changes   Procedures: EEG  Consultations: Neurology Pcp   Discharge Exam: BP (!) 133/54 (BP Location: Right Arm)   Pulse 72   Temp 97.8 F (36.6 C) (Oral)   Resp (!) 22   Ht 5\' 6"  (1.676 m)   Wt 92.2 kg   SpO2 95%   BMI 32.81 kg/m   General: frail elderly, NAD. AAOX3 Cardiovascular: RRR Respiratory: normal respiratory effort   Discharge  Instructions You were cared for by a hospitalist during your hospital stay. If you have any questions about your discharge medications or the care you received while you were in the hospital after you are discharged, you can call the unit and asked to speak with the hospitalist on call if the hospitalist that took care of you is not available. Once you are discharged, your primary care physician will handle any further medical issues. Please note that NO REFILLS for any discharge medications will be authorized once you are discharged, as it is imperative that you return to your primary care physician (or establish a relationship with a primary care physician if you do not have one) for your aftercare needs so that they can reassess your need for medications and monitor your lab values.  Discharge Instructions     Ambulatory referral to Neurology   Complete by: As directed    Follow up with Dr. Krista Blue at [facility] in 4-6 weeks. Pt is Dr. Rhea Belton pt. Thanks.   Diet - low sodium heart healthy   Complete by: As directed    Increase activity slowly   Complete by: As directed       Allergies as of 03/27/2021       Reactions   Darvon [propoxyphene] Nausea And Vomiting, Palpitations   Darvocet Causes Sweats   Adhesive [tape] Rash   Morphine And Related Nausea And Vomiting        Medication List     STOP taking these medications    amoxicillin-clavulanate 500-125 MG tablet Commonly known as: Augmentin       TAKE these medications    aspirin EC 81 MG tablet Take 81 mg by mouth once. Swallow whole.   atorvastatin 20 MG tablet Commonly known as: LIPITOR Take 1 tablet (20 mg total) by mouth daily. Start taking on: March 28, 2021   cyclobenzaprine 5 MG tablet Commonly known as: FLEXERIL Take 1 tablet (5 mg total) by mouth at bedtime as needed for muscle spasms.   diphenoxylate-atropine 2.5-0.025 MG tablet Commonly known as: LOMOTIL Take 1 tablet by mouth 4 (four) times daily as needed  for diarrhea or loose stools.   estradiol 0.1 MG/GM vaginal cream Commonly known as: ESTRACE VAGINAL Apply 1gm vaginally 1-3x a week as needed for comfort What changed:  how much to take how to take this when to take this additional instructions   fenofibrate 48 MG tablet Commonly known as: Tricor Take 1 tablet (48 mg total) by mouth daily.   HYDROcodone-acetaminophen 10-325 MG tablet Commonly known as: NORCO Take 1 tablet by mouth every 6 (six) hours as needed. What changed: reasons to take this   losartan 25 MG tablet Commonly known as: COZAAR Take 1 tablet (25 mg total) by mouth daily.   Multivitamin Adults Tabs Take 1 tablet by mouth daily.   naloxegol oxalate 12.5 MG Tabs tablet Commonly known as: MOVANTIK Take 1 tablet (12.5 mg total) by mouth daily.   phenazopyridine 200 MG tablet Commonly known as: Pyridium Take 1 tablet (200 mg total) by  mouth 3 (three) times daily as needed for pain.   promethazine 12.5 MG tablet Commonly known as: PHENERGAN Take 1 tablet (12.5 mg total) by mouth every 6 (six) hours as needed for nausea or vomiting.   sulfamethoxazole-trimethoprim 800-160 MG tablet Commonly known as: BACTRIM DS Take 1 tablet by mouth every 12 (twelve) hours for 5 days.       Allergies  Allergen Reactions   Darvon [Propoxyphene] Nausea And Vomiting and Palpitations    Darvocet Causes Sweats   Adhesive [Tape] Rash   Morphine And Related Nausea And Vomiting    Follow-up Information     Marcial Pacas, MD. Schedule an appointment as soon as possible for a visit in 1 month(s).   Specialty: Neurology Contact information: [address] [place] [address] [phone]         Darreld Mclean, MD Follow up on 04/01/2021.   Specialty: Family Medicine Why: at 3:20 pm Contact information: [place] [address] [phone]                  The results of significant diagnostics from this  hospitalization (including imaging, microbiology, ancillary and laboratory) are listed below for reference.    Significant Diagnostic Studies: CT ANGIO HEAD W OR WO CONTRAST  Result Date: 03/26/2021 CLINICAL DATA:  Follow-up examination for stroke. EXAM: CT ANGIOGRAPHY HEAD AND NECK TECHNIQUE: Multidetector CT imaging of the head and neck was performed using the standard protocol during bolus administration of intravenous contrast. Multiplanar CT image reconstructions and MIPs were obtained to evaluate the vascular anatomy. Carotid stenosis measurements (when applicable) are obtained utilizing NASCET criteria, using the distal internal carotid diameter as the denominator. CONTRAST:  73mL OMNIPAQUE IOHEXOL 350 MG/ML SOLN COMPARISON:  CT from 03/25/2021. FINDINGS: CT HEAD FINDINGS Brain: Cerebral volume within normal limits for age. Mild chronic small vessel ischemic disease. No acute intracranial hemorrhage. No visible acute large vessel territory infarct. No mass lesion, mass effect or midline shift. No hydrocephalus or extra-axial fluid collection. Vascular: No hyperdense vessel. Scattered vascular calcifications noted within the carotid siphons. Skull: No new finding. Sinuses: Clear. Orbits: No acute finding.  Senescent calcifications noted. Review of the MIP images confirms the above findings CTA NECK FINDINGS Aortic arch: Visualized aortic arch normal in caliber with normal branch pattern. Mild-to-moderate atheromatous change about the arch and origin of the great vessels without stenosis. Right carotid system: Right common and internal carotid arteries patent without stenosis or other acute vascular abnormality. Mild for age plaque about the right carotid bulb without stenosis. Left carotid system: Left common and internal carotid arteries patent without stenosis or other acute vascular abnormality. Mild for age plaque about the left carotid bulb without stenosis. Vertebral arteries: Both vertebral  arteries arise from subclavian arteries. Atheromatous plaque about the origins of both vertebral arteries without significant stenosis. Vertebral arteries patent distally without stenosis or other acute vascular abnormality. Skeleton: No worrisome osseous lesions.  Mild for age spondylosis. Other neck: No other soft tissue abnormality within the neck. Upper chest: Visualized upper chest demonstrates no acute finding. Review of the MIP images confirms the above findings CTA HEAD FINDINGS Anterior circulation: Petrous segments patent bilaterally. Mild atheromatous plaque within the carotid siphons without significant stenosis. A1 segments widely patent. Normal anterior communicating artery complex. Anterior cerebral arteries patent without stenosis. No M1 stenosis or occlusion. Distal MCA branches well perfused and symmetric. Posterior circulation: Vertebral arteries patent the vertebrobasilar junction without stenosis. Both  PICA origins grossly patent and normal. Basilar patent without stenosis. Superior cerebellar arteries patent bilaterally. Both PCAs primarily supplied via the basilar well perfused or distal aspects. Venous sinuses: Grossly patent allowing for timing the contrast bolus. Anatomic variants: None significant. Review of the MIP images confirms the above findings IMPRESSION: 1. Negative CTA for large vessel occlusion. 2. Mild for age atheromatous change about the major arterial vasculature of the head and neck as above without hemodynamically significant or correctable stenosis. 3. No other new acute intracranial abnormality. Electronically Signed   By: Jeannine Boga M.D.   On: 03/26/2021 03:22   CT ANGIO NECK W OR WO CONTRAST  Result Date: 03/26/2021 CLINICAL DATA:  Follow-up examination for stroke. EXAM: CT ANGIOGRAPHY HEAD AND NECK TECHNIQUE: Multidetector CT imaging of the head and neck was performed using the standard protocol during bolus administration of intravenous contrast.  Multiplanar CT image reconstructions and MIPs were obtained to evaluate the vascular anatomy. Carotid stenosis measurements (when applicable) are obtained utilizing NASCET criteria, using the distal internal carotid diameter as the denominator. CONTRAST:  13mL OMNIPAQUE IOHEXOL 350 MG/ML SOLN COMPARISON:  CT from 03/25/2021. FINDINGS: CT HEAD FINDINGS Brain: Cerebral volume within normal limits for age. Mild chronic small vessel ischemic disease. No acute intracranial hemorrhage. No visible acute large vessel territory infarct. No mass lesion, mass effect or midline shift. No hydrocephalus or extra-axial fluid collection. Vascular: No hyperdense vessel. Scattered vascular calcifications noted within the carotid siphons. Skull: No new finding. Sinuses: Clear. Orbits: No acute finding.  Senescent calcifications noted. Review of the MIP images confirms the above findings CTA NECK FINDINGS Aortic arch: Visualized aortic arch normal in caliber with normal branch pattern. Mild-to-moderate atheromatous change about the arch and origin of the great vessels without stenosis. Right carotid system: Right common and internal carotid arteries patent without stenosis or other acute vascular abnormality. Mild for age plaque about the right carotid bulb without stenosis. Left carotid system: Left common and internal carotid arteries patent without stenosis or other acute vascular abnormality. Mild for age plaque about the left carotid bulb without stenosis. Vertebral arteries: Both vertebral arteries arise from subclavian arteries. Atheromatous plaque about the origins of both vertebral arteries without significant stenosis. Vertebral arteries patent distally without stenosis or other acute vascular abnormality. Skeleton: No worrisome osseous lesions.  Mild for age spondylosis. Other neck: No other soft tissue abnormality within the neck. Upper chest: Visualized upper chest demonstrates no acute finding. Review of the MIP images  confirms the above findings CTA HEAD FINDINGS Anterior circulation: Petrous segments patent bilaterally. Mild atheromatous plaque within the carotid siphons without significant stenosis. A1 segments widely patent. Normal anterior communicating artery complex. Anterior cerebral arteries patent without stenosis. No M1 stenosis or occlusion. Distal MCA branches well perfused and symmetric. Posterior circulation: Vertebral arteries patent the vertebrobasilar junction without stenosis. Both PICA origins grossly patent and normal. Basilar patent without stenosis. Superior cerebellar arteries patent bilaterally. Both PCAs primarily supplied via the basilar well perfused or distal aspects. Venous sinuses: Grossly patent allowing for timing the contrast bolus. Anatomic variants: None significant. Review of the MIP images confirms the above findings IMPRESSION: 1. Negative CTA for large vessel occlusion. 2. Mild for age atheromatous change about the major arterial vasculature of the head and neck as above without hemodynamically significant or correctable stenosis. 3. No other new acute intracranial abnormality. Electronically Signed   By: Jeannine Boga M.D.   On: 03/26/2021 03:22   MR BRAIN WO CONTRAST  Result Date: 03/26/2021  CLINICAL DATA:  Neuro deficit, acute, stroke suspected EXAM: MRI HEAD WITHOUT CONTRAST TECHNIQUE: Multiplanar, multiecho pulse sequences of the brain and surrounding structures were obtained without intravenous contrast. COMPARISON:  CT head 03/25/2021. FINDINGS: Brain: No acute infarction, hemorrhage, hydrocephalus, extra-axial collection or mass lesion. Mild to moderate scattered T2 hyperintensities in the white matter, nonspecific but compatible with chronic microvascular ischemic disease. Numerous tiny scattered foci of susceptibility artifact throughout the cortex bilaterally, greatest in the parieto-occipital lobes. Vascular: Major arterial flow voids are maintained skull base. Skull  and upper cervical spine: Normal marrow signal. Sinuses/Orbits: Clear sinuses.  Unremarkable orbits. Other: Trace bilateral mastoid fluid. IMPRESSION: 1. No evidence of acute intracranial abnormality, including infarct. 2. Numerous tiny scattered foci of susceptibility artifact throughout the cortex bilaterally, greatest in the parieto-occipital lobes. While nonspecific, findings could relate to prior microhemorrhages from amyloid angiopathy or prior microemboli. The distribution is atypical for hypertensive hemorrhages. Electronically Signed   By: Margaretha Sheffield M.D.   On: 03/26/2021 11:04   CT CHEST ABDOMEN PELVIS W CONTRAST  Result Date: 02/27/2021 CLINICAL DATA:  Gastrointestinal cancer. Stage III colon cancer. Restaging. EXAM: CT CHEST, ABDOMEN, AND PELVIS WITH CONTRAST TECHNIQUE: Multidetector CT imaging of the chest, abdomen and pelvis was performed following the standard protocol during bolus administration of intravenous contrast. CONTRAST:  23mL OMNIPAQUE IOHEXOL 350 MG/ML SOLN COMPARISON:  Chest CT 06/10/2020. CT angio abdomen/pelvis 06/08/2020. FINDINGS: CT CHEST FINDINGS Cardiovascular: The heart size is normal. No substantial pericardial effusion. Coronary artery calcification is evident. Mild atherosclerotic calcification is noted in the wall of the thoracic aorta. Mediastinum/Nodes: Previously described right internal mammary lymph node is stable on 18/2. 7 mm short axis right pericardiac node on 42/2 was 8 mm on 06/08/2020. Anterior right juxta diaphragmatic lymph node measuring 4 mm short axis today on 46/2 was 5 mm previously. 11 mm node adjacent to the right atrium on 42/2 was 12 mm previously. No new mediastinal lymphadenopathy. There is no hilar lymphadenopathy. The esophagus has normal imaging features. There is no axillary lymphadenopathy. Lungs/Pleura: No suspicious pulmonary nodule or mass. No focal airspace consolidation. No pleural effusion. Musculoskeletal: No worrisome lytic or  sclerotic osseous abnormality. CT ABDOMEN PELVIS FINDINGS Hepatobiliary: Subtle nodularity of liver contour is stable. Tiny hypodensity in the posterior left liver is unchanged. No suspicious mass lesion within the liver parenchyma. Gallbladder surgically absent. No intrahepatic or extrahepatic biliary dilation. Pancreas: No focal mass lesion. No dilatation of the main duct. No intraparenchymal cyst. No peripancreatic edema. Spleen: No splenomegaly. No focal mass lesion. Adrenals/Urinary Tract: No adrenal nodule or mass. Central sinus cysts noted in both kidneys. No suspicious enhancing renal mass lesion. No evidence for hydroureter. Bladder is obscured by beam hardening artifact from bilateral hip replacement. Stomach/Bowel: Stomach unremarkable. Duodenum is normally positioned as is the ligament of Treitz. No small bowel wall thickening. No small bowel dilatation. The terminal ileum is normal. Apparent wall thickening in the cecal tip may reflect underdistention/unopacified bowel. Colon anastomosis noted mid transverse segment. Sigmoid colon obscured by beam hardening artifact. Vascular/Lymphatic: Insert calcium aorta similar appearance hepatoduodenal ligament lymphadenopathy. 16 mm short axis node on 60/2 was 18 mm previously. Celiac node measuring 14 mm today was 15 mm (remeasured) previously. No retroperitoneal lymphadenopathy. Insert no pelvic lymphadenopathy. Reproductive: Pelvic floor largely obscured by beam hardening artifact. Other: No intraperitoneal free fluid. Musculoskeletal: Wide-mouth midline ventral hernia is new since 06/08/2020. Hernia sac contains small bowel without complicating features. Bilateral hip replacement. Diffuse osteopenia without overtly suspicious lytic or sclerotic  osseous abnormality. Status post vertebral augmentation at L3. Mild compression deformity at L2 is stable. IMPRESSION: 1. No new or progressive findings to suggest recurrent or metastatic disease in the chest, abdomen,  or pelvis. 2. Similar appearance of mild lymphadenopathy in the chest and abdomen. 3. Subtle nodularity of liver contour suggests cirrhosis. 4. Apparent wall thickening in the cecal tip may reflect underdistention/unopacified bowel. Continued close attention on follow-up recommended. 5. New wide-mouth midline ventral hernia contains small bowel without complicating features. 6. Aortic Atherosclerosis (ICD10-I70.0). Electronically Signed   By: Misty Stanley M.D.   On: 02/27/2021 12:25   EEG adult  Result Date: 03/26/2021 Lora Havens, MD     03/26/2021  5:13 PM Patient Name: Crystal Lee MRN: [id number] Epilepsy Attending: Lora Havens Referring Physician/Provider: Dr Rosalin Hawking Date: 03/26/2021 Duration: 26.07 mins Patient history: 87 yo F. She states that yesterday, she was using her phone and noticed that all of the words were running together and that she was unable to read. EEG to evaluate for seizure Level of alertness: Awake, asleep AEDs during EEG study: None Technical aspects: This EEG study was done with scalp electrodes positioned according to the 10-20 International system of electrode placement. Electrical activity was acquired at a sampling rate of 500Hz  and reviewed with a high frequency filter of 70Hz  and a low frequency filter of 1Hz . EEG data were recorded continuously and digitally stored. Description: The posterior dominant rhythm consists of 8-9 Hz activity of moderate voltage (25-35 uV) seen predominantly in posterior head regions, symmetric and reactive to eye opening and eye closing. Sleep was characterized by vertex waves, sleep spindles (12 to 14 Hz), maximal frontocentral region. Physiologic photic driving was seen during photic stimulation.  Hyperventilation was not performed.   IMPRESSION: This study is within normal limits. No seizures or epileptiform discharges were seen throughout the recording. Lora Havens   ECHOCARDIOGRAM COMPLETE  Result Date: 03/26/2021     ECHOCARDIOGRAM REPORT   Patient Name:   Crystal Lee Date of Exam: 03/26/2021 Medical Rec #:  [id number]    Height:       66.0 in Accession #:    [id number]   Weight:       203.3 lb Date of Birth:  20-Jul-1933    BSA:          2.014 m Patient Age:    23 years     BP:           149/60 mmHg Patient Gender: F            HR:           65 bpm. Exam Location:  Inpatient Procedure: 2D Echo, Cardiac Doppler and Color Doppler Indications:    Stroke  History:        Patient has prior history of Echocardiogram examinations. Risk                 Factors:Hypertension.  Sonographer:    Jyl Heinz Referring Phys: [place]  1. Left ventricular ejection fraction, by estimation, is 55 to 60%. The left ventricle has normal function. The left ventricle has no regional wall motion abnormalities. Left ventricular diastolic parameters are indeterminate.  2. Right ventricular systolic function is normal. The right ventricular size is normal. There is mildly elevated pulmonary artery systolic pressure. The estimated right ventricular systolic pressure is 41.2 mmHg.  3. The mitral valve is normal in structure. Trivial mitral valve regurgitation. No evidence of mitral  stenosis.  4. The aortic valve is tricuspid. Aortic valve regurgitation is not visualized. Mild aortic valve sclerosis is present, with no evidence of aortic valve stenosis.  5. The inferior vena cava is dilated in size with <50% respiratory variability, suggesting right atrial pressure of 15 mmHg. FINDINGS  Left Ventricle: Left ventricular ejection fraction, by estimation, is 55 to 60%. The left ventricle has normal function. The left ventricle has no regional wall motion abnormalities. The left ventricular internal cavity size was normal in size. There is  no left ventricular hypertrophy. Left ventricular diastolic parameters are indeterminate. Normal left ventricular filling pressure. Right Ventricle: The right ventricular size is normal. No  increase in right ventricular wall thickness. Right ventricular systolic function is normal. There is mildly elevated pulmonary artery systolic pressure. The tricuspid regurgitant velocity is 2.38  m/s, and with an assumed right atrial pressure of 15 mmHg, the estimated right ventricular systolic pressure is 82.9 mmHg. Left Atrium: Left atrial size was normal in size. Right Atrium: Right atrial size was normal in size. Pericardium: There is no evidence of pericardial effusion. Mitral Valve: The mitral valve is normal in structure. Mild mitral annular calcification. Trivial mitral valve regurgitation. No evidence of mitral valve stenosis. Tricuspid Valve: The tricuspid valve is normal in structure. Tricuspid valve regurgitation is trivial. No evidence of tricuspid stenosis. Aortic Valve: The aortic valve is tricuspid. Aortic valve regurgitation is not visualized. Mild aortic valve sclerosis is present, with no evidence of aortic valve stenosis. Aortic valve peak gradient measures 6.5 mmHg. Pulmonic Valve: The pulmonic valve was normal in structure. Pulmonic valve regurgitation is trivial. No evidence of pulmonic stenosis. Aorta: The aortic root is normal in size and structure. Venous: The inferior vena cava is dilated in size with less than 50% respiratory variability, suggesting right atrial pressure of 15 mmHg. IAS/Shunts: The interatrial septum appears to be lipomatous. No atrial level shunt detected by color flow Doppler.  LEFT VENTRICLE PLAX 2D LVIDd:         5.20 cm     Diastology LVIDs:         4.00 cm     LV e' medial:    6.42 cm/s LV PW:         0.80 cm     LV E/e' medial:  13.2 LV IVS:        0.90 cm     LV e' lateral:   8.59 cm/s LVOT diam:     2.00 cm     LV E/e' lateral: 9.8 LV SV:         75 LV SV Index:   37 LVOT Area:     3.14 cm  LV Volumes (MOD) LV vol d, MOD A2C: 96.3 ml LV vol d, MOD A4C: 85.9 ml LV vol s, MOD A2C: 40.1 ml LV vol s, MOD A4C: 35.2 ml LV SV MOD A2C:     56.2 ml LV SV MOD A4C:      85.9 ml LV SV MOD BP:      52.9 ml RIGHT VENTRICLE             IVC RV Basal diam:  3.50 cm     IVC diam: 2.70 cm RV S prime:     12.20 cm/s TAPSE (M-mode): 2.1 cm LEFT ATRIUM             Index        RIGHT ATRIUM           Index  LA diam:        3.80 cm 1.89 cm/m   RA Area:     15.80 cm LA Vol (A2C):   46.0 ml 22.84 ml/m  RA Volume:   38.90 ml  19.32 ml/m LA Vol (A4C):   40.7 ml 20.21 ml/m LA Biplane Vol: 45.0 ml 22.35 ml/m  AORTIC VALVE AV Area (Vmax): 2.38 cm AV Vmax:        127.50 cm/s AV Peak Grad:   6.5 mmHg LVOT Vmax:      96.70 cm/s LVOT Vmean:     70.550 cm/s LVOT VTI:       0.238 m  AORTA Ao Root diam: 3.30 cm Ao Asc diam:  3.60 cm MITRAL VALVE               TRICUSPID VALVE MV Area (PHT): 3.31 cm    TR Peak grad:   22.7 mmHg MV Decel Time: 229 msec    TR Vmax:        238.00 cm/s MV E velocity: 84.60 cm/s MV A velocity: 77.80 cm/s  SHUNTS MV E/A ratio:  1.09        Systemic VTI:  0.24 m                            Systemic Diam: 2.00 cm Fransico Him MD Electronically signed by Fransico Him MD Signature Date/Time: 03/26/2021/9:05:31 AM    Final    CT HEAD CODE STROKE WO CONTRAST  Result Date: 03/25/2021 CLINICAL DATA:  Code stroke. Neuro deficit, acute, stroke suspected. Slurred speech and right-sided weakness beginning 1100 hours. EXAM: CT HEAD WITHOUT CONTRAST TECHNIQUE: Contiguous axial images were obtained from the base of the skull through the vertex without intravenous contrast. COMPARISON:  None. FINDINGS: Brain: No evidence of accelerated atrophy. Minimal small vessel change of the cerebral hemispheric white matter. No sign of acute infarction, mass lesion, hemorrhage, hydrocephalus or extra-axial collection. Vascular: There is atherosclerotic calcification of the major vessels at the base of the brain. Skull: Negative Sinuses/Orbits: Clear/normal Other: None ASPECTS ([place] Stroke Program Early CT Score) - Ganglionic level infarction (caudate, lentiform nuclei, internal capsule, insula,  M1-M3 cortex): 7 - Supraganglionic infarction (M4-M6 cortex): 3 Total score (0-10 with 10 being normal): 10 IMPRESSION: 1. No acute finding. Mild chronic small-vessel ischemic change of the white matter. 2. There is atherosclerotic calcification of the major vessels at the base of the brain. 3. ASPECTS is 10 4. These results were communicated to Dr. Quinn Axe at 2:32 pm on 03/25/2021 by text page via the [facility] messaging system. Electronically Signed   By: Nelson Chimes M.D.   On: 03/25/2021 14:33    Microbiology: Recent Results (from the past 240 hour(s))  Resp Panel by RT-PCR (Flu A&B, Covid) Nasopharyngeal Swab     Status: None   Collection Time: 03/25/21  2:34 PM   Specimen: Nasopharyngeal Swab; Nasopharyngeal(NP) swabs in vial transport medium  Result Value Ref Range Status   SARS Coronavirus 2 by RT PCR NEGATIVE NEGATIVE Final    Comment: (NOTE) SARS-CoV-2 target nucleic acids are NOT DETECTED.  The SARS-CoV-2 RNA is generally detectable in upper respiratory specimens during the acute phase of infection. The lowest concentration of SARS-CoV-2 viral copies this assay can detect is 138 copies/mL. A negative result does not preclude SARS-Cov-2 infection and should not be used as the sole basis for treatment or other patient management decisions. A negative result may occur with  improper specimen collection/handling, submission of specimen other than nasopharyngeal swab, presence of viral mutation(s) within the areas targeted by this assay, and inadequate number of viral copies(<138 copies/mL). A negative result must be combined with clinical observations, patient history, and epidemiological information. The expected result is Negative.  Fact Sheet for Patients:  EntrepreneurPulse.com.au  Fact Sheet for Healthcare Providers:  IncredibleEmployment.be  This test is no t yet approved or cleared by the [place] FDA and  has been authorized for  detection and/or diagnosis of SARS-CoV-2 by FDA under an Emergency Use Authorization (EUA). This EUA will remain  in effect (meaning this test can be used) for the duration of the COVID-19 declaration under Section 564(b)(1) of the Act, 21 U.S.C.section 360bbb-3(b)(1), unless the authorization is terminated  or revoked sooner.       Influenza A by PCR NEGATIVE NEGATIVE Final   Influenza B by PCR NEGATIVE NEGATIVE Final    Comment: (NOTE) The Xpert Xpress SARS-CoV-2/FLU/RSV plus assay is intended as an aid in the diagnosis of influenza from Nasopharyngeal swab specimens and should not be used as a sole basis for treatment. Nasal washings and aspirates are unacceptable for Xpert Xpress SARS-CoV-2/FLU/RSV testing.  Fact Sheet for Patients: EntrepreneurPulse.com.au  Fact Sheet for Healthcare Providers: IncredibleEmployment.be  This test is not yet approved or cleared by the [place] FDA and has been authorized for detection and/or diagnosis of SARS-CoV-2 by FDA under an Emergency Use Authorization (EUA). This EUA will remain in effect (meaning this test can be used) for the duration of the COVID-19 declaration under Section 564(b)(1) of the Act, 21 U.S.C. section 360bbb-3(b)(1), unless the authorization is terminated or revoked.  Performed at [facility], [address] [place], [place] [address]   Urine Culture     Status: Abnormal (Preliminary result)   Collection Time: 03/25/21  3:28 PM   Specimen: Urine, Clean Catch  Result Value Ref Range Status   Specimen Description   Final    URINE, CLEAN CATCH Performed at [facility], [address] [place], [place] [address]    Special Requests   Final    NONE Performed at [facility], [address] [place], [place] [address]    Culture (A)  Final    >=100,000 COLONIES/mL GRAM NEGATIVE RODS IDENTIFICATION AND SUSCEPTIBILITIES TO FOLLOW Performed at  [facility], [address] [place], [place] [address]    Report Status PENDING  Incomplete     Labs: Basic Metabolic Panel: Recent Labs  Lab 03/25/21 1418 03/26/21 0242 03/27/21 0200  NA 136  --  136  K 3.8  --  3.9  CL 103  --  107  CO2 25  --  24  GLUCOSE 103*  --  93  BUN 14  --  12  CREATININE 0.59 0.72 0.68  CALCIUM 9.4  --  9.4   Liver Function Tests: Recent Labs  Lab 03/25/21 1418  AST 23  ALT 18  ALKPHOS 87  BILITOT 0.4  PROT 7.9  ALBUMIN 4.1   No results for input(s): LIPASE, AMYLASE in the last 168 hours. No results for input(s): AMMONIA in the last 168 hours. CBC: Recent Labs  Lab 03/25/21 1418 03/26/21 0242  WBC 6.8 6.3  NEUTROABS 3.8  --   HGB 13.4 12.6  HCT 41.0 38.0  MCV 91.1 91.1  PLT 141* 125*   Cardiac Enzymes: No results for input(s): CKTOTAL, CKMB, CKMBINDEX, TROPONINI in the  last 168 hours. BNP: BNP (last 3 results) No results for input(s): BNP in the last 8760 hours.  ProBNP (last 3 results) No results for input(s): PROBNP in the last 8760 hours.  CBG: Recent Labs  Lab 03/25/21 1412  GLUCAP 95       Signed:  Florencia Reasons MD, PhD, FACP  [facility] 03/27/2021, 1:05 PM

## 2021-03-27 NOTE — Progress Notes (Signed)
Texted daughter regarding change in antibiotic. PT had already been discharged  without knowledge of change.

## 2021-03-29 NOTE — Progress Notes (Addendum)
[place] [facility] [employer] [place], [place], [place] [address] [phone]  Date:  04/01/2021   Name:  Crystal Lee   DOB:  1933-10-28   MRN:  [id number]  PCP:  Darreld Mclean, MD    Chief Complaint: Hospitalization Follow-up (Wants to talk about medication /90 day supply of all meds - all at rhe same time )   History of Present Illness:  Crystal Lee is a 87 y.o. very pleasant female patient who presents with the following:  Pt following up from recent admission for strokelike symptoms-   Admit date: 03/25/2021 Discharge date: 03/27/2021  Recommendations for Outpatient Follow-up:  F/u with PCP Dr Lorelei Pont on 11/2, pcp will follow up on final urine culture result, adjust abx if needed, pcp to refer patient to urogyeocolgist  F/u with neurology Dr Krista Blue   Addendum:  Urine culture came back later in the afternoon, it was ecoli that is  resistant to bactrim, but sensitive to cefazolin , new abx called to her pharmacy :keflex 500mg  bid x5 days Daughter updated over the phone regarding abx changes    Discharge Diagnoses:      Active Hospital Problems    Diagnosis Date Noted   Stroke-like symptoms 03/25/2021   Hypertension     Malignant neoplasm of descending colon [facility])       Resolved Hospital Problems  No resolved problems to display.    History of present illness: ( per admitting MDDr Hal Hope) Chief Complaint: Visual disturbances. HPI: Crystal Lee is a 87 y.o. female with history of hypertension, hyperlipidemia, sleep apnea, breast cancer and colon cancer presently in remission started experiencing difficulty reading right at around 11 AM.  The symptoms lasted for almost 20 minutes.  Patient's daughter came and checked on her and was brought to the ER.  There is also some concern for possible right facial droop and right sided weakness. ED Course: In the ER neurologist evaluated and felt that patient had mild right  facial droop with left upper quadrantanopia.  Recommended admission for further possible stroke work-up.  UA is concerning for UTI EKG shows sinus tachycardia.  COVID test was negative.  Patient was afebrile.  On my exam patient is able to move all extremities. Hospital Course:  Principal Problem:   Stroke-like symptoms Active Problems:   Malignant neoplasm of descending colon (HCC)   Hypertension   Visual disturbances MRI did not show acute CVA, did show amyloid angiopathy EEG no seizure seen by neurologist "Recommend aspirin 81 on discharge, no DAPT given concerning for amyloid angiopathy.  Added Lipitor 20, continue on discharge.  Recommend long-term BP less than 140 given concerning for amyloid angiopathy" F/u with neurology in 4weeks   HTN:  Continue home meds, long term bp goal, sbp less than 140 F/u with pcp   HLD, continue fenofibrate, started lipitor 20mg  daily per neurology recommendation Monitor lft, f/u with pcp   Frequent UTI She is currently having UTI with dysuria, urinary urgency and frequency, urine culture in process, she desires to go home, case discussed with pcp who is going to follow up on final urine culture result over the weekend, will discharge home on bactrim for now She also request pyridium , prescribed Patient and daughter both desires discharge today, they are aware of following up with pcp over the weekend for culture result,  I also advised patient seek medical care if she develop fever or does not feel well over  the weekend.    Pm Addendum:  Urine culture came back later in the afternoon, it was ecoli that is  resistant to bactrim, but sensitive to cefazolin , new abx called to her pharmacy :keflex 500mg  bid x5 days  She took her last antibiotic today for UTI She brought a urine sample which she collected at home earlier today for repeat culture-patient has a very hard time giving a sample in the office  Crystal Lee is concerned because she was told to  start back on statin-in the past she was noted to have transaminitis and her statin was stopped in case it was the culprit.  Per patient recollection Dr. Marin Olp made this recommendation.  I advised her I think okay to continue her statin for the short-term, I will touch base with Dr. Marin Olp and make sure he does not have any major objection  She has an appointment with urogynecology in early December for frequent UTI Appointment for neurology is also set up in December Per my understanding, her evaluation did not show an acute CVA but does show concern for amyloid in the brain  Flu vaccine today  Patient Active Problem List   Diagnosis Date Noted   Stroke-like symptoms 03/25/2021   Sleep apnea    Rosacea    PONV (postoperative nausea and vomiting)    Nocturia    Neuropathy    Melanoma ([place])    Hypertension    Hyperlipidemia    Fractured hip ([place])    Difficulty sleeping    Complication of anesthesia    Cancer ([place])    Arthritis    Malignant neoplasm of descending colon ([place])    Gastric polyps    Colonic mass    Diverticulosis of colon without hemorrhage    Adenomatous polyp of sigmoid colon    Acute lower GI bleeding 06/09/2020   Lower GI bleed 06/08/2020   Acute blood loss anemia 06/08/2020   HTN (hypertension) 06/08/2020   Constipation    Encounter for orthopedic follow-up care 06/18/2019   Urinary tract infection 04/20/2019   Acute pain of right wrist 03/28/2019   Tenosynovitis, wrist 03/28/2019   Acquired trigger finger of right little finger 01/15/2019   Carpal tunnel syndrome of right wrist 01/15/2019   Aftercare 08/17/2018   Lumbar post-laminectomy syndrome 07/31/2018   Heartburn    Fracture of superior pubic ramus ([place]) 06/16/2018   Radial styloid tenosynovitis of left hand 05/16/2018   Pain of left hand 04/20/2018   Osteopenia 04/15/2018   Dyslipidemia 02/02/2018   GERD (gastroesophageal reflux disease) 02/02/2018   Trochanteric bursitis of left hip  12/23/2017   Primary osteoarthritis of left knee 10/17/2017   History of right knee joint replacement 10/17/2017   Pain in both lower extremities 08/02/2017   Stage 1 breast cancer, ER+, right ([place]) 07/04/2017   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   Pain in right hand 06/10/2017   Trigger finger of right hand 06/10/2017   Paresthesia 02/09/2017   Low back pain 02/09/2017   Gait abnormality 02/09/2017   OA (osteoarthritis) of hip 05/09/2015    Past Medical History:  Diagnosis Date   Arthritis    Cancer ([place])    HX BREAST CANCER/ SKIN CANCER   Complication of anesthesia    N/V WITH MORPHINE   Difficulty sleeping    Fractured hip ([place])    LEFT - AUG 2016   GERD (gastroesophageal reflux disease)    Hyperlipidemia    Hypertension    Melanoma ([place])  Neuropathy    Nocturia    Osteopenia    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   PONV (postoperative nausea and vomiting)    PT STATES MORPHINE CAUSED N/V   Rosacea    Sleep apnea    Stage 1 breast cancer, ER+, right ([place]) 07/04/2017    Past Surgical History:  Procedure Laterality Date   104 HOUR [place] STUDY N/A 06/21/2018   Procedure: 24 HOUR PH STUDY;  Surgeon: Lavena Bullion, DO;  Location: [facility];  Service: Gastroenterology;  Laterality: N/A;  with impedance   ABDOMINAL HYSTERECTOMY  2010   ANAL RECTAL MANOMETRY N/A 09/19/2019   Procedure: ANO RECTAL MANOMETRY;  Surgeon: Mauri Pole, MD;  Location: [facility];  Service: Endoscopy;  Laterality: N/A;   [place]   BIOPSY  06/10/2020   Procedure: BIOPSY;  Surgeon: Lavena Bullion, DO;  Location: [facility];  Service: Gastroenterology;;  EGD and COLON   BREAST SURGERY     CATARACT EXTRACTION Bilateral 2011   CHOLECYSTECTOMY     COLON RESECTION N/A 06/12/2020   Procedure: LAPAROSCOPIC COLON RESECTION;  Surgeon: [name], MD;  Location: [facility];  Service: General;  Laterality: N/A;   COLONOSCOPY  WITH PROPOFOL N/A 06/10/2020   Procedure: COLONOSCOPY WITH PROPOFOL;  Surgeon: Lavena Bullion, DO;  [facility]: [facility];  Service: Gastroenterology;  Laterality: N/A;   ESOPHAGEAL MANOMETRY N/A 06/21/2018   Procedure: ESOPHAGEAL MANOMETRY (EM);  Surgeon: Lavena Bullion, DO;  [facility];  Service: Gastroenterology;  Laterality: N/A;   ESOPHAGOGASTRODUODENOSCOPY (EGD) WITH PROPOFOL N/A 06/10/2020   Procedure: ESOPHAGOGASTRODUODENOSCOPY (EGD) WITH PROPOFOL;  Surgeon: Lavena Bullion, DO;  [facility];  Service: Gastroenterology;  Laterality: N/A;   GALLBLADDER SURGERY  2015   JOINT REPLACEMENT     RT TOTAL HIP / RT TOTAL KNEE   MASTECTOMY  1998   BILATERAL    [place] IMPEDANCE STUDY  06/21/2018   Procedure: [place] IMPEDANCE STUDY;  Surgeon: Lavena Bullion, DO;  [facility]: [facility];  Service: Gastroenterology;;   POLYPECTOMY  06/10/2020   Procedure: POLYPECTOMY;  Surgeon: Lavena Bullion, DO;  [facility];  Service: Gastroenterology;;   SKIN CANCER EXCISION  2016   RT SIDE OF NOSE   SUBMUCOSAL TATTOO INJECTION  06/10/2020   Procedure: SUBMUCOSAL TATTOO INJECTION;  Surgeon: Lavena Bullion, DO;  [facility];  Service: Gastroenterology;;   [place]  2010   RIGHT   TOTAL HIP ARTHROPLASTY Left 05/09/2015   Procedure: LEFT TOTAL HIP ARTHROPLASTY ANTERIOR APPROACH;  Surgeon: Gaynelle Arabian, MD;  Location: [facility];  Service: Orthopedics;  Laterality: Left;   TOTAL KNEE ARTHROPLASTY  2001   TUMOR REMOVAL  2012   ABDOMINAL - NON CANCEROUS    Social History   Tobacco Use   Smoking status: Former    Packs/day: 0.50    Years: 20.00    Pack years: 10.00    Types: Cigarettes    Quit date: 02/03/1976    Years since quitting: 45.1   Smokeless tobacco: Never  Vaping Use   Vaping Use: Never used  Substance Use Topics   Alcohol use: No   Drug use: No    Family History  Problem Relation Age of Onset    Other Mother        complications from flu   Other Father        unsure of cause  Colon cancer Neg Hx     Allergies  Allergen Reactions   Darvon [Propoxyphene] Nausea And Vomiting and Palpitations    Darvocet Causes Sweats   Adhesive [Tape] Rash   Morphine And Related Nausea And Vomiting    Medication list has been reviewed and updated.  Current Outpatient Medications on File Prior to Visit  Medication Sig Dispense Refill   aspirin EC 81 MG tablet Take 81 mg by mouth once. Swallow whole.     atorvastatin (LIPITOR) 20 MG tablet Take 1 tablet (20 mg total) by mouth daily. 30 tablet 0   cephALEXin (KEFLEX) 500 MG capsule Take 1 capsule (500 mg total) by mouth 2 (two) times daily for 5 days. 10 capsule 0   cyclobenzaprine (FLEXERIL) 5 MG tablet Take 1 tablet (5 mg total) by mouth at bedtime as needed for muscle spasms. 90 tablet 0   diphenoxylate-atropine (LOMOTIL) 2.5-0.025 MG tablet Take 1 tablet by mouth 4 (four) times daily as needed for diarrhea or loose stools. 30 tablet 0   estradiol (ESTRACE VAGINAL) 0.1 MG/GM vaginal cream Apply 1gm vaginally 1-3x a week as needed for comfort (Patient taking differently: Place 1 g vaginally See admin instructions. 1g vaginally once to three times a week as needed for comfort with UTI) 42.5 g 12   HYDROcodone-acetaminophen (NORCO) 10-325 MG tablet Take 1 tablet by mouth every 6 (six) hours as needed. Do Not Fill Before 05/02/2021 120 tablet 0   Multiple Vitamins-Minerals (MULTIVITAMIN ADULTS) TABS Take 1 tablet by mouth daily.     naloxegol oxalate (MOVANTIK) 12.5 MG TABS tablet Take 1 tablet (12.5 mg total) by mouth daily. 90 tablet 3   phenazopyridine (PYRIDIUM) 200 MG tablet Take 1 tablet (200 mg total) by mouth 3 (three) times daily as needed for pain. 40 tablet 0   promethazine (PHENERGAN) 12.5 MG tablet Take 1 tablet (12.5 mg total) by mouth every 6 (six) hours as needed for nausea or vomiting. 90 tablet 3   No current facility-administered  medications on file prior to visit.    Review of Systems:  As per HPI- otherwise negative.   Physical Examination: Vitals:   04/01/21 1458 04/01/21 1534  BP: (!) 150/60 140/70  Pulse: 69   Temp: 97.7 F (36.5 C)   SpO2: 97%    Vitals:   04/01/21 1458  Weight: 206 lb 12.8 oz (93.8 kg)  Height: 5\' 6"  (1.676 m)   Body mass index is 33.38 kg/m. Ideal Body Weight: Weight in (lb) to have BMI = 25: 154.6  GEN: no acute distress.  Obese, looks well and her normal self HEENT: Atraumatic, Normocephalic.  Ears and Nose: No external deformity. CV: RRR, No M/G/R. No JVD. No thrill. No extra heart sounds. PULM: CTA B, no wheezes, crackles, rhonchi. No retractions. No resp. distress. No accessory muscle use. ABD: S, NT, ND EXTR: No c/c/e PSYCH: Normally interactive. Conversant.   Patient has brought in a urine sample from home in a sterile cup.  It is red in color, she notes she has been taking over-the-counter Pyridium Assessment and Plan: Hospital discharge follow-up  Dyslipidemia  Visual disturbance  Essential hypertension - Plan: losartan (COZAAR) 25 MG tablet  Acute cystitis without hematuria - Plan: Urine Culture, Urine Microscopic Only  Need for immunization against influenza - Plan: Flu Vaccine QUAD High Dose(Fluad)  Patient seen today for hospital follow-up.  She was admitted recently following a discrete period of visual disturbance, concern for CVA.  CVA work-up was negative, but there  is evidence for possible amyloid deposition on MRI.  She has an appointment to see neurology next month For now she is stable.  Blood pressures under control, I have refilled her losartan Her antilipid medication was changed to a statin.  We will plan to monitor her LFTs and I will check with Dr. Marin Olp about this change-he originally advised her to stop her statin due to transaminitis  She has noted frequent UTI, and appointment is pending with urogynecology She brings in a urine  sample today for culture  Signed Lamar Blinks, MD  Received urine micro Results for orders placed or performed in visit on 04/01/21  Urine Microscopic Only  Result Value Ref Range   WBC, UA 3-6/hpf (A) 0-2/hpf   RBC / HPF none seen 0-2/hpf   Mucus, UA Presence of (A) None   Squamous Epithelial / LPF Rare(0-4/hpf) Rare(0-4/hpf)   Ca Oxalate Crys, UA Presence of (A) None   Call patient upon receipt of urine culture, 11/4  Results for orders placed or performed in visit on 04/01/21  Urine Culture   Specimen: Urine  Result Value Ref Range   MICRO NUMBER: [id number]    SPECIMEN QUALITY: Adequate    Sample Source NOT GIVEN    STATUS: FINAL    Result: No Growth   Urine Microscopic Only  Result Value Ref Range   WBC, UA 3-6/hpf (A) 0-2/hpf   RBC / HPF none seen 0-2/hpf   Mucus, UA Presence of (A) None   Squamous Epithelial / LPF Rare(0-4/hpf) Rare(0-4/hpf)   Ca Oxalate Crys, UA Presence of (A) None   Her culture is negative.  However she continues to note urinary frequency and discomfort.  Her last several UTIs have grown multidrug-resistant bacteria I want to have her bring a urine for culture today, but she states this is not possible.  She will plan to bring [place] a urine culture on Monday.  I will asked staff to schedule her with the lab

## 2021-03-30 ENCOUNTER — Encounter: Payer: Self-pay | Admitting: Hematology & Oncology

## 2021-03-31 ENCOUNTER — Telehealth: Payer: Self-pay

## 2021-03-31 NOTE — Telephone Encounter (Signed)
Transition Care Management Follow-up Telephone Call Date of discharge and from where: 03/27/2021-[facility] How have you been since you were released from the hospital? Doing pretty good. Any questions or concerns? No  Items Reviewed: Did the pt receive and understand the discharge instructions provided? Yes  Medications obtained and verified? Yes  Other? Yes  Any new allergies since your discharge? No  Dietary orders reviewed? Yes Do you have support at home? Yes   Home Care and Equipment/Supplies: Were home health services ordered? no If so, what is the name of the agency? N/a  Has the agency set up a time to come to the patient's home? not applicable Were any new equipment or medical supplies ordered?  No What is the name of the medical supply agency? N/a Were you able to get the supplies/equipment? not applicable Do you have any questions related to the use of the equipment or supplies? N/a  Functional Questionnaire: (I = Independent and D = Dependent) ADLs: I  Bathing/Dressing- I  Meal Prep- I  Eating- I  Maintaining continence- I  Transferring/Ambulation- I WITH WALKER  Managing Meds- I  Follow up appointments reviewed:  PCP Hospital f/u appt confirmed? Yes  Scheduled to see Dr. Lorelei Pont on 04/01/2021 @ 3:20. [facility] f/u appt confirmed? Yes  Scheduled to see Dr. Krista Blue on 05/05/2021 @ 11:30. Are transportation arrangements needed? No  If their condition worsens, is the pt aware to call PCP or go to the Emergency Dept.? Yes Was the patient provided with contact information for the PCP's office or ED? Yes Was to pt encouraged to call back with questions or concerns? Yes

## 2021-04-01 ENCOUNTER — Encounter: Payer: Self-pay | Admitting: Family Medicine

## 2021-04-01 ENCOUNTER — Ambulatory Visit (INDEPENDENT_AMBULATORY_CARE_PROVIDER_SITE_OTHER): Payer: Medicare Other | Admitting: Family Medicine

## 2021-04-01 ENCOUNTER — Other Ambulatory Visit: Payer: Self-pay

## 2021-04-01 ENCOUNTER — Encounter: Payer: Medicare Other | Attending: Physical Medicine & Rehabilitation | Admitting: Registered Nurse

## 2021-04-01 ENCOUNTER — Encounter: Payer: Self-pay | Admitting: Registered Nurse

## 2021-04-01 VITALS — BP 152/74 | HR 69 | Ht 66.0 in | Wt 206.6 lb

## 2021-04-01 VITALS — BP 140/70 | HR 69 | Temp 97.7°F | Ht 66.0 in | Wt 206.8 lb

## 2021-04-01 DIAGNOSIS — E785 Hyperlipidemia, unspecified: Secondary | ICD-10-CM | POA: Diagnosis not present

## 2021-04-01 DIAGNOSIS — Z5181 Encounter for therapeutic drug level monitoring: Secondary | ICD-10-CM

## 2021-04-01 DIAGNOSIS — M170 Bilateral primary osteoarthritis of knee: Secondary | ICD-10-CM

## 2021-04-01 DIAGNOSIS — M7061 Trochanteric bursitis, right hip: Secondary | ICD-10-CM | POA: Diagnosis not present

## 2021-04-01 DIAGNOSIS — R202 Paresthesia of skin: Secondary | ICD-10-CM

## 2021-04-01 DIAGNOSIS — N390 Urinary tract infection, site not specified: Secondary | ICD-10-CM

## 2021-04-01 DIAGNOSIS — M7062 Trochanteric bursitis, left hip: Secondary | ICD-10-CM

## 2021-04-01 DIAGNOSIS — I10 Essential (primary) hypertension: Secondary | ICD-10-CM | POA: Diagnosis not present

## 2021-04-01 DIAGNOSIS — Z23 Encounter for immunization: Secondary | ICD-10-CM

## 2021-04-01 DIAGNOSIS — S32000S Wedge compression fracture of unspecified lumbar vertebra, sequela: Secondary | ICD-10-CM

## 2021-04-01 DIAGNOSIS — G894 Chronic pain syndrome: Secondary | ICD-10-CM

## 2021-04-01 DIAGNOSIS — M2550 Pain in unspecified joint: Secondary | ICD-10-CM | POA: Diagnosis not present

## 2021-04-01 DIAGNOSIS — Z79899 Other long term (current) drug therapy: Secondary | ICD-10-CM | POA: Diagnosis not present

## 2021-04-01 DIAGNOSIS — H539 Unspecified visual disturbance: Secondary | ICD-10-CM

## 2021-04-01 DIAGNOSIS — N3000 Acute cystitis without hematuria: Secondary | ICD-10-CM

## 2021-04-01 DIAGNOSIS — Z09 Encounter for follow-up examination after completed treatment for conditions other than malignant neoplasm: Secondary | ICD-10-CM

## 2021-04-01 DIAGNOSIS — M961 Postlaminectomy syndrome, not elsewhere classified: Secondary | ICD-10-CM

## 2021-04-01 LAB — URINALYSIS, MICROSCOPIC ONLY: RBC / HPF: NONE SEEN (ref 0–?)

## 2021-04-01 MED ORDER — HYDROCODONE-ACETAMINOPHEN 10-325 MG PO TABS: 1.0000 | ORAL_TABLET | Freq: Four times a day (QID) | ORAL | 0 refills | Status: DC | PRN

## 2021-04-01 MED ORDER — LOSARTAN POTASSIUM 25 MG PO TABS: 25.0000 mg | ORAL_TABLET | Freq: Every day | ORAL | 3 refills | Status: DC

## 2021-04-01 NOTE — Progress Notes (Signed)
Subjective:    Patient ID: Crystal Lee, female    DOB: February 12, 1934, 87 y.o.   MRN: [id number]  HPI: Crystal Lee is a 87 y.o. female who returns for follow up appointment for chronic pain and medication refill. She states her pain is located in her lower back, bilateral lower extremities and bilateral hips. She also reports bilateral knee pain and generalized joint pain. She rates her pain 5. Her current exercise regime is walking with walker.   Crystal Lee was admitted to [facility] on 03/25/2021 for right facial droop and right side weakness, she was discharged on 03/27/2021. Discharge summary was reviewed. Crystal Lee has a scheduled appointment with her PCP today and Neurology on 05/05/2021 with Dr Krista Blue.   Crystal Lee Morphine equivalent is 40.00  MME.    Last Oral Swab was Performed on 12/09/2020, it was consistent.   Pain Inventory Average Pain 5 Pain Right Now 5 My pain is sharp, burning, tingling, and aching  In the last 24 hours, has pain interfered with the following? General activity 6 Relation with others 6 Enjoyment of life 6 What TIME of day is your pain at its worst? evening Sleep (in general) NA  Pain is worse with: walking, bending, standing, and some activites Pain improves with: rest, heat/ice, and medication Relief from Meds: 3  Family History  Problem Relation Age of Onset   Other Mother        complications from flu   Other Father        unsure of cause   Colon cancer Neg Hx    Social History   Socioeconomic History   Marital status: Widowed    Spouse name: Not on file   Number of children: 3   Years of education: 16 years   Highest education level: Not on file  Occupational History   Occupation: Retired  Tobacco Use   Smoking status: Former    Packs/day: 0.50    Years: 20.00    Pack years: 10.00    Types: Cigarettes    Quit date: 02/03/1976    Years since quitting: 45.1   Smokeless tobacco: Never  Vaping Use   Vaping Use: Never used   Substance and Sexual Activity   Alcohol use: No   Drug use: No   Sexual activity: Yes    Birth control/protection: Post-menopausal  Other Topics Concern   Not on file  Social History Narrative   Lives at home with husband.   Right-handed.   No caffeine use.   Social Determinants of Health   Financial Resource Strain: Not on file  Food Insecurity: Not on file  Transportation Needs: Not on file  Physical Activity: Not on file  Stress: Not on file  Social Connections: Not on file   Past Surgical History:  Procedure Laterality Date   48 HOUR [place] STUDY N/A 06/21/2018   Procedure: 24 HOUR [place];  Surgeon: Lavena Bullion, DO;  Location: [facility];  Service: Gastroenterology;  Laterality: N/A;  with impedance   ABDOMINAL HYSTERECTOMY  2010   ANAL RECTAL MANOMETRY N/A 09/19/2019   Procedure: ANO RECTAL MANOMETRY;  Surgeon: Mauri Pole, MD;  Location: [facility];  Service: Endoscopy;  Laterality: N/A;   [place]   BIOPSY  06/10/2020   Procedure: BIOPSY;  Surgeon: Lavena Bullion, DO;  Location: [facility];  Service: Gastroenterology;;  EGD and COLON   BREAST SURGERY     CATARACT EXTRACTION  Bilateral 2011   CHOLECYSTECTOMY     COLON RESECTION N/A 06/12/2020   Procedure: LAPAROSCOPIC COLON RESECTION;  Surgeon: [name], MD;  Location: [facility];  Service: General;  Laterality: N/A;   COLONOSCOPY WITH PROPOFOL N/A 06/10/2020   Procedure: COLONOSCOPY WITH PROPOFOL;  Surgeon: Lavena Bullion, DO;  [facility];  Service: Gastroenterology;  Laterality: N/A;   ESOPHAGEAL MANOMETRY N/A 06/21/2018   Procedure: ESOPHAGEAL MANOMETRY (EM);  Surgeon: Lavena Bullion, DO;  [facility];  Service: Gastroenterology;  Laterality: N/A;   ESOPHAGOGASTRODUODENOSCOPY (EGD) WITH PROPOFOL N/A 06/10/2020   Procedure: ESOPHAGOGASTRODUODENOSCOPY (EGD) WITH PROPOFOL;  Surgeon: Lavena Bullion, DO;  [facility];   Service: Gastroenterology;  Laterality: N/A;   GALLBLADDER SURGERY  2015   JOINT REPLACEMENT     RT TOTAL HIP / RT TOTAL KNEE   MASTECTOMY  1998   BILATERAL    [place] IMPEDANCE STUDY  06/21/2018   Procedure: [place] IMPEDANCE STUDY;  Surgeon: Lavena Bullion, DO;  [facility];  Service: Gastroenterology;;   POLYPECTOMY  06/10/2020   Procedure: POLYPECTOMY;  Surgeon: Lavena Bullion, DO;  [facility];  Service: Gastroenterology;;   SKIN CANCER EXCISION  2016   RT SIDE OF NOSE   SUBMUCOSAL TATTOO INJECTION  06/10/2020   Procedure: SUBMUCOSAL TATTOO INJECTION;  Surgeon: Lavena Bullion, DO;  [facility];  Service: Gastroenterology;;   [place]  2010   RIGHT   TOTAL HIP ARTHROPLASTY Left 05/09/2015   Procedure: LEFT TOTAL HIP ARTHROPLASTY ANTERIOR APPROACH;  Surgeon: Gaynelle Arabian, MD;  [facility];  Service: Orthopedics;  Laterality: Left;   TOTAL KNEE ARTHROPLASTY  2001   TUMOR REMOVAL  2012   ABDOMINAL - NON CANCEROUS   Past Surgical History:  Procedure Laterality Date   93 HOUR [place] STUDY N/A 06/21/2018   Procedure: 24 HOUR [place] STUDY;  Surgeon: Lavena Bullion, DO;  [facility];  Service: Gastroenterology;  Laterality: N/A;  with impedance   ABDOMINAL HYSTERECTOMY  2010   ANAL RECTAL MANOMETRY N/A 09/19/2019   Procedure: ANO RECTAL MANOMETRY;  Surgeon: Mauri Pole, MD;  [facility];  Service: Endoscopy;  Laterality: N/A;   [place]   BIOPSY  06/10/2020   Procedure: BIOPSY;  Surgeon: Lavena Bullion, DO;  [facility];  Service: Gastroenterology;;  EGD and COLON   BREAST SURGERY     CATARACT EXTRACTION Bilateral 2011   CHOLECYSTECTOMY     COLON RESECTION N/A 06/12/2020   Procedure: LAPAROSCOPIC COLON RESECTION;  Surgeon: [name], MD;  [facility];  Service: General;  Laterality: N/A;   COLONOSCOPY WITH PROPOFOL N/A 06/10/2020    Procedure: COLONOSCOPY WITH PROPOFOL;  Surgeon: Lavena Bullion, DO;  [facility];  Service: Gastroenterology;  Laterality: N/A;   ESOPHAGEAL MANOMETRY N/A 06/21/2018   Procedure: ESOPHAGEAL MANOMETRY (EM);  Surgeon: Lavena Bullion, DO;  [facility];  Service: Gastroenterology;  Laterality: N/A;   ESOPHAGOGASTRODUODENOSCOPY (EGD) WITH PROPOFOL N/A 06/10/2020   Procedure: ESOPHAGOGASTRODUODENOSCOPY (EGD) WITH PROPOFOL;  Surgeon: Lavena Bullion, DO;  [facility];  Service: Gastroenterology;  Laterality: N/A;   GALLBLADDER SURGERY  2015   JOINT REPLACEMENT     RT TOTAL HIP / RT TOTAL KNEE   MASTECTOMY  1998   BILATERAL    [place] IMPEDANCE STUDY  06/21/2018   Procedure: [place] IMPEDANCE STUDY;  Surgeon: Gerrit Heck  V, DO;  [facility];  Service: Gastroenterology;;   POLYPECTOMY  06/10/2020   Procedure: POLYPECTOMY;  Surgeon: Lavena Bullion, DO;  [facility];  Service: Gastroenterology;;   SKIN CANCER EXCISION  2016   RT SIDE OF NOSE   SUBMUCOSAL TATTOO INJECTION  06/10/2020   Procedure: SUBMUCOSAL TATTOO INJECTION;  Surgeon: Lavena Bullion, DO;  [facility];  Service: Gastroenterology;;   [place]  2010   RIGHT   TOTAL HIP ARTHROPLASTY Left 05/09/2015   Procedure: LEFT TOTAL HIP ARTHROPLASTY ANTERIOR APPROACH;  Surgeon: Gaynelle Arabian, MD;  [facility];  Service: Orthopedics;  Laterality: Left;   TOTAL KNEE ARTHROPLASTY  2001   TUMOR REMOVAL  2012   ABDOMINAL - NON CANCEROUS   Past Medical History:  Diagnosis Date   Arthritis    Cancer ([place])    HX BREAST CANCER/ SKIN CANCER   Complication of anesthesia    N/V WITH MORPHINE   Difficulty sleeping    Fractured hip ([place])    LEFT - AUG 2016   GERD (gastroesophageal reflux disease)    Hyperlipidemia    Hypertension    Melanoma ([place])    Neuropathy    Nocturia    Osteopenia    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 2/9 04/01/2021 02/04/2021 01/06/2021 01/06/2021 12/09/2020 09/22/2020 08/11/2020  Decreased Interest 0 0 0 0 0 0 0  Down, Depressed, Hopeless 0 0 - 0 0 0 0  PHQ - 2 Score 0 0 0 0 0 0 0  Some recent data might be hidden     Review of Systems  Constitutional: Negative.   HENT: Negative.    Eyes: Negative.   Respiratory: Negative.    Cardiovascular: Negative.   Gastrointestinal: Negative.   Endocrine: Negative.   Genitourinary: Negative.   Musculoskeletal:  Positive for back pain and gait problem.  Skin: Negative.   Allergic/Immunologic: Negative.   Neurological:        Tingling  Hematological: Negative.   Psychiatric/Behavioral: Negative.    All other systems reviewed and are negative.     Objective:   Physical Exam Vitals and nursing note reviewed.  Constitutional:      Appearance: Normal appearance.  Cardiovascular:     Rate and Rhythm: Normal rate and regular rhythm.     Pulses: Normal pulses.     Heart sounds: Normal heart sounds.  Pulmonary:     Effort: Pulmonary effort is normal.     Breath sounds: Normal breath sounds.  Musculoskeletal:     Cervical back: Normal range of motion and neck supple.     Comments: Normal Muscle Bulk and Muscle Testing Reveals:  Upper Extremities: Full ROM and Muscle Strength 5/5 Lumbar Paraspinal Tenderness: L-3-L-5 Lower Extremities: Full ROM and Muscle Strength 5/5 Arises from chair slowly using walker  Narrow Based Gait     Skin:    General: Skin is warm and dry.  Neurological:  Mental Status: She is alert and oriented to person, place, and time.  Psychiatric:         Mood and Affect: Mood normal.        Behavior: Behavior normal.         Assessment & Plan:  1. Chronic Bilateral Leg pain: Paresthesia: Continue to Monitor. 04/01/2021 2. Paresthesia Doreene Burke Radiculitis: Crystal. Safley has weaned herself off the Lyrica due to daytime drowsiness. We will continue to monitor. 04/01/2021 3. Pain of Left Wrist/ : No complaints today. S/P Carpal Tunnel Release on 06/03/2017 by Dr. Ellene Route. Dr. Ellene Route Following. 04/01/2021. 4. Fracture of superior pubic ramus.  Dr. Wynelle Link Following.  Continue to monitor. 04/01/2021. 5. Bilateral Knee OA: Continue Voltaren Gel.  Continue to monitor. Orthopedist following. 04/01/2021 6. Polyarthralgia: Continue to alternate with heat and ice therapy. Continue current medication regime. Continue to monitor. 04/01/2021. 7. Chronic Pain Syndrome: Refilled::Hydrocodone 10/325 mg one tablet every 6 hours as needed for moderate pain #120. 04/01/2021. 8. Lumbar Compression Fracture L2 and L3:  Crystal. Elsey refused physical therapy. Continue with rest/ heat therapy. Continue to Monitor 04/01/2021. 9. Muscle Spasm: Continue  Flexeril 5 mg at HS. Continue to monitor. 04/01/2021. 10. Right Shoulder Tendonitis: No complaints today. Continue HEP as Tolerated. Alternate Ice and Heat Therapy. 04/01/2021  11. Greater Trochanter Bursitis: Continue to Monitor. Continue HEP as Tolerated.   F/U in 1 Month

## 2021-04-01 NOTE — Patient Instructions (Addendum)
It was good to see you today- take care and I will be in touch with your urine culture asap   Flu shot given today Please let me know what other rx you need me to send to [facility] I will touch base with Dr Marin Olp about your lipitor and your liver- I will let you know what he says about it

## 2021-04-02 LAB — URINE CULTURE
MICRO NUMBER:: 12583776
Result:: NO GROWTH
SPECIMEN QUALITY:: ADEQUATE

## 2021-04-03 NOTE — Addendum Note (Signed)
Addended by: Lamar Blinks C on: 04/03/2021 02:37 PM   Modules accepted: Orders

## 2021-04-06 ENCOUNTER — Other Ambulatory Visit: Payer: Self-pay

## 2021-04-06 ENCOUNTER — Other Ambulatory Visit: Payer: Medicare Other

## 2021-04-06 DIAGNOSIS — N390 Urinary tract infection, site not specified: Secondary | ICD-10-CM | POA: Diagnosis not present

## 2021-04-07 ENCOUNTER — Ambulatory Visit: Payer: Medicare Other | Admitting: Registered Nurse

## 2021-04-07 LAB — URINE CULTURE
MICRO NUMBER:: 12602393
Result:: NO GROWTH
SPECIMEN QUALITY:: ADEQUATE

## 2021-04-08 NOTE — Progress Notes (Signed)
Received urine culture, negative for infection  Results for orders placed or performed in visit on 04/06/21  Urine Culture   Specimen: Urine  Result Value Ref Range   MICRO NUMBER: [id number]    SPECIMEN QUALITY: Adequate    Sample Source NOT GIVEN    STATUS: FINAL    Result: No Growth    Called patient to let her know-she is feeling better!  She does have an appointment to see urogynecology for frequent UTI in about 3 weeks

## 2021-04-09 DIAGNOSIS — Z20822 Contact with and (suspected) exposure to covid-19: Secondary | ICD-10-CM | POA: Diagnosis not present

## 2021-04-13 ENCOUNTER — Encounter: Payer: Self-pay | Admitting: Hematology & Oncology

## 2021-04-15 ENCOUNTER — Telehealth: Payer: Self-pay | Admitting: Family Medicine

## 2021-04-15 DIAGNOSIS — R300 Dysuria: Secondary | ICD-10-CM

## 2021-04-15 NOTE — Telephone Encounter (Signed)
Okay to drop off urine? Last Cx was on 04/06/21.

## 2021-04-15 NOTE — Telephone Encounter (Signed)
Pt scheduled  

## 2021-04-15 NOTE — Telephone Encounter (Signed)
Patient states she believes she has another UTI, and would like to bring urine sample to be checked out. Please advice.

## 2021-04-16 ENCOUNTER — Ambulatory Visit: Payer: Medicare Other | Attending: Family Medicine | Admitting: Physical Therapy

## 2021-04-16 ENCOUNTER — Other Ambulatory Visit: Payer: Medicare Other

## 2021-04-16 ENCOUNTER — Other Ambulatory Visit: Payer: Self-pay

## 2021-04-16 DIAGNOSIS — R208 Other disturbances of skin sensation: Secondary | ICD-10-CM

## 2021-04-16 DIAGNOSIS — R2681 Unsteadiness on feet: Secondary | ICD-10-CM | POA: Diagnosis not present

## 2021-04-16 DIAGNOSIS — M6281 Muscle weakness (generalized): Secondary | ICD-10-CM | POA: Diagnosis not present

## 2021-04-16 DIAGNOSIS — R2689 Other abnormalities of gait and mobility: Secondary | ICD-10-CM | POA: Diagnosis not present

## 2021-04-16 DIAGNOSIS — R293 Abnormal posture: Secondary | ICD-10-CM

## 2021-04-16 NOTE — Therapy (Addendum)
[facility] [address] [place], [place], [address] Phone: [phone]   Fax:  [phone]  Physical Therapy Wheelchair Evaluation  Patient Details  Name: Crystal Lee MRN: [id number] Date of Birth: 09/11/1933 Referring Provider (PT): Bayard Hugger, NP   Encounter Date: 04/16/2021   PT End of Session - 04/16/21 2026     Visit Number 1    Number of Visits 1    Date for PT Re-Evaluation 04/16/21    Authorization Type Medicare A&B; Tricare    PT Start Time 6553    PT Stop Time 1520    PT Time Calculation (min) 72 min    Activity Tolerance Patient tolerated treatment well    Behavior During Therapy WFL for tasks assessed/performed             Past Medical History:  Diagnosis Date   Arthritis    Cancer ([place])    HX BREAST CANCER/ SKIN CANCER   Complication of anesthesia    N/V WITH MORPHINE   Difficulty sleeping    Fractured hip ([place])    LEFT - AUG 2016   GERD (gastroesophageal reflux disease)    Hyperlipidemia    Hypertension    Melanoma ([place])    Neuropathy    Nocturia    Osteopenia    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   PONV (postoperative nausea and vomiting)    PT STATES MORPHINE CAUSED N/V   Rosacea    Sleep apnea    Stage 1 breast cancer, ER+, right ([place]) 07/04/2017    Past Surgical History:  Procedure Laterality Date   23 HOUR [place] STUDY N/A 06/21/2018   Procedure: 24 HOUR [place] STUDY;  Surgeon: Lavena Bullion, DO;  Location: [facility];  Service: Gastroenterology;  Laterality: N/A;  with impedance   ABDOMINAL HYSTERECTOMY  2010   ANAL RECTAL MANOMETRY N/A 09/19/2019   Procedure: ANO RECTAL MANOMETRY;  Surgeon: Mauri Pole, MD;  Location: [facility];  Service: Endoscopy;  Laterality: N/A;   [place]   BIOPSY  06/10/2020   Procedure: BIOPSY;  Surgeon: Lavena Bullion, DO;  Location: [facility];  Service: Gastroenterology;;  EGD  and COLON   BREAST SURGERY     CATARACT EXTRACTION Bilateral 2011   CHOLECYSTECTOMY     COLON RESECTION N/A 06/12/2020   Procedure: LAPAROSCOPIC COLON RESECTION;  Surgeon: [name], MD;  Location: [facility];  Service: General;  Laterality: N/A;   COLONOSCOPY WITH PROPOFOL N/A 06/10/2020   Procedure: COLONOSCOPY WITH PROPOFOL;  Surgeon: Lavena Bullion, DO;  [facility];  Service: Gastroenterology;  Laterality: N/A;   ESOPHAGEAL MANOMETRY N/A 06/21/2018   Procedure: ESOPHAGEAL MANOMETRY (EM);  Surgeon: Lavena Bullion, DO;  [facility];  Service: Gastroenterology;  Laterality: N/A;   ESOPHAGOGASTRODUODENOSCOPY (EGD) WITH PROPOFOL N/A 06/10/2020   Procedure: ESOPHAGOGASTRODUODENOSCOPY (EGD) WITH PROPOFOL;  Surgeon: Lavena Bullion, DO;  [facility];  Service: Gastroenterology;  Laterality: N/A;   GALLBLADDER SURGERY  2015   JOINT REPLACEMENT     RT TOTAL HIP / RT TOTAL KNEE   MASTECTOMY  1998   BILATERAL    [place] IMPEDANCE STUDY  06/21/2018   Procedure: [place] IMPEDANCE STUDY;  Surgeon: Lavena Bullion, DO;  [facility];  Service: Gastroenterology;;   POLYPECTOMY  06/10/2020   Procedure: POLYPECTOMY;  Surgeon: Lavena Bullion, DO;  [facility];  Service: Gastroenterology;;   SKIN CANCER EXCISION  2016   RT SIDE OF NOSE   SUBMUCOSAL TATTOO INJECTION  06/10/2020   Procedure: SUBMUCOSAL TATTOO INJECTION;  Surgeon: Lavena Bullion, DO;  [facility];  Service: Gastroenterology;;   TONSILLECTOMY  1953   TOTAL HIP ARTHROPLASTY  2010   RIGHT   TOTAL HIP ARTHROPLASTY Left 05/09/2015   Procedure: LEFT TOTAL HIP ARTHROPLASTY ANTERIOR APPROACH;  Surgeon: Gaynelle Arabian, MD;  Location[facility];  Service: Orthopedics;  Laterality: Left;   TOTAL KNEE ARTHROPLASTY  2001   TUMOR REMOVAL  2012   ABDOMINAL - NON CANCEROUS    There were no vitals filed for this visit.    Subjective Assessment - 04/16/21 2020     Subjective  Patient was evaluated for power mobility last year - Dec 2021 - was denied for power wheelchair.  Over the past year pt underwent treatment for colon CA and has experienced a further decline in function and mobility.  Pt referred back to [facility] for re-evaluation for power mobility.  Pt arrives alone.    Pertinent History OA, history of breast CA, melanoma skin CA, L hip fx, HLD, HTN, neuropathy, osteoporosis, OSA, lower GI bleed, malignant neoplasm of descending colon, R carpal tunnel syndrome, radial styloid tenosynovitis of LUE, lumbar post-laminectomy syndrome, stroke like symptoms, UTI; Colon resection, L THA, R THA, R TKA, bilat cataract extraction, bilateral mastectomy, back surgery, skin cancer excision    Limitations Standing;Walking;House hold activities    How long can you stand comfortably? 5 minutes    Currently in Pain? Yes                [facility] PT Assessment - 04/16/21 2022       Assessment   Medical Diagnosis M96.1 (ICD-10-CM) - Lumbar post-laminectomy syndrome  R20.2 (ICD-10-CM) - Paresthesia    Referring Provider (PT) Bayard Hugger, NP    Onset Date/Surgical Date 01/01/21    Hand Dominance Right      Precautions   Precaution Comments OA, history of breast CA, melanoma skin CA, L hip fx, HLD, HTN, neuropathy, osteoporosis, OSA, lower GI bleed, malignant neoplasm of descending colon, R carpal tunnel syndrome, radial styloid tenosynovitis of LUE, lumbar post-laminectomy syndrome, stroke like symptoms, UTI; Colon resection, L THA, R THA, R TKA, bilat cataract extraction, bilateral mastectomy, back surgery, skin cancer excision      Balance Screen   Has the patient fallen in the past 6 months No      [place] residence    Living Arrangements Alone      Prior Function   Level of Independence Independent with household mobility with device;Independent with community mobility with device;Independent with homemaking with  ambulation;Independent with transfers;Requires assistive device for independence              Mobility/Seating Evaluation    PATIENT INFORMATION: Name: Crystal Lee  DOB: May 20, 1934  Sex: Female Date seen: 04/16/2021 Time: 14:00  Address:  [address]  [place] [address] Physician: Bayard Hugger, NP This evaluation/justification form will serve as the LMN for the following suppliers: __________________________ Supplier: [employer] Contact Person: Deberah Pelton, Wess Botts Phone:  [phone]   Seating Therapist: Rico Junker, PT, DPT Phone:   [phone]   Phone: [phone]     Spouse/Parent/Caregiver name: Eino Farber - Daughter  Phone number: [phone] Insurance/Payer: Medicare A &B; Tricare     Reason for Referral: To obtain power mobility due to continued functional decline  Patient/Caregiver Goals: To  maintain independent mobility and decrease falls risk  Patient was seen for face-to-face evaluation for new power wheelchair.  Also present was [employer] to discuss recommendations and wheelchair options.  Further paperwork was completed and sent to vendor.  Patient appears to qualify for power mobility device at this time per objective findings.   MEDICAL HISTORY: Diagnosis: Primary Diagnosis: M96.1 (ICD-10-CM) - Lumbar post-laminectomy syndrome Onset: 01/01/2021 Diagnosis: R20.2 (ICD-10-CM) - Paresthesia   [] Progressive Disease Relevant past and future surgeries: Colon resection, L THA, R THA, R TKA, bilat cataract extraction, bilateral mastectomy, back surgery, skin cancer excision   Height: 5'6" Weight: 206 Explain recent changes or trends in weight: Lost 40lb after surgery   History including Falls: Pt reports no falls but has had near falls.  PMH Includes: OA, history of breast CA, melanoma skin CA, L hip fx, HLD, HTN, neuropathy, osteoporosis, OSA, lower GI bleed, malignant neoplasm of descending colon, R carpal tunnel syndrome, radial styloid tenosynovitis of LUE,  lumbar post-laminectomy syndrome, stroke like symptoms, UTI    HOME ENVIRONMENT: [x] House  [] Condo/town home  [] Apartment  [] Assisted Living    [x] Lives Alone []  Lives with Others                                                                                          Hours with caregiver:   [x] Home is accessible to patient           Stairs      [] Yes [x]  No     Ramp [x] Yes [] No Comments:  House is one level; hardwood floors.  Most doors are 30-32" except bathroom.     COMMUNITY ADL: TRANSPORTATION: [x] Car    [] Van    [] Public Transportation    [] Adapted w/c Lift    [] Ambulance    [] Other:       [] Sits in wheelchair during transport  Employment/School:  Specific requirements pertaining to mobility   Other: Pt is still driving, sedan car; would need to purchase a lift for the back of her car to transport in the community    FUNCTIONAL/SENSORY PROCESSING SKILLS:  Handedness:   [x] Right     [] Left    [] NA  Comments:    Functional Processing Skills for Wheeled Mobility [x] Processing Skills are adequate for safe wheelchair operation  Areas of concern than may interfere with safe operation of wheelchair Description of problem   []  Attention to environment      [] Judgment      []  Hearing  []  Vision or visual processing      [] Motor Planning  []  Fluctuations in Behavior      VERBAL COMMUNICATION: [x] WFL receptive [x]  WFL expressive [] Understandable  [] Difficult to understand  [] non-communicative []  Uses an augmented communication device  CURRENT SEATING / MOBILITY: Current Mobility Base:  [x] None [] Dependent [] Manual [] Scooter [] Power  Type of Control:   Manufacturer:  Size:  Age:   Current Condition of Mobility Base:     Current Wheelchair components:    Describe posture in present seating system:        SENSATION and SKIN ISSUES: Sensation [] Intact  [x] Impaired [] Absent  Level of sensation: Burning and  numbess is constant; has progressed up to low back Pressure Relief: Able to  perform effective pressure relief :    [x] Yes  []  No Method: Standing with UE support If not, Why?:   Skin Issues/Skin Integrity Current Skin Issues  [] Yes [x] No [x] Intact []  Red area[]  Open Area  [x] Scar Tissue [x] At risk from prolonged sitting Where    History of Skin Issues  [x] Yes [] No Where  Coccyx from prolonged sitting When  3-4 months ago; healed  Hx of skin flap surgeries  [] Yes [x] No Where   When    Limited sitting tolerance [] Yes [x] No Hours spent sitting in wheelchair daily:   Complaint of Pain:  Please describe: Generalized pain; can be debilitating and prevent patient from ambulating   Swelling/Edema: bilat LE dependent edema   ADL STATUS (in reference to wheelchair use):  Indep Assist Unable Indep with Equip Not assessed Comments  Dressing []  []  []  [x]  []  seated on bed; when reaching for clothes in her closet she has fallen forward into the closet  Eating []  []  []  [x]  []  Eats in recliner; would prefer to eat in dining room but not able to rise from a chair without arm rests  Toileting []  []  []  [x]  []  Requires upper extremity support to transfer safely  Bathing []  []  []  [x]  []  Uses shower chair   Grooming/Hygiene []  []  []  [x]  []  Props on elbows on sink to brush teeth  Meal Prep []  [x]  []  []  []  Attempts to cook but has limited standing tolerance; Has to turn off the oven, go sit down in the living room for 10 minutes and then get back up and cook for a few more minutes before she needs another rest break.  Has everything counter level to avoid reaching high due to falling forwards   IADLS []  [x]  []  []  []  Goes to the grocery store; dependent on shopping cart; non-functional the next day due to pain; no longer able to go out to her mailbox - daughter brings her the mail  Bowel Management: [x] Continent  [] Incontinent  [] Accidents Comments:    Bladder Management: [] Continent  [] Incontinent  [x] Accidents Comments:  accidents if not able to get to the bathroom in time      WHEELCHAIR SKILLS: Manual w/c Propulsion: [] UE or LE strength and endurance sufficient to participate in ADLs using manual wheelchair Arm : [] left [] right   [] Both      Distance:  Foot:  [] left [] right   [] Both  Operate Scooter: []  Strength, hand grip, balance and transfer appropriate for use [] Living environment is accessible for use of scooter  Operate Power w/c:  [x]  Std. Joystick   []  Alternative Controls Indep [x]  Assist []  Dependent/unable []  N/A []   [x] Safe          [x]  Functional      Distance:   Bed confined without wheelchair []  Yes [x]  No   STRENGTH/RANGE OF MOTION:  AROM Range of Motion Strength  Shoulder Limited due to bilat mastectomy 2/5  Elbow WFL 3+/5  Wrist/Hand WFL 3+/5  Hip Limited due to weakness 2/5  Knee [facility] 3+/5  Ankle WFL 4/5     MOBILITY/BALANCE:  []  Patient is totally dependent for mobility      Balance Transfers Ambulation  Sitting Balance: Standing Balance: []  Independent []  Independent/Modified Independent  [x]  WFL     []  WFL []  Supervision [x]  Supervision  []  Uses UE for balance  []  Supervision [x]  Min Assist []  Ambulates with Assist      []   Min Assist $RemoveBe'[x]'REETOmINB$  Min assist $RemoveBe'[]'JbqSOnZTC$  Mod Assist $RemoveBe'[x]'qyfCHxvbq$  Ambulates with Device:      '[]'$  RW  $R'[]'fC$  StW  $Re'[]'mEI$  Cane  $Rem'[x]'EywL$  4 wheeled walker  $Remov'[]'gNEBZb$  Mod Assist $RemoveBe'[]'jGiKonlDL$  Mod assist $RemoveBe'[]'cuCgdfGuD$  Max assist   '[]'$  Max Assist $RemoveBe'[]'KuLJSbwMG$  Max assist $RemoveBe'[]'XwylHPGgq$  Dependent $RemoveBe'[]'EgOMnQntt$  Indep. Short Distance Only  $Rem'[]'CgUD$  Unable $Remov'[]'OAFjuS$  Unable $Remov'[]'uABbIu$  Lift / Sling Required Distance (in feet)     '[]'$  Sliding board $RemoveBefor'[]'vJcTmqnvJnxX$  Unable to Ambulate (see explanation below)  Cardio Status:  $Remove'[x]'kKgIEEP$ Intact  $Remov'[]'oGQVin$  Impaired   '[]'$  NA       Respiratory Status:  $Remove'[x]'wYeKNwg$ Intact   '[]'$ Impaired   '[]'$ NA       Orthotics/Prosthetics:   Comments (Address manual vs power w/c vs scooter): [name] has a mobility limitation that significantly impairs safe, timely participation in one or more mobility related ADL's. Marybelle has been experiencing a significant decline in her mobility over the past year due to undergoing treatment for colon cancer  which has resulted in significant LE paresthesia and LE weakness.  Preeya also has a history of post-laminectomy syndrome which also contributes to Lynnetta's lower extremity paresthesia and weakness.  Emmersen's mobility limitation cannot be compensated for with the use of a cane or walker due to significant LE and UE weakness, impaired lower extremity sensation, balance impairments, and limited standing tolerance.  When performing a Timed Up and Go, Morrissa required 34 seconds to complete with a rollator; a Timed Up and Go of greater than 13.5 seconds indicates a high risk for falls.  Christana lives alone and has experienced multiple near falls when performing MRADLs, especially when reaching for items in her closet or in the kitchen.  Qianna also has a history of osteoporosis, and a fall could result in a significant fracture or other bodily injury.  For safety, Zailynn must perform all her MRADLs in sitting and when attempting to cook, Tymesha must make multiple trips between her kitchen and living room for seated rest breaks due to not having a seat in her kitchen that she can independently stand from.  Shirlette has a history of bilateral mastectomy and carpal tunnel syndrome and does not have sufficient upper extremity strength or range of motion to functionally propel a lightweight or ultra-lightweight manual wheelchair.  Adeana also lacks the lower extremity strength and standing balance to safely transfer to a power scooter (POV).  Marlaine also lacks sufficient upper extremity strength and range of motion to drive a tiller style propulsion system.  Ranika's home would not support the use of a power scooter due to turning radius.  Earlisha would benefit from the use of a power wheelchair to decrease her falls risk and maintain her independence with household mobility and when performing daily MRADLs from a seated position.         Anterior / Posterior Obliquity Rotation-Pelvis   PELVIS    '[]'$  $R'[x]'uD$'[]'$   Neutral Posterior Anterior   '[x]'$  $Re'[]'fbT$'[]'$   WFL Rt elev Lt elev  $Rem'[x]'FKrM$'[]'$'[]'$   WFL Right Left                      Anterior    Anterior     '[x]'$  Fixed $Remo'[]'pKgbm$  Other $Remo'[]'QqzfK$  Partly Flexible $RemoveBeforeD'[]'SkOQDdUxxDHDjZ$  Flexible   '[]'$  Fixed $Remo'[]'XwfrL$  Other $Remo'[]'KVZuZ$  Partly Flexible  $RemoveB'[]'RwKeJkge$  Flexible  $RemoveB'[]'xoVnxlOM$  Fixed $Remo'[]'oowwJ$  Other $Remo'[]'wZMzP$  Partly Flexible  $RemoveB'[]'IkwLzgLc$  Flexible   TRUNK  $Remo'[]'LGNLU$'[x]'$'[]'$   [facility]  Thoracic  Lumbar  Kyphosis Lordosis  $RemoveB'[x]'iIYvFjAB$'[]'$'[]'$   WFL Convex Convex  Right Left [] c-curve [] s-curve [] multiple  [x]  Neutral []  Left-anterior []  Right-anterior     [x]  Fixed []  Flexible []  Partly Flexible []  Other  []  Fixed []  Flexible []  Partly Flexible []  Other  []  Fixed             []  Flexible []  Partly Flexible []  Other    Position Windswept    HIPS          [x]            []               []    Neutral       Abduct        ADduct         [x]           []            []   Neutral Right           Left      []  Fixed []  Subluxed []  Partly Flexible []  Dislocated []  Flexible  []  Fixed []  Other []  Partly Flexible  []  Flexible                 Foot Positioning Knee Positioning      [x]  WFL  [x] Lt [x] Rt [x]  WFL  [x] Lt [x] Rt    KNEES ROM concerns: ROM concerns:    & Dorsi-Flexed [] Lt [] Rt     FEET Plantar Flexed [] Lt [] Rt      Inversion                 [] Lt [] Rt      Eversion                 [] Lt [] Rt     HEAD [x]  Functional [x]  Good Head Control    & [x]  Flexed         []  Extended []  Adequate Head Control    NECK []  Rotated  Lt  []  Lat Flexed Lt []  Rotated  Rt []  Lat Flexed Rt []  Limited Head Control     []  Cervical Hyperextension []  Absent  Head Control     SHOULDERS ELBOWS WRIST& HAND       Left     Right    Left     Right    Left     Right   U/E [] Functional           [] Functional   [] Fisting             [] Fisting      [] elev   [] dep      [] elev   [] dep       [x] pro -[] retract     [x] pro  [] retract [] subluxed             [] subluxed           Goals for Wheelchair Mobility  [x]  Independence with mobility in the home with motor related ADLs (MRADLs)  [x]  Independence with MRADLs in  the community []  Provide dependent mobility  []  Provide recline     [] Provide tilt   Goals for Seating system [x]  Optimize pressure distribution [x]  Provide support needed to facilitate function or safety []  Provide corrective forces to assist with maintaining or improving posture []  Accommodate client's posture:   current seated postures and positions are not flexible or will not tolerate corrective forces []  Client to be independent with relieving pressure in the wheelchair [] Enhance physiological function such  as breathing, swallowing, digestion  Simulation ideas/Equipment trials: State why other equipment was unsuccessful:   MOBILITY BASE RECOMMENDATIONS and JUSTIFICATION: MOBILITY COMPONENT JUSTIFICATION  Manufacturer: Model:    Size: Width Seat Depth  [x] provide transport from point A to B      [x] promote Indep mobility  [x] is not a safe, functional ambulator [x] walker or cane inadequate [] non-standard width/depth necessary to accommodate anatomical measurement []    [] Manual Mobility Base [] non-functional ambulator    [] Scooter/POV  [] can safely operate  [] can safely transfer   [] has adequate trunk stability  [] cannot functionally propel manual w/c  [x] Power Mobility Base  [x] is not a safe, functional ambulator [x] cannot functionally propel manual wheelchair  [x]  cannot functionally and safely operate scooter/POV [x] can safely operate and willing to  [] Stroller Base [] infant/child  [] unable to propel manual wheelchair [] allows for growth [] non-functional ambulator [] non-functional UE [] Indep mobility is not a goal at this time  [] Tilt  [] Forward [] Backward [] Powered tilt  [] Manual tilt  [] change position against gravitational force on head and shoulders  [] change position for pressure relief/cannot weight shift [] transfers  [] management of tone [] rest periods [] control edema [] facilitate postural control  []    [] Recline  [] Power recline on power base [] Manual  recline on manual base  [] accommodate femur to back angle  [] bring to full recline for ADL care  [] change position for pressure relief/cannot weight shift [] rest periods [] repositioning for transfers or clothing/diaper /catheter changes [] head positioning  [] Lighter weight required [] self- propulsion  [] lifting []    [] Heavy Duty required [] user weight greater than 250# [] extreme tone/ over active movement [] broken frame on previous chair []    []  Back  []  Angle Adjustable []  Custom molded  [] postural control [] control of tone/spasticity [] accommodation of range of motion [] UE functional control [] accommodation for seating system []   [] provide lateral trunk support [] accommodate deformity [] provide posterior trunk support [] provide lumbar/sacral support [] support trunk in midline [] Pressure relief over spinal processes  []  Seat Cushion  [] impaired sensation  [] decubitus ulcers present [] history of pressure ulceration [] prevent pelvic extension [] low maintenance  [] stabilize pelvis  [] accommodate obliquity [] accommodate multiple deformity [] neutralize lower extremity position [] increase pressure distribution []    []  Pelvic/thigh support  []  Lateral thigh guide []  Distal medial pad  []  Distal lateral pad []  pelvis in neutral [] accommodate pelvis []  position upper legs []  alignment []  accommodate ROM []  decr adduction [] accommodate tone [] removable for transfers [] decr abduction  []  Lateral trunk Supports []  Lt     []  Rt [] decrease lateral trunk leaning [] control tone [] contour for increased contact [] safety  [] accommodate asymmetry []    []  Mounting hardware  [] lateral trunk supports  [] back   [] seat [] headrest      []  thigh support [] fixed   [] swing away [] attach seat platform/cushion to w/c frame [] attach back cushion to w/c frame [] mount postural supports [] mount headrest  [] swing medial thigh support away [] swing lateral supports away for transfers  []       Armrests  [] fixed [x] adjustable height [] removable   [] swing away  [x] flip back   [] reclining [x] full length pads [] desk    [] pads tubular  [x] provide support with elbow at 90   [] provide support for w/c tray [x] change of height/angles for variable activities [x] remove for transfers [] allow to come closer to table top [x] remove for access to tables []    Hangers/ Leg rests  [] 60 [] 70 [] 90 [] elevating [] heavy duty  [] articulating [] fixed [] lift off [] swing away     [] power [] provide LE support  [] accommodate to hamstring tightness [] elevate legs during recline   []   provide change in position for Legs [] Maintain placement of feet on footplate [] durability [] enable transfers [] decrease edema [] Accommodate lower leg length []    Foot support Footplate    [] Lt  []  Rt  []  Center mount [] flip up     [] depth/angle adjustable [] Amputee adapter    []  Lt     []  Rt [] provide foot support [] accommodate to ankle ROM [] transfers [] Provide support for residual extremity []  allow foot to go under wheelchair base []  decrease tone  []    []  Ankle strap/heel loops [] support foot on foot support [] decrease extraneous movement [] provide input to heel  [] protect foot  Tires: [] pneumatic  [] flat free inserts  [] solid  [] decrease maintenance  [] prevent frequent flats [] increase shock absorbency [] decrease pain from road shock [] decrease spasms from road shock []    []  Headrest  [] provide posterior head support [] provide posterior neck support [] provide lateral head support [] provide anterior head support [] support during tilt and recline [] improve feeding   [] improve respiration [] placement of switches [] safety  [] accommodate ROM  [] accommodate tone [] improve visual orientation  []  Anterior chest strap []  Vest []  Shoulder retractors  [] decrease forward movement of shoulder [] accommodation of TLSO [] decrease forward movement of trunk [] decrease shoulder elevation [] added abdominal  support [] alignment [] assistance with shoulder control  []    Pelvic Positioner [x] Belt [] SubASIS bar [] Dual Pull [] stabilize tone [x] decrease falling out of chair/ **will not Decr potential for sliding due to pelvic tilting [] prevent excessive rotation [] pad for protection over boney prominence [] prominence comfort [] special pull angle to control rotation []    Upper Extremity Support [] L   []  R [] Arm trough    [] hand support []  tray       [] full tray [] swivel mount [] decrease edema      [] decrease subluxation   [] control tone   [] placement for AAC/Computer/EADL [] decrease gravitational pull on shoulders [] provide midline positioning [] provide support to increase UE function [] provide hand support in natural position [] provide work surface   POWER WHEELCHAIR CONTROLS  [x] Proportional  [] Non-Proportional Type  [] Left  [x] Right [x] provides access for controlling wheelchair   [] lacks motor control to operate proportional drive control [] unable to understand proportional controls  Actuator Control Module  [] Single  [] Multiple   [] Allow the client to operate the power seat function(s) through the joystick control   [] Safety Reset Switches [] Used to change modes and stop the wheelchair when driving in latch mode    [] Upgraded Electronics   [] programming for accurate control [] progressive Disease/changing condition [] non-proportional drive control needed [] Needed in order to operate power seat functions through joystick control   [] Display box [] Allows user to see in which mode and drive the wheelchair is set  [] necessary for alternate controls    [] Digital interface electronics [] Allows w/c to operate when using alternative drive controls  [] ASL Head Array [] Allows client to operate wheelchair  through switches placed in tri-panel headrest  [] Sip and puff with tubing kit [] needed to operate sip and puff drive controls  [] Upgraded tracking electronics [] increase safety when  driving [] correct tracking when on uneven surfaces  [x] Mount for switches or joystick [x] Attaches switches to w/c  [x] Swing away for access or transfers [] midline for optimal placement [] provides for consistent access  [] Attendant controlled joystick plus mount [] safety [] long distance driving [] operation of seat functions [] compliance with transportation regulations []      Rear wheel placement/Axle adjustability [] None [] semi adjustable [] fully adjustable  [] improved UE access to wheels [] improved stability [] changing angle in space for improvement of postural stability [] 1-arm drive access [] amputee pad placement []   Wheel rims/ hand rims  [] metal  [] plastic coated [] oblique projections [] vertical projections [] Provide ability to propel manual wheelchair  []  Increase self-propulsion with hand weakness/decreased grasp  Push handles [] extended  [] angle adjustable  [] standard [] caregiver access [] caregiver assist [] allows "hooking" to enable increased ability to perform ADLs or maintain balance  One armed device  [] Lt   [] Rt [] enable propulsion of manual wheelchair with one arm   []      Brake/wheel lock extension []  Lt   []  Rt [] increase indep in applying wheel locks   [] Side guards [] prevent clothing getting caught in wheel or becoming soiled []  prevent skin tears/abrasions  Battery:  [x] to power wheelchair   Other:     The above equipment has a life- long use expectancy. Growth and changes in medical and/or functional conditions would be the exceptions. This is to certify that the therapist has no financial relationship with durable medical provider or manufacturer. The therapist will not receive remuneration of any kind for the equipment recommended in this evaluation.   Patient has mobility limitation that significantly impairs safe, timely participation in one or more mobility related ADL's.  (bathing, toileting, feeding, dressing, grooming, moving from room to room)                                                              [x]  Yes []  No Will mobility device sufficiently improve ability to participate and/or be aided in participation of MRADL's?         [x]  Yes []  No Can limitation be compensated for with use of a cane or walker?                                                                                []  Yes [x]  No Does patient or caregiver demonstrate ability/potential ability & willingness to safely use the mobility device?   [x]  Yes []  No Does patient's home environment support use of recommended mobility device?                                                    [x]  Yes []  No Does patient have sufficient upper extremity function necessary to functionally propel a manual wheelchair?    []  Yes [x]  No Does patient have sufficient strength and trunk stability to safely operate a POV (scooter)?                                  []  Yes [x]  No Does patient need additional features/benefits provided by a power wheelchair for MRADL's in the home?       [x]  Yes []  No Does the patient demonstrate the ability to safely use a power wheelchair?                                                              [  x] Yes []  No  Therapist Name Printed:  Date:   Therapist's Signature:   Date:   Supplier's Name Printed:  Date:   Supplier's Signature:   Date:  Patient/Caregiver Signature:   Date:     This is to certify that I have read this evaluation and do agree with the content within:   51 Name Printed:   92 Signature:  Date:     This is to certify that I, the above signed therapist have the following affiliations: []  This DME provider []  Manufacturer of recommended equipment []  Patient's long term care facility [x]  None of the above        Objective measurements completed on examination: See above findings.     PT Education - 04/16/21 2024     Education Details power mobility recommendations; process for obtaining power w/c; also discussed  how to obtain a referral to PT to address declining mobility and prevent falls    Person(s) Educated Patient    Methods Explanation    Comprehension Verbalized understanding              Plan - 04/16/21 2027     Clinical Impression Statement Pt is an 87 year old female referred to [facility] for evaluation for power mobility due to further decline in strength and mobility after treatment for colon CA this year.  PT evaluation revealed the following impairments and functional limitations: impaired LE sensation, impaired bilateral UE and LE strength, impaired posture, impaired standing tolerance and endurance, impaired standing balance, dizziness with body turns, and abnormal gait.  Pt required >34 seconds to complete a TUG with her rollator indicating high risk for falls.  Pt lives alone would benefit from the use of a group 2 power wheelchair to continue to perform MRADLs independently and to decrease patient's risk for falls in her home and community.    Personal Factors and Comorbidities Age;Fitness;Past/Current Experience;Comorbidity 3+;Social Background    Comorbidities OA, history of breast CA, melanoma skin CA, L hip fx, HLD, HTN, neuropathy, osteoporosis, OSA, lower GI bleed, malignant neoplasm of descending colon, R carpal tunnel syndrome, radial styloid tenosynovitis of LUE, lumbar post-laminectomy syndrome, stroke like symptoms, UTI  Colon resection, L THA, R THA, R TKA, bilat cataract extraction, bilateral mastectomy, back surgery, skin cancer excision    Examination-Activity Limitations Locomotion Level;Dressing;Reach Overhead;Stand    Examination-Participation Restrictions Community Activity;Meal Prep;Shop    Stability/Clinical Decision Making Evolving/Moderate complexity    Clinical Decision Making Moderate    Rehab Potential Good    PT Frequency One time visit    PT Duration Other (comment)   one time visit for power w/c evaluation   Consulted and Agree with Plan of Care Patient              Patient will benefit from skilled therapeutic intervention in order to improve the following deficits and impairments:  Abnormal gait, Decreased activity tolerance, Decreased balance, Decreased mobility, Decreased strength, Dizziness, Difficulty walking, Impaired sensation, Postural dysfunction, Pain  Visit Diagnosis: Other abnormalities of gait and mobility  Unsteadiness on feet  Muscle weakness (generalized)  Other disturbances of skin sensation  Abnormal posture     Problem List Patient Active Problem List   Diagnosis Date Noted   Stroke-like symptoms 03/25/2021   Sleep apnea    Rosacea    PONV (postoperative nausea and vomiting)    Nocturia    Neuropathy    Melanoma ([place])    Hypertension    Hyperlipidemia    Fractured  hip ([place])    Difficulty sleeping    Complication of anesthesia    Cancer ([place])    Arthritis    Malignant neoplasm of descending colon ([place])    Gastric polyps    Colonic mass    Diverticulosis of colon without hemorrhage    Adenomatous polyp of sigmoid colon    Acute lower GI bleeding 06/09/2020   Lower GI bleed 06/08/2020   Acute blood loss anemia 06/08/2020   HTN (hypertension) 06/08/2020   Constipation    Encounter for orthopedic follow-up care 06/18/2019   Urinary tract infection 04/20/2019   Acute pain of right wrist 03/28/2019   Tenosynovitis, wrist 03/28/2019   Acquired trigger finger of right little finger 01/15/2019   Carpal tunnel syndrome of right wrist 01/15/2019   Aftercare 08/17/2018   Lumbar post-laminectomy syndrome 07/31/2018   Heartburn    Fracture of superior pubic ramus ([place]) 06/16/2018   Radial styloid tenosynovitis of left hand 05/16/2018   Pain of left hand 04/20/2018   Osteopenia 04/15/2018   Dyslipidemia 02/02/2018   GERD (gastroesophageal reflux disease) 02/02/2018   Trochanteric bursitis of left hip 12/23/2017   Primary osteoarthritis of left knee 10/17/2017   History of right knee joint  replacement 10/17/2017   Pain in both lower extremities 08/02/2017   Stage 1 breast cancer, ER+, right ([place]) 07/04/2017   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 [place], [place], [address] Phone: [phone]   Fax:  [phone]  Name: Hanny Elsberry MRN: [id number] Date of Birth: February 27, 1934

## 2021-04-21 NOTE — Progress Notes (Signed)
Pt brought urine specimen in on 04/16/21 in a medicine bottle. We are not allowed to accept specimens in containers that are not sterile. I explained to patient that we would not be able to accept the specimen and could she provide a sample while she was at the office. She said she would not be able to. I gave her 2 sterile urine cups with her demographic info so she could collect another specimen and have another container at home if this happened in the future.  Patient never returned specimen. I spoke with patient today to see if she was still planning to return the sample and she stated she went somewhere else for treatment and did not need to return specimen at this time.

## 2021-04-30 ENCOUNTER — Encounter: Payer: Self-pay | Admitting: *Deleted

## 2021-04-30 NOTE — Progress Notes (Signed)
Patient's CTs have been scheduled with her follow up appointment on 06/29/2020.  Oncology Nurse Navigator Documentation  Oncology Nurse Navigator Flowsheets 04/30/2021  Confirmed Diagnosis Date -  Diagnosis Status -  Planned Course of Treatment -  Phase of Treatment -  Chemotherapy Pending- Reason: -  Chemotherapy Actual Start Date: -  Navigator Follow Up Date: 06/29/2021  Navigator Follow Up Reason: Follow-up Appointment;Scan Review  Navigator Location [facility]  Navigator Encounter Type Appt/Treatment Plan Review  Telephone -  Treatment Initiated Date -  Patient Visit Type MedOnc  Treatment Phase Active Tx  Barriers/Navigation Needs Coordination of Care;Education;Anxiety;Transportation  Education -  Interventions None Required  Acuity Level 2-Minimal Needs (1-2 Barriers Identified)  Coordination of Care -  Education Method -  Time Spent with Patient 15

## 2021-05-01 ENCOUNTER — Other Ambulatory Visit: Payer: Self-pay

## 2021-05-01 ENCOUNTER — Other Ambulatory Visit (HOSPITAL_COMMUNITY)
Admission: RE | Admit: 2021-05-01 | Discharge: 2021-05-01 | Disposition: A | Discharge status: Home / Self Care | Payer: Medicare Other | Source: Ambulatory Visit | Attending: Obstetrics and Gynecology | Admitting: Obstetrics and Gynecology

## 2021-05-01 ENCOUNTER — Encounter: Payer: Self-pay | Admitting: Obstetrics and Gynecology

## 2021-05-01 ENCOUNTER — Ambulatory Visit (INDEPENDENT_AMBULATORY_CARE_PROVIDER_SITE_OTHER): Payer: Medicare Other | Admitting: Obstetrics and Gynecology

## 2021-05-01 VITALS — BP 145/73 | HR 80 | Ht 66.0 in | Wt 206.0 lb

## 2021-05-01 DIAGNOSIS — R350 Frequency of micturition: Secondary | ICD-10-CM | POA: Diagnosis not present

## 2021-05-01 DIAGNOSIS — N390 Urinary tract infection, site not specified: Secondary | ICD-10-CM | POA: Diagnosis not present

## 2021-05-01 DIAGNOSIS — R82998 Other abnormal findings in urine: Secondary | ICD-10-CM

## 2021-05-01 LAB — POCT URINALYSIS DIPSTICK
Appearance: ABNORMAL
Bilirubin, UA: NEGATIVE
Blood, UA: NEGATIVE
Glucose, UA: NEGATIVE
Ketones, UA: NEGATIVE
Nitrite, UA: NEGATIVE
Protein, UA: NEGATIVE
Spec Grav, UA: 1.02 (ref 1.010–1.025)
Urobilinogen, UA: 0.2 E.U./dL
pH, UA: 5.5 (ref 5.0–8.0)

## 2021-05-01 MED ORDER — SULFAMETHOXAZOLE-TRIMETHOPRIM 400-80 MG PO TABS: 1.0000 | ORAL_TABLET | Freq: Every day | ORAL | 1 refills | Status: DC

## 2021-05-01 MED ORDER — PHENAZOPYRIDINE HCL 95 MG PO TABS: 95.0000 mg | ORAL_TABLET | Freq: Three times a day (TID) | ORAL | 1 refills | Status: DC | PRN

## 2021-05-01 MED ORDER — SULFAMETHOXAZOLE-TRIMETHOPRIM 400-80 MG PO TABS: 1.0000 | ORAL_TABLET | Freq: Every day | ORAL | 5 refills | Status: DC

## 2021-05-01 NOTE — Addendum Note (Signed)
Addended by: Jaquita Folds on: 05/01/2021 01:36 PM   Modules accepted: Orders

## 2021-05-01 NOTE — Progress Notes (Signed)
[facility] New Patient Evaluation and Consultation  Referring Provider: Darreld Mclean, MD PCP: Darreld Mclean, MD Date of Service: 05/01/2021  SUBJECTIVE Chief Complaint: New Patient (Initial Visit) Crystal Lee is a 87 y.o. female here for a consult on recurrent UTI./)  History of Present Illness: Crystal Lee is a 87 y.o. White or Caucasian female seen in consultation at the request of Dr. Lorelei Pont for evaluation of urinary tract infections.    Review of records significant for:  Urine cultures:  04/06/21- no growth 04/01/21- no growth 03/25/21- >100,00 E. Coli 02/26/21- >100,000 E. Coli 8/18/220 10-49,000 E.Coli  Urinary Symptoms: Leaks urine with cough/ sneeze, exercise, lifting, going from sitting to standing, with movement to the bathroom, and without sensation. UUI> SUI Leaks several times per day.  Pad use: 4 pads per day.   She is bothered by her UI symptoms. Has been on Myrbetriq for OAB- did not help her symptoms. Has also tried vesicare.  Has done posterior tibial nerve stimulation and did not see improvement.   Day time voids- sometimes every hour.  Nocturia: 5 times per night to void. Voiding dysfunction: she does not empty her bladder well.  does not use a catheter to empty bladder.  When urinating, she feels a weak stream, difficulty starting urine stream, and the need to urinate multiple times in a row Drinks: avoids drinking tea and coffee because this causes urgency  UTIs: several UTI's in the last year.   Has had several cystoscopies at [facility]. Previously done prophylactic antibiotics several years ago.  Denies history of blood in urine and kidney or bladder stones  Pelvic Organ Prolapse Symptoms:                  She Denies a feeling of a bulge the vaginal area.  Bowel Symptom: Bowel movements: every 3-4 days Stool consistency: hard Straining: yes.  Splinting: no.  Incomplete evacuation: yes.  She Denies accidental bowel  leakage / fecal incontinence Bowel regimen: fiber, stool softener, and miralax Last colonoscopy: Date 05/2020, Results- cancer, had colon resection  Sexual Function Sexually active: no.    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   PONV (postoperative nausea and vomiting)    PT STATES MORPHINE CAUSED N/V   Rosacea    Sleep apnea    Stage 1 breast cancer, ER+, right ([place]) 07/04/2017     Past Surgical History:   Past Surgical History:  Procedure Laterality Date   106 HOUR [place] STUDY N/A 06/21/2018   Procedure: 24 HOUR [place] STUDY;  Surgeon: Lavena Bullion, DO;  Location: [facility];  Service: Gastroenterology;  Laterality: N/A;  with impedance   ABDOMINAL HYSTERECTOMY  2010   ANAL RECTAL MANOMETRY N/A 09/19/2019   Procedure: ANO RECTAL MANOMETRY;  Surgeon: Harl Bowie  V, MD;  Location: [facility];  Service: Endoscopy;  Laterality: N/A;   Center   BIOPSY  06/10/2020   Procedure: BIOPSY;  Surgeon: Lavena Bullion, DO;  Location: [facility];  Service: Gastroenterology;;  EGD and COLON   BREAST SURGERY     CATARACT EXTRACTION Bilateral 2011   CHOLECYSTECTOMY     COLON RESECTION N/A 06/12/2020   Procedure: LAPAROSCOPIC COLON RESECTION;  Surgeon: [name], MD;  Location: [facility];  Service: General;  Laterality: N/A;   COLONOSCOPY WITH PROPOFOL N/A 06/10/2020    Procedure: COLONOSCOPY WITH PROPOFOL;  Surgeon: Lavena Bullion, DO;  Location: [facility];  Service: Gastroenterology;  Laterality: N/A;   ESOPHAGEAL MANOMETRY N/A 06/21/2018   Procedure: ESOPHAGEAL MANOMETRY (EM);  Surgeon: Lavena Bullion, DO;  Location: [facility];  Service: Gastroenterology;  Laterality: N/A;   ESOPHAGOGASTRODUODENOSCOPY (EGD) WITH PROPOFOL N/A 06/10/2020   Procedure: ESOPHAGOGASTRODUODENOSCOPY (EGD) WITH PROPOFOL;  Surgeon: Lavena Bullion, DO;  Location: [facility];  Service: Gastroenterology;  Laterality: N/A;   GALLBLADDER SURGERY  2015   JOINT REPLACEMENT     RT TOTAL HIP / RT TOTAL KNEE   MASTECTOMY  1998   BILATERAL    [place] IMPEDANCE STUDY  06/21/2018   Procedure: [place] IMPEDANCE STUDY;  Surgeon: Lavena Bullion, DO;  Location: [facility];  Service: Gastroenterology;;   POLYPECTOMY  06/10/2020   Procedure: POLYPECTOMY;  Surgeon: Lavena Bullion, DO;  Location: [facility];  Service: Gastroenterology;;   SKIN CANCER EXCISION  2016   RT SIDE OF NOSE   SUBMUCOSAL TATTOO INJECTION  06/10/2020   Procedure: SUBMUCOSAL TATTOO INJECTION;  Surgeon: Lavena Bullion, DO;  Location: [facility];  Service: Gastroenterology;;   [place]  2010   RIGHT   TOTAL HIP ARTHROPLASTY Left 05/09/2015   Procedure: LEFT TOTAL HIP ARTHROPLASTY ANTERIOR APPROACH;  Surgeon: Gaynelle Arabian, MD;  Location: [facility];  Service: Orthopedics;  Laterality: Left;   TOTAL KNEE ARTHROPLASTY  2001   TUMOR REMOVAL  2012   ABDOMINAL - NON CANCEROUS     Past OB/GYN History: OB History  Gravida Para Term Preterm AB Living  5       2 3   SAB IAB Ectopic Multiple Live Births  2       3    # Outcome Date GA Lbr Len/2nd Weight Sex Delivery Anes PTL Lv  5 Gravida           4 Gravida           3 Gravida           2 SAB           1 SAB             Vaginal deliveries: 3,  Forceps/ Vacuum deliveries: 0, Cesarean section: 0 Menopausal: Yes, Denies  vaginal bleeding since menopause Any history of abnormal pap smears: no.   Medications: She has a current medication list which includes the following prescription(s): aspirin ec, atorvastatin, estradiol, hydrocodone-acetaminophen, losartan, multivitamin adults, phenazopyridine, phenazopyridine, and sulfamethoxazole-trimethoprim.   Allergies: Patient is allergic to darvon [propoxyphene], adhesive [tape], and morphine and related.   Social History:  Social History   Tobacco Use   Smoking status: Former    Packs/day: 0.50    Years: 20.00    Pack years: 10.00    Types: Cigarettes    Quit date: 02/03/1976    Years since quitting:  45.2   Smokeless tobacco: Never  Vaping Use   Vaping Use: Never used  Substance Use Topics   Alcohol use: No   Drug use: No    Relationship status: widowed She lives alone She is not employed. Regular exercise: Yes: walking History of abuse: No  Family History:   Family History  Problem Relation Age of Onset   Other Mother        complications from flu   Other Father        unsure of cause   Colon cancer Neg Hx      Review of Systems: Review of Systems  Constitutional:  Positive for malaise/fatigue. Negative for fever and weight loss.  Respiratory:  Negative for cough, shortness of breath and wheezing.   Cardiovascular:  Positive for leg swelling. Negative for chest pain and palpitations.  Gastrointestinal:  Positive for abdominal pain. Negative for blood in stool.  Genitourinary:  Positive for dysuria.  Musculoskeletal:  Positive for myalgias.  Skin:  Negative for rash.  Neurological:  Negative for dizziness and headaches.  Endo/Heme/Allergies:  Does not bruise/bleed easily.  Psychiatric/Behavioral:  Negative for depression. The patient is not nervous/anxious.     OBJECTIVE Physical Exam: Vitals:   05/01/21 1050  BP: (!) 145/73  Pulse: 80  Weight: 206 lb (93.4 kg)  Height: 5\' 6"  (1.676 m)    Physical Exam Constitutional:       General: She is not in acute distress. Pulmonary:     Effort: Pulmonary effort is normal.  Abdominal:     General: There is no distension.     Palpations: Abdomen is soft.     Tenderness: There is no abdominal tenderness. There is no rebound.     Hernia: A hernia is present.  Musculoskeletal:        General: No swelling. Normal range of motion.  Skin:    General: Skin is warm and dry.     Findings: No rash.  Neurological:     Mental Status: She is alert and oriented to person, place, and time.  Psychiatric:        Mood and Affect: Mood normal.        Behavior: Behavior normal.     GU / Detailed Urogynecologic Evaluation:  Pelvic Exam: Normal external female genitalia, erythema over labia majora; Bartholin's and Skene's glands normal in appearance; urethral meatus normal in appearance, no urethral masses or discharge.   CST: negative  s/p hysterectomy: Speculum exam reveals normal vaginal mucosa with  atrophy and normal vaginal cuff.  Adnexa no mass, fullness, tenderness.    Pelvic floor strength II/V  Pelvic floor musculature: Right levator non-tender, Right obturator non-tender, Left levator non-tender, Left obturator non-tender  POP-Q:  Not performed, no prolapse  Rectal Exam:  Normal external rectum  Post-Void Residual (PVR) by Bladder Scan: In order to evaluate bladder emptying, we discussed obtaining a postvoid residual and she agreed to this procedure.  Procedure: The ultrasound unit was placed on the patient's abdomen in the suprapubic region after the patient had voided. A PVR of 3 ml was obtained by bladder scan.  Laboratory Results: POC urine: small leukocytes   ASSESSMENT AND PLAN Ms. Belmonte is a 87 y.o. with:  1. Leukocytes in urine   2. Recurrent UTI   3. Urinary frequency    - will send urine today for culture due to presence of leukocytes - For treatment of recurrent urinary tract infections, we discussed management of recurrent UTIs including  prophylaxis  with a daily low dose antibiotic, transvaginal estrogen therapy, D-mannose, and cranberry supplements.   - Has concerns about vaginal estrogen due to history of estrogen positive breast cancer. We discussed that this is locally acting with minimal systemic absorption, but will defer at this time.  - She will start bactrim 1 tab oral daily for prophylaxis for 6 months.  - Has taken cranberry in the past but this upset her stomach. Advised to start D-mannose over the counter to help with UTI prevention.  - Also gave rx for pyridium to help with bladder pain as needed.   Return 6 months or sooner if needed  Jaquita Folds, MD

## 2021-05-01 NOTE — Patient Instructions (Addendum)
Over the counter, you can get D-mannose, which can help prevent urinary tract infections.   Start bactrim once daily for 6 months for urinary tract infections.   Use coconut oil on the labia as needed for vaginal moisture.

## 2021-05-02 LAB — URINE CULTURE: Culture: NO GROWTH

## 2021-05-05 ENCOUNTER — Telehealth: Payer: Self-pay | Admitting: Neurology

## 2021-05-05 ENCOUNTER — Other Ambulatory Visit: Payer: Self-pay | Admitting: Obstetrics and Gynecology

## 2021-05-05 ENCOUNTER — Ambulatory Visit (INDEPENDENT_AMBULATORY_CARE_PROVIDER_SITE_OTHER): Payer: Medicare Other | Admitting: Neurology

## 2021-05-05 ENCOUNTER — Encounter: Payer: Self-pay | Admitting: Neurology

## 2021-05-05 VITALS — BP 160/79 | HR 83 | Ht 66.0 in | Wt 206.5 lb

## 2021-05-05 DIAGNOSIS — F03A0 Unspecified dementia, mild, without behavioral disturbance, psychotic disturbance, mood disturbance, and anxiety: Secondary | ICD-10-CM

## 2021-05-05 DIAGNOSIS — M8589 Other specified disorders of bone density and structure, multiple sites: Secondary | ICD-10-CM

## 2021-05-05 DIAGNOSIS — R52 Pain, unspecified: Secondary | ICD-10-CM | POA: Diagnosis not present

## 2021-05-05 DIAGNOSIS — N390 Urinary tract infection, site not specified: Secondary | ICD-10-CM

## 2021-05-05 DIAGNOSIS — R269 Unspecified abnormalities of gait and mobility: Secondary | ICD-10-CM | POA: Diagnosis not present

## 2021-05-05 HISTORY — DX: Unspecified dementia, mild, without behavioral disturbance, psychotic disturbance, mood disturbance, and anxiety: F03.A0

## 2021-05-05 HISTORY — DX: Pain, unspecified: R52

## 2021-05-05 MED ORDER — NITROFURANTOIN MONOHYD MACRO 100 MG PO CAPS: 100.0000 mg | ORAL_CAPSULE | Freq: Every day | ORAL | 1 refills | Status: DC

## 2021-05-05 NOTE — Telephone Encounter (Signed)
Please call her daughter, I have ordered a bone density scan to rule out osteoporosis, because her reported history of diffuse body achy pain, history of multiple fracture

## 2021-05-05 NOTE — Telephone Encounter (Signed)
Attempted to call pt, unable to LVM °

## 2021-05-05 NOTE — Progress Notes (Signed)
Previous culture was resistant to bactrim. Therefore will DC bactrim and change prophylactic antibiotics to macrobid 100mg  daily. Patient was informed and will throw out the pills so she does not get confused.   Jaquita Folds, MD

## 2021-05-05 NOTE — Progress Notes (Signed)
Chief Complaint  Patient presents with   Hospitalization Follow-up    Rm 15. Alone. PCP is Dr. Janett Billow Copland. Hospital f/u/Saw Dr. Erlinda Hong, f/u with Dr Dhillon [place]/Discharged home.      ASSESSMENT AND PLAN  Sotiria Keast is a 87 y.o. female   Dementia without behavior change,  MoCA examination 18/30  MRI of the brain showed evidence of generalized atrophy mild supratentorium small vessel disease, evidence of amyloid angiopathy,  Most consistent with central nervous system degenerative disorder, likely mild to moderate Alzheimer's disease,  She has significant visual spatial disorientation, short-term memory loss, I have discussed with patient and her daughter, suggested her not driving, Gait abnormality  Likely multiple factorial, including aging, deconditioning, diffuse body achy pain, joints pain, hyperreflexia, bilateral Babinski signs,  MRI of cervical spine to rule out cervical spondylitic myelopathy  She reported multiple fracture without clear injury, bone density scan for possible osteoporosis,   DIAGNOSTIC DATA (LABS, IMAGING, TESTING) - I reviewed patient records, labs, notes, testing and imaging myself where available. 1. No evidence of acute intracranial abnormality, including infarct. 2. Numerous tiny scattered foci of susceptibility artifact throughout the cortex bilaterally, greatest in the parieto-occipital lobes. While nonspecific, findings could relate to prior microhemorrhages from amyloid angiopathy or prior microemboli. The distribution is atypical for hypertensive hemorrhages.    MEDICAL HISTORY:  Lajoya Dombek is a 87 year old female, seen in request by Dr. Rosalin Hawking, to follow-up hospital discharge, her she primary care physician is Dr. Lorelei Pont, Gay Filler, is accompanied by her daughter at today's visit on May 05, 2021  I reviewed and summarized the referring note.  Past medical history Hypertension Hyperlipidemia Obstructive sleep apnea History of breast  and colon cancer, in remission  I saw her initially in 2018-19 for her complaints, bilateral lower extremity paresthesia, bilateral leg pain,  EMG nerve conduction study then showed no large fiber peripheral neuropathy  I personally reviewed MRI of lumbar spine, multiple degenerative changes, but no significant canal or foraminal stenosis  She still lives alone, was driving until March 25, 2021, when she was found to have sudden onset confusion, difficulty reading, questionable right-sided symptoms, she was sent to hospital,  Extensive evaluation, personally reviewed MRI of the brain in October 2022, no acute abnormality, mild generalized atrophy, supratentorium small vessel disease, evidence of small hemorrhage consistent with amyloid angiopathy,  EEG was normal, UA and urine culture showed E. coli, resistant to bacterium, she was treated with Keflex 500 twice a day, was seen by urogynecologist Dr. Wannetta Sender, was put on Bactrim prophylactic treatment for 6 months, she had a history of frequent UTI  Laboratory evaluation showed LDL 91, A1c 5.0, normal CBC, hemoglobin of 13.4, CMP, creatinine of 0.6, she was discharged with aspirin 81 mg daily, and Lipitor 20 mg daily  Today her main concern is diffuse body achy pain, worsening gait abnormality, she reported a history of pelvic fracture, lower extremity fracture without clear injury, she does have urinary urgency, frequency  CT of abdomen pelvic without contrast September 2022, subtle nodularity of liver contour suggestive of cirrhosis, apparently thickening in the cecal tip, suggest continued follow-up,  Patient denies significant memory loss, but today's MoCA examination was 18/30  PHYSICAL EXAM:   Vitals:   05/05/21 1138  BP: (!) 160/79  Pulse: 83  Weight: 206 lb 8 oz (93.7 kg)  Height: 5\' 6"  (1.676 m)   Not recorded     Body mass index is 33.33 kg/m.  PHYSICAL EXAMNIATION:  Gen: NAD, conversant, well  nourised, well  groomed                     Cardiovascular: Regular rate rhythm, no peripheral edema, warm, nontender. Eyes: Conjunctivae clear without exudates or hemorrhage Neck: Supple, no carotid bruits. Pulmonary: Clear to auscultation bilaterally   NEUROLOGICAL EXAM:  MENTAL STATUS: Speech:    Speech is normal; fluent and spontaneous with normal comprehension.  Cognition:      Montreal Cognitive Assessment  05/05/2021  Visuospatial/ Executive (0/5) 2  Naming (0/3) 3  Attention: Read list of digits (0/2) 2  Attention: Read list of letters (0/1) 1  Attention: Serial 7 subtraction starting at 100 (0/3) 1  Language: Repeat phrase (0/2) 1  Language : Fluency (0/1) 0  Abstraction (0/2) 2  Delayed Recall (0/5) 0  Orientation (0/6) 6  Total 18      CRANIAL NERVES: CN II: Visual fields are full to confrontation. Pupils are round equal and briskly reactive to light. CN III, IV, VI: extraocular movement are normal. No ptosis. CN V: Facial sensation is intact to light touch CN VII: Face is symmetric with normal eye closure  CN VIII: Hearing is normal to causal conversation. CN IX, X: Phonation is normal. CN XI: Head turning and shoulder shrug are intact  MOTOR: Mild bilateral hands posturing tremor, no significant rigidity, no significant upper or lower extremity proximal and distal muscle weakness  REFLEXES: Reflexes are 3 and symmetric at the biceps, triceps, knees, and ankles. Plantar responses are extensor bilaterally  SENSORY: Intact to light touch, pinprick and vibratory sensation are intact in fingers and toes.  COORDINATION: There is no trunk or limb dysmetria noted.  GAIT/STANCE: She needs push-up to get up from seated position, kyphosis, careful, mildly unsteady  REVIEW OF SYSTEMS:  Full 14 system review of systems performed and notable only for as above All other review of systems were negative.   ALLERGIES: Allergies  Allergen Reactions   Darvon [Propoxyphene] Nausea And  Vomiting and Palpitations    Darvocet Causes Sweats   Adhesive [Tape] Rash   Morphine And Related Nausea And Vomiting    HOME MEDICATIONS: Current Outpatient Medications  Medication Sig Dispense Refill   aspirin EC 81 MG tablet Take 81 mg by mouth daily. Swallow whole.     atorvastatin (LIPITOR) 20 MG tablet Take 1 tablet (20 mg total) by mouth daily. 30 tablet 0   estradiol (ESTRACE VAGINAL) 0.1 MG/GM vaginal cream Apply 1gm vaginally 1-3x a week as needed for comfort (Patient taking differently: Place 1 g vaginally See admin instructions. 1g vaginally once to three times a week as needed for comfort with UTI) 42.5 g 12   HYDROcodone-acetaminophen (NORCO) 10-325 MG tablet Take 1 tablet by mouth every 6 (six) hours as needed. Do Not Fill Before 05/02/2021 120 tablet 0   losartan (COZAAR) 25 MG tablet Take 1 tablet (25 mg total) by mouth daily. 90 tablet 3   Multiple Vitamins-Minerals (MULTIVITAMIN ADULTS) TABS Take 1 tablet by mouth daily.     phenazopyridine (PYRIDIUM) 200 MG tablet Take 1 tablet (200 mg total) by mouth 3 (three) times daily as needed for pain. 40 tablet 0   phenazopyridine (PYRIDIUM) 95 MG tablet Take 1 tablet (95 mg total) by mouth 3 (three) times daily as needed for pain. 90 tablet 1   sulfamethoxazole-trimethoprim (BACTRIM) 400-80 MG tablet Take 1 tablet by mouth daily. 90 tablet 1   No current facility-administered medications for this visit.    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   PONV (postoperative nausea and vomiting)    PT STATES MORPHINE CAUSED  N/V   Rosacea    Sleep apnea    Stage 1 breast cancer, ER+, right ([place]) 07/04/2017    PAST SURGICAL HISTORY: Past Surgical History:  Procedure Laterality Date   77 HOUR [place] STUDY N/A 06/21/2018   Procedure: 24 HOUR [place] STUDY;  Surgeon: Lavena Bullion, DO;  [facility];  Service: Gastroenterology;  Laterality: N/A;  with impedance   ABDOMINAL HYSTERECTOMY  2010   ANAL RECTAL MANOMETRY N/A 09/19/2019   Procedure: ANO RECTAL MANOMETRY;  Surgeon: Mauri Pole, MD;  [facility];  Service: Endoscopy;  Laterality: N/A;   [place]   BIOPSY  06/10/2020   Procedure: BIOPSY;  Surgeon: Lavena Bullion, DO;  [facility];  Service: Gastroenterology;;  EGD and COLON   BREAST SURGERY     CATARACT EXTRACTION Bilateral 2011   CHOLECYSTECTOMY     COLON RESECTION N/A 06/12/2020   Procedure: LAPAROSCOPIC COLON RESECTION;  Surgeon: [name], MD;  [facility];  Service: General;  Laterality: N/A;   COLONOSCOPY WITH PROPOFOL N/A 06/10/2020   Procedure: COLONOSCOPY WITH PROPOFOL;  Surgeon: Lavena Bullion, DO;  [facility];  Service: Gastroenterology;  Laterality: N/A;   ESOPHAGEAL MANOMETRY N/A 06/21/2018   Procedure: ESOPHAGEAL MANOMETRY (EM);  Surgeon: Lavena Bullion, DO;  [facility];  Service: Gastroenterology;  Laterality: N/A;   ESOPHAGOGASTRODUODENOSCOPY (EGD) WITH PROPOFOL N/A 06/10/2020   Procedure: ESOPHAGOGASTRODUODENOSCOPY (EGD) WITH PROPOFOL;  Surgeon: Lavena Bullion, DO;  [facility];  Service: Gastroenterology;  Laterality: N/A;   GALLBLADDER SURGERY  2015   JOINT REPLACEMENT     RT TOTAL HIP / RT TOTAL KNEE   MASTECTOMY  1998   BILATERAL    [place] IMPEDANCE STUDY  06/21/2018   Procedure: [place] IMPEDANCE STUDY;  Surgeon: Lavena Bullion, DO;  [facility];  Service: Gastroenterology;;   POLYPECTOMY  06/10/2020   Procedure: POLYPECTOMY;  Surgeon: Lavena Bullion, DO;  [facility];  Service: Gastroenterology;;   SKIN CANCER EXCISION  2016   RT SIDE OF NOSE   SUBMUCOSAL TATTOO INJECTION  06/10/2020   Procedure: SUBMUCOSAL TATTOO INJECTION;  Surgeon: Lavena Bullion, DO;  [facility];  Service: Gastroenterology;;   [place]  2010   RIGHT   TOTAL HIP ARTHROPLASTY Left 05/09/2015   Procedure: LEFT TOTAL HIP ARTHROPLASTY ANTERIOR APPROACH;  Surgeon: Gaynelle Arabian, MD;  [facility];  Service: Orthopedics;  Laterality: Left;   TOTAL KNEE ARTHROPLASTY  2001   TUMOR REMOVAL  2012   ABDOMINAL - NON CANCEROUS    FAMILY HISTORY: Family History  Problem Relation Age of Onset   Other Mother        complications from flu   Other Father  unsure of cause   Colon cancer Neg Hx     SOCIAL HISTORY: Social History   Socioeconomic History   Marital status: Widowed    Spouse name: Not on file   Number of children: 3   Years of education: 16 years   Highest education level: Not on file  Occupational History   Occupation: Retired  Tobacco Use   Smoking status: Former    Packs/day: 0.50    Years: 20.00    Pack years: 10.00    Types: Cigarettes    Quit date: 02/03/1976    Years since quitting: 45.2   Smokeless tobacco: Never  Vaping Use   Vaping Use: Never used  Substance and Sexual Activity   Alcohol use: No   Drug use: No   Sexual activity: Not Currently    Birth control/protection: Post-menopausal  Other Topics Concern   Not on file  Social History Narrative   Lives at home with husband.   Right-handed.   No caffeine use.   Social Determinants of Health   Financial Resource Strain: Not on file  Food Insecurity: Not on file  Transportation Needs: Not on file  Physical Activity: Not on file  Stress: Not on file  Social Connections: Not on file  Intimate Partner Violence: Not on file    Total time spent reviewing the chart, obtaining history, examined  patient, ordering tests, documentation, consultations and family, care coordination was 50 minutes    Marcial Pacas, M.D. Ph.D.  [facility] [address], [place], [place] [address] Ph: [phone] Fax: [phone]  CC:  Rosalin Hawking, MD [place],  [place] [phone]  [place], Gay Filler, MD

## 2021-05-05 NOTE — Telephone Encounter (Signed)
Medicare/tricare order sent to [facility], [facility] they will reach out to the patient to schedule.  °

## 2021-05-06 NOTE — Telephone Encounter (Signed)
Spoke to daughter  Relayed message from MD

## 2021-05-07 ENCOUNTER — Ambulatory Visit: Payer: Medicare Other | Admitting: Registered Nurse

## 2021-05-08 ENCOUNTER — Telehealth: Payer: Self-pay | Admitting: Registered Nurse

## 2021-05-08 ENCOUNTER — Telehealth: Payer: Self-pay

## 2021-05-08 NOTE — Telephone Encounter (Signed)
Ms Nachtigal needs a refill on medication left message on Nurse Line.

## 2021-05-08 NOTE — Telephone Encounter (Signed)
Return Crystal Lee call , placed a call to [facility]. Spoke with the pharmacist, they have her Hydrocodone prescription. They were instructed to fill her prescription. Placed a call to Crystal Lee, reviewed the above, she verbalizes understanding.

## 2021-05-08 NOTE — Telephone Encounter (Signed)
Patient called stating she ran out of meds and need a refill.

## 2021-05-12 DIAGNOSIS — C189 Malignant neoplasm of colon, unspecified: Secondary | ICD-10-CM | POA: Diagnosis not present

## 2021-05-12 DIAGNOSIS — Z853 Personal history of malignant neoplasm of breast: Secondary | ICD-10-CM | POA: Diagnosis not present

## 2021-05-12 DIAGNOSIS — Z87891 Personal history of nicotine dependence: Secondary | ICD-10-CM | POA: Diagnosis not present

## 2021-05-12 DIAGNOSIS — R269 Unspecified abnormalities of gait and mobility: Secondary | ICD-10-CM | POA: Diagnosis not present

## 2021-05-12 DIAGNOSIS — E785 Hyperlipidemia, unspecified: Secondary | ICD-10-CM | POA: Diagnosis not present

## 2021-05-12 DIAGNOSIS — Z9181 History of falling: Secondary | ICD-10-CM | POA: Diagnosis not present

## 2021-05-12 DIAGNOSIS — G629 Polyneuropathy, unspecified: Secondary | ICD-10-CM | POA: Diagnosis not present

## 2021-05-12 DIAGNOSIS — K219 Gastro-esophageal reflux disease without esophagitis: Secondary | ICD-10-CM | POA: Diagnosis not present

## 2021-05-12 DIAGNOSIS — M818 Other osteoporosis without current pathological fracture: Secondary | ICD-10-CM | POA: Diagnosis not present

## 2021-05-12 DIAGNOSIS — G4730 Sleep apnea, unspecified: Secondary | ICD-10-CM | POA: Diagnosis not present

## 2021-05-12 DIAGNOSIS — Z9013 Acquired absence of bilateral breasts and nipples: Secondary | ICD-10-CM | POA: Diagnosis not present

## 2021-05-12 DIAGNOSIS — F0390 Unspecified dementia without behavioral disturbance: Secondary | ICD-10-CM | POA: Diagnosis not present

## 2021-05-12 DIAGNOSIS — M1990 Unspecified osteoarthritis, unspecified site: Secondary | ICD-10-CM | POA: Diagnosis not present

## 2021-05-12 DIAGNOSIS — T386X5D Adverse effect of antigonadotrophins, antiestrogens, antiandrogens, not elsewhere classified, subsequent encounter: Secondary | ICD-10-CM | POA: Diagnosis not present

## 2021-05-12 DIAGNOSIS — I10 Essential (primary) hypertension: Secondary | ICD-10-CM | POA: Diagnosis not present

## 2021-05-12 DIAGNOSIS — Z7982 Long term (current) use of aspirin: Secondary | ICD-10-CM | POA: Diagnosis not present

## 2021-05-13 ENCOUNTER — Telehealth: Payer: Self-pay | Admitting: Neurology

## 2021-05-13 NOTE — Telephone Encounter (Signed)
Home health referral accepted by [facility] on 12/12. Per Crystal Lee, patient will be seen within 48 hours.

## 2021-05-14 ENCOUNTER — Telehealth: Payer: Self-pay | Admitting: *Deleted

## 2021-05-14 ENCOUNTER — Other Ambulatory Visit: Payer: Self-pay

## 2021-05-14 ENCOUNTER — Encounter: Payer: Medicare Other | Attending: Physical Medicine & Rehabilitation | Admitting: Registered Nurse

## 2021-05-14 ENCOUNTER — Encounter: Payer: Self-pay | Admitting: Registered Nurse

## 2021-05-14 VITALS — BP 157/77 | HR 75 | Ht 66.0 in | Wt 206.0 lb

## 2021-05-14 DIAGNOSIS — S32000S Wedge compression fracture of unspecified lumbar vertebra, sequela: Secondary | ICD-10-CM

## 2021-05-14 DIAGNOSIS — M2550 Pain in unspecified joint: Secondary | ICD-10-CM

## 2021-05-14 DIAGNOSIS — M961 Postlaminectomy syndrome, not elsewhere classified: Secondary | ICD-10-CM

## 2021-05-14 DIAGNOSIS — Z79899 Other long term (current) drug therapy: Secondary | ICD-10-CM

## 2021-05-14 DIAGNOSIS — G894 Chronic pain syndrome: Secondary | ICD-10-CM | POA: Diagnosis not present

## 2021-05-14 DIAGNOSIS — I10 Essential (primary) hypertension: Secondary | ICD-10-CM | POA: Diagnosis not present

## 2021-05-14 DIAGNOSIS — M818 Other osteoporosis without current pathological fracture: Secondary | ICD-10-CM | POA: Diagnosis not present

## 2021-05-14 DIAGNOSIS — R202 Paresthesia of skin: Secondary | ICD-10-CM | POA: Diagnosis not present

## 2021-05-14 DIAGNOSIS — M7062 Trochanteric bursitis, left hip: Secondary | ICD-10-CM

## 2021-05-14 DIAGNOSIS — M1990 Unspecified osteoarthritis, unspecified site: Secondary | ICD-10-CM | POA: Diagnosis not present

## 2021-05-14 DIAGNOSIS — F0390 Unspecified dementia without behavioral disturbance: Secondary | ICD-10-CM | POA: Diagnosis not present

## 2021-05-14 DIAGNOSIS — R269 Unspecified abnormalities of gait and mobility: Secondary | ICD-10-CM | POA: Diagnosis not present

## 2021-05-14 DIAGNOSIS — E785 Hyperlipidemia, unspecified: Secondary | ICD-10-CM | POA: Diagnosis not present

## 2021-05-14 DIAGNOSIS — M7061 Trochanteric bursitis, right hip: Secondary | ICD-10-CM

## 2021-05-14 DIAGNOSIS — Z5181 Encounter for therapeutic drug level monitoring: Secondary | ICD-10-CM

## 2021-05-14 DIAGNOSIS — M170 Bilateral primary osteoarthritis of knee: Secondary | ICD-10-CM | POA: Diagnosis not present

## 2021-05-14 DIAGNOSIS — M62838 Other muscle spasm: Secondary | ICD-10-CM | POA: Diagnosis not present

## 2021-05-14 NOTE — Progress Notes (Signed)
Subjective:    Patient ID: Crystal Lee, female    DOB: 04-Feb-1934, 87 y.o.   MRN: [id number]  HPI: Crystal Lee is a 87 y.o. female who returns for follow up appointment for chronic pain and medication refill. She states her pain is located in her lower back, bilateral lower extremities, bilateral hip pain and bilateral knee pain. Also reports generalized joint pain. She rates her pain 5. Her current exercise regime is walking and performing stretching exercises.   Crystal Lee Morphine equivalent is 40.00 MME.   Oral Swab was Ordered today.    Pain Inventory Average Pain 5 Pain Right Now 5 My pain is constant, sharp, burning, and aching  In the last 24 hours, has pain interfered with the following? General activity 4 Relation with others 3 Enjoyment of life 4 What TIME of day is your pain at its worst? daytime and evening Sleep (in general) Fair  Pain is worse with: walking, bending, standing, and some activites Pain improves with: medication and heat Relief from Meds: 3  Family History  Problem Relation Age of Onset   Other Mother        complications from flu   Other Father        unsure of cause   Colon cancer Neg Hx    Social History   Socioeconomic History   Marital status: Widowed    Spouse name: Not on file   Number of children: 3   Years of education: 16 years   Highest education level: Not on file  Occupational History   Occupation: Retired  Tobacco Use   Smoking status: Former    Packs/day: 0.50    Years: 20.00    Pack years: 10.00    Types: Cigarettes    Quit date: 02/03/1976    Years since quitting: 45.3   Smokeless tobacco: Never  Vaping Use   Vaping Use: Never used  Substance and Sexual Activity   Alcohol use: No   Drug use: No   Sexual activity: Not Currently    Birth control/protection: Post-menopausal  Other Topics Concern   Not on file  Social History Narrative   Lives at home with husband.   Right-handed.   No caffeine use.    Social Determinants of Health   Financial Resource Strain: Not on file  Food Insecurity: Not on file  Transportation Needs: Not on file  Physical Activity: Not on file  Stress: Not on file  Social Connections: Not on file   Past Surgical History:  Procedure Laterality Date   93 HOUR [place] STUDY N/A 06/21/2018   Procedure: 24 HOUR [place];  Surgeon: Lavena Bullion, DO;  [facility];  Service: Gastroenterology;  Laterality: N/A;  with impedance   ABDOMINAL HYSTERECTOMY  2010   ANAL RECTAL MANOMETRY N/A 09/19/2019   Procedure: ANO RECTAL MANOMETRY;  Surgeon: Mauri Pole, MD;  [facility];  Service: Endoscopy;  Laterality: N/A;   [place]   BIOPSY  06/10/2020   Procedure: BIOPSY;  Surgeon: Lavena Bullion, DO;  [facility];  Service: Gastroenterology;;  EGD and COLON   BREAST SURGERY     CATARACT EXTRACTION Bilateral 2011   CHOLECYSTECTOMY     COLON RESECTION N/A 06/12/2020   Procedure: LAPAROSCOPIC COLON RESECTION;  Surgeon: [name], MD;  [facility];  Service: General;  Laterality: N/A;   COLONOSCOPY WITH PROPOFOL N/A 06/10/2020   Procedure: COLONOSCOPY WITH PROPOFOL;  Surgeon: Lavena Bullion, DO;  [facility];  Service: Gastroenterology;  Laterality: N/A;   ESOPHAGEAL MANOMETRY N/A 06/21/2018   Procedure: ESOPHAGEAL MANOMETRY (EM);  Surgeon: Lavena Bullion, DO;  [facility];  Service: Gastroenterology;  Laterality: N/A;   ESOPHAGOGASTRODUODENOSCOPY (EGD) WITH PROPOFOL N/A 06/10/2020   Procedure: ESOPHAGOGASTRODUODENOSCOPY (EGD) WITH PROPOFOL;  Surgeon: Lavena Bullion, DO;  [facility];  Service: Gastroenterology;  Laterality: N/A;   GALLBLADDER SURGERY  2015   JOINT REPLACEMENT     RT TOTAL HIP / RT TOTAL KNEE   MASTECTOMY  1998   BILATERAL    [place] IMPEDANCE STUDY  06/21/2018   Procedure: [place] IMPEDANCE STUDY;  Surgeon: Lavena Bullion, DO;  [facility];  Service: Gastroenterology;;   POLYPECTOMY  06/10/2020   Procedure: POLYPECTOMY;  Surgeon: Lavena Bullion, DO;  [facility];  Service: Gastroenterology;;   SKIN CANCER EXCISION  2016   RT SIDE OF NOSE   SUBMUCOSAL TATTOO INJECTION  06/10/2020   Procedure: SUBMUCOSAL TATTOO INJECTION;  Surgeon: Lavena Bullion, DO;  [facility];  Service: Gastroenterology;;   [place]  2010   RIGHT   TOTAL HIP ARTHROPLASTY Left 05/09/2015   Procedure: LEFT TOTAL HIP ARTHROPLASTY ANTERIOR APPROACH;  Surgeon: Gaynelle Arabian, MD;  [facility];  Service: Orthopedics;  Laterality: Left;   TOTAL KNEE ARTHROPLASTY  2001   TUMOR REMOVAL  2012   ABDOMINAL - NON CANCEROUS   Past Surgical History:  Procedure Laterality Date   52 HOUR [place] STUDY N/A 06/21/2018   Procedure: 24 HOUR [place] STUDY;  Surgeon: Lavena Bullion, DO;  [facility];  Service: Gastroenterology;  Laterality: N/A;  with impedance   ABDOMINAL HYSTERECTOMY  2010   ANAL RECTAL MANOMETRY N/A 09/19/2019   Procedure: ANO RECTAL MANOMETRY;  Surgeon: Mauri Pole, MD;  [facility];  Service: Endoscopy;  Laterality: N/A;   [place]   BIOPSY  06/10/2020   Procedure: BIOPSY;  Surgeon: Lavena Bullion, DO;  [facility];  Service: Gastroenterology;;  EGD and COLON   BREAST SURGERY     CATARACT EXTRACTION Bilateral 2011   CHOLECYSTECTOMY     COLON RESECTION N/A 06/12/2020   Procedure: LAPAROSCOPIC COLON RESECTION;  Surgeon: [name], MD;  [facility];  Service: General;  Laterality: N/A;   COLONOSCOPY WITH PROPOFOL N/A 06/10/2020   Procedure: COLONOSCOPY WITH PROPOFOL;  Surgeon: Lavena Bullion, DO;  [facility];  Service: Gastroenterology;  Laterality: N/A;   ESOPHAGEAL MANOMETRY N/A 06/21/2018   Procedure: ESOPHAGEAL MANOMETRY (EM);  Surgeon: Lavena Bullion, DO;  [facility];  Service: Gastroenterology;  Laterality: N/A;   ESOPHAGOGASTRODUODENOSCOPY (EGD) WITH PROPOFOL N/A 06/10/2020   Procedure: ESOPHAGOGASTRODUODENOSCOPY (EGD) WITH PROPOFOL;  Surgeon: Lavena Bullion, DO;  [facility];  Service: Gastroenterology;  Laterality: N/A;   GALLBLADDER SURGERY  2015   JOINT REPLACEMENT     RT TOTAL HIP / RT TOTAL KNEE   MASTECTOMY  1998   BILATERAL    [place] IMPEDANCE STUDY  06/21/2018   Procedure: [place] IMPEDANCE STUDY;  Surgeon: Lavena Bullion, DO;  [facility];  Service: Gastroenterology;;   POLYPECTOMY  06/10/2020   Procedure: POLYPECTOMY;  Surgeon: Lavena Bullion, DO;  [facility];  Service: Gastroenterology;;   SKIN CANCER EXCISION  2016   RT SIDE OF NOSE   SUBMUCOSAL TATTOO  INJECTION  06/10/2020   Procedure: SUBMUCOSAL TATTOO INJECTION;  Surgeon: Lavena Bullion, DO;  [facility];  Service: Gastroenterology;;   TONSILLECTOMY  1953   TOTAL HIP ARTHROPLASTY  2010   RIGHT   TOTAL HIP ARTHROPLASTY Left 05/09/2015   Procedure: LEFT TOTAL HIP ARTHROPLASTY ANTERIOR APPROACH;  Surgeon: Gaynelle Arabian, MD;  [facility];  Service: Orthopedics;  Laterality: Left;   TOTAL KNEE ARTHROPLASTY  2001   TUMOR REMOVAL  2012   ABDOMINAL - NON CANCEROUS   Past Medical History:  Diagnosis Date   Arthritis    Cancer ([place])    HX BREAST CANCER/ SKIN CANCER   Colon cancer ([place])    Complication of anesthesia    N/V WITH MORPHINE   Difficulty sleeping    Fractured hip ([place])    LEFT - AUG 2016   GERD (gastroesophageal reflux disease)    Hyperlipidemia    Hypertension    Melanoma ([place])    Neuropathy    Nocturia    Osteopenia    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   PONV (postoperative nausea and vomiting)    PT STATES MORPHINE CAUSED N/V   Rosacea    Sleep apnea    Stage 1 breast cancer, ER+, right ([facility]) 07/04/2017   BP (!) 157/77    Pulse 75    Ht 5\' 6"  (1.676 m)    Wt 206 lb (93.4 kg)     SpO2 95%    BMI 33.25 kg/m   Opioid Risk Score:   Fall Risk Score:  `1  Depression screen PHQ 2/9  Depression screen [facility] 2/9 04/01/2021 02/04/2021 01/06/2021 01/06/2021 12/09/2020 09/22/2020 08/11/2020  Decreased Interest 0 0 0 0 0 0 0  Down, Depressed, Hopeless 0 0 - 0 0 0 0  PHQ - 2 Score 0 0 0 0 0 0 0  Some recent data might be hidden    Review of Systems  Musculoskeletal:  Positive for arthralgias, back pain and gait problem.       Pain both legs, upper & lower body pain  All other systems reviewed and are negative.     Objective:   Physical Exam Vitals and nursing note reviewed.  Constitutional:      Appearance: Normal appearance.  Cardiovascular:     Rate and Rhythm: Normal rate and regular rhythm.     Pulses: Normal pulses.     Heart sounds: Normal heart sounds.  Pulmonary:     Effort: Pulmonary effort is normal.     Breath sounds: Normal breath sounds.  Musculoskeletal:     Cervical back: Normal range of motion and neck supple.     Comments: Normal Muscle Bulk and Muscle Testing Reveals:  Upper Extremities: Full ROM and Muscle Strength 5/5  Lumbar Paraspinal Tenderness: L-3-L-5 Lower Extremities: Full ROM and Muscle Strength 5/5 Arises from Table Slowly using walker for support Narrow Based  Gait     Skin:    General: Skin is warm and dry.  Neurological:     Mental Status: She is alert and oriented to person, place, and time.  Psychiatric:        Mood and Affect: Mood normal.        Behavior: Behavior normal.         Assessment & Plan:  1. Chronic Bilateral Leg pain: Paresthesia: Continue to Monitor. 05/14/2021 2. Paresthesia Doreene Burke Radiculitis: Crystal Lee has weaned herself off the Lyrica due to daytime drowsiness. We will continue to monitor. 05/14/2021  3. Pain of Left Wrist/ : No complaints today. S/P Carpal Tunnel Release on 06/03/2017 by Dr. Ellene Route. Dr. Ellene Route Following. 05/14/2021. 4. Fracture of superior pubic ramus.  Dr. Wynelle Link Following.  Continue to  monitor. 05/14/2021. 5. Bilateral Knee OA: Continue Voltaren Gel.  Continue to monitor. Orthopedist following. 05/14/2021 6. Polyarthralgia: Continue to alternate with heat and ice therapy. Continue current medication regime. Continue to monitor. 05/14/2021. 7. Chronic Pain Syndrome: Refilled::Hydrocodone 10/325 mg one tablet every 6 hours as needed for moderate pain #120. 05/14/2021. 8. Lumbar Compression Fracture L2 and L3:  Crystal Lee refused physical therapy. Continue with rest/ heat therapy. Continue to Monitor 05/14/2021. 9. Muscle Spasm: Continue  Flexeril 5 mg at HS. Continue to monitor. 05/14/2021. 10. Right Shoulder Tendonitis: No complaints today. Continue HEP as Tolerated. Alternate Ice and Heat Therapy. 05/14/2021  11. Greater Trochanter Bursitis: Continue to Monitor. Continue HEP as Tolerated. 05/14/2021   F/U in 1 Month

## 2021-05-14 NOTE — Telephone Encounter (Signed)
Dr.Cirigliano,  Please review. This 87 y.o patient is scheduled for RECALL colon. Please see ED visit for 03/25/21, "stroke like symptoms". Ok to proceed with colon as scheduled? Thank you, Mrytle Bento pv

## 2021-05-14 NOTE — Telephone Encounter (Signed)
Thank you for that information.  Chart reviewed.  I recommend OV to discuss whether or not repeat colonoscopy is necessary.  Will require Neurology clearance if the patient ultimately decides she would like to proceed with surveillance colonoscopy after colon cancer diagnosis/treatment in 05/2020.

## 2021-05-15 NOTE — Telephone Encounter (Signed)
Office visit was made.

## 2021-05-16 ENCOUNTER — Other Ambulatory Visit: Payer: Medicare Other

## 2021-05-18 DIAGNOSIS — M1990 Unspecified osteoarthritis, unspecified site: Secondary | ICD-10-CM | POA: Diagnosis not present

## 2021-05-18 DIAGNOSIS — R269 Unspecified abnormalities of gait and mobility: Secondary | ICD-10-CM | POA: Diagnosis not present

## 2021-05-18 DIAGNOSIS — F0390 Unspecified dementia without behavioral disturbance: Secondary | ICD-10-CM | POA: Diagnosis not present

## 2021-05-18 DIAGNOSIS — E785 Hyperlipidemia, unspecified: Secondary | ICD-10-CM | POA: Diagnosis not present

## 2021-05-18 DIAGNOSIS — I10 Essential (primary) hypertension: Secondary | ICD-10-CM | POA: Diagnosis not present

## 2021-05-18 DIAGNOSIS — M818 Other osteoporosis without current pathological fracture: Secondary | ICD-10-CM | POA: Diagnosis not present

## 2021-05-20 LAB — DRUG TOX MONITOR 1 W/CONF, ORAL FLD
Amphetamines: NEGATIVE ng/mL (ref <10)
Barbiturates: NEGATIVE ng/mL (ref <10)
Benzodiazepines: NEGATIVE ng/mL (ref <0.50)
Buprenorphine: NEGATIVE ng/mL (ref <0.10)
Cocaine: NEGATIVE ng/mL (ref <5.0)
Codeine: NEGATIVE ng/mL (ref <2.5)
Dihydrocodeine: 12.1 ng/mL — ABNORMAL HIGH (ref <2.5)
Fentanyl: NEGATIVE ng/mL (ref <0.10)
Heroin Metabolite: NEGATIVE ng/mL (ref <1.0)
Hydrocodone: 183.1 ng/mL — ABNORMAL HIGH (ref <2.5)
Hydromorphone: NEGATIVE ng/mL (ref <2.5)
MARIJUANA: NEGATIVE ng/mL (ref <2.5)
MDMA: NEGATIVE ng/mL (ref <10)
Meprobamate: NEGATIVE ng/mL (ref <2.5)
Methadone: NEGATIVE ng/mL (ref <5.0)
Morphine: NEGATIVE ng/mL (ref <2.5)
Nicotine Metabolite: NEGATIVE ng/mL (ref <5.0)
Norhydrocodone: 8.9 ng/mL — ABNORMAL HIGH (ref <2.5)
Noroxycodone: NEGATIVE ng/mL (ref <2.5)
Opiates: POSITIVE ng/mL — AB (ref <2.5)
Oxycodone: NEGATIVE ng/mL (ref <2.5)
Oxymorphone: NEGATIVE ng/mL (ref <2.5)
Phencyclidine: NEGATIVE ng/mL (ref <10)
Tapentadol: NEGATIVE ng/mL (ref <5.0)
Tramadol: NEGATIVE ng/mL (ref <5.0)
Zolpidem: NEGATIVE ng/mL (ref <5.0)

## 2021-05-20 LAB — DRUG TOX ALC METAB W/CON, ORAL FLD: Alcohol Metabolite: NEGATIVE ng/mL (ref <25)

## 2021-05-26 ENCOUNTER — Telehealth: Payer: Self-pay | Admitting: *Deleted

## 2021-05-26 ENCOUNTER — Telehealth: Payer: Self-pay | Admitting: Family Medicine

## 2021-05-26 NOTE — Telephone Encounter (Signed)
Left message for patient to call back and schedule Medicare Annual Wellness Visit (AWV) in office.  ° °If not able to come in office, please offer to do virtually or by telephone.  Left office number and my jabber #[phone]. ° °Due for AWVI ° °Please schedule at anytime with Nurse Health Advisor. °  °

## 2021-05-26 NOTE — Telephone Encounter (Signed)
Oral swab drug screen was consistent for prescribed medications.  ?

## 2021-05-27 DIAGNOSIS — M1990 Unspecified osteoarthritis, unspecified site: Secondary | ICD-10-CM | POA: Diagnosis not present

## 2021-05-27 DIAGNOSIS — E785 Hyperlipidemia, unspecified: Secondary | ICD-10-CM | POA: Diagnosis not present

## 2021-05-27 DIAGNOSIS — M818 Other osteoporosis without current pathological fracture: Secondary | ICD-10-CM | POA: Diagnosis not present

## 2021-05-27 DIAGNOSIS — F0390 Unspecified dementia without behavioral disturbance: Secondary | ICD-10-CM | POA: Diagnosis not present

## 2021-05-27 DIAGNOSIS — I10 Essential (primary) hypertension: Secondary | ICD-10-CM | POA: Diagnosis not present

## 2021-05-27 DIAGNOSIS — R269 Unspecified abnormalities of gait and mobility: Secondary | ICD-10-CM | POA: Diagnosis not present

## 2021-05-29 DIAGNOSIS — F0390 Unspecified dementia without behavioral disturbance: Secondary | ICD-10-CM | POA: Diagnosis not present

## 2021-05-29 DIAGNOSIS — M1990 Unspecified osteoarthritis, unspecified site: Secondary | ICD-10-CM | POA: Diagnosis not present

## 2021-05-29 DIAGNOSIS — I10 Essential (primary) hypertension: Secondary | ICD-10-CM | POA: Diagnosis not present

## 2021-05-29 DIAGNOSIS — M818 Other osteoporosis without current pathological fracture: Secondary | ICD-10-CM | POA: Diagnosis not present

## 2021-05-29 DIAGNOSIS — R269 Unspecified abnormalities of gait and mobility: Secondary | ICD-10-CM | POA: Diagnosis not present

## 2021-05-29 DIAGNOSIS — E785 Hyperlipidemia, unspecified: Secondary | ICD-10-CM | POA: Diagnosis not present

## 2021-06-02 ENCOUNTER — Other Ambulatory Visit: Payer: Self-pay

## 2021-06-02 ENCOUNTER — Encounter: Payer: Medicare Other | Attending: Physical Medicine & Rehabilitation | Admitting: Registered Nurse

## 2021-06-02 ENCOUNTER — Encounter: Payer: Self-pay | Admitting: Registered Nurse

## 2021-06-02 VITALS — BP 129/71 | HR 65 | Temp 98.3°F | Ht 66.0 in | Wt 206.0 lb

## 2021-06-02 DIAGNOSIS — M7062 Trochanteric bursitis, left hip: Secondary | ICD-10-CM | POA: Insufficient documentation

## 2021-06-02 DIAGNOSIS — I10 Essential (primary) hypertension: Secondary | ICD-10-CM | POA: Diagnosis not present

## 2021-06-02 DIAGNOSIS — M170 Bilateral primary osteoarthritis of knee: Secondary | ICD-10-CM | POA: Insufficient documentation

## 2021-06-02 DIAGNOSIS — M2550 Pain in unspecified joint: Secondary | ICD-10-CM | POA: Diagnosis not present

## 2021-06-02 DIAGNOSIS — S32000S Wedge compression fracture of unspecified lumbar vertebra, sequela: Secondary | ICD-10-CM | POA: Diagnosis not present

## 2021-06-02 DIAGNOSIS — F0390 Unspecified dementia without behavioral disturbance: Secondary | ICD-10-CM | POA: Diagnosis not present

## 2021-06-02 DIAGNOSIS — R269 Unspecified abnormalities of gait and mobility: Secondary | ICD-10-CM | POA: Diagnosis not present

## 2021-06-02 DIAGNOSIS — M1990 Unspecified osteoarthritis, unspecified site: Secondary | ICD-10-CM | POA: Diagnosis not present

## 2021-06-02 DIAGNOSIS — G894 Chronic pain syndrome: Secondary | ICD-10-CM | POA: Insufficient documentation

## 2021-06-02 DIAGNOSIS — Z79899 Other long term (current) drug therapy: Secondary | ICD-10-CM | POA: Insufficient documentation

## 2021-06-02 DIAGNOSIS — M7061 Trochanteric bursitis, right hip: Secondary | ICD-10-CM | POA: Insufficient documentation

## 2021-06-02 DIAGNOSIS — Z5181 Encounter for therapeutic drug level monitoring: Secondary | ICD-10-CM | POA: Diagnosis not present

## 2021-06-02 DIAGNOSIS — M961 Postlaminectomy syndrome, not elsewhere classified: Secondary | ICD-10-CM | POA: Insufficient documentation

## 2021-06-02 DIAGNOSIS — R202 Paresthesia of skin: Secondary | ICD-10-CM | POA: Insufficient documentation

## 2021-06-02 DIAGNOSIS — M62838 Other muscle spasm: Secondary | ICD-10-CM | POA: Diagnosis not present

## 2021-06-02 DIAGNOSIS — E785 Hyperlipidemia, unspecified: Secondary | ICD-10-CM | POA: Diagnosis not present

## 2021-06-02 DIAGNOSIS — M818 Other osteoporosis without current pathological fracture: Secondary | ICD-10-CM | POA: Diagnosis not present

## 2021-06-02 MED ORDER — CYCLOBENZAPRINE HCL 5 MG PO TABS: 5.0000 mg | ORAL_TABLET | Freq: Every evening | ORAL | 1 refills | Status: DC | PRN

## 2021-06-02 MED ORDER — HYDROCODONE-ACETAMINOPHEN 10-325 MG PO TABS: 1.0000 | ORAL_TABLET | Freq: Four times a day (QID) | ORAL | 0 refills | Status: DC | PRN

## 2021-06-02 NOTE — Progress Notes (Signed)
Subjective:    Patient ID: Crystal Lee, female    DOB: 16-Jul-1933, 88 y.o.   MRN: [id number]  HPI: Crystal Lee is a 88 y.o. female who returns for follow up appointment for chronic pain and medication refill. She states her pain is located in her lower back,  bilateral hips, and bilateral lower extremities. Also reports muscle spasms in her bilateral lower extremities and generalized joint pain. She rates her pain 5. Her current exercise regime is walking and performing stretching exercises.  Crystal Lee Morphine equivalent is 40.00 MME.   Oral Swab was Performed on 05/14/2021, it was consistent.      Pain Inventory Average Pain 5 Pain Right Now 5 My pain is constant, sharp, burning, tingling, and aching  In the last 24 hours, has pain interfered with the following? General activity 5 Relation with others 3 Enjoyment of life 5 What TIME of day is your pain at its worst? evening and night Sleep (in general) Poor  Pain is worse with: walking, bending, standing, and some activites Pain improves with: rest, medication, and heat Relief from Meds: 3  Family History  Problem Relation Age of Onset   Other Mother        complications from flu   Other Father        unsure of cause   Colon cancer Neg Hx    Social History   Socioeconomic History   Marital status: Widowed    Spouse name: Not on file   Number of children: 3   Years of education: 16 years   Highest education level: Not on file  Occupational History   Occupation: Retired  Tobacco Use   Smoking status: Former    Packs/day: 0.50    Years: 20.00    Pack years: 10.00    Types: Cigarettes    Quit date: 02/03/1976    Years since quitting: 45.3   Smokeless tobacco: Never  Vaping Use   Vaping Use: Never used  Substance and Sexual Activity   Alcohol use: No   Drug use: No   Sexual activity: Not Currently    Birth control/protection: Post-menopausal  Other Topics Concern   Not on file  Social History Narrative    Lives at home with husband.   Right-handed.   No caffeine use.   Social Determinants of Health   Financial Resource Strain: Not on file  Food Insecurity: Not on file  Transportation Needs: Not on file  Physical Activity: Not on file  Stress: Not on file  Social Connections: Not on file   Past Surgical History:  Procedure Laterality Date   35 HOUR [place] STUDY N/A 06/21/2018   Procedure: 24 HOUR [place];  Surgeon: Lavena Bullion, DO;  [facility];  Service: Gastroenterology;  Laterality: N/A;  with impedance   ABDOMINAL HYSTERECTOMY  2010   ANAL RECTAL MANOMETRY N/A 09/19/2019   Procedure: ANO RECTAL MANOMETRY;  Surgeon: Mauri Pole, MD;  [facility];  Service: Endoscopy;  Laterality: N/A;   [place]   BIOPSY  06/10/2020   Procedure: BIOPSY;  Surgeon: Lavena Bullion, DO;  [facility];  Service: Gastroenterology;;  EGD and COLON   BREAST SURGERY     CATARACT EXTRACTION Bilateral 2011   CHOLECYSTECTOMY     COLON RESECTION N/A 06/12/2020   Procedure: LAPAROSCOPIC COLON RESECTION;  Surgeon: [name], MD;  [facility];  Service: General;  Laterality: N/A;  COLONOSCOPY WITH PROPOFOL N/A 06/10/2020   Procedure: COLONOSCOPY WITH PROPOFOL;  Surgeon: Lavena Bullion, DO;  [facility];  Service: Gastroenterology;  Laterality: N/A;   ESOPHAGEAL MANOMETRY N/A 06/21/2018   Procedure: ESOPHAGEAL MANOMETRY (EM);  Surgeon: Lavena Bullion, DO;  [facility];  Service: Gastroenterology;  Laterality: N/A;   ESOPHAGOGASTRODUODENOSCOPY (EGD) WITH PROPOFOL N/A 06/10/2020   Procedure: ESOPHAGOGASTRODUODENOSCOPY (EGD) WITH PROPOFOL;  Surgeon: Lavena Bullion, DO;  [facility];  Service: Gastroenterology;  Laterality: N/A;   GALLBLADDER SURGERY  2015   JOINT REPLACEMENT     RT TOTAL HIP / RT TOTAL KNEE   MASTECTOMY  1998   BILATERAL    [place] IMPEDANCE STUDY  06/21/2018    Procedure: [place] IMPEDANCE STUDY;  Surgeon: Lavena Bullion, DO;  [facility];  Service: Gastroenterology;;   POLYPECTOMY  06/10/2020   Procedure: POLYPECTOMY;  Surgeon: Lavena Bullion, DO;  [facility];  Service: Gastroenterology;;   SKIN CANCER EXCISION  2016   RT SIDE OF NOSE   SUBMUCOSAL TATTOO INJECTION  06/10/2020   Procedure: SUBMUCOSAL TATTOO INJECTION;  Surgeon: Lavena Bullion, DO;  [facility];  Service: Gastroenterology;;   [place]  2010   RIGHT   TOTAL HIP ARTHROPLASTY Left 05/09/2015   Procedure: LEFT TOTAL HIP ARTHROPLASTY ANTERIOR APPROACH;  Surgeon: Gaynelle Arabian, MD;  [facility];  Service: Orthopedics;  Laterality: Left;   TOTAL KNEE ARTHROPLASTY  2001   TUMOR REMOVAL  2012   ABDOMINAL - NON CANCEROUS   Past Surgical History:  Procedure Laterality Date   80 HOUR [place] STUDY N/A 06/21/2018   Procedure: 24 HOUR [place] STUDY;  Surgeon: Lavena Bullion, DO;  [facility];  Service: Gastroenterology;  Laterality: N/A;  with impedance   ABDOMINAL HYSTERECTOMY  2010   ANAL RECTAL MANOMETRY N/A 09/19/2019   Procedure: ANO RECTAL MANOMETRY;  Surgeon: Mauri Pole, MD;  [facility];  Service: Endoscopy;  Laterality: N/A;   [place]   BIOPSY  06/10/2020   Procedure: BIOPSY;  Surgeon: Lavena Bullion, DO;  [facility];  Service: Gastroenterology;;  EGD and COLON   BREAST SURGERY     CATARACT EXTRACTION Bilateral 2011   CHOLECYSTECTOMY     COLON RESECTION N/A 06/12/2020   Procedure: LAPAROSCOPIC COLON RESECTION;  Surgeon: [name], MD;  [facility];  Service: General;  Laterality: N/A;   COLONOSCOPY WITH PROPOFOL N/A 06/10/2020   Procedure: COLONOSCOPY WITH PROPOFOL;  Surgeon: Lavena Bullion, DO;  [facility];  Service: Gastroenterology;  Laterality: N/A;   ESOPHAGEAL MANOMETRY N/A 06/21/2018   Procedure:  ESOPHAGEAL MANOMETRY (EM);  Surgeon: Lavena Bullion, DO;  [facility];  Service: Gastroenterology;  Laterality: N/A;   ESOPHAGOGASTRODUODENOSCOPY (EGD) WITH PROPOFOL N/A 06/10/2020   Procedure: ESOPHAGOGASTRODUODENOSCOPY (EGD) WITH PROPOFOL;  Surgeon: Lavena Bullion, DO;  [facility];  Service: Gastroenterology;  Laterality: N/A;   GALLBLADDER SURGERY  2015   JOINT REPLACEMENT     RT TOTAL HIP / RT TOTAL KNEE   MASTECTOMY  1998   BILATERAL    [place] IMPEDANCE STUDY  06/21/2018   Procedure: [place] IMPEDANCE STUDY;  Surgeon: Lavena Bullion, DO;  [facility];  Service: Gastroenterology;;   POLYPECTOMY  06/10/2020   Procedure: POLYPECTOMY;  Surgeon: Lavena Bullion, DO;  [facility];  Service: Gastroenterology;;   SKIN CANCER EXCISION  2016   RT SIDE OF NOSE   SUBMUCOSAL TATTOO INJECTION  06/10/2020   Procedure: SUBMUCOSAL TATTOO INJECTION;  Surgeon: Lavena Bullion, DO;  [facility];  Service: Gastroenterology;;   TONSILLECTOMY  1953   TOTAL HIP ARTHROPLASTY  2010   RIGHT   TOTAL HIP ARTHROPLASTY Left 05/09/2015   Procedure: LEFT TOTAL HIP ARTHROPLASTY ANTERIOR APPROACH;  Surgeon: Gaynelle Arabian, MD;  [facility];  Service: Orthopedics;  Laterality: Left;   TOTAL KNEE ARTHROPLASTY  2001   TUMOR REMOVAL  2012   ABDOMINAL - NON CANCEROUS   Past Medical History:  Diagnosis Date   Arthritis    Cancer ([place])    HX BREAST CANCER/ SKIN CANCER   Colon cancer ([place])    Complication of anesthesia    N/V WITH MORPHINE   Difficulty sleeping    Fractured hip ([place])    LEFT - AUG 2016   GERD (gastroesophageal reflux disease)    Hyperlipidemia    Hypertension    Melanoma ([place])    Neuropathy    Nocturia    Osteopenia    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  Opioid Risk Score:   Fall Risk Score:  `1  Depression screen PHQ 2/9  Depression screen [facility] 2/9 05/14/2021 04/01/2021 02/04/2021 01/06/2021 01/06/2021 12/09/2020 09/22/2020  Decreased Interest 0 0 0 0 0 0 0  Down, Depressed, Hopeless 0 0 0 - 0 0 0  PHQ - 2 Score 0 0 0 0 0 0 0  Some recent data might be hidden    Review of Systems  Musculoskeletal:  Positive for back pain and gait problem.  All other systems reviewed and are negative.     Objective:   Physical Exam Vitals and nursing note reviewed.  Constitutional:      Appearance: Normal appearance.  Cardiovascular:     Rate and Rhythm: Normal rate and regular rhythm.     Pulses: Normal pulses.     Heart sounds: Normal heart sounds.  Pulmonary:     Effort: Pulmonary effort is normal.     Breath sounds: Normal breath sounds.  Musculoskeletal:     Cervical back: Normal range of motion and neck supple.     Comments: Normal Muscle Bulk and Muscle Testing Reveals:  Upper Extremities: Full ROM and Muscle Strength  5/5 Thoracic Paraspinal Tenderness: T-7-T-9 Lumbar Paraspinal Tenderness: L-3-L-5 Bilateral Greater Trochanter Tenderness Lower Extremities: Full ROM and Muscle Strength 5/5 Arises from Table slowly using walker for support Antalgic  Gait     Skin:    General: Skin is warm and dry.  Neurological:     Mental Status: She is alert and oriented to person, place, and time.  Psychiatric:        Mood and Affect: Mood normal.        Behavior: Behavior normal.         Assessment & Plan:  1. Chronic Bilateral Leg pain: Paresthesia: Continue to Monitor. 06/02/2021 2. Paresthesia Doreene Burke Radiculitis: Crystal Lee has weaned herself off the Lyrica due to daytime drowsiness. We will continue to monitor. 06/02/2021 3. Pain of Left Wrist/ : No complaints today. S/P Carpal Tunnel Release on 06/03/2017 by Dr. Ellene Route. Dr. Ellene Route Following. 06/02/2021. 4. Fracture of  superior pubic ramus.   Dr. Wynelle Link Following.  Continue to monitor. 06/02/2021. 5. Bilateral Knee OA: Continue Voltaren Gel.  Continue to monitor. Orthopedist following. 06/02/2021 6. Polyarthralgia: Continue to alternate with heat and ice therapy. Continue current medication regime. Continue to monitor. 06/02/2021. 7. Chronic Pain Syndrome: Refilled::Hydrocodone 10/325 mg one tablet every 6 hours as needed for moderate pain #120. 06/02/2021. 8. Lumbar Compression Fracture L2 and L3:  Crystal Lee refused physical therapy. Continue with rest/ heat therapy. Continue to Monitor 06/02/2021. 9. Muscle Spasm: Continue  Flexeril 5 mg at HS. Continue to monitor. 06/02/2021. 10. Right Shoulder Tendonitis: No complaints today. Continue HEP as Tolerated. Alternate Ice and Heat Therapy. 06/02/2021  11. Greater Trochanter Bursitis: Continue to Monitor. Continue HEP as Tolerated. 06/02/2021   F/U in 1 Month

## 2021-06-09 DIAGNOSIS — R269 Unspecified abnormalities of gait and mobility: Secondary | ICD-10-CM | POA: Diagnosis not present

## 2021-06-09 DIAGNOSIS — E785 Hyperlipidemia, unspecified: Secondary | ICD-10-CM | POA: Diagnosis not present

## 2021-06-09 DIAGNOSIS — M1990 Unspecified osteoarthritis, unspecified site: Secondary | ICD-10-CM | POA: Diagnosis not present

## 2021-06-09 DIAGNOSIS — F0390 Unspecified dementia without behavioral disturbance: Secondary | ICD-10-CM | POA: Diagnosis not present

## 2021-06-09 DIAGNOSIS — I10 Essential (primary) hypertension: Secondary | ICD-10-CM | POA: Diagnosis not present

## 2021-06-09 DIAGNOSIS — M818 Other osteoporosis without current pathological fracture: Secondary | ICD-10-CM | POA: Diagnosis not present

## 2021-06-11 DIAGNOSIS — Z7982 Long term (current) use of aspirin: Secondary | ICD-10-CM | POA: Diagnosis not present

## 2021-06-11 DIAGNOSIS — R269 Unspecified abnormalities of gait and mobility: Secondary | ICD-10-CM | POA: Diagnosis not present

## 2021-06-11 DIAGNOSIS — Z853 Personal history of malignant neoplasm of breast: Secondary | ICD-10-CM | POA: Diagnosis not present

## 2021-06-11 DIAGNOSIS — G629 Polyneuropathy, unspecified: Secondary | ICD-10-CM | POA: Diagnosis not present

## 2021-06-11 DIAGNOSIS — I10 Essential (primary) hypertension: Secondary | ICD-10-CM | POA: Diagnosis not present

## 2021-06-11 DIAGNOSIS — Z9013 Acquired absence of bilateral breasts and nipples: Secondary | ICD-10-CM | POA: Diagnosis not present

## 2021-06-11 DIAGNOSIS — M1990 Unspecified osteoarthritis, unspecified site: Secondary | ICD-10-CM | POA: Diagnosis not present

## 2021-06-11 DIAGNOSIS — E785 Hyperlipidemia, unspecified: Secondary | ICD-10-CM | POA: Diagnosis not present

## 2021-06-11 DIAGNOSIS — T386X5D Adverse effect of antigonadotrophins, antiestrogens, antiandrogens, not elsewhere classified, subsequent encounter: Secondary | ICD-10-CM | POA: Diagnosis not present

## 2021-06-11 DIAGNOSIS — M818 Other osteoporosis without current pathological fracture: Secondary | ICD-10-CM | POA: Diagnosis not present

## 2021-06-11 DIAGNOSIS — Z9181 History of falling: Secondary | ICD-10-CM | POA: Diagnosis not present

## 2021-06-11 DIAGNOSIS — G4730 Sleep apnea, unspecified: Secondary | ICD-10-CM | POA: Diagnosis not present

## 2021-06-11 DIAGNOSIS — F0390 Unspecified dementia without behavioral disturbance: Secondary | ICD-10-CM | POA: Diagnosis not present

## 2021-06-11 DIAGNOSIS — K219 Gastro-esophageal reflux disease without esophagitis: Secondary | ICD-10-CM | POA: Diagnosis not present

## 2021-06-11 DIAGNOSIS — C189 Malignant neoplasm of colon, unspecified: Secondary | ICD-10-CM | POA: Diagnosis not present

## 2021-06-11 DIAGNOSIS — Z87891 Personal history of nicotine dependence: Secondary | ICD-10-CM | POA: Diagnosis not present

## 2021-06-12 ENCOUNTER — Ambulatory Visit: Payer: Medicare Other

## 2021-06-16 ENCOUNTER — Encounter: Payer: Self-pay | Admitting: Gastroenterology

## 2021-06-16 ENCOUNTER — Other Ambulatory Visit: Payer: Self-pay

## 2021-06-16 ENCOUNTER — Ambulatory Visit (INDEPENDENT_AMBULATORY_CARE_PROVIDER_SITE_OTHER): Payer: Medicare Other | Admitting: Gastroenterology

## 2021-06-16 VITALS — BP 136/72 | HR 85 | Ht 66.0 in | Wt 206.1 lb

## 2021-06-16 DIAGNOSIS — K649 Unspecified hemorrhoids: Secondary | ICD-10-CM | POA: Diagnosis not present

## 2021-06-16 DIAGNOSIS — M6289 Other specified disorders of muscle: Secondary | ICD-10-CM

## 2021-06-16 DIAGNOSIS — C189 Malignant neoplasm of colon, unspecified: Secondary | ICD-10-CM

## 2021-06-16 DIAGNOSIS — K5900 Constipation, unspecified: Secondary | ICD-10-CM | POA: Diagnosis not present

## 2021-06-16 NOTE — Patient Instructions (Addendum)
If you are age 86 or older, your body mass index should be between 23-30. Your There is no height or weight on file to calculate BMI. If this is out of the aforementioned range listed, please consider follow up with your Primary Care Provider.  If you are age 2 or younger, your body mass index should be between 19-25. Your There is no height or weight on file to calculate BMI. If this is out of the aformentioned range listed, please consider follow up with your Primary Care Provider.   __________________________________________________________  The [facility] providers would like to encourage you to use [facility] to communicate with providers for non-urgent requests or questions.  Due to long hold times on the telephone, sending your provider a message by [facility] may be a faster and more efficient way to get a response.  Please allow 48 business hours for a response.  Please remember that this is for non-urgent requests.    Due to recent changes in healthcare laws, you may see the results of your imaging and laboratory studies on MyChart before your provider has had a chance to review them.  We understand that in some cases there may be results that are confusing or concerning to you. Not all laboratory results come back in the same time frame and the provider may be waiting for multiple results in order to interpret others.  Please give [place] 48 hours in order for your provider to thoroughly review all the results before contacting the office for clarification of your results.   You have been scheduled for a colonoscopy. Please follow written instructions given to you at your visit today.  Please pick up your prep supplies at the pharmacy within the next 1-3 days. If you use inhalers (even only as needed), please bring them with you on the day of your procedure.   We have given you samples of the following medication to take: Clenpiq  Thank you for choosing me and [facility].  Vito  Cirigliano, D.O.

## 2021-06-16 NOTE — Progress Notes (Signed)
Chief Complaint:    Constipation, colon cancer surveillance  GI History: 88 year old female follows in the GI clinic with the following:   1) Colon cancer: Diagnosed during hospital admission in 05/2020 for hematochezia and acute blood loss anemia. 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  CEA 6.8.  MMR normal.   2) GERD: Index sxs of belching along with NCCP.  Has been present for over 20 years.  Has been evaluated in ER several times for these sxs with negative cardiac w/u. Has trialed pantoprazole, omeprazole, esomeprazole, Zantac all without any improvement in sxs. No dysphagia or odynophagia. Sxs occur 2-3 times/week. Tried bland diet diet with some improvement. Worse with coffee, chocolate, spicy foods, tomato based sauces.  pH study normal in 2020.   3) Constipation: Long standing history of constipation. Had trialed Trulance in 2020.  Otherwise, had been taking Linzess periodically for years.   Exacerbated by postoperative/malignancy pain medications and started on Movantik in 07/2020 with good relief, but insurance no longer would cover.   4) Elevated ALP: Ranges 121-156, but also in the setting of  pelvic fractures. Normal T bili.  5' nucleotidase elevated at 15 and elevated GGT at 123 in 10/2018.  MRCP 10/2018 with hepatic steatosis, stable lymphadenopathy, normal bile ducts.     Endoscopic history: - Colonoscopy (09/2015, [place], [place]): 2 subcentimeter tubular adenomas.  Recommended repeat in 5 years for ongoing surveillance. -EGD (03/2015, [place], [place]): Non-H. pylori gastritis, gastric polyp. - Esophageal Manometry (2020): Normal - pH/impedance (2020): Normal with JD score of 4 and pH <4 was 0.8% (states that she avoided eating during the study due to gag reflex from probe in  place) -Colonoscopy (05/2020): 6 mm sigmoid polyp, mass at splenic flexure/proximal descending colon (biopsy: Adenocarcinoma), sigmoid diverticulosis -EGD (05/2020): Benign fundic gland polyps otherwise normal  HPI:     Patient is a 88 y.o. female presenting to the Gastroenterology Clinic for follow-up.  Last seen by me on 10/09/2020.  Main GI GI issue at that time was constipation, but that had improved with Movantik and prn use of senna, MiraLAX, Colace (she would alternate between these).  Insurance no longer covering her Movantik, so constipation has increased again.  Continues to control with prn meds as above.  More recently with nocturnal belching. Resolves with cold Cola.  Does not want to trial any medications for this.  Was seen in the ED in 03/25/2021 for "strokelike symptoms". Was NOT diagnosed with stroke. Since then, has had follow-up with Neurology and PM&R.  Diagnosed with small vessel disease, evidence of amyloid angiopathy, central nervous system degenerative disorder, likely mild to moderate Alzheimer's disease per review of notes.  She is seeking additional Neurologic opinion.  Due for repeat colonoscopy for ongoing colon cancer surveillance.  CT C/A/P in 01/2021: No e/o malignant recurrence or metastatic disease.  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 Swallow whole.     atorvastatin (LIPITOR) 20 MG tablet Take 1 tablet (20 mg total) by mouth daily. 30 tablet 0   cyclobenzaprine (FLEXERIL) 5 MG tablet Take 1 tablet (5 mg total) by mouth at bedtime as needed for muscle spasms. 30 tablet 1   estradiol (ESTRACE VAGINAL) 0.1 MG/GM vaginal cream Apply 1gm vaginally 1-3x a week as needed for comfort (Patient taking differently: Place 1 g vaginally See admin instructions. 1g vaginally once to three times a week as needed for comfort with UTI) 42.5 g 12   HYDROcodone-acetaminophen (NORCO) 10-325 MG tablet Take 1 tablet by mouth every 6 (six) hours as needed. 120 tablet 0   losartan (COZAAR) 25 MG tablet Take 1 tablet (25 mg total) by mouth daily. 90 tablet 3   Multiple Vitamins-Minerals (MULTIVITAMIN ADULTS) TABS Take 1 tablet by mouth daily.     nitrofurantoin, macrocrystal-monohydrate, (MACROBID) 100 MG capsule Take 1 capsule (100 mg total) by mouth daily. 90 capsule 1   phenazopyridine (PYRIDIUM) 200 MG tablet Take 1 tablet (200 mg total) by mouth 3 (three) times daily as needed for pain. 40 tablet 0   phenazopyridine (PYRIDIUM) 95 MG tablet Take 1 tablet (95 mg total) by mouth 3 (three) times daily as needed for pain. 90 tablet 1   No current facility-administered medications for this visit.    Physical Exam:     BP 136/72    Pulse 85    Ht $R'5\' 6"'Xg$  (1.676 m)    Wt 206 lb 2 oz (93.5 kg)    BMI 33.27 kg/m   GENERAL:  Pleasant female in NAD PSYCH: : Cooperative, normal affect SKIN:  turgor, no lesions seen Musculoskeletal:  Normal muscle tone, normal strength NEURO: Alert and oriented x 3, well conversive   IMPRESSION and PLAN:    1) Colon cancer 88 year old female with stage IIIc colon cancer  diagnosed 05/2020, s/p surgical resection then adjuvant therapy, completed in 11/2020 - Continue follow-up with Dr. Marin Olp - Discussed the role/utility of repeat colonoscopy for short interval surveillance.  Per guidelines, due for repeat colonoscopy now.  She would very much like to proceed with repeat colonoscopy. - Scheduled for colonoscopy - Extended 2-day prep   2) Chronic constipation - Movantik worked well, but unfortunately not being covered by her insurance and longer - Based on clinical history, may also benefit from referral to pelvic floor PT - Continue senna and MiraLAX as needed - Continue adequate hydration   3) Internal hemorrhoids - Exacerbated with exacerbation of constipation - Evaluate grade/severity time of colonoscopy  The indications, risks, and benefits of colonoscopy were explained to the patient in detail. Risks include but are not limited to bleeding, perforation, adverse reaction to medications, and cardiopulmonary compromise. Sequelae include but are not limited to the possibility of surgery, hospitalization, and mortality. The patient verbalized understanding and wished to proceed. All questions answered,  referred to the scheduler and bowel prep ordered. Further recommendations pending results of the exam.        [place] ,DO, FACG 06/16/2021, 11:08 AM

## 2021-06-17 ENCOUNTER — Encounter: Payer: Self-pay | Admitting: Hematology & Oncology

## 2021-06-17 ENCOUNTER — Telehealth: Payer: Self-pay | Admitting: Gastroenterology

## 2021-06-17 ENCOUNTER — Telehealth: Payer: Self-pay

## 2021-06-17 DIAGNOSIS — R2990 Unspecified symptoms and signs involving the nervous system: Secondary | ICD-10-CM

## 2021-06-17 NOTE — Telephone Encounter (Signed)
-----   Message from [place], DO sent at 06/17/2021  7:54 AM EST ----- I spoke with one of the Neurologist over at [facility] about this patient, and they would be happy to see her in their clinic. Can you please place a referral to [facility] and update the patient? Thanks!

## 2021-06-17 NOTE — Telephone Encounter (Signed)
Spoke with the patient and she was concerned that her instructions stated Golytely and it is actually Clenpiq. Patient was okay at the end of the conversation, verbalized understanding that she was only to have Clenpiq not golytely

## 2021-06-17 NOTE — Telephone Encounter (Signed)
Inbound call from patient states she need to discuss prep for upcoming procedure 1/19

## 2021-06-17 NOTE — Telephone Encounter (Signed)
Informed the patient that we have referred her to the Neurologist, and someone should be contacting her from that office. Informed her that if she didn't hear from them in 2 weeks, give our office call back. Patient verbalized understanding.

## 2021-06-18 ENCOUNTER — Encounter: Payer: Self-pay | Admitting: Gastroenterology

## 2021-06-18 ENCOUNTER — Ambulatory Visit (AMBULATORY_SURGERY_CENTER): Payer: Medicare Other | Admitting: Gastroenterology

## 2021-06-18 ENCOUNTER — Encounter: Payer: Medicare Other | Admitting: Gastroenterology

## 2021-06-18 VITALS — BP 141/66 | HR 74 | Temp 98.6°F | Resp 17 | Ht 66.0 in | Wt 206.0 lb

## 2021-06-18 DIAGNOSIS — D123 Benign neoplasm of transverse colon: Secondary | ICD-10-CM | POA: Diagnosis not present

## 2021-06-18 DIAGNOSIS — C189 Malignant neoplasm of colon, unspecified: Secondary | ICD-10-CM

## 2021-06-18 DIAGNOSIS — Z85038 Personal history of other malignant neoplasm of large intestine: Secondary | ICD-10-CM | POA: Diagnosis not present

## 2021-06-18 DIAGNOSIS — K5730 Diverticulosis of large intestine without perforation or abscess without bleeding: Secondary | ICD-10-CM | POA: Diagnosis not present

## 2021-06-18 DIAGNOSIS — K640 First degree hemorrhoids: Secondary | ICD-10-CM

## 2021-06-18 DIAGNOSIS — D122 Benign neoplasm of ascending colon: Secondary | ICD-10-CM

## 2021-06-18 MED ORDER — SODIUM CHLORIDE 0.9 % IV SOLN: 500.0000 mL | Freq: Once | INTRAVENOUS | Status: DC

## 2021-06-18 NOTE — Patient Instructions (Signed)
Handouts on polyps, hemorrhoids, and diverticulosis given to you today  Await pathology results   YOU HAD AN ENDOSCOPIC PROCEDURE TODAY AT [facility]:   Refer to the procedure report that was given to you for any specific questions about what was found during the examination.  If the procedure report does not answer your questions, please call your gastroenterologist to clarify.  If you requested that your care partner not be given the details of your procedure findings, then the procedure report has been included in a sealed envelope for you to review at your convenience later.  YOU SHOULD EXPECT: Some feelings of bloating in the abdomen. Passage of more gas than usual.  Walking can help get rid of the air that was put into your GI tract during the procedure and reduce the bloating. If you had a lower endoscopy (such as a colonoscopy or flexible sigmoidoscopy) you may notice spotting of blood in your stool or on the toilet paper. If you underwent a bowel prep for your procedure, you may not have a normal bowel movement for a few days.  Please Note:  You might notice some irritation and congestion in your nose or some drainage.  This is from the oxygen used during your procedure.  There is no need for concern and it should clear up in a day or so.  SYMPTOMS TO REPORT IMMEDIATELY:  Following lower endoscopy (colonoscopy or flexible sigmoidoscopy):  Excessive amounts of blood in the stool  Significant tenderness or worsening of abdominal pains  Swelling of the abdomen that is new, acute  Fever of 100F or higher  For urgent or emergent issues, a gastroenterologist can be reached at any hour by calling [phone]. Do not use MyChart messaging for urgent concerns.    DIET:  We do recommend a small meal at first, but then you may proceed to your regular diet.  Drink plenty of fluids but you should avoid alcoholic beverages for 24 hours.  ACTIVITY:  You should plan to take it  easy for the rest of today and you should NOT DRIVE or use heavy machinery until tomorrow (because of the sedation medicines used during the test).    FOLLOW UP: Our staff will call the number listed on your records 48-72 hours following your procedure to check on you and address any questions or concerns that you may have regarding the information given to you following your procedure. If we do not reach you, we will leave a message.  We will attempt to reach you two times.  During this call, we will ask if you have developed any symptoms of COVID 19. If you develop any symptoms (ie: fever, flu-like symptoms, shortness of breath, cough etc.) before then, please call [phone].  If you test positive for Covid 19 in the 2 weeks post procedure, please call and report this information to [place].    If any biopsies were taken you will be contacted by phone or by letter within the next 1-3 weeks.  Please call us at [phone] if you have not heard about the biopsies in 3 weeks.    SIGNATURES/CONFIDENTIALITY: You and/or your care partner have signed paperwork which will be entered into your electronic medical record.  These signatures attest to the fact that that the information above on your After Visit Summary has been reviewed and is understood.  Full responsibility of the confidentiality of this discharge information lies with you and/or your care-partner.

## 2021-06-18 NOTE — Progress Notes (Signed)
Pt's states no medical or surgical changes since previsit or office visit. 

## 2021-06-18 NOTE — Op Note (Signed)
[place] Patient Name: Crystal Lee Procedure Date: 06/18/2021 10:14 AM MRN: [id number] Endoscopist: Gerrit Heck , MD Age: 88 Referring MD:  Date of Birth: 08-31-1933 Gender: Female Account #: [id number] Procedure:                Colonoscopy Indications:              High risk colon cancer surveillance: Personal                            history of colon cancer diagnosed in 05/2020, now                            s/p resection and adjuvant therapy. Medicines:                Monitored Anesthesia Care Procedure:                Pre-Anesthesia Assessment:                           - Prior to the procedure, a History and Physical                            was performed, and patient medications and                            allergies were reviewed. The patient's tolerance of                            previous anesthesia was also reviewed. The risks                            and benefits of the procedure and the sedation                            options and risks were discussed with the patient.                            All questions were answered, and informed consent                            was obtained. Prior Anticoagulants: The patient has                            taken no previous anticoagulant or antiplatelet                            agents. ASA Grade Assessment: III - A patient with                            severe systemic disease. After reviewing the risks                            and benefits, the patient was deemed in  satisfactory condition to undergo the procedure.                           After obtaining informed consent, the colonoscope                            was passed under direct vision. Throughout the                            procedure, the patient's blood pressure, pulse, and                            oxygen saturations were monitored continuously. The                            CF [id number] #[id number] was  introduced through the anus                            and advanced to the the cecum, identified by                            appendiceal orifice and ileocecal valve. The                            colonoscopy was performed without difficulty. The                            patient tolerated the procedure well. The quality                            of the bowel preparation was good. The ileocecal                            valve, appendiceal orifice, and rectum were                            photographed. Scope In: 10:28:49 AM Scope Out: 10:49:40 AM Scope Withdrawal Time: 0 hours 11 minutes 8 seconds  Total Procedure Duration: 0 hours 20 minutes 51 seconds  Findings:                 The perianal and digital rectal examinations were                            normal.                           Three sessile polyps were found in the proximal                            transverse colon (2) and ascending colon. The                            polyps were 4 to 8 mm in size. These polyps were  removed with a cold snare. Resection and retrieval                            were complete. Estimated blood loss was minimal.                           There was evidence of a prior end-to-side                            colo-colonic anastomosis in the distal transverse                            colon. This was patent and was characterized by                            healthy appearing mucosa. The anastomosis was                            traversed.                           The sigmoid colon was moderately tortuous.                           A few small-mouthed diverticula were found in the                            sigmoid colon.                           Non-bleeding internal hemorrhoids were found during                            retroflexion. The hemorrhoids were small.                           A single small angioectasia without bleeding was                             found in the ascending colon. Complications:            No immediate complications. Estimated Blood Loss:     Estimated blood loss was minimal. Impression:               - Three 4 to 8 mm polyps in the proximal transverse                            colon and in the ascending colon, removed with a                            cold snare. Resected and retrieved.                           - Patent end-to-side colo-colonic anastomosis,  characterized by healthy appearing mucosa.                           - Tortuous colon.                           - Diverticulosis in the sigmoid colon.                           - Non-bleeding internal hemorrhoids.                           - A single non-bleeding colonic angioectasia. Recommendation:           - Patient has a contact number available for                            emergencies. The signs and symptoms of potential                            delayed complications were discussed with the                            patient. Return to normal activities tomorrow.                            Written discharge instructions were provided to the                            patient.                           - Resume previous diet.                           - Continue present medications.                           - Await pathology results.                           - Repeat colonoscopy in 3 years for surveillance. Gerrit Heck, MD 06/18/2021 10:57:27 AM

## 2021-06-18 NOTE — Progress Notes (Signed)
Called to room to assist during endoscopic procedure.  Patient ID and intended procedure confirmed with present staff. Received instructions for my participation in the procedure from the performing physician.  

## 2021-06-18 NOTE — Progress Notes (Signed)
GASTROENTEROLOGY PROCEDURE H&P NOTE   Primary Care Physician: Darreld Mclean, MD    Reason for Procedure:   Colon cancer surveillance  Plan:    Colonoscopy  Patient is appropriate for endoscopic procedure(s) in the ambulatory ([facility]) setting.  The nature of the procedure, as well as the risks, benefits, and alternatives were carefully and thoroughly reviewed with the patient. Ample time for discussion and questions allowed. The patient understood, was satisfied, and agreed to proceed.     HPI: Crystal Lee is a 88 y.o. female who presents for Colonoscopy for colon cancer surveillance/history of colon cancer.  Patient was most recently seen in the Gastroenterology Clinic on 06/16/2021 by me.  No interval change in medical history since that appointment. Please refer to that note for full details regarding GI history and clinical presentation.   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   PONV (postoperative nausea and vomiting)    PT STATES MORPHINE CAUSED N/V   Rosacea    Sleep apnea    Stage 1 breast cancer, ER+, right ([place]) 07/04/2017    Past Surgical History:  Procedure Laterality Date   12 HOUR [place] STUDY N/A 06/21/2018   Procedure: 24 HOUR [place] STUDY;  Surgeon: Lavena Bullion, DO;  Location: [facility];  Service: Gastroenterology;  Laterality: N/A;  with impedance   ABDOMINAL HYSTERECTOMY  2010   ANAL RECTAL MANOMETRY N/A 09/19/2019   Procedure: ANO RECTAL MANOMETRY;  Surgeon: Mauri Pole, MD;  Location: [facility];  Service: Endoscopy;  Laterality: N/A;   [place]   BIOPSY  06/10/2020   Procedure: BIOPSY;  Surgeon: Lavena Bullion, DO;  Location: [facility];  Service: Gastroenterology;;  EGD and COLON   BREAST SURGERY     CATARACT EXTRACTION Bilateral 2011   CHOLECYSTECTOMY     COLON RESECTION N/A 06/12/2020   Procedure: LAPAROSCOPIC COLON RESECTION;  Surgeon: [name], MD;  Location: [facility];  Service: General;  Laterality: N/A;   COLONOSCOPY WITH PROPOFOL N/A 06/10/2020   Procedure: COLONOSCOPY WITH PROPOFOL;  Surgeon: Lavena Bullion, DO;  [facility]: [facility];  Service: Gastroenterology;  Laterality: N/A;   ESOPHAGEAL MANOMETRY N/A 06/21/2018   Procedure: ESOPHAGEAL MANOMETRY (EM);  Surgeon: Lavena Bullion, DO;  [facility];  Service: Gastroenterology;  Laterality: N/A;   ESOPHAGOGASTRODUODENOSCOPY (EGD) WITH PROPOFOL N/A 06/10/2020   Procedure: ESOPHAGOGASTRODUODENOSCOPY (EGD) WITH PROPOFOL;  Surgeon: Lavena Bullion, DO;  [facility];  Service: Gastroenterology;  Laterality: N/A;   GALLBLADDER SURGERY  2015   JOINT REPLACEMENT     RT TOTAL HIP / RT TOTAL KNEE   MASTECTOMY  1998   BILATERAL    [place] IMPEDANCE STUDY  06/21/2018   Procedure: [place] IMPEDANCE STUDY;  Surgeon: Lavena Bullion, DO;  [facility]: [facility];  Service: Gastroenterology;;   POLYPECTOMY  06/10/2020   Procedure: POLYPECTOMY;  Surgeon: Lavena Bullion, DO;  [facility];  Service:  Gastroenterology;;   SKIN CANCER EXCISION  2016   RT SIDE OF NOSE   SUBMUCOSAL TATTOO INJECTION  06/10/2020   Procedure: SUBMUCOSAL TATTOO INJECTION;  Surgeon: Lavena Bullion, DO;  [facility];  Service: Gastroenterology;;   TONSILLECTOMY  1953   TOTAL HIP ARTHROPLASTY  2010   RIGHT   TOTAL HIP ARTHROPLASTY Left 05/09/2015   Procedure: LEFT TOTAL HIP ARTHROPLASTY ANTERIOR APPROACH;  Surgeon: Gaynelle Arabian, MD;  Location: [facility];  Service: Orthopedics;  Laterality: Left;   TOTAL KNEE ARTHROPLASTY  2001    TUMOR REMOVAL  2012   ABDOMINAL - NON CANCEROUS    Prior to Admission medications   Medication Sig Start Date End Date Taking? Authorizing Provider  aspirin EC 81 MG tablet Take 81 mg by mouth daily. Swallow whole.   Yes [provider]  atorvastatin (LIPITOR) 20 MG tablet Take 1 tablet (20 mg total) by mouth daily. 03/28/21  Yes Florencia Reasons, MD  HYDROcodone-acetaminophen [facility]) 10-325 MG tablet Take 1 tablet by mouth every 6 (six) hours as needed. 06/02/21  Yes Bayard Hugger, NP  losartan (COZAAR) 25 MG tablet Take 1 tablet (25 mg total) by mouth daily. 04/01/21  Yes Copland, Gay Filler, MD  nitrofurantoin, macrocrystal-monohydrate, (MACROBID) 100 MG capsule Take 1 capsule (100 mg total) by mouth daily. 05/05/21  Yes Jaquita Folds, MD  cyclobenzaprine (FLEXERIL) 5 MG tablet Take 1 tablet (5 mg total) by mouth at bedtime as needed for muscle spasms. 06/02/21   Bayard Hugger, NP  estradiol (ESTRACE VAGINAL) 0.1 MG/GM vaginal cream Apply 1gm vaginally 1-3x a week as needed for comfort Patient taking differently: Place 1 g vaginally See admin instructions. 1g vaginally once to three times a week as needed for comfort with UTI 09/22/20   Copland, Gay Filler, MD  Multiple Vitamins-Minerals (MULTIVITAMIN ADULTS) TABS Take 1 tablet by mouth daily.    [provider]  phenazopyridine (PYRIDIUM) 200 MG tablet Take 1 tablet (200 mg total) by mouth 3 (three) times daily as needed for pain. 03/27/21   Florencia Reasons, MD  phenazopyridine (PYRIDIUM) 95 MG tablet Take 1 tablet (95 mg total) by mouth 3 (three) times daily as needed for pain. 05/01/21   Jaquita Folds, MD    Current Outpatient Medications  Medication Sig Dispense Refill   aspirin EC 81 MG tablet Take 81 mg by mouth daily. Swallow whole.     atorvastatin (LIPITOR) 20 MG tablet Take 1 tablet (20 mg total) by mouth daily. 30 tablet 0   HYDROcodone-acetaminophen (NORCO) 10-325 MG tablet Take 1 tablet by mouth every 6 (six)  hours as needed. 120 tablet 0   losartan (COZAAR) 25 MG tablet Take 1 tablet (25 mg total) by mouth daily. 90 tablet 3   nitrofurantoin, macrocrystal-monohydrate, (MACROBID) 100 MG capsule Take 1 capsule (100 mg total) by mouth daily. 90 capsule 1   cyclobenzaprine (FLEXERIL) 5 MG tablet Take 1 tablet (5 mg total) by mouth at bedtime as needed for muscle spasms. 30 tablet 1   estradiol (ESTRACE VAGINAL) 0.1 MG/GM vaginal cream Apply 1gm vaginally 1-3x a week as needed for comfort (Patient taking differently: Place 1 g vaginally See admin instructions. 1g vaginally once to three times a week as needed for comfort with UTI) 42.5 g 12   Multiple Vitamins-Minerals (MULTIVITAMIN ADULTS) TABS Take 1 tablet by mouth daily.     phenazopyridine (PYRIDIUM) 200 MG tablet Take 1 tablet (200 mg total) by mouth 3 (three) times daily as  needed for pain. 40 tablet 0   phenazopyridine (PYRIDIUM) 95 MG tablet Take 1 tablet (95 mg total) by mouth 3 (three) times daily as needed for pain. 90 tablet 1   Current Facility-Administered Medications  Medication Dose Route Frequency Provider Last Rate Last Admin   0.9 %  sodium chloride infusion  500 mL Intravenous Once Cayli Escajeda V, DO        Allergies as of 06/18/2021 - Review Complete 06/18/2021  Allergen Reaction Noted   Darvon [propoxyphene] Nausea And Vomiting and Palpitations 06/08/2020   Adhesive [tape] Rash 06/06/2019   Lisinopril Cough 04/19/2016   Morphine and related Nausea And Vomiting 06/18/2017    Family History  Problem Relation Age of Onset   Other Mother        complications from flu   Other Father        unsure of cause   Colon cancer Neg Hx     Social History   Socioeconomic History   Marital status: Widowed    Spouse name: Not on file   Number of children: 3   Years of education: 16 years   Highest education level: Not on file  Occupational History   Occupation: Retired  Tobacco Use   Smoking status: Former    Packs/day:  0.50    Years: 20.00    Pack years: 10.00    Types: Cigarettes    Quit date: 02/03/1976    Years since quitting: 45.4   Smokeless tobacco: Never  Vaping Use   Vaping Use: Never used  Substance and Sexual Activity   Alcohol use: No   Drug use: No   Sexual activity: Not Currently    Birth control/protection: Post-menopausal  Other Topics Concern   Not on file  Social History Narrative   Lives at home with husband.   Right-handed.   No caffeine use.   Social Determinants of Health   Financial Resource Strain: Not on file  Food Insecurity: Not on file  Transportation Needs: Not on file  Physical Activity: Not on file  Stress: Not on file  Social Connections: Not on file  Intimate Partner Violence: Not on file    Physical Exam: Vital signs in last 24 hours: @BP  (!) 153/69    Pulse 80    Temp 98.6 F (37 C) (Temporal)    Ht 5\' 6"  (1.676 m)    Wt 206 lb (93.4 kg)    SpO2 98%    BMI 33.25 kg/m  GEN: NAD EYE: Sclerae anicteric ENT: MMM CV: Non-tachycardic Pulm: CTA b/l GI: Soft, NT/ND NEURO:  Alert & Oriented x 3   Gerrit Heck, DO [facility]   06/18/2021 10:15 AM

## 2021-06-22 ENCOUNTER — Telehealth: Payer: Self-pay | Admitting: *Deleted

## 2021-06-22 NOTE — Telephone Encounter (Signed)
°  Follow up Call-  Call back number 06/18/2021  Post procedure Call Back phone  # [phone]  Permission to leave phone message Yes  Some recent data might be hidden     Patient questions:  Do you have a fever, pain , or abdominal swelling? No. Pain Score  0 *  Have you tolerated food without any problems? Yes.    Have you been able to return to your normal activities? Yes.    Do you have any questions about your discharge instructions: Diet   No. Medications  No. Follow up visit  No.  Do you have questions or concerns about your Care? No.  Actions: * If pain score is 4 or above: No action needed, pain <4.

## 2021-06-23 ENCOUNTER — Encounter: Payer: Self-pay | Admitting: Gastroenterology

## 2021-06-23 DIAGNOSIS — M1990 Unspecified osteoarthritis, unspecified site: Secondary | ICD-10-CM | POA: Diagnosis not present

## 2021-06-23 DIAGNOSIS — I10 Essential (primary) hypertension: Secondary | ICD-10-CM | POA: Diagnosis not present

## 2021-06-23 DIAGNOSIS — M818 Other osteoporosis without current pathological fracture: Secondary | ICD-10-CM | POA: Diagnosis not present

## 2021-06-23 DIAGNOSIS — R269 Unspecified abnormalities of gait and mobility: Secondary | ICD-10-CM | POA: Diagnosis not present

## 2021-06-23 DIAGNOSIS — E785 Hyperlipidemia, unspecified: Secondary | ICD-10-CM | POA: Diagnosis not present

## 2021-06-23 DIAGNOSIS — F0390 Unspecified dementia without behavioral disturbance: Secondary | ICD-10-CM | POA: Diagnosis not present

## 2021-06-26 ENCOUNTER — Ambulatory Visit (HOSPITAL_BASED_OUTPATIENT_CLINIC_OR_DEPARTMENT_OTHER)
Admission: RE | Admit: 2021-06-26 | Discharge: 2021-06-26 | Disposition: A | Discharge status: Home / Self Care | Payer: Medicare Other | Source: Ambulatory Visit | Attending: Hematology & Oncology | Admitting: Hematology & Oncology

## 2021-06-26 ENCOUNTER — Encounter: Payer: Self-pay | Admitting: Hematology & Oncology

## 2021-06-26 ENCOUNTER — Other Ambulatory Visit: Payer: Self-pay

## 2021-06-26 ENCOUNTER — Encounter (HOSPITAL_BASED_OUTPATIENT_CLINIC_OR_DEPARTMENT_OTHER): Payer: Self-pay

## 2021-06-26 ENCOUNTER — Other Ambulatory Visit: Payer: Self-pay | Admitting: Family

## 2021-06-26 ENCOUNTER — Inpatient Hospital Stay: Payer: Medicare Other | Attending: Hematology & Oncology

## 2021-06-26 DIAGNOSIS — C186 Malignant neoplasm of descending colon: Secondary | ICD-10-CM

## 2021-06-26 DIAGNOSIS — Z853 Personal history of malignant neoplasm of breast: Secondary | ICD-10-CM | POA: Insufficient documentation

## 2021-06-26 DIAGNOSIS — M810 Age-related osteoporosis without current pathological fracture: Secondary | ICD-10-CM | POA: Insufficient documentation

## 2021-06-26 DIAGNOSIS — Z79899 Other long term (current) drug therapy: Secondary | ICD-10-CM | POA: Insufficient documentation

## 2021-06-26 DIAGNOSIS — K7689 Other specified diseases of liver: Secondary | ICD-10-CM | POA: Diagnosis not present

## 2021-06-26 DIAGNOSIS — I700 Atherosclerosis of aorta: Secondary | ICD-10-CM | POA: Diagnosis not present

## 2021-06-26 DIAGNOSIS — C189 Malignant neoplasm of colon, unspecified: Secondary | ICD-10-CM | POA: Diagnosis not present

## 2021-06-26 DIAGNOSIS — Z9221 Personal history of antineoplastic chemotherapy: Secondary | ICD-10-CM | POA: Insufficient documentation

## 2021-06-26 DIAGNOSIS — C19 Malignant neoplasm of rectosigmoid junction: Secondary | ICD-10-CM | POA: Diagnosis not present

## 2021-06-26 DIAGNOSIS — I2510 Atherosclerotic heart disease of native coronary artery without angina pectoris: Secondary | ICD-10-CM | POA: Diagnosis not present

## 2021-06-26 DIAGNOSIS — M47816 Spondylosis without myelopathy or radiculopathy, lumbar region: Secondary | ICD-10-CM | POA: Diagnosis not present

## 2021-06-26 DIAGNOSIS — K5730 Diverticulosis of large intestine without perforation or abscess without bleeding: Secondary | ICD-10-CM | POA: Diagnosis not present

## 2021-06-26 DIAGNOSIS — Z85038 Personal history of other malignant neoplasm of large intestine: Secondary | ICD-10-CM | POA: Insufficient documentation

## 2021-06-26 LAB — CMP (CANCER CENTER ONLY)
ALT: 34 U/L (ref 0–44)
AST: 43 U/L — ABNORMAL HIGH (ref 15–41)
Albumin: 4.2 g/dL (ref 3.5–5.0)
Alkaline Phosphatase: 101 U/L (ref 38–126)
Anion gap: 6 (ref 5–15)
BUN: 13 mg/dL (ref 8–23)
CO2: 29 mmol/L (ref 22–32)
Calcium: 10.2 mg/dL (ref 8.9–10.3)
Chloride: 101 mmol/L (ref 98–111)
Creatinine: 0.65 mg/dL (ref 0.44–1.00)
GFR, Estimated: >60 mL/min (ref >60)
Glucose, Bld: 95 mg/dL (ref 70–99)
Potassium: 4.1 mmol/L (ref 3.5–5.1)
Sodium: 136 mmol/L (ref 135–145)
Total Bilirubin: 0.6 mg/dL (ref 0.3–1.2)
Total Protein: 7.4 g/dL (ref 6.5–8.1)

## 2021-06-26 LAB — CBC WITH DIFFERENTIAL (CANCER CENTER ONLY)
Abs Immature Granulocytes: 0.02 10*3/uL (ref 0.00–0.07)
Basophils Absolute: 0 10*3/uL (ref 0.0–0.1)
Basophils Relative: 0 %
Eosinophils Absolute: 0.3 10*3/uL (ref 0.0–0.5)
Eosinophils Relative: 5 %
HCT: 41.7 % (ref 36.0–46.0)
Hemoglobin: 13.6 g/dL (ref 12.0–15.0)
Immature Granulocytes: 0 %
Lymphocytes Relative: 30 %
Lymphs Abs: 2 10*3/uL (ref 0.7–4.0)
MCH: 29.4 pg (ref 26.0–34.0)
MCHC: 32.6 g/dL (ref 30.0–36.0)
MCV: 90.1 fL (ref 80.0–100.0)
Monocytes Absolute: 0.5 10*3/uL (ref 0.1–1.0)
Monocytes Relative: 8 %
Neutro Abs: 3.8 10*3/uL (ref 1.7–7.7)
Neutrophils Relative %: 57 %
Platelet Count: 136 10*3/uL — ABNORMAL LOW (ref 150–400)
RBC: 4.63 MIL/uL (ref 3.87–5.11)
RDW: 12.8 % (ref 11.5–15.5)
WBC Count: 6.7 10*3/uL (ref 4.0–10.5)
nRBC: 0 % (ref 0.0–0.2)

## 2021-06-26 LAB — LACTATE DEHYDROGENASE: LDH: 162 U/L (ref 98–192)

## 2021-06-26 LAB — CEA (IN HOUSE-CHCC): CEA (CHCC-In House): 1.84 ng/mL (ref 0.00–5.00)

## 2021-06-26 MED ORDER — IOHEXOL 300 MG/ML  SOLN
100.0000 mL | Freq: Once | INTRAMUSCULAR | Status: AC | PRN
  Administered 2021-06-26: 100 mL via INTRAVENOUS

## 2021-06-29 ENCOUNTER — Other Ambulatory Visit: Payer: Medicare Other

## 2021-06-29 ENCOUNTER — Other Ambulatory Visit: Payer: Self-pay

## 2021-06-29 ENCOUNTER — Telehealth: Payer: Self-pay | Admitting: *Deleted

## 2021-06-29 ENCOUNTER — Inpatient Hospital Stay (HOSPITAL_BASED_OUTPATIENT_CLINIC_OR_DEPARTMENT_OTHER): Payer: Medicare Other | Admitting: Hematology & Oncology

## 2021-06-29 ENCOUNTER — Other Ambulatory Visit (HOSPITAL_BASED_OUTPATIENT_CLINIC_OR_DEPARTMENT_OTHER): Payer: Medicare Other

## 2021-06-29 ENCOUNTER — Ambulatory Visit (HOSPITAL_BASED_OUTPATIENT_CLINIC_OR_DEPARTMENT_OTHER): Payer: Medicare Other

## 2021-06-29 ENCOUNTER — Encounter: Payer: Self-pay | Admitting: *Deleted

## 2021-06-29 ENCOUNTER — Ambulatory Visit (HOSPITAL_BASED_OUTPATIENT_CLINIC_OR_DEPARTMENT_OTHER): Admission: RE | Admit: 2021-06-29 | Payer: Medicare Other | Source: Ambulatory Visit

## 2021-06-29 ENCOUNTER — Encounter: Payer: Self-pay | Admitting: Hematology & Oncology

## 2021-06-29 ENCOUNTER — Ambulatory Visit: Payer: Medicare Other | Admitting: Hematology & Oncology

## 2021-06-29 VITALS — BP 146/60 | HR 78 | Temp 98.2°F | Resp 18 | Wt 203.0 lb

## 2021-06-29 DIAGNOSIS — C50911 Malignant neoplasm of unspecified site of right female breast: Secondary | ICD-10-CM

## 2021-06-29 DIAGNOSIS — Z170 Estrogen receptor positive status [ER+]: Secondary | ICD-10-CM

## 2021-06-29 DIAGNOSIS — C186 Malignant neoplasm of descending colon: Secondary | ICD-10-CM | POA: Diagnosis not present

## 2021-06-29 DIAGNOSIS — Z853 Personal history of malignant neoplasm of breast: Secondary | ICD-10-CM | POA: Diagnosis not present

## 2021-06-29 DIAGNOSIS — Z85038 Personal history of other malignant neoplasm of large intestine: Secondary | ICD-10-CM | POA: Diagnosis not present

## 2021-06-29 DIAGNOSIS — Z79899 Other long term (current) drug therapy: Secondary | ICD-10-CM | POA: Diagnosis not present

## 2021-06-29 DIAGNOSIS — Z9221 Personal history of antineoplastic chemotherapy: Secondary | ICD-10-CM | POA: Diagnosis not present

## 2021-06-29 DIAGNOSIS — M810 Age-related osteoporosis without current pathological fracture: Secondary | ICD-10-CM | POA: Diagnosis not present

## 2021-06-29 NOTE — Progress Notes (Signed)
Oncology Nurse Navigator Documentation  Oncology Nurse Navigator Flowsheets 06/29/2021  Confirmed Diagnosis Date -  Diagnosis Status -  Planned Course of Treatment -  Phase of Treatment -  Chemotherapy Pending- Reason: -  Chemotherapy Actual Start Date: -  Navigator Follow Up Date: -  Navigator Follow Up Reason: -  Navigation Complete Date: 06/29/2021  Post Navigation: Continue to Follow Patient? No  Reason Not Navigating Patient: No Treatment, Observation Only  Navigator Restaurant manager, fast food Encounter Type Appt/Treatment Plan Review  Telephone -  Treatment Initiated Date -  Patient Visit Type MedOnc  Treatment Phase Post-Tx Follow-up  Barriers/Navigation Needs No Barriers At This Time  Education -  Interventions None Required  Acuity Level 1-No Barriers  Coordination of Care -  Education Method -  Time Spent with Patient 15

## 2021-06-29 NOTE — Telephone Encounter (Signed)
Per 06/29/21 los - gave upcoming appointments

## 2021-06-29 NOTE — Progress Notes (Signed)
Hematology and Oncology Follow Up Visit  Telsa Lee [id number] [birth year] 88 y.o. 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 Year -- next dose on 11/2021   Interim History:  [name] is here today for follow-up.  She got through all of the holiday season.  I am just very happy for her.  She had no problems over the holidays.  We did do a CT scan on her today.  The CT scan did not show any evidence of recurrence of the colon cancer.  She is eating okay.  She is having no problems with bleeding.  There is no change in bowel or bladder habits.  She has had no issues with cough or shortness of breath.  Her last CEA level just done last week was 1.84.  She has had no leg swelling.  She has had no chest wall pain.  She does have a fairly large ventral abdominal wall hernia.  This is troubling her.  We will have to see if Dr. Ninfa Linden of surgery will be able to see her and let her know if anything needs to be done with this.  Overall, her performance status is ECOG 2.  Medications:  Allergies as of 06/29/2021       Reactions   Darvon [propoxyphene] Nausea And Vomiting, Palpitations   Darvocet Causes Sweats   Adhesive [tape] Rash   Lisinopril Cough   09/25/2020 Patient states she has never taken Lisinopril. Other reaction(s): Cough   Morphine And Related Nausea And Vomiting        Medication List        Accurate as of June 29, 2021  9:22 AM. If you have any questions, ask your nurse or doctor.          aspirin EC 81 MG tablet Take 81 mg by mouth daily. Swallow whole.   atorvastatin 20 MG tablet Commonly known as:  LIPITOR Take 1 tablet (20 mg total) by mouth daily.   cyclobenzaprine 5 MG tablet Commonly known as: FLEXERIL Take 1 tablet (5 mg total) by mouth at bedtime as needed for muscle spasms.   estradiol 0.1 MG/GM vaginal cream Commonly known as: ESTRACE VAGINAL Apply 1gm vaginally 1-3x a week as needed for comfort What changed:  how much to take how to take this when to take this additional instructions   fenofibrate 48 MG tablet Commonly known as: TRICOR Take 48 mg by mouth daily.   HYDROcodone-acetaminophen 10-325 MG tablet Commonly known as: NORCO Take 1 tablet by mouth every 6 (six) hours as needed.   losartan 25 MG tablet Commonly known as: COZAAR Take 1 tablet (25 mg total) by mouth daily.   Multivitamin Adults Tabs Take 1 tablet by mouth daily.   nitrofurantoin (macrocrystal-monohydrate) 100 MG capsule Commonly known as: Macrobid Take 1 capsule (100 mg total) by mouth daily.   phenazopyridine 200 MG tablet Commonly known as: Pyridium Take 1 tablet (200 mg total) by mouth 3 (three) times daily as needed for pain.   phenazopyridine 95 MG tablet Commonly known as: PYRIDIUM Take 1 tablet (95 mg total) by mouth 3 (three) times daily as  needed for pain.        Allergies:  Allergies  Allergen Reactions   Darvon [Propoxyphene] Nausea And Vomiting and Palpitations    Darvocet Causes Sweats   Adhesive [Tape] Rash   Lisinopril Cough    09/25/2020 Patient states she has never taken Lisinopril. Other reaction(s): Cough   Morphine And Related Nausea And Vomiting    Past Medical History, Surgical history, Social history, and Family History were reviewed and updated.  Review of Systems: Review of Systems  Constitutional: Negative.   HENT: Negative.    Eyes: Negative.   Respiratory: Negative.    Cardiovascular: Negative.   Gastrointestinal: Negative.   Genitourinary: Negative.   Musculoskeletal: Negative.   Skin: Negative.   Neurological: Negative.    Endo/Heme/Allergies: Negative.   Psychiatric/Behavioral: Negative.      Physical Exam:  weight is 203 lb (92.1 kg). Her oral temperature is 98.2 F (36.8 C). Her blood pressure is 146/60 (abnormal) and her pulse is 78. Her respiration is 18 and oxygen saturation is 98%.   Wt Readings from Last 3 Encounters:  06/29/21 203 lb (92.1 kg)  06/18/21 206 lb (93.4 kg)  06/16/21 206 lb 2 oz (93.5 kg)    Physical Exam Vitals reviewed.  HENT:     Head: Normocephalic and atraumatic.  Eyes:     Pupils: Pupils are equal, round, and reactive to light.  Cardiovascular:     Rate and Rhythm: Normal rate and regular rhythm.     Heart sounds: Normal heart sounds.  Pulmonary:     Effort: Pulmonary effort is normal.     Breath sounds: Normal breath sounds.  Abdominal:     General: Bowel sounds are normal.     Palpations: Abdomen is soft.     Comments: Her abdomen is soft.  She does have the ventral abdominal wall hernia in the lower part of the abdomen.  This is just above the umbilicus.  It does appear to be reducible.  Musculoskeletal:        General: No tenderness or deformity. Normal range of motion.     Cervical back: Normal range of motion.  Lymphadenopathy:     Cervical: No cervical adenopathy.  Skin:    General: Skin is warm and dry.     Findings: No erythema or rash.  Neurological:     Mental Status: She is alert and oriented to person, place, and time.  Psychiatric:        Behavior: Behavior normal.        Thought Content: Thought content normal.        Judgment: Judgment normal.    Lab Results  Component Value Date   WBC 6.7 06/26/2021   HGB 13.6 06/26/2021   HCT 41.7 06/26/2021   MCV 90.1 06/26/2021   PLT 136 (L) 06/26/2021   Lab Results  Component Value Date   FERRITIN 45 06/09/2020   IRON 49 06/09/2020   TIBC 320 06/09/2020   UIBC 271 06/09/2020   IRONPCTSAT 15 06/09/2020   Lab Results  Component Value Date   RETICCTPCT 1.6 06/09/2020   RBC 4.63 06/26/2021    No results found for: [name], [facility] Lab Results  Component Value Date   IGGSERUM 1,085 02/09/2017   IGMSERUM 336 (H) 02/09/2017   No results found for: [name], SPEI   Chemistry      Component Value Date/Time   NA 136 06/26/2021 1015   NA 139 02/25/2020 1627  K 4.1 06/26/2021 1015   CL 101 06/26/2021 1015   CO2 29 06/26/2021 1015   BUN 13 06/26/2021 1015   BUN 11 02/25/2020 1627   CREATININE 0.65 06/26/2021 1015      Component Value Date/Time   CALCIUM 10.2 06/26/2021 1015   ALKPHOS 101 06/26/2021 1015   AST 43 (H) 06/26/2021 1015   ALT 34 06/26/2021 1015   BILITOT 0.6 06/26/2021 1015       Impression and Plan:  [name] is a very pleasant 88 yo caucasian female with history of bilateral stage Ia breast cancer diagnosed back in 1998. She now has locally advanced colon cancer stage IIIb with 3+ lymph nodes.  She completed all of her adjuvant therapy in July.  I am happy about the CT scan looking so good.  Again, I am not sure if there is anything that can be done about this abdominal wall hernia.  However, we will let Dr. Ninfa Linden take a look at her so he can decide if anything needs to be done.  I do not think we need to do any scans on her for about 6 months now.  I would like to see her back in 3 months just to see how everything goes with this hernia.    [name], MD 1/30/20239:22 AM

## 2021-07-01 ENCOUNTER — Other Ambulatory Visit: Payer: Self-pay

## 2021-07-01 ENCOUNTER — Encounter: Payer: Medicare Other | Attending: Physical Medicine & Rehabilitation | Admitting: Registered Nurse

## 2021-07-01 VITALS — BP 148/71 | HR 77 | Ht 66.0 in | Wt 206.0 lb

## 2021-07-01 DIAGNOSIS — M7061 Trochanteric bursitis, right hip: Secondary | ICD-10-CM | POA: Diagnosis not present

## 2021-07-01 DIAGNOSIS — R202 Paresthesia of skin: Secondary | ICD-10-CM | POA: Diagnosis not present

## 2021-07-01 DIAGNOSIS — M62838 Other muscle spasm: Secondary | ICD-10-CM | POA: Insufficient documentation

## 2021-07-01 DIAGNOSIS — Z5181 Encounter for therapeutic drug level monitoring: Secondary | ICD-10-CM | POA: Insufficient documentation

## 2021-07-01 DIAGNOSIS — M7062 Trochanteric bursitis, left hip: Secondary | ICD-10-CM | POA: Insufficient documentation

## 2021-07-01 DIAGNOSIS — M961 Postlaminectomy syndrome, not elsewhere classified: Secondary | ICD-10-CM | POA: Insufficient documentation

## 2021-07-01 DIAGNOSIS — M170 Bilateral primary osteoarthritis of knee: Secondary | ICD-10-CM | POA: Diagnosis not present

## 2021-07-01 DIAGNOSIS — S32000S Wedge compression fracture of unspecified lumbar vertebra, sequela: Secondary | ICD-10-CM | POA: Diagnosis not present

## 2021-07-01 DIAGNOSIS — Z79899 Other long term (current) drug therapy: Secondary | ICD-10-CM | POA: Diagnosis not present

## 2021-07-01 DIAGNOSIS — G894 Chronic pain syndrome: Secondary | ICD-10-CM | POA: Diagnosis not present

## 2021-07-01 DIAGNOSIS — M2550 Pain in unspecified joint: Secondary | ICD-10-CM | POA: Insufficient documentation

## 2021-07-01 MED ORDER — HYDROCODONE-ACETAMINOPHEN 10-325 MG PO TABS: 1.0000 | ORAL_TABLET | Freq: Four times a day (QID) | ORAL | 0 refills | Status: DC | PRN

## 2021-07-01 NOTE — Progress Notes (Signed)
Subjective:    Patient ID: Crystal Lee, female    DOB: [birth year], 88 y.o.   MRN: [id number]  HPI: Crystal Lee is a 88 y.o. female who returns for follow up appointment for chronic pain and medication refill. She states her pain is located in her lower back, bilateral lower extremities with numbness and tingling and bilateral hip pain. She also reports bilateral knee pain and generalized joint pain. She rates her pain 5. Her current exercise regime is walking with her walker.  Crystal Lee equivalent is 40.00 MME.   Last Oral Swab was Performed on 05/14/2021, it was consistent.     Pain Inventory Average Pain 5 Pain Right Now 5 My pain is sharp, burning, and aching  In the last 24 hours, has pain interfered with the following? General activity 5 Relation with others 3 Enjoyment of life 5 What TIME of day is your pain at its worst? evening and night Sleep (in general) Fair  Pain is worse with: walking, bending, standing, and some activites Pain improves with: rest, heat/ice, and medication Relief from Meds: 2  Family History  Problem Relation Age of Onset   Other Mother        complications from flu   Other Father        unsure of cause   Colon cancer Neg Hx    Social History   Socioeconomic History   Marital status: Widowed    Spouse name: Not on file   Number of children: 3   Years of education: 16 years   Highest education level: Not on file  Occupational History   Occupation: Retired  Tobacco Use   Smoking status: Former    Packs/day: 0.50    Years: 20.00    Pack years: 10.00    Types: Cigarettes    Quit date: 02/03/1976    Years since quitting: 45.4   Smokeless tobacco: Never  Vaping Use   Vaping Use: Never used  Substance and Sexual Activity   Alcohol use: No   Drug use: No   Sexual activity: Not Currently    Birth control/protection: Post-menopausal  Other Topics Concern   Not on file  Social History Narrative   Lives at home with husband.    Right-handed.   No caffeine use.   Social Determinants of Health   Financial Resource Strain: Not on file  Food Insecurity: Not on file  Transportation Needs: Not on file  Physical Activity: Not on file  Stress: Not on file  Social Connections: Not on file   Past Surgical History:  Procedure Laterality Date   69 HOUR [place] STUDY N/A 06/21/2018   Procedure: 24 HOUR [place];  Surgeon: Lavena Bullion, DO;  [facility]: [facility];  Service: Gastroenterology;  Laterality: N/A;  with impedance   ABDOMINAL HYSTERECTOMY  2010   ANAL RECTAL MANOMETRY N/A 09/19/2019   Procedure: ANO RECTAL MANOMETRY;  Surgeon: Mauri Pole, MD;  [facility]: [facility];  Service: Endoscopy;  Laterality: N/A;   [place]   BIOPSY  06/10/2020   Procedure: BIOPSY;  Surgeon: Lavena Bullion, DO;  [facility]: [facility];  Service: Gastroenterology;;  EGD and COLON   BREAST SURGERY     CATARACT EXTRACTION Bilateral 2011   CHOLECYSTECTOMY     COLON RESECTION N/A 06/12/2020   Procedure: LAPAROSCOPIC COLON RESECTION;  Surgeon: [name], MD;  [facility]: [facility];  Service: General;  Laterality: N/A;   COLONOSCOPY WITH  PROPOFOL N/A 06/10/2020   Procedure: COLONOSCOPY WITH PROPOFOL;  Surgeon: Lavena Bullion, DO;  [facility];  Service: Gastroenterology;  Laterality: N/A;   ESOPHAGEAL MANOMETRY N/A 06/21/2018   Procedure: ESOPHAGEAL MANOMETRY (EM);  Surgeon: Lavena Bullion, DO;  [facility];  Service: Gastroenterology;  Laterality: N/A;   ESOPHAGOGASTRODUODENOSCOPY (EGD) WITH PROPOFOL N/A 06/10/2020   Procedure: ESOPHAGOGASTRODUODENOSCOPY (EGD) WITH PROPOFOL;  Surgeon: Lavena Bullion, DO;  [facility];  Service: Gastroenterology;  Laterality: N/A;   GALLBLADDER SURGERY  2015   JOINT REPLACEMENT     RT TOTAL HIP / RT TOTAL KNEE   MASTECTOMY  1998   BILATERAL    [place] IMPEDANCE STUDY  06/21/2018   Procedure: [place] IMPEDANCE STUDY;   Surgeon: Lavena Bullion, DO;  [facility];  Service: Gastroenterology;;   POLYPECTOMY  06/10/2020   Procedure: POLYPECTOMY;  Surgeon: Lavena Bullion, DO;  [facility];  Service: Gastroenterology;;   SKIN CANCER EXCISION  2016   RT SIDE OF NOSE   SUBMUCOSAL TATTOO INJECTION  06/10/2020   Procedure: SUBMUCOSAL TATTOO INJECTION;  Surgeon: Lavena Bullion, DO;  [facility];  Service: Gastroenterology;;   [place]  2010   RIGHT   TOTAL HIP ARTHROPLASTY Left 05/09/2015   Procedure: LEFT TOTAL HIP ARTHROPLASTY ANTERIOR APPROACH;  Surgeon: Gaynelle Arabian, MD;  [facility];  Service: Orthopedics;  Laterality: Left;   TOTAL KNEE ARTHROPLASTY  2001   TUMOR REMOVAL  2012   ABDOMINAL - NON CANCEROUS   Past Surgical History:  Procedure Laterality Date   59 HOUR [place] STUDY N/A 06/21/2018   Procedure: 24 HOUR [place] STUDY;  Surgeon: Lavena Bullion, DO;  [facility];  Service: Gastroenterology;  Laterality: N/A;  with impedance   ABDOMINAL HYSTERECTOMY  2010   ANAL RECTAL MANOMETRY N/A 09/19/2019   Procedure: ANO RECTAL MANOMETRY;  Surgeon: Mauri Pole, MD;  [facility];  Service: Endoscopy;  Laterality: N/A;   [place]   BIOPSY  06/10/2020   Procedure: BIOPSY;  Surgeon: Lavena Bullion, DO;  [facility];  Service: Gastroenterology;;  EGD and COLON   BREAST SURGERY     CATARACT EXTRACTION Bilateral 2011   CHOLECYSTECTOMY     COLON RESECTION N/A 06/12/2020   Procedure: LAPAROSCOPIC COLON RESECTION;  Surgeon: [name], MD;  [facility];  Service: General;  Laterality: N/A;   COLONOSCOPY WITH PROPOFOL N/A 06/10/2020   Procedure: COLONOSCOPY WITH PROPOFOL;  Surgeon: Lavena Bullion, DO;  [facility];  Service: Gastroenterology;  Laterality: N/A;   ESOPHAGEAL MANOMETRY N/A 06/21/2018   Procedure: ESOPHAGEAL MANOMETRY (EM);   Surgeon: Lavena Bullion, DO;  [facility];  Service: Gastroenterology;  Laterality: N/A;   ESOPHAGOGASTRODUODENOSCOPY (EGD) WITH PROPOFOL N/A 06/10/2020   Procedure: ESOPHAGOGASTRODUODENOSCOPY (EGD) WITH PROPOFOL;  Surgeon: Lavena Bullion, DO;  [facility];  Service: Gastroenterology;  Laterality: N/A;   GALLBLADDER SURGERY  2015   JOINT REPLACEMENT     RT TOTAL HIP / RT TOTAL KNEE   MASTECTOMY  1998   BILATERAL    [place] IMPEDANCE STUDY  06/21/2018   Procedure: [place] IMPEDANCE STUDY;  Surgeon: Lavena Bullion, DO;  [facility];  Service: Gastroenterology;;   POLYPECTOMY  06/10/2020   Procedure: POLYPECTOMY;  Surgeon: Lavena Bullion, DO;  [facility];  Service: Gastroenterology;;   SKIN CANCER EXCISION  2016  RT SIDE OF NOSE   SUBMUCOSAL TATTOO INJECTION  06/10/2020   Procedure: SUBMUCOSAL TATTOO INJECTION;  Surgeon: Lavena Bullion, DO;  [facility];  Service: Gastroenterology;;   TONSILLECTOMY  1953   TOTAL HIP ARTHROPLASTY  2010   RIGHT   TOTAL HIP ARTHROPLASTY Left 05/09/2015   Procedure: LEFT TOTAL HIP ARTHROPLASTY ANTERIOR APPROACH;  Surgeon: Gaynelle Arabian, MD;  [facility];  Service: Orthopedics;  Laterality: Left;   TOTAL KNEE ARTHROPLASTY  2001   TUMOR REMOVAL  2012   ABDOMINAL - NON CANCEROUS   Past Medical History:  Diagnosis Date   Arthritis    Cancer ([place])    HX BREAST CANCER/ SKIN CANCER   Colon cancer ([place])    Complication of anesthesia    N/V WITH Lee   Difficulty sleeping    Fractured hip ([place])    LEFT - AUG 2016   GERD (gastroesophageal reflux disease)    Hyperlipidemia    Hypertension    Melanoma ([place])    Neuropathy    Nocturia    Osteopenia    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   PONV (postoperative nausea and vomiting)    PT STATES Lee CAUSED N/V   Rosacea    Sleep apnea    Stage 1 breast cancer, ER+, right (HCC) 07/04/2017   BP (!) 148/71    Pulse 77     Ht 5\' 6"  (1.676 m)    Wt 206 lb (93.4 kg)    SpO2 96%    BMI 33.25 kg/m   Opioid Risk Score:   Fall Risk Score:  `1  Depression screen PHQ 2/9  Depression screen [facility] 2/9 06/02/2021 05/14/2021 04/01/2021 02/04/2021 01/06/2021 01/06/2021 12/09/2020  Decreased Interest 0 0 0 0 0 0 0  Down, Depressed, Hopeless 0 0 0 0 - 0 0  PHQ - 2 Score 0 0 0 0 0 0 0  Some recent data might be hidden     Review of Systems  Musculoskeletal:  Positive for back pain.       Bilateral leg pain  All other systems reviewed and are negative.     Objective:   Physical Exam Vitals and nursing note reviewed.  Constitutional:      Appearance: Normal appearance.  Cardiovascular:     Rate and Rhythm: Normal rate and regular rhythm.     Pulses: Normal pulses.     Heart sounds: Normal heart sounds.  Pulmonary:     Effort: Pulmonary effort is normal.     Breath sounds: Normal breath sounds.  Musculoskeletal:     Cervical back: Normal range of motion and neck supple.     Comments: Normal Muscle Bulk and Muscle Testing Reveals:  Upper Extremities: Full ROM and Muscle Strength 5/5  Lumbar Paraspinal Tenderness: L-4-L-5 Bilateral Greater Trochanter Tenderness Lower Extremities : Full ROM and Muscle Strength 5/5 Arises from Chair slowly using walker for support Narrow Based Gait     Skin:    General: Skin is warm and dry.  Neurological:     Mental Status: She is alert and oriented to person, place, and time.  Psychiatric:        Mood and Affect: Mood normal.        Behavior: Behavior normal.         Assessment & Plan:  1. Chronic Bilateral Leg pain: Paresthesia: Continue to Monitor. 07/01/2021 2. Paresthesia Doreene Burke Radiculitis: Ms. Goehring has weaned herself off the Lyrica due to daytime drowsiness. We will  continue to monitor. 07/01/2021 3. Pain of Left Wrist/ : No complaints today. S/P Carpal Tunnel Release on 06/03/2017 by Dr. Ellene Route. Dr. Ellene Route Following. 07/01/2021. 4. Fracture of superior pubic ramus.   Dr. Wynelle Link Following.  Continue to monitor. 07/01/2021. 5. Bilateral Knee OA: Continue Voltaren Gel.  Continue to monitor. Orthopedist following. 07/01/2021 6. Polyarthralgia: Continue to alternate with heat and ice therapy. Continue current medication regime. Continue to monitor. 07/01/2021. 7. Chronic Pain Syndrome: Refilled::Hydrocodone 10/325 mg one tablet every 6 hours as needed for moderate pain #120. 07/01/2021. 8. Lumbar Compression Fracture L2 and L3:  Ms. Soh refused physical therapy. Continue with rest/ heat therapy. Continue to Monitor 07/01/2021. 9. Muscle Spasm: Continue  Flexeril 5 mg at HS. Continue to monitor. 07/01/2021. 10. Right Shoulder Tendonitis: No complaints today. Continue HEP as Tolerated. Alternate Ice and Heat Therapy. 07/01/2021  11. Greater Trochanter Bursitis: Continue to Monitor. Continue HEP as Tolerated. 07/01/2021   F/U in 1 Month

## 2021-07-02 ENCOUNTER — Telehealth: Payer: Self-pay | Admitting: Gastroenterology

## 2021-07-02 NOTE — Telephone Encounter (Signed)
Inbound call from patient states she have not heard from neurologist regarding her referral. Says it has been 2 weeks

## 2021-07-03 ENCOUNTER — Encounter: Payer: Self-pay | Admitting: Registered Nurse

## 2021-07-03 NOTE — Telephone Encounter (Signed)
Referral was placed over at [facility]. Do you know who was the Neurologist that you spoke with? This is the massage I received from them:  "Please advise I am not sure which provider they spoke in our office, pt is est with [facility]"

## 2021-07-07 NOTE — Telephone Encounter (Signed)
LVM to the patient that I left a message to the neurologist regarding her referral and I will get back to her once I hear from them.

## 2021-07-08 NOTE — Telephone Encounter (Signed)
Pt is scheduled with Penelope Galas, at [facility] on 07/09/21 @ 8am. Pt. Is already aware of her appointment.

## 2021-07-09 ENCOUNTER — Ambulatory Visit: Payer: Medicare Other | Admitting: Physician Assistant

## 2021-07-09 NOTE — Progress Notes (Incomplete)
Assessment/Plan:   Crystal Lee is a very pleasant 88 y.o. year old RH female with  a history of hypertension, hyperlipidemia, sleep apnea, stage IIIb colon cancer recently off Xeloda, on Zometa yearly, history of stage Ia breast cancer, anxiety, depression seen today for evaluation of memory loss. MoCA today     Recommendations:   Memory Loss   Check B12, TSH Discussed safety both in and out of the home.  Discussed the importance of regular daily schedule to maintain brain function.  Continue to monitor mood with PCP.  Monitor Driving Stay active at least 30 minutes at least 3 times a week.  Naps should be scheduled and should be no longer than 60 minutes and should not occur after 2 PM.  Control cardiovascular risk factors  Mediterranean diet is recommended  Folllow up in   months  Stroke like symptoms  Weakness Concerning for TIA versus migraine with aura ABCD2 score CT head unremarkable Check MRI/MRA head vc CTA head and neck  PT/OT Check lipid panel and A1C Return to clinic in *** months. Asa Lipitor Continue PT/OT    Subjective:    The patient is seen in neurologic consultation at the request of Cirigliano, [name], DO for the evaluation of memory.  The patient is accompanied by  who supplements the history. This is a 88 y.o. year old RH  female who has had memory issues for about   Diagnosed with small vessel disease, evidence of amyloid angiopathy, central nervous system degenerative disorder, likely mild to moderate Alzheimer's disease per review of notes.  She is seeking additional Neurologic opinion  Memory Repeats himself Disoriented when walking into a room Leaving objects  Ambulates   Falls Head injuries Wandering off  Drive Lives alone, has home health Mood Depression Irritability CW puzzles Word Finding Board Games Painting Coloring Sleeps Vivid Dreams Sleepwalking Hallucinations Paranoia Hygiene concerns Bathing Dressing Medications  pillbox Finances  Appetite  trouble swallowing.  Cooks.  stove on or the faucet on.   headaches, double vision, dizziness, focal numbness or tingling, unilateral weakness, tremors or anosmia. No history of seizures. Denies urine incontinence, retention.  She has a long history of constipation on Movantik denies OSA, ETOH or Tobacco. Family History    She was seen at the ED on 03/25/2021 for strokelike symptoms, as she was having difficulty reading for about 20 minutes, which brought her to the emergency department.  There was also some concern of possible right facial droop and right-sided weakness.  The ER neurologist evaluated, feeling that the patient had mild right facial droop with left upper quadrant that now.,  Recommending admission for further possible stroke work-up.  UA was concerning for UTI.  EKG showed sinus tachycardia.  COVID was negative.  MRI did not show acute CVA, but did show amyloid angiopathy.  EEG without seizures. 2Decho unremarkable. Neurologist recommended aspirin 81 mg on discharge, no DAPT was given concerning for amyloid angiopathy.  Lipitor 20 was added on discharge.  They recommended that she has a better control of her blood pressure to less than 140.    She was afebrile.  At the ER, she was able to move all the extremities. was not diagnosed with stroke.  Since then, she was followed by neurology, diagnosed with small vessel disease, evidence of amyloid angiopathy, central nervous system degenerative disorder, likely mild to moderate Alzheimer's disease.   Patient  never had a similar episode.Denies any history of TIA. Denies vertigo dizziness or vision changes.  Denies headaches, dysarthria or dysphagia. No confusion or seizures. Denies any chest pain, or shortness of breath. Denies any fever or chills, or night sweats. No tobacco. No new meds or hormonal supplements. Does take a regular ASA a day, with no other antiplatelets or anticoagulants.Denies any recent long  distance trips or recent surgeries. No sick contacts. No new stressors present in personal life Patient is compliant with his medications. .Patient is very active, exercising daily. He is not a diabetic. No family history of stroke Patient was not administered TPA  as is beyond time window for treatment consideration. Will admit for further evaluation and treatment.     " Dr. Krista Blue 05/05/21 Dementia without behavior change,             MoCA examination 18/30             MRI of the brain showed evidence of generalized atrophy mild supratentorium small vessel disease, evidence of amyloid angiopathy,             Most consistent with central nervous system degenerative disorder, likely mild to moderate Alzheimer's disease,             She has significant visual spatial disorientation, short-term memory loss, I have discussed with patient and her daughter, suggested her not driving, Gait abnormality             Likely multiple factorial, including aging, deconditioning, diffuse body achy pain, joints pain, hyperreflexia, bilateral Babinski signs,             MRI of cervical spine to rule out cervical spondylitic myelopathy             She reported multiple fracture without clear injury, bone density scan for possible osteoporosis,"     " lives alone, was driving until March 25, 2021, when she was found to have sudden onset confusion, difficulty reading, questionable right-sided symptoms, she was sent to hospital,  Extensive evaluation, personally reviewed MRI of the brain in October 2022, no acute abnormality, mild generalized atrophy, supratentorium small vessel disease, evidence of small hemorrhage consistent with amyloid angiopathy,  EEG was normal, UA and urine culture showed E. coli, resistant to bacterium, she was treated with Keflex 500 twice a day, was seen by urogynecologist Dr. Wannetta Sender, was put on Bactrim prophylactic treatment for 6 months, she had a history of frequent UTI  Laboratory  evaluation showed LDL 91, A1c 5.0, normal CBC, hemoglobin of 13.4, CMP, creatinine of 0.6, she was discharged with aspirin 81 mg daily, and Lipitor 20 mg daily  Today her main concern is diffuse body achy pain, worsening gait abnormality, she reported a history of pelvic fracture, lower extremity fracture without clear injury, she does have urinary urgency, frequency  CT of abdomen pelvic without contrast September 2022, subtle nodularity of liver contour suggestive of cirrhosis, apparently thickening in the cecal tip, suggest continued follow-up,   Patient denies significant memory loss, but today's MoCA examination was 18/30"   MRI brain  1. No evidence of acute intracranial abnormality, including infarct. 2. Numerous tiny scattered foci of susceptibility artifact throughout the cortex bilaterally, greatest in the parieto-occipital lobes. While nonspecific, findings could relate to prior microhemorrhages from amyloid angiopathy or prior microemboli. The distribution is atypical for hypertensive hemorrhages.    EMG/NCS 2019 bilateral lower extremity paresthesia, bilateral leg pain, EMG nerve conduction study then showed no large fiber peripheral neuropathy  "1. Chronic Bilateral Leg pain: Paresthesia: Continue to Monitor. 07/01/2021 2. Paresthesia Doreene Burke Radiculitis:  Crystal Lee has weaned herself off the Lyrica due to daytime drowsiness. We will continue to monitor. 07/01/2021 3. Pain of Left Wrist/ : No complaints today. S/P Carpal Tunnel Release on 06/03/2017 by Dr. Ellene Route. Dr. Ellene Route Following. 07/01/2021. 4. Fracture of superior pubic ramus.  Dr. Wynelle Link Following.  Continue to monitor. 07/01/2021. 5. Bilateral Knee OA: Continue Voltaren Gel.  Continue to monitor. Orthopedist following. 07/01/2021 6. Polyarthralgia: Continue to alternate with heat and ice therapy. Continue current medication regime. Continue to monitor. 07/01/2021. 7. Chronic Pain Syndrome: Refilled::Hydrocodone 10/325  mg one tablet every 6 hours as needed for moderate pain #120. 07/01/2021. 8. Lumbar Compression Fracture L2 and L3:  Ms. Chrobak refused physical therapy. Continue with rest/ heat therapy. Continue to Monitor 07/01/2021. 9. Muscle Spasm: Continue  Flexeril 5 mg at HS. Continue to monitor. 07/01/2021. 10. Right Shoulder Tendonitis: No complaints today. Continue HEP as Tolerated. Alternate Ice and Heat Therapy. 07/01/2021  11. Greater Trochanter Bursitis: Continue to Monitor. Continue HEP as Tolerated. 07/01/2021"  Recent labs 06/26/2021 shows unremarkable CBC except for platelet 136, iron studies normal, CMP normal    there are currently no effective therapeutics available to cure or halt the progression of cerebral amyloid angiopathy,  Cerebral amyloid angiopathy (CAA), defined as the accumulation of amyloid-beta (A?) on the vascular wall, is a major pathology of Alzheimer's disease (AD) and has been thought to be caused by the failure of A? clearance.  What is the progression of cerebral amyloid angiopathy? Some patients with CAA present with a progressive dementia, involving rapid cognitive decline over days or weeks. This rapid progression could be due to the additive effects of severe vascular amyloid, cortical hemorrhages and infarctions, white matter destruction, and accumulation of neuritic plaques  physical, occupational, or speech therapy. Sometimes, medicines that help improve memory, such as those for Alzheimer disease, are used. Seizures, also called amyloid spells, may be treated with anti-seizure drugs  This case highlights that CAA-ri can manifest as acute driving impairment, which can lead to dangerous motor vehicle accidents.   Several drugs -- known as monoclonal antibodies -- may prevent beta-amyloid from clumping into plaques or remove beta-amyloid plaques that have formed and help the body clear the beta-amyloid from the brain. Severe fatigue and weakness. Shortness of  breath. Numbness, tingling, or pain in the hands or feet. Swelling of the ankles and legs. Diarrhea, possibly with blood, or constipation. An enlarged tongue, which sometimes looks rippled around its edge.  Drowsiness. Headache (usually in a certain part of the head) Nervous system changes that may start suddenly, including confusion, delirium, double vision, decreased vision, sensation changes, speech problems, weakness, or paralysis. Seizures. Stupor or coma (rarely) Vomiting.  Allergies  Allergen Reactions   Darvon [Propoxyphene] Nausea And Vomiting and Palpitations    Darvocet Causes Sweats   Adhesive [Tape] Rash   Lisinopril Cough    09/25/2020 Patient states she has never taken Lisinopril. Other reaction(s): Cough   Morphine And Related Nausea And Vomiting    Current Outpatient Medications  Medication Instructions   aspirin EC 81 mg, Oral, Daily, Swallow whole.   atorvastatin (LIPITOR) 20 mg, Oral, Daily   cyclobenzaprine (FLEXERIL) 5 mg, Oral, At bedtime PRN   estradiol (ESTRACE VAGINAL) 0.1 MG/GM vaginal cream Apply 1gm vaginally 1-3x a week as needed for comfort   fenofibrate (TRICOR) 48 mg, Oral, Daily   HYDROcodone-acetaminophen (NORCO) 10-325 MG tablet 1 tablet, Oral, Every 6 hours PRN, Do Not Fill Before 07/05/2021   losartan (COZAAR) 25 mg,  Oral, Daily   Multiple Vitamins-Minerals (MULTIVITAMIN ADULTS) TABS 1 tablet, Oral, Daily   nitrofurantoin (macrocrystal-monohydrate) (MACROBID) 100 mg, Oral, Daily   phenazopyridine (PYRIDIUM) 200 mg, Oral, 3 times daily PRN   phenazopyridine (PYRIDIUM) 95 mg, Oral, 3 times daily PRN     VITALS:  There were no vitals filed for this visit. Depression screen [facility] 2/9 06/02/2021 05/14/2021 04/01/2021 02/04/2021 01/06/2021  Decreased Interest 0 0 0 0 0  Down, Depressed, Hopeless 0 0 0 0 -  PHQ - 2 Score 0 0 0 0 0  Some recent data might be hidden    PHYSICAL EXAM   HEENT:  Normocephalic, atraumatic. The mucous membranes are  moist. The superficial temporal arteries are without ropiness or tenderness. Cardiovascular: Regular rate and rhythm. Lungs: Clear to auscultation bilaterally. Neck: There are no carotid bruits noted bilaterally.  NEUROLOGICAL: Montreal Cognitive Assessment  05/05/2021  Visuospatial/ Executive (0/5) 2  Naming (0/3) 3  Attention: Read list of digits (0/2) 2  Attention: Read list of letters (0/1) 1  Attention: Serial 7 subtraction starting at 100 (0/3) 1  Language: Repeat phrase (0/2) 1  Language : Fluency (0/1) 0  Abstraction (0/2) 2  Delayed Recall (0/5) 0  Orientation (0/6) 6  Total 18   No flowsheet data found.  No flowsheet data found.        Thank you for allowing [place] the opportunity to participate in the care of this nice patient. Please do not hesitate to contact us for any questions or concerns.   Total time spent on today's visit was 60 minutes, including both face-to-face time and nonface-to-face time.  Time included that spent on review of records (prior notes available to me/labs/imaging if pertinent), discussing treatment and goals, answering patient's questions and coordinating care.  Cc:  Copland, Gay Filler, MD  Sharene Butters 07/09/2021 6:44 AM

## 2021-07-09 NOTE — Progress Notes (Signed)
Assessment/Plan:   Crystal Lee is a very pleasant 88 y.o. year old RH female with  a history of hypertension, hyperlipidemia, sleep apnea, stage IIIb colon cancer recently off Xeloda, on Zometa yearly, history of stage Ia breast cancer, anxiety, depression seen today for evaluation of memory loss. Recent MoCA was 18/30, patient did not wish to test today.  However during her visit, she is able to have a conversation and answer questions properly.  She does know did date to, and place.  She was also able to memorize 3 words without difficulty with excellent delayed recall.  MRI of the brain is remarkable for numerical satiety scattered foci of susceptibility artifact throughout the cortex bilaterally, greatest in the parieto-occipital lobes suspicious for either prior microhemorrhages from amyloid angiopathy due to possible Alzheimer's disease, or prior microemboli. The distribution is atypical for hypertensive hemorrhages.  This was taking when the patient was brought to the hospital for evaluation of TIA, with essentially negative work-up except for the abnormalities on her MRI.  Neurology evaluated head at the hospital, recommending good control of her blood pressure not above 140, Lipitor and baby aspirin.  In today's visit, her neurological exam is unremarkable.  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  Discussed the importance of regular daily schedule to maintain brain function.  Continue brain stimulating exercises. Continue to monitor mood with PCP.  Start donepezil 10 mg, take half tablet 5 mg daily for 2 weeks, then increase to the full tablet at 10 mg daily. Recommend LP for diagnosis of Alzheimer's disease or other etiology, patient wants to take a few days to think about it, and then will  proceed with the test. Monitor Driving Stay active at least 30 minutes at least 3 times a week.  Naps should be scheduled and should be no longer than 60 minutes and should not occur after 2 PM.  Control cardiovascular risk factors, continue aspirin and Lipitor. Mediterranean diet is recommended  Folllow up in 6 months  Stroke like symptoms, resolved.  Possible history of TIA in the year 2000. Continue baby aspirin and Lipitor  continue monitoring blood pressure, and recommended note higher than 166 systolic. Mediterranean diet is recommended   Subjective:    The patient is seen in neurologic consultation at the request of Copland, Gay Filler, MD for the evaluation of memory.  The patient is here alone.  This is a 88 y.o. year old RH  female who presents today after hospitalization for strokelike symptoms.  It was also noted that she may have had cognitive issues during the presentation, at which time a MoCA was performed, yielding a result of 18/30.  Of note, the patient denies having any cognitive issues, treated in some of short-term memory difficulties to "old age ".  During the hospitalization, she was diagnosed with small vessel disease, evidence of amyloid angiopathy per MRI, central nervous system degenerative disorder, and mild to moderate Alzheimer's disease per review of the notes.  She was to follow-up at [facility], but she wanted to seek additional neurologic opinion with [facility] neurology instead. For her memory, she denies repeating herself, or being disoriented when walking into the room.  She denies leaving objects in unusual places.  She ambulates with her walker for safety.  She had prior falls, fracturing her pelvis, and "  living with pain over the last 88 years old older ".  She only had 1 minor head injury about 17 years ago while cleaning a window, and hitting her head while she was moving forward.  She did not lose any consciousness at that time.  She denies being disoriented when  walking, or wandering behavior.  She drives any distances, "without GPS I do not need it ".  She stopped driving long distances however when she came from [place] in 2018 to retire, and her vision was not as good.  Since her husband's death, she lives alone, and has home health visiting her.  Her mood is good, she denies depression or irritability.  She enjoys painting, and coloring.  She is to be able to do crossword puzzles but she stopped due to "eye problems ".  She sleeps well, denies vivid dreams or sleepwalking.  She denies hallucinations or paranoia.  There are no hygiene concerns.  She is independent of bathing and dressing.  Her medications are in a pillbox.  Her finances are done by her, only taxes are done by one of her children.  Her appetite is good, denies any trouble swallowing.  She cooks without forgetting any common recipes, or burning the stove.    As for her strokelike symptoms, the patient reports that she had similar episodes in the year 2000, when she noticed mild facial drooping and numbness, which quickly resolved, without recurrence.  On the day of evaluation in October 2022, she was at her doctor's office, when she had 20 second  episode (she denies this was 20-minute episodes as in chart).  Her PCP told her to go "downstairs today urgent care, to check that out ".  From the urgent care, the transported her to the ER, for further evaluation, suspecting that the patient was having a TIA.  Per chart notes, she may have had possible right facial droop and right-sided weakness, although she states that for the last 20 years, she has slightly decreased strength on the right side of her body.  The ER neurologist saw her, and work-up was initiated.  MRI of the brain did not show acute CVA, but did show amyloid angiopathy.  EEG was without seizures.  2D echo was unremarkable.  Neurology recommended aspirin 81 mg on discharge, and no DAPT was going to be given, because of concern of amyloid  angiopathy.  Lipitor 20 was added on discharge.  No further episodes of strokelike symptoms since then.  Of note, during that hospitalization, she also was found to have UTI "I know I had it, I have regular UTIs".  EKG shows sinus tachycardia.  COVID was negative.  Neurology recommended that she had a better control of her blood pressure to lays down on 263 systolic.  No stroke was diagnosed.  She denies today any headaches, double vision, dizziness, focal numbness or tingling, unilateral weakness other than the chronic mild right upper extremity as mentioned above, tremors or anosmia.  No history of seizures.  She denies urine incontinence, but occasionally wears a pad when going out.  She denies any retention.  She has a long history of constipation.  She denies any diarrhea.  She denies sleep apnea, although in chart it shows in certain areas that she may have it.  She denies any alcohol or tobacco.  Family history negative for dementia.  The patient is originally from [place].     UA and urine culture showed E. coli, resistant to bacterium, she was  treated with Keflex 500 twice a day, was seen by urogynecologist Dr. Wannetta Sender, was put on Bactrim prophylactic treatment for 6 months, she had a history of frequent UTI  Laboratory evaluation showed LDL 91, A1c 5.0, normal CBC, hemoglobin of 13.4, CMP, creatinine of 0.6, she was discharged with aspirin 81 mg daily, and Lipitor 20 mg daily     MRI brain  1. No evidence of acute intracranial abnormality, including infarct. 2. Numerous tiny scattered foci of susceptibility artifact throughout the cortex bilaterally, greatest in the parieto-occipital lobes. While nonspecific, findings could relate to prior microhemorrhages from amyloid angiopathy or prior microemboli. The distribution is atypical for hypertensive hemorrhages.    EMG/NCS 2019 bilateral lower extremity paresthesia, bilateral leg pain, EMG nerve conduction study then showed no large fiber  peripheral neuropathy   Allergies  Allergen Reactions   Darvon [Propoxyphene] Nausea And Vomiting and Palpitations    Darvocet Causes Sweats   Adhesive [Tape] Rash   Lisinopril Cough    09/25/2020 Patient states she has never taken Lisinopril. Other reaction(s): Cough   Morphine And Related Nausea And Vomiting    Current Outpatient Medications  Medication Instructions   aspirin EC 81 mg, Oral, Daily, Swallow whole.   atorvastatin (LIPITOR) 20 mg, Oral, Daily   cyclobenzaprine (FLEXERIL) 5 mg, Oral, At bedtime PRN   estradiol (ESTRACE VAGINAL) 0.1 MG/GM vaginal cream Apply 1gm vaginally 1-3x a week as needed for comfort   fenofibrate (TRICOR) 48 mg, Oral, Daily   HYDROcodone-acetaminophen (NORCO) 10-325 MG tablet 1 tablet, Oral, Every 6 hours PRN, Do Not Fill Before 07/05/2021   losartan (COZAAR) 25 mg, Oral, Daily   Multiple Vitamins-Minerals (MULTIVITAMIN ADULTS) TABS 1 tablet, Oral, Daily   nitrofurantoin (macrocrystal-monohydrate) (MACROBID) 100 mg, Oral, Daily   phenazopyridine (PYRIDIUM) 200 mg, Oral, 3 times daily PRN   phenazopyridine (PYRIDIUM) 95 mg, Oral, 3 times daily PRN     VITALS:  There were no vitals filed for this visit. Depression screen [facility] 2/9 06/02/2021 05/14/2021 04/01/2021 02/04/2021 01/06/2021  Decreased Interest 0 0 0 0 0  Down, Depressed, Hopeless 0 0 0 0 -  PHQ - 2 Score 0 0 0 0 0  Some recent data might be hidden    PHYSICAL EXAM   HEENT:  Normocephalic, atraumatic. The mucous membranes are moist. The superficial temporal arteries are without ropiness or tenderness. Cardiovascular: Regular rate and rhythm. Lungs: Clear to auscultation bilaterally. Neck: There are no carotid bruits noted bilaterally.  NEUROLOGICAL: Montreal Cognitive Assessment  05/05/2021  Visuospatial/ Executive (0/5) 2  Naming (0/3) 3  Attention: Read list of digits (0/2) 2  Attention: Read list of letters (0/1) 1  Attention: Serial 7 subtraction starting at 100 (0/3) 1   Language: Repeat phrase (0/2) 1  Language : Fluency (0/1) 0  Abstraction (0/2) 2  Delayed Recall (0/5) 0  Orientation (0/6) 6  Total 18   No flowsheet data found.  No flowsheet data found.     Orientation:  Alert and oriented to person, place and time. No aphasia or dysarthria. Fund of knowledge is appropriate. Recent memory impaired and remote memory intact.  Attention and concentration are normal.  Able to name objects and repeat phrases. Delayed recall   Cranial nerves: There is good facial symmetry. Extraocular muscles are intact and visual fields are full to confrontational testing. Speech is fluent and clear. Soft palate rises symmetrically and there is no tongue deviation. Hearing is intact to conversational tone. Tone: Tone is good throughout. Sensation: Sensation is  intact to light touch and pinprick throughout. Vibration is intact at the bilateral big toe.There is no extinction with double simultaneous stimulation. There is no sensory dermatomal level identified. Coordination: The patient has no difficulty with RAM's or FNF bilaterally. Normal finger to nose  Motor: Strength is 5/5 in the bilateral upper and lower extremities. There is no pronator drift.  There is difficulty to raise without the help of her hands, uses a walker to ambulate, gait is narrow and cautious, Romberg is normal.   Thank you for allowing [place] the opportunity to participate in the care of this nice patient. Please do not hesitate to contact us for any questions or concerns.   Total time spent on today's visit was 60 minutes, including both face-to-face time and nonface-to-face time.  Time included that spent on review of records (prior notes available to me/labs/imaging if pertinent), discussing treatment and goals, answering patient's questions and coordinating care.  Cc:  Copland, Gay Filler, MD  Sharene Butters 07/09/2021 12:41 PM

## 2021-07-10 ENCOUNTER — Encounter: Payer: Self-pay | Admitting: Physician Assistant

## 2021-07-10 ENCOUNTER — Ambulatory Visit (INDEPENDENT_AMBULATORY_CARE_PROVIDER_SITE_OTHER): Payer: Medicare Other | Admitting: Physician Assistant

## 2021-07-10 ENCOUNTER — Encounter: Payer: Self-pay | Admitting: Hematology & Oncology

## 2021-07-10 ENCOUNTER — Other Ambulatory Visit: Payer: Self-pay

## 2021-07-10 ENCOUNTER — Other Ambulatory Visit (INDEPENDENT_AMBULATORY_CARE_PROVIDER_SITE_OTHER): Payer: Medicare Other

## 2021-07-10 VITALS — BP 136/77 | HR 79 | Resp 18 | Ht 66.0 in | Wt 205.0 lb

## 2021-07-10 DIAGNOSIS — R413 Other amnesia: Secondary | ICD-10-CM

## 2021-07-10 DIAGNOSIS — F0150 Vascular dementia without behavioral disturbance: Secondary | ICD-10-CM | POA: Diagnosis not present

## 2021-07-10 DIAGNOSIS — R2990 Unspecified symptoms and signs involving the nervous system: Secondary | ICD-10-CM

## 2021-07-10 LAB — TSH: TSH: 1.68 u[IU]/mL (ref 0.35–5.50)

## 2021-07-10 LAB — VITAMIN B12: Vitamin B-12: 483 pg/mL (ref 211–911)

## 2021-07-10 MED ORDER — DONEPEZIL HCL 10 MG PO TABS: ORAL_TABLET | ORAL | 11 refills | Status: DC

## 2021-07-10 NOTE — Patient Instructions (Addendum)
It was a pleasure to see you today at our office.   Recommendations:  Meds: Follow up in  6 months Start Donepezil 10 mg :Take half tablet (5 mg) daily for 2 weeks, then increase to the full tablet at 10 mg daily.  Strong control of cardiovascular risk factors Continue baby aspirin and lipitor, follow up with primary doctor  Recommend LP for diagnosis  Check labs today     RECOMMENDATIONS FOR ALL PATIENTS WITH MEMORY PROBLEMS: 1. Continue to exercise (Recommend 30 minutes of walking everyday, or 3 hours every week) 2. Increase social interactions - continue going to [place] and enjoy social gatherings with friends and family 3. Eat healthy, avoid fried foods and eat more fruits and vegetables 4. Maintain adequate blood pressure, blood sugar, and blood cholesterol level. Reducing the risk of stroke and cardiovascular disease also helps promoting better memory. 5. Avoid stressful situations. Live a simple life and avoid aggravations. Organize your time and prepare for the next day in anticipation. 6. Sleep well, avoid any interruptions of sleep and avoid any distractions in the bedroom that may interfere with adequate sleep quality 7. Avoid sugar, avoid sweets as there is a strong link between excessive sugar intake, diabetes, and cognitive impairment We discussed the Mediterranean diet, which has been shown to help patients reduce the risk of progressive memory disorders and reduces cardiovascular risk. This includes eating fish, eat fruits and green leafy vegetables, nuts like almonds and hazelnuts, walnuts, and also use olive oil. Avoid fast foods and fried foods as much as possible. Avoid sweets and sugar as sugar use has been linked to worsening of memory function.  There is always a concern of gradual progression of memory problems. If this is the case, then we may need to adjust level of care according to patient needs. Support, both to the patient and caregiver, should then be put into  place.    The Alzheimers Association is here all day, every day for people facing Alzheimers disease through our free 24/7 Helpline: 9205122778. The Helpline provides reliable information and support to all those who need assistance, such as individuals living with memory loss, Alzheimer's or other dementia, caregivers, health care professionals and the public.  Our highly trained and knowledgeable staff can help you with: Understanding memory loss, dementia and Alzheimer's  Medications and other treatment options  General information about aging and brain health  Skills to provide quality care and to find the best care from professionals  Legal, financial and living-arrangement decisions Our Helpline also features: Confidential care consultation provided by master's level clinicians who can help with decision-making support, crisis assistance and education on issues families face every day  Help in a caller's preferred language using our translation service that features more than 200 languages and dialects  Referrals to local community programs, services and ongoing support     FALL PRECAUTIONS: Be cautious when walking. Scan the area for obstacles that may increase the risk of trips and falls. When getting up in the mornings, sit up at the edge of the bed for a few minutes before getting out of bed. Consider elevating the bed at the head end to avoid drop of blood pressure when getting up. Walk always in a well-lit room (use night lights in the walls). Avoid area rugs or power cords from appliances in the middle of the walkways. Use a walker or a cane if necessary and consider physical therapy for balance exercise. Get your eyesight checked regularly.  FINANCIAL  OVERSIGHT: Supervision, especially oversight when making financial decisions or transactions is also recommended.  HOME SAFETY: Consider the safety of the kitchen when operating appliances like stoves, microwave oven, and  blender. Consider having supervision and share cooking responsibilities until no longer able to participate in those. Accidents with firearms and other hazards in the house should be identified and addressed as well.   ABILITY TO BE LEFT ALONE: If patient is unable to contact 911 operator, consider using LifeLine, or when the need is there, arrange for someone to stay with patients. Smoking is a fire hazard, consider supervision or cessation. Risk of wandering should be assessed by caregiver and if detected at any point, supervision and safe proof recommendations should be instituted.  MEDICATION SUPERVISION: Inability to self-administer medication needs to be constantly addressed. Implement a mechanism to ensure safe administration of the medications.   DRIVING: Regarding driving, in patients with progressive memory problems, driving will be impaired. We advise to have someone else do the driving if trouble finding directions or if minor accidents are reported. Independent driving assessment is available to determine safety of driving.   If you are interested in the driving assessment, you can contact the following:  [employer] in [place]  [place] [phone] or [phone]      [place] refers to food and lifestyle choices that are based on the traditions of countries located on the [employer]. This way of eating has been shown to help prevent certain conditions and improve outcomes for people who have chronic diseases, like kidney disease and heart disease. What are tips for following this plan? Lifestyle  Cook and eat meals together with your family, when possible. Drink enough fluid to keep your urine clear or pale yellow. Be physically active every day. This includes: Aerobic exercise like running or swimming. Leisure  activities like gardening, walking, or housework. Get 7-8 hours of sleep each night. If recommended by your health care provider, drink red wine in moderation. This means 1 glass a day for nonpregnant women and 2 glasses a day for men. A glass of wine equals 5 oz (150 mL). Reading food labels  Check the serving size of packaged foods. For foods such as rice and pasta, the serving size refers to the amount of cooked product, not dry. Check the total fat in packaged foods. Avoid foods that have saturated fat or trans fats. Check the ingredients list for added sugars, such as corn syrup. Shopping  At the grocery store, buy most of your food from the areas near the walls of the store. This includes: Fresh fruits and vegetables (produce). Grains, beans, nuts, and seeds. Some of these may be available in unpackaged forms or large amounts (in bulk). Fresh seafood. Poultry and eggs. Low-fat dairy products. Buy whole ingredients instead of prepackaged foods. Buy fresh fruits and vegetables in-season from local farmers markets. Buy frozen fruits and vegetables in resealable bags. If you do not have access to quality fresh seafood, buy precooked frozen shrimp or canned fish, such as tuna, salmon, or sardines. Buy small amounts of raw or cooked vegetables, salads, or olives from the deli or salad bar at your store. Stock your pantry so you always have certain foods on hand, such as olive oil, canned tuna, canned tomatoes, rice, pasta, and beans. Cooking  Cook foods with extra-virgin olive oil instead of using butter or other vegetable oils. Have  meat as a side dish, and have vegetables or grains as your main dish. This means having meat in small portions or adding small amounts of meat to foods like pasta or stew. Use beans or vegetables instead of meat in common dishes like chili or lasagna. Experiment with different cooking methods. Try roasting or broiling vegetables instead of steaming or sauteing  them. Add frozen vegetables to soups, stews, pasta, or rice. Add nuts or seeds for added healthy fat at each meal. You can add these to yogurt, salads, or vegetable dishes. Marinate fish or vegetables using olive oil, lemon juice, garlic, and fresh herbs. Meal planning  Plan to eat 1 vegetarian meal one day each week. Try to work up to 2 vegetarian meals, if possible. Eat seafood 2 or more times a week. Have healthy snacks readily available, such as: Vegetable sticks with hummus. Greek yogurt. Fruit and nut trail mix. Eat balanced meals throughout the week. This includes: Fruit: 2-3 servings a day Vegetables: 4-5 servings a day Low-fat dairy: 2 servings a day Fish, poultry, or lean meat: 1 serving a day Beans and legumes: 2 or more servings a week Nuts and seeds: 1-2 servings a day Whole grains: 6-8 servings a day Extra-virgin olive oil: 3-4 servings a day Limit red meat and sweets to only a few servings a month What are my food choices? Mediterranean diet Recommended Grains: Whole-grain pasta. Brown rice. Bulgar wheat. Polenta. Couscous. Whole-wheat bread. [place]. Vegetables: Artichokes. Beets. Broccoli. Cabbage. Carrots. Eggplant. Green beans. Chard. Kale. Spinach. Onions. Leeks. Peas. Squash. Tomatoes. Peppers. Radishes. Fruits: Apples. Apricots. Avocado. Berries. Bananas. Cherries. Dates. Figs. Grapes. Lemons. Melon. Oranges. Peaches. Plums. Pomegranate. Meats and other protein foods: Beans. Almonds. Sunflower seeds. Pine nuts. Peanuts. [place]. Salmon. Scallops. Shrimp. [place]. Tilapia. Clams. Oysters. Eggs. Dairy: Low-fat milk. Cheese. Greek yogurt. Beverages: Water. Red wine. Herbal tea. Fats and oils: Extra virgin olive oil. Avocado oil. Grape seed oil. Sweets and desserts: [place] yogurt with honey. Baked apples. Poached pears. Trail mix. Seasoning and other foods: Basil. Cilantro. Coriander. Cumin. Mint. Parsley. Sage. Rosemary. Tarragon. Garlic. Oregano. Thyme. Pepper.  Balsalmic vinegar. Tahini. Hummus. Tomato sauce. Olives. Mushrooms. Limit these Grains: Prepackaged pasta or rice dishes. Prepackaged cereal with added sugar. Vegetables: Deep fried potatoes (french fries). Fruits: Fruit canned in syrup. Meats and other protein foods: Beef. Pork. Lamb. Poultry with skin. Hot dogs. [place] Salines. Dairy: Ice cream. Sour cream. Whole milk. Beverages: Juice. Sugar-sweetened soft drinks. Beer. Liquor and spirits. Fats and oils: Butter. Canola oil. Vegetable oil. Beef fat (tallow). Lard. Sweets and desserts: Cookies. Cakes. Pies. Candy. Seasoning and other foods: Mayonnaise. Premade sauces and marinades. The items listed may not be a complete list. Talk with your dietitian about what dietary choices are right for you. Summary The Mediterranean diet includes both food and lifestyle choices. Eat a variety of fresh fruits and vegetables, beans, nuts, seeds, and whole grains. Limit the amount of red meat and sweets that you eat. Talk with your health care provider about whether it is safe for you to drink red wine in moderation. This means 1 glass a day for nonpregnant women and 2 glasses a day for men. A glass of wine equals 5 oz (150 mL). This information is not intended to replace advice given to you by your health care provider. Make sure you discuss any questions you have with your health care provider. Document Released: 01/08/2016 Document Revised: 02/10/2016 Document Reviewed: 01/08/2016 Elsevier Interactive Patient Education  2017 [place]  provider has requested that you have labwork completed today. Please go to [facility] ([id number]) on the second floor of this building before leaving the office today. You do not need to check in. If you are not called within 15 minutes please check with the front desk.

## 2021-07-12 NOTE — Progress Notes (Signed)
Please inform the patient that her vitamin B12 and thyroid levels are normal.  Thank you

## 2021-07-28 DIAGNOSIS — L304 Erythema intertrigo: Secondary | ICD-10-CM | POA: Diagnosis not present

## 2021-07-28 DIAGNOSIS — L821 Other seborrheic keratosis: Secondary | ICD-10-CM | POA: Diagnosis not present

## 2021-07-28 DIAGNOSIS — D225 Melanocytic nevi of trunk: Secondary | ICD-10-CM | POA: Diagnosis not present

## 2021-07-28 DIAGNOSIS — Z08 Encounter for follow-up examination after completed treatment for malignant neoplasm: Secondary | ICD-10-CM | POA: Diagnosis not present

## 2021-07-28 DIAGNOSIS — Z85828 Personal history of other malignant neoplasm of skin: Secondary | ICD-10-CM | POA: Diagnosis not present

## 2021-07-28 DIAGNOSIS — D485 Neoplasm of uncertain behavior of skin: Secondary | ICD-10-CM | POA: Diagnosis not present

## 2021-07-28 DIAGNOSIS — L814 Other melanin hyperpigmentation: Secondary | ICD-10-CM | POA: Diagnosis not present

## 2021-07-30 ENCOUNTER — Encounter: Payer: Self-pay | Admitting: Registered Nurse

## 2021-07-30 ENCOUNTER — Other Ambulatory Visit: Payer: Self-pay

## 2021-07-30 ENCOUNTER — Encounter: Payer: Medicare Other | Attending: Physical Medicine & Rehabilitation | Admitting: Registered Nurse

## 2021-07-30 VITALS — BP 150/70 | HR 75 | Ht 66.0 in | Wt 205.2 lb

## 2021-07-30 DIAGNOSIS — M62838 Other muscle spasm: Secondary | ICD-10-CM | POA: Diagnosis not present

## 2021-07-30 DIAGNOSIS — G894 Chronic pain syndrome: Secondary | ICD-10-CM | POA: Insufficient documentation

## 2021-07-30 DIAGNOSIS — M170 Bilateral primary osteoarthritis of knee: Secondary | ICD-10-CM | POA: Diagnosis not present

## 2021-07-30 DIAGNOSIS — M2550 Pain in unspecified joint: Secondary | ICD-10-CM | POA: Diagnosis not present

## 2021-07-30 DIAGNOSIS — R202 Paresthesia of skin: Secondary | ICD-10-CM | POA: Insufficient documentation

## 2021-07-30 DIAGNOSIS — Z79899 Other long term (current) drug therapy: Secondary | ICD-10-CM | POA: Insufficient documentation

## 2021-07-30 DIAGNOSIS — Z5181 Encounter for therapeutic drug level monitoring: Secondary | ICD-10-CM | POA: Insufficient documentation

## 2021-07-30 DIAGNOSIS — S32000S Wedge compression fracture of unspecified lumbar vertebra, sequela: Secondary | ICD-10-CM | POA: Insufficient documentation

## 2021-07-30 DIAGNOSIS — M961 Postlaminectomy syndrome, not elsewhere classified: Secondary | ICD-10-CM | POA: Diagnosis not present

## 2021-07-30 DIAGNOSIS — M7062 Trochanteric bursitis, left hip: Secondary | ICD-10-CM | POA: Diagnosis not present

## 2021-07-30 DIAGNOSIS — M7061 Trochanteric bursitis, right hip: Secondary | ICD-10-CM | POA: Insufficient documentation

## 2021-07-30 DIAGNOSIS — R2990 Unspecified symptoms and signs involving the nervous system: Secondary | ICD-10-CM

## 2021-07-30 MED ORDER — HYDROCODONE-ACETAMINOPHEN 10-325 MG PO TABS: 1.0000 | ORAL_TABLET | Freq: Four times a day (QID) | ORAL | 0 refills | Status: DC | PRN

## 2021-07-30 NOTE — Progress Notes (Signed)
Subjective:    Patient ID: Crystal Lee, female    DOB: 1934-02-02, 88 y.o.   MRN: [id number]  HPI: Crystal Lee is a 88 y.o. female who returns for follow up appointment for chronic pain and medication refill. She states her  pain is located in her mid- lower back, bilateral hips and bilateral lower extremities. She also reports generalized joint pain. She rates her pain 5. Her current exercise regime is walking and performing stretching exercises.  Crystal Lee equivalent is 40.00 MME.   Last Oral Swab was Performed on 05/14/2021, it was consistent.     Pain Inventory Average Pain 5 Pain Right Now 5 My pain is sharp, burning, tingling, and aching  In the last 24 hours, has pain interfered with the following? General activity 5 Relation with others 3 Enjoyment of life 5 What TIME of day is your pain at its worst? evening Sleep (in general) Fair  Pain is worse with: walking, bending, standing, and some activites Pain improves with: rest, heat/ice, and medication Relief from Meds: 3  Family History  Problem Relation Age of Onset   Other Mother        complications from flu   Other Father        unsure of cause   Colon cancer Neg Hx    Social History   Socioeconomic History   Marital status: Widowed    Spouse name: Not on file   Number of children: 3   Years of education: 16 years   Highest education level: Not on file  Occupational History   Occupation: Retired  Tobacco Use   Smoking status: Former    Packs/day: 0.50    Years: 20.00    Pack years: 10.00    Types: Cigarettes    Quit date: 02/03/1976    Years since quitting: 45.5   Smokeless tobacco: Never  Vaping Use   Vaping Use: Never used  Substance and Sexual Activity   Alcohol use: No   Drug use: No   Sexual activity: Not Currently    Birth control/protection: Post-menopausal  Other Topics Concern   Not on file  Social History Narrative   Lives at home with husband.   Right-handed.   No  caffeine use.   Social Determinants of Health   Financial Resource Strain: Not on file  Food Insecurity: Not on file  Transportation Needs: Not on file  Physical Activity: Not on file  Stress: Not on file  Social Connections: Not on file   Past Surgical History:  Procedure Laterality Date   52 HOUR [place] STUDY N/A 06/21/2018   Procedure: 24 HOUR [place];  Surgeon: Lavena Bullion, DO;  [facility]: [facility];  Service: Gastroenterology;  Laterality: N/A;  with impedance   ABDOMINAL HYSTERECTOMY  2010   ANAL RECTAL MANOMETRY N/A 09/19/2019   Procedure: ANO RECTAL MANOMETRY;  Surgeon: Mauri Pole, MD;  [facility]: [facility];  Service: Endoscopy;  Laterality: N/A;   [place]   BIOPSY  06/10/2020   Procedure: BIOPSY;  Surgeon: Lavena Bullion, DO;  [facility];  Service: Gastroenterology;;  EGD and COLON   BREAST SURGERY     CATARACT EXTRACTION Bilateral 2011   CHOLECYSTECTOMY     COLON RESECTION N/A 06/12/2020   Procedure: LAPAROSCOPIC COLON RESECTION;  Surgeon: [name], MD;  [facility]: [facility];  Service: General;  Laterality: N/A;   COLONOSCOPY WITH PROPOFOL N/A 06/10/2020   Procedure: COLONOSCOPY  WITH PROPOFOL;  Surgeon: Lavena Bullion, DO;  [facility];  Service: Gastroenterology;  Laterality: N/A;   ESOPHAGEAL MANOMETRY N/A 06/21/2018   Procedure: ESOPHAGEAL MANOMETRY (EM);  Surgeon: Lavena Bullion, DO;  [facility];  Service: Gastroenterology;  Laterality: N/A;   ESOPHAGOGASTRODUODENOSCOPY (EGD) WITH PROPOFOL N/A 06/10/2020   Procedure: ESOPHAGOGASTRODUODENOSCOPY (EGD) WITH PROPOFOL;  Surgeon: Lavena Bullion, DO;  [facility];  Service: Gastroenterology;  Laterality: N/A;   GALLBLADDER SURGERY  2015   JOINT REPLACEMENT     RT TOTAL HIP / RT TOTAL KNEE   MASTECTOMY  1998   BILATERAL    [place] IMPEDANCE STUDY  06/21/2018   Procedure: [place] IMPEDANCE STUDY;  Surgeon: Lavena Bullion,  DO;  [facility];  Service: Gastroenterology;;   POLYPECTOMY  06/10/2020   Procedure: POLYPECTOMY;  Surgeon: Lavena Bullion, DO;  [facility];  Service: Gastroenterology;;   SKIN CANCER EXCISION  2016   RT SIDE OF NOSE   SUBMUCOSAL TATTOO INJECTION  06/10/2020   Procedure: SUBMUCOSAL TATTOO INJECTION;  Surgeon: Lavena Bullion, DO;  [facility];  Service: Gastroenterology;;   [place]  2010   RIGHT   TOTAL HIP ARTHROPLASTY Left 05/09/2015   Procedure: LEFT TOTAL HIP ARTHROPLASTY ANTERIOR APPROACH;  Surgeon: Gaynelle Arabian, MD;  [facility];  Service: Orthopedics;  Laterality: Left;   TOTAL KNEE ARTHROPLASTY  2001   TUMOR REMOVAL  2012   ABDOMINAL - NON CANCEROUS   Past Surgical History:  Procedure Laterality Date   27 HOUR [place] STUDY N/A 06/21/2018   Procedure: 24 HOUR [place] STUDY;  Surgeon: Lavena Bullion, DO;  [facility];  Service: Gastroenterology;  Laterality: N/A;  with impedance   ABDOMINAL HYSTERECTOMY  2010   ANAL RECTAL MANOMETRY N/A 09/19/2019   Procedure: ANO RECTAL MANOMETRY;  Surgeon: Mauri Pole, MD;  [facility];  Service: Endoscopy;  Laterality: N/A;   [place]   BIOPSY  06/10/2020   Procedure: BIOPSY;  Surgeon: Lavena Bullion, DO;  [facility];  Service: Gastroenterology;;  EGD and COLON   BREAST SURGERY     CATARACT EXTRACTION Bilateral 2011   CHOLECYSTECTOMY     COLON RESECTION N/A 06/12/2020   Procedure: LAPAROSCOPIC COLON RESECTION;  Surgeon: [name], MD;  [facility];  Service: General;  Laterality: N/A;   COLONOSCOPY WITH PROPOFOL N/A 06/10/2020   Procedure: COLONOSCOPY WITH PROPOFOL;  Surgeon: Lavena Bullion, DO;  [facility];  Service: Gastroenterology;  Laterality: N/A;   ESOPHAGEAL MANOMETRY N/A 06/21/2018   Procedure: ESOPHAGEAL MANOMETRY (EM);  Surgeon: Lavena Bullion, DO;   [facility];  Service: Gastroenterology;  Laterality: N/A;   ESOPHAGOGASTRODUODENOSCOPY (EGD) WITH PROPOFOL N/A 06/10/2020   Procedure: ESOPHAGOGASTRODUODENOSCOPY (EGD) WITH PROPOFOL;  Surgeon: Lavena Bullion, DO;  [facility];  Service: Gastroenterology;  Laterality: N/A;   GALLBLADDER SURGERY  2015   JOINT REPLACEMENT     RT TOTAL HIP / RT TOTAL KNEE   MASTECTOMY  1998   BILATERAL    [place] IMPEDANCE STUDY  06/21/2018   Procedure: [place] IMPEDANCE STUDY;  Surgeon: Lavena Bullion, DO;  [facility];  Service: Gastroenterology;;   POLYPECTOMY  06/10/2020   Procedure: POLYPECTOMY;  Surgeon: Lavena Bullion, DO;  [facility];  Service: Gastroenterology;;   SKIN CANCER EXCISION  2016   RT SIDE OF NOSE  SUBMUCOSAL TATTOO INJECTION  06/10/2020   Procedure: SUBMUCOSAL TATTOO INJECTION;  Surgeon: Lavena Bullion, DO;  [facility];  Service: Gastroenterology;;   [place]  2010   RIGHT   TOTAL HIP ARTHROPLASTY Left 05/09/2015   Procedure: LEFT TOTAL HIP ARTHROPLASTY ANTERIOR APPROACH;  Surgeon: Gaynelle Arabian, MD;  [facility];  Service: Orthopedics;  Laterality: Left;   TOTAL KNEE ARTHROPLASTY  2001   TUMOR REMOVAL  2012   ABDOMINAL - NON CANCEROUS   Past Medical History:  Diagnosis Date   Arthritis    Cancer ([place])    HX BREAST CANCER/ SKIN CANCER   Colon cancer ([place])    Complication of anesthesia    N/V WITH Lee   Difficulty sleeping    Fractured hip ([place])    LEFT - AUG 2016   GERD (gastroesophageal reflux disease)    Hyperlipidemia    Hypertension    Melanoma ([place])    Neuropathy    Nocturia    Osteopenia    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   PONV (postoperative nausea and vomiting)    PT STATES Lee CAUSED N/V   Rosacea    Sleep apnea    Stage 1 breast cancer, ER+, right ([place]) 07/04/2017   BP (!) 150/70    Pulse 75    Ht 5\' 6"  (1.676 m)    Wt 205 lb  3.2 oz (93.1 kg)    SpO2 97%    BMI 33.12 kg/m   Opioid Risk Score:   Fall Risk Score:  `1  Depression screen PHQ 2/9  Depression screen [facility] 2/9 07/30/2021 06/02/2021 05/14/2021 04/01/2021 02/04/2021 01/06/2021 01/06/2021  Decreased Interest 0 0 0 0 0 0 0  Down, Depressed, Hopeless 0 0 0 0 0 - 0  PHQ - 2 Score 0 0 0 0 0 0 0  Some recent data might be hidden     Review of Systems  Constitutional: Negative.   HENT: Negative.    Eyes: Negative.   Respiratory: Negative.    Cardiovascular: Negative.   Gastrointestinal: Negative.   Endocrine: Negative.   Genitourinary: Negative.   Musculoskeletal:  Positive for back pain.  Skin: Negative.   Allergic/Immunologic: Negative.   Neurological: Negative.   Hematological: Negative.   Psychiatric/Behavioral: Negative.        Objective:   Physical Exam Vitals and nursing note reviewed.  Constitutional:      Appearance: Normal appearance.  Cardiovascular:     Rate and Rhythm: Normal rate and regular rhythm.     Pulses: Normal pulses.     Heart sounds: Normal heart sounds.  Pulmonary:     Effort: Pulmonary effort is normal.     Breath sounds: Normal breath sounds.  Musculoskeletal:     Cervical back: Normal range of motion and neck supple.     Comments: Normal Muscle Bulk and Muscle Testing Reveals:  Upper Extremities: Full ROM and Muscle Strength 5/5 Thoracic, Paraspinal Tenderness: T-7-T-10 Lumbar Paraspinal Tenderness: L-3-L-5 Bilateral Greater Trochanter Tenderness Lower Extremities: Full ROM and Muscle Strength 5/5 Arises from chair slowly using walker for support Narrow Based  Gait     Skin:    General: Skin is warm and dry.  Neurological:     Mental Status: She is alert and oriented to person, place, and time.  Psychiatric:        Mood and Affect: Mood normal.        Behavior: Behavior normal.  Assessment & Plan:  1. Chronic Bilateral Leg pain: Paresthesia: Continue to Monitor. 07/30/2021 2. Paresthesia Doreene Burke  Radiculitis: Crystal Lee has weaned herself off the Lyrica due to daytime drowsiness. We will continue to monitor. 07/30/2021 3. Pain of Left Wrist/ : No complaints today. S/P Carpal Tunnel Release on 06/03/2017 by Dr. Ellene Route. Dr. Ellene Route Following. 07/30/2021. 4. Fracture of superior pubic ramus.  Dr. Wynelle Link Following.  Continue to monitor. 07/30/2021. 5. Bilateral Knee OA: Continue Voltaren Gel.  Continue to monitor. Orthopedist following. 07/30/2021 6. Polyarthralgia: Continue to alternate with heat and ice therapy. Continue current medication regime. Continue to monitor. 07/30/2021. 7. Chronic Pain Syndrome: Refilled::Hydrocodone 10/325 mg one tablet every 6 hours as needed for moderate pain #120. 07/30/2021. 8. Lumbar Compression Fracture L2 and L3:  Crystal Lee refused physical therapy. Continue with rest/ heat therapy. Continue to Monitor 07/30/2021. 9. Muscle Spasm: Continue  Flexeril 5 mg at HS. Continue to monitor. 07/30/2021. 10. Right Shoulder Tendonitis: No complaints today. Continue HEP as Tolerated. Alternate Ice and Heat Therapy. 07/30/2021  11. Greater Trochanter Bursitis: Continue to Monitor. Continue HEP as Tolerated. 07/30/2021   F/U in 1 Month

## 2021-08-04 ENCOUNTER — Ambulatory Visit: Payer: Medicare Other | Admitting: Neurology

## 2021-08-04 ENCOUNTER — Other Ambulatory Visit: Payer: Self-pay | Admitting: Surgery

## 2021-08-04 DIAGNOSIS — K432 Incisional hernia without obstruction or gangrene: Secondary | ICD-10-CM | POA: Diagnosis not present

## 2021-08-07 ENCOUNTER — Ambulatory Visit (INDEPENDENT_AMBULATORY_CARE_PROVIDER_SITE_OTHER): Payer: Medicare Other

## 2021-08-07 VITALS — HR 84 | Temp 97.9°F | Resp 16 | Ht 66.0 in | Wt 201.8 lb

## 2021-08-07 DIAGNOSIS — Z0000 Encounter for general adult medical examination without abnormal findings: Secondary | ICD-10-CM

## 2021-08-07 NOTE — Patient Instructions (Signed)
Crystal Lee , Thank you for taking time to come for your Medicare Wellness Visit. I appreciate your ongoing commitment to your health goals. Please review the following plan we discussed and let me know if I can assist you in the future.   Screening recommendations/referrals: Colonoscopy: 06/18/21 due 06/18/24 Mammogram: no longer needed Bone Density: ordered 05/05/21 Recommended yearly ophthalmology/optometry visit for glaucoma screening and checkup Recommended yearly dental visit for hygiene and checkup  Vaccinations: Influenza vaccine: up to date Pneumococcal vaccine: up to date Tdap vaccine: up to date Shingles vaccine: Due-May obtain vaccine at your local pharmacy.    Covid-19:Due-May obtain vaccine at your local pharmacy.   Advanced directives: No, declined giving a copy  Conditions/risks identified: see problem list   Next appointment: Follow up in one year for your annual wellness visit    Preventive Care 65 Years and Older, Female Preventive care refers to lifestyle choices and visits with your health care provider that can promote health and wellness. What does preventive care include? A yearly physical exam. This is also called an annual well check. Dental exams once or twice a year. Routine eye exams. Ask your health care provider how often you should have your eyes checked. Personal lifestyle choices, including: Daily care of your teeth and gums. Regular physical activity. Eating a healthy diet. Avoiding tobacco and drug use. Limiting alcohol use. Practicing safe sex. Taking low-dose aspirin every day. Taking vitamin and mineral supplements as recommended by your health care provider. What happens during an annual well check? The services and screenings done by your health care provider during your annual well check will depend on your age, overall health, lifestyle risk factors, and family history of disease. Counseling  Your health care provider may ask you  questions about your: Alcohol use. Tobacco use. Drug use. Emotional well-being. Home and relationship well-being. Sexual activity. Eating habits. History of falls. Memory and ability to understand (cognition). Work and work Statistician. Reproductive health. Screening  You may have the following tests or measurements: Height, weight, and BMI. Blood pressure. Lipid and cholesterol levels. These may be checked every 5 years, or more frequently if you are over 28 years old. Skin check. Lung cancer screening. You may have this screening every year starting at age 71 if you have a 30-pack-year history of smoking and currently smoke or have quit within the past 15 years. Fecal occult blood test (FOBT) of the stool. You may have this test every year starting at age 31. Flexible sigmoidoscopy or colonoscopy. You may have a sigmoidoscopy every 5 years or a colonoscopy every 10 years starting at age 61. Hepatitis C blood test. Hepatitis B blood test. Sexually transmitted disease (STD) testing. Diabetes screening. This is done by checking your blood sugar (glucose) after you have not eaten for a while (fasting). You may have this done every 1-3 years. Bone density scan. This is done to screen for osteoporosis. You may have this done starting at age 87. Mammogram. This may be done every 1-2 years. Talk to your health care provider about how often you should have regular mammograms. Talk with your health care provider about your test results, treatment options, and if necessary, the need for more tests. Vaccines  Your health care provider may recommend certain vaccines, such as: Influenza vaccine. This is recommended every year. Tetanus, diphtheria, and acellular pertussis (Tdap, Td) vaccine. You may need a Td booster every 10 years. Zoster vaccine. You may need this after age 10. Pneumococcal 13-valent conjugate (  PCV13) vaccine. One dose is recommended after age 58. Pneumococcal polysaccharide  (PPSV23) vaccine. One dose is recommended after age 53. Talk to your health care provider about which screenings and vaccines you need and how often you need them. This information is not intended to replace advice given to you by your health care provider. Make sure you discuss any questions you have with your health care provider. Document Released: 06/13/2015 Document Revised: 02/04/2016 Document Reviewed: 03/18/2015 Elsevier Interactive Patient Education  2017 [place] Prevention in the Home Falls can cause injuries. They can happen to people of all ages. There are many things you can do to make your home safe and to help prevent falls. What can I do on the outside of my home? Regularly fix the edges of walkways and driveways and fix any cracks. Remove anything that might make you trip as you walk through a door, such as a raised step or threshold. Trim any bushes or trees on the path to your home. Use bright outdoor lighting. Clear any walking paths of anything that might make someone trip, such as rocks or tools. Regularly check to see if handrails are loose or broken. Make sure that both sides of any steps have handrails. Any raised decks and porches should have guardrails on the edges. Have any leaves, snow, or ice cleared regularly. Use sand or salt on walking paths during winter. Clean up any spills in your garage right away. This includes oil or grease spills. What can I do in the bathroom? Use night lights. Install grab bars by the toilet and in the tub and shower. Do not use towel bars as grab bars. Use non-skid mats or decals in the tub or shower. If you need to sit down in the shower, use a plastic, non-slip stool. Keep the floor dry. Clean up any water that spills on the floor as soon as it happens. Remove soap buildup in the tub or shower regularly. Attach bath mats securely with double-sided non-slip rug tape. Do not have throw rugs and other things on the  floor that can make you trip. What can I do in the bedroom? Use night lights. Make sure that you have a light by your bed that is easy to reach. Do not use any sheets or blankets that are too big for your bed. They should not hang down onto the floor. Have a firm chair that has side arms. You can use this for support while you get dressed. Do not have throw rugs and other things on the floor that can make you trip. What can I do in the kitchen? Clean up any spills right away. Avoid walking on wet floors. Keep items that you use a lot in easy-to-reach places. If you need to reach something above you, use a strong step stool that has a grab bar. Keep electrical cords out of the way. Do not use floor polish or wax that makes floors slippery. If you must use wax, use non-skid floor wax. Do not have throw rugs and other things on the floor that can make you trip. What can I do with my stairs? Do not leave any items on the stairs. Make sure that there are handrails on both sides of the stairs and use them. Fix handrails that are broken or loose. Make sure that handrails are as long as the stairways. Check any carpeting to make sure that it is firmly attached to the stairs. Fix any carpet that is  loose or worn. Avoid having throw rugs at the top or bottom of the stairs. If you do have throw rugs, attach them to the floor with carpet tape. Make sure that you have a light switch at the top of the stairs and the bottom of the stairs. If you do not have them, ask someone to add them for you. What else can I do to help prevent falls? Wear shoes that: Do not have high heels. Have rubber bottoms. Are comfortable and fit you well. Are closed at the toe. Do not wear sandals. If you use a stepladder: Make sure that it is fully opened. Do not climb a closed stepladder. Make sure that both sides of the stepladder are locked into place. Ask someone to hold it for you, if possible. Clearly mark and make  sure that you can see: Any grab bars or handrails. First and last steps. Where the edge of each step is. Use tools that help you move around (mobility aids) if they are needed. These include: Canes. Walkers. Scooters. Crutches. Turn on the lights when you go into a dark area. Replace any light bulbs as soon as they burn out. Set up your furniture so you have a clear path. Avoid moving your furniture around. If any of your floors are uneven, fix them. If there are any pets around you, be aware of where they are. Review your medicines with your doctor. Some medicines can make you feel dizzy. This can increase your chance of falling. Ask your doctor what other things that you can do to help prevent falls. This information is not intended to replace advice given to you by your health care provider. Make sure you discuss any questions you have with your health care provider. Document Released: 03/13/2009 Document Revised: 10/23/2015 Document Reviewed: 06/21/2014 Elsevier Interactive Patient Education  2017 [employer].

## 2021-08-07 NOTE — Progress Notes (Signed)
Subjective:   Crystal Lee is a 88 y.o. female who presents for an Initial Medicare Annual Wellness Visit.  Review of Systems     Cardiac Risk Factors include: advanced age (>39mn, >>51women);dyslipidemia;hypertension     Objective:    Today's Vitals   08/07/21 1524 08/07/21 1533  Pulse: 84   Resp: 16   Temp: 97.9 F (36.6 C)   SpO2: 96%   Weight: 201 lb 12.8 oz (91.5 kg)   Height: '5\' 6"'$  (1.676 m)   PainSc:  4    Body mass index is 32.57 kg/m.  Advanced Directives 07/10/2021 06/29/2021 03/25/2021 01/06/2021 12/02/2020 10/16/2020 09/25/2020  Does Patient Have a Medical Advance Directive? Yes Yes Yes Yes Yes Yes Yes  Type of Advance Directive - - Healthcare Power of [place] Living will;Healthcare Power of [place] will  Does patient want to make changes to medical advance directive? - - - No - Patient declined No - Patient declined No - Patient declined -  Copy of [place]? - - - No - copy requested - No - copy requested No - copy requested  Would patient like information on creating a medical advance directive? - - - - - - -    Current Medications (verified) Outpatient Encounter Medications as of 08/07/2021  Medication Sig   atorvastatin (LIPITOR) 20 MG tablet Take 1 tablet (20 mg total) by mouth daily.   cyclobenzaprine (FLEXERIL) 5 MG tablet Take 1 tablet (5 mg total) by mouth at bedtime as needed for muscle spasms.   donepezil (ARICEPT) 10 MG tablet Take half tablet (5 mg) daily for 2 weeks, then increase to the full tablet at 10 mg daily   estradiol (ESTRACE VAGINAL) 0.1 MG/GM vaginal cream Apply 1gm vaginally 1-3x a week as needed for comfort (Patient taking differently: Place 1 g vaginally See admin instructions. 1g vaginally once to three times a week as needed for comfort with UTI)   HYDROcodone-acetaminophen (NORCO) 10-325 MG  tablet Take 1 tablet by mouth every 6 (six) hours as needed.   losartan (COZAAR) 25 MG tablet Take 1 tablet (25 mg total) by mouth daily.   Multiple Vitamins-Minerals (MULTIVITAMIN ADULTS) TABS Take 1 tablet by mouth daily.   nitrofurantoin, macrocrystal-monohydrate, (MACROBID) 100 MG capsule Take 1 capsule (100 mg total) by mouth daily.   phenazopyridine (PYRIDIUM) 200 MG tablet Take 1 tablet (200 mg total) by mouth 3 (three) times daily as needed for pain.   aspirin EC 81 MG tablet Take 81 mg by mouth daily. Swallow whole. (Patient not taking: Reported on 08/07/2021)   [DISCONTINUED] fenofibrate (TRICOR) 48 MG tablet Take 48 mg by mouth daily.   [DISCONTINUED] phenazopyridine (PYRIDIUM) 95 MG tablet Take 1 tablet (95 mg total) by mouth 3 (three) times daily as needed for pain.   No facility-administered encounter medications on file as of 08/07/2021.    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   PONV (postoperative nausea and vomiting)    PT STATES MORPHINE CAUSED N/V   Rosacea    Sleep apnea    Stage 1 breast cancer, ER+, right ([place]) 07/04/2017   Past Surgical History:  Procedure Laterality Date   6 HOUR [place] STUDY N/A 06/21/2018   Procedure: 24 HOUR [place] STUDY;  Surgeon: Lavena Bullion, DO;  [facility];  Service: Gastroenterology;  Laterality: N/A;  with impedance   ABDOMINAL HYSTERECTOMY  2010   ANAL RECTAL MANOMETRY N/A 09/19/2019   Procedure: ANO RECTAL MANOMETRY;  Surgeon: Mauri Pole, MD;  [facility];  Service: Endoscopy;  Laterality: N/A;   [place]   BIOPSY  06/10/2020   Procedure: BIOPSY;  Surgeon: Lavena Bullion, DO;  [facility];  Service: Gastroenterology;;  EGD and COLON   BREAST SURGERY     CATARACT EXTRACTION Bilateral 2011   CHOLECYSTECTOMY     COLON RESECTION N/A 06/12/2020   Procedure: LAPAROSCOPIC COLON RESECTION;  Surgeon: [name], MD;  [facility];  Service: General;  Laterality: N/A;   COLONOSCOPY WITH PROPOFOL N/A 06/10/2020   Procedure: COLONOSCOPY WITH PROPOFOL;  Surgeon: Lavena Bullion, DO;  [facility];  Service: Gastroenterology;  Laterality: N/A;   ESOPHAGEAL MANOMETRY N/A 06/21/2018   Procedure: ESOPHAGEAL MANOMETRY (EM);  Surgeon: Lavena Bullion, DO;  [facility];  Service: Gastroenterology;  Laterality: N/A;   ESOPHAGOGASTRODUODENOSCOPY (EGD) WITH PROPOFOL N/A 06/10/2020   Procedure: ESOPHAGOGASTRODUODENOSCOPY (EGD) WITH PROPOFOL;  Surgeon: Lavena Bullion, DO;  [facility];  Service: Gastroenterology;  Laterality: N/A;   GALLBLADDER SURGERY  2015   JOINT REPLACEMENT     RT TOTAL HIP / RT TOTAL KNEE   MASTECTOMY  1998   BILATERAL    [place] IMPEDANCE STUDY  06/21/2018   Procedure: [place] IMPEDANCE STUDY;  Surgeon: Lavena Bullion, DO;  [facility];  Service: Gastroenterology;;   POLYPECTOMY  06/10/2020   Procedure: POLYPECTOMY;  Surgeon: Lavena Bullion, DO;  [facility];  Service: Gastroenterology;;   SKIN CANCER EXCISION  2016   RT SIDE OF NOSE   SUBMUCOSAL TATTOO INJECTION  06/10/2020   Procedure: SUBMUCOSAL TATTOO INJECTION;  Surgeon: Lavena Bullion, DO;  [facility];  Service: Gastroenterology;;   [place]  2010   RIGHT   TOTAL HIP ARTHROPLASTY Left 05/09/2015   Procedure: LEFT TOTAL HIP ARTHROPLASTY ANTERIOR APPROACH;  Surgeon: Gaynelle Arabian, MD;   [facility];  Service: Orthopedics;  Laterality: Left;   TOTAL KNEE ARTHROPLASTY  2001   TUMOR REMOVAL  2012   ABDOMINAL - NON CANCEROUS   Family History  Problem Relation Age of Onset   Other Mother        complications from flu   Other Father        unsure of cause   Colon cancer Neg Hx    Social History   Socioeconomic History   Marital status: Widowed    Spouse name: Not on file   Number of children: 3   Years of education: 16 years   Highest education level: Not on file  Occupational History   Occupation: Retired  Tobacco Use   Smoking status: Former    Packs/day: 0.50    Years: 20.00    Pack years:  10.00    Types: Cigarettes    Quit date: 02/03/1976    Years since quitting: 45.5   Smokeless tobacco: Never  Vaping Use   Vaping Use: Never used  Substance and Sexual Activity   Alcohol use: No   Drug use: No   Sexual activity: Not Currently    Birth control/protection: Post-menopausal  Other Topics Concern   Not on file  Social History Narrative   Lives at home with husband.   Right-handed.   No caffeine use.   Social Determinants of Health   Financial Resource Strain: Not on file  Food Insecurity: Not on file  Transportation Needs: Not on file  Physical Activity: Not on file  Stress: Not on file  Social Connections: Not on file    Tobacco Counseling Counseling given: Not Answered   Clinical Intake:  Pre-visit preparation completed: Yes  Pain : 0-10 Pain Score: 4  Pain Type: Chronic pain Pain Location: Other (Comment) Pain Descriptors / Indicators: Aching, Throbbing Pain Onset: More than a month ago Pain Frequency: Constant     BMI - recorded: 32.57 Nutritional Status: BMI > 30  Obese Nutritional Risks: None Diabetes: No  How often do you need to have someone help you when you read instructions, pamphlets, or other written materials from your doctor or pharmacy?: 1 - Never  Diabetic?No  Interpreter Needed?: No  Information  entered by :: [place] of Daily Living In your present state of health, do you have any difficulty performing the following activities: 08/07/2021 03/25/2021  Hearing? Y N  Vision? Y N  Difficulty concentrating or making decisions? N N  Walking or climbing stairs? Y N  Comment walk with walker -  Dressing or bathing? N N  Doing errands, shopping? N N  Preparing Food and eating ? N -  Using the Toilet? N -  In the past six months, have you accidently leaked urine? Y -  Do you have problems with loss of bowel control? N -  Managing your Medications? N -  Housekeeping or managing your Housekeeping? N -  Some recent data might be hidden    Patient Care Team: Copland, Gay Filler, MD as PCP - General (Family Medicine) [name], MD as Medical Oncologist (Oncology) Jaquita Folds, MD as Consulting Physician (Obstetrics and Gynecology)  Indicate any recent Medical Services you may have received from other than [facility] providers in the past year (date may be approximate).     Assessment:   This is a routine wellness examination for [place].  Hearing/Vision screen No results found.  Dietary issues and exercise activities discussed: Current Exercise Habits: Home exercise routine, Type of exercise: walking, Time (Minutes): 60, Frequency (Times/Week): 7, Weekly Exercise (Minutes/Week): 420, Intensity: Mild, Exercise limited by: None identified   Goals Addressed   None    Depression Screen PHQ 2/9 Scores 08/07/2021 07/30/2021 06/02/2021 05/14/2021 04/01/2021 02/04/2021 01/06/2021  PHQ - 2 Score 0 0 0 0 0 0 0  PHQ- 9 Score - - - - - - -    Fall Risk Fall Risk  08/07/2021 07/30/2021 07/10/2021 06/02/2021 05/14/2021  Falls in the past year? 0 0 0 0 0  Number falls in past yr: 0 0 0 0 0  Injury with Fall? 0 - 0 0 0  Risk for fall due to : No Fall Risks - - - -  Follow up Falls evaluation completed - - - -    FALL RISK PREVENTION PERTAINING TO  THE HOME:  Any stairs in  or around the home? Yes  If so, are there any without handrails? No  Home free of loose throw rugs in walkways, pet beds, electrical cords, etc? Yes  Adequate lighting in your home to reduce risk of falls? Yes   ASSISTIVE DEVICES UTILIZED TO PREVENT FALLS:  Life alert? No  Use of a cane, walker or w/c? Yes  Grab bars in the bathroom? Yes  Shower chair or bench in shower? Yes  Elevated toilet seat or a handicapped toilet? Yes   TIMED UP AND GO:  Was the test performed? Yes .  Length of time to ambulate 10 feet: 15 sec.   Gait slow and steady with assistive device  Cognitive Function:   Montreal Cognitive Assessment  05/05/2021  Visuospatial/ Executive (0/5) 2  Naming (0/3) 3  Attention: Read list of digits (0/2) 2  Attention: Read list of letters (0/1) 1  Attention: Serial 7 subtraction starting at 100 (0/3) 1  Language: Repeat phrase (0/2) 1  Language : Fluency (0/1) 0  Abstraction (0/2) 2  Delayed Recall (0/5) 0  Orientation (0/6) 6  Total 18   6CIT Screen 08/07/2021  What Year? 0 points  What month? 0 points  What time? 0 points  Count back from 20 0 points  Months in reverse 0 points  Repeat phrase 0 points  Total Score 0    Immunizations Immunization History  Administered Date(s) Administered   Fluad Quad(high Dose 65+) 06/29/2019, 04/01/2021   Influenza, High Dose Seasonal PF 02/02/2018   Influenza,inj,Quad PF,6+ Mos 07/17/2020   Influenza,inj,quad, With Preservative 05/07/2017   Moderna SARS-COV2 Booster Vaccination 09/18/2020   Moderna Sars-Covid-2 Vaccination 07/27/2019, 08/24/2019   Pneumococcal Conjugate-13 02/07/2014   Pneumococcal Polysaccharide-23 03/21/2002   Tdap 02/09/2016    TDAP status: Up to date  Flu Vaccine status: Up to date  Pneumococcal vaccine status: Up to date  Covid-19 vaccine status: Declined, Education has been provided regarding the importance of this vaccine but patient still declined. Advised may receive this vaccine at  local pharmacy or Health Dept.or vaccine clinic. Aware to provide a copy of the vaccination record if obtained from local pharmacy or Health Dept. Verbalized acceptance and understanding.  Qualifies for Shingles Vaccine? Yes   Zostavax completed No   Shingrix Completed?: No.    Education has been provided regarding the importance of this vaccine. Patient has been advised to call insurance company to determine out of pocket expense if they have not yet received this vaccine. Advised may also receive vaccine at local pharmacy or Health Dept. Verbalized acceptance and understanding.  Screening Tests Health Maintenance  Topic Date Due   Zoster Vaccines- Shingrix (1 of 2) Never done   COVID-19 Vaccine (3 - Moderna risk series) 10/16/2020   COLONOSCOPY (Pts 45-63yr Insurance coverage will need to be confirmed)  06/18/2024   TETANUS/TDAP  02/08/2026   Pneumonia Vaccine 88 Years old  Completed   INFLUENZA VACCINE  Completed   DEXA SCAN  Completed   HPV VACCINES  Aged Out    Health Maintenance  Health Maintenance Due  Topic Date Due   Zoster Vaccines- Shingrix (1 of 2) Never done   COVID-19 Vaccine (3 - Moderna risk series) 10/16/2020    Colorectal cancer screening: Type of screening: Colonoscopy. Completed 06/18/21. Repeat every 3 years  Mammogram status: No longer required due to no breast.  Bone Density status: Ordered 05/05/21. Pt provided with contact info and advised to call to schedule  appt.  Lung Cancer Screening: (Low Dose CT Chest recommended if Age 48-80 years, 30 pack-year currently smoking OR have quit w/in 15years.) does not qualify.   Lung Cancer Screening Referral: N/A  Additional Screening:  Hepatitis C Screening: does not qualify; Completed Aged out  Vision Screening: Recommended annual ophthalmology exams for early detection of glaucoma and other disorders of the eye. Is the patient up to date with their annual eye exam?  Yes Who is the provider or what is the  name of the office in which the patient attends annual eye exams? Dr.Maqum If pt is not established with a provider, would they like to be referred to a provider to establish care? No .   Dental Screening: Recommended annual dental exams for proper oral hygiene  Community Resource Referral / Chronic Care Management: CRR required this visit?  No   CCM required this visit?  No      Plan:     I have personally reviewed and noted the following in the patients chart:   Medical and social history Use of alcohol, tobacco or illicit drugs  Current medications and supplements including opioid prescriptions. Patient is currently taking opioid prescriptions. Information provided to patient regarding non-opioid alternatives. Patient advised to discuss non-opioid treatment plan with their provider. Functional ability and status Nutritional status Physical activity Advanced directives List of other physicians Hospitalizations, surgeries, and ER visits in previous 12 months Vitals Screenings to include cognitive, depression, and falls Referrals and appointments  In addition, I have reviewed and discussed with patient certain preventive protocols, quality metrics, and best practice recommendations. A written personalized care plan for preventive services as well as general preventive health recommendations were provided to patient.     Duard Brady Dawid Dupriest, CMA   08/07/2021   Nurse Notes: None

## 2021-08-20 DIAGNOSIS — H01113 Allergic dermatitis of right eye, unspecified eyelid: Secondary | ICD-10-CM | POA: Diagnosis not present

## 2021-08-20 DIAGNOSIS — H01116 Allergic dermatitis of left eye, unspecified eyelid: Secondary | ICD-10-CM | POA: Diagnosis not present

## 2021-08-20 DIAGNOSIS — H16203 Unspecified keratoconjunctivitis, bilateral: Secondary | ICD-10-CM | POA: Diagnosis not present

## 2021-08-28 DIAGNOSIS — Z20822 Contact with and (suspected) exposure to covid-19: Secondary | ICD-10-CM | POA: Diagnosis not present

## 2021-08-30 DIAGNOSIS — Z20822 Contact with and (suspected) exposure to covid-19: Secondary | ICD-10-CM | POA: Diagnosis not present

## 2021-09-01 ENCOUNTER — Encounter: Payer: Self-pay | Admitting: Registered Nurse

## 2021-09-01 ENCOUNTER — Encounter: Payer: Medicare Other | Attending: Physical Medicine & Rehabilitation | Admitting: Registered Nurse

## 2021-09-01 VITALS — BP 151/72 | HR 66 | Ht 66.0 in | Wt 206.0 lb

## 2021-09-01 DIAGNOSIS — M546 Pain in thoracic spine: Secondary | ICD-10-CM | POA: Insufficient documentation

## 2021-09-01 DIAGNOSIS — M2550 Pain in unspecified joint: Secondary | ICD-10-CM | POA: Insufficient documentation

## 2021-09-01 DIAGNOSIS — R2990 Unspecified symptoms and signs involving the nervous system: Secondary | ICD-10-CM

## 2021-09-01 DIAGNOSIS — G894 Chronic pain syndrome: Secondary | ICD-10-CM | POA: Insufficient documentation

## 2021-09-01 DIAGNOSIS — S32000S Wedge compression fracture of unspecified lumbar vertebra, sequela: Secondary | ICD-10-CM | POA: Insufficient documentation

## 2021-09-01 DIAGNOSIS — G8929 Other chronic pain: Secondary | ICD-10-CM | POA: Insufficient documentation

## 2021-09-01 DIAGNOSIS — Z79899 Other long term (current) drug therapy: Secondary | ICD-10-CM | POA: Diagnosis not present

## 2021-09-01 DIAGNOSIS — M961 Postlaminectomy syndrome, not elsewhere classified: Secondary | ICD-10-CM | POA: Insufficient documentation

## 2021-09-01 DIAGNOSIS — R202 Paresthesia of skin: Secondary | ICD-10-CM | POA: Diagnosis not present

## 2021-09-01 DIAGNOSIS — M170 Bilateral primary osteoarthritis of knee: Secondary | ICD-10-CM | POA: Diagnosis not present

## 2021-09-01 DIAGNOSIS — Z5181 Encounter for therapeutic drug level monitoring: Secondary | ICD-10-CM | POA: Insufficient documentation

## 2021-09-01 MED ORDER — CYCLOBENZAPRINE HCL 5 MG PO TABS: 5.0000 mg | ORAL_TABLET | Freq: Every evening | ORAL | 2 refills | Status: DC | PRN

## 2021-09-01 MED ORDER — HYDROCODONE-ACETAMINOPHEN 10-325 MG PO TABS: 1.0000 | ORAL_TABLET | Freq: Four times a day (QID) | ORAL | 0 refills | Status: DC | PRN

## 2021-09-01 NOTE — Progress Notes (Signed)
? ?Subjective:  ? ? Patient ID: Crystal Lee, female    DOB: 15-Apr-1934, 88 y.o.   MRN: [id number] ? ?HPI: Crystal Lee is a 88 y.o. female who returns for follow up appointment for chronic pain and medication refill. She states her pain is located in her mid- lower back and bilateral knee pain. She also reports generalized pain. She  rates her pain 5. Her current exercise regime is walking and performing stretching exercises. ?  ?Crystal Lee Morphine equivalent is 40.00 MME.   Oral Swab was Performed today.  ? ?Pain Inventory ?Average Pain 5 ?Pain Right Now 5 ?My pain is sharp, burning, tingling, and aching ? ?In the last 24 hours, has pain interfered with the following? ?General activity 5 ?Relation with others 3 ?Enjoyment of life 5 ?What TIME of day is your pain at its worst? evening ?Sleep (in general) Fair ? ?Pain is worse with: walking, bending, standing, and some activites ?Pain improves with: rest, heat/ice, and medication ?Relief from Meds: 2 ? ?Family History  ?Problem Relation Age of Onset  ? Other Mother   ?     complications from flu  ? Other Father   ?     unsure of cause  ? Colon cancer Neg Hx   ? ?Social History  ? ?Socioeconomic History  ? Marital status: Widowed  ?  Spouse name: Not on file  ? Number of children: 3  ? Years of education: 16 years  ? Highest education level: Not on file  ?Occupational History  ? Occupation: Retired  ?Tobacco Use  ? Smoking status: Former  ?  Packs/day: 0.50  ?  Years: 20.00  ?  Pack years: 10.00  ?  Types: Cigarettes  ?  Quit date: 02/03/1976  ?  Years since quitting: 45.6  ? Smokeless tobacco: Never  ?Vaping Use  ? Vaping Use: Never used  ?Substance and Sexual Activity  ? Alcohol use: No  ? Drug use: No  ? Sexual activity: Not Currently  ?  Birth control/protection: Post-menopausal  ?Other Topics Concern  ? Not on file  ?Social History Narrative  ? Lives at home with husband.  ? Right-handed.  ? No caffeine use.  ? ?Social Determinants of Health  ? ?Financial  Resource Strain: Low Risk   ? Difficulty of Paying Living Expenses: Not hard at all  ?Food Insecurity: No Food Insecurity  ? Worried About Charity fundraiser in the Last Year: Never true  ? Ran Out of Food in the Last Year: Never true  ?Transportation Needs: No Transportation Needs  ? Lack of Transportation (Medical): No  ? Lack of Transportation (Non-Medical): No  ?Physical Activity: Not on file  ?Stress: Not on file  ?Social Connections: Moderately Isolated  ? Frequency of Communication with Friends and Family: More than three times a week  ? Frequency of Social Gatherings with Friends and Family: More than three times a week  ? Attends Religious Services: More than 4 times per year  ? Active Member of Clubs or Organizations: No  ? Attends Archivist Meetings: Never  ? Marital Status: Widowed  ? ?Past Surgical History:  ?Procedure Laterality Date  ? 29 HOUR [place] STUDY N/A 06/21/2018  ? Procedure: [place] STUDY;  Surgeon: Lavena Bullion, DO;  [facility];  Service: Gastroenterology;  Laterality: N/A;  with impedance  ? ABDOMINAL HYSTERECTOMY  2010  ? ANAL RECTAL MANOMETRY N/A 09/19/2019  ? Procedure: ANO RECTAL MANOMETRY;  Surgeon: Silverio Decamp,  Venia Minks, MD;  Location[facility];  Service: Endoscopy;  Laterality: N/A;  ? APPENDECTOMY  1949  ? [place]  ? BIOPSY  06/10/2020  ? Procedure: BIOPSY;  Surgeon: Lavena Bullion, DO;  [facility];  Service: Gastroenterology;;  EGD and COLON  ? BREAST SURGERY    ? CATARACT EXTRACTION Bilateral 2011  ? CHOLECYSTECTOMY    ? COLON RESECTION N/A 06/12/2020  ? Procedure: LAPAROSCOPIC COLON RESECTION;  Surgeon: [name], MD;  [facility];  Service: General;  Laterality: N/A;  ? COLONOSCOPY WITH PROPOFOL N/A 06/10/2020  ? Procedure: COLONOSCOPY WITH PROPOFOL;  Surgeon: Lavena Bullion, DO;  [facility];  Service: Gastroenterology;  Laterality: N/A;  ? ESOPHAGEAL MANOMETRY N/A 06/21/2018  ? Procedure:  ESOPHAGEAL MANOMETRY (EM);  Surgeon: Lavena Bullion, DO;  [facility];  Service: Gastroenterology;  Laterality: N/A;  ? ESOPHAGOGASTRODUODENOSCOPY (EGD) WITH PROPOFOL N/A 06/10/2020  ? Procedure: ESOPHAGOGASTRODUODENOSCOPY (EGD) WITH PROPOFOL;  Surgeon: Lavena Bullion, DO;  [facility];  Service: Gastroenterology;  Laterality: N/A;  ? GALLBLADDER SURGERY  2015  ? JOINT REPLACEMENT    ? RT TOTAL HIP / RT TOTAL KNEE  ? MASTECTOMY  1998  ? BILATERAL   ? [place] IMPEDANCE STUDY  06/21/2018  ? Procedure: [place] IMPEDANCE STUDY;  Surgeon: Lavena Bullion, DO;  [facility];  Service: Gastroenterology;;  ? POLYPECTOMY  06/10/2020  ? Procedure: POLYPECTOMY;  Surgeon: Lavena Bullion, DO;  [facility];  Service: Gastroenterology;;  ? SKIN CANCER EXCISION  2016  ? RT SIDE OF NOSE  ? SUBMUCOSAL TATTOO INJECTION  06/10/2020  ? Procedure: SUBMUCOSAL TATTOO INJECTION;  Surgeon: Lavena Bullion, DO;  [facility];  Service: Gastroenterology;;  ? TONSILLECTOMY  1953  ? TOTAL HIP ARTHROPLASTY  2010  ? RIGHT  ? TOTAL HIP ARTHROPLASTY Left 05/09/2015  ? Procedure: LEFT TOTAL HIP ARTHROPLASTY ANTERIOR APPROACH;  Surgeon: Gaynelle Arabian, MD;  [facility];  Service: Orthopedics;  Laterality: Left;  ? TOTAL KNEE ARTHROPLASTY  2001  ? TUMOR REMOVAL  2012  ? ABDOMINAL - NON CANCEROUS  ? ?Past Surgical History:  ?Procedure Laterality Date  ? 83 HOUR [place] STUDY N/A 06/21/2018  ? Procedure: [place] STUDY;  Surgeon: Lavena Bullion, DO;  [facility];  Service: Gastroenterology;  Laterality: N/A;  with impedance  ? ABDOMINAL HYSTERECTOMY  2010  ? ANAL RECTAL MANOMETRY N/A 09/19/2019  ? Procedure: ANO RECTAL MANOMETRY;  Surgeon: Mauri Pole, MD;  [facility];  Service: Endoscopy;  Laterality: N/A;  ? APPENDECTOMY  1949  ? [place]  ? BIOPSY  06/10/2020  ? Procedure: BIOPSY;  Surgeon: Lavena Bullion, DO;  [facility];  Service:  Gastroenterology;;  EGD and COLON  ? BREAST SURGERY    ? CATARACT EXTRACTION Bilateral 2011  ? CHOLECYSTECTOMY    ? COLON RESECTION N/A 06/12/2020  ? Procedure: LAPAROSCOPIC COLON RESECTION;  Surgeon: [name], MD;  [facility];  Service: General;  Laterality: N/A;  ? COLONOSCOPY WITH PROPOFOL N/A 06/10/2020  ? Procedure: COLONOSCOPY WITH PROPOFOL;  Surgeon: Lavena Bullion, DO;  [facility];  Service: Gastroenterology;  Laterality: N/A;  ? ESOPHAGEAL MANOMETRY N/A 06/21/2018  ? Procedure: ESOPHAGEAL MANOMETRY (EM);  Surgeon: Lavena Bullion, DO;  [facility];  Service: Gastroenterology;  Laterality: N/A;  ? ESOPHAGOGASTRODUODENOSCOPY (EGD) WITH PROPOFOL N/A 06/10/2020  ? Procedure: ESOPHAGOGASTRODUODENOSCOPY (EGD) WITH PROPOFOL;  Surgeon: Bryan Lemma,  [name], DO;  [facility];  Service: Gastroenterology;  Laterality: N/A;  ? GALLBLADDER SURGERY  2015  ? JOINT REPLACEMENT    ? RT TOTAL HIP / RT TOTAL KNEE  ? MASTECTOMY  1998  ? BILATERAL   ? [place] IMPEDANCE STUDY  06/21/2018  ? Procedure: [place] IMPEDANCE STUDY;  Surgeon: Lavena Bullion, DO;  [facility];  Service: Gastroenterology;;  ? POLYPECTOMY  06/10/2020  ? Procedure: POLYPECTOMY;  Surgeon: Lavena Bullion, DO;  [facility];  Service: Gastroenterology;;  ? SKIN CANCER EXCISION  2016  ? RT SIDE OF NOSE  ? SUBMUCOSAL TATTOO INJECTION  06/10/2020  ? Procedure: SUBMUCOSAL TATTOO INJECTION;  Surgeon: Lavena Bullion, DO;  [facility];  Service: Gastroenterology;;  ? TONSILLECTOMY  1953  ? TOTAL HIP ARTHROPLASTY  2010  ? RIGHT  ? TOTAL HIP ARTHROPLASTY Left 05/09/2015  ? Procedure: LEFT TOTAL HIP ARTHROPLASTY ANTERIOR APPROACH;  Surgeon: Gaynelle Arabian, MD;  [facility];  Service: Orthopedics;  Laterality: Left;  ? TOTAL KNEE ARTHROPLASTY  2001  ? TUMOR REMOVAL  2012  ? ABDOMINAL - NON CANCEROUS  ? ?Past Medical History:  ?Diagnosis Date  ? Arthritis   ? Cancer [facility])   ? HX BREAST  CANCER/ SKIN CANCER  ? Colon cancer ([place])   ? Complication of anesthesia   ? N/V WITH MORPHINE  ? Difficulty sleeping   ? Fractured hip ([place])   ? LEFT - AUG 2016  ? GERD (gastroesophageal reflux disease)   ? Hyperlipidemia

## 2021-09-03 DIAGNOSIS — H16103 Unspecified superficial keratitis, bilateral: Secondary | ICD-10-CM | POA: Diagnosis not present

## 2021-09-03 DIAGNOSIS — H04123 Dry eye syndrome of bilateral lacrimal glands: Secondary | ICD-10-CM | POA: Diagnosis not present

## 2021-09-03 DIAGNOSIS — H1820 Unspecified corneal edema: Secondary | ICD-10-CM | POA: Diagnosis not present

## 2021-09-09 NOTE — Progress Notes (Signed)
Surgical Instructions ? ? ? Your procedure is scheduled on 09/16/21. ? Report to [facility] Main Entrance "A" at 6:30 A.M., then check in with the Admitting office. ? Call this number if you have problems the morning of surgery: ? [phone] ? ? If you have any questions prior to your surgery date call [phone]: Open Monday-Friday 8am-4pm ? ? ? Remember: ? Do not eat after midnight the night before your surgery ? ?You may drink clear liquids until 5:30 the morning of your surgery.   ?Clear liquids allowed are: Water, Non-Citrus Juices (without pulp), Carbonated Beverages, Clear Tea, Black Coffee ONLY (NO MILK, CREAM OR POWDERED CREAMER of any kind), and Gatorade ? ?Please complete your PRE-SURGERY ENSURE that was provided to you by 5:30 the morning of surgery.  Please, if able, drink it in one setting. DO NOT SIP.  ?  ? Take these medicines the morning of surgery with A SIP OF WATER:  ?atorvastatin (LIPITOR) ?donepezil (ARICEPT) ?nitrofurantoin, macrocrystal-monohydrate, (MACROBID) ? ?AS NEEDED:  ?HYDROcodone-acetaminophen ([place])  ?phenazopyridine (PYRIDIUM)  ? ? ?As of today, STOP taking any Aspirin (unless otherwise instructed by your surgeon) Aleve, Naproxen, Ibuprofen, Motrin, Advil, Goody's, BC's, all herbal medications, fish oil, and all vitamins. ? ?         ?Do not wear jewelry or makeup ?Do not wear lotions, powders, perfumes or deodorant. ?Do not shave 48 hours prior to surgery.   ?Do not bring valuables to the hospital. ?Do not wear nail polish, gel polish, artificial nails, or any other type of covering on natural nails (fingers and toes) ?If you have artificial nails or gel coating that need to be removed by a nail salon, please have this removed prior to surgery. Artificial nails or gel coating may interfere with anesthesia's ability to adequately monitor your vital signs. ? ?[facility] is not responsible for any belongings or valuables. .  ? ?Do NOT Smoke (Tobacco/Vaping)  24 hours prior to  your procedure ? ?If you use a CPAP at night, you may bring your mask for your overnight stay. ?  ?Contacts, glasses, hearing aids, dentures or partials may not be worn into surgery, please bring cases for these belongings ?  ?For patients admitted to the hospital, discharge time will be determined by your treatment team. ?  ?Patients discharged the day of surgery will not be allowed to drive home, and someone needs to stay with them for 24 hours. ? ? ?SURGICAL WAITING ROOM VISITATION ?Patients having surgery or a procedure in a hospital may have two support people. ?Children under the age of 48 must have an adult with them who is not the patient. ?They may stay in the waiting area during the procedure and may switch out with other visitors. If the patient needs to stay at the hospital during part of their recovery, the visitor guidelines for inpatient rooms apply. ? ?Please refer to the [facility] website for the visitor guidelines for Inpatients (after your surgery is over and you are in a regular room).  ? ? ? ?Special instructions:   ? ?Oral Hygiene is also important to reduce your risk of infection.  Remember - BRUSH YOUR TEETH THE MORNING OF SURGERY WITH YOUR REGULAR TOOTHPASTE ? ? ?[facility]- Preparing For Surgery ? ?Before surgery, you can play an important role. Because skin is not sterile, your skin needs to be as free of germs as possible. You can reduce the number of germs on your skin by washing with CHG (chlorahexidine gluconate)  Soap before surgery.  CHG is an antiseptic cleaner which kills germs and bonds with the skin to continue killing germs even after washing.   ? ? ?Please do not use if you have an allergy to CHG or antibacterial soaps. If your skin becomes reddened/irritated stop using the CHG.  ?Do not shave (including legs and underarms) for at least 48 hours prior to first CHG shower. It is OK to shave your face. ? ?Please follow these instructions carefully. ?  ? ? Shower the NIGHT BEFORE  SURGERY and the MORNING OF SURGERY with CHG Soap.  ? If you chose to wash your hair, wash your hair first as usual with your normal shampoo. After you shampoo, rinse your hair and body thoroughly to remove the shampoo.  Then [employer] and genitals (private parts) with your normal soap and rinse thoroughly to remove soap. ? ?After that Use CHG Soap as you would any other liquid soap. You can apply CHG directly to the skin and wash gently with a scrungie or a clean washcloth.  ? ?Apply the CHG Soap to your body ONLY FROM THE NECK DOWN.  Do not use on open wounds or open sores. Avoid contact with your eyes, ears, mouth and genitals (private parts). Wash Face and genitals (private parts)  with your normal soap.  ? ?Wash thoroughly, paying special attention to the area where your surgery will be performed. ? ?Thoroughly rinse your body with warm water from the neck down. ? ?DO NOT shower/wash with your normal soap after using and rinsing off the CHG Soap. ? ?Pat yourself dry with a CLEAN TOWEL. ? ?Wear CLEAN PAJAMAS to bed the night before surgery ? ?Place CLEAN SHEETS on your bed the night before your surgery ? ?DO NOT SLEEP WITH PETS. ? ? ?Day of Surgery: ? ?Take a shower with CHG soap. ?Wear Clean/Comfortable clothing the morning of surgery ?Do not apply any deodorants/lotions.   ?Remember to brush your teeth WITH YOUR REGULAR TOOTHPASTE. ? ? ? ?If you received a COVID test during your pre-op visit  it is requested that you wear a mask when out in public, stay away from anyone that may not be feeling well and notify your surgeon if you develop symptoms. If you have been in contact with anyone that has tested positive in the last 10 days please notify you surgeon. ? ?  ?Please read over the following fact sheets that you were given.   ?

## 2021-09-10 ENCOUNTER — Other Ambulatory Visit: Payer: Self-pay

## 2021-09-10 ENCOUNTER — Encounter (HOSPITAL_COMMUNITY): Payer: Self-pay

## 2021-09-10 ENCOUNTER — Encounter (HOSPITAL_COMMUNITY)
Admission: RE | Admit: 2021-09-10 | Discharge: 2021-09-10 | Disposition: A | Discharge status: Home / Self Care | Payer: Medicare Other | Source: Ambulatory Visit | Attending: Surgery | Admitting: Surgery

## 2021-09-10 VITALS — BP 154/65 | HR 91 | Temp 97.9°F | Resp 18 | Ht 66.0 in | Wt 205.1 lb

## 2021-09-10 DIAGNOSIS — Z01818 Encounter for other preprocedural examination: Secondary | ICD-10-CM

## 2021-09-10 DIAGNOSIS — Z01812 Encounter for preprocedural laboratory examination: Secondary | ICD-10-CM | POA: Diagnosis not present

## 2021-09-10 LAB — BASIC METABOLIC PANEL
Anion gap: 3 — ABNORMAL LOW (ref 5–15)
BUN: 11 mg/dL (ref 8–23)
CO2: 29 mmol/L (ref 22–32)
Calcium: 9.3 mg/dL (ref 8.9–10.3)
Chloride: 106 mmol/L (ref 98–111)
Creatinine, Ser: 0.62 mg/dL (ref 0.44–1.00)
GFR, Estimated: >60 mL/min (ref >60)
Glucose, Bld: 101 mg/dL — ABNORMAL HIGH (ref 70–99)
Potassium: 4.1 mmol/L (ref 3.5–5.1)
Sodium: 138 mmol/L (ref 135–145)

## 2021-09-10 LAB — CBC
HCT: 41.1 % (ref 36.0–46.0)
Hemoglobin: 13 g/dL (ref 12.0–15.0)
MCH: 29.4 pg (ref 26.0–34.0)
MCHC: 31.6 g/dL (ref 30.0–36.0)
MCV: 93 fL (ref 80.0–100.0)
Platelets: 136 10*3/uL — ABNORMAL LOW (ref 150–400)
RBC: 4.42 MIL/uL (ref 3.87–5.11)
RDW: 13.1 % (ref 11.5–15.5)
WBC: 6.2 10*3/uL (ref 4.0–10.5)
nRBC: 0 % (ref 0.0–0.2)

## 2021-09-10 NOTE — Progress Notes (Addendum)
PCP: Lamar Blinks, MD ?Cardiologist:  Shirlee More, MD ? ?EKG:  03/27/21 ?CXR:  06/08/20 ?ECHO:  03/26/21 ?Stress Test:  denies ?Cardiac Cath:  denies ? ?Fasting Blood Sugar-  na ?Checks Blood Sugar___na  times a day ? ?OSA/CPAP: No ? ?ASA: Last dose was approx 2 weeks ago ?Blood Thinner: No ? ?Covid test not needed ? ?Anesthesia Review: Yes, abnormal EKG ? ?Patient denies shortness of breath, fever, cough, and chest pain at PAT appointment. ? ?Patient verbalized understanding of instructions provided today at the PAT appointment.  Patient asked to review instructions at home and day of surgery.   ?

## 2021-09-11 DIAGNOSIS — Z20828 Contact with and (suspected) exposure to other viral communicable diseases: Secondary | ICD-10-CM | POA: Diagnosis not present

## 2021-09-15 NOTE — Anesthesia Preprocedure Evaluation (Addendum)
Anesthesia Evaluation  ?Patient identified by MRN, date of birth, ID band ?Patient awake ? ? ? ?Reviewed: ?Allergy & Precautions, NPO status , Patient's Chart, lab work & pertinent test results ? ?History of Anesthesia Complications ?(+) PONV and history of anesthetic complications ? ?Airway ?Mallampati: II ? ?TM Distance: >3 FB ?Neck ROM: Full ? ? ? Dental ?no notable dental hx. ?(+) Dental Advisory Given ?  ?Pulmonary ?sleep apnea , former smoker,  ?  ?Pulmonary exam normal ? ? ? ? ? ? ? Cardiovascular ?hypertension, Pt. on medications ?Normal cardiovascular exam ? ?IMPRESSIONS ?? ?? ??1. Left ventricular ejection fraction, by estimation, is 55 to 60%. The left ventricle has normal function. The left ventricle has no regional wall motion abnormalities. Left ventricular diastolic parameters are indeterminate. ??2. Right ventricular systolic function is normal. The right ventricular size is normal. There is mildly elevated pulmonary artery systolic pressure. The estimated right ventricular systolic pressure is 99.3 mmHg. ??3. The mitral valve is normal in structure. Trivial mitral valve regurgitation. No evidence of mitral stenosis. ??4. The aortic valve is tricuspid. Aortic valve regurgitation is not visualized. Mild aortic valve sclerosis is present, with no evidence of aortic valve stenosis. ??5. The inferior vena cava is dilated in size with <50% respiratory variability, suggesting right atrial pressure of 15 mmHg. ?? ?  ?Neuro/Psych ? Neuromuscular disease   ? GI/Hepatic ?GERD  ,  ?Endo/Other  ? ? Renal/GU ?  ? ?  ?Musculoskeletal ? ?(+) Arthritis ,  ? Abdominal ?  ?Peds ? Hematology ?  ?Anesthesia Other Findings ? ? Reproductive/Obstetrics ? ?  ? ? ? ? ? ? ? ? ? ? ? ? ? ?  ?  ? ? ? ? ? ? ? ?Anesthesia Physical ? ?Anesthesia Plan ? ?ASA: 3 ? ?Anesthesia Plan: General  ? ?Post-op Pain Management: Celebrex PO (pre-op)* and Tylenol PO (pre-op)*  ? ?Induction: Intravenous ? ?PONV  Risk Score and Plan: 4 or greater and Ondansetron, Dexamethasone, Droperidol, Treatment may vary due to age or medical condition and Diphenhydramine ? ?Airway Management Planned: Oral ETT ? ?Additional Equipment: None ? ?Intra-op Plan:  ? ?Post-operative Plan: Extubation in OR ? ?Informed Consent: I have reviewed the patients History and Physical, chart, labs and discussed the procedure including the risks, benefits and alternatives for the proposed anesthesia with the patient or authorized representative who has indicated his/her understanding and acceptance.  ? ? ? ?Dental advisory given ? ?Plan Discussed with: Anesthesiologist and CRNA ? ?Anesthesia Plan Comments: (Echo: ? ??1. Left ventricular ejection fraction, by estimation, is 60 to 65%. The  ?left ventricle has normal function. The left ventricle has no regional  ?wall motion abnormalities. There is mild left ventricular hypertrophy.  ?Left ventricular diastolic parameters  ?were normal.  ??2. The mitral valve is normal in structure and function. No evidence of  ?mitral valve regurgitation. No evidence of mitral stenosis.  ??3. The aortic valve is normal in structure and function. Aortic valve  ?regurgitation is not visualized. No aortic stenosis is present.  ??4. There is mild dilatation of the ascending aorta measuring 38 mm.  ??5. The inferior vena cava is normal in size with greater than 50%  ?respiratory variability, suggesting right atrial pressure of 3 mmHg. )  ? ? ? ? ? ?Anesthesia Quick Evaluation ? ?

## 2021-09-15 NOTE — H&P (Signed)
?PROVIDER: Beverlee Nims, MD ? ?MRN: [id number] ?DOB: 1934-01-21 ? ?Subjective  ? ?Chief Complaint: Follow-up and Hernia ? ? ?History of Present Illness: ?Crystal Lee is a 88 y.o. female who is seen today for a long-term follow-up. 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 She developed an incisional hernia. She reports that she felt like this was secondary to nausea and vomiting that occurred after drinking Ensure when she was home. She has discomfort from the hernia regarding mild cramping discomfort but no other obstructive symptoms. ?She underwent a repeat colonoscopy earlier this year and 3 benign polyps were found. She is still followed closely by medical oncology. A surveillance CT scan performed on January 29 of this year showed no evidence of recurrent cancer but did demonstrate the large hernia containing colon and small bowel. She is walking with a walker and reports that she is doing well from a cardiopulmonary standpoint ? ? ? ?Review of Systems: ?A complete review of systems was obtained from the patient. I have reviewed this information and discussed as appropriate with the patient. See HPI as well for other ROS. ? ?ROS  ? ?Medical History: ?Past Medical History:  ?Diagnosis Date  ? Arthritis  ? History of cancer  ? Hypertension  ? ?There is no problem list on file for this patient. ? ?History reviewed. No pertinent surgical history.  ? ?Allergies  ?Allergen Reactions  ? Propoxyphene Nausea And Vomiting and Palpitations  ?Darvocet Causes Sweats ?Reaction(s): Unknown; Note: Reaction(s): Headache,Vomiting ? ? Adhesive Other (See Comments)  ? Lisinopril Cough  ?09/25/2020 ?Patient states she has never taken Lisinopril. ?Other reaction(s): Cough ?Lisinopril- cough ? ? Morphine Other (See Comments) and Nausea And Vomiting  ?Reaction(s): Unknown ? ? Tapentadol Rash  ? ?Current  Outpatient Medications on File Prior to Visit  ?Medication Sig Dispense Refill  ? atorvastatin (LIPITOR) 20 MG tablet atorvastatin 20 mg tablet  ? HYDROcodone-acetaminophen ([place]) 10-325 mg tablet  ? losartan (CO[place]) 25 MG tablet  ? donepeziL (ARICEPT) 10 MG tablet TAKE HALF TABLET (5 MG) DAILY FOR 2 WEEKS, THEN INCREASE TO THE FULL TABLET AT 10 MG DAILY  ? ?No current facility-administered medications on file prior to visit.  ? ?History reviewed. No pertinent family history.  ? ?Social History  ? ?Tobacco Use  ?Smoking Status Former  ? Types: Cigarettes  ? Quit date: 20  ? Years since quitting: 46.2  ?Smokeless Tobacco Never  ? ? ?Social History  ? ?Socioeconomic History  ? Marital status: Single  ?Tobacco Use  ? Smoking status: Former  ?Types: Cigarettes  ?Quit date: 2  ?Years since quitting: 46.2  ? Smokeless tobacco: Never  ?Substance and Sexual Activity  ? Alcohol use: Never  ? Drug use: Never  ? ?Objective:  ? ?Vitals:  ?  ?BP: 126/80  ?Pulse: 101  ?Temp: 36.8 ?C (98.2 ?F)  ?SpO2: 96%  ?Weight: 92.4 kg (203 lb 12.8 oz)  ?Height: 167.6 cm ('5\' 6"'$ )  ? ?Body mass index is 32.89 kg/m?. ? ?Physical Exam  ? ?She appears well on exam. ? ?Abdomen is soft and morbidly obese. There is a large, reducible, mildly tender incisional hernia. It is approximately 10 cm in size at least. ? ?Labs, Imaging and Diagnostic Testing: ? ?I have reviewed her CT scan as well as the notes from her medical oncologist ? ?Assessment and Plan:  ? ?Diagnoses and all orders  for this visit: ? ?Incisional hernia, without obstruction or gangrene ? ? ? ?She has a large incisional hernia with at least a 10 cm fascial defect. I discussed the diagnosis with her. As she is symptomatic she is requesting repair. We discussed both the open and laparoscopic techniques. I believe she will need an open repair with mesh given the size of the fascial defect as well as the amount of bowel involved in the hernia. We discussed the surgical procedure in  detail. We discussed hernias and abdominal wall anatomy. We discussed the risk of surgery which includes but is not limited to bleeding, infection, injury to surrounding structures, the use of mesh, hernia recurrence, cardiopulmonary issues, postoperative recovery, etc. She understands and wishes to proceed with surgery which will be scheduled.  ?

## 2021-09-16 ENCOUNTER — Other Ambulatory Visit: Payer: Self-pay

## 2021-09-16 ENCOUNTER — Encounter (HOSPITAL_COMMUNITY)
Admission: AD | Disposition: A | Discharge status: Home / Self Care | Payer: Self-pay | Source: Home / Self Care | Attending: Surgery

## 2021-09-16 ENCOUNTER — Inpatient Hospital Stay (HOSPITAL_COMMUNITY)
Admission: AD | Admit: 2021-09-16 | Discharge: 2021-09-21 | DRG: 354 | Disposition: A | Discharge status: Home / Self Care | Payer: Medicare Other | Attending: Surgery | Admitting: Surgery

## 2021-09-16 ENCOUNTER — Encounter (HOSPITAL_COMMUNITY): Payer: Self-pay | Admitting: Surgery

## 2021-09-16 ENCOUNTER — Ambulatory Visit (HOSPITAL_COMMUNITY): Payer: Medicare Other | Admitting: Anesthesiology

## 2021-09-16 ENCOUNTER — Ambulatory Visit (HOSPITAL_COMMUNITY): Payer: Medicare Other | Admitting: Physician Assistant

## 2021-09-16 DIAGNOSIS — K432 Incisional hernia without obstruction or gangrene: Principal | ICD-10-CM | POA: Diagnosis present

## 2021-09-16 DIAGNOSIS — G4730 Sleep apnea, unspecified: Secondary | ICD-10-CM | POA: Diagnosis not present

## 2021-09-16 DIAGNOSIS — Z9049 Acquired absence of other specified parts of digestive tract: Secondary | ICD-10-CM

## 2021-09-16 DIAGNOSIS — M1990 Unspecified osteoarthritis, unspecified site: Secondary | ICD-10-CM

## 2021-09-16 DIAGNOSIS — I10 Essential (primary) hypertension: Secondary | ICD-10-CM | POA: Diagnosis present

## 2021-09-16 DIAGNOSIS — Z888 Allergy status to other drugs, medicaments and biological substances status: Secondary | ICD-10-CM | POA: Diagnosis not present

## 2021-09-16 DIAGNOSIS — Z87891 Personal history of nicotine dependence: Secondary | ICD-10-CM | POA: Diagnosis not present

## 2021-09-16 DIAGNOSIS — Z79899 Other long term (current) drug therapy: Secondary | ICD-10-CM

## 2021-09-16 DIAGNOSIS — Z20822 Contact with and (suspected) exposure to covid-19: Secondary | ICD-10-CM | POA: Diagnosis not present

## 2021-09-16 DIAGNOSIS — K567 Ileus, unspecified: Secondary | ICD-10-CM | POA: Diagnosis not present

## 2021-09-16 DIAGNOSIS — C184 Malignant neoplasm of transverse colon: Secondary | ICD-10-CM | POA: Diagnosis present

## 2021-09-16 DIAGNOSIS — Z885 Allergy status to narcotic agent status: Secondary | ICD-10-CM

## 2021-09-16 HISTORY — PX: INCISIONAL HERNIA REPAIR: SHX193

## 2021-09-16 HISTORY — DX: Incisional hernia without obstruction or gangrene: K43.2

## 2021-09-16 SURGERY — REPAIR, HERNIA, INCISIONAL
Anesthesia: General

## 2021-09-16 MED ORDER — FENTANYL CITRATE (PF) 250 MCG/5ML IJ SOLN
INTRAMUSCULAR | Status: AC
  Filled 2021-09-16: qty 5

## 2021-09-16 MED ORDER — 0.9 % SODIUM CHLORIDE (POUR BTL) OPTIME
TOPICAL | Status: DC | PRN
  Administered 2021-09-16: 1000 mL

## 2021-09-16 MED ORDER — CEFAZOLIN SODIUM-DEXTROSE 2-4 GM/100ML-% IV SOLN
2.0000 g | INTRAVENOUS | Status: AC
  Administered 2021-09-16: 2 g via INTRAVENOUS
  Filled 2021-09-16: qty 100

## 2021-09-16 MED ORDER — ENOXAPARIN SODIUM 40 MG/0.4ML IJ SOSY
40.0000 mg | PREFILLED_SYRINGE | INTRAMUSCULAR | Status: DC
  Administered 2021-09-17 – 2021-09-21 (×5): 40 mg via SUBCUTANEOUS
  Filled 2021-09-16 (×5): qty 0.4

## 2021-09-16 MED ORDER — FENTANYL CITRATE (PF) 100 MCG/2ML IJ SOLN
INTRAMUSCULAR | Status: AC
  Filled 2021-09-16: qty 2

## 2021-09-16 MED ORDER — ONDANSETRON HCL 4 MG/2ML IJ SOLN
INTRAMUSCULAR | Status: AC
  Filled 2021-09-16: qty 2

## 2021-09-16 MED ORDER — ACETAMINOPHEN 500 MG PO TABS
1000.0000 mg | ORAL_TABLET | Freq: Four times a day (QID) | ORAL | Status: DC
  Administered 2021-09-16 – 2021-09-21 (×19): 1000 mg via ORAL
  Filled 2021-09-16 (×19): qty 2

## 2021-09-16 MED ORDER — FENTANYL CITRATE (PF) 100 MCG/2ML IJ SOLN
25.0000 ug | INTRAMUSCULAR | Status: AC | PRN
  Administered 2021-09-16 (×6): 25 ug via INTRAVENOUS

## 2021-09-16 MED ORDER — AMISULPRIDE (ANTIEMETIC) 5 MG/2ML IV SOLN
INTRAVENOUS | Status: AC
  Filled 2021-09-16: qty 4

## 2021-09-16 MED ORDER — ACETAMINOPHEN 500 MG PO TABS
1000.0000 mg | ORAL_TABLET | Freq: Once | ORAL | Status: AC
  Administered 2021-09-16: 1000 mg via ORAL
  Filled 2021-09-16: qty 2

## 2021-09-16 MED ORDER — TRAMADOL HCL 50 MG PO TABS
50.0000 mg | ORAL_TABLET | Freq: Four times a day (QID) | ORAL | Status: DC | PRN
  Administered 2021-09-19 – 2021-09-21 (×2): 50 mg via ORAL
  Filled 2021-09-16 (×3): qty 1

## 2021-09-16 MED ORDER — POTASSIUM CHLORIDE IN NACL 20-0.9 MEQ/L-% IV SOLN
INTRAVENOUS | Status: DC
  Filled 2021-09-16 (×2): qty 1000

## 2021-09-16 MED ORDER — NITROFURANTOIN MONOHYD MACRO 100 MG PO CAPS
100.0000 mg | ORAL_CAPSULE | Freq: Every day | ORAL | Status: DC
  Administered 2021-09-16 – 2021-09-21 (×6): 100 mg via ORAL
  Filled 2021-09-16 (×6): qty 1

## 2021-09-16 MED ORDER — DIPHENHYDRAMINE HCL 50 MG/ML IJ SOLN: 12.5000 mg | Freq: Four times a day (QID) | INTRAMUSCULAR | Status: DC | PRN

## 2021-09-16 MED ORDER — PROPOFOL 10 MG/ML IV BOLUS
INTRAVENOUS | Status: DC | PRN
  Administered 2021-09-16: 130 mg via INTRAVENOUS

## 2021-09-16 MED ORDER — DEXAMETHASONE SODIUM PHOSPHATE 10 MG/ML IJ SOLN
INTRAMUSCULAR | Status: DC | PRN
  Administered 2021-09-16: 5 mg via INTRAVENOUS

## 2021-09-16 MED ORDER — ONDANSETRON HCL 4 MG/2ML IJ SOLN
4.0000 mg | Freq: Four times a day (QID) | INTRAMUSCULAR | Status: DC | PRN
  Administered 2021-09-16 – 2021-09-19 (×7): 4 mg via INTRAVENOUS
  Filled 2021-09-16 (×7): qty 2

## 2021-09-16 MED ORDER — METHOCARBAMOL 500 MG PO TABS
500.0000 mg | ORAL_TABLET | Freq: Four times a day (QID) | ORAL | Status: DC | PRN
  Administered 2021-09-19 – 2021-09-21 (×5): 500 mg via ORAL
  Filled 2021-09-16 (×5): qty 1

## 2021-09-16 MED ORDER — LOSARTAN POTASSIUM 25 MG PO TABS
25.0000 mg | ORAL_TABLET | Freq: Every day | ORAL | Status: DC
  Administered 2021-09-17 – 2021-09-21 (×5): 25 mg via ORAL
  Filled 2021-09-16 (×5): qty 1

## 2021-09-16 MED ORDER — PROPOFOL 10 MG/ML IV BOLUS
INTRAVENOUS | Status: AC
  Filled 2021-09-16: qty 20

## 2021-09-16 MED ORDER — CHLORHEXIDINE GLUCONATE 0.12 % MT SOLN
OROMUCOSAL | Status: AC
  Administered 2021-09-16: 15 mL via OROMUCOSAL
  Filled 2021-09-16: qty 15

## 2021-09-16 MED ORDER — OXYCODONE HCL 5 MG PO TABS
5.0000 mg | ORAL_TABLET | ORAL | Status: DC | PRN
  Administered 2021-09-16 – 2021-09-21 (×14): 10 mg via ORAL
  Administered 2021-09-21: 5 mg via ORAL
  Administered 2021-09-21: 10 mg via ORAL
  Filled 2021-09-16 (×14): qty 2
  Filled 2021-09-16: qty 1
  Filled 2021-09-16 (×2): qty 2

## 2021-09-16 MED ORDER — DIPHENHYDRAMINE HCL 12.5 MG/5ML PO ELIX: 12.5000 mg | ORAL_SOLUTION | Freq: Four times a day (QID) | ORAL | Status: DC | PRN

## 2021-09-16 MED ORDER — CHLORHEXIDINE GLUCONATE 0.12 % MT SOLN: 15.0000 mL | Freq: Once | OROMUCOSAL | Status: AC

## 2021-09-16 MED ORDER — AMISULPRIDE (ANTIEMETIC) 5 MG/2ML IV SOLN
10.0000 mg | Freq: Once | INTRAVENOUS | Status: AC | PRN
  Administered 2021-09-16: 10 mg via INTRAVENOUS

## 2021-09-16 MED ORDER — ROCURONIUM BROMIDE 10 MG/ML (PF) SYRINGE
PREFILLED_SYRINGE | INTRAVENOUS | Status: AC
  Filled 2021-09-16: qty 10

## 2021-09-16 MED ORDER — LACTATED RINGERS IV SOLN: INTRAVENOUS | Status: DC

## 2021-09-16 MED ORDER — SUGAMMADEX SODIUM 200 MG/2ML IV SOLN
INTRAVENOUS | Status: DC | PRN
  Administered 2021-09-16: 200 mg via INTRAVENOUS

## 2021-09-16 MED ORDER — DONEPEZIL HCL 10 MG PO TABS
10.0000 mg | ORAL_TABLET | Freq: Every day | ORAL | Status: DC
  Administered 2021-09-16 – 2021-09-21 (×6): 10 mg via ORAL
  Filled 2021-09-16 (×6): qty 1

## 2021-09-16 MED ORDER — BUPIVACAINE-EPINEPHRINE 0.5% -1:200000 IJ SOLN
INTRAMUSCULAR | Status: DC | PRN
  Administered 2021-09-16: 20 mL

## 2021-09-16 MED ORDER — BUPIVACAINE-EPINEPHRINE 0.5% -1:200000 IJ SOLN
INTRAMUSCULAR | Status: AC
  Filled 2021-09-16: qty 1

## 2021-09-16 MED ORDER — ONDANSETRON 4 MG PO TBDP: 4.0000 mg | ORAL_TABLET | Freq: Four times a day (QID) | ORAL | Status: DC | PRN

## 2021-09-16 MED ORDER — FENTANYL CITRATE (PF) 100 MCG/2ML IJ SOLN
INTRAMUSCULAR | Status: DC | PRN
Start: 2021-09-16 — End: 2021-09-16
  Administered 2021-09-16 (×2): 25 ug via INTRAVENOUS
  Administered 2021-09-16 (×2): 50 ug via INTRAVENOUS

## 2021-09-16 MED ORDER — BUPIVACAINE-EPINEPHRINE (PF) 0.25% -1:200000 IJ SOLN
INTRAMUSCULAR | Status: AC
  Filled 2021-09-16: qty 30

## 2021-09-16 MED ORDER — LIDOCAINE 2% (20 MG/ML) 5 ML SYRINGE
INTRAMUSCULAR | Status: DC | PRN
  Administered 2021-09-16: 100 mg via INTRAVENOUS

## 2021-09-16 MED ORDER — ROCURONIUM BROMIDE 10 MG/ML (PF) SYRINGE
PREFILLED_SYRINGE | INTRAVENOUS | Status: DC | PRN
  Administered 2021-09-16: 80 mg via INTRAVENOUS
  Administered 2021-09-16: 5 mg via INTRAVENOUS
  Administered 2021-09-16: 10 mg via INTRAVENOUS

## 2021-09-16 MED ORDER — ORAL CARE MOUTH RINSE: 15.0000 mL | Freq: Once | OROMUCOSAL | Status: AC

## 2021-09-16 MED ORDER — CHLORHEXIDINE GLUCONATE CLOTH 2 % EX PADS: 6.0000 | MEDICATED_PAD | Freq: Once | CUTANEOUS | Status: DC

## 2021-09-16 MED ORDER — ONDANSETRON HCL 4 MG/2ML IJ SOLN
INTRAMUSCULAR | Status: DC | PRN
  Administered 2021-09-16: 4 mg via INTRAVENOUS

## 2021-09-16 MED ORDER — ACETAMINOPHEN 500 MG PO TABS: 1000.0000 mg | ORAL_TABLET | ORAL | Status: DC

## 2021-09-16 MED ORDER — DEXAMETHASONE SODIUM PHOSPHATE 10 MG/ML IJ SOLN
INTRAMUSCULAR | Status: AC
  Filled 2021-09-16: qty 1

## 2021-09-16 MED ORDER — HYDROMORPHONE HCL 1 MG/ML IJ SOLN
1.0000 mg | INTRAMUSCULAR | Status: DC | PRN
  Administered 2021-09-16 – 2021-09-18 (×9): 1 mg via INTRAVENOUS
  Administered 2021-09-19: 0.5 mg via INTRAVENOUS
  Administered 2021-09-19 – 2021-09-20 (×2): 1 mg via INTRAVENOUS
  Filled 2021-09-16 (×12): qty 1

## 2021-09-16 MED ORDER — CELECOXIB 200 MG PO CAPS
200.0000 mg | ORAL_CAPSULE | Freq: Once | ORAL | Status: AC
  Administered 2021-09-16: 200 mg via ORAL
  Filled 2021-09-16: qty 1

## 2021-09-16 SURGICAL SUPPLY — 33 items
BAG COUNTER SPONGE SURGICOUNT (BAG) ×2 IMPLANT
CANISTER SUCT 3000ML PPV (MISCELLANEOUS) ×2 IMPLANT
CHLORAPREP W/TINT 26 (MISCELLANEOUS) ×2 IMPLANT
COVER SURGICAL LIGHT HANDLE (MISCELLANEOUS) ×2 IMPLANT
DRAIN CHANNEL 19F RND (DRAIN) ×1 IMPLANT
DRAPE LAPAROSCOPIC ABDOMINAL (DRAPES) ×2 IMPLANT
DRSG OPSITE POSTOP 4X8 (GAUZE/BANDAGES/DRESSINGS) ×1 IMPLANT
ELECT REM PT RETURN 9FT ADLT (ELECTROSURGICAL) ×2
ELECTRODE REM PT RTRN 9FT ADLT (ELECTROSURGICAL) ×1 IMPLANT
EVACUATOR SILICONE 100CC (DRAIN) ×1 IMPLANT
GLOVE SURG SIGNA 7.5 PF LTX (GLOVE) ×2 IMPLANT
GOWN STRL REUS W/ TWL LRG LVL3 (GOWN DISPOSABLE) ×1 IMPLANT
GOWN STRL REUS W/ TWL XL LVL3 (GOWN DISPOSABLE) ×1 IMPLANT
GOWN STRL REUS W/TWL LRG LVL3 (GOWN DISPOSABLE) ×1
GOWN STRL REUS W/TWL XL LVL3 (GOWN DISPOSABLE) ×1
KIT BASIN OR (CUSTOM PROCEDURE TRAY) ×2 IMPLANT
KIT TURNOVER KIT B (KITS) ×2 IMPLANT
MESH VENTRALIGHT ST 10X13IN (Mesh General) ×1 IMPLANT
NDL HYPO 25GX1X1/2 BEV (NEEDLE) ×1 IMPLANT
NEEDLE HYPO 25GX1X1/2 BEV (NEEDLE) ×2 IMPLANT
NS IRRIG 1000ML POUR BTL (IV SOLUTION) ×2 IMPLANT
PACK GENERAL/GYN (CUSTOM PROCEDURE TRAY) ×2 IMPLANT
PAD ARMBOARD 7.5X6 YLW CONV (MISCELLANEOUS) ×2 IMPLANT
PENCIL SMOKE EVACUATOR (MISCELLANEOUS) ×2 IMPLANT
STAPLER VISISTAT 35W (STAPLE) ×1 IMPLANT
SUT ETHILON 2 0 FS 18 (SUTURE) ×1 IMPLANT
SUT NOVA 1 T20/GS 25DT (SUTURE) ×6 IMPLANT
SUT VIC AB 3-0 SH 27 (SUTURE) ×2
SUT VIC AB 3-0 SH 27X BRD (SUTURE) IMPLANT
SYR CONTROL 10ML LL (SYRINGE) ×1 IMPLANT
TOWEL GREEN STERILE (TOWEL DISPOSABLE) ×2 IMPLANT
TOWEL GREEN STERILE FF (TOWEL DISPOSABLE) ×2 IMPLANT
TRAY FOLEY MTR SLVR 14FR STAT (SET/KITS/TRAYS/PACK) ×1 IMPLANT

## 2021-09-16 NOTE — Interval H&P Note (Signed)
History and Physical Interval Note: no change in H and P ? ?09/16/2021 ?7:57 AM ? ?Crystal Lee  has presented today for surgery, with the diagnosis of INCISIONAL HERNIA.  The various methods of treatment have been discussed with the patient and family. After consideration of risks, benefits and other options for treatment, the patient has consented to  Procedure(s): ?OPEN INCISIONAL HERNIA REPAIR WITH MESH (N/A) as a surgical intervention.  The patient's history has been reviewed, patient examined, no change in status, stable for surgery.  I have reviewed the patient's chart and labs.  Questions were answered to the patient's satisfaction.   ? ? ?[name] ? ? ?

## 2021-09-16 NOTE — Anesthesia Postprocedure Evaluation (Signed)
Anesthesia Post Note ? ?Patient: Delanda Bulluck ? ?Procedure(s) Performed: OPEN INCISIONAL HERNIA REPAIR WITH MESH ? ?  ? ?Patient location during evaluation: PACU ?Anesthesia Type: General ?Level of consciousness: sedated ?Pain management: pain level controlled ?Vital Signs Assessment: post-procedure vital signs reviewed and stable ?Respiratory status: spontaneous breathing and respiratory function stable ?Cardiovascular status: stable ?Postop Assessment: no apparent nausea or vomiting ?Anesthetic complications: no ? ? ?No notable events documented. ? ?Last Vitals:  ?Vitals:  ? 09/16/21 1225 09/16/21 1240  ?BP: (!) 153/66 (!) 152/66  ?Pulse: 81 78  ?Resp: (!) 23 19  ?Temp:    ?SpO2: 95% 95%  ?  ?Last Pain:  ?Vitals:  ? 09/16/21 1240  ?TempSrc:   ?PainSc: Asleep  ? ? ?  ?  ?  ?  ?  ?  ? ?Sheryl Towell DANIEL ? ? ? ? ?

## 2021-09-16 NOTE — Anesthesia Procedure Notes (Signed)
Procedure Name: Intubation ?Date/Time: 09/16/2021 8:34 AM ?Performed by: Barrington Ellison, CRNA ?Pre-anesthesia Checklist: Patient identified, Emergency Drugs available, Suction available and Patient being monitored ?Patient Re-evaluated:Patient Re-evaluated prior to induction ?Oxygen Delivery Method: Circle System Utilized ?Preoxygenation: Pre-oxygenation with 100% oxygen ?Induction Type: IV induction ?Ventilation: Mask ventilation without difficulty ?Laryngoscope Size: Mac and 3 ?Grade View: Grade I ?Tube type: Oral ?Tube size: 7.0 mm ?Number of attempts: 1 ?Airway Equipment and Method: Stylet and Oral airway ?Placement Confirmation: ETT inserted through vocal cords under direct vision, positive ETCO2 and breath sounds checked- equal and bilateral ?Secured at: 21 cm ?Tube secured with: Tape ?Dental Injury: Teeth and Oropharynx as per pre-operative assessment  ? ? ? ? ?

## 2021-09-16 NOTE — Op Note (Signed)
Pre Operative Diagnosis:  incisional ventral hernia ? ?Post Operative Diagnosis: 15x15 cm incisional ventral hernia ? ?Procedure: open incisional ventral hernia repair with underlay mesh and closure of fascia anteriorly ? ?Surgeon: Dr. [name] ? ?Assistant: Cameron Sprang, MD PGY-5 ? ?Anesthesia: general ? ?EBL: 50 cc ? ?Complications: none ? ?Counts: reported as correct x 2 ? ?Findings:  15x15 cm incisional ventral hernia ? ?Indications for procedure:  Crystal Lee is a 88 y.o. female who is seen today for a long-term follow-up. 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 She developed an incisional hernia. She reports that she felt like this was secondary to nausea and vomiting that occurred after drinking Ensure when she was home. She has discomfort from the hernia regarding mild cramping discomfort but no other obstructive symptoms. ?She underwent a repeat colonoscopy earlier this year and 3 benign polyps were found. She is still followed closely by medical oncology. A surveillance CT scan performed on January 29 of this year showed no evidence of recurrent cancer but did demonstrate the large hernia containing colon and small bowel. She is walking with a walker and reports that she is doing well from a cardiopulmonary standpoint ? ?Details of the procedure:The patient was taken back to the operating room. The patient was placed in supine position with bilateral SCDs in place. After appropriate anitbiotics were confirmed, a time-out was confirmed and all facts were verified ?the patient was prepped and draped in usual sterile fashion.  ? ?A #10 blade was used to make an elliptical incision around her upper midline prior scar.  The area of the prior scar was removed and the hernia sac was entered.  A few adhesions were taken down from the anterior abdominal wall freeing the bowel which was  then reduced into the abdomen.  The edges of the fascia were identified and the few loose adhesions between the bowel and fascial edges were taken down sharply and with cautery as needed. The hernia sac was then circumfrentially dissected away from the overlying skin and underlying fascia and was resected.  Umbilicus was not involved in the hernia sac.  Subcutaneous flaps were created above the fascia on either side of the hernia sac.  The large hernia defect was measured and found to be 15 cm x 15 cm. A 25x33 cm Bard ventralex coated mesh was trimmed to 23x23 cm and positioned below the fascia.  Interrupted sutures with #1 Novafil were used to suture the mesh to the fascia circumferentially. This allowed the mesh to be pulled taut.  Interrupted figure-of-eight sutures with #1 Novafil were used to reapproximate the sides of the midline fascia without excessive tension.  Marcaine was injected into the rectus space. The subcutaneous layer was then irrigated out with sterile saline.  A drain was placed in the subcutaneous space.  Excessive skin overlying the closed fascia was excised and directed 3-0 Vicryl was used to reapproximate the deep dermal layer.The skin was reapproximated using skin staples. The patient tolerated the procedure well was taken to the recovery room with an abdominal binder in stable condition. ? ? ?Cameron Sprang, MD ?[facility] Resident, PGY-5 ?

## 2021-09-16 NOTE — Transfer of Care (Signed)
Immediate Anesthesia Transfer of Care Note ? ?Patient: Crystal Lee ? ?Procedure(s) Performed: OPEN INCISIONAL HERNIA REPAIR WITH MESH ? ?Patient Location: PACU ? ?Anesthesia Type:General ? ?Level of Consciousness: awake ? ?Airway & Oxygen Therapy: Patient Spontanous Breathing and Patient connected to nasal cannula oxygen ? ?Post-op Assessment: Report given to RN ? ?Post vital signs: Reviewed and stable ? ?Last Vitals:  ?Vitals Value Taken Time  ?BP 151/70 09/16/21 1040  ?Temp    ?Pulse 81 09/16/21 1040  ?Resp 21 09/16/21 1040  ?SpO2 88 % 09/16/21 1040  ?Vitals shown include unvalidated device data. ? ?Last Pain:  ?Vitals:  ? 09/16/21 0720  ?TempSrc: Oral  ?PainSc:   ?   ? ?  ? ?Complications: No notable events documented. ?

## 2021-09-17 ENCOUNTER — Ambulatory Visit: Payer: Medicare Other | Admitting: Family Medicine

## 2021-09-17 ENCOUNTER — Encounter (HOSPITAL_COMMUNITY): Payer: Self-pay | Admitting: Surgery

## 2021-09-17 LAB — BASIC METABOLIC PANEL
Anion gap: 6 (ref 5–15)
BUN: 10 mg/dL (ref 8–23)
CO2: 23 mmol/L (ref 22–32)
Calcium: 8.3 mg/dL — ABNORMAL LOW (ref 8.9–10.3)
Chloride: 108 mmol/L (ref 98–111)
Creatinine, Ser: 0.6 mg/dL (ref 0.44–1.00)
GFR, Estimated: >60 mL/min (ref >60)
Glucose, Bld: 116 mg/dL — ABNORMAL HIGH (ref 70–99)
Potassium: 4.2 mmol/L (ref 3.5–5.1)
Sodium: 137 mmol/L (ref 135–145)

## 2021-09-17 LAB — CBC
HCT: 36.4 % (ref 36.0–46.0)
Hemoglobin: 11.9 g/dL — ABNORMAL LOW (ref 12.0–15.0)
MCH: 29.8 pg (ref 26.0–34.0)
MCHC: 32.7 g/dL (ref 30.0–36.0)
MCV: 91.2 fL (ref 80.0–100.0)
Platelets: 130 10*3/uL — ABNORMAL LOW (ref 150–400)
RBC: 3.99 MIL/uL (ref 3.87–5.11)
RDW: 13 % (ref 11.5–15.5)
WBC: 11.9 10*3/uL — ABNORMAL HIGH (ref 4.0–10.5)
nRBC: 0 % (ref 0.0–0.2)

## 2021-09-17 MED ORDER — CHLORHEXIDINE GLUCONATE CLOTH 2 % EX PADS
6.0000 | MEDICATED_PAD | Freq: Every day | CUTANEOUS | Status: DC
  Administered 2021-09-17 – 2021-09-18 (×2): 6 via TOPICAL

## 2021-09-17 MED ORDER — POLYETHYLENE GLYCOL 3350 17 G PO PACK
17.0000 g | PACK | Freq: Every day | ORAL | Status: DC
  Administered 2021-09-17 – 2021-09-21 (×5): 17 g via ORAL
  Filled 2021-09-17 (×5): qty 1

## 2021-09-17 NOTE — Plan of Care (Signed)
  Problem: Pain Managment: Goal: General experience of comfort will improve Outcome: Progressing   Problem: Safety: Goal: Ability to remain free from injury will improve Outcome: Progressing   

## 2021-09-17 NOTE — Plan of Care (Signed)
  Problem: Nutrition: Goal: Adequate nutrition will be maintained Outcome: Progressing   Problem: Pain Managment: Goal: General experience of comfort will improve Outcome: Progressing   Problem: Safety: Goal: Ability to remain free from injury will improve Outcome: Progressing   

## 2021-09-17 NOTE — Progress Notes (Signed)
1 Day Post-Op  ? ?Subjective/Chief Complaint: ?Complains of incisional pain, mild nausea this morning ? ? ?Objective: ?Vital signs in last 24 hours: ?Temp:  [97.6 ?F (36.4 ?C)-98.5 ?F (36.9 ?C)] 97.7 ?F (36.5 ?C) (04/20 0517) ?Pulse Rate:  [66-102] 72 (04/20 0517) ?Resp:  [17-29] 17 (04/20 0517) ?BP: (123-164)/(52-74) 123/52 (04/20 0517) ?SpO2:  [93 %-98 %] 93 % (04/20 0517) ?  ? ?Intake/Output from previous day: ?04/19 0701 - 04/20 0700 ?In: 1011.9 [I.V.:1011.9] ?Out: 0737 [[id number]; Drains:105; Blood:75] ?Intake/Output this shift: ?No intake/output data recorded. ? ?Exam: ?Awake and alert ?Follows commands ?Abdomen soft, minimally tender, drain serosang ? ?Lab Results:  ?Recent Labs  ?  09/17/21 ?4627  ?WBC 11.9*  ?HGB 11.9*  ?HCT 36.4  ?PLT 130*  ? ?BMET ?Recent Labs  ?  09/17/21 ?0314  ?NA 137  ?K 4.2  ?CL 108  ?CO2 23  ?GLUCOSE 116*  ?BUN 10  ?CREATININE 0.60  ?CALCIUM 8.3*  ? ?PT/INR ?No results for input(s): LABPROT, INR in the last 72 hours. ?ABG ?No results for input(s): PHART, HCO3 in the last 72 hours. ? ?Invalid input(s): PCO2, PO2 ? ?Studies/Results: ?No results found. ? ?Anti-infectives: ?Anti-infectives (From admission, onward)  ? ? Start     Dose/Rate Route Frequency Ordered Stop  ? 09/16/21 1700  nitrofurantoin (macrocrystal-monohydrate) (MACROBID) capsule 100 mg       ? 100 mg Oral Daily 09/16/21 1433    ? 09/16/21 0700  ceFAZolin (ANCEF) IVPB 2g/100 mL premix       ? 2 g ?200 mL/hr over 30 Minutes Intravenous On call to O.R. 09/16/21 0350 09/16/21 0835  ? ?  ? ? ?Assessment/Plan: ?s/p Procedure(s): ?OPEN INCISIONAL HERNIA REPAIR WITH MESH (N/A) ? ?Abdominal binder for comfort ?Out of bed, ambulate ?D/c foley ?Keep on clear liquids for now ? ? LOS: 1 day  ? ? ?[name] ?09/17/2021 ? ?

## 2021-09-17 NOTE — Progress Notes (Signed)
Mobility Specialist Progress Note: ? ? 09/17/21 1252  ?Mobility  ?Activity Ambulated with assistance in room  ?Level of Assistance Standby assist, set-up cues, supervision of patient - no hands on  ?Assistive Device Front wheel walker  ?Distance Ambulated (ft) 60 ft  ?Activity Response Tolerated well  ?$Mobility charge 1 Mobility  ? ?Pt received in bed willing to participate in mobility. Complaints of abdominal pain. Left in bed with call bell in reach and all needs met.  ? ?Crystal Lee ?Mobility Specialist ?Primary Phone [phone] ? ?

## 2021-09-18 ENCOUNTER — Encounter: Payer: Self-pay | Admitting: Hematology & Oncology

## 2021-09-18 LAB — CBC
HCT: 36.2 % (ref 36.0–46.0)
Hemoglobin: 11.6 g/dL — ABNORMAL LOW (ref 12.0–15.0)
MCH: 29.8 pg (ref 26.0–34.0)
MCHC: 32 g/dL (ref 30.0–36.0)
MCV: 93.1 fL (ref 80.0–100.0)
Platelets: 131 10*3/uL — ABNORMAL LOW (ref 150–400)
RBC: 3.89 MIL/uL (ref 3.87–5.11)
RDW: 13 % (ref 11.5–15.5)
WBC: 8.5 10*3/uL (ref 4.0–10.5)
nRBC: 0 % (ref 0.0–0.2)

## 2021-09-18 MED ORDER — BISACODYL 10 MG RE SUPP
10.0000 mg | Freq: Once | RECTAL | Status: AC
  Administered 2021-09-18: 10 mg via RECTAL
  Filled 2021-09-18: qty 1

## 2021-09-18 MED ORDER — POTASSIUM CHLORIDE IN NACL 20-0.9 MEQ/L-% IV SOLN
INTRAVENOUS | Status: DC
Start: 2021-09-18 — End: 2021-09-21
  Filled 2021-09-18 (×4): qty 1000

## 2021-09-18 MED ORDER — METOCLOPRAMIDE HCL 5 MG/ML IJ SOLN
5.0000 mg | Freq: Three times a day (TID) | INTRAMUSCULAR | Status: AC
  Administered 2021-09-18 – 2021-09-19 (×3): 5 mg via INTRAVENOUS
  Filled 2021-09-18 (×3): qty 2

## 2021-09-18 MED ORDER — OXYCODONE HCL 5 MG PO TABS
5.0000 mg | ORAL_TABLET | Freq: Four times a day (QID) | ORAL | 0 refills | Status: DC | PRN
Start: 2021-09-18 — End: 2021-09-29

## 2021-09-18 NOTE — Discharge Instructions (Signed)
[facility] _______[facility] ? ?UMBILICAL OR INGUINAL HERNIA REPAIR: POST OP INSTRUCTIONS ? ?Always review your discharge instruction sheet given to you by the facility where your surgery was performed. ?IF YOU HAVE DISABILITY OR FAMILY LEAVE FORMS, YOU MUST BRING THEM TO THE OFFICE FOR PROCESSING.   ?DO NOT GIVE THEM TO YOUR DOCTOR. ? ?1. A  prescription for pain medication may be given to you upon discharge.  Take your pain medication as prescribed, if needed.  If narcotic pain medicine is not needed, then you may take acetaminophen (Tylenol) or ibuprofen (Advil) as needed. ?2. Take your usually prescribed medications unless otherwise directed. ?If you need a refill on your pain medication, please contact your pharmacy.  They will contact our office to request authorization. Prescriptions will not be filled after 5 pm or on week-ends. ?3. You should follow a light diet the first 24 hours after arrival home, such as soup and crackers, etc.  Be sure to include lots of fluids daily.  Resume your normal diet the day after surgery. ?4.Most patients will experience some swelling and bruising around the umbilicus or in the groin and scrotum.  Ice packs and reclining will help.  Swelling and bruising can take several days to resolve.  ?6. It is common to experience some constipation if taking pain medication after surgery.  Increasing fluid intake and taking a stool softener (such as Colace) will usually help or prevent this problem from occurring.  A mild laxative (Milk of Magnesia or Miralax) should be taken according to package directions if there are no bowel movements after 48 hours. ?7. Unless discharge instructions indicate otherwise, you may remove your bandages 24-48 hours after surgery, and you may shower at that time.  You may have steri-strips (small skin tapes) in place directly over the incision.  These strips should be left on the skin for 7-10 days.  If your surgeon used skin glue on the  incision, you may shower in 24 hours.  The glue will flake off over the next 2-3 weeks.  Any sutures or staples will be removed at the office during your follow-up visit. ?8. ACTIVITIES:  You may resume regular (light) daily activities beginning the next day--such as daily self-care, walking, climbing stairs--gradually increasing activities as tolerated.  You may have sexual intercourse when it is comfortable.  Refrain from any heavy lifting or straining until approved by your doctor. ? ?a.You may drive when you are no longer taking prescription pain medication, you can comfortably wear a seatbelt, and you can safely maneuver your car and apply brakes. ?b.RETURN TO WORK:   ?_____________________________________________ ? ?9.You should see your doctor in the office for a follow-up appointment approximately 2-3 weeks after your surgery.  Make sure that you call for this appointment within a day or two after you arrive home to insure a convenient appointment time. ?10.OTHER INSTRUCTIONS: _RECORD DRAIN OUTPUT DAILY ?__OK TO SHOWER ?NO LIFTING MORE THAN 15 POUNDS FOR 6 WEEKS ?ICE PACK, TYLENOL, AND IBUPROFEN ALSO FOR PAIN ?_______________ ?   _____________________________________ ? ?WHEN TO CALL YOUR DOCTOR: ?Fever over 101.0 ?Inability to urinate ?Nausea and/or vomiting ?Extreme swelling or bruising ?Continued bleeding from incision. ?Increased pain, redness, or drainage from the incision ? ?The clinic staff is available to answer your questions during regular business hours.  Please don?t hesitate to call and ask to speak to one of the nurses for clinical concerns.  If you have a medical emergency, go to the nearest emergency room or  call 911.  A surgeon from [facility] is always on call at the hospital ? ? ?[address], [place], [place], [place]  [address] ? ? [address], [place], [place]   [address] ?[phone] ? FAX [phone] ?Web site: [email or web address]  ?

## 2021-09-18 NOTE — Progress Notes (Signed)
Mobility Specialist Progress Note: ? ? 09/18/21 1349  ?Mobility  ?Activity Ambulated with assistance in room  ?Level of Assistance Standby assist, set-up cues, supervision of patient - no hands on  ?Assistive Device Front wheel walker  ?Distance Ambulated (ft) 6 ft  ?Activity Response Tolerated well  ?$Mobility charge 1 Mobility  ? ?Pt received in chair wanting to get back to bed. Complaints of 10/10 abdominal pain, Pt also feeling nauseous. Left in bed with call bell in reach and all needs met.  ? ?Crystal Lee ?Mobility Specialist ?Primary Phone [phone] ? ?

## 2021-09-18 NOTE — Progress Notes (Signed)
Mobility Specialist Progress Note: ? ? 09/18/21 1300  ?Mobility  ?Activity Ambulated with assistance in room  ?Level of Assistance Standby assist, set-up cues, supervision of patient - no hands on  ?Assistive Device Front wheel walker  ?Distance Ambulated (ft) 15 ft  ?Activity Response Tolerated well  ?$Mobility charge 1 Mobility  ? ?PT received in bed willing to participate in mobility. Complaints of abdominal pain. Left in chair with call bell in reach and all needs met.  ? ?Andrej Spagnoli ?Mobility Specialist ?Primary Phone [phone] ? ?

## 2021-09-18 NOTE — Progress Notes (Signed)
2 Days Post-Op  ? ?Subjective/Chief Complaint: ?Had increase nausea over night ?Passing flatus ? ? ?Objective: ?Vital signs in last 24 hours: ?Temp:  [97.3 ?F (36.3 ?C)-99 ?F (37.2 ?C)] 98 ?F (36.7 ?C) (04/21 0729) ?Pulse Rate:  [75-80] 76 (04/21 0729) ?Resp:  [16-19] 16 (04/21 0729) ?BP: (135-158)/(52-88) 135/60 (04/21 0729) ?SpO2:  [89 %-91 %] 91 % (04/21 0729) ?  ? ?Intake/Output from previous day: ?04/20 0701 - 04/21 0700 ?In: -  ?Out: 76 [Drains:50] ?Intake/Output this shift: ?No intake/output data recorded. ? ?Exam: ?Looks comfortable ?Abdomen soft, but a little full, drain serosang ?Lab Results:  ?Recent Labs  ?  09/17/21 ?3154  ?WBC 11.9*  ?HGB 11.9*  ?HCT 36.4  ?PLT 130*  ? ?BMET ?Recent Labs  ?  09/17/21 ?0314  ?NA 137  ?K 4.2  ?CL 108  ?CO2 23  ?GLUCOSE 116*  ?BUN 10  ?CREATININE 0.60  ?CALCIUM 8.3*  ? ?PT/INR ?No results for input(s): LABPROT, INR in the last 72 hours. ?ABG ?No results for input(s): PHART, HCO3 in the last 72 hours. ? ?Invalid input(s): PCO2, PO2 ? ?Studies/Results: ?No results found. ? ?Anti-infectives: ?Anti-infectives (From admission, onward)  ? ? Start     Dose/Rate Route Frequency Ordered Stop  ? 09/16/21 1700  nitrofurantoin (macrocrystal-monohydrate) (MACROBID) capsule 100 mg       ? 100 mg Oral Daily 09/16/21 1433    ? 09/16/21 0700  ceFAZolin (ANCEF) IVPB 2g/100 mL premix       ? 2 g ?200 mL/hr over 30 Minutes Intravenous On call to O.R. 09/16/21 0086 09/16/21 0835  ? ?  ? ? ?Assessment/Plan: ?s/p Procedure(s): ?OPEN INCISIONAL HERNIA REPAIR WITH MESH (N/A) ? ?Post op ileus ?Try suppository ?Ambulate ?Home only when tolerating po and having bm's ? LOS: 2 days  ? ? ?[name] ?09/18/2021 ? ?

## 2021-09-19 LAB — BASIC METABOLIC PANEL
Anion gap: 5 (ref 5–15)
BUN: 7 mg/dL — ABNORMAL LOW (ref 8–23)
CO2: 22 mmol/L (ref 22–32)
Calcium: 8.6 mg/dL — ABNORMAL LOW (ref 8.9–10.3)
Chloride: 109 mmol/L (ref 98–111)
Creatinine, Ser: 0.52 mg/dL (ref 0.44–1.00)
GFR, Estimated: >60 mL/min (ref >60)
Glucose, Bld: 108 mg/dL — ABNORMAL HIGH (ref 70–99)
Potassium: 3.8 mmol/L (ref 3.5–5.1)
Sodium: 136 mmol/L (ref 135–145)

## 2021-09-19 NOTE — Progress Notes (Signed)
Mobility Specialist Progress Note: ? ? 09/19/21 1356  ?Mobility  ?Activity Ambulated with assistance in room  ?Level of Assistance Modified independent, requires aide device or extra time  ?Assistive Device Front wheel walker  ?Distance Ambulated (ft) 6 ft  ?Activity Response Tolerated well  ?$Mobility charge 1 Mobility  ? ?Pt received in chair asking to go back to bed. No complaints of pain. Left in bed with call bell in reach and all needs met.  ? ?Crystal Lee ?Mobility Specialist ?Primary Phone [phone] ? ?

## 2021-09-19 NOTE — Progress Notes (Signed)
Mobility Specialist Progress Note: ? ? 09/19/21 1313  ?Mobility  ?Activity Ambulated with assistance in room  ?Level of Assistance Modified independent, requires aide device or extra time  ?Assistive Device Front wheel walker  ?Distance Ambulated (ft) 250 ft  ?Activity Response Tolerated well  ?$Mobility charge 1 Mobility  ? ?PT received in bed willing to participate in mobility. Complaints of a headache. Left in chair with call bell in reach and all needs met.  ? ?Crystal Lee ?Mobility Specialist ?Primary Phone [phone] ? ?

## 2021-09-19 NOTE — Progress Notes (Signed)
3 Days Post-Op  ? ?Subjective/Chief Complaint: ?Patient doing well, still has some nausea, is having liquid bowel movements and passing flatus. ? ? ?Objective: ?Vital signs in last 24 hours: ?Temp:  [97.8 ?F (36.6 ?C)-98.6 ?F (37 ?C)] 98.6 ?F (37 ?C) (04/22 0730) ?Pulse Rate:  [78-97] 97 (04/22 0730) ?Resp:  [16-20] 16 (04/22 0730) ?BP: (121-167)/(58-72) 121/71 (04/22 0730) ?SpO2:  [92 %-96 %] 95 % (04/22 0730) ?  ? ?Intake/Output from previous day: ?04/21 0701 - 04/22 0700 ?In: 164.1 [I.V.:164.1] ?Out: 385 [Urine:350; Drains:35] ?Intake/Output this shift: ?No intake/output data recorded. ? ?General appearance: alert and cooperative ?GI: Soft, minimally distended, incision is clean dry and intact, JP drains are serosanguineous. ? ?Lab Results:  ?Recent Labs  ?  09/17/21 ?0314 09/18/21 ?0833  ?WBC 11.9* 8.5  ?HGB 11.9* 11.6*  ?HCT 36.4 36.2  ?PLT 130* 131*  ? ?BMET ?Recent Labs  ?  09/17/21 ?0314 09/19/21 ?0865  ?NA 137 136  ?K 4.2 3.8  ?CL 108 109  ?CO2 23 22  ?GLUCOSE 116* 108*  ?BUN 10 7*  ?CREATININE 0.60 0.52  ?CALCIUM 8.3* 8.6*  ? ?PT/INR ?No results for input(s): LABPROT, INR in the last 72 hours. ?ABG ?No results for input(s): PHART, HCO3 in the last 72 hours. ? ?Invalid input(s): PCO2, PO2 ? ?Studies/Results: ?No results found. ? ?Anti-infectives: ?Anti-infectives (From admission, onward)  ? ? Start     Dose/Rate Route Frequency Ordered Stop  ? 09/16/21 1700  nitrofurantoin (macrocrystal-monohydrate) (MACROBID) capsule 100 mg       ? 100 mg Oral Daily 09/16/21 1433    ? 09/16/21 0700  ceFAZolin (ANCEF) IVPB 2g/100 mL premix       ? 2 g ?200 mL/hr over 30 Minutes Intravenous On call to O.R. 09/16/21 7846 09/16/21 0835  ? ?  ? ? ?Assessment/Plan: ?s/p Procedure(s): ?OPEN INCISIONAL HERNIA REPAIR WITH MESH (N/A) ?Postop ileus, continue to go slow and continue with clear liquids for now.  May be able to increase and advance diet tomorrow if abdominal distention/bloating is better. ?Continue to ambulate and sit  in chair as tolerated. ?Home when tolerating p.o. well and having good bowel function. ? ? LOS: 3 days  ? ? ?Ralene Ok ?09/19/2021 ? ?

## 2021-09-19 NOTE — Progress Notes (Deleted)
[place] at [facility] [address] [place],  [address] [phone]  Date:  09/23/2021   Name:  Crystal Lee   DOB:  September 24, 1933   MRN:  [id number]  PCP:  Darreld Mclean, MD    Chief Complaint: No chief complaint on file.   History of Present Illness:  Crystal Lee is a 88 y.o. very pleasant female patient who presents with the following:  Following up today- History of breast cancer status post bilateral mastectomy, osteoporosis, hypertension, hyperlipidemia, postlaminectomy syndrome and diagnosed with colon cancer 1/22 She ws seen by neurology most recently in February- thought to have mild late onset dementia, mixed Alzheimer and vascular  Last seen by myself 11/22, following recent admission for stroke like sx  She was recently admitted for several days or open incisionl hernia repair with post- op ileus   Shingrix Covid booster   Patient Active Problem List   Diagnosis Date Noted   Incisional hernia 09/16/2021   Pain 05/05/2021   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 behavioral disturbance, psychotic disturbance, mood disturbance, or anxiety ([place]) 05/05/2021   Stroke-like symptoms 03/25/2021   Sleep apnea    Rosacea    PONV (postoperative nausea and vomiting)    Nocturia    Neuropathy    Melanoma ([place])    Hypertension    Hyperlipidemia    Fractured hip ([place])    Difficulty sleeping    Complication of anesthesia    Cancer ([place])    Arthritis    Malignant neoplasm of descending colon ([place])    Gastric polyps    Colonic mass    Diverticulosis of colon without hemorrhage    Adenomatous polyp of sigmoid colon    Acute lower GI bleeding 06/09/2020   Lower GI bleed 06/08/2020   Acute blood loss anemia 06/08/2020   HTN (hypertension) 06/08/2020   Constipation    Encounter for orthopedic follow-up care 06/18/2019   Urinary tract infection 04/20/2019   Acute pain of right wrist 03/28/2019   Tenosynovitis, wrist  03/28/2019   Acquired trigger finger of right little finger 01/15/2019   Carpal tunnel syndrome of right wrist 01/15/2019   Aftercare 08/17/2018   Lumbar post-laminectomy syndrome 07/31/2018   Heartburn    Fracture of superior pubic ramus ([place]) 06/16/2018   Radial styloid tenosynovitis of left hand 05/16/2018   Pain of left hand 04/20/2018   Osteopenia 04/15/2018   Dyslipidemia 02/02/2018   GERD (gastroesophageal reflux disease) 02/02/2018   Trochanteric bursitis of left hip 12/23/2017   Primary osteoarthritis of left knee 10/17/2017   History of right knee joint replacement 10/17/2017   Pain in both lower extremities 08/02/2017   Stage 1 breast cancer, ER+, right ([place]) 07/04/2017   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   Pain in right hand 06/10/2017   Trigger finger of right hand 06/10/2017   Paresthesia 02/09/2017   Low back pain 02/09/2017   Gait abnormality 02/09/2017   OA (osteoarthritis) of hip 05/09/2015    Past Medical History:  Diagnosis Date   Arthritis    Cancer ([place])    HX BREAST CANCER/ SKIN CANCER   Colon cancer ([place])    Complication of anesthesia    N/V WITH MORPHINE   Difficulty sleeping    Fractured hip ([place])    LEFT - AUG 2016   GERD (gastroesophageal reflux disease)    Hyperlipidemia    Hypertension    Melanoma ([place])    Neuropathy  Nocturia    Osteopenia    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   PONV (postoperative nausea and vomiting)    PT STATES MORPHINE CAUSED N/V   Rosacea    Sleep apnea    Stage 1 breast cancer, ER+, right ([place]) 07/04/2017    Past Surgical History:  Procedure Laterality Date   44 HOUR [place] STUDY N/A 06/21/2018   Procedure: 24 HOUR PH STUDY;  Surgeon: Lavena Bullion, DO;  Location: [facility];  Service: Gastroenterology;  Laterality: N/A;  with impedance   ABDOMINAL HYSTERECTOMY  2010   ANAL RECTAL MANOMETRY N/A 09/19/2019   Procedure: ANO RECTAL MANOMETRY;  Surgeon: Mauri Pole,  MD;  Location: [facility];  Service: Endoscopy;  Laterality: N/A;   [place]   BIOPSY  06/10/2020   Procedure: BIOPSY;  Surgeon: Lavena Bullion, DO;  Location: [facility];  Service: Gastroenterology;;  EGD and COLON   BREAST SURGERY     CATARACT EXTRACTION Bilateral 2011   CHOLECYSTECTOMY     COLON RESECTION N/A 06/12/2020   Procedure: LAPAROSCOPIC COLON RESECTION;  Surgeon: [name], MD;  Location: [facility];  Service: General;  Laterality: N/A;   COLONOSCOPY WITH PROPOFOL N/A 06/10/2020   Procedure: COLONOSCOPY WITH PROPOFOL;  Surgeon: Lavena Bullion, DO;  [facility]: [facility];  Service: Gastroenterology;  Laterality: N/A;   ESOPHAGEAL MANOMETRY N/A 06/21/2018   Procedure: ESOPHAGEAL MANOMETRY (EM);  Surgeon: Lavena Bullion, DO;  [facility]: [facility];  Service: Gastroenterology;  Laterality: N/A;   ESOPHAGOGASTRODUODENOSCOPY (EGD) WITH PROPOFOL N/A 06/10/2020   Procedure: ESOPHAGOGASTRODUODENOSCOPY (EGD) WITH PROPOFOL;  Surgeon: Lavena Bullion, DO;  [facility]: [facility];  Service: Gastroenterology;  Laterality: N/A;   GALLBLADDER SURGERY  2015   INCISIONAL HERNIA REPAIR N/A 09/16/2021   Procedure: OPEN INCISIONAL HERNIA REPAIR WITH MESH;  Surgeon: [name], MD;  Location: [place];  Service: General;  Laterality: N/A;   JOINT REPLACEMENT     RT TOTAL HIP / RT [place]    [place] STUDY  06/21/2018   Procedure: [place] IMPEDANCE STUDY;  Surgeon: Lavena Bullion, DO;  Location: [facility];  Service: Gastroenterology;;   POLYPECTOMY  06/10/2020   Procedure: POLYPECTOMY;  Surgeon: Lavena Bullion, DO;  [facility]: [facility];  Service: Gastroenterology;;   SKIN CANCER EXCISION  2016   RT SIDE OF NOSE   SUBMUCOSAL TATTOO INJECTION  06/10/2020   Procedure: SUBMUCOSAL TATTOO INJECTION;  Surgeon: Lavena Bullion, DO;  [facility]: [facility];  Service: Gastroenterology;;   [place]  2010   RIGHT   TOTAL HIP ARTHROPLASTY Left 05/09/2015   Procedure: LEFT TOTAL HIP ARTHROPLASTY ANTERIOR APPROACH;  Surgeon: Gaynelle Arabian, MD;  Location: [facility];  Service: Orthopedics;  Laterality: Left;   TOTAL KNEE ARTHROPLASTY  2001   TUMOR REMOVAL  2012   ABDOMINAL - NON CANCEROUS    Social History   Tobacco Use   Smoking status: Former    Packs/day: 0.50    Years: 20.00    Pack years: 10.00    Types: Cigarettes    Quit date: 02/03/1976    Years since quitting: 45.6   Smokeless tobacco: Never  Vaping Use   Vaping Use: Never used  Substance Use Topics   Alcohol use: No   Drug use: No    Family History  Problem Relation Age of Onset  Other Mother        complications from flu   Other Father        unsure of cause   Colon cancer Neg Hx     Allergies  Allergen Reactions   Darvon [Propoxyphene] Nausea And Vomiting and Palpitations    Darvocet Causes Sweats   Adhesive [Tape] Rash   Morphine And Related Nausea And Vomiting    Medication list has been reviewed and updated.  Current Facility-Administered Medications on File Prior to Visit  Medication Dose Route Frequency Provider Last Rate Last Admin   0.9 % NaCl with KCl 20 mEq/ L  infusion   Intravenous Continuous [name], MD 50 mL/hr at 09/19/21 0759 New Bag at 09/19/21 0759   acetaminophen (TYLENOL) tablet 1,000 mg  1,000 mg Oral Q6H [name], MD   1,000 mg at 09/19/21 9163   Chlorhexidine Gluconate Cloth 2 % PADS 6 each  6 each Topical Daily [name], MD   6 each at 09/18/21 1008   diphenhydrAMINE (BENADRYL) 12.5 MG/5ML elixir 12.5 mg  12.5 mg Oral Q6H PRN [name], MD       Or   diphenhydrAMINE (BENADRYL) injection 12.5 mg  12.5 mg Intravenous Q6H PRN [name], MD       donepezil (ARICEPT) tablet 10 mg  10 mg Oral Daily [name], MD   10 mg at 09/19/21 0959   enoxaparin (LOVENOX) injection 40 mg  40 mg Subcutaneous Q24H  [name], MD   40 mg at 09/19/21 0743   HYDROmorphone (DILAUDID) injection 1 mg  1 mg Intravenous Q2H PRN [name], MD   0.5 mg at 09/19/21 1116   losartan (COZAAR) tablet 25 mg  25 mg Oral Daily [name], MD   25 mg at 09/19/21 0959   methocarbamol (ROBAXIN) tablet 500 mg  500 mg Oral Q6H PRN [name], MD       nitrofurantoin (macrocrystal-monohydrate) (MACROBID) capsule 100 mg  100 mg Oral Daily [name], MD   100 mg at 09/19/21 0959   ondansetron (ZOFRAN-ODT) disintegrating tablet 4 mg  4 mg Oral Q6H PRN [name], MD       Or   ondansetron [facility]) injection 4 mg  4 mg Intravenous Q6H PRN [name], MD   4 mg at 09/19/21 8466   oxyCODONE (Oxy IR/ROXICODONE) immediate release tablet 5-10 mg  5-10 mg Oral Q4H PRN [name], MD   10 mg at 09/19/21 0753   polyethylene glycol (MIRALAX / GLYCOLAX) packet 17 g  17 g Oral Daily [name], MD   17 g at 09/19/21 0959   traMADol (ULTRAM) tablet 50 mg  50 mg Oral Q6H PRN [name], MD       Current Outpatient Medications on File Prior to Visit  Medication Sig Dispense Refill   aspirin EC 81 MG tablet Take 81 mg by mouth daily. Swallow whole.     atorvastatin (LIPITOR) 20 MG tablet Take 1 tablet (20 mg total) by mouth daily. 30 tablet 0   cyclobenzaprine (FLEXERIL) 5 MG tablet Take 1 tablet (5 mg total) by mouth at bedtime as needed for muscle spasms. 30 tablet 2   donepezil (ARICEPT) 10 MG tablet Take half tablet (5 mg) daily for 2 weeks, then increase to the full tablet at 10 mg daily (Patient taking differently: Take 10 mg by mouth daily.) 30 tablet 11   estradiol (ESTRACE VAGINAL) 0.1 MG/GM vaginal cream Apply 1gm vaginally 1-3x a week as needed  for comfort (Patient taking differently: Place 1 g vaginally See admin instructions. 1g vaginally once to three times a week as needed for comfort with UTI) 42.5 g 12   HYDROcodone-acetaminophen (NORCO) 10-325 MG tablet Take 1  tablet by mouth every 6 (six) hours as needed. (Patient taking differently: Take 1 tablet by mouth every 6 (six) hours as needed for moderate pain.) 120 tablet 0   losartan (COZAAR) 25 MG tablet Take 1 tablet (25 mg total) by mouth daily. 90 tablet 3   Multiple Vitamins-Minerals (MULTIVITAMIN ADULTS) TABS Take 1 tablet by mouth daily.     nitrofurantoin, macrocrystal-monohydrate, (MACROBID) 100 MG capsule Take 1 capsule (100 mg total) by mouth daily. 90 capsule 1   oxyCODONE (OXY IR/ROXICODONE) 5 MG immediate release tablet Take 1 tablet (5 mg total) by mouth every 6 (six) hours as needed for moderate pain or severe pain. 20 tablet 0   phenazopyridine (PYRIDIUM) 200 MG tablet Take 1 tablet (200 mg total) by mouth 3 (three) times daily as needed for pain. 40 tablet 0   vitamin B-12 (CYANOCOBALAMIN) 1000 MCG tablet Take 1,000 mcg by mouth daily.      Review of Systems:  As per HPI- otherwise negative.   Physical Examination: There were no vitals filed for this visit. There were no vitals filed for this visit. There is no height or weight on file to calculate BMI. Ideal Body Weight:    GEN: no acute distress. HEENT: Atraumatic, Normocephalic.  Ears and Nose: No external deformity. CV: RRR, No M/G/R. No JVD. No thrill. No extra heart sounds. PULM: CTA B, no wheezes, crackles, rhonchi. No retractions. No resp. distress. No accessory muscle use. ABD: S, NT, ND, +BS. No rebound. No HSM. EXTR: No c/c/e PSYCH: Normally interactive. Conversant.    Assessment and Plan: ***  Signed Lamar Blinks, MD

## 2021-09-20 NOTE — Progress Notes (Signed)
4 Days Post-Op  ? ?Subjective/Chief Complaint: ?Patient doing well.  There are still some nausea with full liquids. ?+ bowel movements. ?Ambulating in hall and sitting in chair ? ?Objective: ?Vital signs in last 24 hours: ?Temp:  [97.7 ?F (36.5 ?C)-98.8 ?F (37.1 ?C)] 97.7 ?F (36.5 ?C) (04/23 0504) ?Pulse Rate:  [68-78] 68 (04/23 0504) ?Resp:  [16-18] 18 (04/23 0504) ?BP: (132-167)/(58-66) 141/58 (04/23 0504) ?SpO2:  [93 %-96 %] 94 % (04/23 0504) ?Last BM Date : 09/19/21 ? ?Intake/Output from previous day: ?04/22 0701 - 04/23 0700 ?In: -  ?Out: 3 [Drains:3] ?Intake/Output this shift: ?No intake/output data recorded. ? ?General appearance: alert and cooperative ?GI: soft, non-tender; bowel sounds normal; no masses,  no organomegaly and incision clean dry and intact ? ?Lab Results:  ?Recent Labs  ?  09/18/21 ?0833  ?WBC 8.5  ?HGB 11.6*  ?HCT 36.2  ?PLT 131*  ? ?BMET ?Recent Labs  ?  09/19/21 ?6195  ?NA 136  ?K 3.8  ?CL 109  ?CO2 22  ?GLUCOSE 108*  ?BUN 7*  ?CREATININE 0.52  ?CALCIUM 8.6*  ? ?PT/INR ?No results for input(s): LABPROT, INR in the last 72 hours. ?ABG ?No results for input(s): PHART, HCO3 in the last 72 hours. ? ?Invalid input(s): PCO2, PO2 ? ?Studies/Results: ?No results found. ? ?Anti-infectives: ?Anti-infectives (From admission, onward)  ? ? Start     Dose/Rate Route Frequency Ordered Stop  ? 09/16/21 1700  nitrofurantoin (macrocrystal-monohydrate) (MACROBID) capsule 100 mg       ? 100 mg Oral Daily 09/16/21 1433    ? 09/16/21 0700  ceFAZolin (ANCEF) IVPB 2g/100 mL premix       ? 2 g ?200 mL/hr over 30 Minutes Intravenous On call to O.R. 09/16/21 0932 09/16/21 0835  ? ?  ? ? ?Assessment/Plan: ? ?s/p Procedure(s): ?OPEN INCISIONAL HERNIA REPAIR WITH MESH (N/A) ?Postop ileus, continue to go slow and continue with full liquids for now.  May be able to increase and advance diet tomorrow if abdominal distention/bloating is better. ?Continue to ambulate and sit in chair as tolerated. ?Home when tolerating p.o.  well and having good bowel function. ? LOS: 4 days  ? ? ?Ralene Ok ?09/20/2021 ? ?

## 2021-09-20 NOTE — Progress Notes (Signed)
Mobility Specialist Progress Note: ? ? 09/20/21 1245  ?Mobility  ?Activity Ambulated with assistance to bathroom  ?Level of Assistance Independent after set-up  ?Assistive Device Front wheel walker  ?Distance Ambulated (ft) 20 ft  ?Activity Response Tolerated well  ?$Mobility charge 1 Mobility  ? ?Pt ambulated to BR, able to have BM. Complaints of 8/10 pain. Left in chair with call bell in reach and visitors present. Will follow-up for further ambulation.  ? ?Hadden Steig ?Mobility Specialist ?Primary Phone [phone] ? ?

## 2021-09-20 NOTE — Progress Notes (Signed)
Mobility Specialist Progress Note: ? ? 09/20/21 1402  ?Mobility  ?Activity Ambulated with assistance in hallway  ?Level of Assistance Standby assist, set-up cues, supervision of patient - no hands on  ?Assistive Device Front wheel walker  ?Distance Ambulated (ft) 800 ft  ?Activity Response Tolerated well  ?$Mobility charge 1 Mobility  ? ?Pt received in chair asking to walk in hall. Complaints of abdomen pain. Left in bed with call bell in reach and all needs met.  ? ?Medha Pippen ?Mobility Specialist ?Primary Phone [phone] ? ?

## 2021-09-21 NOTE — TOC Progression Note (Signed)
Transition of Care (TOC) - Progression Note  ? ? ?Patient Details  ?Name: Crystal Lee ?MRN: [id number] ?Date of Birth: [birth year] ? ?Transition of Care (TOC) CM/SW Contact  ?Marilu Favre, RN ?Phone Number: ?09/21/2021, 10:09 AM ? ?Clinical Narrative:    ? ? ?Transition of Care (TOC) Screening Note ? ? ?Patient Details  ?Name: Crystal Lee ?Date of Birth: 18-Jan-1934 ? ? ? ?Transition of Care Department [facility]) has reviewed patient and no TOC needs have been identified at this time. We will continue to monitor patient advancement through interdisciplinary progression rounds. If new patient transition needs arise, please place a TOC consult. ?  ? ?  ?  ? ?Expected Discharge Plan and Services ?  ?  ?  ?  ?  ?                ?  ?  ?  ?  ?  ?  ?  ?  ?  ?  ? ? ?Social Determinants of Health (SDOH) Interventions ?  ? ?Readmission Risk Interventions ?   ? View : No data to display.  ?  ?  ?  ? ? ?

## 2021-09-21 NOTE — Discharge Summary (Signed)
Physician Discharge Summary  ?Patient ID: ?Crystal Lee ?MRN: [id number] ?DOB/AGE: March 08, 1934 88 y.o. ? ?Admit date: 09/16/2021 ?Discharge date: 09/21/2021 ? ?Admission Diagnoses: ? ?Discharge Diagnoses:  ?Principal Problem: ?  Incisional hernia ? ? ?Discharged Condition: good ? ?Hospital Course: admitted post op.  Had several days of post op nausea and the bowel function returned.  By POD#5 she was doing well, tolerating po, and nausea had resolved.  Her drain was removed and she was discharged home ? ?Consults: None ? ?Significant Diagnostic Studies:  ? ?Treatments: surgery: open incisional hernia repair with mesh ? ?Discharge Exam: ?Blood pressure (!) 151/58, pulse 68, temperature 98 ?F (36.7 ?C), temperature source Oral, resp. rate 20, SpO2 94 %. ?General appearance: alert, cooperative, and no distress ?Resp: clear to auscultation bilaterally ?Cardio: regular rate and rhythm, S1, S2 normal, no murmur, click, rub or gallop ?Incision/Wound: abdomen soft, incision healing well ? ?Disposition: Discharge disposition: 01-Home or Self Care ? ? ? ? ? ? ? ?Allergies as of 09/21/2021   ? ?   Reactions  ? Darvon [propoxyphene] Nausea And Vomiting, Palpitations  ? Darvocet Causes Sweats  ? Adhesive [tape] Rash  ? Morphine And Related Nausea And Vomiting  ? ?  ? ?  ?Medication List  ?  ? ?TAKE these medications   ? ?aspirin EC 81 MG tablet ?Take 81 mg by mouth daily. Swallow whole. ?  ?atorvastatin 20 MG tablet ?Commonly known as: LIPITOR ?Take 1 tablet (20 mg total) by mouth daily. ?  ?cyclobenzaprine 5 MG tablet ?Commonly known as: FLEXERIL ?Take 1 tablet (5 mg total) by mouth at bedtime as needed for muscle spasms. ?  ?donepezil 10 MG tablet ?Commonly known as: ARICEPT ?Take half tablet (5 mg) daily for 2 weeks, then increase to the full tablet at 10 mg daily ?What changed:  ?how much to take ?how to take this ?when to take this ?additional instructions ?  ?estradiol 0.1 MG/GM vaginal cream ?Commonly known as: ESTRACE  VAGINAL ?Apply 1gm vaginally 1-3x a week as needed for comfort ?What changed:  ?how much to take ?how to take this ?when to take this ?additional instructions ?  ?HYDROcodone-acetaminophen 10-325 MG tablet ?Commonly known as: NORCO ?Take 1 tablet by mouth every 6 (six) hours as needed. ?What changed: reasons to take this ?  ?losartan 25 MG tablet ?Commonly known as: COZAAR ?Take 1 tablet (25 mg total) by mouth daily. ?  ?Multivitamin Adults Tabs ?Take 1 tablet by mouth daily. ?  ?nitrofurantoin (macrocrystal-monohydrate) 100 MG capsule ?Commonly known as: Macrobid ?Take 1 capsule (100 mg total) by mouth daily. ?  ?oxyCODONE 5 MG immediate release tablet ?Commonly known as: Oxy IR/ROXICODONE ?Take 1 tablet (5 mg total) by mouth every 6 (six) hours as needed for moderate pain or severe pain. ?  ?phenazopyridine 200 MG tablet ?Commonly known as: Pyridium ?Take 1 tablet (200 mg total) by mouth 3 (three) times daily as needed for pain. ?  ?vitamin B-12 1000 MCG tablet ?Commonly known as: CYANOCOBALAMIN ?Take 1,000 mcg by mouth daily. ?  ? ?  ? ? Follow-up Information   ? ? [name], MD Follow up on 10/02/2021.   ?Specialty: General Surgery ?Contact information: ?[address] ?[address] ?[place] [address] ?[phone] ? ? ?  ?  ? ?  ?  ? ?  ? ? ?Signed: ?[name] ?09/21/2021, 12:17 PM ? ? ?

## 2021-09-21 NOTE — Progress Notes (Signed)
Patient ID: Crystal Lee, female   DOB: 04/20/1934, 88 y.o.   MRN: [id number] ? ? ?Feeling better and wants to go home ?Drain removed without problems ?Incision clean ? ?Plan: ?Discharge home today ?

## 2021-09-21 NOTE — Care Management Important Message (Signed)
Important Message ? ?Patient Details  ?Name: Crystal Lee ?MRN: [id number] ?Date of Birth: [birth year] ? ? ?Medicare Important Message Given:  Yes ? ? ? ? ?Lynia Landry ?09/21/2021, 4:40 PM ?

## 2021-09-21 NOTE — Progress Notes (Signed)
5 Days Post-Op  ? ?Subjective/Chief Complaint: ?Less nausea ?Having BM's ?Feeling berrer ? ? ?Objective: ?Vital signs in last 24 hours: ?Temp:  [98.2 ?F (36.8 ?C)-98.6 ?F (37 ?C)] 98.2 ?F (36.8 ?C) (04/24 0441) ?Pulse Rate:  [65-73] 69 (04/24 0441) ?Resp:  [16-22] 22 (04/24 0441) ?BP: (130-158)/(47-59) 158/59 (04/24 0441) ?SpO2:  [94 %-96 %] 95 % (04/24 0441) ?Last BM Date : 09/19/21 ? ?Intake/Output from previous day: ?04/23 0701 - 04/24 0700 ?In: 6712 [P.O.:840; I.V.:2750] ?Out: 5 [Drains:5] ?Intake/Output this shift: ?No intake/output data recorded. ? ?Exam: ?Awake and alert ?Abdomen soft, drain serosang ? ?Lab Results:  ?Recent Labs  ?  09/18/21 ?0833  ?WBC 8.5  ?HGB 11.6*  ?HCT 36.2  ?PLT 131*  ? ?BMET ?Recent Labs  ?  09/19/21 ?4580  ?NA 136  ?K 3.8  ?CL 109  ?CO2 22  ?GLUCOSE 108*  ?BUN 7*  ?CREATININE 0.52  ?CALCIUM 8.6*  ? ?PT/INR ?No results for input(s): LABPROT, INR in the last 72 hours. ?ABG ?No results for input(s): PHART, HCO3 in the last 72 hours. ? ?Invalid input(s): PCO2, PO2 ? ?Studies/Results: ?No results found. ? ?Anti-infectives: ?Anti-infectives (From admission, onward)  ? ? Start     Dose/Rate Route Frequency Ordered Stop  ? 09/16/21 1700  nitrofurantoin (macrocrystal-monohydrate) (MACROBID) capsule 100 mg       ? 100 mg Oral Daily 09/16/21 1433    ? 09/16/21 0700  ceFAZolin (ANCEF) IVPB 2g/100 mL premix       ? 2 g ?200 mL/hr over 30 Minutes Intravenous On call to O.R. 09/16/21 9983 09/16/21 0835  ? ?  ? ? ?Assessment/Plan: ?s/p Procedure(s): ?OPEN INCISIONAL HERNIA REPAIR WITH MESH (N/A) ? ?Stop IV fluids ?Potential discharge today vs tomorrow ?Will remove drain at discharge given minimal output ? LOS: 5 days  ? ? ?[name] ?09/21/2021 ? ?

## 2021-09-21 NOTE — Progress Notes (Signed)
Mobility Specialist Progress Note: ? ? 09/21/21 1043  ?Mobility  ?Activity Ambulated with assistance in hallway  ?Level of Assistance Standby assist, set-up cues, supervision of patient - no hands on  ?Assistive Device Front wheel walker  ?Distance Ambulated (ft) 600 ft  ?Activity Response Tolerated well  ?$Mobility charge 1 Mobility  ? ?Pt received in chair willing to participate in mobility. Complaints of 6/10 abdominal pain. Left in chair with call bell in reach and all needs met.  ? ?  ?Mobility Specialist ?Primary Phone [phone] ? ?

## 2021-09-23 ENCOUNTER — Ambulatory Visit: Payer: Medicare Other | Admitting: Family Medicine

## 2021-09-23 ENCOUNTER — Encounter: Payer: Self-pay | Admitting: Hematology & Oncology

## 2021-09-25 ENCOUNTER — Inpatient Hospital Stay (HOSPITAL_BASED_OUTPATIENT_CLINIC_OR_DEPARTMENT_OTHER): Payer: Medicare Other | Admitting: Hematology & Oncology

## 2021-09-25 ENCOUNTER — Inpatient Hospital Stay: Payer: Medicare Other | Attending: Hematology & Oncology

## 2021-09-25 ENCOUNTER — Ambulatory Visit: Payer: Medicare Other | Admitting: Hematology & Oncology

## 2021-09-25 ENCOUNTER — Other Ambulatory Visit: Payer: Self-pay

## 2021-09-25 ENCOUNTER — Inpatient Hospital Stay: Payer: Medicare Other

## 2021-09-25 ENCOUNTER — Encounter: Payer: Self-pay | Admitting: Hematology & Oncology

## 2021-09-25 ENCOUNTER — Other Ambulatory Visit: Payer: Medicare Other

## 2021-09-25 VITALS — BP 161/55 | HR 71 | Resp 18

## 2021-09-25 VITALS — BP 147/64 | HR 79 | Temp 98.2°F | Resp 20 | Ht 66.0 in | Wt 201.1 lb

## 2021-09-25 DIAGNOSIS — C186 Malignant neoplasm of descending colon: Secondary | ICD-10-CM

## 2021-09-25 DIAGNOSIS — R109 Unspecified abdominal pain: Secondary | ICD-10-CM | POA: Diagnosis not present

## 2021-09-25 DIAGNOSIS — C184 Malignant neoplasm of transverse colon: Secondary | ICD-10-CM | POA: Diagnosis not present

## 2021-09-25 DIAGNOSIS — C50911 Malignant neoplasm of unspecified site of right female breast: Secondary | ICD-10-CM

## 2021-09-25 DIAGNOSIS — R197 Diarrhea, unspecified: Secondary | ICD-10-CM | POA: Diagnosis not present

## 2021-09-25 LAB — CMP (CANCER CENTER ONLY)
ALT: 20 U/L (ref 0–44)
AST: 25 U/L (ref 15–41)
Albumin: 3.9 g/dL (ref 3.5–5.0)
Alkaline Phosphatase: 101 U/L (ref 38–126)
Anion gap: 6 (ref 5–15)
BUN: 7 mg/dL — ABNORMAL LOW (ref 8–23)
CO2: 28 mmol/L (ref 22–32)
Calcium: 9.5 mg/dL (ref 8.9–10.3)
Chloride: 104 mmol/L (ref 98–111)
Creatinine: 0.62 mg/dL (ref 0.44–1.00)
GFR, Estimated: >60 mL/min (ref >60)
Glucose, Bld: 103 mg/dL — ABNORMAL HIGH (ref 70–99)
Potassium: 4.2 mmol/L (ref 3.5–5.1)
Sodium: 138 mmol/L (ref 135–145)
Total Bilirubin: 0.6 mg/dL (ref 0.3–1.2)
Total Protein: 7.2 g/dL (ref 6.5–8.1)

## 2021-09-25 LAB — CBC WITH DIFFERENTIAL (CANCER CENTER ONLY)
Abs Immature Granulocytes: 0.01 10*3/uL (ref 0.00–0.07)
Basophils Absolute: 0 10*3/uL (ref 0.0–0.1)
Basophils Relative: 0 %
Eosinophils Absolute: 0.3 10*3/uL (ref 0.0–0.5)
Eosinophils Relative: 5 %
HCT: 40.5 % (ref 36.0–46.0)
Hemoglobin: 13 g/dL (ref 12.0–15.0)
Immature Granulocytes: 0 %
Lymphocytes Relative: 34 %
Lymphs Abs: 2.3 10*3/uL (ref 0.7–4.0)
MCH: 29.6 pg (ref 26.0–34.0)
MCHC: 32.1 g/dL (ref 30.0–36.0)
MCV: 92.3 fL (ref 80.0–100.0)
Monocytes Absolute: 0.4 10*3/uL (ref 0.1–1.0)
Monocytes Relative: 6 %
Neutro Abs: 3.7 10*3/uL (ref 1.7–7.7)
Neutrophils Relative %: 55 %
Platelet Count: 148 10*3/uL — ABNORMAL LOW (ref 150–400)
RBC: 4.39 MIL/uL (ref 3.87–5.11)
RDW: 13.3 % (ref 11.5–15.5)
WBC Count: 6.7 10*3/uL (ref 4.0–10.5)
nRBC: 0 % (ref 0.0–0.2)

## 2021-09-25 LAB — CEA (IN HOUSE-CHCC): CEA (CHCC-In House): 2.76 ng/mL (ref 0.00–5.00)

## 2021-09-25 MED ORDER — HYDROMORPHONE HCL 1 MG/ML IJ SOLN
1.0000 mg | Freq: Once | INTRAMUSCULAR | Status: DC
  Filled 2021-09-25: qty 1

## 2021-09-25 MED ORDER — SODIUM CHLORIDE 0.9 % IV SOLN
40.0000 mg | Freq: Once | INTRAVENOUS | Status: AC
  Administered 2021-09-25: 40 mg via INTRAVENOUS
  Filled 2021-09-25: qty 4

## 2021-09-25 MED ORDER — HYDROMORPHONE HCL 1 MG/ML IJ SOLN
1.0000 mg | Freq: Once | INTRAMUSCULAR | Status: AC
  Administered 2021-09-25: 1 mg via INTRAVENOUS

## 2021-09-25 MED ORDER — SODIUM CHLORIDE 0.9 % IV SOLN: INTRAVENOUS | Status: AC

## 2021-09-25 NOTE — Patient Instructions (Signed)

## 2021-09-25 NOTE — Progress Notes (Signed)
?Hematology and Oncology Follow Up Visit ? ?Crystal Lee ?[id number] ?December 26, 1933 88 y.o. ?09/25/2021 ? ? 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  ?Current Therapy:  ?Xeloda 2000 mg po bid (14 on/7 off) -- sp cycle #6 - started on 07/29/2020  --    completed on 12/02/2020        ?Zometa 4 mg IV q. Year -- next dose on 11/2021 ?  ?Interim History:  Crystal Lee is here today for follow-up.  Unfortunately, she is not doing all that well.  She recent was discharged from the hospital on Monday.  She had surgery for an abdominal wall hernia.  This apparently did not go as well as expected.  She is in the hospital for a few days.  She is still having issues.  She is having diarrhea.  Has been some abdominal pain. ? ?She has been taking some Imodium for the diarrhea.  She said that she send in some samples today.  I would have to believe that the samples are for C. difficile. ? ?She has had no fever.  There is been no bleeding.  She has had no urinary issues. ? ?There is been no problems with cough.  She has had no shortness of breath.  There is been a little bit of swelling in the legs.  She has had no rashes. ? ?Overall, I would have to say that her performance status is probably ECOG 2.   ? ?Medications:  ?Allergies as of 09/25/2021   ? ?   Reactions  ? Darvon [propoxyphene] Nausea And Vomiting, Palpitations  ? Darvocet Causes Sweats  ? Adhesive [tape] Rash  ? Morphine And Related Nausea And Vomiting  ? ?  ? ?  ?Medication List  ?  ? ?  ? Accurate as of September 25, 2021 12:55 PM. If you have any questions, ask your nurse or doctor.  ?  ?  ? ?  ? ?aspirin EC 81 MG tablet ?Take 81 mg by mouth daily. Swallow whole. ?  ?atorvastatin 20 MG tablet ?Commonly known as: LIPITOR ?Take 1 tablet (20 mg total) by mouth daily. ?  ?cyclobenzaprine 5  MG tablet ?Commonly known as: FLEXERIL ?Take 1 tablet (5 mg total) by mouth at bedtime as needed for muscle spasms. ?  ?donepezil 10 MG tablet ?Commonly known as: ARICEPT ?Take half tablet (5 mg) daily for 2 weeks, then increase to the full tablet at 10 mg daily ?What changed:  ?how much to take ?how to take this ?when to take this ?additional instructions ?  ?estradiol 0.1 MG/GM vaginal cream ?Commonly known as: ESTRACE VAGINAL ?Apply 1gm vaginally 1-3x a week as needed for comfort ?What changed:  ?how much to take ?how to take this ?when to take this ?additional instructions ?  ?HYDROcodone-acetaminophen 10-325 MG tablet ?Commonly known as: NORCO ?Take 1 tablet by mouth every 6 (six) hours as needed. ?What changed: reasons to take this ?  ?losartan 25 MG tablet ?Commonly known as: COZAAR ?Take 1 tablet (25 mg total) by mouth daily. ?  ?Multivitamin Adults Tabs ?Take 1 tablet by mouth daily. ?  ?nitrofurantoin (macrocrystal-monohydrate) 100 MG capsule ?Commonly known as: Macrobid ?Take 1 capsule (100 mg total) by mouth daily. ?  ?oxyCODONE 5 MG immediate release tablet ?Commonly known as: Oxy IR/ROXICODONE ?Take 1 tablet (5 mg total) by mouth every 6 (six) hours as needed  for moderate pain or severe pain. ?  ?phenazopyridine 200 MG tablet ?Commonly known as: Pyridium ?Take 1 tablet (200 mg total) by mouth 3 (three) times daily as needed for pain. ?  ?vitamin B-12 1000 MCG tablet ?Commonly known as: CYANOCOBALAMIN ?Take 1,000 mcg by mouth daily. ?  ? ?  ? ? ?Allergies:  ?Allergies  ?Allergen Reactions  ? Darvon [Propoxyphene] Nausea And Vomiting and Palpitations  ?  Darvocet Causes Sweats  ? Adhesive [Tape] Rash  ? Morphine And Related Nausea And Vomiting  ? ? ?Past Medical History, Surgical history, Social history, and Family History were reviewed and updated. ? ?Review of Systems: ?Review of Systems  ?Constitutional: Negative.   ?HENT: Negative.    ?Eyes: Negative.   ?Respiratory: Negative.    ?Cardiovascular:  Negative.   ?Gastrointestinal: Negative.   ?Genitourinary: Negative.   ?Musculoskeletal: Negative.   ?Skin: Negative.   ?Neurological: Negative.   ?Endo/Heme/Allergies: Negative.   ?Psychiatric/Behavioral: Negative.    ? ? ?Physical Exam: ? height is $RemoveB'5\' 6"'oSIDQizz$  (1.676 m) and weight is 201 lb 1.3 oz (91.2 kg). Her oral temperature is 98.2 ?F (36.8 ?C). Her blood pressure is 147/64 (abnormal) and her pulse is 79. Her respiration is 20 and oxygen saturation is 98%.  ? ?Wt Readings from Last 3 Encounters:  ?09/25/21 201 lb 1.3 oz (91.2 kg)  ?09/10/21 205 lb 1.6 oz (93 kg)  ?09/01/21 206 lb (93.4 kg)  ? ? ?Physical Exam ?Vitals reviewed.  ?HENT:  ?   Head: Normocephalic and atraumatic.  ?Eyes:  ?   Pupils: Pupils are equal, round, and reactive to light.  ?Cardiovascular:  ?   Rate and Rhythm: Normal rate and regular rhythm.  ?   Heart sounds: Normal heart sounds.  ?Pulmonary:  ?   Effort: Pulmonary effort is normal.  ?   Breath sounds: Normal breath sounds.  ?Abdominal:  ?   General: Bowel sounds are normal.  ?   Palpations: Abdomen is soft.  ?   Comments: Her abdomen is soft.  She has a healing laparotomy scar.  There is no erythema.  Her bowel sounds are decreased.  She has no obvious fluid wave.   ?Musculoskeletal:     ?   General: No tenderness or deformity. Normal range of motion.  ?   Cervical back: Normal range of motion.  ?Lymphadenopathy:  ?   Cervical: No cervical adenopathy.  ?Skin: ?   General: Skin is warm and dry.  ?   Findings: No erythema or rash.  ?Neurological:  ?   Mental Status: She is alert and oriented to person, place, and time.  ?Psychiatric:     ?   Behavior: Behavior normal.     ?   Thought Content: Thought content normal.     ?   Judgment: Judgment normal.  ? ? ?Lab Results  ?Component Value Date  ? WBC 6.7 09/25/2021  ? HGB 13.0 09/25/2021  ? HCT 40.5 09/25/2021  ? MCV 92.3 09/25/2021  ? PLT 148 (L) 09/25/2021  ? ?Lab Results  ?Component Value Date  ? FERRITIN 45 06/09/2020  ? IRON 49 06/09/2020  ?  TIBC 320 06/09/2020  ? UIBC 271 06/09/2020  ? IRONPCTSAT 15 06/09/2020  ? ?Lab Results  ?Component Value Date  ? RETICCTPCT 1.6 06/09/2020  ? RBC 4.39 09/25/2021  ? ?No results found for: KPAFRELGTCHN, LAMBDASER, KAPLAMBRATIO ?Lab Results  ?Component Value Date  ? IGGSERUM 1,085 02/09/2017  ? IGMSERUM 336 (H) 02/09/2017  ? ?No  results found for: TOTALPROTELP, ALBUMINELP, A1GS, A2GS, BETS, BETA2SER, [place], MSPIKE, SPEI ?  Chemistry   ?   ?Component Value Date/Time  ? NA 138 09/25/2021 1157  ? NA 139 02/25/2020 1627  ? K 4.2 09/25/2021 1157  ? CL 104 09/25/2021 1157  ? CO2 28 09/25/2021 1157  ? BUN 7 (L) 09/25/2021 1157  ? BUN 11 02/25/2020 1627  ? CREATININE 0.62 09/25/2021 1157  ?    ?Component Value Date/Time  ? CALCIUM 9.5 09/25/2021 1157  ? ALKPHOS 101 09/25/2021 1157  ? AST 25 09/25/2021 1157  ? ALT 20 09/25/2021 1157  ? BILITOT 0.6 09/25/2021 1157  ?  ? ? ? ?Impression and Plan:  Crystal Lee is a very pleasant 88 yo caucasian female with history of bilateral stage Ia breast cancer diagnosed back in 1998. She now has locally advanced colon cancer stage IIIb with 3+ lymph nodes. ? ?She completed all of her adjuvant therapy in July. 2022. ? ?I feel bad that she had surgery.  I feel that she has had complications from this.  The incisional site looks okay. ? ?I think she might be a little bit dehydrated.  We will go ahead and give her some IV fluid today.  I will give her some Pepcid.  We will give her a little bit of IV Dilaudid.  She says she takes oxycodone which does not seem to help that much. ? ?Again, there is no evidence that she has any count of recurrent cancer.  I would think if that were the case, we would have been notified. ? ?I think she sees Dr. Ninfa Linden next week. ? ?We will have to probably see her back in another month or so.  Hopefully, when we see her back, she will be feeling better. ? ? ?[name], MD ?4/28/202312:55 PM  ?

## 2021-09-26 DIAGNOSIS — Z20822 Contact with and (suspected) exposure to covid-19: Secondary | ICD-10-CM | POA: Diagnosis not present

## 2021-09-28 ENCOUNTER — Telehealth: Payer: Self-pay | Admitting: *Deleted

## 2021-09-28 NOTE — Telephone Encounter (Signed)
Returned patient's call and she stated she is recovering from a 1 week hospital stay from hernia surgery. Per Zella Ball, patient's appointment for 09/29/21 was changed to phone visit ?

## 2021-09-28 NOTE — Telephone Encounter (Signed)
Mrs Rubalcava is requesting Zella Ball give her a call.  She cannot make her appt tomorrow. ?

## 2021-09-29 ENCOUNTER — Encounter: Payer: Self-pay | Admitting: Registered Nurse

## 2021-09-29 ENCOUNTER — Encounter: Payer: Medicare Other | Attending: Physical Medicine & Rehabilitation | Admitting: Registered Nurse

## 2021-09-29 VITALS — BP 147/64 | HR 79 | Ht 66.0 in | Wt 201.0 lb

## 2021-09-29 DIAGNOSIS — M7061 Trochanteric bursitis, right hip: Secondary | ICD-10-CM

## 2021-09-29 DIAGNOSIS — M2550 Pain in unspecified joint: Secondary | ICD-10-CM

## 2021-09-29 DIAGNOSIS — Z5181 Encounter for therapeutic drug level monitoring: Secondary | ICD-10-CM | POA: Diagnosis not present

## 2021-09-29 DIAGNOSIS — R202 Paresthesia of skin: Secondary | ICD-10-CM

## 2021-09-29 DIAGNOSIS — Z79891 Long term (current) use of opiate analgesic: Secondary | ICD-10-CM

## 2021-09-29 DIAGNOSIS — M961 Postlaminectomy syndrome, not elsewhere classified: Secondary | ICD-10-CM | POA: Diagnosis not present

## 2021-09-29 DIAGNOSIS — M62838 Other muscle spasm: Secondary | ICD-10-CM

## 2021-09-29 DIAGNOSIS — M170 Bilateral primary osteoarthritis of knee: Secondary | ICD-10-CM | POA: Diagnosis not present

## 2021-09-29 DIAGNOSIS — S32000S Wedge compression fracture of unspecified lumbar vertebra, sequela: Secondary | ICD-10-CM

## 2021-09-29 DIAGNOSIS — M546 Pain in thoracic spine: Secondary | ICD-10-CM | POA: Insufficient documentation

## 2021-09-29 DIAGNOSIS — R2990 Unspecified symptoms and signs involving the nervous system: Secondary | ICD-10-CM

## 2021-09-29 DIAGNOSIS — G8929 Other chronic pain: Secondary | ICD-10-CM | POA: Diagnosis not present

## 2021-09-29 DIAGNOSIS — G894 Chronic pain syndrome: Secondary | ICD-10-CM

## 2021-09-29 DIAGNOSIS — M7062 Trochanteric bursitis, left hip: Secondary | ICD-10-CM | POA: Insufficient documentation

## 2021-09-29 MED ORDER — HYDROCODONE-ACETAMINOPHEN 10-325 MG PO TABS: 1.0000 | ORAL_TABLET | Freq: Four times a day (QID) | ORAL | 0 refills | Status: DC | PRN

## 2021-09-29 NOTE — Progress Notes (Signed)
? ?Subjective:  ? ? Patient ID: Crystal Lee, female    DOB: 1933/10/04, 88 y.o.   MRN: [id number] ? ?HPI: Crystal Lee is a 88 y.o. female who appointment was change to a telephone visit, she called the office on 09/28/2021 with reports of recent discharge and she has diarrhea as well. Crystal Lee and I have  discussed the limitations of evaluation and management by telemedicine and the availability of in person appointments. The patient expressed understanding and agreed to proceed. ?She states her pain is located in her mid- lower back, bilateral hips, bilateral lower extremities with numbness and tingling. Also reports bilateral knee pain and generalized joint pain. She rates her pain 8. Her current exercise regime is walking with her walker.  ? ?Crystal Lee was admitted to [facility] on 09/16/2021 and discharged on 09/21/2021: for  ?OPEN INCISIONAL HERNIA REPAIR WITH MESH, discharge summary reviewed.  ? ?Crystal Lee Morphine equivalent is 40.00 MME.   Last Oral Swab was Performed 05/14/2021, it was consistent.  ?  ?Pain Inventory ?Average Pain 8 ?Pain Right Now 8 ?My pain is constant and can't describe the pain ? ?In the last 24 hours, has pain interfered with the following? ?General activity 0 ?Relation with others 0 ?Enjoyment of life 0 ?What TIME of day is your pain at its worst? varies ?Sleep (in general) Fair ? ?Pain is worse with: walking and some activites ?Pain improves with: rest and medication ?Relief from Meds: 4 ? ?Family History  ?Problem Relation Age of Onset  ? Other Mother   ?     complications from flu  ? Other Father   ?     unsure of cause  ? Colon cancer Neg Hx   ? ?Social History  ? ?Socioeconomic History  ? Marital status: Widowed  ?  Spouse name: Not on file  ? Number of children: 3  ? Years of education: 16 years  ? Highest education level: Not on file  ?Occupational History  ? Occupation: Retired  ?Tobacco Use  ? Smoking status: Former  ?  Packs/day: 0.50  ?  Years: 20.00  ?  Pack years:  10.00  ?  Types: Cigarettes  ?  Quit date: 02/03/1976  ?  Years since quitting: 45.6  ? Smokeless tobacco: Never  ?Vaping Use  ? Vaping Use: Never used  ?Substance and Sexual Activity  ? Alcohol use: No  ? Drug use: No  ? Sexual activity: Not Currently  ?  Birth control/protection: Post-menopausal  ?Other Topics Concern  ? Not on file  ?Social History Narrative  ? Lives at home with husband.  ? Right-handed.  ? No caffeine use.  ? ?Social Determinants of Health  ? ?Financial Resource Strain: Low Risk   ? Difficulty of Paying Living Expenses: Not hard at all  ?Food Insecurity: No Food Insecurity  ? Worried About Charity fundraiser in the Last Year: Never true  ? Ran Out of Food in the Last Year: Never true  ?Transportation Needs: No Transportation Needs  ? Lack of Transportation (Medical): No  ? Lack of Transportation (Non-Medical): No  ?Physical Activity: Not on file  ?Stress: Not on file  ?Social Connections: Moderately Isolated  ? Frequency of Communication with Friends and Family: More than three times a week  ? Frequency of Social Gatherings with Friends and Family: More than three times a week  ? Attends Religious Services: More than 4 times per year  ? Active Member of Clubs or Organizations:  No  ? Attends Archivist Meetings: Never  ? Marital Status: Widowed  ? ?Past Surgical History:  ?Procedure Laterality Date  ? 7 HOUR [place] STUDY N/A 06/21/2018  ? Procedure: [place] STUDY;  Surgeon: Lavena Bullion, DO;  Location: [facility];  Service: Gastroenterology;  Laterality: N/A;  with impedance  ? ABDOMINAL HYSTERECTOMY  2010  ? ANAL RECTAL MANOMETRY N/A 09/19/2019  ? Procedure: ANO RECTAL MANOMETRY;  Surgeon: Mauri Pole, MD;  Location: [facility];  Service: Endoscopy;  Laterality: N/A;  ? APPENDECTOMY  1949  ? [place]  ? BIOPSY  06/10/2020  ? Procedure: BIOPSY;  Surgeon: Lavena Bullion, DO;  Location: [facility];  Service: Gastroenterology;;  EGD and COLON  ? BREAST  SURGERY    ? CATARACT EXTRACTION Bilateral 2011  ? CHOLECYSTECTOMY    ? COLON RESECTION N/A 06/12/2020  ? Procedure: LAPAROSCOPIC COLON RESECTION;  Surgeon: [name], MD;  Location: [facility];  Service: General;  Laterality: N/A;  ? COLONOSCOPY WITH PROPOFOL N/A 06/10/2020  ? Procedure: COLONOSCOPY WITH PROPOFOL;  Surgeon: Lavena Bullion, DO;  [facility];  Service: Gastroenterology;  Laterality: N/A;  ? ESOPHAGEAL MANOMETRY N/A 06/21/2018  ? Procedure: ESOPHAGEAL MANOMETRY (EM);  Surgeon: Lavena Bullion, DO;  [facility];  Service: Gastroenterology;  Laterality: N/A;  ? ESOPHAGOGASTRODUODENOSCOPY (EGD) WITH PROPOFOL N/A 06/10/2020  ? Procedure: ESOPHAGOGASTRODUODENOSCOPY (EGD) WITH PROPOFOL;  Surgeon: Lavena Bullion, DO;  [facility];  Service: Gastroenterology;  Laterality: N/A;  ? GALLBLADDER SURGERY  2015  ? INCISIONAL HERNIA REPAIR N/A 09/16/2021  ? Procedure: OPEN INCISIONAL HERNIA REPAIR WITH MESH;  Surgeon: [name], MD;  Location: [facility];  Service: General;  Laterality: N/A;  ? JOINT REPLACEMENT    ? RT TOTAL HIP / RT TOTAL KNEE  ? MASTECTOMY  1998  ? BILATERAL   ? [place] IMPEDANCE STUDY  06/21/2018  ? Procedure: [place] IMPEDANCE STUDY;  Surgeon: Lavena Bullion, DO;  [facility];  Service: Gastroenterology;;  ? POLYPECTOMY  06/10/2020  ? Procedure: POLYPECTOMY;  Surgeon: Lavena Bullion, DO;  [facility];  Service: Gastroenterology;;  ? SKIN CANCER EXCISION  2016  ? RT SIDE OF NOSE  ? SUBMUCOSAL TATTOO INJECTION  06/10/2020  ? Procedure: SUBMUCOSAL TATTOO INJECTION;  Surgeon: Lavena Bullion, DO;  [facility];  Service: Gastroenterology;;  ? TONSILLECTOMY  1953  ? TOTAL HIP ARTHROPLASTY  2010  ? RIGHT  ? TOTAL HIP ARTHROPLASTY Left 05/09/2015  ? Procedure: LEFT TOTAL HIP ARTHROPLASTY ANTERIOR APPROACH;  Surgeon: Gaynelle Arabian, MD;  [facility];  Service: Orthopedics;  Laterality: Left;  ? TOTAL KNEE ARTHROPLASTY  2001   ? TUMOR REMOVAL  2012  ? ABDOMINAL - NON CANCEROUS  ? ?Past Surgical History:  ?Procedure Laterality Date  ? 60 HOUR [place] STUDY N/A 06/21/2018  ? Procedure: [place] STUDY;  Surgeon: Lavena Bullion, DO;  [facility];  Service: Gastroenterology;  Laterality: N/A;  with impedance  ? ABDOMINAL HYSTERECTOMY  2010  ? ANAL RECTAL MANOMETRY N/A 09/19/2019  ? Procedure: ANO RECTAL MANOMETRY;  Surgeon: Mauri Pole, MD;  [facility];  Service: Endoscopy;  Laterality: N/A;  ? APPENDECTOMY  1949  ? [place]  ? BIOPSY  06/10/2020  ? Procedure: BIOPSY;  Surgeon: Lavena Bullion, DO;  [facility];  Service: Gastroenterology;;  EGD and COLON  ? BREAST SURGERY    ? CATARACT  EXTRACTION Bilateral 2011  ? CHOLECYSTECTOMY    ? COLON RESECTION N/A 06/12/2020  ? Procedure: LAPAROSCOPIC COLON RESECTION;  Surgeon: [name], MD;  [facility];  Service: General;  Laterality: N/A;  ? COLONOSCOPY WITH PROPOFOL N/A 06/10/2020  ? Procedure: COLONOSCOPY WITH PROPOFOL;  Surgeon: Lavena Bullion, DO;  [facility];  Service: Gastroenterology;  Laterality: N/A;  ? ESOPHAGEAL MANOMETRY N/A 06/21/2018  ? Procedure: ESOPHAGEAL MANOMETRY (EM);  Surgeon: Lavena Bullion, DO;  [facility];  Service: Gastroenterology;  Laterality: N/A;  ? ESOPHAGOGASTRODUODENOSCOPY (EGD) WITH PROPOFOL N/A 06/10/2020  ? Procedure: ESOPHAGOGASTRODUODENOSCOPY (EGD) WITH PROPOFOL;  Surgeon: Lavena Bullion, DO;  [facility];  Service: Gastroenterology;  Laterality: N/A;  ? GALLBLADDER SURGERY  2015  ? INCISIONAL HERNIA REPAIR N/A 09/16/2021  ? Procedure: OPEN INCISIONAL HERNIA REPAIR WITH MESH;  Surgeon: [name], MD;  Location: [facility];  Service: General;  Laterality: N/A;  ? JOINT REPLACEMENT    ? RT TOTAL HIP / RT TOTAL KNEE  ? MASTECTOMY  1998  ? BILATERAL   ? [place] IMPEDANCE STUDY  06/21/2018  ? Procedure: [place] IMPEDANCE STUDY;  Surgeon: Lavena Bullion, DO;   [facility];  Service: Gastroenterology;;  ? POLYPECTOMY  06/10/2020  ? Procedure: POLYPECTOMY;  Surgeon: Lavena Bullion, DO;  [facility];  Service: Gastroenterology;;  ? SKIN CANCER EXC

## 2021-10-01 DIAGNOSIS — H16103 Unspecified superficial keratitis, bilateral: Secondary | ICD-10-CM | POA: Diagnosis not present

## 2021-10-01 DIAGNOSIS — H0100B Unspecified blepharitis left eye, upper and lower eyelids: Secondary | ICD-10-CM | POA: Diagnosis not present

## 2021-10-01 DIAGNOSIS — H1820 Unspecified corneal edema: Secondary | ICD-10-CM | POA: Diagnosis not present

## 2021-10-01 DIAGNOSIS — H0100A Unspecified blepharitis right eye, upper and lower eyelids: Secondary | ICD-10-CM | POA: Diagnosis not present

## 2021-10-02 DIAGNOSIS — Z20822 Contact with and (suspected) exposure to covid-19: Secondary | ICD-10-CM | POA: Diagnosis not present

## 2021-10-02 DIAGNOSIS — Z09 Encounter for follow-up examination after completed treatment for conditions other than malignant neoplasm: Secondary | ICD-10-CM | POA: Diagnosis not present

## 2021-10-13 DIAGNOSIS — L570 Actinic keratosis: Secondary | ICD-10-CM | POA: Diagnosis not present

## 2021-10-16 ENCOUNTER — Encounter (HOSPITAL_COMMUNITY): Payer: Self-pay | Admitting: Surgery

## 2021-10-20 NOTE — Progress Notes (Unsigned)
[facility] [place] [address], [address] [place], [place] [address] [phone] Fax [phone]  Date:  10/21/2021   Name:  Crystal Lee   DOB:  03/13/1934   MRN:  [id number]  PCP:  ,  C, MD    Chief Complaint: No chief complaint on file.   History of Present Illness:  Crystal Lee is a 88 y.o. 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 Year -- next dose on 11/2021              Patient Active Problem List   Diagnosis Date Noted   Incisional hernia 09/16/2021   Pain 05/05/2021   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 behavioral disturbance, psychotic disturbance, mood disturbance, or anxiety (HCC) 05/05/2021   Stroke-like symptoms 03/25/2021   Sleep apnea    Rosacea    PONV (postoperative nausea and vomiting)    Nocturia    Neuropathy    Melanoma (HCC)    Hypertension    Hyperlipidemia    Fractured hip (HCC)    Difficulty sleeping    Complication of anesthesia    Cancer (HCC)    Arthritis    Malignant neoplasm of descending colon (HCC)    Gastric polyps    Colonic mass    Diverticulosis of colon without hemorrhage    Adenomatous polyp of sigmoid colon    Acute lower GI bleeding 06/09/2020   Lower GI bleed  06/08/2020   Acute blood loss anemia 06/08/2020   HTN (hypertension) 06/08/2020   Constipation    Encounter for orthopedic follow-up care 06/18/2019   Urinary tract infection 04/20/2019   Acute pain of right wrist 03/28/2019   Tenosynovitis, wrist 03/28/2019   Acquired trigger finger of right little finger 01/15/2019   Carpal tunnel syndrome of right wrist 01/15/2019   Aftercare 08/17/2018   Lumbar post-laminectomy syndrome 07/31/2018   Heartburn    Fracture of superior pubic ramus (HCC) 06/16/2018   Radial styloid tenosynovitis of left hand 05/16/2018   Pain of left hand 04/20/2018   Osteopenia 04/15/2018   Dyslipidemia 02/02/2018   GERD (gastroesophageal reflux disease) 02/02/2018   Trochanteric bursitis of left hip 12/23/2017   Primary osteoarthritis of left knee 10/17/2017   History of right knee joint replacement 10/17/2017   Pain in both lower extremities 08/02/2017   Stage 1 breast cancer, ER+, right (HCC) 07/04/2017   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   Pain in right hand 06/10/2017   Trigger finger of right hand 06/10/2017   Paresthesia 02/09/2017   Low back pain 02/09/2017   Gait abnormality 02/09/2017   OA (osteoarthritis) of hip 05/09/2015    Past Medical History:  Diagnosis Date   Arthritis    Cancer (HCC)      HX BREAST CANCER/ SKIN CANCER   Colon cancer (HCC)    Complication of anesthesia    N/V WITH MORPHINE   Difficulty sleeping    Fractured hip (HCC)    LEFT - AUG 2016   GERD (gastroesophageal reflux disease)    Hyperlipidemia    Hypertension    Melanoma (HCC)    Neuropathy    Nocturia    Osteopenia    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   PONV (postoperative nausea and vomiting)    PT STATES MORPHINE CAUSED N/V   Rosacea    Sleep apnea    Stage 1 breast cancer, ER+, right (HCC) 07/04/2017    Past Surgical History:  Procedure Laterality Date   24 HOUR PH STUDY N/A 06/21/2018   Procedure: 24 HOUR PH STUDY;   Surgeon: Cirigliano, Vito V, DO;  Location: [facility];  Service: Gastroenterology;  Laterality: N/A;  with impedance   ABDOMINAL HYSTERECTOMY  2010   ANAL RECTAL MANOMETRY N/A 09/19/2019   Procedure: ANO RECTAL MANOMETRY;  Surgeon: Nandigam, Kavitha V, MD;  Location: [facility];  Service: Endoscopy;  Laterality: N/A;   APPENDECTOMY  1949   BACK SURGERY  1973   BIOPSY  06/10/2020   Procedure: BIOPSY;  Surgeon: Cirigliano, Vito V, DO;  Location: [facility];  Service: Gastroenterology;;  EGD and COLON   BREAST SURGERY     CATARACT EXTRACTION Bilateral 2011   CHOLECYSTECTOMY     COLON RESECTION N/A 06/12/2020   Procedure: LAPAROSCOPIC COLON RESECTION;  Surgeon: Blackman, Douglas, MD;  Location: [facility];  Service: General;  Laterality: N/A;   COLONOSCOPY WITH PROPOFOL N/A 06/10/2020   Procedure: COLONOSCOPY WITH PROPOFOL;  Surgeon: Cirigliano, Vito V, DO;  Location: [facility];  Service: Gastroenterology;  Laterality: N/A;   ESOPHAGEAL MANOMETRY N/A 06/21/2018   Procedure: ESOPHAGEAL MANOMETRY (EM);  Surgeon: Cirigliano, Vito V, DO;  Location: [facility];  Service: Gastroenterology;  Laterality: N/A;   ESOPHAGOGASTRODUODENOSCOPY (EGD) WITH PROPOFOL N/A 06/10/2020   Procedure: ESOPHAGOGASTRODUODENOSCOPY (EGD) WITH PROPOFOL;  Surgeon: Cirigliano, Vito V, DO;  Location: [facility];  Service: Gastroenterology;  Laterality: N/A;   GALLBLADDER SURGERY  2015   INCISIONAL HERNIA REPAIR N/A 09/16/2021   Procedure: OPEN INCISIONAL HERNIA REPAIR WITH MESH;  Surgeon: Blackman, Douglas, MD;  Location: [facility];  Service: General;  Laterality: N/A;   JOINT REPLACEMENT     RT TOTAL HIP / RT TOTAL KNEE   MASTECTOMY  1998   BILATERAL    PH IMPEDANCE STUDY  06/21/2018   Procedure: PH IMPEDANCE STUDY;  Surgeon: Cirigliano, Vito V, DO;  Location: [facility];  Service: Gastroenterology;;   POLYPECTOMY  06/10/2020   Procedure: POLYPECTOMY;  Surgeon: Cirigliano, Vito V, DO;  Location: [facility];  Service:  Gastroenterology;;   SKIN CANCER EXCISION  2016   RT SIDE OF NOSE   SUBMUCOSAL TATTOO INJECTION  06/10/2020   Procedure: SUBMUCOSAL TATTOO INJECTION;  Surgeon: Cirigliano, Vito V, DO;  Location: [facility];  Service: Gastroenterology;;   TONSILLECTOMY  1953   TOTAL HIP ARTHROPLASTY  2010   RIGHT   TOTAL HIP ARTHROPLASTY Left 05/09/2015   Procedure: LEFT TOTAL HIP ARTHROPLASTY ANTERIOR APPROACH;  Surgeon: Frank Aluisio, MD;  Location: [facility];  Service: Orthopedics;  Laterality: Left;   TOTAL KNEE ARTHROPLASTY  2001   TUMOR REMOVAL  2012   ABDOMINAL - NON CANCEROUS    Social History   Tobacco Use   Smoking status: Former    Packs/day: 0.50    Years: 20.00      Pack years: 10.00    Types: Cigarettes    Quit date: 02/03/1976    Years since quitting: 45.7   Smokeless tobacco: Never  Vaping Use   Vaping Use: Never used  Substance Use Topics   Alcohol use: No   Drug use: No    Family History  Problem Relation Age of Onset   Other Mother        complications from flu   Other Father        unsure of cause   Colon cancer Neg Hx     Allergies  Allergen Reactions   Darvon [Propoxyphene] Nausea And Vomiting and Palpitations    Darvocet Causes Sweats   Adhesive [Tape] Rash   Morphine And Related Nausea And Vomiting    Medication list has been reviewed and updated.  Current Outpatient Medications on File Prior to Visit  Medication Sig Dispense Refill   aspirin EC 81 MG tablet Take 81 mg by mouth daily. Swallow whole.     atorvastatin (LIPITOR) 20 MG tablet Take 1 tablet (20 mg total) by mouth daily. 30 tablet 0   cyclobenzaprine (FLEXERIL) 5 MG tablet Take 1 tablet (5 mg total) by mouth at bedtime as needed for muscle spasms. 30 tablet 2   donepezil (ARICEPT) 10 MG tablet Take half tablet (5 mg) daily for 2 weeks, then increase to the full tablet at 10 mg daily (Patient taking differently: Take 10 mg by mouth daily.) 30 tablet 11   estradiol (ESTRACE VAGINAL) 0.1 MG/GM vaginal  cream Apply 1gm vaginally 1-3x a week as needed for comfort (Patient taking differently: Place 1 g vaginally See admin instructions. 1g vaginally once to three times a week as needed for comfort with UTI) 42.5 g 12   HYDROcodone-acetaminophen (NORCO) 10-325 MG tablet Take 1 tablet by mouth every 6 (six) hours as needed. 120 tablet 0   losartan (COZAAR) 25 MG tablet Take 1 tablet (25 mg total) by mouth daily. 90 tablet 3   Multiple Vitamins-Minerals (MULTIVITAMIN ADULTS) TABS Take 1 tablet by mouth daily.     nitrofurantoin, macrocrystal-monohydrate, (MACROBID) 100 MG capsule Take 1 capsule (100 mg total) by mouth daily. 90 capsule 1   phenazopyridine (PYRIDIUM) 200 MG tablet Take 1 tablet (200 mg total) by mouth 3 (three) times daily as needed for pain. 40 tablet 0   vitamin B-12 (CYANOCOBALAMIN) 1000 MCG tablet Take 1,000 mcg by mouth daily.     No current facility-administered medications on file prior to visit.    Review of Systems:  As per HPI- otherwise negative.   Physical Examination: There were no vitals filed for this visit. There were no vitals filed for this visit. There is no height or weight on file to calculate BMI. Ideal Body Weight:    GEN: no acute distress. HEENT: Atraumatic, Normocephalic.  Ears and Nose: No external deformity. CV: RRR, No M/G/R. No JVD. No thrill. No extra heart sounds. PULM: CTA B, no wheezes, crackles, rhonchi. No retractions. No resp. distress. No accessory muscle use. ABD: S, NT, ND, +BS. No rebound. No HSM. EXTR: No c/c/e PSYCH: Normally interactive. Conversant.    Assessment and Plan: ***  Signed Lamar Blinks, MD

## 2021-10-21 ENCOUNTER — Ambulatory Visit (INDEPENDENT_AMBULATORY_CARE_PROVIDER_SITE_OTHER): Payer: Medicare Other | Admitting: Family Medicine

## 2021-10-21 VITALS — BP 122/60 | HR 72 | Temp 97.9°F | Resp 18 | Ht 66.0 in | Wt 200.2 lb

## 2021-10-21 DIAGNOSIS — E559 Vitamin D deficiency, unspecified: Secondary | ICD-10-CM | POA: Diagnosis not present

## 2021-10-21 DIAGNOSIS — R2990 Unspecified symptoms and signs involving the nervous system: Secondary | ICD-10-CM | POA: Diagnosis not present

## 2021-10-21 DIAGNOSIS — I10 Essential (primary) hypertension: Secondary | ICD-10-CM

## 2021-10-21 DIAGNOSIS — E782 Mixed hyperlipidemia: Secondary | ICD-10-CM | POA: Diagnosis not present

## 2021-10-21 DIAGNOSIS — R350 Frequency of micturition: Secondary | ICD-10-CM | POA: Diagnosis not present

## 2021-10-21 DIAGNOSIS — R052 Subacute cough: Secondary | ICD-10-CM

## 2021-10-21 MED ORDER — LOSARTAN POTASSIUM 25 MG PO TABS: 25.0000 mg | ORAL_TABLET | Freq: Every day | ORAL | 3 refills | Status: DC

## 2021-10-21 MED ORDER — CHERATUSSIN AC 100-10 MG/5ML PO SOLN: 5.0000 mL | Freq: Three times a day (TID) | ORAL | 0 refills | Status: DC | PRN

## 2021-10-21 MED ORDER — PHENAZOPYRIDINE HCL 200 MG PO TABS: 200.0000 mg | ORAL_TABLET | Freq: Three times a day (TID) | ORAL | 3 refills | Status: DC | PRN

## 2021-10-21 MED ORDER — ATORVASTATIN CALCIUM 20 MG PO TABS: 20.0000 mg | ORAL_TABLET | Freq: Every day | ORAL | 3 refills | Status: DC

## 2021-10-21 MED ORDER — VITAMIN D3 1.25 MG (50000 UT) PO CAPS: ORAL_CAPSULE | ORAL | 3 refills | Status: DC

## 2021-10-21 MED ORDER — BENZONATATE 100 MG PO CAPS: 100.0000 mg | ORAL_CAPSULE | Freq: Three times a day (TID) | ORAL | 0 refills | Status: DC

## 2021-10-21 NOTE — Patient Instructions (Addendum)
Good to see you today-  Please let me know if the cough does not clear up in the next week or so, sooner if getting worse  I will check your vitamin D today Please see me in 4 months for recheck

## 2021-10-22 ENCOUNTER — Encounter: Payer: Self-pay | Admitting: Hematology & Oncology

## 2021-10-22 ENCOUNTER — Inpatient Hospital Stay (HOSPITAL_BASED_OUTPATIENT_CLINIC_OR_DEPARTMENT_OTHER): Payer: Medicare Other | Admitting: Hematology & Oncology

## 2021-10-22 ENCOUNTER — Encounter: Payer: Self-pay | Admitting: Family Medicine

## 2021-10-22 ENCOUNTER — Inpatient Hospital Stay: Payer: Medicare Other | Attending: Hematology & Oncology

## 2021-10-22 ENCOUNTER — Other Ambulatory Visit: Payer: Self-pay

## 2021-10-22 VITALS — BP 147/53 | HR 72 | Temp 98.2°F | Resp 16

## 2021-10-22 DIAGNOSIS — Z7982 Long term (current) use of aspirin: Secondary | ICD-10-CM | POA: Insufficient documentation

## 2021-10-22 DIAGNOSIS — M818 Other osteoporosis without current pathological fracture: Secondary | ICD-10-CM | POA: Insufficient documentation

## 2021-10-22 DIAGNOSIS — C186 Malignant neoplasm of descending colon: Secondary | ICD-10-CM

## 2021-10-22 DIAGNOSIS — R197 Diarrhea, unspecified: Secondary | ICD-10-CM | POA: Insufficient documentation

## 2021-10-22 DIAGNOSIS — Z79899 Other long term (current) drug therapy: Secondary | ICD-10-CM | POA: Diagnosis not present

## 2021-10-22 DIAGNOSIS — Z853 Personal history of malignant neoplasm of breast: Secondary | ICD-10-CM | POA: Diagnosis not present

## 2021-10-22 DIAGNOSIS — C184 Malignant neoplasm of transverse colon: Secondary | ICD-10-CM | POA: Insufficient documentation

## 2021-10-22 DIAGNOSIS — E860 Dehydration: Secondary | ICD-10-CM | POA: Insufficient documentation

## 2021-10-22 LAB — CBC WITH DIFFERENTIAL (CANCER CENTER ONLY)
Abs Immature Granulocytes: 0.01 10*3/uL (ref 0.00–0.07)
Basophils Absolute: 0 10*3/uL (ref 0.0–0.1)
Basophils Relative: 0 %
Eosinophils Absolute: 0.4 10*3/uL (ref 0.0–0.5)
Eosinophils Relative: 6 %
HCT: 39.6 % (ref 36.0–46.0)
Hemoglobin: 12.7 g/dL (ref 12.0–15.0)
Immature Granulocytes: 0 %
Lymphocytes Relative: 32 %
Lymphs Abs: 2.2 10*3/uL (ref 0.7–4.0)
MCH: 29.7 pg (ref 26.0–34.0)
MCHC: 32.1 g/dL (ref 30.0–36.0)
MCV: 92.5 fL (ref 80.0–100.0)
Monocytes Absolute: 0.5 10*3/uL (ref 0.1–1.0)
Monocytes Relative: 7 %
Neutro Abs: 3.9 10*3/uL (ref 1.7–7.7)
Neutrophils Relative %: 55 %
Platelet Count: 126 10*3/uL — ABNORMAL LOW (ref 150–400)
RBC: 4.28 MIL/uL (ref 3.87–5.11)
RDW: 13 % (ref 11.5–15.5)
WBC Count: 6.9 10*3/uL (ref 4.0–10.5)
nRBC: 0 % (ref 0.0–0.2)

## 2021-10-22 LAB — CMP (CANCER CENTER ONLY)
ALT: 31 U/L (ref 0–44)
AST: 41 U/L (ref 15–41)
Albumin: 4 g/dL (ref 3.5–5.0)
Alkaline Phosphatase: 118 U/L (ref 38–126)
Anion gap: 7 (ref 5–15)
BUN: 10 mg/dL (ref 8–23)
CO2: 27 mmol/L (ref 22–32)
Calcium: 9.6 mg/dL (ref 8.9–10.3)
Chloride: 104 mmol/L (ref 98–111)
Creatinine: 0.68 mg/dL (ref 0.44–1.00)
GFR, Estimated: >60 mL/min (ref >60)
Glucose, Bld: 106 mg/dL — ABNORMAL HIGH (ref 70–99)
Potassium: 4 mmol/L (ref 3.5–5.1)
Sodium: 138 mmol/L (ref 135–145)
Total Bilirubin: 0.6 mg/dL (ref 0.3–1.2)
Total Protein: 7.3 g/dL (ref 6.5–8.1)

## 2021-10-22 LAB — VITAMIN D 25 HYDROXY (VIT D DEFICIENCY, FRACTURES): VITD: 47.55 ng/mL (ref 30.00–100.00)

## 2021-10-22 LAB — CEA (IN HOUSE-CHCC): CEA (CHCC-In House): 2.87 ng/mL (ref 0.00–5.00)

## 2021-10-22 LAB — LACTATE DEHYDROGENASE: LDH: 158 U/L (ref 98–192)

## 2021-10-22 NOTE — Progress Notes (Signed)
Hematology and Oncology Follow Up Visit  Crystal Lee [id number] 04/13/1934 88 y.o. 10/22/2021   Principle Diagnosis:  Stage IIIB Adenocarcinoma of the transverse colon ([id number]) -- MMR proficient Bilateral stage Ia breast cancer-1998; thoracic adenopathy of undetermined significance Osteoporosis-aromatase inhibitor induced   Current Therapy:  Xeloda 2000 mg po bid (14 on/7 off) -- sp cycle #6 - started on 07/29/2020  --    completed on 12/02/2020        Zometa 4 mg IV q. Year -- next dose on 12/2021   Interim History:  [name] is here today for follow-up.  She is doing a lot better.  She feels better.  She had the hernia surgery before I seen her last time.  She had a tough time with hernia surgery when we saw her, I think we had to give her some IV fluids.  She was somewhat dehydrated.  She is having diarrhea.  Thankfully, all that has subsided.  She is more active now.  She really is looking forward to a daughter who lives in [place] visiting for a month.  She has had no bleeding.  There is no cough or shortness of breath.  She has had no leg swelling.  There is been no problems with rashes.  She has had no fever.  She has had no headache.  Overall, I would say performance status for now is [name].    Medications:  Allergies as of 10/22/2021       Reactions   Darvon [propoxyphene] Nausea And Vomiting, Palpitations   Darvocet Causes Sweats   Aricept [donepezil] Diarrhea   Adhesive [tape] Rash   Morphine And Related Nausea And Vomiting        Medication List        Accurate as of Oct 22, 2021 12:30 PM. If you have any questions, ask your nurse or doctor.          aspirin EC 81 MG tablet Take 81 mg by mouth daily. Swallow whole.   atorvastatin 20 MG tablet Commonly known as: LIPITOR Take 1 tablet (20 mg total) by mouth daily.   benzonatate 100 MG capsule Commonly known as: TESSALON Take 1 capsule (100 mg total) by mouth 3 (three) times daily.    Cheratussin AC 100-10 MG/5ML syrup Generic drug: guaiFENesin-codeine Take 5 mLs by mouth 3 (three) times daily as needed for cough.   cyclobenzaprine 5 MG tablet Commonly known as: FLEXERIL Take 1 tablet (5 mg total) by mouth at bedtime as needed for muscle spasms.   estradiol 0.1 MG/GM vaginal cream Commonly known as: ESTRACE VAGINAL Apply 1gm vaginally 1-3x a week as needed for comfort What changed:  how much to take how to take this when to take this additional instructions   HYDROcodone-acetaminophen 10-325 MG tablet Commonly known as: NORCO Take 1 tablet by mouth every 6 (six) hours as needed.   losartan 25 MG tablet Commonly known as: COZAAR Take 1 tablet (25 mg total) by mouth daily.   Multivitamin Adults Tabs Take 1 tablet by mouth daily.   nitrofurantoin (macrocrystal-monohydrate) 100 MG capsule Commonly known as: Macrobid Take 1 capsule (100 mg total) by mouth daily.   phenazopyridine 200 MG tablet Commonly known as: Pyridium Take 1 tablet (200 mg total) by mouth 3 (three) times daily as needed for pain. What changed: Another medication with the same name was removed. Continue taking this medication, and follow the directions you see here. Changed by: Josph Macho, MD  vitamin B-12 1000 MCG tablet Commonly known as: CYANOCOBALAMIN Take 1,000 mcg by mouth daily.   Vitamin D3 1.25 MG (50000 UT) Caps Take 1 weekly        Allergies:  Allergies  Allergen Reactions   Darvon [Propoxyphene] Nausea And Vomiting and Palpitations    Darvocet Causes Sweats   Aricept [Donepezil] Diarrhea   Adhesive [Tape] Rash   Morphine And Related Nausea And Vomiting    Past Medical History, Surgical history, Social history, and Family History were reviewed and updated.  Review of Systems: Review of Systems  Constitutional: Negative.   HENT: Negative.    Eyes: Negative.   Respiratory: Negative.    Cardiovascular: Negative.   Gastrointestinal: Negative.    Genitourinary: Negative.   Musculoskeletal: Negative.   Skin: Negative.   Neurological: Negative.   Endo/Heme/Allergies: Negative.   Psychiatric/Behavioral: Negative.      Physical Exam:  oral temperature is 98.2 F (36.8 C). Her blood pressure is 147/53 (abnormal) and her pulse is 72. Her respiration is 16 and oxygen saturation is 99%.   Wt Readings from Last 3 Encounters:  10/21/21 200 lb 3.2 oz (90.8 kg)  09/29/21 201 lb (91.2 kg)  09/25/21 201 lb 1.3 oz (91.2 kg)    Physical Exam Vitals reviewed.  HENT:     Head: Normocephalic and atraumatic.  Eyes:     Pupils: Pupils are equal, round, and reactive to light.  Cardiovascular:     Rate and Rhythm: Normal rate and regular rhythm.     Heart sounds: Normal heart sounds.  Pulmonary:     Effort: Pulmonary effort is normal.     Breath sounds: Normal breath sounds.  Abdominal:     General: Bowel sounds are normal.     Palpations: Abdomen is soft.     Comments: Her abdomen is soft.  She has a healing laparotomy scar.  There is no erythema.  Her bowel sounds are decreased.  She has no obvious fluid wave.   Musculoskeletal:        General: No tenderness or deformity. Normal range of motion.     Cervical back: Normal range of motion.  Lymphadenopathy:     Cervical: No cervical adenopathy.  Skin:    General: Skin is warm and dry.     Findings: No erythema or rash.  Neurological:     Mental Status: She is alert and oriented to person, place, and time.  Psychiatric:        Behavior: Behavior normal.        Thought Content: Thought content normal.        Judgment: Judgment normal.    Lab Results  Component Value Date   WBC 6.9 10/22/2021   HGB 12.7 10/22/2021   HCT 39.6 10/22/2021   MCV 92.5 10/22/2021   PLT 126 (L) 10/22/2021   Lab Results  Component Value Date   FERRITIN 45 06/09/2020   IRON 49 06/09/2020   TIBC 320 06/09/2020   UIBC 271 06/09/2020   IRONPCTSAT 15 06/09/2020   Lab Results  Component Value  Date   RETICCTPCT 1.6 06/09/2020   RBC 4.28 10/22/2021   No results found for: KPAFRELGTCHN, LAMBDASER, [facility] Lab Results  Component Value Date   IGGSERUM 1,085 02/09/2017   IGMSERUM 336 (H) 02/09/2017   No results found for: TOTALPROTELP, ALBUMINELP, A1GS, A2GS, BETS, BETA2SER, GAMS, MSPIKE, SPEI   Chemistry      Component Value Date/Time   NA 138 09/25/2021 1157   NA 139  02/25/2020 1627   K 4.2 09/25/2021 1157   CL 104 09/25/2021 1157   CO2 28 09/25/2021 1157   BUN 7 (L) 09/25/2021 1157   BUN 11 02/25/2020 1627   CREATININE 0.62 09/25/2021 1157      Component Value Date/Time   CALCIUM 9.5 09/25/2021 1157   ALKPHOS 101 09/25/2021 1157   AST 25 09/25/2021 1157   ALT 20 09/25/2021 1157   BILITOT 0.6 09/25/2021 1157       Impression and Plan:  Ms. Anspaugh is a very pleasant 88 yo caucasian female with history of bilateral stage Ia breast cancer diagnosed back in 1998. She has locally advanced colon cancer stage IIIb with 3+ lymph nodes.  She completed all of her adjuvant therapy in July. 2022.  I am just glad that she is feeling better.  She looks a whole lot better.  The wounds have healed nicely.  When we see her back, we will have to do her CT scan.  The last one I think was done back in January.  Also, when we see her back, she will have to have her Zometa.  We do that once a year.  I will plan to get her back in 3 months.  She is incredibly tough.  She has been through quite a bit.  She definitely has a strong constitution.   [name], MD 5/25/202312:30 PM

## 2021-10-23 ENCOUNTER — Telehealth: Payer: Self-pay | Admitting: Family Medicine

## 2021-10-23 DIAGNOSIS — E559 Vitamin D deficiency, unspecified: Secondary | ICD-10-CM

## 2021-10-23 NOTE — Telephone Encounter (Signed)
[facility] called regarding patients rx.. pharmacy states vitamin D3 is not covered within patients plan. Please advise

## 2021-10-27 MED ORDER — VITAMIN D3 1.25 MG (50000 UT) PO CAPS: ORAL_CAPSULE | ORAL | 3 refills | Status: DC

## 2021-10-27 NOTE — Telephone Encounter (Signed)
Spoke with ins and they could not give price of medication and I advised that I would see if pt would like to get at local pharmacy.  Spoke with patient and she stated that she would like to get wherever cheapest, rx sent to [facility].

## 2021-10-30 ENCOUNTER — Encounter: Payer: Medicare Other | Attending: Physical Medicine & Rehabilitation | Admitting: Registered Nurse

## 2021-10-30 ENCOUNTER — Encounter: Payer: Self-pay | Admitting: Registered Nurse

## 2021-10-30 VITALS — BP 138/78 | HR 68 | Ht 66.0 in | Wt 204.6 lb

## 2021-10-30 DIAGNOSIS — S32000S Wedge compression fracture of unspecified lumbar vertebra, sequela: Secondary | ICD-10-CM | POA: Insufficient documentation

## 2021-10-30 DIAGNOSIS — M961 Postlaminectomy syndrome, not elsewhere classified: Secondary | ICD-10-CM | POA: Insufficient documentation

## 2021-10-30 DIAGNOSIS — R202 Paresthesia of skin: Secondary | ICD-10-CM | POA: Diagnosis not present

## 2021-10-30 DIAGNOSIS — Z5181 Encounter for therapeutic drug level monitoring: Secondary | ICD-10-CM | POA: Diagnosis not present

## 2021-10-30 DIAGNOSIS — R2990 Unspecified symptoms and signs involving the nervous system: Secondary | ICD-10-CM | POA: Diagnosis not present

## 2021-10-30 DIAGNOSIS — Z79891 Long term (current) use of opiate analgesic: Secondary | ICD-10-CM | POA: Diagnosis not present

## 2021-10-30 DIAGNOSIS — G894 Chronic pain syndrome: Secondary | ICD-10-CM | POA: Insufficient documentation

## 2021-10-30 DIAGNOSIS — M2550 Pain in unspecified joint: Secondary | ICD-10-CM | POA: Diagnosis not present

## 2021-10-30 DIAGNOSIS — M170 Bilateral primary osteoarthritis of knee: Secondary | ICD-10-CM | POA: Insufficient documentation

## 2021-10-30 DIAGNOSIS — M62838 Other muscle spasm: Secondary | ICD-10-CM | POA: Diagnosis not present

## 2021-10-30 MED ORDER — HYDROCODONE-ACETAMINOPHEN 10-325 MG PO TABS: 1.0000 | ORAL_TABLET | Freq: Four times a day (QID) | ORAL | 0 refills | Status: DC | PRN

## 2021-10-30 NOTE — Progress Notes (Signed)
Subjective:    Patient ID: Crystal Lee, female    DOB: 1933-12-04, 88 y.o.   MRN: [id number]  HPI: Crystal Lee is a 88 y.o. female who returns for follow up appointment for chronic pain and medication refill. She states her pain is located in her lower back, bilateral lower extremities with tingling and numbness and bilateral knee pain. She rates her pain 4. Her current exercise regime is walking.  Crystal Lee Morphine equivalent is 40.00 MME.   Oral Swab was Performed Today.     Pain Inventory Average Pain 4 Pain Right Now 4 My pain is sharp, burning, tingling, and aching  In the last 24 hours, has pain interfered with the following? General activity 3 Relation with others 3 Enjoyment of life 3 What TIME of day is your pain at its worst? daytime, evening, and night Sleep (in general) Fair  Pain is worse with: walking, bending, standing, and some activites Pain improves with: rest, heat/ice, and medication Relief from Meds: 4  Family History  Problem Relation Age of Onset   Other Mother        complications from flu   Other Father        unsure of cause   Colon cancer Neg Hx    Social History   Socioeconomic History   Marital status: Widowed    Spouse name: Not on file   Number of children: 3   Years of education: 16 years   Highest education level: Not on file  Occupational History   Occupation: Retired  Tobacco Use   Smoking status: Former    Packs/day: 0.50    Years: 20.00    Pack years: 10.00    Types: Cigarettes    Quit date: 02/03/1976    Years since quitting: 45.7   Smokeless tobacco: Never  Vaping Use   Vaping Use: Never used  Substance and Sexual Activity   Alcohol use: No   Drug use: No   Sexual activity: Not Currently    Birth control/protection: Post-menopausal  Other Topics Concern   Not on file  Social History Narrative   Lives at home with husband.   Right-handed.   No caffeine use.   Social Determinants of Health   Financial  Resource Strain: Low Risk    Difficulty of Paying Living Expenses: Not hard at all  Food Insecurity: No Food Insecurity   Worried About Charity fundraiser in the Last Year: Never true   [place] in the Last Year: Never true  Transportation Needs: No Transportation Needs   Lack of Transportation (Medical): No   Lack of Transportation (Non-Medical): No  Physical Activity: Not on file  Stress: Not on file  Social Connections: Moderately Isolated   Frequency of Communication with Friends and Family: More than three times a week   Frequency of Social Gatherings with Friends and Family: More than three times a week   Attends Religious Services: More than 4 times per year   Active Member of [employer] or Organizations: No   Attends Archivist Meetings: Never   Marital Status: Widowed   Past Surgical History:  Procedure Laterality Date   24 HOUR [place] STUDY N/A 06/21/2018   Procedure: 24 HOUR PH STUDY;  Surgeon: Lavena Bullion, DO;  [facility];  Service: Gastroenterology;  Laterality: N/A;  with impedance   ABDOMINAL HYSTERECTOMY  2010   ANAL RECTAL MANOMETRY N/A 09/19/2019   Procedure: ANO RECTAL MANOMETRY;  Surgeon: Silverio Decamp,  Venia Minks, MD;  Location: [facility];  Service: Endoscopy;  Laterality: N/A;   [place]   BIOPSY  06/10/2020   Procedure: BIOPSY;  Surgeon: Lavena Bullion, DO;  [facility];  Service: Gastroenterology;;  EGD and COLON   BREAST SURGERY     CATARACT EXTRACTION Bilateral 2011   CHOLECYSTECTOMY     COLON RESECTION N/A 06/12/2020   Procedure: LAPAROSCOPIC COLON RESECTION;  Surgeon: [name], MD;  [facility];  Service: General;  Laterality: N/A;   COLONOSCOPY WITH PROPOFOL N/A 06/10/2020   Procedure: COLONOSCOPY WITH PROPOFOL;  Surgeon: Lavena Bullion, DO;  [facility];  Service: Gastroenterology;  Laterality: N/A;   ESOPHAGEAL MANOMETRY N/A 06/21/2018   Procedure:  ESOPHAGEAL MANOMETRY (EM);  Surgeon: Lavena Bullion, DO;  [facility];  Service: Gastroenterology;  Laterality: N/A;   ESOPHAGOGASTRODUODENOSCOPY (EGD) WITH PROPOFOL N/A 06/10/2020   Procedure: ESOPHAGOGASTRODUODENOSCOPY (EGD) WITH PROPOFOL;  Surgeon: Lavena Bullion, DO;  [facility];  Service: Gastroenterology;  Laterality: N/A;   GALLBLADDER SURGERY  2015   INCISIONAL HERNIA REPAIR N/A 09/16/2021   Procedure: OPEN INCISIONAL HERNIA REPAIR WITH MESH;  Surgeon: [name], MD;  Location: [facility];  Service: General;  Laterality: N/A;   JOINT REPLACEMENT     RT TOTAL HIP / RT [place]    [place] STUDY  06/21/2018   Procedure: [place] IMPEDANCE STUDY;  Surgeon: Lavena Bullion, DO;  [facility];  Service: Gastroenterology;;   POLYPECTOMY  06/10/2020   Procedure: POLYPECTOMY;  Surgeon: Lavena Bullion, DO;  [facility];  Service: Gastroenterology;;   SKIN CANCER EXCISION  2016   RT SIDE OF NOSE   SUBMUCOSAL TATTOO INJECTION  06/10/2020   Procedure: SUBMUCOSAL TATTOO INJECTION;  Surgeon: Lavena Bullion, DO;  [facility];  Service: Gastroenterology;;   [place]  2010   RIGHT   TOTAL HIP ARTHROPLASTY Left 05/09/2015   Procedure: LEFT TOTAL HIP ARTHROPLASTY ANTERIOR APPROACH;  Surgeon: Gaynelle Arabian, MD;  [facility];  Service: Orthopedics;  Laterality: Left;   TOTAL KNEE ARTHROPLASTY  2001   TUMOR REMOVAL  2012   ABDOMINAL - NON CANCEROUS   Past Surgical History:  Procedure Laterality Date   2 HOUR [place] STUDY N/A 06/21/2018   Procedure: 24 HOUR PH STUDY;  Surgeon: Lavena Bullion, DO;  [facility];  Service: Gastroenterology;  Laterality: N/A;  with impedance   ABDOMINAL HYSTERECTOMY  2010   ANAL RECTAL MANOMETRY N/A 09/19/2019   Procedure: ANO RECTAL MANOMETRY;  Surgeon: Mauri Pole, MD;  [facility];  Service:  Endoscopy;  Laterality: N/A;   [place]   BIOPSY  06/10/2020   Procedure: BIOPSY;  Surgeon: Lavena Bullion, DO;  [facility];  Service: Gastroenterology;;  EGD and COLON   BREAST SURGERY     CATARACT EXTRACTION Bilateral 2011   CHOLECYSTECTOMY     COLON RESECTION N/A 06/12/2020   Procedure: LAPAROSCOPIC COLON RESECTION;  Surgeon: [name], MD;  [facility];  Service: General;  Laterality: N/A;   COLONOSCOPY WITH PROPOFOL N/A 06/10/2020   Procedure: COLONOSCOPY WITH PROPOFOL;  Surgeon: Lavena Bullion, DO;  [facility];  Service: Gastroenterology;  Laterality: N/A;   ESOPHAGEAL MANOMETRY N/A 06/21/2018   Procedure: ESOPHAGEAL MANOMETRY (EM);  Surgeon: Bryan Lemma,  [name], DO;  [facility];  Service: Gastroenterology;  Laterality: N/A;   ESOPHAGOGASTRODUODENOSCOPY (EGD) WITH PROPOFOL N/A 06/10/2020   Procedure: ESOPHAGOGASTRODUODENOSCOPY (EGD) WITH PROPOFOL;  Surgeon: Lavena Bullion, DO;  [facility];  Service: Gastroenterology;  Laterality: N/A;   GALLBLADDER SURGERY  2015   INCISIONAL HERNIA REPAIR N/A 09/16/2021   Procedure: OPEN INCISIONAL HERNIA REPAIR WITH MESH;  Surgeon: [name], MD;  Location: [facility];  Service: General;  Laterality: N/A;   JOINT REPLACEMENT     RT TOTAL HIP / RT [place]    [place] STUDY  06/21/2018   Procedure: [place] IMPEDANCE STUDY;  Surgeon: Lavena Bullion, DO;  [facility];  Service: Gastroenterology;;   POLYPECTOMY  06/10/2020   Procedure: POLYPECTOMY;  Surgeon: Lavena Bullion, DO;  [facility];  Service: Gastroenterology;;   SKIN CANCER EXCISION  2016   RT SIDE OF NOSE   SUBMUCOSAL TATTOO INJECTION  06/10/2020   Procedure: SUBMUCOSAL TATTOO INJECTION;  Surgeon: Lavena Bullion, DO;  [facility];  Service: Gastroenterology;;   [place]  2010    RIGHT   TOTAL HIP ARTHROPLASTY Left 05/09/2015   Procedure: LEFT TOTAL HIP ARTHROPLASTY ANTERIOR APPROACH;  Surgeon: Gaynelle Arabian, MD;  [facility];  Service: Orthopedics;  Laterality: Left;   TOTAL KNEE ARTHROPLASTY  2001   TUMOR REMOVAL  2012   ABDOMINAL - NON CANCEROUS   Past Medical History:  Diagnosis Date   Arthritis    Cancer ([place])    HX BREAST CANCER/ SKIN CANCER   Colon cancer (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   PONV (postoperative nausea and vomiting)    PT STATES MORPHINE CAUSED N/V   Rosacea    Sleep apnea    Stage 1 breast cancer, ER+, right ([facility]) 07/04/2017   BP 138/78   Pulse 68   Ht '5\' 6"'$  (1.676 m)   Wt 204 lb 9.6 oz (92.8 kg)   SpO2 95%   BMI 33.02 kg/m   Opioid Risk Score:   Fall Risk Score:  `1  Depression screen PHQ 2/9     10/30/2021    1:08 PM 09/29/2021   12:54 PM 09/01/2021    1:29 PM 08/07/2021    3:30 PM 07/30/2021    1:01 PM 06/02/2021    1:20 PM 05/14/2021    1:29 PM  Depression screen PHQ 2/9  Decreased Interest 0 0 0 0 0 0 0  Down, Depressed, Hopeless 0 0 0 0 0 0 0  PHQ - 2 Score 0 0 0 0 0 0 0     Review of Systems  Constitutional: Negative.   HENT: Negative.    Eyes: Negative.   Respiratory: Negative.    Cardiovascular: Negative.   Gastrointestinal: Negative.   Endocrine: Negative.   Genitourinary: Negative.   Musculoskeletal:  Positive for back pain and gait problem.  Skin: Negative.   Allergic/Immunologic: Negative.   Hematological: Negative.   Psychiatric/Behavioral: Negative.        Objective:   Physical  Exam Vitals and nursing note reviewed.  Constitutional:      Appearance: Normal appearance.  Cardiovascular:     Rate and Rhythm: Normal  rate and regular rhythm.     Pulses: Normal pulses.     Heart sounds: Normal heart sounds.  Pulmonary:     Effort: Pulmonary effort is normal.     Breath sounds: Normal breath sounds.  Musculoskeletal:     Cervical back: Normal range of motion and neck supple.     Comments: Normal Muscle Bulk and Muscle Testing Reveals: Upper Extremities: Full ROM and Muscle Strength 5/5 Lumbar Paraspinal Tenderness: L-3-L-5 Lower Extremities: Full ROM and Muscle Strength 5/5 Bilateral Lower Extremities Flexion Produces Pain into her Bilateral Lower Extremities Arises from Chair Slowly using walker for support Narrow Based   Gait     Skin:    General: Skin is warm and dry.  Neurological:     Mental Status: She is alert and oriented to person, place, and time.  Psychiatric:        Mood and Affect: Mood normal.        Behavior: Behavior normal.         Assessment & Plan:  1. Chronic Bilateral Leg pain: Paresthesia: Continue to Monitor. 10/30/2021 2. Paresthesia Doreene Burke Radiculitis: Ms. Riehle has weaned herself off the Lyrica due to daytime drowsiness. We will continue to monitor. 10/30/2021 3. Pain of Left Wrist/ : No complaints today. S/P Carpal Tunnel Release on 06/03/2017 by Dr. Ellene Route. Dr. Ellene Route Following. 10/30/2021. 4. Fracture of superior pubic ramus.  Dr. Wynelle Link Following.  Continue to monitor. 10/30/2021. 5. Bilateral Knee OA: Continue Voltaren Gel.  Continue to monitor. Orthopedist following. 10/30/2021 6. Polyarthralgia: Continue to alternate with heat and ice therapy. Continue current medication regime. Continue to monitor. 10/30/2021. 7. Chronic Pain Syndrome: Refilled::Hydrocodone 10/325 mg one tablet every 6 hours as needed for moderate pain #120. Second prescription e-scribed for the following month.10/30/2021. 8. Lumbar Compression Fracture L2 and L3:  Ms. Wentworth refused physical therapy. Continue with rest/ heat therapy. Continue to Monitor 10/30/2021. 9. Muscle Spasm: Continue   Flexeril 5 mg at HS. Continue to monitor. 10/30/2021. 10. Right Shoulder Tendonitis: No complaints today. Continue HEP as Tolerated. Alternate Ice and Heat Therapy. 10/30/2021  11. Greater Trochanter Bursitis: No Complaints today. Continue to Monitor. Continue HEP as Tolerated. 10/30/2021   F/U in 1 Month

## 2021-11-03 ENCOUNTER — Encounter: Payer: Self-pay | Admitting: Obstetrics and Gynecology

## 2021-11-03 ENCOUNTER — Ambulatory Visit: Payer: Medicare Other | Admitting: Neurology

## 2021-11-03 ENCOUNTER — Other Ambulatory Visit: Payer: Self-pay

## 2021-11-03 ENCOUNTER — Ambulatory Visit (INDEPENDENT_AMBULATORY_CARE_PROVIDER_SITE_OTHER): Payer: Medicare Other | Admitting: Obstetrics and Gynecology

## 2021-11-03 VITALS — BP 145/80 | HR 77

## 2021-11-03 DIAGNOSIS — N390 Urinary tract infection, site not specified: Secondary | ICD-10-CM | POA: Diagnosis not present

## 2021-11-03 DIAGNOSIS — R052 Subacute cough: Secondary | ICD-10-CM

## 2021-11-03 DIAGNOSIS — R351 Nocturia: Secondary | ICD-10-CM | POA: Diagnosis not present

## 2021-11-03 DIAGNOSIS — E559 Vitamin D deficiency, unspecified: Secondary | ICD-10-CM

## 2021-11-03 MED ORDER — VITAMIN D3 1.25 MG (50000 UT) PO CAPS: ORAL_CAPSULE | ORAL | 3 refills | Status: DC

## 2021-11-03 MED ORDER — METHENAMINE HIPPURATE 1 G PO TABS: 1.0000 g | ORAL_TABLET | Freq: Two times a day (BID) | ORAL | 11 refills | Status: DC

## 2021-11-03 MED ORDER — MIRABEGRON ER 25 MG PO TB24: 25.0000 mg | ORAL_TABLET | Freq: Every day | ORAL | 5 refills | Status: DC

## 2021-11-03 NOTE — Progress Notes (Signed)
[facility] Return Visit  SUBJECTIVE  History of Present Illness: Crystal Lee is a 88 y.o. female seen in follow-up for recurrent UTI. She has been on once daily nitrofurantoin prophylaxis. She has had a great 6 months while on the prophylactic antibitotics because she has not had any symptoms of a UTI.   Has been using coconut oil on the vulva and has helped with her irritation.   Recently, she has been waking up at least 3 times a night to urinate. She does not drink any caffeine in the afternoon and stops drinking around 6-7pm (goes to bed at 10). Reports she does not have sleep apnea (although listed in her medical history).   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   Past Surgical History: She  has a past surgical history that includes Breast surgery; Cholecystectomy; Total hip arthroplasty (2010); Mastectomy (1998); Joint replacement; Total knee arthroplasty (2001); Back surgery (1973); Skin cancer excision (2016); Appendectomy (1949); Tumor removal (2012); Total hip arthroplasty (Left, 05/09/2015); Abdominal hysterectomy (2010); Gallbladder surgery (2015); Tonsillectomy (1953); Cataract extraction (Bilateral, 2011); Esophageal manometry (N/A, 06/21/2018); 24 hour ph study (N/A, 06/21/2018); PH impedance study (06/21/2018); Anal Rectal manometry (N/A, 09/19/2019); Colonoscopy with propofol (N/A, 06/10/2020); Esophagogastroduodenoscopy (egd) with propofol (N/A, 06/10/2020); biopsy (06/10/2020); Submucosal tattoo injection (06/10/2020); polypectomy (06/10/2020); Colon  resection (N/A, 06/12/2020); and Incisional hernia repair (N/A, 09/16/2021).   Medications: She has a current medication list which includes the following prescription(s): methenamine, mirabegron er, aspirin ec, atorvastatin, benzonatate, vitamin d3, cyclobenzaprine, estradiol, cheratussin ac, hydrocodone-acetaminophen, losartan, multivitamin adults, nitrofurantoin (macrocrystal-monohydrate), phenazopyridine, and vitamin b-12.   Allergies: Patient is allergic to darvon [propoxyphene], aricept [donepezil], adhesive [tape], and morphine and related.   Social History: Patient  reports that she quit smoking about 45 years ago. Her smoking use included cigarettes. She has a 10.00 pack-year smoking history. She has never used smokeless tobacco. She reports that she does not drink alcohol and does not use drugs.      OBJECTIVE     Physical Exam: Vitals:   11/03/21 1257  BP: (!) 145/80  Pulse: 77   Gen: No apparent distress, A&O x 3.  Detailed Urogynecologic Evaluation:  Deferred. Prior exam showed:      View : No data to display.             ASSESSMENT AND PLAN    Crystal Lee is a 88 y.o. with:  1. Recurrent UTI   2. Nocturia    rUTI - Stop antibiotic prophylaxis - Start methenamine 1g BID- sent to pharmacy.  - Discussed benefits of vaginal estrogen in prevention of UTI and she declines due to history of breast cancer - Also recommended taking D-mannose over the counter for prevention.   2. Nocturia - start Myrbetriq '25mg'$  daily, samples provided.  - not a candidate for anticholinergics due to dementia  Follow up 6 weeks  Jaquita Folds, MD

## 2021-11-03 NOTE — Patient Instructions (Signed)
Stop taking antibiotic.   Start methenamine twice a day and D-mannose daily.   Start Myrbetriq '25mg'$  daily.

## 2021-11-30 ENCOUNTER — Other Ambulatory Visit (HOSPITAL_COMMUNITY): Payer: Self-pay

## 2021-11-30 ENCOUNTER — Telehealth: Payer: Self-pay

## 2021-11-30 MED ORDER — HYDROCODONE-ACETAMINOPHEN 10-325 MG PO TABS
1.0000 | ORAL_TABLET | Freq: Four times a day (QID) | ORAL | 0 refills | Status: DC | PRN
  Filled 2021-11-30: qty 120, 30d supply, fill #0

## 2021-11-30 NOTE — Telephone Encounter (Signed)
PMP was Reviewed.  Hydrocodone e-scribed to [facility], Ms. Conry is aware of the above.

## 2021-11-30 NOTE — Telephone Encounter (Signed)
Patient called stating she was suppose to pick up Hydrocodone prescription at [facility] but they didn't have the prescription. I called the pharmacy and they stated that they have a prescription on file but they do not have any in stock and not sure when they will get any in. I told them to cancel prescription. [facility] does have the medication in stock. Please send to [facility]. Patient is aware.

## 2021-11-30 NOTE — Telephone Encounter (Signed)
[facility] called and stated they do not take the patient's insurance [employer]. She stated they are not contracted with them and the Hydrocodone would be $135. She stated [facility] is contracted with [employer] because it is a Psychologist, prison and probation services. Prescription cancelled at [facility]. Please send to [facility].

## 2021-11-30 NOTE — Telephone Encounter (Signed)
PMP was Reviewed.  Hydrocodone e-scribed to [facility].  Crystal Lee is aware of the above.

## 2021-12-03 ENCOUNTER — Telehealth: Payer: Self-pay

## 2021-12-03 DIAGNOSIS — R351 Nocturia: Secondary | ICD-10-CM

## 2021-12-03 MED ORDER — MIRABEGRON ER 25 MG PO TB24: 25.0000 mg | ORAL_TABLET | Freq: Every day | ORAL | 2 refills | Status: DC

## 2021-12-03 NOTE — Telephone Encounter (Signed)
Pharmacy called for a refill on Myrbetriq '25mg'$ .  Pt requesting a 90 day supply. Medication sent to the pharmacy

## 2021-12-08 ENCOUNTER — Encounter: Payer: Medicare Other | Attending: Physical Medicine & Rehabilitation | Admitting: Registered Nurse

## 2021-12-08 ENCOUNTER — Other Ambulatory Visit (HOSPITAL_COMMUNITY): Payer: Self-pay

## 2021-12-08 ENCOUNTER — Encounter: Payer: Self-pay | Admitting: Registered Nurse

## 2021-12-08 VITALS — BP 133/79 | HR 77 | Ht 66.0 in | Wt 204.8 lb

## 2021-12-08 DIAGNOSIS — R2990 Unspecified symptoms and signs involving the nervous system: Secondary | ICD-10-CM | POA: Diagnosis not present

## 2021-12-08 DIAGNOSIS — M170 Bilateral primary osteoarthritis of knee: Secondary | ICD-10-CM

## 2021-12-08 DIAGNOSIS — M62838 Other muscle spasm: Secondary | ICD-10-CM

## 2021-12-08 DIAGNOSIS — M7061 Trochanteric bursitis, right hip: Secondary | ICD-10-CM

## 2021-12-08 DIAGNOSIS — R202 Paresthesia of skin: Secondary | ICD-10-CM

## 2021-12-08 DIAGNOSIS — M961 Postlaminectomy syndrome, not elsewhere classified: Secondary | ICD-10-CM | POA: Diagnosis not present

## 2021-12-08 DIAGNOSIS — Z79891 Long term (current) use of opiate analgesic: Secondary | ICD-10-CM

## 2021-12-08 DIAGNOSIS — G894 Chronic pain syndrome: Secondary | ICD-10-CM

## 2021-12-08 DIAGNOSIS — M2550 Pain in unspecified joint: Secondary | ICD-10-CM | POA: Diagnosis not present

## 2021-12-08 DIAGNOSIS — M7062 Trochanteric bursitis, left hip: Secondary | ICD-10-CM | POA: Diagnosis not present

## 2021-12-08 DIAGNOSIS — Z5181 Encounter for therapeutic drug level monitoring: Secondary | ICD-10-CM | POA: Diagnosis not present

## 2021-12-08 DIAGNOSIS — S32000S Wedge compression fracture of unspecified lumbar vertebra, sequela: Secondary | ICD-10-CM

## 2021-12-08 MED ORDER — HYDROCODONE-ACETAMINOPHEN 10-325 MG PO TABS
1.0000 | ORAL_TABLET | Freq: Four times a day (QID) | ORAL | 0 refills | Status: DC | PRN
  Filled 2021-12-08 – 2021-12-29 (×2): qty 120, 30d supply, fill #0

## 2021-12-08 NOTE — Patient Instructions (Signed)
Call Crystal Lee on 07/28th or 12/28/2021  Regarding your medication.   Let me know if you have any problems.  [phone]

## 2021-12-08 NOTE — Progress Notes (Signed)
Subjective:    Patient ID: Crystal Lee, female    DOB: June 19, 1933, 88 y.o.   MRN: [id number]  HPI: Crystal Lee is a 88 y.o. female who returns for follow up appointment for chronic pain and medication refill. She states her pain is located in her  mid- lower back pain radiating into her bilateral lower extremities and generalized joint pain. She rates her pain 5. Her current exercise regime is walking and performing stretching exercises.  Crystal Lee equivalent is 40.00 MME.       Pain Inventory Average Pain 5 Pain Right Now 5 My pain is burning, stabbing, tingling, and aching  In the last 24 hours, has pain interfered with the following? General activity 7 Relation with others 3 Enjoyment of life 5 What TIME of day is your pain at its worst? evening Sleep (in general) Fair  Pain is worse with: walking, bending, standing, and some activites Pain improves with: rest, heat/ice, and medication Relief from Meds: 5  Family History  Problem Relation Age of Onset   Other Mother        complications from flu   Other Father        unsure of cause   Colon cancer Neg Hx    Social History   Socioeconomic History   Marital status: Widowed    Spouse name: Not on file   Number of children: 3   Years of education: 16 years   Highest education level: Not on file  Occupational History   Occupation: Retired  Tobacco Use   Smoking status: Former    Packs/day: 0.50    Years: 20.00    Total pack years: 10.00    Types: Cigarettes    Quit date: 02/03/1976    Years since quitting: 45.8   Smokeless tobacco: Never  Vaping Use   Vaping Use: Never used  Substance and Sexual Activity   Alcohol use: No   Drug use: No   Sexual activity: Not Currently    Birth control/protection: Post-menopausal  Other Topics Concern   Not on file  Social History Narrative   Lives at home with husband.   Right-handed.   No caffeine use.   Social Determinants of Health   Financial  Resource Strain: Low Risk  (08/07/2021)   Overall Financial Resource Strain (CARDIA)    Difficulty of Paying Living Expenses: Not hard at all  Food Insecurity: No Food Insecurity (08/07/2021)   Hunger Vital Sign    Worried About Running Out of Food in the Last Year: Never true    Ran Out of Food in the Last Year: Never true  Transportation Needs: No Transportation Needs (08/07/2021)   PRAPARE - Hydrologist (Medical): No    Lack of Transportation (Non-Medical): No  Physical Activity: Not on file  Stress: Not on file  Social Connections: Moderately Isolated (08/07/2021)   Social Connection and Isolation Panel [NHANES]    Frequency of Communication with Friends and Family: More than three times a week    Frequency of Social Gatherings with Friends and Family: More than three times a week    Attends Religious Services: More than 4 times per year    Active Member of [employer] or Organizations: No    Attends Archivist Meetings: Never    Marital Status: Widowed   Past Surgical History:  Procedure Laterality Date   50 HOUR [place] STUDY N/A 06/21/2018   Procedure: [place] STUDY;  Surgeon:  Cirigliano, [name], DO;  Location: [facility];  Service: Gastroenterology;  Laterality: N/A;  with impedance   ABDOMINAL HYSTERECTOMY  2010   ANAL RECTAL MANOMETRY N/A 09/19/2019   Procedure: ANO RECTAL MANOMETRY;  Surgeon: Mauri Pole, MD;  [facility]: [facility];  Service: Endoscopy;  Laterality: N/A;   [place]   BIOPSY  06/10/2020   Procedure: BIOPSY;  Surgeon: Lavena Bullion, DO;  Location: [facility];  Service: Gastroenterology;;  EGD and COLON   BREAST SURGERY     CATARACT EXTRACTION Bilateral 2011   CHOLECYSTECTOMY     COLON RESECTION N/A 06/12/2020   Procedure: LAPAROSCOPIC COLON RESECTION;  Surgeon: [name], MD;  [facility]: [facility];  Service: General;  Laterality: N/A;   COLONOSCOPY WITH PROPOFOL N/A 06/10/2020    Procedure: COLONOSCOPY WITH PROPOFOL;  Surgeon: Lavena Bullion, DO;  [facility];  Service: Gastroenterology;  Laterality: N/A;   ESOPHAGEAL MANOMETRY N/A 06/21/2018   Procedure: ESOPHAGEAL MANOMETRY (EM);  Surgeon: Lavena Bullion, DO;  [facility];  Service: Gastroenterology;  Laterality: N/A;   ESOPHAGOGASTRODUODENOSCOPY (EGD) WITH PROPOFOL N/A 06/10/2020   Procedure: ESOPHAGOGASTRODUODENOSCOPY (EGD) WITH PROPOFOL;  Surgeon: Lavena Bullion, DO;  [facility];  Service: Gastroenterology;  Laterality: N/A;   GALLBLADDER SURGERY  2015   INCISIONAL HERNIA REPAIR N/A 09/16/2021   Procedure: OPEN INCISIONAL HERNIA REPAIR WITH MESH;  Surgeon: [name], MD;  Location: [place];  Service: General;  Laterality: N/A;   JOINT REPLACEMENT     RT TOTAL HIP / RT [place]    [place] STUDY  06/21/2018   Procedure: [place] IMPEDANCE STUDY;  Surgeon: Lavena Bullion, DO;  [facility];  Service: Gastroenterology;;   POLYPECTOMY  06/10/2020   Procedure: POLYPECTOMY;  Surgeon: Lavena Bullion, DO;  [facility];  Service: Gastroenterology;;   SKIN CANCER EXCISION  2016   RT SIDE OF NOSE   SUBMUCOSAL TATTOO INJECTION  06/10/2020   Procedure: SUBMUCOSAL TATTOO INJECTION;  Surgeon: Lavena Bullion, DO;  [facility];  Service: Gastroenterology;;   [place]  2010   RIGHT   TOTAL HIP ARTHROPLASTY Left 05/09/2015   Procedure: LEFT TOTAL HIP ARTHROPLASTY ANTERIOR APPROACH;  Surgeon: Gaynelle Arabian, MD;  [facility];  Service: Orthopedics;  Laterality: Left;   TOTAL KNEE ARTHROPLASTY  2001   TUMOR REMOVAL  2012   ABDOMINAL - NON CANCEROUS   Past Surgical History:  Procedure Laterality Date   18 HOUR [place] STUDY N/A 06/21/2018   Procedure: 24 HOUR [place] STUDY;  Surgeon: Lavena Bullion, DO;  [facility];  Service: Gastroenterology;  Laterality: N/A;   with impedance   ABDOMINAL HYSTERECTOMY  2010   ANAL RECTAL MANOMETRY N/A 09/19/2019   Procedure: ANO RECTAL MANOMETRY;  Surgeon: Mauri Pole, MD;  [facility];  Service: Endoscopy;  Laterality: N/A;   [place]   BIOPSY  06/10/2020   Procedure: BIOPSY;  Surgeon: Lavena Bullion, DO;  [facility];  Service: Gastroenterology;;  EGD and COLON   BREAST SURGERY     CATARACT EXTRACTION Bilateral 2011   CHOLECYSTECTOMY     COLON RESECTION N/A 06/12/2020   Procedure: LAPAROSCOPIC COLON RESECTION;  Surgeon: [name], MD;  [facility];  Service: General;  Laterality: N/A;   COLONOSCOPY WITH PROPOFOL  N/A 06/10/2020   Procedure: COLONOSCOPY WITH PROPOFOL;  Surgeon: Lavena Bullion, DO;  [facility];  Service: Gastroenterology;  Laterality: N/A;   ESOPHAGEAL MANOMETRY N/A 06/21/2018   Procedure: ESOPHAGEAL MANOMETRY (EM);  Surgeon: Lavena Bullion, DO;  [facility];  Service: Gastroenterology;  Laterality: N/A;   ESOPHAGOGASTRODUODENOSCOPY (EGD) WITH PROPOFOL N/A 06/10/2020   Procedure: ESOPHAGOGASTRODUODENOSCOPY (EGD) WITH PROPOFOL;  Surgeon: Lavena Bullion, DO;  [facility];  Service: Gastroenterology;  Laterality: N/A;   GALLBLADDER SURGERY  2015   INCISIONAL HERNIA REPAIR N/A 09/16/2021   Procedure: OPEN INCISIONAL HERNIA REPAIR WITH MESH;  Surgeon: [name], MD;  Location: [place];  Service: General;  Laterality: N/A;   JOINT REPLACEMENT     RT TOTAL HIP / RT [place]    [place] STUDY  06/21/2018   Procedure: [place] IMPEDANCE STUDY;  Surgeon: Lavena Bullion, DO;  [facility];  Service: Gastroenterology;;   POLYPECTOMY  06/10/2020   Procedure: POLYPECTOMY;  Surgeon: Lavena Bullion, DO;  [facility];  Service: Gastroenterology;;   SKIN CANCER EXCISION  2016   RT SIDE OF NOSE   SUBMUCOSAL TATTOO INJECTION  06/10/2020    Procedure: SUBMUCOSAL TATTOO INJECTION;  Surgeon: Lavena Bullion, DO;  [facility];  Service: Gastroenterology;;   [place]  2010   RIGHT   TOTAL HIP ARTHROPLASTY Left 05/09/2015   Procedure: LEFT TOTAL HIP ARTHROPLASTY ANTERIOR APPROACH;  Surgeon: Gaynelle Arabian, MD;  [facility];  Service: Orthopedics;  Laterality: Left;   TOTAL KNEE ARTHROPLASTY  2001   TUMOR REMOVAL  2012   ABDOMINAL - NON CANCEROUS   Past Medical History:  Diagnosis Date   Arthritis    Cancer ([place])    HX BREAST CANCER/ SKIN CANCER   Colon cancer ([place])    Complication of anesthesia    N/V WITH Lee   Difficulty sleeping    Fractured hip ([place])    LEFT - AUG 2016   GERD (gastroesophageal reflux disease)    Hyperlipidemia    Hypertension    Melanoma ([place])    Neuropathy    Nocturia    Osteopenia    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   PONV (postoperative nausea and vomiting)    PT STATES Lee CAUSED N/V   Rosacea    Sleep apnea    Stage 1 breast cancer, ER+, right (HCC) 07/04/2017   BP 133/79   Pulse 77   Ht '5\' 6"'$  (1.676 m)   Wt 204 lb 12.8 oz (92.9 kg)   SpO2 96%   BMI 33.06 kg/m   Opioid Risk Score:   Fall Risk Score:  `1  Depression screen PHQ 2/9     10/30/2021    1:08 PM 09/29/2021   12:54 PM 09/01/2021    1:29 PM 08/07/2021    3:30 PM 07/30/2021    1:01 PM 06/02/2021    1:20 PM 05/14/2021    1:29 PM  Depression screen PHQ 2/9  Decreased Interest 0 0 0 0 0 0 0  Down, Depressed, Hopeless 0 0 0 0 0 0 0  PHQ - 2 Score 0 0 0 0 0 0 0     Review of Systems  Musculoskeletal:  Positive for back pain.       Bilateral lower leg pain in the front and back Pain in legs in back  All other systems  reviewed and are negative.     Objective:   Physical Exam Vitals and nursing note reviewed.  Constitutional:      Appearance: Normal appearance.  Cardiovascular:     Rate and Rhythm: Normal rate and regular rhythm.      Pulses: Normal pulses.     Heart sounds: Normal heart sounds.  Pulmonary:     Effort: Pulmonary effort is normal.     Breath sounds: Normal breath sounds.  Musculoskeletal:     Cervical back: Normal range of motion and neck supple.     Comments: Normal Muscle Bulk and Muscle Testing Reveals:  Upper Extremities: Full ROM and Muscle Strength 5/5  Lumbar Hypersensitivity Lower Extremities: Full ROM and Muscle Strength 5/5 Bilateral Lower Extremity Flexion Produces Pain into his bilateral Patella Arises from Table Slowly using walker for support Narrow Based  Gait     Skin:    General: Skin is warm and dry.  Neurological:     Mental Status: She is alert and oriented to person, place, and time.  Psychiatric:        Mood and Affect: Mood normal.        Behavior: Behavior normal.         Assessment & Plan:  1. Chronic Bilateral Leg pain: Paresthesia: Continue to Monitor. 12/08/2021 2. Paresthesia Doreene Burke Radiculitis: Crystal Lee has weaned herself off the Lyrica due to daytime drowsiness. We will continue to monitor. 12/08/2021 3. Pain of Left Wrist/ : No complaints today. S/P Carpal Tunnel Release on 06/03/2017 by Dr. Ellene Route. Dr. Ellene Route Following. 12/08/2021. 4. Fracture of superior pubic ramus.  Dr. Wynelle Link Following.  Continue to monitor. 12/08/2021. 5. Bilateral Knee OA: Continue Voltaren Gel.  Continue to monitor. Orthopedist following. 12/08/2021 6. Polyarthralgia: Continue to alternate with heat and ice therapy. Continue current medication regime. Continue to monitor. 12/08/2021. 7. Chronic Pain Syndrome: Refilled::Hydrocodone 10/325 mg one tablet every 6 hours as needed for moderate pain #120. 12/08/2021 8. Lumbar Compression Fracture L2 and L3:  Crystal Lee refused physical therapy. Continue with rest/ heat therapy. Continue to Monitor 12/08/2021. 9. Muscle Spasm: Continue  Flexeril 5 mg at HS. Continue to monitor. 12/08/2021. 10. Right Shoulder Tendonitis: No complaints today.  Continue HEP as Tolerated. Alternate Ice and Heat Therapy. 12/08/2021  11. Greater Trochanter Bursitis: No Complaints today. Continue to Monitor. Continue HEP as Tolerated. 12/08/2021   F/U in 1 Month

## 2021-12-10 ENCOUNTER — Inpatient Hospital Stay: Payer: Medicare Other | Attending: Hematology & Oncology

## 2021-12-10 VITALS — BP 140/62 | HR 64 | Temp 97.8°F | Resp 18

## 2021-12-10 DIAGNOSIS — Z853 Personal history of malignant neoplasm of breast: Secondary | ICD-10-CM | POA: Insufficient documentation

## 2021-12-10 DIAGNOSIS — M8589 Other specified disorders of bone density and structure, multiple sites: Secondary | ICD-10-CM

## 2021-12-10 DIAGNOSIS — M810 Age-related osteoporosis without current pathological fracture: Secondary | ICD-10-CM | POA: Insufficient documentation

## 2021-12-10 DIAGNOSIS — M818 Other osteoporosis without current pathological fracture: Secondary | ICD-10-CM

## 2021-12-10 MED ORDER — ZOLEDRONIC ACID 4 MG/100ML IV SOLN
4.0000 mg | Freq: Once | INTRAVENOUS | Status: AC
  Administered 2021-12-10: 4 mg via INTRAVENOUS
  Filled 2021-12-10: qty 100

## 2021-12-10 MED ORDER — SODIUM CHLORIDE 0.9 % IV SOLN: Freq: Once | INTRAVENOUS | Status: AC

## 2021-12-10 NOTE — Patient Instructions (Signed)

## 2021-12-18 ENCOUNTER — Ambulatory Visit (INDEPENDENT_AMBULATORY_CARE_PROVIDER_SITE_OTHER): Payer: Medicare Other | Admitting: Obstetrics and Gynecology

## 2021-12-18 ENCOUNTER — Encounter: Payer: Self-pay | Admitting: Obstetrics and Gynecology

## 2021-12-18 VITALS — BP 136/76 | HR 79

## 2021-12-18 DIAGNOSIS — R351 Nocturia: Secondary | ICD-10-CM | POA: Diagnosis not present

## 2021-12-18 DIAGNOSIS — N952 Postmenopausal atrophic vaginitis: Secondary | ICD-10-CM

## 2021-12-18 MED ORDER — MIRABEGRON ER 50 MG PO TB24: 50.0000 mg | ORAL_TABLET | Freq: Every day | ORAL | 2 refills | Status: DC

## 2021-12-18 MED ORDER — ESTRADIOL 0.1 MG/GM VA CREA: 0.5000 g | TOPICAL_CREAM | VAGINAL | 11 refills | Status: DC

## 2021-12-18 NOTE — Progress Notes (Signed)
[facility] Return Visit  SUBJECTIVE  History of Present Illness: Crystal Lee is a 88 y.o. female seen in follow-up for recurrent UTI and overactive bladder  Was using coconut oil for vaginal moisture but this stopped working and she has been having vaginal burning. She had some old premarin and decided to use it for 3 days and this helped.   Leakage has been better with the myrbetriq.  Frequency has decreased during the day but still waking up 5 times per night.   Still taking the the methenamine twice a day and has not had any UTI symptoms.   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   Past Surgical History: She  has a past surgical history that includes Breast surgery; Cholecystectomy; Total hip arthroplasty (2010); Mastectomy (1998); Joint replacement; Total knee arthroplasty (2001); Back surgery (1973); Skin cancer excision (2016); Appendectomy (1949); Tumor removal (2012); Total hip arthroplasty (Left, 05/09/2015); Abdominal hysterectomy (2010); Gallbladder surgery (2015); Tonsillectomy (1953); Cataract extraction (Bilateral, 2011); Esophageal manometry (N/A, 06/21/2018); 24 hour ph study (N/A, 06/21/2018); PH impedance study (06/21/2018); Anal Rectal manometry (N/A, 09/19/2019); Colonoscopy with propofol (N/A, 06/10/2020); Esophagogastroduodenoscopy (egd) with propofol (N/A, 06/10/2020); biopsy (06/10/2020); Submucosal tattoo injection (06/10/2020); polypectomy (06/10/2020); Colon resection (N/A, 06/12/2020); and Incisional hernia repair (N/A, 09/16/2021).    Medications: She has a current medication list which includes the following prescription(s): [START ON 12/21/2021] estradiol, mirabegron er, aspirin ec, atorvastatin, benzonatate, vitamin d3, cyclobenzaprine, estradiol, cheratussin ac, hydrocodone-acetaminophen, losartan, methenamine, multivitamin adults, nitrofurantoin (macrocrystal-monohydrate), phenazopyridine, and vitamin b-12.   Allergies: Patient is allergic to darvon [propoxyphene], aricept [donepezil], adhesive [tape], and morphine and related.   Social History: Patient  reports that she quit smoking about 45 years ago. Her smoking use included cigarettes. She has a 10.00 pack-year smoking history. She has never used smokeless tobacco. She reports that she does not drink alcohol and does not use drugs.      OBJECTIVE     Physical Exam: Vitals:   12/18/21 1340  BP: 136/76  Pulse: 79   Gen: No apparent distress, A&O x 3.  Detailed Urogynecologic Evaluation:  Normal external genitalia. Speculum exam reveals atrophy, thin clear vaginal discharge present. Aptima swab obtained.    ASSESSMENT AND PLAN    Ms. Joost is a 88 y.o. with:  1. Vaginal atrophy   2. Nocturia     rUTI - Continue methenamine 1g BID  2. Nocturia - Increase dose of Myrbetriq to '50mg'$   3. Vaginal atrophy - aptima swab obtained to ensure no infection - Prescribed estrace 0.5g nightly for two weeks then twice a week after. We discussed that risks are very low with vaginal estrogen use due to the very low systemic absorption rate of ~ 0.01% with a twice-week regimen, and it has not been shown to increase breast cancer recurrence.   Follow up 4-6 weeks  Jaquita Folds, MD

## 2021-12-18 NOTE — Patient Instructions (Signed)
Start estrogen cream twice a week- you can place 0.5 g (pea sized amount) on your finger and into the vagina.   Start Myrbetriq '50mg'$  daily and stop the '25mg'$ .

## 2021-12-29 ENCOUNTER — Encounter: Payer: Self-pay | Admitting: Hematology & Oncology

## 2021-12-29 ENCOUNTER — Other Ambulatory Visit (HOSPITAL_COMMUNITY): Payer: Self-pay

## 2022-01-07 ENCOUNTER — Encounter: Payer: Self-pay | Admitting: Physician Assistant

## 2022-01-07 ENCOUNTER — Ambulatory Visit (INDEPENDENT_AMBULATORY_CARE_PROVIDER_SITE_OTHER): Payer: Medicare Other | Admitting: Physician Assistant

## 2022-01-07 VITALS — BP 158/75 | HR 95 | Resp 18 | Wt 209.0 lb

## 2022-01-07 DIAGNOSIS — R2990 Unspecified symptoms and signs involving the nervous system: Secondary | ICD-10-CM | POA: Diagnosis not present

## 2022-01-07 DIAGNOSIS — F0150 Vascular dementia without behavioral disturbance: Secondary | ICD-10-CM | POA: Diagnosis not present

## 2022-01-07 NOTE — Progress Notes (Signed)
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  She is seen today in follow up for memory loss.  Last MoCA was 18/30.  Of the brain was remarkable for either prior microhemorrhages from amyloid angiopathy due to possible Alzheimer's disease or prior microhemorrhages.  Initially, the patient had been prescribed donepezil, but she had significant diarrhea, and cramps with the medication.  She is not interested in starting any other dementia medication at this point unless her memory worsens.  No further TIA symptoms either.  Overall, she is stable from the cognitive and cardiovascular standpoint.   Recommendations:    Control cardiovascular risk factors and secondary stroke prevention.  Continue aspirin and Lipitor. Continue monitoring blood pressure not higher than 412 systolic Hold on antidementia medications as per patient request, unless her memory becomes worse at which point she agreed to get started.  Will entertain memantine. Follow up in  6 months.   Case discussed with Dr. Delice Lesch who agrees with the plan     Subjective:    This patient is accompanied in the office by  who supplements the history.  Previous records as well as any outside records available were reviewed prior to todays visit.  Last visit was on 07/10/2021.  She is no longer on donepezil as she has significant GI distress   Any changes in memory since last visit?  Denies.  The patient is doing well, she enjoys doing crossword puzzles, does capitals of the world, etc.  She has always issues with short-term memory, but has not been any different from her prior visit.   Long-term memory is good. Patient lives with: Alone repeats oneself?  Endorsed Disoriented when walking into a room?  Patient denies   Leaving objects in unusual places?  Patient denies   Ambulates  with difficulty?  She uses a right cane, and a rollator to ambulate for stability.  She has significant arthritis, which interferes with her ambulation. Recent falls?  Patient denies   Any head injuries?  Patient denies   History of seizures?   Patient denies   Wandering behavior?  Patient denies   Patient drives?   Patient no longer drives  Any mood changes since last visit?  Patient denies   Any worsening depression?:  Patient denies   Hallucinations?  Patient denies   Paranoia?  Patient denies   Patient reports that she sleeps well without vivid dreams, REM behavior or sleepwalking   History of sleep apnea?  Patient denies   Any hygiene concerns?  Patient denies   Independent of bathing and dressing?  Endorsed  Does the patient needs help with medications?  Denies Who is in charge of the finances?   is in charge    Any changes in appetite?  Patient denies   Patient have trouble swallowing? Patient denies   Does the patient cook?  Patient denies   Any kitchen accidents such as leaving the stove on? Patient denies   Any headaches?  Patient denies   Double vision? Patient denies   Any focal numbness or tingling?  Patient denies   Chronic back pain Patient denies   Unilateral weakness?  Patient denies  Any tremors?  Patient denies   Any history of anosmia?  Patient denies   Any incontinence of urine?  Patient denies   Any bowel dysfunction?  As mentioned above, the patient had significant diarrhea after starting donepezil.  "I am well now "   Initial visit 05/05/2021 the patient is seen in neurologic consultation at the request of Copland, Gay Filler, MD for the evaluation of memory.  The patient is here alone.  This is a 88 y.o. year old RH  female who presents today after  hospitalization for strokelike symptoms.  It was also noted that she may have had cognitive issues during the presentation, at which time a MoCA was performed, yielding a result of 18/30.  Of note, the patient denies having any cognitive issues, treated in some of short-term memory difficulties to "old age ".  During the hospitalization, she was diagnosed with small vessel disease, evidence of amyloid angiopathy per MRI, central nervous system degenerative disorder, and mild to moderate Alzheimer's disease per review of the notes.  She was to follow-up at [facility], but she wanted to seek additional neurologic opinion with [facility] neurology instead. For her memory, she denies repeating herself, or being disoriented when walking into the room.  She denies leaving objects in unusual places.  She ambulates with her walker for safety.  She had prior falls, fracturing her pelvis, and "living with pain over the last 88 years old older ".  She only had 1 minor head injury about 17 years ago while cleaning a window, and hitting her head while she was moving forward.  She did not lose any consciousness at that time.  She denies being disoriented when walking, or wandering behavior.  She drives any distances, "without GPS I do not need it ".  She stopped driving long distances however when she came from [place] in 2018 to retire, and her vision was not as good.  Since her husband's death, she lives alone, and has home health visiting her.  Her mood is good, she denies depression or irritability.  She enjoys painting, and coloring.  She is to be able to do crossword puzzles but she stopped due to "eye problems ".  She sleeps well, denies vivid dreams or sleepwalking.  She denies hallucinations or paranoia.  There are no hygiene concerns.  She is independent of bathing and dressing.  Her medications are in a pillbox.  Her finances are done by her, only taxes are done by one of her children.  Her appetite is good, denies any trouble  swallowing.  She cooks without forgetting any common recipes, or burning the stove.     As for her strokelike symptoms, the patient reports that she had similar episodes in the year 2000, when she noticed mild facial drooping and numbness, which quickly resolved, without recurrence.  On the day of evaluation in October 2022, she was at her doctor's office, when she had 20 second  episode (she denies this was 20-minute episodes as in chart).  Her PCP told her to go "downstairs today urgent care, to check that out ".  From the urgent care, the transported her to the ER, for further evaluation, suspecting that the patient was having a TIA.  Per chart notes, she may have had possible right facial droop and right-sided weakness, although she states that for the last 20 years, she has slightly decreased strength on the right side of her body.  The ER neurologist saw her, and work-up was initiated.  MRI of the brain did not show acute CVA, but did show amyloid angiopathy.  EEG was without seizures.  2D echo was unremarkable.  Neurology recommended aspirin 81 mg on discharge, and no DAPT was going to be given, because of concern of amyloid angiopathy.  Lipitor 20 was added on discharge.  No further episodes of strokelike symptoms since then.  Of note, during that hospitalization, she also was found to have UTI "I know I had it, I have regular UTIs".  EKG shows sinus tachycardia.  COVID was negative.  Neurology recommended that she had a better control of her blood pressure to lays down on 329 systolic.  No stroke was diagnosed.  She denies today any headaches, double vision, dizziness, focal numbness or tingling, unilateral weakness other than the chronic mild right upper extremity as mentioned above, tremors or anosmia.  No history of seizures.  She denies urine incontinence, but occasionally wears a pad when going out.  She denies any retention.  She has a long history of constipation.  She denies any diarrhea.  She  denies sleep apnea, although in chart it shows in certain areas that she may have it.  She denies any alcohol or tobacco.  Family history negative for dementia.  The patient is originally from [place].      UA and urine culture showed E. coli, resistant to bacterium, she was treated with Keflex 500 twice a day, was seen by urogynecologist Dr. Wannetta Sender, was put on Bactrim prophylactic treatment for 6 months, she had a history of frequent UTI  Laboratory evaluation showed LDL 91, A1c 5.0, normal CBC, hemoglobin of 13.4, CMP, creatinine of 0.6, she was discharged with aspirin 81 mg daily, and Lipitor 20 mg daily       MRI brain  1. No evidence of acute intracranial abnormality, including infarct. 2. Numerous tiny scattered foci of susceptibility artifact throughout the cortex bilaterally, greatest in the parieto-occipital lobes. While nonspecific, findings could relate to prior microhemorrhages from amyloid angiopathy or prior microemboli. The distribution is atypical for hypertensive hemorrhages.      EMG/NCS 2019 bilateral lower extremity paresthesia, bilateral leg pain, EMG nerve conduction study then showed no large fiber peripheral neuropathy  PREVIOUS MEDICATIONS:   CURRENT MEDICATIONS:  Outpatient Encounter Medications as of 01/07/2022  Medication Sig   aspirin EC 81 MG tablet Take 81 mg by mouth daily. Swallow whole.   atorvastatin (LIPITOR) 20 MG tablet Take 1 tablet (20 mg total) by mouth daily.   benzonatate (TESSALON) 100 MG capsule Take 1 capsule (100 mg total) by mouth 3 (three) times daily.   Cholecalciferol (VITAMIN D3) 1.25 MG (50000 UT) CAPS Take 1 weekly   cyclobenzaprine (FLEXERIL) 5 MG tablet Take 1 tablet (5 mg total) by mouth at bedtime as needed for muscle spasms.   estradiol (ESTRACE VAGINAL) 0.1 MG/GM vaginal cream Apply 1gm vaginally 1-3x a week as needed for comfort (Patient taking differently: Place 1 g vaginally See admin instructions. 1g vaginally once to three  times a week as needed for comfort with UTI)   estradiol (ESTRACE) 0.1 MG/GM vaginal cream Place 0.5 g vaginally 2 (two) times a week. Place 0.5g nightly twice a week after   guaiFENesin-codeine (CHERATUSSIN AC) 100-10 MG/5ML syrup Take 5 mLs by mouth 3 (three) times daily as needed for cough.   HYDROcodone-acetaminophen (NORCO) 10-325 MG tablet Take 1 tablet by mouth every 6 (six) hours as needed. Do Not Fill Before 12/28/2021   losartan (COZAAR) 25 MG tablet Take  1 tablet (25 mg total) by mouth daily.   methenamine (HIPREX) 1 g tablet Take 1 tablet (1 g total) by mouth 2 (two) times daily with a meal.   mirabegron ER (MYRBETRIQ) 50 MG TB24 tablet Take 1 tablet (50 mg total) by mouth daily.   Multiple Vitamins-Minerals (MULTIVITAMIN ADULTS) TABS Take 1 tablet by mouth daily.   nitrofurantoin, macrocrystal-monohydrate, (MACROBID) 100 MG capsule Take 1 capsule (100 mg total) by mouth daily.   phenazopyridine (PYRIDIUM) 200 MG tablet Take 1 tablet (200 mg total) by mouth 3 (three) times daily as needed for pain.   vitamin B-12 (CYANOCOBALAMIN) 1000 MCG tablet Take 1,000 mcg by mouth daily.   No facility-administered encounter medications on file as of 01/07/2022.        No data to display            05/05/2021   12:00 PM  Montreal Cognitive Assessment   Visuospatial/ Executive (0/5) 2  Naming (0/3) 3  Attention: Read list of digits (0/2) 2  Attention: Read list of letters (0/1) 1  Attention: Serial 7 subtraction starting at 100 (0/3) 1  Language: Repeat phrase (0/2) 1  Language : Fluency (0/1) 0  Abstraction (0/2) 2  Delayed Recall (0/5) 0  Orientation (0/6) 6  Total 18    Objective:     PHYSICAL EXAMINATION:    VITALS:   Vitals:   01/07/22 1123  BP: (!) 158/75  Pulse: 95  Resp: 18  SpO2: 100%  Weight: 209 lb (94.8 kg)    GEN:  The patient appears stated age and is in NAD. HEENT:  Normocephalic, atraumatic.   Neurological examination:  General: NAD, well-groomed,  appears stated age. Orientation: The patient is alert. Oriented to person, place and date Cranial nerves: There is good facial symmetry.The speech is fluent and clear. No aphasia or dysarthria. Fund of knowledge is appropriate. Recent memory impaired, remote memory intact.  Attention and concentration are normal.  Able to name objects and repeat phrases.  Hearing is intact to conversational tone.    Sensation: Sensation is intact to light touch throughout Motor: Strength is at least antigravity x4. Tremors: none  DTR's 2/4 in UE/LE     Movement examination: Tone: There is normal tone in the UE/LE Abnormal movements:  no tremor.  No myoclonus.  No asterixis.   Coordination:  There is no decremation with RAM's. Normal finger to nose  Gait and Station: The patient has some difficulty arising out of a deep-seated chair without the use of the hands, needs her walker. The patient's stride length is good.  Gait is cautious and narrow.    Thank you for allowing [place] the opportunity to participate in the care of this nice patient. Please do not hesitate to contact us for any questions or concerns.   Total time spent on today's visit was 32 minutes dedicated to this patient today, preparing to see patient, examining the patient, ordering tests and/or medications and counseling the patient, documenting clinical information in the EHR or other health record, independently interpreting results and communicating results to the patient/family, discussing treatment and goals, answering patient's questions and coordinating care.  Cc:  Copland, Gay Filler, MD  Sharene Butters 01/07/2022 11:33 AM

## 2022-01-07 NOTE — Patient Instructions (Addendum)
It was a pleasure to see you today at our office.   Recommendations:  Follow up Feb 12 at 11:30  Strong control of cardiovascular risk factors Continue baby aspirin and lipitor, follow up with primary doctor      RECOMMENDATIONS FOR ALL PATIENTS WITH MEMORY PROBLEMS: 1. Continue to exercise (Recommend 30 minutes of walking everyday, or 3 hours every week) 2. Increase social interactions - continue going to [place] and enjoy social gatherings with friends and family 3. Eat healthy, avoid fried foods and eat more fruits and vegetables 4. Maintain adequate blood pressure, blood sugar, and blood cholesterol level. Reducing the risk of stroke and cardiovascular disease also helps promoting better memory. 5. Avoid stressful situations. Live a simple life and avoid aggravations. Organize your time and prepare for the next day in anticipation. 6. Sleep well, avoid any interruptions of sleep and avoid any distractions in the bedroom that may interfere with adequate sleep quality 7. Avoid sugar, avoid sweets as there is a strong link between excessive sugar intake, diabetes, and cognitive impairment We discussed the Mediterranean diet, which has been shown to help patients reduce the risk of progressive memory disorders and reduces cardiovascular risk. This includes eating fish, eat fruits and green leafy vegetables, nuts like almonds and hazelnuts, walnuts, and also use olive oil. Avoid fast foods and fried foods as much as possible. Avoid sweets and sugar as sugar use has been linked to worsening of memory function.  There is always a concern of gradual progression of memory problems. If this is the case, then we may need to adjust level of care according to patient needs. Support, both to the patient and caregiver, should then be put into place.    The Alzheimer's Association is here all day, every day for people facing Alzheimer's disease through our free 24/7 Helpline: [phone])307-8391. The Helpline  provides reliable information and support to all those who need assistance, such as individuals living with memory loss, Alzheimer's or other dementia, caregivers, health care professionals and the public.  Our highly trained and knowledgeable staff can help you with: Understanding memory loss, dementia and Alzheimer's  Medications and other treatment options  General information about aging and brain health  Skills to provide quality care and to find the best care from professionals  Legal, financial and living-arrangement decisions Our Helpline also features: Confidential care consultation provided by master's level clinicians who can help with decision-making support, crisis assistance and education on issues families face every day  Help in a caller's preferred language using our translation service that features more than 200 languages and dialects  Referrals to local community programs, services and ongoing support     FALL PRECAUTIONS: Be cautious when walking. Scan the area for obstacles that may increase the risk of trips and falls. When getting up in the mornings, sit up at the edge of the bed for a few minutes before getting out of bed. Consider elevating the bed at the head end to avoid drop of blood pressure when getting up. Walk always in a well-lit room (use night lights in the walls). Avoid area rugs or power cords from appliances in the middle of the walkways. Use a walker or a cane if necessary and consider physical therapy for balance exercise. Get your eyesight checked regularly.  FINANCIAL OVERSIGHT: Supervision, especially oversight when making financial decisions or transactions is also recommended.  HOME SAFETY: Consider the safety of the kitchen when operating appliances like stoves, microwave oven, and blender. Consider  having supervision and share cooking responsibilities until no longer able to participate in those. Accidents with firearms and other hazards in the house  should be identified and addressed as well.   ABILITY TO BE LEFT ALONE: If patient is unable to contact 911 operator, consider using LifeLine, or when the need is there, arrange for someone to stay with patients. Smoking is a fire hazard, consider supervision or cessation. Risk of wandering should be assessed by caregiver and if detected at any point, supervision and safe proof recommendations should be instituted.  MEDICATION SUPERVISION: Inability to self-administer medication needs to be constantly addressed. Implement a mechanism to ensure safe administration of the medications.   DRIVING: Regarding driving, in patients with progressive memory problems, driving will be impaired. We advise to have someone else do the driving if trouble finding directions or if minor accidents are reported. Independent driving assessment is available to determine safety of driving.   If you are interested in the driving assessment, you can contact the following:  [employer] in [place]  [place] [phone] or [phone]      [place] refers to food and lifestyle choices that are based on the traditions of countries located on the [employer]. This way of eating has been shown to help prevent certain conditions and improve outcomes for people who have chronic diseases, like kidney disease and heart disease. What are tips for following this plan? Lifestyle  Cook and eat meals together with your family, when possible. Drink enough fluid to keep your urine clear or pale yellow. Be physically active every day. This includes: Aerobic exercise like running or swimming. Leisure activities like gardening, walking, or housework. Get 7-8 hours of sleep each night. If recommended by your health care provider, drink red wine in moderation. This means 1  glass a day for nonpregnant women and 2 glasses a day for men. A glass of wine equals 5 oz (150 mL). Reading food labels  Check the serving size of packaged foods. For foods such as rice and pasta, the serving size refers to the amount of cooked product, not dry. Check the total fat in packaged foods. Avoid foods that have saturated fat or trans fats. Check the ingredients list for added sugars, such as corn syrup. Shopping  At the grocery store, buy most of your food from the areas near the walls of the store. This includes: Fresh fruits and vegetables (produce). Grains, beans, nuts, and seeds. Some of these may be available in unpackaged forms or large amounts (in bulk). Fresh seafood. Poultry and eggs. Low-fat dairy products. Buy whole ingredients instead of prepackaged foods. Buy fresh fruits and vegetables in-season from local farmers markets. Buy frozen fruits and vegetables in resealable bags. If you do not have access to quality fresh seafood, buy precooked frozen shrimp or canned fish, such as tuna, salmon, or sardines. Buy small amounts of raw or cooked vegetables, salads, or olives from the deli or salad bar at your store. Stock your pantry so you always have certain foods on hand, such as olive oil, canned tuna, canned tomatoes, rice, pasta, and beans. Cooking  Cook foods with extra-virgin olive oil instead of using butter or other vegetable oils. Have meat as a side dish, and have vegetables or grains as your main dish. This means having meat in small portions or adding small amounts of meat to foods like pasta or  stew. Use beans or vegetables instead of meat in common dishes like chili or lasagna. Experiment with different cooking methods. Try roasting or broiling vegetables instead of steaming or sauteing them. Add frozen vegetables to soups, stews, pasta, or rice. Add nuts or seeds for added healthy fat at each meal. You can add these to yogurt, salads, or vegetable  dishes. Marinate fish or vegetables using olive oil, lemon juice, garlic, and fresh herbs. Meal planning  Plan to eat 1 vegetarian meal one day each week. Try to work up to 2 vegetarian meals, if possible. Eat seafood 2 or more times a week. Have healthy snacks readily available, such as: Vegetable sticks with hummus. Greek yogurt. Fruit and nut trail mix. Eat balanced meals throughout the week. This includes: Fruit: 2-3 servings a day Vegetables: 4-5 servings a day Low-fat dairy: 2 servings a day Fish, poultry, or lean meat: 1 serving a day Beans and legumes: 2 or more servings a week Nuts and seeds: 1-2 servings a day Whole grains: 6-8 servings a day Extra-virgin olive oil: 3-4 servings a day Limit red meat and sweets to only a few servings a month What are my food choices? Mediterranean diet Recommended Grains: Whole-grain pasta. Brown rice. Bulgar wheat. Polenta. Couscous. Whole-wheat bread. [place]. Vegetables: Artichokes. Beets. Broccoli. Cabbage. Carrots. Eggplant. Green beans. Chard. Kale. Spinach. Onions. Leeks. Peas. Squash. Tomatoes. Peppers. Radishes. Fruits: Apples. Apricots. Avocado. Berries. Bananas. Cherries. Dates. Figs. Grapes. Lemons. Melon. Oranges. Peaches. Plums. Pomegranate. Meats and other protein foods: Beans. Almonds. Sunflower seeds. Pine nuts. Peanuts. [place]. Salmon. Scallops. Shrimp. [place]. Tilapia. Clams. Oysters. Eggs. Dairy: Low-fat milk. Cheese. Greek yogurt. Beverages: Water. Red wine. Herbal tea. Fats and oils: Extra virgin olive oil. Avocado oil. Grape seed oil. Sweets and desserts: [place] yogurt with honey. Baked apples. Poached pears. Trail mix. Seasoning and other foods: Basil. Cilantro. Coriander. Cumin. Mint. Parsley. Sage. Rosemary. Tarragon. Garlic. Oregano. Thyme. Pepper. Balsalmic vinegar. Tahini. Hummus. Tomato sauce. Olives. Mushrooms. Limit these Grains: Prepackaged pasta or rice dishes. Prepackaged cereal with added  sugar. Vegetables: Deep fried potatoes (french fries). Fruits: Fruit canned in syrup. Meats and other protein foods: Beef. Pork. Lamb. Poultry with skin. Hot dogs. Berniece Salines. Dairy: Ice cream. Sour cream. Whole milk. Beverages: Juice. Sugar-sweetened soft drinks. Beer. Liquor and spirits. Fats and oils: Butter. Canola oil. Vegetable oil. Beef fat (tallow). Lard. Sweets and desserts: Cookies. Cakes. Pies. Candy. Seasoning and other foods: Mayonnaise. Premade sauces and marinades. The items listed may not be a complete list. Talk with your dietitian about what dietary choices are right for you. Summary The Mediterranean diet includes both food and lifestyle choices. Eat a variety of fresh fruits and vegetables, beans, nuts, seeds, and whole grains. Limit the amount of red meat and sweets that you eat. Talk with your health care provider about whether it is safe for you to drink red wine in moderation. This means 1 glass a day for nonpregnant women and 2 glasses a day for men. A glass of wine equals 5 oz (150 mL). This information is not intended to replace advice given to you by your health care provider. Make sure you discuss any questions you have with your health care provider. Document Released: 01/08/2016 Document Revised: 02/10/2016 Document Reviewed: 01/08/2016 Elsevier Interactive Patient Education  2017 [place] provider has requested that you have labwork completed today. Please go to [facility] ([id number]) on the second floor of this building before leaving the office today. You do not need  to check in. If you are not called within 15 minutes please check with the front desk.

## 2022-01-12 ENCOUNTER — Other Ambulatory Visit (HOSPITAL_COMMUNITY): Payer: Self-pay

## 2022-01-12 ENCOUNTER — Encounter: Payer: Medicare Other | Attending: Physical Medicine & Rehabilitation | Admitting: Registered Nurse

## 2022-01-12 ENCOUNTER — Encounter: Payer: Self-pay | Admitting: Registered Nurse

## 2022-01-12 VITALS — BP 149/81 | HR 69 | Ht 66.0 in | Wt 207.0 lb

## 2022-01-12 DIAGNOSIS — M62838 Other muscle spasm: Secondary | ICD-10-CM

## 2022-01-12 DIAGNOSIS — Z5181 Encounter for therapeutic drug level monitoring: Secondary | ICD-10-CM | POA: Diagnosis not present

## 2022-01-12 DIAGNOSIS — M961 Postlaminectomy syndrome, not elsewhere classified: Secondary | ICD-10-CM | POA: Diagnosis not present

## 2022-01-12 DIAGNOSIS — S32000S Wedge compression fracture of unspecified lumbar vertebra, sequela: Secondary | ICD-10-CM | POA: Diagnosis not present

## 2022-01-12 DIAGNOSIS — G8929 Other chronic pain: Secondary | ICD-10-CM | POA: Diagnosis not present

## 2022-01-12 DIAGNOSIS — M7062 Trochanteric bursitis, left hip: Secondary | ICD-10-CM | POA: Insufficient documentation

## 2022-01-12 DIAGNOSIS — R202 Paresthesia of skin: Secondary | ICD-10-CM

## 2022-01-12 DIAGNOSIS — M2550 Pain in unspecified joint: Secondary | ICD-10-CM

## 2022-01-12 DIAGNOSIS — M170 Bilateral primary osteoarthritis of knee: Secondary | ICD-10-CM | POA: Diagnosis not present

## 2022-01-12 DIAGNOSIS — Z79891 Long term (current) use of opiate analgesic: Secondary | ICD-10-CM | POA: Diagnosis not present

## 2022-01-12 DIAGNOSIS — M7061 Trochanteric bursitis, right hip: Secondary | ICD-10-CM

## 2022-01-12 DIAGNOSIS — M546 Pain in thoracic spine: Secondary | ICD-10-CM | POA: Insufficient documentation

## 2022-01-12 DIAGNOSIS — R2990 Unspecified symptoms and signs involving the nervous system: Secondary | ICD-10-CM

## 2022-01-12 DIAGNOSIS — G894 Chronic pain syndrome: Secondary | ICD-10-CM | POA: Diagnosis not present

## 2022-01-12 MED ORDER — HYDROCODONE-ACETAMINOPHEN 10-325 MG PO TABS
1.0000 | ORAL_TABLET | Freq: Four times a day (QID) | ORAL | 0 refills | Status: DC | PRN
  Filled 2022-01-12 – 2022-01-25 (×2): qty 75, 19d supply, fill #0

## 2022-01-12 NOTE — Patient Instructions (Signed)
Call Elvina Sidle on 01/25/2022 or 01/26/2022 : Regarding your refill. Call if you have any questions or concerns

## 2022-01-12 NOTE — Progress Notes (Signed)
Subjective:    Patient ID: Crystal Lee, female    DOB: 1933-07-27, 88 y.o.   MRN: [id number]  HPI: Crystal Lee is a 88 y.o. female who returns for follow up appointment for chronic pain and medication refill. She states her pain is located in her mid- lower back, bilateral hips and bilateral lower extremities. She rates her pain 5. Her current exercise regime is walking and performing stretching exercises.  [name] Morphine equivalent is 40.00 MME.   Oral Swab was Performed Today.      Pain Inventory Average Pain 5 Pain Right Now 5 My pain is sharp, burning, and aching  In the last 24 hours, has pain interfered with the following? General activity 5 Relation with others 3 Enjoyment of life 5 What TIME of day is your pain at its worst? daytime and evening Sleep (in general) Fair  Pain is worse with: walking, bending, standing, and some activites Pain improves with: rest, heat/ice, and medication Relief from Meds: 3  Family History  Problem Relation Age of Onset   Other Mother        complications from flu   Other Father        unsure of cause   Colon cancer Neg Hx    Social History   Socioeconomic History   Marital status: Widowed    Spouse name: Not on file   Number of children: 3   Years of education: 16 years   Highest education level: Not on file  Occupational History   Occupation: Retired  Tobacco Use   Smoking status: Former    Packs/day: 0.50    Years: 20.00    Total pack years: 10.00    Types: Cigarettes    Quit date: 02/03/1976    Years since quitting: 45.9   Smokeless tobacco: Never  Vaping Use   Vaping Use: Never used  Substance and Sexual Activity   Alcohol use: No   Drug use: No   Sexual activity: Not Currently    Birth control/protection: Post-menopausal  Other Topics Concern   Not on file  Social History Narrative   Lives at home with husband.   Right-handed.   No caffeine use.   Social Determinants of Health   Financial  Resource Strain: Low Risk  (08/07/2021)   Overall Financial Resource Strain (CARDIA)    Difficulty of Paying Living Expenses: Not hard at all  Food Insecurity: No Food Insecurity (08/07/2021)   Hunger Vital Sign    Worried About Running Out of Food in the Last Year: Never true    Ran Out of Food in the Last Year: Never true  Transportation Needs: No Transportation Needs (08/07/2021)   PRAPARE - Hydrologist (Medical): No    Lack of Transportation (Non-Medical): No  Physical Activity: Not on file  Stress: Not on file  Social Connections: Moderately Isolated (08/07/2021)   Social Connection and Isolation Panel [NHANES]    Frequency of Communication with Friends and Family: More than three times a week    Frequency of Social Gatherings with Friends and Family: More than three times a week    Attends Religious Services: More than 4 times per year    Active Member of [employer] or Organizations: No    Attends Archivist Meetings: Never    Marital Status: Widowed   Past Surgical History:  Procedure Laterality Date   10 HOUR [place] STUDY N/A 06/21/2018   Procedure: 24 HOUR [place] STUDY;  Surgeon: Lavena Bullion, DO;  Location: [facility];  Service: Gastroenterology;  Laterality: N/A;  with impedance   ABDOMINAL HYSTERECTOMY  2010   ANAL RECTAL MANOMETRY N/A 09/19/2019   Procedure: ANO RECTAL MANOMETRY;  Surgeon: Mauri Pole, MD;  Location: [facility];  Service: Endoscopy;  Laterality: N/A;   [place]   BIOPSY  06/10/2020   Procedure: BIOPSY;  Surgeon: Lavena Bullion, DO;  Location: [facility];  Service: Gastroenterology;;  EGD and COLON   BREAST SURGERY     CATARACT EXTRACTION Bilateral 2011   CHOLECYSTECTOMY     COLON RESECTION N/A 06/12/2020   Procedure: LAPAROSCOPIC COLON RESECTION;  Surgeon: [name], MD;  [facility]: [facility];  Service: General;  Laterality: N/A;   COLONOSCOPY WITH PROPOFOL N/A 06/10/2020    Procedure: COLONOSCOPY WITH PROPOFOL;  Surgeon: Lavena Bullion, DO;  [facility];  Service: Gastroenterology;  Laterality: N/A;   ESOPHAGEAL MANOMETRY N/A 06/21/2018   Procedure: ESOPHAGEAL MANOMETRY (EM);  Surgeon: Lavena Bullion, DO;  [facility];  Service: Gastroenterology;  Laterality: N/A;   ESOPHAGOGASTRODUODENOSCOPY (EGD) WITH PROPOFOL N/A 06/10/2020   Procedure: ESOPHAGOGASTRODUODENOSCOPY (EGD) WITH PROPOFOL;  Surgeon: Lavena Bullion, DO;  [facility];  Service: Gastroenterology;  Laterality: N/A;   GALLBLADDER SURGERY  2015   INCISIONAL HERNIA REPAIR N/A 09/16/2021   Procedure: OPEN INCISIONAL HERNIA REPAIR WITH MESH;  Surgeon: [name], MD;  Location: [place];  Service: General;  Laterality: N/A;   JOINT REPLACEMENT     RT TOTAL HIP / RT [place]    [place] STUDY  06/21/2018   Procedure: [place] IMPEDANCE STUDY;  Surgeon: Lavena Bullion, DO;  [facility];  Service: Gastroenterology;;   POLYPECTOMY  06/10/2020   Procedure: POLYPECTOMY;  Surgeon: Lavena Bullion, DO;  [facility];  Service: Gastroenterology;;   SKIN CANCER EXCISION  2016   RT SIDE OF NOSE   SUBMUCOSAL TATTOO INJECTION  06/10/2020   Procedure: SUBMUCOSAL TATTOO INJECTION;  Surgeon: Lavena Bullion, DO;  [facility];  Service: Gastroenterology;;   [place]  2010   RIGHT   TOTAL HIP ARTHROPLASTY Left 05/09/2015   Procedure: LEFT TOTAL HIP ARTHROPLASTY ANTERIOR APPROACH;  Surgeon: Gaynelle Arabian, MD;  [facility]: [facility];  Service: Orthopedics;  Laterality: Left;   TOTAL KNEE ARTHROPLASTY  2001   TUMOR REMOVAL  2012   ABDOMINAL - NON CANCEROUS   Past Surgical History:  Procedure Laterality Date   41 HOUR [place] STUDY N/A 06/21/2018   Procedure: 24 HOUR [place] STUDY;  Surgeon: Lavena Bullion, DO;  [facility];  Service: Gastroenterology;  Laterality: N/A;   with impedance   ABDOMINAL HYSTERECTOMY  2010   ANAL RECTAL MANOMETRY N/A 09/19/2019   Procedure: ANO RECTAL MANOMETRY;  Surgeon: Mauri Pole, MD;  [facility];  Service: Endoscopy;  Laterality: N/A;   [place]   BIOPSY  06/10/2020   Procedure: BIOPSY;  Surgeon: Lavena Bullion, DO;  [facility];  Service: Gastroenterology;;  EGD and COLON   BREAST SURGERY     CATARACT EXTRACTION Bilateral 2011   CHOLECYSTECTOMY     COLON RESECTION N/A 06/12/2020   Procedure: LAPAROSCOPIC COLON RESECTION;  Surgeon: [name], MD;  [facility];  Service: General;  Laterality: N/A;   COLONOSCOPY WITH  PROPOFOL N/A 06/10/2020   Procedure: COLONOSCOPY WITH PROPOFOL;  Surgeon: Lavena Bullion, DO;  [facility];  Service: Gastroenterology;  Laterality: N/A;   ESOPHAGEAL MANOMETRY N/A 06/21/2018   Procedure: ESOPHAGEAL MANOMETRY (EM);  Surgeon: Lavena Bullion, DO;  [facility];  Service: Gastroenterology;  Laterality: N/A;   ESOPHAGOGASTRODUODENOSCOPY (EGD) WITH PROPOFOL N/A 06/10/2020   Procedure: ESOPHAGOGASTRODUODENOSCOPY (EGD) WITH PROPOFOL;  Surgeon: Lavena Bullion, DO;  [facility];  Service: Gastroenterology;  Laterality: N/A;   GALLBLADDER SURGERY  2015   INCISIONAL HERNIA REPAIR N/A 09/16/2021   Procedure: OPEN INCISIONAL HERNIA REPAIR WITH MESH;  Surgeon: [name], MD;  Location: [place];  Service: General;  Laterality: N/A;   JOINT REPLACEMENT     RT TOTAL HIP / RT [place]    [place] STUDY  06/21/2018   Procedure: [place] IMPEDANCE STUDY;  Surgeon: Lavena Bullion, DO;  [facility];  Service: Gastroenterology;;   POLYPECTOMY  06/10/2020   Procedure: POLYPECTOMY;  Surgeon: Lavena Bullion, DO;  [facility];  Service: Gastroenterology;;   SKIN CANCER EXCISION  2016   RT SIDE OF NOSE   SUBMUCOSAL TATTOO INJECTION  06/10/2020    Procedure: SUBMUCOSAL TATTOO INJECTION;  Surgeon: Lavena Bullion, DO;  [facility];  Service: Gastroenterology;;   [place]  2010   RIGHT   TOTAL HIP ARTHROPLASTY Left 05/09/2015   Procedure: LEFT TOTAL HIP ARTHROPLASTY ANTERIOR APPROACH;  Surgeon: Gaynelle Arabian, MD;  [facility];  Service: Orthopedics;  Laterality: Left;   TOTAL KNEE ARTHROPLASTY  2001   TUMOR REMOVAL  2012   ABDOMINAL - NON CANCEROUS   Past Medical History:  Diagnosis Date   Arthritis    Cancer ([place])    HX BREAST CANCER/ SKIN CANCER   Colon cancer ([place])    Complication of anesthesia    N/V WITH MORPHINE   Difficulty sleeping    Fractured hip ([place])    LEFT - AUG 2016   GERD (gastroesophageal reflux disease)    Hyperlipidemia    Hypertension    Melanoma ([place])    Neuropathy    Nocturia    Osteopenia    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   PONV (postoperative nausea and vomiting)    PT STATES MORPHINE CAUSED N/V   Rosacea    Sleep apnea    Stage 1 breast cancer, ER+, right (HCC) 07/04/2017   BP (!) 149/81   Pulse 69   Ht '5\' 6"'$  (1.676 m)   Wt 207 lb (93.9 kg)   SpO2 90%   BMI 33.41 kg/m   Opioid Risk Score:   Fall Risk Score:  `1  Depression screen PHQ 2/9     01/12/2022    1:42 PM 10/30/2021    1:08 PM 09/29/2021   12:54 PM 09/01/2021    1:29 PM 08/07/2021    3:30 PM 07/30/2021    1:01 PM 06/02/2021    1:20 PM  Depression screen PHQ 2/9  Decreased Interest 0 0 0 0 0 0 0  Down, Depressed, Hopeless 0 0 0 0 0 0 0  PHQ - 2 Score 0 0 0 0 0 0 0    Review of Systems  Musculoskeletal:  Positive for back pain.       Bilateral leg pain  All other systems reviewed and are negative.      Objective:  Physical Exam Vitals and nursing note reviewed.  Constitutional:      Appearance: Normal appearance.  Cardiovascular:     Rate and Rhythm: Normal rate and regular rhythm.     Pulses: Normal pulses.     Heart sounds: Normal heart  sounds.  Pulmonary:     Effort: Pulmonary effort is normal.     Breath sounds: Normal breath sounds.  Musculoskeletal:     Cervical back: Normal range of motion and neck supple.     Comments: Normal Muscle Bulk and Muscle Testing Reveals:  Upper Extremities: Full ROM and Muscle Strength 5/5 Thoracic Paraspinal Tenderness: T-7-T-9 Lumbar Paraspinal Tenderness: L-3-l-5 Lower Extremities: Full ROM and Muscle Strength 5/5 Arises from Chair Slowly using walker for support Narrow Based  Gait     Skin:    General: Skin is warm and dry.  Neurological:     Mental Status: She is alert and oriented to person, place, and time.  Psychiatric:        Mood and Affect: Mood normal.        Behavior: Behavior normal.         Assessment & Plan:  1. Chronic Bilateral Leg pain: Paresthesia: Continue to Monitor. 01/12/2022 2. Paresthesia Doreene Burke Radiculitis: Ms. Flott has weaned herself off the Lyrica due to daytime drowsiness. We will continue to monitor. 01/12/2022 3. Pain of Left Wrist/ : No complaints today. S/P Carpal Tunnel Release on 06/03/2017 by Dr. Ellene Route. Dr. Ellene Route Following. 01/12/2022. 4. Fracture of superior pubic ramus.  Dr. Wynelle Link Following.  Continue to monitor. 01/12/2022. 5. Bilateral Knee OA: Continue Voltaren Gel.  Continue to monitor. Orthopedist following. 01/12/2022 6. Polyarthralgia: Continue to alternate with heat and ice therapy. Continue current medication regime. Continue to monitor. 01/12/2022. 7. Chronic Pain Syndrome: Refilled::Hydrocodone 10/325 mg one tablet every 6 hours as needed for moderate pain #120. 01/12/2022 8. Lumbar Compression Fracture L2 and L3:  Ms. Peterkin refused physical therapy. Continue with rest/ heat therapy. Continue to Monitor 01/12/2022. 9. Muscle Spasm: Continue  Flexeril 5 mg at HS. Continue to monitor. 01/12/2022. 10. Right Shoulder Tendonitis: No complaints today. Continue HEP as Tolerated. Alternate Ice and Heat Therapy. 01/12/2022  11.  Greater Trochanter Bursitis: Continue to Monitor. Continue HEP as Tolerated. 01/12/2022   F/U in 1 Month

## 2022-01-15 LAB — DRUG TOX MONITOR 1 W/CONF, ORAL FLD
Amphetamines: NEGATIVE ng/mL (ref <10)
Barbiturates: NEGATIVE ng/mL (ref <10)
Benzodiazepines: NEGATIVE ng/mL (ref <0.50)
Buprenorphine: NEGATIVE ng/mL (ref <0.10)
Cocaine: NEGATIVE ng/mL (ref <5.0)
Codeine: NEGATIVE ng/mL (ref <2.5)
Dihydrocodeine: 3.6 ng/mL — ABNORMAL HIGH (ref <2.5)
Fentanyl: NEGATIVE ng/mL (ref <0.10)
Heroin Metabolite: NEGATIVE ng/mL (ref <1.0)
Hydrocodone: 74.9 ng/mL — ABNORMAL HIGH (ref <2.5)
Hydromorphone: NEGATIVE ng/mL (ref <2.5)
MARIJUANA: NEGATIVE ng/mL (ref <2.5)
MDMA: NEGATIVE ng/mL (ref <10)
Meprobamate: NEGATIVE ng/mL (ref <2.5)
Methadone: NEGATIVE ng/mL (ref <5.0)
Morphine: NEGATIVE ng/mL (ref <2.5)
Nicotine Metabolite: NEGATIVE ng/mL (ref <5.0)
Norhydrocodone: NEGATIVE ng/mL (ref <2.5)
Noroxycodone: NEGATIVE ng/mL (ref <2.5)
Opiates: POSITIVE ng/mL — AB (ref <2.5)
Oxycodone: NEGATIVE ng/mL (ref <2.5)
Oxymorphone: NEGATIVE ng/mL (ref <2.5)
Phencyclidine: NEGATIVE ng/mL (ref <10)
Tapentadol: NEGATIVE ng/mL (ref <5.0)
Tramadol: NEGATIVE ng/mL (ref <5.0)
Zolpidem: NEGATIVE ng/mL (ref <5.0)

## 2022-01-15 LAB — DRUG TOX ALC METAB W/CON, ORAL FLD: Alcohol Metabolite: NEGATIVE ng/mL (ref <25)

## 2022-01-19 ENCOUNTER — Telehealth: Payer: Self-pay | Admitting: *Deleted

## 2022-01-19 NOTE — Telephone Encounter (Signed)
Oral swab drug screen was consistent for prescribed medications.  ?

## 2022-01-25 ENCOUNTER — Other Ambulatory Visit (HOSPITAL_COMMUNITY): Payer: Self-pay

## 2022-01-25 DIAGNOSIS — L570 Actinic keratosis: Secondary | ICD-10-CM | POA: Diagnosis not present

## 2022-01-25 DIAGNOSIS — Z08 Encounter for follow-up examination after completed treatment for malignant neoplasm: Secondary | ICD-10-CM | POA: Diagnosis not present

## 2022-01-25 DIAGNOSIS — L814 Other melanin hyperpigmentation: Secondary | ICD-10-CM | POA: Diagnosis not present

## 2022-01-25 DIAGNOSIS — L821 Other seborrheic keratosis: Secondary | ICD-10-CM | POA: Diagnosis not present

## 2022-01-25 DIAGNOSIS — L720 Epidermal cyst: Secondary | ICD-10-CM | POA: Diagnosis not present

## 2022-01-25 DIAGNOSIS — Z85828 Personal history of other malignant neoplasm of skin: Secondary | ICD-10-CM | POA: Diagnosis not present

## 2022-01-25 DIAGNOSIS — D225 Melanocytic nevi of trunk: Secondary | ICD-10-CM | POA: Diagnosis not present

## 2022-01-26 ENCOUNTER — Telehealth: Payer: Self-pay

## 2022-01-26 ENCOUNTER — Other Ambulatory Visit (HOSPITAL_COMMUNITY): Payer: Self-pay

## 2022-01-26 MED ORDER — HYDROCODONE-ACETAMINOPHEN 10-325 MG PO TABS
1.0000 | ORAL_TABLET | Freq: Four times a day (QID) | ORAL | 0 refills | Status: DC | PRN
  Filled 2022-01-26 – 2022-02-09 (×2): qty 120, 30d supply, fill #0

## 2022-01-26 NOTE — Telephone Encounter (Signed)
Crystal Lee received #75 Hydrocodone 10-325 at the [facility] on 01/25/2022. Patient has requested a call back from you. Because she normally get #120.  Call back phone # [phone].

## 2022-01-26 NOTE — Telephone Encounter (Signed)
PMP was Reviewed.  [place] was [employer]. [place] last prescription should have been for 120 tablets. Only 75 tablets was ordered.  Placed a call to Ms. Bieda regarding the above, she verbalizes understanding.  Post dated prescription was sent to pharmacy, she verbalizes understanding.

## 2022-01-28 ENCOUNTER — Ambulatory Visit (HOSPITAL_BASED_OUTPATIENT_CLINIC_OR_DEPARTMENT_OTHER)
Admission: RE | Admit: 2022-01-28 | Discharge: 2022-01-28 | Disposition: A | Discharge status: Home / Self Care | Payer: Medicare Other | Source: Ambulatory Visit | Attending: Hematology & Oncology | Admitting: Hematology & Oncology

## 2022-01-28 ENCOUNTER — Encounter (HOSPITAL_BASED_OUTPATIENT_CLINIC_OR_DEPARTMENT_OTHER): Payer: Self-pay

## 2022-01-28 ENCOUNTER — Inpatient Hospital Stay: Payer: Medicare Other | Attending: Hematology & Oncology

## 2022-01-28 ENCOUNTER — Encounter: Payer: Self-pay | Admitting: Hematology & Oncology

## 2022-01-28 ENCOUNTER — Inpatient Hospital Stay (HOSPITAL_BASED_OUTPATIENT_CLINIC_OR_DEPARTMENT_OTHER): Payer: Medicare Other | Admitting: Hematology & Oncology

## 2022-01-28 VITALS — BP 148/69 | HR 70 | Temp 97.6°F | Resp 20 | Ht 66.0 in | Wt 209.0 lb

## 2022-01-28 DIAGNOSIS — M810 Age-related osteoporosis without current pathological fracture: Secondary | ICD-10-CM | POA: Insufficient documentation

## 2022-01-28 DIAGNOSIS — I700 Atherosclerosis of aorta: Secondary | ICD-10-CM | POA: Diagnosis not present

## 2022-01-28 DIAGNOSIS — C186 Malignant neoplasm of descending colon: Secondary | ICD-10-CM | POA: Insufficient documentation

## 2022-01-28 DIAGNOSIS — R1012 Left upper quadrant pain: Secondary | ICD-10-CM | POA: Diagnosis not present

## 2022-01-28 DIAGNOSIS — C189 Malignant neoplasm of colon, unspecified: Secondary | ICD-10-CM | POA: Diagnosis not present

## 2022-01-28 DIAGNOSIS — C184 Malignant neoplasm of transverse colon: Secondary | ICD-10-CM | POA: Insufficient documentation

## 2022-01-28 DIAGNOSIS — Z853 Personal history of malignant neoplasm of breast: Secondary | ICD-10-CM | POA: Diagnosis not present

## 2022-01-28 DIAGNOSIS — Z79899 Other long term (current) drug therapy: Secondary | ICD-10-CM | POA: Diagnosis not present

## 2022-01-28 DIAGNOSIS — I2510 Atherosclerotic heart disease of native coronary artery without angina pectoris: Secondary | ICD-10-CM | POA: Diagnosis not present

## 2022-01-28 DIAGNOSIS — R590 Localized enlarged lymph nodes: Secondary | ICD-10-CM | POA: Diagnosis not present

## 2022-01-28 LAB — CEA (IN HOUSE-CHCC): CEA (CHCC-In House): 3.81 ng/mL (ref 0.00–5.00)

## 2022-01-28 LAB — CMP (CANCER CENTER ONLY)
ALT: 43 U/L (ref 0–44)
AST: 60 U/L — ABNORMAL HIGH (ref 15–41)
Albumin: 4.2 g/dL (ref 3.5–5.0)
Alkaline Phosphatase: 111 U/L (ref 38–126)
Anion gap: 6 (ref 5–15)
BUN: 13 mg/dL (ref 8–23)
CO2: 27 mmol/L (ref 22–32)
Calcium: 9.5 mg/dL (ref 8.9–10.3)
Chloride: 105 mmol/L (ref 98–111)
Creatinine: 0.65 mg/dL (ref 0.44–1.00)
GFR, Estimated: >60 mL/min (ref >60)
Glucose, Bld: 96 mg/dL (ref 70–99)
Potassium: 3.8 mmol/L (ref 3.5–5.1)
Sodium: 138 mmol/L (ref 135–145)
Total Bilirubin: 0.6 mg/dL (ref 0.3–1.2)
Total Protein: 7.6 g/dL (ref 6.5–8.1)

## 2022-01-28 LAB — CBC WITH DIFFERENTIAL (CANCER CENTER ONLY)
Abs Immature Granulocytes: 0 10*3/uL (ref 0.00–0.07)
Basophils Absolute: 0 10*3/uL (ref 0.0–0.1)
Basophils Relative: 0 %
Eosinophils Absolute: 0.1 10*3/uL (ref 0.0–0.5)
Eosinophils Relative: 4 %
HCT: 39.7 % (ref 36.0–46.0)
Hemoglobin: 12.8 g/dL (ref 12.0–15.0)
Lymphocytes Relative: 35 %
Lymphs Abs: 1.9 10*3/uL (ref 0.7–4.0)
MCH: 29 pg (ref 26.0–34.0)
MCHC: 32.2 g/dL (ref 30.0–36.0)
MCV: 89.8 fL (ref 80.0–100.0)
Monocytes Absolute: 0.4 10*3/uL (ref 0.1–1.0)
Monocytes Relative: 7 %
Neutro Abs: 3 10*3/uL (ref 1.7–7.7)
Neutrophils Relative %: 54 %
Platelet Count: 131 10*3/uL — ABNORMAL LOW (ref 150–400)
RBC: 4.42 MIL/uL (ref 3.87–5.11)
RDW: 13.4 % (ref 11.5–15.5)
Smear Review: NORMAL
WBC Count: 5.5 10*3/uL (ref 4.0–10.5)
nRBC: 0 % (ref 0.0–0.2)

## 2022-01-28 LAB — IRON AND IRON BINDING CAPACITY (CC-WL,HP ONLY)
Iron: 87 ug/dL (ref 28–170)
Saturation Ratios: 23 % (ref 10.4–31.8)
TIBC: 386 ug/dL (ref 250–450)
UIBC: 299 ug/dL (ref 148–442)

## 2022-01-28 LAB — LACTATE DEHYDROGENASE: LDH: 183 U/L (ref 98–192)

## 2022-01-28 LAB — FERRITIN: Ferritin: 44 ng/mL (ref 11–307)

## 2022-01-28 MED ORDER — IOHEXOL 300 MG/ML  SOLN
100.0000 mL | Freq: Once | INTRAMUSCULAR | Status: AC | PRN
  Administered 2022-01-28: 100 mL via INTRAVENOUS

## 2022-01-28 NOTE — Progress Notes (Signed)
Hematology and Oncology Follow Up Visit  Crystal Lee [id number] 16-Dec-1933 88 y.o. 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 Year -- next dose on 11/2022   Interim History:  Crystal Lee is here today for follow-up.  She is still having some problems with respect to having the hernia surgery.  She have some pain in the left upper quadrant of the abdomen.  She had a CT scan that was done today.  The CT scan did not show anything that looked like malignancy.  I suppose that she may have scar tissue.  Again, this would be hard to detect.  Her last CEA level was 2.87.  She is having no problems with cough or shortness of breath.  She is having no obvious change in bowel or bladder habits.  There is been no problems with leg swelling.  She has had no fever.  She has had no headache.  Overall, I would have said that her performance status is probably [name].     Medications:  Allergies as of 01/28/2022       Reactions   Darvon [propoxyphene] Nausea And Vomiting, Palpitations   Darvocet Causes Sweats   Aricept [donepezil] Diarrhea   Adhesive [tape] Rash   Morphine And Related Nausea And Vomiting        Medication List        Accurate as of January 28, 2022  1:52 PM. If you have any questions, ask your nurse or doctor.          aspirin EC 81 MG tablet Take 81 mg by mouth daily. Swallow whole.   atorvastatin 20 MG tablet Commonly known as: LIPITOR Take 1 tablet (20 mg total) by mouth daily.   benzonatate 100 MG capsule Commonly known as: TESSALON Take 1 capsule (100 mg total) by mouth 3 (three) times daily.   Cheratussin AC  100-10 MG/5ML syrup Generic drug: guaiFENesin-codeine Take 5 mLs by mouth 3 (three) times daily as needed for cough.   cyanocobalamin 1000 MCG tablet Commonly known as: VITAMIN B12 Take 1,000 mcg by mouth daily.   cyclobenzaprine 5 MG tablet Commonly known as: FLEXERIL Take 1 tablet (5 mg total) by mouth at bedtime as needed for muscle spasms.   estradiol 0.1 MG/GM vaginal cream Commonly known as: ESTRACE VAGINAL Apply 1gm vaginally 1-3x a week as needed for comfort   estradiol 0.1 MG/GM vaginal cream Commonly known as: ESTRACE Place 0.5 g vaginally 2 (two) times a week. Place 0.5g nightly twice a week after   HYDROcodone-acetaminophen 10-325 MG tablet Commonly known as: NORCO Take 1 tablet by mouth every 6 (six) hours as needed. Do Not Fill Before 09/12//2023. Previous prescription should have been for 120 tablets.   losartan 25 MG tablet Commonly known as: COZAAR Take 1 tablet (25 mg total) by mouth daily.   methenamine 1 g tablet Commonly known as: HIPREX Take 1 tablet (1 g total) by mouth 2 (two) times daily with a meal.   mirabegron ER 50 MG Tb24 tablet Commonly known as: MYRBETRIQ Take 1 tablet (50 mg total) by mouth daily.   Multivitamin Adults Tabs Take  1 tablet by mouth daily.   nitrofurantoin (macrocrystal-monohydrate) 100 MG capsule Commonly known as: Macrobid Take 1 capsule (100 mg total) by mouth daily.   phenazopyridine 200 MG tablet Commonly known as: Pyridium Take 1 tablet (200 mg total) by mouth 3 (three) times daily as needed for pain.   Vitamin D3 1.25 MG (50000 UT) Caps Take 1 weekly        Allergies:  Allergies  Allergen Reactions   Darvon [Propoxyphene] Nausea And Vomiting and Palpitations    Darvocet Causes Sweats   Aricept [Donepezil] Diarrhea   Adhesive [Tape] Rash   Morphine And Related Nausea And Vomiting    Past Medical History, Surgical history, Social history, and Family History were reviewed and updated.  Review of  Systems: Review of Systems  Constitutional: Negative.   HENT: Negative.    Eyes: Negative.   Respiratory: Negative.    Cardiovascular: Negative.   Gastrointestinal: Negative.   Genitourinary: Negative.   Musculoskeletal: Negative.   Skin: Negative.   Neurological: Negative.   Endo/Heme/Allergies: Negative.   Psychiatric/Behavioral: Negative.       Physical Exam:  height is $RemoveB'5\' 6"'bfPgGCGZ$  (1.676 m) and weight is 209 lb (94.8 kg). Her oral temperature is 97.6 F (36.4 C). Her blood pressure is 148/69 (abnormal) and her pulse is 70. Her respiration is 20 and oxygen saturation is 100%.   Wt Readings from Last 3 Encounters:  01/28/22 209 lb (94.8 kg)  01/12/22 207 lb (93.9 kg)  01/07/22 209 lb (94.8 kg)    Physical Exam Vitals reviewed.  HENT:     Head: Normocephalic and atraumatic.  Eyes:     Pupils: Pupils are equal, round, and reactive to light.  Cardiovascular:     Rate and Rhythm: Normal rate and regular rhythm.     Heart sounds: Normal heart sounds.  Pulmonary:     Effort: Pulmonary effort is normal.     Breath sounds: Normal breath sounds.  Abdominal:     General: Bowel sounds are normal.     Palpations: Abdomen is soft.     Comments: Her abdomen is soft.  She has a healing laparotomy scar.  There is no erythema.  Her bowel sounds are decreased.  She has no obvious fluid wave.   Musculoskeletal:        General: No tenderness or deformity. Normal range of motion.     Cervical back: Normal range of motion.  Lymphadenopathy:     Cervical: No cervical adenopathy.  Skin:    General: Skin is warm and dry.     Findings: No erythema or rash.  Neurological:     Mental Status: She is alert and oriented to person, place, and time.  Psychiatric:        Behavior: Behavior normal.        Thought Content: Thought content normal.        Judgment: Judgment normal.    Lab Results  Component Value Date   WBC 1.5 (L) 01/28/2022   HGB 12.8 01/28/2022   HCT 39.7 01/28/2022   MCV  89.8 01/28/2022   PLT 131 (L) 01/28/2022   Lab Results  Component Value Date   FERRITIN 45 06/09/2020   IRON 49 06/09/2020   TIBC 320 06/09/2020   UIBC 271 06/09/2020   IRONPCTSAT 15 06/09/2020   Lab Results  Component Value Date   RETICCTPCT 1.6 06/09/2020   RBC 4.42 01/28/2022   No results found for: "KPAFRELGTCHN", "LAMBDASER", "KAPLAMBRATIO" Lab Results  Component Value  Date   IGGSERUM 1,085 02/09/2017   IGMSERUM 336 (H) 02/09/2017   No results found for: "TOTALPROTELP", "ALBUMINELP", "A1GS", "A2GS", "BETS", "BETA2SER", "GAMS", "MSPIKE", "SPEI"   Chemistry      Component Value Date/Time   NA 138 01/28/2022 1206   NA 139 02/25/2020 1627   K 3.8 01/28/2022 1206   CL 105 01/28/2022 1206   CO2 27 01/28/2022 1206   BUN 13 01/28/2022 1206   BUN 11 02/25/2020 1627   CREATININE 0.65 01/28/2022 1206      Component Value Date/Time   CALCIUM 9.5 01/28/2022 1206   ALKPHOS 111 01/28/2022 1206   AST 60 (H) 01/28/2022 1206   ALT 43 01/28/2022 1206   BILITOT 0.6 01/28/2022 1206       Impression and Plan:  Crystal Lee is a very pleasant 88 yo caucasian female with history of bilateral stage Ia breast cancer diagnosed back in 1998. She has locally advanced colon cancer stage IIIb with 3+ lymph nodes.  She completed all of her adjuvant therapy in July. 2022.  I feel bad that she is still having the problems with this abdominal discomfort.  I does not sure what could be causing this.  I suppose if she can was go back to see Dr. Ninfa Linden.  He had a little bit of confusion.  Her initial white cell count was 1.5.  However, we found that our machine that was running the CBCs was not calibrated properly.  As such, we ran the CBC on a different machine.  Her white cell count was up to 5.5.  For right now, I would like to see her back in about 6 weeks.  I just want to make sure that everything continues to go well with her abdomen.   [name], MD 8/31/20231:52 PM

## 2022-01-29 ENCOUNTER — Telehealth (INDEPENDENT_AMBULATORY_CARE_PROVIDER_SITE_OTHER): Payer: Medicare Other | Admitting: Family Medicine

## 2022-01-29 ENCOUNTER — Telehealth: Payer: Self-pay

## 2022-01-29 ENCOUNTER — Other Ambulatory Visit (INDEPENDENT_AMBULATORY_CARE_PROVIDER_SITE_OTHER): Payer: Medicare Other

## 2022-01-29 ENCOUNTER — Other Ambulatory Visit: Payer: Self-pay

## 2022-01-29 DIAGNOSIS — R350 Frequency of micturition: Secondary | ICD-10-CM

## 2022-01-29 DIAGNOSIS — R300 Dysuria: Secondary | ICD-10-CM

## 2022-01-29 LAB — URINALYSIS, ROUTINE W REFLEX MICROSCOPIC
Bilirubin Urine: NEGATIVE
Ketones, ur: NEGATIVE
Nitrite: NEGATIVE
RBC / HPF: NONE SEEN (ref 0–?)
Specific Gravity, Urine: 1.01 (ref 1.000–1.030)
Total Protein, Urine: NEGATIVE
Urine Glucose: NEGATIVE
Urobilinogen, UA: 0.2 (ref 0.0–1.0)
pH: 6.5 (ref 5.0–8.0)

## 2022-01-29 MED ORDER — CEPHALEXIN 500 MG PO CAPS: 500.0000 mg | ORAL_CAPSULE | Freq: Two times a day (BID) | ORAL | 0 refills | Status: DC

## 2022-01-29 NOTE — Telephone Encounter (Signed)
Received her urine sample results - suggestive of UTI  Culture pending Called pt- had to [facility] as no answer Will send in rx for keflex for now while culture pending Please see the MyChart message reply(ies) for my assessment and plan.  The patient gave consent for this Medical Advice Message and is aware that it may result in a bill to their insurance company as well as the possibility that this may result in a co-payment or deductible. They are an established patient, but are not seeking medical advice exclusively about a problem treated during an in person or video visit in the last 7 days. I did not recommend an in person or video visit within 7 days of my reply.  I spent a total of 5 minutes cumulative time within 7 days through [place] messaging Lamar Blinks, MD

## 2022-01-29 NOTE — Telephone Encounter (Signed)
Patient states she's been up all night peeing and with a burning sensation when she pees. She would like to know if she can drop by and leave a urine sample. Please advise

## 2022-01-29 NOTE — Telephone Encounter (Signed)
-----   Message from Crystal Napoleon, MD sent at 01/29/2022  8:08 AM EDT ----- Call and let her know that we rechecked her white cell count.  They her white cell count is actually 5.5.  I knew this would be the case.  We found out that our machine that had run her white blood cell count had a malfunction.  Please do not let her worry about her white cell count now.  Thanks.  Laurey Arrow

## 2022-01-29 NOTE — Telephone Encounter (Signed)
FYI: Called pt- she will come by to leave a sample.

## 2022-01-29 NOTE — Telephone Encounter (Signed)
-----   Message from [name], MD sent at 01/29/2022  8:08 AM EDT ----- Call and let her know that we rechecked her white cell count.  They her white cell count is actually 5.5.  I knew this would be the case.  We found out that our machine that had run her white blood cell count had a malfunction.  Please do not let her worry about her white cell count now.  Thanks.  Laurey Arrow

## 2022-01-29 NOTE — Addendum Note (Signed)
Addended by: Lamar Blinks C on: 01/29/2022 05:43 PM   Modules accepted: Orders

## 2022-01-29 NOTE — Telephone Encounter (Signed)
Spoke with patient in the lobby of the cancer center patient stating "I saw you called and I was downstairs for an appointment so I came back" Per Dr. Antonieta Pert request pt made aware that her white blood cell count was rechecked and her white count is normal. Pt made aware that her result is 5.5 and she has no need to worry. Pt started tearing up and very thankful for the good news. Pt verbalized understanding of the result and had no further questions.

## 2022-01-29 NOTE — Telephone Encounter (Signed)
Attempted to call patient with no answer. Will attempt later today.

## 2022-01-30 LAB — URINE CULTURE
MICRO NUMBER:: 13864350
SPECIMEN QUALITY:: ADEQUATE

## 2022-01-30 NOTE — Telephone Encounter (Signed)
Received her urine culture- mixed bacteria   Adequate   Sample Source URINE   STATUS: FINAL   Result: Less than 10,000 CFU/mL of single Gram negative organism isolated. No further testing will be performed. If clinically indicated, recollection using a method to minimize contamination, with prompt transfer to Urine Culture Transport Tube, is recommended.   Called patient, she reports she still has urinary frequency but otherwise feels pretty okay.  Her UA was suggestive of UTI, no red cells on urine micro I recommended that she continue her antibiotic for the time being, please let me know if not seeing improvement by early next week.  If getting worse in the meantime seek further care.  She states agreement and understanding

## 2022-02-08 ENCOUNTER — Ambulatory Visit (INDEPENDENT_AMBULATORY_CARE_PROVIDER_SITE_OTHER): Payer: Medicare Other | Admitting: Obstetrics and Gynecology

## 2022-02-08 ENCOUNTER — Other Ambulatory Visit (HOSPITAL_COMMUNITY): Payer: Self-pay

## 2022-02-08 ENCOUNTER — Encounter: Payer: Self-pay | Admitting: Obstetrics and Gynecology

## 2022-02-08 VITALS — BP 126/79 | HR 69

## 2022-02-08 DIAGNOSIS — N952 Postmenopausal atrophic vaginitis: Secondary | ICD-10-CM

## 2022-02-08 DIAGNOSIS — R351 Nocturia: Secondary | ICD-10-CM | POA: Diagnosis not present

## 2022-02-08 MED ORDER — ESTRADIOL 0.1 MG/GM VA CREA
0.5000 g | TOPICAL_CREAM | VAGINAL | 11 refills | Status: DC
  Filled 2022-02-08: qty 42.5, 90d supply, fill #0

## 2022-02-08 NOTE — Progress Notes (Signed)
[facility] Return Visit  SUBJECTIVE  History of Present Illness: Crystal Lee is a 88 y.o. female seen in follow-up for recurrent UTI and overactive bladder  Has not been able to get the estrogen cream- [facility] would not send and she is not sure why. She is requesting it be sent to a local pharmacy.   Feels the '50mg'$  Myrbetriq has improved her symptoms. She is now waking up 3 times per night (previously 5), so feels like she is getting more rest. During the day, she is not as bothered by it. Not leaking often.   She is not using the methenamine.   Past Medical History: Patient  has a past medical history of Arthritis, Cancer ([place]), Colon cancer ([place]), Complication of anesthesia, Difficulty sleeping, Fractured hip ([place]), GERD (gastroesophageal reflux disease), Hyperlipidemia, Hypertension, Melanoma ([place]), Neuropathy, Nocturia, Osteopenia, 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 (07/04/2017), PONV (postoperative nausea and vomiting), Rosacea, Sleep apnea, and Stage 1 breast cancer, ER+, right ([place]) (07/04/2017).   Past Surgical History: She  has a past surgical history that includes Breast surgery; Cholecystectomy; Total hip arthroplasty (2010); Mastectomy (1998); Joint replacement; Total knee arthroplasty (2001); Back surgery (1973); Skin cancer excision (2016); Appendectomy (1949); Tumor removal (2012); Total hip arthroplasty (Left, 05/09/2015); Abdominal hysterectomy (2010); Gallbladder surgery (2015); Tonsillectomy (1953); Cataract extraction (Bilateral, 2011); Esophageal manometry (N/A, 06/21/2018); 24 hour ph study (N/A, 06/21/2018); PH impedance study (06/21/2018); Anal Rectal manometry (N/A, 09/19/2019); Colonoscopy with propofol (N/A, 06/10/2020); Esophagogastroduodenoscopy (egd) with propofol (N/A, 06/10/2020); biopsy (06/10/2020); Submucosal tattoo injection (06/10/2020); polypectomy (06/10/2020); Colon resection (N/A, 06/12/2020); and Incisional hernia repair (N/A, 09/16/2021).    Medications: She has a current medication list which includes the following prescription(s): aspirin ec, atorvastatin, benzonatate, cephalexin, vitamin d3, cyclobenzaprine, estradiol, estradiol, cheratussin ac, hydrocodone-acetaminophen, losartan, methenamine, mirabegron er, multivitamin adults, nitrofurantoin (macrocrystal-monohydrate), phenazopyridine, and cyanocobalamin.   Allergies: Patient is allergic to darvon [propoxyphene], aricept [donepezil], adhesive [tape], and morphine and related.   Social History: Patient  reports that she quit smoking about 46 years ago. Her smoking use included cigarettes. She has a 10.00 pack-year smoking history. She has never used smokeless tobacco. She reports that she does not drink alcohol and does not use drugs.      OBJECTIVE     Physical Exam: Vitals:   02/08/22 1338  BP: 126/79  Pulse: 69    Gen: No apparent distress, A&O x 3.  Detailed Urogynecologic Evaluation:  deferred   ASSESSMENT AND PLAN    Crystal Lee is a 88 y.o. with:  1. Nocturia   2. Vaginal atrophy      rUTI - Estrace cream resent. If she needs alternative Rx (premarin), asked patient to notify the office.  2. Nocturia - Continue Myrbetriq '50mg'$  daily.   Follow up 6 months or sooner if needed  Jaquita Folds, MD  Time spent: I spent 17 minutes dedicated to the care of this patient on the date of this encounter to include pre-visit review of records, face-to-face time with the patient and post visit documentation and ordering medication/ testing.

## 2022-02-09 ENCOUNTER — Other Ambulatory Visit (HOSPITAL_COMMUNITY): Payer: Self-pay

## 2022-02-20 NOTE — Patient Instructions (Incomplete)
Good to see you again today Recommend a covid booster and RSV this fall - and the Shingrix series- all can be given at your pharmacy   I will be in touch with your labs   [facility] to use the hydrochlorothiazide once daily as needed for hand swelling - just use when you need it!   Assuming all is well please see me in 6 months

## 2022-02-20 NOTE — Progress Notes (Unsigned)
[facility] [address] [place], [place] [address] [phone]  Date:  02/22/2022   Name:  Crystal Lee   DOB:  June 03, 1933   MRN:  [id number]  PCP:  Darreld Mclean, MD    Chief Complaint: No chief complaint on file.   History of Present Illness:  Crystal Lee is a 88 y.o. very pleasant female patient who presents with the following:  Pt seen today for a recheck- last seen by myself in May of this year -history of hypertension, hyperlipidemia, sleep apnea, breast cancer and colon cancer presently in remission Hernia repair surgery done in April  Seen by Dr Marin Olp in August - she completed her breast and colon cancer therapy in 2022  Shingrix Covid booster  Fu shot     Patient Active Problem List   Diagnosis Date Noted   Incisional hernia 09/16/2021   Pain 05/05/2021   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 behavioral disturbance, psychotic disturbance, mood disturbance, or anxiety ([place]) 05/05/2021   Stroke-like symptoms 03/25/2021   Sleep apnea    Rosacea    PONV (postoperative nausea and vomiting)    Nocturia    Neuropathy    Melanoma ([place])    Hypertension    Hyperlipidemia    Fractured hip ([place])    Difficulty sleeping    Complication of anesthesia    Cancer ([place])    Arthritis    Malignant neoplasm of descending colon ([place])    Gastric polyps    Colonic mass    Diverticulosis of colon without hemorrhage    Adenomatous polyp of sigmoid colon    Acute lower GI bleeding 06/09/2020   Lower GI bleed 06/08/2020   Acute blood loss anemia 06/08/2020   HTN (hypertension) 06/08/2020   Constipation    Encounter for orthopedic follow-up care 06/18/2019   Urinary tract infection 04/20/2019   Acute pain of right wrist 03/28/2019   Tenosynovitis, wrist 03/28/2019   Acquired trigger finger of right little finger 01/15/2019   Carpal tunnel syndrome of right wrist 01/15/2019   Aftercare 08/17/2018   Lumbar  post-laminectomy syndrome 07/31/2018   Heartburn    Fracture of superior pubic ramus ([place]) 06/16/2018   Radial styloid tenosynovitis of left hand 05/16/2018   Pain of left hand 04/20/2018   Osteopenia 04/15/2018   Dyslipidemia 02/02/2018   GERD (gastroesophageal reflux disease) 02/02/2018   Trochanteric bursitis of left hip 12/23/2017   Primary osteoarthritis of left knee 10/17/2017   History of right knee joint replacement 10/17/2017   Pain in both lower extremities 08/02/2017   Stage 1 breast cancer, ER+, right ([place]) 07/04/2017   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   Pain in right hand 06/10/2017   Trigger finger of right hand 06/10/2017   Paresthesia 02/09/2017   Low back pain 02/09/2017   Gait abnormality 02/09/2017   OA (osteoarthritis) of hip 05/09/2015    Past Medical History:  Diagnosis Date   Arthritis    Cancer ([place])    HX BREAST CANCER/ SKIN CANCER   Colon cancer ([place])    Complication of anesthesia    N/V WITH MORPHINE   Difficulty sleeping    Fractured hip ([place])    LEFT - AUG 2016   GERD (gastroesophageal reflux disease)    Hyperlipidemia    Hypertension    Melanoma ([place])    Neuropathy    Nocturia    Osteopenia    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  PONV (postoperative nausea and vomiting)    PT STATES MORPHINE CAUSED N/V   Rosacea    Sleep apnea    Stage 1 breast cancer, ER+, right ([place]) 07/04/2017    Past Surgical History:  Procedure Laterality Date   73 HOUR [place] STUDY N/A 06/21/2018   Procedure: 24 HOUR PH STUDY;  Surgeon: Lavena Bullion, DO;  [facility];  Service: Gastroenterology;  Laterality: N/A;  with impedance   ABDOMINAL HYSTERECTOMY  2010   ANAL RECTAL MANOMETRY N/A 09/19/2019   Procedure: ANO RECTAL MANOMETRY;  Surgeon: Mauri Pole, MD;  [facility];  Service: Endoscopy;  Laterality: N/A;   [place]   BIOPSY  06/10/2020   Procedure: BIOPSY;   Surgeon: Lavena Bullion, DO;  [facility];  Service: Gastroenterology;;  EGD and COLON   BREAST SURGERY     CATARACT EXTRACTION Bilateral 2011   CHOLECYSTECTOMY     COLON RESECTION N/A 06/12/2020   Procedure: LAPAROSCOPIC COLON RESECTION;  Surgeon: [name], MD;  [facility];  Service: General;  Laterality: N/A;   COLONOSCOPY WITH PROPOFOL N/A 06/10/2020   Procedure: COLONOSCOPY WITH PROPOFOL;  Surgeon: Lavena Bullion, DO;  [facility];  Service: Gastroenterology;  Laterality: N/A;   ESOPHAGEAL MANOMETRY N/A 06/21/2018   Procedure: ESOPHAGEAL MANOMETRY (EM);  Surgeon: Lavena Bullion, DO;  [facility];  Service: Gastroenterology;  Laterality: N/A;   ESOPHAGOGASTRODUODENOSCOPY (EGD) WITH PROPOFOL N/A 06/10/2020   Procedure: ESOPHAGOGASTRODUODENOSCOPY (EGD) WITH PROPOFOL;  Surgeon: Lavena Bullion, DO;  [facility];  Service: Gastroenterology;  Laterality: N/A;   GALLBLADDER SURGERY  2015   INCISIONAL HERNIA REPAIR N/A 09/16/2021   Procedure: OPEN INCISIONAL HERNIA REPAIR WITH MESH;  Surgeon: [name], MD;  Location: [facility];  Service: General;  Laterality: N/A;   JOINT REPLACEMENT     RT TOTAL HIP / RT [place]    [place] STUDY  06/21/2018   Procedure: [place] IMPEDANCE STUDY;  Surgeon: Lavena Bullion, DO;  [facility];  Service: Gastroenterology;;   POLYPECTOMY  06/10/2020   Procedure: POLYPECTOMY;  Surgeon: Lavena Bullion, DO;  [facility];  Service: Gastroenterology;;   SKIN CANCER EXCISION  2016   RT SIDE OF NOSE   SUBMUCOSAL TATTOO INJECTION  06/10/2020   Procedure: SUBMUCOSAL TATTOO INJECTION;  Surgeon: Lavena Bullion, DO;  [facility];  Service: Gastroenterology;;   [place]  2010   RIGHT   TOTAL HIP ARTHROPLASTY Left 05/09/2015   Procedure: LEFT TOTAL HIP ARTHROPLASTY ANTERIOR APPROACH;  Surgeon:  Gaynelle Arabian, MD;  [facility];  Service: Orthopedics;  Laterality: Left;   TOTAL KNEE ARTHROPLASTY  2001   TUMOR REMOVAL  2012   ABDOMINAL - NON CANCEROUS    Social History   Tobacco Use   Smoking status: Former    Packs/day: 0.50    Years: 20.00    Total pack years: 10.00    Types: Cigarettes    Quit date: 02/03/1976    Years since quitting: 46.0   Smokeless tobacco: Never  Vaping Use   Vaping Use: Never used  Substance Use Topics   Alcohol use: No   Drug use: No    Family History  Problem Relation Age of Onset   Other Mother        complications from flu   Other  Father        unsure of cause   Colon cancer Neg Hx     Allergies  Allergen Reactions   Darvon [Propoxyphene] Nausea And Vomiting and Palpitations    Darvocet Causes Sweats   Aricept [Donepezil] Diarrhea   Adhesive [Tape] Rash   Morphine And Related Nausea And Vomiting    Medication list has been reviewed and updated.  Current Outpatient Medications on File Prior to Visit  Medication Sig Dispense Refill   aspirin EC 81 MG tablet Take 81 mg by mouth daily. Swallow whole.     atorvastatin (LIPITOR) 20 MG tablet Take 1 tablet (20 mg total) by mouth daily. 90 tablet 3   benzonatate (TESSALON) 100 MG capsule Take 1 capsule (100 mg total) by mouth 3 (three) times daily. 90 capsule 0   cephALEXin (KEFLEX) 500 MG capsule Take 1 capsule (500 mg total) by mouth 2 (two) times daily. 14 capsule 0   Cholecalciferol (VITAMIN D3) 1.25 MG (50000 UT) CAPS Take 1 weekly 12 capsule 3   cyclobenzaprine (FLEXERIL) 5 MG tablet Take 1 tablet (5 mg total) by mouth at bedtime as needed for muscle spasms. (Patient not taking: Reported on 01/28/2022) 30 tablet 2   estradiol (ESTRACE VAGINAL) 0.1 MG/GM vaginal cream Apply 1gm vaginally 1-3x a week as needed for comfort (Patient not taking: Reported on 01/28/2022) 42.5 g 12   estradiol (ESTRACE) 0.1 MG/GM vaginal cream Place 0.5 g vaginally 2 (two) times a week as directed 30 g  11   guaiFENesin-codeine (CHERATUSSIN AC) 100-10 MG/5ML syrup Take 5 mLs by mouth 3 (three) times daily as needed for cough. (Patient not taking: Reported on 01/28/2022) 90 mL 0   HYDROcodone-acetaminophen (NORCO) 10-325 MG tablet Take 1 tablet by mouth every 6 (six) hours as needed.  120 tablet 0   losartan (COZAAR) 25 MG tablet Take 1 tablet (25 mg total) by mouth daily. 90 tablet 3   methenamine (HIPREX) 1 g tablet Take 1 tablet (1 g total) by mouth 2 (two) times daily with a meal. 60 tablet 11   mirabegron ER (MYRBETRIQ) 50 MG TB24 tablet Take 1 tablet (50 mg total) by mouth daily. 90 tablet 2   Multiple Vitamins-Minerals (MULTIVITAMIN ADULTS) TABS Take 1 tablet by mouth daily.     nitrofurantoin, macrocrystal-monohydrate, (MACROBID) 100 MG capsule Take 1 capsule (100 mg total) by mouth daily. (Patient not taking: Reported on 01/28/2022) 90 capsule 1   phenazopyridine (PYRIDIUM) 200 MG tablet Take 1 tablet (200 mg total) by mouth 3 (three) times daily as needed for pain. (Patient not taking: Reported on 01/28/2022) 30 tablet 3   vitamin B-12 (CYANOCOBALAMIN) 1000 MCG tablet Take 1,000 mcg by mouth daily.     No current facility-administered medications on file prior to visit.    Review of Systems:  As per HPI- otherwise negative.   Physical Examination: There were no vitals filed for this visit. There were no vitals filed for this visit. There is no height or weight on file to calculate BMI. Ideal Body Weight:    GEN: no acute distress. HEENT: Atraumatic, Normocephalic.  Ears and Nose: No external deformity. CV: RRR, No M/G/R. No JVD. No thrill. No extra heart sounds. PULM: CTA B, no wheezes, crackles, rhonchi. No retractions. No resp. distress. No accessory muscle use. ABD: S, NT, ND, +BS. No rebound. No HSM. EXTR: No c/c/e PSYCH: Normally interactive. Conversant.    Assessment and Plan: ***  Signed Lamar Blinks, MD

## 2022-02-22 ENCOUNTER — Ambulatory Visit (INDEPENDENT_AMBULATORY_CARE_PROVIDER_SITE_OTHER): Payer: Medicare Other | Admitting: Family Medicine

## 2022-02-22 ENCOUNTER — Encounter: Payer: Self-pay | Admitting: Family Medicine

## 2022-02-22 ENCOUNTER — Encounter: Payer: Self-pay | Admitting: Hematology & Oncology

## 2022-02-22 ENCOUNTER — Other Ambulatory Visit (HOSPITAL_COMMUNITY): Payer: Self-pay

## 2022-02-22 VITALS — BP 124/60 | HR 66 | Temp 97.6°F | Resp 18 | Ht 66.0 in | Wt 207.2 lb

## 2022-02-22 DIAGNOSIS — Z23 Encounter for immunization: Secondary | ICD-10-CM

## 2022-02-22 DIAGNOSIS — R252 Cramp and spasm: Secondary | ICD-10-CM | POA: Diagnosis not present

## 2022-02-22 DIAGNOSIS — M7989 Other specified soft tissue disorders: Secondary | ICD-10-CM | POA: Diagnosis not present

## 2022-02-22 DIAGNOSIS — R350 Frequency of micturition: Secondary | ICD-10-CM

## 2022-02-22 DIAGNOSIS — R2990 Unspecified symptoms and signs involving the nervous system: Secondary | ICD-10-CM

## 2022-02-22 DIAGNOSIS — M21619 Bunion of unspecified foot: Secondary | ICD-10-CM

## 2022-02-22 MED ORDER — HYDROCHLOROTHIAZIDE 12.5 MG PO TABS
12.5000 mg | ORAL_TABLET | Freq: Every day | ORAL | 1 refills | Status: DC
  Filled 2022-02-22: qty 30, 30d supply, fill #0

## 2022-02-22 MED ORDER — PHENAZOPYRIDINE HCL 200 MG PO TABS
200.0000 mg | ORAL_TABLET | Freq: Three times a day (TID) | ORAL | 3 refills | Status: DC | PRN
  Filled 2022-02-22: qty 60, 20d supply, fill #0
  Filled 2022-06-03: qty 60, 20d supply, fill #1

## 2022-02-23 ENCOUNTER — Encounter: Payer: Self-pay | Admitting: Family Medicine

## 2022-02-23 ENCOUNTER — Other Ambulatory Visit (HOSPITAL_COMMUNITY): Payer: Self-pay

## 2022-02-23 ENCOUNTER — Encounter: Payer: Medicare Other | Attending: Physical Medicine & Rehabilitation | Admitting: Registered Nurse

## 2022-02-23 VITALS — BP 137/83 | HR 78 | Ht 66.0 in | Wt 208.0 lb

## 2022-02-23 DIAGNOSIS — R2990 Unspecified symptoms and signs involving the nervous system: Secondary | ICD-10-CM

## 2022-02-23 DIAGNOSIS — R202 Paresthesia of skin: Secondary | ICD-10-CM

## 2022-02-23 DIAGNOSIS — S32000S Wedge compression fracture of unspecified lumbar vertebra, sequela: Secondary | ICD-10-CM

## 2022-02-23 DIAGNOSIS — Z79891 Long term (current) use of opiate analgesic: Secondary | ICD-10-CM | POA: Diagnosis not present

## 2022-02-23 DIAGNOSIS — M961 Postlaminectomy syndrome, not elsewhere classified: Secondary | ICD-10-CM | POA: Insufficient documentation

## 2022-02-23 DIAGNOSIS — G894 Chronic pain syndrome: Secondary | ICD-10-CM

## 2022-02-23 DIAGNOSIS — M170 Bilateral primary osteoarthritis of knee: Secondary | ICD-10-CM | POA: Diagnosis not present

## 2022-02-23 DIAGNOSIS — Z5181 Encounter for therapeutic drug level monitoring: Secondary | ICD-10-CM | POA: Diagnosis not present

## 2022-02-23 DIAGNOSIS — M2550 Pain in unspecified joint: Secondary | ICD-10-CM | POA: Insufficient documentation

## 2022-02-23 LAB — COMPREHENSIVE METABOLIC PANEL
ALT: 41 U/L — ABNORMAL HIGH (ref 0–35)
AST: 52 U/L — ABNORMAL HIGH (ref 0–37)
Albumin: 4 g/dL (ref 3.5–5.2)
Alkaline Phosphatase: 92 U/L (ref 39–117)
BUN: 15 mg/dL (ref 6–23)
CO2: 27 mEq/L (ref 19–32)
Calcium: 9.3 mg/dL (ref 8.4–10.5)
Chloride: 103 mEq/L (ref 96–112)
Creatinine, Ser: 0.63 mg/dL (ref 0.40–1.20)
GFR: 80.35 mL/min (ref >60.00)
Glucose, Bld: 94 mg/dL (ref 70–99)
Potassium: 4.1 mEq/L (ref 3.5–5.1)
Sodium: 137 mEq/L (ref 135–145)
Total Bilirubin: 0.5 mg/dL (ref 0.2–1.2)
Total Protein: 7.2 g/dL (ref 6.0–8.3)

## 2022-02-23 LAB — MAGNESIUM: Magnesium: 1.9 mg/dL (ref 1.5–2.5)

## 2022-02-23 MED ORDER — HYDROCODONE-ACETAMINOPHEN 10-325 MG PO TABS
1.0000 | ORAL_TABLET | Freq: Four times a day (QID) | ORAL | 0 refills | Status: DC | PRN
  Filled 2022-02-23 – 2022-03-10 (×2): qty 120, 30d supply, fill #0

## 2022-02-23 MED ORDER — CYCLOBENZAPRINE HCL 5 MG PO TABS
5.0000 mg | ORAL_TABLET | Freq: Every evening | ORAL | 2 refills | Status: DC | PRN
  Filled 2022-02-23: qty 30, 30d supply, fill #0

## 2022-02-23 NOTE — Progress Notes (Unsigned)
Subjective:    Patient ID: Crystal Lee, female    DOB: 08/31/1933, 88 y.o.   MRN: [id number]  HPI: Crystal Lee is a 88 y.o. female who returns for follow up appointment for chronic pain and medication refill. states *** pain is located in  ***. rates pain ***. current exercise regime is walking and performing stretching exercises.  Ms. Viera Morphine equivalent is *** MME.   Last Oral Swab was Performed on 01/12/2022, it was consistent.     Pain Inventory Average Pain 5 Pain Right Now 5 My pain is sharp, burning, stabbing, and aching  In the last 24 hours, has pain interfered with the following? General activity 6 Relation with others 3 Enjoyment of life 5 What TIME of day is your pain at its worst? evening Sleep (in general) Fair  Pain is worse with: walking, bending, standing, and some activites Pain improves with: rest, heat/ice, and medication Relief from Meds: 3  Family History  Problem Relation Age of Onset  . Other Mother        complications from flu  . Other Father        unsure of cause  . Colon cancer Neg Hx    Social History   Socioeconomic History  . Marital status: Widowed    Spouse name: Not on file  . Number of children: 3  . Years of education: 16 years  . Highest education level: Not on file  Occupational History  . Occupation: Retired  Tobacco Use  . Smoking status: Former    Packs/day: 0.50    Years: 20.00    Total pack years: 10.00    Types: Cigarettes    Quit date: 02/03/1976    Years since quitting: 46.0  . Smokeless tobacco: Never  Vaping Use  . Vaping Use: Never used  Substance and Sexual Activity  . Alcohol use: No  . Drug use: No  . Sexual activity: Not Currently    Birth control/protection: Post-menopausal  Other Topics Concern  . Not on file  Social History Narrative   Lives at home with husband.   Right-handed.   No caffeine use.   Social Determinants of Health   Financial Resource Strain: Low Risk  (08/07/2021)    Overall Financial Resource Strain (CARDIA)   . Difficulty of Paying Living Expenses: Not hard at all  Food Insecurity: No Food Insecurity (08/07/2021)   Hunger Vital Sign   . Worried About Charity fundraiser in the Last Year: Never true   . Ran Out of Food in the Last Year: Never true  Transportation Needs: No Transportation Needs (08/07/2021)   PRAPARE - Transportation   . Lack of Transportation (Medical): No   . Lack of Transportation (Non-Medical): No  Physical Activity: Not on file  Stress: Not on file  Social Connections: Moderately Isolated (08/07/2021)   Social Connection and Isolation Panel [NHANES]   . Frequency of Communication with Friends and Family: More than three times a week   . Frequency of Social Gatherings with Friends and Family: More than three times a week   . Attends Religious Services: More than 4 times per year   . Active Member of Clubs or Organizations: No   . Attends Archivist Meetings: Never   . Marital Status: Widowed   Past Surgical History:  Procedure Laterality Date  . [place] STUDY N/A 06/21/2018   Procedure: 24 HOUR PH STUDY;  Surgeon: Lavena Bullion, DO;  [facility];  Service: Gastroenterology;  Laterality: N/A;  with impedance  . ABDOMINAL HYSTERECTOMY  2010  . ANAL RECTAL MANOMETRY N/A 09/19/2019   Procedure: ANO RECTAL MANOMETRY;  Surgeon: Mauri Pole, MD;  [facility];  Service: Endoscopy;  Laterality: N/A;  . APPENDECTOMY  1949  . [place]  . BIOPSY  06/10/2020   Procedure: BIOPSY;  Surgeon: Lavena Bullion, DO;  [facility];  Service: Gastroenterology;;  EGD and COLON  . BREAST SURGERY    . CATARACT EXTRACTION Bilateral 2011  . CHOLECYSTECTOMY    . COLON RESECTION N/A 06/12/2020   Procedure: LAPAROSCOPIC COLON RESECTION;  Surgeon: [name], MD;  [facility];  Service: General;  Laterality: N/A;  . COLONOSCOPY WITH PROPOFOL N/A 06/10/2020   Procedure:  COLONOSCOPY WITH PROPOFOL;  Surgeon: Lavena Bullion, DO;  [facility];  Service: Gastroenterology;  Laterality: N/A;  . ESOPHAGEAL MANOMETRY N/A 06/21/2018   Procedure: ESOPHAGEAL MANOMETRY (EM);  Surgeon: Lavena Bullion, DO;  [facility];  Service: Gastroenterology;  Laterality: N/A;  . ESOPHAGOGASTRODUODENOSCOPY (EGD) WITH PROPOFOL N/A 06/10/2020   Procedure: ESOPHAGOGASTRODUODENOSCOPY (EGD) WITH PROPOFOL;  Surgeon: Lavena Bullion, DO;  [facility];  Service: Gastroenterology;  Laterality: N/A;  . GALLBLADDER SURGERY  2015  . INCISIONAL HERNIA REPAIR N/A 09/16/2021   Procedure: OPEN INCISIONAL HERNIA REPAIR WITH MESH;  Surgeon: [name], MD;  Location: [facility];  Service: General;  Laterality: N/A;  . JOINT REPLACEMENT     RT TOTAL HIP / RT TOTAL KNEE  . MASTECTOMY  1998   BILATERAL   . [place] IMPEDANCE STUDY  06/21/2018   Procedure: [place] IMPEDANCE STUDY;  Surgeon: Lavena Bullion, DO;  [facility];  Service: Gastroenterology;;  . POLYPECTOMY  06/10/2020   Procedure: POLYPECTOMY;  Surgeon: Lavena Bullion, DO;  [facility];  Service: Gastroenterology;;  . SKIN CANCER EXCISION  2016   RT SIDE OF NOSE  . SUBMUCOSAL TATTOO INJECTION  06/10/2020   Procedure: SUBMUCOSAL TATTOO INJECTION;  Surgeon: Lavena Bullion, DO;  [facility];  Service: Gastroenterology;;  . TONSILLECTOMY  1953  . TOTAL HIP ARTHROPLASTY  2010   RIGHT  . TOTAL HIP ARTHROPLASTY Left 05/09/2015   Procedure: LEFT TOTAL HIP ARTHROPLASTY ANTERIOR APPROACH;  Surgeon: Gaynelle Arabian, MD;  [facility];  Service: Orthopedics;  Laterality: Left;  . TOTAL KNEE ARTHROPLASTY  2001  . TUMOR REMOVAL  2012   ABDOMINAL - NON CANCEROUS   Past Surgical History:  Procedure Laterality Date  . [place] STUDY N/A 06/21/2018   Procedure: [place] STUDY;  Surgeon: Lavena Bullion, DO;  [facility];  Service: Gastroenterology;  Laterality:  N/A;  with impedance  . ABDOMINAL HYSTERECTOMY  2010  . ANAL RECTAL MANOMETRY N/A 09/19/2019   Procedure: ANO RECTAL MANOMETRY;  Surgeon: Mauri Pole, MD;  [facility];  Service: Endoscopy;  Laterality: N/A;  . APPENDECTOMY  1949  . [place]  . BIOPSY  06/10/2020   Procedure: BIOPSY;  Surgeon: Lavena Bullion, DO;  [facility];  Service: Gastroenterology;;  EGD and COLON  . BREAST SURGERY    . CATARACT EXTRACTION Bilateral 2011  . CHOLECYSTECTOMY    . COLON RESECTION N/A 06/12/2020   Procedure: LAPAROSCOPIC COLON RESECTION;  Surgeon: [name], MD;  [facility];  Service: General;  Laterality: N/A;  . COLONOSCOPY WITH PROPOFOL N/A 06/10/2020   Procedure: COLONOSCOPY WITH PROPOFOL;  Surgeon: Lavena Bullion, DO;  [facility];  Service: Gastroenterology;  Laterality: N/A;  . ESOPHAGEAL MANOMETRY N/A 06/21/2018   Procedure: ESOPHAGEAL MANOMETRY (EM);  Surgeon: Lavena Bullion, DO;  [facility];  Service: Gastroenterology;  Laterality: N/A;  . ESOPHAGOGASTRODUODENOSCOPY (EGD) WITH PROPOFOL N/A 06/10/2020   Procedure: ESOPHAGOGASTRODUODENOSCOPY (EGD) WITH PROPOFOL;  Surgeon: Lavena Bullion, DO;  [facility];  Service: Gastroenterology;  Laterality: N/A;  . GALLBLADDER SURGERY  2015  . INCISIONAL HERNIA REPAIR N/A 09/16/2021   Procedure: OPEN INCISIONAL HERNIA REPAIR WITH MESH;  Surgeon: [name], MD;  Location: [facility];  Service: General;  Laterality: N/A;  . JOINT REPLACEMENT     RT TOTAL HIP / RT TOTAL KNEE  . MASTECTOMY  1998   BILATERAL   . [place] IMPEDANCE STUDY  06/21/2018   Procedure: [place] IMPEDANCE STUDY;  Surgeon: Lavena Bullion, DO;  [facility];  Service: Gastroenterology;;  . POLYPECTOMY  06/10/2020   Procedure: POLYPECTOMY;  Surgeon: Lavena Bullion, DO;  [facility];  Service: Gastroenterology;;  . SKIN CANCER EXCISION  2016   RT SIDE OF NOSE  . SUBMUCOSAL  TATTOO INJECTION  06/10/2020   Procedure: SUBMUCOSAL TATTOO INJECTION;  Surgeon: Lavena Bullion, DO;  [facility];  Service: Gastroenterology;;  . TONSILLECTOMY  1953  . TOTAL HIP ARTHROPLASTY  2010   RIGHT  . TOTAL HIP ARTHROPLASTY Left 05/09/2015   Procedure: LEFT TOTAL HIP ARTHROPLASTY ANTERIOR APPROACH;  Surgeon: Gaynelle Arabian, MD;  [facility];  Service: Orthopedics;  Laterality: Left;  . TOTAL KNEE ARTHROPLASTY  2001  . TUMOR REMOVAL  2012   ABDOMINAL - NON CANCEROUS   Past Medical History:  Diagnosis Date  . Arthritis   . Cancer ([facility])    HX BREAST CANCER/ SKIN CANCER  . Colon cancer ([place])   . Complication of anesthesia    N/V WITH MORPHINE  . Difficulty sleeping   . Fractured hip ([place])    LEFT - AUG 2016  . GERD (gastroesophageal reflux disease)   . Hyperlipidemia   . Hypertension   . Melanoma ([place])   . Neuropathy   . Nocturia   . Osteopenia   . 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 PONV (postoperative nausea and vomiting)    PT STATES MORPHINE CAUSED N/V  . Rosacea   . Sleep apnea   . Stage 1 breast cancer, ER+, right ([facility]) 07/04/2017   BP 137/83   Pulse 78   Ht '5\' 6"'$  (1.676 m)   Wt 208 lb (94.3 kg)   SpO2 96%   BMI 33.57 kg/m   Opioid Risk Score:   Fall Risk Score:  `1  Depression screen PHQ 2/9     01/12/2022    1:42 PM 10/30/2021    1:08 PM 09/29/2021   12:54 PM 09/01/2021    1:29 PM 08/07/2021    3:30 PM 07/30/2021    1:01 PM 06/02/2021    1:20 PM  Depression screen PHQ 2/9  Decreased Interest 0 0 0 0 0 0 0  Down, Depressed, Hopeless 0 0 0 0 0 0 0  PHQ - 2 Score 0 0 0 0 0 0 0     Review of Systems  Musculoskeletal:  Positive for back pain.       Bilateral leg pain  All other systems reviewed and are negative.     Objective:   Physical Exam        Assessment &  Plan:  1. Chronic Bilateral Leg pain: Paresthesia: Continue to Monitor. 01/12/2022 2. Paresthesia Doreene Burke Radiculitis: Ms. Benthall has weaned  herself off the Lyrica due to daytime drowsiness. We will continue to monitor. 01/12/2022 3. Pain of Left Wrist/ : No complaints today. S/P Carpal Tunnel Release on 06/03/2017 by Dr. Ellene Route. Dr. Ellene Route Following. 01/12/2022. 4. Fracture of superior pubic ramus.  Dr. Wynelle Link Following.  Continue to monitor. 01/12/2022. 5. Bilateral Knee OA: Continue Voltaren Gel.  Continue to monitor. Orthopedist following. 01/12/2022 6. Polyarthralgia: Continue to alternate with heat and ice therapy. Continue current medication regime. Continue to monitor. 01/12/2022. 7. Chronic Pain Syndrome: Refilled::Hydrocodone 10/325 mg one tablet every 6 hours as needed for moderate pain #120. 01/12/2022 8. Lumbar Compression Fracture L2 and L3:  Ms. Hagey refused physical therapy. Continue with rest/ heat therapy. Continue to Monitor 01/12/2022. 9. Muscle Spasm: Continue  Flexeril 5 mg at HS. Continue to monitor. 01/12/2022. 10. Right Shoulder Tendonitis: No complaints today. Continue HEP as Tolerated. Alternate Ice and Heat Therapy. 01/12/2022  11. Greater Trochanter Bursitis: Continue to Monitor. Continue HEP as Tolerated. 01/12/2022   F/U in 1 Month

## 2022-02-24 ENCOUNTER — Other Ambulatory Visit (HOSPITAL_COMMUNITY): Payer: Self-pay

## 2022-02-24 ENCOUNTER — Encounter: Payer: Self-pay | Admitting: Registered Nurse

## 2022-02-24 DIAGNOSIS — H16203 Unspecified keratoconjunctivitis, bilateral: Secondary | ICD-10-CM | POA: Diagnosis not present

## 2022-02-24 DIAGNOSIS — H524 Presbyopia: Secondary | ICD-10-CM | POA: Diagnosis not present

## 2022-02-24 DIAGNOSIS — Z961 Presence of intraocular lens: Secondary | ICD-10-CM | POA: Diagnosis not present

## 2022-02-24 MED ORDER — VALACYCLOVIR HCL 1 G PO TABS
1000.0000 mg | ORAL_TABLET | Freq: Two times a day (BID) | ORAL | 2 refills | Status: DC
  Filled 2022-02-24: qty 60, 30d supply, fill #0
  Filled 2022-03-25: qty 60, 30d supply, fill #1
  Filled 2022-05-27 – 2022-07-30 (×2): qty 60, 30d supply, fill #2

## 2022-02-24 MED ORDER — MOXIFLOXACIN HCL 0.5 % OP SOLN
1.0000 [drp] | Freq: Three times a day (TID) | OPHTHALMIC | 2 refills | Status: DC
  Filled 2022-02-24: qty 12, 40d supply, fill #0

## 2022-02-25 ENCOUNTER — Other Ambulatory Visit (HOSPITAL_COMMUNITY): Payer: Self-pay

## 2022-02-26 ENCOUNTER — Other Ambulatory Visit (HOSPITAL_COMMUNITY): Payer: Self-pay

## 2022-03-02 ENCOUNTER — Encounter: Payer: Self-pay | Admitting: Hematology & Oncology

## 2022-03-03 DIAGNOSIS — H16203 Unspecified keratoconjunctivitis, bilateral: Secondary | ICD-10-CM | POA: Diagnosis not present

## 2022-03-09 ENCOUNTER — Other Ambulatory Visit: Payer: Self-pay

## 2022-03-09 ENCOUNTER — Inpatient Hospital Stay (HOSPITAL_BASED_OUTPATIENT_CLINIC_OR_DEPARTMENT_OTHER): Payer: Medicare Other | Admitting: Hematology & Oncology

## 2022-03-09 ENCOUNTER — Inpatient Hospital Stay: Payer: Medicare Other | Attending: Hematology & Oncology

## 2022-03-09 ENCOUNTER — Encounter: Payer: Self-pay | Admitting: Hematology & Oncology

## 2022-03-09 ENCOUNTER — Other Ambulatory Visit: Payer: Self-pay | Admitting: Family Medicine

## 2022-03-09 VITALS — BP 139/55 | HR 78 | Temp 98.0°F | Resp 18 | Ht 66.0 in | Wt 206.8 lb

## 2022-03-09 DIAGNOSIS — C186 Malignant neoplasm of descending colon: Secondary | ICD-10-CM

## 2022-03-09 DIAGNOSIS — Z9012 Acquired absence of left breast and nipple: Secondary | ICD-10-CM | POA: Diagnosis not present

## 2022-03-09 DIAGNOSIS — Z170 Estrogen receptor positive status [ER+]: Secondary | ICD-10-CM | POA: Diagnosis not present

## 2022-03-09 DIAGNOSIS — Z85118 Personal history of other malignant neoplasm of bronchus and lung: Secondary | ICD-10-CM | POA: Diagnosis not present

## 2022-03-09 DIAGNOSIS — Z79899 Other long term (current) drug therapy: Secondary | ICD-10-CM | POA: Diagnosis not present

## 2022-03-09 DIAGNOSIS — M810 Age-related osteoporosis without current pathological fracture: Secondary | ICD-10-CM | POA: Insufficient documentation

## 2022-03-09 DIAGNOSIS — C50911 Malignant neoplasm of unspecified site of right female breast: Secondary | ICD-10-CM | POA: Diagnosis not present

## 2022-03-09 DIAGNOSIS — Z7982 Long term (current) use of aspirin: Secondary | ICD-10-CM | POA: Insufficient documentation

## 2022-03-09 DIAGNOSIS — Z853 Personal history of malignant neoplasm of breast: Secondary | ICD-10-CM | POA: Diagnosis not present

## 2022-03-09 LAB — CMP (CANCER CENTER ONLY)
ALT: 40 U/L (ref 0–44)
AST: 54 U/L — ABNORMAL HIGH (ref 15–41)
Albumin: 4.2 g/dL (ref 3.5–5.0)
Alkaline Phosphatase: 90 U/L (ref 38–126)
Anion gap: 8 (ref 5–15)
BUN: 13 mg/dL (ref 8–23)
CO2: 26 mmol/L (ref 22–32)
Calcium: 10.2 mg/dL (ref 8.9–10.3)
Chloride: 104 mmol/L (ref 98–111)
Creatinine: 0.7 mg/dL (ref 0.44–1.00)
GFR, Estimated: >60 mL/min (ref >60)
Glucose, Bld: 114 mg/dL — ABNORMAL HIGH (ref 70–99)
Potassium: 4 mmol/L (ref 3.5–5.1)
Sodium: 138 mmol/L (ref 135–145)
Total Bilirubin: 0.6 mg/dL (ref 0.3–1.2)
Total Protein: 7.8 g/dL (ref 6.5–8.1)

## 2022-03-09 LAB — CBC WITH DIFFERENTIAL (CANCER CENTER ONLY)
Abs Immature Granulocytes: 0.02 10*3/uL (ref 0.00–0.07)
Basophils Absolute: 0 10*3/uL (ref 0.0–0.1)
Basophils Relative: 0 %
Eosinophils Absolute: 0.2 10*3/uL (ref 0.0–0.5)
Eosinophils Relative: 2 %
HCT: 41.2 % (ref 36.0–46.0)
Hemoglobin: 13.4 g/dL (ref 12.0–15.0)
Immature Granulocytes: 0 %
Lymphocytes Relative: 33 %
Lymphs Abs: 2.3 10*3/uL (ref 0.7–4.0)
MCH: 28.9 pg (ref 26.0–34.0)
MCHC: 32.5 g/dL (ref 30.0–36.0)
MCV: 89 fL (ref 80.0–100.0)
Monocytes Absolute: 0.5 10*3/uL (ref 0.1–1.0)
Monocytes Relative: 8 %
Neutro Abs: 4 10*3/uL (ref 1.7–7.7)
Neutrophils Relative %: 57 %
Platelet Count: 141 10*3/uL — ABNORMAL LOW (ref 150–400)
RBC: 4.63 MIL/uL (ref 3.87–5.11)
RDW: 14 % (ref 11.5–15.5)
WBC Count: 7 10*3/uL (ref 4.0–10.5)
nRBC: 0 % (ref 0.0–0.2)

## 2022-03-09 LAB — RETICULOCYTES
Immature Retic Fract: 4.8 % (ref 2.3–15.9)
RBC.: 4.63 MIL/uL (ref 3.87–5.11)
Retic Count, Absolute: 58.3 10*3/uL (ref 19.0–186.0)
Retic Ct Pct: 1.3 % (ref 0.4–3.1)

## 2022-03-09 LAB — IRON AND IRON BINDING CAPACITY (CC-WL,HP ONLY)
Iron: 65 ug/dL (ref 28–170)
Saturation Ratios: 16 % (ref 10.4–31.8)
TIBC: 398 ug/dL (ref 250–450)
UIBC: 333 ug/dL (ref 148–442)

## 2022-03-09 LAB — SAVE SMEAR(SSMR), FOR PROVIDER SLIDE REVIEW

## 2022-03-09 LAB — CEA (IN HOUSE-CHCC): CEA (CHCC-In House): 3.91 ng/mL (ref 0.00–5.00)

## 2022-03-09 LAB — FERRITIN: Ferritin: 37 ng/mL (ref 11–307)

## 2022-03-09 LAB — LACTATE DEHYDROGENASE: LDH: 181 U/L (ref 98–192)

## 2022-03-09 NOTE — Progress Notes (Signed)
Hematology and Oncology Follow Up Visit  Crystal Lee [id number] 1934/05/15 88 y.o. 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 Year -- next dose on 11/2022   Interim History:  Crystal Lee is here today for follow-up.  We last saw her back in late August.  Since then, she been doing all right.  She has not been doing all that much.  She had a problem with her white cells when we saw her.  Her white cells were incredibly low.  However, we found that this was a machine error.  When we repeat her blood work on a different machine, her blood count came back to normal.  She has had no issues with her colon cancer.  Her last CEA level back in late August was 3.81.  She has had no cough or shortness of breath.  She has had no nausea or vomiting.  She has had no change in bowel or bladder habits.  She has had no issues with bony pain.  There is been no leg swelling.  Overall, I would say that her performance status is probably ECOG 2.  .     Medications:  Allergies as of 03/09/2022       Reactions   Darvon [propoxyphene] Nausea And Vomiting, Palpitations   Darvocet Causes Sweats   Adhesive [tape] Rash   Aricept [donepezil] Diarrhea   Morphine And Related Nausea And Vomiting        Medication List        Accurate as of March 09, 2022  1:56 PM. If you have any questions, ask your nurse or doctor.          aspirin EC 81 MG tablet Take 81 mg by mouth daily. Swallow whole.   atorvastatin 20 MG tablet Commonly known as: LIPITOR Take 1 tablet (20 mg total) by mouth daily.   cyanocobalamin 1000 MCG tablet Commonly known as: VITAMIN  B12 Take 1,000 mcg by mouth daily.   cyclobenzaprine 5 MG tablet Commonly known as: FLEXERIL Take 1 tablet (5 mg total) by mouth at bedtime as needed for muscle spasms.   estradiol 0.1 MG/GM vaginal cream Commonly known as: ESTRACE VAGINAL Apply 1gm vaginally 1-3x a week as needed for comfort   estradiol 0.1 MG/GM vaginal cream Commonly known as: ESTRACE Place 0.5 g vaginally 2 (two) times a week as directed   hydrochlorothiazide 12.5 MG tablet Commonly known as: HYDRODIURIL Take 1 tablet (12.5 mg total) by mouth daily. Use once daily as needed for swelling in hands   HYDROcodone-acetaminophen 10-325 MG tablet Commonly known as: NORCO Take 1 tablet by mouth every 6 (six) hours as needed.   losartan 25 MG tablet Commonly known as: COZAAR Take 1 tablet (25 mg total) by mouth daily.   methenamine 1 g tablet Commonly known as: HIPREX Take 1 tablet (1 g total) by mouth 2 (two) times daily with a meal.   mirabegron ER 50 MG Tb24 tablet Commonly known as: MYRBETRIQ Take 1 tablet (50 mg total) by mouth daily.   moxifloxacin 0.5 % ophthalmic solution Commonly known as: VIGAMOX Place 1 drop into both eyes 3 (  three) times daily until Dr. says to stop   Multivitamin Adults Tabs Take 1 tablet by mouth daily.   phenazopyridine 200 MG tablet Commonly known as: Pyridium Take 1 tablet (200 mg total) by mouth 3 (three) times daily as needed for pain.   valACYclovir 1000 MG tablet Commonly known as: Valtrex Take 1 tablet (1,000 mg total) by mouth 2 (two) times daily.   Vitamin D3 1.25 MG (50000 UT) Caps Take 1 weekly        Allergies:  Allergies  Allergen Reactions   Darvon [Propoxyphene] Nausea And Vomiting and Palpitations    Darvocet Causes Sweats   Adhesive [Tape] Rash   Aricept [Donepezil] Diarrhea   Morphine And Related Nausea And Vomiting    Past Medical History, Surgical history, Social history, and Family History were reviewed and updated.  Review of  Systems: Review of Systems  Constitutional: Negative.   HENT: Negative.    Eyes: Negative.   Respiratory: Negative.    Cardiovascular: Negative.   Gastrointestinal: Negative.   Genitourinary: Negative.   Musculoskeletal: Negative.   Skin: Negative.   Neurological: Negative.   Endo/Heme/Allergies: Negative.   Psychiatric/Behavioral: Negative.       Physical Exam:  height is 5' 6" (1.676 m) and weight is 206 lb 12.8 oz (93.8 kg). Her oral temperature is 98 F (36.7 C). Her blood pressure is 139/55 (abnormal) and her pulse is 78. Her respiration is 18 and oxygen saturation is 97%.   Wt Readings from Last 3 Encounters:  03/09/22 206 lb 12.8 oz (93.8 kg)  02/23/22 208 lb (94.3 kg)  02/22/22 207 lb 3.2 oz (94 kg)    Physical Exam Vitals reviewed.  Constitutional:      Comments: Chest wall exam shows bilateral mastectomies.  There is no chest wall nodularity.  There is no erythema or swelling.  There is no bilateral axillary adenopathy.  HENT:     Head: Normocephalic and atraumatic.  Eyes:     Pupils: Pupils are equal, round, and reactive to light.  Cardiovascular:     Rate and Rhythm: Normal rate and regular rhythm.     Heart sounds: Normal heart sounds.  Pulmonary:     Effort: Pulmonary effort is normal.     Breath sounds: Normal breath sounds.  Abdominal:     General: Bowel sounds are normal.     Palpations: Abdomen is soft.     Comments: Her abdomen is soft.  She has a healing laparotomy scar.  There is no erythema.  Her bowel sounds are decreased.  She has no obvious fluid wave.   Musculoskeletal:        General: No tenderness or deformity. Normal range of motion.     Cervical back: Normal range of motion.  Lymphadenopathy:     Cervical: No cervical adenopathy.  Skin:    General: Skin is warm and dry.     Findings: No erythema or rash.  Neurological:     Mental Status: She is alert and oriented to person, place, and time.  Psychiatric:        Behavior: Behavior  normal.        Thought Content: Thought content normal.        Judgment: Judgment normal.     Lab Results  Component Value Date   WBC 7.0 03/09/2022   HGB 13.4 03/09/2022   HCT 41.2 03/09/2022   MCV 89.0 03/09/2022   PLT 141 (L) 03/09/2022   Lab Results  Component Value Date  FERRITIN 44 01/28/2022   IRON 87 01/28/2022   TIBC 386 01/28/2022   UIBC 299 01/28/2022   IRONPCTSAT 23 01/28/2022   Lab Results  Component Value Date   RETICCTPCT 1.3 03/09/2022   RBC 4.63 03/09/2022   No results found for: "KPAFRELGTCHN", "LAMBDASER", "KAPLAMBRATIO" Lab Results  Component Value Date   IGGSERUM 1,085 02/09/2017   IGMSERUM 336 (H) 02/09/2017   No results found for: "TOTALPROTELP", "ALBUMINELP", "A1GS", "A2GS", "BETS", "BETA2SER", "GAMS", "MSPIKE", "SPEI"   Chemistry      Component Value Date/Time   NA 138 03/09/2022 1315   NA 139 02/25/2020 1627   K 4.0 03/09/2022 1315   CL 104 03/09/2022 1315   CO2 26 03/09/2022 1315   BUN 13 03/09/2022 1315   BUN 11 02/25/2020 1627   CREATININE 0.70 03/09/2022 1315      Component Value Date/Time   CALCIUM 10.2 03/09/2022 1315   ALKPHOS 90 03/09/2022 1315   AST 54 (H) 03/09/2022 1315   ALT 40 03/09/2022 1315   BILITOT 0.6 03/09/2022 1315       Impression and Plan:  Ms. Riss is a very pleasant 88 yo caucasian female with history of bilateral stage Ia breast cancer diagnosed back in 1998. She has locally advanced colon cancer stage IIIb with 3+ lymph nodes.  She completed all of her adjuvant therapy, with Xeloda, in July. 2022.  Overall, I really think she is doing quite nicely.  I do not see any evidence of recurrence of her cancer.  I do not think we have to do any scans on her right now.  We will try to get her through the holiday season and see how everything goes.  I was happy that her quality life is doing well.  Sounds like she is going to have family coming from out of town for Thanksgiving.  I feel bad that she is  still having the problems with this abdominal discomfort.    [name], MD 10/10/20231:56 PM

## 2022-03-10 ENCOUNTER — Other Ambulatory Visit (HOSPITAL_COMMUNITY): Payer: Self-pay

## 2022-03-10 ENCOUNTER — Encounter: Payer: Self-pay | Admitting: Hematology & Oncology

## 2022-03-11 DIAGNOSIS — M2042 Other hammer toe(s) (acquired), left foot: Secondary | ICD-10-CM | POA: Diagnosis not present

## 2022-03-11 DIAGNOSIS — M2012 Hallux valgus (acquired), left foot: Secondary | ICD-10-CM | POA: Diagnosis not present

## 2022-03-11 DIAGNOSIS — L03032 Cellulitis of left toe: Secondary | ICD-10-CM | POA: Diagnosis not present

## 2022-03-11 DIAGNOSIS — I70203 Unspecified atherosclerosis of native arteries of extremities, bilateral legs: Secondary | ICD-10-CM | POA: Diagnosis not present

## 2022-03-11 DIAGNOSIS — H16043 Marginal corneal ulcer, bilateral: Secondary | ICD-10-CM | POA: Diagnosis not present

## 2022-03-11 DIAGNOSIS — L600 Ingrowing nail: Secondary | ICD-10-CM | POA: Diagnosis not present

## 2022-03-18 ENCOUNTER — Telehealth: Payer: Self-pay | Admitting: Family Medicine

## 2022-03-18 DIAGNOSIS — R7401 Elevation of levels of liver transaminase levels: Secondary | ICD-10-CM

## 2022-03-18 DIAGNOSIS — J3489 Other specified disorders of nose and nasal sinuses: Secondary | ICD-10-CM

## 2022-03-18 NOTE — Telephone Encounter (Signed)
Patient received letter from Dr. Lorelei Pont and she wanted to let us know she is okay to do an ultrasound. She also would like a referral for ENT as well regarding a runny nose that she has had for years and no prescription seems to help. Please call to advise.

## 2022-03-18 NOTE — Telephone Encounter (Signed)
Please see below.

## 2022-03-18 NOTE — Telephone Encounter (Signed)
Patient requested to be referred to Dr. Melida Quitter on [address]  [phone]

## 2022-03-22 DIAGNOSIS — H16203 Unspecified keratoconjunctivitis, bilateral: Secondary | ICD-10-CM | POA: Diagnosis not present

## 2022-03-24 DIAGNOSIS — M2012 Hallux valgus (acquired), left foot: Secondary | ICD-10-CM | POA: Diagnosis not present

## 2022-03-24 DIAGNOSIS — M792 Neuralgia and neuritis, unspecified: Secondary | ICD-10-CM | POA: Diagnosis not present

## 2022-03-24 DIAGNOSIS — L600 Ingrowing nail: Secondary | ICD-10-CM | POA: Diagnosis not present

## 2022-03-25 ENCOUNTER — Encounter: Payer: Self-pay | Admitting: Hematology & Oncology

## 2022-03-25 ENCOUNTER — Other Ambulatory Visit (HOSPITAL_COMMUNITY): Payer: Self-pay

## 2022-03-26 ENCOUNTER — Other Ambulatory Visit (HOSPITAL_COMMUNITY): Payer: Self-pay

## 2022-03-29 DIAGNOSIS — H16203 Unspecified keratoconjunctivitis, bilateral: Secondary | ICD-10-CM | POA: Diagnosis not present

## 2022-03-31 ENCOUNTER — Ambulatory Visit (HOSPITAL_BASED_OUTPATIENT_CLINIC_OR_DEPARTMENT_OTHER)
Admission: RE | Admit: 2022-03-31 | Discharge: 2022-03-31 | Disposition: A | Discharge status: Home / Self Care | Payer: Medicare Other | Source: Ambulatory Visit | Attending: Family Medicine | Admitting: Family Medicine

## 2022-03-31 DIAGNOSIS — R748 Abnormal levels of other serum enzymes: Secondary | ICD-10-CM | POA: Diagnosis not present

## 2022-03-31 DIAGNOSIS — R7401 Elevation of levels of liver transaminase levels: Secondary | ICD-10-CM | POA: Diagnosis not present

## 2022-03-31 DIAGNOSIS — Z9049 Acquired absence of other specified parts of digestive tract: Secondary | ICD-10-CM | POA: Diagnosis not present

## 2022-04-02 ENCOUNTER — Encounter: Payer: Self-pay | Admitting: Family Medicine

## 2022-04-05 ENCOUNTER — Encounter: Payer: Medicare Other | Attending: Physical Medicine & Rehabilitation | Admitting: Registered Nurse

## 2022-04-05 ENCOUNTER — Encounter: Payer: Self-pay | Admitting: Registered Nurse

## 2022-04-05 ENCOUNTER — Other Ambulatory Visit (HOSPITAL_COMMUNITY): Payer: Self-pay

## 2022-04-05 VITALS — Ht 66.0 in | Wt 210.8 lb

## 2022-04-05 DIAGNOSIS — G894 Chronic pain syndrome: Secondary | ICD-10-CM | POA: Diagnosis not present

## 2022-04-05 DIAGNOSIS — M7062 Trochanteric bursitis, left hip: Secondary | ICD-10-CM | POA: Insufficient documentation

## 2022-04-05 DIAGNOSIS — M62838 Other muscle spasm: Secondary | ICD-10-CM | POA: Insufficient documentation

## 2022-04-05 DIAGNOSIS — M170 Bilateral primary osteoarthritis of knee: Secondary | ICD-10-CM | POA: Diagnosis not present

## 2022-04-05 DIAGNOSIS — R202 Paresthesia of skin: Secondary | ICD-10-CM | POA: Insufficient documentation

## 2022-04-05 DIAGNOSIS — R2990 Unspecified symptoms and signs involving the nervous system: Secondary | ICD-10-CM | POA: Diagnosis not present

## 2022-04-05 DIAGNOSIS — M2550 Pain in unspecified joint: Secondary | ICD-10-CM | POA: Diagnosis not present

## 2022-04-05 DIAGNOSIS — S32000S Wedge compression fracture of unspecified lumbar vertebra, sequela: Secondary | ICD-10-CM | POA: Diagnosis not present

## 2022-04-05 DIAGNOSIS — M7061 Trochanteric bursitis, right hip: Secondary | ICD-10-CM | POA: Insufficient documentation

## 2022-04-05 DIAGNOSIS — M961 Postlaminectomy syndrome, not elsewhere classified: Secondary | ICD-10-CM | POA: Insufficient documentation

## 2022-04-05 DIAGNOSIS — Z5181 Encounter for therapeutic drug level monitoring: Secondary | ICD-10-CM | POA: Diagnosis not present

## 2022-04-05 DIAGNOSIS — M25511 Pain in right shoulder: Secondary | ICD-10-CM | POA: Diagnosis not present

## 2022-04-05 MED ORDER — HYDROCODONE-ACETAMINOPHEN 10-325 MG PO TABS
1.0000 | ORAL_TABLET | Freq: Four times a day (QID) | ORAL | 0 refills | Status: DC | PRN
  Filled 2022-04-05 – 2022-04-07 (×2): qty 120, 30d supply, fill #0

## 2022-04-05 NOTE — Progress Notes (Signed)
Subjective:    Patient ID: Crystal Lee, female    DOB: 05/19/1934, 88 y.o.   MRN: [id number]  HPI: Crystal Lee is a 88 y.o. female who returns for follow up appointment for chronic pain and medication refill. She states her pain is located in her right shoulder, denies any falls. Also reports lower back pain, bilateral lower extremities, bilateral hip pain bilateral knee pain and generalized joint pain.She  rates her pain 5. Her current exercise regime is walking short distances with her walker.  Crystal Lee Morphine equivalent is 40.00 MME.   Last oral swab was performed on 01/12/2022, it was consistent.    Vitals: 153/76 P 87 o2 Sats 95%  Pain Inventory Average Pain 5 Pain Right Now 5 My pain is sharp, burning, stabbing, tingling, and aching  In the last 24 hours, has pain interfered with the following? General activity 5 Relation with others 0 Enjoyment of life 5 What TIME of day is your pain at its worst? varies Sleep (in general) Good  Pain is worse with: walking, bending, standing, and some activites Pain improves with: rest, heat/ice, and medication Relief from Meds: 3  Family History  Problem Relation Age of Onset   Other Mother        complications from flu   Other Father        unsure of cause   Colon cancer Neg Hx    Social History   Socioeconomic History   Marital status: Widowed    Spouse name: Not on file   Number of children: 3   Years of education: 16 years   Highest education level: Not on file  Occupational History   Occupation: Retired  Tobacco Use   Smoking status: Former    Packs/day: 0.50    Years: 20.00    Total pack years: 10.00    Types: Cigarettes    Quit date: 02/03/1976    Years since quitting: 46.2   Smokeless tobacco: Never  Vaping Use   Vaping Use: Never used  Substance and Sexual Activity   Alcohol use: No   Drug use: No   Sexual activity: Not Currently    Birth control/protection: Post-menopausal  Other Topics Concern    Not on file  Social History Narrative   Lives at home with husband.   Right-handed.   No caffeine use.   Social Determinants of Health   Financial Resource Strain: Low Risk  (08/07/2021)   Overall Financial Resource Strain (CARDIA)    Difficulty of Paying Living Expenses: Not hard at all  Food Insecurity: No Food Insecurity (08/07/2021)   Hunger Vital Sign    Worried About Running Out of Food in the Last Year: Never true    Ran Out of Food in the Last Year: Never true  Transportation Needs: No Transportation Needs (08/07/2021)   PRAPARE - Hydrologist (Medical): No    Lack of Transportation (Non-Medical): No  Physical Activity: Not on file  Stress: Not on file  Social Connections: Moderately Isolated (08/07/2021)   Social Connection and Isolation Panel [NHANES]    Frequency of Communication with Friends and Family: More than three times a week    Frequency of Social Gatherings with Friends and Family: More than three times a week    Attends Religious Services: More than 4 times per year    Active Member of [employer] or Organizations: No    Attends Archivist Meetings: Never    Marital Status:  Widowed   Past Surgical History:  Procedure Laterality Date   24 HOUR [place] STUDY N/A 06/21/2018   Procedure: 24 HOUR PH STUDY;  Surgeon: Lavena Bullion, DO;  Location: [facility];  Service: Gastroenterology;  Laterality: N/A;  with impedance   ABDOMINAL HYSTERECTOMY  2010   ANAL RECTAL MANOMETRY N/A 09/19/2019   Procedure: ANO RECTAL MANOMETRY;  Surgeon: Mauri Pole, MD;  Location: [facility];  Service: Endoscopy;  Laterality: N/A;   [place]   BIOPSY  06/10/2020   Procedure: BIOPSY;  Surgeon: Lavena Bullion, DO;  Location: [facility];  Service: Gastroenterology;;  EGD and COLON   BREAST SURGERY     CATARACT EXTRACTION Bilateral 2011   CHOLECYSTECTOMY     COLON RESECTION N/A 06/12/2020   Procedure:  LAPAROSCOPIC COLON RESECTION;  Surgeon: [name], MD;  Location: [facility];  Service: General;  Laterality: N/A;   COLONOSCOPY WITH PROPOFOL N/A 06/10/2020   Procedure: COLONOSCOPY WITH PROPOFOL;  Surgeon: Lavena Bullion, DO;  [facility];  Service: Gastroenterology;  Laterality: N/A;   ESOPHAGEAL MANOMETRY N/A 06/21/2018   Procedure: ESOPHAGEAL MANOMETRY (EM);  Surgeon: Lavena Bullion, DO;  [facility];  Service: Gastroenterology;  Laterality: N/A;   ESOPHAGOGASTRODUODENOSCOPY (EGD) WITH PROPOFOL N/A 06/10/2020   Procedure: ESOPHAGOGASTRODUODENOSCOPY (EGD) WITH PROPOFOL;  Surgeon: Lavena Bullion, DO;  [facility];  Service: Gastroenterology;  Laterality: N/A;   GALLBLADDER SURGERY  2015   INCISIONAL HERNIA REPAIR N/A 09/16/2021   Procedure: OPEN INCISIONAL HERNIA REPAIR WITH MESH;  Surgeon: [name], MD;  Location: [facility];  Service: General;  Laterality: N/A;   JOINT REPLACEMENT     RT TOTAL HIP / RT [place]    [place] STUDY  06/21/2018   Procedure: [place] IMPEDANCE STUDY;  Surgeon: Lavena Bullion, DO;  [facility];  Service: Gastroenterology;;   POLYPECTOMY  06/10/2020   Procedure: POLYPECTOMY;  Surgeon: Lavena Bullion, DO;  [facility];  Service: Gastroenterology;;   SKIN CANCER EXCISION  2016   RT SIDE OF NOSE   SUBMUCOSAL TATTOO INJECTION  06/10/2020   Procedure: SUBMUCOSAL TATTOO INJECTION;  Surgeon: Lavena Bullion, DO;  [facility];  Service: Gastroenterology;;   [place]  2010   RIGHT   TOTAL HIP ARTHROPLASTY Left 05/09/2015   Procedure: LEFT TOTAL HIP ARTHROPLASTY ANTERIOR APPROACH;  Surgeon: Gaynelle Arabian, MD;  [facility];  Service: Orthopedics;  Laterality: Left;   TOTAL KNEE ARTHROPLASTY  2001   TUMOR REMOVAL  2012   ABDOMINAL - NON CANCEROUS   Past Surgical History:  Procedure Laterality Date   8  HOUR [place] STUDY N/A 06/21/2018   Procedure: 24 HOUR PH STUDY;  Surgeon: Lavena Bullion, DO;  [facility];  Service: Gastroenterology;  Laterality: N/A;  with impedance   ABDOMINAL HYSTERECTOMY  2010   ANAL RECTAL MANOMETRY N/A 09/19/2019   Procedure: ANO RECTAL MANOMETRY;  Surgeon: Mauri Pole, MD;  [facility];  Service: Endoscopy;  Laterality: N/A;   [place]   BIOPSY  06/10/2020   Procedure: BIOPSY;  Surgeon: Lavena Bullion, DO;  [facility];  Service: Gastroenterology;;  EGD and COLON   BREAST SURGERY     CATARACT EXTRACTION Bilateral 2011   CHOLECYSTECTOMY     COLON RESECTION N/A  06/12/2020   Procedure: LAPAROSCOPIC COLON RESECTION;  Surgeon: [name], MD;  [facility];  Service: General;  Laterality: N/A;   COLONOSCOPY WITH PROPOFOL N/A 06/10/2020   Procedure: COLONOSCOPY WITH PROPOFOL;  Surgeon: Lavena Bullion, DO;  [facility];  Service: Gastroenterology;  Laterality: N/A;   ESOPHAGEAL MANOMETRY N/A 06/21/2018   Procedure: ESOPHAGEAL MANOMETRY (EM);  Surgeon: Lavena Bullion, DO;  [facility];  Service: Gastroenterology;  Laterality: N/A;   ESOPHAGOGASTRODUODENOSCOPY (EGD) WITH PROPOFOL N/A 06/10/2020   Procedure: ESOPHAGOGASTRODUODENOSCOPY (EGD) WITH PROPOFOL;  Surgeon: Lavena Bullion, DO;  [facility];  Service: Gastroenterology;  Laterality: N/A;   GALLBLADDER SURGERY  2015   INCISIONAL HERNIA REPAIR N/A 09/16/2021   Procedure: OPEN INCISIONAL HERNIA REPAIR WITH MESH;  Surgeon: [name], MD;  Location: [facility];  Service: General;  Laterality: N/A;   JOINT REPLACEMENT     RT TOTAL HIP / RT [place]    [place] STUDY  06/21/2018   Procedure: [place] IMPEDANCE STUDY;  Surgeon: Lavena Bullion, DO;  [facility];  Service: Gastroenterology;;   POLYPECTOMY  06/10/2020   Procedure: POLYPECTOMY;  Surgeon:  Lavena Bullion, DO;  [facility];  Service: Gastroenterology;;   SKIN CANCER EXCISION  2016   RT SIDE OF NOSE   SUBMUCOSAL TATTOO INJECTION  06/10/2020   Procedure: SUBMUCOSAL TATTOO INJECTION;  Surgeon: Lavena Bullion, DO;  [facility];  Service: Gastroenterology;;   [place]  2010   RIGHT   TOTAL HIP ARTHROPLASTY Left 05/09/2015   Procedure: LEFT TOTAL HIP ARTHROPLASTY ANTERIOR APPROACH;  Surgeon: Gaynelle Arabian, MD;  [facility];  Service: Orthopedics;  Laterality: Left;   TOTAL KNEE ARTHROPLASTY  2001   TUMOR REMOVAL  2012   ABDOMINAL - NON CANCEROUS   Past Medical History:  Diagnosis Date   Arthritis    Cancer ([place])    HX BREAST CANCER/ SKIN CANCER   Colon cancer ([place])    Complication of anesthesia    N/V WITH MORPHINE   Difficulty sleeping    Fractured hip ([place])    LEFT - AUG 2016   GERD (gastroesophageal reflux disease)    Hyperlipidemia    Hypertension    Melanoma ([place])    Neuropathy    Nocturia    Osteopenia    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   PONV (postoperative nausea and vomiting)    PT STATES MORPHINE CAUSED N/V   Rosacea    Sleep apnea    Stage 1 breast cancer, ER+, right ([place]) 07/04/2017   There were no vitals taken for this visit.  Opioid Risk Score:   Fall Risk Score:  `1  Depression screen PHQ 2/9     01/12/2022    1:42 PM 10/30/2021    1:08 PM 09/29/2021   12:54 PM 09/01/2021    1:29 PM 08/07/2021    3:30 PM 07/30/2021    1:01 PM 06/02/2021    1:20 PM  Depression screen PHQ 2/9  Decreased Interest 0 0 0 0 0 0 0  Down, Depressed, Hopeless 0 0 0 0 0 0 0  PHQ - 2 Score 0 0 0 0 0 0 0     Review of Systems  Musculoskeletal:  Positive for back pain and gait problem.  All other systems reviewed and are negative.     Objective:   Physical Exam Vitals  and nursing note reviewed.  Constitutional:      Appearance: Normal appearance.  Cardiovascular:     Rate and  Rhythm: Normal rate and regular rhythm.     Pulses: Normal pulses.     Heart sounds: Normal heart sounds.  Pulmonary:     Effort: Pulmonary effort is normal.     Breath sounds: Normal breath sounds.  Musculoskeletal:     Cervical back: Normal range of motion and neck supple.     Comments: Normal Muscle Bulk and Muscle Testing Reveals:  Upper Extremities: Full ROM and Muscle Strength 5/5 Right AC Joint Tenderness  Thoracic Paraspinal Tenderness: T-1-T-3 Mainly Right Side   Lumbar Paraspinal Tenderness: L-4-L-5 Right Greater Trochanter Tenderness Lower Extremities: Full ROM and Muscle Strength 5/5 Arises from Chair slowly using walker for support Narrow Based  Gait     Skin:    General: Skin is warm and dry.  Neurological:     Mental Status: She is alert and oriented to person, place, and time.  Psychiatric:        Mood and Affect: Mood normal.        Behavior: Behavior normal.         Assessment & Plan:  1. Chronic Bilateral Leg pain: Paresthesia: Continue to Monitor. 04/05/2022 2. Paresthesia Doreene Burke Radiculitis: Ms. Mraz has weaned herself off the Lyrica due to daytime drowsiness. We will continue to monitor. 04/05/2022 3. Pain of Left Wrist/ : No complaints today. S/P Carpal Tunnel Release on 06/03/2017 by Dr. Ellene Route. Dr. Ellene Route Following. 04/05/2022. 4. Fracture of superior pubic ramus.  Dr. Wynelle Link Following.  Continue to monitor. 04/05/2022. 5. Bilateral Knee OA: Continue Voltaren Gel.  Continue to monitor. Orthopedist following. 04/05/2022 6. Polyarthralgia: Continue to alternate with heat and ice therapy. Continue current medication regime. Continue to monitor. 04/05/2022. 7. Chronic Pain Syndrome: Refilled::Hydrocodone 10/325 mg one tablet every 6 hours as needed for moderate pain #120. 04/05/2022 8. Lumbar Compression Fracture L2 and L3:  Ms. Printy refused physical therapy. Continue with rest/ heat therapy. Continue to Monitor 04/05/2022. 9. Muscle Spasm: Continue   Flexeril 5 mg at HS. Continue to monitor. 04/05/2022. 10. Right Shoulder Tendonitis: No complaints today. Continue HEP as Tolerated. Alternate Ice and Heat Therapy. 04/05/2022  11. Greater Trochanter Bursitis: Continue to Monitor. Continue HEP as Tolerated. 04/05/2022   F/U in 1 Month

## 2022-04-06 ENCOUNTER — Other Ambulatory Visit (HOSPITAL_COMMUNITY): Payer: Self-pay

## 2022-04-07 ENCOUNTER — Encounter: Payer: Self-pay | Admitting: Hematology & Oncology

## 2022-04-07 ENCOUNTER — Other Ambulatory Visit (HOSPITAL_COMMUNITY): Payer: Self-pay

## 2022-04-08 ENCOUNTER — Telehealth: Payer: Self-pay

## 2022-04-10 DIAGNOSIS — S61002A Unspecified open wound of left thumb without damage to nail, initial encounter: Secondary | ICD-10-CM | POA: Diagnosis not present

## 2022-04-10 DIAGNOSIS — W269XXA Contact with unspecified sharp object(s), initial encounter: Secondary | ICD-10-CM | POA: Diagnosis not present

## 2022-04-12 ENCOUNTER — Other Ambulatory Visit (HOSPITAL_COMMUNITY): Payer: Self-pay

## 2022-04-12 MED ORDER — HYDROCODONE-ACETAMINOPHEN 10-325 MG PO TABS
1.0000 | ORAL_TABLET | Freq: Four times a day (QID) | ORAL | 0 refills | Status: DC | PRN
  Filled 2022-04-12 – 2022-05-06 (×2): qty 120, 30d supply, fill #0

## 2022-04-12 NOTE — Telephone Encounter (Signed)
PMP was Reviewed.  Hydrocodone e-scribed today with a do not refill till 05/04/2022. To accommodate scheduled appointment Crystal Lee was called regarding the above, she verbalizes understanding.

## 2022-04-13 ENCOUNTER — Other Ambulatory Visit (HOSPITAL_COMMUNITY): Payer: Self-pay

## 2022-05-03 DIAGNOSIS — M13849 Other specified arthritis, unspecified hand: Secondary | ICD-10-CM | POA: Diagnosis not present

## 2022-05-03 DIAGNOSIS — M65342 Trigger finger, left ring finger: Secondary | ICD-10-CM | POA: Diagnosis not present

## 2022-05-03 DIAGNOSIS — M79641 Pain in right hand: Secondary | ICD-10-CM | POA: Diagnosis not present

## 2022-05-03 DIAGNOSIS — M79642 Pain in left hand: Secondary | ICD-10-CM | POA: Diagnosis not present

## 2022-05-03 DIAGNOSIS — M25531 Pain in right wrist: Secondary | ICD-10-CM | POA: Diagnosis not present

## 2022-05-03 DIAGNOSIS — M65341 Trigger finger, right ring finger: Secondary | ICD-10-CM | POA: Diagnosis not present

## 2022-05-03 DIAGNOSIS — M654 Radial styloid tenosynovitis [de Quervain]: Secondary | ICD-10-CM | POA: Diagnosis not present

## 2022-05-06 ENCOUNTER — Other Ambulatory Visit (HOSPITAL_COMMUNITY): Payer: Self-pay

## 2022-05-10 ENCOUNTER — Telehealth: Payer: Self-pay | Admitting: Family Medicine

## 2022-05-10 NOTE — Telephone Encounter (Signed)
Pt stated she was advised by pcp to get some vaccines at a local pharmacy. She ended up getting an eye infection and did not go, so now she does not remember what vaccines she was supposed to get. Please advise.

## 2022-05-10 NOTE — Telephone Encounter (Signed)
Called and lvm that at last OV Dr Lorelei Pont Recommend a covid booster and RSV this fall - and the Shingrix series- to be received at the pharmacy.

## 2022-05-25 ENCOUNTER — Encounter: Payer: Self-pay | Admitting: Hematology & Oncology

## 2022-05-25 ENCOUNTER — Other Ambulatory Visit (HOSPITAL_COMMUNITY): Payer: Self-pay

## 2022-05-25 ENCOUNTER — Encounter: Payer: Medicare Other | Attending: Physical Medicine & Rehabilitation | Admitting: Registered Nurse

## 2022-05-25 ENCOUNTER — Encounter: Payer: Self-pay | Admitting: Registered Nurse

## 2022-05-25 VITALS — Ht 66.0 in | Wt 212.0 lb

## 2022-05-25 DIAGNOSIS — G894 Chronic pain syndrome: Secondary | ICD-10-CM

## 2022-05-25 DIAGNOSIS — M2550 Pain in unspecified joint: Secondary | ICD-10-CM

## 2022-05-25 DIAGNOSIS — R202 Paresthesia of skin: Secondary | ICD-10-CM

## 2022-05-25 DIAGNOSIS — R2990 Unspecified symptoms and signs involving the nervous system: Secondary | ICD-10-CM | POA: Diagnosis not present

## 2022-05-25 DIAGNOSIS — S32000S Wedge compression fracture of unspecified lumbar vertebra, sequela: Secondary | ICD-10-CM

## 2022-05-25 DIAGNOSIS — M7062 Trochanteric bursitis, left hip: Secondary | ICD-10-CM | POA: Diagnosis not present

## 2022-05-25 DIAGNOSIS — Z5181 Encounter for therapeutic drug level monitoring: Secondary | ICD-10-CM | POA: Diagnosis not present

## 2022-05-25 DIAGNOSIS — M62838 Other muscle spasm: Secondary | ICD-10-CM | POA: Diagnosis not present

## 2022-05-25 DIAGNOSIS — M961 Postlaminectomy syndrome, not elsewhere classified: Secondary | ICD-10-CM

## 2022-05-25 DIAGNOSIS — M170 Bilateral primary osteoarthritis of knee: Secondary | ICD-10-CM

## 2022-05-25 DIAGNOSIS — M7061 Trochanteric bursitis, right hip: Secondary | ICD-10-CM

## 2022-05-25 MED ORDER — HYDROCODONE-ACETAMINOPHEN 10-325 MG PO TABS
1.0000 | ORAL_TABLET | Freq: Four times a day (QID) | ORAL | 0 refills | Status: DC | PRN
  Filled 2022-05-25 – 2022-06-03 (×2): qty 120, 30d supply, fill #0

## 2022-05-25 NOTE — Progress Notes (Signed)
Subjective:    Patient ID: Crystal Lee, female    DOB: 05/03/1934, 88 y.o.   MRN: [id number]  HPI: Crystal Lee is a 88 y.o. female who returns for follow up appointment for chronic pain and medication refill. She states her pain is located in her lower back pain, bilateral hips and bilateral knee pain. She rates her pain 5. Her current exercise regime is walking.  Crystal Lee equivalent is 40.00 MME.   Last Oral Swab was Performed on 01/12/2022, it was consistent.   [name] blood Pressure 119/70 and O2 saturation 96%    Pain Inventory Average Pain 5 Pain Right Now 5 My pain is sharp, burning, stabbing, tingling, and aching  In the last 24 hours, has pain interfered with the following? General activity 5 Relation with others 0 Enjoyment of life 5 What TIME of day is your pain at its worst? evening and night Sleep (in general) Fair  Pain is worse with: walking, bending, standing, and some activites Pain improves with: rest, medication, and heat Relief from Meds: 3   Family History  Problem Relation Age of Onset   Other Mother        complications from flu   Other Father        unsure of cause   Colon cancer Neg Hx    Social History   Socioeconomic History   Marital status: Widowed    Spouse name: Not on file   Number of children: 3   Years of education: 16 years   Highest education level: Not on file  Occupational History   Occupation: Retired  Tobacco Use   Smoking status: Former    Packs/day: 0.50    Years: 20.00    Total pack years: 10.00    Types: Cigarettes    Quit date: 02/03/1976    Years since quitting: 46.3   Smokeless tobacco: Never  Vaping Use   Vaping Use: Never used  Substance and Sexual Activity   Alcohol use: No   Drug use: No   Sexual activity: Not Currently    Birth control/protection: Post-menopausal  Other Topics Concern   Not on file  Social History Narrative   Lives at home with husband.   Right-handed.   No caffeine  use.   Social Determinants of Health   Financial Resource Strain: Low Risk  (08/07/2021)   Overall Financial Resource Strain (CARDIA)    Difficulty of Paying Living Expenses: Not hard at all  Food Insecurity: No Food Insecurity (08/07/2021)   Hunger Vital Sign    Worried About Running Out of Food in the Last Year: Never true    Ran Out of Food in the Last Year: Never true  Transportation Needs: No Transportation Needs (08/07/2021)   PRAPARE - Hydrologist (Medical): No    Lack of Transportation (Non-Medical): No  Physical Activity: Not on file  Stress: Not on file  Social Connections: Moderately Isolated (08/07/2021)   Social Connection and Isolation Panel [NHANES]    Frequency of Communication with Friends and Family: More than three times a week    Frequency of Social Gatherings with Friends and Family: More than three times a week    Attends Religious Services: More than 4 times per year    Active Member of [employer] or Organizations: No    Attends Archivist Meetings: Never    Marital Status: Widowed   Past Surgical History:  Procedure Laterality Date   27  HOUR [place] STUDY N/A 06/21/2018   Procedure: 24 HOUR PH STUDY;  Surgeon: Lavena Bullion, DO;  [facility];  Service: Gastroenterology;  Laterality: N/A;  with impedance   ABDOMINAL HYSTERECTOMY  2010   ANAL RECTAL MANOMETRY N/A 09/19/2019   Procedure: ANO RECTAL MANOMETRY;  Surgeon: Mauri Pole, MD;  [facility];  Service: Endoscopy;  Laterality: N/A;   [place]   BIOPSY  06/10/2020   Procedure: BIOPSY;  Surgeon: Lavena Bullion, DO;  [facility];  Service: Gastroenterology;;  EGD and COLON   BREAST SURGERY     CATARACT EXTRACTION Bilateral 2011   CHOLECYSTECTOMY     COLON RESECTION N/A 06/12/2020   Procedure: LAPAROSCOPIC COLON RESECTION;  Surgeon: [name], MD;  [facility];  Service: General;   Laterality: N/A;   COLONOSCOPY WITH PROPOFOL N/A 06/10/2020   Procedure: COLONOSCOPY WITH PROPOFOL;  Surgeon: Lavena Bullion, DO;  [facility];  Service: Gastroenterology;  Laterality: N/A;   ESOPHAGEAL MANOMETRY N/A 06/21/2018   Procedure: ESOPHAGEAL MANOMETRY (EM);  Surgeon: Lavena Bullion, DO;  [facility];  Service: Gastroenterology;  Laterality: N/A;   ESOPHAGOGASTRODUODENOSCOPY (EGD) WITH PROPOFOL N/A 06/10/2020   Procedure: ESOPHAGOGASTRODUODENOSCOPY (EGD) WITH PROPOFOL;  Surgeon: Lavena Bullion, DO;  [facility];  Service: Gastroenterology;  Laterality: N/A;   GALLBLADDER SURGERY  2015   INCISIONAL HERNIA REPAIR N/A 09/16/2021   Procedure: OPEN INCISIONAL HERNIA REPAIR WITH MESH;  Surgeon: [name], MD;  Location: [place];  Service: General;  Laterality: N/A;   JOINT REPLACEMENT     RT TOTAL HIP / RT [place]    [place] STUDY  06/21/2018   Procedure: [place] IMPEDANCE STUDY;  Surgeon: Lavena Bullion, DO;  [facility];  Service: Gastroenterology;;   POLYPECTOMY  06/10/2020   Procedure: POLYPECTOMY;  Surgeon: Lavena Bullion, DO;  [facility];  Service: Gastroenterology;;   SKIN CANCER EXCISION  2016   RT SIDE OF NOSE   SUBMUCOSAL TATTOO INJECTION  06/10/2020   Procedure: SUBMUCOSAL TATTOO INJECTION;  Surgeon: Lavena Bullion, DO;  [facility];  Service: Gastroenterology;;   [place]  2010   RIGHT   TOTAL HIP ARTHROPLASTY Left 05/09/2015   Procedure: LEFT TOTAL HIP ARTHROPLASTY ANTERIOR APPROACH;  Surgeon: Gaynelle Arabian, MD;  [facility];  Service: Orthopedics;  Laterality: Left;   TOTAL KNEE ARTHROPLASTY  2001   TUMOR REMOVAL  2012   ABDOMINAL - NON CANCEROUS   Past Surgical History:  Procedure Laterality Date   67 HOUR [place] STUDY N/A 06/21/2018   Procedure: 24 HOUR PH STUDY;  Surgeon: Lavena Bullion, DO;  [facility];  Service: Gastroenterology;  Laterality: N/A;  with impedance   ABDOMINAL HYSTERECTOMY  2010   ANAL RECTAL MANOMETRY N/A 09/19/2019   Procedure: ANO RECTAL MANOMETRY;  Surgeon: Mauri Pole, MD;  [facility];  Service: Endoscopy;  Laterality: N/A;   [place]   BIOPSY  06/10/2020   Procedure: BIOPSY;  Surgeon: Lavena Bullion, DO;  [facility];  Service: Gastroenterology;;  EGD and COLON   BREAST SURGERY     CATARACT EXTRACTION Bilateral 2011   CHOLECYSTECTOMY     COLON RESECTION N/A 06/12/2020   Procedure: LAPAROSCOPIC COLON RESECTION;  Surgeon: [name], MD;  [facility];  Service: General;  Laterality: N/A;   COLONOSCOPY WITH PROPOFOL N/A 06/10/2020   Procedure: COLONOSCOPY WITH PROPOFOL;  Surgeon: Lavena Bullion, DO;  [facility];  Service: Gastroenterology;  Laterality: N/A;   ESOPHAGEAL MANOMETRY N/A 06/21/2018   Procedure: ESOPHAGEAL MANOMETRY (EM);  Surgeon: Lavena Bullion, DO;  [facility];  Service: Gastroenterology;  Laterality: N/A;   ESOPHAGOGASTRODUODENOSCOPY (EGD) WITH PROPOFOL N/A 06/10/2020   Procedure: ESOPHAGOGASTRODUODENOSCOPY (EGD) WITH PROPOFOL;  Surgeon: Lavena Bullion, DO;  [facility];  Service: Gastroenterology;  Laterality: N/A;   GALLBLADDER SURGERY  2015   INCISIONAL HERNIA REPAIR N/A 09/16/2021   Procedure: OPEN INCISIONAL HERNIA REPAIR WITH MESH;  Surgeon: [name], MD;  Location: [place];  Service: General;  Laterality: N/A;   JOINT REPLACEMENT     RT TOTAL HIP / RT [place]    [place] STUDY  06/21/2018   Procedure: [place] IMPEDANCE STUDY;  Surgeon: Lavena Bullion, DO;  [facility];  Service: Gastroenterology;;   POLYPECTOMY  06/10/2020   Procedure: POLYPECTOMY;  Surgeon: Lavena Bullion, DO;  [facility];  Service: Gastroenterology;;   SKIN CANCER EXCISION  2016    RT SIDE OF NOSE   SUBMUCOSAL TATTOO INJECTION  06/10/2020   Procedure: SUBMUCOSAL TATTOO INJECTION;  Surgeon: Lavena Bullion, DO;  [facility];  Service: Gastroenterology;;   [place]  2010   RIGHT   TOTAL HIP ARTHROPLASTY Left 05/09/2015   Procedure: LEFT TOTAL HIP ARTHROPLASTY ANTERIOR APPROACH;  Surgeon: Gaynelle Arabian, MD;  [facility];  Service: Orthopedics;  Laterality: Left;   TOTAL KNEE ARTHROPLASTY  2001   TUMOR REMOVAL  2012   ABDOMINAL - NON CANCEROUS   Past Medical History:  Diagnosis Date   Arthritis    Cancer ([place])    HX BREAST CANCER/ SKIN CANCER   Colon cancer ([place])    Complication of anesthesia    N/V WITH Lee   Difficulty sleeping    Fractured hip ([place])    LEFT - AUG 2016   GERD (gastroesophageal reflux disease)    Hyperlipidemia    Hypertension    Melanoma ([place])    Neuropathy    Nocturia    Osteopenia    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  Opioid Risk Score:   Fall Risk Score: 1  Depression screen PHQ 2/9     04/05/2022    1:03 PM 01/12/2022    1:42 PM 10/30/2021    1:08 PM 09/29/2021   12:54 PM 09/01/2021    1:29 PM 08/07/2021    3:30 PM 07/30/2021    1:01 PM  Depression screen PHQ 2/9  Decreased Interest 0 0 0 0 0 0 0  Down, Depressed, Hopeless 0 0 0 0 0 0 0  PHQ - 2 Score 0 0 0 0 0 0 0     Review of Systems  Musculoskeletal:  Positive for back pain and gait problem.       Pain from mid back on both sides down to both feet.      Objective:   Physical Exam Vitals and nursing note reviewed.  Constitutional:      Appearance: Normal appearance.  Cardiovascular:     Rate and Rhythm: Normal rate and regular rhythm.     Pulses: Normal pulses.     Heart sounds: Normal  heart sounds.  Pulmonary:     Effort: Pulmonary effort is normal.     Breath sounds: Normal breath sounds.  Musculoskeletal:     Cervical back: Normal range of motion and neck supple.     Comments: Normal Muscle Bulk and Muscle Testing Reveals:  Upper Extremities: Full ROM and Muscle Strength 5/5 Lumbar Paraspinal  Tenderness: L-4-L-5 Bilateral Greater Trochanter Tenderness Lower Extremities: Full ROM and Muscle Strength 5/5 Left Lower Extremity Flexion Produces Pain into left Patella Arises from Chair slowly using walker for support Narrow Based Gait     Skin:    General: Skin is warm and dry.  Neurological:     Mental Status: She is alert and oriented to person, place, and time.  Psychiatric:        Mood and Affect: Mood normal.        Behavior: Behavior normal.         Assessment & Plan:  1. Chronic Bilateral Leg pain: Paresthesia: Continue to Monitor. 05/25/2022 2. Paresthesia Doreene Burke Radiculitis: Ms. Harrington has weaned herself off the Lyrica due to daytime drowsiness. We will continue to monitor. 05/25/2022 3. Pain of Left Wrist/ : No complaints today. S/P Carpal Tunnel Release on 06/03/2017 by Dr. Ellene Route. Dr. Ellene Route Following. 05/25/2022. 4. Fracture of superior pubic ramus.  Dr. Wynelle Link Following.  Continue to monitor. 05/25/2022. 5. Bilateral Knee OA: Continue Voltaren Gel.  Continue to monitor. Orthopedist following. 05/25/2022 6. Polyarthralgia: Continue to alternate with heat and ice therapy. Continue current medication regime. Continue to monitor. 05/25/2022. 7. Chronic Pain Syndrome: Refilled::Hydrocodone 10/325 mg one tablet every 6 hours as needed for moderate pain #120. 05/25/2022 8. Lumbar Compression Fracture L2 and L3:  Ms. Benda refused physical therapy. Continue with rest/ heat therapy. Continue to Monitor 05/25/2022. 9. Muscle Spasm: Continue  Flexeril 5 mg at HS. Continue to monitor. 05/25/2022. 10. Right Shoulder Tendonitis: No complaints today. Continue  HEP as Tolerated. Alternate Ice and Heat Therapy. 05/25/2022  11. Greater Trochanter Bursitis: Continue to Monitor. Continue HEP as Tolerated. 05/25/2022   F/U in 1 Month

## 2022-05-27 ENCOUNTER — Encounter: Payer: Self-pay | Admitting: Hematology & Oncology

## 2022-05-27 ENCOUNTER — Other Ambulatory Visit (HOSPITAL_COMMUNITY): Payer: Self-pay

## 2022-06-03 ENCOUNTER — Other Ambulatory Visit: Payer: Self-pay

## 2022-06-03 ENCOUNTER — Other Ambulatory Visit (HOSPITAL_COMMUNITY): Payer: Self-pay

## 2022-06-03 DIAGNOSIS — N952 Postmenopausal atrophic vaginitis: Secondary | ICD-10-CM

## 2022-06-03 MED ORDER — ESTRADIOL 0.1 MG/GM VA CREA: 0.5000 g | TOPICAL_CREAM | VAGINAL | 11 refills | Status: DC

## 2022-06-04 ENCOUNTER — Other Ambulatory Visit: Payer: Self-pay

## 2022-06-07 ENCOUNTER — Other Ambulatory Visit (HOSPITAL_COMMUNITY): Payer: Self-pay

## 2022-06-09 ENCOUNTER — Encounter: Payer: Self-pay | Admitting: Family

## 2022-06-09 ENCOUNTER — Other Ambulatory Visit: Payer: Self-pay

## 2022-06-09 ENCOUNTER — Inpatient Hospital Stay: Payer: Medicare Other | Attending: Hematology & Oncology

## 2022-06-09 ENCOUNTER — Inpatient Hospital Stay (HOSPITAL_BASED_OUTPATIENT_CLINIC_OR_DEPARTMENT_OTHER): Payer: Medicare Other | Admitting: Family

## 2022-06-09 VITALS — BP 144/77 | HR 92 | Temp 98.1°F | Resp 18 | Ht 66.0 in | Wt 212.1 lb

## 2022-06-09 DIAGNOSIS — Z85038 Personal history of other malignant neoplasm of large intestine: Secondary | ICD-10-CM | POA: Diagnosis not present

## 2022-06-09 DIAGNOSIS — D509 Iron deficiency anemia, unspecified: Secondary | ICD-10-CM

## 2022-06-09 DIAGNOSIS — C50911 Malignant neoplasm of unspecified site of right female breast: Secondary | ICD-10-CM

## 2022-06-09 DIAGNOSIS — M810 Age-related osteoporosis without current pathological fracture: Secondary | ICD-10-CM | POA: Insufficient documentation

## 2022-06-09 DIAGNOSIS — Z170 Estrogen receptor positive status [ER+]: Secondary | ICD-10-CM | POA: Diagnosis not present

## 2022-06-09 DIAGNOSIS — Z79899 Other long term (current) drug therapy: Secondary | ICD-10-CM | POA: Diagnosis not present

## 2022-06-09 DIAGNOSIS — C186 Malignant neoplasm of descending colon: Secondary | ICD-10-CM

## 2022-06-09 DIAGNOSIS — M818 Other osteoporosis without current pathological fracture: Secondary | ICD-10-CM | POA: Diagnosis not present

## 2022-06-09 DIAGNOSIS — Z853 Personal history of malignant neoplasm of breast: Secondary | ICD-10-CM | POA: Insufficient documentation

## 2022-06-09 DIAGNOSIS — T386X5A Adverse effect of antigonadotrophins, antiestrogens, antiandrogens, not elsewhere classified, initial encounter: Secondary | ICD-10-CM

## 2022-06-09 LAB — CBC WITH DIFFERENTIAL (CANCER CENTER ONLY)
Abs Immature Granulocytes: 0.02 10*3/uL (ref 0.00–0.07)
Basophils Absolute: 0 10*3/uL (ref 0.0–0.1)
Basophils Relative: 0 %
Eosinophils Absolute: 0.2 10*3/uL (ref 0.0–0.5)
Eosinophils Relative: 3 %
HCT: 42.9 % (ref 36.0–46.0)
Hemoglobin: 13.7 g/dL (ref 12.0–15.0)
Immature Granulocytes: 0 %
Lymphocytes Relative: 31 %
Lymphs Abs: 2.1 10*3/uL (ref 0.7–4.0)
MCH: 29.8 pg (ref 26.0–34.0)
MCHC: 31.9 g/dL (ref 30.0–36.0)
MCV: 93.5 fL (ref 80.0–100.0)
Monocytes Absolute: 0.6 10*3/uL (ref 0.1–1.0)
Monocytes Relative: 8 %
Neutro Abs: 3.8 10*3/uL (ref 1.7–7.7)
Neutrophils Relative %: 58 %
Platelet Count: 134 10*3/uL — ABNORMAL LOW (ref 150–400)
RBC: 4.59 MIL/uL (ref 3.87–5.11)
RDW: 12.2 % (ref 11.5–15.5)
WBC Count: 6.7 10*3/uL (ref 4.0–10.5)
nRBC: 0 % (ref 0.0–0.2)

## 2022-06-09 LAB — CMP (CANCER CENTER ONLY)
ALT: 29 U/L (ref 0–44)
AST: 38 U/L (ref 15–41)
Albumin: 4.4 g/dL (ref 3.5–5.0)
Alkaline Phosphatase: 100 U/L (ref 38–126)
Anion gap: 8 (ref 5–15)
BUN: 14 mg/dL (ref 8–23)
CO2: 29 mmol/L (ref 22–32)
Calcium: 10.4 mg/dL — ABNORMAL HIGH (ref 8.9–10.3)
Chloride: 103 mmol/L (ref 98–111)
Creatinine: 0.68 mg/dL (ref 0.44–1.00)
GFR, Estimated: >60 mL/min (ref >60)
Glucose, Bld: 101 mg/dL — ABNORMAL HIGH (ref 70–99)
Potassium: 4.6 mmol/L (ref 3.5–5.1)
Sodium: 140 mmol/L (ref 135–145)
Total Bilirubin: 0.6 mg/dL (ref 0.3–1.2)
Total Protein: 8.1 g/dL (ref 6.5–8.1)

## 2022-06-09 LAB — CEA (IN HOUSE-CHCC): CEA (CHCC-In House): 3.58 ng/mL (ref 0.00–5.00)

## 2022-06-09 NOTE — Progress Notes (Signed)
Hematology and Oncology Follow Up Visit  Crystal Lee [id number] [birth year] 89 y.o. 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 Year -- next dose on 11/2022   Interim History:  Crystal Lee is here today for follow-up. She is doing well but notes fatigue at times.  No fever, chills, n/v, cough, rash, dizziness, SOB, chest pain, palpitations, abdominal pain or changes in bowel or bladder habits.  No blood loss, bruising or petechiae.  No skin changes or changes to chest wall.  No adenopathy or lymphedema noted.  No swelling in her extremities.  She has chronic back pain.  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  No falls or syncope reported.  Appetite and hydration are good. Weight is stable at 212 lbs.   ECOG Performance Status: 1 - Symptomatic but completely ambulatory  Medications:  Allergies as of 06/09/2022       Reactions   Morphine Nausea And Vomiting   Propoxyphene Nausea And Vomiting, Palpitations, Other (See Comments)   Darvocet Causes Sweats   Donepezil Diarrhea, Other (See Comments)   Morphine And Related Nausea And Vomiting   Tape Rash, Other (See Comments)        Medication List        Accurate as of June 09, 2022  1:07 PM. If you have any questions, ask your nurse or doctor.          aspirin EC 81 MG tablet Take 81 mg by mouth daily. Swallow whole.   atorvastatin 20 MG tablet Commonly known as: LIPITOR Take 1 tablet (20 mg total) by mouth daily.   cyanocobalamin 1000 MCG tablet Commonly known as: VITAMIN B12 Take 1,000 mcg by mouth daily.   cyclobenzaprine 5 MG tablet Commonly  known as: FLEXERIL Take 1 tablet (5 mg total) by mouth at bedtime as needed for muscle spasms.   estradiol 0.1 MG/GM vaginal cream Commonly known as: ESTRACE Place 0.5 g vaginally 2 (two) times a week as directed What changed: Another medication with the same name was removed. Continue taking this medication, and follow the directions you see here. Changed by: Lottie Dawson, NP   hydrochlorothiazide 12.5 MG tablet Commonly known as: HYDRODIURIL Take 1 tablet (12.5 mg total) by mouth daily. Use once daily as needed for swelling in hands   HYDROcodone-acetaminophen 10-325 MG tablet Commonly known as: NORCO Take 1 tablet by mouth every 6 hours as needed. Do not fill before 06/03/22.   losartan 25 MG tablet Commonly known as: COZAAR Take 1 tablet (25 mg total) by mouth daily.   methenamine 1 g tablet Commonly known as: HIPREX Take 1 tablet (1 g total) by mouth 2 (two) times daily with a meal.   mirabegron ER 50 MG Tb24 tablet Commonly known as: MYRBETRIQ Take 1 tablet (50 mg total) by mouth daily.   moxifloxacin 0.5 % ophthalmic solution Commonly known as: VIGAMOX Place 1 drop into both eyes 3 (three) times daily until Dr. says to stop   Multivitamin Adults Tabs Take 1 tablet by mouth daily.   phenazopyridine 200 MG tablet Commonly known as: Pyridium Take 1 tablet (200 mg total)  by mouth 3 (three) times daily as needed for pain.   valACYclovir 1000 MG tablet Commonly known as: Valtrex Take 1 tablet (1,000 mg total) by mouth 2 (two) times daily.   Vitamin D3 1.25 MG (50000 UT) Caps Take 1 weekly        Allergies:  Allergies  Allergen Reactions   Morphine Nausea And Vomiting   Propoxyphene Nausea And Vomiting, Palpitations and Other (See Comments)    Darvocet Causes Sweats   Donepezil Diarrhea and Other (See Comments)   Morphine And Related Nausea And Vomiting   Tape Rash and Other (See Comments)    Past Medical History, Surgical history, Social history, and  Family History were reviewed and updated.  Review of Systems: All other 10 point review of systems is negative.   Physical Exam:  height is '5\' 6"'$  (1.676 m) and weight is 212 lb 1.9 oz (96.2 kg). Her oral temperature is 98.1 F (36.7 C). Her blood pressure is 144/77 (abnormal) and her pulse is 92. Her respiration is 18 and oxygen saturation is 98%.   Wt Readings from Last 3 Encounters:  06/09/22 212 lb 1.9 oz (96.2 kg)  05/25/22 212 lb (96.2 kg)  04/05/22 210 lb 12.8 oz (95.6 kg)    Ocular: Sclerae unicteric, pupils equal, round and reactive to light Ear-nose-throat: Oropharynx clear, dentition fair Lymphatic: No cervical or supraclavicular adenopathy Lungs no rales or rhonchi, good excursion bilaterally Heart regular rate and rhythm, no murmur appreciated Abd soft, nontender, positive bowel sounds MSK no focal spinal tenderness, no joint edema Neuro: non-focal, well-oriented, appropriate affect Breasts: Deferred   Lab Results  Component Value Date   WBC 6.7 06/09/2022   HGB 13.7 06/09/2022   HCT 42.9 06/09/2022   MCV 93.5 06/09/2022   PLT 134 (L) 06/09/2022   Lab Results  Component Value Date   FERRITIN 37 03/09/2022   IRON 65 03/09/2022   TIBC 398 03/09/2022   UIBC 333 03/09/2022   IRONPCTSAT 16 03/09/2022   Lab Results  Component Value Date   RETICCTPCT 1.3 03/09/2022   RBC 4.59 06/09/2022   No results found for: "KPAFRELGTCHN", "LAMBDASER", "KAPLAMBRATIO" Lab Results  Component Value Date   IGGSERUM 1,085 02/09/2017   IGMSERUM 336 (H) 02/09/2017   No results found for: "TOTALPROTELP", "ALBUMINELP", "A1GS", "A2GS", "BETS", "BETA2SER", "GAMS", "MSPIKE", "SPEI"   Chemistry      Component Value Date/Time   NA 140 06/09/2022 1132   NA 139 02/25/2020 1627   K 4.6 06/09/2022 1132   CL 103 06/09/2022 1132   CO2 29 06/09/2022 1132   BUN 14 06/09/2022 1132   BUN 11 02/25/2020 1627   CREATININE 0.68 06/09/2022 1132      Component Value Date/Time   CALCIUM  10.4 (H) 06/09/2022 1132   ALKPHOS 100 06/09/2022 1132   AST 38 06/09/2022 1132   ALT 29 06/09/2022 1132   BILITOT 0.6 06/09/2022 1132       Impression and Plan: Crystal Lee is a very pleasant 89 yo caucasian female with history of bilateral stage Ia breast cancer diagnosed back in 1998. She has locally advanced colon cancer stage IIIb with 3+ lymph nodes. She completed all of her adjuvant therapy, with Xeloda, in July 2022. CEA pending.  She continues to do well and so far there has been no evidence of malignancy.  Follow-up in 6 months with Zometa that same day.   Lottie Dawson, NP 1/10/20241:07 PM

## 2022-06-10 ENCOUNTER — Telehealth: Payer: Self-pay | Admitting: *Deleted

## 2022-06-10 NOTE — Telephone Encounter (Signed)
As noted below by Dr. Marin Olp, I informed the patient that the tumor marker for the colon cancer is normal. She verbalized understanding.

## 2022-06-10 NOTE — Telephone Encounter (Signed)
-----   Message from [name], MD sent at 06/10/2022  5:54 AM EST ----- Call - the tumor marker for the colon cancer is normal!!!  Laurey Arrow

## 2022-06-15 DIAGNOSIS — H16203 Unspecified keratoconjunctivitis, bilateral: Secondary | ICD-10-CM | POA: Diagnosis not present

## 2022-06-17 ENCOUNTER — Other Ambulatory Visit (HOSPITAL_COMMUNITY): Payer: Self-pay

## 2022-06-17 ENCOUNTER — Other Ambulatory Visit: Payer: Self-pay

## 2022-06-17 DIAGNOSIS — H9222 Otorrhagia, left ear: Secondary | ICD-10-CM | POA: Diagnosis not present

## 2022-06-17 DIAGNOSIS — J300 Vasomotor rhinitis: Secondary | ICD-10-CM | POA: Diagnosis not present

## 2022-06-17 MED ORDER — IPRATROPIUM BROMIDE 0.06 % NA SOLN
2.0000 | Freq: Three times a day (TID) | NASAL | 5 refills | Status: AC
  Filled 2022-06-17: qty 15, 25d supply, fill #0

## 2022-06-22 ENCOUNTER — Other Ambulatory Visit (HOSPITAL_COMMUNITY): Payer: Self-pay

## 2022-07-05 ENCOUNTER — Other Ambulatory Visit: Payer: Self-pay

## 2022-07-05 ENCOUNTER — Other Ambulatory Visit (HOSPITAL_COMMUNITY): Payer: Self-pay

## 2022-07-05 ENCOUNTER — Encounter: Payer: Self-pay | Admitting: Registered Nurse

## 2022-07-05 ENCOUNTER — Encounter: Payer: Medicare Other | Attending: Physical Medicine & Rehabilitation | Admitting: Registered Nurse

## 2022-07-05 VITALS — BP 120/71 | HR 82 | Ht 66.0 in | Wt 214.0 lb

## 2022-07-05 DIAGNOSIS — G894 Chronic pain syndrome: Secondary | ICD-10-CM | POA: Diagnosis not present

## 2022-07-05 DIAGNOSIS — M546 Pain in thoracic spine: Secondary | ICD-10-CM | POA: Insufficient documentation

## 2022-07-05 DIAGNOSIS — M2550 Pain in unspecified joint: Secondary | ICD-10-CM | POA: Insufficient documentation

## 2022-07-05 DIAGNOSIS — Z79891 Long term (current) use of opiate analgesic: Secondary | ICD-10-CM | POA: Diagnosis not present

## 2022-07-05 DIAGNOSIS — G8929 Other chronic pain: Secondary | ICD-10-CM | POA: Insufficient documentation

## 2022-07-05 DIAGNOSIS — M7061 Trochanteric bursitis, right hip: Secondary | ICD-10-CM | POA: Diagnosis not present

## 2022-07-05 DIAGNOSIS — Z5181 Encounter for therapeutic drug level monitoring: Secondary | ICD-10-CM | POA: Diagnosis not present

## 2022-07-05 DIAGNOSIS — R202 Paresthesia of skin: Secondary | ICD-10-CM | POA: Diagnosis not present

## 2022-07-05 DIAGNOSIS — M170 Bilateral primary osteoarthritis of knee: Secondary | ICD-10-CM | POA: Diagnosis not present

## 2022-07-05 DIAGNOSIS — M7062 Trochanteric bursitis, left hip: Secondary | ICD-10-CM | POA: Diagnosis not present

## 2022-07-05 DIAGNOSIS — M961 Postlaminectomy syndrome, not elsewhere classified: Secondary | ICD-10-CM | POA: Diagnosis not present

## 2022-07-05 DIAGNOSIS — S32000S Wedge compression fracture of unspecified lumbar vertebra, sequela: Secondary | ICD-10-CM | POA: Diagnosis not present

## 2022-07-05 MED ORDER — HYDROCODONE-ACETAMINOPHEN 7.5-325 MG PO TABS
1.0000 | ORAL_TABLET | Freq: Every day | ORAL | 0 refills | Status: DC | PRN
  Filled 2022-07-05: qty 150, 30d supply, fill #0

## 2022-07-05 MED ORDER — BACITRACIN 500 UNIT/GM OP OINT
TOPICAL_OINTMENT | OPHTHALMIC | 99 refills | Status: DC
  Filled 2022-07-05: qty 3.5, 20d supply, fill #0

## 2022-07-05 NOTE — Progress Notes (Signed)
Subjective:    Patient ID: Crystal Lee, female    DOB: Dec 01, 1933, 89 y.o.   MRN: [id number]  HPI: Crystal Lee is a 89 y.o. female who returns for follow up appointment for chronic pain and medication refill. She states her pain is located in her mid- lower back, bilateral hips and bilateral knee pain. She also reports generalized joint pain. She rates her pain 3. Her current exercise regime is walking and performing stretching exercises.  Ms. Eckles  Morphine equivalent is 40.00 MME.  Oral swab was performed today.    Pain Inventory Average Pain 3 Pain Right Now 3 My pain is intermittent, sharp, burning, dull, stabbing, tingling, and aching  In the last 24 hours, has pain interfered with the following? General activity 3 Relation with others 6 Enjoyment of life 6 What TIME of day is your pain at its worst? night Sleep (in general) Poor  Pain is worse with: walking, bending, sitting, standing, and some activites Pain improves with: rest and medication Relief from Meds: 7  Family History  Problem Relation Age of Onset   Other Mother        complications from flu   Other Father        unsure of cause   Colon cancer Neg Hx    Social History   Socioeconomic History   Marital status: Widowed    Spouse name: Not on file   Number of children: 3   Years of education: 16 years   Highest education level: Not on file  Occupational History   Occupation: Retired  Tobacco Use   Smoking status: Former    Packs/day: 0.50    Years: 20.00    Total pack years: 10.00    Types: Cigarettes    Quit date: 02/03/1976    Years since quitting: 46.4   Smokeless tobacco: Never  Vaping Use   Vaping Use: Never used  Substance and Sexual Activity   Alcohol use: No   Drug use: No   Sexual activity: Not Currently    Birth control/protection: Post-menopausal  Other Topics Concern   Not on file  Social History Narrative   Lives at home with husband.   Right-handed.   No caffeine use.    Social Determinants of Health   Financial Resource Strain: Low Risk  (08/07/2021)   Overall Financial Resource Strain (CARDIA)    Difficulty of Paying Living Expenses: Not hard at all  Food Insecurity: No Food Insecurity (08/07/2021)   Hunger Vital Sign    Worried About Running Out of Food in the Last Year: Never true    Ran Out of Food in the Last Year: Never true  Transportation Needs: No Transportation Needs (08/07/2021)   PRAPARE - Hydrologist (Medical): No    Lack of Transportation (Non-Medical): No  Physical Activity: Not on file  Stress: Not on file  Social Connections: Moderately Isolated (08/07/2021)   Social Connection and Isolation Panel [NHANES]    Frequency of Communication with Friends and Family: More than three times a week    Frequency of Social Gatherings with Friends and Family: More than three times a week    Attends Religious Services: More than 4 times per year    Active Member of [employer] or Organizations: No    Attends Archivist Meetings: Never    Marital Status: Widowed   Past Surgical History:  Procedure Laterality Date   60 HOUR [place] STUDY N/A 06/21/2018  Procedure: [place] STUDY;  Surgeon: Lavena Bullion, DO;  [facility]: [facility];  Service: Gastroenterology;  Laterality: N/A;  with impedance   ABDOMINAL HYSTERECTOMY  2010   ANAL RECTAL MANOMETRY N/A 09/19/2019   Procedure: ANO RECTAL MANOMETRY;  Surgeon: Mauri Pole, MD;  [facility]: [facility];  Service: Endoscopy;  Laterality: N/A;   [place]   BIOPSY  06/10/2020   Procedure: BIOPSY;  Surgeon: Lavena Bullion, DO;  [facility];  Service: Gastroenterology;;  EGD and COLON   BREAST SURGERY     CATARACT EXTRACTION Bilateral 2011   CHOLECYSTECTOMY     COLON RESECTION N/A 06/12/2020   Procedure: LAPAROSCOPIC COLON RESECTION;  Surgeon: [name], MD;  [facility];  Service: General;  Laterality:  N/A;   COLONOSCOPY WITH PROPOFOL N/A 06/10/2020   Procedure: COLONOSCOPY WITH PROPOFOL;  Surgeon: Lavena Bullion, DO;  [facility];  Service: Gastroenterology;  Laterality: N/A;   ESOPHAGEAL MANOMETRY N/A 06/21/2018   Procedure: ESOPHAGEAL MANOMETRY (EM);  Surgeon: Lavena Bullion, DO;  [facility];  Service: Gastroenterology;  Laterality: N/A;   ESOPHAGOGASTRODUODENOSCOPY (EGD) WITH PROPOFOL N/A 06/10/2020   Procedure: ESOPHAGOGASTRODUODENOSCOPY (EGD) WITH PROPOFOL;  Surgeon: Lavena Bullion, DO;  [facility];  Service: Gastroenterology;  Laterality: N/A;   GALLBLADDER SURGERY  2015   INCISIONAL HERNIA REPAIR N/A 09/16/2021   Procedure: OPEN INCISIONAL HERNIA REPAIR WITH MESH;  Surgeon: [name], MD;  Location: [place];  Service: General;  Laterality: N/A;   JOINT REPLACEMENT     RT TOTAL HIP / RT [place]    [place] STUDY  06/21/2018   Procedure: [place] IMPEDANCE STUDY;  Surgeon: Lavena Bullion, DO;  [facility];  Service: Gastroenterology;;   POLYPECTOMY  06/10/2020   Procedure: POLYPECTOMY;  Surgeon: Lavena Bullion, DO;  [facility];  Service: Gastroenterology;;   SKIN CANCER EXCISION  2016   RT SIDE OF NOSE   SUBMUCOSAL TATTOO INJECTION  06/10/2020   Procedure: SUBMUCOSAL TATTOO INJECTION;  Surgeon: Lavena Bullion, DO;  [facility];  Service: Gastroenterology;;   [place]  2010   RIGHT   TOTAL HIP ARTHROPLASTY Left 05/09/2015   Procedure: LEFT TOTAL HIP ARTHROPLASTY ANTERIOR APPROACH;  Surgeon: Gaynelle Arabian, MD;  [facility];  Service: Orthopedics;  Laterality: Left;   TOTAL KNEE ARTHROPLASTY  2001   TUMOR REMOVAL  2012   ABDOMINAL - NON CANCEROUS   Past Surgical History:  Procedure Laterality Date   58 HOUR [place] STUDY N/A 06/21/2018   Procedure: 24 HOUR [place] STUDY;  Surgeon: Lavena Bullion, DO;  [facility];  Service: Gastroenterology;  Laterality: N/A;  with impedance   ABDOMINAL HYSTERECTOMY  2010   ANAL RECTAL MANOMETRY N/A 09/19/2019   Procedure: ANO RECTAL MANOMETRY;  Surgeon: Mauri Pole, MD;  [facility];  Service: Endoscopy;  Laterality: N/A;   [place]   BIOPSY  06/10/2020   Procedure: BIOPSY;  Surgeon: Lavena Bullion, DO;  [facility];  Service: Gastroenterology;;  EGD and COLON   BREAST SURGERY     CATARACT EXTRACTION Bilateral 2011   CHOLECYSTECTOMY     COLON RESECTION N/A 06/12/2020   Procedure: LAPAROSCOPIC COLON RESECTION;  Surgeon: [name], MD;  [facility];  Service: General;  Laterality: N/A;   COLONOSCOPY WITH PROPOFOL N/A 06/10/2020   Procedure: COLONOSCOPY WITH PROPOFOL;  Surgeon: Lavena Bullion, DO;  [facility];  Service: Gastroenterology;  Laterality: N/A;   ESOPHAGEAL MANOMETRY N/A 06/21/2018   Procedure: ESOPHAGEAL MANOMETRY (EM);  Surgeon: Lavena Bullion, DO;  [facility];  Service: Gastroenterology;  Laterality: N/A;   ESOPHAGOGASTRODUODENOSCOPY (EGD) WITH PROPOFOL N/A 06/10/2020   Procedure: ESOPHAGOGASTRODUODENOSCOPY (EGD) WITH PROPOFOL;  Surgeon: Lavena Bullion, DO;  [facility];  Service: Gastroenterology;  Laterality: N/A;   GALLBLADDER SURGERY  2015   INCISIONAL HERNIA REPAIR N/A 09/16/2021   Procedure: OPEN INCISIONAL HERNIA REPAIR WITH MESH;  Surgeon: [name], MD;  Location: [place];  Service: General;  Laterality: N/A;   JOINT REPLACEMENT     RT TOTAL HIP / RT [place]    [place] STUDY  06/21/2018   Procedure: [place] IMPEDANCE STUDY;  Surgeon: Lavena Bullion, DO;  [facility];  Service: Gastroenterology;;   POLYPECTOMY  06/10/2020   Procedure: POLYPECTOMY;  Surgeon: Lavena Bullion, DO;  [facility];  Service: Gastroenterology;;   SKIN CANCER EXCISION  2016    RT SIDE OF NOSE   SUBMUCOSAL TATTOO INJECTION  06/10/2020   Procedure: SUBMUCOSAL TATTOO INJECTION;  Surgeon: Lavena Bullion, DO;  [facility];  Service: Gastroenterology;;   [place]  2010   RIGHT   TOTAL HIP ARTHROPLASTY Left 05/09/2015   Procedure: LEFT TOTAL HIP ARTHROPLASTY ANTERIOR APPROACH;  Surgeon: Gaynelle Arabian, MD;  [facility];  Service: Orthopedics;  Laterality: Left;   TOTAL KNEE ARTHROPLASTY  2001   TUMOR REMOVAL  2012   ABDOMINAL - NON CANCEROUS   Past Medical History:  Diagnosis Date   Arthritis    Cancer ([place])    HX BREAST CANCER/ SKIN CANCER   Colon cancer ([place])    Complication of anesthesia    N/V WITH MORPHINE   Difficulty sleeping    Fractured hip ([place])    LEFT - AUG 2016   GERD (gastroesophageal reflux disease)    Hyperlipidemia    Hypertension    Melanoma ([place])    Neuropathy    Nocturia    Osteopenia    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  Opioid Risk Score:   Fall Risk Score:  `1  Depression screen [facility] 2/9     05/25/2022   11:39 AM 04/05/2022    1:03 PM 01/12/2022    1:42 PM 10/30/2021    1:08 PM 09/29/2021   12:54 PM 09/01/2021    1:29 PM 08/07/2021    3:30 PM  Depression screen PHQ 2/9  Decreased Interest 0 0 0 0 0 0 0  Down, Depressed, Hopeless 0 0 0 0 0 0 0  PHQ - 2 Score 0 0 0 0 0 0 0    Review of Systems  Musculoskeletal:  Positive for arthralgias, back pain and gait problem.       Pain all over the body  All other systems reviewed and are negative.      Objective:   Physical Exam Vitals and nursing note reviewed.  Constitutional:  Appearance: Normal appearance.  Cardiovascular:     Rate and Rhythm: Normal rate and regular rhythm.     Pulses: Normal  pulses.     Heart sounds: Normal heart sounds.  Pulmonary:     Effort: Pulmonary effort is normal.     Breath sounds: Normal breath sounds.  Musculoskeletal:     Cervical back: Normal range of motion and neck supple.     Comments: Normal Muscle Bulk and Muscle Testing Reveals:  Upper Extremities: Full ROM and Muscle Strength 5/5  Thoracic Paraspinal Tenderness: T-7-T-9  Lumbar Paraspinal Tenderness L-3-L-5 Lower Extremities: Full ROM and Muscle Strength 5/5 Arises from Chair slowly using walker for support Narrow Based  Gait     Skin:    General: Skin is warm and dry.  Neurological:     Mental Status: She is alert and oriented to person, place, and time.  Psychiatric:        Mood and Affect: Mood normal.        Behavior: Behavior normal.         Assessment & Plan:  1. Chronic Bilateral Leg pain: Paresthesia: Continue to Monitor. 07/05/2022 2. Paresthesia Doreene Burke Radiculitis: Ms. Hermans has weaned herself off the Lyrica due to daytime drowsiness. We will continue to monitor. 07/05/2022 3. Pain of Left Wrist/ : No complaints today. S/P Carpal Tunnel Release on 06/03/2017 by Dr. Ellene Route. Dr. Ellene Route Following. 07/05/2022. 4. Fracture of superior pubic ramus.  Dr. Wynelle Link Following.  Continue to monitor. 07/05/2022. 5. Bilateral Knee OA: Continue Voltaren Gel.  Continue to monitor. Orthopedist following. 07/05/2022 6. Polyarthralgia: Continue to alternate with heat and ice therapy. Continue current medication regime. Continue to monitor. 07/05/2022. 7. Chronic Pain Syndrome: Rv::Hydrocodone  7.5/325 one tablet 5 times a day as needed for pain #150. Hydrocodone 10/325 mg on back order.  8. Lumbar Compression Fracture L2 and L3:  Ms. Fowers refused physical therapy. Continue with rest/ heat therapy. Continue to Monitor 07/05/2022. 9. Muscle Spasm: Continue  Flexeril 5 mg at HS. Continue to monitor. 07/05/2022 10. Right Shoulder Tendonitis: No complaints today. Continue HEP as Tolerated.  Alternate Ice and Heat Therapy. 07/05/2022  11. Greater Trochanter Bursitis: Continue to Monitor. Continue HEP as Tolerated. 07/05/2022   F/U in 1 Month

## 2022-07-05 NOTE — Patient Instructions (Signed)
Call Office on 07/28/2022, to see about Hydrocodone prescription.   [phone]

## 2022-07-07 LAB — DRUG TOX MONITOR 1 W/CONF, ORAL FLD
Amphetamines: NEGATIVE ng/mL (ref <10)
Barbiturates: NEGATIVE ng/mL (ref <10)
Benzodiazepines: NEGATIVE ng/mL (ref <0.50)
Buprenorphine: NEGATIVE ng/mL (ref <0.10)
Cocaine: NEGATIVE ng/mL (ref <5.0)
Codeine: NEGATIVE ng/mL (ref <2.5)
Dihydrocodeine: 6.8 ng/mL — ABNORMAL HIGH (ref <2.5)
Fentanyl: NEGATIVE ng/mL (ref <0.10)
Heroin Metabolite: NEGATIVE ng/mL (ref <1.0)
Hydrocodone: 45.4 ng/mL — ABNORMAL HIGH (ref <2.5)
Hydromorphone: NEGATIVE ng/mL (ref <2.5)
MARIJUANA: NEGATIVE ng/mL (ref <2.5)
MDMA: NEGATIVE ng/mL (ref <10)
Meprobamate: NEGATIVE ng/mL (ref <2.5)
Methadone: NEGATIVE ng/mL (ref <5.0)
Morphine: NEGATIVE ng/mL (ref <2.5)
Nicotine Metabolite: NEGATIVE ng/mL (ref <5.0)
Norhydrocodone: 3.4 ng/mL — ABNORMAL HIGH (ref <2.5)
Noroxycodone: NEGATIVE ng/mL (ref <2.5)
Opiates: POSITIVE ng/mL — AB (ref <2.5)
Oxycodone: NEGATIVE ng/mL (ref <2.5)
Oxymorphone: NEGATIVE ng/mL (ref <2.5)
Phencyclidine: NEGATIVE ng/mL (ref <10)
Tapentadol: NEGATIVE ng/mL (ref <5.0)
Tramadol: NEGATIVE ng/mL (ref <5.0)
Zolpidem: NEGATIVE ng/mL (ref <5.0)

## 2022-07-07 LAB — DRUG TOX ALC METAB W/CON, ORAL FLD: Alcohol Metabolite: NEGATIVE ng/mL (ref <25)

## 2022-07-11 NOTE — Progress Notes (Signed)
Assessment/Plan:   Memory Impairment Crystal Lee is a very pleasant 89 y.o. 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 She is seen today in follow up for memory loss. Patient is currently is not on antidementia meds as she had undesirable side effects (diarrhea) and not interested in any other agent unless memory worsened.  Marland Kitchen MRI brain personally reviewed was remarkable for was remarkable for either prior microhemorrhages from amyloid angiopathy due to possible Alzheimer's disease or prior microhemorrhages       Follow up in   months. Recommend good control of cardiovascular risk factors.   Continue to control mood as per PCP Follow up in 6 months     Subjective:    This patient is accompanied in the office by *** who supplements the history.  Previous records as well as any outside records available were reviewed prior to todays visit. Patient was last seen on 01/07/22, with a MoCA of 18/30.  ***   Any changes in memory since last visit? repeats oneself?  Endorsed Disoriented when walking into a room?  Patient denies except occasionally not remembering what patient came to the room for ***  Leaving objects in unusual places?    denies   Wandering behavior?  denies   Any personality changes since last visit?  denies   Any worsening depression?:  denies   Hallucinations or paranoia?  denies   Seizures?    denies    Any sleep changes?  Denies vivid dreams, REM behavior or sleepwalking   Sleep apnea?   denies   Any hygiene concerns?    denies   Independent of bathing and dressing?  Endorsed  Does the patient needs help with medications? is in charge *** Who is in charge of the finances?   is in charge   *** Any changes in appetite?  denies  ***   Patient have trouble swallowing?  denies   Does the patient cook?  Any kitchen accidents such as leaving the stove on? Patient denies   Any headaches?   denies   Chronic back pain  denies   Ambulates with difficulty? She has chronic B LE paresthesia and L radiculitis,L2-3 compression fx, weaning herself off Lyrica due to daytime drowsiness, as well as mutiple areas of arthritis  and spasms with chronic pain syndrome on multiple agents . this is followed by PCP . Declines PT . She is on rest and heat therapy  Recent falls or head injuries? denies     Unilateral weakness, numbness or tingling?    denies   Any tremors?  denies   Any anosmia?  Patient denies   Any incontinence of urine?  denies   Any bowel dysfunction?     denies      Patient lives  *** Does the patient drive?***  Initial visit 05/05/2021 the patient is seen in neurologic consultation at the request of Copland, Gay Filler, MD for the evaluation of memory.  The patient is here alone.  This is a 89 y.o. year old RH  female who presents today after hospitalization for strokelike symptoms.  It was also noted that she may have had cognitive issues during the presentation, at which time a MoCA was performed, yielding a result of 18/30.  Of note, the patient  denies having any cognitive issues, treated in some of short-term memory difficulties to "old age ".  During the hospitalization, she was diagnosed with small vessel disease, evidence of amyloid angiopathy per MRI, central nervous system degenerative disorder, and mild to moderate Alzheimer's disease per review of the notes.  She was to follow-up at [facility] but she wanted to seek additional neurologic opinion with [facility] neurology instead. For her memory, she denies repeating herself, or being disoriented when walking into the room.  She denies leaving objects in unusual places.  She ambulates with her walker for safety.  She had prior falls, fracturing her pelvis, and "living with pain  over the last 89 years old older ".  She only had 1 minor head injury about 17 years ago while cleaning a window, and hitting her head while she was moving forward.  She did not lose any consciousness at that time.  She denies being disoriented when walking, or wandering behavior.  She drives any distances, "without GPS I do not need it ".  She stopped driving long distances however when she came from [place] in 2018 to retire, and her vision was not as good.  Since her husband's death, she lives alone, and has home health visiting her.  Her mood is good, she denies depression or irritability.  She enjoys painting, and coloring.  She is to be able to do crossword puzzles but she stopped due to "eye problems ".  She sleeps well, denies vivid dreams or sleepwalking.  She denies hallucinations or paranoia.  There are no hygiene concerns.  She is independent of bathing and dressing.  Her medications are in a pillbox.  Her finances are done by her, only taxes are done by one of her children.  Her appetite is good, denies any trouble swallowing.  She cooks without forgetting any common recipes, or burning the stove.     As for her strokelike symptoms, the patient reports that she had similar episodes in the year 2000, when she noticed mild facial drooping and numbness, which quickly resolved, without recurrence.  On the day of evaluation in October 2022, she was at her doctor's office, when she had 20 second  episode (she denies this was 20-minute episodes as in chart).  Her PCP told her to go "downstairs today urgent care, to check that out ".  From the urgent care, the transported her to the ER, for further evaluation, suspecting that the patient was having a TIA.  Per chart notes, she may have had possible right facial droop and right-sided weakness, although she states that for the last 20 years, she has slightly decreased strength on the right side of her body.  The ER neurologist saw her, and work-up was initiated.   MRI of the brain did not show acute CVA, but did show amyloid angiopathy.  EEG was without seizures.  2D echo was unremarkable.  Neurology recommended aspirin 81 mg on discharge, and no DAPT was going to be given, because of concern of amyloid angiopathy.  Lipitor 20 was added on discharge.  No further episodes of strokelike symptoms since then.  Of note, during that hospitalization, she also was found to have UTI "I know I had it, I have regular UTIs".  EKG shows sinus tachycardia.  COVID was negative.  Neurology recommended that she had a better control of her blood pressure to lays down on [id number] systolic.  No stroke was diagnosed.  She denies today any headaches, double vision, dizziness, focal  numbness or tingling, unilateral weakness other than the chronic mild right upper extremity as mentioned above, tremors or anosmia.  No history of seizures.  She denies urine incontinence, but occasionally wears a pad when going out.  She denies any retention.  She has a long history of constipation.  She denies any diarrhea.  She denies sleep apnea, although in chart it shows in certain areas that she may have it.  She denies any alcohol or tobacco.  Family history negative for dementia.  The patient is originally from [place].      UA and urine culture showed E. coli, resistant to bacterium, she was treated with Keflex 500 twice a day, was seen by urogynecologist Dr. Wannetta Sender, was put on Bactrim prophylactic treatment for 6 months, she had a history of frequent UTI  Laboratory evaluation showed LDL 91, A1c 5.0, normal CBC, hemoglobin of 13.4, CMP, creatinine of 0.6, she was discharged with aspirin 81 mg daily, and Lipitor 20 mg daily       MRI brain  1. No evidence of acute intracranial abnormality, including infarct. 2. Numerous tiny scattered foci of susceptibility artifact throughout the cortex bilaterally, greatest in the parieto-occipital lobes. While nonspecific, findings could relate to prior  microhemorrhages from amyloid angiopathy or prior microemboli. The distribution is atypical for hypertensive hemorrhages.      EMG/NCS 2019 bilateral lower extremity paresthesia, bilateral leg pain, EMG nerve conduction study then showed no large fiber peripheral neuropathy    PREVIOUS MEDICATIONS: donepezil (GI)   CURRENT MEDICATIONS:  Outpatient Encounter Medications as of 07/12/2022  Medication Sig   aspirin EC 81 MG tablet Take 81 mg by mouth daily. Swallow whole.   atorvastatin (LIPITOR) 20 MG tablet Take 1 tablet (20 mg total) by mouth daily.   bacitracin ophthalmic ointment Apply to eyelids at bedtime as needed.   Cholecalciferol (VITAMIN D3) 1.25 MG (50000 UT) CAPS Take 1 weekly   cyclobenzaprine (FLEXERIL) 5 MG tablet Take 1 tablet (5 mg total) by mouth at bedtime as needed for muscle spasms.   Ergocalciferol (VITAMIN D2 PO)    estradiol (ESTRACE) 0.1 MG/GM vaginal cream Place 0.5 g vaginally 2 (two) times a week as directed (Patient not taking: Reported on 07/05/2022)   guaiFENesin-codeine (ROBITUSSIN AC) 100-10 MG/5ML syrup Oral for 6 Days (Patient not taking: Reported on 07/05/2022)   hydrochlorothiazide (HYDRODIURIL) 12.5 MG tablet Take 1 tablet (12.5 mg total) by mouth daily. Use once daily as needed for swelling in hands   HYDROcodone-acetaminophen (NORCO) 7.5-325 MG tablet Take 1 tablet by mouth 5 (five) times daily as needed for moderate pain.   ipratropium (ATROVENT) 0.06 % nasal spray Place 2 sprays into both nostrils 3 (three) times daily.   losartan (COZAAR) 25 MG tablet Take 1 tablet (25 mg total) by mouth daily.   methenamine (HIPREX) 1 g tablet Take 1 tablet (1 g total) by mouth 2 (two) times daily with a meal.   mirabegron ER (MYRBETRIQ) 50 MG TB24 tablet Take 1 tablet (50 mg total) by mouth daily.   moxifloxacin (VIGAMOX) 0.5 % ophthalmic solution Place 1 drop into both eyes 3 (three) times daily until Dr. says to stop (Patient not taking: Reported on 06/09/2022)    Multiple Vitamins-Minerals (MULTIVITAMIN ADULTS) TABS Take 1 tablet by mouth daily.   neomycin-polymyxin b-dexamethasone (MAXITROL) 3.5-10000-0.1 OINT APPLY 1 INCH STRIP TO BOTH AFFECTED LIDS TWICE DAILY FOR 1 WEEK, THEN DAILY FOR 2ND WEEK (Patient not taking: Reported on 07/05/2022)   phenazopyridine (PYRIDIUM) 200 MG  tablet Take 1 tablet (200 mg total) by mouth 3 (three) times daily as needed for pain. (Patient not taking: Reported on 07/05/2022)   potassium chloride (KLOR-CON) 10 MEQ tablet Take 1 tablet by mouth daily. (Patient not taking: Reported on 07/05/2022)   valACYclovir (VALTREX) 1000 MG tablet Take 1 tablet (1,000 mg total) by mouth 2 (two) times daily. (Patient not taking: Reported on 06/09/2022)   vitamin B-12 (CYANOCOBALAMIN) 1000 MCG tablet Take 1,000 mcg by mouth daily.   No facility-administered encounter medications on file as of 07/12/2022.        No data to display            05/05/2021   12:00 PM  Montreal Cognitive Assessment   Visuospatial/ Executive (0/5) 2  Naming (0/3) 3  Attention: Read list of digits (0/2) 2  Attention: Read list of letters (0/1) 1  Attention: Serial 7 subtraction starting at 100 (0/3) 1  Language: Repeat phrase (0/2) 1  Language : Fluency (0/1) 0  Abstraction (0/2) 2  Delayed Recall (0/5) 0  Orientation (0/6) 6  Total 18    Objective:     PHYSICAL EXAMINATION:    VITALS:  There were no vitals filed for this visit.  GEN:  The patient appears stated age and is in NAD. HEENT:  Normocephalic, atraumatic.   Neurological examination:  General: NAD, well-groomed, appears stated age. Orientation: The patient is alert. Oriented to person, place and date Cranial nerves: There is good facial symmetry.The speech is fluent and clear. No aphasia or dysarthria. Fund of knowledge is appropriate. Recent and remote memory are impaired. Attention and concentration are reduced.  Able to name objects and repeat phrases.  Hearing is intact to  conversational tone.    Sensation: Sensation is intact to light touch throughout Motor: Strength is at least antigravity x4. DTR's 2/4 in UE/LE     Movement examination: Tone: There is normal tone in the UE/LE Abnormal movements:  no tremor.  No myoclonus.  No asterixis.   Coordination:  There is no decremation with RAM's. Normal finger to nose  Gait and Station: The patient has no difficulty arising out of a deep-seated chair without the use of the hands. The patient's stride length is good.  Gait is cautious and narrow.    Thank you for allowing [place] the opportunity to participate in the care of this nice patient. Please do not hesitate to contact us for any questions or concerns.   Total time spent on today's visit was *** minutes dedicated to this patient today, preparing to see patient, examining the patient, ordering tests and/or medications and counseling the patient, documenting clinical information in the EHR or other health record, independently interpreting results and communicating results to the patient/family, discussing treatment and goals, answering patient's questions and coordinating care.  Cc:  Copland, Gay Filler, MD  Sharene Butters 07/11/2022 1:11 PM

## 2022-07-12 ENCOUNTER — Encounter: Payer: Self-pay | Admitting: Physician Assistant

## 2022-07-12 ENCOUNTER — Ambulatory Visit (INDEPENDENT_AMBULATORY_CARE_PROVIDER_SITE_OTHER): Payer: Medicare Other | Admitting: Physician Assistant

## 2022-07-12 VITALS — Resp 20 | Ht 66.0 in

## 2022-07-12 DIAGNOSIS — R413 Other amnesia: Secondary | ICD-10-CM

## 2022-07-12 DIAGNOSIS — F03A0 Unspecified dementia, mild, without behavioral disturbance, psychotic disturbance, mood disturbance, and anxiety: Secondary | ICD-10-CM | POA: Diagnosis not present

## 2022-07-12 HISTORY — DX: Other amnesia: R41.3

## 2022-07-12 MED ORDER — MEMANTINE HCL 5 MG PO TABS: 5.0000 mg | ORAL_TABLET | Freq: Every day | ORAL | 11 refills | Status: DC

## 2022-07-12 NOTE — Patient Instructions (Addendum)
It was a pleasure to see you today at our office.   Recommendations:  Follow up  August 26 at 11:30  Strong control of cardiovascular risk factors Continue baby aspirin and lipitor, follow up with primary doctor  Start memantine 5 mg at night.      RECOMMENDATIONS FOR ALL PATIENTS WITH MEMORY PROBLEMS: 1. Continue to exercise (Recommend 30 minutes of walking everyday, or 3 hours every week) 2. Increase social interactions - continue going to [place] and enjoy social gatherings with friends and family 3. Eat healthy, avoid fried foods and eat more fruits and vegetables 4. Maintain adequate blood pressure, blood sugar, and blood cholesterol level. Reducing the risk of stroke and cardiovascular disease also helps promoting better memory. 5. Avoid stressful situations. Live a simple life and avoid aggravations. Organize your time and prepare for the next day in anticipation. 6. Sleep well, avoid any interruptions of sleep and avoid any distractions in the bedroom that may interfere with adequate sleep quality 7. Avoid sugar, avoid sweets as there is a strong link between excessive sugar intake, diabetes, and cognitive impairment We discussed the Mediterranean diet, which has been shown to help patients reduce the risk of progressive memory disorders and reduces cardiovascular risk. This includes eating fish, eat fruits and green leafy vegetables, nuts like almonds and hazelnuts, walnuts, and also use olive oil. Avoid fast foods and fried foods as much as possible. Avoid sweets and sugar as sugar use has been linked to worsening of memory function.  There is always a concern of gradual progression of memory problems. If this is the case, then we may need to adjust level of care according to patient needs. Support, both to the patient and caregiver, should then be put into place.    The Alzheimer's Association is here all day, every day for people facing Alzheimer's disease through our free 24/7  Helpline: [phone]. The Helpline provides reliable information and support to all those who need assistance, such as individuals living with memory loss, Alzheimer's or other dementia, caregivers, health care professionals and the public.  Our highly trained and knowledgeable staff can help you with: Understanding memory loss, dementia and Alzheimer's  Medications and other treatment options  General information about aging and brain health  Skills to provide quality care and to find the best care from professionals  Legal, financial and living-arrangement decisions Our Helpline also features: Confidential care consultation provided by master's level clinicians who can help with decision-making support, crisis assistance and education on issues families face every day  Help in a caller's preferred language using our translation service that features more than 200 languages and dialects  Referrals to local community programs, services and ongoing support     FALL PRECAUTIONS: Be cautious when walking. Scan the area for obstacles that may increase the risk of trips and falls. When getting up in the mornings, sit up at the edge of the bed for a few minutes before getting out of bed. Consider elevating the bed at the head end to avoid drop of blood pressure when getting up. Walk always in a well-lit room (use night lights in the walls). Avoid area rugs or power cords from appliances in the middle of the walkways. Use a walker or a cane if necessary and consider physical therapy for balance exercise. Get your eyesight checked regularly.  FINANCIAL OVERSIGHT: Supervision, especially oversight when making financial decisions or transactions is also recommended.  HOME SAFETY: Consider the safety of the kitchen when operating  appliances like stoves, microwave oven, and blender. Consider having supervision and share cooking responsibilities until no longer able to participate in those. Accidents with  firearms and other hazards in the house should be identified and addressed as well.   ABILITY TO BE LEFT ALONE: If patient is unable to contact 911 operator, consider using LifeLine, or when the need is there, arrange for someone to stay with patients. Smoking is a fire hazard, consider supervision or cessation. Risk of wandering should be assessed by caregiver and if detected at any point, supervision and safe proof recommendations should be instituted.  MEDICATION SUPERVISION: Inability to self-administer medication needs to be constantly addressed. Implement a mechanism to ensure safe administration of the medications.   DRIVING: Regarding driving, in patients with progressive memory problems, driving will be impaired. We advise to have someone else do the driving if trouble finding directions or if minor accidents are reported. Independent driving assessment is available to determine safety of driving.   If you are interested in the driving assessment, you can contact the following:  [employer] in [place]  [place] [phone] or [phone]      [place] refers to food and lifestyle choices that are based on the traditions of countries located on the [employer]. This way of eating has been shown to help prevent certain conditions and improve outcomes for people who have chronic diseases, like kidney disease and heart disease. What are tips for following this plan? Lifestyle  Cook and eat meals together with your family, when possible. Drink enough fluid to keep your urine clear or pale yellow. Be physically active every day. This includes: Aerobic exercise like running or swimming. Leisure activities like gardening, walking, or housework. Get 7-8 hours of sleep each night. If recommended by your health care provider,  drink red wine in moderation. This means 1 glass a day for nonpregnant women and 2 glasses a day for men. A glass of wine equals 5 oz (150 mL). Reading food labels  Check the serving size of packaged foods. For foods such as rice and pasta, the serving size refers to the amount of cooked product, not dry. Check the total fat in packaged foods. Avoid foods that have saturated fat or trans fats. Check the ingredients list for added sugars, such as corn syrup. Shopping  At the grocery store, buy most of your food from the areas near the walls of the store. This includes: Fresh fruits and vegetables (produce). Grains, beans, nuts, and seeds. Some of these may be available in unpackaged forms or large amounts (in bulk). Fresh seafood. Poultry and eggs. Low-fat dairy products. Buy whole ingredients instead of prepackaged foods. Buy fresh fruits and vegetables in-season from local farmers markets. Buy frozen fruits and vegetables in resealable bags. If you do not have access to quality fresh seafood, buy precooked frozen shrimp or canned fish, such as tuna, salmon, or sardines. Buy small amounts of raw or cooked vegetables, salads, or olives from the deli or salad bar at your store. Stock your pantry so you always have certain foods on hand, such as olive oil, canned tuna, canned tomatoes, rice, pasta, and beans. Cooking  Cook foods with extra-virgin olive oil instead of using butter or other vegetable oils. Have meat as a side dish, and have vegetables or grains as your main dish. This means having meat in small portions or adding small  amounts of meat to foods like pasta or stew. Use beans or vegetables instead of meat in common dishes like chili or lasagna. Experiment with different cooking methods. Try roasting or broiling vegetables instead of steaming or sauteing them. Add frozen vegetables to soups, stews, pasta, or rice. Add nuts or seeds for added healthy fat at each meal. You can add  these to yogurt, salads, or vegetable dishes. Marinate fish or vegetables using olive oil, lemon juice, garlic, and fresh herbs. Meal planning  Plan to eat 1 vegetarian meal one day each week. Try to work up to 2 vegetarian meals, if possible. Eat seafood 2 or more times a week. Have healthy snacks readily available, such as: Vegetable sticks with hummus. Greek yogurt. Fruit and nut trail mix. Eat balanced meals throughout the week. This includes: Fruit: 2-3 servings a day Vegetables: 4-5 servings a day Low-fat dairy: 2 servings a day Fish, poultry, or lean meat: 1 serving a day Beans and legumes: 2 or more servings a week Nuts and seeds: 1-2 servings a day Whole grains: 6-8 servings a day Extra-virgin olive oil: 3-4 servings a day Limit red meat and sweets to only a few servings a month What are my food choices? Mediterranean diet Recommended Grains: Whole-grain pasta. Brown rice. Bulgar wheat. Polenta. Couscous. Whole-wheat bread. [place]. Vegetables: Artichokes. Beets. Broccoli. Cabbage. Carrots. Eggplant. Green beans. Chard. Kale. Spinach. Onions. Leeks. Peas. Squash. Tomatoes. Peppers. Radishes. Fruits: Apples. Apricots. Avocado. Berries. Bananas. Cherries. Dates. Figs. Grapes. Lemons. Melon. Oranges. Peaches. Plums. Pomegranate. Meats and other protein foods: Beans. Almonds. Sunflower seeds. Pine nuts. Peanuts. [place]. Salmon. Scallops. Shrimp. [place]. Tilapia. Clams. Oysters. Eggs. Dairy: Low-fat milk. Cheese. Greek yogurt. Beverages: Water. Red wine. Herbal tea. Fats and oils: Extra virgin olive oil. Avocado oil. Grape seed oil. Sweets and desserts: [place] yogurt with honey. Baked apples. Poached pears. Trail mix. Seasoning and other foods: Basil. Cilantro. Coriander. Cumin. Mint. Parsley. Sage. Rosemary. Tarragon. Garlic. Oregano. Thyme. Pepper. Balsalmic vinegar. Tahini. Hummus. Tomato sauce. Olives. Mushrooms. Limit these Grains: Prepackaged pasta or rice dishes.  Prepackaged cereal with added sugar. Vegetables: Deep fried potatoes (french fries). Fruits: Fruit canned in syrup. Meats and other protein foods: Beef. Pork. Lamb. Poultry with skin. Hot dogs. [place] Salines. Dairy: Ice cream. Sour cream. Whole milk. Beverages: Juice. Sugar-sweetened soft drinks. Beer. Liquor and spirits. Fats and oils: Butter. Canola oil. Vegetable oil. Beef fat (tallow). Lard. Sweets and desserts: Cookies. Cakes. Pies. Candy. Seasoning and other foods: Mayonnaise. Premade sauces and marinades. The items listed may not be a complete list. Talk with your dietitian about what dietary choices are right for you. Summary The Mediterranean diet includes both food and lifestyle choices. Eat a variety of fresh fruits and vegetables, beans, nuts, seeds, and whole grains. Limit the amount of red meat and sweets that you eat. Talk with your health care provider about whether it is safe for you to drink red wine in moderation. This means 1 glass a day for nonpregnant women and 2 glasses a day for men. A glass of wine equals 5 oz (150 mL). This information is not intended to replace advice given to you by your health care provider. Make sure you discuss any questions you have with your health care provider. Document Released: 01/08/2016 Document Revised: 02/10/2016 Document Reviewed: 01/08/2016 Elsevier Interactive Patient Education  2017 [place] provider has requested that you have labwork completed today. Please go to [facility] ([id number]) on the second floor of this building before  leaving the office today. You do not need to check in. If you are not called within 15 minutes please check with the front desk.

## 2022-07-17 NOTE — Progress Notes (Unsigned)
[place] [facility] [place], [place], [place] [address] [phone]  Date:  07/21/2022   Name:  Crystal Lee   DOB:  1934-05-20   MRN:  [id number]  PCP:  Darreld Mclean, MD    Chief Complaint: discuss labs   History of Present Illness:  Crystal Lee is a 89 y.o. very pleasant female patient who presents with the following:  Pt seen today to discuss labs- history of hypertension, hyperlipidemia, sleep apnea, breast cancer and colon cancer presently in remission  Last visit with myself was in September  Pt notes she went for a "lifeline screening" which she wants to go over dated 06/16/22 Her AST was 45- we had checked it actually a week prior and AST was 38 Her CPR was also elevated at 3.5 (last checked in 2018, normal)  We can repeat her CRP today-advised patient that we do not usually get a CRP as a screening test She had a CT abdomen pelvis and right upper quadrant ultrasound in the last 12 months which both showed normal liver; as such, advised that minimally elevated AST is likely of no major concern.  Would recommend rechecking in 6 months  Patient does not drink alcohol  She also notes her eye doctor- Dr Ellie Lunch-   She she neurology earlier this month to discuss mild dementia: Crystal Lee is a very pleasant 89 y.o. RH female  with  a history of hypertension, hyperlipidemia, sleep apnea, iron deficiency anemia, stage IIIb colon cancer recently off Xeloda, on Zometa yearly, history of stage Ia breast cancer, anxiety, depression, and mild dementia likely due to  Alzheimer's disease.  She is seen today in follow up for memory loss. Patient is currently is not on antidementia meds as she had undesirable side effects (diarrhea). We discussed starting memantine at a lower dose, nightly, and she is willing to try, although she admits being concerned of its side effects.   MRI brain personally reviewed was remarkable for was  remarkable for either prior microhemorrhages from amyloid angiopathy . Patient is able to perform all activities of daily living without any issues    Follow up in 6  months. Recommend good control of cardiovascular risk factors.   Continue to control mood as per PCP  Start Memantine 5 mg at night. Side effects discussed   Patient Active Problem List   Diagnosis Date Noted   Memory impairment 07/12/2022   Incisional hernia 09/16/2021   Pain 05/05/2021   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 behavioral disturbance, psychotic disturbance, mood disturbance, or anxiety ([place]) 05/05/2021   Stroke-like symptoms 03/25/2021   Sleep apnea    Rosacea    PONV (postoperative nausea and vomiting)    Nocturia    Neuropathy    Melanoma ([place])    Hypertension    Hyperlipidemia    Fractured hip ([place])    Difficulty sleeping    Complication of anesthesia    Cancer ([place])    Arthritis    Malignant neoplasm of descending colon ([place])    Gastric polyps    Colonic mass    Diverticulosis of colon without hemorrhage    Adenomatous polyp of sigmoid colon    Acute lower GI bleeding 06/09/2020   Lower GI bleed 06/08/2020   Acute blood loss anemia 06/08/2020   HTN (hypertension) 06/08/2020   Constipation    Encounter for orthopedic follow-up care 06/18/2019   Urinary tract infection 04/20/2019   Acute pain of  right wrist 03/28/2019   Tenosynovitis, wrist 03/28/2019   Acquired trigger finger of right little finger 01/15/2019   Carpal tunnel syndrome of right wrist 01/15/2019   Aftercare 08/17/2018   Lumbar post-laminectomy syndrome 07/31/2018   Heartburn    Fracture of superior pubic ramus ([place]) 06/16/2018   Radial styloid tenosynovitis of left hand 05/16/2018   Pain of left hand 04/20/2018   Osteopenia 04/15/2018   Dyslipidemia 02/02/2018   GERD (gastroesophageal reflux disease) 02/02/2018   Trochanteric bursitis of left hip 12/23/2017   Primary osteoarthritis of left knee 10/17/2017   History of right knee  joint replacement 10/17/2017   Pain in both lower extremities 08/02/2017   Stage 1 breast cancer, ER+, right ([place]) 07/04/2017   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   Pain in right hand 06/10/2017   Trigger finger of right hand 06/10/2017   Paresthesia 02/09/2017   Low back pain 02/09/2017   Gait abnormality 02/09/2017   OA (osteoarthritis) of hip 05/09/2015    Past Medical History:  Diagnosis Date   Arthritis    Cancer ([place])    HX BREAST CANCER/ SKIN CANCER   Colon cancer ([place])    Complication of anesthesia    N/V WITH MORPHINE   Difficulty sleeping    Fractured hip ([place])    LEFT - AUG 2016   GERD (gastroesophageal reflux disease)    Hyperlipidemia    Hypertension    Melanoma ([place])    Neuropathy    Nocturia    Osteopenia    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   PONV (postoperative nausea and vomiting)    PT STATES MORPHINE CAUSED N/V   Rosacea    Sleep apnea    Stage 1 breast cancer, ER+, right ([place]) 07/04/2017    Past Surgical History:  Procedure Laterality Date   55 HOUR [place] STUDY N/A 06/21/2018   Procedure: 24 HOUR [place] STUDY;  Surgeon: Lavena Bullion, DO;  Location: [facility];  Service: Gastroenterology;  Laterality: N/A;  with impedance   ABDOMINAL HYSTERECTOMY  2010   ANAL RECTAL MANOMETRY N/A 09/19/2019   Procedure: ANO RECTAL MANOMETRY;  Surgeon: Mauri Pole, MD;  Location: [facility];  Service: Endoscopy;  Laterality: N/A;   [place]   BIOPSY  06/10/2020   Procedure: BIOPSY;  Surgeon: Lavena Bullion, DO;  Location: [facility];  Service: Gastroenterology;;  EGD and COLON   BREAST SURGERY     CATARACT EXTRACTION Bilateral 2011   CHOLECYSTECTOMY     COLON RESECTION N/A 06/12/2020   Procedure: LAPAROSCOPIC COLON RESECTION;  Surgeon: [name], MD;  Location: [facility];  Service: General;  Laterality: N/A;   COLONOSCOPY WITH PROPOFOL N/A 06/10/2020   Procedure: COLONOSCOPY WITH  PROPOFOL;  Surgeon: Lavena Bullion, DO;  Location: [facility];  Service: Gastroenterology;  Laterality: N/A;   ESOPHAGEAL MANOMETRY N/A 06/21/2018   Procedure: ESOPHAGEAL MANOMETRY (EM);  Surgeon: Lavena Bullion, DO;  [facility]: [facility];  Service: Gastroenterology;  Laterality: N/A;   ESOPHAGOGASTRODUODENOSCOPY (EGD) WITH PROPOFOL N/A 06/10/2020   Procedure: ESOPHAGOGASTRODUODENOSCOPY (EGD) WITH PROPOFOL;  Surgeon: Lavena Bullion, DO;  [facility]: [facility];  Service: Gastroenterology;  Laterality: N/A;   GALLBLADDER SURGERY  2015   INCISIONAL HERNIA REPAIR N/A 09/16/2021   Procedure: OPEN INCISIONAL HERNIA REPAIR WITH MESH;  Surgeon: [name], MD;  Location: [facility];  Service: General;  Laterality: N/A;   JOINT REPLACEMENT     RT TOTAL HIP /  RT TOTAL KNEE   MASTECTOMY  1998   BILATERAL    [place] IMPEDANCE STUDY  06/21/2018   Procedure: [place] IMPEDANCE STUDY;  Surgeon: Lavena Bullion, DO;  [facility]: [facility];  Service: Gastroenterology;;   POLYPECTOMY  06/10/2020   Procedure: POLYPECTOMY;  Surgeon: Lavena Bullion, DO;  [facility];  Service: Gastroenterology;;   SKIN CANCER EXCISION  2016   RT SIDE OF NOSE   SUBMUCOSAL TATTOO INJECTION  06/10/2020   Procedure: SUBMUCOSAL TATTOO INJECTION;  Surgeon: Lavena Bullion, DO;  Location[facility];  Service: Gastroenterology;;   [place]  2010   RIGHT   TOTAL HIP ARTHROPLASTY Left 05/09/2015   Procedure: LEFT TOTAL HIP ARTHROPLASTY ANTERIOR APPROACH;  Surgeon: Gaynelle Arabian, MD;  Location[facility];  Service: Orthopedics;  Laterality: Left;   TOTAL KNEE ARTHROPLASTY  2001   TUMOR REMOVAL  2012   ABDOMINAL - NON CANCEROUS    Social History   Tobacco Use   Smoking status: Former    Packs/day: 0.50    Years: 20.00    Total pack years: 10.00    Types: Cigarettes    Quit date: 02/03/1976    Years since quitting: 46.4   Smokeless tobacco: Never  Vaping Use   Vaping  Use: Never used  Substance Use Topics   Alcohol use: No   Drug use: No    Family History  Problem Relation Age of Onset   Other Mother        complications from flu   Other Father        unsure of cause   Colon cancer Neg Hx     Allergies  Allergen Reactions   Morphine Nausea And Vomiting   Propoxyphene Nausea And Vomiting, Palpitations and Other (See Comments)    Darvocet Causes Sweats   Donepezil Diarrhea and Other (See Comments)   Morphine And Related Nausea And Vomiting   Tape Rash and Other (See Comments)    Medication list has been reviewed and updated.  Current Outpatient Medications on File Prior to Visit  Medication Sig Dispense Refill   aspirin EC 81 MG tablet Take 81 mg by mouth daily. Swallow whole.     atorvastatin (LIPITOR) 20 MG tablet Take 1 tablet (20 mg total) by mouth daily. 90 tablet 3   bacitracin ophthalmic ointment Apply to eyelids at bedtime as needed. 3.5 g 99   Cholecalciferol (VITAMIN D3) 1.25 MG (50000 UT) CAPS Take 1 weekly 12 capsule 3   cyclobenzaprine (FLEXERIL) 5 MG tablet Take 1 tablet (5 mg total) by mouth at bedtime as needed for muscle spasms. 30 tablet 2   Ergocalciferol (VITAMIN D2 PO)      estradiol (ESTRACE) 0.1 MG/GM vaginal cream Place 0.5 g vaginally 2 (two) times a week as directed 30 g 11   guaiFENesin-codeine (ROBITUSSIN AC) 100-10 MG/5ML syrup      hydrochlorothiazide (HYDRODIURIL) 12.5 MG tablet Take 1 tablet (12.5 mg total) by mouth daily. Use once daily as needed for swelling in hands 30 tablet 1   HYDROcodone-acetaminophen (NORCO) 7.5-325 MG tablet Take 1 tablet by mouth 5 (five) times daily as needed for moderate pain. 150 tablet 0   ipratropium (ATROVENT) 0.06 % nasal spray Place 2 sprays into both nostrils 3 (three) times daily. 15 mL 5   losartan (COZAAR) 25 MG tablet Take 1 tablet (25 mg total) by mouth daily. 90 tablet 3   memantine (NAMENDA) 5 MG tablet Take  1 tablet (5 mg total) by mouth at bedtime. 30 tablet 11    methenamine (HIPREX) 1 g tablet Take 1 tablet (1 g total) by mouth 2 (two) times daily with a meal. 60 tablet 11   mirabegron ER (MYRBETRIQ) 50 MG TB24 tablet Take 1 tablet (50 mg total) by mouth daily. 90 tablet 2   moxifloxacin (VIGAMOX) 0.5 % ophthalmic solution Place 1 drop into both eyes 3 (three) times daily until Dr. says to stop 12 mL 2   Multiple Vitamins-Minerals (MULTIVITAMIN ADULTS) TABS Take 1 tablet by mouth daily.     neomycin-polymyxin b-dexamethasone (MAXITROL) 3.5-10000-0.1 OINT      phenazopyridine (PYRIDIUM) 200 MG tablet Take 1 tablet (200 mg total) by mouth 3 (three) times daily as needed for pain. 60 tablet 3   potassium chloride (KLOR-CON) 10 MEQ tablet Take 1 tablet by mouth daily.     valACYclovir (VALTREX) 1000 MG tablet Take 1 tablet (1,000 mg total) by mouth 2 (two) times daily. 60 tablet 2   vitamin B-12 (CYANOCOBALAMIN) 1000 MCG tablet Take 1,000 mcg by mouth daily.     No current facility-administered medications on file prior to visit.    Review of Systems:  As per HPI- otherwise negative.   Physical Examination: Vitals:   07/21/22 1042 07/21/22 1059  BP: (!) 143/65 138/75  Pulse: 74   Resp: 16   Temp: (!) 97.5 F (36.4 C)   SpO2: 96%    Vitals:   07/21/22 1042  Weight: 213 lb (96.6 kg)  Height: 5' 6"$  (1.676 m)   Body mass index is 34.38 kg/m. Ideal Body Weight: Weight in (lb) to have BMI = 25: 154.6  GEN: no acute distress. Chronic eye irritation is present bilaterally  HEENT: Atraumatic, Normocephalic.  Ears and Nose: No external deformity. CV: RRR, No M/G/R. No JVD. No thrill. No extra heart sounds. PULM: CTA B, no wheezes, crackles, rhonchi. No retractions. No resp. distress. No accessory muscle use. ABD: S, NT, ND, +BS. No rebound. No HSM. EXTR: No c/c/e PSYCH: Normally interactive. Conversant.    Assessment and Plan: Elevated C-reactive protein (CRP) - Plan: C-reactive protein  Dyslipidemia - Plan: Lipid panel  Eye  irritation  Seen today for follow-up after attending a "[facility]" screening event.  Advised we will repeat her CRP today; however, significance is uncertain.  CRP is not typically used as a general screening test Reassured that minimal elevation of her AST is likely benign.  It was normal the week previous at our clinic.  Would recommend rechecking in 6 months  Patient wonders if her eye symptoms signify a systemic problem.  I advised patient typically ophthalmology would let her know if this was the case.  However certainly I will request notes from her ophthalmologist so I can review  Check lipids today  Signed Lamar Blinks, MD  2/22- received labs as below  Results for orders placed or performed in visit on 07/21/22  C-reactive protein  Result Value Ref Range   CRP <1.0 0.5 - 20.0 mg/dL  Lipid panel  Result Value Ref Range   Cholesterol 118 0 - 200 mg/dL   Triglycerides 84.0 0.0 - 149.0 mg/dL   HDL 44.00 >39.00 mg/dL   VLDL 16.8 0.0 - 40.0 mg/dL   LDL Cholesterol 58 0 - 99 mg/dL   Total CHOL/HDL Ratio 3    NonHDL 74.39    Letter to pt

## 2022-07-17 NOTE — Patient Instructions (Incomplete)
Good to see you again today- recommend shingles series and covid booster at your pharmacy if not done yet  We will repeat your CRP today and see what it shows We can recheck your liver tests in about 6 months but given your recent normal CT and ultrasound I don't think we need to worry about this too much right now

## 2022-07-19 ENCOUNTER — Encounter: Payer: Self-pay | Admitting: *Deleted

## 2022-07-21 ENCOUNTER — Ambulatory Visit (INDEPENDENT_AMBULATORY_CARE_PROVIDER_SITE_OTHER): Payer: Medicare Other | Admitting: Family Medicine

## 2022-07-21 ENCOUNTER — Encounter: Payer: Self-pay | Admitting: Family Medicine

## 2022-07-21 VITALS — BP 138/75 | HR 74 | Temp 97.5°F | Resp 16 | Ht 66.0 in | Wt 213.0 lb

## 2022-07-21 DIAGNOSIS — H5789 Other specified disorders of eye and adnexa: Secondary | ICD-10-CM

## 2022-07-21 DIAGNOSIS — E785 Hyperlipidemia, unspecified: Secondary | ICD-10-CM

## 2022-07-21 DIAGNOSIS — R7982 Elevated C-reactive protein (CRP): Secondary | ICD-10-CM | POA: Diagnosis not present

## 2022-07-22 LAB — C-REACTIVE PROTEIN: CRP: <1 mg/dL (ref 0.5–20.0)

## 2022-07-22 LAB — LIPID PANEL
Cholesterol: 118 mg/dL (ref 0–200)
HDL: 44 mg/dL (ref >39.00)
LDL Cholesterol: 58 mg/dL (ref 0–99)
NonHDL: 74.39
Total CHOL/HDL Ratio: 3
Triglycerides: 84 mg/dL (ref 0.0–149.0)
VLDL: 16.8 mg/dL (ref 0.0–40.0)

## 2022-07-28 ENCOUNTER — Telehealth: Payer: Self-pay

## 2022-07-28 NOTE — Telephone Encounter (Signed)
Crystal Lee call back as directed. Patient stated you asked  for her to call back on this day about her Hydrocodone 7.5-325.   Please advise.

## 2022-07-29 ENCOUNTER — Other Ambulatory Visit: Payer: Self-pay

## 2022-07-29 MED ORDER — HYDROCODONE-ACETAMINOPHEN 10-325 MG PO TABS
1.0000 | ORAL_TABLET | Freq: Four times a day (QID) | ORAL | 0 refills | Status: DC | PRN
  Filled 2022-07-29: qty 120, 30d supply, fill #0

## 2022-07-29 NOTE — Telephone Encounter (Signed)
PMP was Reviewed Hydrocodone e-scribed to pharmacy. Call placed to Crystal Lee regarding the above, she verbalizes understanding.

## 2022-07-29 NOTE — Addendum Note (Signed)
Addended by: Bayard Hugger on: 07/29/2022 10:06 AM   Modules accepted: Orders

## 2022-07-30 ENCOUNTER — Telehealth: Payer: Self-pay | Admitting: Family Medicine

## 2022-07-30 ENCOUNTER — Other Ambulatory Visit (HOSPITAL_COMMUNITY): Payer: Self-pay

## 2022-07-30 DIAGNOSIS — H1820 Unspecified corneal edema: Secondary | ICD-10-CM | POA: Diagnosis not present

## 2022-07-30 DIAGNOSIS — H10501 Unspecified blepharoconjunctivitis, right eye: Secondary | ICD-10-CM | POA: Diagnosis not present

## 2022-07-30 DIAGNOSIS — H0100B Unspecified blepharitis left eye, upper and lower eyelids: Secondary | ICD-10-CM | POA: Diagnosis not present

## 2022-07-30 DIAGNOSIS — H0100A Unspecified blepharitis right eye, upper and lower eyelids: Secondary | ICD-10-CM | POA: Diagnosis not present

## 2022-07-30 MED ORDER — PREDNISOLONE ACETATE 1 % OP SUSP
OPHTHALMIC | 0 refills | Status: DC
  Filled 2022-07-30: qty 10, 28d supply, fill #0

## 2022-07-30 MED ORDER — AZITHROMYCIN 250 MG PO TABS
ORAL_TABLET | ORAL | 2 refills | Status: AC
  Filled 2022-07-30: qty 6, 5d supply, fill #0

## 2022-07-30 NOTE — Telephone Encounter (Signed)
Copied from [place]. Topic: Medicare AWV >> Jul 30, 2022  9:45 AM Devoria Glassing wrote: Reason for CRM: Called patient to schedule Medicare Annual Wellness Visit (AWV). Left message for patient to call back and schedule Medicare Annual Wellness Visit (AWV).  Last date of AWV: 08/07/2021  Please schedule an appointment at any time with [facility].  If any questions, please contact me.  Thank you ,  Sherol Dade; [place] Direct Dial: [phone]

## 2022-08-06 DIAGNOSIS — H16203 Unspecified keratoconjunctivitis, bilateral: Secondary | ICD-10-CM | POA: Diagnosis not present

## 2022-08-06 DIAGNOSIS — H1820 Unspecified corneal edema: Secondary | ICD-10-CM | POA: Diagnosis not present

## 2022-08-09 ENCOUNTER — Ambulatory Visit (INDEPENDENT_AMBULATORY_CARE_PROVIDER_SITE_OTHER): Payer: Medicare Other | Admitting: Obstetrics and Gynecology

## 2022-08-09 ENCOUNTER — Encounter: Payer: Self-pay | Admitting: Obstetrics and Gynecology

## 2022-08-09 VITALS — BP 132/76 | HR 71

## 2022-08-09 DIAGNOSIS — N390 Urinary tract infection, site not specified: Secondary | ICD-10-CM | POA: Diagnosis not present

## 2022-08-09 DIAGNOSIS — R350 Frequency of micturition: Secondary | ICD-10-CM | POA: Diagnosis not present

## 2022-08-09 DIAGNOSIS — R351 Nocturia: Secondary | ICD-10-CM

## 2022-08-09 MED ORDER — MIRABEGRON ER 50 MG PO TB24: 50.0000 mg | ORAL_TABLET | Freq: Every day | ORAL | 3 refills | Status: DC

## 2022-08-09 MED ORDER — PHENAZOPYRIDINE HCL 200 MG PO TABS: 200.0000 mg | ORAL_TABLET | Freq: Three times a day (TID) | ORAL | 3 refills | Status: DC | PRN

## 2022-08-09 MED ORDER — NITROFURANTOIN MONOHYD MACRO 100 MG PO CAPS: 100.0000 mg | ORAL_CAPSULE | Freq: Two times a day (BID) | ORAL | 1 refills | Status: DC

## 2022-08-09 MED ORDER — ESTROGENS CONJUGATED 0.625 MG/GM VA CREA: 1.0000 | TOPICAL_CREAM | VAGINAL | 3 refills | Status: DC

## 2022-08-09 NOTE — Progress Notes (Signed)
[facility] Return Visit  SUBJECTIVE  History of Present Illness: Crystal Lee is a 89 y.o. female seen in follow-up for recurrent UTI and overactive bladder  Maybe had one UTI a few months ago, took macrobid that she had on hand. Denies current burning with urination. She is using the estrogen twice a week. She is requesting a prescription for macrobid since she will be traveling to [place] to visit her daughter this summer.   She is on '50mg'$  Myrbetriq has improved her symptoms. She is urinating about every 2 hours in the day. Sometimes she wakes up every hour at night. Sometimes she feels the myrbetriq works better than others. Overall she feels her symptoms are stable and she does not want to make any changes to her regimen.    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   Past Surgical History: She  has a past surgical history that includes Breast surgery; Cholecystectomy; Total hip arthroplasty (2010); Mastectomy (1998); Joint replacement; Total knee arthroplasty (2001); Back surgery (1973); Skin cancer excision (2016); Appendectomy (1949); Tumor removal (2012); Total hip arthroplasty (Left, 05/09/2015); Abdominal hysterectomy (2010); Gallbladder surgery (2015); Tonsillectomy (1953); Cataract extraction (Bilateral, 2011); Esophageal manometry (N/A, 06/21/2018); 24 hour ph study (N/A, 06/21/2018); PH impedance study (06/21/2018); Anal Rectal manometry (N/A, 09/19/2019); Colonoscopy with propofol (N/A, 06/10/2020); Esophagogastroduodenoscopy (egd) with propofol (N/A,  06/10/2020); biopsy (06/10/2020); Submucosal tattoo injection (06/10/2020); polypectomy (06/10/2020); Colon resection (N/A, 06/12/2020); and Incisional hernia repair (N/A, 09/16/2021).   Medications: She has a current medication list which includes the following prescription(s): aspirin ec, atorvastatin, bacitracin, vitamin d3, conjugated estrogens, cyclobenzaprine, ergocalciferol, estradiol, guaifenesin-codeine, hydrochlorothiazide, hydrocodone-acetaminophen, ipratropium, losartan, memantine, methenamine, moxifloxacin, multivitamin adults, neomycin-polymyxin b-dexamethasone, nitrofurantoin (macrocrystal-monohydrate), potassium chloride, prednisolone acetate, valacyclovir, cyanocobalamin, mirabegron er, and phenazopyridine.   Allergies: Patient is allergic to morphine, propoxyphene, donepezil, morphine and related, and tape.   Social History: Patient  reports that she quit smoking about 46 years ago. Her smoking use included cigarettes. She has a 10.00 pack-year smoking history. She has never used smokeless tobacco. She reports that she does not drink alcohol and does not use drugs.      OBJECTIVE     Physical Exam: Vitals:   08/09/22 1245  BP: 132/76  Pulse: 71     Gen: No apparent distress, A&O x 3.  Detailed Urogynecologic Evaluation:  deferred   ASSESSMENT AND PLAN    Crystal Lee is a 89 y.o. with:  1. Recurrent UTI   2. Nocturia   3. Urinary frequency     rUTI - Symptoms have been stable.  - [facility] but pt reports that premarin is preferred. Rx for premarin sent. Continue using twice a week.  - Rx for course of macrobid sent to pharmacy. We discussed that if she is in town, and has symptoms of infection, then she should come in for a urine sample first prior to taking medication.  - also takes pyridium prn when she has symptoms and refills provided for this.   2. OAB/ Nocturia - Continue Myrbetriq '50mg'$  daily. Refills  provided   Follow up 1 year or sooner if needed  Jaquita Folds, MD  Time spent: I spent 20 minutes dedicated to the care of this patient on the date of this encounter to include pre-visit review of records, face-to-face time with the patient and post visit documentation and ordering medication/ testing.

## 2022-08-12 NOTE — Progress Notes (Signed)
[facility] pharmacy needed clarification of medication direction.   I've contacted them and give them verbal directions.

## 2022-08-16 ENCOUNTER — Encounter: Payer: Self-pay | Admitting: Registered Nurse

## 2022-08-16 ENCOUNTER — Other Ambulatory Visit (HOSPITAL_COMMUNITY): Payer: Self-pay

## 2022-08-16 ENCOUNTER — Encounter: Payer: Medicare Other | Attending: Physical Medicine & Rehabilitation | Admitting: Registered Nurse

## 2022-08-16 VITALS — BP 136/77 | HR 80 | Ht 66.0 in | Wt 213.0 lb

## 2022-08-16 DIAGNOSIS — M7062 Trochanteric bursitis, left hip: Secondary | ICD-10-CM | POA: Diagnosis not present

## 2022-08-16 DIAGNOSIS — R202 Paresthesia of skin: Secondary | ICD-10-CM

## 2022-08-16 DIAGNOSIS — Z5181 Encounter for therapeutic drug level monitoring: Secondary | ICD-10-CM | POA: Diagnosis not present

## 2022-08-16 DIAGNOSIS — S32000S Wedge compression fracture of unspecified lumbar vertebra, sequela: Secondary | ICD-10-CM

## 2022-08-16 DIAGNOSIS — G894 Chronic pain syndrome: Secondary | ICD-10-CM | POA: Diagnosis not present

## 2022-08-16 DIAGNOSIS — Z79891 Long term (current) use of opiate analgesic: Secondary | ICD-10-CM

## 2022-08-16 DIAGNOSIS — M170 Bilateral primary osteoarthritis of knee: Secondary | ICD-10-CM

## 2022-08-16 DIAGNOSIS — M2550 Pain in unspecified joint: Secondary | ICD-10-CM

## 2022-08-16 DIAGNOSIS — M961 Postlaminectomy syndrome, not elsewhere classified: Secondary | ICD-10-CM

## 2022-08-16 DIAGNOSIS — M7061 Trochanteric bursitis, right hip: Secondary | ICD-10-CM

## 2022-08-16 MED ORDER — HYDROCODONE-ACETAMINOPHEN 10-325 MG PO TABS
1.0000 | ORAL_TABLET | Freq: Four times a day (QID) | ORAL | 0 refills | Status: DC | PRN
  Filled 2022-08-16 – 2022-08-27 (×2): qty 120, 30d supply, fill #0

## 2022-08-16 NOTE — Patient Instructions (Addendum)
Appointment on 09/23/2022: Arrive at 11:20  for 11:40 appointment  Call Pharmacy on 08/25/2022, to request refill, you should be able to get your medication on 08/26/2022 0r 08/27/2022.   Call office if you have any problems

## 2022-08-16 NOTE — Progress Notes (Signed)
Subjective:    Patient ID: Crystal Lee, female    DOB: 06/04/1933, 89 y.o.   MRN: [id number]  HPI: Crystal Lee is a 89 y.o. female who returns for follow up appointment for chronic pain and medication refill. She states her pain is located in her lower back pain, bilateral hips and bilateral knees L>R. She also reports tingling and burning into her bilateral lower extremities. She rates her pain 5. Her current exercise regime is walking and performing stretching exercises.  Crystal Lee is 40.00 MME.   Last Oral Swab was Performed on 07/05/2022, it was consistent.     Pain Inventory Average Pain 5 Pain Right Now 5 My pain is sharp, burning, stabbing, tingling, and aching  In the last 24 hours, has pain interfered with the following? General activity 5 Relation with others 3 Enjoyment of life 5 What TIME of day is your pain at its worst? daytime and evening Sleep (in general) Fair  Pain is worse with: walking, bending, standing, and some activites Pain improves with: rest, heat/ice, and medication Relief from Meds: 4  Family History  Problem Relation Age of Onset   Other Mother        complications from flu   Other Father        unsure of cause   Colon cancer Neg Hx    Social History   Socioeconomic History   Marital status: Widowed    Spouse name: Not on file   Number of children: 3   Years of education: 16 years   Highest education level: Not on file  Occupational History   Occupation: Retired  Tobacco Use   Smoking status: Former    Packs/day: 0.50    Years: 20.00    Additional pack years: 0.00    Total pack years: 10.00    Types: Cigarettes    Quit date: 02/03/1976    Years since quitting: 46.5   Smokeless tobacco: Never  Vaping Use   Vaping Use: Never used  Substance and Sexual Activity   Alcohol use: No   Drug use: No   Sexual activity: Not Currently    Birth control/protection: Post-menopausal  Other Topics Concern   Not on file   Social History Narrative   Lives at home with husband.   Right-handed.   No caffeine use.   Social Determinants of Health   Financial Resource Strain: Low Risk  (08/07/2021)   Overall Financial Resource Strain (CARDIA)    Difficulty of Paying Living Expenses: Not hard at all  Food Insecurity: No Food Insecurity (08/07/2021)   Hunger Vital Sign    Worried About Running Out of Food in the Last Year: Never true    Ran Out of Food in the Last Year: Never true  Transportation Needs: No Transportation Needs (08/07/2021)   PRAPARE - Hydrologist (Medical): No    Lack of Transportation (Non-Medical): No  Physical Activity: Not on file  Stress: Not on file  Social Connections: Moderately Isolated (08/07/2021)   Social Connection and Isolation Panel [NHANES]    Frequency of Communication with Friends and Family: More than three times a week    Frequency of Social Gatherings with Friends and Family: More than three times a week    Attends Religious Services: More than 4 times per year    Active Member of [employer] or Organizations: No    Attends Archivist Meetings: Never    Marital Status: Widowed  Past Surgical History:  Procedure Laterality Date   110 HOUR [place] STUDY N/A 06/21/2018   Procedure: 24 HOUR PH STUDY;  Surgeon: Lavena Bullion, DO;  [facility];  Service: Gastroenterology;  Laterality: N/A;  with impedance   ABDOMINAL HYSTERECTOMY  2010   ANAL RECTAL MANOMETRY N/A 09/19/2019   Procedure: ANO RECTAL MANOMETRY;  Surgeon: Mauri Pole, MD;  [facility];  Service: Endoscopy;  Laterality: N/A;   [place]   BIOPSY  06/10/2020   Procedure: BIOPSY;  Surgeon: Lavena Bullion, DO;  [facility];  Service: Gastroenterology;;  EGD and COLON   BREAST SURGERY     CATARACT EXTRACTION Bilateral 2011   CHOLECYSTECTOMY     COLON RESECTION N/A 06/12/2020   Procedure: LAPAROSCOPIC COLON  RESECTION;  Surgeon: [name], MD;  [facility];  Service: General;  Laterality: N/A;   COLONOSCOPY WITH PROPOFOL N/A 06/10/2020   Procedure: COLONOSCOPY WITH PROPOFOL;  Surgeon: Lavena Bullion, DO;  [facility];  Service: Gastroenterology;  Laterality: N/A;   ESOPHAGEAL MANOMETRY N/A 06/21/2018   Procedure: ESOPHAGEAL MANOMETRY (EM);  Surgeon: Lavena Bullion, DO;  [facility];  Service: Gastroenterology;  Laterality: N/A;   ESOPHAGOGASTRODUODENOSCOPY (EGD) WITH PROPOFOL N/A 06/10/2020   Procedure: ESOPHAGOGASTRODUODENOSCOPY (EGD) WITH PROPOFOL;  Surgeon: Lavena Bullion, DO;  [facility];  Service: Gastroenterology;  Laterality: N/A;   GALLBLADDER SURGERY  2015   INCISIONAL HERNIA REPAIR N/A 09/16/2021   Procedure: OPEN INCISIONAL HERNIA REPAIR WITH MESH;  Surgeon: [name], MD;  Location: [facility];  Service: General;  Laterality: N/A;   JOINT REPLACEMENT     RT TOTAL HIP / RT [place]    [place] STUDY  06/21/2018   Procedure: [place] IMPEDANCE STUDY;  Surgeon: Lavena Bullion, DO;  [facility];  Service: Gastroenterology;;   POLYPECTOMY  06/10/2020   Procedure: POLYPECTOMY;  Surgeon: Lavena Bullion, DO;  [facility];  Service: Gastroenterology;;   SKIN CANCER EXCISION  2016   RT SIDE OF NOSE   SUBMUCOSAL TATTOO INJECTION  06/10/2020   Procedure: SUBMUCOSAL TATTOO INJECTION;  Surgeon: Lavena Bullion, DO;  [facility];  Service: Gastroenterology;;   [place]  2010   RIGHT   TOTAL HIP ARTHROPLASTY Left 05/09/2015   Procedure: LEFT TOTAL HIP ARTHROPLASTY ANTERIOR APPROACH;  Surgeon: Gaynelle Arabian, MD;  [facility];  Service: Orthopedics;  Laterality: Left;   TOTAL KNEE ARTHROPLASTY  2001   TUMOR REMOVAL  2012   ABDOMINAL - NON CANCEROUS   Past Surgical History:  Procedure Laterality Date   78 HOUR [place] STUDY N/A  06/21/2018   Procedure: 24 HOUR PH STUDY;  Surgeon: Lavena Bullion, DO;  [facility];  Service: Gastroenterology;  Laterality: N/A;  with impedance   ABDOMINAL HYSTERECTOMY  2010   ANAL RECTAL MANOMETRY N/A 09/19/2019   Procedure: ANO RECTAL MANOMETRY;  Surgeon: Mauri Pole, MD;  [facility];  Service: Endoscopy;  Laterality: N/A;   [place]   BIOPSY  06/10/2020   Procedure: BIOPSY;  Surgeon: Lavena Bullion, DO;  [facility];  Service: Gastroenterology;;  EGD and COLON   BREAST SURGERY     CATARACT EXTRACTION Bilateral 2011   CHOLECYSTECTOMY     COLON RESECTION N/A 06/12/2020  Procedure: LAPAROSCOPIC COLON RESECTION;  Surgeon: [name], MD;  [facility];  Service: General;  Laterality: N/A;   COLONOSCOPY WITH PROPOFOL N/A 06/10/2020   Procedure: COLONOSCOPY WITH PROPOFOL;  Surgeon: Lavena Bullion, DO;  [facility];  Service: Gastroenterology;  Laterality: N/A;   ESOPHAGEAL MANOMETRY N/A 06/21/2018   Procedure: ESOPHAGEAL MANOMETRY (EM);  Surgeon: Lavena Bullion, DO;  [facility];  Service: Gastroenterology;  Laterality: N/A;   ESOPHAGOGASTRODUODENOSCOPY (EGD) WITH PROPOFOL N/A 06/10/2020   Procedure: ESOPHAGOGASTRODUODENOSCOPY (EGD) WITH PROPOFOL;  Surgeon: Lavena Bullion, DO;  [facility];  Service: Gastroenterology;  Laterality: N/A;   GALLBLADDER SURGERY  2015   INCISIONAL HERNIA REPAIR N/A 09/16/2021   Procedure: OPEN INCISIONAL HERNIA REPAIR WITH MESH;  Surgeon: [name], MD;  Location: [facility];  Service: General;  Laterality: N/A;   JOINT REPLACEMENT     RT TOTAL HIP / RT [place]    [place] STUDY  06/21/2018   Procedure: [place] IMPEDANCE STUDY;  Surgeon: Lavena Bullion, DO;  [facility];  Service: Gastroenterology;;   POLYPECTOMY  06/10/2020   Procedure: POLYPECTOMY;  Surgeon: Lavena Bullion,  DO;  [facility];  Service: Gastroenterology;;   SKIN CANCER EXCISION  2016   RT SIDE OF NOSE   SUBMUCOSAL TATTOO INJECTION  06/10/2020   Procedure: SUBMUCOSAL TATTOO INJECTION;  Surgeon: Lavena Bullion, DO;  [facility];  Service: Gastroenterology;;   [place]  2010   RIGHT   TOTAL HIP ARTHROPLASTY Left 05/09/2015   Procedure: LEFT TOTAL HIP ARTHROPLASTY ANTERIOR APPROACH;  Surgeon: Gaynelle Arabian, MD;  [facility];  Service: Orthopedics;  Laterality: Left;   TOTAL KNEE ARTHROPLASTY  2001   TUMOR REMOVAL  2012   ABDOMINAL - NON CANCEROUS   Past Medical History:  Diagnosis Date   Arthritis    Cancer ([place])    HX BREAST CANCER/ SKIN CANCER   Colon cancer ([place])    Complication of anesthesia    N/V WITH MORPHINE   Difficulty sleeping    Fractured hip ([place])    LEFT - AUG 2016   GERD (gastroesophageal reflux disease)    Hyperlipidemia    Hypertension    Melanoma ([place])    Neuropathy    Nocturia    Osteopenia    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   PONV (postoperative nausea and vomiting)    PT STATES MORPHINE CAUSED N/V   Rosacea    Sleep apnea    Stage 1 breast cancer, ER+, right ([place]) 07/04/2017   BP 136/77   Pulse 80   Ht 5\' 6"  (1.676 m)   Wt 213 lb (96.6 kg)   SpO2 95%   BMI 34.38 kg/m   Opioid Risk Score:   Fall Risk Score:  `1  Depression screen [facility] 2/9     07/05/2022   11:17 AM 05/25/2022   11:39 AM 04/05/2022    1:03 PM 01/12/2022    1:42 PM 10/30/2021    1:08 PM 09/29/2021   12:54 PM 09/01/2021    1:29 PM  Depression screen PHQ 2/9  Decreased Interest 0 0 0 0 0 0 0  Down, Depressed, Hopeless 0 0 0 0 0 0 0  PHQ - 2 Score 0 0 0 0 0 0 0     Review of Systems  Musculoskeletal:  Positive for back pain.  B/L leg and foot pain  All other systems reviewed and are negative.      Objective:   Physical Exam Vitals and nursing note reviewed.  Constitutional:      Appearance:  Normal appearance.  Cardiovascular:     Rate and Rhythm: Normal rate and regular rhythm.     Pulses: Normal pulses.     Heart sounds: Normal heart sounds.  Pulmonary:     Effort: Pulmonary effort is normal.     Breath sounds: Normal breath sounds.  Musculoskeletal:     Cervical back: Normal range of motion and neck supple.     Comments: Normal Muscle Bulk and Muscle Testing Reveals:  Upper Extremities: Full ROM and Muscle Strength 5/5  Lumbar Paraspinal Tenderness: L-4- L-5  Bilateral Greater Trochanter Tenderness  Lower Extremities: Full ROM and Muscle Strength 5/5 Left Lower Extremity Flexion Produces Pain into Left Patella Arises from table slowly using walker for support Narrow Based  Gait     Skin:    General: Skin is warm and dry.  Neurological:     Mental Status: She is alert and oriented to person, place, and time.  Psychiatric:        Mood and Affect: Mood normal.        Behavior: Behavior normal.         Assessment & Plan:  1. Chronic Bilateral Leg pain: Paresthesia: Continue to Monitor. 08/16/2022 2. Paresthesia Doreene Burke Radiculitis: Crystal Lee has weaned herself off the Lyrica due to daytime drowsiness. We will continue to monitor. 08/16/2022 3. Pain of Left Wrist/ : No complaints today. S/P Carpal Tunnel Release on 06/03/2017 by Dr. Ellene Route. Dr. Ellene Route Following. 08/16/2022. 4. Fracture of superior pubic ramus.  Dr. Wynelle Link Following.  Continue to monitor. 08/16/2022. 5. Bilateral Knee OA: Continue Voltaren Gel.  Continue to monitor. Orthopedist following. 08/16/2022 6. Polyarthralgia: Continue to alternate with heat and ice therapy. Continue current medication regime. Continue to monitor. 08/16/2022. 7. Chronic Pain Syndrome: Refilled::Hydrocodone  10/325 one tablet 4 times a day as needed for pain #120.  8. Lumbar Compression Fracture L2 and L3:  Crystal Lee refused physical therapy. Continue with rest/ heat therapy. Continue to Monitor 08/16/2022. 9. Muscle Spasm:  Continue  Flexeril 5 mg at HS. Continue to monitor. 08/16/2022 10. Right Shoulder Tendonitis: No complaints today. Continue HEP as Tolerated. Alternate Ice and Heat Therapy. 08/16/2022  11. Greater Trochanter Bursitis: Continue to Monitor. Continue HEP as Tolerated. 08/16/2022   F/U in 1 Month

## 2022-08-20 DIAGNOSIS — H1789 Other corneal scars and opacities: Secondary | ICD-10-CM | POA: Diagnosis not present

## 2022-08-20 DIAGNOSIS — H1820 Unspecified corneal edema: Secondary | ICD-10-CM | POA: Diagnosis not present

## 2022-08-23 ENCOUNTER — Telehealth: Payer: Self-pay | Admitting: Family Medicine

## 2022-08-23 ENCOUNTER — Ambulatory Visit: Payer: Medicare Other | Admitting: Family Medicine

## 2022-08-23 NOTE — Telephone Encounter (Signed)
Contacted [name] to schedule their annual wellness visit. Appointment made for 08/31/2022.  Sherol Dade; Care Guide Ambulatory Clinical [facility] Direct Dial: [phone]

## 2022-08-24 DIAGNOSIS — R1012 Left upper quadrant pain: Secondary | ICD-10-CM | POA: Diagnosis not present

## 2022-08-25 ENCOUNTER — Other Ambulatory Visit (HOSPITAL_COMMUNITY): Payer: Self-pay

## 2022-08-26 ENCOUNTER — Other Ambulatory Visit: Payer: Self-pay | Admitting: Surgery

## 2022-08-26 DIAGNOSIS — R1012 Left upper quadrant pain: Secondary | ICD-10-CM

## 2022-08-27 ENCOUNTER — Other Ambulatory Visit (HOSPITAL_COMMUNITY): Payer: Self-pay

## 2022-08-27 ENCOUNTER — Other Ambulatory Visit: Payer: Self-pay

## 2022-08-30 ENCOUNTER — Ambulatory Visit: Payer: Medicare Other | Admitting: Family Medicine

## 2022-08-31 ENCOUNTER — Other Ambulatory Visit: Payer: Self-pay

## 2022-08-31 ENCOUNTER — Telehealth: Payer: Self-pay | Admitting: Family Medicine

## 2022-08-31 ENCOUNTER — Ambulatory Visit (INDEPENDENT_AMBULATORY_CARE_PROVIDER_SITE_OTHER): Payer: Medicare Other | Admitting: *Deleted

## 2022-08-31 ENCOUNTER — Other Ambulatory Visit (INDEPENDENT_AMBULATORY_CARE_PROVIDER_SITE_OTHER): Payer: Medicare Other

## 2022-08-31 VITALS — BP 137/75 | HR 76 | Ht 66.0 in | Wt 210.0 lb

## 2022-08-31 DIAGNOSIS — R300 Dysuria: Secondary | ICD-10-CM

## 2022-08-31 DIAGNOSIS — Z780 Asymptomatic menopausal state: Secondary | ICD-10-CM | POA: Diagnosis not present

## 2022-08-31 DIAGNOSIS — Z0000 Encounter for general adult medical examination without abnormal findings: Secondary | ICD-10-CM

## 2022-08-31 LAB — URINALYSIS, ROUTINE W REFLEX MICROSCOPIC
Bilirubin Urine: NEGATIVE
Ketones, ur: NEGATIVE
Nitrite: POSITIVE — AB
Specific Gravity, Urine: 1.02 (ref 1.000–1.030)
Total Protein, Urine: 30 — AB
Urine Glucose: NEGATIVE
Urobilinogen, UA: 1 (ref 0.0–1.0)
pH: 6.5 (ref 5.0–8.0)

## 2022-08-31 MED ORDER — CEPHALEXIN 500 MG PO CAPS: 500.0000 mg | ORAL_CAPSULE | Freq: Two times a day (BID) | ORAL | 0 refills | Status: DC

## 2022-08-31 NOTE — Telephone Encounter (Signed)
Called pt- her UA is dirty Await culture Can start her on abx now however- will [facility] I called pt the no answer, the voice on her VM does not sound like her.  I also called and LMOM with Gregary Signs, her daughter letting her know that I sent in an rx.  Will be back in touch with her urine culture

## 2022-08-31 NOTE — Progress Notes (Signed)
Subjective:   Crystal Lee is a 89 y.o. female who presents for Medicare Annual (Subsequent) preventive examination.  Review of Systems     Cardiac Risk Factors include: advanced age (>68men, >86 women);dyslipidemia;hypertension     Objective:    Today's Vitals   08/31/22 1256  BP: 137/75  Pulse: 76  Weight: 210 lb (95.3 kg)  Height: 5\' 6"  (1.676 m)   Body mass index is 33.89 kg/m.     08/31/2022    1:05 PM 07/12/2022   11:08 AM 06/09/2022   12:16 PM 03/09/2022    1:51 PM 01/28/2022    1:30 PM 01/12/2022    1:42 PM 01/07/2022   11:23 AM  Advanced Directives  Does Patient Have a Medical Advance Directive? Yes Yes Yes Yes Yes Yes Yes  Type of Paramedic of [place];Living will  [place];Living will [place];Living will [place];Living will [place];Living will   Does patient want to make changes to medical advance directive? No - Patient declined  No - Patient declined No - Patient declined     Copy of [place] in Chart? No - copy requested  No - copy requested No - copy requested     Would patient like information on creating a medical advance directive?   No - Patient declined No - Patient declined       Current Medications (verified) Outpatient Encounter Medications as of 08/31/2022  Medication Sig   aspirin EC 81 MG tablet Take 81 mg by mouth daily. Swallow whole.   atorvastatin (LIPITOR) 20 MG tablet Take 1 tablet (20 mg total) by mouth daily.   bacitracin ophthalmic ointment Apply to eyelids at bedtime as needed.   Cholecalciferol (VITAMIN D3) 1.25 MG (50000 UT) CAPS Take 1 weekly   conjugated estrogens (PREMARIN) vaginal cream Place 1 Applicatorful vaginally 2 (two) times a week. Place 0.5g nightly twice a week   cyclobenzaprine (FLEXERIL) 5 MG tablet Take 1 tablet (5 mg total) by mouth at bedtime as needed for muscle spasms.   Ergocalciferol  (VITAMIN D2 PO)    estradiol (ESTRACE) 0.1 MG/GM vaginal cream Place 0.5 g vaginally 2 (two) times a week as directed   guaiFENesin-codeine (ROBITUSSIN AC) 100-10 MG/5ML syrup    hydrochlorothiazide (HYDRODIURIL) 12.5 MG tablet Take 1 tablet (12.5 mg total) by mouth daily. Use once daily as needed for swelling in hands   HYDROcodone-acetaminophen (NORCO) 10-325 MG tablet Take 1 tablet by mouth every 6 (six) hours as needed. (3/28)   ipratropium (ATROVENT) 0.06 % nasal spray Place 2 sprays into both nostrils 3 (three) times daily.   losartan (COZAAR) 25 MG tablet Take 1 tablet (25 mg total) by mouth daily.   memantine (NAMENDA) 5 MG tablet Take 1 tablet (5 mg total) by mouth at bedtime.   methenamine (HIPREX) 1 g tablet Take 1 tablet (1 g total) by mouth 2 (two) times daily with a meal.   mirabegron ER (MYRBETRIQ) 50 MG TB24 tablet Take 1 tablet (50 mg total) by mouth daily.   moxifloxacin (VIGAMOX) 0.5 % ophthalmic solution Place 1 drop into both eyes 3 (three) times daily until Dr. says to stop   Multiple Vitamins-Minerals (MULTIVITAMIN ADULTS) TABS Take 1 tablet by mouth daily.   neomycin-polymyxin b-dexamethasone (MAXITROL) 3.5-10000-0.1 OINT    nitrofurantoin, macrocrystal-monohydrate, (MACROBID) 100 MG capsule Take 1 capsule (100 mg total) by mouth 2 (two) times daily.   phenazopyridine (PYRIDIUM) 200  MG tablet Take 1 tablet (200 mg total) by mouth 3 (three) times daily as needed for pain.   potassium chloride (KLOR-CON) 10 MEQ tablet Take 1 tablet by mouth daily.   prednisoLONE acetate (PRED FORTE) 1 % ophthalmic suspension Place 1 drop in right eye 4 times daily for 1 week, the twice daily for 1 week.   valACYclovir (VALTREX) 1000 MG tablet Take 1 tablet (1,000 mg total) by mouth 2 (two) times daily.   vitamin B-12 (CYANOCOBALAMIN) 1000 MCG tablet Take 1,000 mcg by mouth daily.   No facility-administered encounter medications on file as of 08/31/2022.    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   PONV (postoperative nausea and vomiting)    PT STATES MORPHINE CAUSED N/V   Rosacea    Sleep apnea    Stage 1 breast cancer, ER+, right 07/04/2017   Past Surgical History:  Procedure Laterality Date   23 HOUR [place] STUDY N/A 06/21/2018   Procedure: 24 HOUR [place] STUDY;  Surgeon: Lavena Bullion, DO;  Location: [facility];  Service: Gastroenterology;  Laterality: N/A;  with impedance   ABDOMINAL HYSTERECTOMY  2010   ANAL RECTAL MANOMETRY N/A 09/19/2019   Procedure: ANO RECTAL MANOMETRY;  Surgeon: Mauri Pole, MD;  Location: [facility];  Service: Endoscopy;  Laterality: N/A;   [place]   BIOPSY  06/10/2020   Procedure: BIOPSY;  Surgeon: Lavena Bullion, DO;  [facility];  Service: Gastroenterology;;  EGD and COLON   BREAST SURGERY     CATARACT EXTRACTION Bilateral 2011   CHOLECYSTECTOMY     COLON RESECTION N/A 06/12/2020   Procedure: LAPAROSCOPIC COLON RESECTION;  Surgeon: [name], MD;  [facility];  Service: General;  Laterality: N/A;   COLONOSCOPY WITH PROPOFOL N/A 06/10/2020   Procedure: COLONOSCOPY WITH PROPOFOL;  Surgeon: Lavena Bullion, DO;  [facility];  Service: Gastroenterology;  Laterality: N/A;   ESOPHAGEAL MANOMETRY N/A 06/21/2018   Procedure: ESOPHAGEAL MANOMETRY (EM);  Surgeon: Lavena Bullion, DO;  [facility];  Service: Gastroenterology;  Laterality: N/A;   ESOPHAGOGASTRODUODENOSCOPY (EGD) WITH PROPOFOL N/A  06/10/2020   Procedure: ESOPHAGOGASTRODUODENOSCOPY (EGD) WITH PROPOFOL;  Surgeon: Lavena Bullion, DO;  [facility];  Service: Gastroenterology;  Laterality: N/A;   GALLBLADDER SURGERY  2015   INCISIONAL HERNIA REPAIR N/A 09/16/2021   Procedure: OPEN INCISIONAL HERNIA REPAIR WITH MESH;  Surgeon: [name], MD;  Location: [facility];  Service: General;  Laterality: N/A;   JOINT REPLACEMENT     RT TOTAL HIP / RT [place]    [place] STUDY  06/21/2018   Procedure: [place] IMPEDANCE STUDY;  Surgeon: Lavena Bullion, DO;  [facility];  Service: Gastroenterology;;   POLYPECTOMY  06/10/2020   Procedure: POLYPECTOMY;  Surgeon: Lavena Bullion, DO;  [facility];  Service: Gastroenterology;;   SKIN CANCER EXCISION  2016   RT SIDE OF NOSE   SUBMUCOSAL TATTOO INJECTION  06/10/2020   Procedure: SUBMUCOSAL TATTOO INJECTION;  Surgeon: Lavena Bullion, DO;  [facility];  Service: Gastroenterology;;   TONSILLECTOMY  1953   TOTAL HIP ARTHROPLASTY  2010   RIGHT   TOTAL HIP ARTHROPLASTY Left 05/09/2015   Procedure: LEFT TOTAL HIP ARTHROPLASTY ANTERIOR APPROACH;  Surgeon: Gaynelle Arabian, MD;  [facility];  Service: Orthopedics;  Laterality: Left;   TOTAL KNEE ARTHROPLASTY  2001   TUMOR REMOVAL  2012   ABDOMINAL - NON CANCEROUS   Family History  Problem Relation Age of Onset   Other Mother        complications from flu   Other Father        unsure of cause   Colon cancer Neg Hx    Social History   Socioeconomic History   Marital status: Widowed    Spouse name: Not on file   Number of children: 3   Years of education: 16 years   Highest education level: Not on file  Occupational History   Occupation: Retired  Tobacco Use   Smoking status: Former    Packs/day: 0.50    Years: 20.00    Additional pack years: 0.00    Total pack years: 10.00    Types: Cigarettes    Quit date: 02/03/1976    Years since  quitting: 46.6   Smokeless tobacco: Never  Vaping Use   Vaping Use: Never used  Substance and Sexual Activity   Alcohol use: No   Drug use: No   Sexual activity: Not Currently    Birth control/protection: Post-menopausal  Other Topics Concern   Not on file  Social History Narrative   Lives at home with husband.   Right-handed.   No caffeine use.   Social Determinants of Health   Financial Resource Strain: Low Risk  (08/07/2021)   Overall Financial Resource Strain (CARDIA)    Difficulty of Paying Living Expenses: Not hard at all  Food Insecurity: No Food Insecurity (08/31/2022)   Hunger Vital Sign    Worried About Running Out of Food in the Last Year: Never true    Ran Out of Food in the Last Year: Never true  Transportation Needs: No Transportation Needs (08/31/2022)   PRAPARE - Hydrologist (Medical): No    Lack of Transportation (Non-Medical): No  Physical Activity: Inactive (08/31/2022)   Exercise Vital Sign    Days of Exercise per Week: 0 days    Minutes of Exercise per Session: 0 min  Stress: No Stress Concern Present (08/31/2022)   [place]    Feeling of Stress : Not at all  Social Connections: Moderately Isolated (08/07/2021)   Social Connection and Isolation Panel [NHANES]    Frequency of Communication with Friends and Family: More than three times a week    Frequency of Social Gatherings with Friends and Family: More than three times a week    Attends Religious Services: More than 4 times per year    Active Member of [employer] or Organizations: No    Attends Archivist Meetings: Never    Marital Status: Widowed    Tobacco Counseling Counseling given: Not Answered   Clinical Intake:  Pre-visit preparation completed: Yes  Pain : No/denies pain     Nutritional Risks: None Diabetes: Yes CBG done?: No Did  pt. bring in CBG monitor from home?: No  How often do  you need to have someone help you when you read instructions, pamphlets, or other written materials from your doctor or pharmacy?: 1 - Never   Activities of Daily Living    08/31/2022    1:09 PM 09/21/2021    1:16 PM  In your present state of health, do you have any difficulty performing the following activities:  Hearing? 1   Vision? 1   Difficulty concentrating or making decisions? 0   Walking or climbing stairs? 1   Dressing or bathing? 0   Doing errands, shopping? 0 0  Preparing Food and eating ? N   Using the Toilet? N   In the past six months, have you accidently leaked urine? Y   Do you have problems with loss of bowel control? N   Managing your Medications? N   Managing your Finances? N   Housekeeping or managing your Housekeeping? N     Patient Care Team: Copland, Gay Filler, MD as PCP - General (Family Medicine) [name], MD as Medical Oncologist (Oncology) Jaquita Folds, MD as Consulting Physician (Obstetrics and Gynecology)  Indicate any recent Medical Services you may have received from other than [facility] providers in the past year (date may be approximate).     Assessment:   This is a routine wellness examination for [place].  Hearing/Vision screen No results found.  Dietary issues and exercise activities discussed: Current Exercise Habits: The patient does not participate in regular exercise at present, Exercise limited by: orthopedic condition(s)   Goals Addressed   None    Depression Screen    08/31/2022    1:08 PM 08/16/2022   11:13 AM 07/05/2022   11:17 AM 05/25/2022   11:39 AM 04/05/2022    1:03 PM 01/12/2022    1:42 PM 10/30/2021    1:08 PM  PHQ 2/9 Scores  PHQ - 2 Score 0 0 0 0 0 0 0    Fall Risk    08/31/2022    1:09 PM 08/16/2022   11:13 AM 07/12/2022   11:08 AM 07/05/2022   11:17 AM 05/25/2022   11:39 AM  [place] in the past year? 0 0 0 0 0  Number falls in past yr: 0 0 0 0 0  Injury with Fall? 0 0 0 0 0  Risk for fall  due to : Impaired balance/gait      Follow up Falls evaluation completed  Falls evaluation completed      FALL RISK PREVENTION PERTAINING TO THE HOME:  Any stairs in or around the home? No  Home free of loose throw rugs in walkways, pet beds, electrical cords, etc? Yes  Adequate lighting in your home to reduce risk of falls? Yes   ASSISTIVE DEVICES UTILIZED TO PREVENT FALLS:  Life alert? No  Use of a cane, walker or w/c? Yes  Grab bars in the bathroom? Yes  Shower chair or bench in shower? Yes  Elevated toilet seat or a handicapped toilet?  Comfort height  TIMED UP AND GO:  Was the test performed? Yes .  Length of time to ambulate 10 feet: 10 sec.   Gait slow and steady with assistive device  Cognitive Function:    08/31/2022    2:13 PM  MMSE - Mini Mental State Exam  Not completed: Unable to complete      05/05/2021   12:00 PM  Montreal Cognitive Assessment  Visuospatial/ Executive (0/5) 2  Naming (0/3) 3  Attention: Read list of digits (0/2) 2  Attention: Read list of letters (0/1) 1  Attention: Serial 7 subtraction starting at 100 (0/3) 1  Language: Repeat phrase (0/2) 1  Language : Fluency (0/1) 0  Abstraction (0/2) 2  Delayed Recall (0/5) 0  Orientation (0/6) 6  Total 18      08/07/2021    3:35 PM  6CIT Screen  What Year? 0 points  What month? 0 points  What time? 0 points  Count back from 20 0 points  Months in reverse 0 points  Repeat phrase 0 points  Total Score 0 points    Immunizations Immunization History  Administered Date(s) Administered   Fluad Quad(high Dose 65+) 06/29/2019, 04/01/2021, 02/22/2022   Influenza, High Dose Seasonal PF 02/02/2018   Influenza,inj,Quad PF,6+ Mos 07/17/2020   Influenza,inj,quad, With Preservative 05/07/2017   Moderna SARS-COV2 Booster Vaccination 09/18/2020   Moderna Sars-Covid-2 Vaccination 07/27/2019, 08/24/2019   Pneumococcal Conjugate-13 02/07/2014   Pneumococcal Polysaccharide-23 03/21/2002   Tdap  02/09/2016   Zoster, Live 06/08/2011    TDAP status: Up to date  Flu Vaccine status: Up to date  Pneumococcal vaccine status: Up to date  Covid-19 vaccine status: Information provided on how to obtain vaccines.   Qualifies for Shingles Vaccine? Yes   Zostavax completed Yes   Shingrix Completed?: No.    Education has been provided regarding the importance of this vaccine. Patient has been advised to call insurance company to determine out of pocket expense if they have not yet received this vaccine. Advised may also receive vaccine at local pharmacy or Health Dept. Verbalized acceptance and understanding.  Screening Tests Health Maintenance  Topic Date Due   Zoster Vaccines- Shingrix (1 of 2) Never done   COVID-19 Vaccine (3 - Moderna risk series) 10/16/2020   Medicare Annual Wellness (AWV)  08/08/2022   INFLUENZA VACCINE  12/30/2022   COLONOSCOPY (Pts 45-84yrs Insurance coverage will need to be confirmed)  06/18/2024   DTaP/Tdap/Td (2 - Td or Tdap) 02/08/2026   Pneumonia Vaccine 80+ Years old  Completed   DEXA SCAN  Completed   HPV VACCINES  Aged Out    Health Maintenance  Health Maintenance Due  Topic Date Due   Zoster Vaccines- Shingrix (1 of 2) Never done   COVID-19 Vaccine (3 - Moderna risk series) 10/16/2020   Medicare Annual Wellness (AWV)  08/08/2022    Colorectal cancer screening: No longer required.   Mammogram status: No longer required due to surgery.  Bone Density status: Ordered 08/31/22. Pt provided with contact info and advised to call to schedule appt.  Lung Cancer Screening: (Low Dose CT Chest recommended if Age 89-80 years, 30 pack-year currently smoking OR have quit w/in 15years.) does not qualify.   Additional Screening:  Hepatitis C Screening: does not qualify  Vision Screening: Recommended annual ophthalmology exams for early detection of glaucoma and other disorders of the eye. Is the patient up to date with their annual eye exam?  Yes  Who is  the provider or what is the name of the office in which the patient attends annual eye exams? Can't remember name at this time If pt is not established with a provider, would they like to be referred to a provider to establish care? No .   Dental Screening: Recommended annual dental exams for proper oral hygiene  Community Resource Referral / Chronic Care Management: CRR required this visit?  No   CCM required  this visit?  No      Plan:     I have personally reviewed and noted the following in the patient's chart:   Medical and social history Use of alcohol, tobacco or illicit drugs  Current medications and supplements including opioid prescriptions. Patient is currently taking opioid prescriptions. Information provided to patient regarding non-opioid alternatives. Patient advised to discuss non-opioid treatment plan with their provider. Functional ability and status Nutritional status Physical activity Advanced directives List of other physicians Hospitalizations, surgeries, and ER visits in previous 12 months Vitals Screenings to include cognitive, depression, and falls Referrals and appointments  In addition, I have reviewed and discussed with patient certain preventive protocols, quality metrics, and best practice recommendations. A written personalized care plan for preventive services as well as general preventive health recommendations were provided to patient.     Beatris Ship, [place]   08/31/2022   Nurse Notes: None

## 2022-08-31 NOTE — Telephone Encounter (Signed)
I have ordered a urine and urine culture for the pt.

## 2022-08-31 NOTE — Telephone Encounter (Signed)
Pt was wondering if pcp could order a test for a uti. She has an awv today in office so was hoping to get it done then if possible. There are no more openings with any other providers today.

## 2022-08-31 NOTE — Patient Instructions (Signed)
Crystal Lee , Thank you for taking time to come for your Medicare Wellness Visit. I appreciate your ongoing commitment to your health goals. Please review the following plan we discussed and let me know if I can assist you in the future.   These are the goals we discussed:  Goals   None     This is a list of the screening recommended for you and due dates:  Health Maintenance  Topic Date Due   Zoster (Shingles) Vaccine (1 of 2) Never done   COVID-19 Vaccine (3 - Moderna risk series) 10/16/2020   Flu Shot  12/30/2022   Medicare Annual Wellness Visit  08/31/2023   Colon Cancer Screening  06/18/2024   DTaP/Tdap/Td vaccine (2 - Td or Tdap) 02/08/2026   Pneumonia Vaccine  Completed   DEXA scan (bone density measurement)  Completed   HPV Vaccine  Aged Out     Next appointment: Follow up in one year for your annual wellness visit.   Preventive Care 87 Years and Older, Female Preventive care refers to lifestyle choices and visits with your health care provider that can promote health and wellness. What does preventive care include? A yearly physical exam. This is also called an annual well check. Dental exams once or twice a year. Routine eye exams. Ask your health care provider how often you should have your eyes checked. Personal lifestyle choices, including: Daily care of your teeth and gums. Regular physical activity. Eating a healthy diet. Avoiding tobacco and drug use. Limiting alcohol use. Practicing safe sex. Taking low-dose aspirin every day. Taking vitamin and mineral supplements as recommended by your health care provider. What happens during an annual well check? The services and screenings done by your health care provider during your annual well check will depend on your age, overall health, lifestyle risk factors, and family history of disease. Counseling  Your health care provider may ask you questions about your: Alcohol use. Tobacco use. Drug use. Emotional  well-being. Home and relationship well-being. Sexual activity. Eating habits. History of falls. Memory and ability to understand (cognition). Work and work Statistician. Reproductive health. Screening  You may have the following tests or measurements: Height, weight, and BMI. Blood pressure. Lipid and cholesterol levels. These may be checked every 5 years, or more frequently if you are over 17 years old. Skin check. Lung cancer screening. You may have this screening every year starting at age 87 if you have a 30-pack-year history of smoking and currently smoke or have quit within the past 15 years. Fecal occult blood test (FOBT) of the stool. You may have this test every year starting at age 87. Flexible sigmoidoscopy or colonoscopy. You may have a sigmoidoscopy every 5 years or a colonoscopy every 10 years starting at age 87. Hepatitis C blood test. Hepatitis B blood test. Sexually transmitted disease (STD) testing. Diabetes screening. This is done by checking your blood sugar (glucose) after you have not eaten for a while (fasting). You may have this done every 1-3 years. Bone density scan. This is done to screen for osteoporosis. You may have this done starting at age 87. Mammogram. This may be done every 1-2 years. Talk to your health care provider about how often you should have regular mammograms. Talk with your health care provider about your test results, treatment options, and if necessary, the need for more tests. Vaccines  Your health care provider may recommend certain vaccines, such as: Influenza vaccine. This is recommended every year. Tetanus, diphtheria,  and acellular pertussis (Tdap, Td) vaccine. You may need a Td booster every 10 years. Zoster vaccine. You may need this after age 87. Pneumococcal 13-valent conjugate (PCV13) vaccine. One dose is recommended after age 87. Pneumococcal polysaccharide (PPSV23) vaccine. One dose is recommended after age 87. Talk to your  health care provider about which screenings and vaccines you need and how often you need them. This information is not intended to replace advice given to you by your health care provider. Make sure you discuss any questions you have with your health care provider. Document Released: 06/13/2015 Document Revised: 02/04/2016 Document Reviewed: 03/18/2015 Elsevier Interactive Patient Education  2017 [employer] Prevention in the Home Falls can cause injuries. They can happen to people of all ages. There are many things you can do to make your home safe and to help prevent falls. What can I do on the outside of my home? Regularly fix the edges of walkways and driveways and fix any cracks. Remove anything that might make you trip as you walk through a door, such as a raised step or threshold. Trim any bushes or trees on the path to your home. Use bright outdoor lighting. Clear any walking paths of anything that might make someone trip, such as rocks or tools. Regularly check to see if handrails are loose or broken. Make sure that both sides of any steps have handrails. Any raised decks and porches should have guardrails on the edges. Have any leaves, snow, or ice cleared regularly. Use sand or salt on walking paths during winter. Clean up any spills in your garage right away. This includes oil or grease spills. What can I do in the bathroom? Use night lights. Install grab bars by the toilet and in the tub and shower. Do not use towel bars as grab bars. Use non-skid mats or decals in the tub or shower. If you need to sit down in the shower, use a plastic, non-slip stool. Keep the floor dry. Clean up any water that spills on the floor as soon as it happens. Remove soap buildup in the tub or shower regularly. Attach bath mats securely with double-sided non-slip rug tape. Do not have throw rugs and other things on the floor that can make you trip. What can I do in the bedroom? Use night  lights. Make sure that you have a light by your bed that is easy to reach. Do not use any sheets or blankets that are too big for your bed. They should not hang down onto the floor. Have a firm chair that has side arms. You can use this for support while you get dressed. Do not have throw rugs and other things on the floor that can make you trip. What can I do in the kitchen? Clean up any spills right away. Avoid walking on wet floors. Keep items that you use a lot in easy-to-reach places. If you need to reach something above you, use a strong step stool that has a grab bar. Keep electrical cords out of the way. Do not use floor polish or wax that makes floors slippery. If you must use wax, use non-skid floor wax. Do not have throw rugs and other things on the floor that can make you trip. What can I do with my stairs? Do not leave any items on the stairs. Make sure that there are handrails on both sides of the stairs and use them. Fix handrails that are broken or loose. Make sure  that handrails are as long as the stairways. Check any carpeting to make sure that it is firmly attached to the stairs. Fix any carpet that is loose or worn. Avoid having throw rugs at the top or bottom of the stairs. If you do have throw rugs, attach them to the floor with carpet tape. Make sure that you have a light switch at the top of the stairs and the bottom of the stairs. If you do not have them, ask someone to add them for you. What else can I do to help prevent falls? Wear shoes that: Do not have high heels. Have rubber bottoms. Are comfortable and fit you well. Are closed at the toe. Do not wear sandals. If you use a stepladder: Make sure that it is fully opened. Do not climb a closed stepladder. Make sure that both sides of the stepladder are locked into place. Ask someone to hold it for you, if possible. Clearly mark and make sure that you can see: Any grab bars or handrails. First and last  steps. Where the edge of each step is. Use tools that help you move around (mobility aids) if they are needed. These include: Canes. Walkers. Scooters. Crutches. Turn on the lights when you go into a dark area. Replace any light bulbs as soon as they burn out. Set up your furniture so you have a clear path. Avoid moving your furniture around. If any of your floors are uneven, fix them. If there are any pets around you, be aware of where they are. Review your medicines with your doctor. Some medicines can make you feel dizzy. This can increase your chance of falling. Ask your doctor what other things that you can do to help prevent falls. This information is not intended to replace advice given to you by your health care provider. Make sure you discuss any questions you have with your health care provider. Document Released: 03/13/2009 Document Revised: 10/23/2015 Document Reviewed: 06/21/2014 Elsevier Interactive Patient Education  2017 [employer].

## 2022-09-02 NOTE — Progress Notes (Addendum)
She is currently using keflex- await culture Addnd 4/5- received culture, pseudomonas.   Will need to change to cipro  Renal function normal  Called pt and was able to reach her.  Advised we need to change abx and she states understanding  She states she does not feel good-no vomiting or fever noted but she is experiencing chills.  I advised her if she is not doing okay she may need more intensive treatment and should come to the emergency room.  For the time being she declines ER care.  She notes that her daughters are keeping a close eye on her.  I sent in a prescription for Cipro to her local pharmacy and advised her to start this right away.  She does state if she is not improving she will come to the ER for further assessment  Meds ordered this encounter  Medications   ciprofloxacin (CIPRO) 500 MG tablet    Sig: Take 1 tablet (500 mg total) by mouth 2 (two) times daily for 7 days.    Dispense:  14 tablet    Refill:  0     Results for orders placed or performed in visit on 08/31/22  Urine Culture   Specimen: Urine  Result Value Ref Range   MICRO NUMBER: [id number]    SPECIMEN QUALITY: Adequate    Sample Source NOT GIVEN    STATUS: FINAL    ISOLATE 1: Pseudomonas aeruginosa (A)       Susceptibility   Pseudomonas aeruginosa - URINE CULTURE, REFLEX    CEFTAZIDIME 4 Sensitive     CEFEPIME <=1 Sensitive     CIPROFLOXACIN <=0.25 Sensitive     LEVOFLOXACIN 0.5 Sensitive     GENTAMICIN 2 Sensitive     IMIPENEM 2 Sensitive     PIP/TAZO <=4 Sensitive     TOBRAMYCIN* <=1 Sensitive      * Legend: S = Susceptible  I = Intermediate R = Resistant  NS = Not susceptible * = Not tested  NR = Not reported **NN = See antimicrobic comments   Urinalysis, Routine w reflex microscopic  Result Value Ref Range   Color, Urine Dark Yellow (A) Yellow;Lt. Yellow;Straw;Dark Yellow;Amber;Green;Red;Brown   APPearance Turbid (A) Clear;Turbid;Slightly Cloudy;Cloudy   Specific Gravity, Urine 1.020 1.000 -  1.030   pH 6.5 5.0 - 8.0   Total Protein, Urine 30 (A) Negative   Urine Glucose NEGATIVE Negative   Ketones, ur NEGATIVE Negative   Bilirubin Urine NEGATIVE Negative   Hgb urine dipstick SMALL (A) Negative   Urobilinogen, UA 1.0 0.0 - 1.0   Leukocytes,Ua LARGE (A) Negative   Nitrite POSITIVE (A) Negative   WBC, UA TNTC(>50/hpf) (A) 0-2/hpf   RBC / HPF 7-10/hpf (A) 0-2/hpf   Mucus, UA Presence of (A) None   Squamous Epithelial / HPF Few(5-10/hpf) (A) Rare(0-4/hpf)   Bacteria, UA Many(>50/hpf) (A) None

## 2022-09-03 LAB — URINE CULTURE
MICRO NUMBER:: 14771702
SPECIMEN QUALITY:: ADEQUATE

## 2022-09-03 MED ORDER — CIPROFLOXACIN HCL 500 MG PO TABS: 500.0000 mg | ORAL_TABLET | Freq: Two times a day (BID) | ORAL | 0 refills | Status: AC

## 2022-09-03 NOTE — Addendum Note (Signed)
Addended by: Abbe Amsterdam C on: 09/03/2022 12:11 PM   Modules accepted: Orders

## 2022-09-05 NOTE — Telephone Encounter (Signed)
I received urine culture on 4/5, resistant Pseudomonas bacteria.  Called patient and explained, will change over to Cipro.  Patient stated she is not feeling well, she is experiencing chills but no fever.  I recommended that she be seen at an urgent care or emergency department as the office was closed.  She declined at that time but did agree to seek care if she should be getting worse We also made a follow-up appointment for Monday afternoon Results for orders placed or performed in visit on 08/31/22  Urine Culture   Specimen: Urine  Result Value Ref Range   MICRO NUMBER: [id number]    SPECIMEN QUALITY: Adequate    Sample Source NOT GIVEN    STATUS: FINAL    ISOLATE 1: Pseudomonas aeruginosa (A)       Susceptibility   Pseudomonas aeruginosa - URINE CULTURE, REFLEX    CEFTAZIDIME 4 Sensitive     CEFEPIME <=1 Sensitive     CIPROFLOXACIN <=0.25 Sensitive     LEVOFLOXACIN 0.5 Sensitive     GENTAMICIN 2 Sensitive     IMIPENEM 2 Sensitive     PIP/TAZO <=4 Sensitive     TOBRAMYCIN* <=1 Sensitive      * Legend: S = Susceptible  I = Intermediate R = Resistant  NS = Not susceptible * = Not tested  NR = Not reported **NN = See antimicrobic comments   Urinalysis, Routine w reflex microscopic  Result Value Ref Range   Color, Urine Dark Yellow (A) Yellow;Lt. Yellow;Straw;Dark Yellow;Amber;Green;Red;Brown   APPearance Turbid (A) Clear;Turbid;Slightly Cloudy;Cloudy   Specific Gravity, Urine 1.020 1.000 - 1.030   pH 6.5 5.0 - 8.0   Total Protein, Urine 30 (A) Negative   Urine Glucose NEGATIVE Negative   Ketones, ur NEGATIVE Negative   Bilirubin Urine NEGATIVE Negative   Hgb urine dipstick SMALL (A) Negative   Urobilinogen, UA 1.0 0.0 - 1.0   Leukocytes,Ua LARGE (A) Negative   Nitrite POSITIVE (A) Negative   WBC, UA TNTC(>50/hpf) (A) 0-2/hpf   RBC / HPF 7-10/hpf (A) 0-2/hpf   Mucus, UA Presence of (A) None   Squamous Epithelial / HPF Few(5-10/hpf) (A) Rare(0-4/hpf)   Bacteria, UA  Many(>50/hpf) (A) None

## 2022-09-05 NOTE — Progress Notes (Unsigned)
[facility] [address], [address] [place], [place] [address] [phone]  Date:  09/06/2022   Name:  Crystal Lee   DOB:  10/13/1933   MRN:  [id number]  PCP:  Pearline Cables, MD    Chief Complaint: worsening LUQ pain (Pt says the pain is ongoing. She says the pain makes her have to bend over when she walks. Urine collected on 08/31/22/Sees Urogyn)   History of Present Illness:  Crystal Lee is a 89 y.o. very pleasant female patient who presents with the following:  Patient seen today with concern for abdominal pain-  history of hypertension, hyperlipidemia, sleep apnea, breast cancer and colon cancer presently in remission, mild dementia and likely due to Alzheimer's disease per neurology  Most recent visit with myself was in February of this year  She was seen by general surgery at [facility] on March 26 to follow-up from her history of colon cancer and ventral incisional hernia She had a lap assisted partial colectomy January 2022 as well as chemotherapy.  She had follow-up repair of incisional hernia in April 2023 Dr Magnus Ivan ordered a CT abdomen pelvis for follow-up-I do not see this report as of yet- this will be done in early May   Crystal Lee called in on April 2 with concern of dysuria, we collected a urine culture I started her on Keflex. However, on April 5 her urine culture came back positive for Pseudomonas resistant to oral cephalosporins.  I called the patient and switched her over to Cipro.  We also made this appointment for today  She notes she is still having urinary frequency but this is better She still has some vaginal discharge and irritation and wonders if she might have a yeast infection as well  She is still not feeling great but does states she is feeling significantly better since we changed her antibiotic to Cipro.  However, she does also struggle with chronic back pain which bothers her especially with prolonged  standing.  She feels that she needs to bend forward to relieve her pain, she frequently has to sit down.  This has not gotten suddenly worse but has been building for years Patient Active Problem List   Diagnosis Date Noted   Memory impairment 07/12/2022   Incisional hernia 09/16/2021   Pain 05/05/2021   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 behavioral disturbance, psychotic disturbance, mood disturbance, or anxiety 05/05/2021   Stroke-like symptoms 03/25/2021   Sleep apnea    Rosacea    PONV (postoperative nausea and vomiting)    Nocturia    Neuropathy    Melanoma    Hypertension    Hyperlipidemia    Fractured hip    Difficulty sleeping    Complication of anesthesia    Cancer    Arthritis    Malignant neoplasm of descending colon    Gastric polyps    Colonic mass    Diverticulosis of colon without hemorrhage    Adenomatous polyp of sigmoid colon    Acute lower GI bleeding 06/09/2020   Lower GI bleed 06/08/2020   Acute blood loss anemia 06/08/2020   HTN (hypertension) 06/08/2020   Constipation    Encounter for orthopedic follow-up care 06/18/2019   Urinary tract infection 04/20/2019   Acute pain of right wrist 03/28/2019   Tenosynovitis, wrist 03/28/2019   Acquired trigger finger of right little finger 01/15/2019   Carpal tunnel syndrome of right wrist 01/15/2019   Aftercare 08/17/2018  Lumbar post-laminectomy syndrome 07/31/2018   Heartburn    Fracture of superior pubic ramus 06/16/2018   Radial styloid tenosynovitis of left hand 05/16/2018   Pain of left hand 04/20/2018   Osteopenia 04/15/2018   Dyslipidemia 02/02/2018   GERD (gastroesophageal reflux disease) 02/02/2018   Trochanteric bursitis of left hip 12/23/2017   Primary osteoarthritis of left knee 10/17/2017   History of right knee joint replacement 10/17/2017   Pain in both lower extremities 08/02/2017   Stage 1 breast cancer, ER+, right 07/04/2017   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   Pain in right hand  06/10/2017   Trigger finger of right hand 06/10/2017   Paresthesia 02/09/2017   Low back pain 02/09/2017   Gait abnormality 02/09/2017   OA (osteoarthritis) of hip 05/09/2015    Past Medical History:  Diagnosis Date   Arthritis    Cancer    HX BREAST CANCER/ SKIN CANCER   Colon cancer    Complication of anesthesia    N/V WITH MORPHINE   Difficulty sleeping    Fractured hip    LEFT - AUG 2016   GERD (gastroesophageal reflux disease)    Hyperlipidemia    Hypertension    Melanoma    Neuropathy    Nocturia    Osteopenia    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   PONV (postoperative nausea and vomiting)    PT STATES MORPHINE CAUSED N/V   Rosacea    Sleep apnea    Stage 1 breast cancer, ER+, right 07/04/2017    Past Surgical History:  Procedure Laterality Date   61 HOUR PH STUDY N/A 06/21/2018   Procedure: 24 HOUR PH STUDY;  Surgeon: Shellia Cleverly, DO;  Location: [facility];  Service: Gastroenterology;  Laterality: N/A;  with impedance   ABDOMINAL HYSTERECTOMY  2010   ANAL RECTAL MANOMETRY N/A 09/19/2019   Procedure: ANO RECTAL MANOMETRY;  Surgeon: Napoleon Form, MD;  Location: [facility];  Service: Endoscopy;  Laterality: N/A;   APPENDECTOMY  1949   BACK SURGERY  1973   BIOPSY  06/10/2020   Procedure: BIOPSY;  Surgeon: Shellia Cleverly, DO;  Location: [facility];  Service: Gastroenterology;;  EGD and COLON   BREAST SURGERY     CATARACT EXTRACTION Bilateral 2011   CHOLECYSTECTOMY     COLON RESECTION N/A 06/12/2020   Procedure: LAPAROSCOPIC COLON RESECTION;  Surgeon: Abigail Miyamoto, MD;  Location: [facility];  Service: General;  Laterality: N/A;   COLONOSCOPY WITH PROPOFOL N/A 06/10/2020   Procedure: COLONOSCOPY WITH PROPOFOL;  Surgeon: Shellia Cleverly, DO;  Location: [facility];  Service: Gastroenterology;  Laterality: N/A;   ESOPHAGEAL MANOMETRY N/A 06/21/2018   Procedure: ESOPHAGEAL MANOMETRY (EM);  Surgeon: Shellia Cleverly, DO;   Location: [facility];  Service: Gastroenterology;  Laterality: N/A;   ESOPHAGOGASTRODUODENOSCOPY (EGD) WITH PROPOFOL N/A 06/10/2020   Procedure: ESOPHAGOGASTRODUODENOSCOPY (EGD) WITH PROPOFOL;  Surgeon: Shellia Cleverly, DO;  Location: [facility];  Service: Gastroenterology;  Laterality: N/A;   GALLBLADDER SURGERY  2015   INCISIONAL HERNIA REPAIR N/A 09/16/2021   Procedure: OPEN INCISIONAL HERNIA REPAIR WITH MESH;  Surgeon: Abigail Miyamoto, MD;  Location: [facility];  Service: General;  Laterality: N/A;   JOINT REPLACEMENT     RT TOTAL HIP / RT TOTAL KNEE   MASTECTOMY  1998   BILATERAL    PH IMPEDANCE STUDY  06/21/2018   Procedure: PH IMPEDANCE STUDY;  Surgeon: Shellia Cleverly, DO;  Location: [facility];  Service: Gastroenterology;;  POLYPECTOMY  06/10/2020   Procedure: POLYPECTOMY;  Surgeon: Shellia Cleverly, DO;  Location: [facility];  Service: Gastroenterology;;   SKIN CANCER EXCISION  2016   RT SIDE OF NOSE   SUBMUCOSAL TATTOO INJECTION  06/10/2020   Procedure: SUBMUCOSAL TATTOO INJECTION;  Surgeon: Shellia Cleverly, DO;  Location: [facility];  Service: Gastroenterology;;   TONSILLECTOMY  1953   TOTAL HIP ARTHROPLASTY  2010   RIGHT   TOTAL HIP ARTHROPLASTY Left 05/09/2015   Procedure: LEFT TOTAL HIP ARTHROPLASTY ANTERIOR APPROACH;  Surgeon: Ollen Gross, MD;  Location: [facility];  Service: Orthopedics;  Laterality: Left;   TOTAL KNEE ARTHROPLASTY  2001   TUMOR REMOVAL  2012   ABDOMINAL - NON CANCEROUS    Social History   Tobacco Use   Smoking status: Former    Packs/day: 0.50    Years: 20.00    Additional pack years: 0.00    Total pack years: 10.00    Types: Cigarettes    Quit date: 02/03/1976    Years since quitting: 46.6   Smokeless tobacco: Never  Vaping Use   Vaping Use: Never used  Substance Use Topics   Alcohol use: No   Drug use: No    Family History  Problem Relation Age of Onset   Other Mother        complications from flu   Other Father         unsure of cause   Colon cancer Neg Hx     Allergies  Allergen Reactions   Morphine Nausea And Vomiting   Propoxyphene Nausea And Vomiting, Palpitations and Other (See Comments)    Darvocet Causes Sweats   Donepezil Diarrhea and Other (See Comments)   Morphine And Related Nausea And Vomiting   Tape Rash and Other (See Comments)    Medication list has been reviewed and updated.  Current Outpatient Medications on File Prior to Visit  Medication Sig Dispense Refill   aspirin EC 81 MG tablet Take 81 mg by mouth daily. Swallow whole.     atorvastatin (LIPITOR) 20 MG tablet Take 1 tablet (20 mg total) by mouth daily. 90 tablet 3   bacitracin ophthalmic ointment Apply to eyelids at bedtime as needed. 3.5 g 99   Cholecalciferol (VITAMIN D3) 1.25 MG (50000 UT) CAPS Take 1 weekly 12 capsule 3   ciprofloxacin (CIPRO) 500 MG tablet Take 1 tablet (500 mg total) by mouth 2 (two) times daily for 7 days. 14 tablet 0   conjugated estrogens (PREMARIN) vaginal cream Place 1 Applicatorful vaginally 2 (two) times a week. Place 0.5g nightly twice a week 90 g 3   Ergocalciferol (VITAMIN D2 PO)      estradiol (ESTRACE) 0.1 MG/GM vaginal cream Place 0.5 g vaginally 2 (two) times a week as directed 30 g 11   HYDROcodone-acetaminophen (NORCO) 10-325 MG tablet Take 1 tablet by mouth every 6 (six) hours as needed. (3/28) 120 tablet 0   ipratropium (ATROVENT) 0.06 % nasal spray Place 2 sprays into both nostrils 3 (three) times daily. 15 mL 5   losartan (COZAAR) 25 MG tablet Take 1 tablet (25 mg total) by mouth daily. 90 tablet 3   memantine (NAMENDA) 5 MG tablet Take 1 tablet (5 mg total) by mouth at bedtime. 30 tablet 11   methenamine (HIPREX) 1 g tablet Take 1 tablet (1 g total) by mouth 2 (two) times daily with a meal. 60 tablet 11   mirabegron ER (MYRBETRIQ) 50 MG TB24 tablet Take 1 tablet (  50 mg total) by mouth daily. 90 tablet 3   Multiple Vitamins-Minerals (MULTIVITAMIN ADULTS) TABS Take 1 tablet by mouth  daily.     phenazopyridine (PYRIDIUM) 200 MG tablet Take 1 tablet (200 mg total) by mouth 3 (three) times daily as needed for pain. 30 tablet 3   valACYclovir (VALTREX) 1000 MG tablet Take 1 tablet (1,000 mg total) by mouth 2 (two) times daily. 60 tablet 2   vitamin B-12 (CYANOCOBALAMIN) 1000 MCG tablet Take 1,000 mcg by mouth daily.     No current facility-administered medications on file prior to visit.    Review of Systems:  As per HPI- otherwise negative.   Physical Examination: Vitals:   09/06/22 1455  BP: 124/72  Pulse: 71  Resp: 18  Temp: 97.8 F (36.6 C)  SpO2: 97%   Vitals:   09/06/22 1455  Weight: 213 lb 12.8 oz (97 kg)  Height: 5\' 6"  (1.676 m)   Body mass index is 34.51 kg/m. Ideal Body Weight: Weight in (lb) to have BMI = 25: 154.6  GEN: no acute distress.  Mildly obese, looks well HEENT: Atraumatic, Normocephalic.  Ears and Nose: No external deformity. CV: RRR, No M/G/R. No JVD. No thrill. No extra heart sounds. PULM: CTA B, no wheezes, crackles, rhonchi. No retractions. No resp. distress. No accessory muscle use. ABD: S, NT, ND, +BS. No rebound. No HSM.  Belly is benign EXTR: No c/c/e PSYCH: Normally interactive. Conversant.  Mild kyphosis  Assessment and Plan: Vaginal discharge - Plan: fluconazole (DIFLUCAN) 150 MG tablet  Dysuria - Plan: Urine Culture  Urinary frequency - Plan: CBC, Comprehensive metabolic panel  Vitamin D deficiency - Plan: VITAMIN D 25 Hydroxy (Vit-D Deficiency, Fractures)  Mixed hyperlipidemia - Plan: atorvastatin (LIPITOR) 20 MG tablet  Essential hypertension - Plan: losartan (COZAAR) 25 MG tablet  Chronic low back pain without sciatica, unspecified back pain laterality - Plan: DG Lumbar Spine Complete, DG Thoracic Spine 2 View  Patient seen today for follow-up.  She is recently taken 2 different antibiotics, she has noted vaginal discharge and itching.  Will cover with fluconazole for yeast infection Blood pressure under  good control, refilled losartan Other labs are pending as above.  Will reculture urine to make sure infection is cleared She notes chronic back pain, she would like to know "exactly" what is causing her pain, she is interested in doing MRI.  I advised we will need to get plain films first before insurance will pay for an MRI.  Will go ahead with plain lumbar and thoracic films today  Signed Abbe Amsterdam, MD  Addendum 4/9, received her labs and x-rays Called patient to go over her results Labs are normal except for minimal elevation of liver test, we have seen this before.  Liver ultrasound last year was normal We have not yet run hepatitis screening for her We discussed her x-rays, she does have chronic compression deformity in the lumbar spine.  She does want to pursue an MRI for further evaluation and to see if any potential treatment may be available Will order MRI and send her a lab letter as well Results for orders placed or performed in visit on 09/06/22  CBC  Result Value Ref Range   WBC 6.8 4.0 - 10.5 K/uL   RBC 4.36 3.87 - 5.11 Mil/uL   Platelets 157.0 150.0 - 400.0 K/uL   Hemoglobin 13.0 12.0 - 15.0 g/dL   HCT 07.6 22.6 - 33.3 %   MCV 89.7 78.0 - 100.0 fl  MCHC 33.3 30.0 - 36.0 g/dL   RDW 21.314.1 08.611.5 - 57.815.5 %  Comprehensive metabolic panel  Result Value Ref Range   Sodium 138 135 - 145 mEq/L   Potassium 4.2 3.5 - 5.1 mEq/L   Chloride 102 96 - 112 mEq/L   CO2 27 19 - 32 mEq/L   Glucose, Bld 88 70 - 99 mg/dL   BUN 13 6 - 23 mg/dL   Creatinine, Ser 4.690.59 0.40 - 1.20 mg/dL   Total Bilirubin 0.5 0.2 - 1.2 mg/dL   Alkaline Phosphatase 133 (H) 39 - 117 U/L   AST 44 (H) 0 - 37 U/L   ALT 43 (H) 0 - 35 U/L   Total Protein 6.9 6.0 - 8.3 g/dL   Albumin 4.0 3.5 - 5.2 g/dL   GFR 62.9581.33 >28.41>60.00 mL/min   Calcium 9.2 8.4 - 10.5 mg/dL  VITAMIN D 25 Hydroxy (Vit-D Deficiency, Fractures)  Result Value Ref Range   VITD 55.08 30.00 - 100.00 ng/mL    DG Lumbar Spine Complete  Result  Date: 09/07/2022 CLINICAL DATA:  Lower back pain EXAM: THORACIC SPINE 2 VIEWS; LUMBAR SPINE - COMPLETE 4 VIEW COMPARISON:  Lumbar spine MRI dated November 23, 2019 FINDINGS: No evidence of thoracic spine fracture. Alignment is normal. Diffuse demineralization. No other significant bone abnormalities are identified. Vascular calcifications. No evidence of acute lumbar spine fracture. Mild compression deformities of L2, L3 and L4 with L3 kyphoplasty changes, unchanged when compared with prior lumbar spine MRI. Moderate multilevel degenerative disc disease and mild facet arthropathy. Alignment is normal. Diffuse demineralization. No other significant bone abnormalities are identified. Cholecystectomy clips. Vascular calcifications. IMPRESSION: 1. No acute displaced fracture or traumatic listhesis of the thoracic or lumbar spine. 2. Chronic L2, L3, and L4 compression deformities. Electronically Signed   By: Allegra LaiLeah  Strickland M.D.   On: 09/07/2022 13:26   DG Thoracic Spine 2 View  Result Date: 09/07/2022 CLINICAL DATA:  Lower back pain EXAM: THORACIC SPINE 2 VIEWS; LUMBAR SPINE - COMPLETE 4 VIEW COMPARISON:  Lumbar spine MRI dated November 23, 2019 FINDINGS: No evidence of thoracic spine fracture. Alignment is normal. Diffuse demineralization. No other significant bone abnormalities are identified. Vascular calcifications. No evidence of acute lumbar spine fracture. Mild compression deformities of L2, L3 and L4 with L3 kyphoplasty changes, unchanged when compared with prior lumbar spine MRI. Moderate multilevel degenerative disc disease and mild facet arthropathy. Alignment is normal. Diffuse demineralization. No other significant bone abnormalities are identified. Cholecystectomy clips. Vascular calcifications. IMPRESSION: 1. No acute displaced fracture or traumatic listhesis of the thoracic or lumbar spine. 2. Chronic L2, L3, and L4 compression deformities. Electronically Signed   By: Allegra LaiLeah  Strickland M.D.   On: 09/07/2022  13:26    Addnd 4/11 Called her and gave her negative urine cx report

## 2022-09-06 ENCOUNTER — Other Ambulatory Visit: Payer: Self-pay | Admitting: Registered Nurse

## 2022-09-06 ENCOUNTER — Ambulatory Visit: Payer: Medicare Other | Admitting: Family Medicine

## 2022-09-06 VITALS — BP 124/72 | HR 71 | Temp 97.8°F | Resp 18 | Ht 66.0 in | Wt 213.8 lb

## 2022-09-06 DIAGNOSIS — E559 Vitamin D deficiency, unspecified: Secondary | ICD-10-CM | POA: Diagnosis not present

## 2022-09-06 DIAGNOSIS — I10 Essential (primary) hypertension: Secondary | ICD-10-CM

## 2022-09-06 DIAGNOSIS — E782 Mixed hyperlipidemia: Secondary | ICD-10-CM

## 2022-09-06 DIAGNOSIS — N898 Other specified noninflammatory disorders of vagina: Secondary | ICD-10-CM

## 2022-09-06 DIAGNOSIS — M545 Low back pain, unspecified: Secondary | ICD-10-CM

## 2022-09-06 DIAGNOSIS — S32000A Wedge compression fracture of unspecified lumbar vertebra, initial encounter for closed fracture: Secondary | ICD-10-CM | POA: Diagnosis not present

## 2022-09-06 DIAGNOSIS — R300 Dysuria: Secondary | ICD-10-CM

## 2022-09-06 DIAGNOSIS — R350 Frequency of micturition: Secondary | ICD-10-CM | POA: Diagnosis not present

## 2022-09-06 DIAGNOSIS — G8929 Other chronic pain: Secondary | ICD-10-CM

## 2022-09-06 DIAGNOSIS — R7401 Elevation of levels of liver transaminase levels: Secondary | ICD-10-CM | POA: Diagnosis not present

## 2022-09-06 MED ORDER — FLUCONAZOLE 150 MG PO TABS: 150.0000 mg | ORAL_TABLET | Freq: Once | ORAL | 0 refills | Status: AC

## 2022-09-06 MED ORDER — ATORVASTATIN CALCIUM 20 MG PO TABS: 20.0000 mg | ORAL_TABLET | Freq: Every day | ORAL | 3 refills | Status: DC

## 2022-09-06 MED ORDER — LOSARTAN POTASSIUM 25 MG PO TABS: 25.0000 mg | ORAL_TABLET | Freq: Every day | ORAL | 3 refills | Status: DC

## 2022-09-06 MED ORDER — BACITRACIN 500 UNIT/GM OP OINT: TOPICAL_OINTMENT | OPHTHALMIC | 6 refills | Status: DC

## 2022-09-06 NOTE — Patient Instructions (Addendum)
Good to see you today- I will be in touch with your labs asap We will check on your urine culture again and make sure things are cleared up  I will also be in touch with your blood tests and x-rays

## 2022-09-07 ENCOUNTER — Other Ambulatory Visit: Payer: Self-pay | Admitting: Registered Nurse

## 2022-09-07 ENCOUNTER — Ambulatory Visit (HOSPITAL_BASED_OUTPATIENT_CLINIC_OR_DEPARTMENT_OTHER)
Admission: RE | Admit: 2022-09-07 | Discharge: 2022-09-07 | Disposition: A | Discharge status: Home / Self Care | Payer: Medicare Other | Source: Ambulatory Visit | Attending: Family Medicine | Admitting: Family Medicine

## 2022-09-07 DIAGNOSIS — M545 Low back pain, unspecified: Secondary | ICD-10-CM | POA: Insufficient documentation

## 2022-09-07 DIAGNOSIS — G8929 Other chronic pain: Secondary | ICD-10-CM | POA: Insufficient documentation

## 2022-09-07 DIAGNOSIS — M546 Pain in thoracic spine: Secondary | ICD-10-CM | POA: Diagnosis not present

## 2022-09-07 LAB — URINE CULTURE
MICRO NUMBER:: 14794577
Result:: NO GROWTH
SPECIMEN QUALITY:: ADEQUATE

## 2022-09-07 LAB — COMPREHENSIVE METABOLIC PANEL
ALT: 43 U/L — ABNORMAL HIGH (ref 0–35)
AST: 44 U/L — ABNORMAL HIGH (ref 0–37)
Albumin: 4 g/dL (ref 3.5–5.2)
Alkaline Phosphatase: 133 U/L — ABNORMAL HIGH (ref 39–117)
BUN: 13 mg/dL (ref 6–23)
CO2: 27 mEq/L (ref 19–32)
Calcium: 9.2 mg/dL (ref 8.4–10.5)
Chloride: 102 mEq/L (ref 96–112)
Creatinine, Ser: 0.59 mg/dL (ref 0.40–1.20)
GFR: 81.33 mL/min (ref >60.00)
Glucose, Bld: 88 mg/dL (ref 70–99)
Potassium: 4.2 mEq/L (ref 3.5–5.1)
Sodium: 138 mEq/L (ref 135–145)
Total Bilirubin: 0.5 mg/dL (ref 0.2–1.2)
Total Protein: 6.9 g/dL (ref 6.0–8.3)

## 2022-09-07 LAB — VITAMIN D 25 HYDROXY (VIT D DEFICIENCY, FRACTURES): VITD: 55.08 ng/mL (ref 30.00–100.00)

## 2022-09-07 LAB — CBC
HCT: 39.1 % (ref 36.0–46.0)
Hemoglobin: 13 g/dL (ref 12.0–15.0)
MCHC: 33.3 g/dL (ref 30.0–36.0)
MCV: 89.7 fl (ref 78.0–100.0)
Platelets: 157 10*3/uL (ref 150.0–400.0)
RBC: 4.36 Mil/uL (ref 3.87–5.11)
RDW: 14.1 % (ref 11.5–15.5)
WBC: 6.8 10*3/uL (ref 4.0–10.5)

## 2022-09-07 MED ORDER — CYCLOBENZAPRINE HCL 5 MG PO TABS: 5.0000 mg | ORAL_TABLET | Freq: Every evening | ORAL | 1 refills | Status: DC | PRN

## 2022-09-07 NOTE — Progress Notes (Signed)
error 

## 2022-09-07 NOTE — Telephone Encounter (Signed)
Call placed to Crystal Lee, She voiced no adverse effects while taking her cyclobenzaprine. She asked for refill to be sent to [facility].

## 2022-09-07 NOTE — Addendum Note (Signed)
Addended by: Abbe Amsterdam C on: 09/07/2022 02:05 PM   Modules accepted: Orders

## 2022-09-09 ENCOUNTER — Ambulatory Visit (HOSPITAL_COMMUNITY)
Admission: RE | Admit: 2022-09-09 | Discharge: 2022-09-09 | Disposition: A | Discharge status: Home / Self Care | Payer: Medicare Other | Source: Ambulatory Visit | Attending: Family Medicine | Admitting: Family Medicine

## 2022-09-09 DIAGNOSIS — S32000A Wedge compression fracture of unspecified lumbar vertebra, initial encounter for closed fracture: Secondary | ICD-10-CM | POA: Insufficient documentation

## 2022-09-09 DIAGNOSIS — G8929 Other chronic pain: Secondary | ICD-10-CM | POA: Insufficient documentation

## 2022-09-09 DIAGNOSIS — M5126 Other intervertebral disc displacement, lumbar region: Secondary | ICD-10-CM | POA: Diagnosis not present

## 2022-09-09 DIAGNOSIS — M545 Low back pain, unspecified: Secondary | ICD-10-CM | POA: Insufficient documentation

## 2022-09-09 DIAGNOSIS — M47816 Spondylosis without myelopathy or radiculopathy, lumbar region: Secondary | ICD-10-CM | POA: Diagnosis not present

## 2022-09-13 ENCOUNTER — Encounter: Payer: Self-pay | Admitting: Family Medicine

## 2022-09-13 ENCOUNTER — Other Ambulatory Visit: Payer: Self-pay | Admitting: Family Medicine

## 2022-09-13 ENCOUNTER — Other Ambulatory Visit (HOSPITAL_BASED_OUTPATIENT_CLINIC_OR_DEPARTMENT_OTHER): Payer: TRICARE For Life (TFL)

## 2022-09-13 ENCOUNTER — Ambulatory Visit (HOSPITAL_BASED_OUTPATIENT_CLINIC_OR_DEPARTMENT_OTHER)
Admission: RE | Admit: 2022-09-13 | Discharge: 2022-09-13 | Disposition: A | Discharge status: Home / Self Care | Payer: Medicare Other | Source: Ambulatory Visit | Attending: Family Medicine | Admitting: Family Medicine

## 2022-09-13 DIAGNOSIS — Z780 Asymptomatic menopausal state: Secondary | ICD-10-CM

## 2022-09-13 DIAGNOSIS — M810 Age-related osteoporosis without current pathological fracture: Secondary | ICD-10-CM | POA: Diagnosis not present

## 2022-09-13 DIAGNOSIS — M545 Low back pain, unspecified: Secondary | ICD-10-CM

## 2022-09-13 MED ORDER — LIDOCAINE 4 % EX PTCH
1.0000 | MEDICATED_PATCH | CUTANEOUS | 3 refills | Status: DC
Start: 2022-09-13 — End: 2023-01-19

## 2022-09-13 NOTE — Progress Notes (Signed)
Called patient to go over her recent lumbar MRI which was compared to imaging from 2021.  Advise no major changes in degenerative spine disease.  She does have remote compression fractures  IMPRESSION: No change in the appearance of the lumbar spine since the prior examination.   Mild multilevel degenerative disease without central canal or foraminal stenosis.   Remote L2, L3 and L4 compression fractures.   ------------------------- She notes she is already in pain management and using hydrocodone  Prescribed lidocaine patches for her to use as needed to augment her pain program

## 2022-09-23 ENCOUNTER — Encounter: Payer: Self-pay | Admitting: Registered Nurse

## 2022-09-23 ENCOUNTER — Other Ambulatory Visit (HOSPITAL_COMMUNITY): Payer: Self-pay

## 2022-09-23 ENCOUNTER — Encounter: Payer: Medicare Other | Attending: Physical Medicine & Rehabilitation | Admitting: Registered Nurse

## 2022-09-23 ENCOUNTER — Other Ambulatory Visit: Payer: Self-pay

## 2022-09-23 VITALS — BP 135/70 | HR 80 | Ht 66.0 in | Wt 213.0 lb

## 2022-09-23 DIAGNOSIS — G894 Chronic pain syndrome: Secondary | ICD-10-CM | POA: Diagnosis not present

## 2022-09-23 DIAGNOSIS — S32000S Wedge compression fracture of unspecified lumbar vertebra, sequela: Secondary | ICD-10-CM

## 2022-09-23 DIAGNOSIS — Z5181 Encounter for therapeutic drug level monitoring: Secondary | ICD-10-CM | POA: Diagnosis not present

## 2022-09-23 DIAGNOSIS — Z79891 Long term (current) use of opiate analgesic: Secondary | ICD-10-CM | POA: Diagnosis not present

## 2022-09-23 DIAGNOSIS — R202 Paresthesia of skin: Secondary | ICD-10-CM | POA: Diagnosis not present

## 2022-09-23 DIAGNOSIS — M2550 Pain in unspecified joint: Secondary | ICD-10-CM | POA: Insufficient documentation

## 2022-09-23 DIAGNOSIS — M170 Bilateral primary osteoarthritis of knee: Secondary | ICD-10-CM | POA: Diagnosis not present

## 2022-09-23 DIAGNOSIS — M7062 Trochanteric bursitis, left hip: Secondary | ICD-10-CM | POA: Diagnosis not present

## 2022-09-23 DIAGNOSIS — M7061 Trochanteric bursitis, right hip: Secondary | ICD-10-CM | POA: Insufficient documentation

## 2022-09-23 DIAGNOSIS — M961 Postlaminectomy syndrome, not elsewhere classified: Secondary | ICD-10-CM | POA: Insufficient documentation

## 2022-09-23 MED ORDER — HYDROCODONE-ACETAMINOPHEN 10-325 MG PO TABS
1.0000 | ORAL_TABLET | Freq: Four times a day (QID) | ORAL | 0 refills | Status: DC | PRN
  Filled 2022-09-23: qty 120, 30d supply, fill #0

## 2022-09-23 NOTE — Progress Notes (Signed)
Subjective:    Patient ID: Crystal Lee, female    DOB: March 06, 1934, 89 y.o.   MRN: [id number]  HPI: Crystal Lee is a 89 y.o. female who returns for follow up appointment for chronic pain and medication refill. She states her pain is located in her lower back, bilateral hips, bilateral lower extremities with tingling and burning and bilateral knee pain. She rates her pain 5. Her current exercise regime is walking and performing stretching exercises.  Crystal Lee is 40.00 MME.   Last Oral Swab was Performed on 07/05/2022, it was consistent.   Pain Inventory Average Pain 5 Pain Right Now 5 My pain is constant, sharp, burning, stabbing, tingling, and aching  In the last 24 hours, has pain interfered with the following? General activity 3 Relation with others 5 Enjoyment of life 5 What TIME of day is your pain at its worst? evening and night Sleep (in general) Fair  Pain is worse with: walking, bending, and standing Pain improves with: rest, medication, and heat Relief from Meds: 3  Family History  Problem Relation Age of Onset   Other Mother        complications from flu   Other Father        unsure of cause   Colon cancer Neg Hx    Social History   Socioeconomic History   Marital status: Widowed    Spouse name: Not on file   Number of children: 3   Years of education: 16 years   Highest education level: Not on file  Occupational History   Occupation: Retired  Tobacco Use   Smoking status: Former    Packs/day: 0.50    Years: 20.00    Additional pack years: 0.00    Total pack years: 10.00    Types: Cigarettes    Quit date: 02/03/1976    Years since quitting: 46.6   Smokeless tobacco: Never  Vaping Use   Vaping Use: Never used  Substance and Sexual Activity   Alcohol use: No   Drug use: No   Sexual activity: Not Currently    Birth control/protection: Post-menopausal  Other Topics Concern   Not on file  Social History Narrative   Lives at  home with husband.   Right-handed.   No caffeine use.   Social Determinants of Health   Financial Resource Strain: Low Risk  (08/07/2021)   Overall Financial Resource Strain (CARDIA)    Difficulty of Paying Living Expenses: Not hard at all  Food Insecurity: No Food Insecurity (08/31/2022)   Hunger Vital Sign    Worried About Running Out of Food in the Last Year: Never true    Ran Out of Food in the Last Year: Never true  Transportation Needs: No Transportation Needs (08/31/2022)   PRAPARE - Administrator, Civil Service (Medical): No    Lack of Transportation (Non-Medical): No  Physical Activity: Inactive (08/31/2022)   Exercise Vital Sign    Days of Exercise per Week: 0 days    Minutes of Exercise per Session: 0 min  Stress: No Stress Concern Present (08/31/2022)   [name] of Occupational Health - Occupational Stress Questionnaire    Feeling of Stress : Not at all  Social Connections: Moderately Isolated (08/07/2021)   Social Connection and Isolation Panel [NHANES]    Frequency of Communication with Friends and Family: More than three times a week    Frequency of Social Gatherings with Friends and Family: More than three times a  week    Attends Religious Services: More than 4 times per year    Active Member of Clubs or Organizations: No    Attends Banker Meetings: Never    Marital Status: Widowed   Past Surgical History:  Procedure Laterality Date   69 HOUR PH STUDY N/A 06/21/2018   Procedure: 24 HOUR PH STUDY;  Surgeon: Shellia Cleverly, DO;  Location: [facility];  Service: Gastroenterology;  Laterality: N/A;  with impedance   ABDOMINAL HYSTERECTOMY  2010   ANAL RECTAL MANOMETRY N/A 09/19/2019   Procedure: ANO RECTAL MANOMETRY;  Surgeon: Napoleon Form, MD;  Location: [facility];  Service: Endoscopy;  Laterality: N/A;   APPENDECTOMY  1949   BACK SURGERY  1973   BIOPSY  06/10/2020   Procedure: BIOPSY;  Surgeon: Shellia Cleverly, DO;   Location: [facility];  Service: Gastroenterology;;  EGD and COLON   BREAST SURGERY     CATARACT EXTRACTION Bilateral 2011   CHOLECYSTECTOMY     COLON RESECTION N/A 06/12/2020   Procedure: LAPAROSCOPIC COLON RESECTION;  Surgeon: Abigail Miyamoto, MD;  Location: [facility];  Service: General;  Laterality: N/A;   COLONOSCOPY WITH PROPOFOL N/A 06/10/2020   Procedure: COLONOSCOPY WITH PROPOFOL;  Surgeon: Shellia Cleverly, DO;  Location: [facility];  Service: Gastroenterology;  Laterality: N/A;   ESOPHAGEAL MANOMETRY N/A 06/21/2018   Procedure: ESOPHAGEAL MANOMETRY (EM);  Surgeon: Shellia Cleverly, DO;  Location: [facility];  Service: Gastroenterology;  Laterality: N/A;   ESOPHAGOGASTRODUODENOSCOPY (EGD) WITH PROPOFOL N/A 06/10/2020   Procedure: ESOPHAGOGASTRODUODENOSCOPY (EGD) WITH PROPOFOL;  Surgeon: Shellia Cleverly, DO;  Location: [facility];  Service: Gastroenterology;  Laterality: N/A;   GALLBLADDER SURGERY  2015   INCISIONAL HERNIA REPAIR N/A 09/16/2021   Procedure: OPEN INCISIONAL HERNIA REPAIR WITH MESH;  Surgeon: Abigail Miyamoto, MD;  Location: [facility];  Service: General;  Laterality: N/A;   JOINT REPLACEMENT     RT TOTAL HIP / RT TOTAL KNEE   MASTECTOMY  1998   BILATERAL    PH IMPEDANCE STUDY  06/21/2018   Procedure: PH IMPEDANCE STUDY;  Surgeon: Shellia Cleverly, DO;  Location: [facility];  Service: Gastroenterology;;   POLYPECTOMY  06/10/2020   Procedure: POLYPECTOMY;  Surgeon: Shellia Cleverly, DO;  Location: [facility];  Service: Gastroenterology;;   SKIN CANCER EXCISION  2016   RT SIDE OF NOSE   SUBMUCOSAL TATTOO INJECTION  06/10/2020   Procedure: SUBMUCOSAL TATTOO INJECTION;  Surgeon: Shellia Cleverly, DO;  Location: [facility];  Service: Gastroenterology;;   TONSILLECTOMY  1953   TOTAL HIP ARTHROPLASTY  2010   RIGHT   TOTAL HIP ARTHROPLASTY Left 05/09/2015   Procedure: LEFT TOTAL HIP ARTHROPLASTY ANTERIOR APPROACH;  Surgeon: Ollen Gross, MD;  Location: [facility];  Service: Orthopedics;  Laterality: Left;   TOTAL KNEE ARTHROPLASTY  2001   TUMOR REMOVAL  2012   ABDOMINAL - NON CANCEROUS   Past Surgical History:  Procedure Laterality Date   54 HOUR PH STUDY N/A 06/21/2018   Procedure: 24 HOUR PH STUDY;  Surgeon: Shellia Cleverly, DO;  Location: [facility];  Service: Gastroenterology;  Laterality: N/A;  with impedance   ABDOMINAL HYSTERECTOMY  2010   ANAL RECTAL MANOMETRY N/A 09/19/2019   Procedure: ANO RECTAL MANOMETRY;  Surgeon: Napoleon Form, MD;  Location: [facility];  Service: Endoscopy;  Laterality: N/A;   APPENDECTOMY  1949   BACK SURGERY  1973   BIOPSY  06/10/2020   Procedure: BIOPSY;  Surgeon:  Cirigliano, Vito V, DO;  Location: [facility];  Service: Gastroenterology;;  EGD and COLON   BREAST SURGERY     CATARACT EXTRACTION Bilateral 2011   CHOLECYSTECTOMY     COLON RESECTION N/A 06/12/2020   Procedure: LAPAROSCOPIC COLON RESECTION;  Surgeon: Abigail Miyamoto, MD;  Location: [facility];  Service: General;  Laterality: N/A;   COLONOSCOPY WITH PROPOFOL N/A 06/10/2020   Procedure: COLONOSCOPY WITH PROPOFOL;  Surgeon: Shellia Cleverly, DO;  Location: [facility];  Service: Gastroenterology;  Laterality: N/A;   ESOPHAGEAL MANOMETRY N/A 06/21/2018   Procedure: ESOPHAGEAL MANOMETRY (EM);  Surgeon: Shellia Cleverly, DO;  Location: [facility];  Service: Gastroenterology;  Laterality: N/A;   ESOPHAGOGASTRODUODENOSCOPY (EGD) WITH PROPOFOL N/A 06/10/2020   Procedure: ESOPHAGOGASTRODUODENOSCOPY (EGD) WITH PROPOFOL;  Surgeon: Shellia Cleverly, DO;  Location: [facility];  Service: Gastroenterology;  Laterality: N/A;   GALLBLADDER SURGERY  2015   INCISIONAL HERNIA REPAIR N/A 09/16/2021   Procedure: OPEN INCISIONAL HERNIA REPAIR WITH MESH;  Surgeon: Abigail Miyamoto, MD;  Location: [facility];  Service: General;  Laterality: N/A;   JOINT REPLACEMENT     RT TOTAL HIP / RT TOTAL KNEE   MASTECTOMY  1998   BILATERAL    PH IMPEDANCE STUDY   06/21/2018   Procedure: PH IMPEDANCE STUDY;  Surgeon: Shellia Cleverly, DO;  Location: [facility];  Service: Gastroenterology;;   POLYPECTOMY  06/10/2020   Procedure: POLYPECTOMY;  Surgeon: Shellia Cleverly, DO;  Location: [facility];  Service: Gastroenterology;;   SKIN CANCER EXCISION  2016   RT SIDE OF NOSE   SUBMUCOSAL TATTOO INJECTION  06/10/2020   Procedure: SUBMUCOSAL TATTOO INJECTION;  Surgeon: Shellia Cleverly, DO;  Location: [facility];  Service: Gastroenterology;;   TONSILLECTOMY  1953   TOTAL HIP ARTHROPLASTY  2010   RIGHT   TOTAL HIP ARTHROPLASTY Left 05/09/2015   Procedure: LEFT TOTAL HIP ARTHROPLASTY ANTERIOR APPROACH;  Surgeon: Ollen Gross, MD;  Location: [facility];  Service: Orthopedics;  Laterality: Left;   TOTAL KNEE ARTHROPLASTY  2001   TUMOR REMOVAL  2012   ABDOMINAL - NON CANCEROUS   Past Medical History:  Diagnosis Date   Arthritis    Cancer    HX BREAST CANCER/ SKIN CANCER   Colon cancer    Complication of anesthesia    N/V WITH Lee   Difficulty sleeping    Fractured hip    LEFT - AUG 2016   GERD (gastroesophageal reflux disease)    Hyperlipidemia    Hypertension    Melanoma    Neuropathy    Nocturia    Osteopenia    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  Opioid Risk Score:   Fall Risk Score:  `1  Depression screen PHQ 2/9     09/06/2022    3:07 PM 08/31/2022    1:08 PM 08/16/2022   11:13 AM 07/05/2022   11:17 AM 05/25/2022   11:39 AM 04/05/2022    1:03 PM 01/12/2022    1:42 PM  Depression screen PHQ 2/9  Decreased Interest 0 0 0 0 0 0 0  Down, Depressed, Hopeless 0 0 0 0 0 0 0  PHQ - 2 Score 0 0 0 0 0 0 0    Review of  Systems  Musculoskeletal:  Positive for back pain and gait problem.       Pain in both legs & feer  All  other systems reviewed and are negative.     Objective:   Physical Exam Vitals and nursing note reviewed.  Constitutional:      Appearance: Normal appearance.  Cardiovascular:     Rate and Rhythm: Normal rate and regular rhythm.     Pulses: Normal pulses.     Heart sounds: Normal heart sounds.  Pulmonary:     Effort: Pulmonary effort is normal.     Breath sounds: Normal breath sounds.  Musculoskeletal:     Cervical back: Normal range of motion and neck supple.     Comments: Normal Muscle Bulk and Muscle Testing Reveals:  Upper Extremities: Full ROM and Muscle Strength  5/5 Lumbar Paraspinal Tenderness: L-3-L-5 Bilateral Greater Trochanter Tenderness Lower Extremities: Full ROM and Muscle Strength 5/5 Arises from Chair slowly using walker for support Narrow based  Gait     Skin:    General: Skin is dry.  Neurological:     Mental Status: She is alert and oriented to person, place, and time.  Psychiatric:        Mood and Affect: Mood normal.        Behavior: Behavior normal.         Assessment & Plan:  1. Chronic Bilateral Leg pain: Paresthesia: Continue to Monitor. 09/23/2022 2. Paresthesia Salli Real Radiculitis: Crystal Lee has weaned herself off the Lyrica due to daytime drowsiness. We will continue to monitor. 09/23/2022 3. Pain of Left Wrist/ : No complaints today. S/P Carpal Tunnel Release on 06/03/2017 by Dr. Danielle Dess. Dr. Danielle Dess Following. 09/23/2022. 4. Fracture of superior pubic ramus.  Dr. Lequita Halt Following.  Continue to monitor. 09/23/2022. 5. Bilateral Knee OA: Continue Voltaren Gel.  Continue to monitor. Orthopedist following. 09/23/2022 6. Polyarthralgia: Continue to alternate with heat and ice therapy. Continue current medication regime. Continue to monitor. 09/23/2022. 7. Chronic Pain Syndrome: Refilled::Hydrocodone  10/325 one tablet 4 times a day as needed for pain #120.  8. Lumbar Compression Fracture L2 and L3:  Crystal Lee refused physical therapy. Continue  with rest/ heat therapy. Continue to Monitor 09/23/2022. 9. Muscle Spasm: Continue  Flexeril 5 mg at HS. Continue to monitor. 09/23/2022 10. Right Shoulder Tendonitis: No complaints today. Continue HEP as Tolerated. Alternate Ice and Heat Therapy. 09/23/2022  11. Greater Trochanter Bursitis: Continue to Monitor. Continue HEP as Tolerated. 09/23/2022   F/U in 1 Month

## 2022-09-29 ENCOUNTER — Ambulatory Visit
Admission: RE | Admit: 2022-09-29 | Discharge: 2022-09-29 | Disposition: A | Discharge status: Home / Self Care | Payer: Medicare Other | Source: Ambulatory Visit | Attending: Surgery | Admitting: Surgery

## 2022-09-29 DIAGNOSIS — R1032 Left lower quadrant pain: Secondary | ICD-10-CM | POA: Diagnosis not present

## 2022-09-29 DIAGNOSIS — Z85038 Personal history of other malignant neoplasm of large intestine: Secondary | ICD-10-CM | POA: Diagnosis not present

## 2022-09-29 DIAGNOSIS — R1012 Left upper quadrant pain: Secondary | ICD-10-CM

## 2022-09-29 MED ORDER — IOPAMIDOL (ISOVUE-300) INJECTION 61%
100.0000 mL | Freq: Once | INTRAVENOUS | Status: AC | PRN
  Administered 2022-09-29: 100 mL via INTRAVENOUS

## 2022-10-19 DIAGNOSIS — L820 Inflamed seborrheic keratosis: Secondary | ICD-10-CM | POA: Diagnosis not present

## 2022-10-19 DIAGNOSIS — L570 Actinic keratosis: Secondary | ICD-10-CM | POA: Diagnosis not present

## 2022-10-19 DIAGNOSIS — L814 Other melanin hyperpigmentation: Secondary | ICD-10-CM | POA: Diagnosis not present

## 2022-10-19 DIAGNOSIS — D225 Melanocytic nevi of trunk: Secondary | ICD-10-CM | POA: Diagnosis not present

## 2022-10-19 DIAGNOSIS — L821 Other seborrheic keratosis: Secondary | ICD-10-CM | POA: Diagnosis not present

## 2022-10-21 ENCOUNTER — Encounter: Payer: Medicare Other | Attending: Physical Medicine & Rehabilitation | Admitting: Registered Nurse

## 2022-10-21 ENCOUNTER — Encounter: Payer: Self-pay | Admitting: Registered Nurse

## 2022-10-21 ENCOUNTER — Other Ambulatory Visit: Payer: Self-pay

## 2022-10-21 ENCOUNTER — Other Ambulatory Visit (HOSPITAL_COMMUNITY): Payer: Self-pay

## 2022-10-21 VITALS — BP 135/75 | HR 76 | Ht 66.0 in | Wt 212.8 lb

## 2022-10-21 DIAGNOSIS — M7061 Trochanteric bursitis, right hip: Secondary | ICD-10-CM | POA: Insufficient documentation

## 2022-10-21 DIAGNOSIS — R202 Paresthesia of skin: Secondary | ICD-10-CM | POA: Insufficient documentation

## 2022-10-21 DIAGNOSIS — M7062 Trochanteric bursitis, left hip: Secondary | ICD-10-CM | POA: Diagnosis not present

## 2022-10-21 DIAGNOSIS — S32000S Wedge compression fracture of unspecified lumbar vertebra, sequela: Secondary | ICD-10-CM | POA: Insufficient documentation

## 2022-10-21 DIAGNOSIS — M2550 Pain in unspecified joint: Secondary | ICD-10-CM | POA: Insufficient documentation

## 2022-10-21 DIAGNOSIS — Z79891 Long term (current) use of opiate analgesic: Secondary | ICD-10-CM | POA: Diagnosis not present

## 2022-10-21 DIAGNOSIS — Z5181 Encounter for therapeutic drug level monitoring: Secondary | ICD-10-CM | POA: Diagnosis not present

## 2022-10-21 DIAGNOSIS — M170 Bilateral primary osteoarthritis of knee: Secondary | ICD-10-CM | POA: Insufficient documentation

## 2022-10-21 DIAGNOSIS — M961 Postlaminectomy syndrome, not elsewhere classified: Secondary | ICD-10-CM | POA: Diagnosis not present

## 2022-10-21 DIAGNOSIS — G894 Chronic pain syndrome: Secondary | ICD-10-CM | POA: Insufficient documentation

## 2022-10-21 MED ORDER — HYDROCODONE-ACETAMINOPHEN 10-325 MG PO TABS
1.0000 | ORAL_TABLET | Freq: Four times a day (QID) | ORAL | 0 refills | Status: DC | PRN
  Filled 2022-10-21: qty 120, 30d supply, fill #0

## 2022-10-21 NOTE — Progress Notes (Signed)
Subjective:    Patient ID: Helmut Muster, female    DOB: 1933/11/23, 89 y.o.   MRN: [id number]  HPI: Crystal Lee is a 89 y.o. female who returns for follow up appointment for chronic pain and medication refill. She states her pain is located in her lower back, bilateral hips, bilateral lower extremities with tingling, bilateral knee pain L>R and and generalized joint pain. She rates her pain 5. Her current exercise regime is walking and performing stretching exercises.  Ms. Russi reports she was cleaning her trash can, when the lid hit her in the head, she didn't seek medical attention. She denies headache or dizziness.   Ms. Arriola Morphine equivalent is 40.00 MME.   Last Oral Swab was Performed on 07/05/2022, it was consistent.    Pain Inventory Average Pain 5 Pain Right Now 5 My pain is sharp, burning, and aching  In the last 24 hours, has pain interfered with the following? General activity 5 Relation with others 5 Enjoyment of life 5 What TIME of day is your pain at its worst? varies Sleep (in general) NA  Pain is worse with: walking, bending, standing, and some activites Pain improves with: rest, heat/ice, and medication Relief from Meds: 4  Family History  Problem Relation Age of Onset   Other Mother        complications from flu   Other Father        unsure of cause   Colon cancer Neg Hx    Social History   Socioeconomic History   Marital status: Widowed    Spouse name: Not on file   Number of children: 3   Years of education: 16 years   Highest education level: Not on file  Occupational History   Occupation: Retired  Tobacco Use   Smoking status: Former    Packs/day: 0.50    Years: 20.00    Additional pack years: 0.00    Total pack years: 10.00    Types: Cigarettes    Quit date: 02/03/1976    Years since quitting: 46.7   Smokeless tobacco: Never  Vaping Use   Vaping Use: Never used  Substance and Sexual Activity   Alcohol use: No   Drug use: No    Sexual activity: Not Currently    Birth control/protection: Post-menopausal  Other Topics Concern   Not on file  Social History Narrative   Lives at home with husband.   Right-handed.   No caffeine use.   Social Determinants of Health   Financial Resource Strain: Low Risk  (08/07/2021)   Overall Financial Resource Strain (CARDIA)    Difficulty of Paying Living Expenses: Not hard at all  Food Insecurity: No Food Insecurity (08/31/2022)   Hunger Vital Sign    Worried About Running Out of Food in the Last Year: Never true    Ran Out of Food in the Last Year: Never true  Transportation Needs: No Transportation Needs (08/31/2022)   PRAPARE - Administrator, Civil Service (Medical): No    Lack of Transportation (Non-Medical): No  Physical Activity: Inactive (08/31/2022)   Exercise Vital Sign    Days of Exercise per Week: 0 days    Minutes of Exercise per Session: 0 min  Stress: No Stress Concern Present (08/31/2022)   [name] of Occupational Health - Occupational Stress Questionnaire    Feeling of Stress : Not at all  Social Connections: Moderately Isolated (08/07/2021)   Social Connection and Isolation Panel [NHANES]  Frequency of Communication with Friends and Family: More than three times a week    Frequency of Social Gatherings with Friends and Family: More than three times a week    Attends Religious Services: More than 4 times per year    Active Member of [employer] or Organizations: No    Attends Banker Meetings: Never    Marital Status: Widowed   Past Surgical History:  Procedure Laterality Date   57 HOUR PH STUDY N/A 06/21/2018   Procedure: 24 HOUR PH STUDY;  Surgeon: Shellia Cleverly, DO;  Location: [facility];  Service: Gastroenterology;  Laterality: N/A;  with impedance   ABDOMINAL HYSTERECTOMY  2010   ANAL RECTAL MANOMETRY N/A 09/19/2019   Procedure: ANO RECTAL MANOMETRY;  Surgeon: Napoleon Form, MD;  Location: [facility];  Service:  Endoscopy;  Laterality: N/A;   APPENDECTOMY  1949   BACK SURGERY  1973   BIOPSY  06/10/2020   Procedure: BIOPSY;  Surgeon: Shellia Cleverly, DO;  Location: [facility];  Service: Gastroenterology;;  EGD and COLON   BREAST SURGERY     CATARACT EXTRACTION Bilateral 2011   CHOLECYSTECTOMY     COLON RESECTION N/A 06/12/2020   Procedure: LAPAROSCOPIC COLON RESECTION;  Surgeon: Abigail Miyamoto, MD;  Location: [facility];  Service: General;  Laterality: N/A;   COLONOSCOPY WITH PROPOFOL N/A 06/10/2020   Procedure: COLONOSCOPY WITH PROPOFOL;  Surgeon: Shellia Cleverly, DO;  Location: [facility];  Service: Gastroenterology;  Laterality: N/A;   ESOPHAGEAL MANOMETRY N/A 06/21/2018   Procedure: ESOPHAGEAL MANOMETRY (EM);  Surgeon: Shellia Cleverly, DO;  Location: [facility];  Service: Gastroenterology;  Laterality: N/A;   ESOPHAGOGASTRODUODENOSCOPY (EGD) WITH PROPOFOL N/A 06/10/2020   Procedure: ESOPHAGOGASTRODUODENOSCOPY (EGD) WITH PROPOFOL;  Surgeon: Shellia Cleverly, DO;  Location: [facility];  Service: Gastroenterology;  Laterality: N/A;   GALLBLADDER SURGERY  2015   INCISIONAL HERNIA REPAIR N/A 09/16/2021   Procedure: OPEN INCISIONAL HERNIA REPAIR WITH MESH;  Surgeon: Abigail Miyamoto, MD;  Location: [facility];  Service: General;  Laterality: N/A;   JOINT REPLACEMENT     RT TOTAL HIP / RT TOTAL KNEE   MASTECTOMY  1998   BILATERAL    PH IMPEDANCE STUDY  06/21/2018   Procedure: PH IMPEDANCE STUDY;  Surgeon: Shellia Cleverly, DO;  Location: [facility];  Service: Gastroenterology;;   POLYPECTOMY  06/10/2020   Procedure: POLYPECTOMY;  Surgeon: Shellia Cleverly, DO;  [facility]: [facility];  Service: Gastroenterology;;   SKIN CANCER EXCISION  2016   RT SIDE OF NOSE   SUBMUCOSAL TATTOO INJECTION  06/10/2020   Procedure: SUBMUCOSAL TATTOO INJECTION;  Surgeon: Shellia Cleverly, DO;  [facility]: [facility];  Service: Gastroenterology;;   TONSILLECTOMY  1953   TOTAL HIP ARTHROPLASTY  2010    RIGHT   TOTAL HIP ARTHROPLASTY Left 05/09/2015   Procedure: LEFT TOTAL HIP ARTHROPLASTY ANTERIOR APPROACH;  Surgeon: Ollen Gross, MD;  Location: [facility];  Service: Orthopedics;  Laterality: Left;   TOTAL KNEE ARTHROPLASTY  2001   TUMOR REMOVAL  2012   ABDOMINAL - NON CANCEROUS   Past Surgical History:  Procedure Laterality Date   55 HOUR PH STUDY N/A 06/21/2018   Procedure: 24 HOUR PH STUDY;  Surgeon: Shellia Cleverly, DO;  [facility];  Service: Gastroenterology;  Laterality: N/A;  with impedance   ABDOMINAL HYSTERECTOMY  2010   ANAL RECTAL MANOMETRY N/A 09/19/2019   Procedure: ANO RECTAL MANOMETRY;  Surgeon: Napoleon Form, MD;  Location: [facility];  Service: Endoscopy;  Laterality: N/A;   APPENDECTOMY  1949   BACK SURGERY  1973   BIOPSY  06/10/2020   Procedure: BIOPSY;  Surgeon: Shellia Cleverly, DO;  [facility]: [facility];  Service: Gastroenterology;;  EGD and COLON   BREAST SURGERY     CATARACT EXTRACTION Bilateral 2011   CHOLECYSTECTOMY     COLON RESECTION N/A 06/12/2020   Procedure: LAPAROSCOPIC COLON RESECTION;  Surgeon: Abigail Miyamoto, MD;  Location: [facility];  Service: General;  Laterality: N/A;   COLONOSCOPY WITH PROPOFOL N/A 06/10/2020   Procedure: COLONOSCOPY WITH PROPOFOL;  Surgeon: Shellia Cleverly, DO;  [facility];  Service: Gastroenterology;  Laterality: N/A;   ESOPHAGEAL MANOMETRY N/A 06/21/2018   Procedure: ESOPHAGEAL MANOMETRY (EM);  Surgeon: Shellia Cleverly, DO;  [facility];  Service: Gastroenterology;  Laterality: N/A;   ESOPHAGOGASTRODUODENOSCOPY (EGD) WITH PROPOFOL N/A 06/10/2020   Procedure: ESOPHAGOGASTRODUODENOSCOPY (EGD) WITH PROPOFOL;  Surgeon: Shellia Cleverly, DO;  [facility]: [facility];  Service: Gastroenterology;  Laterality: N/A;   GALLBLADDER SURGERY  2015   INCISIONAL HERNIA REPAIR N/A 09/16/2021   Procedure: OPEN INCISIONAL HERNIA REPAIR WITH MESH;  Surgeon: Abigail Miyamoto, MD;  Location: [facility];   Service: General;  Laterality: N/A;   JOINT REPLACEMENT     RT TOTAL HIP / RT TOTAL KNEE   MASTECTOMY  1998   BILATERAL    PH IMPEDANCE STUDY  06/21/2018   Procedure: PH IMPEDANCE STUDY;  Surgeon: Shellia Cleverly, DO;  [facility]: [facility];  Service: Gastroenterology;;   POLYPECTOMY  06/10/2020   Procedure: POLYPECTOMY;  Surgeon: Shellia Cleverly, DO;  [facility]: [facility];  Service: Gastroenterology;;   SKIN CANCER EXCISION  2016   RT SIDE OF NOSE   SUBMUCOSAL TATTOO INJECTION  06/10/2020   Procedure: SUBMUCOSAL TATTOO INJECTION;  Surgeon: Shellia Cleverly, DO;  [facility];  Service: Gastroenterology;;   TONSILLECTOMY  1953   TOTAL HIP ARTHROPLASTY  2010   RIGHT   TOTAL HIP ARTHROPLASTY Left 05/09/2015   Procedure: LEFT TOTAL HIP ARTHROPLASTY ANTERIOR APPROACH;  Surgeon: Ollen Gross, MD;  Location: [facility];  Service: Orthopedics;  Laterality: Left;   TOTAL KNEE ARTHROPLASTY  2001   TUMOR REMOVAL  2012   ABDOMINAL - NON CANCEROUS   Past Medical History:  Diagnosis Date   Arthritis    Cancer (HCC)    HX BREAST CANCER/ SKIN CANCER   Colon cancer (HCC)    Complication of anesthesia    N/V WITH MORPHINE   Difficulty sleeping    Fractured hip (HCC)    LEFT - AUG 2016   GERD (gastroesophageal reflux disease)    Hyperlipidemia    Hypertension    Melanoma (HCC)    Neuropathy    Nocturia    Osteopenia    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   PONV (postoperative nausea and vomiting)    PT STATES MORPHINE CAUSED N/V   Rosacea    Sleep apnea    Stage 1 breast cancer, ER+, right (HCC) 07/04/2017   BP 135/75   Pulse 76   Ht 5\' 6"  (1.676 m)   Wt 212 lb 12.8 oz (96.5 kg)   SpO2 96%   BMI 34.35 kg/m   Opioid Risk Score:   Fall Risk Score:  `1  Depression screen [facility] 2/9     10/21/2022   11:16 AM 09/06/2022    3:07 PM 08/31/2022    1:08 PM 08/16/2022   11:13 AM 07/05/2022  11:17 AM 05/25/2022   11:39 AM 04/05/2022    1:03 PM  Depression  screen PHQ 2/9  Decreased Interest 0 0 0 0 0 0 0  Down, Depressed, Hopeless 0 0 0 0 0 0 0  PHQ - 2 Score 0 0 0 0 0 0 0    Review of Systems  Constitutional: Negative.   HENT: Negative.    Eyes: Negative.   Respiratory: Negative.    Cardiovascular: Negative.   Gastrointestinal: Negative.   Endocrine: Negative.   Genitourinary: Negative.   Musculoskeletal:  Positive for back pain.  Skin: Negative.   Allergic/Immunologic: Negative.   Neurological: Negative.   Hematological: Negative.   Psychiatric/Behavioral: Negative.    All other systems reviewed and are negative.      Objective:   Physical Exam Vitals and nursing note reviewed.  Constitutional:      Appearance: Normal appearance.  Cardiovascular:     Rate and Rhythm: Normal rate and regular rhythm.  Pulmonary:     Effort: Pulmonary effort is normal.     Breath sounds: Normal breath sounds.  Musculoskeletal:     Cervical back: Normal range of motion and neck supple.     Comments: Normal Muscle Bulk and Muscle Testing Reveals:  Upper Extremities:Full  ROM and Muscle Strength 5/5 Lumbar Paraspinal Tenderness: L-4-L-5 Lower Extremities: Decreased ROM and Muscle Strength 5/5 Bilateral Lower Extremities Flexion Produces Pain into her Left Patella  Arises from Table slowly using walker for support Narrow Based Gait     Skin:    General: Skin is warm and dry.  Neurological:     Mental Status: She is alert and oriented to person, place, and time.  Psychiatric:        Mood and Affect: Mood normal.        Behavior: Behavior normal.         Assessment & Plan:  1. Chronic Bilateral Leg pain: Paresthesia: Continue to Monitor. 10/21/2022 2. Paresthesia Salli Real Radiculitis: Ms. Lanford has weaned herself off the Lyrica due to daytime drowsiness. We will continue to monitor. 10/21/2022 3. Pain of Left Wrist/ : No complaints today. S/P Carpal Tunnel Release on 06/03/2017 by Dr. Danielle Dess. Dr. Danielle Dess Following. 09/23/2022. 4.  Fracture of superior pubic ramus.  Dr. Lequita Halt Following.  Continue to monitor. 10/21/2022. 5. Bilateral Knee OA: Continue Voltaren Gel.  Continue to monitor. Orthopedist following. 10/21/2022 6. Polyarthralgia: Continue to alternate with heat and ice therapy. Continue current medication regime. Continue to monitor. 10/21/2022. 7. Chronic Pain Syndrome: Refilled::Hydrocodone  10/325 one tablet 4 times a day as needed for pain #120.  8. Lumbar Compression Fracture L2 and L3:  Ms. Theilen refused physical therapy. Continue with rest/ heat therapy. Continue to Monitor 10/21/2022. 9. Muscle Spasm: Continue  Flexeril 5 mg at HS. Continue to monitor. 10/21/2022 10. Right Shoulder Tendonitis: No complaints today. Continue HEP as Tolerated. Alternate Ice and Heat Therapy. 10/21/2022  11. Greater Trochanter Bursitis: Continue to Monitor. Continue HEP as Tolerated. 10/21/2022   F/U in 1 Month

## 2022-11-15 ENCOUNTER — Encounter: Payer: Self-pay | Admitting: Registered Nurse

## 2022-11-15 ENCOUNTER — Encounter: Payer: Medicare Other | Attending: Physical Medicine & Rehabilitation | Admitting: Registered Nurse

## 2022-11-15 VITALS — BP 127/69 | HR 70 | Ht 66.0 in | Wt 212.6 lb

## 2022-11-15 DIAGNOSIS — G894 Chronic pain syndrome: Secondary | ICD-10-CM | POA: Insufficient documentation

## 2022-11-15 DIAGNOSIS — S32000S Wedge compression fracture of unspecified lumbar vertebra, sequela: Secondary | ICD-10-CM | POA: Insufficient documentation

## 2022-11-15 DIAGNOSIS — M170 Bilateral primary osteoarthritis of knee: Secondary | ICD-10-CM | POA: Diagnosis not present

## 2022-11-15 DIAGNOSIS — M961 Postlaminectomy syndrome, not elsewhere classified: Secondary | ICD-10-CM | POA: Diagnosis not present

## 2022-11-15 DIAGNOSIS — R202 Paresthesia of skin: Secondary | ICD-10-CM | POA: Diagnosis not present

## 2022-11-15 DIAGNOSIS — Z79891 Long term (current) use of opiate analgesic: Secondary | ICD-10-CM | POA: Insufficient documentation

## 2022-11-15 DIAGNOSIS — M2550 Pain in unspecified joint: Secondary | ICD-10-CM | POA: Insufficient documentation

## 2022-11-15 DIAGNOSIS — Z5181 Encounter for therapeutic drug level monitoring: Secondary | ICD-10-CM | POA: Insufficient documentation

## 2022-11-15 MED ORDER — HYDROCODONE-ACETAMINOPHEN 10-325 MG PO TABS
1.0000 | ORAL_TABLET | Freq: Four times a day (QID) | ORAL | 0 refills | Status: DC | PRN
  Filled 2022-11-15 – 2022-11-18 (×2): qty 120, 30d supply, fill #0

## 2022-11-15 NOTE — Patient Instructions (Signed)
Call Office when you received your hydrocodone prescription from [facility].

## 2022-11-15 NOTE — Progress Notes (Signed)
Subjective:    Patient ID: Crystal Lee, female    DOB: [birth year], 89 y.o.   MRN: [id number]  HPI: Crystal Lee is a 89 y.o. female who returns for follow up appointment for chronic pain and medication refill. She states her pain is located in her lower back, bilateral lower extremities with numbness, and bilateral knee pain. She also reports she has generalized joint pain. She rates her pain 6. Her current exercise regime is walking and performing stretching exercises.  [name] equivalent is 40.00 MME.   Last Oral Swab was Performed on 07/05/2022, it was consistent.     Pain Inventory Average Pain 6 Pain Right Now 6 My pain is sharp, stabbing, tingling, and aching  In the last 24 hours, has pain interfered with the following? General activity 7 Relation with others 4 Enjoyment of life 5 What TIME of day is your pain at its worst? evening Sleep (in general) Fair  Pain is worse with: walking, bending, sitting, standing, and some activites Pain improves with: rest, heat/ice, and medication Relief from Meds: 4  Family History  Problem Relation Age of Onset   Other Mother        complications from flu   Other Father        unsure of cause   Colon cancer Neg Hx    Social History   Socioeconomic History   Marital status: Widowed    Spouse name: Not on file   Number of children: 3   Years of education: 16 years   Highest education level: Not on file  Occupational History   Occupation: Retired  Tobacco Use   Smoking status: Former    Packs/day: 0.50    Years: 20.00    Additional pack years: 0.00    Total pack years: 10.00    Types: Cigarettes    Quit date: 02/03/1976    Years since quitting: 46.8   Smokeless tobacco: Never  Vaping Use   Vaping Use: Never used  Substance and Sexual Activity   Alcohol use: No   Drug use: No   Sexual activity: Not Currently    Birth control/protection: Post-menopausal  Other Topics Concern   Not on file  Social History  Narrative   Lives at home with husband.   Right-handed.   No caffeine use.   Social Determinants of Health   Financial Resource Strain: Low Risk  (08/07/2021)   Overall Financial Resource Strain (CARDIA)    Difficulty of Paying Living Expenses: Not hard at all  Food Insecurity: No Food Insecurity (08/31/2022)   Hunger Vital Sign    Worried About Running Out of Food in the Last Year: Never true    Ran Out of Food in the Last Year: Never true  Transportation Needs: No Transportation Needs (08/31/2022)   PRAPARE - Administrator, Civil Service (Medical): No    Lack of Transportation (Non-Medical): No  Physical Activity: Inactive (08/31/2022)   Exercise Vital Sign    Days of Exercise per Week: 0 days    Minutes of Exercise per Session: 0 min  Stress: No Stress Concern Present (08/31/2022)   [name] of Occupational Health - Occupational Stress Questionnaire    Feeling of Stress : Not at all  Social Connections: Moderately Isolated (08/07/2021)   Social Connection and Isolation Panel [NHANES]    Frequency of Communication with Friends and Family: More than three times a week    Frequency of Social Gatherings with Friends and Family:  More than three times a week    Attends Religious Services: More than 4 times per year    Active Member of Clubs or Organizations: No    Attends Banker Meetings: Never    Marital Status: Widowed   Past Surgical History:  Procedure Laterality Date   32 HOUR PH STUDY N/A 06/21/2018   Procedure: 24 HOUR PH STUDY;  Surgeon: Shellia Cleverly, DO;  Location: [facility];  Service: Gastroenterology;  Laterality: N/A;  with impedance   ABDOMINAL HYSTERECTOMY  2010   ANAL RECTAL MANOMETRY N/A 09/19/2019   Procedure: ANO RECTAL MANOMETRY;  Surgeon: Napoleon Form, MD;  Location: [facility];  Service: Endoscopy;  Laterality: N/A;   APPENDECTOMY  1949   BACK SURGERY  1973   BIOPSY  06/10/2020   Procedure: BIOPSY;  Surgeon:  Shellia Cleverly, DO;  Location: [facility];  Service: Gastroenterology;;  EGD and COLON   BREAST SURGERY     CATARACT EXTRACTION Bilateral 2011   CHOLECYSTECTOMY     COLON RESECTION N/A 06/12/2020   Procedure: LAPAROSCOPIC COLON RESECTION;  Surgeon: Abigail Miyamoto, MD;  Location: [facility];  Service: General;  Laterality: N/A;   COLONOSCOPY WITH PROPOFOL N/A 06/10/2020   Procedure: COLONOSCOPY WITH PROPOFOL;  Surgeon: Shellia Cleverly, DO;  Location: [facility];  Service: Gastroenterology;  Laterality: N/A;   ESOPHAGEAL MANOMETRY N/A 06/21/2018   Procedure: ESOPHAGEAL MANOMETRY (EM);  Surgeon: Shellia Cleverly, DO;  Location: [facility];  Service: Gastroenterology;  Laterality: N/A;   ESOPHAGOGASTRODUODENOSCOPY (EGD) WITH PROPOFOL N/A 06/10/2020   Procedure: ESOPHAGOGASTRODUODENOSCOPY (EGD) WITH PROPOFOL;  Surgeon: Shellia Cleverly, DO;  [facility]: [facility];  Service: Gastroenterology;  Laterality: N/A;   GALLBLADDER SURGERY  2015   INCISIONAL HERNIA REPAIR N/A 09/16/2021   Procedure: OPEN INCISIONAL HERNIA REPAIR WITH MESH;  Surgeon: Abigail Miyamoto, MD;  Location: [facility];  Service: General;  Laterality: N/A;   JOINT REPLACEMENT     RT TOTAL HIP / RT TOTAL KNEE   MASTECTOMY  1998   BILATERAL    PH IMPEDANCE STUDY  06/21/2018   Procedure: PH IMPEDANCE STUDY;  Surgeon: Shellia Cleverly, DO;  Location: [facility];  Service: Gastroenterology;;   POLYPECTOMY  06/10/2020   Procedure: POLYPECTOMY;  Surgeon: Shellia Cleverly, DO;  Location: [facility];  Service: Gastroenterology;;   SKIN CANCER EXCISION  2016   RT SIDE OF NOSE   SUBMUCOSAL TATTOO INJECTION  06/10/2020   Procedure: SUBMUCOSAL TATTOO INJECTION;  Surgeon: Shellia Cleverly, DO;  Location: [facility];  Service: Gastroenterology;;   TONSILLECTOMY  1953   TOTAL HIP ARTHROPLASTY  2010   RIGHT   TOTAL HIP ARTHROPLASTY Left 05/09/2015   Procedure: LEFT TOTAL HIP ARTHROPLASTY ANTERIOR APPROACH;  Surgeon: Ollen Gross, MD;  Location: [facility];  Service: Orthopedics;  Laterality: Left;   TOTAL KNEE ARTHROPLASTY  2001   TUMOR REMOVAL  2012   ABDOMINAL - NON CANCEROUS   Past Surgical History:  Procedure Laterality Date   48 HOUR PH STUDY N/A 06/21/2018   Procedure: 24 HOUR PH STUDY;  Surgeon: Shellia Cleverly, DO;  [facility]: [facility];  Service: Gastroenterology;  Laterality: N/A;  with impedance   ABDOMINAL HYSTERECTOMY  2010   ANAL RECTAL MANOMETRY N/A 09/19/2019   Procedure: ANO RECTAL MANOMETRY;  Surgeon: Napoleon Form, MD;  [facility]: [facility];  Service: Endoscopy;  Laterality: N/A;   APPENDECTOMY  1949   BACK SURGERY  1973   BIOPSY  06/10/2020  Procedure: BIOPSY;  Surgeon: Shellia Cleverly, DO;  [facility]: [facility];  Service: Gastroenterology;;  EGD and COLON   BREAST SURGERY     CATARACT EXTRACTION Bilateral 2011   CHOLECYSTECTOMY     COLON RESECTION N/A 06/12/2020   Procedure: LAPAROSCOPIC COLON RESECTION;  Surgeon: Abigail Miyamoto, MD;  Location: [facility];  Service: General;  Laterality: N/A;   COLONOSCOPY WITH PROPOFOL N/A 06/10/2020   Procedure: COLONOSCOPY WITH PROPOFOL;  Surgeon: Shellia Cleverly, DO;  [facility];  Service: Gastroenterology;  Laterality: N/A;   ESOPHAGEAL MANOMETRY N/A 06/21/2018   Procedure: ESOPHAGEAL MANOMETRY (EM);  Surgeon: Shellia Cleverly, DO;  [facility]: [facility];  Service: Gastroenterology;  Laterality: N/A;   ESOPHAGOGASTRODUODENOSCOPY (EGD) WITH PROPOFOL N/A 06/10/2020   Procedure: ESOPHAGOGASTRODUODENOSCOPY (EGD) WITH PROPOFOL;  Surgeon: Shellia Cleverly, DO;  [facility]: [facility];  Service: Gastroenterology;  Laterality: N/A;   GALLBLADDER SURGERY  2015   INCISIONAL HERNIA REPAIR N/A 09/16/2021   Procedure: OPEN INCISIONAL HERNIA REPAIR WITH MESH;  Surgeon: Abigail Miyamoto, MD;  Location: [facility];  Service: General;  Laterality: N/A;   JOINT REPLACEMENT     RT TOTAL HIP / RT TOTAL KNEE   MASTECTOMY  1998   BILATERAL     PH IMPEDANCE STUDY  06/21/2018   Procedure: PH IMPEDANCE STUDY;  Surgeon: Shellia Cleverly, DO;  [facility]: [facility];  Service: Gastroenterology;;   POLYPECTOMY  06/10/2020   Procedure: POLYPECTOMY;  Surgeon: Shellia Cleverly, DO;  [facility]: [facility];  Service: Gastroenterology;;   SKIN CANCER EXCISION  2016   RT SIDE OF NOSE   SUBMUCOSAL TATTOO INJECTION  06/10/2020   Procedure: SUBMUCOSAL TATTOO INJECTION;  Surgeon: Shellia Cleverly, DO;  [facility]: [facility];  Service: Gastroenterology;;   TONSILLECTOMY  1953   TOTAL HIP ARTHROPLASTY  2010   RIGHT   TOTAL HIP ARTHROPLASTY Left 05/09/2015   Procedure: LEFT TOTAL HIP ARTHROPLASTY ANTERIOR APPROACH;  Surgeon: Ollen Gross, MD;  Location: [facility];  Service: Orthopedics;  Laterality: Left;   TOTAL KNEE ARTHROPLASTY  2001   TUMOR REMOVAL  2012   ABDOMINAL - NON CANCEROUS   Past Medical History:  Diagnosis Date   Arthritis    Cancer (HCC)    HX BREAST CANCER/ SKIN CANCER   Colon cancer (HCC)    Complication of anesthesia    N/V WITH Lee   Difficulty sleeping    Fractured hip (HCC)    LEFT - AUG 2016   GERD (gastroesophageal reflux disease)    Hyperlipidemia    Hypertension    Melanoma (HCC)    Neuropathy    Nocturia    Osteopenia    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   PONV (postoperative nausea and vomiting)    PT STATES Lee CAUSED N/V   Rosacea    Sleep apnea    Stage 1 breast cancer, ER+, right (HCC) 07/04/2017   BP 127/69   Pulse 70   Ht 5\' 6"  (1.676 m)   Wt 212 lb 9.6 oz (96.4 kg)   SpO2 95%   BMI 34.31 kg/m   Opioid Risk Score:   Fall Risk Score:  `1  Depression screen [facility] 2/9     11/15/2022   11:26 AM 10/21/2022   11:16 AM 09/06/2022    3:07 PM 08/31/2022    1:08 PM 08/16/2022   11:13 AM 07/05/2022   11:17 AM 05/25/2022   11:39 AM  Depression screen PHQ 2/9  Decreased Interest 0 0 0 0  0 0 0  Down, Depressed, Hopeless 0 0 0 0 0 0 0  PHQ - 2 Score 0 0 0 0 0 0 0      Review of Systems  Constitutional: Negative.   HENT: Negative.    Eyes: Negative.   Respiratory: Negative.    Cardiovascular: Negative.   Gastrointestinal: Negative.   Endocrine: Negative.   Genitourinary: Negative.   Musculoskeletal:  Positive for back pain.  Skin: Negative.   Allergic/Immunologic: Negative.   Neurological: Negative.   Hematological: Negative.   Psychiatric/Behavioral: Negative.    All other systems reviewed and are negative.      Objective:   Physical Exam Vitals and nursing note reviewed.  Constitutional:      Appearance: Normal appearance.  Cardiovascular:     Rate and Rhythm: Normal rate and regular rhythm.     Pulses: Normal pulses.     Heart sounds: Normal heart sounds.  Pulmonary:     Effort: Pulmonary effort is normal.     Breath sounds: Normal breath sounds.  Musculoskeletal:     Cervical back: Normal range of motion and neck supple.     Comments: Normal Muscle Bulk and Muscle Testing Reveals:  Upper Extremities: Full ROM and Muscle Strength 5/5  Lumbar Paraspinal Tenderness: L-3-L-5 Lower Extremities: Decreased ROM and Muscle Strength 5/5 Bilateral Lower Extremities Flexion Produces Pain into her Bilateral Lower Extremities Arises from Table slowly using walker for support Narrow Based Gait     Skin:    General: Skin is warm and dry.  Neurological:     Mental Status: She is alert and oriented to person, place, and time.  Psychiatric:        Mood and Affect: Mood normal.        Behavior: Behavior normal.         Assessment & Plan:  1. Chronic Bilateral Leg pain: Paresthesia: Continue to Monitor. 11/15/2022 2. Paresthesia Crystal Lee Radiculitis: Crystal Lee has weaned herself off the Lyrica due to daytime drowsiness. We will continue to monitor. 11/15/2022 3. Pain of Left Wrist/ : No complaints today. S/P Carpal Tunnel Release on 06/03/2017 by Dr. Danielle Dess. Dr. Danielle Dess Following. 11/15/2022. 4. Fracture of superior pubic ramus.  Dr.  Lequita Halt Following.  Continue to monitor. 11/15/2022. 5. Bilateral Knee OA: Continue Voltaren Gel.  Continue to monitor. Orthopedist following. 11/15/2022 6. Polyarthralgia: Continue to alternate with heat and ice therapy. Continue current medication regime. Continue to monitor. 11/15/2022. 7. Chronic Pain Syndrome: Refilled::Hydrocodone  10/325 one tablet 4 times a day as needed for pain #120. We will continue the opioid monitoring program, this consists of regular clinic visits, examinations, urine drug screen, pill counts as well as use of [place] Controlled Substance Reporting system. A 12 month History has been reviewed on the [place] Controlled Substance Reporting System on 11/15/2022. 8. Lumbar Compression Fracture L2 and L3:  Crystal Lee refused physical therapy. Continue with rest/ heat therapy. Continue to Monitor 11/15/2022. 9. Muscle Spasm: Continue  Flexeril 5 mg at HS. Continue to monitor. 11/15/2022 10. Right Shoulder Tendonitis: No complaints today. Continue HEP as Tolerated. Alternate Ice and Heat Therapy. 11/15/2022  11. Greater Trochanter Bursitis: No complaints today. Continue to Monitor. Continue HEP as Tolerated. 11/15/2022   F/U in 1 Month

## 2022-11-16 ENCOUNTER — Other Ambulatory Visit (HOSPITAL_COMMUNITY): Payer: Self-pay

## 2022-11-18 ENCOUNTER — Other Ambulatory Visit: Payer: Self-pay

## 2022-11-18 ENCOUNTER — Other Ambulatory Visit (HOSPITAL_COMMUNITY): Payer: Self-pay

## 2022-12-07 ENCOUNTER — Encounter: Payer: Self-pay | Admitting: Family Medicine

## 2022-12-07 ENCOUNTER — Telehealth (INDEPENDENT_AMBULATORY_CARE_PROVIDER_SITE_OTHER): Payer: Medicare Other | Admitting: Family Medicine

## 2022-12-07 VITALS — BP 138/59 | HR 83

## 2022-12-07 DIAGNOSIS — U071 COVID-19: Secondary | ICD-10-CM

## 2022-12-07 MED ORDER — NIRMATRELVIR/RITONAVIR (PAXLOVID)TABLET
3.00 | ORAL_TABLET | Freq: Two times a day (BID) | ORAL | 0 refills | Status: AC
Start: 2022-12-07 — End: 2022-12-12

## 2022-12-07 MED ORDER — PROMETHAZINE-DM 6.25-15 MG/5ML PO SYRP
5.0000 mL | ORAL_SOLUTION | Freq: Four times a day (QID) | ORAL | 0 refills | Status: DC | PRN
Start: 2022-12-07 — End: 2022-12-23

## 2022-12-07 NOTE — Progress Notes (Signed)
MyChart Video Visit    Virtual Visit via Video Note   This patient is at least at moderate risk for complications without adequate follow up. This format is felt to be most appropriate for this patient at this time. Physical exam was limited by quality of the video and audio technology used for the visit. Apolonio Schneiders was able to get the patient set up on a video visit.  Patient location: Home with daughter  Patient and provider in visit Provider location: Office  I discussed the limitations of evaluation and management by telemedicine and the availability of in person appointments. The patient expressed understanding and agreed to proceed.  Visit Date: 12/07/2022  Today's healthcare provider: Donato Schultz, DO     Subjective:    Patient ID: Crystal Lee, female    DOB: 1933-08-03, 89 y.o.   MRN: [id number]  Chief Complaint  Patient presents with   Covid Positive    Symptoms started yesterday- HA, bodyaches, sneezing.     HPI Patient is in today for +covid test Discussed the use of AI scribe software for clinical note transcription with the patient, who gave verbal consent to proceed.  History of Present Illness   The patient, with a history of allergies to morphine and codeine, presents with a severe headache and back pain 'all the way down to my waist' that started yesterday. She also reports a 'pretty much' dry cough. She denies fever. She has been managing the pain with over-the-counter Tylenol. The patient's daughter reports two positive at-home COVID-19 tests, one taken last night and another this morning. The patient also mentions a concern about a persistent cough.       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   PONV (postoperative nausea and vomiting)    PT STATES MORPHINE CAUSED N/V   Rosacea    Sleep apnea    Stage 1 breast cancer, ER+, right (HCC) 07/04/2017    Past Surgical History:  Procedure Laterality Date   33 HOUR PH STUDY N/A 06/21/2018   Procedure: 24 HOUR PH STUDY;  Surgeon: Shellia Cleverly, DO;  [facility];  Service: Gastroenterology;  Laterality: N/A;  with impedance   ABDOMINAL HYSTERECTOMY  2010   ANAL RECTAL MANOMETRY N/A 09/19/2019   Procedure: ANO RECTAL MANOMETRY;  Surgeon: Napoleon Form, MD;  [facility];  Service: Endoscopy;  Laterality: N/A;   APPENDECTOMY  1949   BACK SURGERY  1973   BIOPSY  06/10/2020   Procedure: BIOPSY;  Surgeon: Shellia Cleverly, DO;  [facility];  Service: Gastroenterology;;  EGD and COLON   BREAST SURGERY     CATARACT EXTRACTION Bilateral 2011   CHOLECYSTECTOMY     COLON RESECTION N/A 06/12/2020   Procedure: LAPAROSCOPIC COLON RESECTION;  Surgeon: Abigail Miyamoto, MD;  [facility];  Service: General;  Laterality: N/A;   COLONOSCOPY WITH PROPOFOL N/A 06/10/2020   Procedure: COLONOSCOPY WITH PROPOFOL;  Surgeon: Shellia Cleverly,  DO;  [facility];  Service: Gastroenterology;  Laterality: N/A;   ESOPHAGEAL MANOMETRY N/A 06/21/2018   Procedure: ESOPHAGEAL MANOMETRY (EM);  Surgeon: Shellia Cleverly, DO;  [facility];  Service: Gastroenterology;  Laterality: N/A;   ESOPHAGOGASTRODUODENOSCOPY (EGD) WITH PROPOFOL N/A 06/10/2020   Procedure: ESOPHAGOGASTRODUODENOSCOPY (EGD) WITH PROPOFOL;  Surgeon: Shellia Cleverly, DO;  [facility];  Service: Gastroenterology;  Laterality: N/A;   GALLBLADDER SURGERY  2015   INCISIONAL HERNIA REPAIR N/A 09/16/2021   Procedure: OPEN INCISIONAL HERNIA REPAIR WITH MESH;  Surgeon: Abigail Miyamoto, MD;  [facility];  Service: General;  Laterality: N/A;   JOINT REPLACEMENT     RT TOTAL HIP / RT TOTAL KNEE   MASTECTOMY  1998   BILATERAL    PH IMPEDANCE STUDY  06/21/2018   Procedure: PH IMPEDANCE STUDY;  Surgeon: Shellia Cleverly, DO;  [facility];  Service: Gastroenterology;;   POLYPECTOMY  06/10/2020   Procedure: POLYPECTOMY;  Surgeon: Shellia Cleverly, DO;  [facility];  Service: Gastroenterology;;   SKIN CANCER EXCISION  2016   RT SIDE OF NOSE   SUBMUCOSAL TATTOO INJECTION  06/10/2020   Procedure: SUBMUCOSAL TATTOO INJECTION;  Surgeon: Shellia Cleverly, DO;  [facility];  Service: Gastroenterology;;   TONSILLECTOMY  1953   TOTAL HIP ARTHROPLASTY  2010   RIGHT   TOTAL HIP ARTHROPLASTY Left 05/09/2015   Procedure: LEFT TOTAL HIP ARTHROPLASTY ANTERIOR APPROACH;  Surgeon: Ollen Gross, MD;  [facility];  Service: Orthopedics;  Laterality: Left;   TOTAL KNEE ARTHROPLASTY  2001   TUMOR REMOVAL  2012   ABDOMINAL - NON CANCEROUS    Family History  Problem Relation Age of Onset   Other Mother        complications from flu   Other Father        unsure of cause   Colon cancer Neg Hx     Social History   Socioeconomic History   Marital status: Widowed    Spouse name: Not on file   Number of children: 3   Years of education: 16 years   Highest education level: Not on file  Occupational History   Occupation: Retired  Tobacco Use   Smoking status: Former    Packs/day: 0.50    Years: 20.00    Additional pack years: 0.00    Total pack years: 10.00    Types: Cigarettes    Quit date: 02/03/1976    Years since quitting: 46.8   Smokeless tobacco: Never  Vaping Use   Vaping Use: Never used  Substance and Sexual Activity   Alcohol use: No   Drug use: No   Sexual activity: Not Currently    Birth control/protection: Post-menopausal  Other Topics Concern   Not on file  Social History Narrative   Lives at home with husband.    Right-handed.   No caffeine use.   Social Determinants of Health   Financial Resource Strain: Low Risk  (08/07/2021)   Overall Financial Resource Strain (CARDIA)    Difficulty of Paying Living Expenses: Not hard at all  Food Insecurity: No Food Insecurity (08/31/2022)   Hunger Vital Sign    Worried About Running Out of Food in the Last Year: Never true    Ran Out of Food in the Last Year: Never true  Transportation Needs: No Transportation Needs (08/31/2022)   PRAPARE - Administrator, Civil Service (Medical): No    Lack of Transportation (  Non-Medical): No  Physical Activity: Inactive (08/31/2022)   Exercise Vital Sign    Days of Exercise per Week: 0 days    Minutes of Exercise per Session: 0 min  Stress: No Stress Concern Present (08/31/2022)   [name] of Occupational Health - Occupational Stress Questionnaire    Feeling of Stress : Not at all  Social Connections: Moderately Isolated (08/07/2021)   Social Connection and Isolation Panel [NHANES]    Frequency of Communication with Friends and Family: More than three times a week    Frequency of Social Gatherings with Friends and Family: More than three times a week    Attends Religious Services: More than 4 times per year    Active Member of [employer] or Organizations: No    Attends Banker Meetings: Never    Marital Status: Widowed  Intimate Partner Violence: Not At Risk (08/31/2022)   Humiliation, Afraid, Rape, and Kick questionnaire    Fear of Current or Ex-Partner: No    Emotionally Abused: No    Physically Abused: No    Sexually Abused: No    Outpatient Medications Prior to Visit  Medication Sig Dispense Refill   aspirin EC 81 MG tablet Take 81 mg by mouth daily. Swallow whole.     atorvastatin (LIPITOR) 20 MG tablet Take 1 tablet (20 mg total) by mouth daily. 90 tablet 3   bacitracin ophthalmic ointment Apply to eyelids at bedtime as needed. 3.5 g 6   Cholecalciferol (VITAMIN D3) 1.25 MG (50000  UT) CAPS Take 1 weekly 12 capsule 3   conjugated estrogens (PREMARIN) vaginal cream Place 1 Applicatorful vaginally 2 (two) times a week. Place 0.5g nightly twice a week 90 g 3   cyclobenzaprine (FLEXERIL) 5 MG tablet Take 1 tablet (5 mg total) by mouth at bedtime as needed for muscle spasms. 30 tablet 1   Ergocalciferol (VITAMIN D2 PO)      estradiol (ESTRACE) 0.1 MG/GM vaginal cream Place 0.5 g vaginally 2 (two) times a week as directed 30 g 11   HYDROcodone-acetaminophen (NORCO) 10-325 MG tablet Take 1 tablet by mouth every 6 (six) hours as needed. 120 tablet 0   ipratropium (ATROVENT) 0.06 % nasal spray Place 2 sprays into both nostrils 3 (three) times daily. 15 mL 5   lidocaine 4 % Place 1-3 patches onto the skin daily. Use for up to 12 hours in a 24 hour period 30 patch 3   losartan (COZAAR) 25 MG tablet Take 1 tablet (25 mg total) by mouth daily. 90 tablet 3   memantine (NAMENDA) 5 MG tablet Take 1 tablet (5 mg total) by mouth at bedtime. 30 tablet 11   methenamine (HIPREX) 1 g tablet Take 1 tablet (1 g total) by mouth 2 (two) times daily with a meal. 60 tablet 11   mirabegron ER (MYRBETRIQ) 50 MG TB24 tablet Take 1 tablet (50 mg total) by mouth daily. 90 tablet 3   Multiple Vitamins-Minerals (MULTIVITAMIN ADULTS) TABS Take 1 tablet by mouth daily.     neomycin-polymyxin b-dexamethasone (MAXITROL) 3.5-10000-0.1 SUSP      valACYclovir (VALTREX) 1000 MG tablet Take 1 tablet (1,000 mg total) by mouth 2 (two) times daily. 60 tablet 2   vitamin B-12 (CYANOCOBALAMIN) 1000 MCG tablet Take 1,000 mcg by mouth daily.     Vitamin D, Ergocalciferol, (DRISDOL) 1.25 MG (50000 UNIT) CAPS capsule Take 50,000 Units by mouth once a week.     No facility-administered medications prior to visit.    Allergies  Allergen Reactions   Morphine Nausea And Vomiting   Propoxyphene Nausea And Vomiting, Palpitations and Other (See Comments)    Darvocet Causes Sweats   Donepezil Diarrhea and Other (See Comments)    Morphine And Codeine Nausea And Vomiting   Tape Rash and Other (See Comments)    ROS     Objective:    Physical Exam  BP (!) 138/59   Pulse 83  Wt Readings from Last 3 Encounters:  11/15/22 212 lb 9.6 oz (96.4 kg)  10/21/22 212 lb 12.8 oz (96.5 kg)  09/23/22 213 lb (96.6 kg)       Assessment & Plan:  COVID-19 -     nirmatrelvir/ritonavir; Take 3 tablets by mouth 2 (two) times daily for 5 days. (Take nirmatrelvir 150 mg two tablets twice daily for 5 days and ritonavir 100 mg one tablet twice daily for 5 days) Patient GFR is 81  Dispense: 30 tablet; Refill: 0 -     Promethazine-DM; Take 5 mLs by mouth 4 (four) times daily as needed.  Dispense: 118 mL; Refill: 0   Assessment and Plan    COVID-19: Positive test with symptoms of headache, back pain, and cough starting yesterday. No fever reported. Discussed the benefits of starting Paxlovid within five days of symptom onset. -Start Paxlovid as per protocol. -Continue Tylenol for aches and pains. -Check oxygen saturation periodically at home, seek emergency care if saturation falls below 95%. -Isolate for at least 5 days, then can leave the house with a mask for an additional 5 days if symptoms improve.  Cough: Dry cough associated with COVID-19 infection. -Prescribe Phenergan DM for cough, to be used primarily at night due to sedating effects. -If cough changes or becomes productive, contact the office as this may indicate a secondary bacterial infection.        I discussed the assessment and treatment plan with the patient. The patient was provided an opportunity to ask questions and all were answered. The patient agreed with the plan and demonstrated an understanding of the instructions.   The patient was advised to call back or seek an in-person evaluation if the symptoms worsen or if the condition fails to improve as anticipated.  Donato Schultz, DO [facility] at [facility] [phone] (phone) [phone] (fax)  [facility]

## 2022-12-08 ENCOUNTER — Inpatient Hospital Stay: Payer: Medicare Other

## 2022-12-08 ENCOUNTER — Ambulatory Visit: Payer: TRICARE For Life (TFL) | Admitting: Family

## 2022-12-08 ENCOUNTER — Ambulatory Visit: Payer: TRICARE For Life (TFL)

## 2022-12-10 ENCOUNTER — Ambulatory Visit: Payer: Medicare Other

## 2022-12-14 ENCOUNTER — Other Ambulatory Visit (HOSPITAL_COMMUNITY): Payer: Self-pay

## 2022-12-15 ENCOUNTER — Inpatient Hospital Stay: Payer: Medicare Other | Admitting: Family

## 2022-12-15 ENCOUNTER — Other Ambulatory Visit (HOSPITAL_COMMUNITY): Payer: Self-pay

## 2022-12-15 ENCOUNTER — Inpatient Hospital Stay: Payer: Medicare Other

## 2022-12-15 MED ORDER — BACITRACIN 500 UNIT/GM OP OINT
TOPICAL_OINTMENT | OPHTHALMIC | 98 refills | Status: DC
  Filled 2022-12-15: qty 3.5, 30d supply, fill #0

## 2022-12-17 ENCOUNTER — Ambulatory Visit: Payer: Self-pay

## 2022-12-17 ENCOUNTER — Other Ambulatory Visit (HOSPITAL_COMMUNITY): Payer: Self-pay

## 2022-12-20 ENCOUNTER — Encounter: Payer: Medicare Other | Attending: Physical Medicine & Rehabilitation | Admitting: Registered Nurse

## 2022-12-20 ENCOUNTER — Encounter: Payer: Self-pay | Admitting: Registered Nurse

## 2022-12-20 ENCOUNTER — Other Ambulatory Visit (HOSPITAL_COMMUNITY): Payer: Self-pay

## 2022-12-20 ENCOUNTER — Other Ambulatory Visit: Payer: Self-pay

## 2022-12-20 VITALS — BP 114/72 | HR 95 | Ht 66.0 in | Wt 212.0 lb

## 2022-12-20 DIAGNOSIS — M7061 Trochanteric bursitis, right hip: Secondary | ICD-10-CM | POA: Insufficient documentation

## 2022-12-20 DIAGNOSIS — M7062 Trochanteric bursitis, left hip: Secondary | ICD-10-CM | POA: Diagnosis not present

## 2022-12-20 DIAGNOSIS — G894 Chronic pain syndrome: Secondary | ICD-10-CM | POA: Diagnosis not present

## 2022-12-20 DIAGNOSIS — M2550 Pain in unspecified joint: Secondary | ICD-10-CM | POA: Insufficient documentation

## 2022-12-20 DIAGNOSIS — Z79891 Long term (current) use of opiate analgesic: Secondary | ICD-10-CM | POA: Diagnosis not present

## 2022-12-20 DIAGNOSIS — M170 Bilateral primary osteoarthritis of knee: Secondary | ICD-10-CM | POA: Diagnosis not present

## 2022-12-20 DIAGNOSIS — M961 Postlaminectomy syndrome, not elsewhere classified: Secondary | ICD-10-CM | POA: Insufficient documentation

## 2022-12-20 DIAGNOSIS — R202 Paresthesia of skin: Secondary | ICD-10-CM | POA: Insufficient documentation

## 2022-12-20 DIAGNOSIS — Z5181 Encounter for therapeutic drug level monitoring: Secondary | ICD-10-CM | POA: Insufficient documentation

## 2022-12-20 DIAGNOSIS — S32000S Wedge compression fracture of unspecified lumbar vertebra, sequela: Secondary | ICD-10-CM | POA: Insufficient documentation

## 2022-12-20 MED ORDER — HYDROCODONE-ACETAMINOPHEN 10-325 MG PO TABS
1.0000 | ORAL_TABLET | Freq: Four times a day (QID) | ORAL | 0 refills | Status: DC | PRN
  Filled 2022-12-20: qty 120, 30d supply, fill #0

## 2022-12-20 NOTE — Progress Notes (Signed)
Subjective:    Patient ID: Crystal Lee, female    DOB: 11-14-1933, 89 y.o.   MRN: [id number]  HPI: Crystal Lee is a 89 y.o. female who returns for follow up appointment for chronic pain and medication refill. She states her pain is located in her lower back, bilateral hips and bilateral lower extremities with tingling. She also reports bilateral knee pain L>R. She rates her pain 5. Her current exercise regime is walking with her walker..  Crystal Lee Morphine equivalent is 40.00 MME.   Oral Swab was ordered today.      Pain Inventory Average Pain 5 Pain Right Now 5 My pain is sharp, stabbing, tingling, and aching  In the last 24 hours, has pain interfered with the following? General activity 5 Relation with others 5 Enjoyment of life 5 What TIME of day is your pain at its worst? evening Sleep (in general)   Pain is worse with: walking, standing, and some activites Pain improves with: rest, medication, and heat Relief from Meds: 3  Family History  Problem Relation Age of Onset   Other Mother        complications from flu   Other Father        unsure of cause   Colon cancer Neg Hx    Social History   Socioeconomic History   Marital status: Widowed    Spouse name: Not on file   Number of children: 3   Years of education: 16 years   Highest education level: Not on file  Occupational History   Occupation: Retired  Tobacco Use   Smoking status: Former    Current packs/day: 0.00    Average packs/day: 0.5 packs/day for 20.0 years (10.0 ttl pk-yrs)    Types: Cigarettes    Start date: 02/03/1956    Quit date: 02/03/1976    Years since quitting: 46.9   Smokeless tobacco: Never  Vaping Use   Vaping status: Never Used  Substance and Sexual Activity   Alcohol use: No   Drug use: No   Sexual activity: Not Currently    Birth control/protection: Post-menopausal  Other Topics Concern   Not on file  Social History Narrative   Lives at home with husband.   Right-handed.    No caffeine use.   Social Determinants of Health   Financial Resource Strain: Low Risk  (08/07/2021)   Overall Financial Resource Strain (CARDIA)    Difficulty of Paying Living Expenses: Not hard at all  Food Insecurity: No Food Insecurity (08/31/2022)   Hunger Vital Sign    Worried About Running Out of Food in the Last Year: Never true    Ran Out of Food in the Last Year: Never true  Transportation Needs: No Transportation Needs (08/31/2022)   PRAPARE - Administrator, Civil Service (Medical): No    Lack of Transportation (Non-Medical): No  Physical Activity: Inactive (08/31/2022)   Exercise Vital Sign    Days of Exercise per Week: 0 days    Minutes of Exercise per Session: 0 min  Stress: No Stress Concern Present (08/31/2022)   [name] of Occupational Health - Occupational Stress Questionnaire    Feeling of Stress : Not at all  Social Connections: Moderately Isolated (08/07/2021)   Social Connection and Isolation Panel [NHANES]    Frequency of Communication with Friends and Family: More than three times a week    Frequency of Social Gatherings with Friends and Family: More than three times a week  Attends Religious Services: More than 4 times per year    Active Member of Clubs or Organizations: No    Attends Banker Meetings: Never    Marital Status: Widowed   Past Surgical History:  Procedure Laterality Date   82 HOUR PH STUDY N/A 06/21/2018   Procedure: 24 HOUR PH STUDY;  Surgeon: Shellia Cleverly, DO;  [facility];  Service: Gastroenterology;  Laterality: N/A;  with impedance   ABDOMINAL HYSTERECTOMY  2010   ANAL RECTAL MANOMETRY N/A 09/19/2019   Procedure: ANO RECTAL MANOMETRY;  Surgeon: Napoleon Form, MD;  [facility];  Service: Endoscopy;  Laterality: N/A;   APPENDECTOMY  1949   BACK SURGERY  1973   BIOPSY  06/10/2020   Procedure: BIOPSY;  Surgeon: Shellia Cleverly, DO;  [facility];  Service:  Gastroenterology;;  EGD and COLON   BREAST SURGERY     CATARACT EXTRACTION Bilateral 2011   CHOLECYSTECTOMY     COLON RESECTION N/A 06/12/2020   Procedure: LAPAROSCOPIC COLON RESECTION;  Surgeon: Abigail Miyamoto, MD;  [facility];  Service: General;  Laterality: N/A;   COLONOSCOPY WITH PROPOFOL N/A 06/10/2020   Procedure: COLONOSCOPY WITH PROPOFOL;  Surgeon: Shellia Cleverly, DO;  [facility];  Service: Gastroenterology;  Laterality: N/A;   ESOPHAGEAL MANOMETRY N/A 06/21/2018   Procedure: ESOPHAGEAL MANOMETRY (EM);  Surgeon: Shellia Cleverly, DO;  [facility];  Service: Gastroenterology;  Laterality: N/A;   ESOPHAGOGASTRODUODENOSCOPY (EGD) WITH PROPOFOL N/A 06/10/2020   Procedure: ESOPHAGOGASTRODUODENOSCOPY (EGD) WITH PROPOFOL;  Surgeon: Shellia Cleverly, DO;  [facility];  Service: Gastroenterology;  Laterality: N/A;   GALLBLADDER SURGERY  2015   INCISIONAL HERNIA REPAIR N/A 09/16/2021   Procedure: OPEN INCISIONAL HERNIA REPAIR WITH MESH;  Surgeon: Abigail Miyamoto, MD;  [facility];  Service: General;  Laterality: N/A;   JOINT REPLACEMENT     RT TOTAL HIP / RT TOTAL KNEE   MASTECTOMY  1998   BILATERAL    PH IMPEDANCE STUDY  06/21/2018   Procedure: PH IMPEDANCE STUDY;  Surgeon: Shellia Cleverly, DO;  [facility];  Service: Gastroenterology;;   POLYPECTOMY  06/10/2020   Procedure: POLYPECTOMY;  Surgeon: Shellia Cleverly, DO;  [facility];  Service: Gastroenterology;;   SKIN CANCER EXCISION  2016   RT SIDE OF NOSE   SUBMUCOSAL TATTOO INJECTION  06/10/2020   Procedure: SUBMUCOSAL TATTOO INJECTION;  Surgeon: Shellia Cleverly, DO;  [facility];  Service: Gastroenterology;;   TONSILLECTOMY  1953   TOTAL HIP ARTHROPLASTY  2010   RIGHT   TOTAL HIP ARTHROPLASTY Left 05/09/2015   Procedure: LEFT TOTAL HIP ARTHROPLASTY ANTERIOR APPROACH;  Surgeon: Ollen Gross, MD;  [facility];  Service: Orthopedics;   Laterality: Left;   TOTAL KNEE ARTHROPLASTY  2001   TUMOR REMOVAL  2012   ABDOMINAL - NON CANCEROUS   Past Surgical History:  Procedure Laterality Date   61 HOUR PH STUDY N/A 06/21/2018   Procedure: 24 HOUR PH STUDY;  Surgeon: Shellia Cleverly, DO;  [facility];  Service: Gastroenterology;  Laterality: N/A;  with impedance   ABDOMINAL HYSTERECTOMY  2010   ANAL RECTAL MANOMETRY N/A 09/19/2019   Procedure: ANO RECTAL MANOMETRY;  Surgeon: Napoleon Form, MD;  [facility];  Service: Endoscopy;  Laterality: N/A;   APPENDECTOMY  1949   BACK SURGERY  1973   BIOPSY  06/10/2020   Procedure: BIOPSY;  Surgeon: Shellia Cleverly, DO;  [facility];  Service: Gastroenterology;;  EGD and COLON   BREAST SURGERY     CATARACT EXTRACTION Bilateral 2011   CHOLECYSTECTOMY     COLON RESECTION N/A 06/12/2020   Procedure: LAPAROSCOPIC COLON RESECTION;  Surgeon: Abigail Miyamoto, MD;  [facility];  Service: General;  Laterality: N/A;   COLONOSCOPY WITH PROPOFOL N/A 06/10/2020   Procedure: COLONOSCOPY WITH PROPOFOL;  Surgeon: Shellia Cleverly, DO;  [facility];  Service: Gastroenterology;  Laterality: N/A;   ESOPHAGEAL MANOMETRY N/A 06/21/2018   Procedure: ESOPHAGEAL MANOMETRY (EM);  Surgeon: Shellia Cleverly, DO;  [facility];  Service: Gastroenterology;  Laterality: N/A;   ESOPHAGOGASTRODUODENOSCOPY (EGD) WITH PROPOFOL N/A 06/10/2020   Procedure: ESOPHAGOGASTRODUODENOSCOPY (EGD) WITH PROPOFOL;  Surgeon: Shellia Cleverly, DO;  [facility];  Service: Gastroenterology;  Laterality: N/A;   GALLBLADDER SURGERY  2015   INCISIONAL HERNIA REPAIR N/A 09/16/2021   Procedure: OPEN INCISIONAL HERNIA REPAIR WITH MESH;  Surgeon: Abigail Miyamoto, MD;  [facility];  Service: General;  Laterality: N/A;   JOINT REPLACEMENT     RT TOTAL HIP / RT TOTAL KNEE   MASTECTOMY  1998   BILATERAL    PH IMPEDANCE STUDY  06/21/2018   Procedure: PH  IMPEDANCE STUDY;  Surgeon: Shellia Cleverly, DO;  [facility];  Service: Gastroenterology;;   POLYPECTOMY  06/10/2020   Procedure: POLYPECTOMY;  Surgeon: Shellia Cleverly, DO;  [facility];  Service: Gastroenterology;;   SKIN CANCER EXCISION  2016   RT SIDE OF NOSE   SUBMUCOSAL TATTOO INJECTION  06/10/2020   Procedure: SUBMUCOSAL TATTOO INJECTION;  Surgeon: Shellia Cleverly, DO;  [facility];  Service: Gastroenterology;;   TONSILLECTOMY  1953   TOTAL HIP ARTHROPLASTY  2010   RIGHT   TOTAL HIP ARTHROPLASTY Left 05/09/2015   Procedure: LEFT TOTAL HIP ARTHROPLASTY ANTERIOR APPROACH;  Surgeon: Ollen Gross, MD;  [facility];  Service: Orthopedics;  Laterality: Left;   TOTAL KNEE ARTHROPLASTY  2001   TUMOR REMOVAL  2012   ABDOMINAL - NON CANCEROUS   Past Medical History:  Diagnosis Date   Arthritis    Cancer (HCC)    HX BREAST CANCER/ SKIN CANCER   Colon cancer (HCC)    Complication of anesthesia    N/V WITH MORPHINE   Difficulty sleeping    Fractured hip (HCC)    LEFT - AUG 2016   GERD (gastroesophageal reflux disease)    Hyperlipidemia    Hypertension    Melanoma (HCC)    Neuropathy    Nocturia    Osteopenia    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  Opioid Risk Score:   Fall Risk Score:  `1  Depression screen [facility] 2/9     11/15/2022   11:26 AM 10/21/2022   11:16 AM 09/06/2022    3:07 PM 08/31/2022    1:08 PM 08/16/2022   11:13 AM 07/05/2022   11:17 AM 05/25/2022   11:39 AM  Depression screen PHQ 2/9  Decreased Interest 0 0 0 0 0 0 0  Down, Depressed, Hopeless 0 0 0 0 0 0 0  PHQ - 2 Score 0 0 0 0 0 0 0    Review of Systems  Musculoskeletal:  Positive for back pain and gait problem.       Pain from the upper back down  to both feet  All other systems reviewed and are negative.      Objective:   Physical Exam Vitals and nursing note reviewed.  Constitutional:      Appearance: Normal appearance.  Cardiovascular:     Rate and Rhythm: Normal rate and regular rhythm.     Pulses: Normal pulses.     Heart sounds: Normal heart sounds.  Pulmonary:     Effort: Pulmonary effort is normal.     Breath sounds: Normal breath sounds.  Musculoskeletal:     Cervical back: Normal range of motion and neck supple.     Comments: Normal Muscle Bulk and Muscle Testing Reveals:  Upper Extremities: Full ROM and Muscle Strength 5/5  Lumbar Paraspinal Tenderness: L-3-L-5 Lower Extremities: Decreased ROM and Muscle Strength 5/5 Left  Lower Extremity Flexion Produces Pain into her Left Patella  Arises from Table slowly using walker for support Narrow Based Gait     Skin:    General: Skin is warm and dry.  Neurological:     Mental Status: She is alert and oriented to person, place, and time.  Psychiatric:        Mood and Affect: Mood normal.        Behavior: Behavior normal.         Assessment & Plan:  1. Chronic Bilateral Leg pain: Paresthesia: Continue to Monitor. 12/20/2022 2. Paresthesia Salli Real Radiculitis: Crystal Lee has weaned herself off the Lyrica due to daytime drowsiness. We will continue to monitor. 12/20/2022 3. Pain of Left Wrist/ : No complaints today. S/P Carpal Tunnel Release on 06/03/2017 by Dr. Danielle Dess. Dr. Danielle Dess Following. 12/20/2022. 4. Fracture of superior pubic ramus.  Dr. Lequita Halt Following.  Continue to monitor. 12/20/2022. 5. Bilateral Knee OA: Continue Voltaren Gel.  Continue to monitor. Orthopedist following. 12/20/2022 6. Polyarthralgia: Continue to alternate with heat and ice therapy. Continue current medication regime. Continue to monitor. 12/20/2022. 7. Chronic Pain Syndrome: Refilled::Hydrocodone  10/325 one tablet 4 times a day as needed for pain #120. We will continue the opioid  monitoring program, this consists of regular clinic visits, examinations, urine drug screen, pill counts as well as use of [place] Controlled Substance Reporting system. A 12 month History has been reviewed on the [place] Controlled Substance Reporting System on 12/20/2022. 8. Lumbar Compression Fracture L2 and L3:  Crystal Lee refused physical therapy. Continue with rest/ heat therapy. Continue to Monitor 12/20/2022. 9. Muscle Spasm: Continue  Flexeril 5 mg at HS. Continue to monitor. 12/20/2022 10. Right Shoulder Tendonitis: No complaints today. Continue HEP as Tolerated. Alternate Ice and Heat Therapy. 12/20/2022  11. Greater Trochanter Bursitis:  Continue to Monitor. Continue HEP as Tolerated. 12/20/2022   F/U in 1 Month

## 2022-12-21 ENCOUNTER — Other Ambulatory Visit: Payer: Self-pay

## 2022-12-21 ENCOUNTER — Inpatient Hospital Stay: Payer: Medicare Other | Attending: Hematology & Oncology

## 2022-12-21 ENCOUNTER — Inpatient Hospital Stay: Payer: Medicare Other

## 2022-12-21 ENCOUNTER — Inpatient Hospital Stay (HOSPITAL_BASED_OUTPATIENT_CLINIC_OR_DEPARTMENT_OTHER): Payer: Medicare Other | Admitting: Medical Oncology

## 2022-12-21 ENCOUNTER — Other Ambulatory Visit (HOSPITAL_COMMUNITY): Payer: Self-pay

## 2022-12-21 ENCOUNTER — Encounter: Payer: Self-pay | Admitting: Medical Oncology

## 2022-12-21 VITALS — BP 135/46 | HR 79 | Temp 98.0°F | Resp 18 | Ht 66.0 in | Wt 212.0 lb

## 2022-12-21 DIAGNOSIS — C186 Malignant neoplasm of descending colon: Secondary | ICD-10-CM | POA: Diagnosis not present

## 2022-12-21 DIAGNOSIS — Z862 Personal history of diseases of the blood and blood-forming organs and certain disorders involving the immune mechanism: Secondary | ICD-10-CM | POA: Diagnosis not present

## 2022-12-21 DIAGNOSIS — Z79899 Other long term (current) drug therapy: Secondary | ICD-10-CM | POA: Diagnosis not present

## 2022-12-21 DIAGNOSIS — M818 Other osteoporosis without current pathological fracture: Secondary | ICD-10-CM

## 2022-12-21 DIAGNOSIS — T386X5A Adverse effect of antigonadotrophins, antiestrogens, antiandrogens, not elsewhere classified, initial encounter: Secondary | ICD-10-CM

## 2022-12-21 DIAGNOSIS — M810 Age-related osteoporosis without current pathological fracture: Secondary | ICD-10-CM | POA: Insufficient documentation

## 2022-12-21 DIAGNOSIS — C50911 Malignant neoplasm of unspecified site of right female breast: Secondary | ICD-10-CM

## 2022-12-21 DIAGNOSIS — M8589 Other specified disorders of bone density and structure, multiple sites: Secondary | ICD-10-CM

## 2022-12-21 DIAGNOSIS — Z7982 Long term (current) use of aspirin: Secondary | ICD-10-CM | POA: Insufficient documentation

## 2022-12-21 DIAGNOSIS — Z853 Personal history of malignant neoplasm of breast: Secondary | ICD-10-CM | POA: Insufficient documentation

## 2022-12-21 DIAGNOSIS — Z170 Estrogen receptor positive status [ER+]: Secondary | ICD-10-CM

## 2022-12-21 DIAGNOSIS — Z8616 Personal history of COVID-19: Secondary | ICD-10-CM | POA: Insufficient documentation

## 2022-12-21 DIAGNOSIS — D509 Iron deficiency anemia, unspecified: Secondary | ICD-10-CM | POA: Diagnosis not present

## 2022-12-21 DIAGNOSIS — K0889 Other specified disorders of teeth and supporting structures: Secondary | ICD-10-CM | POA: Diagnosis not present

## 2022-12-21 DIAGNOSIS — Z85038 Personal history of other malignant neoplasm of large intestine: Secondary | ICD-10-CM | POA: Insufficient documentation

## 2022-12-21 LAB — RETICULOCYTES
Immature Retic Fract: 6.1 % (ref 2.3–15.9)
RBC.: 4.38 MIL/uL (ref 3.87–5.11)
Retic Count, Absolute: 42.9 10*3/uL (ref 19.0–186.0)
Retic Ct Pct: 1 % (ref 0.4–3.1)

## 2022-12-21 LAB — CBC WITH DIFFERENTIAL (CANCER CENTER ONLY)
Abs Immature Granulocytes: 0.05 10*3/uL (ref 0.00–0.07)
Basophils Absolute: 0 10*3/uL (ref 0.0–0.1)
Basophils Relative: 0 %
Eosinophils Absolute: 0.2 10*3/uL (ref 0.0–0.5)
Eosinophils Relative: 3 %
HCT: 40.6 % (ref 36.0–46.0)
Hemoglobin: 13 g/dL (ref 12.0–15.0)
Immature Granulocytes: 1 %
Lymphocytes Relative: 28 %
Lymphs Abs: 1.7 10*3/uL (ref 0.7–4.0)
MCH: 29.4 pg (ref 26.0–34.0)
MCHC: 32 g/dL (ref 30.0–36.0)
MCV: 91.9 fL (ref 80.0–100.0)
Monocytes Absolute: 0.4 10*3/uL (ref 0.1–1.0)
Monocytes Relative: 7 %
Neutro Abs: 3.6 10*3/uL (ref 1.7–7.7)
Neutrophils Relative %: 61 %
Platelet Count: 118 10*3/uL — ABNORMAL LOW (ref 150–400)
RBC: 4.42 MIL/uL (ref 3.87–5.11)
RDW: 12.5 % (ref 11.5–15.5)
WBC Count: 6 10*3/uL (ref 4.0–10.5)
nRBC: 0 % (ref 0.0–0.2)

## 2022-12-21 LAB — CMP (CANCER CENTER ONLY)
ALT: 20 U/L (ref 0–44)
AST: 29 U/L (ref 15–41)
Albumin: 4.2 g/dL (ref 3.5–5.0)
Alkaline Phosphatase: 104 U/L (ref 38–126)
Anion gap: 6 (ref 5–15)
BUN: 13 mg/dL (ref 8–23)
CO2: 30 mmol/L (ref 22–32)
Calcium: 9.7 mg/dL (ref 8.9–10.3)
Chloride: 103 mmol/L (ref 98–111)
Creatinine: 0.58 mg/dL (ref 0.44–1.00)
GFR, Estimated: >60 mL/min (ref >60)
Glucose, Bld: 108 mg/dL — ABNORMAL HIGH (ref 70–99)
Potassium: 4.2 mmol/L (ref 3.5–5.1)
Sodium: 139 mmol/L (ref 135–145)
Total Bilirubin: 0.5 mg/dL (ref 0.3–1.2)
Total Protein: 7.2 g/dL (ref 6.5–8.1)

## 2022-12-21 LAB — FERRITIN: Ferritin: 63 ng/mL (ref 11–307)

## 2022-12-21 LAB — CEA (ACCESS): CEA (CHCC): 2.74 ng/mL (ref 0.00–5.00)

## 2022-12-21 NOTE — Progress Notes (Signed)
Hematology and Oncology Follow Up Visit  Crystal Lee [id number] [birth year] 89 y.o. 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 Yearly -- next dose on 11/2022    Interim History:  Crystal Lee is here today for follow-up.   Today she reports that she is overall doing ok for someone her age. She is considering hearing aids. She also reports a loose tooth which she is having looked at tomorrow at her dentist. She also reports that she had a mild case of COVID-19 at the beginning of this month. She is feeling well now.   No fever, chills, n/v, cough, rash, dizziness, SOB, chest pain, palpitations, abdominal pain or changes in bowel or bladder habits.  No blood loss, bruising or petechiae.  No skin changes or changes to chest wall.  No adenopathy or lymphedema noted.  No swelling in her extremities.  She has chronic back pain.  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  No falls or syncope reported.  Appetite and hydration are good.  Wt Readings from Last 3 Encounters:  12/21/22 212 lb (96.2 kg)  12/20/22 212 lb (96.2 kg)  11/15/22 212 lb 9.6 oz (96.4 kg)     ECOG Performance Status: 1 - Symptomatic but completely ambulatory  Medications:  Allergies as of 12/21/2022       Reactions   Morphine Nausea And Vomiting   Propoxyphene Nausea And Vomiting, Palpitations, Other (See Comments)   Darvocet Causes Sweats   Donepezil Diarrhea, Other (See Comments)   Morphine And Codeine Nausea And Vomiting   Tape Rash, Other (See Comments)        Medication List        Accurate as of  December 21, 2022 11:48 AM. If you have any questions, ask your nurse or doctor.          aspirin EC 81 MG tablet Take 81 mg by mouth daily. Swallow whole.   atorvastatin 20 MG tablet Commonly known as: LIPITOR Take 1 tablet (20 mg total) by mouth daily. What changed: Another medication with the same name was removed. Continue taking this medication, and follow the directions you see here. Changed by: Brand Males Demarko Zeimet   bacitracin ophthalmic ointment Apply to eyelids at bedtime as needed. What changed: Another medication with the same name was removed. Continue taking this medication, and follow the directions you see here. Changed by: Rushie Chestnut   conjugated estrogens vaginal cream Commonly known as: PREMARIN Place 1 Applicatorful vaginally 2 (two) times a week. Place 0.5g nightly twice a week   cyanocobalamin 1000 MCG tablet Commonly known as: VITAMIN B12 Take 1,000 mcg by mouth daily.   cyclobenzaprine 5 MG tablet Commonly known as: FLEXERIL Take 1 tablet (5 mg total) by mouth at bedtime as needed for muscle spasms.   estradiol 0.1 MG/GM vaginal cream Commonly known as: ESTRACE Place 0.5 g vaginally 2 (two) times a week as directed   HYDROcodone-acetaminophen 10-325 MG tablet Commonly known as: NORCO Take 1 tablet by mouth every 6 (six) hours as needed.   ipratropium 0.06 % nasal spray Commonly known as: ATROVENT Place  2 sprays into both nostrils 3 (three) times daily.   lidocaine 4 % Place 1-3 patches onto the skin daily. Use for up to 12 hours in a 24 hour period   losartan 25 MG tablet Commonly known as: COZAAR Take 1 tablet (25 mg total) by mouth daily.   memantine 5 MG tablet Commonly known as: NAMENDA Take 1 tablet (5 mg total) by mouth at bedtime.   methenamine 1 g tablet Commonly known as: HIPREX Take 1 tablet (1 g total) by mouth 2 (two) times daily with a meal.   mirabegron ER 50 MG Tb24 tablet Commonly known as: MYRBETRIQ Take 1 tablet  (50 mg total) by mouth daily.   Multivitamin Adults Tabs Take 1 tablet by mouth daily.   neomycin-polymyxin b-dexamethasone 3.5-10000-0.1 Susp Commonly known as: MAXITROL   promethazine-dextromethorphan 6.25-15 MG/5ML syrup Commonly known as: PROMETHAZINE-DM Take 5 mLs by mouth 4 (four) times daily as needed.   valACYclovir 1000 MG tablet Commonly known as: Valtrex Take 1 tablet (1,000 mg total) by mouth 2 (two) times daily.   VITAMIN D2 PO   Vitamin D (Ergocalciferol) 1.25 MG (50000 UNIT) Caps capsule Commonly known as: DRISDOL Take 50,000 Units by mouth once a week.   Vitamin D3 1.25 MG (50000 UT) Caps Take 1 weekly        Allergies:  Allergies  Allergen Reactions   Morphine Nausea And Vomiting   Propoxyphene Nausea And Vomiting, Palpitations and Other (See Comments)    Darvocet Causes Sweats   Donepezil Diarrhea and Other (See Comments)   Morphine And Codeine Nausea And Vomiting   Tape Rash and Other (See Comments)    Past Medical History, Surgical history, Social history, and Family History were reviewed and updated.  Review of Systems: All other 10 point review of systems is negative.   Physical Exam:  height is 5\' 6"  (1.676 m) and weight is 212 lb (96.2 kg). Her oral temperature is 98 F (36.7 C). Her blood pressure is 135/46 (abnormal) and her pulse is 79. Her respiration is 18 and oxygen saturation is 96%.   Wt Readings from Last 3 Encounters:  12/21/22 212 lb (96.2 kg)  12/20/22 212 lb (96.2 kg)  11/15/22 212 lb 9.6 oz (96.4 kg)   Constitutional: Using a rolling walker  Ocular: Sclerae unicteric, pupils equal, round and reactive to light Ear-nose-throat: Oropharynx clear, dentition fair Lymphatic: No cervical or supraclavicular adenopathy Lungs no rales or rhonchi, good excursion bilaterally Heart regular rate and rhythm, no murmur appreciated Abd soft, nontender, positive bowel sounds MSK no focal spinal tenderness, no joint edema Neuro:  non-focal, well-oriented, appropriate affect Breasts: Deferred   Lab Results  Component Value Date   WBC 6.0 12/21/2022   HGB 13.0 12/21/2022   HCT 40.6 12/21/2022   MCV 91.9 12/21/2022   PLT 118 (L) 12/21/2022   Lab Results  Component Value Date   FERRITIN 37 03/09/2022   IRON 65 03/09/2022   TIBC 398 03/09/2022   UIBC 333 03/09/2022   IRONPCTSAT 16 03/09/2022   Lab Results  Component Value Date   RETICCTPCT 1.0 12/21/2022   RBC 4.38 12/21/2022   No results found for: "KPAFRELGTCHN", "LAMBDASER", "KAPLAMBRATIO" Lab Results  Component Value Date   IGGSERUM 1,085 02/09/2017   IGMSERUM 336 (H) 02/09/2017   No results found for: "TOTALPROTELP", "ALBUMINELP", "A1GS", "A2GS", "BETS", "BETA2SER", "GAMS", "MSPIKE", "SPEI"   Chemistry      Component Value Date/Time   NA 139 12/21/2022 1049   NA 139  02/25/2020 1627   K 4.2 12/21/2022 1049   CL 103 12/21/2022 1049   CO2 30 12/21/2022 1049   BUN 13 12/21/2022 1049   BUN 11 02/25/2020 1627   CREATININE 0.58 12/21/2022 1049      Component Value Date/Time   CALCIUM 9.7 12/21/2022 1049   ALKPHOS 104 12/21/2022 1049   AST 29 12/21/2022 1049   ALT 20 12/21/2022 1049   BILITOT 0.5 12/21/2022 1049       Impression and Plan: Crystal Lee is a very pleasant 89 yo caucasian female with history of bilateral stage Ia breast cancer diagnosed back in 1998. She has locally advanced colon cancer stage IIIb with 3+ lymph nodes. She completed all of her adjuvant therapy, with Xeloda, in July 2022.  Today her platelets are lower than normal. Question if this is reactive to her recent COVID-19 illness. No bleeding/bruising but we did discuss red flags and precautions. We will plan to recheck her in 1 month to ensure labs look better.   CEA pending. No sign of recurrence on exam or labs thus far. Last Imaging was completed on 09/29/2022 by Dr. Magnus Ivan which showed no sign of disease recurrence.   In terms of her Zometa we are holding this  until she has her dental examination and clearance tomorrow.   Disposition No Zometa today due to a loose tooth RTC 1 month APP, labs ( CBC w/, CMP, save smear) +- Zometa (yearly) Follow-up in 6 months MD   Rushie Chestnut, PA-C 7/23/202411:48 AM

## 2022-12-21 NOTE — Patient Instructions (Addendum)
You are on Zometa yearly  Please ask your dentist if you need any dental work completed as we plan to give you your Zometa at your next visit in 1 month.

## 2022-12-22 ENCOUNTER — Other Ambulatory Visit (HOSPITAL_COMMUNITY): Payer: Self-pay

## 2022-12-22 LAB — IRON AND IRON BINDING CAPACITY (CC-WL,HP ONLY)
Iron: 75 ug/dL (ref 28–170)
Saturation Ratios: 22 % (ref 10.4–31.8)
TIBC: 344 ug/dL (ref 250–450)
UIBC: 269 ug/dL (ref 148–442)

## 2022-12-23 ENCOUNTER — Ambulatory Visit: Payer: Medicare Other | Admitting: Family Medicine

## 2022-12-23 ENCOUNTER — Encounter: Payer: Self-pay | Admitting: Family Medicine

## 2022-12-23 VITALS — BP 135/64 | HR 78 | Temp 97.7°F | Ht 66.0 in | Wt 212.0 lb

## 2022-12-23 DIAGNOSIS — R350 Frequency of micturition: Secondary | ICD-10-CM | POA: Diagnosis not present

## 2022-12-23 DIAGNOSIS — R1030 Lower abdominal pain, unspecified: Secondary | ICD-10-CM

## 2022-12-23 DIAGNOSIS — U071 COVID-19: Secondary | ICD-10-CM | POA: Diagnosis not present

## 2022-12-23 LAB — DRUG TOX MONITOR 1 W/CONF, ORAL FLD
Amphetamines: NEGATIVE ng/mL (ref <10)
Barbiturates: NEGATIVE ng/mL (ref <10)
Buprenorphine: NEGATIVE ng/mL (ref <0.10)
Codeine: NEGATIVE ng/mL (ref <2.5)
Dihydrocodeine: 15.5 ng/mL — ABNORMAL HIGH (ref <2.5)
Fentanyl: NEGATIVE ng/mL (ref <0.10)
Hydrocodone: 88.4 ng/mL — ABNORMAL HIGH (ref <2.5)
Hydromorphone: NEGATIVE ng/mL (ref <2.5)
MARIJUANA: NEGATIVE ng/mL (ref <2.5)
MDMA: NEGATIVE ng/mL (ref <10)
Meprobamate: NEGATIVE ng/mL (ref <2.5)
Methadone: NEGATIVE ng/mL (ref <5.0)
Morphine: NEGATIVE ng/mL (ref <2.5)
Nicotine Metabolite: NEGATIVE ng/mL (ref <5.0)
Norhydrocodone: 6.9 ng/mL — ABNORMAL HIGH (ref <2.5)
Noroxycodone: NEGATIVE ng/mL (ref <2.5)
Oxycodone: NEGATIVE ng/mL (ref <2.5)
Oxymorphone: NEGATIVE ng/mL (ref <2.5)
Phencyclidine: NEGATIVE ng/mL (ref <10)
Tapentadol: NEGATIVE ng/mL (ref <5.0)
Zolpidem: NEGATIVE ng/mL (ref <5.0)

## 2022-12-23 LAB — POC URINALSYSI DIPSTICK (AUTOMATED)
Bilirubin, UA: NEGATIVE
Blood, UA: NEGATIVE
Glucose, UA: NEGATIVE
Ketones, UA: NEGATIVE
Nitrite, UA: NEGATIVE
Protein, UA: NEGATIVE
Spec Grav, UA: 1.01 (ref 1.010–1.025)
Urobilinogen, UA: 0.2 E.U./dL
pH, UA: 6.5 (ref 5.0–8.0)

## 2022-12-23 MED ORDER — CEPHALEXIN 500 MG PO CAPS
500.0000 mg | ORAL_CAPSULE | Freq: Two times a day (BID) | ORAL | 0 refills | Status: AC
Start: 2022-12-23 — End: 2022-12-30

## 2022-12-23 MED ORDER — PROMETHAZINE-DM 6.25-15 MG/5ML PO SYRP
5.0000 mL | ORAL_SOLUTION | Freq: Four times a day (QID) | ORAL | 0 refills | Status: DC | PRN
Start: 2022-12-23 — End: 2023-05-30

## 2022-12-23 NOTE — Progress Notes (Signed)
Acute Office Visit  Subjective:     Patient ID: Crystal Lee, female    DOB: 06-19-1933, 89 y.o.   MRN: [id number]  Chief Complaint  Patient presents with   Urinary Frequency     Patient is in today for urinary frequency.  Discussed the use of AI scribe software for clinical note transcription with the patient, who gave verbal consent to proceed.  History of Present Illness   The patient, with a history of urinary tract infections (UTIs), presents with urinary frequency and bladder pressure for the past couple of weeks. They deny dysuria but report discomfort in the lower abdominal region. They have experienced similar symptoms during previous UTI episodes. No changes in urine characteristics such as odor or color were reported.  The patient also reports chronic back pain. They deny any associated systemic symptoms such as fever, nausea, vomiting, or diarrhea. The patient recently recovered from COVID-19, which they believe was contracted during a flight. They report residual cough and fatigue post-infection. She is requesting a refill of the cough syrup.           All review of systems negative except what is listed in the HPI      Objective:    BP 135/64   Pulse 78   Temp 97.7 F (36.5 C) (Oral)   Ht 5\' 6"  (1.676 m)   Wt 212 lb (96.2 kg)   SpO2 97%   BMI 34.22 kg/m    Physical Exam Vitals reviewed.  Constitutional:      Appearance: Normal appearance.  Cardiovascular:     Rate and Rhythm: Normal rate and regular rhythm.     Heart sounds: Normal heart sounds.  Pulmonary:     Effort: Pulmonary effort is normal.     Breath sounds: Normal breath sounds.  Abdominal:     General: Abdomen is flat. Bowel sounds are normal. There is no distension.     Tenderness: There is no abdominal tenderness. There is no right CVA tenderness, left CVA tenderness or guarding.  Skin:    General: Skin is warm and dry.  Neurological:     Mental Status: She is alert and oriented  to person, place, and time.  Psychiatric:        Mood and Affect: Mood normal.        Behavior: Behavior normal.        Thought Content: Thought content normal.        Judgment: Judgment normal.     Results for orders placed or performed in visit on 12/23/22  POCT Urinalysis Dipstick (Automated)  Result Value Ref Range   Color, UA yellow    Clarity, UA clear    Glucose, UA Negative Negative   Bilirubin, UA negative    Ketones, UA negative    Spec Grav, UA 1.010 1.010 - 1.025   Blood, UA negative    pH, UA 6.5 5.0 - 8.0   Protein, UA Negative Negative   Urobilinogen, UA 0.2 0.2 or 1.0 E.U./dL   Nitrite, UA negative    Leukocytes, UA Small (1+) (A) Negative        Assessment & Plan:   Problem List Items Addressed This Visit   None Visit Diagnoses     Lower abdominal pain    -  Primary   Relevant Orders   POCT Urinalysis Dipstick (Automated) (Completed)   Urine Culture   Urinary frequency       Relevant Medications   cephALEXin (KEFLEX) 500 MG  capsule   Other Relevant Orders   POCT Urinalysis Dipstick (Automated) (Completed)   Urine Culture   COVID-19       Relevant Medications   cephALEXin (KEFLEX) 500 MG capsule   promethazine-dextromethorphan (PROMETHAZINE-DM) 6.25-15 MG/5ML syrup         Urinary Tract Infection (UTI): Presents with urinary frequency and bladder pressure for a couple of weeks. No dysuria, hematuria, or malodorous urine. Urinalysis shows leukocytes. History of recurrent UTIs. -Start Cephalexin (Keflex) empirically. Stay well hydrated -Send urine for culture and sensitivity. -If culture results indicate resistance to Cephalexin, adjust antibiotic accordingly. Patient will be contacted when results are in.  Post-Acute Sequelae of SARS-CoV-2 infection (PASC or Long COVID): Patient reports recent recovery from COVID-19 and is experiencing residual cough and fatigue. -Continue supportive care. -Refill Promethazine cough syrup.         Meds  ordered this encounter  Medications   cephALEXin (KEFLEX) 500 MG capsule    Sig: Take 1 capsule (500 mg total) by mouth 2 (two) times daily for 7 days.    Dispense:  14 capsule    Refill:  0    Order Specific Question:   Supervising Provider    Answer:   Danise Edge A [[id number]]   promethazine-dextromethorphan (PROMETHAZINE-DM) 6.25-15 MG/5ML syrup    Sig: Take 5 mLs by mouth 4 (four) times daily as needed.    Dispense:  118 mL    Refill:  0    Order Specific Question:   Supervising Provider    Answer:   Danise Edge A [[id number]]    Return if symptoms worsen or fail to improve.  Clayborne Dana, NP

## 2023-01-14 ENCOUNTER — Emergency Department (HOSPITAL_BASED_OUTPATIENT_CLINIC_OR_DEPARTMENT_OTHER): Payer: Medicare Other

## 2023-01-14 ENCOUNTER — Encounter: Payer: Self-pay | Admitting: Registered Nurse

## 2023-01-14 ENCOUNTER — Encounter (HOSPITAL_BASED_OUTPATIENT_CLINIC_OR_DEPARTMENT_OTHER): Payer: Self-pay

## 2023-01-14 ENCOUNTER — Emergency Department (HOSPITAL_BASED_OUTPATIENT_CLINIC_OR_DEPARTMENT_OTHER)
Admission: EM | Admit: 2023-01-14 | Discharge: 2023-01-14 | Disposition: A | Discharge status: Home / Self Care | Payer: Medicare Other | Attending: Emergency Medicine | Admitting: Emergency Medicine

## 2023-01-14 ENCOUNTER — Other Ambulatory Visit: Payer: Self-pay

## 2023-01-14 ENCOUNTER — Encounter: Payer: Medicare Other | Attending: Physical Medicine & Rehabilitation | Admitting: Registered Nurse

## 2023-01-14 VITALS — BP 148/72 | HR 87 | Ht 66.0 in | Wt 213.0 lb

## 2023-01-14 DIAGNOSIS — M2550 Pain in unspecified joint: Secondary | ICD-10-CM | POA: Diagnosis not present

## 2023-01-14 DIAGNOSIS — R0602 Shortness of breath: Secondary | ICD-10-CM | POA: Diagnosis not present

## 2023-01-14 DIAGNOSIS — M62838 Other muscle spasm: Secondary | ICD-10-CM | POA: Diagnosis not present

## 2023-01-14 DIAGNOSIS — M25511 Pain in right shoulder: Secondary | ICD-10-CM | POA: Insufficient documentation

## 2023-01-14 DIAGNOSIS — M7061 Trochanteric bursitis, right hip: Secondary | ICD-10-CM | POA: Diagnosis not present

## 2023-01-14 DIAGNOSIS — R202 Paresthesia of skin: Secondary | ICD-10-CM | POA: Diagnosis not present

## 2023-01-14 DIAGNOSIS — R531 Weakness: Secondary | ICD-10-CM | POA: Diagnosis not present

## 2023-01-14 DIAGNOSIS — R7309 Other abnormal glucose: Secondary | ICD-10-CM | POA: Diagnosis not present

## 2023-01-14 DIAGNOSIS — R002 Palpitations: Secondary | ICD-10-CM | POA: Insufficient documentation

## 2023-01-14 DIAGNOSIS — J40 Bronchitis, not specified as acute or chronic: Secondary | ICD-10-CM | POA: Insufficient documentation

## 2023-01-14 DIAGNOSIS — Z853 Personal history of malignant neoplasm of breast: Secondary | ICD-10-CM | POA: Insufficient documentation

## 2023-01-14 DIAGNOSIS — Z5181 Encounter for therapeutic drug level monitoring: Secondary | ICD-10-CM | POA: Insufficient documentation

## 2023-01-14 DIAGNOSIS — Z79891 Long term (current) use of opiate analgesic: Secondary | ICD-10-CM | POA: Insufficient documentation

## 2023-01-14 DIAGNOSIS — M546 Pain in thoracic spine: Secondary | ICD-10-CM | POA: Insufficient documentation

## 2023-01-14 DIAGNOSIS — M170 Bilateral primary osteoarthritis of knee: Secondary | ICD-10-CM | POA: Insufficient documentation

## 2023-01-14 DIAGNOSIS — I700 Atherosclerosis of aorta: Secondary | ICD-10-CM | POA: Diagnosis not present

## 2023-01-14 DIAGNOSIS — R7401 Elevation of levels of liver transaminase levels: Secondary | ICD-10-CM | POA: Insufficient documentation

## 2023-01-14 DIAGNOSIS — Z7982 Long term (current) use of aspirin: Secondary | ICD-10-CM | POA: Diagnosis not present

## 2023-01-14 DIAGNOSIS — S32000S Wedge compression fracture of unspecified lumbar vertebra, sequela: Secondary | ICD-10-CM | POA: Diagnosis not present

## 2023-01-14 DIAGNOSIS — M7062 Trochanteric bursitis, left hip: Secondary | ICD-10-CM | POA: Diagnosis not present

## 2023-01-14 DIAGNOSIS — Z79899 Other long term (current) drug therapy: Secondary | ICD-10-CM | POA: Diagnosis not present

## 2023-01-14 DIAGNOSIS — G894 Chronic pain syndrome: Secondary | ICD-10-CM | POA: Diagnosis not present

## 2023-01-14 DIAGNOSIS — Z8616 Personal history of COVID-19: Secondary | ICD-10-CM | POA: Diagnosis not present

## 2023-01-14 DIAGNOSIS — G8929 Other chronic pain: Secondary | ICD-10-CM | POA: Insufficient documentation

## 2023-01-14 DIAGNOSIS — M7989 Other specified soft tissue disorders: Secondary | ICD-10-CM | POA: Diagnosis not present

## 2023-01-14 DIAGNOSIS — N3000 Acute cystitis without hematuria: Secondary | ICD-10-CM | POA: Insufficient documentation

## 2023-01-14 DIAGNOSIS — I517 Cardiomegaly: Secondary | ICD-10-CM | POA: Diagnosis not present

## 2023-01-14 LAB — CBC WITH DIFFERENTIAL/PLATELET
Abs Immature Granulocytes: 0.01 10*3/uL (ref 0.00–0.07)
Basophils Absolute: 0 10*3/uL (ref 0.0–0.1)
Basophils Relative: 0 %
Eosinophils Absolute: 0.2 10*3/uL (ref 0.0–0.5)
Eosinophils Relative: 3 %
HCT: 40.2 % (ref 36.0–46.0)
Hemoglobin: 13 g/dL (ref 12.0–15.0)
Immature Granulocytes: 0 %
Lymphocytes Relative: 29 %
Lymphs Abs: 1.9 10*3/uL (ref 0.7–4.0)
MCH: 29.5 pg (ref 26.0–34.0)
MCHC: 32.3 g/dL (ref 30.0–36.0)
MCV: 91.4 fL (ref 80.0–100.0)
Monocytes Absolute: 0.5 10*3/uL (ref 0.1–1.0)
Monocytes Relative: 7 %
Neutro Abs: 4 10*3/uL (ref 1.7–7.7)
Neutrophils Relative %: 61 %
Platelets: 123 10*3/uL — ABNORMAL LOW (ref 150–400)
RBC: 4.4 MIL/uL (ref 3.87–5.11)
RDW: 13.8 % (ref 11.5–15.5)
WBC: 6.5 10*3/uL (ref 4.0–10.5)
nRBC: 0 % (ref 0.0–0.2)

## 2023-01-14 LAB — COMPREHENSIVE METABOLIC PANEL
ALT: 34 U/L (ref 0–44)
AST: 45 U/L — ABNORMAL HIGH (ref 15–41)
Albumin: 3.9 g/dL (ref 3.5–5.0)
Alkaline Phosphatase: 103 U/L (ref 38–126)
Anion gap: 9 (ref 5–15)
BUN: 14 mg/dL (ref 8–23)
CO2: 24 mmol/L (ref 22–32)
Calcium: 9.5 mg/dL (ref 8.9–10.3)
Chloride: 103 mmol/L (ref 98–111)
Creatinine, Ser: 0.59 mg/dL (ref 0.44–1.00)
GFR, Estimated: >60 mL/min (ref >60)
Glucose, Bld: 112 mg/dL — ABNORMAL HIGH (ref 70–99)
Potassium: 4 mmol/L (ref 3.5–5.1)
Sodium: 136 mmol/L (ref 135–145)
Total Bilirubin: 0.6 mg/dL (ref 0.3–1.2)
Total Protein: 7.6 g/dL (ref 6.5–8.1)

## 2023-01-14 LAB — URINALYSIS, ROUTINE W REFLEX MICROSCOPIC
Bilirubin Urine: NEGATIVE
Glucose, UA: NEGATIVE mg/dL
Ketones, ur: NEGATIVE mg/dL
Nitrite: POSITIVE — AB
Protein, ur: NEGATIVE mg/dL
Specific Gravity, Urine: 1.015 (ref 1.005–1.030)
pH: 7 (ref 5.0–8.0)

## 2023-01-14 LAB — URINALYSIS, MICROSCOPIC (REFLEX)

## 2023-01-14 LAB — TROPONIN I (HIGH SENSITIVITY)
Troponin I (High Sensitivity): 5 ng/L (ref <18)
Troponin I (High Sensitivity): 7 ng/L (ref <18)

## 2023-01-14 LAB — LACTIC ACID, PLASMA: Lactic Acid, Venous: 1.1 mmol/L (ref 0.5–1.9)

## 2023-01-14 MED ORDER — CIPROFLOXACIN HCL 500 MG PO TABS: 500.0000 mg | ORAL_TABLET | Freq: Two times a day (BID) | ORAL | 0 refills | Status: AC

## 2023-01-14 MED ORDER — CIPROFLOXACIN HCL 500 MG PO TABS
500.0000 mg | ORAL_TABLET | Freq: Once | ORAL | Status: AC
  Administered 2023-01-14: 500 mg via ORAL
  Filled 2023-01-14: qty 1

## 2023-01-14 MED ORDER — HYDROCODONE-ACETAMINOPHEN 10-325 MG PO TABS: 1.0000 | ORAL_TABLET | Freq: Four times a day (QID) | ORAL | 0 refills | Status: DC | PRN

## 2023-01-14 MED ORDER — ALBUTEROL SULFATE HFA 108 (90 BASE) MCG/ACT IN AERS
1.0000 | INHALATION_SPRAY | Freq: Once | RESPIRATORY_TRACT | Status: AC
  Administered 2023-01-14: 2 via RESPIRATORY_TRACT
  Filled 2023-01-14: qty 6.7

## 2023-01-14 MED ORDER — PREDNISONE 20 MG PO TABS
20.0000 mg | ORAL_TABLET | Freq: Once | ORAL | Status: AC
  Administered 2023-01-14: 20 mg via ORAL
  Filled 2023-01-14: qty 1

## 2023-01-14 MED ORDER — SODIUM CHLORIDE 0.9 % IV BOLUS
500.0000 mL | Freq: Once | INTRAVENOUS | Status: AC
  Administered 2023-01-14: 500 mL via INTRAVENOUS

## 2023-01-14 MED ORDER — ALBUTEROL SULFATE HFA 108 (90 BASE) MCG/ACT IN AERS: 1.0000 | INHALATION_SPRAY | Freq: Four times a day (QID) | RESPIRATORY_TRACT | 0 refills | Status: DC | PRN

## 2023-01-14 MED ORDER — IOHEXOL 350 MG/ML SOLN
100.0000 mL | Freq: Once | INTRAVENOUS | Status: AC | PRN
  Administered 2023-01-14: 100 mL via INTRAVENOUS

## 2023-01-14 MED ORDER — PREDNISONE 10 MG PO TABS: 20.0000 mg | ORAL_TABLET | Freq: Every day | ORAL | 0 refills | Status: DC

## 2023-01-14 NOTE — ED Provider Notes (Signed)
Accepted handoff at shift change from [facility], PA-C. Please see prior provider note for more detail.   Briefly: Patient is 89 y.o. presenting to the ED with dyspnea.  She is maintaining oxygen saturation on room air and had a normal oxygen saturation with ambulation.  First troponin is normal.  Pending repeat troponin.  If this is normal, patient can be discharged home per previous provider.  I rechecked, patient speaking full sentences with ease.  Lungs clear to auscultation, no respiratory distress.  Chest x-ray does have findings of bronchitis.  No history of diabetes.  She has not been on any antibiotics or steroids recently.  With findings of bronchitis and patient's subjective report of dyspnea, will give low-dose steroids considering age and have follow-up closely with PCP.  Urine does also appear acutely mildly infected.  Patient has a history of recurrent UTIs that are difficult to treat.  States that she has been on Macrobid and Keflex in the past but these do not always treat her UTIs.  States that she usually has success with Cipro.  Will place her on this at home for treatment of her UTI and again have her follow-up closely with primary care.  Discussed all findings with patient and she is agreeable with plan.  She has follow-up scheduled for Monday.  Given first dose of medications in the ED tonight and will pick up prescription tomorrow.  Discussed strict ED return precautions and patient will return if needed.  All questions answered and patient stable for discharge.   Richardson Dopp 01/14/23 Eliberto Ivory, MD 01/14/23 2212

## 2023-01-14 NOTE — Discharge Instructions (Addendum)
Thank you for letting us take care of you today.  Your heart enzymes were normal.  Your chest x-ray showed changes of bronchitis.  Your urine appeared mildly infected.  You were able to maintain your oxygen saturation without supplemental oxygen.  We do not see any bacterial infection on her chest x-ray.  Overall, the rest of your labs were reassuring.  We will treat you for bronchitis and a UTI.  We gave you the first dose of your medications in the ED tonight.  You can start the prescription medications tomorrow.  We will put you on a low-dose of steroids and prescribe an inhaler to help with your bronchitis.  Since you have had difficulty with treating your UTIs in the past, we will put you on Cipro for this.  Follow-up closely with your PCP and be rechecked next week.  If you develop any new or worsening symptoms, return to the nearest ED for reevaluation.

## 2023-01-14 NOTE — ED Triage Notes (Signed)
The patient had covid on July 9th. She stated she is now having headache, shortness of breath and weakness.

## 2023-01-14 NOTE — ED Notes (Signed)
Pt. Ambulated from restroom after urinating with oxy sat. Staying at 98%  on RA.

## 2023-01-14 NOTE — Progress Notes (Signed)
Subjective:    Patient ID: Crystal Lee, female    DOB: 02-26-1934, 89 y.o.   MRN: [id number]  HPI: Crystal Lee is a 89 y.o. female who returns for follow up appointment for chronic pain and medication refill. She states her pain is located in her mid- lower back, bilateral hips, bilateral lower extremities with tinglinng and generalized joint pain. She rates her pain 4. Her current exercise regime is walking and performing stretching exercises.  [name] reports she was hanging clothes in the closet two weeks ago, and missed the rod and fell against the wall hitting her right shoulder, she denies falling or losing her balance. She refuses X-ray at this time. She will call office if she changes her mind about the X-ray, she verbalizes understanding.   [name] Morphine equivalent is 40.00 MME.   Last Oral Swab was Performed on 12/20/2022, it was consistent.    Pain Inventory Average Pain 4 Pain Right Now 4 My pain is constant, sharp, burning, tingling, and aching  In the last 24 hours, has pain interfered with the following? General activity 5 Relation with others 5 Enjoyment of life 5 What TIME of day is your pain at its worst? evening Sleep (in general) Poor  Pain is worse with: walking, bending, and standing Pain improves with: rest, heat/ice, and medication Relief from Meds: 3  Family History  Problem Relation Age of Onset   Other Mother        complications from flu   Other Father        unsure of cause   Colon cancer Neg Hx    Social History   Socioeconomic History   Marital status: Widowed    Spouse name: Not on file   Number of children: 3   Years of education: 16 years   Highest education level: Not on file  Occupational History   Occupation: Retired  Tobacco Use   Smoking status: Former    Current packs/day: 0.00    Average packs/day: 0.5 packs/day for 20.0 years (10.0 ttl pk-yrs)    Types: Cigarettes    Start date: 02/03/1956    Quit date: 02/03/1976     Years since quitting: 46.9   Smokeless tobacco: Never  Vaping Use   Vaping status: Never Used  Substance and Sexual Activity   Alcohol use: No   Drug use: No   Sexual activity: Not Currently    Birth control/protection: Post-menopausal  Other Topics Concern   Not on file  Social History Narrative   Lives at home with husband.   Right-handed.   No caffeine use.   Social Determinants of Health   Financial Resource Strain: Low Risk  (08/07/2021)   Overall Financial Resource Strain (CARDIA)    Difficulty of Paying Living Expenses: Not hard at all  Food Insecurity: No Food Insecurity (08/31/2022)   Hunger Vital Sign    Worried About Running Out of Food in the Last Year: Never true    Ran Out of Food in the Last Year: Never true  Transportation Needs: No Transportation Needs (08/31/2022)   PRAPARE - Administrator, Civil Service (Medical): No    Lack of Transportation (Non-Medical): No  Physical Activity: Inactive (08/31/2022)   Exercise Vital Sign    Days of Exercise per Week: 0 days    Minutes of Exercise per Session: 0 min  Stress: No Stress Concern Present (08/31/2022)   [name] of Occupational Health - Occupational Stress Questionnaire  Feeling of Stress : Not at all  Social Connections: Moderately Isolated (08/07/2021)   Social Connection and Isolation Panel [NHANES]    Frequency of Communication with Friends and Family: More than three times a week    Frequency of Social Gatherings with Friends and Family: More than three times a week    Attends Religious Services: More than 4 times per year    Active Member of [employer] or Organizations: No    Attends Banker Meetings: Never    Marital Status: Widowed   Past Surgical History:  Procedure Laterality Date   13 HOUR PH STUDY N/A 06/21/2018   Procedure: 24 HOUR PH STUDY;  Surgeon: Shellia Cleverly, DO;  Location: [facility];  Service: Gastroenterology;  Laterality: N/A;  with impedance    ABDOMINAL HYSTERECTOMY  2010   ANAL RECTAL MANOMETRY N/A 09/19/2019   Procedure: ANO RECTAL MANOMETRY;  Surgeon: Napoleon Form, MD;  Location: [facility];  Service: Endoscopy;  Laterality: N/A;   APPENDECTOMY  1949   BACK SURGERY  1973   BIOPSY  06/10/2020   Procedure: BIOPSY;  Surgeon: Shellia Cleverly, DO;  Location: [facility];  Service: Gastroenterology;;  EGD and COLON   BREAST SURGERY     CATARACT EXTRACTION Bilateral 2011   CHOLECYSTECTOMY     COLON RESECTION N/A 06/12/2020   Procedure: LAPAROSCOPIC COLON RESECTION;  Surgeon: Abigail Miyamoto, MD;  Location: [facility];  Service: General;  Laterality: N/A;   COLONOSCOPY WITH PROPOFOL N/A 06/10/2020   Procedure: COLONOSCOPY WITH PROPOFOL;  Surgeon: Shellia Cleverly, DO;  [facility]: [facility];  Service: Gastroenterology;  Laterality: N/A;   ESOPHAGEAL MANOMETRY N/A 06/21/2018   Procedure: ESOPHAGEAL MANOMETRY (EM);  Surgeon: Shellia Cleverly, DO;  [facility]: [facility];  Service: Gastroenterology;  Laterality: N/A;   ESOPHAGOGASTRODUODENOSCOPY (EGD) WITH PROPOFOL N/A 06/10/2020   Procedure: ESOPHAGOGASTRODUODENOSCOPY (EGD) WITH PROPOFOL;  Surgeon: Shellia Cleverly, DO;  [facility];  Service: Gastroenterology;  Laterality: N/A;   GALLBLADDER SURGERY  2015   INCISIONAL HERNIA REPAIR N/A 09/16/2021   Procedure: OPEN INCISIONAL HERNIA REPAIR WITH MESH;  Surgeon: Abigail Miyamoto, MD;  Location: [facility];  Service: General;  Laterality: N/A;   JOINT REPLACEMENT     RT TOTAL HIP / RT TOTAL KNEE   MASTECTOMY  1998   BILATERAL    PH IMPEDANCE STUDY  06/21/2018   Procedure: PH IMPEDANCE STUDY;  Surgeon: Shellia Cleverly, DO;  [facility]: [facility];  Service: Gastroenterology;;   POLYPECTOMY  06/10/2020   Procedure: POLYPECTOMY;  Surgeon: Shellia Cleverly, DO;  [facility];  Service: Gastroenterology;;   SKIN CANCER EXCISION  2016   RT SIDE OF NOSE   SUBMUCOSAL TATTOO INJECTION  06/10/2020   Procedure:  SUBMUCOSAL TATTOO INJECTION;  Surgeon: Shellia Cleverly, DO;  [facility];  Service: Gastroenterology;;   TONSILLECTOMY  1953   TOTAL HIP ARTHROPLASTY  2010   RIGHT   TOTAL HIP ARTHROPLASTY Left 05/09/2015   Procedure: LEFT TOTAL HIP ARTHROPLASTY ANTERIOR APPROACH;  Surgeon: Ollen Gross, MD;  Location: [facility];  Service: Orthopedics;  Laterality: Left;   TOTAL KNEE ARTHROPLASTY  2001   TUMOR REMOVAL  2012   ABDOMINAL - NON CANCEROUS   Past Surgical History:  Procedure Laterality Date   92 HOUR PH STUDY N/A 06/21/2018   Procedure: 24 HOUR PH STUDY;  Surgeon: Shellia Cleverly, DO;  [facility];  Service: Gastroenterology;  Laterality: N/A;  with impedance   ABDOMINAL HYSTERECTOMY  2010   ANAL RECTAL MANOMETRY N/A 09/19/2019   Procedure: ANO RECTAL MANOMETRY;  Surgeon: Napoleon Form, MD;  [facility];  Service: Endoscopy;  Laterality: N/A;   APPENDECTOMY  1949   BACK SURGERY  1973   BIOPSY  06/10/2020   Procedure: BIOPSY;  Surgeon: Shellia Cleverly, DO;  [facility];  Service: Gastroenterology;;  EGD and COLON   BREAST SURGERY     CATARACT EXTRACTION Bilateral 2011   CHOLECYSTECTOMY     COLON RESECTION N/A 06/12/2020   Procedure: LAPAROSCOPIC COLON RESECTION;  Surgeon: Abigail Miyamoto, MD;  [facility];  Service: General;  Laterality: N/A;   COLONOSCOPY WITH PROPOFOL N/A 06/10/2020   Procedure: COLONOSCOPY WITH PROPOFOL;  Surgeon: Shellia Cleverly, DO;  [facility];  Service: Gastroenterology;  Laterality: N/A;   ESOPHAGEAL MANOMETRY N/A 06/21/2018   Procedure: ESOPHAGEAL MANOMETRY (EM);  Surgeon: Shellia Cleverly, DO;  [facility];  Service: Gastroenterology;  Laterality: N/A;   ESOPHAGOGASTRODUODENOSCOPY (EGD) WITH PROPOFOL N/A 06/10/2020   Procedure: ESOPHAGOGASTRODUODENOSCOPY (EGD) WITH PROPOFOL;  Surgeon: Shellia Cleverly, DO;  [facility];  Service: Gastroenterology;  Laterality: N/A;    GALLBLADDER SURGERY  2015   INCISIONAL HERNIA REPAIR N/A 09/16/2021   Procedure: OPEN INCISIONAL HERNIA REPAIR WITH MESH;  Surgeon: Abigail Miyamoto, MD;  [facility];  Service: General;  Laterality: N/A;   JOINT REPLACEMENT     RT TOTAL HIP / RT TOTAL KNEE   MASTECTOMY  1998   BILATERAL    PH IMPEDANCE STUDY  06/21/2018   Procedure: PH IMPEDANCE STUDY;  Surgeon: Shellia Cleverly, DO;  [facility];  Service: Gastroenterology;;   POLYPECTOMY  06/10/2020   Procedure: POLYPECTOMY;  Surgeon: Shellia Cleverly, DO;  [facility];  Service: Gastroenterology;;   SKIN CANCER EXCISION  2016   RT SIDE OF NOSE   SUBMUCOSAL TATTOO INJECTION  06/10/2020   Procedure: SUBMUCOSAL TATTOO INJECTION;  Surgeon: Shellia Cleverly, DO;  [facility];  Service: Gastroenterology;;   TONSILLECTOMY  1953   TOTAL HIP ARTHROPLASTY  2010   RIGHT   TOTAL HIP ARTHROPLASTY Left 05/09/2015   Procedure: LEFT TOTAL HIP ARTHROPLASTY ANTERIOR APPROACH;  Surgeon: Ollen Gross, MD;  Location: [facility];  Service: Orthopedics;  Laterality: Left;   TOTAL KNEE ARTHROPLASTY  2001   TUMOR REMOVAL  2012   ABDOMINAL - NON CANCEROUS   Past Medical History:  Diagnosis Date   Arthritis    Cancer (HCC)    HX BREAST CANCER/ SKIN CANCER   Colon cancer (HCC)    Complication of anesthesia    N/V WITH MORPHINE   Difficulty sleeping    Fractured hip (HCC)    LEFT - AUG 2016   GERD (gastroesophageal reflux disease)    Hyperlipidemia    Hypertension    Melanoma (HCC)    Neuropathy    Nocturia    Osteopenia    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   PONV (postoperative nausea and vomiting)    PT STATES MORPHINE CAUSED N/V   Rosacea    Sleep apnea    Stage 1 breast cancer, ER+, right (HCC) 07/04/2017   Ht 5\' 6"  (1.676 m)   Wt 213 lb (96.6 kg)   BMI 34.38 kg/m   Opioid Risk Score:   Fall Risk Score:  `1  Depression screen [facility] 2/9     12/20/2022   11:43 AM 11/15/2022    11:26 AM 10/21/2022   11:16  AM 09/06/2022    3:07 PM 08/31/2022    1:08 PM 08/16/2022   11:13 AM 07/05/2022   11:17 AM  Depression screen PHQ 2/9  Decreased Interest 0 0 0 0 0 0 0  Down, Depressed, Hopeless 0 0 0 0 0 0 0  PHQ - 2 Score 0 0 0 0 0 0 0      Review of Systems  Musculoskeletal:  Positive for back pain.       B/L leg foot pain  All other systems reviewed and are negative.      Objective:   Physical Exam Vitals and nursing note reviewed.  Constitutional:      Appearance: Normal appearance.  Cardiovascular:     Rate and Rhythm: Normal rate and regular rhythm.     Pulses: Normal pulses.     Heart sounds: Normal heart sounds.  Pulmonary:     Effort: Pulmonary effort is normal.     Breath sounds: Normal breath sounds.  Musculoskeletal:     Cervical back: Normal range of motion and neck supple.     Comments: Normal Muscle Bulk and Muscle Testing Reveals:  Upper Extremities:Right: Decreased ROM 45 Degrees and Muscle Strength 5/5 Left Upper Extremity: Full ROM and Muscle strength 5/5  Thoracic Paraspinal Tenderness: T-1-T-3 Mainly Right Side   Lumbar Paraspinal Tenderness: L-4-L-5 Lower Extremities: Full ROM and Muscle Strength 5/5 Arises from Chair slowly using walker for support Narrow Based Gait     Skin:    General: Skin is warm and dry.  Neurological:     Mental Status: She is alert and oriented to person, place, and time.  Psychiatric:        Mood and Affect: Mood normal.        Behavior: Behavior normal.         Assessment & Plan:  1. Chronic Bilateral Leg pain: Paresthesia: Continue to Monitor. 01/14/2023 2. Paresthesia Salli Real Radiculitis: Ms. Chhay has weaned herself off the Lyrica due to daytime drowsiness. We will continue to monitor. 01/14/2023 3. Pain of Left Wrist/ : No complaints today. S/P Carpal Tunnel Release on 06/03/2017 by Dr. Danielle Dess. Dr. Danielle Dess Following. 01/14/2023. 4. Fracture of superior pubic ramus.  Dr. Lequita Halt Following.  Continue to  monitor. 01/14/2023. 5. Bilateral Knee OA: Continue Voltaren Gel.  Continue to monitor. Orthopedist following. 01/14/2023 6. Polyarthralgia: Continue to alternate with heat and ice therapy. Continue current medication regime. Continue to monitor. 01/14/2023. 7. Chronic Pain Syndrome: Refilled::Hydrocodone  10/325 one tablet 4 times a day as needed for pain #120. We will continue the opioid monitoring program, this consists of regular clinic visits, examinations, urine drug screen, pill counts as well as use of [place] Controlled Substance Reporting system. A 12 month History has been reviewed on the [place] Controlled Substance Reporting System on 01/14/2023. 8. Lumbar Compression Fracture L2 and L3:  Ms. Horacek refused physical therapy. Continue with rest/ heat therapy. Continue to Monitor 01/14/2023. 9. Muscle Spasm: Continue  Flexeril 5 mg at HS. Continue to monitor. 01/14/2023 10. Right Shoulder Pain: Refuses X-ray at this time: Will call office if she changes her mind, she verbalizes understanding. Continue HEP as Tolerated. Alternate Ice and Heat Therapy. 01/14/2023  11. Greater Trochanter Bursitis:  Continue to Monitor. Continue HEP as Tolerated. 01/14/2023  12. Near Miss Fall:Ms. Eyestone denies falling or losing her balance:  Educated on falls Prevention. She verbalizes understanding.   F/U in 1 Month

## 2023-01-14 NOTE — ED Notes (Signed)
Pt. Up to rest room off monitor and walked with her walker with no trouble

## 2023-01-14 NOTE — ED Provider Notes (Signed)
EMERGENCY DEPARTMENT AT [facility] Provider Note   CSN: [id number] Arrival date & time: 01/14/23  1237     History  Chief Complaint  Patient presents with   Shortness of Breath    Crystal Lee is a 89 y.o. female with a past medical history of***who presents emergency department with concerns for shortness of breath onset 12/07/22. Notes that her shortness of breath has worsened after she was diagnosed with COVID and worse with exertion. Also notes palpitations, LLE swelling. Notes a headache that has been daily since she was diagnosed with COVID on 7/9th. No meds tried. No thinners. Denies chest pain, numbness, tingling, vision changes. Pt denies recent travel, immobilization, surgery, estrogen use, birth control use, or PMHx of PE/DVT. History of breast cancer with bilateral mastectomy in the late 90s.    The history is provided by the patient. No language interpreter was used.       Home Medications Prior to Admission medications   Medication Sig Start Date End Date Taking? Authorizing Provider  aspirin EC 81 MG tablet Take 81 mg by mouth daily. Swallow whole.    [provider]  atorvastatin (LIPITOR) 20 MG tablet Take 1 tablet (20 mg total) by mouth daily. 09/06/22   Copland, Gwenlyn Found, MD  bacitracin ophthalmic ointment Apply to eyelids at bedtime as needed. 09/06/22   Copland, Gwenlyn Found, MD  Cholecalciferol (VITAMIN D3) 1.25 MG (50000 UT) CAPS Take 1 weekly 11/03/21   Copland, Gwenlyn Found, MD  conjugated estrogens (PREMARIN) vaginal cream Place 1 Applicatorful vaginally 2 (two) times a week. Place 0.5g nightly twice a week 08/09/22   Marguerita Beards, MD  cyclobenzaprine (FLEXERIL) 5 MG tablet Take 1 tablet (5 mg total) by mouth at bedtime as needed for muscle spasms. 09/07/22   Jones Bales, NP  Ergocalciferol (VITAMIN D2 PO)  10/21/21   [provider]  estradiol (ESTRACE) 0.1 MG/GM vaginal cream Place 0.5 g vaginally 2 (two) times a week  as directed 06/03/22   Marguerita Beards, MD  HYDROcodone-acetaminophen (NORCO) 10-325 MG tablet Take 1 tablet by mouth every 6 (six) hours as needed. 01/14/23   Jones Bales, NP  ipratropium (ATROVENT) 0.06 % nasal spray Place 2 sprays into both nostrils 3 (three) times daily. 06/17/22     lidocaine 4 % Place 1-3 patches onto the skin daily. Use for up to 12 hours in a 24 hour period 09/13/22   Copland, Gwenlyn Found, MD  losartan (COZAAR) 25 MG tablet Take 1 tablet (25 mg total) by mouth daily. 09/06/22   Copland, Gwenlyn Found, MD  memantine (NAMENDA) 5 MG tablet Take 1 tablet (5 mg total) by mouth at bedtime. 07/12/22   Marcos Eke, PA-C  methenamine (HIPREX) 1 g tablet Take 1 tablet (1 g total) by mouth 2 (two) times daily with a meal. 11/03/21   Marguerita Beards, MD  mirabegron ER (MYRBETRIQ) 50 MG TB24 tablet Take 1 tablet (50 mg total) by mouth daily. 08/09/22   Marguerita Beards, MD  Multiple Vitamins-Minerals (MULTIVITAMIN ADULTS) TABS Take 1 tablet by mouth daily.    [provider]  neomycin-polymyxin b-dexamethasone (MAXITROL) 3.5-10000-0.1 SUSP  09/06/22   [provider]  promethazine-dextromethorphan (PROMETHAZINE-DM) 6.25-15 MG/5ML syrup Take 5 mLs by mouth 4 (four) times daily as needed. 12/23/22   Clayborne Dana, NP  valACYclovir (VALTREX) 1000 MG tablet Take 1 tablet (1,000 mg total) by mouth 2 (two) times daily. 02/24/22  vitamin B-12 (CYANOCOBALAMIN) 1000 MCG tablet Take 1,000 mcg by mouth daily.    [provider]  Vitamin D, Ergocalciferol, (DRISDOL) 1.25 MG (50000 UNIT) CAPS capsule Take 50,000 Units by mouth once a week. 10/15/22   [provider]      Allergies    Morphine, Propoxyphene, Donepezil, Morphine and codeine, and Tape    Review of Systems   Review of Systems  Respiratory:  Positive for shortness of breath.   All other systems reviewed and are negative.   Physical Exam Updated Vital Signs BP (!) 147/76   Pulse (!)  110   Temp 97.8 F (36.6 C) (Oral)   Resp (!) 26   Ht 5\' 6"  (1.676 m)   Wt 96.6 kg   SpO2 98%   BMI 34.38 kg/m  Physical Exam Vitals and nursing note reviewed.  Constitutional:      General: She is not in acute distress.    Appearance: She is not diaphoretic.  HENT:     Head: Normocephalic and atraumatic.     Mouth/Throat:     Pharynx: No oropharyngeal exudate.  Eyes:     General: No scleral icterus.    Conjunctiva/sclera: Conjunctivae normal.  Cardiovascular:     Rate and Rhythm: Normal rate and regular rhythm.     Pulses: Normal pulses.     Heart sounds: Normal heart sounds.  Pulmonary:     Effort: Pulmonary effort is normal. No respiratory distress.     Breath sounds: Normal breath sounds. No wheezing.  Abdominal:     General: Bowel sounds are normal.     Palpations: Abdomen is soft. There is no mass.     Tenderness: There is no abdominal tenderness. There is no guarding or rebound.  Musculoskeletal:        General: Normal range of motion.     Cervical back: Normal range of motion and neck supple.     Comments: No appreciable pitting edema noted to BLE.  Skin:    General: Skin is warm and dry.  Neurological:     Mental Status: She is alert.     Comments: No focal neurological deficits. Negative pronator drift. Able to ambulate without assistance or difficulty. Strength and sensation intact to BUE and BLE. Grip strength 5/5 bilaterally.  Normal finger-nose testing.  Normal heel-to-shin testing.  Cranial nerves II through XII intact.  Psychiatric:        Behavior: Behavior normal.     ED Results / Procedures / Treatments   Labs (all labs ordered are listed, but only abnormal results are displayed) Labs Reviewed  LACTIC ACID, PLASMA  LACTIC ACID, PLASMA  COMPREHENSIVE METABOLIC PANEL  CBC WITH DIFFERENTIAL/PLATELET  URINALYSIS, ROUTINE W REFLEX MICROSCOPIC    EKG None  Radiology No results found.  Procedures Procedures    Medications Ordered in  ED Medications - No data to display  ED Course/ Medical Decision Making/ A&P Clinical Course as of 01/14/23 1858  Fri Jan 14, 2023  1845 SOB workup negative. [CC]    Clinical Course User Index [CC] Glyn Ade, MD                                 Medical Decision Making Amount and/or Complexity of Data Reviewed Labs: ordered. Radiology: ordered.  Risk Prescription drug management.   Patient presents to the ED complaining of shortness of breath onset ***.  Vital signs ***. {Patient overall  well-appearing during exam today}.  {Patient does not wear oxygen at baseline}.  On exam patient with ***. *** No acute cardiovascular, respiratory, abdominal exam findings.  *** No pitting edema noted bilaterally. Recent echocardiogram on *** showed EF of ***%. Differential diagnosis includes CHF exacerbation, ACS, PTX, PNA.   Co morbidities that complicate the patient evaluation: ***  Additional history obtained:  Additional history obtained from {sabhistory:27144} External records from outside source obtained and reviewed including: ***  Labs:  I ordered, and personally interpreted labs.  The pertinent results include:   ***  Imaging: I ordered imaging studies including *** I independently visualized and interpreted imaging which showed: *** I agree with the radiologist interpretation  Medications:  I ordered medication including *** for *** Reevaluation of the patient after these medicines and interventions, I reevaluated the patient and found that they have {resolved/improved/worsened:23923::"improved"} I have reviewed the patients home medicines and have made adjustments as needed   {Cardiac Monitoring: The patient was maintained on a cardiac monitor.  I personally viewed and interpreted the cardiac monitored which showed an underlying rhythm of: ***.   Test Considered: ***   Critical Interventions ***}   {Consultations: I requested consultation with the  {sabspecialists:27145}, and discussed lab and imaging findings as well as pertinent plan - they recommend: ***}  Social Determinants of Health: ***   Patient case discussed with Ysidro Evert, PA-C at sign-out. Plan at sign-out is pending labs. Disposition as per oncoming team. However, plans may change as per oncoming team. Patient care transferred at sign out.   Disposition: {End of MDM here with the likely diagnosis}. After consideration of the diagnostic results and the patients response to treatment, I feel that the patient would benefit from {sabdispo:27146}. Supportive care measures and strict return precautions discussed with patient at bedside. Pt acknowledges and verbalizes understanding. Pt appears safe for discharge. Follow up as indicated in discharge paperwork.    This chart was dictated using voice recognition software, Dragon. Despite the best efforts of this provider to proofread and correct errors, errors may still occur which can change documentation meaning.   Final Clinical Impression(s) / ED Diagnoses Final diagnoses:  None    Rx / DC Orders ED Discharge Orders     None

## 2023-01-17 ENCOUNTER — Inpatient Hospital Stay (HOSPITAL_BASED_OUTPATIENT_CLINIC_OR_DEPARTMENT_OTHER): Payer: Medicare Other | Admitting: Medical Oncology

## 2023-01-17 ENCOUNTER — Other Ambulatory Visit: Payer: Self-pay

## 2023-01-17 ENCOUNTER — Encounter: Payer: Self-pay | Admitting: Medical Oncology

## 2023-01-17 ENCOUNTER — Inpatient Hospital Stay: Payer: Medicare Other

## 2023-01-17 ENCOUNTER — Inpatient Hospital Stay: Payer: Medicare Other | Attending: Hematology & Oncology

## 2023-01-17 VITALS — BP 124/43 | HR 78 | Temp 97.9°F | Resp 19 | Ht 66.0 in | Wt 213.1 lb

## 2023-01-17 DIAGNOSIS — C50911 Malignant neoplasm of unspecified site of right female breast: Secondary | ICD-10-CM

## 2023-01-17 DIAGNOSIS — Z853 Personal history of malignant neoplasm of breast: Secondary | ICD-10-CM | POA: Diagnosis not present

## 2023-01-17 DIAGNOSIS — M818 Other osteoporosis without current pathological fracture: Secondary | ICD-10-CM

## 2023-01-17 DIAGNOSIS — M810 Age-related osteoporosis without current pathological fracture: Secondary | ICD-10-CM | POA: Insufficient documentation

## 2023-01-17 DIAGNOSIS — C186 Malignant neoplasm of descending colon: Secondary | ICD-10-CM

## 2023-01-17 DIAGNOSIS — D696 Thrombocytopenia, unspecified: Secondary | ICD-10-CM | POA: Diagnosis not present

## 2023-01-17 DIAGNOSIS — T386X5A Adverse effect of antigonadotrophins, antiestrogens, antiandrogens, not elsewhere classified, initial encounter: Secondary | ICD-10-CM

## 2023-01-17 DIAGNOSIS — Z85038 Personal history of other malignant neoplasm of large intestine: Secondary | ICD-10-CM | POA: Diagnosis not present

## 2023-01-17 DIAGNOSIS — M8589 Other specified disorders of bone density and structure, multiple sites: Secondary | ICD-10-CM

## 2023-01-17 DIAGNOSIS — Z170 Estrogen receptor positive status [ER+]: Secondary | ICD-10-CM | POA: Diagnosis not present

## 2023-01-17 LAB — CBC WITH DIFFERENTIAL (CANCER CENTER ONLY)
Abs Immature Granulocytes: 0.02 10*3/uL (ref 0.00–0.07)
Basophils Absolute: 0 10*3/uL (ref 0.0–0.1)
Basophils Relative: 0 %
Eosinophils Absolute: 0.1 10*3/uL (ref 0.0–0.5)
Eosinophils Relative: 2 %
HCT: 39.1 % (ref 36.0–46.0)
Hemoglobin: 12.3 g/dL (ref 12.0–15.0)
Immature Granulocytes: 0 %
Lymphocytes Relative: 55 %
Lymphs Abs: 4.4 10*3/uL — ABNORMAL HIGH (ref 0.7–4.0)
MCH: 29.9 pg (ref 26.0–34.0)
MCHC: 31.5 g/dL (ref 30.0–36.0)
MCV: 94.9 fL (ref 80.0–100.0)
Monocytes Absolute: 0.5 10*3/uL (ref 0.1–1.0)
Monocytes Relative: 7 %
Neutro Abs: 3 10*3/uL (ref 1.7–7.7)
Neutrophils Relative %: 36 %
Platelet Count: 132 10*3/uL — ABNORMAL LOW (ref 150–400)
RBC: 4.12 MIL/uL (ref 3.87–5.11)
RDW: 14.2 % (ref 11.5–15.5)
WBC Count: 8.1 10*3/uL (ref 4.0–10.5)
nRBC: 0 % (ref 0.0–0.2)

## 2023-01-17 MED ORDER — SODIUM CHLORIDE 0.9 % IV SOLN: Freq: Once | INTRAVENOUS | Status: AC

## 2023-01-17 MED ORDER — ZOLEDRONIC ACID 4 MG/100ML IV SOLN
4.0000 mg | Freq: Once | INTRAVENOUS | Status: AC
  Administered 2023-01-17: 4 mg via INTRAVENOUS
  Filled 2023-01-17: qty 100

## 2023-01-17 NOTE — Patient Instructions (Signed)

## 2023-01-17 NOTE — Progress Notes (Addendum)
Hematology and Oncology Follow Up Visit  Crystal Lee [id number] 1934-05-17 89 y.o. 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 Yearly -- next dose on 11/2022    Interim History:  Crystal Lee is here today for follow-up.   At her last visit on 12/21/2022 she had new thrombocytopenia. Since this was a new change we elected to have her return in a shorter interval to have repeat labs. She also had her Zometa held due to a dental concern.   Today she states that she was treated for a UTI and bronchitis. Feeling much better.   She reports that she has no intentions of getting her broken teeth fixed within the next 6 months.   No fever, chills, n/v, cough, rash, dizziness, SOB, chest pain, palpitations, abdominal pain or changes in bowel or bladder habits.  No blood loss, bruising or petechiae.  No skin changes or changes to chest wall.  No adenopathy or lymphedema noted.  No swelling in her extremities.  She has chronic back pain.  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  No falls or syncope reported.  Appetite and hydration are good.  Wt Readings from Last 3 Encounters:  01/17/23 213 lb 1.6 oz (96.7 kg)  01/14/23 213 lb (96.6 kg)  01/14/23 213 lb (96.6 kg)     ECOG Performance Status: 1 - Symptomatic but completely ambulatory  Medications:  Allergies as of 01/17/2023       Reactions   Morphine Nausea And Vomiting   Propoxyphene Nausea And Vomiting, Palpitations, Other (See Comments)   Darvocet Causes Sweats   Donepezil Diarrhea, Other (See Comments)   Morphine And Codeine  Nausea And Vomiting   Tape Rash, Other (See Comments)        Medication List        Accurate as of January 17, 2023  2:04 PM. If you have any questions, ask your nurse or doctor.          albuterol 108 (90 Base) MCG/ACT inhaler Commonly known as: VENTOLIN HFA Inhale 1-2 puffs into the lungs every 6 (six) hours as needed for up to 5 days for wheezing or shortness of breath.   aspirin EC 81 MG tablet Take 81 mg by mouth daily. Swallow whole.   atorvastatin 20 MG tablet Commonly known as: LIPITOR Take 1 tablet (20 mg total) by mouth daily.   bacitracin ophthalmic ointment Apply to eyelids at bedtime as needed.   ciprofloxacin 500 MG tablet Commonly known as: CIPRO Take 1 tablet (500 mg total) by mouth 2 (two) times daily for 10 days.   conjugated estrogens vaginal cream Commonly known as: PREMARIN Place 1 Applicatorful vaginally 2 (two) times a week. Place 0.5g nightly twice a week   cyanocobalamin 1000 MCG tablet Commonly known as: VITAMIN B12 Take 1,000 mcg by mouth daily.   cyclobenzaprine 5 MG tablet Commonly known as: FLEXERIL Take 1 tablet (5 mg total) by mouth at bedtime as needed for muscle spasms.   estradiol 0.1 MG/GM vaginal cream Commonly known as: ESTRACE Place 0.5 g vaginally 2 (two) times a week as directed  HYDROcodone-acetaminophen 10-325 MG tablet Commonly known as: NORCO Take 1 tablet by mouth every 6 (six) hours as needed.   ipratropium 0.06 % nasal spray Commonly known as: ATROVENT Place 2 sprays into both nostrils 3 (three) times daily.   lidocaine 4 % Place 1-3 patches onto the skin daily. Use for up to 12 hours in a 24 hour period   losartan 25 MG tablet Commonly known as: COZAAR Take 1 tablet (25 mg total) by mouth daily.   memantine 5 MG tablet Commonly known as: NAMENDA Take 1 tablet (5 mg total) by mouth at bedtime.   methenamine 1 g tablet Commonly known as: HIPREX Take 1 tablet (1 g total) by mouth 2 (two) times daily  with a meal.   mirabegron ER 50 MG Tb24 tablet Commonly known as: MYRBETRIQ Take 1 tablet (50 mg total) by mouth daily.   Multivitamin Adults Tabs Take 1 tablet by mouth daily.   neomycin-polymyxin b-dexamethasone 3.5-10000-0.1 Susp Commonly known as: MAXITROL   predniSONE 10 MG tablet Commonly known as: DELTASONE Take 2 tablets (20 mg total) by mouth daily for 4 days.   promethazine-dextromethorphan 6.25-15 MG/5ML syrup Commonly known as: PROMETHAZINE-DM Take 5 mLs by mouth 4 (four) times daily as needed.   valACYclovir 1000 MG tablet Commonly known as: Valtrex Take 1 tablet (1,000 mg total) by mouth 2 (two) times daily.   VITAMIN D2 PO   Vitamin D (Ergocalciferol) 1.25 MG (50000 UNIT) Caps capsule Commonly known as: DRISDOL Take 50,000 Units by mouth once a week.   Vitamin D3 1.25 MG (50000 UT) Caps Take 1 weekly        Allergies:  Allergies  Allergen Reactions   Morphine Nausea And Vomiting   Propoxyphene Nausea And Vomiting, Palpitations and Other (See Comments)    Darvocet Causes Sweats   Donepezil Diarrhea and Other (See Comments)   Morphine And Codeine Nausea And Vomiting   Tape Rash and Other (See Comments)    Past Medical History, Surgical history, Social history, and Family History were reviewed and updated.  Review of Systems: All other 10 point review of systems is negative.   Physical Exam:  height is 5\' 6"  (1.676 m) and weight is 213 lb 1.6 oz (96.7 kg). Her oral temperature is 97.9 F (36.6 C). Her blood pressure is 124/43 (abnormal) and her pulse is 78. Her respiration is 19 and oxygen saturation is 96%.   Wt Readings from Last 3 Encounters:  01/17/23 213 lb 1.6 oz (96.7 kg)  01/14/23 213 lb (96.6 kg)  01/14/23 213 lb (96.6 kg)   Constitutional: Using a rolling walker  Ocular: Sclerae unicteric, pupils equal, round and reactive to light Ear-nose-throat: Oropharynx clear, dentition fair Lymphatic: No cervical or supraclavicular  adenopathy Lungs no rales or rhonchi, good excursion bilaterally Heart regular rate and rhythm, no murmur appreciated Abd soft, nontender, positive bowel sounds MSK no focal spinal tenderness, no joint edema Neuro: non-focal, well-oriented, appropriate affect Breasts: Deferred   Lab Results  Component Value Date   WBC 8.1 01/17/2023   HGB 12.3 01/17/2023   HCT 39.1 01/17/2023   MCV 94.9 01/17/2023   PLT 132 (L) 01/17/2023   Lab Results  Component Value Date   FERRITIN 63 12/21/2022   IRON 75 12/21/2022   TIBC 344 12/21/2022   UIBC 269 12/21/2022   IRONPCTSAT 22 12/21/2022   Lab Results  Component Value Date   RETICCTPCT 1.0 12/21/2022   RBC 4.12 01/17/2023   No results found for: "KPAFRELGTCHN", "  LAMBDASER", "[facility]" Lab Results  Component Value Date   IGGSERUM 1,085 02/09/2017   IGMSERUM 336 (H) 02/09/2017   No results found for: "TOTALPROTELP", "ALBUMINELP", "A1GS", "A2GS", "BETS", "BETA2SER", "GAMS", "MSPIKE", "SPEI"   Chemistry      Component Value Date/Time   NA 136 01/14/2023 1309   NA 139 02/25/2020 1627   K 4.0 01/14/2023 1309   CL 103 01/14/2023 1309   CO2 24 01/14/2023 1309   BUN 14 01/14/2023 1309   BUN 11 02/25/2020 1627   CREATININE 0.59 01/14/2023 1309   CREATININE 0.58 12/21/2022 1049      Component Value Date/Time   CALCIUM 9.5 01/14/2023 1309   ALKPHOS 103 01/14/2023 1309   AST 45 (H) 01/14/2023 1309   AST 29 12/21/2022 1049   ALT 34 01/14/2023 1309   ALT 20 12/21/2022 1049   BILITOT 0.6 01/14/2023 1309   BILITOT 0.5 12/21/2022 1049      Impression and Plan: Ms. Mott is a very pleasant 89 yo caucasian female with history of bilateral stage Ia breast cancer diagnosed back in 1998. She has locally advanced colon cancer stage IIIb with 3+ lymph nodes. She completed all of her adjuvant therapy, with Xeloda, in July 2022.  Happy to see that her platelets have responded well. Likely reactive to her recent illness. I expect them to  fully resolve over the next 1-2 months.   In terms of her Zometa she strongly desires this today and not to hold until her dental work is completed. She does report that the teeth involved are not causing her trouble and have had a root canal which lowers her risks. CMP from 12/14/2022 shows normal calcium levels.   Disposition Zometa today Keep 5 month MD appointment as scheduled   Rushie Chestnut, PA-C 8/19/20242:04 PM

## 2023-01-18 NOTE — Patient Instructions (Incomplete)
It was great to see you again today  Recommend flu shot, COVID booster this fall.  Also consider getting the shingles vaccine series at your pharmacy  I will be in touch with your urine culture from today Lidocaine patches sent to [facility]- let me know if you have trouble getting this rx filled again!  Estrogen cream- would suggest using 2-3x a week for about a month, let's see if this helps you with your bladder discomfort

## 2023-01-18 NOTE — Progress Notes (Addendum)
[facility] [address], [address] [place], [place] [address] [phone]  Date:  01/19/2023   Name:  Crystal Lee   DOB:  03-25-1934   MRN:  [id number]  PCP:  Pearline Cables, MD    Chief Complaint: 6 month follow up (Concerns/ questions: pt would like to make sure the UTI has cleared. She also says she doesn't feel like the bronchitis has cleared. )   History of Present Illness:  Crystal Lee is a 89 y.o. very pleasant female patient who presents with the following:  Patient seen today for periodic follow-up- history of hypertension, hyperlipidemia, sleep apnea, breast cancer and colon cancer presently in remission, mild dementia and likely due to Alzheimer's disease per neurology  Most recent visit with myself was in April She was seen by general surgery at [facility] on March 26 to follow-up from her history of colon cancer and ventral incisional hernia She had a lap assisted partial colectomy January 2022 as well as chemotherapy.  She had follow-up repair of incisional hernia in April 2023  At her last visit she had concern of vaginal discharge and itching, I treated her with fluconazole  She was seen in the ER on Friday, 8/16-she had concern of shortness of breath, the ER noted her sats were normal and they did a CT angiogram which was negative.  She was given prednisone for possible bronchitis They also treated her with cipro for UTI but I don't see a culture- we can get a culture today  She notes she tends to have lower abdominal pain - suprapubic- for 2-3 months She notes she still tends to feel out of breath since she had covid in July-this is not getting worse, but is just all present CT angiogram 01/14/23: IMPRESSION: 1. No evidence for pulmonary embolism. 2. No acute cardiopulmonary process. 3. Mild cardiomegaly. 4. Aortic atherosclerosis.  At this time she notes a chronic, nonspecific discomfort in her suprapubic  region and a feeling of urinary irritation.  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 Yearly -- next dose on 11/2022 Impression and Plan: [name] is a very pleasant 89 yo caucasian female with history of bilateral stage Ia breast cancer diagnosed back in 1998. She has locally advanced colon cancer stage IIIb with 3+ lymph nodes. She completed all of her adjuvant therapy, with Xeloda, in July 2022. Happy to see that her platelets have responded well. Likely reactive to her recent illness. I expect them to fully resolve over the next 1-2 months.  In terms of her Zometa she strongly desires this today and not to hold until her dental work is completed. She does report that the teeth involved are not causing her trouble and have had a root canal which lowers her risks. CMP from 12/14/2022 shows normal calcium levels.   Patient Active Problem List  Diagnosis Date Noted   Crystal Lee impairment 07/12/2022   Incisional hernia 09/16/2021   Pain 05/05/2021   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 behavioral disturbance, psychotic disturbance, mood disturbance, or anxiety (HCC) 05/05/2021   Stroke-like symptoms 03/25/2021   Sleep apnea    Rosacea    PONV (postoperative nausea and vomiting)    Nocturia    Neuropathy    Melanoma (HCC)    Hypertension    Hyperlipidemia    Fractured hip (HCC)    Difficulty sleeping    Complication of  anesthesia    Cancer (HCC)    Arthritis    Malignant neoplasm of descending colon (HCC)    Gastric polyps    Colonic mass    Diverticulosis of colon without hemorrhage    Adenomatous polyp of sigmoid colon    Acute lower GI bleeding 06/09/2020   Lower GI bleed 06/08/2020   Acute blood loss anemia 06/08/2020   HTN (hypertension) 06/08/2020   Constipation    Encounter for orthopedic follow-up care 06/18/2019   Urinary tract infection 04/20/2019   Acute pain of right wrist 03/28/2019   Tenosynovitis, wrist 03/28/2019   Acquired trigger finger of right little finger 01/15/2019   Carpal tunnel syndrome of right wrist 01/15/2019   Aftercare 08/17/2018   Lumbar post-laminectomy syndrome 07/31/2018   Heartburn    Fracture of superior pubic ramus (HCC) 06/16/2018   Radial styloid tenosynovitis of left hand 05/16/2018   Pain of left hand 04/20/2018   Osteopenia 04/15/2018   Dyslipidemia 02/02/2018   GERD (gastroesophageal reflux disease) 02/02/2018   Trochanteric bursitis of left hip 12/23/2017   Primary osteoarthritis of left knee 10/17/2017   History of right knee joint replacement 10/17/2017   Pain in both lower extremities 08/02/2017   Stage 1 breast cancer, ER+, right (HCC) 07/04/2017   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   Pain in right hand 06/10/2017   Trigger finger of right hand 06/10/2017   Paresthesia 02/09/2017   Low back pain 02/09/2017   Gait abnormality 02/09/2017   OA (osteoarthritis) of hip 05/09/2015    Past Medical History:  Diagnosis Date   Arthritis    Cancer (HCC)    HX BREAST CANCER/ SKIN CANCER   Colon cancer (HCC)    Complication of anesthesia    N/V WITH MORPHINE   Difficulty sleeping    Fractured hip (HCC)    LEFT - AUG 2016   GERD (gastroesophageal reflux disease)    Hyperlipidemia    Hypertension    Melanoma (HCC)    Neuropathy    Nocturia    Osteopenia    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   PONV  (postoperative nausea and vomiting)    PT STATES MORPHINE CAUSED N/V   Rosacea    Sleep apnea    Stage 1 breast cancer, ER+, right (HCC) 07/04/2017    Past Surgical History:  Procedure Laterality Date   48 HOUR PH STUDY N/A 06/21/2018   Procedure: 24 HOUR PH STUDY;  Surgeon: Shellia Cleverly, DO;  Location: [facility];  Service: Gastroenterology;  Laterality: N/A;  with impedance   ABDOMINAL HYSTERECTOMY  2010   ANAL RECTAL MANOMETRY N/A 09/19/2019   Procedure: ANO RECTAL MANOMETRY;  Surgeon: Napoleon Form, MD;  Location: [facility];  Service: Endoscopy;  Laterality: N/A;   APPENDECTOMY  1949   BACK SURGERY  1973   BIOPSY  06/10/2020   Procedure: BIOPSY;  Surgeon: Shellia Cleverly, DO;  Location: [facility];  Service: Gastroenterology;;  EGD and COLON   BREAST SURGERY     CATARACT EXTRACTION Bilateral 2011   CHOLECYSTECTOMY     COLON RESECTION N/A 06/12/2020   Procedure: LAPAROSCOPIC COLON RESECTION;  Surgeon: Abigail Miyamoto, MD;  Location: [facility];  Service: General;  Laterality: N/A;   COLONOSCOPY WITH PROPOFOL N/A 06/10/2020   Procedure: COLONOSCOPY WITH PROPOFOL;  Surgeon: Shellia Cleverly, DO;  Location: [facility];  Service: Gastroenterology;  Laterality: N/A;   ESOPHAGEAL MANOMETRY N/A 06/21/2018   Procedure: ESOPHAGEAL MANOMETRY (EM);  Surgeon: Shellia Cleverly, DO;  Location: [facility];  Service: Gastroenterology;  Laterality: N/A;   ESOPHAGOGASTRODUODENOSCOPY (EGD) WITH PROPOFOL N/A 06/10/2020   Procedure: ESOPHAGOGASTRODUODENOSCOPY (EGD) WITH PROPOFOL;  Surgeon: Shellia Cleverly, DO;  Location: [facility];  Service: Gastroenterology;  Laterality: N/A;   GALLBLADDER SURGERY  2015   INCISIONAL HERNIA REPAIR N/A 09/16/2021   Procedure: OPEN INCISIONAL HERNIA REPAIR WITH MESH;  Surgeon: Abigail Miyamoto, MD;  Location: [facility];  Service: General;  Laterality: N/A;   JOINT REPLACEMENT     RT TOTAL HIP / RT TOTAL KNEE   MASTECTOMY  1998   BILATERAL    PH  IMPEDANCE STUDY  06/21/2018   Procedure: PH IMPEDANCE STUDY;  Surgeon: Shellia Cleverly, DO;  Location: [facility];  Service: Gastroenterology;;   POLYPECTOMY  06/10/2020   Procedure: POLYPECTOMY;  Surgeon: Shellia Cleverly, DO;  Location: [facility];  Service: Gastroenterology;;   SKIN CANCER EXCISION  2016   RT SIDE OF NOSE   SUBMUCOSAL TATTOO INJECTION  06/10/2020   Procedure: SUBMUCOSAL TATTOO INJECTION;  Surgeon: Shellia Cleverly, DO;  Location: [facility];  Service: Gastroenterology;;   TONSILLECTOMY  1953   TOTAL HIP ARTHROPLASTY  2010   RIGHT   TOTAL HIP ARTHROPLASTY Left 05/09/2015   Procedure: LEFT TOTAL HIP ARTHROPLASTY ANTERIOR APPROACH;  Surgeon: Ollen Gross, MD;  Location: [facility];  Service: Orthopedics;  Laterality: Left;   TOTAL KNEE ARTHROPLASTY  2001   TUMOR REMOVAL  2012   ABDOMINAL - NON CANCEROUS    Social History   Tobacco Use   Smoking status: Former    Current packs/day: 0.00    Average packs/day: 0.5 packs/day for 20.0 years (10.0 ttl pk-yrs)    Types: Cigarettes    Start date: 02/03/1956    Quit date: 02/03/1976    Years since quitting: 46.9   Smokeless tobacco: Never  Vaping Use   Vaping status: Never Used  Substance Use Topics   Alcohol use: No   Drug use: No    Family History  Problem Relation Age of Onset   Other Mother        complications from flu   Other Father        unsure of cause   Colon cancer Neg Hx     Allergies  Allergen Reactions   Morphine Nausea And Vomiting   Propoxyphene Nausea And Vomiting, Palpitations and Other (See Comments)    Darvocet Causes Sweats   Donepezil Diarrhea and Other (See Comments)   Morphine And Codeine Nausea And Vomiting   Tape Rash and Other (See Comments)    Medication list has been reviewed and updated.  Current Outpatient Medications on File Prior to Visit  Medication Sig Dispense Refill   albuterol (VENTOLIN HFA) 108 (90 Base) MCG/ACT inhaler Inhale 1-2 puffs into the lungs every 6  (six) hours as needed for up to 5 days for wheezing or shortness of breath. 8 g 0   aspirin EC 81  MG tablet Take 81 mg by mouth daily. Swallow whole.     atorvastatin (LIPITOR) 20 MG tablet Take 1 tablet (20 mg total) by mouth daily. 90 tablet 3   bacitracin ophthalmic ointment Apply to eyelids at bedtime as needed. 3.5 g 6   Cholecalciferol (VITAMIN D3) 1.25 MG (50000 UT) CAPS Take 1 weekly 12 capsule 3   ciprofloxacin (CIPRO) 500 MG tablet Take 1 tablet (500 mg total) by mouth 2 (two) times daily for 10 days. 20 tablet 0   conjugated estrogens (PREMARIN) vaginal cream Place 1 Applicatorful vaginally 2 (two) times a week. Place 0.5g nightly twice a week 90 g 3   cyclobenzaprine (FLEXERIL) 5 MG tablet Take 1 tablet (5 mg total) by mouth at bedtime as needed for muscle spasms. 30 tablet 1   Ergocalciferol (VITAMIN D2 PO)      estradiol (ESTRACE) 0.1 MG/GM vaginal cream Place 0.5 g vaginally 2 (two) times a week as directed 30 g 11   HYDROcodone-acetaminophen (NORCO) 10-325 MG tablet Take 1 tablet by mouth every 6 (six) hours as needed. 120 tablet 0   ipratropium (ATROVENT) 0.06 % nasal spray Place 2 sprays into both nostrils 3 (three) times daily. 15 mL 5   losartan (COZAAR) 25 MG tablet Take 1 tablet (25 mg total) by mouth daily. 90 tablet 3   memantine (NAMENDA) 5 MG tablet Take 1 tablet (5 mg total) by mouth at bedtime. 30 tablet 11   methenamine (HIPREX) 1 g tablet Take 1 tablet (1 g total) by mouth 2 (two) times daily with a meal. 60 tablet 11   mirabegron ER (MYRBETRIQ) 50 MG TB24 tablet Take 1 tablet (50 mg total) by mouth daily. 90 tablet 3   Multiple Vitamins-Minerals (MULTIVITAMIN ADULTS) TABS Take 1 tablet by mouth daily.     neomycin-polymyxin b-dexamethasone (MAXITROL) 3.5-10000-0.1 SUSP      promethazine-dextromethorphan (PROMETHAZINE-DM) 6.25-15 MG/5ML syrup Take 5 mLs by mouth 4 (four) times daily as needed. 118 mL 0   valACYclovir (VALTREX) 1000 MG tablet Take 1 tablet (1,000 mg  total) by mouth 2 (two) times daily. 60 tablet 2   vitamin B-12 (CYANOCOBALAMIN) 1000 MCG tablet Take 1,000 mcg by mouth daily.     Vitamin D, Ergocalciferol, (DRISDOL) 1.25 MG (50000 UNIT) CAPS capsule Take 50,000 Units by mouth once a week.     No current facility-administered medications on file prior to visit.    Review of Systems:  As per HPI- otherwise negative.   Physical Examination: Vitals:   01/19/23 1104  BP: 120/72  Pulse: 75  Resp: 18  Temp: (!) 97.5 F (36.4 C)  SpO2: 97%   Vitals:   01/19/23 1104  Weight: 213 lb 6.4 oz (96.8 kg)  Height: 5\' 6"  (1.676 m)   Body mass index is 34.44 kg/m. Ideal Body Weight: Weight in (lb) to have BMI = 25: 154.6  GEN: no acute distress.  Obese, looks well and her normal self HEENT: Atraumatic, Normocephalic.  Ears and Nose: No external deformity. CV: RRR, No M/G/R. No JVD. No thrill. No extra heart sounds. PULM: CTA B, no wheezes, crackles, rhonchi. No retractions. No resp. distress. No accessory muscle use. ABD: S, NT, ND, +BS. No rebound. No HSM.  Abdominal exam today is benign EXTR: No c/c/e PSYCH: Normally interactive. Conversant.    Assessment and Plan: Urinary frequency - Plan: Urine Culture  Chronic bilateral low back pain without sciatica - Plan: lidocaine 4 %  COVID-19 long hauler manifesting chronic dyspnea  Patient seen today with a couple of concerns.  She notes urinary frequency, she was recently treated in the emergency department with Cipro but a urine culture was not performed.  Will check a urine culture today Vaginal atrophy may be contributing to her symptoms, suggested she try using her estrogen cream 2 or 3 times a week for perhaps a month, then taper.  We discussed risks and benefits of using vaginal estrogen as she does have history of breast cancer.  At age 70 most likely symptom relief benefit outweighs cancer recurrence risk Raea notes I prescribed lidocaine patches earlier this year for her  chronic back pain, but she never received them.  Will send prescription again She notes chronic shortness of breath since she had COVID last month.  I offered to have her see pulmonology, she declines for the time being.  She will continue monitoring her symptoms   Signed Abbe Amsterdam, MD Received urine culture 8/23-   Results for orders placed or performed in visit on 01/19/23  Urine Culture   Specimen: Urine  Result Value Ref Range   MICRO NUMBER: [id number]    SPECIMEN QUALITY: Adequate    Sample Source NOT GIVEN    STATUS: FINAL    Result: No Growth    Gave her a call and let her know

## 2023-01-19 ENCOUNTER — Ambulatory Visit (INDEPENDENT_AMBULATORY_CARE_PROVIDER_SITE_OTHER): Payer: Medicare Other | Admitting: Family Medicine

## 2023-01-19 ENCOUNTER — Encounter: Payer: Self-pay | Admitting: Hematology & Oncology

## 2023-01-19 VITALS — BP 120/72 | HR 75 | Temp 97.5°F | Resp 18 | Ht 66.0 in | Wt 213.4 lb

## 2023-01-19 DIAGNOSIS — U099 Post covid-19 condition, unspecified: Secondary | ICD-10-CM | POA: Diagnosis not present

## 2023-01-19 DIAGNOSIS — R0609 Other forms of dyspnea: Secondary | ICD-10-CM

## 2023-01-19 DIAGNOSIS — R350 Frequency of micturition: Secondary | ICD-10-CM

## 2023-01-19 DIAGNOSIS — G8929 Other chronic pain: Secondary | ICD-10-CM

## 2023-01-19 DIAGNOSIS — M545 Low back pain, unspecified: Secondary | ICD-10-CM

## 2023-01-19 MED ORDER — LIDOCAINE 4 % EX PTCH: 1.0000 | MEDICATED_PATCH | CUTANEOUS | 3 refills | Status: DC

## 2023-01-20 LAB — URINE CULTURE
MICRO NUMBER:: 15361897
Result:: NO GROWTH
SPECIMEN QUALITY:: ADEQUATE

## 2023-01-24 ENCOUNTER — Other Ambulatory Visit (HOSPITAL_COMMUNITY): Payer: Self-pay

## 2023-01-24 ENCOUNTER — Encounter: Payer: Self-pay | Admitting: Physician Assistant

## 2023-01-24 ENCOUNTER — Ambulatory Visit (INDEPENDENT_AMBULATORY_CARE_PROVIDER_SITE_OTHER): Payer: Medicare Other | Admitting: Physician Assistant

## 2023-01-24 VITALS — BP 146/76 | HR 80 | Resp 18 | Ht 66.0 in | Wt 213.0 lb

## 2023-01-24 DIAGNOSIS — F03A0 Unspecified dementia, mild, without behavioral disturbance, psychotic disturbance, mood disturbance, and anxiety: Secondary | ICD-10-CM

## 2023-01-24 MED ORDER — MEMANTINE HCL 5 MG PO TABS
5.0000 mg | ORAL_TABLET | Freq: Two times a day (BID) | ORAL | 3 refills | Status: DC
  Filled 2023-01-24: qty 180, 90d supply, fill #0

## 2023-01-24 NOTE — Progress Notes (Signed)
Assessment/Plan:   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 behavioral disturbance, late onset  Crystal Lee is a very pleasant 89 y.o. RH female with a history of hypertension, hyperlipidemia, sleep apnea, iron deficiency anemia, stage IIIb colon cancer recently off Xeloda, on Zometa yearly, history of stage Ia breast cancer, anxiety, depression, and mild dementia likely due to  Alzheimer's disease seen today in follow up for memory loss. Patient is currently on memantine 5 mg nightly, tolerating well, so she has agreed to increase memantine to its therapeutic dose of 5 mg bid.  She continues to be able to perform her ADLs, and drive without any significant issues. Memory is stable. She declines MMSE unless noticing any memory changes.     Follow up in 6 months. Continue memantine increase to  5 mg at bid , side effects discussed (donepezil, GI side effects) Recommend good control of her cardiovascular risk factors Continue to control mood as per PCP     Subjective:    This patient is here alone.  Previous records as well as any outside records available were reviewed prior to todays visit. Patient was last seen on 07/12/2022.    Any changes in memory since last visit? "  About the same ". She enjoys doing crossword puzzles, memorizing capitals of the world, reading, watching documentaries and marine information.  Long-term memory is good.  Repeats oneself?  Endorsed Disoriented when walking into a room?  Patient denies   Leaving objects in unusual places?  May misplace things but not in unusual places "I did that all my life though".  Wandering behavior?  denies   Any personality changes since last visit?  denies   Any worsening depression?:  Denies.   Hallucinations or paranoia?  Denies.   Seizures? denies    Any sleep changes?  Does not sleep very well . Denies vivid dreams, REM behavior or sleepwalking.  She takes melatonin as needed. Sleep apnea?   Denies.   Any hygiene concerns? Denies.   Independent of bathing and dressing?  Endorsed  Does the patient needs help with medications?  Patient is in charge, denies amy issues Who is in charge of the finances?  Patient is in charge  Any changes in appetite? "I have been eating a lot of sweets.   Patient have trouble swallowing? Denies.   Does the patient cook? No Any headaches?   denies   Chronic back pain  denies   Ambulates with difficulty?  She has chronic bilateral lower extremity paresthesias and left radiculitis, L2-3 compression fracture as well as multiple areas of arthritis and spasms with chronic pain syndrome on several agents, followed by PCP.  She declines PT.  She is on rest and heat therapy.  Uses a walker for long distance otherwise a cane  Recent falls or head injuries? denies     Unilateral weakness, numbness or tingling? denies   Any tremors?  She has chronic resting tremor since living in [place]. Any anosmia?  Denies   Any incontinence of urine?  Endorsed, wears diapers Any bowel dysfunction  Denies      Patient lives alone Does the patient drive?  Endorsed, denies getting lost  MRI of the brain, personally reviewed was remarkable for small microhemorrhages from amyloid angiopathy.  Initial visit 05/05/2021 the patient is seen in neurologic consultation at the request of Copland, Gwenlyn Found, MD for the evaluation of memory.  The patient is here alone.  This is a 89 y.o. year old RH  female who  presents today after hospitalization for strokelike symptoms.  It was also noted that she may have had cognitive issues during the presentation, at which time a MoCA was performed, yielding a result of 18/30.  Of note, the patient denies having any cognitive issues, treated in some of short-term memory difficulties to "old age ".  During the hospitalization, she was diagnosed with small vessel disease, evidence of amyloid angiopathy per MRI, central nervous system degenerative disorder, and mild to moderate Alzheimer's disease  per review of the notes.  She was to follow-up at [facility], but she wanted to seek additional neurologic opinion with [facility] neurology instead. For her memory, she denies repeating herself, or being disoriented when walking into the room.  She denies leaving objects in unusual places.  She ambulates with her walker for safety.  She had prior falls, fracturing her pelvis, and "living with pain over the last 89 years old older ".  She only had 1 minor head injury about 17 years ago while cleaning a window, and hitting her head while she was moving forward.  She did not lose any consciousness at that time.  She denies being disoriented when walking, or wandering behavior.  She drives any distances, "without GPS I do not need it ".  She stopped driving long distances however when she came from [place] in 2018 to retire, and her vision was not as good.  Since her husband's death, she lives alone, and has home health visiting her.  Her mood is good, she denies depression or irritability.  She enjoys painting, and coloring.  She is to be able to do crossword puzzles but she stopped due to "eye problems ".  She sleeps well, denies vivid dreams or sleepwalking.  She denies hallucinations or paranoia.  There are no hygiene concerns.  She is independent of bathing and dressing.  Her medications are in a pillbox.  Her finances are done by her, only taxes are done by one of her children.  Her appetite is good, denies any trouble swallowing.  She cooks without forgetting any common recipes, or burning the stove.     As for her strokelike symptoms, the patient reports that she had similar episodes in the year 2000, when she noticed mild facial drooping and numbness, which quickly resolved, without recurrence.  On the day of evaluation in October 2022, she was at her doctor's office, when she had 20 second  episode (she denies this was 20-minute episodes as in chart).  Her PCP told her to go "downstairs today urgent care, to check  that out ".  From the urgent care, the transported her to the ER, for further evaluation, suspecting that the patient was having a TIA.  Per chart notes, she may have had possible right facial droop and right-sided weakness, although she states that for the last 20 years, she has slightly decreased strength on the right side of her body.  The ER neurologist saw her, and work-up was initiated.  MRI of the brain did not show acute CVA, but did show amyloid angiopathy.  EEG was without seizures.  2D echo was unremarkable.  Neurology recommended aspirin 81 mg on discharge, and no DAPT was going to be given, because of concern of amyloid angiopathy.  Lipitor 20 was added on discharge.  No further episodes of strokelike symptoms since then.  Of note, during that hospitalization, she also was found to have UTI "I know I had it, I have regular UTIs".  EKG shows sinus  tachycardia.  COVID was negative.  Neurology recommended that she had a better control of her blood pressure to lays down on 140 systolic.  No stroke was diagnosed.  She denies today any headaches, double vision, dizziness, focal numbness or tingling, unilateral weakness other than the chronic mild right upper extremity as mentioned above, tremors or anosmia.  No history of seizures.  She denies urine incontinence, but occasionally wears a pad when going out.  She denies any retention.  She has a long history of constipation.  She denies any diarrhea.  She denies sleep apnea, although in chart it shows in certain areas that she may have it.  She denies any alcohol or tobacco.  Family history negative for dementia.  The patient is originally from [place].      UA and urine culture showed E. coli, resistant to bacterium, she was treated with Keflex 500 twice a day, was seen by urogynecologist Dr. Florian Buff, was put on Bactrim prophylactic treatment for 6 months, she had a history of frequent UTI  Laboratory evaluation showed LDL 91, A1c 5.0, normal CBC,  hemoglobin of 13.4, CMP, creatinine of 0.6, she was discharged with aspirin 81 mg daily, and Lipitor 20 mg daily     MRI brain  1. No evidence of acute intracranial abnormality, including infarct. 2. Numerous tiny scattered foci of susceptibility artifact throughout the cortex bilaterally, greatest in the parieto-occipital lobes. While nonspecific, findings could relate to prior microhemorrhages from amyloid angiopathy or prior microemboli. The distribution is atypical for hypertensive hemorrhages.     EMG/NCS 2019 bilateral lower extremity paresthesia, bilateral leg pain, EMG nerve conduction study then showed no large fiber peripheral neuropathy   PREVIOUS MEDICATIONS: Donepezil, GI side effects  CURRENT MEDICATIONS:  Outpatient Encounter Medications as of 01/24/2023  Medication Sig   albuterol (VENTOLIN HFA) 108 (90 Base) MCG/ACT inhaler Inhale 1-2 puffs into the lungs every 6 (six) hours as needed for up to 5 days for wheezing or shortness of breath.   aspirin EC 81 MG tablet Take 81 mg by mouth daily. Swallow whole.   atorvastatin (LIPITOR) 20 MG tablet Take 1 tablet (20 mg total) by mouth daily.   bacitracin ophthalmic ointment Apply to eyelids at bedtime as needed.   Cholecalciferol (VITAMIN D3) 1.25 MG (50000 UT) CAPS Take 1 weekly   ciprofloxacin (CIPRO) 500 MG tablet Take 1 tablet (500 mg total) by mouth 2 (two) times daily for 10 days.   conjugated estrogens (PREMARIN) vaginal cream Place 1 Applicatorful vaginally 2 (two) times a week. Place 0.5g nightly twice a week   cyclobenzaprine (FLEXERIL) 5 MG tablet Take 1 tablet (5 mg total) by mouth at bedtime as needed for muscle spasms.   Ergocalciferol (VITAMIN D2 PO)    estradiol (ESTRACE) 0.1 MG/GM vaginal cream Place 0.5 g vaginally 2 (two) times a week as directed   HYDROcodone-acetaminophen (NORCO) 10-325 MG tablet Take 1 tablet by mouth every 6 (six) hours as needed.   ipratropium (ATROVENT) 0.06 % nasal spray Place 2 sprays  into both nostrils 3 (three) times daily.   lidocaine 4 % Place 1-3 patches onto the skin daily. Use for up to 12 hours in a 24 hour period   losartan (COZAAR) 25 MG tablet Take 1 tablet (25 mg total) by mouth daily.   memantine (NAMENDA) 5 MG tablet Take 1 tablet (5 mg total) by mouth at bedtime.   methenamine (HIPREX) 1 g tablet Take 1 tablet (1 g total) by mouth 2 (two) times  daily with a meal.   mirabegron ER (MYRBETRIQ) 50 MG TB24 tablet Take 1 tablet (50 mg total) by mouth daily.   Multiple Vitamins-Minerals (MULTIVITAMIN ADULTS) TABS Take 1 tablet by mouth daily.   neomycin-polymyxin b-dexamethasone (MAXITROL) 3.5-10000-0.1 SUSP    promethazine-dextromethorphan (PROMETHAZINE-DM) 6.25-15 MG/5ML syrup Take 5 mLs by mouth 4 (four) times daily as needed.   valACYclovir (VALTREX) 1000 MG tablet Take 1 tablet (1,000 mg total) by mouth 2 (two) times daily.   vitamin B-12 (CYANOCOBALAMIN) 1000 MCG tablet Take 1,000 mcg by mouth daily.   Vitamin D, Ergocalciferol, (DRISDOL) 1.25 MG (50000 UNIT) CAPS capsule Take 50,000 Units by mouth once a week.   No facility-administered encounter medications on file as of 01/24/2023.       08/31/2022    2:13 PM  MMSE - Mini Mental State Exam  Not completed: Unable to complete      05/05/2021   12:00 PM  Montreal Cognitive Assessment   Visuospatial/ Executive (0/5) 2  Naming (0/3) 3  Attention: Read list of digits (0/2) 2  Attention: Read list of letters (0/1) 1  Attention: Serial 7 subtraction starting at 100 (0/3) 1  Language: Repeat phrase (0/2) 1  Language : Fluency (0/1) 0  Abstraction (0/2) 2  Delayed Recall (0/5) 0  Orientation (0/6) 6  Total 18    Objective:     PHYSICAL EXAMINATION:    VITALS:   Vitals:   01/24/23 1123  Height: 5\' 6"  (1.676 m)    GEN:  The patient appears stated age and is in NAD. HEENT:  Normocephalic, atraumatic.   Neurological examination:  General: NAD, well-groomed, appears stated age. Orientation:  The patient is alert. Oriented to person, place and date Cranial nerves: There is good facial symmetry.anxious appearing.  The speech is fluent and clear. No aphasia or dysarthria. Fund of knowledge is appropriate. Recent memory impaired, remote memory normal.  Attention and concentration are normal.  Able to name objects and repeat phrases.  Hearing is intact to conversational tone.  Sensation: Sensation is intact to light touch throughout Motor: Strength is at least antigravity x4. DTR's 2/4 in UE/LE     Movement examination: Tone: There is normal tone in the UE/LE Abnormal movements: She has chronic very mild resting tremors, very mild intention and postural tremor.  No myoclonus.  No asterixis.  No cogwheeling Coordination:  There is no decremation with RAM's. Normal finger to nose  Gait and Station: The patient has no difficulty arising out of a deep-seated chair without the use of the hands. The patient's stride length is good.  Gait is cautious and narrow.    Thank you for allowing [place] the opportunity to participate in the care of this nice patient. Please do not hesitate to contact us for any questions or concerns.   Total time spent on today's visit was 34 minutes dedicated to this patient today, preparing to see patient, examining the patient, ordering tests and/or medications and counseling the patient, documenting clinical information in the EHR or other health record, independently interpreting results and communicating results to the patient/family, discussing treatment and goals, answering patient's questions and coordinating care.  Cc:  Copland, Gwenlyn Found, MD  Marlowe Kays 01/24/2023 11:44 AM

## 2023-01-24 NOTE — Patient Instructions (Signed)
It was a pleasure to see you today at our office.   Recommendations:  August 26 at 11:30  Strong control of cardiovascular risk factors Continue baby aspirin and lipitor, follow up with primary doctor  Increase memantine 5 mg twice a day.    RECOMMENDATIONS FOR ALL PATIENTS WITH MEMORY PROBLEMS: 1. Continue to exercise (Recommend 30 minutes of walking everyday, or 3 hours every week) 2. Increase social interactions - continue going to [place] and enjoy social gatherings with friends and family 3. Eat healthy, avoid fried foods and eat more fruits and vegetables 4. Maintain adequate blood pressure, blood sugar, and blood cholesterol level. Reducing the risk of stroke and cardiovascular disease also helps promoting better memory. 5. Avoid stressful situations. Live a simple life and avoid aggravations. Organize your time and prepare for the next day in anticipation. 6. Sleep well, avoid any interruptions of sleep and avoid any distractions in the bedroom that may interfere with adequate sleep quality 7. Avoid sugar, avoid sweets as there is a strong link between excessive sugar intake, diabetes, and cognitive impairment We discussed the Mediterranean diet, which has been shown to help patients reduce the risk of progressive memory disorders and reduces cardiovascular risk. This includes eating fish, eat fruits and green leafy vegetables, nuts like almonds and hazelnuts, walnuts, and also use olive oil. Avoid fast foods and fried foods as much as possible. Avoid sweets and sugar as sugar use has been linked to worsening of memory function.  There is always a concern of gradual progression of memory problems. If this is the case, then we may need to adjust level of care according to patient needs. Support, both to the patient and caregiver, should then be put into place.    The [employer] is here all day, every day for people facing Alzheimer's disease through our free 24/7 Helpline:  [phone]. The Helpline provides reliable information and support to all those who need assistance, such as individuals living with memory loss, Alzheimer's or other dementia, caregivers, health care professionals and the public.  Our highly trained and knowledgeable staff can help you with: Understanding memory loss, dementia and Alzheimer's  Medications and other treatment options  General information about aging and brain health  Skills to provide quality care and to find the best care from professionals  Legal, financial and living-arrangement decisions Our Helpline also features: Confidential care consultation provided by master's level clinicians who can help with decision-making support, crisis assistance and education on issues families face every day  Help in a caller's preferred language using our translation service that features more than 200 languages and dialects  Referrals to local community programs, services and ongoing support     FALL PRECAUTIONS: Be cautious when walking. Scan the area for obstacles that may increase the risk of trips and falls. When getting up in the mornings, sit up at the edge of the bed for a few minutes before getting out of bed. Consider elevating the bed at the head end to avoid drop of blood pressure when getting up. Walk always in a well-lit room (use night lights in the walls). Avoid area rugs or power cords from appliances in the middle of the walkways. Use a walker or a cane if necessary and consider physical therapy for balance exercise. Get your eyesight checked regularly.  FINANCIAL OVERSIGHT: Supervision, especially oversight when making financial decisions or transactions is also recommended.  HOME SAFETY: Consider the safety of the kitchen when operating appliances like stoves, microwave  oven, and blender. Consider having supervision and share cooking responsibilities until no longer able to participate in those. Accidents with firearms and  other hazards in the house should be identified and addressed as well.   ABILITY TO BE LEFT ALONE: If patient is unable to contact 911 operator, consider using LifeLine, or when the need is there, arrange for someone to stay with patients. Smoking is a fire hazard, consider supervision or cessation. Risk of wandering should be assessed by caregiver and if detected at any point, supervision and safe proof recommendations should be instituted.  MEDICATION SUPERVISION: Inability to self-administer medication needs to be constantly addressed. Implement a mechanism to ensure safe administration of the medications.   DRIVING: Regarding driving, in patients with progressive memory problems, driving will be impaired. We advise to have someone else do the driving if trouble finding directions or if minor accidents are reported. Independent driving assessment is available to determine safety of driving.   If you are interested in the driving assessment, you can contact the following:  [employer] in [place] [phone]  [facility] [phone]  [facility] [phone] or [phone]      Mediterranean Diet A Mediterranean diet refers to food and lifestyle choices that are based on the traditions of countries located on the [employer]. This way of eating has been shown to help prevent certain conditions and improve outcomes for people who have chronic diseases, like kidney disease and heart disease. What are tips for following this plan? Lifestyle  Cook and eat meals together with your family, when possible. Drink enough fluid to keep your urine clear or pale yellow. Be physically active every day. This includes: Aerobic exercise like running or swimming. Leisure activities like gardening, walking, or housework. Get 7-8 hours of sleep each night. If recommended by your health care provider, drink red wine in  moderation. This means 1 glass a day for nonpregnant women and 2 glasses a day for men. A glass of wine equals 5 oz (150 mL). Reading food labels  Check the serving size of packaged foods. For foods such as rice and pasta, the serving size refers to the amount of cooked product, not dry. Check the total fat in packaged foods. Avoid foods that have saturated fat or trans fats. Check the ingredients list for added sugars, such as corn syrup. Shopping  At the grocery store, buy most of your food from the areas near the walls of the store. This includes: Fresh fruits and vegetables (produce). Grains, beans, nuts, and seeds. Some of these may be available in unpackaged forms or large amounts (in bulk). Fresh seafood. Poultry and eggs. Low-fat dairy products. Buy whole ingredients instead of prepackaged foods. Buy fresh fruits and vegetables in-season from local farmers markets. Buy frozen fruits and vegetables in resealable bags. If you do not have access to quality fresh seafood, buy precooked frozen shrimp or canned fish, such as tuna, salmon, or sardines. Buy small amounts of raw or cooked vegetables, salads, or olives from the deli or salad bar at your store. Stock your pantry so you always have certain foods on hand, such as olive oil, canned tuna, canned tomatoes, rice, pasta, and beans. Cooking  Cook foods with extra-virgin olive oil instead of using butter or other vegetable oils. Have meat as a side dish, and have vegetables or grains as your main dish. This means having meat in small portions or adding small amounts of meat to  foods like pasta or stew. Use beans or vegetables instead of meat in common dishes like chili or lasagna. Experiment with different cooking methods. Try roasting or broiling vegetables instead of steaming or sauteing them. Add frozen vegetables to soups, stews, pasta, or rice. Add nuts or seeds for added healthy fat at each meal. You can add these to yogurt,  salads, or vegetable dishes. Marinate fish or vegetables using olive oil, lemon juice, garlic, and fresh herbs. Meal planning  Plan to eat 1 vegetarian meal one day each week. Try to work up to 2 vegetarian meals, if possible. Eat seafood 2 or more times a week. Have healthy snacks readily available, such as: Vegetable sticks with hummus. Greek yogurt. Fruit and nut trail mix. Eat balanced meals throughout the week. This includes: Fruit: 2-3 servings a day Vegetables: 4-5 servings a day Low-fat dairy: 2 servings a day Fish, poultry, or lean meat: 1 serving a day Beans and legumes: 2 or more servings a week Nuts and seeds: 1-2 servings a day Whole grains: 6-8 servings a day Extra-virgin olive oil: 3-4 servings a day Limit red meat and sweets to only a few servings a month What are my food choices? Mediterranean diet Recommended Grains: Whole-grain pasta. Brown rice. Bulgar wheat. Polenta. Couscous. Whole-wheat bread. Orpah Cobb. Vegetables: Artichokes. Beets. Broccoli. Cabbage. Carrots. Eggplant. Green beans. Chard. Kale. Spinach. Onions. Leeks. Peas. Squash. Tomatoes. Peppers. Radishes. Fruits: Apples. Apricots. Avocado. Berries. Bananas. Cherries. Dates. Figs. Grapes. Lemons. Melon. Oranges. Peaches. Plums. Pomegranate. Meats and other protein foods: Beans. Almonds. Sunflower seeds. Pine nuts. Peanuts. Cod. Salmon. Scallops. Shrimp. Tuna. Tilapia. Clams. Oysters. Eggs. Dairy: Low-fat milk. Cheese. Greek yogurt. Beverages: Water. Red wine. Herbal tea. Fats and oils: Extra virgin olive oil. Avocado oil. Grape seed oil. Sweets and desserts: [place] yogurt with honey. Baked apples. Poached pears. Trail mix. Seasoning and other foods: Basil. Cilantro. Coriander. Cumin. Mint. Parsley. Sage. Rosemary. Tarragon. Garlic. Oregano. Thyme. Pepper. Balsalmic vinegar. Tahini. Hummus. Tomato sauce. Olives. Mushrooms. Limit these Grains: Prepackaged pasta or rice dishes. Prepackaged cereal with  added sugar. Vegetables: Deep fried potatoes (french fries). Fruits: Fruit canned in syrup. Meats and other protein foods: Beef. Pork. Lamb. Poultry with skin. Hot dogs. Tomasa Blase. Dairy: Ice cream. Sour cream. Whole milk. Beverages: Juice. Sugar-sweetened soft drinks. Beer. Liquor and spirits. Fats and oils: Butter. Canola oil. Vegetable oil. Beef fat (tallow). Lard. Sweets and desserts: Cookies. Cakes. Pies. Candy. Seasoning and other foods: Mayonnaise. Premade sauces and marinades. The items listed may not be a complete list. Talk with your dietitian about what dietary choices are right for you. Summary The Mediterranean diet includes both food and lifestyle choices. Eat a variety of fresh fruits and vegetables, beans, nuts, seeds, and whole grains. Limit the amount of red meat and sweets that you eat. Talk with your health care provider about whether it is safe for you to drink red wine in moderation. This means 1 glass a day for nonpregnant women and 2 glasses a day for men. A glass of wine equals 5 oz (150 mL). This information is not intended to replace advice given to you by your health care provider. Make sure you discuss any questions you have with your health care provider. Document Released: 01/08/2016 Document Revised: 02/10/2016 Document Reviewed: 01/08/2016 Elsevier Interactive Patient Education  2017 [employer].  Your provider has requested that you have labwork completed today. Please go to [facility] ([id number]) on the second floor of this building before leaving the office today.  You do not need to check in. If you are not called within 15 minutes please check with the front desk.

## 2023-02-14 ENCOUNTER — Encounter: Payer: Self-pay | Admitting: Registered Nurse

## 2023-02-14 ENCOUNTER — Encounter: Payer: Medicare Other | Attending: Physical Medicine & Rehabilitation | Admitting: Registered Nurse

## 2023-02-14 VITALS — BP 136/72 | HR 83 | Ht 66.0 in | Wt 212.0 lb

## 2023-02-14 DIAGNOSIS — Z79891 Long term (current) use of opiate analgesic: Secondary | ICD-10-CM | POA: Insufficient documentation

## 2023-02-14 DIAGNOSIS — G8929 Other chronic pain: Secondary | ICD-10-CM | POA: Insufficient documentation

## 2023-02-14 DIAGNOSIS — Z5181 Encounter for therapeutic drug level monitoring: Secondary | ICD-10-CM | POA: Diagnosis not present

## 2023-02-14 DIAGNOSIS — R202 Paresthesia of skin: Secondary | ICD-10-CM | POA: Diagnosis not present

## 2023-02-14 DIAGNOSIS — M170 Bilateral primary osteoarthritis of knee: Secondary | ICD-10-CM | POA: Diagnosis not present

## 2023-02-14 DIAGNOSIS — G894 Chronic pain syndrome: Secondary | ICD-10-CM | POA: Diagnosis not present

## 2023-02-14 DIAGNOSIS — M62838 Other muscle spasm: Secondary | ICD-10-CM | POA: Diagnosis not present

## 2023-02-14 DIAGNOSIS — M2550 Pain in unspecified joint: Secondary | ICD-10-CM | POA: Diagnosis not present

## 2023-02-14 DIAGNOSIS — M546 Pain in thoracic spine: Secondary | ICD-10-CM | POA: Diagnosis not present

## 2023-02-14 DIAGNOSIS — S32000S Wedge compression fracture of unspecified lumbar vertebra, sequela: Secondary | ICD-10-CM | POA: Diagnosis not present

## 2023-02-14 MED ORDER — HYDROCODONE-ACETAMINOPHEN 10-325 MG PO TABS: 1.0000 | ORAL_TABLET | Freq: Four times a day (QID) | ORAL | 0 refills | Status: DC | PRN

## 2023-02-14 NOTE — Progress Notes (Signed)
Subjective:    Patient ID: Crystal Lee, female    DOB: 1933/07/16, 89 y.o.   MRN: [id number]  HPI: Crystal Lee is a 89 y.o. female who returns for follow up appointment for chronic pain and medication refill. She states her pain is located in her mid-lower back pain, bilateral lower extremities with tingling and generalizes joint pain. She rates her pain 5. Her current exercise regime is walking and performing stretching exercises.  Crystal Lee equivalent is 40.00 MME.   Last Oral Swab was Performed on 07/22//2024, it was consistent.     Pain Inventory Average Pain 5 Pain Right Now 5 My pain is constant, sharp, burning, tingling, and aching  In the last 24 hours, has pain interfered with the following? General activity 6 Relation with others 5 Enjoyment of life 5 What TIME of day is your pain at its worst? evening and night Sleep (in general) Fair  Pain is worse with: walking, bending, standing, and some activites Pain improves with: rest, heat/ice, and medication Relief from Meds: 3  Family History  Problem Relation Age of Onset   Other Mother        complications from flu   Other Father        unsure of cause   Colon cancer Neg Hx    Social History   Socioeconomic History   Marital status: Widowed    Spouse name: Not on file   Number of children: 3   Years of education: 16 years   Highest education level: Not on file  Occupational History   Occupation: Retired  Tobacco Use   Smoking status: Former    Current packs/day: 0.00    Average packs/day: 0.5 packs/day for 20.0 years (10.0 ttl pk-yrs)    Types: Cigarettes    Start date: 02/03/1956    Quit date: 02/03/1976    Years since quitting: 47.0   Smokeless tobacco: Never  Vaping Use   Vaping status: Never Used  Substance and Sexual Activity   Alcohol use: No   Drug use: No   Sexual activity: Not Currently    Birth control/protection: Post-menopausal  Other Topics Concern   Not on file  Social  History Narrative   Lives at home with husband.   Right-handed.   No caffeine use.   retired   Chemical engineer Strain: Low Risk  (08/07/2021)   Overall Financial Resource Strain (CARDIA)    Difficulty of Paying Living Expenses: Not hard at all  Food Insecurity: No Food Insecurity (08/31/2022)   Hunger Vital Sign    Worried About Running Out of Food in the Last Year: Never true    Ran Out of Food in the Last Year: Never true  Transportation Needs: No Transportation Needs (08/31/2022)   PRAPARE - Administrator, Civil Service (Medical): No    Lack of Transportation (Non-Medical): No  Physical Activity: Inactive (08/31/2022)   Exercise Vital Sign    Days of Exercise per Week: 0 days    Minutes of Exercise per Session: 0 min  Stress: No Stress Concern Present (08/31/2022)   [name] of Occupational Health - Occupational Stress Questionnaire    Feeling of Stress : Not at all  Social Connections: Moderately Isolated (08/07/2021)   Social Connection and Isolation Panel [NHANES]    Frequency of Communication with Friends and Family: More than three times a week    Frequency of Social Gatherings with Friends and Family: More  than three times a week    Attends Religious Services: More than 4 times per year    Active Member of Clubs or Organizations: No    Attends Banker Meetings: Never    Marital Status: Widowed   Past Surgical History:  Procedure Laterality Date   68 HOUR PH STUDY N/A 06/21/2018   Procedure: 24 HOUR PH STUDY;  Surgeon: Shellia Cleverly, DO;  Location: [facility];  Service: Gastroenterology;  Laterality: N/A;  with impedance   ABDOMINAL HYSTERECTOMY  2010   ANAL RECTAL MANOMETRY N/A 09/19/2019   Procedure: ANO RECTAL MANOMETRY;  Surgeon: Napoleon Form, MD;  Location: [facility];  Service: Endoscopy;  Laterality: N/A;   APPENDECTOMY  1949   BACK SURGERY  1973   BIOPSY  06/10/2020   Procedure:  BIOPSY;  Surgeon: Shellia Cleverly, DO;  Location: [facility];  Service: Gastroenterology;;  EGD and COLON   BREAST SURGERY     CATARACT EXTRACTION Bilateral 2011   CHOLECYSTECTOMY     COLON RESECTION N/A 06/12/2020   Procedure: LAPAROSCOPIC COLON RESECTION;  Surgeon: Abigail Miyamoto, MD;  Location: [facility];  Service: General;  Laterality: N/A;   COLONOSCOPY WITH PROPOFOL N/A 06/10/2020   Procedure: COLONOSCOPY WITH PROPOFOL;  Surgeon: Shellia Cleverly, DO;  Location: [facility];  Service: Gastroenterology;  Laterality: N/A;   ESOPHAGEAL MANOMETRY N/A 06/21/2018   Procedure: ESOPHAGEAL MANOMETRY (EM);  Surgeon: Shellia Cleverly, DO;  Location: [facility];  Service: Gastroenterology;  Laterality: N/A;   ESOPHAGOGASTRODUODENOSCOPY (EGD) WITH PROPOFOL N/A 06/10/2020   Procedure: ESOPHAGOGASTRODUODENOSCOPY (EGD) WITH PROPOFOL;  Surgeon: Shellia Cleverly, DO;  [facility]: [facility];  Service: Gastroenterology;  Laterality: N/A;   GALLBLADDER SURGERY  2015   INCISIONAL HERNIA REPAIR N/A 09/16/2021   Procedure: OPEN INCISIONAL HERNIA REPAIR WITH MESH;  Surgeon: Abigail Miyamoto, MD;  Location: [facility];  Service: General;  Laterality: N/A;   JOINT REPLACEMENT     RT TOTAL HIP / RT TOTAL KNEE   MASTECTOMY  1998   BILATERAL    PH IMPEDANCE STUDY  06/21/2018   Procedure: PH IMPEDANCE STUDY;  Surgeon: Shellia Cleverly, DO;  Location: [facility];  Service: Gastroenterology;;   POLYPECTOMY  06/10/2020   Procedure: POLYPECTOMY;  Surgeon: Shellia Cleverly, DO;  [facility]: [facility];  Service: Gastroenterology;;   SKIN CANCER EXCISION  2016   RT SIDE OF NOSE   SUBMUCOSAL TATTOO INJECTION  06/10/2020   Procedure: SUBMUCOSAL TATTOO INJECTION;  Surgeon: Shellia Cleverly, DO;  Location: [facility];  Service: Gastroenterology;;   TONSILLECTOMY  1953   TOTAL HIP ARTHROPLASTY  2010   RIGHT   TOTAL HIP ARTHROPLASTY Left 05/09/2015   Procedure: LEFT TOTAL HIP ARTHROPLASTY ANTERIOR APPROACH;   Surgeon: Ollen Gross, MD;  Location: [facility];  Service: Orthopedics;  Laterality: Left;   TOTAL KNEE ARTHROPLASTY  2001   TUMOR REMOVAL  2012   ABDOMINAL - NON CANCEROUS   Past Surgical History:  Procedure Laterality Date   69 HOUR PH STUDY N/A 06/21/2018   Procedure: 24 HOUR PH STUDY;  Surgeon: Shellia Cleverly, DO;  [facility];  Service: Gastroenterology;  Laterality: N/A;  with impedance   ABDOMINAL HYSTERECTOMY  2010   ANAL RECTAL MANOMETRY N/A 09/19/2019   Procedure: ANO RECTAL MANOMETRY;  Surgeon: Napoleon Form, MD;  [facility];  Service: Endoscopy;  Laterality: N/A;   APPENDECTOMY  1949   BACK SURGERY  1973   BIOPSY  06/10/2020  Procedure: BIOPSY;  Surgeon: Shellia Cleverly, DO;  [facility];  Service: Gastroenterology;;  EGD and COLON   BREAST SURGERY     CATARACT EXTRACTION Bilateral 2011   CHOLECYSTECTOMY     COLON RESECTION N/A 06/12/2020   Procedure: LAPAROSCOPIC COLON RESECTION;  Surgeon: Abigail Miyamoto, MD;  Location: [facility];  Service: General;  Laterality: N/A;   COLONOSCOPY WITH PROPOFOL N/A 06/10/2020   Procedure: COLONOSCOPY WITH PROPOFOL;  Surgeon: Shellia Cleverly, DO;  [facility];  Service: Gastroenterology;  Laterality: N/A;   ESOPHAGEAL MANOMETRY N/A 06/21/2018   Procedure: ESOPHAGEAL MANOMETRY (EM);  Surgeon: Shellia Cleverly, DO;  [facility];  Service: Gastroenterology;  Laterality: N/A;   ESOPHAGOGASTRODUODENOSCOPY (EGD) WITH PROPOFOL N/A 06/10/2020   Procedure: ESOPHAGOGASTRODUODENOSCOPY (EGD) WITH PROPOFOL;  Surgeon: Shellia Cleverly, DO;  [facility];  Service: Gastroenterology;  Laterality: N/A;   GALLBLADDER SURGERY  2015   INCISIONAL HERNIA REPAIR N/A 09/16/2021   Procedure: OPEN INCISIONAL HERNIA REPAIR WITH MESH;  Surgeon: Abigail Miyamoto, MD;  Location: [facility];  Service: General;  Laterality: N/A;   JOINT REPLACEMENT     RT TOTAL HIP / RT TOTAL KNEE   MASTECTOMY  1998    BILATERAL    PH IMPEDANCE STUDY  06/21/2018   Procedure: PH IMPEDANCE STUDY;  Surgeon: Shellia Cleverly, DO;  [facility]: [facility];  Service: Gastroenterology;;   POLYPECTOMY  06/10/2020   Procedure: POLYPECTOMY;  Surgeon: Shellia Cleverly, DO;  [facility];  Service: Gastroenterology;;   SKIN CANCER EXCISION  2016   RT SIDE OF NOSE   SUBMUCOSAL TATTOO INJECTION  06/10/2020   Procedure: SUBMUCOSAL TATTOO INJECTION;  Surgeon: Shellia Cleverly, DO;  [facility]: [facility];  Service: Gastroenterology;;   TONSILLECTOMY  1953   TOTAL HIP ARTHROPLASTY  2010   RIGHT   TOTAL HIP ARTHROPLASTY Left 05/09/2015   Procedure: LEFT TOTAL HIP ARTHROPLASTY ANTERIOR APPROACH;  Surgeon: Ollen Gross, MD;  Location: [facility];  Service: Orthopedics;  Laterality: Left;   TOTAL KNEE ARTHROPLASTY  2001   TUMOR REMOVAL  2012   ABDOMINAL - NON CANCEROUS   Past Medical History:  Diagnosis Date   Arthritis    Cancer (HCC)    HX BREAST CANCER/ SKIN CANCER   Colon cancer (HCC)    Complication of anesthesia    N/V WITH Lee   Difficulty sleeping    Fractured hip (HCC)    LEFT - AUG 2016   GERD (gastroesophageal reflux disease)    Hyperlipidemia    Hypertension    Melanoma (HCC)    Neuropathy    Nocturia    Osteopenia    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   PONV (postoperative nausea and vomiting)    PT STATES Lee CAUSED N/V   Rosacea    Sleep apnea    Stage 1 breast cancer, ER+, right (HCC) 07/04/2017   BP 136/72   Pulse 83   Ht 5\' 6"  (1.676 m)   Wt 212 lb (96.2 kg)   SpO2 94%   BMI 34.22 kg/m   Opioid Risk Score:   Fall Risk Score:  `1  Depression screen [facility] 2/9     01/14/2023   11:07 AM 12/20/2022   11:43 AM 11/15/2022   11:26 AM 10/21/2022   11:16 AM 09/06/2022    3:07 PM 08/31/2022    1:08 PM 08/16/2022   11:13 AM  Depression screen PHQ 2/9  Decreased Interest 0 0 0 0 0 0  0  Down, Depressed, Hopeless 0 0 0 0 0 0 0  PHQ - 2 Score 0 0 0 0 0 0 0      Review of Systems  Constitutional:  Negative for fever and unexpected weight change.  HENT:  Negative for congestion, dental problem, ear pain, nosebleeds, postnasal drip, rhinorrhea, sinus pressure, sneezing, sore throat and trouble swallowing.   Eyes:  Negative for redness and itching.  Respiratory:  Negative for cough, chest tightness, shortness of breath and wheezing.   Cardiovascular:  Negative for palpitations and leg swelling.  Gastrointestinal:  Negative for nausea and vomiting.  Genitourinary:  Negative for dysuria.  Musculoskeletal:  Positive for back pain. Negative for joint swelling.       B/L leg foot pain  Skin:  Negative for rash.  Neurological:  Negative for headaches.  Hematological:  Does not bruise/bleed easily.  Psychiatric/Behavioral:  Negative for dysphoric mood. The patient is not nervous/anxious.   All other systems reviewed and are negative.      Objective:   Physical Exam Vitals and nursing note reviewed.  Constitutional:      Appearance: Normal appearance.  Cardiovascular:     Rate and Rhythm: Normal rate and regular rhythm.     Pulses: Normal pulses.     Heart sounds: Normal heart sounds.  Musculoskeletal:     Comments: Normal Muscle Bulk and Muscle Testing Reveals:  Upper Extremities: Full ROM and Muscle Strength 5/5  Lumbar Paraspinal Tenderness: L-4-L-5 Lower Extremities : Right: Full ROM and Muscle Strength 5/5 Left Lower Extremity: Decreased ROM and Muscle Strength 5/5 Left Lower extremity Flexion Produces Pain into her Left Patella Arises from Chair slowly using walker for support Narrow Based Gait     Neurological:     Mental Status: She is alert.          Assessment & Plan:  1. Chronic Bilateral Leg pain: Paresthesia: Continue to Monitor. 02/14/2023 2. Paresthesia Salli Real Radiculitis: Ms. Leedy has weaned herself off the Lyrica due to daytime drowsiness. We will continue to monitor. 02/14/2023 3. Pain of Left Wrist/ : No  complaints today. S/P Carpal Tunnel Release on 06/03/2017 by Dr. Danielle Dess. Dr. Danielle Dess Following. 02/14/2023. 4. Fracture of superior pubic ramus.  Dr. Lequita Halt Following.  Continue to monitor. 02/14/2023. 5. Bilateral Knee OA: Continue Voltaren Gel.  Continue to monitor. Orthopedist following. 02/14/2023 6. Polyarthralgia: Continue to alternate with heat and ice therapy. Continue current medication regime. Continue to monitor. 02/14/2023. 7. Chronic Pain Syndrome: Refilled::Hydrocodone  10/325 one tablet 4 times a day as needed for pain #120. We will continue the opioid monitoring program, this consists of regular clinic visits, examinations, urine drug screen, pill counts as well as use of [place] Controlled Substance Reporting system. A 12 month History has been reviewed on the [place] Controlled Substance Reporting System on 02/14/2023. 8. Lumbar Compression Fracture L2 and L3:  Ms. Hirano refused physical therapy. Continue with rest/ heat therapy. Continue to Monitor 02/14/2023. 9. Muscle Spasm: Continue  Flexeril 5 mg at HS. Continue to monitor. 02/14/2023 10. Right Shoulder Pain: Refuses X-ray at this time: Will call office if she changes her mind, she verbalizes understanding. Continue HEP as Tolerated. Alternate Ice and Heat Therapy. 02/14/2023  11. Greater Trochanter Bursitis:  No complaints today. Continue to Monitor. Continue HEP as Tolerated. 02/14/2023    F/U in 1 Month

## 2023-02-18 DIAGNOSIS — Z961 Presence of intraocular lens: Secondary | ICD-10-CM | POA: Diagnosis not present

## 2023-02-18 DIAGNOSIS — H52203 Unspecified astigmatism, bilateral: Secondary | ICD-10-CM | POA: Diagnosis not present

## 2023-02-25 DIAGNOSIS — H18893 Other specified disorders of cornea, bilateral: Secondary | ICD-10-CM | POA: Diagnosis not present

## 2023-03-09 ENCOUNTER — Encounter: Payer: Medicare Other | Attending: Physical Medicine & Rehabilitation | Admitting: Registered Nurse

## 2023-03-09 ENCOUNTER — Encounter: Payer: Self-pay | Admitting: Registered Nurse

## 2023-03-09 VITALS — BP 123/72 | HR 80 | Ht 66.0 in | Wt 210.0 lb

## 2023-03-09 DIAGNOSIS — M545 Low back pain, unspecified: Secondary | ICD-10-CM | POA: Diagnosis not present

## 2023-03-09 DIAGNOSIS — R202 Paresthesia of skin: Secondary | ICD-10-CM

## 2023-03-09 DIAGNOSIS — M7062 Trochanteric bursitis, left hip: Secondary | ICD-10-CM | POA: Diagnosis not present

## 2023-03-09 DIAGNOSIS — S32000S Wedge compression fracture of unspecified lumbar vertebra, sequela: Secondary | ICD-10-CM | POA: Diagnosis not present

## 2023-03-09 DIAGNOSIS — M2550 Pain in unspecified joint: Secondary | ICD-10-CM

## 2023-03-09 DIAGNOSIS — Z5181 Encounter for therapeutic drug level monitoring: Secondary | ICD-10-CM

## 2023-03-09 DIAGNOSIS — M170 Bilateral primary osteoarthritis of knee: Secondary | ICD-10-CM

## 2023-03-09 DIAGNOSIS — G894 Chronic pain syndrome: Secondary | ICD-10-CM | POA: Diagnosis not present

## 2023-03-09 DIAGNOSIS — G8929 Other chronic pain: Secondary | ICD-10-CM | POA: Insufficient documentation

## 2023-03-09 DIAGNOSIS — M62838 Other muscle spasm: Secondary | ICD-10-CM

## 2023-03-09 DIAGNOSIS — M7061 Trochanteric bursitis, right hip: Secondary | ICD-10-CM

## 2023-03-09 DIAGNOSIS — Z79891 Long term (current) use of opiate analgesic: Secondary | ICD-10-CM

## 2023-03-09 MED ORDER — HYDROCODONE-ACETAMINOPHEN 10-325 MG PO TABS: 1.0000 | ORAL_TABLET | Freq: Four times a day (QID) | ORAL | 0 refills | Status: DC | PRN

## 2023-03-09 NOTE — Progress Notes (Signed)
Subjective:    Patient ID: Crystal Lee, female    DOB: 09/17/1933, 89 y.o.   MRN: [id number]  HPI: Crystal Lee is a 88 y.o. female who returns for follow up appointment for chronic pain and medication refill. She states her pain is located in her lower back, bilateral hips, bilateral lower extremities and bilateral knee pain. She rates her pain 5. Her current exercise regime is walking short distances with her walker.  Crystal Lee Morphine equivalent is 40.00 MME.   Last Oral Swab was Performed on 12/20/2022, it was consistent.    Pain Inventory Average Pain 5 Pain Right Now 5 My pain is sharp, burning, tingling, and aching  In the last 24 hours, has pain interfered with the following? General activity 6 Relation with others 5 Enjoyment of life 5 What TIME of day is your pain at its worst? evening Sleep (in general) Fair  Pain is worse with: walking, bending, standing, and some activites Pain improves with: rest and heat/ice Relief from Meds: 3  Family History  Problem Relation Age of Onset   Other Mother        complications from flu   Other Father        unsure of cause   Colon cancer Neg Hx    Social History   Socioeconomic History   Marital status: Widowed    Spouse name: Not on file   Number of children: 3   Years of education: 16 years   Highest education level: Not on file  Occupational History   Occupation: Retired  Tobacco Use   Smoking status: Former    Current packs/day: 0.00    Average packs/day: 0.5 packs/day for 20.0 years (10.0 ttl pk-yrs)    Types: Cigarettes    Start date: 02/03/1956    Quit date: 02/03/1976    Years since quitting: 47.1   Smokeless tobacco: Never  Vaping Use   Vaping status: Never Used  Substance and Sexual Activity   Alcohol use: No   Drug use: No   Sexual activity: Not Currently    Birth control/protection: Post-menopausal  Other Topics Concern   Not on file  Social History Narrative   Lives at home with husband.    Right-handed.   No caffeine use.   retired   Chemical engineer Strain: Low Risk  (08/07/2021)   Overall Financial Resource Strain (CARDIA)    Difficulty of Paying Living Expenses: Not hard at all  Food Insecurity: No Food Insecurity (08/31/2022)   Hunger Vital Sign    Worried About Running Out of Food in the Last Year: Never true    Ran Out of Food in the Last Year: Never true  Transportation Needs: No Transportation Needs (08/31/2022)   PRAPARE - Administrator, Civil Service (Medical): No    Lack of Transportation (Non-Medical): No  Physical Activity: Inactive (08/31/2022)   Exercise Vital Sign    Days of Exercise per Week: 0 days    Minutes of Exercise per Session: 0 min  Stress: No Stress Concern Present (08/31/2022)   [name] of Occupational Health - Occupational Stress Questionnaire    Feeling of Stress : Not at all  Social Connections: Moderately Isolated (08/07/2021)   Social Connection and Isolation Panel [NHANES]    Frequency of Communication with Friends and Family: More than three times a week    Frequency of Social Gatherings with Friends and Family: More than three times a week  Attends Religious Services: More than 4 times per year    Active Member of Clubs or Organizations: No    Attends Banker Meetings: Never    Marital Status: Widowed   Past Surgical History:  Procedure Laterality Date   44 HOUR PH STUDY N/A 06/21/2018   Procedure: 24 HOUR PH STUDY;  Surgeon: Shellia Cleverly, DO;  Location: [facility];  Service: Gastroenterology;  Laterality: N/A;  with impedance   ABDOMINAL HYSTERECTOMY  2010   ANAL RECTAL MANOMETRY N/A 09/19/2019   Procedure: ANO RECTAL MANOMETRY;  Surgeon: Napoleon Form, MD;  Location: [facility];  Service: Endoscopy;  Laterality: N/A;   APPENDECTOMY  1949   BACK SURGERY  1973   BIOPSY  06/10/2020   Procedure: BIOPSY;  Surgeon: Shellia Cleverly, DO;  Location: [facility];  Service: Gastroenterology;;  EGD and COLON   BREAST SURGERY     CATARACT EXTRACTION Bilateral 2011   CHOLECYSTECTOMY     COLON RESECTION N/A 06/12/2020   Procedure: LAPAROSCOPIC COLON RESECTION;  Surgeon: Abigail Miyamoto, MD;  Location: [facility];  Service: General;  Laterality: N/A;   COLONOSCOPY WITH PROPOFOL N/A 06/10/2020   Procedure: COLONOSCOPY WITH PROPOFOL;  Surgeon: Shellia Cleverly, DO;  Location: [facility];  Service: Gastroenterology;  Laterality: N/A;   ESOPHAGEAL MANOMETRY N/A 06/21/2018   Procedure: ESOPHAGEAL MANOMETRY (EM);  Surgeon: Shellia Cleverly, DO;  [facility]: [facility];  Service: Gastroenterology;  Laterality: N/A;   ESOPHAGOGASTRODUODENOSCOPY (EGD) WITH PROPOFOL N/A 06/10/2020   Procedure: ESOPHAGOGASTRODUODENOSCOPY (EGD) WITH PROPOFOL;  Surgeon: Shellia Cleverly, DO;  [facility]: [facility];  Service: Gastroenterology;  Laterality: N/A;   GALLBLADDER SURGERY  2015   INCISIONAL HERNIA REPAIR N/A 09/16/2021   Procedure: OPEN INCISIONAL HERNIA REPAIR WITH MESH;  Surgeon: Abigail Miyamoto, MD;  Location: [facility];  Service: General;  Laterality: N/A;   JOINT REPLACEMENT     RT TOTAL HIP / RT TOTAL KNEE   MASTECTOMY  1998   BILATERAL    PH IMPEDANCE STUDY  06/21/2018   Procedure: PH IMPEDANCE STUDY;  Surgeon: Shellia Cleverly, DO;  Location: [facility];  Service: Gastroenterology;;   POLYPECTOMY  06/10/2020   Procedure: POLYPECTOMY;  Surgeon: Shellia Cleverly, DO;  [facility]: [facility];  Service: Gastroenterology;;   SKIN CANCER EXCISION  2016   RT SIDE OF NOSE   SUBMUCOSAL TATTOO INJECTION  06/10/2020   Procedure: SUBMUCOSAL TATTOO INJECTION;  Surgeon: Shellia Cleverly, DO;  [facility]: [facility];  Service: Gastroenterology;;   TONSILLECTOMY  1953   TOTAL HIP ARTHROPLASTY  2010   RIGHT   TOTAL HIP ARTHROPLASTY Left 05/09/2015   Procedure: LEFT TOTAL HIP ARTHROPLASTY ANTERIOR APPROACH;  Surgeon: Ollen Gross, MD;  Location: [facility];  Service:  Orthopedics;  Laterality: Left;   TOTAL KNEE ARTHROPLASTY  2001   TUMOR REMOVAL  2012   ABDOMINAL - NON CANCEROUS   Past Surgical History:  Procedure Laterality Date   86 HOUR PH STUDY N/A 06/21/2018   Procedure: 24 HOUR PH STUDY;  Surgeon: Shellia Cleverly, DO;  [facility];  Service: Gastroenterology;  Laterality: N/A;  with impedance   ABDOMINAL HYSTERECTOMY  2010   ANAL RECTAL MANOMETRY N/A 09/19/2019   Procedure: ANO RECTAL MANOMETRY;  Surgeon: Napoleon Form, MD;  [facility];  Service: Endoscopy;  Laterality: N/A;   APPENDECTOMY  1949   BACK SURGERY  1973   BIOPSY  06/10/2020   Procedure: BIOPSY;  Surgeon: Shellia Cleverly, DO;  [facility];  Service: Gastroenterology;;  EGD and COLON   BREAST SURGERY     CATARACT EXTRACTION Bilateral 2011   CHOLECYSTECTOMY     COLON RESECTION N/A 06/12/2020   Procedure: LAPAROSCOPIC COLON RESECTION;  Surgeon: Abigail Miyamoto, MD;  Location: [facility];  Service: General;  Laterality: N/A;   COLONOSCOPY WITH PROPOFOL N/A 06/10/2020   Procedure: COLONOSCOPY WITH PROPOFOL;  Surgeon: Shellia Cleverly, DO;  [facility];  Service: Gastroenterology;  Laterality: N/A;   ESOPHAGEAL MANOMETRY N/A 06/21/2018   Procedure: ESOPHAGEAL MANOMETRY (EM);  Surgeon: Shellia Cleverly, DO;  [facility];  Service: Gastroenterology;  Laterality: N/A;   ESOPHAGOGASTRODUODENOSCOPY (EGD) WITH PROPOFOL N/A 06/10/2020   Procedure: ESOPHAGOGASTRODUODENOSCOPY (EGD) WITH PROPOFOL;  Surgeon: Shellia Cleverly, DO;  [facility];  Service: Gastroenterology;  Laterality: N/A;   GALLBLADDER SURGERY  2015   INCISIONAL HERNIA REPAIR N/A 09/16/2021   Procedure: OPEN INCISIONAL HERNIA REPAIR WITH MESH;  Surgeon: Abigail Miyamoto, MD;  [facility];  Service: General;  Laterality: N/A;   JOINT REPLACEMENT     RT TOTAL HIP / RT TOTAL KNEE   MASTECTOMY  1998   BILATERAL    PH IMPEDANCE STUDY  06/21/2018    Procedure: PH IMPEDANCE STUDY;  Surgeon: Shellia Cleverly, DO;  [facility];  Service: Gastroenterology;;   POLYPECTOMY  06/10/2020   Procedure: POLYPECTOMY;  Surgeon: Shellia Cleverly, DO;  [facility];  Service: Gastroenterology;;   SKIN CANCER EXCISION  2016   RT SIDE OF NOSE   SUBMUCOSAL TATTOO INJECTION  06/10/2020   Procedure: SUBMUCOSAL TATTOO INJECTION;  Surgeon: Shellia Cleverly, DO;  [facility];  Service: Gastroenterology;;   TONSILLECTOMY  1953   TOTAL HIP ARTHROPLASTY  2010   RIGHT   TOTAL HIP ARTHROPLASTY Left 05/09/2015   Procedure: LEFT TOTAL HIP ARTHROPLASTY ANTERIOR APPROACH;  Surgeon: Ollen Gross, MD;  Location: [facility];  Service: Orthopedics;  Laterality: Left;   TOTAL KNEE ARTHROPLASTY  2001   TUMOR REMOVAL  2012   ABDOMINAL - NON CANCEROUS   Past Medical History:  Diagnosis Date   Arthritis    Cancer (HCC)    HX BREAST CANCER/ SKIN CANCER   Colon cancer (HCC)    Complication of anesthesia    N/V WITH MORPHINE   Difficulty sleeping    Fractured hip (HCC)    LEFT - AUG 2016   GERD (gastroesophageal reflux disease)    Hyperlipidemia    Hypertension    Melanoma (HCC)    Neuropathy    Nocturia    Osteopenia    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   PONV (postoperative nausea and vomiting)    PT STATES MORPHINE CAUSED N/V   Rosacea    Sleep apnea    Stage 1 breast cancer, ER+, right (HCC) 07/04/2017   Ht 5\' 6"  (1.676 m)   BMI 34.22 kg/m   Opioid Risk Score:   Fall Risk Score:  `1  Depression screen [facility] 2/9     02/14/2023   11:05 AM 01/14/2023   11:07 AM 12/20/2022   11:43 AM 11/15/2022   11:26 AM 10/21/2022   11:16 AM 09/06/2022    3:07 PM 08/31/2022    1:08 PM  Depression screen PHQ 2/9  Decreased Interest 0 0 0 0 0 0 0  Down, Depressed, Hopeless 0 0 0 0 0 0 0  PHQ - 2 Score 0 0 0 0 0 0 0  Review of Systems  Musculoskeletal:  Positive for back pain and gait problem.  All other systems  reviewed and are negative.     Objective:   Physical Exam Vitals and nursing note reviewed.  Constitutional:      Appearance: Normal appearance.  Cardiovascular:     Rate and Rhythm: Normal rate and regular rhythm.     Pulses: Normal pulses.     Heart sounds: Normal heart sounds.  Pulmonary:     Effort: Pulmonary effort is normal.     Breath sounds: Normal breath sounds.  Musculoskeletal:     Cervical back: Normal range of motion and neck supple.     Comments: Normal Muscle Bulk and Muscle Testing Reveals:  Upper Extremities: Full ROM and Muscle Strength 5/5 Lumbar Paraspinal Tenderness: L-3-L-5 Lower Extremities: Full ROM and Muscle Strength 5/5 Arises from Chair slowly using walker for support Narrow Based Gait     Skin:    General: Skin is warm and dry.  Neurological:     Mental Status: She is alert and oriented to person, place, and time.  Psychiatric:        Mood and Affect: Mood normal.        Behavior: Behavior normal.         Assessment & Plan:  1. Chronic Bilateral Leg pain: Paresthesia: Continue to Monitor. 03/09/2023 2. Paresthesia Salli Real Radiculitis: Ms. Mcpheron has weaned herself off the Lyrica due to daytime drowsiness. We will continue to monitor. 03/09/2023 3. Pain of Left Wrist/ : No complaints today. S/P Carpal Tunnel Release on 06/03/2017 by Dr. Danielle Dess. Dr. Danielle Dess Following. 03/09/2023. 4. Fracture of superior pubic ramus.  Dr. Lequita Halt Following.  Continue to monitor. 03/09/2023. 5. Bilateral Knee OA: Continue Voltaren Gel.  Continue to monitor. Orthopedist following. 03/09/2023 6. Polyarthralgia: Continue to alternate with heat and ice therapy. Continue current medication regime. Continue to monitor. 03/09/2023. 7. Chronic Pain Syndrome: Refilled::Hydrocodone  10/325 one tablet 4 times a day as needed for pain #120. We will continue the opioid monitoring program, this consists of regular clinic visits, examinations, urine drug screen, pill counts as well  as use of [place] Controlled Substance Reporting system. A 12 month History has been reviewed on the [place] Controlled Substance Reporting System on 03/09/2023. 8. Lumbar Compression Fracture L2 and L3:  Ms. Swanigan refused physical therapy. Continue with rest/ heat therapy. Continue to Monitor 03/09/2023. 9. Muscle Spasm: Continue  Flexeril 5 mg at HS. Continue to monitor. 03/09/2023 10. Right Shoulder Pain: No complaints today. Continue HEP as Tolerated. Alternate Ice and Heat Therapy. 03/09/2023  11. Greater Trochanter Bursitis:  Continue to Monitor. Continue HEP as Tolerated. 03/09/2023  12. Near Miss Fall:Ms. Woller denies falling or losing her balance:  Educated on falls Prevention. She verbalizes understanding.    F/U in 1 Month

## 2023-03-16 ENCOUNTER — Ambulatory Visit: Payer: TRICARE For Life (TFL) | Admitting: Registered Nurse

## 2023-04-11 ENCOUNTER — Encounter: Payer: Medicare Other | Attending: Physical Medicine & Rehabilitation | Admitting: Registered Nurse

## 2023-04-11 ENCOUNTER — Encounter: Payer: Self-pay | Admitting: Registered Nurse

## 2023-04-11 VITALS — BP 136/73 | HR 70 | Ht 66.0 in | Wt 210.0 lb

## 2023-04-11 DIAGNOSIS — M62838 Other muscle spasm: Secondary | ICD-10-CM

## 2023-04-11 DIAGNOSIS — R202 Paresthesia of skin: Secondary | ICD-10-CM | POA: Diagnosis not present

## 2023-04-11 DIAGNOSIS — Z79891 Long term (current) use of opiate analgesic: Secondary | ICD-10-CM

## 2023-04-11 DIAGNOSIS — M2550 Pain in unspecified joint: Secondary | ICD-10-CM | POA: Diagnosis not present

## 2023-04-11 DIAGNOSIS — M545 Low back pain, unspecified: Secondary | ICD-10-CM | POA: Diagnosis not present

## 2023-04-11 DIAGNOSIS — S32000S Wedge compression fracture of unspecified lumbar vertebra, sequela: Secondary | ICD-10-CM

## 2023-04-11 DIAGNOSIS — M7062 Trochanteric bursitis, left hip: Secondary | ICD-10-CM | POA: Insufficient documentation

## 2023-04-11 DIAGNOSIS — M546 Pain in thoracic spine: Secondary | ICD-10-CM | POA: Diagnosis not present

## 2023-04-11 DIAGNOSIS — G8929 Other chronic pain: Secondary | ICD-10-CM

## 2023-04-11 DIAGNOSIS — M170 Bilateral primary osteoarthritis of knee: Secondary | ICD-10-CM | POA: Diagnosis not present

## 2023-04-11 DIAGNOSIS — G894 Chronic pain syndrome: Secondary | ICD-10-CM

## 2023-04-11 DIAGNOSIS — Z5181 Encounter for therapeutic drug level monitoring: Secondary | ICD-10-CM

## 2023-04-11 DIAGNOSIS — M7061 Trochanteric bursitis, right hip: Secondary | ICD-10-CM

## 2023-04-11 MED ORDER — HYDROCODONE-ACETAMINOPHEN 10-325 MG PO TABS: 1.0000 | ORAL_TABLET | Freq: Four times a day (QID) | ORAL | 0 refills | Status: DC | PRN

## 2023-04-11 NOTE — Progress Notes (Signed)
Subjective:    Patient ID: Crystal Lee, female    DOB: 03-28-1934, 89 y.o.   MRN: [id number]  HPI: Crystal Lee is a 89 y.o. female who returns for follow up appointment for chronic pain and medication refill. She states her pain is located in her mid- lower back, bilateral hips, bilateral knees and bilateral lower extremities with tingling and numbness. She also reports generalized joint pain. She rates her pain 5. Her current exercise regime is walking short distances with her walker.   [name] Morphine equivalent is 40.00 MME.   Oral Swab was Performed today.    Pain Inventory Average Pain 5 Pain Right Now 5 My pain is sharp, burning, tingling, and aching  In the last 24 hours, has pain interfered with the following? General activity 5 Relation with others 5 Enjoyment of life 5 What TIME of day is your pain at its worst? evening Sleep (in general) Fair  Pain is worse with: walking, bending, standing, and some activites Pain improves with: rest, heat/ice, and medication Relief from Meds: 3  Family History  Problem Relation Age of Onset   Other Mother        complications from flu   Other Father        unsure of cause   Colon cancer Neg Hx    Social History   Socioeconomic History   Marital status: Widowed    Spouse name: Not on file   Number of children: 3   Years of education: 16 years   Highest education level: Not on file  Occupational History   Occupation: Retired  Tobacco Use   Smoking status: Former    Current packs/day: 0.00    Average packs/day: 0.5 packs/day for 20.0 years (10.0 ttl pk-yrs)    Types: Cigarettes    Start date: 02/03/1956    Quit date: 02/03/1976    Years since quitting: 47.2   Smokeless tobacco: Never  Vaping Use   Vaping status: Never Used  Substance and Sexual Activity   Alcohol use: No   Drug use: No   Sexual activity: Not Currently    Birth control/protection: Post-menopausal  Other Topics Concern   Not on file  Social  History Narrative   Lives at home with husband.   Right-handed.   No caffeine use.   retired   Chemical engineer Strain: Low Risk  (08/07/2021)   Overall Financial Resource Strain (CARDIA)    Difficulty of Paying Living Expenses: Not hard at all  Food Insecurity: No Food Insecurity (08/31/2022)   Hunger Vital Sign    Worried About Running Out of Food in the Last Year: Never true    Ran Out of Food in the Last Year: Never true  Transportation Needs: No Transportation Needs (08/31/2022)   PRAPARE - Administrator, Civil Service (Medical): No    Lack of Transportation (Non-Medical): No  Physical Activity: Inactive (08/31/2022)   Exercise Vital Sign    Days of Exercise per Week: 0 days    Minutes of Exercise per Session: 0 min  Stress: No Stress Concern Present (08/31/2022)   [name] of Occupational Health - Occupational Stress Questionnaire    Feeling of Stress : Not at all  Social Connections: Moderately Isolated (08/07/2021)   Social Connection and Isolation Panel [NHANES]    Frequency of Communication with Friends and Family: More than three times a week    Frequency of Social Gatherings with Friends and  Family: More than three times a week    Attends Religious Services: More than 4 times per year    Active Member of Clubs or Organizations: No    Attends Banker Meetings: Never    Marital Status: Widowed   Past Surgical History:  Procedure Laterality Date   45 HOUR PH STUDY N/A 06/21/2018   Procedure: 24 HOUR PH STUDY;  Surgeon: Shellia Cleverly, DO;  Location: [facility];  Service: Gastroenterology;  Laterality: N/A;  with impedance   ABDOMINAL HYSTERECTOMY  2010   ANAL RECTAL MANOMETRY N/A 09/19/2019   Procedure: ANO RECTAL MANOMETRY;  Surgeon: Napoleon Form, MD;  Location: [facility];  Service: Endoscopy;  Laterality: N/A;   APPENDECTOMY  1949   BACK SURGERY  1973   BIOPSY  06/10/2020   Procedure:  BIOPSY;  Surgeon: Shellia Cleverly, DO;  Location: [facility];  Service: Gastroenterology;;  EGD and COLON   BREAST SURGERY     CATARACT EXTRACTION Bilateral 2011   CHOLECYSTECTOMY     COLON RESECTION N/A 06/12/2020   Procedure: LAPAROSCOPIC COLON RESECTION;  Surgeon: Abigail Miyamoto, MD;  Location: [facility];  Service: General;  Laterality: N/A;   COLONOSCOPY WITH PROPOFOL N/A 06/10/2020   Procedure: COLONOSCOPY WITH PROPOFOL;  Surgeon: Shellia Cleverly, DO;  Location: [facility];  Service: Gastroenterology;  Laterality: N/A;   ESOPHAGEAL MANOMETRY N/A 06/21/2018   Procedure: ESOPHAGEAL MANOMETRY (EM);  Surgeon: Shellia Cleverly, DO;  Location: [facility];  Service: Gastroenterology;  Laterality: N/A;   ESOPHAGOGASTRODUODENOSCOPY (EGD) WITH PROPOFOL N/A 06/10/2020   Procedure: ESOPHAGOGASTRODUODENOSCOPY (EGD) WITH PROPOFOL;  Surgeon: Shellia Cleverly, DO;  [facility]: [facility];  Service: Gastroenterology;  Laterality: N/A;   GALLBLADDER SURGERY  2015   INCISIONAL HERNIA REPAIR N/A 09/16/2021   Procedure: OPEN INCISIONAL HERNIA REPAIR WITH MESH;  Surgeon: Abigail Miyamoto, MD;  Location: [facility];  Service: General;  Laterality: N/A;   JOINT REPLACEMENT     RT TOTAL HIP / RT TOTAL KNEE   MASTECTOMY  1998   BILATERAL    PH IMPEDANCE STUDY  06/21/2018   Procedure: PH IMPEDANCE STUDY;  Surgeon: Shellia Cleverly, DO;  Location: [facility];  Service: Gastroenterology;;   POLYPECTOMY  06/10/2020   Procedure: POLYPECTOMY;  Surgeon: Shellia Cleverly, DO;  Location: [facility];  Service: Gastroenterology;;   SKIN CANCER EXCISION  2016   RT SIDE OF NOSE   SUBMUCOSAL TATTOO INJECTION  06/10/2020   Procedure: SUBMUCOSAL TATTOO INJECTION;  Surgeon: Shellia Cleverly, DO;  Location: [facility];  Service: Gastroenterology;;   TONSILLECTOMY  1953   TOTAL HIP ARTHROPLASTY  2010   RIGHT   TOTAL HIP ARTHROPLASTY Left 05/09/2015   Procedure: LEFT TOTAL HIP ARTHROPLASTY ANTERIOR APPROACH;   Surgeon: Ollen Gross, MD;  Location: [facility];  Service: Orthopedics;  Laterality: Left;   TOTAL KNEE ARTHROPLASTY  2001   TUMOR REMOVAL  2012   ABDOMINAL - NON CANCEROUS   Past Surgical History:  Procedure Laterality Date   30 HOUR PH STUDY N/A 06/21/2018   Procedure: 24 HOUR PH STUDY;  Surgeon: Shellia Cleverly, DO;  [facility];  Service: Gastroenterology;  Laterality: N/A;  with impedance   ABDOMINAL HYSTERECTOMY  2010   ANAL RECTAL MANOMETRY N/A 09/19/2019   Procedure: ANO RECTAL MANOMETRY;  Surgeon: Napoleon Form, MD;  [facility];  Service: Endoscopy;  Laterality: N/A;   APPENDECTOMY  1949   BACK SURGERY  1973   BIOPSY  06/10/2020  Procedure: BIOPSY;  Surgeon: Shellia Cleverly, DO;  [facility];  Service: Gastroenterology;;  EGD and COLON   BREAST SURGERY     CATARACT EXTRACTION Bilateral 2011   CHOLECYSTECTOMY     COLON RESECTION N/A 06/12/2020   Procedure: LAPAROSCOPIC COLON RESECTION;  Surgeon: Abigail Miyamoto, MD;  Location: [facility];  Service: General;  Laterality: N/A;   COLONOSCOPY WITH PROPOFOL N/A 06/10/2020   Procedure: COLONOSCOPY WITH PROPOFOL;  Surgeon: Shellia Cleverly, DO;  [facility]: [facility];  Service: Gastroenterology;  Laterality: N/A;   ESOPHAGEAL MANOMETRY N/A 06/21/2018   Procedure: ESOPHAGEAL MANOMETRY (EM);  Surgeon: Shellia Cleverly, DO;  [facility];  Service: Gastroenterology;  Laterality: N/A;   ESOPHAGOGASTRODUODENOSCOPY (EGD) WITH PROPOFOL N/A 06/10/2020   Procedure: ESOPHAGOGASTRODUODENOSCOPY (EGD) WITH PROPOFOL;  Surgeon: Shellia Cleverly, DO;  [facility];  Service: Gastroenterology;  Laterality: N/A;   GALLBLADDER SURGERY  2015   INCISIONAL HERNIA REPAIR N/A 09/16/2021   Procedure: OPEN INCISIONAL HERNIA REPAIR WITH MESH;  Surgeon: Abigail Miyamoto, MD;  Location: [facility];  Service: General;  Laterality: N/A;   JOINT REPLACEMENT     RT TOTAL HIP / RT TOTAL KNEE   MASTECTOMY  1998    BILATERAL    PH IMPEDANCE STUDY  06/21/2018   Procedure: PH IMPEDANCE STUDY;  Surgeon: Shellia Cleverly, DO;  [facility]: [facility];  Service: Gastroenterology;;   POLYPECTOMY  06/10/2020   Procedure: POLYPECTOMY;  Surgeon: Shellia Cleverly, DO;  [facility];  Service: Gastroenterology;;   SKIN CANCER EXCISION  2016   RT SIDE OF NOSE   SUBMUCOSAL TATTOO INJECTION  06/10/2020   Procedure: SUBMUCOSAL TATTOO INJECTION;  Surgeon: Shellia Cleverly, DO;  [facility]: [facility];  Service: Gastroenterology;;   TONSILLECTOMY  1953   TOTAL HIP ARTHROPLASTY  2010   RIGHT   TOTAL HIP ARTHROPLASTY Left 05/09/2015   Procedure: LEFT TOTAL HIP ARTHROPLASTY ANTERIOR APPROACH;  Surgeon: Ollen Gross, MD;  Location: [facility];  Service: Orthopedics;  Laterality: Left;   TOTAL KNEE ARTHROPLASTY  2001   TUMOR REMOVAL  2012   ABDOMINAL - NON CANCEROUS   Past Medical History:  Diagnosis Date   Arthritis    Cancer (HCC)    HX BREAST CANCER/ SKIN CANCER   Colon cancer (HCC)    Complication of anesthesia    N/V WITH MORPHINE   Difficulty sleeping    Fractured hip (HCC)    LEFT - AUG 2016   GERD (gastroesophageal reflux disease)    Hyperlipidemia    Hypertension    Melanoma (HCC)    Neuropathy    Nocturia    Osteopenia    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  Opioid Risk Score:   Fall Risk Score:  `1  Depression screen [facility] 2/9     02/14/2023   11:05 AM 01/14/2023   11:07 AM 12/20/2022   11:43 AM 11/15/2022   11:26 AM 10/21/2022   11:16 AM 09/06/2022    3:07 PM 08/31/2022    1:08 PM  Depression screen PHQ 2/9  Decreased Interest 0 0 0 0 0 0 0  Down, Depressed, Hopeless 0 0 0 0 0 0 0  PHQ - 2 Score 0 0 0 0 0  0 0      Review of Systems  Musculoskeletal:  Positive  for back pain and gait problem.  All other systems reviewed and are negative.     Objective:   Physical Exam Vitals and nursing note reviewed.  Constitutional:      Appearance: Normal appearance. She is obese.  Neck:     Comments: Cervical Paraspinal Tenderness: C-5-C-6 Cardiovascular:     Rate and Rhythm: Normal rate and regular rhythm.     Pulses: Normal pulses.     Heart sounds: Normal heart sounds.  Pulmonary:     Effort: Pulmonary effort is normal.     Breath sounds: Normal breath sounds.  Musculoskeletal:     Comments: Normal Muscle Bulk and Muscle Testing Reveals:  Upper Extremities: Full ROM and Muscle Strength 5/5  Thoracic Hypersensitivity Lumbar Paraspinal Tenderness: L-4-L-5 Lower Extremities: Decreased ROM and Muscle Strength 5/5 Bilateral Lower Extremities Flexion Produces Pain into her Bilateral Patella's Arises from Table slowly using Walker for support Narrow Based  Gait     Skin:    General: Skin is warm and dry.  Neurological:     Mental Status: She is alert and oriented to person, place, and time.  Psychiatric:        Mood and Affect: Mood normal.        Behavior: Behavior normal.         Assessment & Plan:  1. Chronic Bilateral Leg pain: Paresthesia: Continue to Monitor. 04/11/2023 2. Paresthesia Salli Real Radiculitis: Ms. Giegerich has weaned herself off the Lyrica due to daytime drowsiness. We will continue to monitor. 04/11/2023 3. Pain of Left Wrist/ : No complaints today. S/P Carpal Tunnel Release on 06/03/2017 by Dr. Danielle Dess. Dr. Danielle Dess Following. 04/11/2023. 4. Fracture of superior pubic ramus.  Dr. Lequita Halt Following.  Continue to monitor. 04/11/2023. 5. Bilateral Knee OA: Continue Voltaren Gel.  Continue to monitor. Orthopedist following. 04/11/2023 6. Polyarthralgia: Continue to alternate with heat and ice therapy. Continue current medication regime. Continue to monitor. 04/11/2023. 7. Chronic Pain Syndrome: Refilled::Hydrocodone  10/325 one  tablet 4 times a day as needed for pain #120. We will continue the opioid monitoring program, this consists of regular clinic visits, examinations, urine drug screen, pill counts as well as use of [place] Controlled Substance Reporting system. A 12 month History has been reviewed on the [place] Controlled Substance Reporting System on 04/11/2023. 8. Lumbar Compression Fracture L2 and L3:  Ms. Mcmunn refused physical therapy. Continue with rest/ heat therapy. Continue to Monitor 04/11/2023. 9. Muscle Spasm: Continue  Flexeril 5 mg at HS. Continue to monitor. 04/11/2023 10. Right Shoulder Pain: No complaints today. Continue HEP as Tolerated. Alternate Ice and Heat Therapy. 04/11/2023  11. Greater Trochanter Bursitis:  Continue to Monitor. Continue HEP as Tolerated. 04/11/2023    F/U in 1 Month

## 2023-04-15 LAB — DRUG TOX MONITOR 1 W/CONF, ORAL FLD
Amphetamines: NEGATIVE ng/mL (ref <10)
Barbiturates: NEGATIVE ng/mL (ref <10)
Benzodiazepines: NEGATIVE ng/mL (ref <0.50)
Buprenorphine: NEGATIVE ng/mL (ref <0.10)
Cocaine: NEGATIVE ng/mL (ref <5.0)
Codeine: NEGATIVE ng/mL (ref <2.5)
Dihydrocodeine: 19.7 ng/mL — ABNORMAL HIGH (ref <2.5)
Fentanyl: NEGATIVE ng/mL (ref <0.10)
Heroin Metabolite: NEGATIVE ng/mL (ref <1.0)
Hydrocodone: 171.7 ng/mL — ABNORMAL HIGH (ref <2.5)
Hydromorphone: NEGATIVE ng/mL (ref <2.5)
MARIJUANA: NEGATIVE ng/mL (ref <2.5)
MDMA: NEGATIVE ng/mL (ref <10)
Meprobamate: NEGATIVE ng/mL (ref <2.5)
Methadone: NEGATIVE ng/mL (ref <5.0)
Morphine: NEGATIVE ng/mL (ref <2.5)
Nicotine Metabolite: NEGATIVE ng/mL (ref <5.0)
Norhydrocodone: 11.8 ng/mL — ABNORMAL HIGH (ref <2.5)
Noroxycodone: NEGATIVE ng/mL (ref <2.5)
Opiates: POSITIVE ng/mL — AB (ref <2.5)
Oxycodone: NEGATIVE ng/mL (ref <2.5)
Oxymorphone: NEGATIVE ng/mL (ref <2.5)
Phencyclidine: NEGATIVE ng/mL (ref <10)
Tapentadol: NEGATIVE ng/mL (ref <5.0)
Tramadol: NEGATIVE ng/mL (ref <5.0)
Zolpidem: NEGATIVE ng/mL (ref <5.0)

## 2023-04-15 LAB — DRUG TOX ALC METAB W/CON, ORAL FLD: Alcohol Metabolite: NEGATIVE ng/mL (ref <25)

## 2023-05-10 ENCOUNTER — Encounter: Payer: Medicare Other | Attending: Physical Medicine & Rehabilitation | Admitting: Registered Nurse

## 2023-05-10 ENCOUNTER — Encounter: Payer: Self-pay | Admitting: Registered Nurse

## 2023-05-10 VITALS — BP 136/74 | HR 75 | Ht 66.0 in | Wt 209.0 lb

## 2023-05-10 DIAGNOSIS — Z5181 Encounter for therapeutic drug level monitoring: Secondary | ICD-10-CM

## 2023-05-10 DIAGNOSIS — Z79891 Long term (current) use of opiate analgesic: Secondary | ICD-10-CM | POA: Diagnosis not present

## 2023-05-10 DIAGNOSIS — G8929 Other chronic pain: Secondary | ICD-10-CM | POA: Diagnosis not present

## 2023-05-10 DIAGNOSIS — M7062 Trochanteric bursitis, left hip: Secondary | ICD-10-CM | POA: Diagnosis not present

## 2023-05-10 DIAGNOSIS — G894 Chronic pain syndrome: Secondary | ICD-10-CM | POA: Diagnosis not present

## 2023-05-10 DIAGNOSIS — M546 Pain in thoracic spine: Secondary | ICD-10-CM | POA: Diagnosis not present

## 2023-05-10 DIAGNOSIS — R202 Paresthesia of skin: Secondary | ICD-10-CM | POA: Diagnosis not present

## 2023-05-10 DIAGNOSIS — M5416 Radiculopathy, lumbar region: Secondary | ICD-10-CM

## 2023-05-10 MED ORDER — HYDROCODONE-ACETAMINOPHEN 10-325 MG PO TABS: 1.0000 | ORAL_TABLET | Freq: Four times a day (QID) | ORAL | 0 refills | Status: DC | PRN

## 2023-05-10 NOTE — Progress Notes (Signed)
Subjective:    Patient ID: Crystal Lee, female    DOB: Nov 18, 1933, 89 y.o.   MRN: [id number]  HPI: Crystal Lee is a 89 y.o. female who returns for follow up appointment for chronic pain and medication refill. She states her pain is located in her  mid- back, lower back pain radiating into her left lower extremity. She also reports left gip pain and bilateral lower extremities with tingling.  She rates her pain 5. Her current exercise regime is walking and performing stretching exercises.  Ms. Picker Morphine equivalent is 40.00 MME.   Last Oral Swab was Performed on 04/11/2023, it was consistent.     Pain Inventory Average Pain 5 Pain Right Now 5 My pain is sharp, stabbing, tingling, and aching  In the last 24 hours, has pain interfered with the following? General activity 5 Relation with others 5 Enjoyment of life 5 What TIME of day is your pain at its worst? evening Sleep (in general) Fair  Pain is worse with: walking, bending, standing, and some activites Pain improves with: rest, heat/ice, and medication Relief from Meds: 3  Family History  Problem Relation Age of Onset   Other Mother        complications from flu   Other Father        unsure of cause   Colon cancer Neg Hx    Social History   Socioeconomic History   Marital status: Widowed    Spouse name: Not on file   Number of children: 3   Years of education: 16 years   Highest education level: Not on file  Occupational History   Occupation: Retired  Tobacco Use   Smoking status: Former    Current packs/day: 0.00    Average packs/day: 0.5 packs/day for 20.0 years (10.0 ttl pk-yrs)    Types: Cigarettes    Start date: 02/03/1956    Quit date: 02/03/1976    Years since quitting: 47.2   Smokeless tobacco: Never  Vaping Use   Vaping status: Never Used  Substance and Sexual Activity   Alcohol use: No   Drug use: No   Sexual activity: Not Currently    Birth control/protection: Post-menopausal  Other Topics  Concern   Not on file  Social History Narrative   Lives at home with husband.   Right-handed.   No caffeine use.   retired   Chemical engineer Strain: Low Risk  (08/07/2021)   Overall Financial Resource Strain (CARDIA)    Difficulty of Paying Living Expenses: Not hard at all  Food Insecurity: No Food Insecurity (08/31/2022)   Hunger Vital Sign    Worried About Running Out of Food in the Last Year: Never true    Ran Out of Food in the Last Year: Never true  Transportation Needs: No Transportation Needs (08/31/2022)   PRAPARE - Administrator, Civil Service (Medical): No    Lack of Transportation (Non-Medical): No  Physical Activity: Inactive (08/31/2022)   Exercise Vital Sign    Days of Exercise per Week: 0 days    Minutes of Exercise per Session: 0 min  Stress: No Stress Concern Present (08/31/2022)   [name] of Occupational Health - Occupational Stress Questionnaire    Feeling of Stress : Not at all  Social Connections: Moderately Isolated (08/07/2021)   Social Connection and Isolation Panel [NHANES]    Frequency of Communication with Friends and Family: More than three times a week  Frequency of Social Gatherings with Friends and Family: More than three times a week    Attends Religious Services: More than 4 times per year    Active Member of Clubs or Organizations: No    Attends Banker Meetings: Never    Marital Status: Widowed   Past Surgical History:  Procedure Laterality Date   9 HOUR PH STUDY N/A 06/21/2018   Procedure: 24 HOUR PH STUDY;  Surgeon: Shellia Cleverly, DO;  Location: [facility];  Service: Gastroenterology;  Laterality: N/A;  with impedance   ABDOMINAL HYSTERECTOMY  2010   ANAL RECTAL MANOMETRY N/A 09/19/2019   Procedure: ANO RECTAL MANOMETRY;  Surgeon: Napoleon Form, MD;  Location: [facility];  Service: Endoscopy;  Laterality: N/A;   APPENDECTOMY  1949   BACK SURGERY  1973   BIOPSY   06/10/2020   Procedure: BIOPSY;  Surgeon: Shellia Cleverly, DO;  Location: [facility];  Service: Gastroenterology;;  EGD and COLON   BREAST SURGERY     CATARACT EXTRACTION Bilateral 2011   CHOLECYSTECTOMY     COLON RESECTION N/A 06/12/2020   Procedure: LAPAROSCOPIC COLON RESECTION;  Surgeon: Abigail Miyamoto, MD;  Location: [facility];  Service: General;  Laterality: N/A;   COLONOSCOPY WITH PROPOFOL N/A 06/10/2020   Procedure: COLONOSCOPY WITH PROPOFOL;  Surgeon: Shellia Cleverly, DO;  Location: [facility];  Service: Gastroenterology;  Laterality: N/A;   ESOPHAGEAL MANOMETRY N/A 06/21/2018   Procedure: ESOPHAGEAL MANOMETRY (EM);  Surgeon: Shellia Cleverly, DO;  [facility]: [facility];  Service: Gastroenterology;  Laterality: N/A;   ESOPHAGOGASTRODUODENOSCOPY (EGD) WITH PROPOFOL N/A 06/10/2020   Procedure: ESOPHAGOGASTRODUODENOSCOPY (EGD) WITH PROPOFOL;  Surgeon: Shellia Cleverly, DO;  [facility]: [facility];  Service: Gastroenterology;  Laterality: N/A;   GALLBLADDER SURGERY  2015   INCISIONAL HERNIA REPAIR N/A 09/16/2021   Procedure: OPEN INCISIONAL HERNIA REPAIR WITH MESH;  Surgeon: Abigail Miyamoto, MD;  [facility]: [facility];  Service: General;  Laterality: N/A;   JOINT REPLACEMENT     RT TOTAL HIP / RT TOTAL KNEE   MASTECTOMY  1998   BILATERAL    PH IMPEDANCE STUDY  06/21/2018   Procedure: PH IMPEDANCE STUDY;  Surgeon: Shellia Cleverly, DO;  Location: [facility];  Service: Gastroenterology;;   POLYPECTOMY  06/10/2020   Procedure: POLYPECTOMY;  Surgeon: Shellia Cleverly, DO;  [facility]: [facility];  Service: Gastroenterology;;   SKIN CANCER EXCISION  2016   RT SIDE OF NOSE   SUBMUCOSAL TATTOO INJECTION  06/10/2020   Procedure: SUBMUCOSAL TATTOO INJECTION;  Surgeon: Shellia Cleverly, DO;  [facility];  Service: Gastroenterology;;   TONSILLECTOMY  1953   TOTAL HIP ARTHROPLASTY  2010   RIGHT   TOTAL HIP ARTHROPLASTY Left 05/09/2015   Procedure: LEFT TOTAL HIP  ARTHROPLASTY ANTERIOR APPROACH;  Surgeon: Ollen Gross, MD;  Location: [facility];  Service: Orthopedics;  Laterality: Left;   TOTAL KNEE ARTHROPLASTY  2001   TUMOR REMOVAL  2012   ABDOMINAL - NON CANCEROUS   Past Surgical History:  Procedure Laterality Date   22 HOUR PH STUDY N/A 06/21/2018   Procedure: 24 HOUR PH STUDY;  Surgeon: Shellia Cleverly, DO;  [facility];  Service: Gastroenterology;  Laterality: N/A;  with impedance   ABDOMINAL HYSTERECTOMY  2010   ANAL RECTAL MANOMETRY N/A 09/19/2019   Procedure: ANO RECTAL MANOMETRY;  Surgeon: Napoleon Form, MD;  [facility];  Service: Endoscopy;  Laterality: N/A;   APPENDECTOMY  1949   BACK SURGERY  1973   BIOPSY  06/10/2020   Procedure: BIOPSY;  Surgeon: Shellia Cleverly, DO;  [facility];  Service: Gastroenterology;;  EGD and COLON   BREAST SURGERY     CATARACT EXTRACTION Bilateral 2011   CHOLECYSTECTOMY     COLON RESECTION N/A 06/12/2020   Procedure: LAPAROSCOPIC COLON RESECTION;  Surgeon: Abigail Miyamoto, MD;  [facility];  Service: General;  Laterality: N/A;   COLONOSCOPY WITH PROPOFOL N/A 06/10/2020   Procedure: COLONOSCOPY WITH PROPOFOL;  Surgeon: Shellia Cleverly, DO;  [facility];  Service: Gastroenterology;  Laterality: N/A;   ESOPHAGEAL MANOMETRY N/A 06/21/2018   Procedure: ESOPHAGEAL MANOMETRY (EM);  Surgeon: Shellia Cleverly, DO;  [facility];  Service: Gastroenterology;  Laterality: N/A;   ESOPHAGOGASTRODUODENOSCOPY (EGD) WITH PROPOFOL N/A 06/10/2020   Procedure: ESOPHAGOGASTRODUODENOSCOPY (EGD) WITH PROPOFOL;  Surgeon: Shellia Cleverly, DO;  [facility];  Service: Gastroenterology;  Laterality: N/A;   GALLBLADDER SURGERY  2015   INCISIONAL HERNIA REPAIR N/A 09/16/2021   Procedure: OPEN INCISIONAL HERNIA REPAIR WITH MESH;  Surgeon: Abigail Miyamoto, MD;  [facility];  Service: General;  Laterality: N/A;   JOINT REPLACEMENT     RT TOTAL HIP /  RT TOTAL KNEE   MASTECTOMY  1998   BILATERAL    PH IMPEDANCE STUDY  06/21/2018   Procedure: PH IMPEDANCE STUDY;  Surgeon: Shellia Cleverly, DO;  [facility];  Service: Gastroenterology;;   POLYPECTOMY  06/10/2020   Procedure: POLYPECTOMY;  Surgeon: Shellia Cleverly, DO;  [facility];  Service: Gastroenterology;;   SKIN CANCER EXCISION  2016   RT SIDE OF NOSE   SUBMUCOSAL TATTOO INJECTION  06/10/2020   Procedure: SUBMUCOSAL TATTOO INJECTION;  Surgeon: Shellia Cleverly, DO;  [facility];  Service: Gastroenterology;;   TONSILLECTOMY  1953   TOTAL HIP ARTHROPLASTY  2010   RIGHT   TOTAL HIP ARTHROPLASTY Left 05/09/2015   Procedure: LEFT TOTAL HIP ARTHROPLASTY ANTERIOR APPROACH;  Surgeon: Ollen Gross, MD;  [facility];  Service: Orthopedics;  Laterality: Left;   TOTAL KNEE ARTHROPLASTY  2001   TUMOR REMOVAL  2012   ABDOMINAL - NON CANCEROUS   Past Medical History:  Diagnosis Date   Arthritis    Cancer (HCC)    HX BREAST CANCER/ SKIN CANCER   Colon cancer (HCC)    Complication of anesthesia    N/V WITH MORPHINE   Difficulty sleeping    Fractured hip (HCC)    LEFT - AUG 2016   GERD (gastroesophageal reflux disease)    Hyperlipidemia    Hypertension    Melanoma (HCC)    Neuropathy    Nocturia    Osteopenia    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  Opioid Risk Score:   Fall Risk Score:  `1  Depression screen [facility] 2/9     04/11/2023   11:04 AM 02/14/2023   11:05 AM 01/14/2023   11:07 AM 12/20/2022   11:43 AM 11/15/2022   11:26 AM 10/21/2022   11:16 AM 09/06/2022    3:07 PM  Depression screen PH[facility] 2/9  Decreased Interest 0 0 0 0 0 0 0  Down, Depressed, Hopeless 0 0 0 0 0 0 0  PHQ -  2 Score 0 0 0 0 0 0 0     Review of  Systems  Musculoskeletal:  Positive for back pain and gait problem.       Bilateral leg pain  All other systems reviewed and are negative.     Objective:   Physical Exam Vitals and nursing note reviewed.  Constitutional:      Appearance: Normal appearance.  Cardiovascular:     Rate and Rhythm: Normal rate and regular rhythm.     Pulses: Normal pulses.     Heart sounds: Normal heart sounds.  Pulmonary:     Effort: Pulmonary effort is normal.     Breath sounds: Normal breath sounds.  Musculoskeletal:     Comments: Normal Muscle Bulk and Muscle Testing Reveals:  Upper Extremities: Full ROM and Muscle Strength 5/5 Lumbar Paraspinal Tenderness: L-3-L-5 Lower Extremities: Right: Full ROM and Muscle Strength 5/5 Left Lower Extremity: Decreased ROM and Muscle Strength 5/5 Left Lower Flexion Produces Pain into her Left Patella Arises from Chair slowly using walker for support Narrow Based  Gait     Skin:    General: Skin is warm and dry.  Neurological:     Mental Status: She is alert and oriented to person, place, and time.  Psychiatric:        Mood and Affect: Mood normal.        Behavior: Behavior normal.         Assessment & Plan:  1. Chronic Bilateral Leg pain: Paresthesia: Continue to Monitor. 05/10/2023 2. Paresthesia Salli Real Radiculitis: Ms. Yarish has weaned herself off the Lyrica due to daytime drowsiness. We will continue to monitor. 05/10/2023 3. Pain of Left Wrist/ : No complaints today. S/P Carpal Tunnel Release on 06/03/2017 by Dr. Danielle Dess. Dr. Danielle Dess Following. 05/10/2023. 4. Fracture of superior pubic ramus.  Dr. Lequita Halt Following.  Continue to monitor. 05/10/2023. 5. Bilateral Knee OA: Continue Voltaren Gel.  Continue to monitor. Orthopedist following. 05/10/2023 6. Polyarthralgia: Continue to alternate with heat and ice therapy. Continue current medication regime. Continue to monitor. 05/10/2023. 7. Chronic Pain Syndrome: Refilled::Hydrocodone  10/325 one tablet  4 times a day as needed for pain #120. We will continue the opioid monitoring program, this consists of regular clinic visits, examinations, urine drug screen, pill counts as well as use of [place] Controlled Substance Reporting system. A 12 month History has been reviewed on the [place] Controlled Substance Reporting System on 05/10/2023. 8. Lumbar Compression Fracture L2 and L3:  Ms. Zorger refused physical therapy. Continue with rest/ heat therapy. Continue to Monitor 05/10/2023. 9. Muscle Spasm: Continue  Flexeril 5 mg at HS. Continue to monitor. 05/10/2023 10. Right Shoulder Pain: No complaints today. Continue HEP as Tolerated. Alternate Ice and Heat Therapy. 05/10/2023  11. Left Greater Trochanter Bursitis:  Continue to Monitor. Continue HEP as Tolerated. 05/10/2023     F/U in 1 Month

## 2023-05-12 ENCOUNTER — Ambulatory Visit: Payer: Medicare Other | Admitting: Obstetrics and Gynecology

## 2023-05-12 ENCOUNTER — Encounter: Payer: Self-pay | Admitting: Obstetrics and Gynecology

## 2023-05-12 VITALS — BP 122/70 | HR 79

## 2023-05-12 DIAGNOSIS — N952 Postmenopausal atrophic vaginitis: Secondary | ICD-10-CM

## 2023-05-12 DIAGNOSIS — R350 Frequency of micturition: Secondary | ICD-10-CM

## 2023-05-12 DIAGNOSIS — R351 Nocturia: Secondary | ICD-10-CM | POA: Diagnosis not present

## 2023-05-12 MED ORDER — METHENAMINE HIPPURATE 1 G PO TABS: 1.0000 g | ORAL_TABLET | Freq: Two times a day (BID) | ORAL | 2 refills | Status: DC

## 2023-05-12 NOTE — Progress Notes (Signed)
[facility] Return Visit  SUBJECTIVE  History of Present Illness: Crystal Lee is a 89 y.o. female seen in follow-up for OAB and nocturia as well as UTI symptoms with negative cultures.    Patient reports she was hospitalized in July with COVID and was told she also had a UTI at that time. She has since had pressure and frequent urination with negative cultures. She has continued to do the estrogen cream twice a week but reports the symptoms remain and she uses AZO as needed.  Patient is still getting up 5-6 times at night with her Myrbetriq 50 mg.    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   Past Surgical History: She  has a past surgical history that includes Breast surgery; Cholecystectomy; Total hip arthroplasty (2010); Mastectomy (1998); Joint replacement; Total knee arthroplasty (2001); Back surgery (1973); Skin cancer excision (2016); Appendectomy (1949); Tumor removal (2012); Total hip arthroplasty (Left, 05/09/2015); Abdominal hysterectomy (2010); Gallbladder surgery (2015); Tonsillectomy (1953); Cataract extraction (Bilateral, 2011); Esophageal manometry (N/A, 06/21/2018); 24 hour ph study (N/A, 06/21/2018); PH impedance study (06/21/2018); Anal Rectal manometry (N/A, 09/19/2019); Colonoscopy with propofol (N/A, 06/10/2020); Esophagogastroduodenoscopy (egd) with propofol (N/A, 06/10/2020); biopsy (06/10/2020); Submucosal tattoo injection (06/10/2020); polypectomy (06/10/2020); Colon resection (N/A, 06/12/2020); and Incisional hernia repair (N/A, 09/16/2021).    Medications: She has a current medication list which includes the following prescription(s): aspirin ec, atorvastatin, bacitracin, vitamin d3, conjugated estrogens, cyclobenzaprine, ergocalciferol, estradiol, hydrocodone-acetaminophen, ipratropium, lidocaine, losartan, memantine, methenamine, mirabegron er, multivitamin adults, neomycin-polymyxin b-dexamethasone, promethazine-dextromethorphan, valacyclovir, cyanocobalamin, vitamin d (ergocalciferol), and albuterol.   Allergies: Patient is allergic to morphine, propoxyphene, donepezil, morphine and codeine, and tape.   Social History: Patient  reports that she quit smoking about 47 years ago. Her smoking use included cigarettes. She started smoking about 67 years ago. She has a 10 pack-year smoking history. She has never used smokeless tobacco. She reports that she does not drink alcohol and does not use drugs.      OBJECTIVE     Physical Exam: Vitals:   05/12/23 1318  BP: 122/70  Pulse: 79   Gen: No apparent distress, A&O x 3.  Detailed Urogynecologic Evaluation:  Deferred.    ASSESSMENT AND PLAN    [name] is a 89 y.o. with:  1. Nocturia   2. Urinary frequency   3. Vaginal atrophy     Discussed with patient that her nocturia symptoms are not well-managed at this time and with her being on the 50 mg of Myrbetriq the goal would be for her to be getting up 2 times or less at night.  Patient is not getting good sleep and is not resting appropriately due to the number of times she is getting up to urinate.  Would suggest that patient switches to Gemtesa 75 mg daily and we will see if this is a good alternative for her and helps her sleep through the night.  If the medication is not helpful for her nocturia and urinary frequency we can discuss tertiary therapy options including bladder Botox or percutaneous tibial nerve stimulation. We also discussed that her feelings of urgency frequency and mild burning could be related to  bladder irritation and inflammation they  are not associated with a bacteria.  Patient seemed open to this and we agreed to get her to restart methenamine for her 1 g daily as an anti-infective.  She will continue to take Pyridium as needed for burning and irritation. Discussed with patient that she should be using the vaginal estrogen twice a week which she reports that she does.  Will plan for patient to return in 4 weeks for medication follow-up on her changed over to [place].  Samples given today to patient.  Selmer Dominion, NP

## 2023-05-12 NOTE — Patient Instructions (Addendum)
Please stop the Myrbetriq and start Gemtesa 75mg  daily  Start Methenamine daily for prevention of bladder irritation.   Continue to do the estrogen cream twice a week.

## 2023-05-23 ENCOUNTER — Other Ambulatory Visit: Payer: Self-pay

## 2023-05-23 ENCOUNTER — Emergency Department (HOSPITAL_BASED_OUTPATIENT_CLINIC_OR_DEPARTMENT_OTHER): Payer: Medicare Other

## 2023-05-23 ENCOUNTER — Emergency Department (HOSPITAL_BASED_OUTPATIENT_CLINIC_OR_DEPARTMENT_OTHER)
Admission: EM | Admit: 2023-05-23 | Discharge: 2023-05-23 | Disposition: A | Discharge status: Home / Self Care | Payer: Medicare Other | Attending: Emergency Medicine | Admitting: Emergency Medicine

## 2023-05-23 DIAGNOSIS — I10 Essential (primary) hypertension: Secondary | ICD-10-CM | POA: Insufficient documentation

## 2023-05-23 DIAGNOSIS — K219 Gastro-esophageal reflux disease without esophagitis: Secondary | ICD-10-CM | POA: Insufficient documentation

## 2023-05-23 DIAGNOSIS — R1084 Generalized abdominal pain: Secondary | ICD-10-CM | POA: Insufficient documentation

## 2023-05-23 DIAGNOSIS — Z85038 Personal history of other malignant neoplasm of large intestine: Secondary | ICD-10-CM | POA: Insufficient documentation

## 2023-05-23 DIAGNOSIS — Z853 Personal history of malignant neoplasm of breast: Secondary | ICD-10-CM | POA: Diagnosis not present

## 2023-05-23 DIAGNOSIS — N3001 Acute cystitis with hematuria: Secondary | ICD-10-CM | POA: Diagnosis not present

## 2023-05-23 DIAGNOSIS — R059 Cough, unspecified: Secondary | ICD-10-CM | POA: Diagnosis not present

## 2023-05-23 DIAGNOSIS — Z79899 Other long term (current) drug therapy: Secondary | ICD-10-CM | POA: Insufficient documentation

## 2023-05-23 DIAGNOSIS — Z7982 Long term (current) use of aspirin: Secondary | ICD-10-CM | POA: Insufficient documentation

## 2023-05-23 DIAGNOSIS — Z20822 Contact with and (suspected) exposure to covid-19: Secondary | ICD-10-CM | POA: Insufficient documentation

## 2023-05-23 LAB — COMPREHENSIVE METABOLIC PANEL
ALT: 21 U/L (ref 0–44)
AST: 30 U/L (ref 15–41)
Albumin: 4 g/dL (ref 3.5–5.0)
Alkaline Phosphatase: 98 U/L (ref 38–126)
Anion gap: 9 (ref 5–15)
BUN: 10 mg/dL (ref 8–23)
CO2: 24 mmol/L (ref 22–32)
Calcium: 9.4 mg/dL (ref 8.9–10.3)
Chloride: 104 mmol/L (ref 98–111)
Creatinine, Ser: 0.61 mg/dL (ref 0.44–1.00)
GFR, Estimated: >60 mL/min (ref >60)
Glucose, Bld: 111 mg/dL — ABNORMAL HIGH (ref 70–99)
Potassium: 4.2 mmol/L (ref 3.5–5.1)
Sodium: 137 mmol/L (ref 135–145)
Total Bilirubin: 1.1 mg/dL (ref <1.2)
Total Protein: 7.8 g/dL (ref 6.5–8.1)

## 2023-05-23 LAB — URINALYSIS, ROUTINE W REFLEX MICROSCOPIC
Bilirubin Urine: NEGATIVE
Glucose, UA: NEGATIVE mg/dL
Ketones, ur: NEGATIVE mg/dL
Nitrite: POSITIVE — AB
Protein, ur: 100 mg/dL — AB
Specific Gravity, Urine: 1.02 (ref 1.005–1.030)
pH: 7 (ref 5.0–8.0)

## 2023-05-23 LAB — CBC WITH DIFFERENTIAL/PLATELET
Abs Immature Granulocytes: 0.01 10*3/uL (ref 0.00–0.07)
Basophils Absolute: 0 10*3/uL (ref 0.0–0.1)
Basophils Relative: 0 %
Eosinophils Absolute: 0.1 10*3/uL (ref 0.0–0.5)
Eosinophils Relative: 1 %
HCT: 42 % (ref 36.0–46.0)
Hemoglobin: 13.8 g/dL (ref 12.0–15.0)
Immature Granulocytes: 0 %
Lymphocytes Relative: 24 %
Lymphs Abs: 2.2 10*3/uL (ref 0.7–4.0)
MCH: 29.2 pg (ref 26.0–34.0)
MCHC: 32.9 g/dL (ref 30.0–36.0)
MCV: 89 fL (ref 80.0–100.0)
Monocytes Absolute: 0.5 10*3/uL (ref 0.1–1.0)
Monocytes Relative: 6 %
Neutro Abs: 6.3 10*3/uL (ref 1.7–7.7)
Neutrophils Relative %: 69 %
Platelets: 138 10*3/uL — ABNORMAL LOW (ref 150–400)
RBC: 4.72 MIL/uL (ref 3.87–5.11)
RDW: 13.1 % (ref 11.5–15.5)
WBC: 9.1 10*3/uL (ref 4.0–10.5)
nRBC: 0 % (ref 0.0–0.2)

## 2023-05-23 LAB — URINALYSIS, MICROSCOPIC (REFLEX): WBC, UA: >50 WBC/hpf (ref 0–5)

## 2023-05-23 LAB — RESP PANEL BY RT-PCR (RSV, FLU A&B, COVID)  RVPGX2
Influenza A by PCR: NEGATIVE
Influenza B by PCR: NEGATIVE
Resp Syncytial Virus by PCR: NEGATIVE
SARS Coronavirus 2 by RT PCR: NEGATIVE

## 2023-05-23 LAB — GROUP A STREP BY PCR: Group A Strep by PCR: NOT DETECTED

## 2023-05-23 LAB — LIPASE, BLOOD: Lipase: 37 U/L (ref 11–51)

## 2023-05-23 MED ORDER — CEPHALEXIN 500 MG PO CAPS: 500.0000 mg | ORAL_CAPSULE | Freq: Four times a day (QID) | ORAL | 0 refills | Status: DC

## 2023-05-23 MED ORDER — CEPHALEXIN 250 MG PO CAPS
500.0000 mg | ORAL_CAPSULE | Freq: Once | ORAL | Status: AC
  Administered 2023-05-23: 500 mg via ORAL
  Filled 2023-05-23: qty 2

## 2023-05-23 NOTE — ED Triage Notes (Signed)
Pt reports that she has had a cough, sore throat and abdominal pain x 1 month. States that she also has a headache.

## 2023-05-23 NOTE — Discharge Instructions (Addendum)
Please follow-up with your primary care provider regards recent ER visit.  Today your labs and imaging do show that you have a UTI which may be causing your symptoms.  You are given 1 dose of antibiotics here but will need pick up the rest.  Please take Tylenol every 6 hours needed for pain remain hydrated and eat food as tolerated.  If symptoms change or worsen please return to the ER.

## 2023-05-23 NOTE — ED Notes (Signed)
Pt attempted to obtain urine specimen, unable to, just has sensation to pee but cannot produce urine.

## 2023-05-23 NOTE — ED Provider Notes (Signed)
[place] EMERGENCY DEPARTMENT AT [facility] [place] Provider Note   CSN: [id number] Arrival date & time: 05/23/23  0907     History  No chief complaint on file.   Zaily Writt is a 89 y.o. 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  Patient states that she is nauseous when she looks at food and does endorse decreased appetite.  Patient denies fevers.  Patient dates that she thinks she has had a cold but is unsure of sick contacts.  Patient denies chest pain or shortness of breath or wheezing.  Patient states she is coughing up yellow sputum.  Patient denies dysuria/hematuria, constipation with diarrhea  Home Medications Prior to Admission medications   Medication Sig Start Date End Date Taking? Authorizing Provider  albuterol (VENTOLIN HFA) 108 (90 Base) MCG/ACT inhaler Inhale 1-2 puffs into the lungs every 6 (six) hours as needed for up to 5 days for wheezing or shortness of breath. 01/14/23 01/19/23  Tonette Lederer, PA-C  aspirin EC 81 MG tablet Take 81 mg by mouth daily. Swallow whole.    [provider]  atorvastatin (LIPITOR) 20 MG tablet Take 1 tablet (20 mg total) by mouth daily. 09/06/22   Copland, Gwenlyn Found, MD  bacitracin ophthalmic ointment Apply to eyelids at bedtime as needed. 09/06/22   Copland, Gwenlyn Found, MD  Cholecalciferol (VITAMIN D3) 1.25 MG (50000 UT) CAPS Take 1 weekly 11/03/21   Copland, Gwenlyn Found, MD  conjugated estrogens (PREMARIN) vaginal cream Place 1 Applicatorful vaginally 2 (two) times a week. Place 0.5g nightly twice a week 08/09/22   Marguerita Beards, MD  cyclobenzaprine (FLEXERIL) 5 MG tablet Take 1 tablet (5 mg total) by mouth at bedtime as needed for muscle spasms. 09/07/22   Jones Bales, NP  Ergocalciferol (VITAMIN D2 PO)  10/21/21   [provider]  estradiol (ESTRACE) 0.1 MG/GM vaginal  cream Place 0.5 g vaginally 2 (two) times a week as directed 06/03/22   Marguerita Beards, MD  HYDROcodone-acetaminophen (NORCO) 10-325 MG tablet Take 1 tablet by mouth every 6 (six) hours as needed. 05/10/23   Jones Bales, NP  ipratropium (ATROVENT) 0.06 % nasal spray Place 2 sprays into both nostrils 3 (three) times daily. 06/17/22     lidocaine 4 % Place 1-3 patches onto the skin daily. Use for up to 12 hours in a 24 hour period 01/19/23   Copland, Gwenlyn Found, MD  losartan (COZAAR) 25 MG tablet Take 1 tablet (25 mg total) by mouth daily. 09/06/22   Copland, Gwenlyn Found, MD  memantine (NAMENDA) 5 MG tablet Take 1 tablet (5 mg total) by mouth 2 (two) times daily. 01/24/23   Marcos Eke, PA-C  methenamine (HIPREX) 1 g tablet Take 1 tablet (1 g total) by mouth 2 (two) times daily with a meal. 05/12/23   Zuleta, Joan Mayans, NP  mirabegron ER (MYRBETRIQ) 50 MG TB24 tablet Take 1 tablet (50 mg total) by mouth daily. 08/09/22   Marguerita Beards, MD  Multiple Vitamins-Minerals (MULTIVITAMIN ADULTS) TABS Take 1 tablet by mouth daily.    [provider]  neomycin-polymyxin b-dexamethasone (MAXITROL) 3.5-10000-0.1 SUSP  09/06/22   [provider]  promethazine-dextromethorphan (PROMETHAZINE-DM) 6.25-15 MG/5ML syrup Take 5 mLs by mouth 4 (four) times daily as needed. 12/23/22   Clayborne Dana, NP  valACYclovir (VALTREX) 1000 MG tablet Take 1 tablet (1,000 mg  total) by mouth 2 (two) times daily. 02/24/22     vitamin B-12 (CYANOCOBALAMIN) 1000 MCG tablet Take 1,000 mcg by mouth daily.    [provider]  Vitamin D, Ergocalciferol, (DRISDOL) 1.25 MG (50000 UNIT) CAPS capsule Take 50,000 Units by mouth once a week. 10/15/22   [provider]      Allergies    Morphine, Propoxyphene, Donepezil, Morphine and codeine, and Tape    Review of Systems   Review of Systems  Physical Exam Updated Vital Signs There were no vitals taken for this visit. Physical Exam Vitals  reviewed.  Constitutional:      General: She is not in acute distress. HENT:     Head: Normocephalic and atraumatic.  Eyes:     Extraocular Movements: Extraocular movements intact.     Conjunctiva/sclera: Conjunctivae normal.     Pupils: Pupils are equal, round, and reactive to light.  Cardiovascular:     Rate and Rhythm: Normal rate and regular rhythm.     Pulses: Normal pulses.     Heart sounds: Normal heart sounds.     Comments: 2+ bilateral radial/dorsalis pedis pulses with regular rate Pulmonary:     Effort: Pulmonary effort is normal. No respiratory distress.     Breath sounds: Normal breath sounds.  Abdominal:     Palpations: Abdomen is soft.     Tenderness: There is abdominal tenderness (Generalized). There is no guarding or rebound.  Musculoskeletal:        General: Normal range of motion.     Cervical back: Normal range of motion and neck supple.     Comments: 5 out of 5 bilateral grip/leg extension strength  Skin:    General: Skin is warm and dry.     Capillary Refill: Capillary refill takes less than 2 seconds.  Neurological:     General: No focal deficit present.     Mental Status: She is alert and oriented to person, place, and time.     Comments: Sensation intact in all 4 limbs  Psychiatric:        Mood and Affect: Mood normal.     ED Results / Procedures / Treatments   Labs (all labs ordered are listed, but only abnormal results are displayed) Labs Reviewed - No data to display  EKG None  Radiology No results found.  Procedures Procedures    Medications Ordered in ED Medications - No data to display  ED Course/ Medical Decision Making/ A&P                                 Medical Decision Making Amount and/or Complexity of Data Reviewed Labs: ordered. Radiology: ordered.  Risk Prescription drug management.   Helmut Muster 89 y.o. presented today for abdominal pain.  Working DDx that I considered at this time includes, but not limited  to, gastroenteritis, colitis, small bowel obstruction, appendicitis, cholecystitis, hepatobiliary pathology, gastritis, PUD, ACS, aortic dissection, diverticulosis/diverticulitis, pancreatitis, nephrolithiasis, medication induced, AAA, UTI, pyelonephritis, ruptured ectopic pregnancy, PID, ovarian torsion.  R/o DDx: gastroenteritis, colitis, small bowel obstruction, appendicitis, cholecystitis, hepatobiliary pathology, gastritis, PUD, ACS, aortic dissection, diverticulosis/diverticulitis, pancreatitis, nephrolithiasis, medication induced, AAA, ruptured ectopic pregnancy, PID, ovarian torsion: These are considered less likely due to history of present illness, physical exam, labs/imaging findings.  Review of prior external notes: 01/14/2023 ED  Unique Tests and My Interpretation:  CBC with differential: Unremarkable CMP: Unremarkable Lipase: Unremarkable Group A strep: Not  detected Respiratory panel: Negative CXR: Unremarkable UA: Nitrate positive  Social Determinants of Health: none  Discussion with Independent Historian: None  Discussion of Management of Tests: None  Risk: Low: based on diagnostic testing/clinical impression and treatment plan  Risk Stratification Score: None  Plan: On exam patient was in no acute distress stable vitals.  Physical exam does show generalized abdominal tenderness without peritoneal signs.  Patient that she is coughing up yellow sputum so we will get chest x-ray to rule out pneumonia as patient states that she think she has a cold.  Patient's labs are reassuring.  Still waiting the chest x-ray and p.o. challenge and if negative patient like to be discharged.  I offered a CT scan of the belly as patient does have significant abdominal history and did have tenderness on exam however patient denied wanting one at this time feels that this is just a cold.  Patient does know this will limit the evaluation lead diagnoses patient with full decision made capacity  verbalized understanding acceptance of this.  Encourage patient use Mucinex along with Tylenol every 6 hours and fluids and follow-up with primary care provider.  Chest x-ray negative.  UA does show nitrate positive urine and since patient is weak we will treat and sent for culture.  Patient given 1 dose Keflex here and prescribed the rest.  Patient does not have flank pain so do not suspect pyelonephritis.  Patient still does not want a CT scan of her abdomen and was given strict return precautions.  I discussed the plan above.  Patient was given return precautions. Patient stable for discharge at this time.  Patient verbalized understanding of plan.  This chart was dictated using voice recognition software.  Despite best efforts to proofread,  errors can occur which can change the documentation meaning.         Final Clinical Impression(s) / ED Diagnoses Final diagnoses:  None    Rx / DC Orders ED Discharge Orders     None         Netta Corrigan, PA-C 05/23/23 1353    Elayne Snare K, DO 05/23/23 1423

## 2023-05-23 NOTE — ED Notes (Signed)
ED Provider at bedside. 

## 2023-05-23 NOTE — ED Notes (Signed)
Discharge paperwork reviewed entirely with patient, including follow up care. Pain was under control. No prescriptions were called in, but all questions were addressed.  Pt verbalized understanding as well as all parties involved. No questions or concerns voiced at the time of discharge. No acute distress noted.   Pt ambulated out to PVA without incident or assistance. Used a Secretary/administrator without incident or Geophysicist/field seismologist.   Pt advised they will notify their PCP immediately.

## 2023-05-24 ENCOUNTER — Telehealth: Payer: Self-pay | Admitting: *Deleted

## 2023-05-24 NOTE — Transitions of Care (Post Inpatient/ED Visit) (Signed)
   05/24/2023  Name: Laneta Wiginton MRN: [id number] DOB: April 16, 1934  Today's TOC FU Call Status: Today's TOC FU Call Status:: Unsuccessful Call (1st Attempt) Unsuccessful Call (1st Attempt) Date: 05/24/23  Attempted to reach the patient regarding the most recent Inpatient/ED visit.  Follow Up Plan: Additional outreach attempts will be made to reach the patient to complete the Transitions of Care (Post Inpatient/ED visit) call.   Signature Jerra Huckeby, [facility]

## 2023-05-25 LAB — URINE CULTURE: Culture: 60000 — AB

## 2023-05-26 ENCOUNTER — Telehealth (HOSPITAL_BASED_OUTPATIENT_CLINIC_OR_DEPARTMENT_OTHER): Payer: Self-pay

## 2023-05-26 ENCOUNTER — Telehealth: Payer: Self-pay

## 2023-05-26 DIAGNOSIS — R300 Dysuria: Secondary | ICD-10-CM

## 2023-05-26 NOTE — Telephone Encounter (Signed)
Copied from CRM [id number]. Topic: Clinical - Lab/Test Results >> May 26, 2023  8:29 AM Fredrich Romans wrote: Reason for CRM: Patient was seen in ED on 12/23 and placed Keflex for an UTI.She called in today wondering of Dr Patsy Lager has had the chance to look over her lab results. She stated that the antibiotic is not helping.She is still experiencing pain while urinating. She is requesting a phone call as to what she should do.She has not completed round of antibiotics yet.

## 2023-05-26 NOTE — Telephone Encounter (Signed)
Post ED Visit - Positive Culture Follow-up: Successful Patient Follow-Up  Culture assessed and recommendations reviewed by:  []  Enzo Bi, Pharm.D. []  Celedonio Miyamoto, Pharm.D., BCPS AQ-ID [x]  Estill Batten, Pharm.D., BCCCP []  Georgina Pillion, [address].D., BCPS []  [place], [address].D., BCPS, AAHIVP []  Estella Husk, Pharm.D., BCPS, AAHIVP []  Lysle Pearl, PharmD, BCPS []  Phillips Climes, PharmD, BCPS []  Agapito Games, PharmD, BCPS []  Verlan Friends, PharmD  Positive urine culture  []  Patient discharged without antimicrobial prescription and treatment is now indicated [x]  Organism is resistant to prescribed ED discharge antimicrobial []  Patient with positive blood cultures  Plan - call pt for s/s check in no symptoms then no changes. It having symptoms stop keflex and start Cipro. Called pt, pt states she is still having a lot of symptoms.   Changes discussed with ED provider: Margarita Grizzle, MD New antibiotic prescription Cipro 250 mg po BID x 3 days Called to [facility] in [place]  Contacted patient, date 05/26/23, time 1:30 pm   Sandria Senter 05/26/2023, 2:10 PM

## 2023-05-26 NOTE — Telephone Encounter (Signed)
Called pt- she is concerned that her UTI is still active.  We made an appt for her to drop off a sample tomorrow.  If sx continue after that we will need to see her

## 2023-05-26 NOTE — Telephone Encounter (Signed)
Please see below.

## 2023-05-26 NOTE — Progress Notes (Signed)
ED Antimicrobial Stewardship Positive Culture Follow Up   Clydine Mchatton is an 89 y.o. female who presented to [facility] with a chief complaint of  Chief Complaint  Patient presents with   Abdominal Pain    Recent Results (from the past 720 hours)  Resp panel by RT-PCR (RSV, Flu A&B, Covid) Anterior Nasal Swab     Status: None   Collection Time: 05/23/23  9:34 AM   Specimen: Anterior Nasal Swab  Result Value Ref Range Status   SARS Coronavirus 2 by RT PCR NEGATIVE NEGATIVE Final    Comment: (NOTE) SARS-CoV-2 target nucleic acids are NOT DETECTED.  The SARS-CoV-2 RNA is generally detectable in upper respiratory specimens during the acute phase of infection. The lowest concentration of SARS-CoV-2 viral copies this assay can detect is 138 copies/mL. A negative result does not preclude SARS-Cov-2 infection and should not be used as the sole basis for treatment or other patient management decisions. A negative result may occur with  improper specimen collection/handling, submission of specimen other than nasopharyngeal swab, presence of viral mutation(s) within the areas targeted by this assay, and inadequate number of viral copies(<138 copies/mL). A negative result must be combined with clinical observations, patient history, and epidemiological information. The expected result is Negative.  Fact Sheet for Patients:  BloggerCourse.com  Fact Sheet for Healthcare Providers:  SeriousBroker.it  This test is no t yet approved or cleared by the [place] FDA and  has been authorized for detection and/or diagnosis of SARS-CoV-2 by FDA under an Emergency Use Authorization (EUA). This EUA will remain  in effect (meaning this test can be used) for the duration of the COVID-19 declaration under Section 564(b)(1) of the Act, 21 U.S.C.section 360bbb-3(b)(1), unless the authorization is terminated  or revoked sooner.       Influenza A  by PCR NEGATIVE NEGATIVE Final   Influenza B by PCR NEGATIVE NEGATIVE Final    Comment: (NOTE) The Xpert Xpress SARS-CoV-2/FLU/RSV plus assay is intended as an aid in the diagnosis of influenza from Nasopharyngeal swab specimens and should not be used as a sole basis for treatment. Nasal washings and aspirates are unacceptable for Xpert Xpress SARS-CoV-2/FLU/RSV testing.  Fact Sheet for Patients: BloggerCourse.com  Fact Sheet for Healthcare Providers: SeriousBroker.it  This test is not yet approved or cleared by the [place] FDA and has been authorized for detection and/or diagnosis of SARS-CoV-2 by FDA under an Emergency Use Authorization (EUA). This EUA will remain in effect (meaning this test can be used) for the duration of the COVID-19 declaration under Section 564(b)(1) of the Act, 21 U.S.C. section 360bbb-3(b)(1), unless the authorization is terminated or revoked.     Resp Syncytial Virus by PCR NEGATIVE NEGATIVE Final    Comment: (NOTE) Fact Sheet for Patients: BloggerCourse.com  Fact Sheet for Healthcare Providers: SeriousBroker.it  This test is not yet approved or cleared by the [place] FDA and has been authorized for detection and/or diagnosis of SARS-CoV-2 by FDA under an Emergency Use Authorization (EUA). This EUA will remain in effect (meaning this test can be used) for the duration of the COVID-19 declaration under Section 564(b)(1) of the Act, 21 U.S.C. section 360bbb-3(b)(1), unless the authorization is terminated or revoked.  Performed at [facility], [address] [place], [place] [address]   Group A Strep by PCR     Status: None   Collection Time: 05/23/23 10:55 AM   Specimen: Throat; Sterile Swab  Result Value Ref Range Status  Group A Strep by PCR NOT DETECTED NOT DETECTED Final    Comment: Performed at [facility],  [address] [place], [place] [address]  Urine Culture     Status: Abnormal   Collection Time: 05/23/23  1:53 PM   Specimen: Urine, Clean Catch  Result Value Ref Range Status   Specimen Description   Final    URINE, CLEAN CATCH Performed at [facility], [address] [place], [place] [address]    Special Requests   Final    NONE Performed at [facility], [address] [place], [place] [address]    Culture 60,000 COLONIES/mL PSEUDOMONAS AERUGINOSA (A)  Final   Report Status 05/25/2023 FINAL  Final   Organism ID, Bacteria PSEUDOMONAS AERUGINOSA (A)  Final      Susceptibility   Pseudomonas aeruginosa - MIC*    CEFTAZIDIME 4 SENSITIVE Sensitive     CIPROFLOXACIN 0.5 SENSITIVE Sensitive     GENTAMICIN <=1 SENSITIVE Sensitive     IMIPENEM 1 SENSITIVE Sensitive     PIP/TAZO 8 SENSITIVE Sensitive ug/mL    CEFEPIME 2 SENSITIVE Sensitive     * 60,000 COLONIES/mL PSEUDOMONAS AERUGINOSA    Treated with Keflex, organism resistant to prescribed antimicrobial  New antibiotic prescription: Cipro 250mg  q12h x 3d.   ED Provider: Margarita Grizzle, MD    Estill Batten, PharmD, BCCCP  05/26/2023, 10:03 AM Clinical Pharmacist Monday - Friday phone -  [phone] Saturday - Sunday phone - [phone]

## 2023-05-26 NOTE — Addendum Note (Signed)
Addended by: Abbe Amsterdam C on: 05/26/2023 04:31 PM   Modules accepted: Orders

## 2023-05-27 ENCOUNTER — Other Ambulatory Visit: Payer: Medicare Other

## 2023-05-27 DIAGNOSIS — R300 Dysuria: Secondary | ICD-10-CM | POA: Diagnosis not present

## 2023-05-30 ENCOUNTER — Encounter: Payer: Self-pay | Admitting: Family Medicine

## 2023-05-30 ENCOUNTER — Ambulatory Visit (INDEPENDENT_AMBULATORY_CARE_PROVIDER_SITE_OTHER): Payer: Medicare Other | Admitting: Family Medicine

## 2023-05-30 VITALS — BP 138/64 | HR 80 | Temp 98.2°F | Ht 66.0 in | Wt 205.0 lb

## 2023-05-30 DIAGNOSIS — R102 Pelvic and perineal pain: Secondary | ICD-10-CM | POA: Diagnosis not present

## 2023-05-30 DIAGNOSIS — R110 Nausea: Secondary | ICD-10-CM | POA: Diagnosis not present

## 2023-05-30 DIAGNOSIS — N3001 Acute cystitis with hematuria: Secondary | ICD-10-CM | POA: Diagnosis not present

## 2023-05-30 DIAGNOSIS — G8929 Other chronic pain: Secondary | ICD-10-CM

## 2023-05-30 LAB — POCT URINALYSIS DIPSTICK
Bilirubin, UA: NEGATIVE
Blood, UA: POSITIVE
Glucose, UA: NEGATIVE
Ketones, UA: NEGATIVE
Nitrite, UA: NEGATIVE
Protein, UA: NEGATIVE
Spec Grav, UA: 1.02 (ref 1.010–1.025)
Urobilinogen, UA: NEGATIVE U/dL — AB
pH, UA: 6 (ref 5.0–8.0)

## 2023-05-30 MED ORDER — CIPROFLOXACIN HCL 500 MG PO TABS: 500.0000 mg | ORAL_TABLET | Freq: Two times a day (BID) | ORAL | 0 refills | Status: AC

## 2023-05-30 MED ORDER — ONDANSETRON 4 MG PO TBDP
4.0000 mg | ORAL_TABLET | Freq: Three times a day (TID) | ORAL | 1 refills | Status: AC | PRN
Start: 2023-05-30 — End: ?

## 2023-05-30 NOTE — Progress Notes (Signed)
Assessment & Plan:  1. Acute cystitis with hematuria (Primary) Education provided on UTIs. Encouraged adequate hydration.  - ciprofloxacin (CIPRO) 500 MG tablet; Take 1 tablet (500 mg total) by mouth 2 (two) times daily for 7 days.  Dispense: 14 tablet; Refill: 0 - POCT urinalysis dipstick - Urine Culture; Future - Urine Culture  2. Chronic pelvic pain in female - MR PELVIS W WO CONTRAST; Future  3. Nausea - ondansetron (ZOFRAN-ODT) 4 MG disintegrating tablet; Take 1 tablet (4 mg total) by mouth every 8 (eight) hours as needed for nausea or vomiting.  Dispense: 30 tablet; Refill: 1   Follow up plan: Return if symptoms worsen or fail to improve.  Crystal Boston, MSN, APRN, FNP-C  Subjective:  HPI: Crystal Lee is a 89 y.o. female presenting on 05/30/2023 for Urinary Tract Infection (Pelvic pain to sit (has GI issues as well- hx colon cancer stage 3 - 3 years in [place]) sch'd for colon in FEB, dyuria, weak legs and back pain )  Patient is accompanied by her daughter, who she is okay with being present.  Patient is here due to pelvic pain she experiences when sitting down or upon standing, dysuria, leg weakness, and back pain.  She was seen in the emergency department on 05/23/2023 at which time she was diagnosed with a UTI and treated with Keflex.  She was then contacted on 05/26/2023 and advised to change her antibiotic to Cipro due to organism resistance.  She had a repeat urine culture on 05/27/2023 and the preliminary result continues to show Pseudomonas aeruginosa, but at lower CFU/mL been previously.  She states the pelvic pain started 6 months ago.  She has a history of stage III colon cancer and is scheduled for a repeat colonoscopy in February.  She is concerned that the pelvic pain has continued to worsen especially over the last 4 to 6 weeks.  She saw her urologist who prescribed methenamine, however she had no improvement in her symptoms and her acid reflux was worsened so she  stopped the medication.  She has very little appetite due to nausea.  She ate nothing yesterday and has only had toast this morning.  Denies any vomiting or diarrhea.   ROS: Negative unless specifically indicated above in HPI.   Relevant past medical history reviewed and updated as indicated.   Allergies and medications reviewed and updated.   Current Outpatient Medications:    aspirin EC 81 MG tablet, Take 81 mg by mouth daily. Swallow whole., Disp: , Rfl:    atorvastatin (LIPITOR) 20 MG tablet, Take 1 tablet (20 mg total) by mouth daily., Disp: 90 tablet, Rfl: 3   bacitracin ophthalmic ointment, Apply to eyelids at bedtime as needed., Disp: 3.5 g, Rfl: 6   ciprofloxacin (CIPRO) 500 MG tablet, Take 1 tablet (500 mg total) by mouth 2 (two) times daily for 7 days., Disp: 14 tablet, Rfl: 0   conjugated estrogens (PREMARIN) vaginal cream, Place 1 Applicatorful vaginally 2 (two) times a week. Place 0.5g nightly twice a week, Disp: 90 g, Rfl: 3   cyclobenzaprine (FLEXERIL) 5 MG tablet, Take 1 tablet (5 mg total) by mouth at bedtime as needed for muscle spasms., Disp: 30 tablet, Rfl: 1   HYDROcodone-acetaminophen (NORCO) 10-325 MG tablet, Take 1 tablet by mouth every 6 (six) hours as needed., Disp: 120 tablet, Rfl: 0   ipratropium (ATROVENT) 0.06 % nasal spray, Place 2 sprays into both nostrils 3 (three) times daily., Disp: 15 mL, Rfl: 5  losartan (COZAAR) 25 MG tablet, Take 1 tablet (25 mg total) by mouth daily., Disp: 90 tablet, Rfl: 3   methenamine (HIPREX) 1 g tablet, Take 1 tablet (1 g total) by mouth 2 (two) times daily with a meal., Disp: 60 tablet, Rfl: 2   Multiple Vitamins-Minerals (MULTIVITAMIN ADULTS) TABS, Take 1 tablet by mouth daily., Disp: , Rfl:    ondansetron (ZOFRAN-ODT) 4 MG disintegrating tablet, Take 1 tablet (4 mg total) by mouth every 8 (eight) hours as needed for nausea or vomiting., Disp: 30 tablet, Rfl: 1   valACYclovir (VALTREX) 1000 MG tablet, Take 1 tablet (1,000 mg  total) by mouth 2 (two) times daily., Disp: 60 tablet, Rfl: 2   albuterol (VENTOLIN HFA) 108 (90 Base) MCG/ACT inhaler, Inhale 1-2 puffs into the lungs every 6 (six) hours as needed for up to 5 days for wheezing or shortness of breath., Disp: 8 g, Rfl: 0   lidocaine 4 %, Place 1-3 patches onto the skin daily. Use for up to 12 hours in a 24 hour period (Patient not taking: Reported on 05/30/2023), Disp: 30 patch, Rfl: 3   memantine (NAMENDA) 5 MG tablet, Take 1 tablet (5 mg total) by mouth 2 (two) times daily. (Patient not taking: Reported on 05/30/2023), Disp: 180 tablet, Rfl: 3   vitamin B-12 (CYANOCOBALAMIN) 1000 MCG tablet, Take 1,000 mcg by mouth daily. (Patient not taking: Reported on 05/30/2023), Disp: , Rfl:   Allergies  Allergen Reactions   Morphine Nausea And Vomiting   Propoxyphene Nausea And Vomiting, Palpitations and Other (See Comments)    Darvocet Causes Sweats   Donepezil Diarrhea and Other (See Comments)   Morphine And Codeine Nausea And Vomiting   Tape Rash and Other (See Comments)    Objective:   BP 138/64   Pulse 80   Temp 98.2 F (36.8 C)   Ht 5\' 6"  (1.676 m)   Wt 205 lb (93 kg)   BMI 33.09 kg/m    Physical Exam Vitals reviewed.  Constitutional:      General: She is not in acute distress.    Appearance: Normal appearance. She is not ill-appearing, toxic-appearing or diaphoretic.  HENT:     Head: Normocephalic and atraumatic.  Eyes:     General: No scleral icterus.       Right eye: No discharge.        Left eye: No discharge.     Conjunctiva/sclera: Conjunctivae normal.  Cardiovascular:     Rate and Rhythm: Normal rate.  Pulmonary:     Effort: Pulmonary effort is normal. No respiratory distress.  Abdominal:     General: Bowel sounds are normal.     Palpations: Abdomen is soft. There is mass (center/lower abdomen).     Tenderness: There is abdominal tenderness.  Musculoskeletal:        General: Normal range of motion.     Cervical back: Normal range  of motion.  Skin:    General: Skin is warm and dry.     Capillary Refill: Capillary refill takes less than 2 seconds.  Neurological:     General: No focal deficit present.     Mental Status: She is alert and oriented to person, place, and time. Mental status is at baseline.  Psychiatric:        Mood and Affect: Mood normal.        Behavior: Behavior normal.        Thought Content: Thought content normal.        Judgment:  Judgment normal.

## 2023-05-31 LAB — URINE CULTURE
MICRO NUMBER:: 15894240
Result:: NO GROWTH
SPECIMEN QUALITY:: ADEQUATE

## 2023-06-02 ENCOUNTER — Other Ambulatory Visit: Payer: Self-pay | Admitting: Family Medicine

## 2023-06-02 DIAGNOSIS — R300 Dysuria: Secondary | ICD-10-CM

## 2023-06-02 NOTE — Progress Notes (Signed)
 Called pt to go over her most recent urine culture She has pseudomonas and is taking cipro She would like to do a TOC culture on Monday which is fine- I ordered a culture and scheduled w the lab

## 2023-06-05 ENCOUNTER — Ambulatory Visit
Admission: RE | Admit: 2023-06-05 | Discharge: 2023-06-05 | Disposition: A | Discharge status: Home / Self Care | Payer: Medicare Other | Source: Ambulatory Visit | Attending: Family Medicine | Admitting: Family Medicine

## 2023-06-05 DIAGNOSIS — G8929 Other chronic pain: Secondary | ICD-10-CM

## 2023-06-05 DIAGNOSIS — R102 Pelvic and perineal pain: Secondary | ICD-10-CM | POA: Diagnosis not present

## 2023-06-05 DIAGNOSIS — N816 Rectocele: Secondary | ICD-10-CM | POA: Diagnosis not present

## 2023-06-05 DIAGNOSIS — Z780 Asymptomatic menopausal state: Secondary | ICD-10-CM | POA: Diagnosis not present

## 2023-06-05 MED ORDER — GADOPICLENOL 0.5 MMOL/ML IV SOLN
9.0000 mL | Freq: Once | INTRAVENOUS | Status: AC | PRN
  Administered 2023-06-05: 9 mL via INTRAVENOUS

## 2023-06-06 ENCOUNTER — Other Ambulatory Visit: Payer: Medicare Other

## 2023-06-06 ENCOUNTER — Encounter: Payer: Self-pay | Admitting: Family Medicine

## 2023-06-06 DIAGNOSIS — R300 Dysuria: Secondary | ICD-10-CM

## 2023-06-07 ENCOUNTER — Encounter: Payer: Medicare Other | Attending: Physical Medicine & Rehabilitation | Admitting: Registered Nurse

## 2023-06-07 VITALS — Ht 66.0 in | Wt 203.0 lb

## 2023-06-07 DIAGNOSIS — M5416 Radiculopathy, lumbar region: Secondary | ICD-10-CM | POA: Diagnosis not present

## 2023-06-07 DIAGNOSIS — M7062 Trochanteric bursitis, left hip: Secondary | ICD-10-CM | POA: Insufficient documentation

## 2023-06-07 DIAGNOSIS — G894 Chronic pain syndrome: Secondary | ICD-10-CM | POA: Diagnosis not present

## 2023-06-07 DIAGNOSIS — M2550 Pain in unspecified joint: Secondary | ICD-10-CM | POA: Insufficient documentation

## 2023-06-07 DIAGNOSIS — M7061 Trochanteric bursitis, right hip: Secondary | ICD-10-CM | POA: Insufficient documentation

## 2023-06-07 DIAGNOSIS — Z5181 Encounter for therapeutic drug level monitoring: Secondary | ICD-10-CM | POA: Diagnosis not present

## 2023-06-07 DIAGNOSIS — Z79891 Long term (current) use of opiate analgesic: Secondary | ICD-10-CM | POA: Insufficient documentation

## 2023-06-07 DIAGNOSIS — M546 Pain in thoracic spine: Secondary | ICD-10-CM | POA: Diagnosis not present

## 2023-06-07 DIAGNOSIS — M170 Bilateral primary osteoarthritis of knee: Secondary | ICD-10-CM | POA: Insufficient documentation

## 2023-06-07 DIAGNOSIS — G8929 Other chronic pain: Secondary | ICD-10-CM | POA: Insufficient documentation

## 2023-06-07 LAB — URINE CULTURE
MICRO NUMBER:: 15921680
Result:: NO GROWTH
SPECIMEN QUALITY:: ADEQUATE

## 2023-06-07 MED ORDER — HYDROCODONE-ACETAMINOPHEN 10-325 MG PO TABS: 1.0000 | ORAL_TABLET | Freq: Four times a day (QID) | ORAL | 0 refills | Status: DC | PRN

## 2023-06-07 NOTE — Progress Notes (Signed)
 Subjective:    Patient ID: Crystal Lee, female    DOB: 1933/06/25, 89 y.o.   MRN: [id number]  HPI ; Crystal Lee is a 89 y.o. female whose appointment was changed to telephone visit, Crystal Lee called office reporting she didn't want to drive due to the weather conditions. I connected with  Crystal Lee by telephone and verified that I am speaking with the correct person using two identifiers.  Location: Patient: In her Home Provider: In the Office    I discussed the limitations, risks, security and privacy concerns of performing an evaluation and management service by telephone and the availability of in person appointments. I also discussed with the patient that there may be a patient responsible charge related to this service. The patient expressed understanding and agreed to proceed.   Crystal Lee telephone visit  appointment for chronic pain and medication refill. She states her pain is located in her mid- lower back pain, bilateral hips, bilateral knee pain and generalized joint pain. She rates her pain 5. Her current exercise regime is walking and performing stretching exercises.  Crystal Lee Morphine  equivalent is 40.00 MME.   Last Oral Swab was Performed on 04/11/2023, it was consistent.    Pain Inventory Average Pain 5 Pain Right Now 5 My pain is sharp, burning, tingling, and aching  In the last 24 hours, has pain interfered with the following? General activity 0 Relation with others 5 Enjoyment of life 0 What TIME of day is your pain at its worst? morning , daytime, evening, and night Sleep (in general) Poor  Pain is worse with: walking, bending, sitting, standing, and some activites Pain improves with: rest and medication Relief from Meds: 4  Family History  Problem Relation Age of Onset   Other Mother        complications from flu   Other Father        unsure of cause   Colon cancer Neg Hx    Social History   Socioeconomic History   Marital status:  Widowed    Spouse name: Not on file   Number of children: 3   Years of education: 16 years   Highest education level: Not on file  Occupational History   Occupation: Retired  Tobacco Use   Smoking status: Former    Current packs/day: 0.00    Average packs/day: 0.5 packs/day for 20.0 years (10.0 ttl pk-yrs)    Types: Cigarettes    Start date: 02/03/1956    Quit date: 02/03/1976    Years since quitting: 47.3   Smokeless tobacco: Never  Vaping Use   Vaping status: Never Used  Substance and Sexual Activity   Alcohol use: No   Drug use: No   Sexual activity: Not Currently    Birth control/protection: Post-menopausal  Other Topics Concern   Not on file  Social History Narrative   Lives at home with husband.   Right-handed.   No caffeine use.   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  (08/07/2021)   Overall Financial Resource Strain (CARDIA)    Difficulty of Paying Living Expenses: Not hard at all  Food Insecurity: No Food Insecurity (08/31/2022)   Hunger Vital Sign    Worried About Running Out of Food in the Last Year: Never true    Ran Out of Food in the Last Year: Never true  Transportation Needs: No Transportation Needs (08/31/2022)   PRAPARE - Transportation  Lack of Transportation (Medical): No    Lack of Transportation (Non-Medical): No  Physical Activity: Inactive (08/31/2022)   Exercise Vital Sign    Days of Exercise per Week: 0 days    Minutes of Exercise per Session: 0 min  Stress: No Stress Concern Present (08/31/2022)   [name] of Occupational Health - Occupational Stress Questionnaire    Feeling of Stress : Not at all  Social Connections: Moderately Isolated (08/07/2021)   Social Connection and Isolation Panel [NHANES]    Frequency of Communication with Friends and Family: More than three times a week    Frequency of Social Gatherings with Friends and Family: More than three times a week    Attends Religious Services: More  than 4 times per year    Active Member of [employer] or Organizations: No    Attends Banker Meetings: Never    Marital Status: Widowed   Past Surgical History:  Procedure Laterality Date   26 HOUR PH STUDY N/A 06/21/2018   Procedure: 24 HOUR PH STUDY;  Surgeon: San Sandor GAILS, DO;  Location: [facility];  Service: Gastroenterology;  Laterality: N/A;  with impedance   ABDOMINAL HYSTERECTOMY  2010   ANAL RECTAL MANOMETRY N/A 09/19/2019   Procedure: ANO RECTAL MANOMETRY;  Surgeon: Shila Gustav GAILS, MD;  Location: [facility];  Service: Endoscopy;  Laterality: N/A;   APPENDECTOMY  1949   BACK SURGERY  1973   BIOPSY  06/10/2020   Procedure: BIOPSY;  Surgeon: San Sandor GAILS, DO;  Location: [facility];  Service: Gastroenterology;;  EGD and COLON   BREAST SURGERY     CATARACT EXTRACTION Bilateral 2011   CHOLECYSTECTOMY     COLON RESECTION N/A 06/12/2020   Procedure: LAPAROSCOPIC COLON RESECTION;  Surgeon: Vernetta Berg, MD;  Location: [facility];  Service: General;  Laterality: N/A;   COLONOSCOPY WITH PROPOFOL  N/A 06/10/2020   Procedure: COLONOSCOPY WITH PROPOFOL ;  Surgeon: San Sandor GAILS, DO;  Location: [facility];  Service: Gastroenterology;  Laterality: N/A;   ESOPHAGEAL MANOMETRY N/A 06/21/2018   Procedure: ESOPHAGEAL MANOMETRY (EM);  Surgeon: San Sandor GAILS, DO;  [facility]: [facility];  Service: Gastroenterology;  Laterality: N/A;   ESOPHAGOGASTRODUODENOSCOPY (EGD) WITH PROPOFOL  N/A 06/10/2020   Procedure: ESOPHAGOGASTRODUODENOSCOPY (EGD) WITH PROPOFOL ;  Surgeon: San Sandor GAILS, DO;  Location: [facility];  Service: Gastroenterology;  Laterality: N/A;   GALLBLADDER SURGERY  2015   INCISIONAL HERNIA REPAIR N/A 09/16/2021   Procedure: OPEN INCISIONAL HERNIA REPAIR WITH MESH;  Surgeon: Vernetta Berg, MD;  Location: [facility];  Service: General;  Laterality: N/A;   JOINT REPLACEMENT     RT TOTAL HIP / RT TOTAL KNEE   MASTECTOMY  1998   BILATERAL    PH  IMPEDANCE STUDY  06/21/2018   Procedure: PH IMPEDANCE STUDY;  Surgeon: San Sandor GAILS, DO;  Location: [facility];  Service: Gastroenterology;;   POLYPECTOMY  06/10/2020   Procedure: POLYPECTOMY;  Surgeon: San Sandor GAILS, DO;  [facility];  Service: Gastroenterology;;   SKIN CANCER EXCISION  2016   RT SIDE OF NOSE   SUBMUCOSAL TATTOO INJECTION  06/10/2020   Procedure: SUBMUCOSAL TATTOO INJECTION;  Surgeon: San Sandor GAILS, DO;  [facility]: [facility];  Service: Gastroenterology;;   TONSILLECTOMY  1953   TOTAL HIP ARTHROPLASTY  2010   RIGHT   TOTAL HIP ARTHROPLASTY Left 05/09/2015   Procedure: LEFT TOTAL HIP ARTHROPLASTY ANTERIOR APPROACH;  Surgeon: Dempsey Moan, MD;  Location: [facility];  Service: Orthopedics;  Laterality: Left;  TOTAL KNEE ARTHROPLASTY  2001   TUMOR REMOVAL  2012   ABDOMINAL - NON CANCEROUS   Past Surgical History:  Procedure Laterality Date   33 HOUR PH STUDY N/A 06/21/2018   Procedure: 24 HOUR PH STUDY;  Surgeon: San Sandor GAILS, DO;  [facility];  Service: Gastroenterology;  Laterality: N/A;  with impedance   ABDOMINAL HYSTERECTOMY  2010   ANAL RECTAL MANOMETRY N/A 09/19/2019   Procedure: ANO RECTAL MANOMETRY;  Surgeon: Shila Gustav GAILS, MD;  [facility];  Service: Endoscopy;  Laterality: N/A;   APPENDECTOMY  1949   BACK SURGERY  1973   BIOPSY  06/10/2020   Procedure: BIOPSY;  Surgeon: San Sandor GAILS, DO;  [facility];  Service: Gastroenterology;;  EGD and COLON   BREAST SURGERY     CATARACT EXTRACTION Bilateral 2011   CHOLECYSTECTOMY     COLON RESECTION N/A 06/12/2020   Procedure: LAPAROSCOPIC COLON RESECTION;  Surgeon: Vernetta Berg, MD;  [facility]: [facility];  Service: General;  Laterality: N/A;   COLONOSCOPY WITH PROPOFOL  N/A 06/10/2020   Procedure: COLONOSCOPY WITH PROPOFOL ;  Surgeon: San Sandor GAILS, DO;  [facility];  Service: Gastroenterology;  Laterality: N/A;   ESOPHAGEAL MANOMETRY  N/A 06/21/2018   Procedure: ESOPHAGEAL MANOMETRY (EM);  Surgeon: San Sandor GAILS, DO;  [facility];  Service: Gastroenterology;  Laterality: N/A;   ESOPHAGOGASTRODUODENOSCOPY (EGD) WITH PROPOFOL  N/A 06/10/2020   Procedure: ESOPHAGOGASTRODUODENOSCOPY (EGD) WITH PROPOFOL ;  Surgeon: San Sandor GAILS, DO;  [facility];  Service: Gastroenterology;  Laterality: N/A;   GALLBLADDER SURGERY  2015   INCISIONAL HERNIA REPAIR N/A 09/16/2021   Procedure: OPEN INCISIONAL HERNIA REPAIR WITH MESH;  Surgeon: Vernetta Berg, MD;  [facility];  Service: General;  Laterality: N/A;   JOINT REPLACEMENT     RT TOTAL HIP / RT TOTAL KNEE   MASTECTOMY  1998   BILATERAL    PH IMPEDANCE STUDY  06/21/2018   Procedure: PH IMPEDANCE STUDY;  Surgeon: San Sandor GAILS, DO;  [facility];  Service: Gastroenterology;;   POLYPECTOMY  06/10/2020   Procedure: POLYPECTOMY;  Surgeon: San Sandor GAILS, DO;  [facility];  Service: Gastroenterology;;   SKIN CANCER EXCISION  2016   RT SIDE OF NOSE   SUBMUCOSAL TATTOO INJECTION  06/10/2020   Procedure: SUBMUCOSAL TATTOO INJECTION;  Surgeon: San Sandor GAILS, DO;  [facility];  Service: Gastroenterology;;   TONSILLECTOMY  1953   TOTAL HIP ARTHROPLASTY  2010   RIGHT   TOTAL HIP ARTHROPLASTY Left 05/09/2015   Procedure: LEFT TOTAL HIP ARTHROPLASTY ANTERIOR APPROACH;  Surgeon: Dempsey Moan, MD;  Location: [facility];  Service: Orthopedics;  Laterality: Left;   TOTAL KNEE ARTHROPLASTY  2001   TUMOR REMOVAL  2012   ABDOMINAL - NON CANCEROUS   Past Medical History:  Diagnosis Date   Arthritis    Cancer (HCC)    HX BREAST CANCER/ SKIN CANCER   Colon cancer (HCC)    Complication of anesthesia    N/V WITH MORPHINE    Difficulty sleeping    Diverticulosis    Fractured hip (HCC)    LEFT - AUG 2016   GERD (gastroesophageal reflux disease)    Hyperlipidemia    Hypertension    Melanoma (HCC)    Neuropathy    Nocturia     Osteopenia    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  Opioid Risk Score:   Fall Risk Score:  `1  Depression screen PHQ 2/9     05/30/2023    1:26 PM 04/11/2023   11:04 AM 02/14/2023   11:05 AM 01/14/2023   11:07 AM 12/20/2022   11:43 AM 11/15/2022   11:26 AM 10/21/2022   11:16 AM  Depression screen PHQ 2/9  Decreased Interest 1 0 0 0 0 0 0  Down, Depressed, Hopeless 0 0 0 0 0 0 0  PHQ - 2 Score 1 0 0 0 0 0 0      Review of Systems  Musculoskeletal:  Positive for back pain and gait problem.  All other systems reviewed and are negative.     Objective:   Physical Exam Vitals and nursing note reviewed.  Musculoskeletal:     Comments: No Physical Exam Performed: Telephone Visit         Assessment & Plan:  Chronic Bilateral Leg pain: Paresthesia: Continue to Monitor. 06/07/2023 2. Paresthesia Harrie Radiculitis: Crystal Lee has weaned herself off the Lyrica  due to daytime drowsiness. We will continue to monitor. 06/07/2023 3. Pain of Left Wrist/ : No complaints today. S/P Carpal Tunnel Release on 06/03/2017 by Dr. Colon. Dr. Colon Following. 0107/2025. 4. Fracture of superior pubic ramus.  Dr. Melodi Following.  Continue to monitor. 06/07/2023. 5. Bilateral Knee OA: Continue Voltaren  Gel.  Continue to monitor. Orthopedist following. 06/07/2023 6. Polyarthralgia: Continue to alternate with heat and ice therapy. Continue current medication regime. Continue to monitor. 06/07/2023. 7. Chronic Pain Syndrome: Refilled::Hydrocodone   10/325 one tablet 4 times a day as needed for pain #120. We will continue the opioid monitoring program, this consists of regular clinic visits, examinations, urine drug screen, pill counts as well as use of [place]  Controlled Substance  Reporting system. A 12 month History has been reviewed on the [place]  Controlled Substance Reporting System on 06/07/2023. 8. Lumbar Compression Fracture L2 and L3:  Crystal Lee refused physical therapy. Continue with rest/ heat therapy. Continue to Monitor 06/07/2023. 9. Muscle Spasm: Continue  Flexeril  5 mg at HS. Continue to monitor. 06/07/2023 10. Right Shoulder Pain: No complaints today. Continue HEP as Tolerated. Alternate Ice and Heat Therapy. 06/07/2023  11. Bilateral  Greater Trochanter Bursitis:  Continue to Monitor. Continue HEP as Tolerated. 06/07/2023     F/U in 1 Month  Telephone Visit Established Patient Location of Patient: In Her Home Location of Provider: In the Office  Total Time: 10 Minutes

## 2023-06-11 ENCOUNTER — Encounter: Payer: Self-pay | Admitting: Registered Nurse

## 2023-06-13 ENCOUNTER — Encounter: Payer: Self-pay | Admitting: Obstetrics and Gynecology

## 2023-06-13 ENCOUNTER — Ambulatory Visit (INDEPENDENT_AMBULATORY_CARE_PROVIDER_SITE_OTHER): Payer: Medicare Other | Admitting: Obstetrics and Gynecology

## 2023-06-13 ENCOUNTER — Other Ambulatory Visit (HOSPITAL_COMMUNITY)
Admission: RE | Admit: 2023-06-13 | Discharge: 2023-06-13 | Disposition: A | Discharge status: Home / Self Care | Payer: Medicare Other | Source: Ambulatory Visit | Attending: Obstetrics and Gynecology | Admitting: Obstetrics and Gynecology

## 2023-06-13 VITALS — BP 131/75 | HR 91

## 2023-06-13 DIAGNOSIS — N898 Other specified noninflammatory disorders of vagina: Secondary | ICD-10-CM

## 2023-06-13 DIAGNOSIS — N390 Urinary tract infection, site not specified: Secondary | ICD-10-CM | POA: Insufficient documentation

## 2023-06-13 DIAGNOSIS — Z87440 Personal history of urinary (tract) infections: Secondary | ICD-10-CM | POA: Diagnosis not present

## 2023-06-13 DIAGNOSIS — N952 Postmenopausal atrophic vaginitis: Secondary | ICD-10-CM

## 2023-06-13 DIAGNOSIS — R350 Frequency of micturition: Secondary | ICD-10-CM | POA: Insufficient documentation

## 2023-06-13 LAB — POCT URINALYSIS DIPSTICK
Bilirubin, UA: NEGATIVE
Blood, UA: NEGATIVE
Glucose, UA: NEGATIVE
Ketones, UA: NEGATIVE
Leukocytes, UA: NEGATIVE
Nitrite, UA: NEGATIVE
Protein, UA: NEGATIVE
Spec Grav, UA: 1.015 (ref 1.010–1.025)
Urobilinogen, UA: 0.2 U/dL
pH, UA: 6.5 (ref 5.0–8.0)

## 2023-06-13 MED ORDER — ESTRADIOL 0.1 MG/GM VA CREA: 0.5000 g | TOPICAL_CREAM | VAGINAL | 11 refills | Status: AC

## 2023-06-13 MED ORDER — SULFAMETHOXAZOLE-TRIMETHOPRIM 400-80 MG PO TABS: 0.5000 | ORAL_TABLET | Freq: Every day | ORAL | 0 refills | Status: DC

## 2023-06-13 NOTE — Patient Instructions (Addendum)
 We did a catheterized urine today to check for any bacteria.   Please use the estrogen cream nightly for the next two weeks.   Stop the hiprex and start low dose bactrim for UTI prophylaxis.

## 2023-06-13 NOTE — Progress Notes (Signed)
 [facility] Return Visit  SUBJECTIVE  History of Present Illness: Crystal Lee is a 89 y.o. female seen in follow-up for recent hospitalization for UTI.  Her culture from the hospital showed positive Pseudomonas that was intermediately sensitive to levofloxacin.  Plan at last visit was re-start Hiprex  for UTI prophylaxis.    Patient reports significant concerns.  She states that she has been having a metallic taste in her mouth, belching, fatigue, and decreased appetite.  She states that the last time this happened that she was diagnosed with cancer and she is highly concerned about having cancer either in the bowel or the bladder.  Patient is requesting a cystoscopy from our office and is attempting to contact her GI office to see about a colonoscopy and an endoscopy.  She reports she attempted to contact her GI office and was scheduled with a different provider and she would really prefer to see Dr. San.   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   Past Surgical History: She  has a past surgical history that includes Breast surgery; Cholecystectomy; Total hip arthroplasty (2010); Mastectomy (1998); Joint replacement; Total knee arthroplasty (2001); Back surgery (1973); Skin cancer excision (2016); Appendectomy (1949); Tumor removal (2012); Total hip arthroplasty (Left, 05/09/2015); Abdominal hysterectomy (2010); Gallbladder surgery (2015); Tonsillectomy (1953); Cataract extraction (Bilateral, 2011); Esophageal manometry (N/A, 06/21/2018);  24 hour ph study (N/A, 06/21/2018); PH impedance study (06/21/2018); Anal Rectal manometry (N/A, 09/19/2019); Colonoscopy with propofol  (N/A, 06/10/2020); Esophagogastroduodenoscopy (egd) with propofol  (N/A, 06/10/2020); biopsy (06/10/2020); Submucosal tattoo injection (06/10/2020); polypectomy (06/10/2020); Colon resection (N/A, 06/12/2020); and Incisional hernia repair (N/A, 09/16/2021).   Medications: She has a current medication list which includes the following prescription(s): aspirin  ec, atorvastatin , bacitracin , cyclobenzaprine , estradiol , hydrocodone -acetaminophen , ipratropium, lidocaine , losartan , memantine , multivitamin adults, ondansetron , sulfamethoxazole -trimethoprim , valacyclovir , cyanocobalamin , and albuterol .   Allergies: Patient is allergic to morphine , propoxyphene, donepezil , morphine  and codeine, and tape.   Social History: Patient  reports that she quit smoking about 47 years ago. Her smoking use included cigarettes. She started smoking about 67 years ago. She has a 10 pack-year smoking history. She has never used smokeless tobacco. She reports that she does not drink alcohol and does not use drugs.     OBJECTIVE    EXAM: MRI PELVIS WITHOUT AND WITH CONTRAST   TECHNIQUE: Multiplanar multisequence MR imaging of the pelvis was performed both before and after administration of intravenous contrast.   CONTRAST:  9 cc of vueway    COMPARISON:  CT AP 09/29/2022   FINDINGS: Urinary Tract: There is no enhancing bladder abnormality. No significant bladder wall thickening or inflammation.   Bowel: The visualized lower abdominal and pelvic bowel loops are nondilated. No wall thickening, enhancement or inflammation identified. Sigmoid diverticulosis noted without signs of acute diverticulitis. Moderate stool burden is identified within the rectum. Mild pelvic floor laxity with small rectocele.   Vascular/Lymphatic: No pathologically enlarged lymph nodes. No significant vascular  abnormality seen.   Reproductive: Status post hysterectomy. No adnexal mass identified.   Other:  No free fluid or fluid collections identified.   Musculoskeletal: Previous bilateral hip arthroplasty. Left sacral hemangioma again identified. Degenerative disc disease identified within the imaged portions of the lower lumbar  spine.   IMPRESSION: 1. No acute findings within the pelvis. 2. Sigmoid diverticulosis without signs of acute diverticulitis. 3. Mild pelvic floor laxity with small rectocele. 4. Status post hysterectomy without adnexal mass.     Electronically Signed   By: Waddell Calk M.D.   On: 06/05/2023 11:36 Susceptibility data from last 90 days. Collected Specimen Info Organism CEFEPIME Ceftazidime Ciprofloxacin  Gentamicin Susc lslt Imipenem LEVOFLOXACIN Piperacillin + Tazobactam Tobramycin  Susc lslt  05/27/23 Urine Pseudomonas aeruginosa  S  S  S  S  S  I  S  S  05/23/23 Urine, Clean Catch Pseudomonas aeruginosa  S  S  S  S  S   S     Lab Results  Component Value Date   COLORU Dark Yellow 06/13/2023   CLARITYU clear 06/13/2023   GLUCOSEUR Negative 06/13/2023   BILIRUBINUR negative 06/13/2023   KETONESU negative 06/13/2023   SPECGRAV 1.015 06/13/2023   RBCUR negative 06/13/2023   PHUR 6.5 06/13/2023   PROTEINUR Negative 06/13/2023   UROBILINOGEN 0.2 06/13/2023   LEUKOCYTESUR Negative 06/13/2023     Physical Exam: Vitals:   06/13/23 1108  BP: 131/75  Pulse: 91   Gen: No apparent distress, A&O x 3.  Detailed Urogynecologic Evaluation:  Vaginal exam revealed white discharge in the vagina concerning for possible yeast.  Aptima swab obtained.  Patient also has significant redness and irritation of the vulva and inside the vaginal canal consistent with vaginal atrophy.  No obvious signs of any mass, polyp, or active bleeding noted in the vagina.  Patient's rectocele is noted to be coming to proximately -1 on exam.  Patient is very tender to palpation on the  posterior wall during digital exam.   ASSESSMENT AND PLAN    [name] is a 89 y.o. with:  1. Recurrent UTI   2. Urinary frequency   3. Vaginal discharge   4. Vaginal atrophy    Patient had done well previously with prophylactic antibiotics for UTI.  I feel that with her advanced age and the risks of her becoming uroseptic and having significant comorbidities that it is worth going back to the prophylactic antibiotics.  She last had this in 2023 so there has been a large amount of time between prophylactic dosing.  Half tablet Bactrim  daily ordered for patient's for the next 6 months.Patient reports with her other symptoms she would like to do a cystoscopy to rule out cancer or other concerns in the bladder. Dr. Marilynne is agreeable due to her hx of recurrent UTIs and will plan for cystoscopy.  Patient is unable to tell if daily Gemtesa 75mg  daily has been helpful for her due to all the other issues that have been distressing her.  Patient has a white discharge on exam today that is concerning for yeast. Aptima swab ordered to rule out yeast/BV.  Patient has significant vaginal atrophy on exam, she reported she has not been doing the estrogen cream twice weekly. I have sent in a new script to restart her on estrogen cream.  Patient to return for Cysto with Dr. Marilynne. This office also contacted GI as patient reports she has had significant concerning GI symptoms and she needs an appointment with them.    [employer] G Jakara Blatter, NP

## 2023-06-14 LAB — URINE CULTURE: Culture: NO GROWTH

## 2023-06-15 ENCOUNTER — Other Ambulatory Visit: Payer: Self-pay | Admitting: Obstetrics and Gynecology

## 2023-06-15 LAB — CERVICOVAGINAL ANCILLARY ONLY
Bacterial Vaginitis (gardnerella): NEGATIVE
Candida Glabrata: NEGATIVE
Candida Vaginitis: POSITIVE — AB
Comment: NEGATIVE
Comment: NEGATIVE
Comment: NEGATIVE

## 2023-06-15 MED ORDER — FLUCONAZOLE 150 MG PO TABS: 150.0000 mg | ORAL_TABLET | Freq: Once | ORAL | 0 refills | Status: AC

## 2023-06-15 NOTE — Progress Notes (Signed)
 Please call patient and inform her that she has a yeast infection but her urine culture was clear. Dose of diflucan  sent to patient's pharmacy

## 2023-06-22 ENCOUNTER — Other Ambulatory Visit: Payer: Self-pay | Admitting: *Deleted

## 2023-06-22 DIAGNOSIS — D509 Iron deficiency anemia, unspecified: Secondary | ICD-10-CM

## 2023-06-22 DIAGNOSIS — C50911 Malignant neoplasm of unspecified site of right female breast: Secondary | ICD-10-CM

## 2023-06-22 DIAGNOSIS — D696 Thrombocytopenia, unspecified: Secondary | ICD-10-CM

## 2023-06-23 ENCOUNTER — Inpatient Hospital Stay: Payer: Medicare Other | Attending: Hematology & Oncology

## 2023-06-23 ENCOUNTER — Other Ambulatory Visit: Payer: Self-pay

## 2023-06-23 ENCOUNTER — Inpatient Hospital Stay: Payer: Medicare Other | Admitting: Hematology & Oncology

## 2023-06-23 ENCOUNTER — Inpatient Hospital Stay: Payer: Medicare Other

## 2023-06-23 ENCOUNTER — Encounter: Payer: Self-pay | Admitting: Hematology & Oncology

## 2023-06-23 VITALS — BP 129/59 | HR 76 | Temp 97.8°F | Resp 17 | Ht 66.0 in | Wt 202.0 lb

## 2023-06-23 DIAGNOSIS — Z79899 Other long term (current) drug therapy: Secondary | ICD-10-CM | POA: Insufficient documentation

## 2023-06-23 DIAGNOSIS — C50911 Malignant neoplasm of unspecified site of right female breast: Secondary | ICD-10-CM | POA: Diagnosis not present

## 2023-06-23 DIAGNOSIS — D696 Thrombocytopenia, unspecified: Secondary | ICD-10-CM | POA: Diagnosis not present

## 2023-06-23 DIAGNOSIS — B965 Pseudomonas (aeruginosa) (mallei) (pseudomallei) as the cause of diseases classified elsewhere: Secondary | ICD-10-CM | POA: Insufficient documentation

## 2023-06-23 DIAGNOSIS — Z170 Estrogen receptor positive status [ER+]: Secondary | ICD-10-CM | POA: Diagnosis not present

## 2023-06-23 DIAGNOSIS — M810 Age-related osteoporosis without current pathological fracture: Secondary | ICD-10-CM | POA: Diagnosis not present

## 2023-06-23 DIAGNOSIS — C186 Malignant neoplasm of descending colon: Secondary | ICD-10-CM

## 2023-06-23 DIAGNOSIS — Z853 Personal history of malignant neoplasm of breast: Secondary | ICD-10-CM | POA: Insufficient documentation

## 2023-06-23 DIAGNOSIS — C184 Malignant neoplasm of transverse colon: Secondary | ICD-10-CM | POA: Diagnosis not present

## 2023-06-23 DIAGNOSIS — N390 Urinary tract infection, site not specified: Secondary | ICD-10-CM | POA: Diagnosis not present

## 2023-06-23 DIAGNOSIS — D509 Iron deficiency anemia, unspecified: Secondary | ICD-10-CM

## 2023-06-23 LAB — RETICULOCYTES
Immature Retic Fract: 5.5 % (ref 2.3–15.9)
RBC.: 4.54 MIL/uL (ref 3.87–5.11)
Retic Count, Absolute: 61.3 10*3/uL (ref 19.0–186.0)
Retic Ct Pct: 1.4 % (ref 0.4–3.1)

## 2023-06-23 LAB — CBC WITH DIFFERENTIAL (CANCER CENTER ONLY)
Abs Immature Granulocytes: 0.01 10*3/uL (ref 0.00–0.07)
Basophils Absolute: 0 10*3/uL (ref 0.0–0.1)
Basophils Relative: 1 %
Eosinophils Absolute: 0.4 10*3/uL (ref 0.0–0.5)
Eosinophils Relative: 6 %
HCT: 41.7 % (ref 36.0–46.0)
Hemoglobin: 13.7 g/dL (ref 12.0–15.0)
Immature Granulocytes: 0 %
Lymphocytes Relative: 37 %
Lymphs Abs: 2.3 10*3/uL (ref 0.7–4.0)
MCH: 30 pg (ref 26.0–34.0)
MCHC: 32.9 g/dL (ref 30.0–36.0)
MCV: 91.2 fL (ref 80.0–100.0)
Monocytes Absolute: 0.5 10*3/uL (ref 0.1–1.0)
Monocytes Relative: 8 %
Neutro Abs: 3.1 10*3/uL (ref 1.7–7.7)
Neutrophils Relative %: 48 %
Platelet Count: 130 10*3/uL — ABNORMAL LOW (ref 150–400)
RBC: 4.57 MIL/uL (ref 3.87–5.11)
RDW: 13.9 % (ref 11.5–15.5)
WBC Count: 6.3 10*3/uL (ref 4.0–10.5)
nRBC: 0 % (ref 0.0–0.2)

## 2023-06-23 LAB — IRON AND IRON BINDING CAPACITY (CC-WL,HP ONLY)
Iron: 76 ug/dL (ref 28–170)
Saturation Ratios: 19 % (ref 10.4–31.8)
TIBC: 398 ug/dL (ref 250–450)
UIBC: 322 ug/dL (ref 148–442)

## 2023-06-23 LAB — CMP (CANCER CENTER ONLY)
ALT: 12 U/L (ref 0–44)
AST: 18 U/L (ref 15–41)
Albumin: 4.3 g/dL (ref 3.5–5.0)
Alkaline Phosphatase: 60 U/L (ref 38–126)
Anion gap: 7 (ref 5–15)
BUN: 12 mg/dL (ref 8–23)
CO2: 27 mmol/L (ref 22–32)
Calcium: 9.6 mg/dL (ref 8.9–10.3)
Chloride: 105 mmol/L (ref 98–111)
Creatinine: 0.56 mg/dL (ref 0.44–1.00)
GFR, Estimated: >60 mL/min (ref >60)
Glucose, Bld: 114 mg/dL — ABNORMAL HIGH (ref 70–99)
Potassium: 4.4 mmol/L (ref 3.5–5.1)
Sodium: 139 mmol/L (ref 135–145)
Total Bilirubin: 0.6 mg/dL (ref 0.0–1.2)
Total Protein: 7.1 g/dL (ref 6.5–8.1)

## 2023-06-23 LAB — CEA (ACCESS): CEA (CHCC): 3.02 ng/mL (ref 0.00–5.00)

## 2023-06-23 LAB — FERRITIN: Ferritin: 26 ng/mL (ref 11–307)

## 2023-06-23 NOTE — Progress Notes (Signed)
Hematology and Oncology Follow Up Visit  Crystal Lee [id number] 04/20/1934 89 y.o. 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 Yearly -- next dose on 11/2022    Interim History:  Crystal Lee is here today for follow-up.  We last saw back in August.  Since then, she has been doing okay although she had a horrible time over the holidays.  She had problems with a UTI.  She apparently is going to have a cystoscopy.  She has not been eating all that well.  Again, she been dealing with his UTI.  Cultures show that this was Pseudomonas.  There is pretty much sensitive to everything although there was some intermediate sensitivity to Levaquin.  She, again, has got have a cystoscopy.  She has had no cough.  There is been no problems with COVID.  She has had no issues with rashes.  There is been no bleeding although she has some hematuria.  She has had no leg swelling.  Her last CEA level was 2.74.  This was back in July.  She, thankfully, has been able to have help from her daughters.  Currently, I would say that her performance status is probably ECOG 2.   Wt Readings from Last 3 Encounters:  06/23/23 202 lb (91.6 kg)  06/07/23 203 lb (92.1 kg)  05/30/23 205 lb (93 kg)    Medications:  Allergies as of 06/23/2023       Reactions   Morphine Nausea And Vomiting   Propoxyphene Nausea And Vomiting, Palpitations, Other (See Comments)   Darvocet Causes Sweats   Donepezil Diarrhea, Other (See Comments)   Morphine And Codeine Nausea And Vomiting   Tape Rash, Other (See Comments)        Medication List         Accurate as of June 23, 2023  1:00 PM. If you have any questions, ask your nurse or doctor.          albuterol 108 (90 Base) MCG/ACT inhaler Commonly known as: VENTOLIN HFA Inhale 1-2 puffs into the lungs every 6 (six) hours as needed for up to 5 days for wheezing or shortness of breath.   aspirin EC 81 MG tablet Take 81 mg by mouth daily. Swallow whole.   atorvastatin 20 MG tablet Commonly known as: LIPITOR Take 1 tablet (20 mg total) by mouth daily.   bacitracin ophthalmic ointment Apply to eyelids at bedtime as needed.   cyanocobalamin 1000 MCG tablet Commonly known as: VITAMIN B12 Take 1,000 mcg by mouth daily.   cyclobenzaprine 5 MG tablet Commonly known as: FLEXERIL Take 1 tablet (5 mg total) by mouth at bedtime as needed for muscle spasms.   estradiol 0.1 MG/GM vaginal cream Commonly known as: ESTRACE Place 0.5 g vaginally 2 (two) times a week. Place 0.5g nightly for two weeks then twice a week after   fluconazole 150 MG tablet Commonly known as: DIFLUCAN Take 150 mg by mouth once.   HYDROcodone-acetaminophen 10-325 MG tablet Commonly known as: NORCO Take 1 tablet by mouth every 6 (six) hours as needed.   ipratropium 0.06 % nasal spray Commonly known  as: ATROVENT Place 2 sprays into both nostrils 3 (three) times daily.   lidocaine 4 % Place 1-3 patches onto the skin daily. Use for up to 12 hours in a 24 hour period   losartan 25 MG tablet Commonly known as: COZAAR Take 1 tablet (25 mg total) by mouth daily.   memantine 5 MG tablet Commonly known as: NAMENDA Take 1 tablet (5 mg total) by mouth 2 (two) times daily.   Multivitamin Adults Tabs Take 1 tablet by mouth daily.   ondansetron 4 MG disintegrating tablet Commonly known as: ZOFRAN-ODT Take 1 tablet (4 mg total) by mouth every 8 (eight) hours as needed for nausea or vomiting.   sulfamethoxazole-trimethoprim 400-80 MG tablet Commonly known as: Bactrim Take 0.5 tablets by mouth  daily.   valACYclovir 1000 MG tablet Commonly known as: Valtrex Take 1 tablet (1,000 mg total) by mouth 2 (two) times daily.        Allergies:  Allergies  Allergen Reactions   Morphine Nausea And Vomiting   Propoxyphene Nausea And Vomiting, Palpitations and Other (See Comments)    Darvocet Causes Sweats   Donepezil Diarrhea and Other (See Comments)   Morphine And Codeine Nausea And Vomiting   Tape Rash and Other (See Comments)    Past Medical History, Surgical history, Social history, and Family History were reviewed and updated.  Review of Systems: Review of Systems  Constitutional:  Positive for chills, fever and malaise/fatigue.  HENT: Negative.    Eyes: Negative.   Respiratory: Negative.    Cardiovascular: Negative.   Gastrointestinal: Negative.   Genitourinary:  Positive for dysuria, hematuria and urgency.  Musculoskeletal: Negative.   Skin: Negative.   Neurological: Negative.   Endo/Heme/Allergies: Negative.   Psychiatric/Behavioral: Negative.       Physical Exam:  height is 5\' 6"  (1.676 m) and weight is 202 lb (91.6 kg). Her oral temperature is 97.8 F (36.6 C). Her blood pressure is 129/59 (abnormal) and her pulse is 76. Her respiration is 17 and oxygen saturation is 96%.   Wt Readings from Last 3 Encounters:  06/23/23 202 lb (91.6 kg)  06/07/23 203 lb (92.1 kg)  05/30/23 205 lb (93 kg)   Physical Exam Vitals reviewed.  Constitutional:      Comments: Breast exam shows bilateral mastectomies.  She has good mastectomy scars.  There is no erythema or warmth.  There is no nodularity on the chest wall.  There is no bilateral axillary adenopathy.  HENT:     Head: Normocephalic and atraumatic.  Eyes:     Pupils: Pupils are equal, round, and reactive to light.  Cardiovascular:     Rate and Rhythm: Normal rate and regular rhythm.     Heart sounds: Normal heart sounds.  Pulmonary:     Effort: Pulmonary effort is normal.     Breath sounds: Normal breath  sounds.  Abdominal:     General: Bowel sounds are normal.     Palpations: Abdomen is soft.     Comments: Abdominal exam is soft.  She has decent bowel sounds.  She has well-healed laparoscopy scars.  There is no fluid wave.  There is no palpable liver or spleen tip.  Musculoskeletal:        General: No tenderness or deformity. Normal range of motion.     Cervical back: Normal range of motion.  Lymphadenopathy:     Cervical: No cervical adenopathy.  Skin:    General: Skin is warm and dry.     Findings:  No erythema or rash.  Neurological:     Mental Status: She is alert and oriented to person, place, and time.  Psychiatric:        Behavior: Behavior normal.        Thought Content: Thought content normal.        Judgment: Judgment normal.      Lab Results  Component Value Date   WBC 6.3 06/23/2023   HGB 13.7 06/23/2023   HCT 41.7 06/23/2023   MCV 91.2 06/23/2023   PLT 130 (L) 06/23/2023   Lab Results  Component Value Date   FERRITIN 63 12/21/2022   IRON 75 12/21/2022   TIBC 344 12/21/2022   UIBC 269 12/21/2022   IRONPCTSAT 22 12/21/2022   Lab Results  Component Value Date   RETICCTPCT 1.4 06/23/2023   RBC 4.57 06/23/2023   RBC 4.54 06/23/2023   No results found for: "KPAFRELGTCHN", "LAMBDASER", "KAPLAMBRATIO" Lab Results  Component Value Date   IGGSERUM 1,085 02/09/2017   IGMSERUM 336 (H) 02/09/2017   No results found for: "TOTALPROTELP", "ALBUMINELP", "A1GS", "A2GS", "BETS", "BETA2SER", "GAMS", "MSPIKE", "SPEI"   Chemistry      Component Value Date/Time   NA 137 05/23/2023 0934   NA 139 02/25/2020 1627   K 4.2 05/23/2023 0934   CL 104 05/23/2023 0934   CO2 24 05/23/2023 0934   BUN 10 05/23/2023 0934   BUN 11 02/25/2020 1627   CREATININE 0.61 05/23/2023 0934   CREATININE 0.58 12/21/2022 1049      Component Value Date/Time   CALCIUM 9.4 05/23/2023 0934   ALKPHOS 98 05/23/2023 0934   AST 30 05/23/2023 0934   AST 29 12/21/2022 1049   ALT 21 05/23/2023  0934   ALT 20 12/21/2022 1049   BILITOT 1.1 05/23/2023 0934   BILITOT 0.5 12/21/2022 1049      Impression and Plan: Ms. Lonzo is a very pleasant 89 yo caucasian female with history of bilateral stage Ia breast cancer diagnosed back in 1998.   She also has had locally advanced colon cancer stage IIIb with 3+ lymph nodes.  She completed all of her adjuvant therapy, with Xeloda, in July 2022.  Hopefully, the cystoscopy will help her out.  I do not see any problems with respect to her past history of malignancy.  We will plan to get her back to see [place] in another 3 to 4 months.   Josph Macho, MD 1/23/20251:00 PM

## 2023-06-30 ENCOUNTER — Ambulatory Visit (INDEPENDENT_AMBULATORY_CARE_PROVIDER_SITE_OTHER): Payer: Medicare Other | Admitting: Obstetrics and Gynecology

## 2023-06-30 ENCOUNTER — Encounter: Payer: Self-pay | Admitting: Hematology & Oncology

## 2023-06-30 ENCOUNTER — Other Ambulatory Visit (HOSPITAL_COMMUNITY)
Admission: RE | Admit: 2023-06-30 | Discharge: 2023-06-30 | Disposition: A | Discharge status: Home / Self Care | Payer: Medicare Other | Source: Ambulatory Visit | Attending: Obstetrics and Gynecology | Admitting: Obstetrics and Gynecology

## 2023-06-30 VITALS — BP 151/80 | HR 83

## 2023-06-30 DIAGNOSIS — N390 Urinary tract infection, site not specified: Secondary | ICD-10-CM

## 2023-06-30 DIAGNOSIS — Z87440 Personal history of urinary (tract) infections: Secondary | ICD-10-CM

## 2023-06-30 DIAGNOSIS — R350 Frequency of micturition: Secondary | ICD-10-CM

## 2023-06-30 MED ORDER — LIDOCAINE HCL URETHRAL/MUCOSAL 2 % EX GEL: 1.0000 | Freq: Once | CUTANEOUS | Status: AC

## 2023-06-30 NOTE — Progress Notes (Signed)
CYSTOSCOPY  CC:  This is a 89 y.o. with recurrent UTI who presents today for cystoscopy.  POC urine: trace leukocytes, negative nitrites   BP (!) 151/80   Pulse 83   CYSTOSCOPY: A time out was performed.  The periurethral area was prepped and draped in a sterile manner.  2% lidocaine jetpack was inserted at the urethral meatus and the urethra and bladder visualized with 12- and 70-degree scopes.  She had normal urethral coaptation and normal urethral mucosa.  She had normal bladder mucosa. She had bilateral clear efflux from both ureteral orifices.  She had no squamous metaplasia at the trigone, no trabeculations, cellules or diverticuli.     ASSESSMENT:  89 y.o. with recurrent UTI. Cystoscopy today is normal.  PLAN:  - Continue with bactrim antibiotic prophylaxis daily and vaginal estrogen cream twice weekly.   Follow up 2 months. She was encouraged to call with any symptoms or questions/ concerns.   Marguerita Beards, MD

## 2023-06-30 NOTE — Patient Instructions (Addendum)
Taking Care of Yourself after Urodynamics, Cystoscopy, Bulkamid Injection, or Botox Injection   Drink plenty of water for a day or two following your procedure. Try to have about 8 ounces (one cup) at a time, and do this 6 times or more per day unless you have fluid restrictitons AVOID irritative beverages such as coffee, tea, soda, alcoholic or citrus drinks for a day or two, as this may cause burning with urination.  For the first 1-2 days after the procedure, your urine may be pink or red in color. You may have some blood in your urine as a normal side effect of the procedure. Large amounts of bleeding or difficulty urinating are NOT normal. Call the nurse line if this happens or go to the nearest Emergency Room if the bleeding is heavy or you cannot urinate at all and it is after hours. You may experience some discomfort or a burning sensation with urination after having this procedure. You can use over the counter Azo or pyridium to help with burning and follow the instructions on the packaging. If it does not improve within 1-2 days, or other symptoms appear (fever, chills, or difficulty urinating) call the office to speak to a nurse.  You may return to normal daily activities such as work, school, driving, exercising and housework on the day of the procedure. If your doctor gave you a prescription, take it as ordered.    Continue with vaginal estrogen cream twice a week to help prevent infections.   Continue with bactrim antibiotic daily to help prevent urinary tract infections.

## 2023-07-01 ENCOUNTER — Other Ambulatory Visit (HOSPITAL_COMMUNITY): Payer: Self-pay

## 2023-07-01 ENCOUNTER — Ambulatory Visit (INDEPENDENT_AMBULATORY_CARE_PROVIDER_SITE_OTHER): Payer: Medicare Other | Admitting: Gastroenterology

## 2023-07-01 ENCOUNTER — Encounter: Payer: Self-pay | Admitting: Gastroenterology

## 2023-07-01 VITALS — BP 112/64 | HR 82 | Ht 66.0 in | Wt 203.4 lb

## 2023-07-01 DIAGNOSIS — K5909 Other constipation: Secondary | ICD-10-CM

## 2023-07-01 DIAGNOSIS — R634 Abnormal weight loss: Secondary | ICD-10-CM | POA: Diagnosis not present

## 2023-07-01 DIAGNOSIS — R630 Anorexia: Secondary | ICD-10-CM | POA: Diagnosis not present

## 2023-07-01 DIAGNOSIS — C186 Malignant neoplasm of descending colon: Secondary | ICD-10-CM | POA: Diagnosis not present

## 2023-07-01 DIAGNOSIS — R1030 Lower abdominal pain, unspecified: Secondary | ICD-10-CM

## 2023-07-01 LAB — URINE CULTURE

## 2023-07-01 MED ORDER — NALOXEGOL OXALATE 25 MG PO TABS: 25.0000 mg | ORAL_TABLET | Freq: Every day | ORAL | 3 refills | Status: DC

## 2023-07-01 NOTE — Patient Instructions (Signed)
You have been scheduled for a colonoscopy. Please follow written instructions given to you at your visit today.   Please pick up your prep supplies at the pharmacy within the next 1-3 days.  If you use inhalers (even only as needed), please bring them with you on the day of your procedure.  DO NOT TAKE 7 DAYS PRIOR TO TEST- Trulicity (dulaglutide) Ozempic, Wegovy (semaglutide) Mounjaro (tirzepatide) Bydureon Bcise (exanatide extended release)  DO NOT TAKE 1 DAY PRIOR TO YOUR TEST Rybelsus (semaglutide) Adlyxin (lixisenatide) Victoza (liraglutide) Byetta (exanatide) ___________________________________________________________________________  Clenpiq sample given for 2 day prep  We have sent the following medications to your pharmacy for you to pick up at your convenience: Movantik  Due to recent changes in healthcare laws, you may see the results of your imaging and laboratory studies on MyChart before your provider has had a chance to review them.  We understand that in some cases there may be results that are confusing or concerning to you. Not all laboratory results come back in the same time frame and the provider may be waiting for multiple results in order to interpret others.  Please give [place] 48 hours in order for your provider to thoroughly review all the results before contacting the office for clarification of your results.    I appreciate the  opportunity to care for you  Thank You   [facility]

## 2023-07-01 NOTE — Progress Notes (Signed)
Chief Complaint: Recall colonoscopy Primary GI MD: Dr. Barron Alvine  HPI: 89 year old female history of stage IIIc colon cancer diagnosed 05/2020 s/p surgical resection and adjuvant therapy completed 11/2020 presents for colonoscopy recall  Last seen 05/2021 by Dr. Barron Alvine At that time she continued to struggle with constipation which had improved with Movantik and as needed use of senna, MiraLAX, and Colace.  Insurance would no longer cover Movantik so she was having refractory constipation.  She was recommended to go to pelvic floor physical therapy and continue senna and MiraLAX.  Also at this visit patient was recommended surveillance colonoscopy with 2-day prep  Colonoscopy January 2023 showed 3 tubular adenomas, tortuous colon, single nonbleeding angioectasia and patent anastomosis  The patient presents with weight loss and constipation for evaluation of gastrointestinal symptoms.  The patient has experienced a weight loss of approximately ten pounds over the past three months, accompanied by a lack of appetite and a general disinterest in food. Despite these changes, her weight has remained stable from the beginning of January to the present. She is concerned her cancer has returned. She would like a colonoscopy  She reports ongoing constipation, with regular treatments becoming ineffective over time. She has tried various medications including Miralax, Linzess, Trulance, and Amitiza, which provided only temporary relief. She resorts to using otc magnesium for relief when other methods fail. Bowel movements occur every three days, and she wants more regularity.  Abdominal pain is present, described as not new and having started after a hernia operation approximately two years ago. She confirms the presence of abdominal discomfort but denies any new symptoms related to her abdominal pain and constipation.  She uses a walker, which is not a new development.   Wt Readings from Last 3  Encounters:  07/01/23 203 lb 6 oz (92.3 kg)  06/23/23 202 lb (91.6 kg)  06/07/23 203 lb (92.1 kg)       PREVIOUS GI WORKUP     1) Colon cancer: Diagnosed during hospital admission in 05/2020 for hematochezia and acute blood loss anemia. 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  CEA 6.8.  MMR normal.   2) GERD: Index sxs of belching along with NCCP.  Has been present for over 20 years.  Has been evaluated in ER several times for these sxs with negative cardiac w/u. Has trialed pantoprazole, omeprazole, esomeprazole, Zantac all without any improvement in sxs. No dysphagia or odynophagia. Sxs occur 2-3 times/week. Tried bland diet diet with some improvement. Worse with coffee, chocolate, spicy foods, tomato based sauces.  pH study normal in 2020.   3) Constipation: Long standing history of constipation. Had trialed Trulance in 2020.  Otherwise, had been taking Linzess periodically for years.   Exacerbated by postoperative/malignancy pain medications and started on Movantik in 07/2020 with good relief, but insurance no longer would cover.   4) Elevated ALP: Ranges 121-156, but also in the setting of  pelvic fractures. Normal T bili.  5' nucleotidase elevated at 15 and elevated GGT at 123 in 10/2018.  MRCP 10/2018 with hepatic steatosis, stable lymphadenopathy, normal bile ducts.  Endoscopic history: - Colonoscopy (09/2015, [place], [place]): 2 subcentimeter tubular adenomas.  Recommended repeat in 5 years for ongoing surveillance. -EGD (03/2015, [place], [place]): Non-H. pylori gastritis, gastric polyp. - Esophageal Manometry (2020): Normal - pH/impedance (2020): Normal with JD score of 4 and  pH <4 was 0.8% (states that she avoided eating during the study due to gag reflex from probe in place) -Colonoscopy (05/2020): 6  mm sigmoid polyp, mass at splenic flexure/proximal descending colon (biopsy: Adenocarcinoma), sigmoid diverticulosis -EGD (05/2020): Benign fundic gland polyps otherwise normal -Colonoscopy (05/2021): Tortuous colon, diverticulosis, nonbleeding internal hemorrhoids, single nonbleeding colonic angioectasia, patent anastomosis, 3 tubular adenomas  Past Medical History:  Diagnosis Date   Arthritis    Cancer (HCC)    HX BREAST CANCER/ SKIN CANCER   Colon cancer (HCC)    Complication of anesthesia    N/V WITH MORPHINE   Difficulty sleeping    Diverticulosis    Fractured hip (HCC)    LEFT - AUG 2016   GERD (gastroesophageal reflux disease)    Hyperlipidemia    Hypertension    Melanoma (HCC)    Neuropathy    Nocturia    Osteopenia    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   PONV (postoperative nausea and vomiting)    PT STATES MORPHINE CAUSED N/V   Rosacea    Sleep apnea    Stage 1 breast cancer, ER+, right (HCC) 07/04/2017    Past Surgical History:  Procedure Laterality Date   28 HOUR PH STUDY N/A 06/21/2018   Procedure: 24 HOUR PH STUDY;  Surgeon: Shellia Cleverly, DO;  Location: [facility];  Service: Gastroenterology;  Laterality: N/A;  with impedance   ABDOMINAL HYSTERECTOMY  2010   ANAL RECTAL MANOMETRY N/A 09/19/2019   Procedure: ANO RECTAL MANOMETRY;  Surgeon: Napoleon Form, MD;  Location: [facility];  Service: Endoscopy;  Laterality: N/A;   APPENDECTOMY  1949   BACK SURGERY  1973   BIOPSY  06/10/2020   Procedure: BIOPSY;  Surgeon: Shellia Cleverly, DO;  Location: [facility];  Service: Gastroenterology;;  EGD and COLON   BREAST SURGERY     CATARACT EXTRACTION Bilateral 2011   CHOLECYSTECTOMY     COLON RESECTION N/A 06/12/2020   Procedure: LAPAROSCOPIC COLON RESECTION;  Surgeon: Abigail Miyamoto, MD;  Location: [facility];  Service: General;  Laterality: N/A;   COLONOSCOPY WITH PROPOFOL N/A 06/10/2020   Procedure: COLONOSCOPY WITH PROPOFOL;  Surgeon:  Shellia Cleverly, DO;  Location: [facility];  Service: Gastroenterology;  Laterality: N/A;   ESOPHAGEAL MANOMETRY N/A 06/21/2018   Procedure: ESOPHAGEAL MANOMETRY (EM);  Surgeon: Shellia Cleverly, DO;  Location: [facility];  Service: Gastroenterology;  Laterality: N/A;   ESOPHAGOGASTRODUODENOSCOPY (EGD) WITH PROPOFOL N/A 06/10/2020   Procedure: ESOPHAGOGASTRODUODENOSCOPY (EGD) WITH PROPOFOL;  Surgeon: Shellia Cleverly, DO;  Location: [facility];  Service: Gastroenterology;  Laterality: N/A;   GALLBLADDER SURGERY  2015   INCISIONAL HERNIA REPAIR N/A 09/16/2021   Procedure: OPEN INCISIONAL HERNIA REPAIR WITH MESH;  Surgeon: Abigail Miyamoto, MD;  Location: [facility];  Service: General;  Laterality: N/A;   JOINT REPLACEMENT     RT TOTAL HIP / RT TOTAL KNEE   MASTECTOMY  1998   BILATERAL    PH IMPEDANCE STUDY  06/21/2018   Procedure: PH IMPEDANCE STUDY;  Surgeon: Shellia Cleverly, DO;  Location: [facility];  Service: Gastroenterology;;   POLYPECTOMY  06/10/2020   Procedure: POLYPECTOMY;  Surgeon: Shellia Cleverly, DO;  Location: [facility];  Service: Gastroenterology;;   SKIN CANCER EXCISION  2016   RT SIDE OF NOSE   SUBMUCOSAL TATTOO INJECTION  06/10/2020   Procedure: SUBMUCOSAL TATTOO INJECTION;  Surgeon: Shellia Cleverly, DO;  Location: [facility];  Service: Gastroenterology;;   TONSILLECTOMY  [phone]  TOTAL HIP ARTHROPLASTY  2010   RIGHT   TOTAL HIP ARTHROPLASTY Left 05/09/2015   Procedure: LEFT TOTAL HIP ARTHROPLASTY ANTERIOR APPROACH;  Surgeon: Ollen Gross, MD;  Location: [facility];  Service: Orthopedics;  Laterality: Left;   TOTAL KNEE ARTHROPLASTY  2001   TUMOR REMOVAL  2012   ABDOMINAL - NON CANCEROUS    Current Outpatient Medications  Medication Sig Dispense Refill   albuterol (VENTOLIN HFA) 108 (90 Base) MCG/ACT inhaler Inhale 1-2 puffs into the lungs every 6 (six) hours as needed for up to 5 days for wheezing or shortness of breath. 8 g 0   aspirin EC 81 MG tablet  Take 81 mg by mouth daily. Swallow whole.     atorvastatin (LIPITOR) 20 MG tablet Take 1 tablet (20 mg total) by mouth daily. 90 tablet 3   bacitracin ophthalmic ointment Apply to eyelids at bedtime as needed. 3.5 g 6   estradiol (ESTRACE) 0.1 MG/GM vaginal cream Place 0.5 g vaginally 2 (two) times a week. Place 0.5g nightly for two weeks then twice a week after 42.5 g 11   HYDROcodone-acetaminophen (NORCO) 10-325 MG tablet Take 1 tablet by mouth every 6 (six) hours as needed. 120 tablet 0   ipratropium (ATROVENT) 0.06 % nasal spray Place 2 sprays into both nostrils 3 (three) times daily. 15 mL 5   losartan (COZAAR) 25 MG tablet Take 1 tablet (25 mg total) by mouth daily. 90 tablet 3   memantine (NAMENDA) 5 MG tablet Take 1 tablet (5 mg total) by mouth 2 (two) times daily. 180 tablet 3   Multiple Vitamins-Minerals (MULTIVITAMIN ADULTS) TABS Take 1 tablet by mouth daily.     ondansetron (ZOFRAN-ODT) 4 MG disintegrating tablet Take 1 tablet (4 mg total) by mouth every 8 (eight) hours as needed for nausea or vomiting. (Patient taking differently: Take 4 mg by mouth as needed for nausea or vomiting.) 30 tablet 1   sulfamethoxazole-trimethoprim (BACTRIM) 400-80 MG tablet Take 0.5 tablets by mouth daily. 90 tablet 0   valACYclovir (VALTREX) 1000 MG tablet Take 1 tablet (1,000 mg total) by mouth 2 (two) times daily. 60 tablet 2   vitamin B-12 (CYANOCOBALAMIN) 1000 MCG tablet Take 1,000 mcg by mouth daily.     cyclobenzaprine (FLEXERIL) 5 MG tablet Take 1 tablet (5 mg total) by mouth at bedtime as needed for muscle spasms. (Patient not taking: Reported on 07/01/2023) 30 tablet 1   Current Facility-Administered Medications  Medication Dose Route Frequency Provider Last Rate Last Admin   lidocaine (XYLOCAINE) 2 % jelly 1 Application  1 Application Urethral Once         Allergies as of 07/01/2023 - Review Complete 07/01/2023  Allergen Reaction Noted   Morphine Nausea And Vomiting 05/25/2022   Propoxyphene  Nausea And Vomiting, Palpitations, and Other (See Comments) 06/08/2020   Donepezil Diarrhea and Other (See Comments) 10/22/2021   Morphine and codeine Nausea And Vomiting 06/18/2017   Tape Rash and Other (See Comments) 06/06/2019    Family History  Problem Relation Age of Onset   Other Mother        complications from flu   Other Father        unsure of cause   Colon cancer Neg Hx     Social History   Socioeconomic History   Marital status: Widowed    Spouse name: Not on file   Number of children: 3   Years of education: 16 years   Highest education level: Not on file  Occupational History   Occupation: Retired  Tobacco Use   Smoking status: Former    Current packs/day: 0.00    Average packs/day: 0.5 packs/day for 20.0 years (10.0 ttl pk-yrs)    Types: Cigarettes    Start date: 02/03/1956    Quit date: 02/03/1976    Years since quitting: 47.4   Smokeless tobacco: Never  Vaping Use   Vaping status: Never Used  Substance and Sexual Activity   Alcohol use: No   Drug use: No   Sexual activity: Not Currently    Birth control/protection: Post-menopausal  Other Topics Concern   Not on file  Social History Narrative   Lives at home with husband.   Right-handed.   No caffeine use.   retired   Teacher, early years/pre Strain: Low Risk  (08/07/2021)   Overall Financial Resource Strain (CARDIA)    Difficulty of Paying Living Expenses: Not hard at all  Food Insecurity: No Food Insecurity (08/31/2022)   Hunger Vital Sign    Worried About Running Out of Food in the Last Year: Never true    Ran Out of Food in the Last Year: Never true  Transportation Needs: No Transportation Needs (08/31/2022)   PRAPARE - Administrator, Civil Service (Medical): No    Lack of Transportation (Non-Medical): No  Physical Activity: Inactive (08/31/2022)   Exercise Vital Sign    Days of Exercise per Week: 0 days    Minutes of Exercise per Session: 0 min  Stress: No  Stress Concern Present (08/31/2022)   [name] of Occupational Health - Occupational Stress Questionnaire    Feeling of Stress : Not at all  Social Connections: Moderately Isolated (08/07/2021)   Social Connection and Isolation Panel [NHANES]    Frequency of Communication with Friends and Family: More than three times a week    Frequency of Social Gatherings with Friends and Family: More than three times a week    Attends Religious Services: More than 4 times per year    Active Member of [employer] or Organizations: No    Attends Banker Meetings: Never    Marital Status: Widowed  Intimate Partner Violence: Not At Risk (08/31/2022)   Humiliation, Afraid, Rape, and Kick questionnaire    Fear of Current or Ex-Partner: No    Emotionally Abused: No    Physically Abused: No    Sexually Abused: No    Review of Systems:    Constitutional: No weight loss, fever, chills, weakness or fatigue HEENT: Eyes: No change in vision               Ears, Nose, Throat:  No change in hearing or congestion Skin: No rash or itching Cardiovascular: No chest pain, chest pressure or palpitations   Respiratory: No SOB or cough Gastrointestinal: See HPI and otherwise negative Genitourinary: No dysuria or change in urinary frequency Neurological: No headache, dizziness or syncope Musculoskeletal: No new muscle or joint pain Hematologic: No bleeding or bruising Psychiatric: No history of depression or anxiety    Physical Exam:  Vital signs: BP 112/64   Pulse 82   Ht 5\' 6"  (1.676 m)   Wt 203 lb 6 oz (92.3 kg)   SpO2 96%   BMI 32.83 kg/m   Constitutional: NAD, Well developed, Well nourished, alert and cooperative. Appears younger than stated age. Does have a walker. Head:  Normocephalic and atraumatic. Eyes:   PEERL, EOMI. No icterus. Conjunctiva pink. Respiratory: Respirations even  and unlabored. Lungs clear to auscultation bilaterally.   No wheezes, crackles, or rhonchi.  Cardiovascular:   Regular rate and rhythm. No peripheral edema, cyanosis or pallor.  Gastrointestinal:  Soft, nondistended, nontender. No rebound or guarding. hypoactive bowel sounds. No appreciable masses or hepatomegaly. Rectal:  Not performed.  Msk:  Symmetrical without gross deformities. Without edema, no deformity or joint abnormality.  Neurologic:  Alert and  oriented x4;  grossly normal neurologically.  Skin:   Dry and intact without significant lesions or rashes. Psychiatric: Oriented to person, place and time. Demonstrates good judgement and reason without abnormal affect or behaviors.   RELEVANT LABS AND IMAGING: CBC    Component Value Date/Time   WBC 6.3 06/23/2023 1227   WBC 9.1 05/23/2023 0934   RBC 4.57 06/23/2023 1227   RBC 4.54 06/23/2023 1227   HGB 13.7 06/23/2023 1227   HGB 13.6 02/09/2017 1039   HCT 41.7 06/23/2023 1227   HCT 41.5 02/09/2017 1039   PLT 130 (L) 06/23/2023 1227   PLT 172 02/09/2017 1039   MCV 91.2 06/23/2023 1227   MCV 90 02/09/2017 1039   MCH 30.0 06/23/2023 1227   MCHC 32.9 06/23/2023 1227   RDW 13.9 06/23/2023 1227   RDW 14.4 02/09/2017 1039   LYMPHSABS 2.3 06/23/2023 1227   LYMPHSABS 1.9 02/09/2017 1039   MONOABS 0.5 06/23/2023 1227   EOSABS 0.4 06/23/2023 1227   EOSABS 0.2 02/09/2017 1039   BASOSABS 0.0 06/23/2023 1227   BASOSABS 0.0 02/09/2017 1039    CMP     Component Value Date/Time   NA 139 06/23/2023 1227   NA 139 02/25/2020 1627   K 4.4 06/23/2023 1227   CL 105 06/23/2023 1227   CO2 27 06/23/2023 1227   GLUCOSE 114 (H) 06/23/2023 1227   BUN 12 06/23/2023 1227   BUN 11 02/25/2020 1627   CREATININE 0.56 06/23/2023 1227   CALCIUM 9.6 06/23/2023 1227   PROT 7.1 06/23/2023 1227   PROT 6.8 02/09/2017 1039   ALBUMIN 4.3 06/23/2023 1227   ALBUMIN 4.1 02/09/2017 1039   AST 18 06/23/2023 1227   ALT 12 06/23/2023 1227   ALKPHOS 60 06/23/2023 1227   BILITOT 0.6 06/23/2023 1227   GFRNONAA >60 06/23/2023 1227   GFRAA 93 02/25/2020 1627   GFRAA  >60 12/05/2019 1251     Assessment/Plan:   Colon cancer Stage IIIc colon cancer diagnosed 05/2020 s/p surgical resection and then adjuvant therapy completed 11/2020 following with Dr. Myna Hidalgo.  Last colonoscopy January 2023 with 3 small tubular adenomas and a recall of 3 years. - Schedule colonoscopy - I thoroughly discussed the procedure with the patient (at bedside) to include nature of the procedure, alternatives, benefits, and risks (including but not limited to bleeding, infection, perforation, anesthesia/cardiac pulmonary complications).  Patient verbalized understanding and gave verbal consent to proceed with procedure. - 2-day prep  Chronic constipation Has tried extensive medications in the past including Trulance, MiraLAX, Amitiza, Linzess without relief.  Movantik previously worked well but was no longer covered by her insurance.  Upon further research it appears her insurance may have better coverage now with it being $38 per month. - Movantik 25 Mg once daily 30 days - Continue senna and MiraLAX as needed - Continue adequate hydration  Weight loss Loss of appetite Reportedly lost 10 pounds over the course of 3 months due to loss of appetite. - Continue to monitor - Proceed with colonoscopy  Lower abdominal pain Reported since her hernia surgery 08/2021.  Though possible  multifactorial with her history of constipation - Provided IBgard samples - Optimize constipation regimen   Lara Mulch [facility] 07/01/2023, 10:28 AM  Cc: Pearline Cables, MD

## 2023-07-04 ENCOUNTER — Encounter: Payer: Self-pay | Admitting: Gastroenterology

## 2023-07-04 ENCOUNTER — Ambulatory Visit (AMBULATORY_SURGERY_CENTER): Payer: Medicare Other | Admitting: Gastroenterology

## 2023-07-04 VITALS — BP 166/61 | HR 70 | Temp 98.2°F | Resp 13 | Ht 66.0 in | Wt 203.6 lb

## 2023-07-04 DIAGNOSIS — G4730 Sleep apnea, unspecified: Secondary | ICD-10-CM | POA: Diagnosis not present

## 2023-07-04 DIAGNOSIS — Z860101 Personal history of adenomatous and serrated colon polyps: Secondary | ICD-10-CM | POA: Diagnosis not present

## 2023-07-04 DIAGNOSIS — Z1211 Encounter for screening for malignant neoplasm of colon: Secondary | ICD-10-CM | POA: Diagnosis not present

## 2023-07-04 DIAGNOSIS — E785 Hyperlipidemia, unspecified: Secondary | ICD-10-CM | POA: Diagnosis not present

## 2023-07-04 DIAGNOSIS — K6389 Other specified diseases of intestine: Secondary | ICD-10-CM

## 2023-07-04 DIAGNOSIS — C186 Malignant neoplasm of descending colon: Secondary | ICD-10-CM

## 2023-07-04 DIAGNOSIS — Z980 Intestinal bypass and anastomosis status: Secondary | ICD-10-CM

## 2023-07-04 DIAGNOSIS — D120 Benign neoplasm of cecum: Secondary | ICD-10-CM | POA: Diagnosis not present

## 2023-07-04 DIAGNOSIS — D123 Benign neoplasm of transverse colon: Secondary | ICD-10-CM

## 2023-07-04 DIAGNOSIS — I10 Essential (primary) hypertension: Secondary | ICD-10-CM | POA: Diagnosis not present

## 2023-07-04 DIAGNOSIS — Z85038 Personal history of other malignant neoplasm of large intestine: Secondary | ICD-10-CM | POA: Diagnosis not present

## 2023-07-04 DIAGNOSIS — Q439 Congenital malformation of intestine, unspecified: Secondary | ICD-10-CM | POA: Diagnosis not present

## 2023-07-04 DIAGNOSIS — K5730 Diverticulosis of large intestine without perforation or abscess without bleeding: Secondary | ICD-10-CM | POA: Diagnosis not present

## 2023-07-04 DIAGNOSIS — K5909 Other constipation: Secondary | ICD-10-CM

## 2023-07-04 DIAGNOSIS — D122 Benign neoplasm of ascending colon: Secondary | ICD-10-CM | POA: Diagnosis not present

## 2023-07-04 MED ORDER — SODIUM CHLORIDE 0.9 % IV SOLN: 500.0000 mL | Freq: Once | INTRAVENOUS | Status: DC

## 2023-07-04 NOTE — Patient Instructions (Signed)
YOU HAD AN ENDOSCOPIC PROCEDURE TODAY AT [facility]:   Refer to the procedure report that was given to you for any specific questions about what was found during the examination.  If the procedure report does not answer your questions, please call your gastroenterologist to clarify.  If you requested that your care partner not be given the details of your procedure findings, then the procedure report has been included in a sealed envelope for you to review at your convenience later.  **Handouts given on polyps and diverticulosis**  YOU SHOULD EXPECT: Some feelings of bloating in the abdomen. Passage of more gas than usual.  Walking can help get rid of the air that was put into your GI tract during the procedure and reduce the bloating. If you had a lower endoscopy (such as a colonoscopy or flexible sigmoidoscopy) you may notice spotting of blood in your stool or on the toilet paper. If you underwent a bowel prep for your procedure, you may not have a normal bowel movement for a few days.  Please Note:  You might notice some irritation and congestion in your nose or some drainage.  This is from the oxygen used during your procedure.  There is no need for concern and it should clear up in a day or so.  SYMPTOMS TO REPORT IMMEDIATELY:  Following lower endoscopy (colonoscopy or flexible sigmoidoscopy):  Excessive amounts of blood in the stool  Significant tenderness or worsening of abdominal pains  Swelling of the abdomen that is new, acute  Fever of 100F or higher  For urgent or emergent issues, a gastroenterologist can be reached at any hour by calling [phone]. Do not use MyChart messaging for urgent concerns.    DIET:  We do recommend a small meal at first, but then you may proceed to your regular diet.  Drink plenty of fluids but you should avoid alcoholic beverages for 24 hours.  ACTIVITY:  You should plan to take it easy for the rest of today and you should NOT DRIVE  or use heavy machinery until tomorrow (because of the sedation medicines used during the test).    FOLLOW UP: Our staff will call the number listed on your records the next business day following your procedure.  We will call around 7:15- 8:00 am to check on you and address any questions or concerns that you may have regarding the information given to you following your procedure. If we do not reach you, we will leave a message.     If any biopsies were taken you will be contacted by phone or by letter within the next 1-3 weeks.  Please call us at [phone] if you have not heard about the biopsies in 3 weeks.    SIGNATURES/CONFIDENTIALITY: You and/or your care partner have signed paperwork which will be entered into your electronic medical record.  These signatures attest to the fact that that the information above on your After Visit Summary has been reviewed and is understood.  Full responsibility of the confidentiality of this discharge information lies with you and/or your care-partner.

## 2023-07-04 NOTE — Progress Notes (Signed)
 Agree with the assessment and plan as outlined by Boone Master, PA-C.  Nimrat Woolworth, DO, [facility]

## 2023-07-04 NOTE — Progress Notes (Unsigned)
GASTROENTEROLOGY PROCEDURE H&P NOTE   Primary Care Physician: Pearline Cables, MD    Reason for Procedure:  Colon cancer surveillance, weight loss  Plan:    Colonoscopy  Patient is appropriate for endoscopic procedure(s) in the ambulatory ([facility]) setting.  The nature of the procedure, as well as the risks, benefits, and alternatives were carefully and thoroughly reviewed with the patient. Ample time for discussion and questions allowed. The patient understood, was satisfied, and agreed to proceed.     HPI: Crystal Lee is a 89 y.o. female who presents for colonoscopy for ongoing colon cancer surveillance and evaluation of weight loss.  Does have chronic constipation.   No interval change in history since last OV on 07/01/2023.    Stage IIIc colon cancer diagnosed 05/2020 s/p surgical resection and then adjuvant therapy completed 11/2020   Last Colonoscopy was January 2023 showed 3 tubular adenomas, tortuous colon, single nonbleeding angioectasia and patent anastomosis   Past Medical History:  Diagnosis Date   Arthritis    Cancer (HCC)    HX BREAST CANCER/ SKIN CANCER   Colon cancer (HCC)    Complication of anesthesia    N/V WITH MORPHINE   Difficulty sleeping    Diverticulosis    Fractured hip (HCC)    LEFT - AUG 2016   GERD (gastroesophageal reflux disease)    Hyperlipidemia    Hypertension    Melanoma (HCC)    Neuropathy    Nocturia    Osteopenia    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   PONV (postoperative nausea and vomiting)    PT STATES MORPHINE CAUSED N/V   Rosacea    Sleep apnea    Stage 1 breast cancer, ER+, right (HCC) 07/04/2017    Past Surgical History:  Procedure Laterality Date   76 HOUR PH STUDY N/A 06/21/2018   Procedure: 24 HOUR PH STUDY;  Surgeon: Shellia Cleverly, DO;  Location: [facility];  Service: Gastroenterology;  Laterality: N/A;  with impedance   ABDOMINAL HYSTERECTOMY  2010   ANAL RECTAL MANOMETRY N/A 09/19/2019    Procedure: ANO RECTAL MANOMETRY;  Surgeon: Napoleon Form, MD;  Location: [facility];  Service: Endoscopy;  Laterality: N/A;   APPENDECTOMY  1949   BACK SURGERY  1973   BIOPSY  06/10/2020   Procedure: BIOPSY;  Surgeon: Shellia Cleverly, DO;  Location: [facility];  Service: Gastroenterology;;  EGD and COLON   BREAST SURGERY     CATARACT EXTRACTION Bilateral 2011   CHOLECYSTECTOMY     COLON RESECTION N/A 06/12/2020   Procedure: LAPAROSCOPIC COLON RESECTION;  Surgeon: Abigail Miyamoto, MD;  Location: [facility];  Service: General;  Laterality: N/A;   COLONOSCOPY WITH PROPOFOL N/A 06/10/2020   Procedure: COLONOSCOPY WITH PROPOFOL;  Surgeon: Shellia Cleverly, DO;  Location: [facility];  Service: Gastroenterology;  Laterality: N/A;   ESOPHAGEAL MANOMETRY N/A 06/21/2018   Procedure: ESOPHAGEAL MANOMETRY (EM);  Surgeon: Shellia Cleverly, DO;  Location: [facility];  Service: Gastroenterology;  Laterality: N/A;   ESOPHAGOGASTRODUODENOSCOPY (EGD) WITH PROPOFOL N/A 06/10/2020   Procedure: ESOPHAGOGASTRODUODENOSCOPY (EGD) WITH PROPOFOL;  Surgeon: Shellia Cleverly, DO;  Location: [facility];  Service: Gastroenterology;  Laterality: N/A;   GALLBLADDER SURGERY  2015   INCISIONAL HERNIA REPAIR N/A 09/16/2021   Procedure: OPEN INCISIONAL HERNIA REPAIR WITH MESH;  Surgeon: Abigail Miyamoto, MD;  Location: [facility];  Service: General;  Laterality: N/A;   JOINT REPLACEMENT     RT TOTAL HIP / RT TOTAL KNEE  MASTECTOMY  1998   BILATERAL    PH IMPEDANCE STUDY  06/21/2018   Procedure: PH IMPEDANCE STUDY;  Surgeon: Shellia Cleverly, DO;  Location: [facility];  Service: Gastroenterology;;   POLYPECTOMY  06/10/2020   Procedure: POLYPECTOMY;  Surgeon: Shellia Cleverly, DO;  Location: [facility];  Service: Gastroenterology;;   SKIN CANCER EXCISION  2016   RT SIDE OF NOSE   SUBMUCOSAL TATTOO INJECTION  06/10/2020   Procedure: SUBMUCOSAL TATTOO INJECTION;  Surgeon: Shellia Cleverly, DO;   Location: [facility];  Service: Gastroenterology;;   TONSILLECTOMY  1953   TOTAL HIP ARTHROPLASTY  2010   RIGHT   TOTAL HIP ARTHROPLASTY Left 05/09/2015   Procedure: LEFT TOTAL HIP ARTHROPLASTY ANTERIOR APPROACH;  Surgeon: Ollen Gross, MD;  Location: [facility];  Service: Orthopedics;  Laterality: Left;   TOTAL KNEE ARTHROPLASTY  2001   TUMOR REMOVAL  2012   ABDOMINAL - NON CANCEROUS    Prior to Admission medications   Medication Sig Start Date End Date Taking? Authorizing Provider  aspirin EC 81 MG tablet Take 81 mg by mouth daily. Swallow whole.   Yes [provider]  atorvastatin (LIPITOR) 20 MG tablet Take 1 tablet (20 mg total) by mouth daily. 09/06/22  Yes Copland, Gwenlyn Found, MD  bacitracin ophthalmic ointment Apply to eyelids at bedtime as needed. 09/06/22  Yes Copland, Gwenlyn Found, MD  estradiol (ESTRACE) 0.1 MG/GM vaginal cream Place 0.5 g vaginally 2 (two) times a week. Place 0.5g nightly for two weeks then twice a week after 06/13/23  Yes Zuleta, Joan Mayans, NP  HYDROcodone-acetaminophen (NORCO) 10-325 MG tablet Take 1 tablet by mouth every 6 (six) hours as needed. 06/07/23  Yes Jones Bales, NP  losartan (COZAAR) 25 MG tablet Take 1 tablet (25 mg total) by mouth daily. 09/06/22  Yes Copland, Gwenlyn Found, MD  sulfamethoxazole-trimethoprim (BACTRIM) 400-80 MG tablet Take 0.5 tablets by mouth daily. 06/13/23  Yes Selmer Dominion, NP  valACYclovir (VALTREX) 1000 MG tablet Take 1 tablet (1,000 mg total) by mouth 2 (two) times daily. 02/24/22  Yes   vitamin B-12 (CYANOCOBALAMIN) 1000 MCG tablet Take 1,000 mcg by mouth daily.   Yes [provider]  albuterol (VENTOLIN HFA) 108 (90 Base) MCG/ACT inhaler Inhale 1-2 puffs into the lungs every 6 (six) hours as needed for up to 5 days for wheezing or shortness of breath. 01/14/23 07/01/23  Gowens, Mariah L, PA-C  cyclobenzaprine (FLEXERIL) 5 MG tablet Take 1 tablet (5 mg total) by mouth at bedtime as needed for muscle spasms. Patient not  taking: Reported on 07/01/2023 09/07/22   Jones Bales, NP  ipratropium (ATROVENT) 0.06 % nasal spray Place 2 sprays into both nostrils 3 (three) times daily. 06/17/22     memantine (NAMENDA) 5 MG tablet Take 1 tablet (5 mg total) by mouth 2 (two) times daily. Patient not taking: Reported on 07/04/2023 01/24/23   Marcos Eke, PA-C  Multiple Vitamins-Minerals (MULTIVITAMIN ADULTS) TABS Take 1 tablet by mouth daily. Patient not taking: Reported on 07/04/2023    [provider]  naloxegol oxalate (MOVANTIK) 25 MG TABS tablet Take 1 tablet (25 mg total) by mouth daily. Patient not taking: Reported on 07/04/2023 07/01/23   Legrand Como, PA-C  ondansetron (ZOFRAN-ODT) 4 MG disintegrating tablet Take 1 tablet (4 mg total) by mouth every 8 (eight) hours as needed for nausea or vomiting. Patient taking differently: Take 4 mg by mouth as needed for nausea or vomiting. 05/30/23  [name] F, FNP    Current Outpatient Medications  Medication Sig Dispense Refill   aspirin EC 81 MG tablet Take 81 mg by mouth daily. Swallow whole.     atorvastatin (LIPITOR) 20 MG tablet Take 1 tablet (20 mg total) by mouth daily. 90 tablet 3   bacitracin ophthalmic ointment Apply to eyelids at bedtime as needed. 3.5 g 6   estradiol (ESTRACE) 0.1 MG/GM vaginal cream Place 0.5 g vaginally 2 (two) times a week. Place 0.5g nightly for two weeks then twice a week after 42.5 g 11   HYDROcodone-acetaminophen (NORCO) 10-325 MG tablet Take 1 tablet by mouth every 6 (six) hours as needed. 120 tablet 0   losartan (COZAAR) 25 MG tablet Take 1 tablet (25 mg total) by mouth daily. 90 tablet 3   sulfamethoxazole-trimethoprim (BACTRIM) 400-80 MG tablet Take 0.5 tablets by mouth daily. 90 tablet 0   valACYclovir (VALTREX) 1000 MG tablet Take 1 tablet (1,000 mg total) by mouth 2 (two) times daily. 60 tablet 2   vitamin B-12 (CYANOCOBALAMIN) 1000 MCG tablet Take 1,000 mcg by mouth daily.     albuterol (VENTOLIN HFA) 108 (90  Base) MCG/ACT inhaler Inhale 1-2 puffs into the lungs every 6 (six) hours as needed for up to 5 days for wheezing or shortness of breath. 8 g 0   cyclobenzaprine (FLEXERIL) 5 MG tablet Take 1 tablet (5 mg total) by mouth at bedtime as needed for muscle spasms. (Patient not taking: Reported on 07/01/2023) 30 tablet 1   ipratropium (ATROVENT) 0.06 % nasal spray Place 2 sprays into both nostrils 3 (three) times daily. 15 mL 5   memantine (NAMENDA) 5 MG tablet Take 1 tablet (5 mg total) by mouth 2 (two) times daily. (Patient not taking: Reported on 07/04/2023) 180 tablet 3   Multiple Vitamins-Minerals (MULTIVITAMIN ADULTS) TABS Take 1 tablet by mouth daily. (Patient not taking: Reported on 07/04/2023)     naloxegol oxalate (MOVANTIK) 25 MG TABS tablet Take 1 tablet (25 mg total) by mouth daily. (Patient not taking: Reported on 07/04/2023) 60 tablet 3   ondansetron (ZOFRAN-ODT) 4 MG disintegrating tablet Take 1 tablet (4 mg total) by mouth every 8 (eight) hours as needed for nausea or vomiting. (Patient taking differently: Take 4 mg by mouth as needed for nausea or vomiting.) 30 tablet 1   Current Facility-Administered Medications  Medication Dose Route Frequency Provider Last Rate Last Admin   0.9 %  sodium chloride infusion  500 mL Intravenous Once Cherish Runde V, DO       lidocaine (XYLOCAINE) 2 % jelly 1 Application  1 Application Urethral Once         Allergies as of 07/04/2023 - Review Complete 07/04/2023  Allergen Reaction Noted   Morphine Nausea And Vomiting 05/25/2022   Propoxyphene Nausea And Vomiting, Palpitations, and Other (See Comments) 06/08/2020   Donepezil Diarrhea and Other (See Comments) 10/22/2021   Tape Rash and Other (See Comments) 06/06/2019    Family History  Problem Relation Age of Onset   Other Mother        complications from flu   Other Father        unsure of cause   Colon cancer Neg Hx     Social History   Socioeconomic History   Marital status: Widowed     Spouse name: Not on file   Number of children: 3   Years of education: 16 years   Highest education level: Not on file  Occupational History  Occupation: Retired  Tobacco Use   Smoking status: Former    Current packs/day: 0.00    Average packs/day: 0.5 packs/day for 20.0 years (10.0 ttl pk-yrs)    Types: Cigarettes    Start date: 02/03/1956    Quit date: 02/03/1976    Years since quitting: 47.4   Smokeless tobacco: Never  Vaping Use   Vaping status: Never Used  Substance and Sexual Activity   Alcohol use: No   Drug use: No   Sexual activity: Not Currently    Birth control/protection: Post-menopausal  Other Topics Concern   Not on file  Social History Narrative   Lives at home with husband.   Right-handed.   No caffeine use.   retired   Teacher, early years/pre Strain: Low Risk  (08/07/2021)   Overall Financial Resource Strain (CARDIA)    Difficulty of Paying Living Expenses: Not hard at all  Food Insecurity: No Food Insecurity (08/31/2022)   Hunger Vital Sign    Worried About Running Out of Food in the Last Year: Never true    Ran Out of Food in the Last Year: Never true  Transportation Needs: No Transportation Needs (08/31/2022)   PRAPARE - Administrator, Civil Service (Medical): No    Lack of Transportation (Non-Medical): No  Physical Activity: Inactive (08/31/2022)   Exercise Vital Sign    Days of Exercise per Week: 0 days    Minutes of Exercise per Session: 0 min  Stress: No Stress Concern Present (08/31/2022)   [name] of Occupational Health - Occupational Stress Questionnaire    Feeling of Stress : Not at all  Social Connections: Moderately Isolated (08/07/2021)   Social Connection and Isolation Panel [NHANES]    Frequency of Communication with Friends and Family: More than three times a week    Frequency of Social Gatherings with Friends and Family: More than three times a week    Attends Religious Services: More than 4 times  per year    Active Member of [employer] or Organizations: No    Attends Banker Meetings: Never    Marital Status: Widowed  Intimate Partner Violence: Not At Risk (08/31/2022)   Humiliation, Afraid, Rape, and Kick questionnaire    Fear of Current or Ex-Partner: No    Emotionally Abused: No    Physically Abused: No    Sexually Abused: No    Physical Exam: Vital signs in last 24 hours: @BP  (!) 159/76   Pulse 78   Temp 98.2 F (36.8 C)   Ht 5\' 6"  (1.676 m)   Wt 203 lb 9.6 oz (92.4 kg)   SpO2 97%   BMI 32.86 kg/m  GEN: NAD EYE: Sclerae anicteric ENT: MMM CV: Non-tachycardic Pulm: CTA b/l GI: Soft, NT/ND NEURO:  Alert & Oriented x 3   Doristine Locks, DO [facility]   07/04/2023 11:16 AM

## 2023-07-04 NOTE — Op Note (Signed)
[facility] Patient Name: Crystal Lee Procedure Date: 07/04/2023 11:16 AM MRN: [id number] Endoscopist: Doristine Locks , MD, [id number] Age: 89 Referring MD:  Date of Birth: May 02, 1934 Gender: Female Account #: [id number] Procedure:                Colonoscopy Indications:              High risk colon cancer surveillance: Personal                            history of adenoma less than 10 mm in size, High                            risk colon cancer surveillance: Personal history of                            colon cancer                           89 yo female with a history of Stage IIIc colon                            cancer diagnosed 05/2020 s/p surgical resection and                            then adjuvant therapy completed 11/2020                           Last Colonoscopy was January 2023 showed 3 tubular                            adenomas, tortuous colon, single nonbleeding                            angioectasia and patent anastomosis Medicines:                Monitored Anesthesia Care Procedure:                Pre-Anesthesia Assessment:                           - Prior to the procedure, a History and Physical                            was performed, and patient medications and                            allergies were reviewed. The patient's tolerance of                            previous anesthesia was also reviewed. The risks                            and benefits of the procedure and the sedation  options and risks were discussed with the patient.                            All questions were answered, and informed consent                            was obtained. Prior Anticoagulants: The patient has                            taken no anticoagulant or antiplatelet agents. ASA                            Grade Assessment: III - A patient with severe                            systemic disease. After reviewing the risks and                             benefits, the patient was deemed in satisfactory                            condition to undergo the procedure.                           After obtaining informed consent, the colonoscope                            was passed under direct vision. Throughout the                            procedure, the patient's blood pressure, pulse, and                            oxygen saturations were monitored continuously. The                            Olympus CF-HQ190L ([id number]) Colonoscope was                            introduced through the anus and advanced to the the                            cecum, identified by appendiceal orifice and                            ileocecal valve. The colonoscopy was technically                            difficult and complex due to significant looping                            and a tortuous colon. Successful completion of the  procedure was aided by applying abdominal pressure.                            The patient tolerated the procedure well. The                            quality of the bowel preparation was good. The                            ileocecal valve, appendiceal orifice, and rectum                            were photographed. Scope In: 11:25:13 AM Scope Out: 11:55:18 AM Scope Withdrawal Time: 0 hours 19 minutes 41 seconds  Total Procedure Duration: 0 hours 30 minutes 5 seconds  Findings:                 The perianal and digital rectal examinations were                            normal.                           Five sessile polyps were found in the ascending                            colon and cecum. The polyps were 3 to 5 mm in size.                            These polyps were removed with a cold snare.                            Resection and retrieval were complete. Estimated                            blood loss was minimal.                           A 3 mm polyp was found in the transverse  colon. The                            polyp was sessile. The polyp was removed with a                            cold snare. Resection and retrieval were complete.                            Estimated blood loss was minimal.                           There was evidence of a prior end-to-end                            colo-colonic anastomosis in the distal transverse  colon. This was patent and easily traversed. There                            was a localized area of mildly edematous mucosa at                            the anastamosis. Biopsies were taken with a cold                            forceps for histology. Estimated blood loss was                            minimal.                           Multiple medium-mouthed and small-mouthed                            diverticula were found in the sigmoid colon.                           The retroflexed view of the distal rectum and anal                            verge was normal and showed no anal or rectal                            abnormalities.                           The sigmoid colon was significantly tortuous.                            Advancing the scope required using manual pressure                            and straightening and shortening the scope to                            obtain bowel loop reduction. Complications:            No immediate complications. Estimated Blood Loss:     Estimated blood loss was minimal. Impression:               - Five 3 to 5 mm polyps in the ascending colon and                            in the cecum, removed with a cold snare. Resected                            and retrieved.                           - One 3 mm polyp in the transverse colon, removed  with a cold snare. Resected and retrieved.                           - Patent end-to-end colo-colonic anastomosis,                            characterized by a localized area of mild  edema.                            Biopsied.                           - Diverticulosis in the sigmoid colon.                           - The distal rectum and anal verge are normal on                            retroflexion view.                           - Tortuous colon. Recommendation:           - Patient has a contact number available for                            emergencies. The signs and symptoms of potential                            delayed complications were discussed with the                            patient. Return to normal activities tomorrow.                            Written discharge instructions were provided to the                            patient.                           - Resume previous diet.                           - Continue present medications.                           - Await pathology results.                           - Repeat colonoscopy for surveillance based on                            pathology results. Doristine Locks, MD 07/04/2023 12:04:28 PM

## 2023-07-04 NOTE — Progress Notes (Unsigned)
 Report to PACU, RN, vss, BBS= Clear.

## 2023-07-04 NOTE — Progress Notes (Unsigned)
 Pt's states no medical or surgical changes since previsit or office visit.

## 2023-07-05 ENCOUNTER — Telehealth: Payer: Self-pay

## 2023-07-05 NOTE — Telephone Encounter (Signed)
 Left message on follow up call.

## 2023-07-06 ENCOUNTER — Encounter: Payer: Self-pay | Admitting: Hematology & Oncology

## 2023-07-06 ENCOUNTER — Encounter: Payer: Medicare Other | Attending: Physical Medicine & Rehabilitation | Admitting: Registered Nurse

## 2023-07-06 ENCOUNTER — Telehealth: Payer: Self-pay | Admitting: Pharmacy Technician

## 2023-07-06 ENCOUNTER — Other Ambulatory Visit (HOSPITAL_COMMUNITY): Payer: Self-pay

## 2023-07-06 VITALS — BP 151/77 | HR 68 | Ht 66.0 in | Wt 204.0 lb

## 2023-07-06 DIAGNOSIS — R202 Paresthesia of skin: Secondary | ICD-10-CM | POA: Diagnosis not present

## 2023-07-06 DIAGNOSIS — M170 Bilateral primary osteoarthritis of knee: Secondary | ICD-10-CM | POA: Diagnosis not present

## 2023-07-06 DIAGNOSIS — Z5181 Encounter for therapeutic drug level monitoring: Secondary | ICD-10-CM | POA: Insufficient documentation

## 2023-07-06 DIAGNOSIS — G894 Chronic pain syndrome: Secondary | ICD-10-CM | POA: Diagnosis not present

## 2023-07-06 DIAGNOSIS — G8929 Other chronic pain: Secondary | ICD-10-CM | POA: Diagnosis not present

## 2023-07-06 DIAGNOSIS — M545 Low back pain, unspecified: Secondary | ICD-10-CM | POA: Insufficient documentation

## 2023-07-06 DIAGNOSIS — M2550 Pain in unspecified joint: Secondary | ICD-10-CM | POA: Insufficient documentation

## 2023-07-06 DIAGNOSIS — Z79891 Long term (current) use of opiate analgesic: Secondary | ICD-10-CM | POA: Insufficient documentation

## 2023-07-06 LAB — SURGICAL PATHOLOGY

## 2023-07-06 MED ORDER — HYDROCODONE-ACETAMINOPHEN 10-325 MG PO TABS: 1.0000 | ORAL_TABLET | Freq: Four times a day (QID) | ORAL | 0 refills | Status: DC | PRN

## 2023-07-06 NOTE — Telephone Encounter (Signed)
 Pharmacy Patient Advocate Encounter   Received notification from Fax that prior authorization for MOVANTIK  25MG  is required/requested.   Insurance verification completed.   The patient is insured through [employer] .

## 2023-07-06 NOTE — Progress Notes (Signed)
 Subjective:    Patient ID: Crystal Lee, female    DOB: June 06, 1933, 89 y.o.   MRN: [id number]  HPI: Crystal Lee is a 89 y.o. female who returns for follow up appointment for chronic pain and medication refill. She states her pain is located in her lower back, bilateral lower extremities with tingling, bilateral hips  and bilateral knee pain L>R. She also reports bilateral feet pain. She rates her pain 5. Her current exercise regime is walking and performing stretching exercises.  [name] Morphine  equivalent is 40.00 MME.   Last Oral Swab was Performed on 04/11/2023, it was consistent.    Pain Inventory Average Pain 5 Pain Right Now 5 My pain is sharp, stabbing, tingling, and aching  In the last 24 hours, has pain interfered with the following? General activity 5 Relation with others 5 Enjoyment of life 5What TIME of day is your pain at its worst? evening Sleep (in general) Fair  Pain is worse with: walking, bending, standing, and some activites Pain improves with: rest, heat/ice, and medication Relief from Meds: 3  Family History  Problem Relation Age of Onset   Other Mother        complications from flu   Other Father        unsure of cause   Colon cancer Neg Hx    Social History   Socioeconomic History   Marital status: Widowed    Spouse name: Not on file   Number of children: 3   Years of education: 16 years   Highest education level: Not on file  Occupational History   Occupation: Retired  Tobacco Use   Smoking status: Former    Current packs/day: 0.00    Average packs/day: 0.5 packs/day for 20.0 years (10.0 ttl pk-yrs)    Types: Cigarettes    Start date: 02/03/1956    Quit date: 02/03/1976    Years since quitting: 47.4   Smokeless tobacco: Never  Vaping Use   Vaping status: Never Used  Substance and Sexual Activity   Alcohol use: No   Drug use: No   Sexual activity: Not Currently    Birth control/protection: Post-menopausal  Other Topics Concern   Not  on file  Social History Narrative   Lives at home with husband.   Right-handed.   No caffeine use.   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  (08/07/2021)   Overall Financial Resource Strain (CARDIA)    Difficulty of Paying Living Expenses: Not hard at all  Food Insecurity: No Food Insecurity (08/31/2022)   Hunger Vital Sign    Worried About Running Out of Food in the Last Year: Never true    Ran Out of Food in the Last Year: Never true  Transportation Needs: No Transportation Needs (08/31/2022)   PRAPARE - Administrator, Civil Service (Medical): No    Lack of Transportation (Non-Medical): No  Physical Activity: Inactive (08/31/2022)   Exercise Vital Sign    Days of Exercise per Week: 0 days    Minutes of Exercise per Session: 0 min  Stress: No Stress Concern Present (08/31/2022)   [name] of Occupational Health - Occupational Stress Questionnaire    Feeling of Stress : Not at all  Social Connections: Moderately Isolated (08/07/2021)   Social Connection and Isolation Panel [NHANES]    Frequency of Communication with Friends and Family: More than three times a week    Frequency of Social Gatherings with  Friends and Family: More than three times a week    Attends Religious Services: More than 4 times per year    Active Member of Clubs or Organizations: No    Attends Banker Meetings: Never    Marital Status: Widowed   Past Surgical History:  Procedure Laterality Date   23 HOUR PH STUDY N/A 06/21/2018   Procedure: 24 HOUR PH STUDY;  Surgeon: San Sandor GAILS, DO;  Location: [facility];  Service: Gastroenterology;  Laterality: N/A;  with impedance   ABDOMINAL HYSTERECTOMY  2010   ANAL RECTAL MANOMETRY N/A 09/19/2019   Procedure: ANO RECTAL MANOMETRY;  Surgeon: Shila Gustav GAILS, MD;  Location: [facility];  Service: Endoscopy;  Laterality: N/A;   APPENDECTOMY  1949   BACK SURGERY  1973   BIOPSY  06/10/2020    Procedure: BIOPSY;  Surgeon: San Sandor GAILS, DO;  Location: [facility];  Service: Gastroenterology;;  EGD and COLON   BREAST SURGERY     CATARACT EXTRACTION Bilateral 2011   CHOLECYSTECTOMY     COLON RESECTION N/A 06/12/2020   Procedure: LAPAROSCOPIC COLON RESECTION;  Surgeon: Vernetta Berg, MD;  Location: [facility];  Service: General;  Laterality: N/A;   COLONOSCOPY WITH PROPOFOL  N/A 06/10/2020   Procedure: COLONOSCOPY WITH PROPOFOL ;  Surgeon: San Sandor GAILS, DO;  Location: [facility];  Service: Gastroenterology;  Laterality: N/A;   ESOPHAGEAL MANOMETRY N/A 06/21/2018   Procedure: ESOPHAGEAL MANOMETRY (EM);  Surgeon: San Sandor GAILS, DO;  [facility]: [facility];  Service: Gastroenterology;  Laterality: N/A;   ESOPHAGOGASTRODUODENOSCOPY (EGD) WITH PROPOFOL  N/A 06/10/2020   Procedure: ESOPHAGOGASTRODUODENOSCOPY (EGD) WITH PROPOFOL ;  Surgeon: San Sandor GAILS, DO;  Location: [facility];  Service: Gastroenterology;  Laterality: N/A;   GALLBLADDER SURGERY  2015   INCISIONAL HERNIA REPAIR N/A 09/16/2021   Procedure: OPEN INCISIONAL HERNIA REPAIR WITH MESH;  Surgeon: Vernetta Berg, MD;  Location: [facility];  Service: General;  Laterality: N/A;   JOINT REPLACEMENT     RT TOTAL HIP / RT TOTAL KNEE   MASTECTOMY  1998   BILATERAL    PH IMPEDANCE STUDY  06/21/2018   Procedure: PH IMPEDANCE STUDY;  Surgeon: San Sandor GAILS, DO;  Location: [facility];  Service: Gastroenterology;;   POLYPECTOMY  06/10/2020   Procedure: POLYPECTOMY;  Surgeon: San Sandor GAILS, DO;  [facility]: [facility];  Service: Gastroenterology;;   SKIN CANCER EXCISION  2016   RT SIDE OF NOSE   SUBMUCOSAL TATTOO INJECTION  06/10/2020   Procedure: SUBMUCOSAL TATTOO INJECTION;  Surgeon: San Sandor GAILS, DO;  Location: [facility];  Service: Gastroenterology;;   TONSILLECTOMY  1953   TOTAL HIP ARTHROPLASTY  2010   RIGHT   TOTAL HIP ARTHROPLASTY Left 05/09/2015   Procedure: LEFT TOTAL HIP ARTHROPLASTY ANTERIOR  APPROACH;  Surgeon: Dempsey Moan, MD;  Location: [facility];  Service: Orthopedics;  Laterality: Left;   TOTAL KNEE ARTHROPLASTY  2001   TUMOR REMOVAL  2012   ABDOMINAL - NON CANCEROUS   Past Surgical History:  Procedure Laterality Date   30 HOUR PH STUDY N/A 06/21/2018   Procedure: 24 HOUR PH STUDY;  Surgeon: San Sandor GAILS, DO;  [facility]: [facility];  Service: Gastroenterology;  Laterality: N/A;  with impedance   ABDOMINAL HYSTERECTOMY  2010   ANAL RECTAL MANOMETRY N/A 09/19/2019   Procedure: ANO RECTAL MANOMETRY;  Surgeon: Shila Gustav GAILS, MD;  [facility]: [facility];  Service: Endoscopy;  Laterality: N/A;   APPENDECTOMY  1949   BACK SURGERY  1973   BIOPSY  06/10/2020   Procedure: BIOPSY;  Surgeon: San Sandor GAILS, DO;  [facility];  Service: Gastroenterology;;  EGD and COLON   BREAST SURGERY     CATARACT EXTRACTION Bilateral 2011   CHOLECYSTECTOMY     COLON RESECTION N/A 06/12/2020   Procedure: LAPAROSCOPIC COLON RESECTION;  Surgeon: Vernetta Berg, MD;  [facility]: [facility];  Service: General;  Laterality: N/A;   COLONOSCOPY WITH PROPOFOL  N/A 06/10/2020   Procedure: COLONOSCOPY WITH PROPOFOL ;  Surgeon: San Sandor GAILS, DO;  [facility];  Service: Gastroenterology;  Laterality: N/A;   ESOPHAGEAL MANOMETRY N/A 06/21/2018   Procedure: ESOPHAGEAL MANOMETRY (EM);  Surgeon: San Sandor GAILS, DO;  [facility];  Service: Gastroenterology;  Laterality: N/A;   ESOPHAGOGASTRODUODENOSCOPY (EGD) WITH PROPOFOL  N/A 06/10/2020   Procedure: ESOPHAGOGASTRODUODENOSCOPY (EGD) WITH PROPOFOL ;  Surgeon: San Sandor GAILS, DO;  [facility];  Service: Gastroenterology;  Laterality: N/A;   GALLBLADDER SURGERY  2015   INCISIONAL HERNIA REPAIR N/A 09/16/2021   Procedure: OPEN INCISIONAL HERNIA REPAIR WITH MESH;  Surgeon: Vernetta Berg, MD;  [facility];  Service: General;  Laterality: N/A;   JOINT REPLACEMENT     RT TOTAL HIP / RT TOTAL KNEE    MASTECTOMY  1998   BILATERAL    PH IMPEDANCE STUDY  06/21/2018   Procedure: PH IMPEDANCE STUDY;  Surgeon: San Sandor GAILS, DO;  [facility]: [facility];  Service: Gastroenterology;;   POLYPECTOMY  06/10/2020   Procedure: POLYPECTOMY;  Surgeon: San Sandor GAILS, DO;  [facility];  Service: Gastroenterology;;   SKIN CANCER EXCISION  2016   RT SIDE OF NOSE   SUBMUCOSAL TATTOO INJECTION  06/10/2020   Procedure: SUBMUCOSAL TATTOO INJECTION;  Surgeon: San Sandor GAILS, DO;  [facility]: [facility];  Service: Gastroenterology;;   TONSILLECTOMY  1953   TOTAL HIP ARTHROPLASTY  2010   RIGHT   TOTAL HIP ARTHROPLASTY Left 05/09/2015   Procedure: LEFT TOTAL HIP ARTHROPLASTY ANTERIOR APPROACH;  Surgeon: Dempsey Moan, MD;  Location: [facility];  Service: Orthopedics;  Laterality: Left;   TOTAL KNEE ARTHROPLASTY  2001   TUMOR REMOVAL  2012   ABDOMINAL - NON CANCEROUS   Past Medical History:  Diagnosis Date   Arthritis    Cancer (HCC)    HX BREAST CANCER/ SKIN CANCER   Colon cancer (HCC)    Complication of anesthesia    N/V WITH MORPHINE    Difficulty sleeping    Diverticulosis    Fractured hip (HCC)    LEFT - AUG 2016   GERD (gastroesophageal reflux disease)    Hyperlipidemia    Hypertension    Melanoma (HCC)    Neuropathy    Nocturia    Osteopenia    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  Opioid Risk Score:   Fall Risk Score:  `1  Depression screen PHQ 2/9     05/30/2023    1:26 PM 04/11/2023   11:04 AM 02/14/2023   11:05 AM 01/14/2023   11:07 AM 12/20/2022   11:43 AM 11/15/2022   11:26 AM 10/21/2022   11:16 AM  Depression screen PHQ 2/9  Decreased Interest 1 0 0 0 0 0 0  Down, Depressed, Hopeless 0 0 0 0 0 0 0  PHQ - 2  Score 1 0 0 0 0 0 0      Review  of Systems  Musculoskeletal:  Positive for back pain.       Pain in low back down back of both legs Bilateral knee pain down to feet  All other systems reviewed and are negative.     Objective:   Physical Exam Vitals and nursing note reviewed.  Constitutional:      Appearance: Normal appearance.  Cardiovascular:     Rate and Rhythm: Normal rate and regular rhythm.     Pulses: Normal pulses.     Heart sounds: Normal heart sounds.  Pulmonary:     Effort: Pulmonary effort is normal.     Breath sounds: Normal breath sounds.  Musculoskeletal:     Comments: Normal Muscle Bulk and Muscle Testing Reveals:  Upper Extremities: Full ROM and Muscle Strength 5/5 Bilateral AC Joint Tenderness Thoracic Paraspinal Tenderness: T-7-T-10   Lumbar Paraspinal Tenderness: L-4-L-5 Lower Extremities: Full ROM and Muscle Strength 5/5 Arises from Table slowly using walker for support Narrow Based  Gait     Skin:    General: Skin is warm and dry.  Neurological:     Mental Status: She is alert and oriented to person, place, and time.  Psychiatric:        Mood and Affect: Mood normal.        Behavior: Behavior normal.         Assessment & Plan:  Chronic Bilateral Leg pain: Paresthesia: Continue to Monitor. 07/06/2023 2. Paresthesia Harrie Radiculitis: Ms. Scadden has weaned herself off the Lyrica  due to daytime drowsiness. We will continue to monitor. 07/06/2023 3. Pain of Left Wrist/ : No complaints today. S/P Carpal Tunnel Release on 06/03/2017 by Dr. Colon. Dr. Colon Following. 0205/2025. 4. Fracture of superior pubic ramus.  Dr. Melodi Following.  Continue to monitor. 07/06/2023. 5. Bilateral Knee OA: Continue Voltaren  Gel.  Continue to monitor. Orthopedist following. 07/06/2023 6. Polyarthralgia: Continue to alternate with heat and ice therapy. Continue current medication regime. Continue to monitor. 07/06/2023. 7. Chronic Pain Syndrome: Refilled::Hydrocodone   10/325 one tablet 4 times a  day as needed for pain #120. We will continue the opioid monitoring program, this consists of regular clinic visits, examinations, urine drug screen, pill counts as well as use of [place]  Controlled Substance Reporting system. A 12 month History has been reviewed on the [place]  Controlled Substance Reporting System on 07/06/2023. 8. Lumbar Compression Fracture L2 and L3:  Ms. Franchini refused physical therapy. Continue with rest/ heat therapy. Continue to Monitor 07/06/2023. 9. Muscle Spasm: Continue  Flexeril  5 mg at HS. Continue to monitor. 07/06/2023 10. Right Shoulder Pain: No complaints today. Continue HEP as Tolerated. Alternate Ice and Heat Therapy. 07/06/2023  11. Bilateral  Greater Trochanter Bursitis:  Continue to Monitor. Continue HEP as Tolerated. 07/06/2023     F/U in 1 Month

## 2023-07-06 NOTE — Patient Instructions (Signed)
 Dr Altamease Asters : Dentist   Address: [address] [place], [place] [address]  Phone: [phone]

## 2023-07-07 ENCOUNTER — Other Ambulatory Visit: Payer: Medicare Other | Admitting: Obstetrics and Gynecology

## 2023-07-10 NOTE — Progress Notes (Signed)
[facility] [address], [address] [place], [place] [address] [phone]  Date:  07/13/2023   Name:  Crystal Lee   DOB:  14-Jun-1933   MRN:  [id number]  PCP:  Pearline Cables, MD    Chief Complaint: Hypertension (Pt had a colonoscopy about 1 week ago and her BP was elevated. )   History of Present Illness:  Katja Blue is a 89 y.o. very pleasant female patient who presents with the following:  Pt seen today for elevated BP Last seen by myself in August - history of hypertension, hyperlipidemia, sleep apnea, breast cancer and colon cancer presently in remission, mild dementia and likely due to Alzheimer's disease per neurology  Most recent visit with myself was in April She was seen by general surgery at [facility] on March 26 to follow-up from her history of colon cancer and ventral incisional hernia She had a lap assisted partial colectomy January 2022 as well as chemotherapy.  She had follow-up repair of incisional hernia in April 2023  She has been following up with Dr Florian Buff with urogyn, GI and oncology Colonoscopy per Dr Barron Alvine earlier this month  Dr Myna Hidalgo 1/23: Impression and Plan: Ms. Kliewer is a very pleasant 89 yo caucasian female with history of bilateral stage Ia breast cancer diagnosed back in 1998.  She also has had locally advanced colon cancer stage IIIb with 3+ lymph nodes. She completed all of her adjuvant therapy, with Xeloda, in July 2022. Hopefully, the cystoscopy will help her out. I do not see any problems with respect to her past history of malignancy. We will plan to get her back to see [place] in another 3 to 4 months.  We note she had a colonoscopy on 2/3 and pt reports her BP was elevated at her procedure  Pt notes her SBP has recently been elevated to about 150 at home as well.  She does have a home blood pressure cuff  No chest pain or shortness of breath.  However she does note she will feel  "like my heart is pounding" especially when she lays down in bed at night.  She has noticed this for about 3 months She does not currently have a cardiologist, used to see Dr. Dulce Sellar but he has left our area  BP Readings from Last 3 Encounters:  07/13/23 134/74  07/06/23 (!) 151/77  07/04/23 (!) 166/61   Finally, she notes pain over the area of her incisional hernia repair really since right after the operation We discussed gabapentin for her side pain- pt has used this in the past and did not like Suggested trying TENS unit -she will give this a try.  Can purchase 1 online for less than $100   Patient Active Problem List   Diagnosis Date Noted   Memory impairment 07/12/2022   Incisional hernia 09/16/2021   Pain 05/05/2021   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 behavioral disturbance, psychotic disturbance, mood disturbance, or anxiety (HCC) 05/05/2021   Stroke-like symptoms 03/25/2021   Sleep apnea    Rosacea    PONV (postoperative nausea and vomiting)    Nocturia    Neuropathy    Melanoma (HCC)    Hypertension    Hyperlipidemia    Fractured hip (HCC)    Difficulty sleeping    Complication of anesthesia    Cancer (HCC)    Arthritis    Malignant neoplasm of descending colon (HCC)    Gastric polyps  Colonic mass    Diverticulosis of colon without hemorrhage    Adenomatous polyp of sigmoid colon    Acute lower GI bleeding 06/09/2020   Lower GI bleed 06/08/2020   Acute blood loss anemia 06/08/2020   HTN (hypertension) 06/08/2020   Constipation    Encounter for orthopedic follow-up care 06/18/2019   Urinary tract infection 04/20/2019   Acute pain of right wrist 03/28/2019   Tenosynovitis, wrist 03/28/2019   Acquired trigger finger of right little finger 01/15/2019   Carpal tunnel syndrome of right wrist 01/15/2019   Aftercare 08/17/2018   Lumbar post-laminectomy syndrome 07/31/2018   Heartburn    Fracture of superior pubic ramus (HCC) 06/16/2018   Radial styloid tenosynovitis of  left hand 05/16/2018   Pain of left hand 04/20/2018   Osteopenia 04/15/2018   Dyslipidemia 02/02/2018   GERD (gastroesophageal reflux disease) 02/02/2018   Trochanteric bursitis of left hip 12/23/2017   Primary osteoarthritis of left knee 10/17/2017   History of right knee joint replacement 10/17/2017   Pain in both lower extremities 08/02/2017   Stage 1 breast cancer, ER+, right (HCC) 07/04/2017   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   Pain in right hand 06/10/2017   Trigger finger of right hand 06/10/2017   Paresthesia 02/09/2017   Low back pain 02/09/2017   Gait abnormality 02/09/2017   OA (osteoarthritis) of hip 05/09/2015    Past Medical History:  Diagnosis Date   Arthritis    Cancer (HCC)    HX BREAST CANCER/ SKIN CANCER   Colon cancer (HCC)    Complication of anesthesia    N/V WITH MORPHINE   Difficulty sleeping    Diverticulosis    Fractured hip (HCC)    LEFT - AUG 2016   GERD (gastroesophageal reflux disease)    Hyperlipidemia    Hypertension    Melanoma (HCC)    Neuropathy    Nocturia    Osteopenia    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   PONV (postoperative nausea and vomiting)    PT STATES MORPHINE CAUSED N/V   Rosacea    Sleep apnea    Stage 1 breast cancer, ER+, right (HCC) 07/04/2017    Past Surgical History:  Procedure Laterality Date   45 HOUR PH STUDY N/A 06/21/2018   Procedure: 24 HOUR PH STUDY;  Surgeon: Shellia Cleverly, DO;  Location: [facility];  Service: Gastroenterology;  Laterality: N/A;  with impedance   ABDOMINAL HYSTERECTOMY  2010   ANAL RECTAL MANOMETRY N/A 09/19/2019   Procedure: ANO RECTAL MANOMETRY;  Surgeon: Napoleon Form, MD;  Location: [facility];  Service: Endoscopy;  Laterality: N/A;   APPENDECTOMY  1949   BACK SURGERY  1973   BIOPSY  06/10/2020   Procedure: BIOPSY;  Surgeon: Shellia Cleverly, DO;  Location: [facility];  Service: Gastroenterology;;  EGD and COLON   BREAST SURGERY      CATARACT EXTRACTION Bilateral 2011   CHOLECYSTECTOMY     COLON RESECTION N/A 06/12/2020   Procedure: LAPAROSCOPIC COLON RESECTION;  Surgeon: Abigail Miyamoto, MD;  Location: [facility];  Service: General;  Laterality: N/A;   COLONOSCOPY WITH PROPOFOL N/A 06/10/2020   Procedure: COLONOSCOPY WITH PROPOFOL;  Surgeon: Shellia Cleverly, DO;  Location: [facility];  Service: Gastroenterology;  Laterality: N/A;   ESOPHAGEAL MANOMETRY N/A 06/21/2018   Procedure: ESOPHAGEAL MANOMETRY (EM);  Surgeon: Shellia Cleverly, DO;  Location: [facility];  Service: Gastroenterology;  Laterality: N/A;   ESOPHAGOGASTRODUODENOSCOPY (EGD) WITH PROPOFOL N/A 06/10/2020  Procedure: ESOPHAGOGASTRODUODENOSCOPY (EGD) WITH PROPOFOL;  Surgeon: Shellia Cleverly, DO;  Location: [facility];  Service: Gastroenterology;  Laterality: N/A;   GALLBLADDER SURGERY  2015   INCISIONAL HERNIA REPAIR N/A 09/16/2021   Procedure: OPEN INCISIONAL HERNIA REPAIR WITH MESH;  Surgeon: Abigail Miyamoto, MD;  Location: [facility];  Service: General;  Laterality: N/A;   JOINT REPLACEMENT     RT TOTAL HIP / RT TOTAL KNEE   MASTECTOMY  1998   BILATERAL    PH IMPEDANCE STUDY  06/21/2018   Procedure: PH IMPEDANCE STUDY;  Surgeon: Shellia Cleverly, DO;  Location: [facility];  Service: Gastroenterology;;   POLYPECTOMY  06/10/2020   Procedure: POLYPECTOMY;  Surgeon: Shellia Cleverly, DO;  Location: [facility];  Service: Gastroenterology;;   SKIN CANCER EXCISION  2016   RT SIDE OF NOSE   SUBMUCOSAL TATTOO INJECTION  06/10/2020   Procedure: SUBMUCOSAL TATTOO INJECTION;  Surgeon: Shellia Cleverly, DO;  Location: [facility];  Service: Gastroenterology;;   TONSILLECTOMY  1953   TOTAL HIP ARTHROPLASTY  2010   RIGHT   TOTAL HIP ARTHROPLASTY Left 05/09/2015   Procedure: LEFT TOTAL HIP ARTHROPLASTY ANTERIOR APPROACH;  Surgeon: Ollen Gross, MD;  Location: [facility];  Service: Orthopedics;  Laterality: Left;   TOTAL KNEE ARTHROPLASTY  2001   TUMOR  REMOVAL  2012   ABDOMINAL - NON CANCEROUS    Social History   Tobacco Use   Smoking status: Former    Current packs/day: 0.00    Average packs/day: 0.5 packs/day for 20.0 years (10.0 ttl pk-yrs)    Types: Cigarettes    Start date: 02/03/1956    Quit date: 02/03/1976    Years since quitting: 47.4   Smokeless tobacco: Never  Vaping Use   Vaping status: Never Used  Substance Use Topics   Alcohol use: No   Drug use: No    Family History  Problem Relation Age of Onset   Other Mother        complications from flu   Other Father        unsure of cause   Colon cancer Neg Hx     Allergies  Allergen Reactions   Morphine Nausea And Vomiting   Propoxyphene Nausea And Vomiting, Palpitations and Other (See Comments)    Darvocet Causes Sweats   Donepezil Diarrhea and Other (See Comments)   Tape Rash and Other (See Comments)    Medication list has been reviewed and updated.  Current Outpatient Medications on File Prior to Visit  Medication Sig Dispense Refill   aspirin EC 81 MG tablet Take 81 mg by mouth daily. Swallow whole.     atorvastatin (LIPITOR) 20 MG tablet Take 1 tablet (20 mg total) by mouth daily. 90 tablet 3   bacitracin ophthalmic ointment Apply to eyelids at bedtime as needed. 3.5 g 6   cyclobenzaprine (FLEXERIL) 5 MG tablet Take 1 tablet (5 mg total) by mouth at bedtime as needed for muscle spasms. 30 tablet 1   estradiol (ESTRACE) 0.1 MG/GM vaginal cream Place 0.5 g vaginally 2 (two) times a week. Place 0.5g nightly for two weeks then twice a week after 42.5 g 11   HYDROcodone-acetaminophen (NORCO) 10-325 MG tablet Take 1 tablet by mouth every 6 (six) hours as needed. 120 tablet 0   ipratropium (ATROVENT) 0.06 % nasal spray Place 2 sprays into both nostrils 3 (three) times daily. 15 mL 5   losartan (COZAAR) 25 MG tablet Take 1 tablet (25 mg total) by mouth  daily. 90 tablet 3   memantine (NAMENDA) 5 MG tablet Take 1 tablet (5 mg total) by mouth 2 (two) times daily. 180  tablet 3   Multiple Vitamins-Minerals (MULTIVITAMIN ADULTS) TABS Take 1 tablet by mouth daily.     naloxegol oxalate (MOVANTIK) 25 MG TABS tablet Take 1 tablet (25 mg total) by mouth daily. 60 tablet 3   ondansetron (ZOFRAN-ODT) 4 MG disintegrating tablet Take 1 tablet (4 mg total) by mouth every 8 (eight) hours as needed for nausea or vomiting. (Patient taking differently: Take 4 mg by mouth as needed for nausea or vomiting.) 30 tablet 1   sulfamethoxazole-trimethoprim (BACTRIM) 400-80 MG tablet Take 0.5 tablets by mouth daily. 90 tablet 0   valACYclovir (VALTREX) 1000 MG tablet Take 1 tablet (1,000 mg total) by mouth 2 (two) times daily. 60 tablet 2   vitamin B-12 (CYANOCOBALAMIN) 1000 MCG tablet Take 1,000 mcg by mouth daily.     albuterol (VENTOLIN HFA) 108 (90 Base) MCG/ACT inhaler Inhale 1-2 puffs into the lungs every 6 (six) hours as needed for up to 5 days for wheezing or shortness of breath. 8 g 0   Current Facility-Administered Medications on File Prior to Visit  Medication Dose Route Frequency Provider Last Rate Last Admin   lidocaine (XYLOCAINE) 2 % jelly 1 Application  1 Application Urethral Once         Review of Systems:  As per HPI- otherwise negative.   Physical Examination: Vitals:   07/13/23 1310  BP: 134/74  Pulse: 81  Resp: 18  Temp: 97.9 F (36.6 C)  SpO2: 99%   Vitals:   07/13/23 1310  Weight: 202 lb (91.6 kg)  Height: 5\' 6"  (1.676 m)   Body mass index is 32.6 kg/m. Ideal Body Weight: Weight in (lb) to have BMI = 25: 154.6  GEN: no acute distress.  Mildly obese, looks her normal self HEENT: Atraumatic, Normocephalic.  Ears and Nose: No external deformity. CV: RRR, No M/G/R. No JVD. No thrill. No extra heart sounds. PULM: CTA B, no wheezes, crackles, rhonchi. No retractions. No resp. distress. No accessory muscle use. ABD: S, NT, ND, +BS. No rebound. No HSM. EXTR: No c/c/e PSYCH: Normally interactive. Conversant.   EKG: SR with stable left ant  block, change in V2 compared with previous tracing 8/24 Assessment and Plan: Palpitations - Plan: EKG 12-Lead, LONG TERM MONITOR (3-14 DAYS), Ambulatory referral to Cardiology, TSH, Troponin I, CANCELED: Stat Troponin T  Left lateral abdominal pain  Essential hypertension  Patient seen today for follow-up She is concerned that her blood pressure is running too high.  However at visit today her blood pressure looks okay We will have her try taking 1-1/2 tablets of her losartan 25 to gently increase her antihypertensive therapy and see how she responds She notes palpitations, EKG is abnormal but nonacute.  She denies chest pain.  I will obtain a troponin, referral to cardiology and ordered Zio patch We discussed her left side abdominal pain, unfortunately she has suffered from this for couple of years. She had a colonoscopy just recently, lipase has been normal.  We suspect her pain is due to adhesions and possible superficial nerve damage from her previous hernia repair.  I suggested that she try a TENS unit and she will think about this  Signed Abbe Amsterdam, MD  Received her troponin, normal.    Results for orders placed or performed in visit on 07/13/23  Troponin I   Collection Time: 07/13/23  2:03 PM  Result Value Ref Range   Troponin I 5 < OR = 47 ng/L   2/13- received her TSH- letter to pt  Results for orders placed or performed in visit on 07/13/23  TSH   Collection Time: 07/13/23  2:03 PM  Result Value Ref Range   TSH 1.03 0.35 - 5.50 uIU/mL  Troponin I   Collection Time: 07/13/23  2:03 PM  Result Value Ref Range   Troponin I 5 < OR = 47 ng/L

## 2023-07-12 ENCOUNTER — Ambulatory Visit: Payer: Medicare Other | Admitting: Gastroenterology

## 2023-07-13 ENCOUNTER — Ambulatory Visit: Payer: Medicare Other | Attending: Family Medicine

## 2023-07-13 ENCOUNTER — Ambulatory Visit: Payer: Medicare Other | Admitting: Family Medicine

## 2023-07-13 VITALS — BP 134/74 | HR 81 | Temp 97.9°F | Resp 18 | Ht 66.0 in | Wt 202.0 lb

## 2023-07-13 DIAGNOSIS — R109 Unspecified abdominal pain: Secondary | ICD-10-CM | POA: Diagnosis not present

## 2023-07-13 DIAGNOSIS — R002 Palpitations: Secondary | ICD-10-CM

## 2023-07-13 DIAGNOSIS — I10 Essential (primary) hypertension: Secondary | ICD-10-CM | POA: Diagnosis not present

## 2023-07-13 LAB — TROPONIN I: Troponin I: 5 ng/L (ref <=47)

## 2023-07-13 NOTE — Patient Instructions (Addendum)
You might try a TENS unit for your side pain- please let me know if this helps you Ok to take 1.5 of your Losartan pills for BP; let me know how this seems to work for you  I will get you set up with cardiology and also for a home heart monitor Please seek care if any changes or other concerns

## 2023-07-13 NOTE — Progress Notes (Unsigned)
EP to read.

## 2023-07-14 ENCOUNTER — Encounter: Payer: Self-pay | Admitting: Physician Assistant

## 2023-07-14 ENCOUNTER — Ambulatory Visit (INDEPENDENT_AMBULATORY_CARE_PROVIDER_SITE_OTHER): Payer: Medicare Other | Admitting: Physician Assistant

## 2023-07-14 VITALS — BP 143/72 | HR 80 | Resp 18 | Ht 66.0 in | Wt 204.0 lb

## 2023-07-14 DIAGNOSIS — K5790 Diverticulosis of intestine, part unspecified, without perforation or abscess without bleeding: Secondary | ICD-10-CM | POA: Insufficient documentation

## 2023-07-14 DIAGNOSIS — F03A0 Unspecified dementia, mild, without behavioral disturbance, psychotic disturbance, mood disturbance, and anxiety: Secondary | ICD-10-CM

## 2023-07-14 DIAGNOSIS — C189 Malignant neoplasm of colon, unspecified: Secondary | ICD-10-CM | POA: Insufficient documentation

## 2023-07-14 LAB — TSH: TSH: 1.03 u[IU]/mL (ref 0.35–5.50)

## 2023-07-14 MED ORDER — MEMANTINE HCL 5 MG PO TABS
5.0000 mg | ORAL_TABLET | Freq: Two times a day (BID) | ORAL | 3 refills | Status: DC
Start: 2023-07-14 — End: 2023-12-05

## 2023-07-14 NOTE — Progress Notes (Signed)
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 RH female with a history of hypertension, hyperlipidemia, sleep apnea, iron deficiency anemia, stage IIIb colon cancer recently off Xeloda, on Zometa yearly, history of stage Ia breast cancer, anxiety, depression, and mild dementia likely due to  Alzheimer's disease seen today in follow up for memory loss. Patient is currently on memantine 5 mg daily, could not tolerate twice a day due to nausea (although she admits that she has been taking hydrocodone which may have contributed to the symptoms). Patient is able to perform her ADLs to her ability, drive without any significant issues.  Memory appears to be stable, again she declined to have her MMSE performed today.    Follow up in 6  months. Continue memantine 5 mg  at night by choice, side effects discussed (donepezil GI side effects) Recommend good control of her cardiovascular risk factors Continue to control mood as per PCP     Subjective:    This patient is here alone.  Previous records as well as any outside records available were reviewed prior to todays visit. Patient was last seen on 01/24/2023.  She declined MMSE during her last visit, her initial MoCA on 05/05/2021 was 18/30   Any changes in memory since last visit? "  I do not know, my children think I am ok, about the same". She enjoys doing crossword puzzles, memorizing capitals of the world, reading, watching documentaries, marine life.  Long-term memory is good.  She is able to participate in her ADLs without any issues. repeats oneself?  Endorsed Disoriented when walking into a room?  Patient denies     Leaving objects?  May misplace things but not in unusual places   Wandering behavior?  denies   Any personality changes since last visit?  Denies.   Any worsening depression?:  Denies.   Hallucinations or paranoia?  Denies.   Seizures? denies     Any sleep changes?  Does not sleep very well, takes melatonin as needed.  Denies vivid dreams, REM behavior or sleepwalking   Sleep apnea?   Denies.   Any hygiene concerns? Denies.  Independent of bathing and dressing?  Endorsed  Does the patient needs help with medications?  Patient is in charge   Who is in charge of the finances?  Patient is in charge     Any changes in appetite?  denies.  She favors sweets.    Patient have trouble swallowing? Denies.   Does the patient cook? No Any headaches?   denies   Chronic back pain  denies   Ambulates with difficulty?  She has chronic bilateral lower extremity paresthesias and left radiculitis, L2-3 compression fracture as well as multiple areas of arthritis and spasms, with chronic pain syndrome, on several agents followed by PCP.  She declines PT.  She is on rest and heat therapy.  Uses a walker for long distance, otherwise a cane.  Recent falls or head injuries? While cleaning the toilet and tripped and hit the R shoulder, no LOC, no head injury.  Unilateral weakness, numbness or tingling? denies   Any tremors?  She has chronic resting tremors since leaving [place] many decades ago. Any anosmia?  Denies   Any incontinence of urine?  Endorsed, wears depends.  She has a history of recurrent UTIs, on January 2025.  On May 23, 2023 she had an episode of acute cystitis with hematuria, requiring  presentation to the ER. Any bowel dysfunction?   Denies      Patient lives alone Does the patient drive?  Endorsed, denies getting lost.    Initial visit 05/05/2021 the patient is seen in neurologic consultation at the request of Copland, Gwenlyn Found, MD for the evaluation of memory.  The patient is here alone.  This is a 89 y.o. year old RH  female who presents today after hospitalization for strokelike symptoms.  It was also noted that she may have had cognitive issues during the presentation, at which time a MoCA was performed, yielding a result of 18/30.  Of  note, the patient denies having any cognitive issues, treated in some of short-term memory difficulties to "old age ".  During the hospitalization, she was diagnosed with small vessel disease, evidence of amyloid angiopathy per MRI, central nervous system degenerative disorder, and mild to moderate Alzheimer's disease per review of the notes.  She was to follow-up at [facility], but she wanted to seek additional neurologic opinion with [facility] neurology instead. For her memory, she denies repeating herself, or being disoriented when walking into the room.  She denies leaving objects in unusual places.  She ambulates with her walker for safety.  She had prior falls, fracturing her pelvis, and "living with pain over the last 89 years old older ".  She only had 1 minor head injury about 17 years ago while cleaning a window, and hitting her head while she was moving forward.  She did not lose any consciousness at that time.  She denies being disoriented when walking, or wandering behavior.  She drives any distances, "without GPS I do not need it ".  She stopped driving long distances however when she came from [place] in 2018 to retire, and her vision was not as good.  Since her husband's death, she lives alone, and has home health visiting her.  Her mood is good, she denies depression or irritability.  She enjoys painting, and coloring.  She is to be able to do crossword puzzles but she stopped due to "eye problems ".  She sleeps well, denies vivid dreams or sleepwalking.  She denies hallucinations or paranoia.  There are no hygiene concerns.  She is independent of bathing and dressing.  Her medications are in a pillbox.  Her finances are done by her, only taxes are done by one of her children.  Her appetite is good, denies any trouble swallowing.  She cooks without forgetting any common recipes, or burning the stove.     As for her strokelike symptoms, the patient reports that she had similar episodes in the year 2000, when  she noticed mild facial drooping and numbness, which quickly resolved, without recurrence.  On the day of evaluation in October 2022, she was at her doctor's office, when she had 20 second  episode (she denies this was 20-minute episodes as in chart).  Her PCP told her to go "downstairs today urgent care, to check that out ".  From the urgent care, the transported her to the ER, for further evaluation, suspecting that the patient was having a TIA.  Per chart notes, she may have had possible right facial droop and right-sided weakness, although she states that for the last 20 years, she has slightly decreased strength on the right side of her body.  The ER neurologist saw her, and work-up was initiated.  MRI of the brain did not show acute CVA, but did show amyloid angiopathy.  EEG was  without seizures.  2D echo was unremarkable.  Neurology recommended aspirin 81 mg on discharge, and no DAPT was going to be given, because of concern of amyloid angiopathy.  Lipitor 20 was added on discharge.  No further episodes of strokelike symptoms since then.  Of note, during that hospitalization, she also was found to have UTI "I know I had it, I have regular UTIs".  EKG shows sinus tachycardia.  COVID was negative.  Neurology recommended that she had a better control of her blood pressure to lays down on 140 systolic.  No stroke was diagnosed.  She denies today any headaches, double vision, dizziness, focal numbness or tingling, unilateral weakness other than the chronic mild right upper extremity as mentioned above, tremors or anosmia.  No history of seizures.  She denies urine incontinence, but occasionally wears a pad when going out.  She denies any retention.  She has a long history of constipation.  She denies any diarrhea.  She denies sleep apnea, although in chart it shows in certain areas that she may have it.  She denies any alcohol or tobacco.  Family history negative for dementia.  The patient is originally from  [place].    MRI brain  1. No evidence of acute intracranial abnormality, including infarct. 2. Numerous tiny scattered foci of susceptibility artifact throughout the cortex bilaterally, greatest in the parieto-occipital lobes. While nonspecific, findings could relate to prior microhemorrhages from amyloid angiopathy or prior microemboli. The distribution is atypical for hypertensive hemorrhages.     EMG/NCS 2019 bilateral lower extremity paresthesia, bilateral leg pain, EMG nerve conduction study then showed no large fiber peripheral neuropathy    PREVIOUS MEDICATIONS: Donepezil, GI side effects.  CURRENT MEDICATIONS:  Outpatient Encounter Medications as of 07/14/2023  Medication Sig   aspirin EC 81 MG tablet Take 81 mg by mouth daily. Swallow whole.   atorvastatin (LIPITOR) 20 MG tablet Take 1 tablet (20 mg total) by mouth daily.   bacitracin ophthalmic ointment Apply to eyelids at bedtime as needed.   cyclobenzaprine (FLEXERIL) 5 MG tablet Take 1 tablet (5 mg total) by mouth at bedtime as needed for muscle spasms.   estradiol (ESTRACE) 0.1 MG/GM vaginal cream Place 0.5 g vaginally 2 (two) times a week. Place 0.5g nightly for two weeks then twice a week after   HYDROcodone-acetaminophen (NORCO) 10-325 MG tablet Take 1 tablet by mouth every 6 (six) hours as needed.   ipratropium (ATROVENT) 0.06 % nasal spray Place 2 sprays into both nostrils 3 (three) times daily.   losartan (COZAAR) 25 MG tablet Take 1 tablet (25 mg total) by mouth daily.   Multiple Vitamins-Minerals (MULTIVITAMIN ADULTS) TABS Take 1 tablet by mouth daily.   naloxegol oxalate (MOVANTIK) 25 MG TABS tablet Take 1 tablet (25 mg total) by mouth daily.   ondansetron (ZOFRAN-ODT) 4 MG disintegrating tablet Take 1 tablet (4 mg total) by mouth every 8 (eight) hours as needed for nausea or vomiting. (Patient taking differently: Take 4 mg by mouth as needed for nausea or vomiting.)   sulfamethoxazole-trimethoprim (BACTRIM) 400-80 MG  tablet Take 0.5 tablets by mouth daily.   valACYclovir (VALTREX) 1000 MG tablet Take 1 tablet (1,000 mg total) by mouth 2 (two) times daily.   vitamin B-12 (CYANOCOBALAMIN) 1000 MCG tablet Take 1,000 mcg by mouth daily.   [DISCONTINUED] memantine (NAMENDA) 5 MG tablet Take 1 tablet (5 mg total) by mouth 2 (two) times daily.   albuterol (VENTOLIN HFA) 108 (90 Base) MCG/ACT inhaler Inhale 1-2 puffs into  the lungs every 6 (six) hours as needed for up to 5 days for wheezing or shortness of breath.   memantine (NAMENDA) 5 MG tablet Take 1 tablet (5 mg total) by mouth 2 (two) times daily.   Facility-Administered Encounter Medications as of 07/14/2023  Medication   lidocaine (XYLOCAINE) 2 % jelly 1 Application       08/31/2022    2:13 PM  MMSE - Mini Mental State Exam  Not completed: Unable to complete      05/05/2021   12:00 PM  Montreal Cognitive Assessment   Visuospatial/ Executive (0/5) 2  Naming (0/3) 3  Attention: Read list of digits (0/2) 2  Attention: Read list of letters (0/1) 1  Attention: Serial 7 subtraction starting at 100 (0/3) 1  Language: Repeat phrase (0/2) 1  Language : Fluency (0/1) 0  Abstraction (0/2) 2  Delayed Recall (0/5) 0  Orientation (0/6) 6  Total 18    Objective:     PHYSICAL EXAMINATION:    VITALS:   Vitals:   07/14/23 1045  BP: (!) 146/61  Pulse: 80  Resp: 18  SpO2: 98%  Weight: 204 lb (92.5 kg)  Height: 5\' 6"  (1.676 m)    GEN:  The patient appears stated age and is in NAD. HEENT:  Normocephalic, atraumatic.   Neurological examination:  General: NAD, well-groomed, appears stated age. Orientation: The patient is alert. Oriented to person, place and date Cranial nerves: There is good facial symmetry.anxious appearing.  The speech is fluent and clear. No aphasia or dysarthria. Fund of knowledge is appropriate. Recent and remote memory are impaired. Attention and concentration are reduced.  Able to name objects and repeat phrases.  Hearing is  intact to conversational tone.   Sensation: Sensation is intact to light touch throughout Motor: Strength is at least antigravity x4. DTR's 2/4 in UE/LE     Movement examination: Tone: There is normal tone in the UE/LE Abnormal movements: She has chronic very mild resting tremors, very mild intention and postural tremor unchanged from prior visit.  No cogwheeling.  No myoclonus.  No asterixis.   Coordination:  There is no decremation with RAM's. Normal finger to nose  Gait and Station: The patient has  some difficulty arising out of a deep-seated chair without the use of the hands. The patient's stride length is good.  Gait is cautious and narrow. Uses a walker to ambulate for stability.    Thank you for allowing [place] the opportunity to participate in the care of this nice patient. Please do not hesitate to contact us for any questions or concerns.   Total time spent on today's visit was 23 minutes dedicated to this patient today, preparing to see patient, examining the patient, ordering tests and/or medications and counseling the patient, documenting clinical information in the EHR or other health record, independently interpreting results and communicating results to the patient/family, discussing treatment and goals, answering patient's questions and coordinating care.  Cc:  Copland, Gwenlyn Found, MD  Marlowe Kays 07/14/2023 11:23 AM

## 2023-07-14 NOTE — Patient Instructions (Addendum)
It was a pleasure to see you today at our office.   Recommendations:   Followed August 20  at 11:30  Strong control of cardiovascular risk factors Continue baby aspirin and lipitor, follow up with primary doctor  Continue memantine 5 mg ok to take it at night     RECOMMENDATIONS FOR ALL PATIENTS WITH MEMORY PROBLEMS: 1. Continue to exercise (Recommend 30 minutes of walking everyday, or 3 hours every week) 2. Increase social interactions - continue going to [place] and enjoy social gatherings with friends and family 3. Eat healthy, avoid fried foods and eat more fruits and vegetables 4. Maintain adequate blood pressure, blood sugar, and blood cholesterol level. Reducing the risk of stroke and cardiovascular disease also helps promoting better memory. 5. Avoid stressful situations. Live a simple life and avoid aggravations. Organize your time and prepare for the next day in anticipation. 6. Sleep well, avoid any interruptions of sleep and avoid any distractions in the bedroom that may interfere with adequate sleep quality 7. Avoid sugar, avoid sweets as there is a strong link between excessive sugar intake, diabetes, and cognitive impairment We discussed the Mediterranean diet, which has been shown to help patients reduce the risk of progressive memory disorders and reduces cardiovascular risk. This includes eating fish, eat fruits and green leafy vegetables, nuts like almonds and hazelnuts, walnuts, and also use olive oil. Avoid fast foods and fried foods as much as possible. Avoid sweets and sugar as sugar use has been linked to worsening of memory function.  There is always a concern of gradual progression of memory problems. If this is the case, then we may need to adjust level of care according to patient needs. Support, both to the patient and caregiver, should then be put into place.    The Alzheimer's Association is here all day, every day for people facing Alzheimer's disease through  our free 24/7 Helpline: 806-567-5178. The Helpline provides reliable information and support to all those who need assistance, such as individuals living with memory loss, Alzheimer's or other dementia, caregivers, health care professionals and the public.  Our highly trained and knowledgeable staff can help you with: Understanding memory loss, dementia and Alzheimer's  Medications and other treatment options  General information about aging and brain health  Skills to provide quality care and to find the best care from professionals  Legal, financial and living-arrangement decisions Our Helpline also features: Confidential care consultation provided by master's level clinicians who can help with decision-making support, crisis assistance and education on issues families face every day  Help in a caller's preferred language using our translation service that features more than 200 languages and dialects  Referrals to local community programs, services and ongoing support     FALL PRECAUTIONS: Be cautious when walking. Scan the area for obstacles that may increase the risk of trips and falls. When getting up in the mornings, sit up at the edge of the bed for a few minutes before getting out of bed. Consider elevating the bed at the head end to avoid drop of blood pressure when getting up. Walk always in a well-lit room (use night lights in the walls). Avoid area rugs or power cords from appliances in the middle of the walkways. Use a walker or a cane if necessary and consider physical therapy for balance exercise. Get your eyesight checked regularly.  FINANCIAL OVERSIGHT: Supervision, especially oversight when making financial decisions or transactions is also recommended.  HOME SAFETY: Consider the safety of the  kitchen when operating appliances like stoves, microwave oven, and blender. Consider having supervision and share cooking responsibilities until no longer able to participate in those.  Accidents with firearms and other hazards in the house should be identified and addressed as well.   ABILITY TO BE LEFT ALONE: If patient is unable to contact 911 operator, consider using LifeLine, or when the need is there, arrange for someone to stay with patients. Smoking is a fire hazard, consider supervision or cessation. Risk of wandering should be assessed by caregiver and if detected at any point, supervision and safe proof recommendations should be instituted.  MEDICATION SUPERVISION: Inability to self-administer medication needs to be constantly addressed. Implement a mechanism to ensure safe administration of the medications.   DRIVING: Regarding driving, in patients with progressive memory problems, driving will be impaired. We advise to have someone else do the driving if trouble finding directions or if minor accidents are reported. Independent driving assessment is available to determine safety of driving.   If you are interested in the driving assessment, you can contact the following:  [employer] in [place] [phone]  [facility] [phone]  [facility] [phone] or [phone]      Mediterranean Diet A Mediterranean diet refers to food and lifestyle choices that are based on the traditions of countries located on the [employer]. This way of eating has been shown to help prevent certain conditions and improve outcomes for people who have chronic diseases, like kidney disease and heart disease. What are tips for following this plan? Lifestyle  Cook and eat meals together with your family, when possible. Drink enough fluid to keep your urine clear or pale yellow. Be physically active every day. This includes: Aerobic exercise like running or swimming. Leisure activities like gardening, walking, or housework. Get 7-8 hours of sleep each night. If recommended by your health care  provider, drink red wine in moderation. This means 1 glass a day for nonpregnant women and 2 glasses a day for men. A glass of wine equals 5 oz (150 mL). Reading food labels  Check the serving size of packaged foods. For foods such as rice and pasta, the serving size refers to the amount of cooked product, not dry. Check the total fat in packaged foods. Avoid foods that have saturated fat or trans fats. Check the ingredients list for added sugars, such as corn syrup. Shopping  At the grocery store, buy most of your food from the areas near the walls of the store. This includes: Fresh fruits and vegetables (produce). Grains, beans, nuts, and seeds. Some of these may be available in unpackaged forms or large amounts (in bulk). Fresh seafood. Poultry and eggs. Low-fat dairy products. Buy whole ingredients instead of prepackaged foods. Buy fresh fruits and vegetables in-season from local farmers markets. Buy frozen fruits and vegetables in resealable bags. If you do not have access to quality fresh seafood, buy precooked frozen shrimp or canned fish, such as tuna, salmon, or sardines. Buy small amounts of raw or cooked vegetables, salads, or olives from the deli or salad bar at your store. Stock your pantry so you always have certain foods on hand, such as olive oil, canned tuna, canned tomatoes, rice, pasta, and beans. Cooking  Cook foods with extra-virgin olive oil instead of using butter or other vegetable oils. Have meat as a side dish, and have vegetables or grains as your main dish. This means having meat in small portions  or adding small amounts of meat to foods like pasta or stew. Use beans or vegetables instead of meat in common dishes like chili or lasagna. Experiment with different cooking methods. Try roasting or broiling vegetables instead of steaming or sauteing them. Add frozen vegetables to soups, stews, pasta, or rice. Add nuts or seeds for added healthy fat at each meal. You  can add these to yogurt, salads, or vegetable dishes. Marinate fish or vegetables using olive oil, lemon juice, garlic, and fresh herbs. Meal planning  Plan to eat 1 vegetarian meal one day each week. Try to work up to 2 vegetarian meals, if possible. Eat seafood 2 or more times a week. Have healthy snacks readily available, such as: Vegetable sticks with hummus. Greek yogurt. Fruit and nut trail mix. Eat balanced meals throughout the week. This includes: Fruit: 2-3 servings a day Vegetables: 4-5 servings a day Low-fat dairy: 2 servings a day Fish, poultry, or lean meat: 1 serving a day Beans and legumes: 2 or more servings a week Nuts and seeds: 1-2 servings a day Whole grains: 6-8 servings a day Extra-virgin olive oil: 3-4 servings a day Limit red meat and sweets to only a few servings a month What are my food choices? Mediterranean diet Recommended Grains: Whole-grain pasta. Brown rice. Bulgar wheat. Polenta. Couscous. Whole-wheat bread. Orpah Cobb. Vegetables: Artichokes. Beets. Broccoli. Cabbage. Carrots. Eggplant. Green beans. Chard. Kale. Spinach. Onions. Leeks. Peas. Squash. Tomatoes. Peppers. Radishes. Fruits: Apples. Apricots. Avocado. Berries. Bananas. Cherries. Dates. Figs. Grapes. Lemons. Melon. Oranges. Peaches. Plums. Pomegranate. Meats and other protein foods: Beans. Almonds. Sunflower seeds. Pine nuts. Peanuts. Cod. Salmon. Scallops. Shrimp. Tuna. Tilapia. Clams. Oysters. Eggs. Dairy: Low-fat milk. Cheese. Greek yogurt. Beverages: Water. Red wine. Herbal tea. Fats and oils: Extra virgin olive oil. Avocado oil. Grape seed oil. Sweets and desserts: [place] yogurt with honey. Baked apples. Poached pears. Trail mix. Seasoning and other foods: Basil. Cilantro. Coriander. Cumin. Mint. Parsley. Sage. Rosemary. Tarragon. Garlic. Oregano. Thyme. Pepper. Balsalmic vinegar. Tahini. Hummus. Tomato sauce. Olives. Mushrooms. Limit these Grains: Prepackaged pasta or rice dishes.  Prepackaged cereal with added sugar. Vegetables: Deep fried potatoes (french fries). Fruits: Fruit canned in syrup. Meats and other protein foods: Beef. Pork. Lamb. Poultry with skin. Hot dogs. Tomasa Blase. Dairy: Ice cream. Sour cream. Whole milk. Beverages: Juice. Sugar-sweetened soft drinks. Beer. Liquor and spirits. Fats and oils: Butter. Canola oil. Vegetable oil. Beef fat (tallow). Lard. Sweets and desserts: Cookies. Cakes. Pies. Candy. Seasoning and other foods: Mayonnaise. Premade sauces and marinades. The items listed may not be a complete list. Talk with your dietitian about what dietary choices are right for you. Summary The Mediterranean diet includes both food and lifestyle choices. Eat a variety of fresh fruits and vegetables, beans, nuts, seeds, and whole grains. Limit the amount of red meat and sweets that you eat. Talk with your health care provider about whether it is safe for you to drink red wine in moderation. This means 1 glass a day for nonpregnant women and 2 glasses a day for men. A glass of wine equals 5 oz (150 mL). This information is not intended to replace advice given to you by your health care provider. Make sure you discuss any questions you have with your health care provider. Document Released: 01/08/2016 Document Revised: 02/10/2016 Document Reviewed: 01/08/2016 Elsevier Interactive Patient Education  2017 [employer].  Your provider has requested that you have labwork completed today. Please go to [facility] ([id number]) on the second floor of  this building before leaving the office today. You do not need to check in. If you are not called within 15 minutes please check with the front desk.

## 2023-07-15 ENCOUNTER — Encounter: Payer: Self-pay | Admitting: Cardiology

## 2023-07-15 ENCOUNTER — Ambulatory Visit: Payer: Medicare Other | Attending: Cardiology | Admitting: Cardiology

## 2023-07-15 VITALS — BP 138/60 | HR 78 | Ht 66.0 in | Wt 203.1 lb

## 2023-07-15 DIAGNOSIS — E785 Hyperlipidemia, unspecified: Secondary | ICD-10-CM | POA: Insufficient documentation

## 2023-07-15 DIAGNOSIS — R079 Chest pain, unspecified: Secondary | ICD-10-CM | POA: Insufficient documentation

## 2023-07-15 DIAGNOSIS — I10 Essential (primary) hypertension: Secondary | ICD-10-CM | POA: Diagnosis not present

## 2023-07-15 NOTE — Progress Notes (Signed)
Cardiology Office Note:    Date:  07/15/2023   ID:  Crystal Lee, DOB March 04, 1934, MRN [id number]  PCP:  Pearline Cables, MD  Cardiologist:  Garwin Brothers, MD   Referring MD: Pearline Cables, MD    ASSESSMENT:    1. Dyslipidemia   2. Primary hypertension   3. Chest pain, unspecified type    PLAN:    In order of problems listed above:  Primary prevention stressed with the patient.  Importance of compliance with diet medication stressed and patient verbalized standing. She is advised to ambulate to the best of her ability. Essential hypertension: Blood pressure stable and diet was emphasized. Mixed dyslipidemia: On lipid-lowering medications followed by primary care. Chest pain: Atypical etiology however in view of risk factors I discussed Lexiscan sestamibi and she is agreeable. Patient will be seen in follow-up appointment in 6 months or earlier if the patient has any concerns.    Medication Adjustments/Labs and Tests Ordered: Current medicines are reviewed at length with the patient today.  Concerns regarding medicines are outlined above.  Orders Placed This Encounter  Procedures   MYOCARDIAL PERFUSION IMAGING   EKG 12-Lead   No orders of the defined types were placed in this encounter.    No chief complaint on file.    History of Present Illness:    Crystal Lee is a 89 y.o. female.  Patient has past medical history of essential hypertension and dyslipidemia.  She has undergone abdominal surgeries.  She has previously undergone.  She has seen my partner in the past.  She leads a sedentary lifestyle and ambulates with a walker.  She gives history of pain in the lower aspect of the left side of the chest.  This is not related to exertion.  She is concerned about it.  Past Medical History:  Diagnosis Date   Acquired trigger finger of right little finger 01/15/2019   Acute blood loss anemia 06/08/2020   Acute lower GI bleeding 06/09/2020   Acute pain of  right wrist 03/28/2019   Adenomatous polyp of sigmoid colon    Aftercare 08/17/2018   Arthritis    Cancer (HCC)    HX BREAST CANCER/ SKIN CANCER   Carpal tunnel syndrome of right wrist 01/15/2019   Colon cancer (HCC)    Colonic mass    Complication of anesthesia    N/V WITH MORPHINE   Constipation    Difficulty sleeping    Diverticulosis    Diverticulosis of colon without hemorrhage    Dyslipidemia 02/02/2018   Encounter for orthopedic follow-up care 06/18/2019   Fracture of superior pubic ramus (HCC) 06/16/2018   Fractured hip (HCC)    LEFT - AUG 2016   Gait abnormality 02/09/2017   Gastric polyps    GERD (gastroesophageal reflux disease)    Heartburn    History of right knee joint replacement 10/17/2017   HTN (hypertension) 06/08/2020   Hyperlipidemia    Hypertension    Incisional hernia 09/16/2021   Low back pain 02/09/2017   Lower GI bleed 06/08/2020   Lumbar post-laminectomy syndrome 07/31/2018   Malignant neoplasm of descending colon (HCC)    Melanoma (HCC)    Memory impairment 07/12/2022   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 behavioral disturbance, psychotic disturbance, mood disturbance, or anxiety (HCC) 05/05/2021   Neuropathy    Nocturia    OA (osteoarthritis) of hip 05/09/2015   Osteopenia    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   Pain 05/05/2021   Pain in both lower  extremities 08/02/2017   Pain in right hand 06/10/2017   Pain of left hand 04/20/2018   Paresthesia 02/09/2017   PONV (postoperative nausea and vomiting)    PT STATES MORPHINE CAUSED N/V   Primary osteoarthritis of left knee 10/17/2017   Radial styloid tenosynovitis of left hand 05/16/2018   Rosacea    Sleep apnea    Stage 1 breast cancer, ER+, right (HCC) 07/04/2017   Stroke-like symptoms 03/25/2021   Tenosynovitis, wrist 03/28/2019   Trigger finger of right hand 06/10/2017   Trochanteric bursitis of left hip 12/23/2017   Urinary tract infection 04/20/2019    Past Surgical History:   Procedure Laterality Date   62 HOUR PH STUDY N/A 06/21/2018   Procedure: 24 HOUR PH STUDY;  Surgeon: Shellia Cleverly, DO;  Location: [facility];  Service: Gastroenterology;  Laterality: N/A;  with impedance   ABDOMINAL HYSTERECTOMY  2010   ANAL RECTAL MANOMETRY N/A 09/19/2019   Procedure: ANO RECTAL MANOMETRY;  Surgeon: Napoleon Form, MD;  Location: [facility];  Service: Endoscopy;  Laterality: N/A;   APPENDECTOMY  1949   BACK SURGERY  1973   BIOPSY  06/10/2020   Procedure: BIOPSY;  Surgeon: Shellia Cleverly, DO;  Location: [facility];  Service: Gastroenterology;;  EGD and COLON   BREAST SURGERY     CATARACT EXTRACTION Bilateral 2011   CHOLECYSTECTOMY     COLON RESECTION N/A 06/12/2020   Procedure: LAPAROSCOPIC COLON RESECTION;  Surgeon: Abigail Miyamoto, MD;  Location: [facility];  Service: General;  Laterality: N/A;   COLONOSCOPY WITH PROPOFOL N/A 06/10/2020   Procedure: COLONOSCOPY WITH PROPOFOL;  Surgeon: Shellia Cleverly, DO;  Location: [facility];  Service: Gastroenterology;  Laterality: N/A;   ESOPHAGEAL MANOMETRY N/A 06/21/2018   Procedure: ESOPHAGEAL MANOMETRY (EM);  Surgeon: Shellia Cleverly, DO;  Location: [facility];  Service: Gastroenterology;  Laterality: N/A;   ESOPHAGOGASTRODUODENOSCOPY (EGD) WITH PROPOFOL N/A 06/10/2020   Procedure: ESOPHAGOGASTRODUODENOSCOPY (EGD) WITH PROPOFOL;  Surgeon: Shellia Cleverly, DO;  Location: [facility];  Service: Gastroenterology;  Laterality: N/A;   GALLBLADDER SURGERY  2015   INCISIONAL HERNIA REPAIR N/A 09/16/2021   Procedure: OPEN INCISIONAL HERNIA REPAIR WITH MESH;  Surgeon: Abigail Miyamoto, MD;  Location: [facility];  Service: General;  Laterality: N/A;   JOINT REPLACEMENT     RT TOTAL HIP / RT TOTAL KNEE   MASTECTOMY  1998   BILATERAL    PH IMPEDANCE STUDY  06/21/2018   Procedure: PH IMPEDANCE STUDY;  Surgeon: Shellia Cleverly, DO;  Location: [facility];  Service: Gastroenterology;;   POLYPECTOMY  06/10/2020    Procedure: POLYPECTOMY;  Surgeon: Shellia Cleverly, DO;  Location: [facility];  Service: Gastroenterology;;   SKIN CANCER EXCISION  2016   RT SIDE OF NOSE   SUBMUCOSAL TATTOO INJECTION  06/10/2020   Procedure: SUBMUCOSAL TATTOO INJECTION;  Surgeon: Shellia Cleverly, DO;  Location: [facility];  Service: Gastroenterology;;   TONSILLECTOMY  1953   TOTAL HIP ARTHROPLASTY  2010   RIGHT   TOTAL HIP ARTHROPLASTY Left 05/09/2015   Procedure: LEFT TOTAL HIP ARTHROPLASTY ANTERIOR APPROACH;  Surgeon: Ollen Gross, MD;  Location: [facility];  Service: Orthopedics;  Laterality: Left;   TOTAL KNEE ARTHROPLASTY  2001   TUMOR REMOVAL  2012   ABDOMINAL - NON CANCEROUS    Current Medications: Current Meds  Medication Sig   aspirin EC 81 MG tablet Take 81 mg by mouth daily. Swallow whole.   atorvastatin (LIPITOR) 20 MG tablet Take 1  tablet (20 mg total) by mouth daily.   bacitracin ophthalmic ointment Apply to eyelids at bedtime as needed.   cyclobenzaprine (FLEXERIL) 5 MG tablet Take 1 tablet (5 mg total) by mouth at bedtime as needed for muscle spasms.   estradiol (ESTRACE) 0.1 MG/GM vaginal cream Place 0.5 g vaginally 2 (two) times a week. Place 0.5g nightly for two weeks then twice a week after   HYDROcodone-acetaminophen (NORCO) 10-325 MG tablet Take 1 tablet by mouth every 6 (six) hours as needed.   ipratropium (ATROVENT) 0.06 % nasal spray Place 2 sprays into both nostrils 3 (three) times daily.   losartan (COZAAR) 25 MG tablet Take 1 tablet (25 mg total) by mouth daily. (Patient taking differently: Take 37.5 mg by mouth daily.)   memantine (NAMENDA) 5 MG tablet Take 1 tablet (5 mg total) by mouth 2 (two) times daily.   Multiple Vitamins-Minerals (MULTIVITAMIN ADULTS) TABS Take 1 tablet by mouth daily.   ondansetron (ZOFRAN-ODT) 4 MG disintegrating tablet Take 1 tablet (4 mg total) by mouth every 8 (eight) hours as needed for nausea or vomiting.   sulfamethoxazole-trimethoprim (BACTRIM) 400-80 MG  tablet Take 0.5 tablets by mouth daily.   valACYclovir (VALTREX) 1000 MG tablet Take 1 tablet (1,000 mg total) by mouth 2 (two) times daily.   vitamin B-12 (CYANOCOBALAMIN) 1000 MCG tablet Take 1,000 mcg by mouth daily.   Current Facility-Administered Medications for the 07/15/23 encounter (Office Visit) with Spyros Winch, Aundra Dubin, MD  Medication   lidocaine (XYLOCAINE) 2 % jelly 1 Application     Allergies:   Morphine, Propoxyphene, Donepezil, and Tape   Social History   Socioeconomic History   Marital status: Widowed    Spouse name: Not on file   Number of children: 3   Years of education: 16 years   Highest education level: Not on file  Occupational History   Occupation: Retired  Tobacco Use   Smoking status: Former    Current packs/day: 0.00    Average packs/day: 0.5 packs/day for 20.0 years (10.0 ttl pk-yrs)    Types: Cigarettes    Start date: 02/03/1956    Quit date: 02/03/1976    Years since quitting: 47.4   Smokeless tobacco: Never  Vaping Use   Vaping status: Never Used  Substance and Sexual Activity   Alcohol use: No   Drug use: No   Sexual activity: Not Currently    Birth control/protection: Post-menopausal  Other Topics Concern   Not on file  Social History Narrative   Lives at home with husband.   Right-handed.   No caffeine use.   retired   Teacher, early years/pre Strain: Low Risk  (08/07/2021)   Overall Financial Resource Strain (CARDIA)    Difficulty of Paying Living Expenses: Not hard at all  Food Insecurity: No Food Insecurity (08/31/2022)   Hunger Vital Sign    Worried About Running Out of Food in the Last Year: Never true    Ran Out of Food in the Last Year: Never true  Transportation Needs: No Transportation Needs (08/31/2022)   PRAPARE - Administrator, Civil Service (Medical): No    Lack of Transportation (Non-Medical): No  Physical Activity: Inactive (08/31/2022)   Exercise Vital Sign    Days of Exercise per Week: 0  days    Minutes of Exercise per Session: 0 min  Stress: No Stress Concern Present (08/31/2022)   [name] of Occupational Health - Occupational Stress Questionnaire  Feeling of Stress : Not at all  Social Connections: Moderately Isolated (08/07/2021)   Social Connection and Isolation Panel [NHANES]    Frequency of Communication with Friends and Family: More than three times a week    Frequency of Social Gatherings with Friends and Family: More than three times a week    Attends Religious Services: More than 4 times per year    Active Member of [employer] or Organizations: No    Attends Banker Meetings: Never    Marital Status: Widowed     Family History: The patient's family history includes Other in her father and mother. There is no history of Colon cancer.  ROS:   Please see the history of present illness.    All other systems reviewed and are negative.  EKGs/Labs/Other Studies Reviewed:    The following studies were reviewed today: .Marland KitchenEKG Interpretation Date/Time:  Friday July 15 2023 13:42:49 EST Ventricular Rate:  78 PR Interval:  178 QRS Duration:  92 QT Interval:  378 QTC Calculation: 430 R Axis:   -47  Text Interpretation: Normal sinus rhythm Left anterior fascicular block When compared with ECG of 14-Jan-2023 13:05, PREVIOUS ECG IS PRESENT Confirmed by Belva Crome [phone]) on 07/15/2023 2:03:40 PM     Recent Labs: 06/23/2023: ALT 12; BUN 12; Creatinine 0.56; Hemoglobin 13.7; Platelet Count 130; Potassium 4.4; Sodium 139 07/13/2023: TSH 1.03  Recent Lipid Panel    Component Value Date/Time   CHOL 118 07/21/2022 1105   TRIG 84.0 07/21/2022 1105   HDL 44.00 07/21/2022 1105   CHOLHDL 3 07/21/2022 1105   VLDL 16.8 07/21/2022 1105   LDLCALC 58 07/21/2022 1105    Physical Exam:    VS:  BP 138/60   Pulse 78   Ht 5\' 6"  (1.676 m)   Wt 203 lb 1.9 oz (92.1 kg)   SpO2 96%   BMI 32.78 kg/m     Wt Readings from Last 3 Encounters:  07/15/23  203 lb 1.9 oz (92.1 kg)  07/14/23 204 lb (92.5 kg)  07/13/23 202 lb (91.6 kg)     GEN: Patient is in no acute distress HEENT: Normal NECK: No JVD; No carotid bruits LYMPHATICS: No lymphadenopathy CARDIAC: Hear sounds regular, 2/6 systolic murmur at the apex. RESPIRATORY:  Clear to auscultation without rales, wheezing or rhonchi  ABDOMEN: Soft, non-tender, non-distended MUSCULOSKELETAL:  No edema; No deformity  SKIN: Warm and dry NEUROLOGIC:  Alert and oriented x 3 PSYCHIATRIC:  Normal affect   Signed, Garwin Brothers, MD  07/15/2023 2:20 PM    [facility]

## 2023-07-15 NOTE — Patient Instructions (Signed)
Medication Instructions:  Your physician recommends that you continue on your current medications as directed. Please refer to the Current Medication list given to you today.  *If you need a refill on your cardiac medications before your next appointment, please call your pharmacy*   Lab Work: None ordered If you have labs (blood work) drawn today and your tests are completely normal, you will receive your results only by: MyChart Message (if you have MyChart) OR A paper copy in the mail If you have any lab test that is abnormal or we need to change your treatment, we will call you to review the results.   Testing/Procedures:   [facility] Cardiovascular Imaging at [facility] [address] [place], [place] [address] Phone: [phone]    Please arrive 15 minutes prior to your appointment time for registration and insurance purposes.  The test will take approximately 3 to 4 hours to complete; you may bring reading material.  If someone comes with you to your appointment, they will need to remain in the main lobby due to limited space in the testing area.   How to prepare for your Myocardial Perfusion Test: Do not eat or drink 3 hours prior to your test, except you may have water. Do not consume products containing caffeine (regular or decaffeinated) 12 hours prior to your test. (ex: coffee, chocolate, sodas, tea). Do bring a list of your current medications with you.  If not listed below, you may take your medications as normal. Do wear comfortable clothes (no dresses or overalls) and walking shoes, tennis shoes preferred (No heels or open toe shoes are allowed). Do NOT wear cologne, perfume, aftershave, or lotions (deodorant is allowed). If these instructions are not followed, your test will have to be rescheduled.  If you cannot keep your appointment, please provide 24 hours notification to the Nuclear Lab, to avoid a possible $50 charge to your account. Please  report to [address], [address] for your test.  If you have questions or concerns about your appointment, you can call the Nuclear Lab at [phone].   If you cannot keep your appointment, please provide 24 hours notification to the Nuclear Lab, to avoid a possible $50 charge to your account.    Follow-Up: At [facility], you and your health needs are our priority.  As part of our continuing mission to provide you with exceptional heart care, we have created designated Provider Care Teams.  These Care Teams include your primary Cardiologist (physician) and Advanced Practice Providers (APPs -  Physician Assistants and Nurse Practitioners) who all work together to provide you with the care you need, when you need it.  We recommend signing up for the patient portal called "MyChart".  Sign up information is provided on this After Visit Summary.  MyChart is used to connect with patients for Virtual Visits (Telemedicine).  Patients are able to view lab/test results, encounter notes, upcoming appointments, etc.  Non-urgent messages can be sent to your provider as well.   To learn more about what you can do with MyChart, go to [email or web address].    Your next appointment:   12 month(s)  Provider:   Belva Crome, MD   Other Instructions  Cardiac Nuclear Scan A cardiac nuclear scan is a test that is done to check the flow of blood to your heart. It is done when you are resting and when you are exercising. The test looks for problems such as: Not enough blood  reaching a portion of the heart. The heart muscle not working as it should. You may need this test if you have: Heart disease. Lab results that are not normal. Had heart surgery or a balloon procedure to open up blocked arteries (angioplasty) or a small mesh tube (stent). Chest pain. Shortness of breath. Had a heart attack. In this test, a special dye (tracer) is put into your bloodstream. The tracer will travel  to your heart. A camera will then take pictures of your heart to see how the tracer moves through your heart. This test is usually done at a hospital and takes 2-4 hours. Tell a doctor about: Any allergies you have. All medicines you are taking, including vitamins, herbs, eye drops, creams, and over-the-counter medicines. Any bleeding problems you have. Any surgeries you have had. Any medical conditions you have. Whether you are pregnant or may be pregnant. Any history of asthma or long-term (chronic) lung disease. Any history of heart rhythm disorders or heart valve conditions. What are the risks? Your doctor will talk with you about risks. These may include: Serious chest pain and heart attack. This is only a risk if the stress portion of the test is done. Fast or uneven heartbeats (palpitations). A feeling of warmth in your chest. This feeling usually does not last long. Allergic reaction to the tracer. Shortness of breath or trouble breathing. What happens before the test? Ask your doctor about changing or stopping your normal medicines. Follow instructions from your doctor about what you cannot eat or drink. Remove your jewelry on the day of the test. Ask your doctor if you need to avoid nicotine or caffeine. What happens during the test? An IV tube will be inserted into one of your veins. Your doctor will give you a small amount of tracer through the IV tube. You will wait for 20-40 minutes while the tracer moves through your bloodstream. Your heart will be monitored with an electrocardiogram (ECG). You will lie down on an exam table. Pictures of your heart will be taken for about 15-20 minutes. You may also have a stress test. For this test, one of these things may be done: You will be asked to exercise on a treadmill or a stationary bike. You will be given medicines that will make your heart work harder. This is done if you are unable to exercise. When blood flow to your heart  has peaked, a tracer will again be given through the IV tube. After 20-40 minutes, you will get back on the exam table. More pictures will be taken of your heart. Depending on the tracer that is used, more pictures may need to be taken 3-4 hours later. Your IV tube will be removed when the test is over. The test may vary among doctors and hospitals. What happens after the test? Ask your doctor: Whether you can return to your normal schedule, including diet, activities, travel, and medicines. Whether you should drink more fluids. This will help to remove the tracer from your body. Ask your doctor, or the department that is doing the test: When will my results be ready? How will I get my results? What are my treatment options? What other tests do I need? What are my next steps? This information is not intended to replace advice given to you by your health care provider. Make sure you discuss any questions you have with your health care provider. Document Revised: 10/13/2021 Document Reviewed: 10/13/2021 Elsevier Patient Education  2023 [employer].

## 2023-07-18 ENCOUNTER — Encounter: Payer: Self-pay | Admitting: Gastroenterology

## 2023-07-19 ENCOUNTER — Ambulatory Visit: Payer: Medicare Other | Admitting: Physician Assistant

## 2023-07-20 ENCOUNTER — Telehealth: Payer: Self-pay | Admitting: Family Medicine

## 2023-07-20 NOTE — Telephone Encounter (Signed)
Copied from CRM [id number]. Topic: Medicare AWV >> Jul 20, 2023 10:33 AM Payton Doughty wrote: Reason for CRM: Called LVM 07/20/2023 to schedule AWV. Please schedule Virtual or Telehealth visits ONLY.   Verlee Rossetti; Care Guide Ambulatory Clinical Support [place] [facility] Direct Dial: [phone]

## 2023-07-22 ENCOUNTER — Telehealth (HOSPITAL_COMMUNITY): Payer: Self-pay | Admitting: *Deleted

## 2023-07-22 ENCOUNTER — Ambulatory Visit (HOSPITAL_COMMUNITY): Payer: Medicare Other

## 2023-07-22 NOTE — Telephone Encounter (Signed)
Left message with detailed instructions for MPI study.

## 2023-07-28 ENCOUNTER — Ambulatory Visit (HOSPITAL_COMMUNITY): Payer: Medicare Other | Attending: Cardiology

## 2023-07-28 ENCOUNTER — Telehealth: Payer: Self-pay

## 2023-07-28 DIAGNOSIS — R079 Chest pain, unspecified: Secondary | ICD-10-CM | POA: Diagnosis not present

## 2023-07-28 LAB — MYOCARDIAL PERFUSION IMAGING
Base ST Depression (mm): 0 mm
LV dias vol: 57 mL (ref 46–106)
LV sys vol: 19 mL
Nuc Stress EF: 67 %
Peak HR: 100 {beats}/min
Rest HR: 75 {beats}/min
Rest Nuclear Isotope Dose: 10.4 mCi
SDS: 2
SRS: 1
SSS: 3
ST Depression (mm): 0 mm
Stress Nuclear Isotope Dose: 31.9 mCi
TID: 1.09

## 2023-07-28 MED ORDER — REGADENOSON 0.4 MG/5ML IV SOLN
0.4000 mg | Freq: Once | INTRAVENOUS | Status: AC
  Administered 2023-07-28: 0.4 mg via INTRAVENOUS

## 2023-07-28 MED ORDER — TECHNETIUM TC 99M TETROFOSMIN IV KIT
10.4000 | PACK | Freq: Once | INTRAVENOUS | Status: AC | PRN
  Administered 2023-07-28: 10.4 via INTRAVENOUS

## 2023-07-28 MED ORDER — TECHNETIUM TC 99M TETROFOSMIN IV KIT
31.9000 | PACK | Freq: Once | INTRAVENOUS | Status: AC | PRN
  Administered 2023-07-28: 31.9 via INTRAVENOUS

## 2023-07-28 NOTE — Telephone Encounter (Signed)
-----   Message from Aundra Dubin Revankar sent at 07/28/2023  3:24 PM EST ----- The results of the study is unremarkable. Please inform patient. I will discuss in detail at next appointment. Cc  primary care/referring physician Garwin Brothers, MD 07/28/2023 3:24 PM

## 2023-07-28 NOTE — Telephone Encounter (Signed)
 Left message to return call

## 2023-07-29 DIAGNOSIS — H16203 Unspecified keratoconjunctivitis, bilateral: Secondary | ICD-10-CM | POA: Diagnosis not present

## 2023-08-01 ENCOUNTER — Telehealth: Payer: Self-pay | Admitting: Family Medicine

## 2023-08-01 ENCOUNTER — Telehealth: Payer: Self-pay | Admitting: Cardiology

## 2023-08-01 NOTE — Telephone Encounter (Signed)
 Dr. Dallas Schimke called in stating pt has received zio monitor but she needs help putting it on. She asked if pt can have a nurse visit at [facility] office set up to get assistance with this, please advise.

## 2023-08-01 NOTE — Telephone Encounter (Signed)
 Called pt back- she has the heart monitor but is not sure how to put it on and get this started. I called [facility] cardiology- they will have Dr Crisoforo Oxford nurse call her and set up a visit to get her monitor on.  I will also reach out to Dr Vic Blackbird directly

## 2023-08-01 NOTE — Progress Notes (Unsigned)
 Subjective:    Patient ID: Crystal Lee, female    DOB: May 06, 1934, 89 y.o.   MRN: [id number]  HPI: Crystal Lee is a 89 y.o. female who returns for follow up appointment for chronic pain and medication refill. She states her pain is located in her mid- lower back, bilateral lower extremities with tingling, bilateral hips and bilateral knee pain. She  rates her pain 5. Her current exercise regime is walking and performing stretching exercises.  Ms/ Spittler Morphine equivalent is 40 MME.   Oral Swab was Performed today.     Pain Inventory Average Pain 5 Pain Right Now 5 My pain is sharp, stabbing, tingling, and aching  In the last 24 hours, has pain interfered with the following? General activity 5 Relation with others 5 Enjoyment of life 5 What TIME of day is your pain at its worst? evening Sleep (in general) Fair  Pain is worse with: walking, bending, sitting, standing, and some activites Pain improves with: rest, heat/ice, and medication Relief from Meds: 3  Family History  Problem Relation Age of Onset   Other Mother        complications from flu   Other Father        unsure of cause   Colon cancer Neg Hx    Social History   Socioeconomic History   Marital status: Widowed    Spouse name: Not on file   Number of children: 3   Years of education: 16 years   Highest education level: Not on file  Occupational History   Occupation: Retired  Tobacco Use   Smoking status: Former    Current packs/day: 0.00    Average packs/day: 0.5 packs/day for 20.0 years (10.0 ttl pk-yrs)    Types: Cigarettes    Start date: 02/03/1956    Quit date: 02/03/1976    Years since quitting: 47.5   Smokeless tobacco: Never  Vaping Use   Vaping status: Never Used  Substance and Sexual Activity   Alcohol use: No   Drug use: No   Sexual activity: Not Currently    Birth control/protection: Post-menopausal  Other Topics Concern   Not on file  Social History Narrative   Lives at home with  husband.   Right-handed.   No caffeine use.   retired   Teacher, early years/pre Strain: Low Risk  (08/07/2021)   Overall Financial Resource Strain (CARDIA)    Difficulty of Paying Living Expenses: Not hard at all  Food Insecurity: No Food Insecurity (08/31/2022)   Hunger Vital Sign    Worried About Running Out of Food in the Last Year: Never true    Ran Out of Food in the Last Year: Never true  Transportation Needs: No Transportation Needs (08/31/2022)   PRAPARE - Administrator, Civil Service (Medical): No    Lack of Transportation (Non-Medical): No  Physical Activity: Inactive (08/31/2022)   Exercise Vital Sign    Days of Exercise per Week: 0 days    Minutes of Exercise per Session: 0 min  Stress: No Stress Concern Present (08/31/2022)   [name] of Occupational Health - Occupational Stress Questionnaire    Feeling of Stress : Not at all  Social Connections: Moderately Isolated (08/07/2021)   Social Connection and Isolation Panel [NHANES]    Frequency of Communication with Friends and Family: More than three times a week    Frequency of Social Gatherings with Friends and Family: More than three times a  week    Attends Religious Services: More than 4 times per year    Active Member of Clubs or Organizations: No    Attends Banker Meetings: Never    Marital Status: Widowed   Past Surgical History:  Procedure Laterality Date   87 HOUR PH STUDY N/A 06/21/2018   Procedure: 24 HOUR PH STUDY;  Surgeon: Shellia Cleverly, DO;  Location: [facility];  Service: Gastroenterology;  Laterality: N/A;  with impedance   ABDOMINAL HYSTERECTOMY  2010   ANAL RECTAL MANOMETRY N/A 09/19/2019   Procedure: ANO RECTAL MANOMETRY;  Surgeon: Napoleon Form, MD;  Location: [facility];  Service: Endoscopy;  Laterality: N/A;   APPENDECTOMY  1949   BACK SURGERY  1973   BIOPSY  06/10/2020   Procedure: BIOPSY;  Surgeon: Shellia Cleverly, DO;   [facility]: [facility];  Service: Gastroenterology;;  EGD and COLON   BREAST SURGERY     CATARACT EXTRACTION Bilateral 2011   CHOLECYSTECTOMY     COLON RESECTION N/A 06/12/2020   Procedure: LAPAROSCOPIC COLON RESECTION;  Surgeon: Abigail Miyamoto, MD;  Location: [facility];  Service: General;  Laterality: N/A;   COLONOSCOPY WITH PROPOFOL N/A 06/10/2020   Procedure: COLONOSCOPY WITH PROPOFOL;  Surgeon: Shellia Cleverly, DO;  [facility];  Service: Gastroenterology;  Laterality: N/A;   ESOPHAGEAL MANOMETRY N/A 06/21/2018   Procedure: ESOPHAGEAL MANOMETRY (EM);  Surgeon: Shellia Cleverly, DO;  [facility];  Service: Gastroenterology;  Laterality: N/A;   ESOPHAGOGASTRODUODENOSCOPY (EGD) WITH PROPOFOL N/A 06/10/2020   Procedure: ESOPHAGOGASTRODUODENOSCOPY (EGD) WITH PROPOFOL;  Surgeon: Shellia Cleverly, DO;  [facility];  Service: Gastroenterology;  Laterality: N/A;   GALLBLADDER SURGERY  2015   INCISIONAL HERNIA REPAIR N/A 09/16/2021   Procedure: OPEN INCISIONAL HERNIA REPAIR WITH MESH;  Surgeon: Abigail Miyamoto, MD;  Location: [facility];  Service: General;  Laterality: N/A;   JOINT REPLACEMENT     RT TOTAL HIP / RT TOTAL KNEE   MASTECTOMY  1998   BILATERAL    PH IMPEDANCE STUDY  06/21/2018   Procedure: PH IMPEDANCE STUDY;  Surgeon: Shellia Cleverly, DO;  [facility];  Service: Gastroenterology;;   POLYPECTOMY  06/10/2020   Procedure: POLYPECTOMY;  Surgeon: Shellia Cleverly, DO;  [facility];  Service: Gastroenterology;;   SKIN CANCER EXCISION  2016   RT SIDE OF NOSE   SUBMUCOSAL TATTOO INJECTION  06/10/2020   Procedure: SUBMUCOSAL TATTOO INJECTION;  Surgeon: Shellia Cleverly, DO;  [facility];  Service: Gastroenterology;;   TONSILLECTOMY  1953   TOTAL HIP ARTHROPLASTY  2010   RIGHT   TOTAL HIP ARTHROPLASTY Left 05/09/2015   Procedure: LEFT TOTAL HIP ARTHROPLASTY ANTERIOR APPROACH;  Surgeon: Ollen Gross, MD;  [facility];  Service: Orthopedics;  Laterality: Left;   TOTAL KNEE ARTHROPLASTY  2001   TUMOR REMOVAL  2012   ABDOMINAL - NON CANCEROUS   Past Surgical History:  Procedure Laterality Date   56 HOUR PH STUDY N/A 06/21/2018   Procedure: 24 HOUR PH STUDY;  Surgeon: Shellia Cleverly, DO;  [facility];  Service: Gastroenterology;  Laterality: N/A;  with impedance   ABDOMINAL HYSTERECTOMY  2010   ANAL RECTAL MANOMETRY N/A 09/19/2019   Procedure: ANO RECTAL MANOMETRY;  Surgeon: Napoleon Form, MD;  [facility];  Service: Endoscopy;  Laterality: N/A;   APPENDECTOMY  1949   BACK SURGERY  1973   BIOPSY  06/10/2020   Procedure: BIOPSY;  Surgeon:  Cirigliano, Vito V, DO;  [facility];  Service: Gastroenterology;;  EGD and COLON   BREAST SURGERY     CATARACT EXTRACTION Bilateral 2011   CHOLECYSTECTOMY     COLON RESECTION N/A 06/12/2020   Procedure: LAPAROSCOPIC COLON RESECTION;  Surgeon: Abigail Miyamoto, MD;  [facility];  Service: General;  Laterality: N/A;   COLONOSCOPY WITH PROPOFOL N/A 06/10/2020   Procedure: COLONOSCOPY WITH PROPOFOL;  Surgeon: Shellia Cleverly, DO;  [facility];  Service: Gastroenterology;  Laterality: N/A;   ESOPHAGEAL MANOMETRY N/A 06/21/2018   Procedure: ESOPHAGEAL MANOMETRY (EM);  Surgeon: Shellia Cleverly, DO;  [facility];  Service: Gastroenterology;  Laterality: N/A;   ESOPHAGOGASTRODUODENOSCOPY (EGD) WITH PROPOFOL N/A 06/10/2020   Procedure: ESOPHAGOGASTRODUODENOSCOPY (EGD) WITH PROPOFOL;  Surgeon: Shellia Cleverly, DO;  [facility];  Service: Gastroenterology;  Laterality: N/A;   GALLBLADDER SURGERY  2015   INCISIONAL HERNIA REPAIR N/A 09/16/2021   Procedure: OPEN INCISIONAL HERNIA REPAIR WITH MESH;  Surgeon: Abigail Miyamoto, MD;  [facility];  Service: General;  Laterality: N/A;   JOINT REPLACEMENT     RT TOTAL HIP / RT TOTAL KNEE   MASTECTOMY  1998   BILATERAL    PH IMPEDANCE STUDY   06/21/2018   Procedure: PH IMPEDANCE STUDY;  Surgeon: Shellia Cleverly, DO;  [facility];  Service: Gastroenterology;;   POLYPECTOMY  06/10/2020   Procedure: POLYPECTOMY;  Surgeon: Shellia Cleverly, DO;  [facility];  Service: Gastroenterology;;   SKIN CANCER EXCISION  2016   RT SIDE OF NOSE   SUBMUCOSAL TATTOO INJECTION  06/10/2020   Procedure: SUBMUCOSAL TATTOO INJECTION;  Surgeon: Shellia Cleverly, DO;  [facility];  Service: Gastroenterology;;   TONSILLECTOMY  1953   TOTAL HIP ARTHROPLASTY  2010   RIGHT   TOTAL HIP ARTHROPLASTY Left 05/09/2015   Procedure: LEFT TOTAL HIP ARTHROPLASTY ANTERIOR APPROACH;  Surgeon: Ollen Gross, MD;  [facility];  Service: Orthopedics;  Laterality: Left;   TOTAL KNEE ARTHROPLASTY  2001   TUMOR REMOVAL  2012   ABDOMINAL - NON CANCEROUS   Past Medical History:  Diagnosis Date   Acquired trigger finger of right little finger 01/15/2019   Acute blood loss anemia 06/08/2020   Acute lower GI bleeding 06/09/2020   Acute pain of right wrist 03/28/2019   Adenomatous polyp of sigmoid colon    Aftercare 08/17/2018   Arthritis    Cancer (HCC)    HX BREAST CANCER/ SKIN CANCER   Carpal tunnel syndrome of right wrist 01/15/2019   Colon cancer (HCC)    Colonic mass    Complication of anesthesia    N/V WITH MORPHINE   Constipation    Difficulty sleeping    Diverticulosis    Diverticulosis of colon without hemorrhage    Dyslipidemia 02/02/2018   Encounter for orthopedic follow-up care 06/18/2019   Fracture of superior pubic ramus (HCC) 06/16/2018   Fractured hip (HCC)    LEFT - AUG 2016   Gait abnormality 02/09/2017   Gastric polyps    GERD (gastroesophageal reflux disease)    Heartburn    History of right knee joint replacement 10/17/2017   HTN (hypertension) 06/08/2020   Hyperlipidemia    Hypertension    Incisional hernia 09/16/2021   Low back pain 02/09/2017   Lower GI bleed 06/08/2020   Lumbar  post-laminectomy syndrome 07/31/2018   Malignant neoplasm of descending colon (HCC)    Melanoma (HCC)    Memory impairment 07/12/2022  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 behavioral disturbance, psychotic disturbance, mood disturbance, or anxiety (HCC) 05/05/2021   Neuropathy    Nocturia    OA (osteoarthritis) of hip 05/09/2015   Osteopenia    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   Pain 05/05/2021   Pain in both lower extremities 08/02/2017   Pain in right hand 06/10/2017   Pain of left hand 04/20/2018   Paresthesia 02/09/2017   PONV (postoperative nausea and vomiting)    PT STATES MORPHINE CAUSED N/V   Primary osteoarthritis of left knee 10/17/2017   Radial styloid tenosynovitis of left hand 05/16/2018   Rosacea    Sleep apnea    Stage 1 breast cancer, ER+, right (HCC) 07/04/2017   Stroke-like symptoms 03/25/2021   Tenosynovitis, wrist 03/28/2019   Trigger finger of right hand 06/10/2017   Trochanteric bursitis of left hip 12/23/2017   Urinary tract infection 04/20/2019   BP 122/74   Pulse 91   Ht 5\' 6"  (1.676 m)   Wt 204 lb 9.6 oz (92.8 kg)   SpO2 94%   BMI 33.02 kg/m   Opioid Risk Score:   Fall Risk Score:  `1  Depression screen [facility] 2/9     08/02/2023   11:33 AM 05/30/2023    1:26 PM 04/11/2023   11:04 AM 02/14/2023   11:05 AM 01/14/2023   11:07 AM 12/20/2022   11:43 AM 11/15/2022   11:26 AM  Depression screen PHQ 2/9  Decreased Interest 0 1 0 0 0 0 0  Down, Depressed, Hopeless 0 0 0 0 0 0 0  PHQ - 2 Score 0 1 0 0 0 0 0     Review of Systems  Constitutional: Negative.   HENT: Negative.    Eyes: Negative.   Respiratory: Negative.    Gastrointestinal: Negative.   Endocrine: Negative.   Genitourinary: Negative.   Musculoskeletal:  Positive for arthralgias, back pain and myalgias.  Skin: Negative.   Neurological: Negative.   Hematological: Negative.   Psychiatric/Behavioral: Negative.    All other systems reviewed and are negative.      Objective:    Physical Exam Vitals and nursing note reviewed.  Constitutional:      Appearance: Normal appearance.  Cardiovascular:     Rate and Rhythm: Normal rate and regular rhythm.     Pulses: Normal pulses.     Heart sounds: Normal heart sounds.  Pulmonary:     Effort: Pulmonary effort is normal.     Breath sounds: Normal breath sounds.  Musculoskeletal:     Comments: Normal Muscle Bulk and Muscle Testing Reveals:  Upper Extremities: Full ROM and Muscle Strength 5/5  Lumbar Paraspinal Tenderness: L-3-L-5 Lower Extremities: Full ROM and Muscle Strength 5/5 Arises from Table slowly using walker for support Narrow Based  Gait     Skin:    General: Skin is warm and dry.  Neurological:     Mental Status: She is alert and oriented to person, place, and time.  Psychiatric:        Mood and Affect: Mood normal.        Behavior: Behavior normal.         Assessment & Plan:  Chronic Bilateral Leg pain: Paresthesia: Continue to Monitor. 08/02/2023 2. Paresthesia Salli Real Radiculitis: Ms. Verley has weaned herself off the Lyrica due to daytime drowsiness. We will continue to monitor. 08/02/2023 3. Pain of Left Wrist/ : No complaints today. S/P Carpal Tunnel Release on 06/03/2017 by Dr. Danielle Dess. Dr. Danielle Dess Following. 0304/2025. 4. Fracture  of superior pubic ramus.  Dr. Lequita Halt Following.  Continue to monitor. 08/02/2023. 5. Bilateral Knee OA: Continue Voltaren Gel.  Continue to monitor. Orthopedist following. 08/02/2023 6. Polyarthralgia: Continue to alternate with heat and ice therapy. Continue current medication regime. Continue to monitor. 08/02/2023. 7. Chronic Pain Syndrome: Refilled::Hydrocodone  10/325 one tablet 4 times a day as needed for pain #120. We will continue the opioid monitoring program, this consists of regular clinic visits, examinations, urine drug screen, pill counts as well as use of [place] Controlled Substance Reporting system. A 12 month History has been reviewed on the  [place] Controlled Substance Reporting System on 08/02/2023. 8. Lumbar Compression Fracture L2 and L3:  Ms. Gitlin refused physical therapy. Continue with rest/ heat therapy. Continue to Monitor 08/02/2023. 9. Muscle Spasm: Continue  Flexeril 5 mg at HS. Continue to monitor. 08/02/2023 10. Right Shoulder Pain: No complaints today. Continue HEP as Tolerated. Alternate Ice and Heat Therapy. 08/02/2023  11. Bilateral  Greater Trochanter Bursitis:  Continue to Monitor. Continue HEP as Tolerated. 08/02/2023     F/U in 1 Month

## 2023-08-02 ENCOUNTER — Encounter: Payer: Self-pay | Admitting: Registered Nurse

## 2023-08-02 ENCOUNTER — Encounter: Payer: Medicare Other | Attending: Physical Medicine & Rehabilitation | Admitting: Registered Nurse

## 2023-08-02 VITALS — BP 122/74 | HR 91 | Ht 66.0 in | Wt 204.6 lb

## 2023-08-02 DIAGNOSIS — M546 Pain in thoracic spine: Secondary | ICD-10-CM | POA: Diagnosis not present

## 2023-08-02 DIAGNOSIS — Z79891 Long term (current) use of opiate analgesic: Secondary | ICD-10-CM | POA: Insufficient documentation

## 2023-08-02 DIAGNOSIS — M7062 Trochanteric bursitis, left hip: Secondary | ICD-10-CM | POA: Diagnosis not present

## 2023-08-02 DIAGNOSIS — G8929 Other chronic pain: Secondary | ICD-10-CM | POA: Diagnosis not present

## 2023-08-02 DIAGNOSIS — M545 Low back pain, unspecified: Secondary | ICD-10-CM | POA: Insufficient documentation

## 2023-08-02 DIAGNOSIS — Z5181 Encounter for therapeutic drug level monitoring: Secondary | ICD-10-CM | POA: Insufficient documentation

## 2023-08-02 DIAGNOSIS — M2550 Pain in unspecified joint: Secondary | ICD-10-CM | POA: Insufficient documentation

## 2023-08-02 DIAGNOSIS — M170 Bilateral primary osteoarthritis of knee: Secondary | ICD-10-CM | POA: Insufficient documentation

## 2023-08-02 DIAGNOSIS — G894 Chronic pain syndrome: Secondary | ICD-10-CM | POA: Insufficient documentation

## 2023-08-02 DIAGNOSIS — M7061 Trochanteric bursitis, right hip: Secondary | ICD-10-CM | POA: Insufficient documentation

## 2023-08-02 DIAGNOSIS — R202 Paresthesia of skin: Secondary | ICD-10-CM | POA: Diagnosis not present

## 2023-08-02 MED ORDER — HYDROCODONE-ACETAMINOPHEN 10-325 MG PO TABS: 1.0000 | ORAL_TABLET | Freq: Four times a day (QID) | ORAL | 0 refills | Status: DC | PRN

## 2023-08-03 ENCOUNTER — Ambulatory Visit

## 2023-08-03 DIAGNOSIS — R002 Palpitations: Secondary | ICD-10-CM | POA: Diagnosis not present

## 2023-08-03 NOTE — Telephone Encounter (Signed)
 Left message for pt to call us back about coming in to have zio applied in the office or we could try to walk her through it over the phone. Gave [facility] number and number of phone in pod D

## 2023-08-04 NOTE — Telephone Encounter (Signed)
 Left another message on pt's phone to call [facility] office about coming in to have Zio applied.

## 2023-08-08 LAB — DRUG TOX MONITOR 1 W/CONF, ORAL FLD
Amphetamines: NEGATIVE ng/mL (ref <10)
Barbiturates: NEGATIVE ng/mL (ref <10)
Benzodiazepines: NEGATIVE ng/mL (ref <0.50)
Buprenorphine: NEGATIVE ng/mL (ref <0.10)
Cocaine: NEGATIVE ng/mL (ref <5.0)
Codeine: NEGATIVE ng/mL (ref <2.5)
Dihydrocodeine: 33.1 ng/mL — ABNORMAL HIGH (ref <2.5)
Fentanyl: NEGATIVE ng/mL (ref <0.10)
Heroin Metabolite: NEGATIVE ng/mL (ref <1.0)
Hydrocodone: 214.9 ng/mL — ABNORMAL HIGH (ref <2.5)
Hydromorphone: NEGATIVE ng/mL (ref <2.5)
MARIJUANA: NEGATIVE ng/mL (ref <2.5)
MDMA: NEGATIVE ng/mL (ref <10)
Meprobamate: NEGATIVE ng/mL (ref <2.5)
Methadone: NEGATIVE ng/mL (ref <5.0)
Morphine: NEGATIVE ng/mL (ref <2.5)
Nicotine Metabolite: NEGATIVE ng/mL (ref <5.0)
Norhydrocodone: 11.3 ng/mL — ABNORMAL HIGH (ref <2.5)
Noroxycodone: NEGATIVE ng/mL (ref <2.5)
Opiates: POSITIVE ng/mL — AB (ref <2.5)
Oxycodone: NEGATIVE ng/mL (ref <2.5)
Oxymorphone: NEGATIVE ng/mL (ref <2.5)
Phencyclidine: NEGATIVE ng/mL (ref <10)
Tapentadol: NEGATIVE ng/mL (ref <5.0)
Tramadol: NEGATIVE ng/mL (ref <5.0)
Zolpidem: NEGATIVE ng/mL (ref <5.0)

## 2023-08-08 LAB — DRUG TOX ALC METAB W/CON, ORAL FLD: Alcohol Metabolite: NEGATIVE ng/mL (ref <25)

## 2023-08-08 NOTE — Telephone Encounter (Signed)
 Spoke with pt who stated that monitor has been applied and come off in 3 days. Pt verbalized understanding and had no further questions.

## 2023-08-10 ENCOUNTER — Ambulatory Visit (INDEPENDENT_AMBULATORY_CARE_PROVIDER_SITE_OTHER): Payer: TRICARE For Life (TFL) | Admitting: Obstetrics and Gynecology

## 2023-08-10 ENCOUNTER — Encounter: Payer: Self-pay | Admitting: Obstetrics and Gynecology

## 2023-08-10 VITALS — BP 149/78 | HR 87

## 2023-08-10 DIAGNOSIS — R82998 Other abnormal findings in urine: Secondary | ICD-10-CM | POA: Diagnosis not present

## 2023-08-10 DIAGNOSIS — R300 Dysuria: Secondary | ICD-10-CM | POA: Diagnosis not present

## 2023-08-10 DIAGNOSIS — R319 Hematuria, unspecified: Secondary | ICD-10-CM | POA: Diagnosis not present

## 2023-08-10 DIAGNOSIS — R3915 Urgency of urination: Secondary | ICD-10-CM | POA: Diagnosis not present

## 2023-08-10 DIAGNOSIS — R350 Frequency of micturition: Secondary | ICD-10-CM | POA: Diagnosis not present

## 2023-08-10 DIAGNOSIS — N3281 Overactive bladder: Secondary | ICD-10-CM

## 2023-08-10 LAB — POCT URINALYSIS DIPSTICK
Bilirubin, UA: NEGATIVE
Blood, UA: NEGATIVE
Glucose, UA: NEGATIVE
Ketones, UA: NEGATIVE
Nitrite, UA: NEGATIVE
Protein, UA: NEGATIVE
Spec Grav, UA: 1.015 (ref 1.010–1.025)
Urobilinogen, UA: 0.2 U/dL
pH, UA: 7 (ref 5.0–8.0)

## 2023-08-10 MED ORDER — CEPHALEXIN 500 MG PO CAPS: 500.0000 mg | ORAL_CAPSULE | Freq: Two times a day (BID) | ORAL | 0 refills | Status: AC

## 2023-08-10 MED ORDER — MIRABEGRON ER 50 MG PO TB24: 50.0000 mg | ORAL_TABLET | Freq: Every day | ORAL | 11 refills | Status: AC

## 2023-08-10 NOTE — Patient Instructions (Addendum)
 Stop taking bactrim temporarily and start cephalexin (keflex) twice a day for 2 days while we get a urine culture.  Restart the bactrim- 1 full pill a day unless we call and tell you otherwise.  Continue vaginal estrogen cream twice a week Restart myrbetriq 50mg - a new prescription was sent to the pharmacy.

## 2023-08-10 NOTE — Progress Notes (Unsigned)
 [facility] Return Visit  SUBJECTIVE  History of Present Illness: Crystal Lee is a 89 y.o. female seen in follow-up for recurrent UTI and OAB. She underwent cystoscopy on 06/25/23 which was normal  Has decreased energy and feels miserable for the least few days. Her urine was cloudy and has some lower abdominal pain. She has increased urgency. She did have covid in July and does not feel the same since then. Has been taking the bactrim 0.5 pill  for UTI prophylaxis daily. And using estrogen cream twice a week.   She is getting up at least 5 times per night to urinate. Was previously controlled on Myrbetriq until December and was switched to Pettibone. She did not feel the gemtesa was helping her.   Past Medical History: Patient  has a past medical history of Acquired trigger finger of right little finger (01/15/2019), Acute blood loss anemia (06/08/2020), Acute lower GI bleeding (06/09/2020), Acute pain of right wrist (03/28/2019), Adenomatous polyp of sigmoid colon, Aftercare (08/17/2018), Arthritis, Cancer (HCC), Carpal tunnel syndrome of right wrist (01/15/2019), Colon cancer (HCC), Colonic mass, Complication of anesthesia, Constipation, Difficulty sleeping, Diverticulosis, Diverticulosis of colon without hemorrhage, Dyslipidemia (02/02/2018), Encounter for orthopedic follow-up care (06/18/2019), Fracture of superior pubic ramus (HCC) (06/16/2018), Fractured hip (HCC), Gait abnormality (02/09/2017), Gastric polyps, GERD (gastroesophageal reflux disease), Heartburn, History of right knee joint replacement (10/17/2017), HTN (hypertension) (06/08/2020), Hyperlipidemia, Hypertension, Incisional hernia (09/16/2021), Low back pain (02/09/2017), Lower GI bleed (06/08/2020), Lumbar post-laminectomy syndrome (07/31/2018), Malignant neoplasm of descending colon (HCC), Melanoma (HCC), Memory impairment (07/12/2022), 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 behavioral disturbance, psychotic disturbance, mood  disturbance, or anxiety (HCC) (05/05/2021), Neuropathy, Nocturia, OA (osteoarthritis) of hip (05/09/2015), Osteopenia, 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   Past Surgical History: She  has a past surgical history that includes Breast surgery; Cholecystectomy; Total hip arthroplasty (2010); Mastectomy (1998); Joint replacement; Total knee arthroplasty (2001); Back surgery (1973); Skin cancer excision (2016); Appendectomy (1949); Tumor removal (2012); Total hip arthroplasty (Left, 05/09/2015); Abdominal hysterectomy (2010); Gallbladder surgery (2015); Tonsillectomy (1953); Cataract extraction (Bilateral, 2011); Esophageal manometry (N/A, 06/21/2018); 24 hour ph study (N/A, 06/21/2018); PH impedance study (06/21/2018); Anal Rectal manometry (N/A, 09/19/2019); Colonoscopy with propofol (N/A, 06/10/2020); Esophagogastroduodenoscopy (egd) with propofol (N/A, 06/10/2020); biopsy (06/10/2020); Submucosal tattoo injection (06/10/2020); polypectomy (06/10/2020); Colon resection (N/A, 06/12/2020); and Incisional hernia repair (N/A, 09/16/2021).   Medications: She has a current medication list which includes the following prescription(s): cephalexin, mirabegron er, aspirin ec, atorvastatin, bacitracin, cyclobenzaprine, estradiol, hydrocodone-acetaminophen, ipratropium, losartan, memantine, multivitamin adults, naloxegol oxalate, ondansetron,  sulfamethoxazole-trimethoprim, valacyclovir, and cyanocobalamin, and the following Facility-Administered Medications: lidocaine.   Allergies: Patient is allergic to morphine, propoxyphene, donepezil, and tape.   Social History: Patient  reports that she quit smoking about 47 years ago. Her smoking use included cigarettes. She started smoking about 67 years ago. She has a 10 pack-year smoking history. She has never used smokeless tobacco. She reports that she does not drink alcohol and does not use drugs.     OBJECTIVE     Physical Exam: Vitals:   08/10/23 1105  BP: (!) 149/78  Pulse: 87   Gen: No apparent distress, A&O x 3.  Detailed Urogynecologic Evaluation:  Deferred.    Results for orders placed or performed in visit on 08/10/23  POCT Urinalysis Dipstick   Collection Time: 08/10/23 11:33 AM  Result Value Ref Range   Color, UA Yellow    Clarity, UA Cloudy    Glucose, UA Negative Negative   Bilirubin, UA Negative    Ketones, UA Negative    Spec Grav, UA 1.015 1.010 - 1.025   Blood, UA Negative    pH, UA 7.0 5.0 - 8.0   Protein, UA Negative Negative   Urobilinogen, UA 0.2 0.2 or 1.0 E.U./dL   Nitrite, UA Negative    Leukocytes, UA Moderate (2+) (A) Negative   Appearance     Odor      ASSESSMENT AND PLAN    Crystal Lee is a 89 y.o. with:  1. Leukocytes in urine   2. Urinary frequency   3. Overactive bladder   4. Dysuria    - Moderate leukocytes on POC urine. Stop taking bactrim temporarily and start cephalexin (keflex) twice a day for 2 days while we get a urine culture. Will send for pathnostics molecular testing since last urine culture showed multiple species. Then restart the bactrim- 1 full pill a day after keflex is complete. Previously was hospitalized for psuedomonas UTI.  - Continue vaginal estrogen cream twice a week - Restart myrbetriq 50mg - a new prescription was sent to the pharmacy.   Follow up 6 weeks or sooner if needed  Marguerita Beards,  MD

## 2023-08-12 ENCOUNTER — Telehealth: Payer: Self-pay

## 2023-08-16 MED ORDER — NITROFURANTOIN MONOHYD MACRO 100 MG PO CAPS: 100.0000 mg | ORAL_CAPSULE | Freq: Two times a day (BID) | ORAL | 0 refills | Status: AC

## 2023-08-16 MED ORDER — FOSFOMYCIN TROMETHAMINE 3 G PO PACK: PACK | ORAL | 5 refills | Status: DC

## 2023-08-16 NOTE — Addendum Note (Signed)
 Addended by: Marguerita Beards on: 08/16/2023 04:32 PM   Modules accepted: Orders

## 2023-08-17 DIAGNOSIS — R002 Palpitations: Secondary | ICD-10-CM | POA: Diagnosis not present

## 2023-08-17 NOTE — Progress Notes (Signed)
 Pt was contacted and notified of the message from Dr Florian Buff. Pt has no additional questions.

## 2023-08-19 DIAGNOSIS — H1820 Unspecified corneal edema: Secondary | ICD-10-CM | POA: Diagnosis not present

## 2023-08-19 DIAGNOSIS — H04123 Dry eye syndrome of bilateral lacrimal glands: Secondary | ICD-10-CM | POA: Diagnosis not present

## 2023-08-19 DIAGNOSIS — H10503 Unspecified blepharoconjunctivitis, bilateral: Secondary | ICD-10-CM | POA: Diagnosis not present

## 2023-08-19 NOTE — Telephone Encounter (Signed)
 error

## 2023-08-31 NOTE — Progress Notes (Unsigned)
 Subjective:    Patient ID: Crystal Lee, female    DOB: 01/13/1934, 89 y.o.   MRN: [id number]  HPI: Crystal Lee is a 89 y.o. female who returns for follow up appointment for chronic pain and medication refill. She states her pain is located in her lower back, bilateral lower extremities with tingling, bilateral hips, and bilateral knee pain. She also reports generalized joint pain. She rates her pain 5. Her current exercise regime is walking and performing stretching exercises.  Crystal Lee Morphine equivalent is 40.00 MME.   Last Oral Swab was Performed on 08/02/2023, it was consistent.     Pain Inventory Average Pain 5 Pain Right Now 5 My pain is sharp, stabbing, and aching  In the last 24 hours, has pain interfered with the following? General activity 3 Relation with others 3 Enjoyment of life 3 What TIME of day is your pain at its worst? evening Sleep (in general) NA  Pain is worse with: walking, bending, and standing Pain improves with: rest, heat/ice, and medication Relief from Meds: 5  Family History  Problem Relation Age of Onset   Other Mother        complications from flu   Other Father        unsure of cause   Colon cancer Neg Hx    Social History   Socioeconomic History   Marital status: Widowed    Spouse name: Not on file   Number of children: 3   Years of education: 16 years   Highest education level: Not on file  Occupational History   Occupation: Retired  Tobacco Use   Smoking status: Former    Current packs/day: 0.00    Average packs/day: 0.5 packs/day for 20.0 years (10.0 ttl pk-yrs)    Types: Cigarettes    Start date: 02/03/1956    Quit date: 02/03/1976    Years since quitting: 47.6   Smokeless tobacco: Never  Vaping Use   Vaping status: Never Used  Substance and Sexual Activity   Alcohol use: No   Drug use: No   Sexual activity: Not Currently    Birth control/protection: Post-menopausal  Other Topics Concern   Not on file  Social History  Narrative   Lives at home with husband.   Right-handed.   No caffeine use.   retired   Teacher, early years/pre Strain: Low Risk  (08/07/2021)   Overall Financial Resource Strain (CARDIA)    Difficulty of Paying Living Expenses: Not hard at all  Food Insecurity: No Food Insecurity (08/31/2022)   Hunger Vital Sign    Worried About Running Out of Food in the Last Year: Never true    Ran Out of Food in the Last Year: Never true  Transportation Needs: No Transportation Needs (08/31/2022)   PRAPARE - Administrator, Civil Service (Medical): No    Lack of Transportation (Non-Medical): No  Physical Activity: Inactive (08/31/2022)   Exercise Vital Sign    Days of Exercise per Week: 0 days    Minutes of Exercise per Session: 0 min  Stress: No Stress Concern Present (08/31/2022)   [name] of Occupational Health - Occupational Stress Questionnaire    Feeling of Stress : Not at all  Social Connections: Moderately Isolated (08/07/2021)   Social Connection and Isolation Panel [NHANES]    Frequency of Communication with Friends and Family: More than three times a week    Frequency of Social Gatherings with Friends and Family:  More than three times a week    Attends Religious Services: More than 4 times per year    Active Member of Clubs or Organizations: No    Attends Banker Meetings: Never    Marital Status: Widowed   Past Surgical History:  Procedure Laterality Date   62 HOUR PH STUDY N/A 06/21/2018   Procedure: 24 HOUR PH STUDY;  Surgeon: Shellia Cleverly, DO;  Location: [facility];  Service: Gastroenterology;  Laterality: N/A;  with impedance   ABDOMINAL HYSTERECTOMY  2010   ANAL RECTAL MANOMETRY N/A 09/19/2019   Procedure: ANO RECTAL MANOMETRY;  Surgeon: Napoleon Form, MD;  Location: [facility];  Service: Endoscopy;  Laterality: N/A;   APPENDECTOMY  1949   BACK SURGERY  1973   BIOPSY  06/10/2020   Procedure: BIOPSY;  Surgeon:  Shellia Cleverly, DO;  Location: [facility];  Service: Gastroenterology;;  EGD and COLON   BREAST SURGERY     CATARACT EXTRACTION Bilateral 2011   CHOLECYSTECTOMY     COLON RESECTION N/A 06/12/2020   Procedure: LAPAROSCOPIC COLON RESECTION;  Surgeon: Abigail Miyamoto, MD;  Location: [facility];  Service: General;  Laterality: N/A;   COLONOSCOPY WITH PROPOFOL N/A 06/10/2020   Procedure: COLONOSCOPY WITH PROPOFOL;  Surgeon: Shellia Cleverly, DO;  Location: [facility];  Service: Gastroenterology;  Laterality: N/A;   ESOPHAGEAL MANOMETRY N/A 06/21/2018   Procedure: ESOPHAGEAL MANOMETRY (EM);  Surgeon: Shellia Cleverly, DO;  Location: [facility];  Service: Gastroenterology;  Laterality: N/A;   ESOPHAGOGASTRODUODENOSCOPY (EGD) WITH PROPOFOL N/A 06/10/2020   Procedure: ESOPHAGOGASTRODUODENOSCOPY (EGD) WITH PROPOFOL;  Surgeon: Shellia Cleverly, DO;  Location: [facility];  Service: Gastroenterology;  Laterality: N/A;   GALLBLADDER SURGERY  2015   INCISIONAL HERNIA REPAIR N/A 09/16/2021   Procedure: OPEN INCISIONAL HERNIA REPAIR WITH MESH;  Surgeon: Abigail Miyamoto, MD;  Location: [facility];  Service: General;  Laterality: N/A;   JOINT REPLACEMENT     RT TOTAL HIP / RT TOTAL KNEE   MASTECTOMY  1998   BILATERAL    PH IMPEDANCE STUDY  06/21/2018   Procedure: PH IMPEDANCE STUDY;  Surgeon: Shellia Cleverly, DO;  Location: [facility];  Service: Gastroenterology;;   POLYPECTOMY  06/10/2020   Procedure: POLYPECTOMY;  Surgeon: Shellia Cleverly, DO;  Location: [facility];  Service: Gastroenterology;;   SKIN CANCER EXCISION  2016   RT SIDE OF NOSE   SUBMUCOSAL TATTOO INJECTION  06/10/2020   Procedure: SUBMUCOSAL TATTOO INJECTION;  Surgeon: Shellia Cleverly, DO;  Location: [facility];  Service: Gastroenterology;;   TONSILLECTOMY  1953   TOTAL HIP ARTHROPLASTY  2010   RIGHT   TOTAL HIP ARTHROPLASTY Left 05/09/2015   Procedure: LEFT TOTAL HIP ARTHROPLASTY ANTERIOR APPROACH;  Surgeon: Ollen Gross, MD;  Location: [facility];  Service: Orthopedics;  Laterality: Left;   TOTAL KNEE ARTHROPLASTY  2001   TUMOR REMOVAL  2012   ABDOMINAL - NON CANCEROUS   Past Surgical History:  Procedure Laterality Date   68 HOUR PH STUDY N/A 06/21/2018   Procedure: 24 HOUR PH STUDY;  Surgeon: Shellia Cleverly, DO;  Location: [facility];  Service: Gastroenterology;  Laterality: N/A;  with impedance   ABDOMINAL HYSTERECTOMY  2010   ANAL RECTAL MANOMETRY N/A 09/19/2019   Procedure: ANO RECTAL MANOMETRY;  Surgeon: Napoleon Form, MD;  Location: [facility];  Service: Endoscopy;  Laterality: N/A;   APPENDECTOMY  1949   BACK SURGERY  1973   BIOPSY  06/10/2020  Procedure: BIOPSY;  Surgeon: Shellia Cleverly, DO;  Location: [facility];  Service: Gastroenterology;;  EGD and COLON   BREAST SURGERY     CATARACT EXTRACTION Bilateral 2011   CHOLECYSTECTOMY     COLON RESECTION N/A 06/12/2020   Procedure: LAPAROSCOPIC COLON RESECTION;  Surgeon: Abigail Miyamoto, MD;  Location: [facility];  Service: General;  Laterality: N/A;   COLONOSCOPY WITH PROPOFOL N/A 06/10/2020   Procedure: COLONOSCOPY WITH PROPOFOL;  Surgeon: Shellia Cleverly, DO;  Location: [facility];  Service: Gastroenterology;  Laterality: N/A;   ESOPHAGEAL MANOMETRY N/A 06/21/2018   Procedure: ESOPHAGEAL MANOMETRY (EM);  Surgeon: Shellia Cleverly, DO;  Location: [facility];  Service: Gastroenterology;  Laterality: N/A;   ESOPHAGOGASTRODUODENOSCOPY (EGD) WITH PROPOFOL N/A 06/10/2020   Procedure: ESOPHAGOGASTRODUODENOSCOPY (EGD) WITH PROPOFOL;  Surgeon: Shellia Cleverly, DO;  Location: [facility];  Service: Gastroenterology;  Laterality: N/A;   GALLBLADDER SURGERY  2015   INCISIONAL HERNIA REPAIR N/A 09/16/2021   Procedure: OPEN INCISIONAL HERNIA REPAIR WITH MESH;  Surgeon: Abigail Miyamoto, MD;  Location: [facility];  Service: General;  Laterality: N/A;   JOINT REPLACEMENT     RT TOTAL HIP / RT TOTAL KNEE   MASTECTOMY  1998   BILATERAL     PH IMPEDANCE STUDY  06/21/2018   Procedure: PH IMPEDANCE STUDY;  Surgeon: Shellia Cleverly, DO;  Location: [facility];  Service: Gastroenterology;;   POLYPECTOMY  06/10/2020   Procedure: POLYPECTOMY;  Surgeon: Shellia Cleverly, DO;  Location: [facility];  Service: Gastroenterology;;   SKIN CANCER EXCISION  2016   RT SIDE OF NOSE   SUBMUCOSAL TATTOO INJECTION  06/10/2020   Procedure: SUBMUCOSAL TATTOO INJECTION;  Surgeon: Shellia Cleverly, DO;  Location: [facility];  Service: Gastroenterology;;   TONSILLECTOMY  1953   TOTAL HIP ARTHROPLASTY  2010   RIGHT   TOTAL HIP ARTHROPLASTY Left 05/09/2015   Procedure: LEFT TOTAL HIP ARTHROPLASTY ANTERIOR APPROACH;  Surgeon: Ollen Gross, MD;  Location: [facility];  Service: Orthopedics;  Laterality: Left;   TOTAL KNEE ARTHROPLASTY  2001   TUMOR REMOVAL  2012   ABDOMINAL - NON CANCEROUS   Past Medical History:  Diagnosis Date   Acquired trigger finger of right little finger 01/15/2019   Acute blood loss anemia 06/08/2020   Acute lower GI bleeding 06/09/2020   Acute pain of right wrist 03/28/2019   Adenomatous polyp of sigmoid colon    Aftercare 08/17/2018   Arthritis    Cancer (HCC)    HX BREAST CANCER/ SKIN CANCER   Carpal tunnel syndrome of right wrist 01/15/2019   Colon cancer (HCC)    Colonic mass    Complication of anesthesia    N/V WITH MORPHINE   Constipation    Difficulty sleeping    Diverticulosis    Diverticulosis of colon without hemorrhage    Dyslipidemia 02/02/2018   Encounter for orthopedic follow-up care 06/18/2019   Fracture of superior pubic ramus (HCC) 06/16/2018   Fractured hip (HCC)    LEFT - AUG 2016   Gait abnormality 02/09/2017   Gastric polyps    GERD (gastroesophageal reflux disease)    Heartburn    History of right knee joint replacement 10/17/2017   HTN (hypertension) 06/08/2020   Hyperlipidemia    Hypertension    Incisional hernia 09/16/2021   Low back pain 02/09/2017   Lower GI bleed  06/08/2020   Lumbar post-laminectomy syndrome 07/31/2018   Malignant neoplasm of descending colon (HCC)    Melanoma (HCC)  Memory impairment 07/12/2022   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 behavioral disturbance, psychotic disturbance, mood disturbance, or anxiety (HCC) 05/05/2021   Neuropathy    Nocturia    OA (osteoarthritis) of hip 05/09/2015   Osteopenia    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   Pain 05/05/2021   Pain in both lower extremities 08/02/2017   Pain in right hand 06/10/2017   Pain of left hand 04/20/2018   Paresthesia 02/09/2017   PONV (postoperative nausea and vomiting)    PT STATES MORPHINE CAUSED N/V   Primary osteoarthritis of left knee 10/17/2017   Radial styloid tenosynovitis of left hand 05/16/2018   Rosacea    Sleep apnea    Stage 1 breast cancer, ER+, right (HCC) 07/04/2017   Stroke-like symptoms 03/25/2021   Tenosynovitis, wrist 03/28/2019   Trigger finger of right hand 06/10/2017   Trochanteric bursitis of left hip 12/23/2017   Urinary tract infection 04/20/2019   BP 130/74   Pulse 76   Ht 5\' 6"  (1.676 m)   SpO2 99%   BMI 33.02 kg/m   Opioid Risk Score:   Fall Risk Score:  `1  Depression screen [facility] 2/9     09/01/2023   11:55 AM 08/02/2023   11:33 AM 05/30/2023    1:26 PM 04/11/2023   11:04 AM 02/14/2023   11:05 AM 01/14/2023   11:07 AM 12/20/2022   11:43 AM  Depression screen PH[facility] 2/9  Decreased Interest 0 0 1 0 0 0 0  Down, Depressed, Hopeless 0 0 0 0 0 0 0  PHQ - 2 Score 0 0 1 0 0 0 0     Review of Systems  Musculoskeletal:  Positive for back pain.  All other systems reviewed and are negative.      Objective:   Physical Exam Vitals and nursing note reviewed.  Constitutional:      Appearance: Normal appearance.  Cardiovascular:     Rate and Rhythm: Normal rate and regular rhythm.     Pulses: Normal pulses.     Heart sounds: Normal heart sounds.  Pulmonary:     Effort: Pulmonary effort is normal.     Breath sounds: Normal  breath sounds.  Musculoskeletal:     Comments: Normal Muscle Bulk and Muscle Testing Reveals:  Upper Extremities:Full  ROM and Muscle Strength 5/5  Lumbar Paraspinal Tenderness: L-4-L-5 Lower Extremities: Full ROM and Muscle Strength 5/5 Arises from Chair slowly using walker for support Narrow Based  Gait     Skin:    General: Skin is warm and dry.  Neurological:     Mental Status: She is alert and oriented to person, place, and time.  Psychiatric:        Mood and Affect: Mood normal.        Behavior: Behavior normal.         Assessment & Plan:  1.Chronic Bilateral :Low Back Pain  Leg pain: Paresthesia: Continue HEP as Tolerated. Continue to Monitor. 09/01/2023 2. Paresthesia Salli Real Radiculitis: Ms. Decelle has weaned herself off the Lyrica due to daytime drowsiness. We will continue to monitor. 09/01/2023 3. Pain of Left Wrist/ : No complaints today. S/P Carpal Tunnel Release on 06/03/2017 by Dr. Danielle Dess. Dr. Danielle Dess Following. 09/01/2023. 4. Fracture of superior pubic ramus.  Dr. Lequita Halt Following.  Continue to monitor. 09/01/2023. 5. Bilateral Knee OA: Continue Voltaren Gel.  Continue to monitor. Orthopedist following. 09/01/2023 6. Polyarthralgia: Continue to alternate with heat and ice therapy. Continue current medication regime. Continue to monitor. 09/01/2023. 7.  Chronic Pain Syndrome: Refilled::Hydrocodone  10/325 one tablet 4 times a day as needed for pain #120. We will continue the opioid monitoring program, this consists of regular clinic visits, examinations, urine drug screen, pill counts as well as use of [place] Controlled Substance Reporting system. A 12 month History has been reviewed on the [place] Controlled Substance Reporting System on 09/01/2023. 8. Lumbar Compression Fracture L2 and L3:  Ms. Ausburn refused physical therapy. Continue with rest/ heat therapy. Continue to Monitor 09/01/2023. 9. Muscle Spasm: Continue  Flexeril 5 mg at HS. Continue to  monitor. 09/01/2023 10. Right Shoulder Pain: No complaints today. Continue HEP as Tolerated. Alternate Ice and Heat Therapy. 09/01/2023  11. Bilateral  Greater Trochanter Bursitis:  Continue to Monitor. Continue HEP as Tolerated. 09/01/2023     F/U in 1 Month

## 2023-09-01 ENCOUNTER — Encounter: Payer: Self-pay | Admitting: Registered Nurse

## 2023-09-01 ENCOUNTER — Encounter: Attending: Physical Medicine & Rehabilitation | Admitting: Registered Nurse

## 2023-09-01 VITALS — BP 130/74 | HR 76 | Ht 66.0 in

## 2023-09-01 DIAGNOSIS — M7061 Trochanteric bursitis, right hip: Secondary | ICD-10-CM | POA: Diagnosis not present

## 2023-09-01 DIAGNOSIS — Z79891 Long term (current) use of opiate analgesic: Secondary | ICD-10-CM | POA: Diagnosis not present

## 2023-09-01 DIAGNOSIS — M170 Bilateral primary osteoarthritis of knee: Secondary | ICD-10-CM | POA: Insufficient documentation

## 2023-09-01 DIAGNOSIS — G894 Chronic pain syndrome: Secondary | ICD-10-CM | POA: Diagnosis not present

## 2023-09-01 DIAGNOSIS — G8929 Other chronic pain: Secondary | ICD-10-CM | POA: Insufficient documentation

## 2023-09-01 DIAGNOSIS — M545 Low back pain, unspecified: Secondary | ICD-10-CM | POA: Insufficient documentation

## 2023-09-01 DIAGNOSIS — M2550 Pain in unspecified joint: Secondary | ICD-10-CM | POA: Insufficient documentation

## 2023-09-01 DIAGNOSIS — Z5181 Encounter for therapeutic drug level monitoring: Secondary | ICD-10-CM | POA: Insufficient documentation

## 2023-09-01 DIAGNOSIS — M7062 Trochanteric bursitis, left hip: Secondary | ICD-10-CM | POA: Diagnosis not present

## 2023-09-01 DIAGNOSIS — R202 Paresthesia of skin: Secondary | ICD-10-CM | POA: Insufficient documentation

## 2023-09-01 MED ORDER — HYDROCODONE-ACETAMINOPHEN 10-325 MG PO TABS: 1.0000 | ORAL_TABLET | Freq: Four times a day (QID) | ORAL | 0 refills | Status: DC | PRN

## 2023-09-01 NOTE — Patient Instructions (Signed)
 Call Pharmacy for Medication on 09/29/2023 or 09/30/2023.   Call [facility] with any questions or concerns

## 2023-09-02 ENCOUNTER — Ambulatory Visit: Payer: Medicare Other | Admitting: *Deleted

## 2023-09-02 VITALS — BP 137/74 | HR 71 | Ht 66.0 in | Wt 208.6 lb

## 2023-09-02 DIAGNOSIS — Z0000 Encounter for general adult medical examination without abnormal findings: Secondary | ICD-10-CM

## 2023-09-02 NOTE — Progress Notes (Signed)
 Subjective:   Crystal Lee is a 89 y.o. female who presents for Medicare Annual (Subsequent) preventive examination.  Visit Complete: In person   Cardiac Risk Factors include: advanced age (>49men, >48 women);dyslipidemia;hypertension;obesity (BMI >30kg/m2)     Objective:    Today's Vitals   09/02/23 1302 09/02/23 1306 09/02/23 1324  BP: (!) 151/70  137/74  Pulse: 80  71  Weight: 208 lb 9.6 oz (94.6 kg)    Height: 5\' 6"  (1.676 m)    PainSc:  5     Body mass index is 33.67 kg/m.     09/02/2023    1:16 PM 07/14/2023   10:49 AM 06/23/2023   12:37 PM 01/24/2023   11:29 AM 01/17/2023   11:33 AM 01/14/2023   12:52 PM 12/21/2022   11:07 AM  Advanced Directives  Does Patient Have a Medical Advance Directive? Yes Yes Yes Yes Yes No Yes  Type of Estate agent of [place];Living will Healthcare Power of [employer] Power of [employer] Power of [place];Living will  Living will;Healthcare Power of Attorney  Does patient want to make changes to medical advance directive? No - Patient declined No - Patient declined No - Patient declined No - Patient declined No - Patient declined  No - Patient declined  Copy of Healthcare Power of Attorney in Chart? No - copy requested No - copy requested  No - copy requested No - copy requested  No - copy requested  Would patient like information on creating a medical advance directive?    No - Patient declined No - Patient declined No - Patient declined     Current Medications (verified) Outpatient Encounter Medications as of 09/02/2023  Medication Sig   aspirin EC 81 MG tablet Take 81 mg by mouth daily. Swallow whole.   atorvastatin (LIPITOR) 20 MG tablet Take 1 tablet (20 mg total) by mouth daily.   bacitracin ophthalmic ointment Apply to eyelids at bedtime as needed.   estradiol (ESTRACE) 0.1 MG/GM vaginal cream Place 0.5 g vaginally 2 (two) times a week. Place 0.5g nightly for two weeks then twice a week after    fosfomycin (MONUROL) 3 g PACK Take 1 packet every 10 days. Dissolve packet in 3-4 oz of water and drink immediately.   HYDROcodone-acetaminophen (NORCO) 10-325 MG tablet Take 1 tablet by mouth every 6 (six) hours as needed.   ipratropium (ATROVENT) 0.06 % nasal spray Place 2 sprays into both nostrils 3 (three) times daily.   losartan (COZAAR) 25 MG tablet Take 1 tablet (25 mg total) by mouth daily. (Patient taking differently: Take 37.5 mg by mouth daily.)   memantine (NAMENDA) 5 MG tablet Take 1 tablet (5 mg total) by mouth 2 (two) times daily.   mirabegron ER (MYRBETRIQ) 50 MG TB24 tablet Take 1 tablet (50 mg total) by mouth daily.   Multiple Vitamins-Minerals (MULTIVITAMIN ADULTS) TABS Take 1 tablet by mouth daily.   ondansetron (ZOFRAN-ODT) 4 MG disintegrating tablet Take 1 tablet (4 mg total) by mouth every 8 (eight) hours as needed for nausea or vomiting.   vitamin B-12 (CYANOCOBALAMIN) 1000 MCG tablet Take 1,000 mcg by mouth daily.   [DISCONTINUED] cyclobenzaprine (FLEXERIL) 5 MG tablet Take 1 tablet (5 mg total) by mouth at bedtime as needed for muscle spasms.   [DISCONTINUED] naloxegol oxalate (MOVANTIK) 25 MG TABS tablet Take 1 tablet (25 mg total) by mouth daily.   [DISCONTINUED] sulfamethoxazole-trimethoprim (BACTRIM) 400-80 MG tablet Take 0.5 tablets by mouth daily.   [DISCONTINUED]  valACYclovir (VALTREX) 1000 MG tablet Take 1 tablet (1,000 mg total) by mouth 2 (two) times daily.   Facility-Administered Encounter Medications as of 09/02/2023  Medication   lidocaine (XYLOCAINE) 2 % jelly 1 Application    Allergies (verified) Morphine, Propoxyphene, Donepezil, and Tape   History: Past Medical History:  Diagnosis Date   Acquired trigger finger of right little finger 01/15/2019   Acute blood loss anemia 06/08/2020   Acute lower GI bleeding 06/09/2020   Acute pain of right wrist 03/28/2019   Adenomatous polyp of sigmoid colon    Aftercare 08/17/2018   Arthritis    Cancer (HCC)     HX BREAST CANCER/ SKIN CANCER   Carpal tunnel syndrome of right wrist 01/15/2019   Colon cancer (HCC)    Colonic mass    Complication of anesthesia    N/V WITH MORPHINE   Constipation    Difficulty sleeping    Diverticulosis    Diverticulosis of colon without hemorrhage    Dyslipidemia 02/02/2018   Encounter for orthopedic follow-up care 06/18/2019   Fracture of superior pubic ramus (HCC) 06/16/2018   Fractured hip (HCC)    LEFT - AUG 2016   Gait abnormality 02/09/2017   Gastric polyps    GERD (gastroesophageal reflux disease)    Heartburn    History of right knee joint replacement 10/17/2017   HTN (hypertension) 06/08/2020   Hyperlipidemia    Hypertension    Incisional hernia 09/16/2021   Low back pain 02/09/2017   Lower GI bleed 06/08/2020   Lumbar post-laminectomy syndrome 07/31/2018   Malignant neoplasm of descending colon (HCC)    Melanoma (HCC)    Memory impairment 07/12/2022   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 behavioral disturbance, psychotic disturbance, mood disturbance, or anxiety (HCC) 05/05/2021   Neuropathy    Nocturia    OA (osteoarthritis) of hip 05/09/2015   Osteopenia    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   Pain 05/05/2021   Pain in both lower extremities 08/02/2017   Pain in right hand 06/10/2017   Pain of left hand 04/20/2018   Paresthesia 02/09/2017   PONV (postoperative nausea and vomiting)    PT STATES MORPHINE CAUSED N/V   Primary osteoarthritis of left knee 10/17/2017   Radial styloid tenosynovitis of left hand 05/16/2018   Rosacea    Sleep apnea    Stage 1 breast cancer, ER+, right (HCC) 07/04/2017   Stroke-like symptoms 03/25/2021   Tenosynovitis, wrist 03/28/2019   Trigger finger of right hand 06/10/2017   Trochanteric bursitis of left hip 12/23/2017   Urinary tract infection 04/20/2019   Past Surgical History:  Procedure Laterality Date   24 HOUR PH STUDY N/A 06/21/2018   Procedure: 24 HOUR PH STUDY;  Surgeon: Shellia Cleverly, DO;  Location: [facility];  Service: Gastroenterology;  Laterality: N/A;  with impedance   ABDOMINAL HYSTERECTOMY  2010   ANAL RECTAL MANOMETRY N/A 09/19/2019   Procedure: ANO RECTAL MANOMETRY;  Surgeon: Napoleon Form, MD;  Location: [facility];  Service: Endoscopy;  Laterality: N/A;   APPENDECTOMY  1949   BACK SURGERY  1973   BIOPSY  06/10/2020   Procedure: BIOPSY;  Surgeon: Shellia Cleverly, DO;  Location: [facility];  Service: Gastroenterology;;  EGD and COLON   BREAST SURGERY     CATARACT EXTRACTION Bilateral 2011   CHOLECYSTECTOMY     COLON RESECTION N/A 06/12/2020   Procedure: LAPAROSCOPIC COLON RESECTION;  Surgeon: Abigail Miyamoto, MD;  Location: [facility];  Service: General;  Laterality:  N/A;   COLONOSCOPY WITH PROPOFOL N/A 06/10/2020   Procedure: COLONOSCOPY WITH PROPOFOL;  Surgeon: Shellia Cleverly, DO;  [facility]: [facility];  Service: Gastroenterology;  Laterality: N/A;   ESOPHAGEAL MANOMETRY N/A 06/21/2018   Procedure: ESOPHAGEAL MANOMETRY (EM);  Surgeon: Shellia Cleverly, DO;  [facility]: [facility];  Service: Gastroenterology;  Laterality: N/A;   ESOPHAGOGASTRODUODENOSCOPY (EGD) WITH PROPOFOL N/A 06/10/2020   Procedure: ESOPHAGOGASTRODUODENOSCOPY (EGD) WITH PROPOFOL;  Surgeon: Shellia Cleverly, DO;  [facility];  Service: Gastroenterology;  Laterality: N/A;   GALLBLADDER SURGERY  2015   INCISIONAL HERNIA REPAIR N/A 09/16/2021   Procedure: OPEN INCISIONAL HERNIA REPAIR WITH MESH;  Surgeon: Abigail Miyamoto, MD;  Location: [facility];  Service: General;  Laterality: N/A;   JOINT REPLACEMENT     RT TOTAL HIP / RT TOTAL KNEE   MASTECTOMY  1998   BILATERAL    PH IMPEDANCE STUDY  06/21/2018   Procedure: PH IMPEDANCE STUDY;  Surgeon: Shellia Cleverly, DO;  [facility]: [facility];  Service: Gastroenterology;;   POLYPECTOMY  06/10/2020   Procedure: POLYPECTOMY;  Surgeon: Shellia Cleverly, DO;  [facility];  Service: Gastroenterology;;    SKIN CANCER EXCISION  2016   RT SIDE OF NOSE   SUBMUCOSAL TATTOO INJECTION  06/10/2020   Procedure: SUBMUCOSAL TATTOO INJECTION;  Surgeon: Shellia Cleverly, DO;  [facility];  Service: Gastroenterology;;   TONSILLECTOMY  1953   TOTAL HIP ARTHROPLASTY  2010   RIGHT   TOTAL HIP ARTHROPLASTY Left 05/09/2015   Procedure: LEFT TOTAL HIP ARTHROPLASTY ANTERIOR APPROACH;  Surgeon: Ollen Gross, MD;  Location: [facility];  Service: Orthopedics;  Laterality: Left;   TOTAL KNEE ARTHROPLASTY  2001   TUMOR REMOVAL  2012   ABDOMINAL - NON CANCEROUS   Family History  Problem Relation Age of Onset   Other Mother        complications from flu   Other Father        unsure of cause   Colon cancer Neg Hx    Social History   Socioeconomic History   Marital status: Widowed    Spouse name: Not on file   Number of children: 3   Years of education: 16 years   Highest education level: Not on file  Occupational History   Occupation: Retired  Tobacco Use   Smoking status: Former    Current packs/day: 0.00    Average packs/day: 0.5 packs/day for 20.0 years (10.0 ttl pk-yrs)    Types: Cigarettes    Start date: 02/03/1956    Quit date: 02/03/1976    Years since quitting: 47.6   Smokeless tobacco: Never  Vaping Use   Vaping status: Never Used  Substance and Sexual Activity   Alcohol use: No   Drug use: No   Sexual activity: Not Currently    Birth control/protection: Post-menopausal  Other Topics Concern   Not on file  Social History Narrative   Lives at home with husband.   Right-handed.   No caffeine use.   retired   Teacher, early years/pre Strain: Low Risk  (09/02/2023)   Overall Financial Resource Strain (CARDIA)    Difficulty of Paying Living Expenses: Not hard at all  Food Insecurity: No Food Insecurity (09/02/2023)   Hunger Vital Sign    Worried About Running Out of Food in the Last Year: Never true    Ran Out of Food in the Last Year: Never true   Transportation Needs: No Transportation  Needs (09/02/2023)   PRAPARE - Administrator, Civil Service (Medical): No    Lack of Transportation (Non-Medical): No  Physical Activity: Inactive (09/02/2023)   Exercise Vital Sign    Days of Exercise per Week: 0 days    Minutes of Exercise per Session: 0 min  Stress: No Stress Concern Present (09/02/2023)   [name] of Occupational Health - Occupational Stress Questionnaire    Feeling of Stress : Only a little  Social Connections: Moderately Isolated (09/02/2023)   Social Connection and Isolation Panel [NHANES]    Frequency of Communication with Friends and Family: Twice a week    Frequency of Social Gatherings with Friends and Family: Three times a week    Attends Religious Services: More than 4 times per year    Active Member of Clubs or Organizations: No    Attends Banker Meetings: Never    Marital Status: Widowed    Tobacco Counseling Counseling given: Not Answered   Clinical Intake:  Pre-visit preparation completed: Yes  Pain : 0-10 Pain Score: 5  Pain Type: Chronic pain Pain Descriptors / Indicators: Aching Pain Onset: More than a month ago Pain Frequency: Constant  BMI - recorded: 33.67 Nutritional Status: BMI > 30  Obese Nutritional Risks: None Diabetes: No  How often do you need to have someone help you when you read instructions, pamphlets, or other written materials from your doctor or pharmacy?: 1 - Never  Interpreter Needed?: No  Information entered by :: Donne Anon, CMA   Activities of Daily Living    09/02/2023    1:11 PM  In your present state of health, do you have any difficulty performing the following activities:  Hearing? 1  Comment wears hearing aids  Vision? 1  Difficulty concentrating or making decisions? 0  Walking or climbing stairs? 1  Dressing or bathing? 0  Doing errands, shopping? 0  Preparing Food and eating ? N  Using the Toilet? N  In the past six  months, have you accidently leaked urine? Y  Do you have problems with loss of bowel control? N  Managing your Medications? N  Managing your Finances? N  Housekeeping or managing your Housekeeping? N    Patient Care Team: Copland, Gwenlyn Found, MD as PCP - General (Family Medicine) Josph Macho, MD as Medical Oncologist (Oncology) Marguerita Beards, MD as Consulting Physician (Obstetrics and Gynecology)  Indicate any recent Medical Services you may have received from other than [facility] providers in the past year (date may be approximate).     Assessment:   This is a routine wellness examination for [place].  Hearing/Vision screen No results found.   Goals Addressed   None    Depression Screen    09/02/2023    1:16 PM 09/01/2023   11:55 AM 08/02/2023   11:33 AM 05/30/2023    1:26 PM 04/11/2023   11:04 AM 02/14/2023   11:05 AM 01/14/2023   11:07 AM  PHQ 2/9 Scores  PHQ - 2 Score 0 0 0 1 0 0 0    Fall Risk    09/02/2023    1:15 PM 09/01/2023   11:54 AM 08/02/2023   11:33 AM 07/14/2023   10:49 AM 05/30/2023    1:26 PM  Fall Risk   Falls in the past year? 1 1 1 1  0  Number falls in past yr: 0 0 0 0 0  Injury with Fall? 0  1 1 0  Risk for fall  due to : History of fall(s);Impaired balance/gait History of fall(s);Impaired balance/gait   No Fall Risks  Follow up Falls evaluation completed   Falls evaluation completed Falls evaluation completed    MEDICARE RISK AT HOME: Medicare Risk at Home Any stairs in or around the home?: No If so, are there any without handrails?: No Home free of loose throw rugs in walkways, pet beds, electrical cords, etc?: Yes Adequate lighting in your home to reduce risk of falls?: Yes Life alert?: No Use of a cane, walker or w/c?: Yes Grab bars in the bathroom?: Yes Shower chair or bench in shower?: Yes Elevated toilet seat or a handicapped toilet?: Yes  TIMED UP AND GO:  Was the test performed?  Yes  Length of time to ambulate 10 feet: 7  sec Gait steady and fast with assistive device    Cognitive Function:    08/31/2022    2:13 PM  MMSE - Mini Mental State Exam  Not completed: Unable to complete      05/05/2021   12:00 PM  Montreal Cognitive Assessment   Visuospatial/ Executive (0/5) 2  Naming (0/3) 3  Attention: Read list of digits (0/2) 2  Attention: Read list of letters (0/1) 1  Attention: Serial 7 subtraction starting at 100 (0/3) 1  Language: Repeat phrase (0/2) 1  Language : Fluency (0/1) 0  Abstraction (0/2) 2  Delayed Recall (0/5) 0  Orientation (0/6) 6  Total 18      09/02/2023    1:20 PM 08/07/2021    3:35 PM  6CIT Screen  What Year? 0 points 0 points  What month? 0 points 0 points  What time? 0 points 0 points  Count back from 20 0 points 0 points  Months in reverse 0 points 0 points  Repeat phrase 0 points 0 points  Total Score 0 points 0 points    Immunizations Immunization History  Administered Date(s) Administered   Fluad Quad(high Dose 65+) 06/29/2019, 04/01/2021, 02/22/2022   Influenza, High Dose Seasonal PF 02/02/2018   Influenza,inj,Quad PF,6+ Mos 07/17/2020   Influenza,inj,quad, With Preservative 05/07/2017   Moderna SARS-COV2 Booster Vaccination 09/18/2020   Moderna Sars-Covid-2 Vaccination 07/27/2019, 08/24/2019   Pneumococcal Conjugate-13 02/07/2014   Pneumococcal Polysaccharide-23 03/21/2002   Tdap 02/09/2016   Zoster, Live 06/08/2011    TDAP status: Up to date  Flu Vaccine status: Declined, Education has been provided regarding the importance of this vaccine but patient still declined. Advised may receive this vaccine at local pharmacy or Health Dept. Aware to provide a copy of the vaccination record if obtained from local pharmacy or Health Dept. Verbalized acceptance and understanding.  Pneumococcal vaccine status: Up to date  Covid-19 vaccine status: Information provided on how to obtain vaccines.   Qualifies for Shingles Vaccine? Yes   Zostavax completed Yes    Shingrix Completed?: No.    Education has been provided regarding the importance of this vaccine. Patient has been advised to call insurance company to determine out of pocket expense if they have not yet received this vaccine. Advised may also receive vaccine at local pharmacy or Health Dept. Verbalized acceptance and understanding.  Screening Tests Health Maintenance  Topic Date Due   Zoster Vaccines- Shingrix (1 of 2) 10/12/1952   COVID-19 Vaccine (3 - Moderna risk series) 10/16/2020   Medicare Annual Wellness (AWV)  08/31/2023   INFLUENZA VACCINE  12/30/2023   DTaP/Tdap/Td (2 - Td or Tdap) 02/08/2026   Colonoscopy  07/03/2026   Pneumonia Vaccine 65+ Years  old  Completed   DEXA SCAN  Completed   HPV VACCINES  Aged Out    Health Maintenance  Health Maintenance Due  Topic Date Due   Zoster Vaccines- Shingrix (1 of 2) 10/12/1952   COVID-19 Vaccine (3 - Moderna risk series) 10/16/2020   Medicare Annual Wellness (AWV)  08/31/2023    Colorectal cancer screening: No longer required.   Mammogram status: No longer required due to bilateral mastectomy.  Bone Density status: Completed 09/13/22. Results reflect: Bone density results: OSTEOPOROSIS. Repeat every 2 years.  Lung Cancer Screening: (Low Dose CT Chest recommended if Age 8-80 years, 20 pack-year currently smoking OR have quit w/in 15years.) does not qualify.   Additional Screening:  Hepatitis C Screening: does not qualify  Vision Screening: Recommended annual ophthalmology exams for early detection of glaucoma and other disorders of the eye. Is the patient up to date with their annual eye exam?  Yes  Who is the provider or what is the name of the office in which the patient attends annual eye exams? Dr. Charlotte Sanes If pt is not established with a provider, would they like to be referred to a provider to establish care? No .   Dental Screening: Recommended annual dental exams for proper oral hygiene  Diabetic Foot Exam:  N/a  Community Resource Referral / Chronic Care Management: CRR required this visit?  No   CCM required this visit?  No     Plan:     I have personally reviewed and noted the following in the patient's chart:   Medical and social history Use of alcohol, tobacco or illicit drugs  Current medications and supplements including opioid prescriptions. Patient is currently taking opioid prescriptions. Information provided to patient regarding non-opioid alternatives. Patient advised to discuss non-opioid treatment plan with their provider. Functional ability and status Nutritional status Physical activity Advanced directives List of other physicians Hospitalizations, surgeries, and ER visits in previous 12 months Vitals Screenings to include cognitive, depression, and falls Referrals and appointments  In addition, I have reviewed and discussed with patient certain preventive protocols, quality metrics, and best practice recommendations. A written personalized care plan for preventive services as well as general preventive health recommendations were provided to patient.     Donne Anon, CMA   09/02/2023   After Visit Summary: (In Person-Declined) Patient declined AVS at this time.  Nurse Notes: None

## 2023-09-02 NOTE — Patient Instructions (Signed)
 Crystal Lee , Thank you for taking time to come for your Medicare Wellness Visit. I appreciate your ongoing commitment to your health goals. Please review the following plan we discussed and let me know if I can assist you in the future.     This is a list of the screening recommended for you and due dates:  Health Maintenance  Topic Date Due   Zoster (Shingles) Vaccine (1 of 2) 10/12/1952   COVID-19 Vaccine (3 - Moderna risk series) 10/16/2020   Flu Shot  12/30/2023   Medicare Annual Wellness Visit  09/01/2024   DTaP/Tdap/Td vaccine (2 - Td or Tdap) 02/08/2026   Colon Cancer Screening  07/03/2026   Pneumonia Vaccine  Completed   DEXA scan (bone density measurement)  Completed   HPV Vaccine  Aged Out     Next appointment: Follow up in one year for your annual wellness visit    Preventive Care 65 Years and Older, Female Preventive care refers to lifestyle choices and visits with your health care provider that can promote health and wellness. What does preventive care include? A yearly physical exam. This is also called an annual well check. Dental exams once or twice a year. Routine eye exams. Ask your health care provider how often you should have your eyes checked. Personal lifestyle choices, including: Daily care of your teeth and gums. Regular physical activity. Eating a healthy diet. Avoiding tobacco and drug use. Limiting alcohol use. Practicing safe sex. Taking low-dose aspirin every day. Taking vitamin and mineral supplements as recommended by your health care provider. What happens during an annual well check? The services and screenings done by your health care provider during your annual well check will depend on your age, overall health, lifestyle risk factors, and family history of disease. Counseling  Your health care provider may ask you questions about your: Alcohol use. Tobacco use. Drug use. Emotional well-being. Home and relationship well-being. Sexual  activity. Eating habits. History of falls. Memory and ability to understand (cognition). Work and work Astronomer. Reproductive health. Screening  You may have the following tests or measurements: Height, weight, and BMI. Blood pressure. Lipid and cholesterol levels. These may be checked every 5 years, or more frequently if you are over 37 years old. Skin check. Lung cancer screening. You may have this screening every year starting at age 79 if you have a 30-pack-year history of smoking and currently smoke or have quit within the past 15 years. Fecal occult blood test (FOBT) of the stool. You may have this test every year starting at age 36. Flexible sigmoidoscopy or colonoscopy. You may have a sigmoidoscopy every 5 years or a colonoscopy every 10 years starting at age 37. Hepatitis C blood test. Hepatitis B blood test. Sexually transmitted disease (STD) testing. Diabetes screening. This is done by checking your blood sugar (glucose) after you have not eaten for a while (fasting). You may have this done every 1-3 years. Bone density scan. This is done to screen for osteoporosis. You may have this done starting at age 5. Mammogram. This may be done every 1-2 years. Talk to your health care provider about how often you should have regular mammograms. Talk with your health care provider about your test results, treatment options, and if necessary, the need for more tests. Vaccines  Your health care provider may recommend certain vaccines, such as: Influenza vaccine. This is recommended every year. Tetanus, diphtheria, and acellular pertussis (Tdap, Td) vaccine. You may need a Td booster every  10 years. Zoster vaccine. You may need this after age 66. Pneumococcal 13-valent conjugate (PCV13) vaccine. One dose is recommended after age 65. Pneumococcal polysaccharide (PPSV23) vaccine. One dose is recommended after age 26. Talk to your health care provider about which screenings and vaccines  you need and how often you need them. This information is not intended to replace advice given to you by your health care provider. Make sure you discuss any questions you have with your health care provider. Document Released: 06/13/2015 Document Revised: 02/04/2016 Document Reviewed: 03/18/2015 Elsevier Interactive Patient Education  2017 [employer].  Fall Prevention in the Home Falls can cause injuries. They can happen to people of all ages. There are many things you can do to make your home safe and to help prevent falls. What can I do on the outside of my home? Regularly fix the edges of walkways and driveways and fix any cracks. Remove anything that might make you trip as you walk through a door, such as a raised step or threshold. Trim any bushes or trees on the path to your home. Use bright outdoor lighting. Clear any walking paths of anything that might make someone trip, such as rocks or tools. Regularly check to see if handrails are loose or broken. Make sure that both sides of any steps have handrails. Any raised decks and porches should have guardrails on the edges. Have any leaves, snow, or ice cleared regularly. Use sand or salt on walking paths during winter. Clean up any spills in your garage right away. This includes oil or grease spills. What can I do in the bathroom? Use night lights. Install grab bars by the toilet and in the tub and shower. Do not use towel bars as grab bars. Use non-skid mats or decals in the tub or shower. If you need to sit down in the shower, use a plastic, non-slip stool. Keep the floor dry. Clean up any water that spills on the floor as soon as it happens. Remove soap buildup in the tub or shower regularly. Attach bath mats securely with double-sided non-slip rug tape. Do not have throw rugs and other things on the floor that can make you trip. What can I do in the bedroom? Use night lights. Make sure that you have a light by your bed that  is easy to reach. Do not use any sheets or blankets that are too big for your bed. They should not hang down onto the floor. Have a firm chair that has side arms. You can use this for support while you get dressed. Do not have throw rugs and other things on the floor that can make you trip. What can I do in the kitchen? Clean up any spills right away. Avoid walking on wet floors. Keep items that you use a lot in easy-to-reach places. If you need to reach something above you, use a strong step stool that has a grab bar. Keep electrical cords out of the way. Do not use floor polish or wax that makes floors slippery. If you must use wax, use non-skid floor wax. Do not have throw rugs and other things on the floor that can make you trip. What can I do with my stairs? Do not leave any items on the stairs. Make sure that there are handrails on both sides of the stairs and use them. Fix handrails that are broken or loose. Make sure that handrails are as long as the stairways. Check any carpeting to make  sure that it is firmly attached to the stairs. Fix any carpet that is loose or worn. Avoid having throw rugs at the top or bottom of the stairs. If you do have throw rugs, attach them to the floor with carpet tape. Make sure that you have a light switch at the top of the stairs and the bottom of the stairs. If you do not have them, ask someone to add them for you. What else can I do to help prevent falls? Wear shoes that: Do not have high heels. Have rubber bottoms. Are comfortable and fit you well. Are closed at the toe. Do not wear sandals. If you use a stepladder: Make sure that it is fully opened. Do not climb a closed stepladder. Make sure that both sides of the stepladder are locked into place. Ask someone to hold it for you, if possible. Clearly mark and make sure that you can see: Any grab bars or handrails. First and last steps. Where the edge of each step is. Use tools that help you  move around (mobility aids) if they are needed. These include: Canes. Walkers. Scooters. Crutches. Turn on the lights when you go into a dark area. Replace any light bulbs as soon as they burn out. Set up your furniture so you have a clear path. Avoid moving your furniture around. If any of your floors are uneven, fix them. If there are any pets around you, be aware of where they are. Review your medicines with your doctor. Some medicines can make you feel dizzy. This can increase your chance of falling. Ask your doctor what other things that you can do to help prevent falls. This information is not intended to replace advice given to you by your health care provider. Make sure you discuss any questions you have with your health care provider. Document Released: 03/13/2009 Document Revised: 10/23/2015 Document Reviewed: 06/21/2014 Elsevier Interactive Patient Education  2017 [employer].

## 2023-09-06 ENCOUNTER — Telehealth: Payer: Self-pay | Admitting: Family Medicine

## 2023-09-06 MED ORDER — METOPROLOL TARTRATE 25 MG PO TABS: ORAL_TABLET | ORAL | 2 refills | Status: DC

## 2023-09-06 NOTE — Telephone Encounter (Signed)
 Called her to go over cardiology recs regarding her zio patch   Revankar, Aundra Dubin, MD  Diani Jillson, Gwenlyn Found, MD Let it be symptom guided. Metoprolol tartrate 12.5mg   in AM if needed only. Then make sure vitals are fine. Thanks!       Previous Messages    ----- Message ----- From: Pearline Cables, MD Sent: 08/26/2023   5:05 PM EDT To: Garwin Brothers, MD  Hi Glean Hess- thanks for seeing [facility] recently!  The zio patch I ordered for her came back:  1. NSR with sinus brady (51/min) and sinus tachy (151/min), ave 75/min. 2. 9 episodes of NS SVT, longest 19 beats at 94/min and the fastest 6 beats at 162/min 3. No VT, afib or sustained SVT 4. No prolonged pauses 5. Rare PVC's and PAC's (<1%) 6. One symptoms recorded associated with NSR Patch Wear Time:  7 days and 7 hours (2025-03-05T13:50:16-0500 to 2025-03-12T22:42:19-0400)   Anything you would like me to do as far as managing these results? Thanks again for seeing her!  -sent rx to her [facility] ordered this encounter  Medications   metoprolol tartrate (LOPRESSOR) 25 MG tablet    Sig: Take 12.5 mg (half tablet) in the morning as needed for palpitations    Dispense:  45 tablet    Refill:  2   She will try this as needed and we will get her in for a nurse visit for BP and pulse check

## 2023-09-06 NOTE — Telephone Encounter (Signed)
 BP visit scheduled. Pt aware and voices understanding.

## 2023-09-20 ENCOUNTER — Encounter: Payer: Self-pay | Admitting: Obstetrics and Gynecology

## 2023-09-20 ENCOUNTER — Ambulatory Visit (INDEPENDENT_AMBULATORY_CARE_PROVIDER_SITE_OTHER): Admitting: Obstetrics and Gynecology

## 2023-09-20 VITALS — BP 118/73 | HR 118

## 2023-09-20 DIAGNOSIS — R350 Frequency of micturition: Secondary | ICD-10-CM

## 2023-09-20 DIAGNOSIS — N390 Urinary tract infection, site not specified: Secondary | ICD-10-CM

## 2023-09-20 DIAGNOSIS — N3281 Overactive bladder: Secondary | ICD-10-CM

## 2023-09-20 DIAGNOSIS — Z87440 Personal history of urinary (tract) infections: Secondary | ICD-10-CM | POA: Diagnosis not present

## 2023-09-20 NOTE — Progress Notes (Signed)
  Urogynecology Return Visit  SUBJECTIVE  History of Present Illness: Crystal Lee is a 89 y.o. female seen in follow-up for OAB and nocturia. Plan at last visit was re-start Myrbetriq .   Patient is taking her prophylaxis of fosfomycin every 10 days.   She reports her OAB is not controlled at all and she continues to have significant nocturia.   Past Medical History: Patient  has a past medical history of Acquired trigger finger of right little finger (01/15/2019), Acute blood loss anemia (06/08/2020), Acute lower GI bleeding (06/09/2020), Acute pain of right wrist (03/28/2019), Adenomatous polyp of sigmoid colon, Aftercare (08/17/2018), Arthritis, Cancer (HCC), Carpal tunnel syndrome of right wrist (01/15/2019), Colon cancer (HCC), Colonic mass, Complication of anesthesia, Constipation, Difficulty sleeping, Diverticulosis, Diverticulosis of colon without hemorrhage, Dyslipidemia (02/02/2018), Encounter for orthopedic follow-up care (06/18/2019), Fracture of superior pubic ramus (HCC) (06/16/2018), Fractured hip (HCC), Gait abnormality (02/09/2017), Gastric polyps, GERD (gastroesophageal reflux disease), Heartburn, History of right knee joint replacement (10/17/2017), HTN (hypertension) (06/08/2020), Hyperlipidemia, Hypertension, Incisional hernia (09/16/2021), Low back pain (02/09/2017), Lower GI bleed (06/08/2020), Lumbar post-laminectomy syndrome (07/31/2018), Malignant neoplasm of descending colon (HCC), Melanoma (HCC), Memory impairment (07/12/2022), 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 behavioral disturbance, psychotic disturbance, mood disturbance, or anxiety (HCC) (05/05/2021), Neuropathy, Nocturia, OA (osteoarthritis) of hip (05/09/2015), Osteopenia, 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   Past Surgical History: She  has a past surgical history that includes Breast surgery; Cholecystectomy; Total hip arthroplasty (2010); Mastectomy (1998); Joint replacement; Total knee arthroplasty (2001); Back surgery (1973); Skin cancer excision (2016); Appendectomy (1949); Tumor removal (2012); Total hip arthroplasty (Left, 05/09/2015); Abdominal hysterectomy (2010); Gallbladder surgery (2015); Tonsillectomy (1953); Cataract extraction (Bilateral, 2011); Esophageal manometry (N/A, 06/21/2018); 24 hour ph study (N/A, 06/21/2018); PH impedance study (06/21/2018); Anal Rectal manometry (N/A, 09/19/2019); Colonoscopy with propofol  (N/A, 06/10/2020); Esophagogastroduodenoscopy (egd) with propofol  (N/A, 06/10/2020); biopsy (06/10/2020); Submucosal tattoo injection (06/10/2020); polypectomy (06/10/2020); Colon resection (N/A, 06/12/2020); and Incisional hernia repair (N/A, 09/16/2021).   Medications: She has a current medication list which includes the following prescription(s): aspirin  ec, atorvastatin , bacitracin , estradiol , fosfomycin, hydrocodone -acetaminophen , ipratropium, losartan , memantine , metoprolol  tartrate, mirabegron  er, multivitamin adults, ondansetron , and cyanocobalamin, and the following Facility-Administered Medications: lidocaine .   Allergies: Patient is allergic to morphine , propoxyphene, donepezil , and tape.   Social History: Patient  reports that she quit smoking about 47 years ago. Her smoking use included cigarettes. She started smoking about 67 years ago. She has a 10 pack-year smoking history. She has never used  smokeless tobacco. She reports that she does not drink alcohol and does not use drugs.     OBJECTIVE     Physical Exam: Vitals:   09/20/23 1057  BP: 118/73  Pulse: (!) 118   Gen: No apparent distress, A&O x 3.  Detailed Urogynecologic Evaluation:  Deferred.    Procedure: The patient was placed in the sitting position and the right lower extremity was prepped in the usual fashion. The PTNS needle was then inserted at a 60 degree angle, 5 cm cephalad and 2 cm posterior to the medial malleolus. The PTNS unit was then programmed  and an optimal response was noted at 10 milliamps. The PTNS stimulation was then performed at this setting for 30 minutes without incident and the patient tolerated the procedure well. The needle was removed and hemostasis was noted.       ASSESSMENT AND PLAN    Crystal Lee is a 89 y.o. with:  1. Overactive bladder   2. Urinary frequency   3. Recurrent UTI    Patient is currently on Myrbetriq  50mg  and this is not controlling her nocturia. We discussed doing some PTNS and seeing if this is helpful. We did our first session today and she did did well, see above for procedure information.   Patient is not asymptomatic at this time and is doing well on Fosfomycin everry 10 days.   The pt will return in 2 weeks for PTNS session # 2. All questions were answered.  Artyom Stencel G Jerilyn Gillaspie, NP

## 2023-09-21 ENCOUNTER — Ambulatory Visit

## 2023-09-21 VITALS — BP 126/64 | HR 67

## 2023-09-21 DIAGNOSIS — I10 Essential (primary) hypertension: Secondary | ICD-10-CM

## 2023-09-21 NOTE — Progress Notes (Signed)
 Pt here for Blood pressure check per Dr.Copland we will get her in for a nurse visit for BP and pulse check .  Pt currently takes: Metoprolol  12.5 mg half tablet daily and  Losartan  25 mg daily   Pt reports compliance with medication. Yes she taking  BP today @ = 126/60 Left side 126/64 Right side   HH:67   Pt advised per Dr.Copland to continue the same medication and no changes.

## 2023-09-22 NOTE — Progress Notes (Addendum)
 [facility] [address], [address] [place], [place] [address] [phone]  Date:  09/26/2023   Name:  Crystal Lee   DOB:  February 25, 1934   MRN:  [id number]  PCP:  Kaylee Partridge, MD    Chief Complaint: BP follow up (Concerns/ questions: pt asks for a referral to another cardiologist. There is a major language barrier. /)   History of Present Illness:  Crystal Lee is a 89 y.o. very pleasant female patient who presents with the following:  Patient seen today for blood pressure follow-up.  Most recent visit with myself was in February-  history of hypertension, hyperlipidemia, sleep apnea, breast cancer and colon cancer presently in remission, mild dementia and likely due to Alzheimer's disease per neurology  She had a lap assisted partial colectomy January 2022 as well as chemotherapy. She had follow-up repair of incisional hernia in April 2023   Her oncologist is Dr.Ennever  At our last visit in February patient complained of some palpitations especially when she laid down at night EKG was stable I ordered a Zio patch which she completed on March 20 NSR with sinus brady (51/min) and sinus tachy (151/min), ave 75/min. 9 episodes of NS SVT, longest 19 beats at 94/min and the fastest 6 beats at 162/min No VT, afib or sustained SVT No prolonged pauses Rare PVC's and PAC's (<1%) One symptoms recorded associated with NSR  She was also seen by cardiology, visit with Dr. Revankar in February-in addition to the Zio patch Earlier this month I touched base with her cardiologist regarding her Zio patch and he suggested a beta-blocker as needed for symptatic  palpitations.  She has been taking her metoprolol  12.5 mg daily  Patient was seen last week for blood pressure check.  Blood pressure was looking fine, she is taking metoprolol  but has concern of continued palpitations  Pt notes she had a "bad day" in early to mid March when she felt  SOB.  She just rested at home She feels like she never quite got back to normal after this 1 day in March She is having a hard time sleeping- she will get SOB when she lays flat - this is new over the last 7-8 weeks  She is still having a lot of palpitations Taking the 12.5 metoprolol  does not seem to be helping her   Most recent echo on chart October 2022: 1. Left ventricular ejection fraction, by estimation, is 55 to 60%. The  left ventricle has normal function. The left ventricle has no regional  wall motion abnormalities. Left ventricular diastolic parameters are  indeterminate.   Patient Active Problem List   Diagnosis Date Noted   Chest pain, unspecified 07/15/2023   Colon cancer (HCC)    Diverticulosis    Memory impairment 07/12/2022   Incisional hernia 09/16/2021   Pain 05/05/2021   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 behavioral disturbance, psychotic disturbance, mood disturbance, or anxiety (HCC) 05/05/2021   Stroke-like symptoms 03/25/2021   Sleep apnea    Rosacea    PONV (postoperative nausea and vomiting)    Nocturia    Neuropathy    Melanoma (HCC)    Hypertension    Hyperlipidemia    Fractured hip (HCC)    Difficulty sleeping    Complication of anesthesia    Cancer (HCC)    Arthritis    Malignant neoplasm of descending colon (HCC)    Gastric polyps    Colonic mass  Diverticulosis of colon without hemorrhage    Adenomatous polyp of sigmoid colon    Acute lower GI bleeding 06/09/2020   Lower GI bleed 06/08/2020   Acute blood loss anemia 06/08/2020   HTN (hypertension) 06/08/2020   Constipation    Encounter for orthopedic follow-up care 06/18/2019   Urinary tract infection 04/20/2019   Acute pain of right wrist 03/28/2019   Tenosynovitis, wrist 03/28/2019   Acquired trigger finger of right little finger 01/15/2019   Carpal tunnel syndrome of right wrist 01/15/2019   Aftercare 08/17/2018   Lumbar post-laminectomy syndrome 07/31/2018   Heartburn    Fracture of  superior pubic ramus (HCC) 06/16/2018   Radial styloid tenosynovitis of left hand 05/16/2018   Pain of left hand 04/20/2018   Osteopenia 04/15/2018   Dyslipidemia 02/02/2018   GERD (gastroesophageal reflux disease) 02/02/2018   Trochanteric bursitis of left hip 12/23/2017   Primary osteoarthritis of left knee 10/17/2017   History of right knee joint replacement 10/17/2017   Pain in both lower extremities 08/02/2017   Stage 1 breast cancer, ER+, right (HCC) 07/04/2017   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   Pain in right hand 06/10/2017   Trigger finger of right hand 06/10/2017   Paresthesia 02/09/2017   Low back pain 02/09/2017   Gait abnormality 02/09/2017   OA (osteoarthritis) of hip 05/09/2015    Past Medical History:  Diagnosis Date   Acquired trigger finger of right little finger 01/15/2019   Acute blood loss anemia 06/08/2020   Acute lower GI bleeding 06/09/2020   Acute pain of right wrist 03/28/2019   Adenomatous polyp of sigmoid colon    Aftercare 08/17/2018   Arthritis    Cancer (HCC)    HX BREAST CANCER/ SKIN CANCER   Carpal tunnel syndrome of right wrist 01/15/2019   Colon cancer (HCC)    Colonic mass    Complication of anesthesia    N/V WITH MORPHINE    Constipation    Difficulty sleeping    Diverticulosis    Diverticulosis of colon without hemorrhage    Dyslipidemia 02/02/2018   Encounter for orthopedic follow-up care 06/18/2019   Fracture of superior pubic ramus (HCC) 06/16/2018   Fractured hip (HCC)    LEFT - AUG 2016   Gait abnormality 02/09/2017   Gastric polyps    GERD (gastroesophageal reflux disease)    Heartburn    History of right knee joint replacement 10/17/2017   HTN (hypertension) 06/08/2020   Hyperlipidemia    Hypertension    Incisional hernia 09/16/2021   Low back pain 02/09/2017   Lower GI bleed 06/08/2020   Lumbar post-laminectomy syndrome 07/31/2018   Malignant neoplasm of descending colon (HCC)    Melanoma (HCC)     Memory impairment 07/12/2022   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 behavioral disturbance, psychotic disturbance, mood disturbance, or anxiety (HCC) 05/05/2021   Neuropathy    Nocturia    OA (osteoarthritis) of hip 05/09/2015   Osteopenia    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   Pain 05/05/2021   Pain in both lower extremities 08/02/2017   Pain in right hand 06/10/2017   Pain of left hand 04/20/2018   Paresthesia 02/09/2017   PONV (postoperative nausea and vomiting)    PT STATES MORPHINE  CAUSED N/V   Primary osteoarthritis of left knee 10/17/2017   Radial styloid tenosynovitis of left hand 05/16/2018   Rosacea    Sleep apnea    Stage 1 breast cancer, ER+, right (HCC) 07/04/2017   Stroke-like symptoms 03/25/2021  Tenosynovitis, wrist 03/28/2019   Trigger finger of right hand 06/10/2017   Trochanteric bursitis of left hip 12/23/2017   Urinary tract infection 04/20/2019    Past Surgical History:  Procedure Laterality Date   24 HOUR PH STUDY N/A 06/21/2018   Procedure: 24 HOUR PH STUDY;  Surgeon: Annis Kinder, DO;  Location: [facility];  Service: Gastroenterology;  Laterality: N/A;  with impedance   ABDOMINAL HYSTERECTOMY  2010   ANAL RECTAL MANOMETRY N/A 09/19/2019   Procedure: ANO RECTAL MANOMETRY;  Surgeon: Sergio Dandy, MD;  Location: [facility];  Service: Endoscopy;  Laterality: N/A;   APPENDECTOMY  1949   BACK SURGERY  1973   BIOPSY  06/10/2020   Procedure: BIOPSY;  Surgeon: Annis Kinder, DO;  Location: [facility];  Service: Gastroenterology;;  EGD and COLON   BREAST SURGERY     CATARACT EXTRACTION Bilateral 2011   CHOLECYSTECTOMY     COLON RESECTION N/A 06/12/2020   Procedure: LAPAROSCOPIC COLON RESECTION;  Surgeon: Oza Blumenthal, MD;  Location: [facility];  Service: General;  Laterality: N/A;   COLONOSCOPY WITH PROPOFOL  N/A 06/10/2020   Procedure: COLONOSCOPY WITH PROPOFOL ;  Surgeon: Annis Kinder, DO;  Location: [facility];  Service:  Gastroenterology;  Laterality: N/A;   ESOPHAGEAL MANOMETRY N/A 06/21/2018   Procedure: ESOPHAGEAL MANOMETRY (EM);  Surgeon: Annis Kinder, DO;  Location: [facility];  Service: Gastroenterology;  Laterality: N/A;   ESOPHAGOGASTRODUODENOSCOPY (EGD) WITH PROPOFOL  N/A 06/10/2020   Procedure: ESOPHAGOGASTRODUODENOSCOPY (EGD) WITH PROPOFOL ;  Surgeon: Annis Kinder, DO;  Location: [facility];  Service: Gastroenterology;  Laterality: N/A;   GALLBLADDER SURGERY  2015   INCISIONAL HERNIA REPAIR N/A 09/16/2021   Procedure: OPEN INCISIONAL HERNIA REPAIR WITH MESH;  Surgeon: Oza Blumenthal, MD;  Location: [facility];  Service: General;  Laterality: N/A;   JOINT REPLACEMENT     RT TOTAL HIP / RT TOTAL KNEE   MASTECTOMY  1998   BILATERAL    PH IMPEDANCE STUDY  06/21/2018   Procedure: PH IMPEDANCE STUDY;  Surgeon: Annis Kinder, DO;  Location: [facility];  Service: Gastroenterology;;   POLYPECTOMY  06/10/2020   Procedure: POLYPECTOMY;  Surgeon: Annis Kinder, DO;  Location: [facility];  Service: Gastroenterology;;   SKIN CANCER EXCISION  2016   RT SIDE OF NOSE   SUBMUCOSAL TATTOO INJECTION  06/10/2020   Procedure: SUBMUCOSAL TATTOO INJECTION;  Surgeon: Annis Kinder, DO;  Location: [facility];  Service: Gastroenterology;;   TONSILLECTOMY  1953   TOTAL HIP ARTHROPLASTY  2010   RIGHT   TOTAL HIP ARTHROPLASTY Left 05/09/2015   Procedure: LEFT TOTAL HIP ARTHROPLASTY ANTERIOR APPROACH;  Surgeon: Liliane Rei, MD;  Location: [facility];  Service: Orthopedics;  Laterality: Left;   TOTAL KNEE ARTHROPLASTY  2001   TUMOR REMOVAL  2012   ABDOMINAL - NON CANCEROUS    Social History   Tobacco Use   Smoking status: Former    Current packs/day: 0.00    Average packs/day: 0.5 packs/day for 20.0 years (10.0 ttl pk-yrs)    Types: Cigarettes    Start date: 02/03/1956    Quit date: 02/03/1976    Years since quitting: 47.6   Smokeless tobacco: Never  Vaping Use   Vaping status: Never Used   Substance Use Topics   Alcohol use: No   Drug use: No    Family History  Problem Relation Age of Onset   Other Mother        complications from flu  Other Father        unsure of cause   Colon cancer Neg Hx     Allergies  Allergen Reactions   Morphine  Nausea And Vomiting   Propoxyphene Nausea And Vomiting, Palpitations and Other (See Comments)    Darvocet Causes Sweats   Donepezil  Diarrhea and Other (See Comments)   Tape Rash and Other (See Comments)    Medication list has been reviewed and updated.  Current Outpatient Medications on File Prior to Visit  Medication Sig Dispense Refill   aspirin  EC 81 MG tablet Take 81 mg by mouth daily. Swallow whole.     atorvastatin  (LIPITOR) 20 MG tablet Take 1 tablet (20 mg total) by mouth daily. 90 tablet 3   estradiol  (ESTRACE ) 0.1 MG/GM vaginal cream Place 0.5 g vaginally 2 (two) times a week. Place 0.5g nightly for two weeks then twice a week after 42.5 g 11   HYDROcodone -acetaminophen  (NORCO) 10-325 MG tablet Take 1 tablet by mouth every 6 (six) hours as needed. 120 tablet 0   ipratropium (ATROVENT ) 0.06 % nasal spray Place 2 sprays into both nostrils 3 (three) times daily. 15 mL 5   losartan  (COZAAR ) 25 MG tablet Take 1 tablet (25 mg total) by mouth daily. (Patient taking differently: Take 37.5 mg by mouth daily.) 90 tablet 3   memantine  (NAMENDA ) 5 MG tablet Take 1 tablet (5 mg total) by mouth 2 (two) times daily. 180 tablet 3   metoprolol  tartrate (LOPRESSOR ) 25 MG tablet Take 12.5 mg (half tablet) in the morning as needed for palpitations 45 tablet 2   mirabegron  ER (MYRBETRIQ ) 50 MG TB24 tablet Take 1 tablet (50 mg total) by mouth daily. 30 tablet 11   Multiple Vitamins-Minerals (MULTIVITAMIN ADULTS) TABS Take 1 tablet by mouth daily.     ondansetron  (ZOFRAN -ODT) 4 MG disintegrating tablet Take 1 tablet (4 mg total) by mouth every 8 (eight) hours as needed for nausea or vomiting. 30 tablet 1   vitamin B-12 (CYANOCOBALAMIN ) 1000  MCG tablet Take 1,000 mcg by mouth daily.     Current Facility-Administered Medications on File Prior to Visit  Medication Dose Route Frequency Provider Last Rate Last Admin   lidocaine  (XYLOCAINE ) 2 % jelly 1 Application  1 Application Urethral Once         Review of Systems:  As per HPI- otherwise negative.   Physical Examination: Vitals:   09/26/23 1114  BP: 126/76  Pulse: 74  Resp: 18  Temp: 98.4 F (36.9 C)  SpO2: 97%   Vitals:   09/26/23 1114  Weight: 208 lb (94.3 kg)  Height: 5\' 6"  (1.676 m)   Body mass index is 33.57 kg/m. Ideal Body Weight: Weight in (lb) to have BMI = 25: 154.6  GEN: no acute distress.  Mildly obese, looks well.  He uses a rolling walker HEENT: Atraumatic, Normocephalic.  Ears and Nose: No external deformity. CV: RRR, No M/G/R. No JVD. No thrill. No extra heart sounds. 1. Left ventricular ejection fraction, by estimation, is 55 to 60%. The  left ventricle has normal function. The left ventricle has no regional  wall motion abnormalities. Left ventricular diastolic parameters are  indeterminate.  PULM: CTA B, no wheezes, crackles, rhonchi. No retractions. No resp. distress. No accessory muscle use.   ABD: S, NT, ND, +BS. No rebound. No HSM. EXTR: No c/c/e PSYCH: Normally interactive. Conversant.    Assessment and Plan: Other fatigue - Plan: Vitamin B12, Basic metabolic panel with GFR, CBC, Urine Culture  Orthopnea -  Plan: B Nat Peptide, DG Chest 2 View, D-Dimer, Quantitative   Patient seen today with nonspecific fatigue and feeling poorly for the last 7 or 8 weeks.  She does have history of palpitations for the last several months, Zio patch completed as above.  She is using metoprolol  to suppress her SVT which is helping some but she still does not feel very well.  She notes a particular day several weeks ago when she felt quite bad, she is improved but still not back to normal.  She also notes orthopnea which I believe is a new  complaint  Lab work is pending as above, we will check her B12.  Vitamin D  is always been normal.  History of frequent UTI, check urine culture Will obtain a chest x-ray and BNP due to history of orthopnea.  Will check a D-dimer as she has endorsed some shortness of breath although not chest pain   Signed Gates Kasal, MD  Received her labs and chest x-ray as below 4/29 D-dimer and urine culture are still pending-we will call her when these come in  Results for orders placed or performed in visit on 09/26/23  Urine Culture   Collection Time: 09/26/23 12:13 PM   Specimen: Urine  Result Value Ref Range   MICRO NUMBER: [id number]    SPECIMEN QUALITY: Adequate    Sample Source URINE    STATUS: FINAL    Result:      Less than 10,000 CFU/mL of single Gram negative organism isolated. No further testing will be performed. If clinically indicated, recollection using a method to minimize contamination, with prompt transfer to Urine Culture Transport Tube, is recommended.  Vitamin B12   Collection Time: 09/26/23 12:13 PM  Result Value Ref Range   Vitamin B-12 790 211 - 911 pg/mL  B Nat Peptide   Collection Time: 09/26/23 12:13 PM  Result Value Ref Range   Pro B Natriuretic peptide (BNP) 73.0 0.0 - 100.0 pg/mL  Basic metabolic panel with GFR   Collection Time: 09/26/23 12:13 PM  Result Value Ref Range   Sodium 137 135 - 145 mEq/L   Potassium 4.6 3.5 - 5.1 mEq/L   Chloride 102 96 - 112 mEq/L   CO2 29 19 - 32 mEq/L   Glucose, Bld 95 70 - 99 mg/dL   BUN 15 6 - 23 mg/dL   Creatinine, Ser 4.78 0.40 - 1.20 mg/dL   GFR 29.56 >21.30 mL/min   Calcium  9.3 8.4 - 10.5 mg/dL  CBC   Collection Time: 09/26/23 12:13 PM  Result Value Ref Range   WBC 6.0 4.0 - 10.5 K/uL   RBC 4.31 3.87 - 5.11 Mil/uL   Platelets 128.0 (L) 150.0 - 400.0 K/uL   Hemoglobin 13.0 12.0 - 15.0 g/dL   HCT 86.5 78.4 - 69.6 %   MCV 92.5 78.0 - 100.0 fl   MCHC 32.7 30.0 - 36.0 g/dL   RDW 29.5 28.4 - 13.2 %  D-Dimer,  Quantitative   Collection Time: 09/26/23 12:13 PM  Result Value Ref Range   D-Dimer, Quant 0.50 (H) <0.50 mcg/mL FEU      Addendum 4/30, received D-dimer which is normal for age Called and [facility] Will followup with urine culture   Received her urine culture- negative

## 2023-09-26 ENCOUNTER — Ambulatory Visit (HOSPITAL_BASED_OUTPATIENT_CLINIC_OR_DEPARTMENT_OTHER)
Admission: RE | Admit: 2023-09-26 | Discharge: 2023-09-26 | Disposition: A | Discharge status: Home / Self Care | Source: Ambulatory Visit | Attending: Family Medicine | Admitting: Family Medicine

## 2023-09-26 ENCOUNTER — Ambulatory Visit (INDEPENDENT_AMBULATORY_CARE_PROVIDER_SITE_OTHER): Admitting: Family Medicine

## 2023-09-26 VITALS — BP 126/76 | HR 74 | Temp 98.4°F | Resp 18 | Ht 66.0 in | Wt 208.0 lb

## 2023-09-26 DIAGNOSIS — R0601 Orthopnea: Secondary | ICD-10-CM | POA: Diagnosis not present

## 2023-09-26 DIAGNOSIS — I10 Essential (primary) hypertension: Secondary | ICD-10-CM | POA: Diagnosis not present

## 2023-09-26 DIAGNOSIS — R5383 Other fatigue: Secondary | ICD-10-CM | POA: Diagnosis not present

## 2023-09-26 DIAGNOSIS — I700 Atherosclerosis of aorta: Secondary | ICD-10-CM | POA: Diagnosis not present

## 2023-09-26 LAB — CBC
HCT: 39.9 % (ref 36.0–46.0)
Hemoglobin: 13 g/dL (ref 12.0–15.0)
MCHC: 32.7 g/dL (ref 30.0–36.0)
MCV: 92.5 fl (ref 78.0–100.0)
Platelets: 128 10*3/uL — ABNORMAL LOW (ref 150.0–400.0)
RBC: 4.31 Mil/uL (ref 3.87–5.11)
RDW: 12.8 % (ref 11.5–15.5)
WBC: 6 10*3/uL (ref 4.0–10.5)

## 2023-09-26 LAB — BASIC METABOLIC PANEL WITH GFR
BUN: 15 mg/dL (ref 6–23)
CO2: 29 meq/L (ref 19–32)
Calcium: 9.3 mg/dL (ref 8.4–10.5)
Chloride: 102 meq/L (ref 96–112)
Creatinine, Ser: 0.59 mg/dL (ref 0.40–1.20)
GFR: 80.73 mL/min (ref >60.00)
Glucose, Bld: 95 mg/dL (ref 70–99)
Potassium: 4.6 meq/L (ref 3.5–5.1)
Sodium: 137 meq/L (ref 135–145)

## 2023-09-26 LAB — VITAMIN B12: Vitamin B-12: 790 pg/mL (ref 211–911)

## 2023-09-26 LAB — BRAIN NATRIURETIC PEPTIDE: Pro B Natriuretic peptide (BNP): 73 pg/mL (ref 0.0–100.0)

## 2023-09-26 NOTE — Patient Instructions (Addendum)
 Good to see you today- I am sorry you are not feeling great!  I will be in touch with your labs and chest film asap  If your D dimer test is positive I will get a CT of your chest  I will see if Dr Audery Blazing can see you at the [facility] cardiology office  Your recent heart monitor showed "SVT" or supraventricular tachycardia- this is a common finding which may cause palpitations

## 2023-09-28 ENCOUNTER — Other Ambulatory Visit (HOSPITAL_COMMUNITY): Payer: Self-pay

## 2023-09-28 LAB — D-DIMER, QUANTITATIVE: D-Dimer, Quant: 0.5 ug{FEU}/mL — ABNORMAL HIGH (ref <0.50)

## 2023-09-28 LAB — URINE CULTURE
MICRO NUMBER:: 16388995
SPECIMEN QUALITY:: ADEQUATE

## 2023-09-30 ENCOUNTER — Telehealth: Payer: Self-pay | Admitting: *Deleted

## 2023-09-30 NOTE — Telephone Encounter (Signed)
-----   Message from Alexandria Angel sent at 09/26/2023 12:26 PM EDT ----- I can see her; will have Abran Abrahams make fu appt Alexandria Angel ----- Message ----- From: Kaylee Partridge, MD Sent: 09/26/2023  12:25 PM EDT To: Lenise Quince, MD  Hi Brian-are you still coming out to the [facility] to see patients?  Annye asked about switching to see you from Dr. Revankar.  She is a reasonable person and likes Burrell Casa, but notes difficulty with them understanding each other (English is also not her first language and she notes Dr Lafayette Pierre is quite soft spoken!)  I don't want to rock the boat but thought I would ask if you might be able to take over her case  Thanks, if changing provider is awkward I can send her to [facility]

## 2023-09-30 NOTE — Telephone Encounter (Signed)
 Left message for pt to call, she has a follow up for 1 year. Want to make sure that time line is okay.

## 2023-10-04 ENCOUNTER — Other Ambulatory Visit (HOSPITAL_COMMUNITY)
Admission: RE | Admit: 2023-10-04 | Discharge: 2023-10-04 | Disposition: A | Discharge status: Home / Self Care | Source: Other Acute Inpatient Hospital | Attending: Obstetrics and Gynecology | Admitting: Obstetrics and Gynecology

## 2023-10-04 ENCOUNTER — Ambulatory Visit (INDEPENDENT_AMBULATORY_CARE_PROVIDER_SITE_OTHER): Admitting: Obstetrics and Gynecology

## 2023-10-04 VITALS — BP 135/73 | HR 67

## 2023-10-04 DIAGNOSIS — N390 Urinary tract infection, site not specified: Secondary | ICD-10-CM

## 2023-10-04 DIAGNOSIS — R82998 Other abnormal findings in urine: Secondary | ICD-10-CM

## 2023-10-04 DIAGNOSIS — R350 Frequency of micturition: Secondary | ICD-10-CM | POA: Diagnosis not present

## 2023-10-04 DIAGNOSIS — R319 Hematuria, unspecified: Secondary | ICD-10-CM

## 2023-10-04 DIAGNOSIS — N3281 Overactive bladder: Secondary | ICD-10-CM

## 2023-10-04 LAB — URINALYSIS, MICROSCOPIC (REFLEX): WBC, UA: >50 WBC/hpf (ref 0–5)

## 2023-10-04 LAB — POCT URINALYSIS DIPSTICK
Bilirubin, UA: NEGATIVE
Glucose, UA: NEGATIVE
Ketones, UA: NEGATIVE
Nitrite, UA: NEGATIVE
Protein, UA: NEGATIVE
Spec Grav, UA: 1.01 (ref 1.010–1.025)
Urobilinogen, UA: 0.2 U/dL
pH, UA: 5.5 (ref 5.0–8.0)

## 2023-10-04 LAB — URINALYSIS, ROUTINE W REFLEX MICROSCOPIC
Bilirubin Urine: NEGATIVE
Glucose, UA: NEGATIVE mg/dL
Ketones, ur: NEGATIVE mg/dL
Nitrite: NEGATIVE
Protein, ur: NEGATIVE mg/dL
Specific Gravity, Urine: 1.02 (ref 1.005–1.030)
pH: 6 (ref 5.0–8.0)

## 2023-10-04 MED ORDER — NITROFURANTOIN MONOHYD MACRO 100 MG PO CAPS: 100.0000 mg | ORAL_CAPSULE | Freq: Two times a day (BID) | ORAL | 0 refills | Status: AC

## 2023-10-04 NOTE — Progress Notes (Signed)
 [facility]  PTNS VISIT  CC:  Overactive bladder  89 y.o. with refractory overactive bladder who presents for percutaneous tibial nerve stimulation. The patient presents for PTNS session # 2.   Procedure: The patient was placed in the sitting position and the left lower extremity was prepped in the usual fashion. The PTNS needle was then inserted at a 60 degree angle, 5 cm cephalad and 2 cm posterior to the medial malleolus. The PTNS unit was then programmed and an optimal response was noted at 10 milliamps. The PTNS stimulation was then performed at this setting for 30 minutes without incident and the patient tolerated the procedure well. The needle was removed and hemostasis was noted.    Lab Results  Component Value Date   COLORU Yellow 10/04/2023   CLARITYU Cloudy 10/04/2023   GLUCOSEUR Negative 10/04/2023   BILIRUBINUR Negative 10/04/2023   KETONESU Negative 10/04/2023   SPECGRAV 1.010 10/04/2023   RBCUR Small 10/04/2023   PHUR 5.5 10/04/2023   PROTEINUR Negative 10/04/2023   UROBILINOGEN 0.2 10/04/2023   LEUKOCYTESUR Large (3+) (A) 10/04/2023    Patient has reported symptoms of UTI and took her Fosfomycin dose on Sunday without relief of symptoms. Large leukocytes and small blood on POC urine. Will send in Macrobid  based on last culture while we await culture results.   The pt will return in 2 weeks for PTNS session # 3. All questions were answered.

## 2023-10-05 LAB — URINE CULTURE: Culture: <10000 — AB

## 2023-10-05 NOTE — Telephone Encounter (Signed)
 Recall placed

## 2023-10-19 DIAGNOSIS — L408 Other psoriasis: Secondary | ICD-10-CM | POA: Diagnosis not present

## 2023-10-19 DIAGNOSIS — L814 Other melanin hyperpigmentation: Secondary | ICD-10-CM | POA: Diagnosis not present

## 2023-10-19 DIAGNOSIS — L821 Other seborrheic keratosis: Secondary | ICD-10-CM | POA: Diagnosis not present

## 2023-10-19 DIAGNOSIS — D225 Melanocytic nevi of trunk: Secondary | ICD-10-CM | POA: Diagnosis not present

## 2023-10-27 ENCOUNTER — Encounter: Attending: Physical Medicine & Rehabilitation | Admitting: Registered Nurse

## 2023-10-27 ENCOUNTER — Encounter: Payer: Self-pay | Admitting: Registered Nurse

## 2023-10-27 VITALS — BP 126/72 | HR 68 | Ht 66.0 in | Wt 208.4 lb

## 2023-10-27 DIAGNOSIS — M7061 Trochanteric bursitis, right hip: Secondary | ICD-10-CM | POA: Insufficient documentation

## 2023-10-27 DIAGNOSIS — M170 Bilateral primary osteoarthritis of knee: Secondary | ICD-10-CM | POA: Diagnosis not present

## 2023-10-27 DIAGNOSIS — G8929 Other chronic pain: Secondary | ICD-10-CM | POA: Insufficient documentation

## 2023-10-27 DIAGNOSIS — G894 Chronic pain syndrome: Secondary | ICD-10-CM | POA: Diagnosis not present

## 2023-10-27 DIAGNOSIS — M7062 Trochanteric bursitis, left hip: Secondary | ICD-10-CM | POA: Insufficient documentation

## 2023-10-27 DIAGNOSIS — Z79891 Long term (current) use of opiate analgesic: Secondary | ICD-10-CM | POA: Diagnosis not present

## 2023-10-27 DIAGNOSIS — Z5181 Encounter for therapeutic drug level monitoring: Secondary | ICD-10-CM | POA: Diagnosis not present

## 2023-10-27 DIAGNOSIS — M545 Low back pain, unspecified: Secondary | ICD-10-CM | POA: Insufficient documentation

## 2023-10-27 DIAGNOSIS — M2550 Pain in unspecified joint: Secondary | ICD-10-CM | POA: Insufficient documentation

## 2023-10-27 DIAGNOSIS — R202 Paresthesia of skin: Secondary | ICD-10-CM | POA: Insufficient documentation

## 2023-10-27 MED ORDER — HYDROCODONE-ACETAMINOPHEN 10-325 MG PO TABS: 1.0000 | ORAL_TABLET | Freq: Four times a day (QID) | ORAL | 0 refills | Status: DC | PRN

## 2023-10-27 NOTE — Progress Notes (Signed)
 Subjective:    Patient ID: Crystal Lee, female    DOB: Jul 19, 1933, 89 y.o.   MRN: [id number]  HPI: [name] is a 89 y.o. female who returns for follow up appointment for chronic pain and medication refill. She states her pain is located in her mid- lower back, bilateral hips, bilateral lower extremities with tingling and bilateral knee pain. She rates her pain 10. Her current exercise regime is walking and performing stretching exercises.  Ms. Uffelman Morphine  equivalent is 40.00 MME.   Last Oral Swab was Performed on 08/02/2023, it was consistent.      Pain Inventory Average Pain 7 Pain Right Now 10 My pain is constant, sharp, stabbing, and tingling  In the last 24 hours, has pain interfered with the following? General activity 6 Relation with others 10 Enjoyment of life 6 What TIME of day is your pain at its worst? night Sleep (in general) Poor  Pain is worse with: walking and standing Pain improves with: rest, heat/ice, and medication Relief from Meds: 3  Family History  Problem Relation Age of Onset   Other Mother        complications from flu   Other Father        unsure of cause   Colon cancer Neg Hx    Social History   Socioeconomic History   Marital status: Widowed    Spouse name: Not on file   Number of children: 3   Years of education: 16 years   Highest education level: Not on file  Occupational History   Occupation: Retired  Tobacco Use   Smoking status: Former    Current packs/day: 0.00    Average packs/day: 0.5 packs/day for 20.0 years (10.0 ttl pk-yrs)    Types: Cigarettes    Start date: 02/03/1956    Quit date: 02/03/1976    Years since quitting: 47.7   Smokeless tobacco: Never  Vaping Use   Vaping status: Never Used  Substance and Sexual Activity   Alcohol use: No   Drug use: No   Sexual activity: Not Currently    Birth control/protection: Post-menopausal  Other Topics Concern   Not on file  Social History Narrative   Lives at home  with husband.   Right-handed.   No caffeine use.   retired   Teacher, early years/pre Strain: Low Risk  (09/02/2023)   Overall Financial Resource Strain (CARDIA)    Difficulty of Paying Living Expenses: Not hard at all  Food Insecurity: No Food Insecurity (09/02/2023)   Hunger Vital Sign    Worried About Running Out of Food in the Last Year: Never true    Ran Out of Food in the Last Year: Never true  Transportation Needs: No Transportation Needs (09/02/2023)   PRAPARE - Administrator, Civil Service (Medical): No    Lack of Transportation (Non-Medical): No  Physical Activity: Inactive (09/02/2023)   Exercise Vital Sign    Days of Exercise per Week: 0 days    Minutes of Exercise per Session: 0 min  Stress: No Stress Concern Present (09/02/2023)   [name] of Occupational Health - Occupational Stress Questionnaire    Feeling of Stress : Only a little  Social Connections: Moderately Isolated (09/02/2023)   Social Connection and Isolation Panel [NHANES]    Frequency of Communication with Friends and Family: Twice a week    Frequency of Social Gatherings with Friends and Family: Three times a week  Attends Religious Services: More than 4 times per year    Active Member of Clubs or Organizations: No    Attends Banker Meetings: Never    Marital Status: Widowed   Past Surgical History:  Procedure Laterality Date   75 HOUR PH STUDY N/A 06/21/2018   Procedure: 24 HOUR PH STUDY;  Surgeon: Annis Kinder, DO;  [facility]: [facility];  Service: Gastroenterology;  Laterality: N/A;  with impedance   ABDOMINAL HYSTERECTOMY  2010   ANAL RECTAL MANOMETRY N/A 09/19/2019   Procedure: ANO RECTAL MANOMETRY;  Surgeon: Sergio Dandy, MD;  [facility]: [facility];  Service: Endoscopy;  Laterality: N/A;   APPENDECTOMY  1949   BACK SURGERY  1973   BIOPSY  06/10/2020   Procedure: BIOPSY;  Surgeon: Annis Kinder, DO;  [facility];   Service: Gastroenterology;;  EGD and COLON   BREAST SURGERY     CATARACT EXTRACTION Bilateral 2011   CHOLECYSTECTOMY     COLON RESECTION N/A 06/12/2020   Procedure: LAPAROSCOPIC COLON RESECTION;  Surgeon: Oza Blumenthal, MD;  [facility]: [facility];  Service: General;  Laterality: N/A;   COLONOSCOPY WITH PROPOFOL  N/A 06/10/2020   Procedure: COLONOSCOPY WITH PROPOFOL ;  Surgeon: Annis Kinder, DO;  [facility];  Service: Gastroenterology;  Laterality: N/A;   ESOPHAGEAL MANOMETRY N/A 06/21/2018   Procedure: ESOPHAGEAL MANOMETRY (EM);  Surgeon: Annis Kinder, DO;  [facility];  Service: Gastroenterology;  Laterality: N/A;   ESOPHAGOGASTRODUODENOSCOPY (EGD) WITH PROPOFOL  N/A 06/10/2020   Procedure: ESOPHAGOGASTRODUODENOSCOPY (EGD) WITH PROPOFOL ;  Surgeon: Annis Kinder, DO;  [facility];  Service: Gastroenterology;  Laterality: N/A;   GALLBLADDER SURGERY  2015   INCISIONAL HERNIA REPAIR N/A 09/16/2021   Procedure: OPEN INCISIONAL HERNIA REPAIR WITH MESH;  Surgeon: Oza Blumenthal, MD;  [facility];  Service: General;  Laterality: N/A;   JOINT REPLACEMENT     RT TOTAL HIP / RT TOTAL KNEE   MASTECTOMY  1998   BILATERAL    PH IMPEDANCE STUDY  06/21/2018   Procedure: PH IMPEDANCE STUDY;  Surgeon: Annis Kinder, DO;  [facility];  Service: Gastroenterology;;   POLYPECTOMY  06/10/2020   Procedure: POLYPECTOMY;  Surgeon: Annis Kinder, DO;  [facility];  Service: Gastroenterology;;   SKIN CANCER EXCISION  2016   RT SIDE OF NOSE   SUBMUCOSAL TATTOO INJECTION  06/10/2020   Procedure: SUBMUCOSAL TATTOO INJECTION;  Surgeon: Annis Kinder, DO;  [facility];  Service: Gastroenterology;;   TONSILLECTOMY  1953   TOTAL HIP ARTHROPLASTY  2010   RIGHT   TOTAL HIP ARTHROPLASTY Left 05/09/2015   Procedure: LEFT TOTAL HIP ARTHROPLASTY ANTERIOR APPROACH;  Surgeon: Liliane Rei, MD;  [facility];  Service: Orthopedics;   Laterality: Left;   TOTAL KNEE ARTHROPLASTY  2001   TUMOR REMOVAL  2012   ABDOMINAL - NON CANCEROUS   Past Surgical History:  Procedure Laterality Date   62 HOUR PH STUDY N/A 06/21/2018   Procedure: 24 HOUR PH STUDY;  Surgeon: Annis Kinder, DO;  [facility];  Service: Gastroenterology;  Laterality: N/A;  with impedance   ABDOMINAL HYSTERECTOMY  2010   ANAL RECTAL MANOMETRY N/A 09/19/2019   Procedure: ANO RECTAL MANOMETRY;  Surgeon: Sergio Dandy, MD;  [facility];  Service: Endoscopy;  Laterality: N/A;   APPENDECTOMY  1949   BACK SURGERY  1973   BIOPSY  06/10/2020   Procedure: BIOPSY;  Surgeon: Annis Kinder, DO;  [facility];  Service: Gastroenterology;;  EGD and COLON   BREAST SURGERY     CATARACT EXTRACTION Bilateral 2011   CHOLECYSTECTOMY     COLON RESECTION N/A 06/12/2020   Procedure: LAPAROSCOPIC COLON RESECTION;  Surgeon: Oza Blumenthal, MD;  [facility];  Service: General;  Laterality: N/A;   COLONOSCOPY WITH PROPOFOL  N/A 06/10/2020   Procedure: COLONOSCOPY WITH PROPOFOL ;  Surgeon: Annis Kinder, DO;  [facility];  Service: Gastroenterology;  Laterality: N/A;   ESOPHAGEAL MANOMETRY N/A 06/21/2018   Procedure: ESOPHAGEAL MANOMETRY (EM);  Surgeon: Annis Kinder, DO;  [facility];  Service: Gastroenterology;  Laterality: N/A;   ESOPHAGOGASTRODUODENOSCOPY (EGD) WITH PROPOFOL  N/A 06/10/2020   Procedure: ESOPHAGOGASTRODUODENOSCOPY (EGD) WITH PROPOFOL ;  Surgeon: Annis Kinder, DO;  [facility];  Service: Gastroenterology;  Laterality: N/A;   GALLBLADDER SURGERY  2015   INCISIONAL HERNIA REPAIR N/A 09/16/2021   Procedure: OPEN INCISIONAL HERNIA REPAIR WITH MESH;  Surgeon: Oza Blumenthal, MD;  [facility];  Service: General;  Laterality: N/A;   JOINT REPLACEMENT     RT TOTAL HIP / RT TOTAL KNEE   MASTECTOMY  1998   BILATERAL    PH IMPEDANCE STUDY  06/21/2018   Procedure: PH  IMPEDANCE STUDY;  Surgeon: Annis Kinder, DO;  [facility];  Service: Gastroenterology;;   POLYPECTOMY  06/10/2020   Procedure: POLYPECTOMY;  Surgeon: Annis Kinder, DO;  [facility];  Service: Gastroenterology;;   SKIN CANCER EXCISION  2016   RT SIDE OF NOSE   SUBMUCOSAL TATTOO INJECTION  06/10/2020   Procedure: SUBMUCOSAL TATTOO INJECTION;  Surgeon: Annis Kinder, DO;  [facility];  Service: Gastroenterology;;   TONSILLECTOMY  1953   TOTAL HIP ARTHROPLASTY  2010   RIGHT   TOTAL HIP ARTHROPLASTY Left 05/09/2015   Procedure: LEFT TOTAL HIP ARTHROPLASTY ANTERIOR APPROACH;  Surgeon: Liliane Rei, MD;  [facility];  Service: Orthopedics;  Laterality: Left;   TOTAL KNEE ARTHROPLASTY  2001   TUMOR REMOVAL  2012   ABDOMINAL - NON CANCEROUS   Past Medical History:  Diagnosis Date   Acquired trigger finger of right little finger 01/15/2019   Acute blood loss anemia 06/08/2020   Acute lower GI bleeding 06/09/2020   Acute pain of right wrist 03/28/2019   Adenomatous polyp of sigmoid colon    Aftercare 08/17/2018   Arthritis    Cancer (HCC)    HX BREAST CANCER/ SKIN CANCER   Carpal tunnel syndrome of right wrist 01/15/2019   Colon cancer (HCC)    Colonic mass    Complication of anesthesia    N/V WITH MORPHINE    Constipation    Difficulty sleeping    Diverticulosis    Diverticulosis of colon without hemorrhage    Dyslipidemia 02/02/2018   Encounter for orthopedic follow-up care 06/18/2019   Fracture of superior pubic ramus (HCC) 06/16/2018   Fractured hip (HCC)    LEFT - AUG 2016   Gait abnormality 02/09/2017   Gastric polyps    GERD (gastroesophageal reflux disease)    Heartburn    History of right knee joint replacement 10/17/2017   HTN (hypertension) 06/08/2020   Hyperlipidemia    Hypertension    Incisional hernia 09/16/2021   Low back pain 02/09/2017   Lower GI bleed 06/08/2020   Lumbar post-laminectomy syndrome 07/31/2018    Malignant neoplasm of descending colon (HCC)    Melanoma (HCC)    Memory impairment 07/12/2022   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  behavioral disturbance, psychotic disturbance, mood disturbance, or anxiety (HCC) 05/05/2021   Neuropathy    Nocturia    OA (osteoarthritis) of hip 05/09/2015   Osteopenia    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   Pain 05/05/2021   Pain in both lower extremities 08/02/2017   Pain in right hand 06/10/2017   Pain of left hand 04/20/2018   Paresthesia 02/09/2017   PONV (postoperative nausea and vomiting)    PT STATES MORPHINE  CAUSED N/V   Primary osteoarthritis of left knee 10/17/2017   Radial styloid tenosynovitis of left hand 05/16/2018   Rosacea    Sleep apnea    Stage 1 breast cancer, ER+, right (HCC) 07/04/2017   Stroke-like symptoms 03/25/2021   Tenosynovitis, wrist 03/28/2019   Trigger finger of right hand 06/10/2017   Trochanteric bursitis of left hip 12/23/2017   Urinary tract infection 04/20/2019   BP 126/72 (BP Location: Left Arm, Patient Position: Sitting)   Pulse 68   Ht 5\' 6"  (1.676 m)   Wt 208 lb 6.4 oz (94.5 kg)   SpO2 92%   BMI 33.64 kg/m   Opioid Risk Score:   Fall Risk Score:  `1  Depression screen [facility] 2/9     10/27/2023   11:52 AM 09/02/2023    1:16 PM 09/01/2023   11:55 AM 08/02/2023   11:33 AM 05/30/2023    1:26 PM 04/11/2023   11:04 AM 02/14/2023   11:05 AM  Depression screen [facility] 2/9  Decreased Interest 0 0 0 0 1 0 0  Down, Depressed, Hopeless 0 0 0 0 0 0 0  PHQ - 2 Score 0 0 0 0 1 0 0    Review of Systems     Objective:   Physical Exam Vitals and nursing note reviewed.  Constitutional:      Appearance: Normal appearance.  Cardiovascular:     Rate and Rhythm: Normal rate and regular rhythm.     Pulses: Normal pulses.     Heart sounds: Normal heart sounds.  Pulmonary:     Effort: Pulmonary effort is normal.     Breath sounds: Normal breath sounds.  Musculoskeletal:     Comments: Normal Muscle Bulk and  Muscle Testing Reveals:  Upper Extremities: Full ROM and Muscle Strength 5/5 Lumbar Paraspinal Tenderness: L-4-L-5 Bilateral Greater Trochanter Tenderness Lower Extremities: Decreased ROM and Muscle Strength 5/5 Bilateral Lower Extremities Flexion Produces Pain into her Bilateral Patella's Arises from Chair slowly using Walker for support Narrow Based  Gait     Skin:    General: Skin is warm and dry.  Neurological:     Mental Status: She is alert and oriented to person, place, and time.  Psychiatric:        Mood and Affect: Mood normal.        Behavior: Behavior normal.           Assessment & Plan:  1.Chronic Bilateral :Low Back Pain  Leg pain: Paresthesia: Continue HEP as Tolerated. Continue to Monitor. 10/27/2023 2. Paresthesia Nerissa Bannister Radiculitis: Ms. Bergquist has weaned herself off the Lyrica  due to daytime drowsiness. We will continue to monitor. 10/27/2023 3. Pain of Left Wrist/ : No complaints today. S/P Carpal Tunnel Release on 06/03/2017 by Dr. Ellery Guthrie. Dr. Ellery Guthrie Following. 10/27/2023. 4. Fracture of superior pubic ramus.  Dr. France Ina Following.  Continue to monitor. 10/27/2023. 5. Bilateral Knee OA: Continue Voltaren  Gel.  Continue to monitor. Orthopedist following. 10/27/2023 6. Polyarthralgia: Continue to alternate with heat and ice therapy. Continue current  medication regime. Continue to monitor. 10/27/2023. 7. Chronic Pain Syndrome: Refilled::Hydrocodone   10/325 one tablet 4 times a day as needed for pain #120. Second script sent for the following month.  We will continue the opioid monitoring program, this consists of regular clinic visits, examinations, urine drug screen, pill counts as well as use of [place]  Controlled Substance Reporting system. A 12 month History has Lee reviewed on the [place]  Controlled Substance Reporting System on 10/27/2023. 8. Lumbar Compression Fracture L2 and L3:  Ms. Ortman refused physical therapy. Continue with rest/ heat  therapy. Continue to Monitor 10/27/2023. 9. Muscle Spasm: Continue  Flexeril  5 mg at HS. Continue to monitor. 10/27/2023 10. Right Shoulder Pain: No complaints today. Continue HEP as Tolerated. Alternate Ice and Heat Therapy. 10/27/2023  11. Bilateral  Greater Trochanter Bursitis:  Continue to Monitor. Continue HEP as Tolerated. 10/27/2023     F/U in 1 Month

## 2023-11-10 ENCOUNTER — Encounter: Payer: Self-pay | Admitting: Hematology & Oncology

## 2023-11-10 ENCOUNTER — Inpatient Hospital Stay: Payer: Medicare Other | Admitting: Hematology & Oncology

## 2023-11-10 ENCOUNTER — Inpatient Hospital Stay: Payer: Medicare Other | Attending: Hematology & Oncology

## 2023-11-10 ENCOUNTER — Ambulatory Visit: Payer: Self-pay | Admitting: Hematology & Oncology

## 2023-11-10 VITALS — BP 130/45 | HR 75 | Temp 98.5°F | Resp 20 | Ht 66.0 in | Wt 208.5 lb

## 2023-11-10 DIAGNOSIS — C50911 Malignant neoplasm of unspecified site of right female breast: Secondary | ICD-10-CM | POA: Diagnosis not present

## 2023-11-10 DIAGNOSIS — C186 Malignant neoplasm of descending colon: Secondary | ICD-10-CM

## 2023-11-10 DIAGNOSIS — Z85038 Personal history of other malignant neoplasm of large intestine: Secondary | ICD-10-CM | POA: Diagnosis not present

## 2023-11-10 DIAGNOSIS — Z853 Personal history of malignant neoplasm of breast: Secondary | ICD-10-CM | POA: Diagnosis not present

## 2023-11-10 DIAGNOSIS — Z170 Estrogen receptor positive status [ER+]: Secondary | ICD-10-CM

## 2023-11-10 DIAGNOSIS — M810 Age-related osteoporosis without current pathological fracture: Secondary | ICD-10-CM | POA: Insufficient documentation

## 2023-11-10 DIAGNOSIS — D696 Thrombocytopenia, unspecified: Secondary | ICD-10-CM | POA: Diagnosis not present

## 2023-11-10 LAB — CBC WITH DIFFERENTIAL (CANCER CENTER ONLY)
Abs Immature Granulocytes: 0.01 10*3/uL (ref 0.00–0.07)
Basophils Absolute: 0 10*3/uL (ref 0.0–0.1)
Basophils Relative: 0 %
Eosinophils Absolute: 0.3 10*3/uL (ref 0.0–0.5)
Eosinophils Relative: 5 %
HCT: 38.5 % (ref 36.0–46.0)
Hemoglobin: 12.4 g/dL (ref 12.0–15.0)
Immature Granulocytes: 0 %
Lymphocytes Relative: 39 %
Lymphs Abs: 2.2 10*3/uL (ref 0.7–4.0)
MCH: 29.2 pg (ref 26.0–34.0)
MCHC: 32.2 g/dL (ref 30.0–36.0)
MCV: 90.6 fL (ref 80.0–100.0)
Monocytes Absolute: 0.4 10*3/uL (ref 0.1–1.0)
Monocytes Relative: 8 %
Neutro Abs: 2.7 10*3/uL (ref 1.7–7.7)
Neutrophils Relative %: 48 %
Platelet Count: 120 10*3/uL — ABNORMAL LOW (ref 150–400)
RBC: 4.25 MIL/uL (ref 3.87–5.11)
RDW: 12.7 % (ref 11.5–15.5)
WBC Count: 5.5 10*3/uL (ref 4.0–10.5)
nRBC: 0 % (ref 0.0–0.2)

## 2023-11-10 LAB — CMP (CANCER CENTER ONLY)
ALT: 25 U/L (ref 0–44)
AST: 30 U/L (ref 15–41)
Albumin: 4.2 g/dL (ref 3.5–5.0)
Alkaline Phosphatase: 133 U/L — ABNORMAL HIGH (ref 38–126)
Anion gap: 6 (ref 5–15)
BUN: 18 mg/dL (ref 8–23)
CO2: 30 mmol/L (ref 22–32)
Calcium: 9.8 mg/dL (ref 8.9–10.3)
Chloride: 105 mmol/L (ref 98–111)
Creatinine: 0.78 mg/dL (ref 0.44–1.00)
GFR, Estimated: >60 mL/min (ref >60)
Glucose, Bld: 99 mg/dL (ref 70–99)
Potassium: 4.6 mmol/L (ref 3.5–5.1)
Sodium: 141 mmol/L (ref 135–145)
Total Bilirubin: 0.6 mg/dL (ref 0.0–1.2)
Total Protein: 7.5 g/dL (ref 6.5–8.1)

## 2023-11-10 LAB — CEA (ACCESS): CEA (CHCC): 2.87 ng/mL (ref 0.00–5.00)

## 2023-11-10 NOTE — Progress Notes (Signed)
 Hematology and Oncology Follow Up Visit  Crystal Lee [id number] 1933/11/06 89 y.o. 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 Yearly -- next dose on 01/2024    Interim History:  Crystal Lee is here today for follow-up.  The primary now of her is that she is having dentures put in.  She had teeth removed.  She had all of her teeth extracted about 10 days ago.  She has done well with this.  She had no problems with the extractions.  Otherwise, she seems to be managing okay.  She has had no issues with nausea or vomiting.  There is been no cough or shortness of breath.  She has had no change in bowel or bladder habits.  She has had no problems with urinary tract infections.  Of note, her last CEA level back in January was 3.  She has had no fever.  She has had no problems with Pneumonia or COVID.Crystal Lee  She has had no leg swelling.  Overall, I would have said that her performance status is about ECOG 2.    Wt Readings from Last 3 Encounters:  11/10/23 208 lb 8 oz (94.6 kg)  10/27/23 208 lb 6.4 oz (94.5 kg)  09/26/23 208 lb (94.3 kg)    Medications:  Allergies as of 11/10/2023       Reactions   Morphine  Nausea And Vomiting   Propoxyphene Nausea And Vomiting, Palpitations, Other (See Comments)   Darvocet Causes Sweats   Donepezil  Diarrhea, Other (See Comments)   Tape Rash, Other (See Comments)        Medication List        Accurate as of November 10, 2023 12:27 PM. If you have any questions, ask your nurse or doctor.          aspirin  EC 81 MG tablet Take 81 mg by mouth daily. Swallow whole.    atorvastatin  20 MG tablet Commonly known as: LIPITOR Take 1 tablet (20 mg total) by mouth daily.   chlorhexidine  0.12 % solution Commonly known as: PERIDEX  Use as directed 15 mLs in the mouth or throat 2 (two) times daily.   cyanocobalamin  1000 MCG tablet Commonly known as: VITAMIN B12 Take 1,000 mcg by mouth daily.   estradiol  0.1 MG/GM vaginal cream Commonly known as: ESTRACE  Place 0.5 g vaginally 2 (two) times a week. Place 0.5g nightly for two weeks then twice a week after   HYDROcodone -acetaminophen  10-325 MG tablet Commonly known as: NORCO Take 1 tablet by mouth every 6 (six) hours as needed.   hydrocortisone  2.5 % ointment Apply topically 2 (two) times daily as needed.   ipratropium 0.06 % nasal spray Commonly known as: ATROVENT  Place 2 sprays into both nostrils 3 (three) times daily.   ketoconazole  2 % cream Commonly known as: NIZORAL  Apply topically 2 (two) times daily as needed.   losartan  25 MG tablet Commonly known as: COZAAR  Take 1 tablet (25 mg total) by mouth daily.   memantine  5 MG tablet Commonly known as: NAMENDA  Take 1 tablet (5 mg total) by mouth 2 (two) times daily.   metoprolol  tartrate 25 MG  tablet Commonly known as: LOPRESSOR  Take 12.5 mg (half tablet) in the morning as needed for palpitations   mirabegron  ER 50 MG Tb24 tablet Commonly known as: MYRBETRIQ  Take 1 tablet (50 mg total) by mouth daily.   Multivitamin Adults Tabs Take 1 tablet by mouth daily.   ondansetron  4 MG disintegrating tablet Commonly known as: ZOFRAN -ODT Take 1 tablet (4 mg total) by mouth every 8 (eight) hours as needed for nausea or vomiting.        Allergies:  Allergies  Allergen Reactions   Morphine  Nausea And Vomiting   Propoxyphene Nausea And Vomiting, Palpitations and Other (See Comments)    Darvocet Causes Sweats   Donepezil  Diarrhea and Other (See Comments)   Tape Rash and Other (See Comments)    Past Medical History, Surgical history, Social  history, and Family History were reviewed and updated.  Review of Systems: Review of Systems  Constitutional:  Positive for chills, fever and malaise/fatigue.  HENT: Negative.    Eyes: Negative.   Respiratory: Negative.    Cardiovascular: Negative.   Gastrointestinal: Negative.   Genitourinary:  Positive for dysuria, hematuria and urgency.  Musculoskeletal: Negative.   Skin: Negative.   Neurological: Negative.   Endo/Heme/Allergies: Negative.   Psychiatric/Behavioral: Negative.       Physical Exam:  height is 5' 6 (1.676 m) and weight is 208 lb 8 oz (94.6 kg). Her oral temperature is 98.5 F (36.9 C). Her blood pressure is 130/45 (abnormal) and her pulse is 75. Her respiration is 20 and oxygen saturation is 98%.   Wt Readings from Last 3 Encounters:  11/10/23 208 lb 8 oz (94.6 kg)  10/27/23 208 lb 6.4 oz (94.5 kg)  09/26/23 208 lb (94.3 kg)   Physical Exam Vitals reviewed.  Constitutional:      Comments: Breast exam shows bilateral mastectomies.  She has good mastectomy scars.  There is no erythema or warmth.  There is no nodularity on the chest wall.  There is no bilateral axillary adenopathy.  HENT:     Head: Normocephalic and atraumatic.   Eyes:     Pupils: Pupils are equal, round, and reactive to light.    Cardiovascular:     Rate and Rhythm: Normal rate and regular rhythm.     Heart sounds: Normal heart sounds.  Pulmonary:     Effort: Pulmonary effort is normal.     Breath sounds: Normal breath sounds.  Abdominal:     General: Bowel sounds are normal.     Palpations: Abdomen is soft.     Comments: Abdominal exam is soft.  She has decent bowel sounds.  She has well-healed laparoscopy scars.  There is no fluid wave.  There is no palpable liver or spleen tip.   Musculoskeletal:        General: No tenderness or deformity. Normal range of motion.     Cervical back: Normal range of motion.  Lymphadenopathy:     Cervical: No cervical adenopathy.   Skin:     General: Skin is warm and dry.     Findings: No erythema or rash.   Neurological:     Mental Status: She is alert and oriented to person, place, and time.   Psychiatric:        Behavior: Behavior normal.        Thought Content: Thought content normal.        Judgment: Judgment normal.      Lab Results  Component Value Date   WBC 5.5  11/10/2023   HGB 12.4 11/10/2023   HCT 38.5 11/10/2023   MCV 90.6 11/10/2023   PLT 120 (L) 11/10/2023   Lab Results  Component Value Date   FERRITIN 26 06/23/2023   IRON 76 06/23/2023   TIBC 398 06/23/2023   UIBC 322 06/23/2023   IRONPCTSAT 19 06/23/2023   Lab Results  Component Value Date   RETICCTPCT 1.4 06/23/2023   RBC 4.25 11/10/2023   No results found for: Ansel Bass, [facility] Lab Results  Component Value Date   IGGSERUM 1,085 02/09/2017   IGMSERUM 336 (H) 02/09/2017   No results found for: Hobson Luna, [facility]   Chemistry      Component Value Date/Time   NA 141 11/10/2023 1025   NA 139 02/25/2020 1627   K 4.6 11/10/2023 1025   CL 105 11/10/2023 1025   CO2 30 11/10/2023 1025   BUN 18 11/10/2023 1025   BUN 11 02/25/2020 1627   CREATININE 0.78 11/10/2023 1025      Component Value Date/Time   CALCIUM  9.8 11/10/2023 1025   ALKPHOS 133 (H) 11/10/2023 1025   AST 30 11/10/2023 1025   ALT 25 11/10/2023 1025   BILITOT 0.6 11/10/2023 1025      Impression and Plan: Ms. Cuello is a very pleasant 89 yo caucasian female with history of bilateral stage Ia breast cancer diagnosed back in 1998.   She also has had locally advanced colon cancer stage IIIb with 3+ lymph nodes.  She completed all of her adjuvant therapy, with Xeloda , in July 2022.  From my point of view, I do not see a problem with her having the teeth extracted and have the dentures.  I know this is a process.  For right now, I do not see any problems with any kind of  malignancy with her breast cancer or with her colon cancer.  We will go ahead and plan to get her back to see us  in another 3 months.  At that time, we will see about maybe Zometa .   Ivor Mars, MD 6/12/202512:27 PM

## 2023-11-11 ENCOUNTER — Encounter: Payer: Self-pay | Admitting: Hematology & Oncology

## 2023-11-11 DIAGNOSIS — M25551 Pain in right hip: Secondary | ICD-10-CM | POA: Diagnosis not present

## 2023-11-11 DIAGNOSIS — M47816 Spondylosis without myelopathy or radiculopathy, lumbar region: Secondary | ICD-10-CM | POA: Diagnosis not present

## 2023-11-11 DIAGNOSIS — S32020A Wedge compression fracture of second lumbar vertebra, initial encounter for closed fracture: Secondary | ICD-10-CM | POA: Diagnosis not present

## 2023-11-11 NOTE — Telephone Encounter (Signed)
-----   Message from Ivor Mars sent at 11/10/2023  2:31 PM EDT ----- Please call and let her know that the CEA tumor marker is normal at 2.87.  Twilla Galea ----- Message ----- From: Dannis Dy, [facility] [place] Sent: 11/10/2023  10:46 AM EDT To: Ivor Mars, MD

## 2023-11-11 NOTE — Telephone Encounter (Signed)
 Patient notified per order of Dr. Maria Shiner that the CEA tumor marker is normal at 2.87.  Pt is appreciative of call and has no questions or concerns at this time.

## 2023-11-28 DIAGNOSIS — H00014 Hordeolum externum left upper eyelid: Secondary | ICD-10-CM | POA: Diagnosis not present

## 2023-11-28 DIAGNOSIS — H0100B Unspecified blepharitis left eye, upper and lower eyelids: Secondary | ICD-10-CM | POA: Diagnosis not present

## 2023-12-05 ENCOUNTER — Ambulatory Visit (INDEPENDENT_AMBULATORY_CARE_PROVIDER_SITE_OTHER): Admitting: Physician Assistant

## 2023-12-05 VITALS — BP 125/73 | HR 68 | Wt 208.4 lb

## 2023-12-05 DIAGNOSIS — F03A0 Unspecified dementia, mild, without behavioral disturbance, psychotic disturbance, mood disturbance, and anxiety: Secondary | ICD-10-CM

## 2023-12-05 MED ORDER — MEMANTINE HCL 10 MG PO TABS: 10.0000 mg | ORAL_TABLET | Freq: Every evening | ORAL | 3 refills | Status: AC

## 2023-12-05 NOTE — Patient Instructions (Signed)
 It was a pleasure to see you today at our office.   Recommendations:   6 months  Continue memantine  10 mg at night     RECOMMENDATIONS FOR ALL PATIENTS WITH MEMORY PROBLEMS: 1. Continue to exercise (Recommend 30 minutes of walking everyday, or 3 hours every week) 2. Increase social interactions - continue going to [place] and enjoy social gatherings with friends and family 3. Eat healthy, avoid fried foods and eat more fruits and vegetables 4. Maintain adequate blood pressure, blood sugar, and blood cholesterol level. Reducing the risk of stroke and cardiovascular disease also helps promoting better memory. 5. Avoid stressful situations. Live a simple life and avoid aggravations. Organize your time and prepare for the next day in anticipation. 6. Sleep well, avoid any interruptions of sleep and avoid any distractions in the bedroom that may interfere with adequate sleep quality 7. Avoid sugar, avoid sweets as there is a strong link between excessive sugar intake, diabetes, and cognitive impairment We discussed the Mediterranean diet, which has been shown to help patients reduce the risk of progressive memory disorders and reduces cardiovascular risk. This includes eating fish, eat fruits and green leafy vegetables, nuts like almonds and hazelnuts, walnuts, and also use olive oil. Avoid fast foods and fried foods as much as possible. Avoid sweets and sugar as sugar use has been linked to worsening of memory function.  There is always a concern of gradual progression of memory problems. If this is the case, then we may need to adjust level of care according to patient needs. Support, both to the patient and caregiver, should then be put into place.    The [employer] is here all day, every day for people facing Alzheimer's disease through our free 24/7 Helpline: [phone]. The Helpline provides reliable information and support to all those who need assistance, such as individuals  living with memory loss, Alzheimer's or other dementia, caregivers, health care professionals and the public.  Our highly trained and knowledgeable staff can help you with: Understanding memory loss, dementia and Alzheimer's  Medications and other treatment options  General information about aging and brain health  Skills to provide quality care and to find the best care from professionals  Legal, financial and living-arrangement decisions Our Helpline also features: Confidential care consultation provided by master's level clinicians who can help with decision-making support, crisis assistance and education on issues families face every day  Help in a caller's preferred language using our translation service that features more than 200 languages and dialects  Referrals to local community programs, services and ongoing support     FALL PRECAUTIONS: Be cautious when walking. Scan the area for obstacles that may increase the risk of trips and falls. When getting up in the mornings, sit up at the edge of the bed for a few minutes before getting out of bed. Consider elevating the bed at the head end to avoid drop of blood pressure when getting up. Walk always in a well-lit room (use night lights in the walls). Avoid area rugs or power cords from appliances in the middle of the walkways. Use a walker or a cane if necessary and consider physical therapy for balance exercise. Get your eyesight checked regularly.  FINANCIAL OVERSIGHT: Supervision, especially oversight when making financial decisions or transactions is also recommended.  HOME SAFETY: Consider the safety of the kitchen when operating appliances like stoves, microwave oven, and blender. Consider having supervision and share cooking responsibilities until no longer able to participate in those.  Accidents with firearms and other hazards in the house should be identified and addressed as well.   ABILITY TO BE LEFT ALONE: If patient is unable  to contact 911 operator, consider using LifeLine, or when the need is there, arrange for someone to stay with patients. Smoking is a fire hazard, consider supervision or cessation. Risk of wandering should be assessed by caregiver and if detected at any point, supervision and safe proof recommendations should be instituted.  MEDICATION SUPERVISION: Inability to self-administer medication needs to be constantly addressed. Implement a mechanism to ensure safe administration of the medications.   DRIVING: Regarding driving, in patients with progressive memory problems, driving will be impaired. We advise to have someone else do the driving if trouble finding directions or if minor accidents are reported. Independent driving assessment is available to determine safety of driving.   If you are interested in the driving assessment, you can contact the following:  [employer] in [place] [phone]  [facility] [phone]  [facility] [phone] or [phone]      Mediterranean Diet A Mediterranean diet refers to food and lifestyle choices that are based on the traditions of countries located on the [employer]. This way of eating has been shown to help prevent certain conditions and improve outcomes for people who have chronic diseases, like kidney disease and heart disease. What are tips for following this plan? Lifestyle  Cook and eat meals together with your family, when possible. Drink enough fluid to keep your urine clear or pale yellow. Be physically active every day. This includes: Aerobic exercise like running or swimming. Leisure activities like gardening, walking, or housework. Get 7-8 hours of sleep each night. If recommended by your health care provider, drink red wine in moderation. This means 1 glass a day for nonpregnant women and 2 glasses a day for men. A glass of wine equals 5 oz (150  mL). Reading food labels  Check the serving size of packaged foods. For foods such as rice and pasta, the serving size refers to the amount of cooked product, not dry. Check the total fat in packaged foods. Avoid foods that have saturated fat or trans fats. Check the ingredients list for added sugars, such as corn syrup. Shopping  At the grocery store, buy most of your food from the areas near the walls of the store. This includes: Fresh fruits and vegetables (produce). Grains, beans, nuts, and seeds. Some of these may be available in unpackaged forms or large amounts (in bulk). Fresh seafood. Poultry and eggs. Low-fat dairy products. Buy whole ingredients instead of prepackaged foods. Buy fresh fruits and vegetables in-season from local farmers markets. Buy frozen fruits and vegetables in resealable bags. If you do not have access to quality fresh seafood, buy precooked frozen shrimp or canned fish, such as tuna, salmon, or sardines. Buy small amounts of raw or cooked vegetables, salads, or olives from the deli or salad bar at your store. Stock your pantry so you always have certain foods on hand, such as olive oil, canned tuna, canned tomatoes, rice, pasta, and beans. Cooking  Cook foods with extra-virgin olive oil instead of using butter or other vegetable oils. Have meat as a side dish, and have vegetables or grains as your main dish. This means having meat in small portions or adding small amounts of meat to foods like pasta or stew. Use beans or vegetables instead of meat in common dishes like chili or  lasagna. Experiment with different cooking methods. Try roasting or broiling vegetables instead of steaming or sauteing them. Add frozen vegetables to soups, stews, pasta, or rice. Add nuts or seeds for added healthy fat at each meal. You can add these to yogurt, salads, or vegetable dishes. Marinate fish or vegetables using olive oil, lemon juice, garlic, and fresh herbs. Meal  planning  Plan to eat 1 vegetarian meal one day each week. Try to work up to 2 vegetarian meals, if possible. Eat seafood 2 or more times a week. Have healthy snacks readily available, such as: Vegetable sticks with hummus. Greek yogurt. Fruit and nut trail mix. Eat balanced meals throughout the week. This includes: Fruit: 2-3 servings a day Vegetables: 4-5 servings a day Low-fat dairy: 2 servings a day Fish, poultry, or lean meat: 1 serving a day Beans and legumes: 2 or more servings a week Nuts and seeds: 1-2 servings a day Whole grains: 6-8 servings a day Extra-virgin olive oil: 3-4 servings a day Limit red meat and sweets to only a few servings a month What are my food choices? Mediterranean diet Recommended Grains: Whole-grain pasta. Brown rice. Bulgar wheat. Polenta. Couscous. Whole-wheat bread. [place] Madeira. Vegetables: Artichokes. Beets. Broccoli. Cabbage. Carrots. Eggplant. Green beans. Chard. Kale. Spinach. Onions. Leeks. Peas. Squash. Tomatoes. Peppers. Radishes. Fruits: Apples. Apricots. Avocado. Berries. Bananas. Cherries. Dates. Figs. Grapes. Lemons. Melon. Oranges. Peaches. Plums. Pomegranate. Meats and other protein foods: Beans. Almonds. Sunflower seeds. Pine nuts. Peanuts. Cod. Salmon. Scallops. Shrimp. Tuna. Tilapia. Clams. Oysters. Eggs. Dairy: Low-fat milk. Cheese. Greek yogurt. Beverages: Water . Red wine. Herbal tea. Fats and oils: Extra virgin olive oil. Avocado oil. Grape seed oil. Sweets and desserts: [place] yogurt with honey. Baked apples. Poached pears. Trail mix. Seasoning and other foods: Basil. Cilantro. Coriander. Cumin. Mint. Parsley. Sage. Rosemary. Tarragon. Garlic. Oregano. Thyme. Pepper. Balsalmic vinegar. Tahini. Hummus. Tomato sauce. Olives. Mushrooms. Limit these Grains: Prepackaged pasta or rice dishes. Prepackaged cereal with added sugar. Vegetables: Deep fried potatoes (french fries). Fruits: Fruit canned in syrup. Meats and other protein  foods: Beef. Pork. Lamb. Poultry with skin. Hot dogs. [place]. Dairy: Ice cream. Sour cream. Whole milk. Beverages: Juice. Sugar-sweetened soft drinks. Beer. Liquor and spirits. Fats and oils: Butter. Canola oil. Vegetable oil. Beef fat (tallow). Lard. Sweets and desserts: Cookies. Cakes. Pies. Candy. Seasoning and other foods: Mayonnaise. Premade sauces and marinades. The items listed may not be a complete list. Talk with your dietitian about what dietary choices are right for you. Summary The Mediterranean diet includes both food and lifestyle choices. Eat a variety of fresh fruits and vegetables, beans, nuts, seeds, and whole grains. Limit the amount of red meat and sweets that you eat. Talk with your health care provider about whether it is safe for you to drink red wine in moderation. This means 1 glass a day for nonpregnant women and 2 glasses a day for men. A glass of wine equals 5 oz (150 mL). This information is not intended to replace advice given to you by your health care provider. Make sure you discuss any questions you have with your health care provider. Document Released: 01/08/2016 Document Revised: 02/10/2016 Document Reviewed: 01/08/2016 Elsevier Interactive Patient Education  2017 [employer].  Your provider has requested that you have labwork completed today. Please go to [facility] ([id number]) on the second floor of this building before leaving the office today. You do not need to check in. If you are not called within 15 minutes please check with  the front desk.

## 2023-12-05 NOTE — Progress Notes (Signed)
 Assessment/Plan:   Dementia likely due to Alzheimer disease, mild, without behavioral disturbance    Crystal Lee is a very pleasant 89 y.o. RH female with a history of hypertension, hyperlipidemia, sleep apnea, iron deficiency anemia, stage IIIb colon cancer recently off Xeloda , on Zometa  yearly, history of stage Ia breast cancer, anxiety, depression, and mild dementia likely due to  Alzheimer's disease seen today in follow up for memory loss. She has high anxiety with testing, unable to perform MMSE. Patient is currently on memantine  10 mg nightly by choice (GI side effects with donepezil ) .  Patient is able to participate on ADLs to her ability and to drive without significant difficulties.  Mood is stable.     Follow up in 6  months. Continue memantine  10 mg nightly by choice, side effects discussed.  Recommend good control of her cardiovascular risk factors Continue to control mood as per PCP     Subjective:    This patient is here alone.  Previous records as well as any outside records available were reviewed prior to todays visit. Patient was last seen on 07/14/2023.    Any changes in memory since last visit? About the same.  She enjoys doing crossword puzzles, memorizing capitals  of the world, reading and watching documentaries, marine life.  Long-term memory is good.  She is able to participate in her ADLs without any issues. repeats oneself?  Endorsed Disoriented when walking into a room? Denies   Leaving objects?  May misplace things but not in unusual places   Wandering behavior?  denies   Any personality changes since last visit?  Denies.   Any worsening depression?:  Denies.   Hallucinations or paranoia?  Denies.   Seizures? denies    Any sleep changes?  Does not sleep very well.  Takes melatonin when needed.  Patient denies vivid dreams, REM behavior or sleepwalking   Sleep apnea?   Denies.   Any hygiene concerns? Denies.  Independent of bathing and dressing?   Endorsed  Does the patient needs help with medications?  Patient is in charge, denies forgetting them. Who is in charge of the finances?  Patient is in charge, except for taxes (her daughter does them)     Any changes in appetite?  denies.  She favors sweets.    Patient have trouble swallowing? Denies.   Does the patient cook? Yes, denies forgetting her own recipes. Any headaches?  Occasionally, parietal since a window fell on my head years ago. Any vision changes?  . My L eye gets infected and need doxycycline  drops since Covid shot 5 years ago. Chronic back pain  endorsed, see below Ambulates with difficulty?  She has chronic bilateral lower extremity paresthesias and left radiculitis, L2 and 3 compression fracture as well as multiple areas of arthritis and spasms, with chronic pain syndrome, several agents followed by PCP.  She declines PT.  She is on rest and heat therapy.  Uses a walker for long distances, otherwise a cane.  Recent falls or head injuries? Denies.     Unilateral weakness, numbness or tingling? denies   Any tremors?  Has chronic resting tremors seen in [place] many decades ago.  Any anosmia?  Denies   Any incontinence of urine?  Endorsed, OAB, received  PTNS (percutaneous tibial nerve stimulation) x2 sessions, helped for a little while but did not last too long. Wears Depends she had a recent UTI on May 2025. Any bowel dysfunction?  Constipation, tries to avoid chocolate.  Patient lives alone  Does the patient drive? Yes, denies any issues.       Initial visit 05/05/2021 the patient is seen in neurologic consultation at the request of Copland, Harlene BROCKS, MD for the evaluation of memory.  The patient is here alone.  This is a 89 y.o. year old RH  female who presents today after hospitalization for strokelike symptoms.  It was also noted that she may have had cognitive issues during the presentation, at which time a MoCA was performed, yielding a result of 18/30.  Of note,  the patient denies having any cognitive issues, treated in some of short-term memory difficulties to old age .  During the hospitalization, she was diagnosed with small vessel disease, evidence of amyloid angiopathy per MRI, central nervous system degenerative disorder, and mild to moderate Alzheimer's disease per review of the notes.  She was to follow-up at [facility], but she wanted to seek additional neurologic opinion with [facility] neurology instead. For her memory, she denies repeating herself, or being disoriented when walking into the room.  She denies leaving objects in unusual places.  She ambulates with her walker for safety.  She had prior falls, fracturing her pelvis, and living with pain over the last 89 years old older .  She only had 1 minor head injury about 17 years ago while cleaning a window, and hitting her head while she was moving forward.  She did not lose any consciousness at that time.  She denies being disoriented when walking, or wandering behavior.  She drives any distances, without GPS I do not need it .  She stopped driving long distances however when she came from [place]  in 2018 to retire, and her vision was not as good.  Since her husband's death, she lives alone, and has home health visiting her.  Her mood is good, she denies depression or irritability.  She enjoys painting, and coloring.  She is to be able to do crossword puzzles but she stopped due to eye problems .  She sleeps well, denies vivid dreams or sleepwalking.  She denies hallucinations or paranoia.  There are no hygiene concerns.  She is independent of bathing and dressing.  Her medications are in a pillbox.  Her finances are done by her, only taxes are done by one of her children.  Her appetite is good, denies any trouble swallowing.  She cooks without forgetting any common recipes, or burning the stove.     As for her strokelike symptoms, the patient reports that she had similar episodes in the year 2000, when she  noticed mild facial drooping and numbness, which quickly resolved, without recurrence.  On the day of evaluation in October 2022, she was at her doctor's office, when she had 20 second  episode (she denies this was 20-minute episodes as in chart).  Her PCP told her to go downstairs today urgent care, to check that out .  From the urgent care, the transported her to the ER, for further evaluation, suspecting that the patient was having a TIA.  Per chart notes, she may have had possible right facial droop and right-sided weakness, although she states that for the last 20 years, she has slightly decreased strength on the right side of her body.  The ER neurologist saw her, and work-up was initiated.  MRI of the brain did not show acute CVA, but did show amyloid angiopathy.  EEG was without seizures.  2D echo was unremarkable.  Neurology recommended aspirin  81  mg on discharge, and no DAPT was going to be given, because of concern of amyloid angiopathy.  Lipitor 20 was added on discharge.  No further episodes of strokelike symptoms since then.  Of note, during that hospitalization, she also was found to have UTI I know I had it, I have regular UTIs.  EKG shows sinus tachycardia.  COVID was negative.  Neurology recommended that she had a better control of her blood pressure to lays down on 140 systolic.  No stroke was diagnosed.  She denies today any headaches, double vision, dizziness, focal numbness or tingling, unilateral weakness other than the chronic mild right upper extremity as mentioned above, tremors or anosmia.  No history of seizures.  She denies urine incontinence, but occasionally wears a pad when going out.  She denies any retention.  She has a long history of constipation.  She denies any diarrhea.  She denies sleep apnea, although in chart it shows in certain areas that she may have it.  She denies any alcohol or tobacco.  Family history negative for dementia.  The patient is originally from [place].      MRI brain  1. No evidence of acute intracranial abnormality, including infarct. 2. Numerous tiny scattered foci of susceptibility artifact throughout the cortex bilaterally, greatest in the parieto-occipital lobes. While nonspecific, findings could relate to prior microhemorrhages from amyloid angiopathy or prior microemboli. The distribution is atypical for hypertensive hemorrhages.     EMG/NCS 2019 bilateral lower extremity paresthesia, bilateral leg pain, EMG nerve conduction study then showed no large fiber peripheral neuropathy   PREVIOUS MEDICATIONS: Memantine  5 mg twice daily (nausea) (she is only on memantine  daily).  Donepezil  (GI side effects)  CURRENT MEDICATIONS:  Outpatient Encounter Medications as of 12/05/2023  Medication Sig   aspirin  EC 81 MG tablet Take 81 mg by mouth daily. Swallow whole.   atorvastatin  (LIPITOR) 20 MG tablet Take 1 tablet (20 mg total) by mouth daily.   chlorhexidine  (PERIDEX ) 0.12 % solution Use as directed 15 mLs in the mouth or throat 2 (two) times daily.   estradiol  (ESTRACE ) 0.1 MG/GM vaginal cream Place 0.5 g vaginally 2 (two) times a week. Place 0.5g nightly for two weeks then twice a week after   HYDROcodone -acetaminophen  (NORCO) 10-325 MG tablet Take 1 tablet by mouth every 6 (six) hours as needed.   hydrocortisone  2.5 % ointment Apply topically 2 (two) times daily as needed.   ipratropium (ATROVENT ) 0.06 % nasal spray Place 2 sprays into both nostrils 3 (three) times daily.   ketoconazole  (NIZORAL ) 2 % cream Apply topically 2 (two) times daily as needed.   losartan  (COZAAR ) 25 MG tablet Take 1 tablet (25 mg total) by mouth daily.   metoprolol  tartrate (LOPRESSOR ) 25 MG tablet Take 12.5 mg (half tablet) in the morning as needed for palpitations   mirabegron  ER (MYRBETRIQ ) 50 MG TB24 tablet Take 1 tablet (50 mg total) by mouth daily.   Multiple Vitamins-Minerals (MULTIVITAMIN ADULTS) TABS Take 1 tablet by mouth daily.   ondansetron  (ZOFRAN -ODT)  4 MG disintegrating tablet Take 1 tablet (4 mg total) by mouth every 8 (eight) hours as needed for nausea or vomiting.   vitamin B-12 (CYANOCOBALAMIN ) 1000 MCG tablet Take 1,000 mcg by mouth daily.   [DISCONTINUED] memantine  (NAMENDA ) 5 MG tablet Take 1 tablet (5 mg total) by mouth 2 (two) times daily.   memantine  (NAMENDA ) 10 MG tablet Take 1 tablet (10 mg total) by mouth at bedtime.   Facility-Administered Encounter Medications as  of 12/05/2023  Medication   lidocaine  (XYLOCAINE ) 2 % jelly 1 Application       08/31/2022    2:13 PM  MMSE - Mini Mental State Exam  Not completed: Unable to complete      05/05/2021   12:00 PM  Montreal Cognitive Assessment   Visuospatial/ Executive (0/5) 2  Naming (0/3) 3  Attention: Read list of digits (0/2) 2  Attention: Read list of letters (0/1) 1  Attention: Serial 7 subtraction starting at 100 (0/3) 1  Language: Repeat phrase (0/2) 1  Language : Fluency (0/1) 0  Abstraction (0/2) 2  Delayed Recall (0/5) 0  Orientation (0/6) 6  Total 18    Objective:     PHYSICAL EXAMINATION:    VITALS:   Vitals:   12/05/23 1123  BP: 125/73  Pulse: 68  SpO2: 94%  Weight: 208 lb 6.4 oz (94.5 kg)    GEN:  The patient appears stated age and is in NAD. HEENT:  Normocephalic, atraumatic.   Neurological examination:  General: NAD, well-groomed, appears stated age. Orientation: The patient is alert. Oriented to person, place and date Abnormal movements she has chronic very mild resting tremors, very mild intention and postural tremors, unchanged from prior visit.  No cogwheeling.  No myoclonus.  No asterixis. Cranial nerves: There is good facial symmetry.anxious appearing.  The speech is fluent and clear. No aphasia or dysarthria. Fund of knowledge is appropriate. Recent and remote memory are impaired. Attention and concentration are reduced. Able to name objects and repeat phrases.  Hearing is intact to conversational tone.   Sensation: Sensation is  intact to light touch throughout Motor: Strength is at least antigravity x4. DTR's 14 in UE/LE     Movement examination: Tone: There is normal tone in the UE/LE Abnormal movements:  no tremor.  No myoclonus.  No asterixis.   Coordination:  There is no decremation with RAM's. Normal finger to nose  Gait and Station: The patient has some difficulty arising out of a deep-seated chair without the use of the hands. The patient's stride length is good. Flexes forward. Gait is cautious and narrow.  Uses a walker for stability.   Thank you for allowing us  the opportunity to participate in the care of this nice patient. Please do not hesitate to contact us  for any questions or concerns.   Total time spent on today's visit was 24 minutes dedicated to this patient today, preparing to see patient, examining the patient, ordering tests and/or medications and counseling the patient, documenting clinical information in the EHR or other health record, independently interpreting results and communicating results to the patient/family, discussing treatment and goals, answering patient's questions and coordinating care.  Cc:  Copland, Harlene BROCKS, MD  Camie Sevin 12/05/2023 12:28 PM

## 2023-12-12 DIAGNOSIS — M47816 Spondylosis without myelopathy or radiculopathy, lumbar region: Secondary | ICD-10-CM | POA: Diagnosis not present

## 2023-12-22 ENCOUNTER — Encounter: Attending: Physical Medicine & Rehabilitation | Admitting: Registered Nurse

## 2023-12-22 ENCOUNTER — Encounter: Payer: Self-pay | Admitting: Registered Nurse

## 2023-12-22 VITALS — BP 125/71 | HR 78 | Ht 66.0 in | Wt 207.8 lb

## 2023-12-22 DIAGNOSIS — Z79891 Long term (current) use of opiate analgesic: Secondary | ICD-10-CM | POA: Insufficient documentation

## 2023-12-22 DIAGNOSIS — R202 Paresthesia of skin: Secondary | ICD-10-CM | POA: Diagnosis not present

## 2023-12-22 DIAGNOSIS — G894 Chronic pain syndrome: Secondary | ICD-10-CM | POA: Insufficient documentation

## 2023-12-22 DIAGNOSIS — M545 Low back pain, unspecified: Secondary | ICD-10-CM | POA: Diagnosis not present

## 2023-12-22 DIAGNOSIS — M546 Pain in thoracic spine: Secondary | ICD-10-CM | POA: Insufficient documentation

## 2023-12-22 DIAGNOSIS — G8929 Other chronic pain: Secondary | ICD-10-CM | POA: Insufficient documentation

## 2023-12-22 DIAGNOSIS — Z5181 Encounter for therapeutic drug level monitoring: Secondary | ICD-10-CM | POA: Diagnosis not present

## 2023-12-22 DIAGNOSIS — M2550 Pain in unspecified joint: Secondary | ICD-10-CM | POA: Diagnosis not present

## 2023-12-22 DIAGNOSIS — M170 Bilateral primary osteoarthritis of knee: Secondary | ICD-10-CM | POA: Insufficient documentation

## 2023-12-22 MED ORDER — HYDROCODONE-ACETAMINOPHEN 10-325 MG PO TABS: 1.0000 | ORAL_TABLET | Freq: Four times a day (QID) | ORAL | 0 refills | Status: DC | PRN

## 2023-12-22 NOTE — Progress Notes (Signed)
 Subjective:    Patient ID: Crystal Lee, female    DOB: [birth year], 89 y.o.   MRN: [id number]  HPI: Crystal Lee is a 89 y.o. female who returns for follow up appointment for chronic pain and medication refill. She states her pain is located in her mid- lower back, bilateral lower extremities and bilateral feet with tingling and numbness. She rates her pain 4. Her current exercise regime is walking short distances with her walker  Crystal Lee Morphine  equivalent is 40.00 MME.   Oral Swab was Performed Today.     Pain Inventory Average Pain 4 Pain Right Now 4 My pain is sharp, stabbing, tingling, and aching  In the last 24 hours, has pain interfered with the following? General activity 4 Relation with others 4 Enjoyment of life 4 What TIME of day is your pain at its worst? evening Sleep (in general) Fair  Pain is worse with: walking, bending, standing, and some activites Pain improves with: rest, heat/ice, and medication Relief from Meds: 3  Family History  Problem Relation Age of Onset   Other Mother        complications from flu   Other Father        unsure of cause   Colon cancer Neg Hx    Social History   Socioeconomic History   Marital status: Widowed    Spouse name: Not on file   Number of children: 3   Years of education: 16 years   Highest education level: Not on file  Occupational History   Occupation: Retired  Tobacco Use   Smoking status: Former    Current packs/day: 0.00    Average packs/day: 0.5 packs/day for 20.0 years (10.0 ttl pk-yrs)    Types: Cigarettes    Start date: 02/03/1956    Quit date: 02/03/1976    Years since quitting: 47.9   Smokeless tobacco: Never  Vaping Use   Vaping status: Never Used  Substance and Sexual Activity   Alcohol use: No   Drug use: No   Sexual activity: Not Currently    Birth control/protection: Post-menopausal  Other Topics Concern   Not on file  Social History Narrative   Lives at home with husband.    Right-handed.   No caffeine use.   retired   Teacher, early years/pre Strain: Low Risk  (09/02/2023)   Overall Financial Resource Strain (CARDIA)    Difficulty of Paying Living Expenses: Not hard at all  Food Insecurity: No Food Insecurity (09/02/2023)   Hunger Vital Sign    Worried About Running Out of Food in the Last Year: Never true    Ran Out of Food in the Last Year: Never true  Transportation Needs: No Transportation Needs (09/02/2023)   PRAPARE - Administrator, Civil Service (Medical): No    Lack of Transportation (Non-Medical): No  Physical Activity: Inactive (09/02/2023)   Exercise Vital Sign    Days of Exercise per Week: 0 days    Minutes of Exercise per Session: 0 min  Stress: No Stress Concern Present (09/02/2023)   [name] of Occupational Health - Occupational Stress Questionnaire    Feeling of Stress : Only a little  Social Connections: Moderately Isolated (09/02/2023)   Social Connection and Isolation Panel    Frequency of Communication with Friends and Family: Twice a week    Frequency of Social Gatherings with Friends and Family: Three times a week    Attends Religious Services: More  than 4 times per year    Active Member of Clubs or Organizations: No    Attends Banker Meetings: Never    Marital Status: Widowed   Past Surgical History:  Procedure Laterality Date   51 HOUR PH STUDY N/A 06/21/2018   Procedure: 24 HOUR PH STUDY;  Surgeon: San Sandor GAILS, DO;  Location: [facility];  Service: Gastroenterology;  Laterality: N/A;  with impedance   ABDOMINAL HYSTERECTOMY  2010   ANAL RECTAL MANOMETRY N/A 09/19/2019   Procedure: ANO RECTAL MANOMETRY;  Surgeon: Shila Gustav GAILS, MD;  Location: [facility];  Service: Endoscopy;  Laterality: N/A;   APPENDECTOMY  1949   BACK SURGERY  1973   BIOPSY  06/10/2020   Procedure: BIOPSY;  Surgeon: San Sandor GAILS, DO;  Location: [facility];  Service: Gastroenterology;;   EGD and COLON   BREAST SURGERY     CATARACT EXTRACTION Bilateral 2011   CHOLECYSTECTOMY     COLON RESECTION N/A 06/12/2020   Procedure: LAPAROSCOPIC COLON RESECTION;  Surgeon: Vernetta Berg, MD;  Location: [facility];  Service: General;  Laterality: N/A;   COLONOSCOPY WITH PROPOFOL  N/A 06/10/2020   Procedure: COLONOSCOPY WITH PROPOFOL ;  Surgeon: San Sandor GAILS, DO;  Location: [facility];  Service: Gastroenterology;  Laterality: N/A;   ESOPHAGEAL MANOMETRY N/A 06/21/2018   Procedure: ESOPHAGEAL MANOMETRY (EM);  Surgeon: San Sandor GAILS, DO;  [facility]: [facility];  Service: Gastroenterology;  Laterality: N/A;   ESOPHAGOGASTRODUODENOSCOPY (EGD) WITH PROPOFOL  N/A 06/10/2020   Procedure: ESOPHAGOGASTRODUODENOSCOPY (EGD) WITH PROPOFOL ;  Surgeon: San Sandor GAILS, DO;  [facility]: [facility];  Service: Gastroenterology;  Laterality: N/A;   GALLBLADDER SURGERY  2015   INCISIONAL HERNIA REPAIR N/A 09/16/2021   Procedure: OPEN INCISIONAL HERNIA REPAIR WITH MESH;  Surgeon: Vernetta Berg, MD;  Location: [facility];  Service: General;  Laterality: N/A;   JOINT REPLACEMENT     RT TOTAL HIP / RT TOTAL KNEE   MASTECTOMY  1998   BILATERAL    PH IMPEDANCE STUDY  06/21/2018   Procedure: PH IMPEDANCE STUDY;  Surgeon: San Sandor GAILS, DO;  Location: [facility];  Service: Gastroenterology;;   POLYPECTOMY  06/10/2020   Procedure: POLYPECTOMY;  Surgeon: San Sandor GAILS, DO;  [facility]: [facility];  Service: Gastroenterology;;   SKIN CANCER EXCISION  2016   RT SIDE OF NOSE   SUBMUCOSAL TATTOO INJECTION  06/10/2020   Procedure: SUBMUCOSAL TATTOO INJECTION;  Surgeon: San Sandor GAILS, DO;  [facility]: [facility];  Service: Gastroenterology;;   TONSILLECTOMY  1953   TOTAL HIP ARTHROPLASTY  2010   RIGHT   TOTAL HIP ARTHROPLASTY Left 05/09/2015   Procedure: LEFT TOTAL HIP ARTHROPLASTY ANTERIOR APPROACH;  Surgeon: Dempsey Moan, MD;  Location: [facility];  Service: Orthopedics;  Laterality: Left;   TOTAL  KNEE ARTHROPLASTY  2001   TUMOR REMOVAL  2012   ABDOMINAL - NON CANCEROUS   Past Surgical History:  Procedure Laterality Date   44 HOUR PH STUDY N/A 06/21/2018   Procedure: 24 HOUR PH STUDY;  Surgeon: San Sandor GAILS, DO;  [facility];  Service: Gastroenterology;  Laterality: N/A;  with impedance   ABDOMINAL HYSTERECTOMY  2010   ANAL RECTAL MANOMETRY N/A 09/19/2019   Procedure: ANO RECTAL MANOMETRY;  Surgeon: Shila Gustav GAILS, MD;  [facility]: [facility];  Service: Endoscopy;  Laterality: N/A;   APPENDECTOMY  1949   BACK SURGERY  1973   BIOPSY  06/10/2020   Procedure: BIOPSY;  Surgeon: San Sandor GAILS, DO;  [facility];  Service: Gastroenterology;;  EGD and COLON   BREAST SURGERY     CATARACT EXTRACTION Bilateral 2011   CHOLECYSTECTOMY     COLON RESECTION N/A 06/12/2020   Procedure: LAPAROSCOPIC COLON RESECTION;  Surgeon: Vernetta Berg, MD;  [facility];  Service: General;  Laterality: N/A;   COLONOSCOPY WITH PROPOFOL  N/A 06/10/2020   Procedure: COLONOSCOPY WITH PROPOFOL ;  Surgeon: San Sandor GAILS, DO;  [facility];  Service: Gastroenterology;  Laterality: N/A;   ESOPHAGEAL MANOMETRY N/A 06/21/2018   Procedure: ESOPHAGEAL MANOMETRY (EM);  Surgeon: San Sandor GAILS, DO;  [facility];  Service: Gastroenterology;  Laterality: N/A;   ESOPHAGOGASTRODUODENOSCOPY (EGD) WITH PROPOFOL  N/A 06/10/2020   Procedure: ESOPHAGOGASTRODUODENOSCOPY (EGD) WITH PROPOFOL ;  Surgeon: San Sandor GAILS, DO;  [facility];  Service: Gastroenterology;  Laterality: N/A;   GALLBLADDER SURGERY  2015   INCISIONAL HERNIA REPAIR N/A 09/16/2021   Procedure: OPEN INCISIONAL HERNIA REPAIR WITH MESH;  Surgeon: Vernetta Berg, MD;  [facility];  Service: General;  Laterality: N/A;   JOINT REPLACEMENT     RT TOTAL HIP / RT TOTAL KNEE   MASTECTOMY  1998   BILATERAL    PH IMPEDANCE STUDY  06/21/2018   Procedure: PH IMPEDANCE STUDY;  Surgeon:  San Sandor GAILS, DO;  [facility]: [facility];  Service: Gastroenterology;;   POLYPECTOMY  06/10/2020   Procedure: POLYPECTOMY;  Surgeon: San Sandor GAILS, DO;  [facility];  Service: Gastroenterology;;   SKIN CANCER EXCISION  2016   RT SIDE OF NOSE   SUBMUCOSAL TATTOO INJECTION  06/10/2020   Procedure: SUBMUCOSAL TATTOO INJECTION;  Surgeon: San Sandor GAILS, DO;  [facility];  Service: Gastroenterology;;   TONSILLECTOMY  1953   TOTAL HIP ARTHROPLASTY  2010   RIGHT   TOTAL HIP ARTHROPLASTY Left 05/09/2015   Procedure: LEFT TOTAL HIP ARTHROPLASTY ANTERIOR APPROACH;  Surgeon: Dempsey Moan, MD;  Location: [facility];  Service: Orthopedics;  Laterality: Left;   TOTAL KNEE ARTHROPLASTY  2001   TUMOR REMOVAL  2012   ABDOMINAL - NON CANCEROUS   Past Medical History:  Diagnosis Date   Acquired trigger finger of right little finger 01/15/2019   Acute blood loss anemia 06/08/2020   Acute lower GI bleeding 06/09/2020   Acute pain of right wrist 03/28/2019   Adenomatous polyp of sigmoid colon    Aftercare 08/17/2018   Arthritis    Cancer (HCC)    HX BREAST CANCER/ SKIN CANCER   Carpal tunnel syndrome of right wrist 01/15/2019   Colon cancer (HCC)    Colonic mass    Complication of anesthesia    N/V WITH MORPHINE    Constipation    Difficulty sleeping    Diverticulosis    Diverticulosis of colon without hemorrhage    Dyslipidemia 02/02/2018   Encounter for orthopedic follow-up care 06/18/2019   Fracture of superior pubic ramus (HCC) 06/16/2018   Fractured hip (HCC)    LEFT - AUG 2016   Gait abnormality 02/09/2017   Gastric polyps    GERD (gastroesophageal reflux disease)    Heartburn    History of right knee joint replacement 10/17/2017   HTN (hypertension) 06/08/2020   Hyperlipidemia    Hypertension    Incisional hernia 09/16/2021   Low back pain 02/09/2017   Lower GI bleed 06/08/2020   Lumbar post-laminectomy syndrome 07/31/2018   Malignant neoplasm of  descending colon (HCC)    Melanoma (HCC)    Memory impairment 07/12/2022   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 behavioral disturbance, psychotic disturbance,  mood disturbance, or anxiety (HCC) 05/05/2021   Neuropathy    Nocturia    OA (osteoarthritis) of hip 05/09/2015   Osteopenia    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   Pain 05/05/2021   Pain in both lower extremities 08/02/2017   Pain in right hand 06/10/2017   Pain of left hand 04/20/2018   Paresthesia 02/09/2017   PONV (postoperative nausea and vomiting)    PT STATES MORPHINE  CAUSED N/V   Primary osteoarthritis of left knee 10/17/2017   Radial styloid tenosynovitis of left hand 05/16/2018   Rosacea    Sleep apnea    Stage 1 breast cancer, ER+, right (HCC) 07/04/2017   Stroke-like symptoms 03/25/2021   Tenosynovitis, wrist 03/28/2019   Trigger finger of right hand 06/10/2017   Trochanteric bursitis of left hip 12/23/2017   Urinary tract infection 04/20/2019   BP 125/71 (BP Location: Left Arm, Patient Position: Sitting, Cuff Size: Large)   Pulse 78   Ht 5' 6 (1.676 m)   Wt 207 lb 12.8 oz (94.3 kg)   SpO2 93%   BMI 33.54 kg/m   Opioid Risk Score:   Fall Risk Score:  `1  Depression screen [facility] 2/9     10/27/2023   11:54 AM 10/27/2023   11:52 AM 09/02/2023    1:16 PM 09/01/2023   11:55 AM 08/02/2023   11:33 AM 05/30/2023    1:26 PM 04/11/2023   11:04 AM  Depression screen PHQ 2/9  Decreased Interest 2 0 0 0 0 1 0  Down, Depressed, Hopeless 2 0 0 0 0 0 0  PHQ - 2 Score 4 0 0 0 0 1 0  Altered sleeping 2        Tired, decreased energy 2        Change in appetite 0        Feeling bad or failure about yourself  0        Trouble concentrating 0        Moving slowly or fidgety/restless 0        Suicidal thoughts 0        PHQ-9 Score 8        Difficult doing work/chores Somewhat difficult           Review of Systems  Musculoskeletal:  Positive for back pain and myalgias.       Back pain radiating down into  legs   All other systems reviewed and are negative.      Objective:   Physical Exam Vitals and nursing note reviewed.  Constitutional:      Appearance: Normal appearance.  Cardiovascular:     Rate and Rhythm: Normal rate and regular rhythm.     Pulses: Normal pulses.     Heart sounds: Normal heart sounds.  Pulmonary:     Effort: Pulmonary effort is normal.     Breath sounds: Normal breath sounds.  Musculoskeletal:     Comments: Normal Muscle Bulk and Muscle Testing Reveals:  Upper Extremities: Full ROM and Muscle Strength 5/5 Thoracic and Lumbar Hypersensitivity Lower Extremities: Full ROM and Muscle Strength 5/5 Left Lower Extremity Flexion Produces Pain into her left Patella Arises from Chair slowly using walker for support Narrow Based  Gait     Skin:    General: Skin is warm and dry.  Neurological:     Mental Status: She is alert and oriented to person, place, and time.  Psychiatric:        Mood and Affect:  Mood normal.        Behavior: Behavior normal.          Assessment & Plan:  1.Chronic Bilateral :Low Back Pain  Leg pain: Paresthesia: Continue HEP as Tolerated. Continue to Monitor. 12/22/2023 2. Paresthesia Harrie Radiculitis: Crystal Lee has weaned herself off the Lyrica  due to daytime drowsiness. We will continue to monitor. 12/22/2023 3. Pain of Left Wrist/ : No complaints today. S/P Carpal Tunnel Release on 06/03/2017 by Dr. Colon. Dr. Colon Following. 12/22/2023. 4. Fracture of superior pubic ramus.  Dr. Melodi Following.  Continue to monitor. 12/22/2023. 5. Bilateral Knee OA: Continue Voltaren  Gel.  Continue to monitor. Orthopedist following. 12/22/2023 6. Polyarthralgia: Continue to alternate with heat and ice therapy. Continue current medication regime. Continue to monitor. 12/22/2023. 7. Chronic Pain Syndrome: Refilled::Hydrocodone   10/325 one tablet 4 times a day as needed for pain #120. Second script sent for the following month.  We will continue the  opioid monitoring program, this consists of regular clinic visits, examinations, urine drug screen, pill counts as well as use of [place]  Controlled Substance Reporting system. A 12 month History has been reviewed on the [place]  Controlled Substance Reporting System on 12/22/2023. 8. Lumbar Compression Fracture L2 and L3:  Crystal Lee refused physical therapy. Continue with rest/ heat therapy. Continue to Monitor 12/22/2023. 9. Muscle Spasm: Continue  Flexeril  5 mg at HS. Continue to monitor. 12/22/2023 10. Right Shoulder Pain: No complaints today. Continue HEP as Tolerated. Alternate Ice and Heat Therapy. 12/22/2023  11. Bilateral  Greater Trochanter Bursitis:  Continue to Monitor. Continue HEP as Tolerated. 12/22/2023     F/U in 1 Month

## 2023-12-30 LAB — DRUG TOX MONITOR 1 W/CONF, ORAL FLD
Amphetamines: NEGATIVE ng/mL (ref <10)
Barbiturates: NEGATIVE ng/mL (ref <10)
Benzodiazepines: NEGATIVE ng/mL (ref <0.50)
Buprenorphine: NEGATIVE ng/mL (ref <0.10)
Cocaine: NEGATIVE ng/mL (ref <5.0)
Codeine: NEGATIVE ng/mL (ref <2.5)
Dihydrocodeine: 11 ng/mL — ABNORMAL HIGH (ref <2.5)
Fentanyl: NEGATIVE ng/mL (ref <0.10)
Heroin Metabolite: NEGATIVE ng/mL (ref <1.0)
Hydrocodone: 74.7 ng/mL — ABNORMAL HIGH (ref <2.5)
Hydromorphone: NEGATIVE ng/mL (ref <2.5)
MARIJUANA: NEGATIVE ng/mL (ref <2.5)
MDMA: NEGATIVE ng/mL (ref <10)
Meprobamate: NEGATIVE ng/mL (ref <2.5)
Methadone: NEGATIVE ng/mL (ref <5.0)
Morphine: NEGATIVE ng/mL (ref <2.5)
Nicotine Metabolite: NEGATIVE ng/mL (ref <5.0)
Norhydrocodone: 6.7 ng/mL — ABNORMAL HIGH (ref <2.5)
Noroxycodone: NEGATIVE ng/mL (ref <2.5)
Opiates: POSITIVE ng/mL — AB (ref <2.5)
Oxycodone: NEGATIVE ng/mL (ref <2.5)
Oxymorphone: NEGATIVE ng/mL (ref <2.5)
Phencyclidine: NEGATIVE ng/mL (ref <10)
Tapentadol: NEGATIVE ng/mL (ref <5.0)
Tramadol: NEGATIVE ng/mL (ref <5.0)
Zolpidem: NEGATIVE ng/mL (ref <5.0)

## 2023-12-30 LAB — DRUG TOX ALC METAB W/CON, ORAL FLD

## 2023-12-30 LAB — TIQ-MISC

## 2024-01-01 DIAGNOSIS — M545 Low back pain, unspecified: Secondary | ICD-10-CM | POA: Diagnosis not present

## 2024-01-09 ENCOUNTER — Ambulatory Visit (INDEPENDENT_AMBULATORY_CARE_PROVIDER_SITE_OTHER): Admitting: Obstetrics and Gynecology

## 2024-01-09 ENCOUNTER — Encounter: Payer: Self-pay | Admitting: Obstetrics and Gynecology

## 2024-01-09 VITALS — BP 117/70 | HR 78

## 2024-01-09 DIAGNOSIS — N3281 Overactive bladder: Secondary | ICD-10-CM | POA: Diagnosis not present

## 2024-01-09 DIAGNOSIS — N390 Urinary tract infection, site not specified: Secondary | ICD-10-CM

## 2024-01-09 DIAGNOSIS — M47816 Spondylosis without myelopathy or radiculopathy, lumbar region: Secondary | ICD-10-CM | POA: Diagnosis not present

## 2024-01-09 DIAGNOSIS — R351 Nocturia: Secondary | ICD-10-CM | POA: Diagnosis not present

## 2024-01-09 DIAGNOSIS — G8929 Other chronic pain: Secondary | ICD-10-CM | POA: Diagnosis not present

## 2024-01-09 NOTE — Progress Notes (Signed)
 [facility] Return Visit  SUBJECTIVE  History of Present Illness: Crystal Lee is a 89 y.o. female seen in follow-up for OAB. Plan at last visit was start PTNS. Patient did x2 sessions of PTNS and was unable to continue. She said she had some improvement with the PTNS but that is was short lasting relief for a few days.   Today she presents with concerns for significant continuing loss of urine during both daytime and nighttime.   She drinks: 64oz water /coconut water  per day, 1 coke daily, and occasional hot tea in the winter.  Endorses some stress urinary incontinence but not as bothersome as OAB.    Past Medical History: Patient  has a past medical history of Acquired trigger finger of right little finger (01/15/2019), Acute blood loss anemia (06/08/2020), Acute lower GI bleeding (06/09/2020), Acute pain of right wrist (03/28/2019), Adenomatous polyp of sigmoid colon, Aftercare (08/17/2018), Arthritis, Cancer (HCC), Carpal tunnel syndrome of right wrist (01/15/2019), Colon cancer (HCC), Colonic mass, Complication of anesthesia, Constipation, Difficulty sleeping, Diverticulosis, Diverticulosis of colon without hemorrhage, Dyslipidemia (02/02/2018), Encounter for orthopedic follow-up care (06/18/2019), Fracture of superior pubic ramus (HCC) (06/16/2018), Fractured hip (HCC), Gait abnormality (02/09/2017), Gastric polyps, GERD (gastroesophageal reflux disease), Heartburn, History of right knee joint replacement (10/17/2017), HTN (hypertension) (06/08/2020), Hyperlipidemia, Hypertension, Incisional hernia (09/16/2021), Low back pain (02/09/2017), Lower GI bleed (06/08/2020), Lumbar post-laminectomy syndrome (07/31/2018), Malignant neoplasm of descending colon (HCC), Melanoma (HCC), Memory impairment (07/12/2022), 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 behavioral disturbance, psychotic disturbance, mood disturbance, or anxiety (HCC) (05/05/2021), Neuropathy, Nocturia, OA (osteoarthritis) of hip  (05/09/2015), Osteopenia, 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   Past Surgical History: She  has a past surgical history that includes Breast surgery; Cholecystectomy; Total hip arthroplasty (2010); Mastectomy (1998); Joint replacement; Total knee arthroplasty (2001); Back surgery (1973); Skin cancer excision (2016); Appendectomy (1949); Tumor removal (2012); Total hip arthroplasty (Left, 05/09/2015); Abdominal hysterectomy (2010); Gallbladder surgery (2015); Tonsillectomy (1953); Cataract extraction (Bilateral, 2011); Esophageal manometry (N/A, 06/21/2018); 24 hour ph study (N/A, 06/21/2018); PH impedance study (06/21/2018); Anal Rectal manometry (N/A, 09/19/2019); Colonoscopy with propofol  (N/A, 06/10/2020); Esophagogastroduodenoscopy (egd) with propofol  (N/A, 06/10/2020); biopsy (06/10/2020); Submucosal tattoo injection (06/10/2020); polypectomy (06/10/2020); Colon resection (N/A, 06/12/2020); and Incisional hernia repair (N/A, 09/16/2021).   Medications: She has a current medication list which includes the following prescription(s): aspirin  ec, atorvastatin , chlorhexidine , estradiol , hydrocodone -acetaminophen , hydrocortisone , ipratropium, ketoconazole , losartan , memantine , metoprolol  tartrate, mirabegron  er, multivitamin adults, ondansetron , and cyanocobalamin , and the following Facility-Administered Medications: lidocaine .    Allergies: Patient is allergic to morphine , propoxyphene, donepezil , and tape.   Social History: Patient  reports that she quit smoking about 47 years ago. Her smoking use included cigarettes. She started smoking about 67 years ago. She has a 10 pack-year smoking history. She has never used smokeless tobacco. She reports that she does not drink alcohol and does not use drugs.     OBJECTIVE     Physical Exam: Vitals:   01/09/24 1440  BP: 117/70  Pulse: 78   Gen: No apparent distress, A&O x 3.  Detailed Urogynecologic Evaluation:  Deferred.  ASSESSMENT AND PLAN    Crystal Lee is a 89 y.o. with:  1. Overactive bladder   2. Nocturia   3. Recurrent UTI    Patient has exhausted medications options at this point with failures of Trospium, Myrbetriq , and Gemtesa. She did see small utility in the 2 PTNS sessions she did but was unable to continue treatment.  Patient given information to consider between botox and SNM. She could also consider Urethral bulking to treat the SUI and see if that support helps her symptoms as well. She did watch the informational booklet on SNM.  Patient well managed with her UTI symptoms at this time. More concerned about OAB.   Patient to follow up with Dr. Marilynne for simple CMG and decision of procedural planning.   Ebrahim Deremer G Lynsey Ange, NP

## 2024-01-09 NOTE — Patient Instructions (Signed)
 Please consider the two options of the stimulator versus the botox. I would feel more comfortable with you meeting with Dr. Marilynne to make a final decision.

## 2024-01-17 DIAGNOSIS — M5416 Radiculopathy, lumbar region: Secondary | ICD-10-CM | POA: Diagnosis not present

## 2024-01-18 ENCOUNTER — Ambulatory Visit: Payer: Medicare Other | Admitting: Physician Assistant

## 2024-01-31 DIAGNOSIS — M47816 Spondylosis without myelopathy or radiculopathy, lumbar region: Secondary | ICD-10-CM | POA: Diagnosis not present

## 2024-02-03 NOTE — Patient Instructions (Addendum)
 It was good to see you again today Shots recommended this fall COVID booster, 1 dose of RSV I would also recommend you get the updated shingles vaccine/Shingrix if you have not gotten your 2nd dose already.  Check with your pharmacist!   Flu shot today I will re-refer you to cardiology.  Please let me know if you don't hear from them in a week or 10 days  Prednisone  for 6 days for cough- let me know how this works for you

## 2024-02-03 NOTE — Progress Notes (Signed)
  [facility] [address], [address] [place], [place] [address] [phone]  Date:  02/06/2024   Name:  Crystal Lee   DOB:  1934/01/07   MRN:  [id number]  PCP:  Crystal Harlene BROCKS, MD    Chief Complaint: Cough (Coughing up some mucus for a few weeks, no meds), Medical Management of Chronic Issues (Have concern about cholesterol meds and want to get cholesterol checked, things its causing bone pain; also would like referral to cardio), and Medication Refill (Want to discuss how blood pressure meds are prescribed)   History of Present Illness:  Crystal Lee is a 89 y.o. very pleasant female patient who presents with the following:  Patient seen today with concern about a cough.  She also wants to follow-up on her cholesterol I last saw Crystal Lee to discuss her blood pressure -  history of hypertension, hyperlipidemia, sleep apnea, breast cancer and colon cancer presently in remission, mild dementia and likely due to Alzheimer's disease per neurology  She had a lap assisted partial colectomy January 2022 as well as chemotherapy. She had follow-up repair of incisional hernia in [place] 2023    Seen by neurology in July-noted to have mild dementia likely due to Alzheimer's disease.  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 Yearly -- next dose on 01/2024   Aspirin  81 Atorvastatin - not taking the last 2-3 months but her body pains did not get better  Losartan  -taking 37.5 mg   Namenda  Myrbetriq  Metoprolol  as needed for palpitations  Most recent lipid profile drawn 2/24, favorable  Recommend seasonal flu shot- give today  Recommend dose of RSV Recommend Shingrix Recommend COVID booster  Discussed the use of AI scribe software for clinical note transcription with the patient, who gave verbal consent to proceed.  History of Present Illness [name] is an 89 year old female who presents with persistent morning cough and concerns about cholesterol medication.  She has been experiencing a persistent cough, primarily in the morning, for several weeks. The cough is productive of mucus, especially when bending over, but she does not feel otherwise ill. The mucus is expelled without much effort. She has a history of smoking, having quit in 1977 after smoking half a pack a day for several years. A chest x-ray was performed in the spring.  She has concerns about her cholesterol medication, noting that she stopped taking it for a couple of months but did not notice any improvement in her symptoms, which include bone pain. She is considering resuming the medication and is interested in cholesterol treatment.  She mentions difficulty with her prescriptions running out at different times, which requires multiple trips to the pharmacy. She prefers to avoid driving and is considering pharmacy delivery options, although she has had issues with insurance and prefers to continue with her current pharmacy for now.  She reports a history of heart tests and ongoing awareness of her heartbeat, which affects her sleep.  She wonders about following up with cardiology-we tried to  refer her back to [facility] cardiology after Dr. Monetta left the practice but for some reason this referral did not come to fruition.    She has experienced issues with garbage pickup due to mobility challenges and requires assistance with trash management. She uses a walker and finds it difficult to  manage the trash can, and has requested a doctor's note to facilitate assistance from the garbage service.    Patient Active Problem List   Diagnosis Date Noted   Colon cancer [facility])    Diverticulosis    Memory impairment 07/12/2022   Incisional hernia 09/16/2021   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 behavioral disturbance, psychotic disturbance, mood disturbance, or anxiety (HCC) 05/05/2021   Stroke-like symptoms 03/25/2021   Sleep apnea    Rosacea    PONV (postoperative nausea and vomiting)    Neuropathy    Melanoma (HCC)    Hypertension    Hyperlipidemia    Fractured hip (HCC)    Arthritis    Malignant neoplasm of descending colon (HCC)    Gastric polyps    Colonic mass    Diverticulosis of colon without hemorrhage    Adenomatous polyp of sigmoid colon    Acute lower GI bleeding 06/09/2020   Lower GI bleed 06/08/2020   Acute blood loss anemia 06/08/2020   Constipation    Urinary tract infection 04/20/2019   Acquired trigger finger of right little finger 01/15/2019   Carpal tunnel syndrome of right wrist 01/15/2019   Lumbar post-laminectomy syndrome 07/31/2018   Fracture of superior pubic ramus (HCC) 06/16/2018   Radial styloid tenosynovitis of left hand 05/16/2018   Osteopenia 04/15/2018   Dyslipidemia 02/02/2018   GERD (gastroesophageal reflux disease) 02/02/2018   Trochanteric bursitis of left hip 12/23/2017   Primary osteoarthritis of left knee 10/17/2017   Stage 1 breast cancer, ER+, right (HCC) 07/04/2017   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   Trigger finger of right hand 06/10/2017   Low back pain 02/09/2017   Gait abnormality 02/09/2017   OA (osteoarthritis) of hip 05/09/2015    Past Medical History:  Diagnosis Date   Acquired trigger finger of right little finger 01/15/2019   Acute blood loss anemia 06/08/2020   Acute lower GI bleeding 06/09/2020   Acute pain of right wrist 03/28/2019   Adenomatous polyp of sigmoid colon    Aftercare 08/17/2018    Arthritis    Cancer (HCC)    HX BREAST CANCER/ SKIN CANCER   Carpal tunnel syndrome of right wrist 01/15/2019   Colon cancer (HCC)    Colonic mass    Complication of anesthesia    N/V WITH MORPHINE    Constipation    Difficulty sleeping    Diverticulosis    Diverticulosis of colon without hemorrhage    Dyslipidemia 02/02/2018   Encounter for orthopedic follow-up care 06/18/2019   Fracture of superior pubic ramus (HCC) 06/16/2018   Fractured hip (HCC)    LEFT - AUG 2016   Gait abnormality 02/09/2017   Gastric polyps    GERD (gastroesophageal reflux disease)    Heartburn    History of right knee joint replacement 10/17/2017   HTN (hypertension) 06/08/2020   Hyperlipidemia    Hypertension    Incisional hernia 09/16/2021   Low back pain 02/09/2017   Lower GI bleed 06/08/2020   Lumbar post-laminectomy syndrome 07/31/2018   Malignant neoplasm of descending colon (HCC)    Melanoma (HCC)    Memory impairment 07/12/2022   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 behavioral disturbance, psychotic disturbance,  mood disturbance, or anxiety (HCC) 05/05/2021   Neuropathy    Nocturia    OA (osteoarthritis) of hip 05/09/2015   Osteopenia    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   Pain 05/05/2021   Pain in both lower extremities 08/02/2017   Pain in right hand 06/10/2017   Pain of left hand 04/20/2018   Paresthesia 02/09/2017   PONV (postoperative nausea and vomiting)    PT STATES MORPHINE  CAUSED N/V   Primary osteoarthritis of left knee 10/17/2017   Radial styloid tenosynovitis of left hand 05/16/2018   Rosacea    Sleep apnea    Stage 1 breast cancer, ER+, right (HCC) 07/04/2017   Stroke-like symptoms 03/25/2021   Tenosynovitis, wrist 03/28/2019   Trigger finger of right hand 06/10/2017   Trochanteric bursitis of left hip 12/23/2017   Urinary tract infection 04/20/2019    Past Surgical History:  Procedure Laterality Date   40 HOUR PH STUDY N/A 06/21/2018   Procedure: 24 HOUR PH  STUDY;  Surgeon: San Sandor GAILS, DO;  Location: [facility];  Service: Gastroenterology;  Laterality: N/A;  with impedance   ABDOMINAL HYSTERECTOMY  2010   ANAL RECTAL MANOMETRY N/A 09/19/2019   Procedure: ANO RECTAL MANOMETRY;  Surgeon: Shila Gustav GAILS, MD;  Location: [facility];  Service: Endoscopy;  Laterality: N/A;   APPENDECTOMY  1949   BACK SURGERY  1973   BIOPSY  06/10/2020   Procedure: BIOPSY;  Surgeon: San Sandor GAILS, DO;  Location: [facility];  Service: Gastroenterology;;  EGD and COLON   BREAST SURGERY     CATARACT EXTRACTION Bilateral 2011   CHOLECYSTECTOMY     COLON RESECTION N/A 06/12/2020   Procedure: LAPAROSCOPIC COLON RESECTION;  Surgeon: Vernetta Berg, MD;  Location: [facility];  Service: General;  Laterality: N/A;   COLONOSCOPY WITH PROPOFOL  N/A 06/10/2020   Procedure: COLONOSCOPY WITH PROPOFOL ;  Surgeon: San Sandor GAILS, DO;  Location: [facility];  Service: Gastroenterology;  Laterality: N/A;   ESOPHAGEAL MANOMETRY N/A 06/21/2018   Procedure: ESOPHAGEAL MANOMETRY (EM);  Surgeon: San Sandor GAILS, DO;  Location: [facility];  Service: Gastroenterology;  Laterality: N/A;   ESOPHAGOGASTRODUODENOSCOPY (EGD) WITH PROPOFOL  N/A 06/10/2020   Procedure: ESOPHAGOGASTRODUODENOSCOPY (EGD) WITH PROPOFOL ;  Surgeon: San Sandor GAILS, DO;  Location: [facility];  Service: Gastroenterology;  Laterality: N/A;   GALLBLADDER SURGERY  2015   INCISIONAL HERNIA REPAIR N/A 09/16/2021   Procedure: OPEN INCISIONAL HERNIA REPAIR WITH MESH;  Surgeon: Vernetta Berg, MD;  Location: [facility];  Service: General;  Laterality: N/A;   JOINT REPLACEMENT     RT TOTAL HIP / RT TOTAL KNEE   MASTECTOMY  1998   BILATERAL    PH IMPEDANCE STUDY  06/21/2018   Procedure: PH IMPEDANCE STUDY;  Surgeon: San Sandor GAILS, DO;  Location: [facility];  Service: Gastroenterology;;   POLYPECTOMY  06/10/2020   Procedure: POLYPECTOMY;  Surgeon: San Sandor GAILS, DO;  Location: [facility];   Service: Gastroenterology;;   SKIN CANCER EXCISION  2016   RT SIDE OF NOSE   SUBMUCOSAL TATTOO INJECTION  06/10/2020   Procedure: SUBMUCOSAL TATTOO INJECTION;  Surgeon: San Sandor GAILS, DO;  Location: [facility];  Service: Gastroenterology;;   TONSILLECTOMY  1953   TOTAL HIP ARTHROPLASTY  2010   RIGHT   TOTAL HIP ARTHROPLASTY Left 05/09/2015   Procedure: LEFT TOTAL HIP ARTHROPLASTY ANTERIOR APPROACH;  Surgeon: Dempsey Moan, MD;  Location: [facility];  Service: Orthopedics;  Laterality: Left;   TOTAL KNEE ARTHROPLASTY  2001   TUMOR  REMOVAL  2012   ABDOMINAL - NON CANCEROUS    Social History   Tobacco Use   Smoking status: Former    Current packs/day: 0.00    Average packs/day: 0.5 packs/day for 20.0 years (10.0 ttl pk-yrs)    Types: Cigarettes    Start date: 02/03/1956    Quit date: 02/03/1976    Years since quitting: 48.0   Smokeless tobacco: Never  Vaping Use   Vaping status: Never Used  Substance Use Topics   Alcohol use: No   Drug use: No    Family History  Problem Relation Age of Onset   Other Mother        complications from flu   Other Father        unsure of cause   Colon cancer Neg Hx     Allergies  Allergen Reactions   Morphine  Nausea And Vomiting   Propoxyphene Nausea And Vomiting, Palpitations and Other (See Comments)    Darvocet Causes Sweats   Donepezil  Diarrhea and Other (See Comments)   Tape Rash and Other (See Comments)    Medication list has been reviewed and updated.  Current Outpatient Medications on File Prior to Visit  Medication Sig Dispense Refill   aspirin  EC 81 MG tablet Take 81 mg by mouth daily. Swallow whole.     atorvastatin  (LIPITOR) 20 MG tablet Take 1 tablet (20 mg total) by mouth daily. 90 tablet 3   chlorhexidine  (PERIDEX ) 0.12 % solution Use as directed 15 mLs in the mouth or throat 2 (two) times daily.     estradiol  (ESTRACE ) 0.1 MG/GM vaginal cream Place 0.5 g vaginally 2 (two) times a week. Place 0.5g nightly for two weeks  then twice a week after 42.5 g 11   HYDROcodone -acetaminophen  (NORCO) 10-325 MG tablet Take 1 tablet by mouth every 6 (six) hours as needed. 120 tablet 0   hydrocortisone  2.5 % ointment Apply topically 2 (two) times daily as needed.     ipratropium (ATROVENT ) 0.06 % nasal spray Place 2 sprays into both nostrils 3 (three) times daily. 15 mL 5   ketoconazole  (NIZORAL ) 2 % cream Apply topically 2 (two) times daily as needed.     memantine  (NAMENDA ) 10 MG tablet Take 1 tablet (10 mg total) by mouth at bedtime. 90 tablet 3   mirabegron  ER (MYRBETRIQ ) 50 MG TB24 tablet Take 1 tablet (50 mg total) by mouth daily. 30 tablet 11   Multiple Vitamins-Minerals (MULTIVITAMIN ADULTS) TABS Take 1 tablet by mouth daily.     ondansetron  (ZOFRAN -ODT) 4 MG disintegrating tablet Take 1 tablet (4 mg total) by mouth every 8 (eight) hours as needed for nausea or vomiting. 30 tablet 1   vitamin B-12 (CYANOCOBALAMIN ) 1000 MCG tablet Take 1,000 mcg by mouth daily.     Current Facility-Administered Medications on File Prior to Visit  Medication Dose Route Frequency Provider Last Rate Last Admin   lidocaine  (XYLOCAINE ) 2 % jelly 1 Application  1 Application Urethral Once         Review of Systems:  As per HPI- otherwise negative.   Physical Examination: Vitals:   02/06/24 1404 02/06/24 1435  BP: (!) 150/64 135/70  Pulse: 63   Temp: 97.6 F (36.4 C)   SpO2: 95%    Vitals:   02/06/24 1404  Weight: 207 lb (93.9 kg)  Height: 5' 6 (1.676 m)   Body mass index is 33.41 kg/m. Ideal Body Weight: Weight in (lb) to have BMI = 25: 154.6  GEN:  no acute distress.  Mildly obese, well-looking older lady HEENT: Atraumatic, Normocephalic.    Ears and Nose: No external deformity. CV: RRR, No M/G/R. No JVD. No thrill. No extra heart sounds. PULM: CTA B, no wheezes, crackles, rhonchi. No retractions. No resp. distress. No accessory muscle use. ABD: S, NT, ND, +BS. No rebound. No HSM. EXTR: No c/c/e PSYCH: Normally  interactive. Conversant.    Assessment and Plan: Primary hypertension - Plan: Basic metabolic panel with GFR  Malignant neoplasm of descending colon (HCC)  Dyslipidemia - Plan: Lipid panel  Essential hypertension - Plan: losartan  (COZAAR ) 25 MG tablet, metoprolol  tartrate (LOPRESSOR ) 25 MG tablet  Palpitations - Plan: Ambulatory referral to Cardiology  Productive cough - Plan: predniSONE  (DELTASONE ) 20 MG tablet  Influenza vaccine administered - Plan: Flu vaccine HIGH DOSE PF(Fluzone Trivalent)  Assessment & Plan Chronic cough with morning sputum production Chronic cough with sputum. Normal chest x-ray. History of smoking, quit in 1977. - Prescribe short course of oral steroids to reduce inflammation and assess response.  If her symptoms come back we may need to try and get her an inhaled steroid but this may be more difficult and expensive  Palpitations Persistent palpitations affecting sleep. Previous cardiac tests showed no improvement.  - Follow up with cardiology referral to Dr. Redell Shallow. Notify office if no contact within 7-10 days. I asked her to let me know if any of her symptoms are changing or getting worse in the interim  Essential hypertension Managed with losartan  and metoprolol  which she takes for palpitation reduction. Current regimen includes losartan  37.5 mg daily. Blood pressure 135/70 after resting. Difficulty splitting tablets however losartan  does not come in a 37.5 mg strength unfortunately - Refill losartan  and metoprolol  prescriptions. - Recheck blood pressure.  Hyperlipidemia Discontinued cholesterol medication due to side effects, no symptom improvement. Discussed medication necessity and option to resume. - Check cholesterol levels. - Discuss resuming cholesterol medication based on lab results.  General Health Maintenance Discussed flu vaccination benefits and shingles vaccination history. Need for second Shingrix dose if not received. -  Administer flu shot. - Check with pharmacy regarding second dose of Shingrix.  She may have already received second dose  Signed Basia Mcginty, MD

## 2024-02-06 ENCOUNTER — Ambulatory Visit (INDEPENDENT_AMBULATORY_CARE_PROVIDER_SITE_OTHER): Admitting: Family Medicine

## 2024-02-06 VITALS — BP 135/70 | HR 63 | Temp 97.6°F | Ht 66.0 in | Wt 207.0 lb

## 2024-02-06 DIAGNOSIS — I10 Essential (primary) hypertension: Secondary | ICD-10-CM

## 2024-02-06 DIAGNOSIS — Z23 Encounter for immunization: Secondary | ICD-10-CM

## 2024-02-06 DIAGNOSIS — C186 Malignant neoplasm of descending colon: Secondary | ICD-10-CM

## 2024-02-06 DIAGNOSIS — R002 Palpitations: Secondary | ICD-10-CM

## 2024-02-06 DIAGNOSIS — R058 Other specified cough: Secondary | ICD-10-CM

## 2024-02-06 DIAGNOSIS — E785 Hyperlipidemia, unspecified: Secondary | ICD-10-CM

## 2024-02-06 MED ORDER — LOSARTAN POTASSIUM 25 MG PO TABS: 37.5000 mg | ORAL_TABLET | Freq: Every day | ORAL | 3 refills | Status: AC

## 2024-02-06 MED ORDER — PREDNISONE 20 MG PO TABS: ORAL_TABLET | ORAL | 0 refills | Status: AC

## 2024-02-06 MED ORDER — METOPROLOL TARTRATE 25 MG PO TABS: ORAL_TABLET | ORAL | 3 refills | Status: AC

## 2024-02-07 ENCOUNTER — Encounter: Payer: Self-pay | Admitting: Family Medicine

## 2024-02-07 LAB — BASIC METABOLIC PANEL WITH GFR
BUN: 20 mg/dL (ref 6–23)
CO2: 29 meq/L (ref 19–32)
Calcium: 9.7 mg/dL (ref 8.4–10.5)
Chloride: 103 meq/L (ref 96–112)
Creatinine, Ser: 0.64 mg/dL (ref 0.40–1.20)
GFR: 78.96 mL/min (ref >60.00)
Glucose, Bld: 89 mg/dL (ref 70–99)
Potassium: 4.4 meq/L (ref 3.5–5.1)
Sodium: 138 meq/L (ref 135–145)

## 2024-02-07 LAB — LIPID PANEL
Cholesterol: 165 mg/dL (ref 0–200)
HDL: 41.8 mg/dL (ref >39.00)
LDL Cholesterol: 85 mg/dL (ref 0–99)
NonHDL: 123.59
Total CHOL/HDL Ratio: 4
Triglycerides: 192 mg/dL — ABNORMAL HIGH (ref 0.0–149.0)
VLDL: 38.4 mg/dL (ref 0.0–40.0)

## 2024-02-13 ENCOUNTER — Encounter: Payer: Self-pay | Admitting: Cardiology

## 2024-02-13 NOTE — Telephone Encounter (Addendum)
 error

## 2024-02-16 ENCOUNTER — Inpatient Hospital Stay: Attending: Hematology & Oncology

## 2024-02-16 ENCOUNTER — Inpatient Hospital Stay (HOSPITAL_BASED_OUTPATIENT_CLINIC_OR_DEPARTMENT_OTHER): Admitting: Hematology & Oncology

## 2024-02-16 ENCOUNTER — Inpatient Hospital Stay

## 2024-02-16 VITALS — BP 131/49 | HR 68 | Temp 98.9°F | Resp 18 | Wt 208.8 lb

## 2024-02-16 DIAGNOSIS — Z853 Personal history of malignant neoplasm of breast: Secondary | ICD-10-CM | POA: Diagnosis not present

## 2024-02-16 DIAGNOSIS — Z170 Estrogen receptor positive status [ER+]: Secondary | ICD-10-CM | POA: Diagnosis not present

## 2024-02-16 DIAGNOSIS — D696 Thrombocytopenia, unspecified: Secondary | ICD-10-CM | POA: Insufficient documentation

## 2024-02-16 DIAGNOSIS — C186 Malignant neoplasm of descending colon: Secondary | ICD-10-CM

## 2024-02-16 DIAGNOSIS — C50911 Malignant neoplasm of unspecified site of right female breast: Secondary | ICD-10-CM

## 2024-02-16 DIAGNOSIS — Z85038 Personal history of other malignant neoplasm of large intestine: Secondary | ICD-10-CM | POA: Insufficient documentation

## 2024-02-16 DIAGNOSIS — M810 Age-related osteoporosis without current pathological fracture: Secondary | ICD-10-CM | POA: Insufficient documentation

## 2024-02-16 LAB — CMP (CANCER CENTER ONLY)
ALT: 17 U/L (ref 0–44)
AST: 22 U/L (ref 15–41)
Albumin: 4.2 g/dL (ref 3.5–5.0)
Alkaline Phosphatase: 107 U/L (ref 38–126)
Anion gap: 9 (ref 5–15)
BUN: 14 mg/dL (ref 8–23)
CO2: 27 mmol/L (ref 22–32)
Calcium: 9.9 mg/dL (ref 8.9–10.3)
Chloride: 104 mmol/L (ref 98–111)
Creatinine: 0.57 mg/dL (ref 0.44–1.00)
GFR, Estimated: >60 mL/min (ref >60)
Glucose, Bld: 102 mg/dL — ABNORMAL HIGH (ref 70–99)
Potassium: 4.9 mmol/L (ref 3.5–5.1)
Sodium: 139 mmol/L (ref 135–145)
Total Bilirubin: 0.4 mg/dL (ref 0.0–1.2)
Total Protein: 7.1 g/dL (ref 6.5–8.1)

## 2024-02-16 LAB — CBC WITH DIFFERENTIAL (CANCER CENTER ONLY)
Abs Immature Granulocytes: 0.02 K/uL (ref 0.00–0.07)
Basophils Absolute: 0 K/uL (ref 0.0–0.1)
Basophils Relative: 0 %
Eosinophils Absolute: 0.3 K/uL (ref 0.0–0.5)
Eosinophils Relative: 4 %
HCT: 40.7 % (ref 36.0–46.0)
Hemoglobin: 13.4 g/dL (ref 12.0–15.0)
Immature Granulocytes: 0 %
Lymphocytes Relative: 32 %
Lymphs Abs: 2.4 K/uL (ref 0.7–4.0)
MCH: 29.3 pg (ref 26.0–34.0)
MCHC: 32.9 g/dL (ref 30.0–36.0)
MCV: 89.1 fL (ref 80.0–100.0)
Monocytes Absolute: 0.6 K/uL (ref 0.1–1.0)
Monocytes Relative: 9 %
Neutro Abs: 4.1 K/uL (ref 1.7–7.7)
Neutrophils Relative %: 55 %
Platelet Count: 140 K/uL — ABNORMAL LOW (ref 150–400)
RBC: 4.57 MIL/uL (ref 3.87–5.11)
RDW: 12.9 % (ref 11.5–15.5)
WBC Count: 7.4 K/uL (ref 4.0–10.5)
nRBC: 0 % (ref 0.0–0.2)

## 2024-02-16 LAB — CEA (ACCESS): CEA (CHCC): 2.94 ng/mL (ref 0.00–5.00)

## 2024-02-16 NOTE — Progress Notes (Signed)
 Hematology and Oncology Follow Up Visit  Crystal Lee [id number] Feb 02, 1934 89 y.o. 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 Yearly -- next dose on 07/2024    Interim History:  Crystal Lee is here today for follow-up.  She is not feeling all that well right now.  She just had a lot of extensive dental work done yesterday.  She had posts placed for dentures for her lower teeth.  As such, we really should not do Zometa  probably until February of next year.  No she is not happy not able to eat what she would like.  Hopefully when she gets the dentures in, she will be able to eat much better.  She is doing okay with respect to the breast cancer.  She has had no problems with abdominal pain.  She has had no nausea or vomiting.  She has had no obvious change in bowel or bladder habits.  The primary now of her is that she is having dentures put in.  She had teeth removed.  She had all of her teeth extracted about 10 days ago.  She has done well with this.  She had no problems with the extractions.  Her last CEA level was 2.87.  She has had a little swelling in the left leg.  Again this is more chronic.  She has had no bleeding.  She has had no cough.  There is no shortness of breath.  She has had no headache.  Overall, I would have said that her performance status is probably ECOG 2.   Wt Readings from Last 3 Encounters:  02/16/24 208 lb 12.8 oz (94.7 kg)  02/06/24 207 lb (93.9 kg)  12/22/23 207 lb 12.8 oz (94.3 kg)    Medications:  Allergies as of 02/16/2024       Reactions   Morphine  Nausea And  Vomiting   Propoxyphene Nausea And Vomiting, Palpitations, Other (See Comments)   Darvocet Causes Sweats   Donepezil  Diarrhea, Other (See Comments)   Tape Rash, Other (See Comments)        Medication List        Accurate as of February 16, 2024 12:32 PM. If you have any questions, ask your nurse or doctor.          aspirin  EC 81 MG tablet Take 81 mg by mouth daily. Swallow whole.   atorvastatin  20 MG tablet Commonly known as: LIPITOR Take 1 tablet (20 mg total) by mouth daily.   chlorhexidine  0.12 % solution Commonly known as: PERIDEX  Use as directed 15 mLs in the mouth or throat 2 (two) times daily.   cyanocobalamin  1000 MCG tablet Commonly known as: VITAMIN B12 Take 1,000 mcg by mouth daily.   estradiol  0.1 MG/GM vaginal cream Commonly known as: ESTRACE  Place 0.5 g vaginally 2 (two) times a week. Place 0.5g nightly for two weeks then twice a week after   HYDROcodone -acetaminophen  10-325 MG tablet Commonly known as: NORCO Take 1 tablet by mouth every 6 (six) hours as needed.   hydrocortisone  2.5 % ointment Apply topically 2 (two) times daily as needed.   ipratropium 0.06 %  nasal spray Commonly known as: ATROVENT  Place 2 sprays into both nostrils 3 (three) times daily.   ketoconazole  2 % cream Commonly known as: NIZORAL  Apply topically 2 (two) times daily as needed.   losartan  25 MG tablet Commonly known as: COZAAR  Take 1.5 tablets (37.5 mg total) by mouth daily.   MELATONIN PO Take 2 tablets by mouth at bedtime as needed (gummies- unsure dose).   memantine  10 MG tablet Commonly known as: NAMENDA  Take 1 tablet (10 mg total) by mouth at bedtime.   metoprolol  tartrate 25 MG tablet Commonly known as: LOPRESSOR  Take 12.5 mg (half tablet) in the morning as needed for palpitations   mirabegron  ER 50 MG Tb24 tablet Commonly known as: MYRBETRIQ  Take 1 tablet (50 mg total) by mouth daily.   Multivitamin Adults Tabs Take 1 tablet by mouth daily.    ondansetron  4 MG disintegrating tablet Commonly known as: ZOFRAN -ODT Take 1 tablet (4 mg total) by mouth every 8 (eight) hours as needed for nausea or vomiting.   predniSONE  20 MG tablet Commonly known as: DELTASONE  Take 40 mg by mouth daily for 3 days, then 20 mg by mouth daily for 3 days        Allergies:  Allergies  Allergen Reactions   Morphine  Nausea And Vomiting   Propoxyphene Nausea And Vomiting, Palpitations and Other (See Comments)    Darvocet Causes Sweats   Donepezil  Diarrhea and Other (See Comments)   Tape Rash and Other (See Comments)    Past Medical History, Surgical history, Social history, and Family History were reviewed and updated.  Review of Systems: Review of Systems  Constitutional:  Positive for chills, fever and malaise/fatigue.  HENT: Negative.    Eyes: Negative.   Respiratory: Negative.    Cardiovascular: Negative.   Gastrointestinal: Negative.   Genitourinary:  Positive for dysuria, hematuria and urgency.  Musculoskeletal: Negative.   Skin: Negative.   Neurological: Negative.   Endo/Heme/Allergies: Negative.   Psychiatric/Behavioral: Negative.       Physical Exam:  weight is 208 lb 12.8 oz (94.7 kg). Her oral temperature is 98.9 F (37.2 C). Her blood pressure is 131/49 (abnormal) and her pulse is 68. Her respiration is 18 and oxygen saturation is 96%.   Wt Readings from Last 3 Encounters:  02/16/24 208 lb 12.8 oz (94.7 kg)  02/06/24 207 lb (93.9 kg)  12/22/23 207 lb 12.8 oz (94.3 kg)   Physical Exam Vitals reviewed.  Constitutional:      Comments: Breast exam shows bilateral mastectomies.  She has good mastectomy scars.  There is no erythema or warmth.  There is no nodularity on the chest wall.  There is no bilateral axillary adenopathy.  HENT:     Head: Normocephalic and atraumatic.  Eyes:     Pupils: Pupils are equal, round, and reactive to light.  Cardiovascular:     Rate and Rhythm: Normal rate and regular rhythm.     Heart  sounds: Normal heart sounds.  Pulmonary:     Effort: Pulmonary effort is normal.     Breath sounds: Normal breath sounds.  Abdominal:     General: Bowel sounds are normal.     Palpations: Abdomen is soft.     Comments: Abdominal exam is soft.  She has decent bowel sounds.  She has well-healed laparoscopy scars.  There is no fluid wave.  There is no palpable liver or spleen tip.  Musculoskeletal:        General: No tenderness or deformity. Normal range  of motion.     Cervical back: Normal range of motion.  Lymphadenopathy:     Cervical: No cervical adenopathy.  Skin:    General: Skin is warm and dry.     Findings: No erythema or rash.  Neurological:     Mental Status: She is alert and oriented to person, place, and time.  Psychiatric:        Behavior: Behavior normal.        Thought Content: Thought content normal.        Judgment: Judgment normal.      Lab Results  Component Value Date   WBC 7.4 02/16/2024   HGB 13.4 02/16/2024   HCT 40.7 02/16/2024   MCV 89.1 02/16/2024   PLT 140 (L) 02/16/2024   Lab Results  Component Value Date   FERRITIN 26 06/23/2023   IRON 76 06/23/2023   TIBC 398 06/23/2023   UIBC 322 06/23/2023   IRONPCTSAT 19 06/23/2023   Lab Results  Component Value Date   RETICCTPCT 1.4 06/23/2023   RBC 4.57 02/16/2024   No results found for: JONATHAN BONG, [facility] Lab Results  Component Value Date   IGGSERUM 1,085 02/09/2017   IGMSERUM 336 (H) 02/09/2017   No results found for: STEPHANY CARLOTA BENSON MARKEL EARLA JOANNIE DOC VICK, SPEI   Chemistry      Component Value Date/Time   NA 139 02/16/2024 1151   NA 139 02/25/2020 1627   K 4.9 02/16/2024 1151   CL 104 02/16/2024 1151   CO2 27 02/16/2024 1151   BUN 14 02/16/2024 1151   BUN 11 02/25/2020 1627   CREATININE 0.57 02/16/2024 1151      Component Value Date/Time   CALCIUM  9.9 02/16/2024 1151   ALKPHOS 107 02/16/2024 1151   AST 22  02/16/2024 1151   ALT 17 02/16/2024 1151   BILITOT 0.4 02/16/2024 1151      Impression and Plan: Ms. Yokum is a very pleasant 89 yo caucasian female with history of bilateral stage Ia breast cancer diagnosed back in 1998.   She also has had locally advanced colon cancer stage IIIb with 3+ lymph nodes.  She completed all of her adjuvant therapy, with Xeloda , in July 2022.  Right now, we will just get her back next year.  She will not get Zometa .  I want to make sure that we give her at least a good 4 months from this dental procedure yesterday.  I am a say that she does look quite good for all that she has been through.  She is incredibly strong.  She is incredibly motivated.   Maude JONELLE Crease, MD 9/18/202512:32 PM

## 2024-02-17 ENCOUNTER — Encounter: Attending: Physical Medicine & Rehabilitation | Admitting: Registered Nurse

## 2024-02-17 ENCOUNTER — Encounter: Payer: Self-pay | Admitting: Registered Nurse

## 2024-02-17 ENCOUNTER — Ambulatory Visit: Payer: Self-pay | Admitting: Hematology & Oncology

## 2024-02-17 VITALS — BP 107/69 | HR 65 | Ht 66.0 in | Wt 209.4 lb

## 2024-02-17 DIAGNOSIS — R202 Paresthesia of skin: Secondary | ICD-10-CM | POA: Diagnosis not present

## 2024-02-17 DIAGNOSIS — Z79891 Long term (current) use of opiate analgesic: Secondary | ICD-10-CM | POA: Insufficient documentation

## 2024-02-17 DIAGNOSIS — M2550 Pain in unspecified joint: Secondary | ICD-10-CM | POA: Diagnosis not present

## 2024-02-17 DIAGNOSIS — G894 Chronic pain syndrome: Secondary | ICD-10-CM | POA: Insufficient documentation

## 2024-02-17 DIAGNOSIS — M545 Low back pain, unspecified: Secondary | ICD-10-CM | POA: Diagnosis not present

## 2024-02-17 DIAGNOSIS — M170 Bilateral primary osteoarthritis of knee: Secondary | ICD-10-CM | POA: Diagnosis not present

## 2024-02-17 DIAGNOSIS — M546 Pain in thoracic spine: Secondary | ICD-10-CM | POA: Diagnosis not present

## 2024-02-17 DIAGNOSIS — Z5181 Encounter for therapeutic drug level monitoring: Secondary | ICD-10-CM | POA: Diagnosis not present

## 2024-02-17 DIAGNOSIS — G8929 Other chronic pain: Secondary | ICD-10-CM | POA: Diagnosis not present

## 2024-02-17 MED ORDER — HYDROCODONE-ACETAMINOPHEN 10-325 MG PO TABS: 1.0000 | ORAL_TABLET | Freq: Four times a day (QID) | ORAL | 0 refills | Status: DC | PRN

## 2024-02-17 NOTE — Progress Notes (Signed)
 Subjective:    Patient ID: Crystal Lee, female    DOB: Dec 22, 1933, 89 y.o.   MRN: [id number]  HPI: Crystal Lee is a 89 y.o. female who returns for follow up appointment for chronic pain and medication refill.She  states her pain is located in her lower back, bilateral knee pain, bilateral lower extremities with numbness and tingling. Also reports generalized joint pain. She rates her pain 5. Her current exercise regime is walking short distances with her walker.   Crystal Lee Morphine  equivalent is 40.00 MME.   Last Oral Swab was Performed on 12/22/2023, it was consistent.    Pain Inventory Average Pain 5 Pain Right Now 5 My pain is sharp, burning, stabbing, tingling, and aching  In the last 24 hours, has pain interfered with the following? General activity 5 Relation with others 5 Enjoyment of life 5 What TIME of day is your pain at its worst? evening Sleep (in general) Fair  Pain is worse with: walking, bending, sitting, standing, and unsure Pain improves with: rest, heat/ice, and medication Relief from Meds: 2-3  Family History  Problem Relation Age of Onset   Other Mother        complications from flu   Other Father        unsure of cause   Colon cancer Neg Hx    Social History   Socioeconomic History   Marital status: Widowed    Spouse name: Not on file   Number of children: 3   Years of education: 16 years   Highest education level: Not on file  Occupational History   Occupation: Retired  Tobacco Use   Smoking status: Former    Current packs/day: 0.00    Average packs/day: 0.5 packs/day for 20.0 years (10.0 ttl pk-yrs)    Types: Cigarettes    Start date: 02/03/1956    Quit date: 02/03/1976    Years since quitting: 48.0   Smokeless tobacco: Never  Vaping Use   Vaping status: Never Used  Substance and Sexual Activity   Alcohol use: No   Drug use: No   Sexual activity: Not Currently    Birth control/protection: Post-menopausal  Other Topics Concern   Not  on file  Social History Narrative   Lives at home with husband.   Right-handed.   No caffeine use.   retired   Teacher, early years/pre Strain: Low Risk  (09/02/2023)   Overall Financial Resource Strain (CARDIA)    Difficulty of Paying Living Expenses: Not hard at all  Food Insecurity: No Food Insecurity (09/02/2023)   Hunger Vital Sign    Worried About Running Out of Food in the Last Year: Never true    Ran Out of Food in the Last Year: Never true  Transportation Needs: No Transportation Needs (09/02/2023)   PRAPARE - Administrator, Civil Service (Medical): No    Lack of Transportation (Non-Medical): No  Physical Activity: Inactive (09/02/2023)   Exercise Vital Sign    Days of Exercise per Week: 0 days    Minutes of Exercise per Session: 0 min  Stress: No Stress Concern Present (09/02/2023)   [name] of Occupational Health - Occupational Stress Questionnaire    Feeling of Stress : Only a little  Social Connections: Moderately Isolated (09/02/2023)   Social Connection and Isolation Panel    Frequency of Communication with Friends and Family: Twice a week    Frequency of Social Gatherings with Friends and Family: Three  times a week    Attends Religious Services: More than 4 times per year    Active Member of Clubs or Organizations: No    Attends Banker Meetings: Never    Marital Status: Widowed   Past Surgical History:  Procedure Laterality Date   41 HOUR PH STUDY N/A 06/21/2018   Procedure: 24 HOUR PH STUDY;  Surgeon: San Sandor GAILS, DO;  Location: [facility];  Service: Gastroenterology;  Laterality: N/A;  with impedance   ABDOMINAL HYSTERECTOMY  2010   ANAL RECTAL MANOMETRY N/A 09/19/2019   Procedure: ANO RECTAL MANOMETRY;  Surgeon: Shila Gustav GAILS, MD;  Location: [facility];  Service: Endoscopy;  Laterality: N/A;   APPENDECTOMY  1949   BACK SURGERY  1973   BIOPSY  06/10/2020   Procedure: BIOPSY;  Surgeon:  San Sandor GAILS, DO;  Location: [facility];  Service: Gastroenterology;;  EGD and COLON   BREAST SURGERY     CATARACT EXTRACTION Bilateral 2011   CHOLECYSTECTOMY     COLON RESECTION N/A 06/12/2020   Procedure: LAPAROSCOPIC COLON RESECTION;  Surgeon: Vernetta Berg, MD;  Location: [facility];  Service: General;  Laterality: N/A;   COLONOSCOPY WITH PROPOFOL  N/A 06/10/2020   Procedure: COLONOSCOPY WITH PROPOFOL ;  Surgeon: San Sandor GAILS, DO;  Location: [facility];  Service: Gastroenterology;  Laterality: N/A;   ESOPHAGEAL MANOMETRY N/A 06/21/2018   Procedure: ESOPHAGEAL MANOMETRY (EM);  Surgeon: San Sandor GAILS, DO;  Location: [facility];  Service: Gastroenterology;  Laterality: N/A;   ESOPHAGOGASTRODUODENOSCOPY (EGD) WITH PROPOFOL  N/A 06/10/2020   Procedure: ESOPHAGOGASTRODUODENOSCOPY (EGD) WITH PROPOFOL ;  Surgeon: San Sandor GAILS, DO;  Location: [facility];  Service: Gastroenterology;  Laterality: N/A;   GALLBLADDER SURGERY  2015   INCISIONAL HERNIA REPAIR N/A 09/16/2021   Procedure: OPEN INCISIONAL HERNIA REPAIR WITH MESH;  Surgeon: Vernetta Berg, MD;  Location: [facility];  Service: General;  Laterality: N/A;   JOINT REPLACEMENT     RT TOTAL HIP / RT TOTAL KNEE   MASTECTOMY  1998   BILATERAL    PH IMPEDANCE STUDY  06/21/2018   Procedure: PH IMPEDANCE STUDY;  Surgeon: San Sandor GAILS, DO;  Location: [facility];  Service: Gastroenterology;;   POLYPECTOMY  06/10/2020   Procedure: POLYPECTOMY;  Surgeon: San Sandor GAILS, DO;  [facility]: [facility];  Service: Gastroenterology;;   SKIN CANCER EXCISION  2016   RT SIDE OF NOSE   SUBMUCOSAL TATTOO INJECTION  06/10/2020   Procedure: SUBMUCOSAL TATTOO INJECTION;  Surgeon: San Sandor GAILS, DO;  Location: [facility];  Service: Gastroenterology;;   TONSILLECTOMY  1953   TOTAL HIP ARTHROPLASTY  2010   RIGHT   TOTAL HIP ARTHROPLASTY Left 05/09/2015   Procedure: LEFT TOTAL HIP ARTHROPLASTY ANTERIOR APPROACH;  Surgeon: Dempsey Moan, MD;  Location: [facility];  Service: Orthopedics;  Laterality: Left;   TOTAL KNEE ARTHROPLASTY  2001   TUMOR REMOVAL  2012   ABDOMINAL - NON CANCEROUS   Past Surgical History:  Procedure Laterality Date   45 HOUR PH STUDY N/A 06/21/2018   Procedure: 24 HOUR PH STUDY;  Surgeon: San Sandor GAILS, DO;  [facility]: [facility];  Service: Gastroenterology;  Laterality: N/A;  with impedance   ABDOMINAL HYSTERECTOMY  2010   ANAL RECTAL MANOMETRY N/A 09/19/2019   Procedure: ANO RECTAL MANOMETRY;  Surgeon: Shila Gustav GAILS, MD;  [facility]: [facility];  Service: Endoscopy;  Laterality: N/A;   APPENDECTOMY  1949   BACK SURGERY  1973   BIOPSY  06/10/2020   Procedure: BIOPSY;  Surgeon: San Sandor GAILS, DO;  [facility]: [facility];  Service: Gastroenterology;;  EGD and COLON   BREAST SURGERY     CATARACT EXTRACTION Bilateral 2011   CHOLECYSTECTOMY     COLON RESECTION N/A 06/12/2020   Procedure: LAPAROSCOPIC COLON RESECTION;  Surgeon: Vernetta Berg, MD;  Location: [facility];  Service: General;  Laterality: N/A;   COLONOSCOPY WITH PROPOFOL  N/A 06/10/2020   Procedure: COLONOSCOPY WITH PROPOFOL ;  Surgeon: San Sandor GAILS, DO;  [facility];  Service: Gastroenterology;  Laterality: N/A;   ESOPHAGEAL MANOMETRY N/A 06/21/2018   Procedure: ESOPHAGEAL MANOMETRY (EM);  Surgeon: San Sandor GAILS, DO;  [facility];  Service: Gastroenterology;  Laterality: N/A;   ESOPHAGOGASTRODUODENOSCOPY (EGD) WITH PROPOFOL  N/A 06/10/2020   Procedure: ESOPHAGOGASTRODUODENOSCOPY (EGD) WITH PROPOFOL ;  Surgeon: San Sandor GAILS, DO;  [facility]: [facility];  Service: Gastroenterology;  Laterality: N/A;   GALLBLADDER SURGERY  2015   INCISIONAL HERNIA REPAIR N/A 09/16/2021   Procedure: OPEN INCISIONAL HERNIA REPAIR WITH MESH;  Surgeon: Vernetta Berg, MD;  [facility];  Service: General;  Laterality: N/A;   JOINT REPLACEMENT     RT TOTAL HIP / RT TOTAL KNEE   MASTECTOMY  1998   BILATERAL     PH IMPEDANCE STUDY  06/21/2018   Procedure: PH IMPEDANCE STUDY;  Surgeon: San Sandor GAILS, DO;  Location: [facility];  Service: Gastroenterology;;   POLYPECTOMY  06/10/2020   Procedure: POLYPECTOMY;  Surgeon: San Sandor GAILS, DO;  [facility]: [facility];  Service: Gastroenterology;;   SKIN CANCER EXCISION  2016   RT SIDE OF NOSE   SUBMUCOSAL TATTOO INJECTION  06/10/2020   Procedure: SUBMUCOSAL TATTOO INJECTION;  Surgeon: San Sandor GAILS, DO;  [facility]: [facility];  Service: Gastroenterology;;   TONSILLECTOMY  1953   TOTAL HIP ARTHROPLASTY  2010   RIGHT   TOTAL HIP ARTHROPLASTY Left 05/09/2015   Procedure: LEFT TOTAL HIP ARTHROPLASTY ANTERIOR APPROACH;  Surgeon: Dempsey Moan, MD;  Location: [facility];  Service: Orthopedics;  Laterality: Left;   TOTAL KNEE ARTHROPLASTY  2001   TUMOR REMOVAL  2012   ABDOMINAL - NON CANCEROUS   Past Medical History:  Diagnosis Date   Acquired trigger finger of right little finger 01/15/2019   Acute blood loss anemia 06/08/2020   Acute lower GI bleeding 06/09/2020   Acute pain of right wrist 03/28/2019   Adenomatous polyp of sigmoid colon    Aftercare 08/17/2018   Arthritis    Cancer (HCC)    HX BREAST CANCER/ SKIN CANCER   Carpal tunnel syndrome of right wrist 01/15/2019   Colon cancer (HCC)    Colonic mass    Complication of anesthesia    N/V WITH MORPHINE    Constipation    Difficulty sleeping    Diverticulosis    Diverticulosis of colon without hemorrhage    Dyslipidemia 02/02/2018   Encounter for orthopedic follow-up care 06/18/2019   Fracture of superior pubic ramus (HCC) 06/16/2018   Fractured hip (HCC)    LEFT - AUG 2016   Gait abnormality 02/09/2017   Gastric polyps    GERD (gastroesophageal reflux disease)    Heartburn    History of right knee joint replacement 10/17/2017   HTN (hypertension) 06/08/2020   Hyperlipidemia    Hypertension    Incisional hernia 09/16/2021   Low back pain 02/09/2017   Lower GI bleed  06/08/2020   Lumbar post-laminectomy syndrome 07/31/2018   Malignant neoplasm of descending colon (HCC)    Melanoma (HCC)    Memory impairment  07/12/2022   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 behavioral disturbance, psychotic disturbance, mood disturbance, or anxiety (HCC) 05/05/2021   Neuropathy    Nocturia    OA (osteoarthritis) of hip 05/09/2015   Osteopenia    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   Pain 05/05/2021   Pain in both lower extremities 08/02/2017   Pain in right hand 06/10/2017   Pain of left hand 04/20/2018   Paresthesia 02/09/2017   PONV (postoperative nausea and vomiting)    PT STATES MORPHINE  CAUSED N/V   Primary osteoarthritis of left knee 10/17/2017   Radial styloid tenosynovitis of left hand 05/16/2018   Rosacea    Sleep apnea    Stage 1 breast cancer, ER+, right (HCC) 07/04/2017   Stroke-like symptoms 03/25/2021   Tenosynovitis, wrist 03/28/2019   Trigger finger of right hand 06/10/2017   Trochanteric bursitis of left hip 12/23/2017   Urinary tract infection 04/20/2019   BP 107/69 (BP Location: Left Arm, Patient Position: Sitting, Cuff Size: Large)   Pulse 65   Ht 5' 6 (1.676 m)   Wt 209 lb 6.4 oz (95 kg)   SpO2 93%   BMI 33.80 kg/m   Opioid Risk Score:   Fall Risk Score:  `1  Depression screen PHQ 2/9     02/16/2024   12:00 PM 12/22/2023   11:23 AM 10/27/2023   11:54 AM 10/27/2023   11:52 AM 09/02/2023    1:16 PM 09/01/2023   11:55 AM 08/02/2023   11:33 AM  Depression screen PHQ 2/9  Decreased Interest 0 0 2 0 0 0 0  Down, Depressed, Hopeless 0 0 2 0 0 0 0  PHQ - 2 Score 0 0 4 0 0 0 0  Altered sleeping  0 2      Tired, decreased energy  0 2      Change in appetite   0      Feeling bad or failure about yourself   0 0      Trouble concentrating   0      Moving slowly or fidgety/restless  0 0      Suicidal thoughts  0 0      PHQ-9 Score  0 8      Difficult doing work/chores  Not difficult at all Somewhat difficult          Review of  Systems  Musculoskeletal:  Positive for arthralgias, back pain and myalgias.       Low back pain, bilateral leg, knee, joint foot pain  All other systems reviewed and are negative.      Objective:   Physical Exam Vitals and nursing note reviewed.  Constitutional:      Appearance: Normal appearance.  Cardiovascular:     Rate and Rhythm: Normal rate and regular rhythm.     Pulses: Normal pulses.     Heart sounds: Normal heart sounds.  Pulmonary:     Effort: Pulmonary effort is normal.     Breath sounds: Normal breath sounds.  Musculoskeletal:     Comments: Normal Muscle Bulk and Muscle Testing Reveals:  Upper Extremities: Full ROM and Muscle Strength 5/5 Lumbar Paraspinal Tenderness: L-4-L-5 Lower Extremities: Full ROM and Muscle Strength 5/5 Arises from chair slowly using walker for support Narrow Based  Gait     Skin:    General: Skin is warm and dry.  Neurological:     Mental Status: She is alert and oriented to person, place, and time.  Psychiatric:  Mood and Affect: Mood normal.        Behavior: Behavior normal.          Assessment & Plan:  1.Chronic Bilateral :Low Back Pain  Leg pain: Paresthesia: Continue HEP as Tolerated. Continue to Monitor. 02/17/2024 2. Paresthesia Harrie Radiculitis: Crystal Lee has weaned herself off the Lyrica  due to daytime drowsiness. We will continue to monitor. 02/17/2024 3. Pain of Left Wrist/ : No complaints today. S/P Carpal Tunnel Release on 06/03/2017 by Dr. Colon. Dr. Colon Following. 02/17/2024. 4. Fracture of superior pubic ramus.  Dr. Melodi Following.  Continue to monitor. 02/17/2024. 5. Bilateral Knee OA: Continue Voltaren  Gel.  Continue to monitor. Orthopedist following. 02/17/2024 6. Polyarthralgia: Continue to alternate with heat and ice therapy. Continue current medication regime. Continue to monitor. 02/17/2024. 7. Chronic Pain Syndrome: Refilled::Hydrocodone   10/325 one tablet 4 times a day as needed for pain  #120. Second script sent for the following month.  We will continue the opioid monitoring program, this consists of regular clinic visits, examinations, urine drug screen, pill counts as well as use of [place]  Controlled Substance Reporting system. A 12 month History has been reviewed on the [place]  Controlled Substance Reporting System on 02/17/2024. 8. Lumbar Compression Fracture L2 and L3:  Crystal Lee refused physical therapy. Continue with rest/ heat therapy. Continue to Monitor 02/17/2024. 9. Muscle Spasm: Continue  Flexeril  5 mg at HS. Continue to monitor. 02/17/2024 10. Right Shoulder Pain: No complaints today. Continue HEP as Tolerated. Alternate Ice and Heat Therapy. 02/17/2024  11. Bilateral  Greater Trochanter Bursitis:  No complaints today. Continue to Monitor. Continue HEP as Tolerated. 02/17/2024     F/U in 2 Months

## 2024-02-29 DIAGNOSIS — H52203 Unspecified astigmatism, bilateral: Secondary | ICD-10-CM | POA: Diagnosis not present

## 2024-02-29 DIAGNOSIS — Z961 Presence of intraocular lens: Secondary | ICD-10-CM | POA: Diagnosis not present

## 2024-03-09 ENCOUNTER — Encounter: Payer: Self-pay | Admitting: Obstetrics and Gynecology

## 2024-03-09 ENCOUNTER — Ambulatory Visit (INDEPENDENT_AMBULATORY_CARE_PROVIDER_SITE_OTHER): Admitting: Obstetrics and Gynecology

## 2024-03-09 VITALS — BP 136/76 | HR 70

## 2024-03-09 DIAGNOSIS — N3281 Overactive bladder: Secondary | ICD-10-CM | POA: Diagnosis not present

## 2024-03-09 DIAGNOSIS — N393 Stress incontinence (female) (male): Secondary | ICD-10-CM | POA: Diagnosis not present

## 2024-03-09 NOTE — Progress Notes (Signed)
 [facility] Return Visit  SUBJECTIVE  History of Present Illness: Crystal Lee is a 89 y.o. female seen in follow-up for mixed incontinence. Patient did x2 sessions of PTNS and was unable to continue. Has also failed medications: Trospium, Myrbetriq , and Gemtesa.   She is most bothered by waking up at night. She is urinating every 2 hours. At night, by the time she stands up she is already wet. Also has frequency during the day but leakage is not consistent. Less bothered by SUI symptoms.   Drinking- water , coconut water , milk and occasional coke or juice   Past Medical History: Patient  has a past medical history of Acquired trigger finger of right little finger (01/15/2019), Acute blood loss anemia (06/08/2020), Acute lower GI bleeding (06/09/2020), Acute pain of right wrist (03/28/2019), Adenomatous polyp of sigmoid colon, Aftercare (08/17/2018), Arthritis, Cancer (HCC), Carpal tunnel syndrome of right wrist (01/15/2019), Colon cancer (HCC), Colonic mass, Complication of anesthesia, Constipation, Difficulty sleeping, Diverticulosis, Diverticulosis of colon without hemorrhage, Dyslipidemia (02/02/2018), Encounter for orthopedic follow-up care (06/18/2019), Fracture of superior pubic ramus (HCC) (06/16/2018), Fractured hip (HCC), Gait abnormality (02/09/2017), Gastric polyps, GERD (gastroesophageal reflux disease), Heartburn, History of right knee joint replacement (10/17/2017), HTN (hypertension) (06/08/2020), Hyperlipidemia, Hypertension, Incisional hernia (09/16/2021), Low back pain (02/09/2017), Lower GI bleed (06/08/2020), Lumbar post-laminectomy syndrome (07/31/2018), Malignant neoplasm of descending colon (HCC), Melanoma (HCC), Memory impairment (07/12/2022), 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 behavioral disturbance, psychotic disturbance, mood disturbance, or anxiety (HCC) (05/05/2021), Neuropathy, Nocturia, OA (osteoarthritis) of hip (05/09/2015), Osteopenia, Osteoporosis due to aromatase  inhibitor (07/04/2017), Pain (05/05/2021), Pain in both lower extremities (08/02/2017), Pain in right hand (06/10/2017), Pain of left hand (04/20/2018), Paresthesia (02/09/2017), PONV (postoperative nausea and vomiting), Primary osteoarthritis of left knee (10/17/2017), Radial styloid tenosynovitis of left hand (05/16/2018), Rosacea, Sleep apnea, Stage 1 breast cancer, ER+, right (HCC) (07/04/2017), Stroke-like symptoms (03/25/2021), Tenosynovitis, wrist (03/28/2019), Trigger finger of right hand (06/10/2017), Trochanteric bursitis of left hip (12/23/2017), and Urinary tract infection (04/20/2019).   Past Surgical History: She  has a past surgical history that includes Breast surgery; Cholecystectomy; Total hip arthroplasty (2010); Mastectomy (1998); Joint replacement; Total knee arthroplasty (2001); Back surgery (1973); Skin cancer excision (2016); Appendectomy (1949); Tumor removal (2012); Total hip arthroplasty (Left, 05/09/2015); Abdominal hysterectomy (2010); Gallbladder surgery (2015); Tonsillectomy (1953); Cataract extraction (Bilateral, 2011); Esophageal manometry (N/A, 06/21/2018); 24 hour ph study (N/A, 06/21/2018); PH impedance study (06/21/2018); Anal Rectal manometry (N/A, 09/19/2019); Colonoscopy with propofol  (N/A, 06/10/2020); Esophagogastroduodenoscopy (egd) with propofol  (N/A, 06/10/2020); biopsy (06/10/2020); Submucosal tattoo injection (06/10/2020); polypectomy (06/10/2020); Colon resection (N/A, 06/12/2020); and Incisional hernia repair (N/A, 09/16/2021).   Medications: She has a current medication list which includes the following prescription(s): aspirin  ec, chlorhexidine , estradiol , hydrocodone -acetaminophen , hydrocortisone , ipratropium, ketoconazole , losartan , melatonin, memantine , metoprolol  tartrate, mirabegron  er, multivitamin adults, ondansetron , prednisone , cyanocobalamin , and atorvastatin , and the following Facility-Administered Medications: lidocaine .   Allergies: Patient is allergic to  morphine , propoxyphene, donepezil , and tape.   Social History: Patient  reports that she quit smoking about 48 years ago. Her smoking use included cigarettes. She started smoking about 68 years ago. She has a 10 pack-year smoking history. She has never used smokeless tobacco. She reports that she does not drink alcohol and does not use drugs.     OBJECTIVE     Physical Exam: Vitals:   03/09/24 0841  BP: 136/76  Pulse: 70   Gen: No apparent distress, A&O x 3.  Detailed Urogynecologic Evaluation:  Deferred.    ASSESSMENT AND PLAN    Crystal Lee  is a 89 y.o. with:  1. Overactive bladder   2. SUI (stress urinary incontinence, female)     - For refractory OAB we reviewed the procedure for intravesical Botox injection with cystoscopy in the office and reviewed the risks, benefits and alternatives of treatment including but not limited to infection, need for self-catheterization and need for repeat therapy.  We discussed that there is a 5-15% chance of needing to catheterize with Botox and that this usually resolves in a few months; however can persist for longer periods of time.  Typically Botox injections would need to be repeated every 3-12 months since this is not a permanent therapy.  - We discussed the role of sacral neuromodulation and how it works. It requires a test phase, and documentation of bladder function via diary. After a successful test period, a permanent wire and generator are placed in the OR. The battery lasts 5 years on average and would need to be replaced surgically.  The goal of this therapy is at least a 50% improvement in symptoms. It is NOT realistic to expect a 100% cure.  We reviewed the fact that about 30% of patients fail the test phase and are not candidates for permanent generator placement.   - She prefers a procedure where she will not need anesthesia, so wants to try botox. Will have her return for self cath teaching and get her scheduled for procedure.  -  Can reassess SUI symptoms after botox   Crystal LOISE Caper, MD

## 2024-04-19 NOTE — Progress Notes (Signed)
 HPI: Follow-up chest pain, hypertension and hyperlipidemia.  Patient previously followed by Dr. Revankar but transitioning to me.  ABIs August 2021 normal.  Echocardiogram October 2022 showed normal LV function.  Head and neck CTA October 2022 negative for large vessel occlusion and no hemodynamically significant stenosis.  Nuclear study February 2025 showed ejection fraction 67% and no ischemia or infarction.  Monitor March 2025 showed sinus rhythm with short runs of SVT, PVCs and PACs.  Since last seen patient continues to complain of chest pain.  It is in the left breast area without radiation.  Lasts anywhere from minutes to several hours.  Not exertional and no associated symptoms.  She has not had syncope.  She denies dyspnea.  Current Outpatient Medications  Medication Sig Dispense Refill   aspirin  EC 81 MG tablet Take 81 mg by mouth daily. Swallow whole.     atorvastatin  (LIPITOR) 20 MG tablet Take 1 tablet (20 mg total) by mouth daily. 90 tablet 3   chlorhexidine  (PERIDEX ) 0.12 % solution Use as directed 15 mLs in the mouth or throat 2 (two) times daily.     estradiol  (ESTRACE ) 0.1 MG/GM vaginal cream Place 0.5 g vaginally 2 (two) times a week. Place 0.5g nightly for two weeks then twice a week after 42.5 g 11   HYDROcodone -acetaminophen  (NORCO) 10-325 MG tablet Take 1 tablet by mouth every 6 (six) hours as needed. 120 tablet 0   hydrocortisone  2.5 % ointment Apply topically 2 (two) times daily as needed.     ipratropium (ATROVENT ) 0.06 % nasal spray Place 2 sprays into both nostrils 3 (three) times daily. 15 mL 5   ketoconazole  (NIZORAL ) 2 % cream Apply topically 2 (two) times daily as needed.     losartan  (COZAAR ) 25 MG tablet Take 1.5 tablets (37.5 mg total) by mouth daily. 135 tablet 3   MELATONIN PO Take 2 tablets by mouth at bedtime as needed (gummies- unsure dose).     memantine  (NAMENDA ) 10 MG tablet Take 1 tablet (10 mg total) by mouth at bedtime. 90 tablet 3   metoprolol   tartrate (LOPRESSOR ) 25 MG tablet Take 12.5 mg (half tablet) in the morning as needed for palpitations 45 tablet 3   mirabegron  ER (MYRBETRIQ ) 50 MG TB24 tablet Take 1 tablet (50 mg total) by mouth daily. 30 tablet 11   Multiple Vitamins-Minerals (MULTIVITAMIN ADULTS) TABS Take 1 tablet by mouth daily.     ondansetron  (ZOFRAN -ODT) 4 MG disintegrating tablet Take 1 tablet (4 mg total) by mouth every 8 (eight) hours as needed for nausea or vomiting. 30 tablet 1   predniSONE  (DELTASONE ) 20 MG tablet Take 40 mg by mouth daily for 3 days, then 20 mg by mouth daily for 3 days 9 tablet 0   vitamin B-12 (CYANOCOBALAMIN ) 1000 MCG tablet Take 1,000 mcg by mouth daily.     Current Facility-Administered Medications  Medication Dose Route Frequency Provider Last Rate Last Admin   lidocaine  (XYLOCAINE ) 2 % jelly 1 Application  1 Application Urethral Once          Past Medical History:  Diagnosis Date   Acquired trigger finger of right little finger 01/15/2019   Acute blood loss anemia 06/08/2020   Acute lower GI bleeding 06/09/2020   Acute pain of right wrist 03/28/2019   Adenomatous polyp of sigmoid colon    Aftercare 08/17/2018   Arthritis    Cancer (HCC)    HX BREAST CANCER/ SKIN CANCER   Carpal tunnel syndrome of  right wrist 01/15/2019   Colon cancer [facility])    Colonic mass    Complication of anesthesia    N/V WITH MORPHINE    Constipation    Difficulty sleeping    Diverticulosis    Diverticulosis of colon without hemorrhage    Dyslipidemia 02/02/2018   Encounter for orthopedic follow-up care 06/18/2019   Fracture of superior pubic ramus (HCC) 06/16/2018   Fractured hip (HCC)    LEFT - AUG 2016   Gait abnormality 02/09/2017   Gastric polyps    GERD (gastroesophageal reflux disease)    Heartburn    History of right knee joint replacement 10/17/2017   HTN (hypertension) 06/08/2020   Hyperlipidemia    Hypertension    Incisional hernia 09/16/2021   Low back pain 02/09/2017   Lower GI  bleed 06/08/2020   Lumbar post-laminectomy syndrome 07/31/2018   Malignant neoplasm of descending colon (HCC)    Melanoma (HCC)    Memory impairment 07/12/2022   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 behavioral disturbance, psychotic disturbance, mood disturbance, or anxiety (HCC) 05/05/2021   Neuropathy    Nocturia    OA (osteoarthritis) of hip 05/09/2015   Osteopenia    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   Pain 05/05/2021   Pain in both lower extremities 08/02/2017   Pain in right hand 06/10/2017   Pain of left hand 04/20/2018   Paresthesia 02/09/2017   PONV (postoperative nausea and vomiting)    PT STATES MORPHINE  CAUSED N/V   Primary osteoarthritis of left knee 10/17/2017   Radial styloid tenosynovitis of left hand 05/16/2018   Rosacea    Sleep apnea    Stage 1 breast cancer, ER+, right (HCC) 07/04/2017   Stroke-like symptoms 03/25/2021   Tenosynovitis, wrist 03/28/2019   Trigger finger of right hand 06/10/2017   Trochanteric bursitis of left hip 12/23/2017   Urinary tract infection 04/20/2019    Past Surgical History:  Procedure Laterality Date   24 HOUR PH STUDY N/A 06/21/2018   Procedure: 24 HOUR PH STUDY;  Surgeon: San Sandor GAILS, DO;  Location: [facility];  Service: Gastroenterology;  Laterality: N/A;  with impedance   ABDOMINAL HYSTERECTOMY  2010   ANAL RECTAL MANOMETRY N/A 09/19/2019   Procedure: ANO RECTAL MANOMETRY;  Surgeon: Shila Gustav GAILS, MD;  Location: [facility];  Service: Endoscopy;  Laterality: N/A;   APPENDECTOMY  1949   BACK SURGERY  1973   BIOPSY  06/10/2020   Procedure: BIOPSY;  Surgeon: San Sandor GAILS, DO;  Location: [facility];  Service: Gastroenterology;;  EGD and COLON   BREAST SURGERY     CATARACT EXTRACTION Bilateral 2011   CHOLECYSTECTOMY     COLON RESECTION N/A 06/12/2020   Procedure: LAPAROSCOPIC COLON RESECTION;  Surgeon: Vernetta Berg, MD;  Location: [facility];  Service: General;  Laterality: N/A;   COLONOSCOPY WITH  PROPOFOL  N/A 06/10/2020   Procedure: COLONOSCOPY WITH PROPOFOL ;  Surgeon: San Sandor GAILS, DO;  Location: [facility];  Service: Gastroenterology;  Laterality: N/A;   ESOPHAGEAL MANOMETRY N/A 06/21/2018   Procedure: ESOPHAGEAL MANOMETRY (EM);  Surgeon: San Sandor GAILS, DO;  Location: [facility];  Service: Gastroenterology;  Laterality: N/A;   ESOPHAGOGASTRODUODENOSCOPY (EGD) WITH PROPOFOL  N/A 06/10/2020   Procedure: ESOPHAGOGASTRODUODENOSCOPY (EGD) WITH PROPOFOL ;  Surgeon: San Sandor GAILS, DO;  Location: [facility];  Service: Gastroenterology;  Laterality: N/A;   GALLBLADDER SURGERY  2015   INCISIONAL HERNIA REPAIR N/A 09/16/2021   Procedure: OPEN INCISIONAL HERNIA REPAIR WITH MESH;  Surgeon: Vernetta Berg, MD;  Location: [facility];  Service: General;  Laterality: N/A;   JOINT REPLACEMENT     RT TOTAL HIP / RT TOTAL KNEE   MASTECTOMY  1998   BILATERAL    PH IMPEDANCE STUDY  06/21/2018   Procedure: PH IMPEDANCE STUDY;  Surgeon: San Sandor GAILS, DO;  [facility]: [facility];  Service: Gastroenterology;;   POLYPECTOMY  06/10/2020   Procedure: POLYPECTOMY;  Surgeon: San Sandor GAILS, DO;  [facility]: [facility];  Service: Gastroenterology;;   SKIN CANCER EXCISION  2016   RT SIDE OF NOSE   SUBMUCOSAL TATTOO INJECTION  06/10/2020   Procedure: SUBMUCOSAL TATTOO INJECTION;  Surgeon: San Sandor GAILS, DO;  Location: [facility];  Service: Gastroenterology;;   TONSILLECTOMY  1953   TOTAL HIP ARTHROPLASTY  2010   RIGHT   TOTAL HIP ARTHROPLASTY Left 05/09/2015   Procedure: LEFT TOTAL HIP ARTHROPLASTY ANTERIOR APPROACH;  Surgeon: Dempsey Moan, MD;  Location: [facility];  Service: Orthopedics;  Laterality: Left;   TOTAL KNEE ARTHROPLASTY  2001   TUMOR REMOVAL  2012   ABDOMINAL - NON CANCEROUS    Social History   Socioeconomic History   Marital status: Widowed    Spouse name: Not on file   Number of children: 3   Years of education: 16 years   Highest education level: Not on file   Occupational History   Occupation: Retired  Tobacco Use   Smoking status: Former    Current packs/day: 0.00    Average packs/day: 0.5 packs/day for 20.0 years (10.0 ttl pk-yrs)    Types: Cigarettes    Start date: 02/03/1956    Quit date: 02/03/1976    Years since quitting: 48.2   Smokeless tobacco: Never  Vaping Use   Vaping status: Never Used  Substance and Sexual Activity   Alcohol use: No   Drug use: No   Sexual activity: Not Currently    Birth control/protection: Post-menopausal  Other Topics Concern   Not on file  Social History Narrative   Lives at home with husband.   Right-handed.   No caffeine use.   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  (09/02/2023)   Overall Financial Resource Strain (CARDIA)    Difficulty of Paying Living Expenses: Not hard at all  Food Insecurity: No Food Insecurity (09/02/2023)   Hunger Vital Sign    Worried About Running Out of Food in the Last Year: Never true    Ran Out of Food in the Last Year: Never true  Transportation Needs: No Transportation Needs (09/02/2023)   PRAPARE - Administrator, Civil Service (Medical): No    Lack of Transportation (Non-Medical): No  Physical Activity: Inactive (09/02/2023)   Exercise Vital Sign    Days of Exercise per Week: 0 days    Minutes of Exercise per Session: 0 min  Stress: No Stress Concern Present (09/02/2023)   [name] of Occupational Health - Occupational Stress Questionnaire    Feeling of Stress : Only a little  Social Connections: Moderately Isolated (09/02/2023)   Social Connection and Isolation Panel    Frequency of Communication with Friends and Family: Twice a week    Frequency of Social Gatherings with Friends and Family: Three times a week    Attends Religious Services: More than 4 times per year    Active Member of Clubs or Organizations: No    Attends Banker Meetings: Never    Marital Status: Widowed  Intimate Partner  Violence: Not At  Risk (09/02/2023)   Humiliation, Afraid, Rape, and Kick questionnaire    Fear of Current or Ex-Partner: No    Emotionally Abused: No    Physically Abused: No    Sexually Abused: No    Family History  Problem Relation Age of Onset   Other Mother        complications from flu   Other Father        unsure of cause   Colon cancer Neg Hx     ROS: no fevers or chills, productive cough, hemoptysis, dysphasia, odynophagia, melena, hematochezia, dysuria, hematuria, rash, seizure activity, orthopnea, PND, pedal edema, claudication. Remaining systems are negative.  Physical Exam: Well-developed well-nourished in no acute distress.  Skin is warm and dry.  HEENT is normal.  Neck is supple.  Chest is clear to auscultation with normal expansion.  Cardiovascular exam is regular rate and rhythm.  Abdominal exam nontender or distended. No masses palpated. Extremities show no edema. neuro grossly intact  EKG Interpretation Date/Time:  Thursday May 03 2024 09:34:00 EST Ventricular Rate:  66 PR Interval:  186 QRS Duration:  94 QT Interval:  408 QTC Calculation: 427 R Axis:   -37  Text Interpretation: Normal sinus rhythm Left axis deviation Confirmed by Pietro Rogue ([id number]) on 05/03/2024 9:36:23 AM    A/P  1 history of chest pain-follow-up nuclear study showed no ischemia.  However symptoms persist and she is concerned.  I reviewed previous CTA to rule out pulmonary embolus from August 2024.  She appears to have mild coronary calcification.  Will arrange CTA to rule out obstructive coronary disease.  2 hypertension-patient's blood pressure is controlled.  Continue present medical regimen.  3 hyperlipidemia-continue statin.  Rogue Pietro, MD

## 2024-04-20 ENCOUNTER — Encounter: Attending: Physical Medicine & Rehabilitation | Admitting: Registered Nurse

## 2024-04-20 ENCOUNTER — Encounter: Payer: Self-pay | Admitting: Registered Nurse

## 2024-04-20 VITALS — BP 130/77 | HR 67 | Ht 66.0 in | Wt 210.4 lb

## 2024-04-20 DIAGNOSIS — Z5181 Encounter for therapeutic drug level monitoring: Secondary | ICD-10-CM | POA: Insufficient documentation

## 2024-04-20 DIAGNOSIS — M170 Bilateral primary osteoarthritis of knee: Secondary | ICD-10-CM | POA: Diagnosis not present

## 2024-04-20 DIAGNOSIS — M546 Pain in thoracic spine: Secondary | ICD-10-CM | POA: Diagnosis not present

## 2024-04-20 DIAGNOSIS — G8929 Other chronic pain: Secondary | ICD-10-CM | POA: Diagnosis not present

## 2024-04-20 DIAGNOSIS — G894 Chronic pain syndrome: Secondary | ICD-10-CM | POA: Diagnosis not present

## 2024-04-20 DIAGNOSIS — Z79891 Long term (current) use of opiate analgesic: Secondary | ICD-10-CM | POA: Diagnosis not present

## 2024-04-20 DIAGNOSIS — M545 Low back pain, unspecified: Secondary | ICD-10-CM | POA: Insufficient documentation

## 2024-04-20 MED ORDER — HYDROCODONE-ACETAMINOPHEN 10-325 MG PO TABS: 1.0000 | ORAL_TABLET | Freq: Four times a day (QID) | ORAL | 0 refills | Status: DC | PRN

## 2024-04-20 NOTE — Progress Notes (Signed)
 Subjective:    Patient ID: Crystal Lee, female    DOB: September 07, 1933, 89 y.o.   MRN: [id number]  HPI: Crystal Lee is a 89 y.o. female who returns for follow up appointment for chronic pain and medication refill. She states her pain is located in her mid- lower backradiating into her bilateral lower extremities and bilateral knee pain.She  rates her pain 5. Her current exercise regime is walking and performing stretching exercises.  Ms. Paff Morphine  equivalent is 40.00 MME.   Oral Swab was Performed today.     Pain Inventory Average Pain 5 Pain Right Now 5 My pain is sharp, burning, tingling, and aching  In the last 24 hours, has pain interfered with the following? General activity 5 Relation with others 5 Enjoyment of life 5 What TIME of day is your pain at its worst? evening and night Sleep (in general) Poor  Pain is worse with: walking, bending, standing, and some activites Pain improves with: rest, heat/ice, and medication Relief from Meds: 2-3  Family History  Problem Relation Age of Onset   Other Mother        complications from flu   Other Father        unsure of cause   Colon cancer Neg Hx    Social History   Socioeconomic History   Marital status: Widowed    Spouse name: Not on file   Number of children: 3   Years of education: 16 years   Highest education level: Not on file  Occupational History   Occupation: Retired  Tobacco Use   Smoking status: Former    Current packs/day: 0.00    Average packs/day: 0.5 packs/day for 20.0 years (10.0 ttl pk-yrs)    Types: Cigarettes    Start date: 02/03/1956    Quit date: 02/03/1976    Years since quitting: 48.2   Smokeless tobacco: Never  Vaping Use   Vaping status: Never Used  Substance and Sexual Activity   Alcohol use: No   Drug use: No   Sexual activity: Not Currently    Birth control/protection: Post-menopausal  Other Topics Concern   Not on file  Social History Narrative   Lives at home with husband.    Right-handed.   No caffeine use.   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  (09/02/2023)   Overall Financial Resource Strain (CARDIA)    Difficulty of Paying Living Expenses: Not hard at all  Food Insecurity: No Food Insecurity (09/02/2023)   Hunger Vital Sign    Worried About Running Out of Food in the Last Year: Never true    Ran Out of Food in the Last Year: Never true  Transportation Needs: No Transportation Needs (09/02/2023)   PRAPARE - Administrator, Civil Service (Medical): No    Lack of Transportation (Non-Medical): No  Physical Activity: Inactive (09/02/2023)   Exercise Vital Sign    Days of Exercise per Week: 0 days    Minutes of Exercise per Session: 0 min  Stress: No Stress Concern Present (09/02/2023)   [name] of Occupational Health - Occupational Stress Questionnaire    Feeling of Stress : Only a little  Social Connections: Moderately Isolated (09/02/2023)   Social Connection and Isolation Panel    Frequency of Communication with Friends and Family: Twice a week    Frequency of Social Gatherings with Friends and Family: Three times a week    Attends Religious Services: More  than 4 times per year    Active Member of Clubs or Organizations: No    Attends Banker Meetings: Never    Marital Status: Widowed   Past Surgical History:  Procedure Laterality Date   81 HOUR PH STUDY N/A 06/21/2018   Procedure: 24 HOUR PH STUDY;  Surgeon: San Sandor GAILS, DO;  Location: [facility];  Service: Gastroenterology;  Laterality: N/A;  with impedance   ABDOMINAL HYSTERECTOMY  2010   ANAL RECTAL MANOMETRY N/A 09/19/2019   Procedure: ANO RECTAL MANOMETRY;  Surgeon: Shila Gustav GAILS, MD;  Location: [facility];  Service: Endoscopy;  Laterality: N/A;   APPENDECTOMY  1949   BACK SURGERY  1973   BIOPSY  06/10/2020   Procedure: BIOPSY;  Surgeon: San Sandor GAILS, DO;  Location: [facility];  Service:  Gastroenterology;;  EGD and COLON   BREAST SURGERY     CATARACT EXTRACTION Bilateral 2011   CHOLECYSTECTOMY     COLON RESECTION N/A 06/12/2020   Procedure: LAPAROSCOPIC COLON RESECTION;  Surgeon: Vernetta Berg, MD;  Location: [facility];  Service: General;  Laterality: N/A;   COLONOSCOPY WITH PROPOFOL  N/A 06/10/2020   Procedure: COLONOSCOPY WITH PROPOFOL ;  Surgeon: San Sandor GAILS, DO;  Location: [facility];  Service: Gastroenterology;  Laterality: N/A;   ESOPHAGEAL MANOMETRY N/A 06/21/2018   Procedure: ESOPHAGEAL MANOMETRY (EM);  Surgeon: San Sandor GAILS, DO;  [facility]: [facility];  Service: Gastroenterology;  Laterality: N/A;   ESOPHAGOGASTRODUODENOSCOPY (EGD) WITH PROPOFOL  N/A 06/10/2020   Procedure: ESOPHAGOGASTRODUODENOSCOPY (EGD) WITH PROPOFOL ;  Surgeon: San Sandor GAILS, DO;  Location: [facility];  Service: Gastroenterology;  Laterality: N/A;   GALLBLADDER SURGERY  2015   INCISIONAL HERNIA REPAIR N/A 09/16/2021   Procedure: OPEN INCISIONAL HERNIA REPAIR WITH MESH;  Surgeon: Vernetta Berg, MD;  Location: [facility];  Service: General;  Laterality: N/A;   JOINT REPLACEMENT     RT TOTAL HIP / RT TOTAL KNEE   MASTECTOMY  1998   BILATERAL    PH IMPEDANCE STUDY  06/21/2018   Procedure: PH IMPEDANCE STUDY;  Surgeon: San Sandor GAILS, DO;  Location: [facility];  Service: Gastroenterology;;   POLYPECTOMY  06/10/2020   Procedure: POLYPECTOMY;  Surgeon: San Sandor GAILS, DO;  [facility]: [facility];  Service: Gastroenterology;;   SKIN CANCER EXCISION  2016   RT SIDE OF NOSE   SUBMUCOSAL TATTOO INJECTION  06/10/2020   Procedure: SUBMUCOSAL TATTOO INJECTION;  Surgeon: San Sandor GAILS, DO;  [facility]: [facility];  Service: Gastroenterology;;   TONSILLECTOMY  1953   TOTAL HIP ARTHROPLASTY  2010   RIGHT   TOTAL HIP ARTHROPLASTY Left 05/09/2015   Procedure: LEFT TOTAL HIP ARTHROPLASTY ANTERIOR APPROACH;  Surgeon: Dempsey Moan, MD;  Location: [facility];  Service: Orthopedics;   Laterality: Left;   TOTAL KNEE ARTHROPLASTY  2001   TUMOR REMOVAL  2012   ABDOMINAL - NON CANCEROUS   Past Surgical History:  Procedure Laterality Date   18 HOUR PH STUDY N/A 06/21/2018   Procedure: 24 HOUR PH STUDY;  Surgeon: San Sandor GAILS, DO;  [facility];  Service: Gastroenterology;  Laterality: N/A;  with impedance   ABDOMINAL HYSTERECTOMY  2010   ANAL RECTAL MANOMETRY N/A 09/19/2019   Procedure: ANO RECTAL MANOMETRY;  Surgeon: Shila Gustav GAILS, MD;  [facility]: [facility];  Service: Endoscopy;  Laterality: N/A;   APPENDECTOMY  1949   BACK SURGERY  1973   BIOPSY  06/10/2020   Procedure: BIOPSY;  Surgeon: San Sandor GAILS, DO;  [facility];  Service: Gastroenterology;;  EGD and COLON   BREAST SURGERY     CATARACT EXTRACTION Bilateral 2011   CHOLECYSTECTOMY     COLON RESECTION N/A 06/12/2020   Procedure: LAPAROSCOPIC COLON RESECTION;  Surgeon: Vernetta Berg, MD;  [facility]: [facility];  Service: General;  Laterality: N/A;   COLONOSCOPY WITH PROPOFOL  N/A 06/10/2020   Procedure: COLONOSCOPY WITH PROPOFOL ;  Surgeon: San Sandor GAILS, DO;  [facility];  Service: Gastroenterology;  Laterality: N/A;   ESOPHAGEAL MANOMETRY N/A 06/21/2018   Procedure: ESOPHAGEAL MANOMETRY (EM);  Surgeon: San Sandor GAILS, DO;  [facility];  Service: Gastroenterology;  Laterality: N/A;   ESOPHAGOGASTRODUODENOSCOPY (EGD) WITH PROPOFOL  N/A 06/10/2020   Procedure: ESOPHAGOGASTRODUODENOSCOPY (EGD) WITH PROPOFOL ;  Surgeon: San Sandor GAILS, DO;  [facility];  Service: Gastroenterology;  Laterality: N/A;   GALLBLADDER SURGERY  2015   INCISIONAL HERNIA REPAIR N/A 09/16/2021   Procedure: OPEN INCISIONAL HERNIA REPAIR WITH MESH;  Surgeon: Vernetta Berg, MD;  [facility];  Service: General;  Laterality: N/A;   JOINT REPLACEMENT     RT TOTAL HIP / RT TOTAL KNEE   MASTECTOMY  1998   BILATERAL    PH IMPEDANCE STUDY  06/21/2018   Procedure: PH  IMPEDANCE STUDY;  Surgeon: San Sandor GAILS, DO;  Location: [facility];  Service: Gastroenterology;;   POLYPECTOMY  06/10/2020   Procedure: POLYPECTOMY;  Surgeon: San Sandor GAILS, DO;  [facility];  Service: Gastroenterology;;   SKIN CANCER EXCISION  2016   RT SIDE OF NOSE   SUBMUCOSAL TATTOO INJECTION  06/10/2020   Procedure: SUBMUCOSAL TATTOO INJECTION;  Surgeon: San Sandor GAILS, DO;  [facility];  Service: Gastroenterology;;   TONSILLECTOMY  1953   TOTAL HIP ARTHROPLASTY  2010   RIGHT   TOTAL HIP ARTHROPLASTY Left 05/09/2015   Procedure: LEFT TOTAL HIP ARTHROPLASTY ANTERIOR APPROACH;  Surgeon: Dempsey Moan, MD;  Location: [facility];  Service: Orthopedics;  Laterality: Left;   TOTAL KNEE ARTHROPLASTY  2001   TUMOR REMOVAL  2012   ABDOMINAL - NON CANCEROUS   Past Medical History:  Diagnosis Date   Acquired trigger finger of right little finger 01/15/2019   Acute blood loss anemia 06/08/2020   Acute lower GI bleeding 06/09/2020   Acute pain of right wrist 03/28/2019   Adenomatous polyp of sigmoid colon    Aftercare 08/17/2018   Arthritis    Cancer (HCC)    HX BREAST CANCER/ SKIN CANCER   Carpal tunnel syndrome of right wrist 01/15/2019   Colon cancer (HCC)    Colonic mass    Complication of anesthesia    N/V WITH MORPHINE    Constipation    Difficulty sleeping    Diverticulosis    Diverticulosis of colon without hemorrhage    Dyslipidemia 02/02/2018   Encounter for orthopedic follow-up care 06/18/2019   Fracture of superior pubic ramus (HCC) 06/16/2018   Fractured hip (HCC)    LEFT - AUG 2016   Gait abnormality 02/09/2017   Gastric polyps    GERD (gastroesophageal reflux disease)    Heartburn    History of right knee joint replacement 10/17/2017   HTN (hypertension) 06/08/2020   Hyperlipidemia    Hypertension    Incisional hernia 09/16/2021   Low back pain 02/09/2017   Lower GI bleed 06/08/2020   Lumbar post-laminectomy syndrome 07/31/2018    Malignant neoplasm of descending colon (HCC)    Melanoma (HCC)    Memory impairment 07/12/2022   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 behavioral disturbance, psychotic disturbance,  mood disturbance, or anxiety (HCC) 05/05/2021   Neuropathy    Nocturia    OA (osteoarthritis) of hip 05/09/2015   Osteopenia    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   Pain 05/05/2021   Pain in both lower extremities 08/02/2017   Pain in right hand 06/10/2017   Pain of left hand 04/20/2018   Paresthesia 02/09/2017   PONV (postoperative nausea and vomiting)    PT STATES MORPHINE  CAUSED N/V   Primary osteoarthritis of left knee 10/17/2017   Radial styloid tenosynovitis of left hand 05/16/2018   Rosacea    Sleep apnea    Stage 1 breast cancer, ER+, right (HCC) 07/04/2017   Stroke-like symptoms 03/25/2021   Tenosynovitis, wrist 03/28/2019   Trigger finger of right hand 06/10/2017   Trochanteric bursitis of left hip 12/23/2017   Urinary tract infection 04/20/2019   BP 130/77 (BP Location: Left Arm, Patient Position: Sitting, Cuff Size: Large)   Pulse 67   Ht 5' 6 (1.676 m)   Wt 210 lb 6.4 oz (95.4 kg)   SpO2 93%   BMI 33.96 kg/m   Opioid Risk Score:   Fall Risk Score:  `1  Depression screen PHQ 2/9     02/16/2024   12:00 PM 12/22/2023   11:23 AM 10/27/2023   11:54 AM 10/27/2023   11:52 AM 09/02/2023    1:16 PM 09/01/2023   11:55 AM 08/02/2023   11:33 AM  Depression screen PHQ 2/9  Decreased Interest 0 0 2 0 0 0 0  Down, Depressed, Hopeless 0 0 2 0 0 0 0  PHQ - 2 Score 0 0 4 0 0 0 0  Altered sleeping  0 2      Tired, decreased energy  0 2      Change in appetite   0      Feeling bad or failure about yourself   0 0      Trouble concentrating   0      Moving slowly or fidgety/restless  0 0      Suicidal thoughts  0 0      PHQ-9 Score  0  8       Difficult doing work/chores  Not difficult at all Somewhat difficult         Data saved with a previous flowsheet row definition     Review of  Systems  Musculoskeletal:  Positive for back pain and myalgias.       Mid to lower back pain radiating down bilateral legs to feet  All other systems reviewed and are negative.      Objective:   Physical Exam Vitals and nursing note reviewed.  Constitutional:      Appearance: Normal appearance.  Cardiovascular:     Rate and Rhythm: Normal rate and regular rhythm.     Pulses: Normal pulses.     Heart sounds: Normal heart sounds.  Pulmonary:     Effort: Pulmonary effort is normal.     Breath sounds: Normal breath sounds.  Musculoskeletal:     Comments: Normal Muscle Bulk and Muscle Testing Reveals:  Upper Extremities: Full ROM and Muscle Strength 5/5 Bilateral AC Joint Tenderness  Thoracic and Lumbar Hypersensitivity Bilateral Greater Trochanter Tenderness Lower Extremities: Full ROM and Muscle Strength 5/5 Arises from Table slowly using walker for support Narrow Based  Gait     Skin:    General: Skin is warm and dry.  Neurological:     Mental Status: She is alert  and oriented to person, place, and time.  Psychiatric:        Mood and Affect: Mood normal.        Behavior: Behavior normal.          Assessment & Plan:  1.Chronic Bilateral :Low Back Pain  Leg pain: Paresthesia: Continue HEP as Tolerated. Continue to Monitor. 04/20/2024 2. Paresthesia Harrie Radiculitis: Ms. Bear has weaned herself off the Lyrica  due to daytime drowsiness. We will continue to monitor. 04/20/2024 3. Pain of Left Wrist/ : No complaints today. S/P Carpal Tunnel Release on 06/03/2017 by Dr. Colon. Dr. Colon Following. 04/20/2024. 4. Fracture of superior pubic ramus.  Dr. Melodi Following.  Continue to monitor. 04/20/2024. 5. Bilateral Knee OA: Continue Voltaren  Gel.  Continue to monitor. Orthopedist following. 04/20/2024 6. Polyarthralgia: Continue to alternate with heat and ice therapy. Continue current medication regime. Continue to monitor. 04/20/2024. 7. Chronic Pain Syndrome:  Refilled::Hydrocodone   10/325 one tablet 4 times a day as needed for pain #120. Second script sent for the following month.  We will continue the opioid monitoring program, this consists of regular clinic visits, examinations, urine drug screen, pill counts as well as use of [place]  Controlled Substance Reporting system. A 12 month History has been reviewed on the [place]  Controlled Substance Reporting System on 04/20/2024. 8. Lumbar Compression Fracture L2 and L3:  Ms. Gerstenberger refused physical therapy. Continue with rest/ heat therapy. Continue to Monitor 04/20/2024. 9. Muscle Spasm: Continue  Flexeril  5 mg at HS. Continue to monitor. 04/20/2024 10. Right Shoulder Pain: No complaints today. Continue HEP as Tolerated. Alternate Ice and Heat Therapy. 04/20/2024  11. Bilateral  Greater Trochanter Bursitis: . Continue to Monitor. Continue HEP as Tolerated. 04/20/2024     F/U in 2 Months

## 2024-04-24 LAB — DRUG TOX MONITOR 1 W/CONF, ORAL FLD
Amphetamines: NEGATIVE ng/mL (ref <10)
Barbiturates: NEGATIVE ng/mL (ref <10)
Benzodiazepines: NEGATIVE ng/mL (ref <0.50)
Buprenorphine: NEGATIVE ng/mL (ref <0.10)
Cocaine: NEGATIVE ng/mL (ref <5.0)
Codeine: NEGATIVE ng/mL (ref <2.5)
Dihydrocodeine: 8.8 ng/mL — ABNORMAL HIGH (ref <2.5)
Fentanyl: NEGATIVE ng/mL (ref <0.10)
Heroin Metabolite: NEGATIVE ng/mL (ref <1.0)
Hydrocodone: 109.2 ng/mL — ABNORMAL HIGH (ref <2.5)
Hydromorphone: NEGATIVE ng/mL (ref <2.5)
MARIJUANA: NEGATIVE ng/mL (ref <2.5)
MDMA: NEGATIVE ng/mL (ref <10)
Meprobamate: NEGATIVE ng/mL (ref <2.5)
Methadone: NEGATIVE ng/mL (ref <5.0)
Morphine: NEGATIVE ng/mL (ref <2.5)
Nicotine Metabolite: NEGATIVE ng/mL (ref <5.0)
Norhydrocodone: 11.2 ng/mL — ABNORMAL HIGH (ref <2.5)
Noroxycodone: NEGATIVE ng/mL (ref <2.5)
Opiates: POSITIVE ng/mL — AB (ref <2.5)
Oxycodone: NEGATIVE ng/mL (ref <2.5)
Oxymorphone: NEGATIVE ng/mL (ref <2.5)
Phencyclidine: NEGATIVE ng/mL (ref <10)
Tapentadol: NEGATIVE ng/mL (ref <5.0)
Tramadol: NEGATIVE ng/mL (ref <5.0)
Zolpidem: NEGATIVE ng/mL (ref <5.0)

## 2024-04-24 LAB — DRUG TOX ALC METAB W/CON, ORAL FLD: Alcohol Metabolite: NEGATIVE ng/mL (ref <25)

## 2024-04-30 ENCOUNTER — Ambulatory Visit: Payer: Self-pay

## 2024-04-30 ENCOUNTER — Encounter: Payer: Self-pay | Admitting: Hematology & Oncology

## 2024-04-30 DIAGNOSIS — H16202 Unspecified keratoconjunctivitis, left eye: Secondary | ICD-10-CM | POA: Diagnosis not present

## 2024-04-30 NOTE — Telephone Encounter (Signed)
 FYI Only or Action Required?: Action required by provider: request for appointment and patient doesn't want to go to Urgent Care--no openings in PCP office, patient wants a call about setting up an appointment after today due to a eye doctor appointment this afternoon.  Patient was last seen in primary care on 02/06/2024 by Copland, Harlene BROCKS, MD.  Called Nurse Triage reporting Cough.  Symptoms began several weeks ago.  Interventions attempted: Rest, hydration, or home remedies.  Symptoms are: gradually worsening.  Triage Disposition: See HCP Within 4 Hours (Or PCP Triage)  Patient/caregiver understands and will follow disposition?: No, wishes to speak with PCP           Reason for Triage: Pt calling in stating she's on medication for her bronchitis and its not helping with the cough or coughing up stuff pt wanted an appt denied scheduling due to no improvement       Reason for Disposition  [1] MILD difficulty breathing (e.g., minimal/no SOB at rest, SOB with walking, pulse < 100) AND [2] still present when not coughing  Answer Assessment - Initial Assessment Questions Patient was seen on 02/06/2024 and was prescribed prednisone  but never got that filled Patient states that she had issues with her teeth that she had to get taken care of Productive cough x 4-5 weeks--yellow Patient also states that she cannot sleep at night She states shortness of breath on exertion ---worse lately  Patient is advised that the recommendation with the new shortness of breath recently it is advised that she be seen and evaluated today This RN offered an appointment with a PCP office in the surrounding region due to PCP office not having any openings today Patient states that she cannot come in today for an appointment Patient that she has an eye doctor appointment today around 2-2:30pm and tomorrow she has a dental appointment at 10:30 am She is advised Urgent Care and states that she does not  drive at night and she would have to find someone to take her if she went later this afternoon.  Then she stated that she didn't want to do too much in one day and she would probably have to get eyes dilated at the eye doctor and didn't want to go to Urgent Care today  Patient states that she wants someone from the office to call her back  set up an appointment in the office around these other appointments after today She states that if she does not answer someone can leave her a voicemial because she may be driving to her eye doctor appointment.  Patient is advised to call us  back if anything changes or with any further questions/concerns. Patient is advised that if anything worsens to go to the Emergency Room or call 911. Patient verbalized understanding.  Protocols used: Cough - Acute Productive-A-AH

## 2024-04-30 NOTE — Telephone Encounter (Signed)
 Called her back- scheduled for Thursday.  She feels ok to wait till then, more long term cough sx

## 2024-05-01 NOTE — Progress Notes (Unsigned)
 [facility] [address], [address] [place], [place] [address] [phone]  Date:  05/03/2024   Name:  Crystal Lee   DOB:  06-09-1933   MRN:  [id number]  PCP:  Watt Harlene BROCKS, MD    Chief Complaint: No chief complaint on file.   History of Present Illness:  Crystal Lee is a 89 y.o. very pleasant female patient who presents with the following:  Patient seen today with concern for cough.  She contacted us  earlier this week and we made this appointment Otherwise I saw her most recently in September -  history of hypertension, hyperlipidemia, sleep apnea, breast cancer and colon cancer presently in remission, mild dementia and likely due to Alzheimer's disease per neurology  She had a lap assisted partial colectomy January 2022 as well as chemotherapy. She had follow-up repair of incisional hernia in April 2023   Discussed the use of AI scribe software for clinical note transcription with the patient, who gave verbal consent to proceed.  History of Present Illness     Patient Active Problem List   Diagnosis Date Noted   Colon cancer (HCC)    Diverticulosis    Memory impairment 07/12/2022   Incisional hernia 09/16/2021   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 behavioral disturbance, psychotic disturbance, mood disturbance, or anxiety (HCC) 05/05/2021   Stroke-like symptoms 03/25/2021   Sleep apnea    Rosacea    PONV (postoperative nausea and vomiting)    Neuropathy    Melanoma (HCC)    Hypertension    Hyperlipidemia    Fractured hip (HCC)    Arthritis    Malignant neoplasm of descending colon (HCC)    Gastric polyps    Colonic mass    Diverticulosis of colon without hemorrhage    Adenomatous polyp of sigmoid colon    Acute lower GI bleeding 06/09/2020   Lower GI bleed 06/08/2020   Acute blood loss anemia 06/08/2020   Constipation    Urinary tract infection 04/20/2019   Acquired trigger finger of right little finger  01/15/2019   Carpal tunnel syndrome of right wrist 01/15/2019   Lumbar post-laminectomy syndrome 07/31/2018   Fracture of superior pubic ramus (HCC) 06/16/2018   Radial styloid tenosynovitis of left hand 05/16/2018   Osteopenia 04/15/2018   Dyslipidemia 02/02/2018   GERD (gastroesophageal reflux disease) 02/02/2018   Trochanteric bursitis of left hip 12/23/2017   Primary osteoarthritis of left knee 10/17/2017   Stage 1 breast cancer, ER+, right (HCC) 07/04/2017   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   Trigger finger of right hand 06/10/2017   Low back pain 02/09/2017   Gait abnormality 02/09/2017   OA (osteoarthritis) of hip 05/09/2015    Past Medical History:  Diagnosis Date   Acquired trigger finger of right little finger 01/15/2019   Acute blood loss anemia 06/08/2020   Acute lower GI bleeding 06/09/2020   Acute pain of right wrist 03/28/2019   Adenomatous polyp of sigmoid colon    Aftercare 08/17/2018   Arthritis    Cancer (HCC)    HX BREAST CANCER/ SKIN CANCER   Carpal tunnel syndrome of right wrist 01/15/2019   Colon cancer [facility])    Colonic mass    Complication of anesthesia    N/V WITH MORPHINE    Constipation    Difficulty sleeping    Diverticulosis    Diverticulosis of colon without hemorrhage    Dyslipidemia 02/02/2018   Encounter for orthopedic follow-up care 06/18/2019  Fracture of superior pubic ramus (HCC) 06/16/2018   Fractured hip (HCC)    LEFT - AUG 2016   Gait abnormality 02/09/2017   Gastric polyps    GERD (gastroesophageal reflux disease)    Heartburn    History of right knee joint replacement 10/17/2017   HTN (hypertension) 06/08/2020   Hyperlipidemia    Hypertension    Incisional hernia 09/16/2021   Low back pain 02/09/2017   Lower GI bleed 06/08/2020   Lumbar post-laminectomy syndrome 07/31/2018   Malignant neoplasm of descending colon (HCC)    Melanoma (HCC)    Memory impairment 07/12/2022   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  behavioral disturbance, psychotic disturbance, mood disturbance, or anxiety (HCC) 05/05/2021   Neuropathy    Nocturia    OA (osteoarthritis) of hip 05/09/2015   Osteopenia    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   Pain 05/05/2021   Pain in both lower extremities 08/02/2017   Pain in right hand 06/10/2017   Pain of left hand 04/20/2018   Paresthesia 02/09/2017   PONV (postoperative nausea and vomiting)    PT STATES MORPHINE  CAUSED N/V   Primary osteoarthritis of left knee 10/17/2017   Radial styloid tenosynovitis of left hand 05/16/2018   Rosacea    Sleep apnea    Stage 1 breast cancer, ER+, right (HCC) 07/04/2017   Stroke-like symptoms 03/25/2021   Tenosynovitis, wrist 03/28/2019   Trigger finger of right hand 06/10/2017   Trochanteric bursitis of left hip 12/23/2017   Urinary tract infection 04/20/2019    Past Surgical History:  Procedure Laterality Date   24 HOUR PH STUDY N/A 06/21/2018   Procedure: 24 HOUR PH STUDY;  Surgeon: San Sandor GAILS, DO;  Location: [facility];  Service: Gastroenterology;  Laterality: N/A;  with impedance   ABDOMINAL HYSTERECTOMY  2010   ANAL RECTAL MANOMETRY N/A 09/19/2019   Procedure: ANO RECTAL MANOMETRY;  Surgeon: Shila Gustav GAILS, MD;  Location: [facility];  Service: Endoscopy;  Laterality: N/A;   APPENDECTOMY  1949   BACK SURGERY  1973   BIOPSY  06/10/2020   Procedure: BIOPSY;  Surgeon: San Sandor GAILS, DO;  Location: [facility];  Service: Gastroenterology;;  EGD and COLON   BREAST SURGERY     CATARACT EXTRACTION Bilateral 2011   CHOLECYSTECTOMY     COLON RESECTION N/A 06/12/2020   Procedure: LAPAROSCOPIC COLON RESECTION;  Surgeon: Vernetta Berg, MD;  Location: [facility];  Service: General;  Laterality: N/A;   COLONOSCOPY WITH PROPOFOL  N/A 06/10/2020   Procedure: COLONOSCOPY WITH PROPOFOL ;  Surgeon: San Sandor GAILS, DO;  Location: [facility];  Service: Gastroenterology;  Laterality: N/A;   ESOPHAGEAL MANOMETRY  N/A 06/21/2018   Procedure: ESOPHAGEAL MANOMETRY (EM);  Surgeon: San Sandor GAILS, DO;  Location: [facility];  Service: Gastroenterology;  Laterality: N/A;   ESOPHAGOGASTRODUODENOSCOPY (EGD) WITH PROPOFOL  N/A 06/10/2020   Procedure: ESOPHAGOGASTRODUODENOSCOPY (EGD) WITH PROPOFOL ;  Surgeon: San Sandor GAILS, DO;  Location: [facility];  Service: Gastroenterology;  Laterality: N/A;   GALLBLADDER SURGERY  2015   INCISIONAL HERNIA REPAIR N/A 09/16/2021   Procedure: OPEN INCISIONAL HERNIA REPAIR WITH MESH;  Surgeon: Vernetta Berg, MD;  Location: [facility];  Service: General;  Laterality: N/A;   JOINT REPLACEMENT     RT TOTAL HIP / RT TOTAL KNEE   MASTECTOMY  1998   BILATERAL    PH IMPEDANCE STUDY  06/21/2018   Procedure: PH IMPEDANCE STUDY;  Surgeon: San Sandor GAILS, DO;  Location: [facility];  Service: Gastroenterology;;   POLYPECTOMY  06/10/2020   Procedure: POLYPECTOMY;  Surgeon: San Sandor GAILS, DO;  Location: [facility];  Service: Gastroenterology;;   SKIN CANCER EXCISION  2016   RT SIDE OF NOSE   SUBMUCOSAL TATTOO INJECTION  06/10/2020   Procedure: SUBMUCOSAL TATTOO INJECTION;  Surgeon: San Sandor GAILS, DO;  Location: [facility];  Service: Gastroenterology;;   TONSILLECTOMY  1953   TOTAL HIP ARTHROPLASTY  2010   RIGHT   TOTAL HIP ARTHROPLASTY Left 05/09/2015   Procedure: LEFT TOTAL HIP ARTHROPLASTY ANTERIOR APPROACH;  Surgeon: Dempsey Moan, MD;  Location: [facility];  Service: Orthopedics;  Laterality: Left;   TOTAL KNEE ARTHROPLASTY  2001   TUMOR REMOVAL  2012   ABDOMINAL - NON CANCEROUS    Social History   Tobacco Use   Smoking status: Former    Current packs/day: 0.00    Average packs/day: 0.5 packs/day for 20.0 years (10.0 ttl pk-yrs)    Types: Cigarettes    Start date: 02/03/1956    Quit date: 02/03/1976    Years since quitting: 48.2   Smokeless tobacco: Never  Vaping Use   Vaping status: Never Used  Substance Use Topics   Alcohol use: No   Drug use: No     Family History  Problem Relation Age of Onset   Other Mother        complications from flu   Other Father        unsure of cause   Colon cancer Neg Hx     Allergies  Allergen Reactions   Morphine  Nausea And Vomiting   Propoxyphene Nausea And Vomiting, Palpitations and Other (See Comments)    Darvocet Causes Sweats   Donepezil  Diarrhea and Other (See Comments)   Tape Rash and Other (See Comments)    Medication list has been reviewed and updated.  Current Outpatient Medications on File Prior to Visit  Medication Sig Dispense Refill   aspirin  EC 81 MG tablet Take 81 mg by mouth daily. Swallow whole.     atorvastatin  (LIPITOR) 20 MG tablet Take 1 tablet (20 mg total) by mouth daily. (Patient not taking: Reported on 03/09/2024) 90 tablet 3   chlorhexidine  (PERIDEX ) 0.12 % solution Use as directed 15 mLs in the mouth or throat 2 (two) times daily.     estradiol  (ESTRACE ) 0.1 MG/GM vaginal cream Place 0.5 g vaginally 2 (two) times a week. Place 0.5g nightly for two weeks then twice a week after 42.5 g 11   HYDROcodone -acetaminophen  (NORCO) 10-325 MG tablet Take 1 tablet by mouth every 6 (six) hours as needed. 120 tablet 0   hydrocortisone 2.5 % ointment Apply topically 2 (two) times daily as needed.     ipratropium (ATROVENT ) 0.06 % nasal spray Place 2 sprays into both nostrils 3 (three) times daily. 15 mL 5   ketoconazole (NIZORAL) 2 % cream Apply topically 2 (two) times daily as needed.     losartan  (COZAAR ) 25 MG tablet Take 1.5 tablets (37.5 mg total) by mouth daily. 135 tablet 3   MELATONIN PO Take 2 tablets by mouth at bedtime as needed (gummies- unsure dose).     memantine  (NAMENDA ) 10 MG tablet Take 1 tablet (10 mg total) by mouth at bedtime. 90 tablet 3   metoprolol  tartrate (LOPRESSOR ) 25 MG tablet Take 12.5 mg (half tablet) in the morning as needed for palpitations 45 tablet 3   mirabegron  ER (MYRBETRIQ ) 50 MG TB24 tablet Take 1 tablet (50 mg total) by mouth daily. 30  tablet 11   Multiple  Vitamins-Minerals (MULTIVITAMIN ADULTS) TABS Take 1 tablet by mouth daily.     ondansetron  (ZOFRAN -ODT) 4 MG disintegrating tablet Take 1 tablet (4 mg total) by mouth every 8 (eight) hours as needed for nausea or vomiting. 30 tablet 1   predniSONE  (DELTASONE ) 20 MG tablet Take 40 mg by mouth daily for 3 days, then 20 mg by mouth daily for 3 days 9 tablet 0   vitamin B-12 (CYANOCOBALAMIN ) 1000 MCG tablet Take 1,000 mcg by mouth daily.     Current Facility-Administered Medications on File Prior to Visit  Medication Dose Route Frequency Provider Last Rate Last Admin   lidocaine  (XYLOCAINE ) 2 % jelly 1 Application  1 Application Urethral Once         Review of Systems:  As per HPI- otherwise negative.   Physical Examination: There were no vitals filed for this visit. There were no vitals filed for this visit. There is no height or weight on file to calculate BMI. Ideal Body Weight:    GEN: no acute distress. HEENT: Atraumatic, Normocephalic.  Ears and Nose: No external deformity. CV: RRR, No M/G/R. No JVD. No thrill. No extra heart sounds. PULM: CTA B, no wheezes, crackles, rhonchi. No retractions. No resp. distress. No accessory muscle use. ABD: S, NT, ND, +BS. No rebound. No HSM. EXTR: No c/c/e PSYCH: Normally interactive. Conversant.    Assessment and Plan: No diagnosis found.  Assessment & Plan   Signed Harlene Schroeder, MD

## 2024-05-01 NOTE — Patient Instructions (Incomplete)
 It was good to see you again today Once you are feeling better I do recommend you get a COVID-19 booster  I will be in touch with your urine culture results Use the cefdinir  twice daily for 5 days for urine infection and possible chest infection

## 2024-05-03 ENCOUNTER — Encounter: Payer: Self-pay | Admitting: Cardiology

## 2024-05-03 ENCOUNTER — Ambulatory Visit: Attending: Cardiology | Admitting: Cardiology

## 2024-05-03 ENCOUNTER — Ambulatory Visit: Admitting: Family Medicine

## 2024-05-03 VITALS — BP 110/56 | HR 66 | Ht 66.0 in | Wt 209.0 lb

## 2024-05-03 VITALS — BP 104/64 | HR 74 | Temp 98.0°F | Ht 66.0 in | Wt 209.2 lb

## 2024-05-03 DIAGNOSIS — R072 Precordial pain: Secondary | ICD-10-CM | POA: Diagnosis not present

## 2024-05-03 DIAGNOSIS — E559 Vitamin D deficiency, unspecified: Secondary | ICD-10-CM | POA: Diagnosis not present

## 2024-05-03 DIAGNOSIS — E785 Hyperlipidemia, unspecified: Secondary | ICD-10-CM | POA: Insufficient documentation

## 2024-05-03 DIAGNOSIS — R350 Frequency of micturition: Secondary | ICD-10-CM

## 2024-05-03 DIAGNOSIS — R052 Subacute cough: Secondary | ICD-10-CM | POA: Diagnosis not present

## 2024-05-03 DIAGNOSIS — I10 Essential (primary) hypertension: Secondary | ICD-10-CM | POA: Insufficient documentation

## 2024-05-03 LAB — POCT URINALYSIS DIP (MANUAL ENTRY)
Glucose, UA: NEGATIVE mg/dL
Ketones, POC UA: NEGATIVE mg/dL
Nitrite, UA: POSITIVE — AB
Protein Ur, POC: 100 mg/dL — AB
Spec Grav, UA: 1.01 (ref 1.010–1.025)
Urobilinogen, UA: 0.2 U/dL
pH, UA: 6 (ref 5.0–8.0)

## 2024-05-03 MED ORDER — VITAMIN D3 1.25 MG (50000 UT) PO CAPS: ORAL_CAPSULE | ORAL | 1 refills | Status: AC

## 2024-05-03 MED ORDER — PHENAZOPYRIDINE HCL 200 MG PO TABS: 200.0000 mg | ORAL_TABLET | Freq: Three times a day (TID) | ORAL | 2 refills | Status: DC | PRN

## 2024-05-03 MED ORDER — CEFDINIR 300 MG PO CAPS: 300.0000 mg | ORAL_CAPSULE | Freq: Two times a day (BID) | ORAL | 0 refills | Status: DC

## 2024-05-03 NOTE — Patient Instructions (Signed)
   Testing/Procedures:    Your cardiac CT will be scheduled at   [facility] [address] [place], [place] [address] [phone]    If scheduled at [facility], please arrive 30 minutes early for check-in and test prep.  Please follow these instructions carefully (unless otherwise directed):  An IV will be required for this test and Nitroglycerin will be given.  Hold all erectile dysfunction medications at least 3 days (72 hrs) prior to test. (Ie viagra, cialis, sildenafil, tadalafil, etc)   On the Night Before the Test: Be sure to Drink plenty of water . Do not consume any caffeinated/decaffeinated beverages or chocolate 12 hours prior to your test. Do not take any antihistamines 12 hours prior to your test.   On the Day of the Test: Drink plenty of water  until 1 hour prior to the test. Do not eat any food 1 hour prior to test. You may take your regular medications prior to the test.  FEMALES- please wear underwire-free bra if available, avoid dresses & tight clothing      After the Test: Drink plenty of water . After receiving IV contrast, you may experience a mild flushed feeling. This is normal. On occasion, you may experience a mild rash up to 24 hours after the test. This is not dangerous. If this occurs, you can take Benadryl  25 mg, Zyrtec, Claritin, or Allegra and increase your fluid intake. (Patients taking Tikosyn should avoid Benadryl , and may take Zyrtec, Claritin, or Allegra) If you experience trouble breathing, this can be serious. If it is severe call 911 IMMEDIATELY. If it is mild, please call our office.  We will call to schedule your test 2-4 weeks out understanding that some insurance companies will need an authorization prior to the service being performed.   For more information and frequently asked questions, please visit our website : [email or web address]  For non-scheduling related questions, please contact the  cardiac imaging nurse navigator should you have any questions/concerns: Cardiac Imaging Nurse Navigators Direct Office Dial: [phone]   For scheduling needs, including cancellations and rescheduling, please call Brittany, [phone].   Follow-Up: At [facility], you and your health needs are our priority.  As part of our continuing mission to provide you with exceptional heart care, our providers are all part of one team.  This team includes your primary Cardiologist (physician) and Advanced Practice Providers or APPs (Physician Assistants and Nurse Practitioners) who all work together to provide you with the care you need, when you need it.  Your next appointment:   6 month(s)  Provider:   Redell Shallow, MD  in [facility]

## 2024-05-06 ENCOUNTER — Ambulatory Visit: Payer: Self-pay | Admitting: Family Medicine

## 2024-05-06 LAB — URINE CULTURE
MICRO NUMBER:: 17313097
SPECIMEN QUALITY:: ADEQUATE

## 2024-05-10 ENCOUNTER — Ambulatory Visit: Payer: Self-pay

## 2024-05-10 DIAGNOSIS — R350 Frequency of micturition: Secondary | ICD-10-CM

## 2024-05-10 NOTE — Telephone Encounter (Signed)
 FYI Only or Action Required?: Action required by provider: clinical question for provider.   Patient was last seen in primary care on 05/03/2024 by Copland, Harlene BROCKS, MD.  Called Nurse Triage reporting New Med Request.    Triage Disposition: Call PCP Within 24 Hours  Patient/caregiver understands and will follow disposition?: Yes       Message from [place] G sent at 05/10/2024  9:24 AM EST  Reason for Triage: pt said Dr.Copland prescribe her some medicine but she is still coughing and running to the bathroom she wants to know if she can get some more of it because she's ran out.   Reason for Disposition  [1] Follow-up call from patient regarding patient's clinical status AND [2] information NON-URGENT  Answer Assessment - Initial Assessment Questions 1. REASON FOR CALL or QUESTION: What is your reason for calling today? or How can I best    Patient stated she was supposed to be prescribed medicine for her cough during her last office visit 12/4. Please advise as the cough has persisted.  Protocols used: PCP Call - No Triage-A-AH

## 2024-05-11 ENCOUNTER — Other Ambulatory Visit: Payer: Self-pay | Admitting: Family Medicine

## 2024-05-11 DIAGNOSIS — R350 Frequency of micturition: Secondary | ICD-10-CM

## 2024-05-11 MED ORDER — CEFDINIR 300 MG PO CAPS: 300.0000 mg | ORAL_CAPSULE | Freq: Two times a day (BID) | ORAL | 0 refills | Status: AC

## 2024-05-11 MED ORDER — PHENAZOPYRIDINE HCL 200 MG PO TABS: 200.0000 mg | ORAL_TABLET | Freq: Three times a day (TID) | ORAL | 2 refills | Status: DC | PRN

## 2024-05-18 ENCOUNTER — Encounter (HOSPITAL_COMMUNITY): Payer: Self-pay

## 2024-05-22 ENCOUNTER — Ambulatory Visit (HOSPITAL_BASED_OUTPATIENT_CLINIC_OR_DEPARTMENT_OTHER)
Admission: RE | Admit: 2024-05-22 | Discharge: 2024-05-22 | Disposition: A | Discharge status: Home / Self Care | Source: Ambulatory Visit | Attending: Cardiology | Admitting: Cardiology

## 2024-05-22 DIAGNOSIS — R072 Precordial pain: Secondary | ICD-10-CM | POA: Diagnosis present

## 2024-05-22 MED ORDER — NITROGLYCERIN 0.4 MG SL SUBL
0.8000 mg | SUBLINGUAL_TABLET | Freq: Once | SUBLINGUAL | Status: AC
  Administered 2024-05-22: 0.8 mg via SUBLINGUAL

## 2024-05-22 MED ORDER — IOHEXOL 350 MG/ML SOLN
100.0000 mL | Freq: Once | INTRAVENOUS | Status: AC | PRN
  Administered 2024-05-22: 95 mL via INTRAVENOUS

## 2024-05-23 ENCOUNTER — Ambulatory Visit: Payer: Self-pay | Admitting: Cardiology

## 2024-05-23 ENCOUNTER — Inpatient Hospital Stay (INDEPENDENT_AMBULATORY_CARE_PROVIDER_SITE_OTHER)
Admission: RE | Admit: 2024-05-23 | Discharge: 2024-05-23 | Disposition: A | Payer: Self-pay | Source: Ambulatory Visit | Attending: Cardiology | Admitting: Cardiology

## 2024-05-23 ENCOUNTER — Other Ambulatory Visit: Payer: Self-pay | Admitting: Cardiology

## 2024-05-23 DIAGNOSIS — R931 Abnormal findings on diagnostic imaging of heart and coronary circulation: Secondary | ICD-10-CM

## 2024-05-23 DIAGNOSIS — E785 Hyperlipidemia, unspecified: Secondary | ICD-10-CM

## 2024-05-23 NOTE — Progress Notes (Signed)
 FFR Order

## 2024-05-25 ENCOUNTER — Ambulatory Visit: Payer: Self-pay | Admitting: Cardiology

## 2024-05-28 ENCOUNTER — Telehealth: Payer: Self-pay

## 2024-05-28 MED ORDER — ATORVASTATIN CALCIUM 40 MG PO TABS: 40.0000 mg | ORAL_TABLET | Freq: Every day | ORAL | 3 refills | Status: AC

## 2024-05-28 NOTE — Telephone Encounter (Signed)
 Pt viewed FFR results on My Chart per Dr. Karry note. Routed to PCP

## 2024-06-04 NOTE — Progress Notes (Addendum)
 "   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 behavioral disturbance likely due to Alzheimer's disease   Crystal Lee is a very pleasant 89 y.o. RH female with a history of hypertension, hyperlipidemia, sleep apnea, iron deficiency anemia, stage IIIb colon cancer recently off Xeloda , on Zometa  yearly, history of stage Ia breast cancer, anxiety, depression, and mild dementia likely due to  Alzheimer's disease seen today in follow up for memory loss. Patient is currently on memantine  10 mg nightly by choice, tolerating well.  Memory is quite stable, with MMSE today of 30/30.  This patient is a here alone. Previous records as well as any outside records available were reviewed prior to todays visit. Patient was last seen on 12/05/2023 . Patient is able to participate on ADLs and continues to drive without difficulties. Mood is anxious as prior    Continue memantine  10 mg nightly  by choice, side effects discussed Recommend good control of cardiovascular risk factors.   Continue to control mood as per PCP Follow up in 6 months   Discussed the use of AI scribe software for clinical note transcription with the patient, who gave verbal consent to proceed.  History of Present Illness Crystal Lee is an 89 year old female who presents for a neurology follow-up for chronic pain and memory concerns.  The patient reports that, in her opinion, her memory is about the same as at the last visit in July. She engages in cognitive activities such as crossword puzzles, memorizing capitals, reading, and watching documentaries. She manages her daily routines independently and notes a general slowing down, which she attributes to physical limitations. The patient reports no confusion or disorientation in her daily life.  The patient reports chronic pain and has a history of left radiculitis, compression fractures at L2 and L3, arthritis, and muscle spasms. Physical therapy was attempted but increased pain; the patient currently uses rest and  heat therapy. She uses mobility aids, including a cane, walker, and rollator, and reports that she has not found topical creams effective for pain relief.  She experiences chronic headaches localized to the left frontal area, which have persisted since a head injury years ago, with no reported worsening.  She has ongoing issues with her left eye, problematic since a COVID shot five years ago. Despite using doxycycline drops, there is no improvement in the eye redness. An appointment with an ophthalmologist is scheduled soon.  She copes with chronic resting tremors by sitting on her hands when they are bothersome, prefers no medications.  She has some difficulty sleeping and has tried melatonin without success, unable to recall the exact dosage. No nightmares or significant sleep disturbances beyond difficulty falling asleep are reported.  She has an overactive bladder with frequent urinary tract infections, having urinary frequency and pain, particularly when sitting or during car rides. Previous percutaneous tibial nerve stimulation was only temporarily effective.  Chronic constipation is managed by avoiding foods like chocolate that exacerbate the condition. She manages her bowel habits independently.  She lives alone, manages her own medications, and her daughter assists with taxes. She continues to drive without issues and maintains her daily activities.          Initial visit 05/05/2021 the patient is seen in neurologic consultation at the request of Copland, Harlene BROCKS, MD for the evaluation of memory.  The patient is here alone.  This is a 89 y.o. year old RH  female who presents today after hospitalization for strokelike symptoms.  It was also noted that she may have  had cognitive issues during the presentation, at which time a MoCA was performed, yielding a result of 18/30.  Of note, the patient denies having any cognitive issues, treated in some of short-term memory difficulties to old  age .  During the hospitalization, she was diagnosed with small vessel disease, evidence of amyloid angiopathy per MRI, central nervous system degenerative disorder, and mild to moderate Alzheimer's disease per review of the notes.  She was to follow-up at [facility], but she wanted to seek additional neurologic opinion with [facility] neurology instead. For her memory, she denies repeating herself, or being disoriented when walking into the room.  She denies leaving objects in unusual places.  She ambulates with her walker for safety.  She had prior falls, fracturing her pelvis, and living with pain over the last 89 years old older .  She only had 1 minor head injury about 17 years ago while cleaning a window, and hitting her head while she was moving forward.  She did not lose any consciousness at that time.  She denies being disoriented when walking, or wandering behavior.  She drives any distances, without GPS I do not need it .  She stopped driving long distances however when she came from [place]  in 2018 to retire, and her vision was not as good.  Since her husband's death, she lives alone, and has home health visiting her.  Her mood is good, she denies depression or irritability.  She enjoys painting, and coloring.  She is to be able to do crossword puzzles but she stopped due to eye problems .  She sleeps well, denies vivid dreams or sleepwalking.  She denies hallucinations or paranoia.  There are no hygiene concerns.  She is independent of bathing and dressing.  Her medications are in a pillbox.  Her finances are done by her, only taxes are done by one of her children.  Her appetite is good, denies any trouble swallowing.  She cooks without forgetting any common recipes, or burning the stove.     As for her strokelike symptoms, the patient reports that she had similar episodes in the year 2000, when she noticed mild facial drooping and numbness, which quickly resolved, without recurrence.  On the day of  evaluation in October 2022, she was at her doctor's office, when she had 20 second  episode (she denies this was 20-minute episodes as in chart).  Her PCP told her to go downstairs today urgent care, to check that out .  From the urgent care, the transported her to the ER, for further evaluation, suspecting that the patient was having a TIA.  Per chart notes, she may have had possible right facial droop and right-sided weakness, although she states that for the last 20 years, she has slightly decreased strength on the right side of her body.  The ER neurologist saw her, and work-up was initiated.  MRI of the brain did not show acute CVA, but did show amyloid angiopathy.  EEG was without seizures.  2D echo was unremarkable.  Neurology recommended aspirin  81 mg on discharge, and no DAPT was going to be given, because of concern of amyloid angiopathy.  Lipitor 20 was added on discharge.  No further episodes of strokelike symptoms since then.  Of note, during that hospitalization, she also was found to have UTI I know I had it, I have regular UTIs.  EKG shows sinus tachycardia.  COVID was negative.  Neurology recommended that she had a better control of her  blood pressure to lays down on 140 systolic.  No stroke was diagnosed.  She denies today any headaches, double vision, dizziness, focal numbness or tingling, unilateral weakness other than the chronic mild right upper extremity as mentioned above, tremors or anosmia.  No history of seizures.  She denies urine incontinence, but occasionally wears a pad when going out.  She denies any retention.  She has a long history of constipation.  She denies any diarrhea.  She denies sleep apnea, although in chart it shows in certain areas that she may have it.  She denies any alcohol or tobacco.  Family history negative for dementia.  The patient is originally from [place].     MRI brain  1. No evidence of acute intracranial abnormality, including infarct. 2. Numerous  tiny scattered foci of susceptibility artifact throughout the cortex bilaterally, greatest in the parieto-occipital lobes. While nonspecific, findings could relate to prior microhemorrhages from amyloid angiopathy or prior microemboli. The distribution is atypical for hypertensive hemorrhages.     EMG/NCS 2019 bilateral lower extremity paresthesia, bilateral leg pain, EMG nerve conduction study then showed no large fiber peripheral neuropathy   PREVIOUS MEDICATIONS:  Donepezil  (GI side effects)      06/06/2024   12:00 PM 08/31/2022    2:13 PM  MMSE - Mini Mental State Exam  Not completed:  Unable to complete  Orientation to time 5   Orientation to Place 5   Registration 3   Attention/ Calculation 5   Recall 3   Language- name 2 objects 2   Language- repeat 1   Language- follow 3 step command 3   Language- read & follow direction 1   Write a sentence 1   Copy design 1   Total score 30       05/05/2021   12:00 PM  Montreal Cognitive Assessment   Visuospatial/ Executive (0/5) 2  Naming (0/3) 3  Attention: Read list of digits (0/2) 2  Attention: Read list of letters (0/1) 1  Attention: Serial 7 subtraction starting at 100 (0/3) 1  Language: Repeat phrase (0/2) 1  Language : Fluency (0/1) 0  Abstraction (0/2) 2  Delayed Recall (0/5) 0  Orientation (0/6) 6  Total 18      Objective:    Neurological Exam:    VITALS:   Vitals:   06/06/24 1056  BP: 130/78  Pulse: (!) 114  SpO2: 95%  Weight: 209 lb 12.8 oz (95.2 kg)  Height: 5' 6 (1.676 m)    GEN:  The patient appears stated age and is in NAD. HEENT:  Normocephalic, atraumatic.   Neurological examination:  General: NAD, well-groomed, appears stated age. Orientation: The patient is alert. Oriented to person, place and date Cranial nerves: There is good facial symmetry.anxious appearing.  The speech is fluent and clear. No aphasia or dysarthria. Fund of knowledge is appropriate. Recent and remote memory are impaired.  Attention and concentration are reduced. Able to name objects and repeat phrases.  Hearing is intact to conversational tone. Delayed recall 3/3  Sensation: Sensation is intact to light touch throughout Motor: Strength is at least antigravity x4. DTR's 2/4 in UE/LE     Movement examination:  Tone: There is normal tone in the UE/LE Abnormal movements:  no tremor.  No myoclonus.  No asterixis.   Coordination:  There is no decremation with RAM's. Normal finger to nose  Gait and Station: The patient has some difficulty arising out of a deep-seated chair without the use of the  hands. The patient's stride length is good.  Gait is cautious and narrow.  Flexes forward.  Uses a walker for stability   Thank you for allowing us  the opportunity to participate in the care of this nice patient. Please do not hesitate to contact us  for any questions or concerns.   Total time spent on today's visit was 24 minutes dedicated to this patient today, preparing to see patient, examining the patient, ordering tests and/or medications and counseling the patient, documenting clinical information in the EHR or other health record, independently interpreting results and communicating results to the patient/family, discussing treatment and goals, answering patient's questions and coordinating care.  Cc:  Copland, Harlene BROCKS, MD  Camie Sevin 06/06/2024 12:34 PM      "

## 2024-06-06 ENCOUNTER — Other Ambulatory Visit (HOSPITAL_COMMUNITY)
Admission: RE | Admit: 2024-06-06 | Discharge: 2024-06-06 | Disposition: A | Discharge status: Home / Self Care | Source: Ambulatory Visit | Attending: Obstetrics and Gynecology | Admitting: Obstetrics and Gynecology

## 2024-06-06 ENCOUNTER — Ambulatory Visit (INDEPENDENT_AMBULATORY_CARE_PROVIDER_SITE_OTHER): Admitting: Physician Assistant

## 2024-06-06 ENCOUNTER — Ambulatory Visit

## 2024-06-06 ENCOUNTER — Encounter: Payer: Self-pay | Admitting: Physician Assistant

## 2024-06-06 VITALS — BP 130/78 | HR 114 | Ht 66.0 in | Wt 209.8 lb

## 2024-06-06 VITALS — BP 148/71 | HR 87

## 2024-06-06 DIAGNOSIS — R350 Frequency of micturition: Secondary | ICD-10-CM

## 2024-06-06 DIAGNOSIS — R82998 Other abnormal findings in urine: Secondary | ICD-10-CM | POA: Diagnosis present

## 2024-06-06 DIAGNOSIS — F03A0 Unspecified dementia, mild, without behavioral disturbance, psychotic disturbance, mood disturbance, and anxiety: Secondary | ICD-10-CM | POA: Diagnosis not present

## 2024-06-06 DIAGNOSIS — R319 Hematuria, unspecified: Secondary | ICD-10-CM

## 2024-06-06 LAB — POCT URINALYSIS DIP (CLINITEK)
Bilirubin, UA: NEGATIVE
Glucose, UA: NEGATIVE mg/dL
Ketones, POC UA: NEGATIVE mg/dL
Nitrite, UA: POSITIVE — AB
POC PROTEIN,UA: 100 — AB
Spec Grav, UA: 1.02
Urobilinogen, UA: 0.2 U/dL
pH, UA: 5.5

## 2024-06-06 LAB — URINALYSIS, COMPLETE (UACMP) WITH MICROSCOPIC
Bilirubin Urine: NEGATIVE
Glucose, UA: 100 mg/dL — AB
Ketones, ur: NEGATIVE mg/dL
Nitrite: POSITIVE — AB
Protein, ur: 30 mg/dL — AB
Specific Gravity, Urine: 1.025 (ref 1.005–1.030)
WBC, UA: >50 WBC/hpf (ref 0–5)
pH: 6 (ref 5.0–8.0)

## 2024-06-06 NOTE — Patient Instructions (Signed)
 Thank you for entrusting us  with your care. Please keep all future appointments and if you have any questions or concerns please feel free to contact our office at [phone].

## 2024-06-06 NOTE — Progress Notes (Signed)
 Crystal Lee arrived today with dysuria and urinary frequency. Patient is notexperiencing fever, unstable vitals and/or one-sided back flank pain. Patient has not had had a recent hospitalization due to UTI.  Last visit in the office was 03/09/2024.  Per protocol:   The most recent Urinalysis completed on 12/.08/2023  and was notnormal.  Last Creatinine level  Lab Results  Component Value Date   CREATININE 0.57 02/16/2024    An urine specimen was collected and POCT urinalysis completed. [] A cath specimen was collected due to patient's current condition, symptoms or post-procedural state.  Total urine output by catheter is  Output by Drain (mL) 06/04/24 0701 - 06/04/24 1900 06/04/24 1901 - 06/05/24 0700 06/05/24 0701 - 06/05/24 1900 06/05/24 1901 - 06/06/24 0700 06/06/24 0701 - 06/06/24 1649  Patient has no LDAs of requested type attached.    SABRA    POCT Urine results is not normal.  Urine micro was sent per protocol for abnormal urinalysis.  Urine culture was sent per protocol for abnormal urinalysis.     [x] Pt was notified of positive urine results and plan for additional urine testing. We will contact you within the next 3-4 days with these results.  [] No Prescription was sent to your pharmacy.  The additional testing will indicate if a prescription is needed.   [x] Patient was notified of abnormal urine results. The following prescription is sent to your preferred pharmacy.  []  Macrobid  100mg  #10 1 tablet by mouth twice daily with food for 5 days      []  Bactrim  DS 800-160mg  #6 1 tablet by mouth twice daily for 3 days        []  Due to your current medication allergies, an alternate prescription was discussed with your provider and will be prescribed and sent to your pharmacy.  [] You can take over the counter AZO two tablets up to three times a day for two days.  Take AZO tablets with a full glass of water . AZO will turn your urine orange, this is normal.   [] The patient was notified of negative  urine results.  If symptoms persist, you may take over the counter AZO two tablets up to three times a day for two days.  AZO will turn your urine orange, this is normal.  Contact the office back to schedule an appointment if your symptoms persist or worsen or you develop additional symptoms.       CC'd note to patient's provider.

## 2024-06-06 NOTE — Patient Instructions (Signed)
 It was a pleasure to see you today at our office.   Recommendations:   6 months  Continue memantine  10 mg at night     RECOMMENDATIONS FOR ALL PATIENTS WITH MEMORY PROBLEMS: 1. Continue to exercise (Recommend 30 minutes of walking everyday, or 3 hours every week) 2. Increase social interactions - continue going to [place] and enjoy social gatherings with friends and family 3. Eat healthy, avoid fried foods and eat more fruits and vegetables 4. Maintain adequate blood pressure, blood sugar, and blood cholesterol level. Reducing the risk of stroke and cardiovascular disease also helps promoting better memory. 5. Avoid stressful situations. Live a simple life and avoid aggravations. Organize your time and prepare for the next day in anticipation. 6. Sleep well, avoid any interruptions of sleep and avoid any distractions in the bedroom that may interfere with adequate sleep quality 7. Avoid sugar, avoid sweets as there is a strong link between excessive sugar intake, diabetes, and cognitive impairment We discussed the Mediterranean diet, which has been shown to help patients reduce the risk of progressive memory disorders and reduces cardiovascular risk. This includes eating fish, eat fruits and green leafy vegetables, nuts like almonds and hazelnuts, walnuts, and also use olive oil. Avoid fast foods and fried foods as much as possible. Avoid sweets and sugar as sugar use has been linked to worsening of memory function.  There is always a concern of gradual progression of memory problems. If this is the case, then we may need to adjust level of care according to patient needs. Support, both to the patient and caregiver, should then be put into place.    The [employer] is here all day, every day for people facing Alzheimer's disease through our free 24/7 Helpline: [phone]. The Helpline provides reliable information and support to all those who need assistance, such as individuals  living with memory loss, Alzheimer's or other dementia, caregivers, health care professionals and the public.  Our highly trained and knowledgeable staff can help you with: Understanding memory loss, dementia and Alzheimer's  Medications and other treatment options  General information about aging and brain health  Skills to provide quality care and to find the best care from professionals  Legal, financial and living-arrangement decisions Our Helpline also features: Confidential care consultation provided by master's level clinicians who can help with decision-making support, crisis assistance and education on issues families face every day  Help in a caller's preferred language using our translation service that features more than 200 languages and dialects  Referrals to local community programs, services and ongoing support     FALL PRECAUTIONS: Be cautious when walking. Scan the area for obstacles that may increase the risk of trips and falls. When getting up in the mornings, sit up at the edge of the bed for a few minutes before getting out of bed. Consider elevating the bed at the head end to avoid drop of blood pressure when getting up. Walk always in a well-lit room (use night lights in the walls). Avoid area rugs or power cords from appliances in the middle of the walkways. Use a walker or a cane if necessary and consider physical therapy for balance exercise. Get your eyesight checked regularly.  FINANCIAL OVERSIGHT: Supervision, especially oversight when making financial decisions or transactions is also recommended.  HOME SAFETY: Consider the safety of the kitchen when operating appliances like stoves, microwave oven, and blender. Consider having supervision and share cooking responsibilities until no longer able to participate in those.  Accidents with firearms and other hazards in the house should be identified and addressed as well.   ABILITY TO BE LEFT ALONE: If patient is unable  to contact 911 operator, consider using LifeLine, or when the need is there, arrange for someone to stay with patients. Smoking is a fire hazard, consider supervision or cessation. Risk of wandering should be assessed by caregiver and if detected at any point, supervision and safe proof recommendations should be instituted.  MEDICATION SUPERVISION: Inability to self-administer medication needs to be constantly addressed. Implement a mechanism to ensure safe administration of the medications.   DRIVING: Regarding driving, in patients with progressive memory problems, driving will be impaired. We advise to have someone else do the driving if trouble finding directions or if minor accidents are reported. Independent driving assessment is available to determine safety of driving.   If you are interested in the driving assessment, you can contact the following:  [employer] in [place] [phone]  [facility] [phone]  [facility] [phone] or [phone]      Mediterranean Diet A Mediterranean diet refers to food and lifestyle choices that are based on the traditions of countries located on the [employer]. This way of eating has been shown to help prevent certain conditions and improve outcomes for people who have chronic diseases, like kidney disease and heart disease. What are tips for following this plan? Lifestyle  Cook and eat meals together with your family, when possible. Drink enough fluid to keep your urine clear or pale yellow. Be physically active every day. This includes: Aerobic exercise like running or swimming. Leisure activities like gardening, walking, or housework. Get 7-8 hours of sleep each night. If recommended by your health care provider, drink red wine in moderation. This means 1 glass a day for nonpregnant women and 2 glasses a day for men. A glass of wine equals 5 oz (150  mL). Reading food labels  Check the serving size of packaged foods. For foods such as rice and pasta, the serving size refers to the amount of cooked product, not dry. Check the total fat in packaged foods. Avoid foods that have saturated fat or trans fats. Check the ingredients list for added sugars, such as corn syrup. Shopping  At the grocery store, buy most of your food from the areas near the walls of the store. This includes: Fresh fruits and vegetables (produce). Grains, beans, nuts, and seeds. Some of these may be available in unpackaged forms or large amounts (in bulk). Fresh seafood. Poultry and eggs. Low-fat dairy products. Buy whole ingredients instead of prepackaged foods. Buy fresh fruits and vegetables in-season from local farmers markets. Buy frozen fruits and vegetables in resealable bags. If you do not have access to quality fresh seafood, buy precooked frozen shrimp or canned fish, such as tuna, salmon, or sardines. Buy small amounts of raw or cooked vegetables, salads, or olives from the deli or salad bar at your store. Stock your pantry so you always have certain foods on hand, such as olive oil, canned tuna, canned tomatoes, rice, pasta, and beans. Cooking  Cook foods with extra-virgin olive oil instead of using butter or other vegetable oils. Have meat as a side dish, and have vegetables or grains as your main dish. This means having meat in small portions or adding small amounts of meat to foods like pasta or stew. Use beans or vegetables instead of meat in common dishes like chili or  lasagna. Experiment with different cooking methods. Try roasting or broiling vegetables instead of steaming or sauteing them. Add frozen vegetables to soups, stews, pasta, or rice. Add nuts or seeds for added healthy fat at each meal. You can add these to yogurt, salads, or vegetable dishes. Marinate fish or vegetables using olive oil, lemon juice, garlic, and fresh herbs. Meal  planning  Plan to eat 1 vegetarian meal one day each week. Try to work up to 2 vegetarian meals, if possible. Eat seafood 2 or more times a week. Have healthy snacks readily available, such as: Vegetable sticks with hummus. Greek yogurt. Fruit and nut trail mix. Eat balanced meals throughout the week. This includes: Fruit: 2-3 servings a day Vegetables: 4-5 servings a day Low-fat dairy: 2 servings a day Fish, poultry, or lean meat: 1 serving a day Beans and legumes: 2 or more servings a week Nuts and seeds: 1-2 servings a day Whole grains: 6-8 servings a day Extra-virgin olive oil: 3-4 servings a day Limit red meat and sweets to only a few servings a month What are my food choices? Mediterranean diet Recommended Grains: Whole-grain pasta. Brown rice. Bulgar wheat. Polenta. Couscous. Whole-wheat bread. [place] Madeira. Vegetables: Artichokes. Beets. Broccoli. Cabbage. Carrots. Eggplant. Green beans. Chard. Kale. Spinach. Onions. Leeks. Peas. Squash. Tomatoes. Peppers. Radishes. Fruits: Apples. Apricots. Avocado. Berries. Bananas. Cherries. Dates. Figs. Grapes. Lemons. Melon. Oranges. Peaches. Plums. Pomegranate. Meats and other protein foods: Beans. Almonds. Sunflower seeds. Pine nuts. Peanuts. Cod. Salmon. Scallops. Shrimp. Tuna. Tilapia. Clams. Oysters. Eggs. Dairy: Low-fat milk. Cheese. Greek yogurt. Beverages: Water . Red wine. Herbal tea. Fats and oils: Extra virgin olive oil. Avocado oil. Grape seed oil. Sweets and desserts: [place] yogurt with honey. Baked apples. Poached pears. Trail mix. Seasoning and other foods: Basil. Cilantro. Coriander. Cumin. Mint. Parsley. Sage. Rosemary. Tarragon. Garlic. Oregano. Thyme. Pepper. Balsalmic vinegar. Tahini. Hummus. Tomato sauce. Olives. Mushrooms. Limit these Grains: Prepackaged pasta or rice dishes. Prepackaged cereal with added sugar. Vegetables: Deep fried potatoes (french fries). Fruits: Fruit canned in syrup. Meats and other protein  foods: Beef. Pork. Lamb. Poultry with skin. Hot dogs. [place]. Dairy: Ice cream. Sour cream. Whole milk. Beverages: Juice. Sugar-sweetened soft drinks. Beer. Liquor and spirits. Fats and oils: Butter. Canola oil. Vegetable oil. Beef fat (tallow). Lard. Sweets and desserts: Cookies. Cakes. Pies. Candy. Seasoning and other foods: Mayonnaise. Premade sauces and marinades. The items listed may not be a complete list. Talk with your dietitian about what dietary choices are right for you. Summary The Mediterranean diet includes both food and lifestyle choices. Eat a variety of fresh fruits and vegetables, beans, nuts, seeds, and whole grains. Limit the amount of red meat and sweets that you eat. Talk with your health care provider about whether it is safe for you to drink red wine in moderation. This means 1 glass a day for nonpregnant women and 2 glasses a day for men. A glass of wine equals 5 oz (150 mL). This information is not intended to replace advice given to you by your health care provider. Make sure you discuss any questions you have with your health care provider. Document Released: 01/08/2016 Document Revised: 02/10/2016 Document Reviewed: 01/08/2016 Elsevier Interactive Patient Education  2017 [employer].  Your provider has requested that you have labwork completed today. Please go to [facility] ([id number]) on the second floor of this building before leaving the office today. You do not need to check in. If you are not called within 15 minutes please check with  the front desk.

## 2024-06-07 ENCOUNTER — Ambulatory Visit: Payer: Self-pay | Admitting: Obstetrics and Gynecology

## 2024-06-07 DIAGNOSIS — N390 Urinary tract infection, site not specified: Secondary | ICD-10-CM

## 2024-06-08 LAB — URINE CULTURE: Culture: >=100000 — AB

## 2024-06-08 MED ORDER — CIPROFLOXACIN HCL 500 MG PO TABS: 500.0000 mg | ORAL_TABLET | Freq: Two times a day (BID) | ORAL | 0 refills | Status: AC

## 2024-06-15 ENCOUNTER — Other Ambulatory Visit (HOSPITAL_COMMUNITY)
Admission: RE | Admit: 2024-06-15 | Discharge: 2024-06-15 | Disposition: A | Discharge status: Home / Self Care | Payer: Self-pay | Source: Other Acute Inpatient Hospital | Attending: Obstetrics and Gynecology | Admitting: Obstetrics and Gynecology

## 2024-06-15 ENCOUNTER — Ambulatory Visit

## 2024-06-15 VITALS — BP 135/79 | HR 75

## 2024-06-15 DIAGNOSIS — R350 Frequency of micturition: Secondary | ICD-10-CM

## 2024-06-15 DIAGNOSIS — R82998 Other abnormal findings in urine: Secondary | ICD-10-CM

## 2024-06-15 LAB — POCT URINALYSIS DIP (CLINITEK)
Bilirubin, UA: NEGATIVE
Blood, UA: NEGATIVE
Glucose, UA: NEGATIVE mg/dL
Ketones, POC UA: NEGATIVE mg/dL
Nitrite, UA: NEGATIVE
POC PROTEIN,UA: NEGATIVE
Spec Grav, UA: 1.015
Urobilinogen, UA: 0.2 U/dL
pH, UA: 6

## 2024-06-15 NOTE — Progress Notes (Signed)
 Crystal Lee arrived today with dysuria and lower abdominal pain. Patient is notexperiencing fever, unstable vitals and/or one-sided back flank pain. Patient has not had had a recent hospitalization due to UTI.  Last visit in the office was 06/06/2024.  Per protocol:   The most recent Urinalysis completed on 06-06-2024 and was notnormal.  Last Creatinine level  Lab Results  Component Value Date   CREATININE 0.57 02/16/2024    An urine specimen was collected and POCT urinalysis completed. [] A cath specimen was collected due to patient's current condition, symptoms or post-procedural state.  Total urine output by catheter is  Output by Drain (mL) 06/13/24 0701 - 06/13/24 1900 06/13/24 1901 - 06/14/24 0700 06/14/24 0701 - 06/14/24 1900 06/14/24 1901 - 06/15/24 0700 06/15/24 0701 - 06/15/24 1109  Patient has no LDAs of requested type attached.    SABRA    POCT Urine results is not normal.  Urine micro was not sent per protocol for abnormal urinalysis.  Urine culture was sent per protocol for abnormal urinalysis.     [x] Pt was notified of positive urine results and plan for additional urine testing. We will contact you within the next 3-4 days with these results.  [] No Prescription was sent to your pharmacy.  The additional testing will indicate if a prescription is needed.   [] Patient was notified of abnormal urine results. The following prescription is sent to your preferred pharmacy.  []  Macrobid  100mg  #10 1 tablet by mouth twice daily with food for 5 days      []  Bactrim  DS 800-160mg  #6 1 tablet by mouth twice daily for 3 days        []  Due to your current medication allergies, an alternate prescription was discussed with your provider and will be prescribed and sent to your pharmacy.  [x] You can take over the counter AZO two tablets up to three times a day for two days.  Take AZO tablets with a full glass of water . AZO will turn your urine orange, this is normal.   [] The patient was notified of  negative urine results.  If symptoms persist, you may take over the counter AZO two tablets up to three times a day for two days.  AZO will turn your urine orange, this is normal.  Contact the office back to schedule an appointment if your symptoms persist or worsen or you develop additional symptoms.       CC'd note to patient's provider.

## 2024-06-15 NOTE — Patient Instructions (Addendum)
 We have sent your urine out for additional testing.   It was a pleasure to see you today!  Thank you for trusting me with your care!

## 2024-06-16 LAB — URINE CULTURE: Culture: NO GROWTH

## 2024-06-18 ENCOUNTER — Ambulatory Visit: Payer: Self-pay | Admitting: Obstetrics and Gynecology

## 2024-06-19 ENCOUNTER — Encounter: Payer: Self-pay | Admitting: Registered Nurse

## 2024-06-19 ENCOUNTER — Encounter: Attending: Physical Medicine & Rehabilitation | Admitting: Registered Nurse

## 2024-06-19 VITALS — BP 127/76 | HR 74 | Ht 66.0 in | Wt 211.6 lb

## 2024-06-19 DIAGNOSIS — M545 Low back pain, unspecified: Secondary | ICD-10-CM | POA: Insufficient documentation

## 2024-06-19 DIAGNOSIS — Z5181 Encounter for therapeutic drug level monitoring: Secondary | ICD-10-CM | POA: Diagnosis not present

## 2024-06-19 DIAGNOSIS — M2550 Pain in unspecified joint: Secondary | ICD-10-CM | POA: Diagnosis not present

## 2024-06-19 DIAGNOSIS — G8929 Other chronic pain: Secondary | ICD-10-CM | POA: Insufficient documentation

## 2024-06-19 DIAGNOSIS — Z79891 Long term (current) use of opiate analgesic: Secondary | ICD-10-CM | POA: Insufficient documentation

## 2024-06-19 DIAGNOSIS — M961 Postlaminectomy syndrome, not elsewhere classified: Secondary | ICD-10-CM | POA: Diagnosis not present

## 2024-06-19 DIAGNOSIS — G894 Chronic pain syndrome: Secondary | ICD-10-CM | POA: Insufficient documentation

## 2024-06-19 DIAGNOSIS — M170 Bilateral primary osteoarthritis of knee: Secondary | ICD-10-CM | POA: Insufficient documentation

## 2024-06-19 DIAGNOSIS — M7062 Trochanteric bursitis, left hip: Secondary | ICD-10-CM | POA: Diagnosis not present

## 2024-06-19 DIAGNOSIS — M546 Pain in thoracic spine: Secondary | ICD-10-CM | POA: Insufficient documentation

## 2024-06-19 DIAGNOSIS — M5416 Radiculopathy, lumbar region: Secondary | ICD-10-CM | POA: Insufficient documentation

## 2024-06-19 DIAGNOSIS — M7061 Trochanteric bursitis, right hip: Secondary | ICD-10-CM | POA: Diagnosis not present

## 2024-06-19 DIAGNOSIS — M62838 Other muscle spasm: Secondary | ICD-10-CM | POA: Diagnosis not present

## 2024-06-19 MED ORDER — HYDROCODONE-ACETAMINOPHEN 10-325 MG PO TABS: 1.0000 | ORAL_TABLET | Freq: Four times a day (QID) | ORAL | 0 refills | Status: AC | PRN

## 2024-06-19 MED ORDER — HYDROCODONE-ACETAMINOPHEN 10-325 MG PO TABS: 1.0000 | ORAL_TABLET | Freq: Four times a day (QID) | ORAL | 0 refills | Status: DC | PRN

## 2024-06-19 NOTE — Progress Notes (Signed)
 "  Subjective:    Patient ID: Crystal Lee, female    DOB: 1934-04-20, 89 y.o.   MRN: [id number]  HPI: Crystal Lee is a 89 y.o. female who returns for follow up appointment for chronic pain and medication refill. She states her pain is located in her mid- lower back radiating into her bilateral lower extremities and bilateral feet with numbness and tingling. She also reports bilateral hip pain. She rates her pain 5. Her current exercise regime is walking and performing stretching exercises.  Ms. Boothe Morphine  equivalent is 40.00 MME.   Last Oral Swab was Performed on 04/20/2024, it was consistent.    Pain Inventory Average Pain 5 Pain Right Now 5 My pain is sharp, burning, tingling, and aching  In the last 24 hours, has pain interfered with the following? General activity 5 Relation with others 5 Enjoyment of life 5 What TIME of day is your pain at its worst? morning , daytime, evening, and night Sleep (in general) Fair  Pain is worse with: walking, bending, sitting, standing, and some activites Pain improves with: rest, heat/ice, pacing activities, and medication Relief from Meds: 2-3  Family History  Problem Relation Age of Onset   Other Mother        complications from flu   Other Father        unsure of cause   Colon cancer Neg Hx    Social History   Socioeconomic History   Marital status: Widowed    Spouse name: Not on file   Number of children: 3   Years of education: 16 years   Highest education level: Not on file  Occupational History   Occupation: Retired  Tobacco Use   Smoking status: Former    Current packs/day: 0.00    Average packs/day: 0.5 packs/day for 20.0 years (10.0 ttl pk-yrs)    Types: Cigarettes    Start date: 02/03/1956    Quit date: 02/03/1976    Years since quitting: 48.4   Smokeless tobacco: Never  Vaping Use   Vaping status: Never Used  Substance and Sexual Activity   Alcohol use: No   Drug use: No   Sexual activity: Not Currently     Birth control/protection: Post-menopausal  Other Topics Concern   Not on file  Social History Narrative   Lives at home with husband.   Right-handed.   No caffeine use.   retired   Social Drivers of Health   Tobacco Use: Medium Risk (06/19/2024)   Patient History    Smoking Tobacco Use: Former    Smokeless Tobacco Use: Never    Passive Exposure: Not on Actuary Strain: Low Risk (09/02/2023)   Overall Financial Resource Strain (CARDIA)    Difficulty of Paying Living Expenses: Not hard at all  Food Insecurity: No Food Insecurity (09/02/2023)   Hunger Vital Sign    Worried About Running Out of Food in the Last Year: Never true    Ran Out of Food in the Last Year: Never true  Transportation Needs: No Transportation Needs (09/02/2023)   PRAPARE - Administrator, Civil Service (Medical): No    Lack of Transportation (Non-Medical): No  Physical Activity: Inactive (09/02/2023)   Exercise Vital Sign    Days of Exercise per Week: 0 days    Minutes of Exercise per Session: 0 min  Stress: No Stress Concern Present (09/02/2023)   [name] of Occupational Health - Occupational Stress Questionnaire    Feeling of Stress :  Only a little  Social Connections: Moderately Isolated (09/02/2023)   Social Connection and Isolation Panel    Frequency of Communication with Friends and Family: Twice a week    Frequency of Social Gatherings with Friends and Family: Three times a week    Attends Religious Services: More than 4 times per year    Active Member of Clubs or Organizations: No    Attends Banker Meetings: Never    Marital Status: Widowed  Depression (PHQ2-9): Low Risk (05/03/2024)   Depression (PHQ2-9)    PHQ-2 Score: 2  Alcohol Screen: Low Risk (09/02/2023)   Alcohol Screen    Last Alcohol Screening Score (AUDIT): 0  Housing: Low Risk (09/02/2023)   Housing Stability Vital Sign    Unable to Pay for Housing in the Last Year: No    Number of Times Moved  in the Last Year: 0    Homeless in the Last Year: No  Utilities: Not At Risk (09/02/2023)   AHC Utilities    Threatened with loss of utilities: No  Health Literacy: Adequate Health Literacy (09/02/2023)   B1300 Health Literacy    Frequency of need for help with medical instructions: Never   Past Surgical History:  Procedure Laterality Date   17 HOUR PH STUDY N/A 06/21/2018   Procedure: 24 HOUR PH STUDY;  Surgeon: San Sandor GAILS, DO;  Location: [facility];  Service: Gastroenterology;  Laterality: N/A;  with impedance   ABDOMINAL HYSTERECTOMY  2010   ANAL RECTAL MANOMETRY N/A 09/19/2019   Procedure: ANO RECTAL MANOMETRY;  Surgeon: Shila Gustav GAILS, MD;  Location: [facility];  Service: Endoscopy;  Laterality: N/A;   APPENDECTOMY  1949   BACK SURGERY  1973   BIOPSY  06/10/2020   Procedure: BIOPSY;  Surgeon: San Sandor GAILS, DO;  Location: [facility];  Service: Gastroenterology;;  EGD and COLON   BREAST SURGERY     CATARACT EXTRACTION Bilateral 2011   CHOLECYSTECTOMY     COLON RESECTION N/A 06/12/2020   Procedure: LAPAROSCOPIC COLON RESECTION;  Surgeon: Vernetta Berg, MD;  [facility]: [facility];  Service: General;  Laterality: N/A;   COLONOSCOPY WITH PROPOFOL  N/A 06/10/2020   Procedure: COLONOSCOPY WITH PROPOFOL ;  Surgeon: San Sandor GAILS, DO;  [facility];  Service: Gastroenterology;  Laterality: N/A;   ESOPHAGEAL MANOMETRY N/A 06/21/2018   Procedure: ESOPHAGEAL MANOMETRY (EM);  Surgeon: San Sandor GAILS, DO;  [facility];  Service: Gastroenterology;  Laterality: N/A;   ESOPHAGOGASTRODUODENOSCOPY (EGD) WITH PROPOFOL  N/A 06/10/2020   Procedure: ESOPHAGOGASTRODUODENOSCOPY (EGD) WITH PROPOFOL ;  Surgeon: San Sandor GAILS, DO;  [facility];  Service: Gastroenterology;  Laterality: N/A;   GALLBLADDER SURGERY  2015   INCISIONAL HERNIA REPAIR N/A 09/16/2021   Procedure: OPEN INCISIONAL HERNIA REPAIR WITH MESH;  Surgeon: Vernetta Berg, MD;  [facility];  Service: General;  Laterality: N/A;   JOINT REPLACEMENT     RT TOTAL HIP / RT TOTAL KNEE   MASTECTOMY  1998   BILATERAL    PH IMPEDANCE STUDY  06/21/2018   Procedure: PH IMPEDANCE STUDY;  Surgeon: San Sandor GAILS, DO;  [facility];  Service: Gastroenterology;;   POLYPECTOMY  06/10/2020   Procedure: POLYPECTOMY;  Surgeon: San Sandor GAILS, DO;  [facility];  Service: Gastroenterology;;   SKIN CANCER EXCISION  2016   RT SIDE OF NOSE   SUBMUCOSAL TATTOO INJECTION  06/10/2020   Procedure: SUBMUCOSAL TATTOO INJECTION;  Surgeon: San Sandor GAILS, DO;  [facility];  Service:  Gastroenterology;;   TONSILLECTOMY  1953   TOTAL HIP ARTHROPLASTY  2010   RIGHT   TOTAL HIP ARTHROPLASTY Left 05/09/2015   Procedure: LEFT TOTAL HIP ARTHROPLASTY ANTERIOR APPROACH;  Surgeon: Dempsey Moan, MD;  Location: [facility];  Service: Orthopedics;  Laterality: Left;   TOTAL KNEE ARTHROPLASTY  2001   TUMOR REMOVAL  2012   ABDOMINAL - NON CANCEROUS   Past Surgical History:  Procedure Laterality Date   82 HOUR PH STUDY N/A 06/21/2018   Procedure: 24 HOUR PH STUDY;  Surgeon: San Sandor GAILS, DO;  [facility];  Service: Gastroenterology;  Laterality: N/A;  with impedance   ABDOMINAL HYSTERECTOMY  2010   ANAL RECTAL MANOMETRY N/A 09/19/2019   Procedure: ANO RECTAL MANOMETRY;  Surgeon: Shila Gustav GAILS, MD;  [facility];  Service: Endoscopy;  Laterality: N/A;   APPENDECTOMY  1949   BACK SURGERY  1973   BIOPSY  06/10/2020   Procedure: BIOPSY;  Surgeon: San Sandor GAILS, DO;  [facility];  Service: Gastroenterology;;  EGD and COLON   BREAST SURGERY     CATARACT EXTRACTION Bilateral 2011   CHOLECYSTECTOMY     COLON RESECTION N/A 06/12/2020   Procedure: LAPAROSCOPIC COLON RESECTION;  Surgeon: Vernetta Berg, MD;  [facility];  Service: General;  Laterality: N/A;   COLONOSCOPY WITH PROPOFOL  N/A 06/10/2020   Procedure: COLONOSCOPY WITH  PROPOFOL ;  Surgeon: San Sandor GAILS, DO;  [facility];  Service: Gastroenterology;  Laterality: N/A;   ESOPHAGEAL MANOMETRY N/A 06/21/2018   Procedure: ESOPHAGEAL MANOMETRY (EM);  Surgeon: San Sandor GAILS, DO;  [facility];  Service: Gastroenterology;  Laterality: N/A;   ESOPHAGOGASTRODUODENOSCOPY (EGD) WITH PROPOFOL  N/A 06/10/2020   Procedure: ESOPHAGOGASTRODUODENOSCOPY (EGD) WITH PROPOFOL ;  Surgeon: San Sandor GAILS, DO;  [facility];  Service: Gastroenterology;  Laterality: N/A;   GALLBLADDER SURGERY  2015   INCISIONAL HERNIA REPAIR N/A 09/16/2021   Procedure: OPEN INCISIONAL HERNIA REPAIR WITH MESH;  Surgeon: Vernetta Berg, MD;  [facility];  Service: General;  Laterality: N/A;   JOINT REPLACEMENT     RT TOTAL HIP / RT TOTAL KNEE   MASTECTOMY  1998   BILATERAL    PH IMPEDANCE STUDY  06/21/2018   Procedure: PH IMPEDANCE STUDY;  Surgeon: San Sandor GAILS, DO;  [facility];  Service: Gastroenterology;;   POLYPECTOMY  06/10/2020   Procedure: POLYPECTOMY;  Surgeon: San Sandor GAILS, DO;  [facility];  Service: Gastroenterology;;   SKIN CANCER EXCISION  2016   RT SIDE OF NOSE   SUBMUCOSAL TATTOO INJECTION  06/10/2020   Procedure: SUBMUCOSAL TATTOO INJECTION;  Surgeon: San Sandor GAILS, DO;  [facility];  Service: Gastroenterology;;   TONSILLECTOMY  1953   TOTAL HIP ARTHROPLASTY  2010   RIGHT   TOTAL HIP ARTHROPLASTY Left 05/09/2015   Procedure: LEFT TOTAL HIP ARTHROPLASTY ANTERIOR APPROACH;  Surgeon: Dempsey Moan, MD;  [facility];  Service: Orthopedics;  Laterality: Left;   TOTAL KNEE ARTHROPLASTY  2001   TUMOR REMOVAL  2012   ABDOMINAL - NON CANCEROUS   Past Medical History:  Diagnosis Date   Acquired trigger finger of right little finger 01/15/2019   Acute blood loss anemia 06/08/2020   Acute lower GI bleeding 06/09/2020   Acute pain of right wrist 03/28/2019   Adenomatous polyp of sigmoid colon     Aftercare 08/17/2018   Arthritis    Cancer (HCC)    HX BREAST CANCER/ SKIN CANCER   Carpal tunnel  syndrome of right wrist 01/15/2019   Colon cancer [facility])    Colonic mass    Complication of anesthesia    N/V WITH MORPHINE    Constipation    Difficulty sleeping    Diverticulosis    Diverticulosis of colon without hemorrhage    Dyslipidemia 02/02/2018   Encounter for orthopedic follow-up care 06/18/2019   Fracture of superior pubic ramus (HCC) 06/16/2018   Fractured hip (HCC)    LEFT - AUG 2016   Gait abnormality 02/09/2017   Gastric polyps    GERD (gastroesophageal reflux disease)    Heartburn    History of right knee joint replacement 10/17/2017   HTN (hypertension) 06/08/2020   Hyperlipidemia    Hypertension    Incisional hernia 09/16/2021   Low back pain 02/09/2017   Lower GI bleed 06/08/2020   Lumbar post-laminectomy syndrome 07/31/2018   Malignant neoplasm of descending colon (HCC)    Melanoma (HCC)    Memory impairment 07/12/2022   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 behavioral disturbance, psychotic disturbance, mood disturbance, or anxiety (HCC) 05/05/2021   Neuropathy    Nocturia    OA (osteoarthritis) of hip 05/09/2015   Osteopenia    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   Pain 05/05/2021   Pain in both lower extremities 08/02/2017   Pain in right hand 06/10/2017   Pain of left hand 04/20/2018   Paresthesia 02/09/2017   PONV (postoperative nausea and vomiting)    PT STATES MORPHINE  CAUSED N/V   Primary osteoarthritis of left knee 10/17/2017   Radial styloid tenosynovitis of left hand 05/16/2018   Rosacea    Sleep apnea    Stage 1 breast cancer, ER+, right (HCC) 07/04/2017   Stroke-like symptoms 03/25/2021   Tenosynovitis, wrist 03/28/2019   Trigger finger of right hand 06/10/2017   Trochanteric bursitis of left hip 12/23/2017   Urinary tract infection 04/20/2019   BP 127/76 (BP Location: Left Arm, Patient Position: Sitting, Cuff Size: Large)    Pulse 74   Ht 5' 6 (1.676 m)   Wt 211 lb 9.6 oz (96 kg)   SpO2 93%   BMI 34.15 kg/m   Opioid Risk Score:   Fall Risk Score:  `1  Depression screen [facility] 2/9     05/03/2024   11:54 AM 02/16/2024   12:00 PM 12/22/2023   11:23 AM 10/27/2023   11:54 AM 10/27/2023   11:52 AM 09/02/2023    1:16 PM 09/01/2023   11:55 AM  Depression screen PHQ 2/9  Decreased Interest 0 0 0 2 0 0 0  Down, Depressed, Hopeless 0 0 0 2 0 0 0  PHQ - 2 Score 0 0 0 4 0 0 0  Altered sleeping 1  0 2     Tired, decreased energy 1  0 2     Change in appetite 0   0     Feeling bad or failure about yourself  0  0 0     Trouble concentrating 0   0     Moving slowly or fidgety/restless 0  0 0     Suicidal thoughts 0  0 0     PHQ-9 Score 2  0  8      Difficult doing work/chores Not difficult at all  Not difficult at all Somewhat difficult        Data saved with a previous flowsheet row definition      Review of Systems  Musculoskeletal:  Positive for back pain and myalgias.  Mid to lower back pain, bilateral leg pain radiating to feet  All other systems reviewed and are negative.      Objective:   Physical Exam Vitals and nursing note reviewed.  Constitutional:      Appearance: Normal appearance.  Cardiovascular:     Rate and Rhythm: Normal rate and regular rhythm.     Pulses: Normal pulses.     Heart sounds: Normal heart sounds.  Pulmonary:     Effort: Pulmonary effort is normal.     Breath sounds: Normal breath sounds.  Musculoskeletal:     Comments: Normal Muscle Bulk and Muscle Testing Reveals:  Upper Extremities: Full ROM and Muscle Strength 5/5  Lumbar Paraspinal Tenderness: L-3-L-5 Bilateral Greater Trochanter Tenderness Lower Extremities: Right: Full ROM and Muscle Strength 5/5 Left lower extremity: Decreased ROM and Muscle Strength 5/5 Left Lower Extremity Flexion Produces Pain into her Left Patella Arises from Table slowly using walker for support Narrow Based  Gait     Skin:     General: Skin is warm and dry.  Neurological:     Mental Status: She is alert and oriented to person, place, and time.  Psychiatric:        Mood and Affect: Mood normal.        Behavior: Behavior normal.          Assessment & Plan:  1.Chronic Bilateral :Low Back Pain  Leg pain: Paresthesia: Continue HEP as Tolerated. Continue to Monitor. 06/19/2024 2. Paresthesia Harrie Radiculitis: Ms. Castelluccio has weaned herself off the Lyrica  due to daytime drowsiness. We will continue to monitor. 06/19/2024 3. Pain of Left Wrist/ : No complaints today. S/P Carpal Tunnel Release on 06/03/2017 by Dr. Colon. Dr. Colon Following. 06/19/2024. 4. Fracture of superior pubic ramus.  Dr. Melodi Following.  Continue to monitor. 06/19/2024. 5. Bilateral Knee OA: Continue Voltaren  Gel.  Continue to monitor. Orthopedist following. 06/19/2024 6. Polyarthralgia: Continue to alternate with heat and ice therapy. Continue current medication regime. Continue to monitor. 06/19/2024. 7. Chronic Pain Syndrome: Refilled::Hydrocodone   10/325 one tablet 4 times a day as needed for pain #120. Second script sent for the following month.  We will continue the opioid monitoring program, this consists of regular clinic visits, examinations, urine drug screen, pill counts as well as use of [place]  Controlled Substance Reporting system. A 12 month History has been reviewed on the [place]  Controlled Substance Reporting System on 06/19/2024. 8. Lumbar Compression Fracture L2 and L3:  Ms. Herbold refused physical therapy. Continue with rest/ heat therapy. Continue to Monitor 06/19/2024. 9. Muscle Spasm: Continue  Flexeril  5 mg at HS. Continue to monitor. 06/19/2024 10. Right Shoulder Pain: No complaints today. Continue HEP as Tolerated. Alternate Ice and Heat Therapy. 06/19/2024  11. Bilateral  Greater Trochanter Bursitis: . Continue to Monitor. Continue HEP as Tolerated. 06/19/2024     F/U in 2 Months       "

## 2024-06-22 ENCOUNTER — Ambulatory Visit

## 2024-06-22 ENCOUNTER — Other Ambulatory Visit (HOSPITAL_COMMUNITY)
Admission: RE | Admit: 2024-06-22 | Discharge: 2024-06-22 | Disposition: A | Source: Ambulatory Visit | Attending: Obstetrics and Gynecology | Admitting: Obstetrics and Gynecology

## 2024-06-22 DIAGNOSIS — N898 Other specified noninflammatory disorders of vagina: Secondary | ICD-10-CM | POA: Diagnosis present

## 2024-06-22 NOTE — Progress Notes (Signed)
 Shivaun came here today for a self-cath teaching. I demonstrated on the patient using a mirror how to use a 61fr cath to drain urine from the bladder. Due to the patient limited range of motion she had a hard time even opening the labia to acces to urethraPt was able to demonstrate successfully using the catheter and verbalized understanding. Pt was given a self-cath bag with several catheters, lubricant, a mirror, measuring hat.

## 2024-06-25 LAB — CERVICOVAGINAL ANCILLARY ONLY
Bacterial Vaginitis (gardnerella): NEGATIVE
Candida Glabrata: NEGATIVE
Candida Vaginitis: NEGATIVE
Comment: NEGATIVE
Comment: NEGATIVE
Comment: NEGATIVE

## 2024-06-28 ENCOUNTER — Ambulatory Visit: Admitting: Obstetrics and Gynecology

## 2024-07-04 ENCOUNTER — Encounter: Payer: Self-pay | Admitting: Hematology & Oncology

## 2024-07-04 ENCOUNTER — Inpatient Hospital Stay

## 2024-07-04 ENCOUNTER — Other Ambulatory Visit: Payer: Self-pay

## 2024-07-04 ENCOUNTER — Inpatient Hospital Stay: Admitting: Hematology & Oncology

## 2024-07-04 VITALS — BP 117/48 | HR 66 | Temp 98.2°F | Resp 19 | Wt 209.0 lb

## 2024-07-04 DIAGNOSIS — Z170 Estrogen receptor positive status [ER+]: Secondary | ICD-10-CM | POA: Diagnosis not present

## 2024-07-04 DIAGNOSIS — C186 Malignant neoplasm of descending colon: Secondary | ICD-10-CM | POA: Diagnosis not present

## 2024-07-04 DIAGNOSIS — M8589 Other specified disorders of bone density and structure, multiple sites: Secondary | ICD-10-CM

## 2024-07-04 DIAGNOSIS — M818 Other osteoporosis without current pathological fracture: Secondary | ICD-10-CM

## 2024-07-04 DIAGNOSIS — C50911 Malignant neoplasm of unspecified site of right female breast: Secondary | ICD-10-CM

## 2024-07-04 LAB — CMP (CANCER CENTER ONLY)
ALT: 18 U/L (ref 0–44)
AST: 28 U/L (ref 15–41)
Albumin: 4.3 g/dL (ref 3.5–5.0)
Alkaline Phosphatase: 115 U/L (ref 38–126)
Anion gap: 12 (ref 5–15)
BUN: 15 mg/dL (ref 8–23)
CO2: 24 mmol/L (ref 22–32)
Calcium: 10.1 mg/dL (ref 8.9–10.3)
Chloride: 104 mmol/L (ref 98–111)
Creatinine: 0.6 mg/dL (ref 0.44–1.00)
GFR, Estimated: >60 mL/min
Glucose, Bld: 101 mg/dL — ABNORMAL HIGH (ref 70–99)
Potassium: 4.3 mmol/L (ref 3.5–5.1)
Sodium: 140 mmol/L (ref 135–145)
Total Bilirubin: 0.4 mg/dL (ref 0.0–1.2)
Total Protein: 7.4 g/dL (ref 6.5–8.1)

## 2024-07-04 LAB — CBC WITH DIFFERENTIAL (CANCER CENTER ONLY)
Abs Immature Granulocytes: 0.02 10*3/uL (ref 0.00–0.07)
Basophils Absolute: 0 10*3/uL (ref 0.0–0.1)
Basophils Relative: 1 %
Eosinophils Absolute: 0.3 10*3/uL (ref 0.0–0.5)
Eosinophils Relative: 6 %
HCT: 41.1 % (ref 36.0–46.0)
Hemoglobin: 13.4 g/dL (ref 12.0–15.0)
Immature Granulocytes: 0 %
Lymphocytes Relative: 34 %
Lymphs Abs: 1.9 10*3/uL (ref 0.7–4.0)
MCH: 29.5 pg (ref 26.0–34.0)
MCHC: 32.6 g/dL (ref 30.0–36.0)
MCV: 90.3 fL (ref 80.0–100.0)
Monocytes Absolute: 0.5 10*3/uL (ref 0.1–1.0)
Monocytes Relative: 9 %
Neutro Abs: 2.9 10*3/uL (ref 1.7–7.7)
Neutrophils Relative %: 50 %
Platelet Count: 129 10*3/uL — ABNORMAL LOW (ref 150–400)
RBC: 4.55 MIL/uL (ref 3.87–5.11)
RDW: 12.6 % (ref 11.5–15.5)
WBC Count: 5.7 10*3/uL (ref 4.0–10.5)
nRBC: 0 % (ref 0.0–0.2)

## 2024-07-04 MED ORDER — ZOLEDRONIC ACID 4 MG/100ML IV SOLN
4.0000 mg | Freq: Once | INTRAVENOUS | Status: AC
  Administered 2024-07-04: 4 mg via INTRAVENOUS
  Filled 2024-07-04: qty 100

## 2024-07-04 MED ORDER — SODIUM CHLORIDE 0.9 % IV SOLN: Freq: Once | INTRAVENOUS | Status: AC
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
## 2024-07-04 NOTE — Patient Instructions (Signed)

## 2024-07-06 LAB — CEA: CEA: 4 ng/mL (ref 0.0–4.7)

## 2024-08-03 ENCOUNTER — Ambulatory Visit: Admitting: Obstetrics and Gynecology

## 2024-08-14 ENCOUNTER — Encounter: Admitting: Registered Nurse

## 2024-09-04 ENCOUNTER — Ambulatory Visit

## 2024-11-07 ENCOUNTER — Ambulatory Visit: Admitting: Cardiology

## 2024-12-04 ENCOUNTER — Ambulatory Visit: Payer: Self-pay | Admitting: Physician Assistant

## 2025-01-02 ENCOUNTER — Inpatient Hospital Stay

## 2025-01-02 ENCOUNTER — Inpatient Hospital Stay: Admitting: Hematology & Oncology
